# Patient Record
Sex: Female | Born: 1955 | Race: Black or African American | Hispanic: No | Marital: Married | Smoking: Never smoker
Health system: Southern US, Community
[De-identification: ages and names within clinical notes are randomized; demographics above are authoritative.]

## PROBLEM LIST (undated history)

## (undated) DIAGNOSIS — I639 Cerebral infarction, unspecified: Secondary | ICD-10-CM

## (undated) DIAGNOSIS — I5042 Chronic combined systolic (congestive) and diastolic (congestive) heart failure: Secondary | ICD-10-CM

## (undated) DIAGNOSIS — I10 Essential (primary) hypertension: Secondary | ICD-10-CM

## (undated) DIAGNOSIS — N184 Chronic kidney disease, stage 4 (severe): Secondary | ICD-10-CM

## (undated) DIAGNOSIS — K219 Gastro-esophageal reflux disease without esophagitis: Secondary | ICD-10-CM

## (undated) DIAGNOSIS — E119 Type 2 diabetes mellitus without complications: Secondary | ICD-10-CM

## (undated) DIAGNOSIS — Z973 Presence of spectacles and contact lenses: Secondary | ICD-10-CM

## (undated) DIAGNOSIS — E785 Hyperlipidemia, unspecified: Secondary | ICD-10-CM

## (undated) DIAGNOSIS — R011 Cardiac murmur, unspecified: Secondary | ICD-10-CM

## (undated) DIAGNOSIS — D649 Anemia, unspecified: Secondary | ICD-10-CM

## (undated) DIAGNOSIS — E113599 Type 2 diabetes mellitus with proliferative diabetic retinopathy without macular edema, unspecified eye: Secondary | ICD-10-CM

## (undated) DIAGNOSIS — H409 Unspecified glaucoma: Secondary | ICD-10-CM

## (undated) DIAGNOSIS — I2510 Atherosclerotic heart disease of native coronary artery without angina pectoris: Secondary | ICD-10-CM

## (undated) DIAGNOSIS — N183 Chronic kidney disease, stage 3 (moderate): Secondary | ICD-10-CM

## (undated) DIAGNOSIS — I509 Heart failure, unspecified: Secondary | ICD-10-CM

## (undated) DIAGNOSIS — H269 Unspecified cataract: Secondary | ICD-10-CM

## (undated) HISTORY — PX: CORONARY ANGIOPLASTY: SHX604

## (undated) HISTORY — DX: Hyperlipidemia, unspecified: E78.5

## (undated) HISTORY — DX: Unspecified glaucoma: H40.9

## (undated) HISTORY — DX: Unspecified cataract: H26.9

## (undated) HISTORY — PX: CORONARY ANGIOPLASTY WITH STENT PLACEMENT: SHX49

## (undated) HISTORY — PX: MULTIPLE TOOTH EXTRACTIONS: SHX2053

## (undated) HISTORY — PX: EYE SURGERY: SHX253

## (undated) HISTORY — DX: Cerebral infarction, unspecified: I63.9

## (undated) HISTORY — PX: COLONOSCOPY W/ BIOPSIES AND POLYPECTOMY: SHX1376

---

## 1978-08-10 HISTORY — PX: TUBAL LIGATION: SHX77

## 1997-08-10 HISTORY — PX: VAGINAL HYSTERECTOMY: SUR661

## 2011-05-15 ENCOUNTER — Other Ambulatory Visit (HOSPITAL_COMMUNITY): Payer: Self-pay | Admitting: Internal Medicine

## 2011-05-15 DIAGNOSIS — Z1231 Encounter for screening mammogram for malignant neoplasm of breast: Secondary | ICD-10-CM

## 2011-05-25 ENCOUNTER — Ambulatory Visit (HOSPITAL_COMMUNITY)
Admission: RE | Admit: 2011-05-25 | Discharge: 2011-05-25 | Disposition: A | Discharge status: Home / Self Care | Payer: Self-pay | Source: Ambulatory Visit | Attending: Internal Medicine | Admitting: Internal Medicine

## 2011-05-25 DIAGNOSIS — Z1231 Encounter for screening mammogram for malignant neoplasm of breast: Secondary | ICD-10-CM

## 2011-07-03 ENCOUNTER — Other Ambulatory Visit: Payer: Self-pay | Admitting: Internal Medicine

## 2011-07-03 DIAGNOSIS — R928 Other abnormal and inconclusive findings on diagnostic imaging of breast: Secondary | ICD-10-CM

## 2011-07-07 ENCOUNTER — Other Ambulatory Visit: Payer: Self-pay | Admitting: Internal Medicine

## 2011-07-07 DIAGNOSIS — R928 Other abnormal and inconclusive findings on diagnostic imaging of breast: Secondary | ICD-10-CM

## 2011-07-13 ENCOUNTER — Ambulatory Visit
Admission: RE | Admit: 2011-07-13 | Discharge: 2011-07-13 | Disposition: A | Discharge status: Home / Self Care | Payer: No Typology Code available for payment source | Source: Ambulatory Visit | Attending: Internal Medicine | Admitting: Internal Medicine

## 2011-07-13 DIAGNOSIS — R928 Other abnormal and inconclusive findings on diagnostic imaging of breast: Secondary | ICD-10-CM

## 2011-11-01 ENCOUNTER — Emergency Department (HOSPITAL_COMMUNITY)
Admission: EM | Admit: 2011-11-01 | Discharge: 2011-11-01 | Disposition: A | Discharge status: Home / Self Care | Payer: Self-pay | Attending: Emergency Medicine | Admitting: Emergency Medicine

## 2011-11-01 ENCOUNTER — Encounter (HOSPITAL_COMMUNITY): Payer: Self-pay

## 2011-11-01 DIAGNOSIS — R079 Chest pain, unspecified: Secondary | ICD-10-CM | POA: Insufficient documentation

## 2011-11-01 DIAGNOSIS — L02219 Cutaneous abscess of trunk, unspecified: Secondary | ICD-10-CM | POA: Insufficient documentation

## 2011-11-01 DIAGNOSIS — I10 Essential (primary) hypertension: Secondary | ICD-10-CM | POA: Insufficient documentation

## 2011-11-01 DIAGNOSIS — E119 Type 2 diabetes mellitus without complications: Secondary | ICD-10-CM | POA: Insufficient documentation

## 2011-11-01 DIAGNOSIS — L0291 Cutaneous abscess, unspecified: Secondary | ICD-10-CM

## 2011-11-01 DIAGNOSIS — L03319 Cellulitis of trunk, unspecified: Secondary | ICD-10-CM | POA: Insufficient documentation

## 2011-11-01 HISTORY — DX: Essential (primary) hypertension: I10

## 2011-11-01 MED ORDER — SULFAMETHOXAZOLE-TRIMETHOPRIM 800-160 MG PO TABS: 1.0000 | ORAL_TABLET | Freq: Two times a day (BID) | ORAL | Status: AC

## 2011-11-01 MED ORDER — LIDOCAINE-EPINEPHRINE (PF) 1 %-1:200000 IJ SOLN
INTRAMUSCULAR | Status: AC
  Administered 2011-11-01: 18:00:00
  Filled 2011-11-01: qty 10

## 2011-11-01 MED ORDER — OXYCODONE-ACETAMINOPHEN 5-325 MG PO TABS: 2.0000 | ORAL_TABLET | ORAL | Status: AC | PRN

## 2011-11-01 NOTE — Discharge Instructions (Signed)
Remove the packing in 2 days--return here for any problems  Abscess An abscess (boil or furuncle) is an infected area that contains a collection of pus.  SYMPTOMS Signs and symptoms of an abscess include pain, tenderness, redness, or hardness. You may feel a moveable soft area under your skin. An abscess can occur anywhere in the body.  TREATMENT  A surgical cut (incision) may be made over your abscess to drain the pus. Gauze may be packed into the space or a drain may be looped through the abscess cavity (pocket). This provides a drain that will allow the cavity to heal from the inside outwards. The abscess may be painful for a few days, but should feel much better if it was drained.  Your abscess, if seen early, may not have localized and may not have been drained. If not, another appointment may be required if it does not get better on its own or with medications. HOME CARE INSTRUCTIONS   Only take over-the-counter or prescription medicines for pain, discomfort, or fever as directed by your caregiver.   Take your antibiotics as directed if they were prescribed. Finish them even if you start to feel better.   Keep the skin and clothes clean around your abscess.   If the abscess was drained, you will need to use gauze dressing to collect any draining pus. Dressings will typically need to be changed 3 or more times a day.   The infection may spread by skin contact with others. Avoid skin contact as much as possible.   Practice good hygiene. This includes regular hand washing, cover any draining skin lesions, and do not share personal care items.   If you participate in sports, do not share athletic equipment, towels, whirlpools, or personal care items. Shower after every practice or tournament.   If a draining area cannot be adequately covered:   Do not participate in sports.   Children should not participate in day care until the wound has healed or drainage stops.   If your caregiver  has given you a follow-up appointment, it is very important to keep that appointment. Not keeping the appointment could result in a much worse infection, chronic or permanent injury, pain, and disability. If there is any problem keeping the appointment, you must call back to this facility for assistance.  SEEK MEDICAL CARE IF:   You develop increased pain, swelling, redness, drainage, or bleeding in the wound site.   You develop signs of generalized infection including muscle aches, chills, fever, or a general ill feeling.   You have an oral temperature above 102 F (38.9 C).  MAKE SURE YOU:   Understand these instructions.   Will watch your condition.   Will get help right away if you are not doing well or get worse.  Document Released: 05/06/2005 Document Revised: 07/16/2011 Document Reviewed: 02/28/2008 [facility] Patient Information 2012 [place].

## 2011-11-01 NOTE — ED Notes (Signed)
Patient reports abcess w/o drainage on chest at sternum x 4 days. Reports hx of same

## 2011-11-01 NOTE — ED Provider Notes (Signed)
History     CSN: [id number]  Arrival date & time 11/01/11  1537   First MD Initiated Contact with Patient 11/01/11 1642      Chief Complaint  Patient presents with  . Wound Infection    (Consider location/radiation/quality/duration/timing/severity/associated sxs/prior treatment) The history is provided by the patient.   Patient here with subxphoid wound abscess without drainage x4 days. No fever. No treatment done prior to arrival. Pain described as sharp and worse with movement. History of same in past.  Past Medical History  Diagnosis Date  . Diabetes mellitus   . Hypertension     History reviewed. No pertinent past surgical history.  No family history on file.  History  Substance Use Topics  . Smoking status: Not on file  . Smokeless tobacco: Not on file  . Alcohol Use:     OB History    Grav Para Term Preterm Abortions TAB SAB Ect Mult Living                  Review of Systems  All other systems reviewed and are negative.    Allergies  Review of patient's allergies indicates not on file.  Home Medications  No current outpatient prescriptions on file.  BP 193/86  Pulse 89  Temp(Src) 98.2 F (36.8 C) (Oral)  Resp 18  SpO2 99%  Physical Exam  Nursing note and vitals reviewed. Constitutional: She is oriented to person, place, and time. She appears well-developed and well-nourished.  Non-toxic appearance. No distress.  HENT:  Head: Normocephalic and atraumatic.  Eyes: Conjunctivae, EOM and lids are normal. Pupils are equal, round, and reactive to light.  Neck: Normal range of motion. Neck supple. No tracheal deviation present. No mass present.  Cardiovascular: Normal rate, regular rhythm and normal heart sounds.  Exam reveals no gallop.   No murmur heard. Pulmonary/Chest: Effort normal and breath sounds normal. No stridor. No respiratory distress. She has no decreased breath sounds. She has no wheezes. She has no rhonchi. She has no rales.       6cm  circular abscess to subxphoid with erythema and soft center without drainage with some surrounding erythema  Abdominal: Soft. Normal appearance and bowel sounds are normal. She exhibits no distension. There is no tenderness. There is no rebound and no CVA tenderness.  Musculoskeletal: Normal range of motion. She exhibits no edema and no tenderness.  Neurological: She is alert and oriented to person, place, and time. She has normal strength. No cranial nerve deficit or sensory deficit. GCS eye subscore is 4. GCS verbal subscore is 5. GCS motor subscore is 6.  Skin: Skin is warm and dry. No abrasion and no rash noted.  Psychiatric: She has a normal mood and affect. Her speech is normal and behavior is normal.    ED Course  INCISION AND DRAINAGE Date/Time: 11/01/2011 5:18 PM Performed by: Leota Jacobsen Authorized by: Lacretia Leigh T Consent: Verbal consent obtained. Risks and benefits: risks, benefits and alternatives were discussed Consent given by: patient Patient identity confirmed: verbally with patient Time out: Immediately prior to procedure a "time out" was called to verify the correct patient, procedure, equipment, support staff and site/side marked as required. Type: abscess Body area: trunk Location details: chest Anesthesia: local infiltration Local anesthetic: lidocaine 2% with epinephrine Anesthetic total: 10 ml Patient sedated: no Scalpel size: 10 Incision type: single straight Complexity: complex Drainage: purulent Drainage amount: copious Wound treatment: wound left open Packing material: 1/4 in iodoform gauze Patient tolerance:  Patient tolerated the procedure well with no immediate complications.   (including critical care time)  Labs Reviewed - No data to display No results found.   No diagnosis found.    MDM  Pt to have tetanus status updated and will be given rx for pain meds, abx, and f/u instructions        Leota Jacobsen, MD 11/01/11  1720

## 2012-01-26 ENCOUNTER — Other Ambulatory Visit: Payer: Self-pay | Admitting: Internal Medicine

## 2012-01-26 DIAGNOSIS — N6009 Solitary cyst of unspecified breast: Secondary | ICD-10-CM

## 2012-02-02 ENCOUNTER — Other Ambulatory Visit: Payer: Self-pay

## 2012-02-16 ENCOUNTER — Ambulatory Visit
Admission: RE | Admit: 2012-02-16 | Discharge: 2012-02-16 | Disposition: A | Discharge status: Home / Self Care | Payer: Self-pay | Source: Ambulatory Visit | Attending: Internal Medicine | Admitting: Internal Medicine

## 2012-02-16 DIAGNOSIS — N6009 Solitary cyst of unspecified breast: Secondary | ICD-10-CM

## 2013-02-03 ENCOUNTER — Encounter (HOSPITAL_COMMUNITY): Payer: Self-pay | Admitting: *Deleted

## 2013-02-03 ENCOUNTER — Emergency Department (HOSPITAL_COMMUNITY)
Admission: EM | Admit: 2013-02-03 | Discharge: 2013-02-03 | Disposition: A | Discharge status: Home / Self Care | Payer: BC Managed Care – PPO | Source: Home / Self Care | Attending: Family Medicine | Admitting: Family Medicine

## 2013-02-03 ENCOUNTER — Emergency Department (INDEPENDENT_AMBULATORY_CARE_PROVIDER_SITE_OTHER): Payer: BC Managed Care – PPO

## 2013-02-03 DIAGNOSIS — M545 Low back pain, unspecified: Secondary | ICD-10-CM

## 2013-02-03 DIAGNOSIS — IMO0001 Reserved for inherently not codable concepts without codable children: Secondary | ICD-10-CM

## 2013-02-03 DIAGNOSIS — R0602 Shortness of breath: Secondary | ICD-10-CM

## 2013-02-03 DIAGNOSIS — IMO0002 Reserved for concepts with insufficient information to code with codable children: Secondary | ICD-10-CM

## 2013-02-03 DIAGNOSIS — E1165 Type 2 diabetes mellitus with hyperglycemia: Secondary | ICD-10-CM

## 2013-02-03 DIAGNOSIS — N390 Urinary tract infection, site not specified: Secondary | ICD-10-CM

## 2013-02-03 LAB — POCT I-STAT, CHEM 8
BUN: 10 mg/dL (ref 6–23)
Calcium, Ion: 1.19 mmol/L (ref 1.12–1.23)
HCT: 42 % (ref 36.0–46.0)
Sodium: 139 mEq/L (ref 135–145)
TCO2: 28 mmol/L (ref 0–100)

## 2013-02-03 LAB — POCT URINALYSIS DIP (DEVICE)
Bilirubin Urine: NEGATIVE
Nitrite: NEGATIVE
Specific Gravity, Urine: 1.015 (ref 1.005–1.030)
Urobilinogen, UA: 0.2 mg/dL (ref 0.0–1.0)
pH: 6 (ref 5.0–8.0)

## 2013-02-03 MED ORDER — LISINOPRIL-HYDROCHLOROTHIAZIDE 20-12.5 MG PO TABS: 2.0000 | ORAL_TABLET | Freq: Every day | ORAL | Status: DC

## 2013-02-03 MED ORDER — METFORMIN HCL 1000 MG PO TABS: 500.0000 mg | ORAL_TABLET | Freq: Two times a day (BID) | ORAL | Status: DC

## 2013-02-03 MED ORDER — TRAMADOL HCL 50 MG PO TABS: 50.0000 mg | ORAL_TABLET | Freq: Four times a day (QID) | ORAL | Status: DC | PRN

## 2013-02-03 MED ORDER — SULFAMETHOXAZOLE-TRIMETHOPRIM 800-160 MG PO TABS: 1.0000 | ORAL_TABLET | Freq: Two times a day (BID) | ORAL | Status: DC

## 2013-02-03 MED ORDER — METFORMIN HCL 1000 MG PO TABS: 1000.0000 mg | ORAL_TABLET | Freq: Two times a day (BID) | ORAL | Status: DC

## 2013-02-03 NOTE — Discharge Instructions (Signed)
[place]: [place]  [phone]  [place] [facility]: [place]   [phone]  [facility]: [address] [place]  [phone]  [place] [facility] : [address] [place] [phone]  [facility]: [address] [place] [phone]  [facility]: [place] [phone]  Dr. Blanchie Serve [place]  [phone]  Dr. Benito Mccreedy, [facility]. [place], [place] [address]  [phone]    Go to www.goodrx.com to look up your medications. This will give you a list of where you can find your prescriptions at the most affordable prices.   If you have no primary doctor, here are some resources that may be helpful:  [place] Providers: - [facility]- [address]  [phone];   - [place], [place] [phone]  - [place], [address] [phone] - [place]  [phone]  - Lucianne Lei- [address], [address] [phone]  Only accepts [place] patients       after they have her name applied to their card  -Dr. Benito Mccreedy, [facility]. [place]    [place], [place] [address]  [phone]  Self Pay (no insurance) in [place]: - Sickle Cell Patients: Dr Kevan Ny, [facility] [place]  - Physician Referral Service- [facility]- [address], [address], [place], [phone];  Monday to Friday, 9 a.m. - 7 p.m.; Saturday 9 a.m. to 1 p.m.  [facility]- [address] [place], [place]      [facility]- [address] [phone]  -[facility], [place]; [phone]; or [address], [place]; [phone].   [employer] [facility] [place], [address] [place] [address] [place]; [id number]; Monday to Wednesday, 8:30 a.m. - 5 p.m.; Thursday, 8:30 a.m. - 8 p.m.  [facility], [place], [place]; [id number]; Monday to Friday, 8 a.m. - 4:30 p.m.   [facility], [place] [address], [place], [place]; first and third Saturday of the month, 9:30 a.m. - 12:30 p.m.  [facility], [address], [place], [place]; second Saturday of the month, 9 a.m. -noon.  [place] for children. For information, call [phone]; [phone]; or [phone].  Other agencies that provide inexpensive medical care:     [name]  [facility]  [phone]    [facility]  [phone] [place]    [facility]  [phone]    [facility]  [phone], [phone]; or [phone].  Chronic Pain Problems Contact [facility]  [phone] Patients need to be referred by their primary care doctor.  [facility] [place]  [place] [address] [place]   [phone] (After Hours)  General Information: Finding a doctor when you do not have health insurance can be tricky. Although you are not limited by an insurance plan, you are of course limited by her finances  and how much but he can pay out of pocket.  What are your options if you don't have health insurance?   1) Find a Tax adviser and Pay Out of [place] Although you won't have to find out who is covered by your insurance plan, it is a good idea to ask around and get recommendations. You will then need to call the office and see if the doctor you have chosen will accept you as a new patient and what types of options they offer for patients who are self-pay. Some doctors offer discounts or will set up payment plans for their patients who do not have insurance, but you will need to ask so you aren't surprised when you get to your appointment.  2) Contact Your Local Health Department Not all health departments have doctors that can see patients for sick visits, but many do, so it is worth a call to see if yours does. If you don't know where your local health department is, you can check in your phone book. The CDC also has a tool to help you locate your state's health department, and many state websites also have listings of all of their local health departments.  3) Find a [facility] If your illness is not likely to be very severe or complicated, you may want to try a walk in clinic. These are popping up all over the country in pharmacies, drugstores, and shopping centers. They're usually staffed by nurse practitioners or physician assistants that have been trained to treat common illnesses and complaints. They're usually fairly quick and inexpensive. However, if you have serious medical issues or chronic medical problems, these are probably not your best option

## 2013-02-03 NOTE — ED Provider Notes (Signed)
History    CSN: [id number] Arrival date & time 02/03/13  1021  First MD Initiated Contact with Patient 02/03/13 1050     Chief Complaint  Patient presents with  . 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 His low back pain has been going on for a couple of months and seems to be getting a bit worse. The pain is confined to the lower back and does not radiate down the legs. She does endorse a bit of intermittent feet swelling after a long day of work. She also admits to some burning with urination in the past 2 weeks. The shortness of breath as described more as heavy breathing and is worse with laying down. She says it is worse when she drinks some water before she goes to bed. She denies ever waking up in the middle of the night short of breath. Denies any history of heart failure. None of her problems have changed or gotten any worse in the last couple of weeks, she states she has just gotten tired of the pain in her shoulders and back  Patient is a 57 y.o. female presenting with back pain.  Back Pain Associated symptoms: dysuria and numbness (has some intermittent numbness in her toes after prolonged standing)   Associated symptoms: no abdominal pain, no chest pain, no fever and no weakness    Past Medical History  Diagnosis Date  . Diabetes mellitus   . Hypertension    History reviewed. No pertinent past surgical history. History reviewed. No pertinent family history. History  Substance Use Topics  . Smoking status: Never Smoker   . Smokeless tobacco: Not on file  . Alcohol Use: No   OB History   Grav Para Term Preterm Abortions TAB SAB Ect Mult  Living                 Review of Systems  Constitutional: Negative for fever and chills.  Eyes: Negative for visual disturbance.  Respiratory: Positive for shortness of breath. Negative for cough, choking and chest tightness.   Cardiovascular: Positive for leg swelling. Negative for chest pain and palpitations.       History of heart murmur  Gastrointestinal: Negative for nausea, vomiting, abdominal pain, diarrhea and blood in stool.  Endocrine: Positive for polydipsia and polyuria.  Genitourinary: Positive for dysuria. Negative for urgency and frequency.  Musculoskeletal: Positive for back pain. Negative for myalgias and arthralgias.  Skin: Negative for rash.  Neurological: Positive for numbness (has some intermittent numbness in her toes after prolonged standing). Negative for dizziness, weakness and light-headedness.    Allergies  Review of patient's allergies indicates no known allergies.  Home Medications   Current Outpatient Rx  Name  Route  Sig  Dispense  Refill  . OVER THE COUNTER MEDICATION   Apply externally   Apply 1 application topically at bedtime. Apply to boil nightly at bedtime         . sulfamethoxazole-trimethoprim (SEPTRA DS) 800-160 MG per tablet   Oral   Take 1 tablet by mouth every 12 (twelve) hours.   14 tablet   0   . traMADol (ULTRAM) 50 MG tablet   Oral   Take 1 tablet (50 mg total) by mouth  every 6 (six) hours as needed for pain.   30 tablet   0    BP 202/81  Pulse 78  Temp(Src) 98.6 F (37 C) (Oral)  Resp 14  SpO2 99% Physical Exam  Nursing note and vitals reviewed. Constitutional: She is oriented to person, place, and time. Vital signs are normal. She appears well-developed and well-nourished. No distress.  HENT:  Head: Atraumatic.  Eyes: EOM are normal. Pupils are equal, round, and reactive to light.  Cardiovascular: Normal rate and regular rhythm.  Exam reveals no gallop and no friction rub.   Murmur heard.  Decrescendo systolic  murmur is present with a grade of 1/6  Grade 1/6 early decrescendo murmur loudest in the left upper sternal border second intercostal status, nonradiating  Pulmonary/Chest: Effort normal and breath sounds normal. No respiratory distress. She has no wheezes. She has no rales.  Abdominal: Soft. There is no tenderness.  Musculoskeletal: Normal range of motion. She exhibits no edema.       Lumbar back: She exhibits normal range of motion, no tenderness, no bony tenderness, no deformity and no spasm.  Neurological: She is alert and oriented to person, place, and time. She has normal strength.  Skin: Skin is warm and dry. She is not diaphoretic.  Psychiatric: She has a normal mood and affect. Her behavior is normal. Judgment normal.    ED Course  Procedures (including critical care time) Labs Reviewed  POCT URINALYSIS DIP (DEVICE) - Abnormal; Notable for the following:    Glucose, UA 500 (*)    Hgb urine dipstick TRACE (*)    Leukocytes, UA TRACE (*)    All other components within normal limits  POCT I-STAT, CHEM 8 - Abnormal; Notable for the following:    Glucose, Bld 378 (*)    All other components within normal limits  URINE CULTURE   Dg Chest 2 View  02/03/2013   *RADIOLOGY REPORT*  Clinical Data: Back pain.  CHEST - 2 VIEW  Comparison: None.  Findings: The heart size and pulmonary vascularity are normal and the lungs are clear.  No acute osseous abnormality.  IMPRESSION: Normal exam.   Original Report Authenticated By: Lorriane Shire, M.D.   1. Lumbago   2. UTI (lower urinary tract infection)   3. Diabetes mellitus type II, uncontrolled     MDM  The back and shoulder pain seems to be primarily musculoskeletal, although she does have some symptoms of a mild lower urinary tract infection so this will be treated with Bactrim. She'll followup with her primary care physician control of her diabetes and hypertension. She took her antihypertensive medication only when she was on the way here  today which may explain the high blood pressure reading, with she will take this up with primary care physician              . sulfamethoxazole-trimethoprim (SEPTRA DS) 800-160 MG per tablet    Sig: Take 1 tablet by mouth every 12 (twelve) hours.    Dispense:  14 tablet    Refill:  0  . traMADol (ULTRAM) 50 MG tablet    Sig: Take 1 tablet (50 mg total) by mouth every 6 (six) hours as needed for pain.    Dispense:  30 tablet    Refill:  0     Liam Graham, PA-C 02/03/13 1158

## 2013-02-03 NOTE — ED Notes (Signed)
Pt  Reports    Low  Back  Pain       For  sev  Weeks      denys  Any  Injury however  Reports    Some  Frequency  Of  Urine  As  Well          Pt is  Diabetic  Who  Also  Has  History  Of  Hypertension as  Well    She  Reports  She  Took  Her  meds  today

## 2013-02-04 LAB — URINE CULTURE

## 2013-02-04 NOTE — ED Provider Notes (Signed)
Medical screening examination/treatment/procedure(s) were performed by non-physician practitioner and as supervising physician I was immediately available for consultation/collaboration.   [facility]; MD  Randa Spike, MD 02/04/13 [phone]

## 2013-04-12 ENCOUNTER — Emergency Department (HOSPITAL_COMMUNITY)
Admission: EM | Admit: 2013-04-12 | Discharge: 2013-04-12 | Disposition: A | Discharge status: Home / Self Care | Payer: BC Managed Care – PPO | Attending: Emergency Medicine | Admitting: Emergency Medicine

## 2013-04-12 ENCOUNTER — Encounter (HOSPITAL_COMMUNITY): Payer: Self-pay | Admitting: Emergency Medicine

## 2013-04-12 DIAGNOSIS — Z794 Long term (current) use of insulin: Secondary | ICD-10-CM | POA: Insufficient documentation

## 2013-04-12 DIAGNOSIS — I10 Essential (primary) hypertension: Secondary | ICD-10-CM | POA: Insufficient documentation

## 2013-04-12 DIAGNOSIS — E119 Type 2 diabetes mellitus without complications: Secondary | ICD-10-CM | POA: Insufficient documentation

## 2013-04-12 DIAGNOSIS — R000 Tachycardia, unspecified: Secondary | ICD-10-CM | POA: Insufficient documentation

## 2013-04-12 DIAGNOSIS — R5381 Other malaise: Secondary | ICD-10-CM | POA: Insufficient documentation

## 2013-04-12 DIAGNOSIS — R52 Pain, unspecified: Secondary | ICD-10-CM | POA: Insufficient documentation

## 2013-04-12 DIAGNOSIS — R739 Hyperglycemia, unspecified: Secondary | ICD-10-CM

## 2013-04-12 DIAGNOSIS — Z79899 Other long term (current) drug therapy: Secondary | ICD-10-CM | POA: Insufficient documentation

## 2013-04-12 DIAGNOSIS — R112 Nausea with vomiting, unspecified: Secondary | ICD-10-CM | POA: Insufficient documentation

## 2013-04-12 DIAGNOSIS — R630 Anorexia: Secondary | ICD-10-CM | POA: Insufficient documentation

## 2013-04-12 LAB — GLUCOSE, CAPILLARY
Glucose-Capillary: 319 mg/dL — ABNORMAL HIGH (ref 70–99)
Glucose-Capillary: 236 mg/dL — ABNORMAL HIGH (ref 70–99)

## 2013-04-12 LAB — CBC
MCV: 73.5 fL — ABNORMAL LOW (ref 78.0–100.0)
Platelets: 383 10*3/uL (ref 150–400)
RBC: 5.36 MIL/uL — ABNORMAL HIGH (ref 3.87–5.11)
RDW: 12.9 % (ref 11.5–15.5)
WBC: 8.1 10*3/uL (ref 4.0–10.5)

## 2013-04-12 LAB — COMPREHENSIVE METABOLIC PANEL
ALT: 9 U/L (ref 0–35)
AST: 11 U/L (ref 0–37)
Albumin: 3.7 g/dL (ref 3.5–5.2)
Alkaline Phosphatase: 121 U/L — ABNORMAL HIGH (ref 39–117)
CO2: 30 mEq/L (ref 19–32)
Chloride: 93 mEq/L — ABNORMAL LOW (ref 96–112)
GFR calc non Af Amer: 66 mL/min — ABNORMAL LOW (ref >90)
Potassium: 3.8 mEq/L (ref 3.5–5.1)
Sodium: 134 mEq/L — ABNORMAL LOW (ref 135–145)
Total Bilirubin: 0.3 mg/dL (ref 0.3–1.2)

## 2013-04-12 LAB — URINALYSIS, ROUTINE W REFLEX MICROSCOPIC
Glucose, UA: >1000 mg/dL — AB
Hgb urine dipstick: NEGATIVE
Ketones, ur: NEGATIVE mg/dL
Protein, ur: 30 mg/dL — AB
Urobilinogen, UA: 1 mg/dL (ref 0.0–1.0)

## 2013-04-12 LAB — URINE MICROSCOPIC-ADD ON

## 2013-04-12 MED ORDER — SODIUM CHLORIDE 0.9 % IV BOLUS (SEPSIS)
1000.0000 mL | Freq: Once | INTRAVENOUS | Status: AC
  Administered 2013-04-12: 1000 mL via INTRAVENOUS

## 2013-04-12 MED ORDER — ONDANSETRON HCL 4 MG/2ML IJ SOLN
4.0000 mg | Freq: Once | INTRAMUSCULAR | Status: AC
  Administered 2013-04-12: 4 mg via INTRAVENOUS
  Filled 2013-04-12: qty 2

## 2013-04-12 MED ORDER — ONDANSETRON HCL 4 MG PO TABS: 4.0000 mg | ORAL_TABLET | Freq: Four times a day (QID) | ORAL | Status: DC

## 2013-04-12 NOTE — Progress Notes (Signed)
Patient confirms her pcp is Dr. Lucianne Lei.

## 2013-04-12 NOTE — ED Notes (Signed)
Pt reports having generalized body aches, weakness, and nausea that started a few days ago, which worsened today. Pt reports not having an appetite and is concerned her medications may be causing her symptoms. Pt is A/O x4 and in NAD.

## 2013-04-12 NOTE — ED Notes (Signed)
Pt is awake and alert, pleasant and cooperative. Discharge vitals 143/89 HR 88 RR 16 and unlabored. Pt has advised to follow up with PCP. Will continue to monitor for safety. Patient escorted to lobby without incident. T.Bobby Rumpf RN

## 2013-04-12 NOTE — ED Provider Notes (Signed)
CSN: [id number]     Arrival date & time 04/12/13  1747 History   First MD Initiated Contact with Patient 04/12/13 1900     Chief Complaint  Patient presents with  . Nausea  . Weakness   (Consider location/radiation/quality/duration/timing/severity/associated sxs/prior Treatment) HPI Comments: 57 year old female with a past medical history of diabetes and hypertension presents to the emergency department complaining of generalized body aches, weakness and nausea beginning today while at work. Patient states she woke up this morning, took her blood sugar which was 125, went to work, had some fruit for breakfast, and shortly after began feeling nauseated, ate a few crackers, vomited once and continued to be nauseous. Has not vomited since, however has not tried to eat anything else. She has not had a chance to check her blood sugar since this morning. States 2 weeks ago she was put on a new medication Victoza for her diabetes, and since then she has had a decreased appetite with some weakness. Denies abdominal pain, fever, chills, chest pain, shortness of breath.  Patient is a 57 y.o. female presenting with weakness. The history is provided by the patient.  Weakness Associated symptoms include nausea, vomiting and weakness. Pertinent negatives include no abdominal pain, chills, fever or headaches.    Past Medical History  Diagnosis Date  . Diabetes mellitus   . Hypertension    History reviewed. No pertinent past surgical history. No family history on file. History  Substance Use Topics  . Smoking status: Never Smoker   . Smokeless tobacco: Never Used  . Alcohol Use: No   OB History   Grav Para Term Preterm Abortions TAB SAB Ect Mult Living                 Review of Systems  Constitutional: Positive for appetite change. Negative for fever and chills.  Gastrointestinal: Positive for nausea and vomiting. Negative for abdominal pain.  Neurological: Positive for weakness. Negative for  dizziness, light-headedness and headaches.  Psychiatric/Behavioral: Negative for confusion.  All other systems reviewed and are negative.    Allergies  Review of patient's allergies indicates no known allergies.  Home Medications   Current Outpatient Rx  Name  Route  Sig  Dispense  Refill  . acetaminophen (TYLENOL) 500 MG tablet   Oral   Take 500 mg by mouth every 6 (six) hours as needed for pain.         Marland Kitchen BIOTIN PO   Oral   Take 1 tablet by mouth daily.         . insulin NPH-regular (NOVOLIN 70/30) (70-30) 100 UNIT/ML injection   Subcutaneous   Inject 25 Units into the skin daily with breakfast. 25 units in the morning and 20 units in the evening         . Liraglutide (VICTOZA) 18 MG/3ML SOPN   Subcutaneous   Inject 1.8 mg into the skin daily.         Marland Kitchen lisinopril-hydrochlorothiazide (PRINZIDE,ZESTORETIC) 20-12.5 MG per tablet   Oral   Take 1 tablet by mouth daily.         . metFORMIN (GLUCOPHAGE) 1000 MG tablet   Oral   Take 1,000 mg by mouth 2 (two) times daily with a meal.         . traMADol (ULTRAM) 50 MG tablet   Oral   Take 50 mg by mouth every 6 (six) hours as needed for pain.          BP 157/96  Pulse  103  Temp(Src) 99.7 F (37.6 C) (Oral)  Resp 20  SpO2 98% Physical Exam  Nursing note and vitals reviewed. Constitutional: She is oriented to person, place, and time. She appears well-developed and well-nourished. No distress.  HENT:  Head: Normocephalic and atraumatic.  Mouth/Throat: Oropharynx is clear and moist.  Eyes: Conjunctivae and EOM are normal. Pupils are equal, round, and reactive to light. No scleral icterus.  Neck: Normal range of motion. Neck supple.  Cardiovascular: Regular rhythm, normal heart sounds and intact distal pulses.  Tachycardia present.   Mild tachycardia  Pulmonary/Chest: Effort normal and breath sounds normal.  Abdominal: Soft. Normal appearance and bowel sounds are normal. She exhibits no distension and no  mass. There is generalized tenderness (mild). There is no rigidity, no rebound, no guarding and negative Murphy's sign.  Musculoskeletal: Normal range of motion. She exhibits no edema.  Neurological: She is alert and oriented to person, place, and time.  Skin: Skin is warm and dry. She is not diaphoretic.  Psychiatric: She has a normal mood and affect. Her behavior is normal.    ED Course  Procedures (including critical care time) Labs Review Labs Reviewed  CBC - Abnormal; Notable for the following:    RBC 5.36 (*)    MCV 73.5 (*)    MCH 24.8 (*)    All other components within normal limits  COMPREHENSIVE METABOLIC PANEL - Abnormal; Notable for the following:    Sodium 134 (*)    Chloride 93 (*)    Glucose, Bld 299 (*)    Alkaline Phosphatase 121 (*)    GFR calc non Af Amer 66 (*)    GFR calc Af Amer 77 (*)    All other components within normal limits  URINALYSIS, ROUTINE W REFLEX MICROSCOPIC - Abnormal; Notable for the following:    APPearance CLOUDY (*)    Glucose, UA >1000 (*)    Bilirubin Urine MODERATE (*)    Protein, ur 30 (*)    Leukocytes, UA SMALL (*)    All other components within normal limits  GLUCOSE, CAPILLARY - Abnormal; Notable for the following:    Glucose-Capillary 319 (*)    All other components within normal limits  URINE MICROSCOPIC-ADD ON - Abnormal; Notable for the following:    Squamous Epithelial / LPF FEW (*)    Bacteria, UA MANY (*)    Casts HYALINE CASTS (*)    All other components within normal limits  GLUCOSE, CAPILLARY - Abnormal; Notable for the following:    Glucose-Capillary 236 (*)    All other components within normal limits  URINE CULTURE   Imaging Review No results found.  MDM   1. Hyperglycemia   2. Nausea and vomiting      Patient with nausea and an episode of vomiting today while at work. She is well appearing and in no apparent distress. Mild tachycardia at 103. Slight fever of 99.7. Abdomen is mildly tender throughout,  no peritoneal signs. Has had a decreased appetite over the past 2 weeks since beginning Victoza and beginning to feel weak. Will give IV fluids and Zofran. Labs pending- CBC, CMP and UA. CBG 319.  Illene Labrador, PA-C 04/12/13 1943  9:34 PM Patient reports nausea has subsided. Glucose on CMP 299. Alk phos 121. Still feeling weak, has only received 1/4 of IVF at this point. Will continue fluids, recheck cbg and re-assess. She is currently drinking water, tolerating well. Awaiting UA.  11:19 PM Patient reports she is feeling much  better. Abdomen soft and non-tender. She is tolerating PO. CBG decreased to 236. She is stable for discharge. UA nitrite negative, culture pending. She will f/u with her PCP to discuss symptoms with victoza, also discussed finding of elevated alk phos. Return precautions discussed. Patient states understanding of treatment care plan and is agreeable.   Illene Labrador, PA-C 04/12/13 2322

## 2013-04-14 LAB — URINE CULTURE

## 2013-04-14 NOTE — ED Provider Notes (Signed)
Medical screening examination/treatment/procedure(s) were performed by non-physician practitioner and as supervising physician I was immediately available for consultation/collaboration.  Leota Jacobsen, MD 04/14/13 [phone]

## 2013-04-19 ENCOUNTER — Encounter: Payer: Self-pay | Admitting: Nurse Practitioner

## 2013-07-17 ENCOUNTER — Other Ambulatory Visit (HOSPITAL_COMMUNITY): Payer: Self-pay | Admitting: Family Medicine

## 2013-07-17 DIAGNOSIS — Z1231 Encounter for screening mammogram for malignant neoplasm of breast: Secondary | ICD-10-CM

## 2013-08-01 ENCOUNTER — Ambulatory Visit (HOSPITAL_COMMUNITY)
Admission: RE | Admit: 2013-08-01 | Discharge: 2013-08-01 | Disposition: A | Discharge status: Home / Self Care | Payer: BC Managed Care – PPO | Source: Ambulatory Visit | Attending: Family Medicine | Admitting: Family Medicine

## 2013-08-01 DIAGNOSIS — Z1231 Encounter for screening mammogram for malignant neoplasm of breast: Secondary | ICD-10-CM | POA: Insufficient documentation

## 2013-10-25 ENCOUNTER — Ambulatory Visit
Admission: RE | Admit: 2013-10-25 | Discharge: 2013-10-25 | Disposition: A | Discharge status: Home / Self Care | Payer: No Typology Code available for payment source | Source: Ambulatory Visit | Attending: Family Medicine | Admitting: Family Medicine

## 2013-10-25 ENCOUNTER — Other Ambulatory Visit: Payer: Self-pay | Admitting: Family Medicine

## 2013-10-25 DIAGNOSIS — R52 Pain, unspecified: Secondary | ICD-10-CM

## 2013-12-11 ENCOUNTER — Other Ambulatory Visit: Payer: Self-pay | Admitting: Orthopedic Surgery

## 2013-12-11 ENCOUNTER — Encounter (INDEPENDENT_AMBULATORY_CARE_PROVIDER_SITE_OTHER): Payer: Self-pay

## 2013-12-11 ENCOUNTER — Ambulatory Visit
Admission: RE | Admit: 2013-12-11 | Discharge: 2013-12-11 | Disposition: A | Discharge status: Home / Self Care | Payer: No Typology Code available for payment source | Source: Ambulatory Visit | Attending: Orthopedic Surgery | Admitting: Orthopedic Surgery

## 2013-12-11 DIAGNOSIS — M5430 Sciatica, unspecified side: Secondary | ICD-10-CM

## 2014-09-10 DIAGNOSIS — I639 Cerebral infarction, unspecified: Secondary | ICD-10-CM

## 2014-09-10 HISTORY — DX: Cerebral infarction, unspecified: I63.9

## 2014-09-30 ENCOUNTER — Emergency Department (HOSPITAL_COMMUNITY): Payer: 59

## 2014-09-30 ENCOUNTER — Encounter (HOSPITAL_COMMUNITY): Payer: Self-pay | Admitting: *Deleted

## 2014-09-30 ENCOUNTER — Inpatient Hospital Stay (HOSPITAL_COMMUNITY)
Admission: EM | Admit: 2014-09-30 | Discharge: 2014-10-02 | DRG: 065 | Disposition: A | Discharge status: Home / Self Care | Payer: 59 | Attending: Internal Medicine | Admitting: Internal Medicine

## 2014-09-30 DIAGNOSIS — G8194 Hemiplegia, unspecified affecting left nondominant side: Secondary | ICD-10-CM | POA: Diagnosis present

## 2014-09-30 DIAGNOSIS — E785 Hyperlipidemia, unspecified: Secondary | ICD-10-CM | POA: Diagnosis present

## 2014-09-30 DIAGNOSIS — E1165 Type 2 diabetes mellitus with hyperglycemia: Secondary | ICD-10-CM

## 2014-09-30 DIAGNOSIS — E669 Obesity, unspecified: Secondary | ICD-10-CM | POA: Diagnosis present

## 2014-09-30 DIAGNOSIS — I10 Essential (primary) hypertension: Secondary | ICD-10-CM | POA: Diagnosis present

## 2014-09-30 DIAGNOSIS — I639 Cerebral infarction, unspecified: Secondary | ICD-10-CM | POA: Diagnosis present

## 2014-09-30 DIAGNOSIS — Z79899 Other long term (current) drug therapy: Secondary | ICD-10-CM | POA: Diagnosis not present

## 2014-09-30 DIAGNOSIS — Z794 Long term (current) use of insulin: Secondary | ICD-10-CM | POA: Diagnosis not present

## 2014-09-30 DIAGNOSIS — Z833 Family history of diabetes mellitus: Secondary | ICD-10-CM

## 2014-09-30 DIAGNOSIS — IMO0002 Reserved for concepts with insufficient information to code with codable children: Secondary | ICD-10-CM | POA: Diagnosis present

## 2014-09-30 DIAGNOSIS — I161 Hypertensive emergency: Secondary | ICD-10-CM | POA: Insufficient documentation

## 2014-09-30 DIAGNOSIS — Z8673 Personal history of transient ischemic attack (TIA), and cerebral infarction without residual deficits: Secondary | ICD-10-CM | POA: Diagnosis present

## 2014-09-30 DIAGNOSIS — Z6829 Body mass index (BMI) 29.0-29.9, adult: Secondary | ICD-10-CM

## 2014-09-30 DIAGNOSIS — I6529 Occlusion and stenosis of unspecified carotid artery: Secondary | ICD-10-CM | POA: Diagnosis present

## 2014-09-30 DIAGNOSIS — R200 Anesthesia of skin: Secondary | ICD-10-CM

## 2014-09-30 LAB — BASIC METABOLIC PANEL
ANION GAP: 7 (ref 5–15)
BUN: 17 mg/dL (ref 6–23)
CALCIUM: 9.5 mg/dL (ref 8.4–10.5)
CO2: 31 mmol/L (ref 19–32)
CREATININE: 0.53 mg/dL (ref 0.50–1.10)
Chloride: 100 mmol/L (ref 96–112)
GFR calc Af Amer: >90 mL/min (ref >90)
GFR calc non Af Amer: >90 mL/min (ref >90)
GLUCOSE: 239 mg/dL — AB (ref 70–99)
POTASSIUM: 3.7 mmol/L (ref 3.5–5.1)
Sodium: 138 mmol/L (ref 135–145)

## 2014-09-30 LAB — RAPID URINE DRUG SCREEN, HOSP PERFORMED
AMPHETAMINES: NOT DETECTED
BENZODIAZEPINES: NOT DETECTED
Barbiturates: NOT DETECTED
Cocaine: NOT DETECTED
OPIATES: NOT DETECTED
TETRAHYDROCANNABINOL: NOT DETECTED

## 2014-09-30 LAB — CBC
HEMATOCRIT: 37.5 % (ref 36.0–46.0)
HEMOGLOBIN: 12 g/dL (ref 12.0–15.0)
MCH: 24.9 pg — ABNORMAL LOW (ref 26.0–34.0)
MCHC: 32 g/dL (ref 30.0–36.0)
MCV: 78 fL (ref 78.0–100.0)
Platelets: 373 10*3/uL (ref 150–400)
RBC: 4.81 MIL/uL (ref 3.87–5.11)
RDW: 13.4 % (ref 11.5–15.5)
WBC: 5.8 10*3/uL (ref 4.0–10.5)

## 2014-09-30 LAB — I-STAT TROPONIN, ED: TROPONIN I, POC: 0 ng/mL (ref 0.00–0.08)

## 2014-09-30 LAB — MRSA PCR SCREENING: MRSA by PCR: NEGATIVE

## 2014-09-30 LAB — GLUCOSE, CAPILLARY: Glucose-Capillary: 172 mg/dL — ABNORMAL HIGH (ref 70–99)

## 2014-09-30 LAB — CBG MONITORING, ED: GLUCOSE-CAPILLARY: 370 mg/dL — AB (ref 70–99)

## 2014-09-30 MED ORDER — STROKE: EARLY STAGES OF RECOVERY BOOK
Freq: Once | Status: AC
  Administered 2014-10-01: 10:00:00
  Filled 2014-09-30: qty 1

## 2014-09-30 MED ORDER — ACETAMINOPHEN 650 MG RE SUPP: 650.0000 mg | RECTAL | Status: DC | PRN

## 2014-09-30 MED ORDER — ASPIRIN 81 MG PO CHEW
324.0000 mg | CHEWABLE_TABLET | Freq: Once | ORAL | Status: AC
  Administered 2014-09-30: 324 mg via ORAL
  Filled 2014-09-30: qty 4

## 2014-09-30 MED ORDER — NICARDIPINE HCL IN NACL 20-0.86 MG/200ML-% IV SOLN: 3.0000 mg/h | INTRAVENOUS | Status: DC

## 2014-09-30 MED ORDER — INSULIN DETEMIR 100 UNIT/ML FLEXPEN: 40.0000 [IU] | PEN_INJECTOR | Freq: Every day | SUBCUTANEOUS | Status: DC

## 2014-09-30 MED ORDER — SENNOSIDES-DOCUSATE SODIUM 8.6-50 MG PO TABS
1.0000 | ORAL_TABLET | Freq: Every evening | ORAL | Status: DC | PRN
  Filled 2014-09-30: qty 1

## 2014-09-30 MED ORDER — IRBESARTAN 150 MG PO TABS
150.0000 mg | ORAL_TABLET | Freq: Every day | ORAL | Status: DC
  Administered 2014-09-30 – 2014-10-01 (×2): 150 mg via ORAL
  Filled 2014-09-30 (×3): qty 1

## 2014-09-30 MED ORDER — INSULIN DETEMIR 100 UNIT/ML ~~LOC~~ SOLN
40.0000 [IU] | Freq: Every day | SUBCUTANEOUS | Status: DC
  Administered 2014-09-30: 40 [IU] via SUBCUTANEOUS
  Filled 2014-09-30 (×2): qty 0.4

## 2014-09-30 MED ORDER — ACETAMINOPHEN 325 MG PO TABS: 650.0000 mg | ORAL_TABLET | ORAL | Status: DC | PRN

## 2014-09-30 MED ORDER — ENOXAPARIN SODIUM 40 MG/0.4ML ~~LOC~~ SOLN
40.0000 mg | SUBCUTANEOUS | Status: DC
  Administered 2014-09-30 – 2014-10-01 (×2): 40 mg via SUBCUTANEOUS
  Filled 2014-09-30 (×2): qty 0.4

## 2014-09-30 MED ORDER — INSULIN ASPART 100 UNIT/ML ~~LOC~~ SOLN
10.0000 [IU] | Freq: Every evening | SUBCUTANEOUS | Status: DC
  Administered 2014-09-30: 10 [IU] via SUBCUTANEOUS

## 2014-09-30 MED ORDER — INSULIN DETEMIR 100 UNIT/ML FLEXPEN
40.0000 [IU] | PEN_INJECTOR | Freq: Every evening | SUBCUTANEOUS | Status: DC | PRN
Start: 2014-09-30 — End: 2014-09-30

## 2014-09-30 MED ORDER — ASPIRIN 300 MG RE SUPP: 300.0000 mg | Freq: Every day | RECTAL | Status: DC

## 2014-09-30 MED ORDER — LABETALOL HCL 5 MG/ML IV SOLN: 10.0000 mg | INTRAVENOUS | Status: DC | PRN

## 2014-09-30 MED ORDER — SODIUM CHLORIDE 0.9 % IV SOLN
INTRAVENOUS | Status: DC
  Administered 2014-09-30: 21:00:00 via INTRAVENOUS

## 2014-09-30 MED ORDER — INSULIN ASPART 100 UNIT/ML ~~LOC~~ SOLN
0.0000 [IU] | Freq: Three times a day (TID) | SUBCUTANEOUS | Status: DC
  Administered 2014-10-01: 3 [IU] via SUBCUTANEOUS

## 2014-09-30 MED ORDER — INSULIN DETEMIR 100 UNIT/ML FLEXPEN: 40.0000 [IU] | PEN_INJECTOR | SUBCUTANEOUS | Status: DC

## 2014-09-30 MED ORDER — ASPIRIN 325 MG PO TABS
325.0000 mg | ORAL_TABLET | Freq: Every day | ORAL | Status: DC
  Administered 2014-10-01 – 2014-10-02 (×2): 325 mg via ORAL
  Filled 2014-09-30 (×2): qty 1

## 2014-09-30 MED ORDER — HYDRALAZINE HCL 20 MG/ML IJ SOLN: 10.0000 mg | Freq: Four times a day (QID) | INTRAMUSCULAR | Status: DC | PRN

## 2014-09-30 MED ORDER — AMLODIPINE BESYLATE 5 MG PO TABS
5.0000 mg | ORAL_TABLET | Freq: Two times a day (BID) | ORAL | Status: DC
  Administered 2014-09-30 – 2014-10-01 (×2): 5 mg via ORAL
  Filled 2014-09-30 (×2): qty 1

## 2014-09-30 MED ORDER — HYDROCHLOROTHIAZIDE 25 MG PO TABS
25.0000 mg | ORAL_TABLET | Freq: Every day | ORAL | Status: DC
  Administered 2014-09-30 – 2014-10-02 (×3): 25 mg via ORAL
  Filled 2014-09-30 (×3): qty 1

## 2014-09-30 MED ORDER — VALSARTAN-HYDROCHLOROTHIAZIDE 160-25 MG PO TABS: 1.0000 | ORAL_TABLET | Freq: Every day | ORAL | Status: DC

## 2014-09-30 MED ORDER — NICARDIPINE HCL IN NACL 20-0.86 MG/200ML-% IV SOLN
5.0000 mg/h | Freq: Once | INTRAVENOUS | Status: AC
  Administered 2014-09-30: 5 mg/h via INTRAVENOUS
  Filled 2014-09-30: qty 200

## 2014-09-30 MED ORDER — INSULIN ASPART 100 UNIT/ML ~~LOC~~ SOLN
0.0000 [IU] | Freq: Every day | SUBCUTANEOUS | Status: DC
  Administered 2014-09-30: 5 [IU] via SUBCUTANEOUS
  Administered 2014-10-01: 3 [IU] via SUBCUTANEOUS

## 2014-09-30 NOTE — Consult Note (Signed)
Neurology Consultation Reason for Consult: Stroke Referring Physician: Ephriam Jenkins  CC: Stroke  History is obtained from: Patient  HPI: Shamiqua Vanderaa is a 59 y.o. female with a history of diabetes and hypertension who presents with left-sided weakness and numbness that was noticed on awakening in the wee hours of the morning. She noticed her left side was numb and was not acting right but went to work anyway. She noticed that she was dropping things at work and therefore presented to the emergency room where a CT shows a likely basal ganglia infarct. She has also been hypertensive and was started on a Cardene drip, though her blood pressure has been doing better.   LKW: 2/20 prior to bed tpa given?: no, outside of window    ROS: A 14 point ROS was performed and is negative except as noted in the HPI.   Past Medical History  Diagnosis Date  . Diabetes mellitus   . Hypertension     Family History: Diabetes  Social History: Tob: Denies  Exam: Current vital signs: BP 162/71 mmHg  Pulse 72  Temp(Src) 98 F (36.7 C) (Oral)  Resp 23  SpO2 97% Vital signs in last 24 hours: Temp:  [98 F (36.7 C)] 98 F (36.7 C) (02/21 1605) Pulse Rate:  [72-91] 72 (02/21 2100) Resp:  [12-23] 23 (02/21 2100) BP: (155-226)/(63-106) 162/71 mmHg (02/21 2100) SpO2:  [97 %-100 %] 97 % (02/21 2100)   Physical Exam  Constitutional: Appears well-developed and well-nourished.  Psych: Affect appropriate to situation Eyes: No scleral injection HENT: No OP obstrucion Head: Normocephalic.  Cardiovascular: Normal rate and regular rhythm.  Respiratory: Effort normal and breath sounds normal to anterior ascultation GI: Soft.  No distension. There is no tenderness.  Skin: WDI  Neuro: Mental Status: Patient is awake, alert, oriented to person, place, month, year, and situation. Patient is able to give a clear and coherent history. No signs of aphasia or neglect Cranial Nerves: II: Visual Fields  are full. Pupils are equal, round, and reactive to light.  III,IV, VI: EOMI without ptosis or diploplia.  V: Facial sensation is symmetric to temperature VII: Facial movement is symmetric.  VIII: hearing is intact to voice X: Uvula elevates symmetrically XI: Shoulder shrug is symmetric. XII: tongue is midline without atrophy or fasciculations.  Motor: Tone is normal. Bulk is normal. She has mild 4+/5 strength of the left arm and leg. Sensory: Sensation is decreased on left face arm and leg Deep Tendon Reflexes: 2+ and symmetric in the biceps and patellae.  Plantars: Toes are downgoing bilaterally.  Cerebellar: FNF and HKS are intact on the right, she does have some mild dysmetria of the left arm out of proportion to weakness         I have reviewed labs in epic and the results pertinent to this consultation are: Elevated glucose  I have reviewed the images obtained: CT head-likely right basal ganglia infarct  Impression: 59 year old female with likely right basal ganglion infarct secondary to small vessel risk factors including hypertension and diabetes. She was not taking any antiplatelet prior to admission.  Recommendations: 1. HgbA1c, fasting lipid panel 2. MRI, MRA  of the brain without contrast 3. Frequent neuro checks 4. Echocardiogram 5. Carotid dopplers 6. Prophylactic therapy-Antiplatelet med: Aspirin - dose 325mg  PO or 300mg  PR 7. Risk factor modification 8. Telemetry monitoring 9. PT consult, OT consult, Speech consult 10. Permissive hypertension to 220/120, labetalol PRN blood pressures higher than this.  Roland Rack, MD [facility] [phone]  If 7pm- 7am, please page neurology on call as listed in [place].

## 2014-09-30 NOTE — ED Notes (Signed)
Woke at 2 am with left sided facial, arm and leg numbness. Weakness in L leg. Reports throughout the day speech was 'off,' my mind would go blank. Speaking clearly on arrival. AAO x 4.

## 2014-09-30 NOTE — H&P (Addendum)
Patient's PCP: Elyn Peers, MD  Chief Complaint: Left sided numbness  History of Present Illness: Amber Young is a 59 y.o. African-American female with history of hypertension and diabetes, who presents with the above complaints.  Patient reports that her symptoms started yesterday about 8-9 p.m. after she will come from a nap at 7 p.m. after she came home from work.  She felt a little bit dizzy and lightheaded at that time.  She went back to bed and woke up somewhere between 1-3 a.m. with left-sided numbness.  She felt numb in her face, arm, and left leg when she attempted to go to the bathroom.  She felt like her arm was doing "funny things," as she noted that her arm was wiggling without her control.  When she walked, she could not place her left leg properly due to numbness.  She went back to sleep and woke up at 730 a.m. with still persistent left-sided numbness.  She went to work, where she noted she was dropping that she was dropping objects which were normal left hand multiple times.  As a result she presented to the emergency department for further evaluation.  She had a head CT which showed possible acute or subacute lacunar type infarct in the right basal ganglia area.  She was also found to have a blood pressure of 226/106 and was started on a low dose nicardipine drip. Dr. Mingo Amber, ED physician, who talked to Dr. Nicole Kindred, neurology, who recommended patient be transferred to [facility] from [facility] for further care and management.  Patient denies any recent fevers, chills, nausea, vomiting, chest pain, shortness of breath, abdominal pain, diarrhea, headaches or vision changes.  Review of Systems: All systems reviewed with the patient and positive as per history of present illness, otherwise all other systems are negative.  Past Medical History  Diagnosis Date  . Diabetes mellitus   . Hypertension    History reviewed. No pertinent past surgical history. Family History   Problem Relation Age of Onset  . Diabetes Mother    History   Social History  . Marital Status: Legally Separated    Spouse Name: N/A  . Number of Children: N/A  . Years of Education: N/A   Occupational History  . Not on file.   Social History Main Topics  . Smoking status: Never Smoker   . Smokeless tobacco: Never Used  . Alcohol Use: No  . Drug Use: Not on file  . Sexual Activity: Not on file   Other Topics Concern  . Not on file   Social History Narrative   Allergies: Review of patient's allergies indicates no known allergies.  Home Meds: Prior to Admission medications   Medication Sig Start Date End Date Taking? Authorizing Provider  amLODipine (NORVASC) 5 MG tablet Take 5 mg by mouth 2 (two) times daily.   Yes Historical Provider, MD  BIOTIN PO Take 1 tablet by mouth daily.   Yes Historical Provider, MD  insulin aspart (NOVOLOG) 100 UNIT/ML injection Inject 10 Units into the skin every evening.   Yes Historical Provider, MD  Insulin Detemir (LEVEMIR) 100 UNIT/ML Pen Inject 40 Units into the skin every 3 (three) days.    Yes Historical Provider, MD  metFORMIN (GLUCOPHAGE) 1000 MG tablet Take 1,000 mg by mouth 2 (two) times daily with a meal.   Yes Historical Provider, MD  valsartan-hydrochlorothiazide (DIOVAN-HCT) 160-25 MG per tablet Take 1 tablet by mouth daily.   Yes Historical Provider, MD  ondansetron (  ZOFRAN) 4 MG tablet Take 1 tablet (4 mg total) by mouth every 6 (six) hours. Patient not taking: Reported on 09/30/2014 04/12/13   Carman Ching, PA-C    Physical Exam: Blood pressure 155/70, pulse 86, temperature 98 F (36.7 C), temperature source Oral, resp. rate 16, SpO2 98 %. General: Awake, Oriented x3, No acute distress. HEENT: EOMI, Moist mucous membranes Neck: Supple CV: S1 and S2 Lungs: Clear to ascultation bilaterally Abdomen: Soft, Nontender, Nondistended, +bowel sounds. Ext: Good pulses. Trace edema. No clubbing or cyanosis noted. Neuro: Cranial  Nerves II-XII grossly intact, except numbness on left face. Has 5/5 motor strength in upper and lower extremities, except numbness on left side of the body including arm and leg.  No pronator drift noted.  Lab results:  Recent Labs  09/30/14 1613  NA 138  K 3.7  CL 100  CO2 31  GLUCOSE 239*  BUN 17  CREATININE 0.53  CALCIUM 9.5   No results for input(s): AST, ALT, ALKPHOS, BILITOT, PROT, ALBUMIN in the last 72 hours. No results for input(s): LIPASE, AMYLASE in the last 72 hours.  Recent Labs  09/30/14 1613  WBC 5.8  HGB 12.0  HCT 37.5  MCV 78.0  PLT 373   No results for input(s): CKTOTAL, CKMB, CKMBINDEX, TROPONINI in the last 72 hours. Invalid input(s): POCBNP No results for input(s): DDIMER in the last 72 hours. No results for input(s): HGBA1C in the last 72 hours. No results for input(s): CHOL, HDL, LDLCALC, TRIG, CHOLHDL, LDLDIRECT in the last 72 hours. No results for input(s): TSH, T4TOTAL, T3FREE, THYROIDAB in the last 72 hours.  Invalid input(s): FREET3 No results for input(s): VITAMINB12, FOLATE, FERRITIN, TIBC, IRON, RETICCTPCT in the last 72 hours. Imaging results:  Dg Chest 2 View  09/30/2014   CLINICAL DATA:  Diffuse of body numbness today.  EXAM: CHEST  2 VIEW  COMPARISON:  02/03/2013.  FINDINGS: Normal sized heart. Minimal linear scarring at both lateral costophrenic angles. Otherwise, clear lungs. Minimal bilateral shoulder degenerative changes and mild thoracic spine degenerative changes.  IMPRESSION: No acute abnormality.   Electronically Signed   By: Claudie Revering M.D.   On: 09/30/2014 16:30   Ct Head Wo Contrast  09/30/2014   CLINICAL DATA:  Woke up yesterday with left arm and leg weakness and left-sided facial numbness  EXAM: CT HEAD WITHOUT CONTRAST  TECHNIQUE: Contiguous axial images were obtained from the base of the skull through the vertex without intravenous contrast.  COMPARISON:  None.  FINDINGS: The ventricles are normal in size and configuration.  No extra-axial fluid collections are identified. The gray-white differentiation is normal. No CT findings for hemispheric infarction an or intracranial hemorrhage. There is a small focus of low-attenuation in the right basal ganglia region which could be an acute or subacute lacunar type infarct. No hemorrhage. No mass lesions. The brainstem and cerebellum are grossly normal.  The bony structures are intact. The paranasal sinuses and mastoid air cells are clear. The globes are intact.  IMPRESSION: Possible acute or subacute lacunar type infarct in the right basal ganglia area. MRI with diffusion-weighted imaging may be helpful for further evaluation.  No findings for hemispheric infarction or intracranial hemorrhage.   Electronically Signed   By: Marijo Sanes M.D.   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  Patient received aspirin in the emergency department, patient does not take  aspirin at home.  Continue aspirin.  Will get an MRI of the brain, carotid Dopplers, and 2-D echocardiogram as part of stroke workup.  Neurology will see the patient once she arrives at [facility].  Continue neuro checks.  Request PT, OT, and speech therapy evaluation.  Check hemoglobin A1c and lipid panel in the morning to risk stratify the patient.  Gently hydrate the patient on IV fluids to help with brain perfusion.  Allow for permissive hypertension with a systolic blood pressure of 160-180.  Uncontrolled hypertension Patient on nicardipine drip, admit to neuro ICU, discussed with Dr. Tamala Julian, critical care, ok for hospitalist to continue to manage patient's care while in neuro ICU.  PRN hydralazine for SBP greater than 165. Continue home antihypertensive medications.  Allow permissive hypertension for the next 24 hours, then patient will meet aggressive blood pressure control.  Uncontrolled diabetes Continue  home insulin regimen, except metformin has been held.  Sliding scale insulin.  Diabetic diet once patient passes swallow evaluation.  Prophylaxis Lovenox.  CODE STATUS Full code.  Disposition Admit the patient to stepdown as inpatient.  Time spent on admission, talking to the patient, and coordinating care was: 50 mins.  Shali Vesey A, MD 09/30/2014, 5:50 PM

## 2014-09-30 NOTE — ED Notes (Signed)
MD Reddy at bedside.

## 2014-09-30 NOTE — ED Notes (Signed)
Report called to [facility] by Kathlee Nations, RN carelink called

## 2014-09-30 NOTE — ED Notes (Signed)
Patient transported to CT 

## 2014-09-30 NOTE — ED Provider Notes (Signed)
CSN: [id number]     Arrival date & time 09/30/14  1554 History   First MD Initiated Contact with Patient 09/30/14 1557     Chief Complaint  Patient presents with  . Numbness     (Consider location/radiation/quality/duration/timing/severity/associated sxs/prior Treatment) Patient is a 59 y.o. female presenting with neurologic complaint. The history is provided by the patient.  Neurologic Problem This is a new problem. The current episode started 12 to 24 hours ago. The problem occurs constantly. The problem has not changed since onset.Pertinent negatives include no chest pain, no abdominal pain and no shortness of breath. Nothing aggravates the symptoms. Nothing relieves the symptoms. She has tried nothing for the symptoms.    Past Medical History  Diagnosis Date  . Diabetes mellitus   . Hypertension    History reviewed. No pertinent past surgical history. History reviewed. No pertinent family history. History  Substance Use Topics  . Smoking status: Never Smoker   . Smokeless tobacco: Never Used  . Alcohol Use: No   OB History    No data available     Review of Systems  Constitutional: Negative for fever and chills.  Eyes: Negative for visual disturbance.  Respiratory: Negative for cough and shortness of breath.   Cardiovascular: Negative for chest pain and leg swelling.  Gastrointestinal: Negative for vomiting and abdominal pain.  All other systems reviewed and are negative.     Allergies  Review of patient's allergies indicates no known allergies.  Home Medications   Prior to Admission medications   Medication Sig Start Date End Date Taking? Authorizing Provider  acetaminophen (TYLENOL) 500 MG tablet Take 500 mg by mouth every 6 (six) hours as needed for pain.    Historical Provider, MD  BIOTIN PO Take 1 tablet by mouth daily.    Historical Provider, MD  insulin NPH-regular (NOVOLIN 70/30) (70-30) 100 UNIT/ML injection Inject 25 Units into the skin daily with  breakfast. 25 units in the morning and 20 units in the evening    Historical Provider, MD  Liraglutide (VICTOZA) 18 MG/3ML SOPN Inject 1.8 mg into the skin daily.    Historical Provider, MD  lisinopril-hydrochlorothiazide (PRINZIDE,ZESTORETIC) 20-12.5 MG per tablet Take 1 tablet by mouth daily.    Historical Provider, MD  metFORMIN (GLUCOPHAGE) 1000 MG tablet Take 1,000 mg by mouth 2 (two) times daily with a meal.    Historical Provider, MD  ondansetron (ZOFRAN) 4 MG tablet Take 1 tablet (4 mg total) by mouth every 6 (six) hours. 04/12/13   Robyn M Hess, PA-C  traMADol (ULTRAM) 50 MG tablet Take 50 mg by mouth every 6 (six) hours as needed for pain.    Historical Provider, MD   BP 226/106 mmHg  Pulse 90  Temp(Src) 98 F (36.7 C) (Oral)  Resp 15  SpO2 98% Physical Exam  Constitutional: She is oriented to person, place, and time. She appears well-developed and well-nourished. No distress.  HENT:  Head: Normocephalic and atraumatic.  Mouth/Throat: Oropharynx is clear and moist.  Eyes: EOM are normal. Pupils are equal, round, and reactive to light.  Neck: Normal range of motion. Neck supple.  Cardiovascular: Normal rate and regular rhythm.  Exam reveals no friction rub.   No murmur heard. Pulmonary/Chest: Effort normal and breath sounds normal. No respiratory distress. She has no wheezes. She has no rales.  Abdominal: Soft. She exhibits no distension. There is no tenderness. There is no rebound.  Musculoskeletal: Normal range of motion. She exhibits no edema.  Neurological: She  is alert and oriented to person, place, and time. A cranial nerve deficit (L facial numbness) and sensory deficit (Altered light touch on left arm and leg) is present. She exhibits normal muscle tone. Coordination normal. GCS eye subscore is 4. GCS verbal subscore is 5. GCS motor subscore is 6.  Skin: Skin is warm. No rash noted. She is not diaphoretic.  Nursing note and vitals reviewed.   ED Course  Procedures  (including critical care time) Labs Review Labs Reviewed  CBC  BASIC METABOLIC PANEL  URINE RAPID DRUG SCREEN (HOSP PERFORMED)  Randolm Idol, ED    Imaging Review Dg Chest 2 View  09/30/2014   CLINICAL DATA:  Diffuse of body numbness today.  EXAM: CHEST  2 VIEW  COMPARISON:  02/03/2013.  FINDINGS: Normal sized heart. Minimal linear scarring at both lateral costophrenic angles. Otherwise, clear lungs. Minimal bilateral shoulder degenerative changes and mild thoracic spine degenerative changes.  IMPRESSION: No acute abnormality.   Electronically Signed   By: Claudie Revering M.D.   On: 09/30/2014 16:30   Ct Head Wo Contrast  09/30/2014   CLINICAL DATA:  Woke up yesterday with left arm and leg weakness and left-sided facial numbness  EXAM: CT HEAD WITHOUT CONTRAST  TECHNIQUE: Contiguous axial images were obtained from the base of the skull through the vertex without intravenous contrast.  COMPARISON:  None.  FINDINGS: The ventricles are normal in size and configuration. No extra-axial fluid collections are identified. The gray-white differentiation is normal. No CT findings for hemispheric infarction an or intracranial hemorrhage. There is a small focus of low-attenuation in the right basal ganglia region which could be an acute or subacute lacunar type infarct. No hemorrhage. No mass lesions. The brainstem and cerebellum are grossly normal.  The bony structures are intact. The paranasal sinuses and mastoid air cells are clear. The globes are intact.  IMPRESSION: Possible acute or subacute lacunar type infarct in the right basal ganglia area. MRI with diffusion-weighted imaging may be helpful for further evaluation.  No findings for hemispheric infarction or intracranial hemorrhage.   Electronically Signed   By: Marijo Sanes M.D.   On: 09/30/2014 16:37     EKG Interpretation None     CRITICAL CARE Performed by: Osvaldo Shipper   Total critical care time: 30 minutes  Critical care time  was exclusive of separately billable procedures and treating other patients.  Critical care was necessary to treat or prevent imminent or life-threatening deterioration.  Critical care was time spent personally by me on the following activities: development of treatment plan with patient and/or surrogate as well as nursing, discussions with consultants, evaluation of patient's response to treatment, examination of patient, obtaining history from patient or surrogate, ordering and performing treatments and interventions, ordering and review of laboratory studies, ordering and review of radiographic studies, pulse oximetry and re-evaluation of patient's condition.  MDM   Final diagnoses:  Left sided numbness  Stroke  Hypertensive emergency    89F here with left sided numbness. Began last night around 1 am. Constant since it began, no alleviating/exacerbating factors. No hx of stroke. Hx of DM and HTN. Afebrile, markedly hypertensive here. BP 226/106. Will start nicardipene, CT Head.  On exam, does have altered light touch on her L side. Concern for stroke. Head CT shows acute to subacute right basal ganglia stroke. He is improving with the [facility] pain. Admitted, transferred to [facility]. I spoke with neurology who will see patient.    Evelina Bucy, MD 09/30/14  2304 

## 2014-09-30 NOTE — ED Notes (Signed)
CBG 370 prior to leaving for [facility] Dr. Mingo Amber (EDP) made aware--No new orders Per EDP, patient to be transferred to [facility] as planned [facility] present in ED and made aware Sonia Side with [facility] made aware that Dr. Mingo Amber was informed of CBG, no new orders given and that patient is to be transferred to [facility]

## 2014-10-01 ENCOUNTER — Inpatient Hospital Stay (HOSPITAL_COMMUNITY): Payer: 59

## 2014-10-01 DIAGNOSIS — I6789 Other cerebrovascular disease: Secondary | ICD-10-CM

## 2014-10-01 DIAGNOSIS — I10 Essential (primary) hypertension: Secondary | ICD-10-CM

## 2014-10-01 DIAGNOSIS — I161 Hypertensive emergency: Secondary | ICD-10-CM | POA: Insufficient documentation

## 2014-10-01 LAB — GLUCOSE, CAPILLARY
GLUCOSE-CAPILLARY: 107 mg/dL — AB (ref 70–99)
Glucose-Capillary: 75 mg/dL (ref 70–99)
Glucose-Capillary: 208 mg/dL — ABNORMAL HIGH (ref 70–99)
Glucose-Capillary: 283 mg/dL — ABNORMAL HIGH (ref 70–99)

## 2014-10-01 MED ORDER — AMLODIPINE BESYLATE 5 MG PO TABS
5.0000 mg | ORAL_TABLET | Freq: Every day | ORAL | Status: DC
  Filled 2014-10-01: qty 1

## 2014-10-01 MED ORDER — SODIUM CHLORIDE 0.9 % IV SOLN
INTRAVENOUS | Status: DC
  Administered 2014-10-01 – 2014-10-02 (×2): via INTRAVENOUS

## 2014-10-01 MED ORDER — INSULIN DETEMIR 100 UNIT/ML ~~LOC~~ SOLN
25.0000 [IU] | Freq: Every day | SUBCUTANEOUS | Status: DC
  Administered 2014-10-01: 25 [IU] via SUBCUTANEOUS
  Filled 2014-10-01 (×2): qty 0.25

## 2014-10-01 NOTE — Progress Notes (Signed)
  Echocardiogram 2D Echocardiogram has been performed.  Amber Young 10/01/2014, 4:40 PM

## 2014-10-01 NOTE — Progress Notes (Signed)
[facility] PROGRESS NOTE  Amber Young [id number] DOB: 1956-01-24 DOA: 09/30/2014 PCP: Elyn Peers, MD  Assessment/Plan: Acute lacunar infarct in the right basal ganglia/left sided numbness Patient received aspirin in the emergency department, patient does not take aspirin at home. Continue aspirin. carotid Dopplers: no significant stenosis.  and 2-D echocardiogram pending.  Neurology following.  Request PT, OT, and speech therapy evaluation. hemoglobin A1c and lipid panel pending.  Allow for permissive hypertension with a systolic blood pressure of 160-180.  Uncontrolled hypertension Off nicardipine drip, Continue home antihypertensive medications.  Allow permissive hypertension for the next 24 hours.   Uncontrolled diabetes Change levemir to 25 units. SSI.  Need continue education.   Code Status: full code.  Family Communication: care discussed with daughter  Disposition Plan: awaiting stroke work up a,d PT evaluation.    Consultants:  neurology  Procedures:  ECHO; pending.   Doppler; Preliminary report: 1-39% ICA stenosis. Vertebral artery flow is antegrade.   Antibiotics:  none  HPI/Subjective: Feeling better, back to baseline   Objective: Filed Vitals:   10/01/14 1431  BP: 128/54  Pulse: 78  Temp: 98.4 F (36.9 C)  Resp: 20    Intake/Output Summary (Last 24 hours) at 10/01/14 1444 Last data filed at 10/01/14 0000  Gross per 24 hour  Intake 400.42 ml  Output      0 ml  Net 400.42 ml   Filed Weights   09/30/14 2045  Weight: 84.1 kg (185 lb 6.5 oz)    Exam:   General: Alert in no distress.   Cardiovascular: S 1, S 2 RRR  Respiratory: CTA  Abdomen: BS present, soft,nt  Musculoskeletal: no edema.   Data Reviewed: Basic Metabolic Panel:  Recent Labs Lab 09/30/14 1613  NA 138  K 3.7  CL 100  CO2 31  GLUCOSE 239*  BUN 17  CREATININE 0.53  CALCIUM 9.5   Liver Function Tests: No results for input(s): AST,  ALT, ALKPHOS, BILITOT, PROT, ALBUMIN in the last 168 hours. No results for input(s): LIPASE, AMYLASE in the last 168 hours. No results for input(s): AMMONIA in the last 168 hours. CBC:  Recent Labs Lab 09/30/14 1613  WBC 5.8  HGB 12.0  HCT 37.5  MCV 78.0  PLT 373   Cardiac Enzymes: No results for input(s): CKTOTAL, CKMB, CKMBINDEX, TROPONINI in the last 168 hours. BNP (last 3 results) No results for input(s): BNP in the last 8760 hours.  ProBNP (last 3 results) No results for input(s): PROBNP in the last 8760 hours.  CBG:  Recent Labs Lab 09/30/14 1944 09/30/14 2313 10/01/14 0650 10/01/14 1134  GLUCAP 370* 172* 75 107*    Recent Results (from the past 240 hour(s))  MRSA PCR Screening     Status: None   Collection Time: 09/30/14  8:59 PM  Result Value Ref Range Status   MRSA by PCR NEGATIVE NEGATIVE Final    Comment:        The GeneXpert MRSA Assay (FDA approved for NASAL specimens only), is one component of a comprehensive MRSA colonization surveillance program. It is not intended to diagnose MRSA infection nor to guide or monitor treatment for MRSA infections.      Studies: Dg Chest 2 View  09/30/2014   CLINICAL DATA:  Diffuse of body numbness today.  EXAM: CHEST  2 VIEW  COMPARISON:  02/03/2013.  FINDINGS: Normal sized heart. Minimal linear scarring at both lateral costophrenic angles. Otherwise, clear lungs. Minimal bilateral shoulder degenerative changes and mild thoracic spine degenerative  changes.  IMPRESSION: No acute abnormality.   Electronically Signed   By: Claudie Revering M.D.   On: 09/30/2014 16:30   Ct Head Wo Contrast  09/30/2014   CLINICAL DATA:  Woke up yesterday with left arm and leg weakness and left-sided facial numbness  EXAM: CT HEAD WITHOUT CONTRAST  TECHNIQUE: Contiguous axial images were obtained from the base of the skull through the vertex without intravenous contrast.  COMPARISON:  None.  FINDINGS: The ventricles are normal in size and  configuration. No extra-axial fluid collections are identified. The gray-white differentiation is normal. No CT findings for hemispheric infarction an or intracranial hemorrhage. There is a small focus of low-attenuation in the right basal ganglia region which could be an acute or subacute lacunar type infarct. No hemorrhage. No mass lesions. The brainstem and cerebellum are grossly normal.  The bony structures are intact. The paranasal sinuses and mastoid air cells are clear. The globes are intact.  IMPRESSION: Possible acute or subacute lacunar type infarct in the right basal ganglia area. MRI with diffusion-weighted imaging may be helpful for further evaluation.  No findings for hemispheric infarction or intracranial hemorrhage.   Electronically Signed   By: Marijo Sanes M.D.   On: 09/30/2014 16:37   Mr Brain Wo Contrast  10/01/2014   EXAM: MRI HEAD WITHOUT CONTRAST  MRA HEAD WITHOUT CONTRAST  TECHNIQUE: Multiplanar, multiecho pulse sequences of the brain and surrounding structures were obtained without intravenous contrast. Angiographic images of the head were obtained using MRA technique without contrast.  COMPARISON:  Prior CT from 09/30/2013  FINDINGS: MRI HEAD FINDINGS  Cerebral volume within normal limits for patient age. Mild scattered and confluent T2/FLAIR hyperintensity within the periventricular and deep white matter both cerebral hemispheres noted, most compatible with mild chronic small vessel ischemic disease. No remote infarct identified.  There is an 11 mm focus of restricted diffusion involving the lateral right thalamus, compatible with an acute ischemic infarcts. No associated hemorrhage or significant mass effect. No other intracranial hemorrhage.  No mass lesion or midline shift. No hydrocephalus. No extra-axial fluid collection.  Craniocervical junction within normal limits. Pituitary gland is normal.  No acute abnormality seen about the orbits.  Paranasal sinuses are clear.  No mastoid  effusion.  Bone marrow signal intensity is normal. Scalp soft tissues within normal limits.  MRA HEAD FINDINGS  ANTERIOR CIRCULATION:  Visualized distal cervical segments of the internal carotid arteries are widely patent with antegrade flow. The petrous, cavernous, and supra clinoid segments widely patent. Left A1 segment is dominant and widely patent. There is focal mild to moderate stenosis at the origin of the right A1 segment from the right ICA terminus. A2 segments well opacified bilaterally.  M1 segments widely patent without proximal branch occlusion or significant stenosis. MCA bifurcations normal. Mild distal branch ear irregularity present within the MCA branches bilaterally.  POSTERIOR CIRCULATION  Vertebral arteries are codominant and widely patent to the vertebrobasilar junction. Cysts posterior inferior cerebellar arteries are patent bilaterally. The basilar artery widely patent. Right anterior inferior cerebellar artery is dominant. Superior cerebellar arteries patent. P1 segments widely patent. There is a short-segment moderate to severe stenosis within the proximal left P2 segment. Additional short-segment moderate to severe stenosis present slightly more distally within the right P2 segment. Distal branch irregularity present within the posterior cerebral arteries bilaterally.  No aneurysm or vascular malformation.  IMPRESSION: MRI HEAD IMPRESSION:  1. 11 mm acute ischemic infarct involving the lateral right thalamus without associated hemorrhage or mass  effect. 2. Mild chronic small vessel ischemic disease involving the supratentorial white matter.  MRA HEAD IMPRESSION:  1. No proximal branch occlusion identified within the intracranial circulation. 2. Short-segment moderate to severe stenoses involving the bilateral P2 segments as above. 3. No other hemodynamically significant stenosis within the intracranial circulation. 4. Distal branch atherosclerotic irregularity within the MCA and PCA  branches bilaterally.   Electronically Signed   By: Jeannine Boga M.D.   On: 10/01/2014 03:31   Mr Jodene Nam Head/brain Wo Cm  10/01/2014   EXAM: MRI HEAD WITHOUT CONTRAST  MRA HEAD WITHOUT CONTRAST  TECHNIQUE: Multiplanar, multiecho pulse sequences of the brain and surrounding structures were obtained without intravenous contrast. Angiographic images of the head were obtained using MRA technique without contrast.  COMPARISON:  Prior CT from 09/30/2013  FINDINGS: MRI HEAD FINDINGS  Cerebral volume within normal limits for patient age. Mild scattered and confluent T2/FLAIR hyperintensity within the periventricular and deep white matter both cerebral hemispheres noted, most compatible with mild chronic small vessel ischemic disease. No remote infarct identified.  There is an 11 mm focus of restricted diffusion involving the lateral right thalamus, compatible with an acute ischemic infarcts. No associated hemorrhage or significant mass effect. No other intracranial hemorrhage.  No mass lesion or midline shift. No hydrocephalus. No extra-axial fluid collection.  Craniocervical junction within normal limits. Pituitary gland is normal.  No acute abnormality seen about the orbits.  Paranasal sinuses are clear.  No mastoid effusion.  Bone marrow signal intensity is normal. Scalp soft tissues within normal limits.  MRA HEAD FINDINGS  ANTERIOR CIRCULATION:  Visualized distal cervical segments of the internal carotid arteries are widely patent with antegrade flow. The petrous, cavernous, and supra clinoid segments widely patent. Left A1 segment is dominant and widely patent. There is focal mild to moderate stenosis at the origin of the right A1 segment from the right ICA terminus. A2 segments well opacified bilaterally.  M1 segments widely patent without proximal branch occlusion or significant stenosis. MCA bifurcations normal. Mild distal branch ear irregularity present within the MCA branches bilaterally.  POSTERIOR  CIRCULATION  Vertebral arteries are codominant and widely patent to the vertebrobasilar junction. Cysts posterior inferior cerebellar arteries are patent bilaterally. The basilar artery widely patent. Right anterior inferior cerebellar artery is dominant. Superior cerebellar arteries patent. P1 segments widely patent. There is a short-segment moderate to severe stenosis within the proximal left P2 segment. Additional short-segment moderate to severe stenosis present slightly more distally within the right P2 segment. Distal branch irregularity present within the posterior cerebral arteries bilaterally.  No aneurysm or vascular malformation.  IMPRESSION: MRI HEAD IMPRESSION:  1. 11 mm acute ischemic infarct involving the lateral right thalamus without associated hemorrhage or mass effect. 2. Mild chronic small vessel ischemic disease involving the supratentorial white matter.  MRA HEAD IMPRESSION:  1. No proximal branch occlusion identified within the intracranial circulation. 2. Short-segment moderate to severe stenoses involving the bilateral P2 segments as above. 3. No other hemodynamically significant stenosis within the intracranial circulation. 4. Distal branch atherosclerotic irregularity within the MCA and PCA branches bilaterally.   Electronically Signed   By: Jeannine Boga M.D.   On: 10/01/2014 03:31    Scheduled Meds: . amLODipine  5 mg Oral BID  . aspirin  300 mg Rectal Daily   Or  . aspirin  325 mg Oral Daily  . enoxaparin (LOVENOX) injection  40 mg Subcutaneous Q24H  . irbesartan  150 mg Oral Daily   And  .  hydrochlorothiazide  25 mg Oral Daily  . insulin aspart  0-5 Units Subcutaneous QHS  . insulin aspart  0-9 Units Subcutaneous TID WC  . insulin detemir  25 Units Subcutaneous QHS   Continuous Infusions: . sodium chloride 75 mL/hr at 10/01/14 0119    Principal Problem:   Acute CVA (cerebrovascular accident) Active Problems:   Stroke   Uncontrolled hypertension    Uncontrolled diabetes mellitus   CVA (cerebral infarction)    Time spent: 35 minutes.     Niel Hummer A  [facility] Pager [phone]. If 7PM-7AM, please contact night-coverage at [email or web address], password [facility] 10/01/2014, 2:44 PM  LOS: 1 day

## 2014-10-01 NOTE — Progress Notes (Signed)
STROKE TEAM PROGRESS NOTE   HISTORY Amber Young is a 59 y.o. female with a history of diabetes and hypertension who presents with left-sided weakness and numbness that was noticed on awakening in the wee hours of the morning. She noticed her left side was numb and was not acting right but went to work anyway. She noticed that she was dropping things at work and therefore presented to the emergency room where a CT shows a likely basal ganglia infarct. She has also been hypertensive and was started on a Cardene drip, though her blood pressure has been doing better.   LKW: 2/20 prior to bed tpa given?: no, outside of window  She was admitted to the neuro 4 N for further evaluation and treatment.   SUBJECTIVE (INTERVAL HISTORY) Her family is at the bedside.  Overall she feels her condition slightly improved.  OBJECTIVE Temp:  [98 F (36.7 C)-98.4 F (36.9 C)] 98.1 F (36.7 C) (02/22 0800) Pulse Rate:  [72-91] 82 (02/22 0800) Cardiac Rhythm:  [-] Normal sinus rhythm (02/22 0600) Resp:  [12-27] 18 (02/22 0800) BP: (145-226)/(59-106) 165/76 mmHg (02/22 0800) SpO2:  [97 %-100 %] 99 % (02/22 0800) Weight:  [84.1 kg (185 lb 6.5 oz)] 84.1 kg (185 lb 6.5 oz) (02/21 2045)   Recent Labs Lab 09/30/14 1944 09/30/14 2313 10/01/14 0650 10/01/14 1134  GLUCAP 370* 172* 75 107*    Recent Labs Lab 09/30/14 1613  NA 138  K 3.7  CL 100  CO2 31  GLUCOSE 239*  BUN 17  CREATININE 0.53  CALCIUM 9.5   No results for input(s): AST, ALT, ALKPHOS, BILITOT, PROT, ALBUMIN in the last 168 hours.  Recent Labs Lab 09/30/14 1613  WBC 5.8  HGB 12.0  HCT 37.5  MCV 78.0  PLT 373   No results for input(s): CKTOTAL, CKMB, CKMBINDEX, TROPONINI in the last 168 hours. No results for input(s): LABPROT, INR in the last 72 hours. No results for input(s): COLORURINE, LABSPEC, [place], GLUCOSEU, HGBUR, BILIRUBINUR, KETONESUR, PROTEINUR, UROBILINOGEN, NITRITE, LEUKOCYTESUR in the last 72  hours.  Invalid input(s): APPERANCEUR  No results found for: CHOL, TRIG, HDL, CHOLHDL, VLDL, LDLCALC No results found for: HGBA1C    Component Value Date/Time   LABOPIA NONE DETECTED 09/30/2014 1742   COCAINSCRNUR NONE DETECTED 09/30/2014 1742   LABBENZ NONE DETECTED 09/30/2014 1742   AMPHETMU NONE DETECTED 09/30/2014 1742   THCU NONE DETECTED 09/30/2014 1742   LABBARB NONE DETECTED 09/30/2014 1742    No results for input(s): ETH in the last 168 hours.  Dg Chest 2 View  09/30/2014   CLINICAL DATA:  Diffuse of body numbness today.  EXAM: CHEST  2 VIEW  COMPARISON:  02/03/2013.  FINDINGS: Normal sized heart. Minimal linear scarring at both lateral costophrenic angles. Otherwise, clear lungs. Minimal bilateral shoulder degenerative changes and mild thoracic spine degenerative changes.  IMPRESSION: No acute abnormality.   Electronically Signed   By: Claudie Revering M.D.   On: 09/30/2014 16:30   Ct Head Wo Contrast  09/30/2014   CLINICAL DATA:  Woke up yesterday with left arm and leg weakness and left-sided facial numbness  EXAM: CT HEAD WITHOUT CONTRAST  TECHNIQUE: Contiguous axial images were obtained from the base of the skull through the vertex without intravenous contrast.  COMPARISON:  None.  FINDINGS: The ventricles are normal in size and configuration. No extra-axial fluid collections are identified. The gray-white differentiation is normal. No CT findings for hemispheric infarction an or intracranial hemorrhage. There is a small  focus of low-attenuation in the right basal ganglia region which could be an acute or subacute lacunar type infarct. No hemorrhage. No mass lesions. The brainstem and cerebellum are grossly normal.  The bony structures are intact. The paranasal sinuses and mastoid air cells are clear. The globes are intact.  IMPRESSION: Possible acute or subacute lacunar type infarct in the right basal ganglia area. MRI with diffusion-weighted imaging may be helpful for further  evaluation.  No findings for hemispheric infarction or intracranial hemorrhage.   Electronically Signed   By: Marijo Sanes M.D.   On: 09/30/2014 16:37   Mr Brain Wo Contrast  10/01/2014   EXAM: MRI HEAD WITHOUT CONTRAST  MRA HEAD WITHOUT CONTRAST  TECHNIQUE: Multiplanar, multiecho pulse sequences of the brain and surrounding structures were obtained without intravenous contrast. Angiographic images of the head were obtained using MRA technique without contrast.  COMPARISON:  Prior CT from 09/30/2013  FINDINGS: MRI HEAD FINDINGS  Cerebral volume within normal limits for patient age. Mild scattered and confluent T2/FLAIR hyperintensity within the periventricular and deep white matter both cerebral hemispheres noted, most compatible with mild chronic small vessel ischemic disease. No remote infarct identified.  There is an 11 mm focus of restricted diffusion involving the lateral right thalamus, compatible with an acute ischemic infarcts. No associated hemorrhage or significant mass effect. No other intracranial hemorrhage.  No mass lesion or midline shift. No hydrocephalus. No extra-axial fluid collection.  Craniocervical junction within normal limits. Pituitary gland is normal.  No acute abnormality seen about the orbits.  Paranasal sinuses are clear.  No mastoid effusion.  Bone marrow signal intensity is normal. Scalp soft tissues within normal limits.  MRA HEAD FINDINGS  ANTERIOR CIRCULATION:  Visualized distal cervical segments of the internal carotid arteries are widely patent with antegrade flow. The petrous, cavernous, and supra clinoid segments widely patent. Left A1 segment is dominant and widely patent. There is focal mild to moderate stenosis at the origin of the right A1 segment from the right ICA terminus. A2 segments well opacified bilaterally.  M1 segments widely patent without proximal branch occlusion or significant stenosis. MCA bifurcations normal. Mild distal branch ear irregularity present  within the MCA branches bilaterally.  POSTERIOR CIRCULATION  Vertebral arteries are codominant and widely patent to the vertebrobasilar junction. Cysts posterior inferior cerebellar arteries are patent bilaterally. The basilar artery widely patent. Right anterior inferior cerebellar artery is dominant. Superior cerebellar arteries patent. P1 segments widely patent. There is a short-segment moderate to severe stenosis within the proximal left P2 segment. Additional short-segment moderate to severe stenosis present slightly more distally within the right P2 segment. Distal branch irregularity present within the posterior cerebral arteries bilaterally.  No aneurysm or vascular malformation.  IMPRESSION: MRI HEAD IMPRESSION:  1. 11 mm acute ischemic infarct involving the lateral right thalamus without associated hemorrhage or mass effect. 2. Mild chronic small vessel ischemic disease involving the supratentorial white matter.  MRA HEAD IMPRESSION:  1. No proximal branch occlusion identified within the intracranial circulation. 2. Short-segment moderate to severe stenoses involving the bilateral P2 segments as above. 3. No other hemodynamically significant stenosis within the intracranial circulation. 4. Distal branch atherosclerotic irregularity within the MCA and PCA branches bilaterally.   Electronically Signed   By: Jeannine Boga M.D.   On: 10/01/2014 03:31   Mr Jodene Nam Head/brain Wo Cm  10/01/2014   EXAM: MRI HEAD WITHOUT CONTRAST  MRA HEAD WITHOUT CONTRAST  TECHNIQUE: Multiplanar, multiecho pulse sequences of the brain and surrounding structures  were obtained without intravenous contrast. Angiographic images of the head were obtained using MRA technique without contrast.  COMPARISON:  Prior CT from 09/30/2013  FINDINGS: MRI HEAD FINDINGS  Cerebral volume within normal limits for patient age. Mild scattered and confluent T2/FLAIR hyperintensity within the periventricular and deep white matter both cerebral  hemispheres noted, most compatible with mild chronic small vessel ischemic disease. No remote infarct identified.  There is an 11 mm focus of restricted diffusion involving the lateral right thalamus, compatible with an acute ischemic infarcts. No associated hemorrhage or significant mass effect. No other intracranial hemorrhage.  No mass lesion or midline shift. No hydrocephalus. No extra-axial fluid collection.  Craniocervical junction within normal limits. Pituitary gland is normal.  No acute abnormality seen about the orbits.  Paranasal sinuses are clear.  No mastoid effusion.  Bone marrow signal intensity is normal. Scalp soft tissues within normal limits.  MRA HEAD FINDINGS  ANTERIOR CIRCULATION:  Visualized distal cervical segments of the internal carotid arteries are widely patent with antegrade flow. The petrous, cavernous, and supra clinoid segments widely patent. Left A1 segment is dominant and widely patent. There is focal mild to moderate stenosis at the origin of the right A1 segment from the right ICA terminus. A2 segments well opacified bilaterally.  M1 segments widely patent without proximal branch occlusion or significant stenosis. MCA bifurcations normal. Mild distal branch ear irregularity present within the MCA branches bilaterally.  POSTERIOR CIRCULATION  Vertebral arteries are codominant and widely patent to the vertebrobasilar junction. Cysts posterior inferior cerebellar arteries are patent bilaterally. The basilar artery widely patent. Right anterior inferior cerebellar artery is dominant. Superior cerebellar arteries patent. P1 segments widely patent. There is a short-segment moderate to severe stenosis within the proximal left P2 segment. Additional short-segment moderate to severe stenosis present slightly more distally within the right P2 segment. Distal branch irregularity present within the posterior cerebral arteries bilaterally.  No aneurysm or vascular malformation.  IMPRESSION:  MRI HEAD IMPRESSION:  1. 11 mm acute ischemic infarct involving the lateral right thalamus without associated hemorrhage or mass effect. 2. Mild chronic small vessel ischemic disease involving the supratentorial white matter.  MRA HEAD IMPRESSION:  1. No proximal branch occlusion identified within the intracranial circulation. 2. Short-segment moderate to severe stenoses involving the bilateral P2 segments as above. 3. No other hemodynamically significant stenosis within the intracranial circulation. 4. Distal branch atherosclerotic irregularity within the MCA and PCA branches bilaterally.   Electronically Signed   By: Jeannine Boga M.D.   On: 10/01/2014 03:31   PHYSICAL EXAM Constitutional: Appears well-developed and well-nourished.  Psych: Affect appropriate to situation Eyes: No scleral injection HENT: No OP obstrucion Head: Normocephalic.  Cardiovascular: Normal rate and regular rhythm.  Respiratory: Effort normal and breath sounds normal to anterior ascultation GI: Soft. No distension. There is no tenderness.  Skin: WDI  Neuro: Mental Status: Patient is awake, alert, oriented to person, place, month, year, and situation. Patient is able to give a clear and coherent history. No signs of aphasia or neglect Cranial Nerves: II: Visual Fields are full. Pupils are equal, round, and reactive to light. III,IV, VI: EOMI without ptosis or diploplia.  V: Facial sensation is symmetric to temperature VII: Facial movement is symmetric.  VIII: hearing is intact to voice X: Uvula elevates symmetrically XI: Shoulder shrug is symmetric. XII: tongue is midline without atrophy or fasciculations.  Motor: Tone is normal. Bulk is normal. She has mild 4+/5 strength of the left arm and leg.diminished fine  finger movements on left. Orbits right over left upper extremity Sensory: Sensation intact b/l  Deep Tendon Reflexes: 2+ and symmetric in the biceps and patellae.  Plantars: Toes are  downgoing bilaterally.  Cerebellar: FNF and HKS are intact on the right, she does have some mild dysmetria of the left arm out of proportion to weakness  ASSESSMENT/PLAN Ms. Tresea Zasada is a 59 y.o. female with history of diabetes and hypertension  presenting with left-sided weakness and numbness . She did not receive IV t-PA due to outside of window.   Stroke 11 mm acute ischemic infarct involving the lateral right thalamus secondary to small vessel disease   Resultant mild left hemiparesis.  MRI  11 mm acute ischemic infarct involving the lateral right thalamus without associated hemorrhage or mass effect. 2. Mild chronic small vessel ischemic disease involving the supratentorial white matter.  MRA  No proximal branch occlusion identified within the intracranial circulation. 2. Short-segment moderate to severe stenoses involving the bilateral P2 segments as above. 3. No other hemodynamically significant stenosis within the intracranial circulation. 4. Distal branch atherosclerotic irregularity within the MCA and PCA branches bilaterally.   Carotid Doppler  1-39% ICA stenosis. Vertebral artery flow is antegrade.   2D Echo  pending  LDL pending  HgbA1c pending  Lovenox for VTE prophylaxis  Diet Carb Modified thin liquids  no antithrombotic prior to admission, now on aspirin 325 mg orally every day   Patient counseled to be compliant with her antithrombotic medications  Ongoing aggressive stroke risk factor management  Therapy recommendations:  pending  Disposition: pending  Hypertension  Home meds:   Diovan/HCT/Norvasc  Unstable  Patient counseled to be compliant with her blood pressure medications  Hyperlipidemia  Home meds:  Nil of note  LDL Pending, goal < 70  Diabetes  HgbA1c Pending, goal < 7.0  Other Stroke Risk Factors  Obesity, Body mass index is 29.03 kg/(m^2).   Hospital day # 1 F/u with Dr. Leonie Man in stroke clinic in 2 months No other  recommendations. IF the above test are negative she can be d/c home.  Patients chart and images have been reviewed. Patient seen and discussed with Dr. Kyung Bacca, PA-C Department of Neurology.  I have personally examined this patient, reviewed notes, independently viewed imaging studies, participated in medical decision making and plan of care. I have made any additions or clarifications directly to the above note. Agree with note above. Patient has a small subcortical infarct but remains at risk for recurrent strokes and TIAs and needs ongoing stroke evaluation and aggressive risk factor modification.  Antony Contras, MD Medical Director [facility] Pager: [phone] 10/01/2014 1:57 PM    To contact Stroke Continuity provider, please refer to [email or web address] After hours, contact General Neurology

## 2014-10-01 NOTE — Progress Notes (Signed)
VASCULAR LAB PRELIMINARY  PRELIMINARY  PRELIMINARY  PRELIMINARY  Carotid Dopplers completed.    Preliminary report:  1-39% ICA stenosis.  Vertebral artery flow is antegrade.   Kaiven Vester, RVT 10/01/2014, 12:02 PM

## 2014-10-01 NOTE — Evaluation (Signed)
Physical Therapy Evaluation/Discharge Patient Details Name: Amber Young MRN: [id number] DOB: [birth year] Today's Date: 10/01/2014   History of Present Illness  59 y.o. female admitted to [facility] on 09/30/14 for left sided numbness and weakness.  Pt's CT revealed, "acute or subacute lacunar type infarct in the right basal ganglia area".  MRI revealed, "acute ischemic infarct involving the lateral right thalamus without associated hemorrhage or mass effect."  Further stroke workup pending.  Pt with significant PMHx of HTN and DM.    Clinical Impression  Pt is independent with all mobility, has 5/5 strength, normal sensation.  She does have a slightly abnormal gait pattern, but nothing that causes her to lose her balance when walking.  She has no current or f/u PT needs at this time.  PT to sign off.     Follow Up Recommendations No PT follow up    Equipment Recommendations  None recommended by PT    Recommendations for Other Services   NA    Precautions / Restrictions Precautions Precautions: None      Mobility  Bed Mobility Overal bed mobility: Independent                Transfers Overall transfer level: Independent                  Ambulation/Gait Ambulation/Gait assistance: Modified independent (Device/Increase time) Ambulation Distance (Feet): 210 Feet Assistive device: None     Gait velocity interpretation: Below normal speed for age/gender General Gait Details: Pt with slightly abnormal gait pattern, slower than normal, but steady.  No LOB.    Stairs Stairs: Yes Stairs assistance: Independent Stair Management: No rails;Forwards;Alternating pattern Number of Stairs: 5 General stair comments: No signs of buckling, imbalance or instability ascending or descending the stairs.       Modified Rankin (Stroke Patients Only) Modified Rankin (Stroke Patients Only) Pre-Morbid Rankin Score: No symptoms Modified Rankin: Slight disability     Balance  Overall balance assessment: Independent                               Standardized Balance Assessment Standardized Balance Assessment : Dynamic Gait Index   Dynamic Gait Index Level Surface: Normal Change in Gait Speed: Normal Gait with Horizontal Head Turns: Normal Gait with Vertical Head Turns: Normal Gait and Pivot Turn: Normal Step Over Obstacle: Normal Step Around Obstacles: Normal Steps: Normal Total Score: 24       Pertinent Vitals/Pain Pain Assessment: No/denies pain    Home Living Family/patient expects to be discharged to:: Private residence Living Arrangements: Other relatives (sister) Available Help at Discharge: Family;Available PRN/intermittently (sister works) Type of Home: House Home Access: Stairs to enter Entrance Stairs-Rails: None Technical brewer of Steps: 2 Home Layout: One level Home Equipment: None Additional Comments: Wears glasses at all times to help her see distances    Prior Function Level of Independence: Independent         Comments: pt works full time     Journalist, newspaper   Dominant Hand: Right    Extremity/Trunk Assessment   Upper Extremity Assessment: Defer to OT evaluation           Lower Extremity Assessment: Overall WFL for tasks assessed (strength, coordination, and sensation WNL per seated assessm)      Cervical / Trunk Assessment: Normal  Communication   Communication: No difficulties  Cognition Arousal/Alertness: Awake/alert Behavior During Therapy: WFL for tasks assessed/performed Overall Cognitive Status:  Within Functional Limits for tasks assessed (pt self reflects her processing is slow, but accurate)                      General Comments General comments (skin integrity, edema, etc.): Reviewed stroke education with pt re: signs and symptoms of a stroke and why it is important to come to the hospital without delay.           Assessment/Plan    PT Assessment Patent does  not need any further PT services  PT Diagnosis Hemiplegia non-dominant side;Generalized weakness         PT Goals (Current goals can be found in the Care Plan section) Acute Rehab PT Goals Patient Stated Goal: to go home, return to normal PT Goal Formulation: All assessment and education complete, DC therapy     End of Session   Activity Tolerance: Patient tolerated treatment well Patient left: in bed;with call bell/phone within reach;with family/visitor present           Time: [id number] PT Time Calculation (min) (ACUTE ONLY): 16 min   Charges:   PT Evaluation $Initial PT Evaluation Tier I: 1 Procedure          Joyous Gleghorn B. Shatora Weatherbee, PT, DPT [phone]   10/01/2014, 3:04 PM

## 2014-10-01 NOTE — Progress Notes (Signed)
Comments:  Discussed pt w/ neuro-hospitalists Dr Leonel Ramsay. He asked that Cardene qtt be d/c'd as pt's last several SBP's have all been < 200 and the last last several DBP's have been < 100. Would recommend more permissive HTN in setting of acute stroke and would recommend use of PRN IV Labetalol for SBP > 200 and or DBP > 120. Spoke with RN who relates that a first attempt to wean Cardene in ED resulted in SBP > 200. She agreed to attempt to titrate qtt to off as BP tolerates within new parameters. If able to titrate Cardene qtt to off, will transfer pt to telemetry floor per Dr Cecil Cobbs recommendation (preferably 4N). Pt remains stable at this time. Will continue to monitor closely in neuro-ICU until Cardene qtt has been discontinued.   Jeryl Columbia, NP-C [facility] Pager [phone]

## 2014-10-01 NOTE — Progress Notes (Signed)
Inpatient Diabetes Program Recommendations  AACE/ADA: New Consensus Statement on Inpatient Glycemic Control (2013)  Target Ranges:  Prepandial:   less than 140 mg/dL      Peak postprandial:   less than 180 mg/dL (1-2 hours)      Critically ill patients:  140 - 180 mg/dL   Results for Amber Young, Amber Young (MRN [id number]) as of 10/01/2014 13:06  Ref. Range 09/30/2014 19:44 09/30/2014 23:13 10/01/2014 06:50 10/01/2014 11:34  Glucose-Capillary Latest Range: 70-99 mg/dL 370 (H) 172 (H) 75 107 (H)    Diabetes history: DM2 Outpatient Diabetes medications: Metformin 1000 mg BID, Levemir 40 units once every three days, Novolog 10 units QAM with breakfast Current orders for Inpatient glycemic control: Levemir 40 units QHS, Novolog 0-9 units TID with meals, Novolog 0-5 units HS, Novolog 10 units QPM with supper  Inpatient Diabetes Program Recommendations Insulin - Basal: Please consider decreasing Lantus to 33 units QHS (based on 84 kg x 0.4 units). HgbA1C: A1C has been ordered.  Insulin-Meal Coverage: May want to consider ordering Novolog 4 units TID with meals for meal coverage instead of Novolog 10 units QPM with supper if post prandial glucose is consistently elevated.  Note: Spoke with patient about diabetes and home regimen for diabetes control. Patient reports that she is followed by her PCP (Dr. Criss Rosales) for diabetes management and currently she takes Metformin 1000 mg BID, Levemir 40 units in the morning every 3 days, Novolog 10 units with breakfast as an outpatient for diabetes control.  According to the patient she was just started on Levemir and Novolog about 2 weeks ago. Patient confirms that she only takes the Levemir once every 3 days and that she is taking the Novolog 10 units with breakfast instead of with supper. Unclear why she was instructed to take Levemir once every 3 days instead of daily. Inquired about blood glucose as an outpatient. Patient states that she checks her glucose once a day in  the morning and it is usually in the 200's mg/dl range. Inquired about any hypoglycemia and patient states that her blood sugar has never been low that she knows of. Discussed hypoglycemic and potential signs and symptoms along with treatment. Patient reports that she got "real sweaty last night after getting the Levemir, but I thought I was just having a hot flash". Since patient did not report symptoms or call nursing staff, unsure if sweating was directly related to blood glucose.  Explained that any time she had symptoms of hypoglycemia she needed to check her glucose and treat it if it was less than 70 mg/dl. Inquired about knowledge about A1C and patient reports that she knows what an A1C is and reports that her last A1C was between 14-16% (paient is not sure of exact number).  Discussed how A1C related to actual glucose values and reviewed goal A1C and glucose goals. Discussed basic pathophysiology of DM Type 2, basic home care, importance of checking CBGs and maintaining good CBG control to prevent long-term and short-term complications. Discussed impact of nutrition, exercise, stress, sickness, and medications on diabetes control.  Discussed carbohydrates, carbohydrate goals per day and meal, along with portion sizes. Patient states that she has had diet education in the past and she knows what she needs to be eating. Stressed basic pathophysiology of how elevated blood glucose damages inner lining of blood vessels and lead to increased risk of complications with emphasis on risk of stroke.  Asked that patient check her glucose 3-4 times per day and to keep  a log of glucose values she should take with her to her follow up visits with Dr. Criss Rosales. Patient verbalized understanding of information discussed and she states that she has no further questions at this time related to diabetes.   Thanks, Barnie Alderman, RN, MSN, CCRN Diabetes Coordinator Inpatient Diabetes Program [phone] (Team  Pager) [phone] ([facility] office) [phone] [facility] office)

## 2014-10-01 NOTE — Progress Notes (Signed)
CARE MANAGEMENT NOTE 10/01/2014  Patient:  Amber Young,Amber Young   Account Number:  [id number]  Date Initiated:  10/01/2014  Documentation initiated by:  Olga Coaster  Subjective/Objective Assessment:   ADMITTED WITH STROKE     Action/Plan:   CM FOLLOWING FOR DCP   Anticipated DC Date:  10/05/2014   Anticipated DC Plan:  AWAITING FOR PT/OT EVALS FOR DISCHARGE PLANNING       Choice offered to / List presented to:            Status of service:  In process, will continue to follow Medicare Important Message given?   (If response is "NO", the following Medicare IM given date fields will be blank)  Per UR Regulation:  Reviewed for med. necessity/level of care/duration of stay  If discussed at [place] of Stay Meetings, dates discussed:    Comments:  2/22/2016Mindi Slicker RN,BSN,MHA [phone]

## 2014-10-01 NOTE — Evaluation (Signed)
Speech Language Pathology Evaluation Patient Details Name: Young Young MRN: [id number] DOB: June 26, 1956 Today's Date: 10/01/2014 Time: 1210-1223 SLP Time Calculation (min) (ACUTE ONLY): 13 min  Problem List:  Patient Active Problem List   Diagnosis Date Noted  . Stroke 09/30/2014  . Acute CVA (cerebrovascular accident) 09/30/2014  . Uncontrolled hypertension 09/30/2014  . Uncontrolled diabetes mellitus 09/30/2014  . CVA (cerebral infarction) 09/30/2014   Past Medical History:  Past Medical History  Diagnosis Date  . Diabetes mellitus   . Hypertension    Past Surgical History: History reviewed. No pertinent past surgical history. HPI:  Pt is a 59 y/o African-American female with history of hypertension and diabetes, who presents with left sided facial, arm and leg numbness; weakness in left leg. Reported her speech was "off" and her mind would go blank. Patient reports that her symptoms started on 09/29/14 about 8-9 p.m. after she woke up from a nap at 7 p.m. after she came home from work. She felt a little bit dizzy and lightheaded at that time. She went back to bed and woke up somewhere between 1-3 a.m. with left-sided numbness. She felt numb in her face, arm, and left leg when she attempted to go to the bathroom. She felt like her arm was doing "funny things," as she noted that her arm was wiggling without her control. When she walked, she could not place her left leg properly due to numbness. She went back to sleep and woke up at 7:30 a.m. with still persistent left-sided numbness. She went to work, where she noted that she was dropping objects As a result she presented to the emergency department for further evaluation. She had a head CT which showed possible acute or subacute lacunar type infarct in the right basal ganglia area. She was also found to have a blood pressure of 226/106 and was started on a low dose nicardipine drip. Dr. Mingo Young, ED physician, who talked to Dr.  Nicole Young, neurology, who recommended patient be transferred to [facility] from [facility] for further care and management. Patient denies any recent fevers, chills, nausea, vomiting, chest pain, shortness of breath, abdominal pain, diarrhea, headaches or vision changes. MRI revealed No proximal branch occlusion identified within the intracranial circulation. Short-segment moderate to severe stenoses involving the bilateral P2 segments as above. No other hemodynamically significant stenosis within the intracranial circulation. Distal branch atherosclerotic irregularity within the MCA and PCA branches bilaterally.   Assessment / Plan / Recommendation Clinical Impression  Goal of session was to assess pt's speech, language and cognition skills. Pt demonstrates adequate vocal quality, articulation and volume. Pt demonstrates adequate cognition, expressive and receptive language skills. Speech will sign off.     SLP Assessment  Patient does not need any further Speech Lanaguage Pathology Services    Follow Up Recommendations  None    Frequency and Duration        Pertinent Vitals/Pain     SLP Goals     SLP Evaluation Prior Functioning  Cognitive/Linguistic Baseline: Within functional limits  Lives With: Family Vocation: Full time employment   Cognition  Overall Cognitive Status: Within Functional Limits for tasks assessed    Comprehension  Auditory Comprehension Overall Auditory Comprehension: Appears within functional limits for tasks assessed Visual Recognition/Discrimination Discrimination: Within Function Limits Reading Comprehension Reading Status: Within funtional limits    Expression Expression Primary Mode of Expression: Verbal Verbal Expression Overall Verbal Expression: Appears within functional limits for tasks assessed Initiation: No impairment Automatic Speech: Counting Repetition: No  impairment Naming: No impairment Pragmatics: No impairment Written  Expression Dominant Hand: Right Written Expression: Within Functional Limits   Oral / Motor Oral Motor/Sensory Function Overall Oral Motor/Sensory Function: Appears within functional limits for tasks assessed Labial ROM: Within Functional Limits Lingual ROM: Within Functional Limits Facial ROM: Within Functional Limits Motor Speech Overall Motor Speech: Appears within functional limits for tasks assessed   GO     Young Young 10/01/2014, 12:35 PM

## 2014-10-01 NOTE — Progress Notes (Signed)
Pt transferred  From 3M05  On a wheelchair, denies any pain at this time,pt settled in room, call light at bedside, will however continue to monitor. [name], [name]

## 2014-10-02 LAB — CBC
HEMATOCRIT: 35.2 % — AB (ref 36.0–46.0)
Hemoglobin: 11.3 g/dL — ABNORMAL LOW (ref 12.0–15.0)
MCH: 24.4 pg — ABNORMAL LOW (ref 26.0–34.0)
MCHC: 32.1 g/dL (ref 30.0–36.0)
MCV: 76 fL — ABNORMAL LOW (ref 78.0–100.0)
Platelets: 313 10*3/uL (ref 150–400)
RBC: 4.63 MIL/uL (ref 3.87–5.11)
RDW: 13.3 % (ref 11.5–15.5)
WBC: 5 10*3/uL (ref 4.0–10.5)

## 2014-10-02 LAB — BASIC METABOLIC PANEL
Anion gap: 4 — ABNORMAL LOW (ref 5–15)
BUN: 16 mg/dL (ref 6–23)
CHLORIDE: 106 mmol/L (ref 96–112)
CO2: 32 mmol/L (ref 19–32)
Calcium: 9 mg/dL (ref 8.4–10.5)
Creatinine, Ser: 0.54 mg/dL (ref 0.50–1.10)
GFR calc non Af Amer: >90 mL/min (ref >90)
GLUCOSE: 67 mg/dL — AB (ref 70–99)
Potassium: 3.7 mmol/L (ref 3.5–5.1)
Sodium: 142 mmol/L (ref 135–145)

## 2014-10-02 LAB — LIPID PANEL
Cholesterol: 243 mg/dL — ABNORMAL HIGH (ref 0–200)
HDL: 101 mg/dL (ref >39)
LDL Cholesterol: 131 mg/dL — ABNORMAL HIGH (ref 0–99)
Total CHOL/HDL Ratio: 2.4 RATIO
Triglycerides: 53 mg/dL (ref <150)
VLDL: 11 mg/dL (ref 0–40)

## 2014-10-02 LAB — GLUCOSE, CAPILLARY
GLUCOSE-CAPILLARY: 109 mg/dL — AB (ref 70–99)
Glucose-Capillary: 59 mg/dL — ABNORMAL LOW (ref 70–99)
Glucose-Capillary: 158 mg/dL — ABNORMAL HIGH (ref 70–99)

## 2014-10-02 MED ORDER — ASPIRIN EC 81 MG PO TBEC: 81.0000 mg | DELAYED_RELEASE_TABLET | Freq: Every day | ORAL | Status: DC

## 2014-10-02 MED ORDER — INSULIN DETEMIR 100 UNIT/ML ~~LOC~~ SOLN
18.0000 [IU] | Freq: Every day | SUBCUTANEOUS | Status: DC
  Filled 2014-10-02: qty 0.18

## 2014-10-02 MED ORDER — INSULIN DETEMIR 100 UNIT/ML FLEXPEN: 18.0000 [IU] | PEN_INJECTOR | Freq: Every day | SUBCUTANEOUS | Status: DC

## 2014-10-02 MED ORDER — ATORVASTATIN CALCIUM 10 MG PO TABS: 20.0000 mg | ORAL_TABLET | Freq: Every day | ORAL | Status: DC

## 2014-10-02 MED ORDER — ATORVASTATIN CALCIUM 20 MG PO TABS: 20.0000 mg | ORAL_TABLET | Freq: Every day | ORAL | Status: DC

## 2014-10-02 NOTE — Progress Notes (Addendum)
Patient given DC instructions, handouts and prescriptions. All questions answered. Patient verbalizes understanding and compliance. Patient escorted in [facility] to lobby by volunteer. Family to transport home in private vehicle.

## 2014-10-02 NOTE — Progress Notes (Signed)
STROKE TEAM PROGRESS NOTE   HISTORY Amber Young is a 59 y.o. female with a history of diabetes and hypertension who presents with left-sided weakness and numbness that was noticed on awakening in the wee hours of the morning. She noticed her left side was numb and was not acting right but went to work anyway. She noticed that she was dropping things at work and therefore presented to the emergency room where a CT shows a likely basal ganglia infarct. She has also been hypertensive and was started on a Cardene drip, though her blood pressure has been doing better.   LKW: 2/20 prior to bed tpa given?: no, outside of window  She was admitted to the neuro 4 N for further evaluation and treatment.  No issues overnight as per nursing staff.  SUBJECTIVE (INTERVAL HISTORY) Her family is at the bedside.  Overall she feels her condition slightly improved.  OBJECTIVE Temp:  [97.4 F (36.3 C)-98.7 F (37.1 C)] 97.4 F (36.3 C) (02/23 0643) Pulse Rate:  [74-92] 79 (02/23 0643) Cardiac Rhythm:  [-] Normal sinus rhythm (02/22 2000) Resp:  [20] 20 (02/23 0643) BP: (128-154)/(53-88) 137/53 mmHg (02/23 0643) SpO2:  [100 %] 100 % (02/23 0643)   Recent Labs Lab 10/01/14 1134 10/01/14 1636 10/01/14 2116 10/02/14 0645 10/02/14 0717  GLUCAP 107* 208* 283* 59* 109*    Recent Labs Lab 09/30/14 1613 10/02/14 0444  NA 138 142  K 3.7 3.7  CL 100 106  CO2 31 32  GLUCOSE 239* 67*  BUN 17 16  CREATININE 0.53 0.54  CALCIUM 9.5 9.0   No results for input(s): AST, ALT, ALKPHOS, BILITOT, PROT, ALBUMIN in the last 168 hours.  Recent Labs Lab 09/30/14 1613 10/02/14 0444  WBC 5.8 5.0  HGB 12.0 11.3*  HCT 37.5 35.2*  MCV 78.0 76.0*  PLT 373 313   No results for input(s): CKTOTAL, CKMB, CKMBINDEX, TROPONINI in the last 168 hours. No results for input(s): LABPROT, INR in the last 72 hours. No results for input(s): COLORURINE, LABSPEC, [place], GLUCOSEU, HGBUR, BILIRUBINUR, KETONESUR,  PROTEINUR, UROBILINOGEN, NITRITE, LEUKOCYTESUR in the last 72 hours.  Invalid input(s): APPERANCEUR     Component Value Date/Time   CHOL 243* 10/02/2014 0444   TRIG 53 10/02/2014 0444   HDL 101 10/02/2014 0444   CHOLHDL 2.4 10/02/2014 0444   VLDL 11 10/02/2014 0444   LDLCALC 131* 10/02/2014 0444   No results found for: HGBA1C    Component Value Date/Time   LABOPIA NONE DETECTED 09/30/2014 1742   COCAINSCRNUR NONE DETECTED 09/30/2014 1742   LABBENZ NONE DETECTED 09/30/2014 1742   AMPHETMU NONE DETECTED 09/30/2014 1742   THCU NONE DETECTED 09/30/2014 1742   LABBARB NONE DETECTED 09/30/2014 1742    No results for input(s): ETH in the last 168 hours.  Dg Chest 2 View  09/30/2014   CLINICAL DATA:  Diffuse of body numbness today.  EXAM: CHEST  2 VIEW  COMPARISON:  02/03/2013.  FINDINGS: Normal sized heart. Minimal linear scarring at both lateral costophrenic angles. Otherwise, clear lungs. Minimal bilateral shoulder degenerative changes and mild thoracic spine degenerative changes.  IMPRESSION: No acute abnormality.   Electronically Signed   By: Claudie Revering M.D.   On: 09/30/2014 16:30   Ct Head Wo Contrast  09/30/2014   CLINICAL DATA:  Woke up yesterday with left arm and leg weakness and left-sided facial numbness  EXAM: CT HEAD WITHOUT CONTRAST  TECHNIQUE: Contiguous axial images were obtained from the base of the skull through  the vertex without intravenous contrast.  COMPARISON:  None.  FINDINGS: The ventricles are normal in size and configuration. No extra-axial fluid collections are identified. The gray-white differentiation is normal. No CT findings for hemispheric infarction an or intracranial hemorrhage. There is a small focus of low-attenuation in the right basal ganglia region which could be an acute or subacute lacunar type infarct. No hemorrhage. No mass lesions. The brainstem and cerebellum are grossly normal.  The bony structures are intact. The paranasal sinuses and mastoid air  cells are clear. The globes are intact.  IMPRESSION: Possible acute or subacute lacunar type infarct in the right basal ganglia area. MRI with diffusion-weighted imaging may be helpful for further evaluation.  No findings for hemispheric infarction or intracranial hemorrhage.   Electronically Signed   By: Marijo Sanes M.D.   On: 09/30/2014 16:37   Mr Brain Wo Contrast  10/01/2014   EXAM: MRI HEAD WITHOUT CONTRAST  MRA HEAD WITHOUT CONTRAST  TECHNIQUE: Multiplanar, multiecho pulse sequences of the brain and surrounding structures were obtained without intravenous contrast. Angiographic images of the head were obtained using MRA technique without contrast.  COMPARISON:  Prior CT from 09/30/2013  FINDINGS: MRI HEAD FINDINGS  Cerebral volume within normal limits for patient age. Mild scattered and confluent T2/FLAIR hyperintensity within the periventricular and deep white matter both cerebral hemispheres noted, most compatible with mild chronic small vessel ischemic disease. No remote infarct identified.  There is an 11 mm focus of restricted diffusion involving the lateral right thalamus, compatible with an acute ischemic infarcts. No associated hemorrhage or significant mass effect. No other intracranial hemorrhage.  No mass lesion or midline shift. No hydrocephalus. No extra-axial fluid collection.  Craniocervical junction within normal limits. Pituitary gland is normal.  No acute abnormality seen about the orbits.  Paranasal sinuses are clear.  No mastoid effusion.  Bone marrow signal intensity is normal. Scalp soft tissues within normal limits.  MRA HEAD FINDINGS  ANTERIOR CIRCULATION:  Visualized distal cervical segments of the internal carotid arteries are widely patent with antegrade flow. The petrous, cavernous, and supra clinoid segments widely patent. Left A1 segment is dominant and widely patent. There is focal mild to moderate stenosis at the origin of the right A1 segment from the right ICA terminus.  A2 segments well opacified bilaterally.  M1 segments widely patent without proximal branch occlusion or significant stenosis. MCA bifurcations normal. Mild distal branch ear irregularity present within the MCA branches bilaterally.  POSTERIOR CIRCULATION  Vertebral arteries are codominant and widely patent to the vertebrobasilar junction. Cysts posterior inferior cerebellar arteries are patent bilaterally. The basilar artery widely patent. Right anterior inferior cerebellar artery is dominant. Superior cerebellar arteries patent. P1 segments widely patent. There is a short-segment moderate to severe stenosis within the proximal left P2 segment. Additional short-segment moderate to severe stenosis present slightly more distally within the right P2 segment. Distal branch irregularity present within the posterior cerebral arteries bilaterally.  No aneurysm or vascular malformation.  IMPRESSION: MRI HEAD IMPRESSION:  1. 11 mm acute ischemic infarct involving the lateral right thalamus without associated hemorrhage or mass effect. 2. Mild chronic small vessel ischemic disease involving the supratentorial white matter.  MRA HEAD IMPRESSION:  1. No proximal branch occlusion identified within the intracranial circulation. 2. Short-segment moderate to severe stenoses involving the bilateral P2 segments as above. 3. No other hemodynamically significant stenosis within the intracranial circulation. 4. Distal branch atherosclerotic irregularity within the MCA and PCA branches bilaterally.   Electronically Signed  By: Jeannine Boga M.D.   On: 10/01/2014 03:31   Mr Jodene Nam Head/brain Wo Cm  10/01/2014   EXAM: MRI HEAD WITHOUT CONTRAST  MRA HEAD WITHOUT CONTRAST  TECHNIQUE: Multiplanar, multiecho pulse sequences of the brain and surrounding structures were obtained without intravenous contrast. Angiographic images of the head were obtained using MRA technique without contrast.  COMPARISON:  Prior CT from 09/30/2013   FINDINGS: MRI HEAD FINDINGS  Cerebral volume within normal limits for patient age. Mild scattered and confluent T2/FLAIR hyperintensity within the periventricular and deep white matter both cerebral hemispheres noted, most compatible with mild chronic small vessel ischemic disease. No remote infarct identified.  There is an 11 mm focus of restricted diffusion involving the lateral right thalamus, compatible with an acute ischemic infarcts. No associated hemorrhage or significant mass effect. No other intracranial hemorrhage.  No mass lesion or midline shift. No hydrocephalus. No extra-axial fluid collection.  Craniocervical junction within normal limits. Pituitary gland is normal.  No acute abnormality seen about the orbits.  Paranasal sinuses are clear.  No mastoid effusion.  Bone marrow signal intensity is normal. Scalp soft tissues within normal limits.  MRA HEAD FINDINGS  ANTERIOR CIRCULATION:  Visualized distal cervical segments of the internal carotid arteries are widely patent with antegrade flow. The petrous, cavernous, and supra clinoid segments widely patent. Left A1 segment is dominant and widely patent. There is focal mild to moderate stenosis at the origin of the right A1 segment from the right ICA terminus. A2 segments well opacified bilaterally.  M1 segments widely patent without proximal branch occlusion or significant stenosis. MCA bifurcations normal. Mild distal branch ear irregularity present within the MCA branches bilaterally.  POSTERIOR CIRCULATION  Vertebral arteries are codominant and widely patent to the vertebrobasilar junction. Cysts posterior inferior cerebellar arteries are patent bilaterally. The basilar artery widely patent. Right anterior inferior cerebellar artery is dominant. Superior cerebellar arteries patent. P1 segments widely patent. There is a short-segment moderate to severe stenosis within the proximal left P2 segment. Additional short-segment moderate to severe stenosis  present slightly more distally within the right P2 segment. Distal branch irregularity present within the posterior cerebral arteries bilaterally.  No aneurysm or vascular malformation.  IMPRESSION: MRI HEAD IMPRESSION:  1. 11 mm acute ischemic infarct involving the lateral right thalamus without associated hemorrhage or mass effect. 2. Mild chronic small vessel ischemic disease involving the supratentorial white matter.  MRA HEAD IMPRESSION:  1. No proximal branch occlusion identified within the intracranial circulation. 2. Short-segment moderate to severe stenoses involving the bilateral P2 segments as above. 3. No other hemodynamically significant stenosis within the intracranial circulation. 4. Distal branch atherosclerotic irregularity within the MCA and PCA branches bilaterally.   Electronically Signed   By: Jeannine Boga M.D.   On: 10/01/2014 03:31   PHYSICAL EXAM Constitutional: Appears well-developed and well-nourished.  Psych: Affect appropriate to situation Eyes: No scleral injection HENT: No OP obstrucion Head: Normocephalic.  Cardiovascular: Normal rate and regular rhythm.  Respiratory: Effort normal and breath sounds normal to anterior ascultation GI: Soft. No distension. There is no tenderness.  Skin: WDI  Neuro: Mental Status: Patient is awake, alert, oriented to person, place, month, year, and situation. Patient is able to give a clear and coherent history. No signs of aphasia or neglect Cranial Nerves: II: Visual Fields are full. Pupils are equal, round, and reactive to light. III,IV, VI: EOMI without ptosis or diploplia.  V: Facial sensation is symmetric to temperature VII: Facial movement is symmetric.  VIII: hearing is intact to voice X: Uvula elevates symmetrically XI: Shoulder shrug is symmetric. XII: tongue is midline without atrophy or fasciculations.  Motor: Tone is normal. Bulk is normal. She has mild 4+/5 strength of the left arm and  leg.diminished fine finger movements on left. Orbits right over left upper extremity Sensory: Sensation intact b/l  Deep Tendon Reflexes: 2+ and symmetric in the biceps and patellae.  Plantars: Toes are downgoing bilaterally.  Cerebellar: FNF and HKS are intact on the right, she does have some mild dysmetria of the left arm out of proportion to weakness  ASSESSMENT/PLAN Ms. Jeyli Poppa is a 59 y.o. female with history of diabetes and hypertension  presenting with left-sided weakness and numbness . She did not receive IV t-PA due to outside of window.   Stroke 11 mm acute ischemic infarct involving the lateral right thalamus secondary to small vessel disease   Resultant mild left hemiparesis.  MRI  11 mm acute ischemic infarct involving the lateral right thalamus without associated hemorrhage or mass effect. 2. Mild chronic small vessel ischemic disease involving the supratentorial white matter.  MRA  No proximal branch occlusion identified within the intracranial circulation. 2. Short-segment moderate to severe stenoses involving the bilateral P2 segments as above. 3. No other hemodynamically significant stenosis within the intracranial circulation. 4. Distal branch atherosclerotic irregularity within the MCA and PCA branches bilaterally.   Carotid Doppler  1-39% ICA stenosis. Vertebral artery flow is antegrade.   2D Echo  EF 55% No clot, no shunt, mild Dilated LA  LDL 131  HgbA1c pending  Lovenox for VTE prophylaxis  Diet Carb Modified thin liquids  no antithrombotic prior to admission, now on aspirin 325 mg orally every day   Patient counseled to be compliant with her antithrombotic medications  Ongoing aggressive stroke risk factor management  Therapy recommendations: No therapy required  Disposition: home  Hypertension  Home meds:   Diovan/HCT/Norvasc  Unstable  Patient counseled to be compliant with her blood pressure medications  Hyperlipidemia  Home  meds:  Nil of note  LDL 131, goal < 70  Start Lipitor 20 mg daily. Continue as outpatient.  Diabetes  HgbA1c Pending, goal < 7.0  Other Stroke Risk Factors  Obesity, Body mass index is 29.03 kg/(m^2).   Hospital day # 2 F/u with Dr. Leonie Man in stroke clinic in 2 months No other recommendations. We will sign off.  Patients chart and images have been reviewed. Patient seen and discussed with Dr. Kyung Bacca, PA-C Department of Neurology. [name] 10/02/2014 10:29 AM    To contact Stroke Continuity provider, please refer to [email or web address] After hours, contact General Neurology

## 2014-10-02 NOTE — Progress Notes (Addendum)
Inpatient Diabetes Program Recommendations  AACE/ADA: New Consensus Statement on Inpatient Glycemic Control (2013)  Target Ranges:  Prepandial:   less than 140 mg/dL      Peak postprandial:   less than 180 mg/dL (1-2 hours)      Critically ill patients:  140 - 180 mg/dL  Results for Amber Young, Amber Young (MRN [id number]) as of 10/02/2014 07:10  Ref. Range 10/02/2014 04:44  Glucose Latest Range: 70-99 mg/dL 67 (L)   Results for Amber Young, Amber Young (MRN [id number]) as of 10/02/2014 07:10  Ref. Range 10/01/2014 06:50 10/01/2014 11:34 10/01/2014 16:36 10/01/2014 21:16  Glucose-Capillary Latest Range: 70-99 mg/dL 75 107 (H) 208 (H) 283 (H)   Diabetes history: DM2 Outpatient Diabetes medications: Metformin 1000 mg BID, Levemir 40 units once every three days, Novolog 10 units QAM with breakfast Current orders for Inpatient glycemic control: Levemir 25 units QHS, Novolog 0-9 units TID with meals, Novolog 0-5 units HS  Inpatient Diabetes Program Recommendations Insulin - Basal: Patient received Levemir 25 units last night at bedtime. Fasting lab glucose 67 mg/dl this morning. Please consider decreasing Levemir to 20 units QHS. Insulin - Meal Coverage: Please consider ordering Novolog 4 units TID with meals for meal coverage (in addition to Novolog correction scale).  Thanks, Barnie Alderman, RN, MSN, CCRN, CDE Diabetes Coordinator Inpatient Diabetes Program [phone] (Team Pager) [phone] (AP office) [phone] [facility] office)

## 2014-10-02 NOTE — Progress Notes (Signed)
Hypoglycemic Event  CBG: 59  Treatment: 15 GM carbohydrate snack  Symptoms: None  Follow-up CBG: Time:0717 CBG Result:109  Possible Reasons for Event:   Comments/MD notified:yes    Chosen Garron L  Remember to initiate Hypoglycemia Order Set & complete

## 2014-10-02 NOTE — Evaluation (Signed)
Occupational Therapy Evaluation Patient Details Name: Amber Young MRN: [id number] DOB: November 12, 1955 Today's Date: 10/02/2014    History of Present Illness 59 y.o. female admitted to [facility] on 09/30/14 for left sided numbness and weakness.  Pt's CT revealed, "acute or subacute lacunar type infarct in the right basal ganglia area".  MRI revealed, "acute ischemic infarct involving the lateral right thalamus without associated hemorrhage or mass effect."  Further stroke workup pending.  Pt with significant PMHx of HTN and DM.     Clinical Impression   Pt presents at baseline with ADLs. Reviewed stroke education and provided fine motor exercise handout. No further acute OT needs at this time.    Follow Up Recommendations  No OT follow up    Equipment Recommendations  None recommended by OT    Recommendations for Other Services       Precautions / Restrictions Precautions Precautions: None Restrictions Weight Bearing Restrictions: No      Mobility Bed Mobility Overal bed mobility: Independent                Transfers Overall transfer level: Independent Equipment used: None                  Balance Overall balance assessment: Independent                                          ADL Overall ADL's : At baseline                                       General ADL Comments: Pt completed oral care standing at sink independently. In-room ambulation to retrieve glasses. Provided fine motor exercise handout. Pt works as a Theme park manager.     Vision Vision Assessment?: No apparent visual deficits   Perception     Praxis      Pertinent Vitals/Pain       Hand Dominance Right   Extremity/Trunk Assessment Upper Extremity Assessment Upper Extremity Assessment: Overall WFL for tasks assessed   Lower Extremity Assessment Lower Extremity Assessment: Defer to PT evaluation   Cervical / Trunk Assessment Cervical / Trunk  Assessment: Normal   Communication Communication Communication: No difficulties   Cognition Arousal/Alertness: Awake/alert Behavior During Therapy: WFL for tasks assessed/performed Overall Cognitive Status: Within Functional Limits for tasks assessed                     General Comments       Exercises Exercises:  (provided fine motor exercise handout)     Shoulder Instructions      Home Living Family/patient expects to be discharged to:: Private residence Living Arrangements: Other relatives Available Help at Discharge: Family;Available PRN/intermittently Type of Home: House Home Access: Stairs to enter [employer] of Steps: 2 Entrance Stairs-Rails: None Home Layout: One level     Bathroom Shower/Tub: Tub/shower unit         Home Equipment: None   Additional Comments: glasses for distance vision  Lives With: Family    Prior Functioning/Environment Level of Independence: Independent        Comments: works as a Theme park manager    OT Diagnosis:     OT Problem List:     OT Treatment/Interventions:      OT Goals(Current goals can be found in the  care plan section) Acute Rehab OT Goals Patient Stated Goal: not stated  OT Frequency:     Barriers to D/C:            Co-evaluation              End of Session    Activity Tolerance: Patient tolerated treatment well Patient left: in bed;with call bell/phone within reach;with family/visitor present   Time: [id number] OT Time Calculation (min): 14 min Charges:  OT General Charges $OT Visit: 1 Procedure OT Evaluation $Initial OT Evaluation Tier I: 1 Procedure G-Codes:    Hortencia Pilar 09-Oct-2014, 9:19 AM

## 2014-10-02 NOTE — Progress Notes (Signed)
Patient on IV NS@100  and MD note 2/22 stated MD wanting permissive hypertension. MD informed that BP meds held per note. Meds not DCd, nor ordered to give.

## 2014-10-03 LAB — HEMOGLOBIN A1C
Hgb A1c MFr Bld: 13.9 % — ABNORMAL HIGH (ref 4.8–5.6)
MEAN PLASMA GLUCOSE: 352 mg/dL

## 2014-10-03 NOTE — Discharge Summary (Signed)
Physician Discharge Summary  Drusilla Baumeister [id number] DOB: 07/31/1956 DOA: 09/30/2014  PCP: Elyn Peers, MD  Admit date: 09/30/2014 Discharge date: 10/03/2014  Time spent: 35 minutes  Recommendations for Outpatient Follow-up:  1. HbA1c result was pending prior to patient discharge. Please inform results to patient.  2. Patient insulin regimen was adjusted. Her levemir was change to daily.  3. Needs continue risk factors stratifications.  4. Make sure patient is taking statins. Need repeat Lipid panel.  Discharge Diagnoses:    Acute CVA (cerebrovascular accident)   Stroke   Uncontrolled hypertension   Uncontrolled diabetes mellitus   CVA (cerebral infarction)   Hypertensive emergency   Discharge Condition: Stable.   Diet recommendation: Carb modified.   Filed Weights   09/30/14 2045  Weight: 84.1 kg (185 lb 6.5 oz)    History of present illness:    Hospital Course:  Acute lacunar infarct in the right basal ganglia/left sided numbness Patient received aspirin in the emergency department, patient does not take aspirin at home.  Continue aspirin. carotid Dopplers: no significant stenosis. and 2-D echocardiogram pending.  Neurology following.  Request PT, OT, and speech therapy evaluation. hemoglobin A1c pending.  LDL at 130, discharge on statins.  Allow for permissive hypertension with a systolic blood pressure of 160-180. Resume BP medications at discharge.   Uncontrolled hypertension Off nicardipine drip, Continue home antihypertensive medications.   Uncontrolled diabetes Change levemir to 18 units, to avoid hypoglycemia. SSI.  Need continue education.  Change levemir from Q 3 days to daily. I have discontinue novolog. Continue to check CBG, if continue to be uncontrolled, consider novolog for meals coverage.   Procedures:  ECHO; no cardiac source of embolism.    Doppler; Preliminary report: 1-39% ICA stenosis. Vertebral artery flow is  antegrade.  Consultations:  Neurology  Discharge Exam: Filed Vitals:   10/02/14 1341  BP: 170/72  Pulse: 78  Temp: 99 F (37.2 C)  Resp: 18    General: No distress.  Cardiovascular: S 1, S 2 RRR Respiratory: CTA  Discharge Instructions   Discharge Instructions    Diet - low sodium heart healthy    Complete by:  As directed      Increase activity slowly    Complete by:  As directed           Discharge Medication List as of 10/02/2014  1:44 PM    START taking these medications   Details  aspirin EC 81 MG tablet Take 1 tablet (81 mg total) by mouth daily., Starting 10/02/2014, Until Discontinued, Print    atorvastatin (LIPITOR) 20 MG tablet Take 1 tablet (20 mg total) by mouth daily at 6 PM., Starting 10/02/2014, Until Discontinued, Print      CONTINUE these medications which have CHANGED   Details  Insulin Detemir (LEVEMIR) 100 UNIT/ML Pen Inject 18 Units into the skin daily at 10 pm., Starting 10/02/2014, Until Discontinued, Print      CONTINUE these medications which have NOT CHANGED   Details  amLODipine (NORVASC) 5 MG tablet Take 5 mg by mouth 2 (two) times daily., Until Discontinued, Historical Med    BIOTIN PO Take 1 tablet by mouth daily., Until Discontinued, Historical Med    metFORMIN (GLUCOPHAGE) 1000 MG tablet Take 1,000 mg by mouth 2 (two) times daily with a meal., Until Discontinued, Historical Med    valsartan-hydrochlorothiazide (DIOVAN-HCT) 160-25 MG per tablet Take 1 tablet by mouth daily., Until Discontinued, Historical Med    ondansetron (ZOFRAN) 4 MG tablet Take 1  tablet (4 mg total) by mouth every 6 (six) hours., Starting 04/12/2013, Until Discontinued, Print      STOP taking these medications     insulin aspart (NOVOLOG) 100 UNIT/ML injection        No Known Allergies Follow-up Information    Follow up with SETHI,PRAMOD, MD In 2 months.   Specialties:  Neurology, Radiology   Why:  If symptoms worsen   Contact information:   [address] [place] [address] [phone]       Follow up with SETHI,PRAMOD, MD In 2 months.   Specialties:  Neurology, Radiology   Why:  If symptoms worsen   Contact information:   [place] [address] [phone]       Follow up with Elyn Peers, MD In 1 week.   Specialty:  Family Medicine   Contact information:   [address] [place] [address] [phone]        The results of significant diagnostics from this hospitalization (including imaging, microbiology, ancillary and laboratory) are listed below for reference.    Significant Diagnostic Studies: Dg Chest 2 View  09/30/2014   CLINICAL DATA:  Diffuse of body numbness today.  EXAM: CHEST  2 VIEW  COMPARISON:  02/03/2013.  FINDINGS: Normal sized heart. Minimal linear scarring at both lateral costophrenic angles. Otherwise, clear lungs. Minimal bilateral shoulder degenerative changes and mild thoracic spine degenerative changes.  IMPRESSION: No acute abnormality.   Electronically Signed   By: Claudie Revering M.D.   On: 09/30/2014 16:30   Ct Head Wo Contrast  09/30/2014   CLINICAL DATA:  Woke up yesterday with left arm and leg weakness and left-sided facial numbness  EXAM: CT HEAD WITHOUT CONTRAST  TECHNIQUE: Contiguous axial images were obtained from the base of the skull through the vertex without intravenous contrast.  COMPARISON:  None.  FINDINGS: The ventricles are normal in size and configuration. No extra-axial fluid collections are identified. The gray-white differentiation is normal. No CT findings for hemispheric infarction an or intracranial hemorrhage. There is a small focus of low-attenuation in the right basal ganglia region which could be an acute or subacute lacunar type infarct. No hemorrhage. No mass lesions. The brainstem and cerebellum are grossly normal.  The bony structures are intact. The paranasal sinuses and mastoid air cells are clear. The globes are intact.   IMPRESSION: Possible acute or subacute lacunar type infarct in the right basal ganglia area. MRI with diffusion-weighted imaging may be helpful for further evaluation.  No findings for hemispheric infarction or intracranial hemorrhage.   Electronically Signed   By: Marijo Sanes M.D.   On: 09/30/2014 16:37   Mr Brain Wo Contrast  10/01/2014   EXAM: MRI HEAD WITHOUT CONTRAST  MRA HEAD WITHOUT CONTRAST  TECHNIQUE: Multiplanar, multiecho pulse sequences of the brain and surrounding structures were obtained without intravenous contrast. Angiographic images of the head were obtained using MRA technique without contrast.  COMPARISON:  Prior CT from 09/30/2013  FINDINGS: MRI HEAD FINDINGS  Cerebral volume within normal limits for patient age. Mild scattered and confluent T2/FLAIR hyperintensity within the periventricular and deep white matter both cerebral hemispheres noted, most compatible with mild chronic small vessel ischemic disease. No remote infarct identified.  There is an 11 mm focus of restricted diffusion involving the lateral right thalamus, compatible with an acute ischemic infarcts. No associated hemorrhage or significant mass effect. No other intracranial hemorrhage.  No mass lesion or midline  shift. No hydrocephalus. No extra-axial fluid collection.  Craniocervical junction within normal limits. Pituitary gland is normal.  No acute abnormality seen about the orbits.  Paranasal sinuses are clear.  No mastoid effusion.  Bone marrow signal intensity is normal. Scalp soft tissues within normal limits.  MRA HEAD FINDINGS  ANTERIOR CIRCULATION:  Visualized distal cervical segments of the internal carotid arteries are widely patent with antegrade flow. The petrous, cavernous, and supra clinoid segments widely patent. Left A1 segment is dominant and widely patent. There is focal mild to moderate stenosis at the origin of the right A1 segment from the right ICA terminus. A2 segments well opacified bilaterally.   M1 segments widely patent without proximal branch occlusion or significant stenosis. MCA bifurcations normal. Mild distal branch ear irregularity present within the MCA branches bilaterally.  POSTERIOR CIRCULATION  Vertebral arteries are codominant and widely patent to the vertebrobasilar junction. Cysts posterior inferior cerebellar arteries are patent bilaterally. The basilar artery widely patent. Right anterior inferior cerebellar artery is dominant. Superior cerebellar arteries patent. P1 segments widely patent. There is a short-segment moderate to severe stenosis within the proximal left P2 segment. Additional short-segment moderate to severe stenosis present slightly more distally within the right P2 segment. Distal branch irregularity present within the posterior cerebral arteries bilaterally.  No aneurysm or vascular malformation.  IMPRESSION: MRI HEAD IMPRESSION:  1. 11 mm acute ischemic infarct involving the lateral right thalamus without associated hemorrhage or mass effect. 2. Mild chronic small vessel ischemic disease involving the supratentorial white matter.  MRA HEAD IMPRESSION:  1. No proximal branch occlusion identified within the intracranial circulation. 2. Short-segment moderate to severe stenoses involving the bilateral P2 segments as above. 3. No other hemodynamically significant stenosis within the intracranial circulation. 4. Distal branch atherosclerotic irregularity within the MCA and PCA branches bilaterally.   Electronically Signed   By: Jeannine Boga M.D.   On: 10/01/2014 03:31   Mr Jodene Nam Head/brain Wo Cm  10/01/2014   EXAM: MRI HEAD WITHOUT CONTRAST  MRA HEAD WITHOUT CONTRAST  TECHNIQUE: Multiplanar, multiecho pulse sequences of the brain and surrounding structures were obtained without intravenous contrast. Angiographic images of the head were obtained using MRA technique without contrast.  COMPARISON:  Prior CT from 09/30/2013  FINDINGS: MRI HEAD FINDINGS  Cerebral volume  within normal limits for patient age. Mild scattered and confluent T2/FLAIR hyperintensity within the periventricular and deep white matter both cerebral hemispheres noted, most compatible with mild chronic small vessel ischemic disease. No remote infarct identified.  There is an 11 mm focus of restricted diffusion involving the lateral right thalamus, compatible with an acute ischemic infarcts. No associated hemorrhage or significant mass effect. No other intracranial hemorrhage.  No mass lesion or midline shift. No hydrocephalus. No extra-axial fluid collection.  Craniocervical junction within normal limits. Pituitary gland is normal.  No acute abnormality seen about the orbits.  Paranasal sinuses are clear.  No mastoid effusion.  Bone marrow signal intensity is normal. Scalp soft tissues within normal limits.  MRA HEAD FINDINGS  ANTERIOR CIRCULATION:  Visualized distal cervical segments of the internal carotid arteries are widely patent with antegrade flow. The petrous, cavernous, and supra clinoid segments widely patent. Left A1 segment is dominant and widely patent. There is focal mild to moderate stenosis at the origin of the right A1 segment from the right ICA terminus. A2 segments well opacified bilaterally.  M1 segments widely patent without proximal branch occlusion or significant stenosis. MCA bifurcations normal. Mild distal branch ear irregularity  present within the MCA branches bilaterally.  POSTERIOR CIRCULATION  Vertebral arteries are codominant and widely patent to the vertebrobasilar junction. Cysts posterior inferior cerebellar arteries are patent bilaterally. The basilar artery widely patent. Right anterior inferior cerebellar artery is dominant. Superior cerebellar arteries patent. P1 segments widely patent. There is a short-segment moderate to severe stenosis within the proximal left P2 segment. Additional short-segment moderate to severe stenosis present slightly more distally within the right  P2 segment. Distal branch irregularity present within the posterior cerebral arteries bilaterally.  No aneurysm or vascular malformation.  IMPRESSION: MRI HEAD IMPRESSION:  1. 11 mm acute ischemic infarct involving the lateral right thalamus without associated hemorrhage or mass effect. 2. Mild chronic small vessel ischemic disease involving the supratentorial white matter.  MRA HEAD IMPRESSION:  1. No proximal branch occlusion identified within the intracranial circulation. 2. Short-segment moderate to severe stenoses involving the bilateral P2 segments as above. 3. No other hemodynamically significant stenosis within the intracranial circulation. 4. Distal branch atherosclerotic irregularity within the MCA and PCA branches bilaterally.   Electronically Signed   By: Jeannine Boga M.D.   On: 10/01/2014 03:31    Microbiology: Recent Results (from the past 240 hour(s))  MRSA PCR Screening     Status: None   Collection Time: 09/30/14  8:59 PM  Result Value Ref Range Status   MRSA by PCR NEGATIVE NEGATIVE Final    Comment:        The GeneXpert MRSA Assay (FDA approved for NASAL specimens only), is one component of a comprehensive MRSA colonization surveillance program. It is not intended to diagnose MRSA infection nor to guide or monitor treatment for MRSA infections.      Labs: Basic Metabolic Panel:  Recent Labs Lab 09/30/14 1613 10/02/14 0444  NA 138 142  K 3.7 3.7  CL 100 106  CO2 31 32  GLUCOSE 239* 67*  BUN 17 16  CREATININE 0.53 0.54  CALCIUM 9.5 9.0   Liver Function Tests: No results for input(s): AST, ALT, ALKPHOS, BILITOT, PROT, ALBUMIN in the last 168 hours. No results for input(s): LIPASE, AMYLASE in the last 168 hours. No results for input(s): AMMONIA in the last 168 hours. CBC:  Recent Labs Lab 09/30/14 1613 10/02/14 0444  WBC 5.8 5.0  HGB 12.0 11.3*  HCT 37.5 35.2*  MCV 78.0 76.0*  PLT 373 313   Cardiac Enzymes: No results for input(s):  CKTOTAL, CKMB, CKMBINDEX, TROPONINI in the last 168 hours. BNP: BNP (last 3 results) No results for input(s): BNP in the last 8760 hours.  ProBNP (last 3 results) No results for input(s): PROBNP in the last 8760 hours.  CBG:  Recent Labs Lab 10/01/14 1636 10/01/14 2116 10/02/14 0645 10/02/14 0717 10/02/14 1149  GLUCAP 208* 283* 59* 109* 158*       Signed:  Mitch Arquette A  [facility] 10/03/2014, 8:34 AM

## 2014-12-06 ENCOUNTER — Ambulatory Visit (INDEPENDENT_AMBULATORY_CARE_PROVIDER_SITE_OTHER): Payer: 59 | Admitting: Neurology

## 2014-12-06 ENCOUNTER — Encounter: Payer: Self-pay | Admitting: Neurology

## 2014-12-06 VITALS — BP 142/80 | HR 87 | Ht 67.5 in | Wt 198.0 lb

## 2014-12-06 DIAGNOSIS — I639 Cerebral infarction, unspecified: Secondary | ICD-10-CM | POA: Diagnosis not present

## 2014-12-06 DIAGNOSIS — I6381 Other cerebral infarction due to occlusion or stenosis of small artery: Secondary | ICD-10-CM

## 2014-12-06 NOTE — Patient Instructions (Addendum)
I had a long d/w patient about her recent stroke, risk for recurrent stroke/TIAs, personally independently reviewed imaging studies and stroke evaluation results and answered questions.Continue aspirin 81 mg orally every day  for secondary stroke prevention and maintain strict control of hypertension with blood pressure goal below 130/90, diabetes with hemoglobin A1c goal below 6.5% and lipids with LDL cholesterol goal below 100 mg/dL. I also advised the patient to eat a healthy diet with plenty of whole grains, cereals, fruits and vegetables, exercise regularly and maintain ideal body weight Followup in the future with Ward Givens, NP in 6 months or call earlier if necessary Stroke Prevention Some medical conditions and behaviors are associated with an increased chance of having a stroke. You may prevent a stroke by making healthy choices and managing medical conditions. HOW CAN I REDUCE MY RISK OF HAVING A STROKE?   Stay physically active. Get at least 30 minutes of activity on most or all days.  Do not smoke. It may also be helpful to avoid exposure to secondhand smoke.  Limit alcohol use. Moderate alcohol use is considered to be:  No more than 2 drinks per day for men.  No more than 1 drink per day for nonpregnant women.  Eat healthy foods. This involves:  Eating 5 or more servings of fruits and vegetables a day.  Making dietary changes that address high blood pressure (hypertension), high cholesterol, diabetes, or obesity.  Manage your cholesterol levels.  Making food choices that are high in fiber and low in saturated fat, trans fat, and cholesterol may control cholesterol levels.  Take any prescribed medicines to control cholesterol as directed by your health care provider.  Manage your diabetes.  Controlling your carbohydrate and sugar intake is recommended to manage diabetes.  Take any prescribed medicines to control diabetes as directed by your health care  provider.  Control your hypertension.  Making food choices that are low in salt (sodium), saturated fat, trans fat, and cholesterol is recommended to manage hypertension.  Take any prescribed medicines to control hypertension as directed by your health care provider.  Maintain a healthy weight.  Reducing calorie intake and making food choices that are low in sodium, saturated fat, trans fat, and cholesterol are recommended to manage weight.  Stop drug abuse.  Avoid taking birth control pills.  Talk to your health care provider about the risks of taking birth control pills if you are over 47 years old, smoke, get migraines, or have ever had a blood clot.  Get evaluated for sleep disorders (sleep apnea).  Talk to your health care provider about getting a sleep evaluation if you snore a lot or have excessive sleepiness.  Take medicines only as directed by your health care provider.  For some people, aspirin or blood thinners (anticoagulants) are helpful in reducing the risk of forming abnormal blood clots that can lead to stroke. If you have the irregular heart rhythm of atrial fibrillation, you should be on a blood thinner unless there is a good reason you cannot take them.  Understand all your medicine instructions.  Make sure that other conditions (such as anemia or atherosclerosis) are addressed. SEEK IMMEDIATE MEDICAL CARE IF:   You have sudden weakness or numbness of the face, arm, or leg, especially on one side of the body.  Your face or eyelid droops to one side.  You have sudden confusion.  You have trouble speaking (aphasia) or understanding.  You have sudden trouble seeing in one or both eyes.  You have sudden trouble walking.  You have dizziness.  You have a loss of balance or coordination.  You have a sudden, severe headache with no known cause.  You have new chest pain or an irregular heartbeat. Any of these symptoms may represent a serious problem that is  an emergency. Do not wait to see if the symptoms will go away. Get medical help at once. Call your local emergency services (911 in [place] Do not drive yourself to the hospital. Document Released: 09/03/2004 Document Revised: 12/11/2013 Document Reviewed: 01/27/2013 [facility] Patient Information 2015 [place], [place]. This information is not intended to replace advice given to you by your health care provider. Make sure you discuss any questions you have with your health care provider.

## 2014-12-06 NOTE — Progress Notes (Signed)
[facility] [address] [place]. [place] [address] [phone]       OFFICE FOLLOW-UP NOTE  [name] Date of Birth:  24-Oct-1955 Medical Record Number:  [id number]   HPI: 62 year African-American lady seen today for the first office follow-up visit for hospital admission for stroke in February 2016. She presented on 09/30/14 with sudden onset of left-sided weakness and numbness upon arising from sleep. She started dropping objects from her left hand at work and had trouble walking. She was seen in the emergency room where blood pressure was found to be elevated and she was started on Cardene drip for blood pressure control. CT scan of the head suggested no acute abnormality but MRI scan showed an acute right thalamic infarct. There are mild changes of chronic small vessel disease. MRA of the brain showed no large vessel stenosis carotid ultrasound showed no significant extracranial stenosis. Transthoracic echo showed normal ejection fraction. Hemoglobin A1c was significantly elevated at 13.9%. LDL cholesterol was elevated at 131 mg percent. She was started on aspirin 81 mg daily for stroke prevention as well as Lipitor 20 mg but she was not able to tolerated and she has been switched to Crestor. She was advised aggressive diabetes and blood pressure control. She states she's done well since discharge and left-sided weakness has improved nearly completely. She still has some tingling on her fingertips and as well as around her mouth. 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 She states her blood pressure is improved since discharge. She is not been regular checking a fasting sugars. She has a visit with her primary physician to have repeat lipid profile checked after changing from Lipitor to Crestor  which he seems to be tolerating better  ROS:   14 system review of systems is positive for : fatigue,  appetite change, runny nose, cough, choking, constipation, daytime sleepiness, aching muscles, dizziness, numbness and all other systems negative PMH:  Past Medical History  Diagnosis Date  . Diabetes mellitus   . Hypertension   . Stroke 09/2014    numbness left upper lip, finger tips on left hand    Social History:  History   Social History  . Marital Status: Legally Separated    Spouse Name: N/A  . Number of Children: N/A  . Years of Education: 16   Occupational History  . hairdresser    Social History Main Topics  . Smoking status: Never Smoker   . Smokeless tobacco: Never Used  . Alcohol Use: No  . Drug Use: No  . Sexual Activity: Not on file   Other Topics Concern  . Not on file   Social History Narrative   Married, 2 children, hairdresser   Caffeine use- occasional tea or coffee   Right handed       Medications:   Current Outpatient Prescriptions on File Prior to Visit  Medication Sig Dispense Refill  . amLODipine (NORVASC) 5 MG tablet Take 5 mg by mouth 2 (two) times daily.    Marland Kitchen aspirin EC 81 MG tablet Take 1 tablet (81 mg total) by mouth daily. 30 tablet 0  . BIOTIN PO Take 1 tablet by mouth daily.    . Insulin Detemir (LEVEMIR) 100 UNIT/ML Pen Inject 18 Units into the skin daily at 10 pm. 15 mL 11  . metFORMIN (GLUCOPHAGE) 1000 MG tablet Take 1,000 mg by mouth 2 (two) times daily with  a meal.     No current facility-administered medications on file prior to visit.    Allergies:  No Known Allergies  Physical Exam General: well developed, well nourished middle aged African-American lady, seated, in no evident distress Head: head normocephalic and atraumatic.  Neck: supple with no carotid or supraclavicular bruits Cardiovascular: regular rate and rhythm, no murmurs Musculoskeletal: no deformity Skin:  no rash/petichiae Vascular:  Normal pulses all extremities Filed Vitals:   12/06/14 0915  BP: 142/80  Pulse: 87   Neurologic Exam Mental Status:  Awake and fully alert. Oriented to place and time. Recent and remote memory intact. Attention span, concentration and fund of knowledge appropriate. Mood and affect appropriate.  Cranial Nerves: Fundoscopic exam reveals sharp disc margins. Pupils equal, briskly reactive to light. Extraocular movements full without nystagmus. Visual fields full to confrontation. Hearing intact. Facial sensation intact. Face, tongue, palate moves normally and symmetrically.  Motor: Normal bulk and tone. Normal strength in all tested extremity muscles. Diminished fine finger movements on the left. Orbits right over left upper extremity. Sensory.: intact to touch ,pinprick .position and vibratory sensation.  Coordination: Rapid alternating movements normal in all extremities. Finger-to-nose and heel-to-shin performed accurately bilaterally. Gait and Station: Arises from chair without difficulty. Stance is normal. Gait demonstrates normal stride length and balance . Able to heel, toe and tandem walk without difficulty.  Reflexes: 1+ and symmetric. Toes downgoing.   NIHSS  0 Modified Rankin  1  ASSESSMENT: 71 year lady with a right thalamic infarct in February 2016 secondary to small vessel disease with vascular risk factors of diabetes, hypertension, hyperlipidemia.    PLAN:  I had a long d/w patient about her recent stroke, risk for recurrent stroke/TIAs, personally independently reviewed imaging studies and stroke evaluation results and answered questions.Continue aspirin 81 mg orally every day  for secondary stroke prevention and maintain strict control of hypertension with blood pressure goal below 130/90, diabetes with hemoglobin A1c goal below 6.5% and lipids with LDL cholesterol goal below 100 mg/dL. I also advised the patient to eat a healthy diet with plenty of whole grains, cereals, fruits and vegetables, exercise regularly and maintain ideal body weight Followup in the future with Ward Givens, NP in 6  months or call earlier if necessary  Antony Contras, MD   Note: This document was prepared with digital dictation and possible smart phrase technology. Any transcriptional errors that result from this process are unintentional

## 2015-01-24 DIAGNOSIS — Z0289 Encounter for other administrative examinations: Secondary | ICD-10-CM

## 2015-04-25 ENCOUNTER — Encounter: Payer: Self-pay | Admitting: Endocrinology

## 2015-04-25 ENCOUNTER — Ambulatory Visit (INDEPENDENT_AMBULATORY_CARE_PROVIDER_SITE_OTHER): Payer: 59 | Admitting: Endocrinology

## 2015-04-25 VITALS — BP 169/92 | HR 76 | Temp 98.0°F | Ht 67.5 in | Wt 198.0 lb

## 2015-04-25 DIAGNOSIS — E1165 Type 2 diabetes mellitus with hyperglycemia: Secondary | ICD-10-CM | POA: Diagnosis not present

## 2015-04-25 DIAGNOSIS — IMO0002 Reserved for concepts with insufficient information to code with codable children: Secondary | ICD-10-CM

## 2015-04-25 MED ORDER — INSULIN DETEMIR 100 UNIT/ML FLEXPEN: PEN_INJECTOR | SUBCUTANEOUS | Status: DC

## 2015-04-25 NOTE — Patient Instructions (Addendum)
good diet and exercise significantly improve the control of your diabetes.  please let me know if you wish to be referred to a dietician.  high blood sugar is very risky to your health.  you should see an eye doctor and dentist every year.  It is very important to get all recommended vaccinations.  controlling your blood pressure and cholesterol drastically reduces the damage diabetes does to your body.  Those who smoke should quit.  please discuss these with your doctor.  check your blood sugar twice a day.  vary the time of day when you check, between before the 3 meals, and at bedtime.  also check if you have symptoms of your blood sugar being too high or too low.  please keep a record of the readings and bring it to your next appointment here.  You can write it on any piece of paper.  please call us sooner if your blood sugar goes below 70, or if you have a lot of readings over 200.  At our office, we are fortunate to have two specialists who are happy to help you:   Leonia Reader, RN, CDE, is a diabetes educator and pump trainer.  She is here on Monday mornings, and all day Tuesday and Wednesday.  She is can help you with low blood sugar avoidance and treatment, injecting insulin, sick day management, and others.   Antonieta Iba, RD is our dietician.  She is here all day Thursday and Friday.  She can advise you about a healthy diet.  She can also help you about a variety of special diabetes situations, such as shift work, Actor, gluten-free, diet for kidney patients, traveling with diabetes, and help for those who need to gain weight.   For now, please increase the levemir to 30 units in the morning and 20 units in the evening. Please call us next week, to tell us how the blood sugar is doing. Please come back for a follow-up appointment in 1 month.

## 2015-04-25 NOTE — Progress Notes (Signed)
Subjective:    Patient ID: Amber Young, female    DOB: 01/22/1956, 59 y.o.   MRN: [id number]  HPI pt states DM was dx'ed in 2001; she has mild if any neuropathy of the lower extremities, but she has associated CVA; he has been on insulin since 2007; pt says her diet and exercise are not good; she has never had GDM, pancreatitis, severe hypoglycemia or DKA.  She says cbg's are approx 300.  It is in general higher as the day goes on.  She says she never misses the insulin.  She is hesitant to increase insulin because of hypoglycemia in the hospital earlier this year.  Past Medical History  Diagnosis Date  . Diabetes mellitus   . Hypertension   . Stroke 09/2014    numbness left upper lip, finger tips on left hand    Past Surgical History  Procedure Laterality Date  . Tubal ligation    . Partial hysterectomy  1999    fibroids    Social History   Social History  . Marital Status: Legally Separated    Spouse Name: N/A  . Number of Children: N/A  . Years of Education: 16   Occupational History  . hairdresser    Social History Main Topics  . Smoking status: Never Smoker   . Smokeless tobacco: Never Used  . Alcohol Use: No  . Drug Use: No  . Sexual Activity: Not on file   Other Topics Concern  . Not on file   Social History Narrative   Married, 2 children, hairdresser   Caffeine use- occasional tea or coffee   Right handed       Current Outpatient Prescriptions on File Prior to Visit  Medication Sig Dispense Refill  . amLODipine (NORVASC) 5 MG tablet Take 5 mg by mouth 2 (two) times daily.    Marland Kitchen aspirin EC 81 MG tablet Take 1 tablet (81 mg total) by mouth daily. 30 tablet 0  . BIOTIN PO Take 1 tablet by mouth daily.    . metFORMIN (GLUCOPHAGE) 1000 MG tablet Take 1,000 mg by mouth 2 (two) times daily with a meal.    . rosuvastatin (CRESTOR) 10 MG tablet Take 10 mg by mouth daily.     No current facility-administered medications on file prior to visit.    No  Known Allergies  Family History  Problem Relation Age of Onset  . Diabetes Mother   . Hypertension Sister   . Hypertension Brother     BP 169/92 mmHg  Pulse 76  Temp(Src) 98 F (36.7 C) (Oral)  Ht 5' 7.5" (1.715 m)  Wt 198 lb (89.812 kg)  BMI 30.54 kg/m2  SpO2 96%  Review of Systems denies blurry vision, headache, chest pain, sob, n/v, muscle cramps, excessive diaphoresis, memory loss, cold intolerance, rhinorrhea, and easy bruising.  she has weight gain and urinary frequency.    Objective:   Physical Exam VS: see vs page GEN: no distress HEAD: head: no deformity eyes: no periorbital swelling, no proptosis external nose and ears are normal mouth: no lesion seen NECK: supple, thyroid is not enlarged.  CHEST WALL: no deformity LUNGS:  Clear to auscultation.  CV: reg rate and rhythm, no murmur ABD: abdomen is soft, nontender.  no hepatosplenomegaly.  not distended.  no hernia MUSCULOSKELETAL: muscle bulk and strength are grossly normal.  no obvious joint swelling.  gait is normal and steady EXTEMITIES: no deformity.  no ulcer on the feet.  feet are of normal  color and temp.  no edema PULSES: dorsalis pedis intact bilat.  no carotid bruit NEURO:  cn 2-12 grossly intact.   readily moves all 4's.  sensation is intact to touch on the feet SKIN:  Normal texture and temperature.  No rash or suspicious lesion is visible.   NODES:  None palpable at the neck PSYCH: alert, well-oriented.  Does not appear anxious nor depressed.  outside test results are reviewed: A1c=12.6%  I have reviewed outside records, and summarized: Pt was noted to have poorly-controlled DM, and ref here.  i personally reviewed electrocardiogram tracing (04/12/13): Indication: DM Impression: normal    Assessment & Plan:  DM: severe exacerbation: we'll have to increase insulin slowly, due to her experience with hypoglycemia.   Patient is advised the following: Patient Instructions  good diet and exercise  significantly improve the control of your diabetes.  please let me know if you wish to be referred to a dietician.  high blood sugar is very risky to your health.  you should see an eye doctor and dentist every year.  It is very important to get all recommended vaccinations.  controlling your blood pressure and cholesterol drastically reduces the damage diabetes does to your body.  Those who smoke should quit.  please discuss these with your doctor.  check your blood sugar twice a day.  vary the time of day when you check, between before the 3 meals, and at bedtime.  also check if you have symptoms of your blood sugar being too high or too low.  please keep a record of the readings and bring it to your next appointment here.  You can write it on any piece of paper.  please call us sooner if your blood sugar goes below 70, or if you have a lot of readings over 200.  At our office, we are fortunate to have two specialists who are happy to help you:   Leonia Reader, RN, CDE, is a diabetes educator and pump trainer.  She is here on Monday mornings, and all day Tuesday and Wednesday.  She is can help you with low blood sugar avoidance and treatment, injecting insulin, sick day management, and others.   Antonieta Iba, RD is our dietician.  She is here all day Thursday and Friday.  She can advise you about a healthy diet.  She can also help you about a variety of special diabetes situations, such as shift work, Actor, gluten-free, diet for kidney patients, traveling with diabetes, and help for those who need to gain weight.   For now, please increase the levemir to 30 units in the morning and 20 units in the evening. Please call us next week, to tell us how the blood sugar is doing. Please come back for a follow-up appointment in 1 month.

## 2015-05-21 ENCOUNTER — Ambulatory Visit: Payer: No Typology Code available for payment source | Admitting: Endocrinology

## 2015-05-23 ENCOUNTER — Encounter: Payer: Self-pay | Admitting: Endocrinology

## 2015-05-23 ENCOUNTER — Encounter: Payer: Self-pay | Admitting: Dietician

## 2015-05-23 ENCOUNTER — Ambulatory Visit (INDEPENDENT_AMBULATORY_CARE_PROVIDER_SITE_OTHER): Payer: No Typology Code available for payment source | Admitting: Endocrinology

## 2015-05-23 ENCOUNTER — Encounter: Payer: 59 | Attending: Endocrinology | Admitting: Dietician

## 2015-05-23 VITALS — BP 140/86 | HR 79 | Temp 98.2°F | Ht 66.5 in | Wt 203.0 lb

## 2015-05-23 VITALS — Ht 66.5 in | Wt 203.0 lb

## 2015-05-23 DIAGNOSIS — Z713 Dietary counseling and surveillance: Secondary | ICD-10-CM | POA: Diagnosis not present

## 2015-05-23 DIAGNOSIS — E1165 Type 2 diabetes mellitus with hyperglycemia: Secondary | ICD-10-CM

## 2015-05-23 DIAGNOSIS — Z794 Long term (current) use of insulin: Secondary | ICD-10-CM

## 2015-05-23 DIAGNOSIS — IMO0002 Reserved for concepts with insufficient information to code with codable children: Secondary | ICD-10-CM

## 2015-05-23 DIAGNOSIS — E1151 Type 2 diabetes mellitus with diabetic peripheral angiopathy without gangrene: Secondary | ICD-10-CM | POA: Diagnosis not present

## 2015-05-23 LAB — POCT GLYCOSYLATED HEMOGLOBIN (HGB A1C): HEMOGLOBIN A1C: 13.9

## 2015-05-23 NOTE — Patient Instructions (Addendum)
check your blood sugar twice a day.  vary the time of day when you check, between before the 3 meals, and at bedtime.  also check if you have symptoms of your blood sugar being too high or too low.  please keep a record of the readings and bring it to your next appointment here.  You can write it on any piece of paper.  please call us sooner if your blood sugar goes below 70, or if you have a lot of readings over 200.   A different type of diabetes blood test is requested for you today.  We'll let you know about the results.   Please come back for a follow-up appointment in 1 month.

## 2015-05-23 NOTE — Progress Notes (Signed)
Diabetes Self-Management Education  Visit Type: First/Initial  Appt. Start Time: 0815 Appt. End Time: 0945  05/23/2015  [name], identified by name and date of birth, is a 59 y.o. female with a diagnosis of Diabetes: Type 2.   Patient is here alone.  She has had Type 2 diabetes for the past 15 years.  She does not remember having any past education.  She lives with her sister.  Sister has had bariatric surgery.  They support each other with diet.  Patient does most of the shopping and cooking.  She works as a Theme park manager and does not take the time to eat a meal or take her second insulin dose at times.  She requests assistance with meal planning.  She reports not liking a lot of meat and preferring vegetables.  She reports heavy fruit intake in the summer.  Decreased bread intake recently.  Still uses beverages with sugar.    Weight hx:  Lost about 50 lbs from 2012-1013 down to 175 lbs when she moved here at that time from [place].  Less stress at that time.  She has been gradually regaining this weight.  Hx also includes HTN and hyperlipidemia.  "mini stroke this last winter".  Cholesterol 243, Triglycerides 101, HDL 53, LDL 131.  She reports her A1C was >13 today.  ASSESSMENT  Height 5' 6.5" (1.689 m), weight 203 lb (92.08 kg). Body mass index is 32.28 kg/(m^2).      Diabetes Self-Management Education - 05/23/15 0824    Visit Information   Visit Type First/Initial   Initial Visit   Diabetes Type Type 2   Are you currently following a meal plan? No   Are you taking your medications as prescribed? No  hard when I work late   Date Diagnosed 2001   Health Coping   How would you rate your overall health? Fair   Psychosocial Assessment   Patient Belief/Attitude about Diabetes Defeat/Burnout  "just can't seem to get my blood sugar under control"   Self-care barriers Other (comment)  work schedule   Self-management support Doctor's office;Friends;Family   Other persons  present Patient   Patient Concerns Nutrition/Meal planning   Preferred Learning Style No preference indicated   Learning Readiness Ready   How often do you need to have someone help you when you read instructions, pamphlets, or other written materials from your doctor or pharmacy? 1 - Never   What is the last grade level you completed in school? some college   Complications   Last HgB A1C per patient/outside source 13.9 %  10/02/14   How often do you check your blood sugar? 1-2 times/day   Fasting Blood glucose range (mg/dL) 70-129   Number of hypoglycemic episodes per month 1   Can you tell when your blood sugar is low? No   Number of hyperglycemic episodes per week 4   Have you had a dilated eye exam in the past 12 months? Yes   Have you had a dental exam in the past 12 months? Yes   Are you checking your feet? Yes   How many days per week are you checking your feet? 3   Dietary Intake   Breakfast cheerios (plain or sweet) cereal and milk OR leftovers OR instant oatmeal OR smoothie (kale, carrots, coconut water, ice, blueberries or canteloupe or apple)  7:30-8   Snack (morning) boiled egg OR leftovers  9:30-10   Lunch soup and peanut butter crackers OR potato wedges OR sandwhich  OR NABS when working  [employer] (afternoon) Potato wedges or fruit or none   Dinner chicken, salad, macaroni and cheese OR fish, potato salad or mac and cheese or peas and corn OR chicken, mac & cheese, greens, deviled eggs OR peas and corn, pintoes   Snack (evening) fruit   Beverage(s) regular gingerale, water, occasional juice, sweet tea (decreased to 1 per week), coconut water   Exercise   Exercise Type ADL's   Patient Education   Previous Diabetes Education Yes (please comment)  a while ago   Disease state  Definition of diabetes, type 1 and 2, and the diagnosis of diabetes   Nutrition management  Role of diet in the treatment of diabetes and the relationship between the three main macronutrients  and blood glucose level;Food label reading, portion sizes and measuring food.;Meal options for control of blood glucose level and chronic complications.   Physical activity and exercise  Role of exercise on diabetes management, blood pressure control and cardiac health.   Monitoring Identified appropriate SMBG and/or A1C goals.;Daily foot exams;Yearly dilated eye exam   Acute complications Taught treatment of hypoglycemia - the 15 rule.   Chronic complications Relationship between chronic complications and blood glucose control;Assessed and discussed foot care and prevention of foot problems   Psychosocial adjustment Worked with patient to identify barriers to care and solutions;Helped patient identify a support system for diabetes management;Role of stress on diabetes   Personal strategies to promote health Lifestyle issues that need to be addressed for better diabetes care   Individualized Goals (developed by patient)   Nutrition Follow meal plan discussed;General guidelines for healthy choices and portions discussed   Physical Activity Exercise 3-5 times per week;30 minutes per day   Monitoring  test my blood glucose as discussed   Reducing Risk examine blood glucose patterns;treat hypoglycemia with 15 grams of carbs if blood glucose less than 70mg /dL;increase portions of nuts and seeds   Outcomes   Expected Outcomes Demonstrated interest in learning. Expect positive outcomes   Future DMSE 4-6 wks   Program Status Completed      Individualized Plan for Diabetes Self-Management Training:   Learning Objective:  Patient will have a greater understanding of diabetes self-management. Patient education plan is to attend individual and/or group sessions per assessed needs and concerns.   Plan:  Rethink what you drink.  Avoid drinking drinks that have carbohydrates. Consider increasing your activity level by walking.  Start with 15 minutes and increase slowly to 30 minutes most days of the  week. Aim for 3 Carb Choices per meal (45 grams) +/- 1 either way  Aim for 0-1 Carbs per snack if hungry  Include protein in moderation with your meals and snacks Consider reading food labels for Total Carbohydrate and Fat Grams of foods Consider checking BG at alternate times per day as directed by MD  Consider taking medication as directed by MD Consider reading, Marga Melnick Reversing diabetes. Consider checking out a diabetic/vegetarian cookbook from the [employer] to give you more meal ideas.  Expected Outcomes:  Demonstrated interest in learning. Expect positive outcomes  Education material provided: Living Well with Diabetes, Food label handouts, A1C conversion sheet, Meal plan card, My Plate, Snack sheet and Support group flyer, weight management resources, vegetarian protein choices, breakfast idease  If problems or questions, patient to contact team via:  Phone and Email  Future DSME appointment: prn

## 2015-05-23 NOTE — Patient Instructions (Signed)
Rethink what you drink.  Avoid drinking drinks that have carbohydrates. Consider increasing your activity level by walking.  Start with 15 minutes and increase slowly to 30 minutes most days of the week. Aim for 3 Carb Choices per meal (45 grams) +/- 1 either way  Aim for 0-1 Carbs per snack if hungry  Include protein in moderation with your meals and snacks Consider reading food labels for Total Carbohydrate and Fat Grams of foods Consider checking BG at alternate times per day as directed by MD  Consider taking medication as directed by MD Consider reading, Amber Young Reversing diabetes. Consider checking out a diabetic/vegetarian cookbook from the [place] [employer] to give you more meal ideas.

## 2015-05-23 NOTE — Progress Notes (Signed)
Subjective:    Patient ID: Amber Young, female    DOB: Sep 10, 1955, 59 y.o.   MRN: [id number]  HPI Pt returns for f/u of diabetes mellitus: DM type: Insulin-requiring type 2 Dx'ed: [id number] Complications: CVA Therapy: insulin since 2007 GDM: never DKA: never Severe hypoglycemia: never Pancreatitis: never Other: she takes qd insulin, due to her hesitation to take insulin Interval history: no cbg record, but states cbg's vary from 60-135.  It is in general higher as the day goes on. Past Medical History  Diagnosis Date  . Diabetes mellitus   . Hypertension   . Stroke ([place]) 09/2014    numbness left upper lip, finger tips on left hand  . Hyperlipidemia     Past Surgical History  Procedure Laterality Date  . Tubal ligation    . Partial hysterectomy  1999    fibroids    Social History   Social History  . Marital Status: Legally Separated    Spouse Name: N/A  . Number of Children: N/A  . Years of Education: 16   Occupational History  . hairdresser    Social History Main Topics  . Smoking status: Never Smoker   . Smokeless tobacco: Never Used  . Alcohol Use: No  . Drug Use: No  . Sexual Activity: Not on file   Other Topics Concern  . Not on file   Social History Narrative   Married, 2 children, hairdresser   Caffeine use- occasional tea or coffee   Right handed       Current Outpatient Prescriptions on File Prior to Visit  Medication Sig Dispense Refill  . amLODipine (NORVASC) 5 MG tablet Take 5 mg by mouth 2 (two) times daily.    Marland Kitchen aspirin EC 81 MG tablet Take 1 tablet (81 mg total) by mouth daily. 30 tablet 0  . ergocalciferol (VITAMIN D2) 50000 UNITS capsule Take 50,000 Units by mouth.    . Insulin Detemir (LEVEMIR FLEXTOUCH) 100 UNIT/ML Pen 30 units in the morning and 20 units in the evening, and pen needles 2/day 30 mL 11  . metFORMIN (GLUCOPHAGE) 1000 MG tablet Take 1,000 mg by mouth 2 (two) times daily with a meal.    . rosuvastatin (CRESTOR) 10 MG  tablet Take 10 mg by mouth daily.    . valsartan-hydrochlorothiazide (DIOVAN-HCT) 160-25 MG per tablet Take 1 tablet by mouth.    Marland Kitchen aspirin EC 81 MG tablet Take 81 mg by mouth.    Marland Kitchen BIOTIN PO Take 1 tablet by mouth daily.     No current facility-administered medications on file prior to visit.    No Known Allergies  Family History  Problem Relation Age of Onset  . Diabetes Mother   . Hypertension Sister   . Hypertension Brother     BP 140/86 mmHg  Pulse 79  Temp(Src) 98.2 F (36.8 C) (Oral)  Ht 5' 6.5" (1.689 m)  Wt 203 lb (92.08 kg)  BMI 32.28 kg/m2  SpO2 99%   Review of Systems Denies LOC    Objective:   Physical Exam VITAL SIGNS:  See vs page GENERAL: no distress SKIN:  Insulin injection sites at the anterior abdomen are normal.   Lab Results  Component Value Date   HGBA1C 13.9 05/23/2015      Assessment & Plan:  DM: a1c is much higher than would be suggested by reported cbg's.  We'll check fructosamine.  Patient is advised the following: Patient Instructions  check your blood sugar twice a day.  vary the time of day when you check, between before the 3 meals, and at bedtime.  also check if you have symptoms of your blood sugar being too high or too low.  please keep a record of the readings and bring it to your next appointment here.  You can write it on any piece of paper.  please call us sooner if your blood sugar goes below 70, or if you have a lot of readings over 200.   A different type of diabetes blood test is requested for you today.  We'll let you know about the results.   Please come back for a follow-up appointment in 1 month.

## 2015-05-27 LAB — FRUCTOSAMINE: Fructosamine: 475 umol/L — ABNORMAL HIGH (ref 190–270)

## 2015-06-04 ENCOUNTER — Ambulatory Visit: Payer: 59 | Admitting: Adult Health

## 2015-07-08 ENCOUNTER — Telehealth: Payer: Self-pay | Admitting: *Deleted

## 2015-07-08 ENCOUNTER — Ambulatory Visit: Payer: 59 | Admitting: Adult Health
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
## 2015-07-30 ENCOUNTER — Ambulatory Visit: Payer: No Typology Code available for payment source | Admitting: Endocrinology

## 2015-11-14 ENCOUNTER — Emergency Department (HOSPITAL_COMMUNITY): Payer: Self-pay

## 2015-11-14 ENCOUNTER — Inpatient Hospital Stay (HOSPITAL_COMMUNITY)
Admission: EM | Admit: 2015-11-14 | Discharge: 2015-11-17 | DRG: 291 | Disposition: A | Discharge status: Home / Self Care | Payer: Self-pay | Attending: Internal Medicine | Admitting: Internal Medicine

## 2015-11-14 ENCOUNTER — Encounter (HOSPITAL_COMMUNITY): Payer: Self-pay | Admitting: Emergency Medicine

## 2015-11-14 DIAGNOSIS — E876 Hypokalemia: Secondary | ICD-10-CM | POA: Diagnosis present

## 2015-11-14 DIAGNOSIS — J9601 Acute respiratory failure with hypoxia: Secondary | ICD-10-CM | POA: Diagnosis present

## 2015-11-14 DIAGNOSIS — I248 Other forms of acute ischemic heart disease: Secondary | ICD-10-CM | POA: Diagnosis present

## 2015-11-14 DIAGNOSIS — I110 Hypertensive heart disease with heart failure: Principal | ICD-10-CM | POA: Diagnosis present

## 2015-11-14 DIAGNOSIS — E1151 Type 2 diabetes mellitus with diabetic peripheral angiopathy without gangrene: Secondary | ICD-10-CM

## 2015-11-14 DIAGNOSIS — Z794 Long term (current) use of insulin: Secondary | ICD-10-CM

## 2015-11-14 DIAGNOSIS — IMO0002 Reserved for concepts with insufficient information to code with codable children: Secondary | ICD-10-CM | POA: Diagnosis present

## 2015-11-14 DIAGNOSIS — E1165 Type 2 diabetes mellitus with hyperglycemia: Secondary | ICD-10-CM | POA: Diagnosis present

## 2015-11-14 DIAGNOSIS — R000 Tachycardia, unspecified: Secondary | ICD-10-CM

## 2015-11-14 DIAGNOSIS — Z833 Family history of diabetes mellitus: Secondary | ICD-10-CM

## 2015-11-14 DIAGNOSIS — I5043 Acute on chronic combined systolic (congestive) and diastolic (congestive) heart failure: Secondary | ICD-10-CM | POA: Insufficient documentation

## 2015-11-14 DIAGNOSIS — Z9114 Patient's other noncompliance with medication regimen: Secondary | ICD-10-CM

## 2015-11-14 DIAGNOSIS — I5041 Acute combined systolic (congestive) and diastolic (congestive) heart failure: Secondary | ICD-10-CM | POA: Diagnosis present

## 2015-11-14 DIAGNOSIS — E785 Hyperlipidemia, unspecified: Secondary | ICD-10-CM | POA: Diagnosis present

## 2015-11-14 DIAGNOSIS — R739 Hyperglycemia, unspecified: Secondary | ICD-10-CM

## 2015-11-14 DIAGNOSIS — Z8249 Family history of ischemic heart disease and other diseases of the circulatory system: Secondary | ICD-10-CM

## 2015-11-14 DIAGNOSIS — R0602 Shortness of breath: Secondary | ICD-10-CM

## 2015-11-14 DIAGNOSIS — Z7982 Long term (current) use of aspirin: Secondary | ICD-10-CM

## 2015-11-14 DIAGNOSIS — Z90710 Acquired absence of both cervix and uterus: Secondary | ICD-10-CM

## 2015-11-14 DIAGNOSIS — Z8673 Personal history of transient ischemic attack (TIA), and cerebral infarction without residual deficits: Secondary | ICD-10-CM

## 2015-11-14 DIAGNOSIS — I509 Heart failure, unspecified: Secondary | ICD-10-CM

## 2015-11-14 DIAGNOSIS — I10 Essential (primary) hypertension: Secondary | ICD-10-CM | POA: Diagnosis present

## 2015-11-14 LAB — CBC
HEMATOCRIT: 35 % — AB (ref 36.0–46.0)
HEMATOCRIT: 34.8 % — AB (ref 36.0–46.0)
HEMOGLOBIN: 11.5 g/dL — AB (ref 12.0–15.0)
HEMOGLOBIN: 11.5 g/dL — AB (ref 12.0–15.0)
MCH: 24.7 pg — AB (ref 26.0–34.0)
MCH: 24.7 pg — AB (ref 26.0–34.0)
MCHC: 32.9 g/dL (ref 30.0–36.0)
MCHC: 33 g/dL (ref 30.0–36.0)
MCV: 75.1 fL — AB (ref 78.0–100.0)
MCV: 74.8 fL — AB (ref 78.0–100.0)
Platelets: 386 10*3/uL (ref 150–400)
Platelets: 371 10*3/uL (ref 150–400)
RBC: 4.66 MIL/uL (ref 3.87–5.11)
RBC: 4.65 MIL/uL (ref 3.87–5.11)
RDW: 13.7 % (ref 11.5–15.5)
RDW: 13.7 % (ref 11.5–15.5)
WBC: 8.1 10*3/uL (ref 4.0–10.5)
WBC: 9.1 10*3/uL (ref 4.0–10.5)

## 2015-11-14 LAB — GLUCOSE, CAPILLARY
GLUCOSE-CAPILLARY: 348 mg/dL — AB (ref 65–99)
Glucose-Capillary: 82 mg/dL (ref 65–99)

## 2015-11-14 LAB — BASIC METABOLIC PANEL
ANION GAP: 10 (ref 5–15)
BUN: 15 mg/dL (ref 6–20)
CALCIUM: 8.3 mg/dL — AB (ref 8.9–10.3)
CHLORIDE: 105 mmol/L (ref 101–111)
CO2: 25 mmol/L (ref 22–32)
Creatinine, Ser: 0.66 mg/dL (ref 0.44–1.00)
GFR calc Af Amer: >60 mL/min (ref >60)
GFR calc non Af Amer: >60 mL/min (ref >60)
GLUCOSE: 337 mg/dL — AB (ref 65–99)
Potassium: 3.3 mmol/L — ABNORMAL LOW (ref 3.5–5.1)
Sodium: 140 mmol/L (ref 135–145)

## 2015-11-14 LAB — TROPONIN I
TROPONIN I: 0.05 ng/mL — AB (ref <0.031)
Troponin I: 0.09 ng/mL — ABNORMAL HIGH (ref <0.031)
Troponin I: 0.11 ng/mL — ABNORMAL HIGH (ref <0.031)

## 2015-11-14 LAB — TSH: TSH: 1.046 u[IU]/mL (ref 0.350–4.500)

## 2015-11-14 LAB — CREATININE, SERUM
Creatinine, Ser: 0.8 mg/dL (ref 0.44–1.00)
GFR calc Af Amer: >60 mL/min (ref >60)
GFR calc non Af Amer: >60 mL/min (ref >60)

## 2015-11-14 LAB — BRAIN NATRIURETIC PEPTIDE: B Natriuretic Peptide: 217 pg/mL — ABNORMAL HIGH (ref 0.0–100.0)

## 2015-11-14 LAB — D-DIMER, QUANTITATIVE (NOT AT ARMC): D DIMER QUANT: 1.22 ug{FEU}/mL — AB (ref 0.00–0.50)

## 2015-11-14 MED ORDER — SODIUM CHLORIDE 0.9 % IV SOLN: INTRAVENOUS | Status: DC

## 2015-11-14 MED ORDER — FUROSEMIDE 10 MG/ML IJ SOLN
40.0000 mg | Freq: Once | INTRAMUSCULAR | Status: AC
  Administered 2015-11-14: 40 mg via INTRAVENOUS
  Filled 2015-11-14: qty 4

## 2015-11-14 MED ORDER — SODIUM CHLORIDE 0.9% FLUSH
3.0000 mL | Freq: Two times a day (BID) | INTRAVENOUS | Status: DC
  Administered 2015-11-15 – 2015-11-17 (×5): 3 mL via INTRAVENOUS

## 2015-11-14 MED ORDER — INSULIN ASPART PROT & ASPART (70-30 MIX) 100 UNIT/ML ~~LOC~~ SUSP
SUBCUTANEOUS | Status: AC
  Filled 2015-11-14: qty 10

## 2015-11-14 MED ORDER — POTASSIUM CHLORIDE CRYS ER 20 MEQ PO TBCR
40.0000 meq | EXTENDED_RELEASE_TABLET | Freq: Once | ORAL | Status: AC
  Administered 2015-11-14: 40 meq via ORAL
  Filled 2015-11-14: qty 2

## 2015-11-14 MED ORDER — FUROSEMIDE 10 MG/ML IJ SOLN
40.0000 mg | Freq: Two times a day (BID) | INTRAMUSCULAR | Status: DC
  Administered 2015-11-14 – 2015-11-17 (×6): 40 mg via INTRAVENOUS
  Filled 2015-11-14 (×7): qty 4

## 2015-11-14 MED ORDER — INSULIN ASPART 100 UNIT/ML ~~LOC~~ SOLN: 0.0000 [IU] | Freq: Every day | SUBCUTANEOUS | Status: DC

## 2015-11-14 MED ORDER — INSULIN ASPART 100 UNIT/ML ~~LOC~~ SOLN
0.0000 [IU] | Freq: Three times a day (TID) | SUBCUTANEOUS | Status: DC
  Administered 2015-11-15: 3 [IU] via SUBCUTANEOUS
  Administered 2015-11-16: 5 [IU] via SUBCUTANEOUS
  Administered 2015-11-16: 8 [IU] via SUBCUTANEOUS
  Administered 2015-11-16 – 2015-11-17 (×2): 3 [IU] via SUBCUTANEOUS

## 2015-11-14 MED ORDER — SODIUM CHLORIDE 0.9% FLUSH: 3.0000 mL | INTRAVENOUS | Status: DC | PRN

## 2015-11-14 MED ORDER — ROSUVASTATIN CALCIUM 10 MG PO TABS
10.0000 mg | ORAL_TABLET | Freq: Every day | ORAL | Status: DC
  Administered 2015-11-14 – 2015-11-17 (×4): 10 mg via ORAL
  Filled 2015-11-14 (×4): qty 1

## 2015-11-14 MED ORDER — SODIUM CHLORIDE 0.9 % IV SOLN: 250.0000 mL | INTRAVENOUS | Status: DC | PRN

## 2015-11-14 MED ORDER — ONDANSETRON HCL 4 MG/2ML IJ SOLN
4.0000 mg | Freq: Four times a day (QID) | INTRAMUSCULAR | Status: DC | PRN
  Administered 2015-11-14: 4 mg via INTRAVENOUS
  Filled 2015-11-14: qty 2

## 2015-11-14 MED ORDER — NITROGLYCERIN 2 % TD OINT
1.0000 [in_us] | TOPICAL_OINTMENT | Freq: Once | TRANSDERMAL | Status: AC
Start: 2015-11-14 — End: 2015-11-14
  Administered 2015-11-14: 1 [in_us] via TOPICAL
  Filled 2015-11-14: qty 1

## 2015-11-14 MED ORDER — ALBUTEROL SULFATE (2.5 MG/3ML) 0.083% IN NEBU
2.5000 mg | INHALATION_SOLUTION | Freq: Once | RESPIRATORY_TRACT | Status: AC
  Administered 2015-11-14: 2.5 mg via RESPIRATORY_TRACT
  Filled 2015-11-14: qty 3

## 2015-11-14 MED ORDER — INSULIN ASPART PROT & ASPART (70-30 MIX) 100 UNIT/ML ~~LOC~~ SUSP
25.0000 [IU] | Freq: Two times a day (BID) | SUBCUTANEOUS | Status: DC
  Administered 2015-11-14: 25 [IU] via SUBCUTANEOUS
  Filled 2015-11-14: qty 10

## 2015-11-14 MED ORDER — ACETAMINOPHEN 325 MG PO TABS: 650.0000 mg | ORAL_TABLET | ORAL | Status: DC | PRN

## 2015-11-14 MED ORDER — INSULIN ASPART 100 UNIT/ML ~~LOC~~ SOLN
11.0000 [IU] | Freq: Once | SUBCUTANEOUS | Status: AC
  Administered 2015-11-14: 11 [IU] via SUBCUTANEOUS

## 2015-11-14 MED ORDER — ASPIRIN EC 81 MG PO TBEC
81.0000 mg | DELAYED_RELEASE_TABLET | Freq: Every day | ORAL | Status: DC
  Administered 2015-11-14 – 2015-11-17 (×4): 81 mg via ORAL
  Filled 2015-11-14 (×4): qty 1

## 2015-11-14 MED ORDER — LISINOPRIL 10 MG PO TABS
10.0000 mg | ORAL_TABLET | Freq: Every day | ORAL | Status: DC
  Administered 2015-11-14 – 2015-11-15 (×2): 10 mg via ORAL
  Filled 2015-11-14 (×2): qty 1

## 2015-11-14 MED ORDER — ENOXAPARIN SODIUM 40 MG/0.4ML ~~LOC~~ SOLN
40.0000 mg | SUBCUTANEOUS | Status: DC
  Administered 2015-11-14 – 2015-11-16 (×3): 40 mg via SUBCUTANEOUS
  Filled 2015-11-14 (×3): qty 0.4

## 2015-11-14 MED ORDER — METFORMIN HCL 500 MG PO TABS
1000.0000 mg | ORAL_TABLET | Freq: Two times a day (BID) | ORAL | Status: DC
  Administered 2015-11-14 – 2015-11-15 (×2): 1000 mg via ORAL
  Filled 2015-11-14 (×2): qty 2

## 2015-11-14 MED ORDER — IPRATROPIUM-ALBUTEROL 0.5-2.5 (3) MG/3ML IN SOLN
3.0000 mL | Freq: Once | RESPIRATORY_TRACT | Status: AC
  Administered 2015-11-14: 3 mL via RESPIRATORY_TRACT
  Filled 2015-11-14: qty 3

## 2015-11-14 MED ORDER — CARVEDILOL 3.125 MG PO TABS
6.2500 mg | ORAL_TABLET | Freq: Two times a day (BID) | ORAL | Status: DC
  Administered 2015-11-14 – 2015-11-17 (×6): 6.25 mg via ORAL
  Filled 2015-11-14 (×6): qty 2

## 2015-11-14 MED ORDER — HYDRALAZINE HCL 20 MG/ML IJ SOLN
10.0000 mg | INTRAMUSCULAR | Status: DC | PRN
  Administered 2015-11-14 (×2): 10 mg via INTRAVENOUS
  Filled 2015-11-14 (×2): qty 1

## 2015-11-14 NOTE — Progress Notes (Signed)
**Note De-Identified Amber Young Obfuscation** EKG results placed in patient chart

## 2015-11-14 NOTE — Progress Notes (Signed)
2244 Received call from central telemetry that patient is having random bursts of SVT with pulse as high as 174. Current BP-154/80, P-97. Patient asymptomatic and denies any chest pain or pressure. Mid-level paged.

## 2015-11-14 NOTE — ED Notes (Signed)
Patient ambulatory to restroom  ?

## 2015-11-14 NOTE — ED Provider Notes (Signed)
CSN: [id number]     Arrival date & time 11/14/15  [id number] History   First MD Initiated Contact with Patient 11/14/15 [phone]     Chief Complaint  Patient presents with  . Shortness of Breath     HPI Pt was seen at 0715.  Per pt, c/o gradual onset and worsening of persistent SOB for the past several months, worse over the past several days. Has been associated with cough and pedal edema. Pt states she has had SOB on exertion for the past several months (ie: needs to rest when walking up steps). Pt states she "hasn't taken her meds" and "hasn't seen a doctor" in at least the past 4 months, due to lack of insurance. Denies fevers, no CP/palpitations, no abd pain, no back pain, no N/V/D.    Past Medical History  Diagnosis Date  . Diabetes mellitus   . Hypertension   . Stroke ([place]) 09/2014    numbness left upper lip, finger tips on left hand  . Hyperlipidemia    Past Surgical History  Procedure Laterality Date  . Tubal ligation    . Partial hysterectomy  1999    fibroids   Family History  Problem Relation Age of Onset  . Diabetes Mother   . Hypertension Sister   . Hypertension Brother    Social History  Substance Use Topics  . Smoking status: Never Smoker   . Smokeless tobacco: Never Used  . Alcohol Use: No    Review of Systems ROS: Statement: All systems negative except as marked or noted in the HPI; Constitutional: Negative for fever and chills. ; ; Eyes: Negative for eye pain, redness and discharge. ; ; ENMT: Negative for ear pain, hoarseness, nasal congestion, sinus pressure and sore throat. ; ; Cardiovascular: Negative for chest pain, palpitations, diaphoresis. +dyspnea and peripheral edema. ; ; Respiratory: Negative for cough, wheezing and stridor. ; ; Gastrointestinal: Negative for nausea, vomiting, diarrhea, abdominal pain, blood in stool, hematemesis, jaundice and rectal bleeding. . ; ; Genitourinary: Negative for dysuria, flank pain and hematuria. ; ; Musculoskeletal: Negative  for back pain and neck pain. Negative for swelling and trauma.; ; Skin: Negative for pruritus, rash, abrasions, blisters, bruising and skin lesion.; ; Neuro: Negative for headache, lightheadedness and neck stiffness. Negative for weakness, altered level of consciousness , altered mental status, extremity weakness, paresthesias, involuntary movement, seizure and syncope.      Allergies  Review of patient's allergies indicates no known allergies.  Home Medications   Prior to Admission medications   Medication Sig Start Date End Date Taking? Authorizing Provider  amLODipine (NORVASC) 5 MG tablet Take 5 mg by mouth 2 (two) times daily.    Historical Provider, MD  aspirin EC 81 MG tablet Take 1 tablet (81 mg total) by mouth daily. 10/02/14   Belkys A Regalado, MD  aspirin EC 81 MG tablet Take 81 mg by mouth. 04/04/15   Historical Provider, MD  BIOTIN PO Take 1 tablet by mouth daily.    Historical Provider, MD  Insulin Detemir (LEVEMIR FLEXTOUCH) 100 UNIT/ML Pen 30 units in the morning and 20 units in the evening, and pen needles 2/day 04/25/15   Renato Shin, MD  metFORMIN (GLUCOPHAGE) 1000 MG tablet Take 1,000 mg by mouth 2 (two) times daily with a meal.    Historical Provider, MD  rosuvastatin (CRESTOR) 10 MG tablet Take 10 mg by mouth daily.    Historical Provider, MD  valsartan-hydrochlorothiazide (DIOVAN-HCT) 160-25 MG per tablet Take 1 tablet by  mouth. 04/04/15   Historical Provider, MD   BP 210/108 mmHg  Pulse 112  Temp(Src) 98.4 F (36.9 C) (Oral)  Resp 20  Ht 5\' 6"  (1.676 m)  Wt 203 lb (92.08 kg)  BMI 32.78 kg/m2  SpO2 92% Physical Exam  0720: Physical examination:  Nursing notes reviewed; Vital signs and O2 SAT reviewed;  Constitutional: Well developed, Well nourished, Well hydrated, In no acute distress; Head:  Normocephalic, atraumatic; Eyes: EOMI, PERRL, No scleral icterus; ENMT: Mouth and pharynx normal, Mucous membranes moist; Neck: Supple, Full range of motion, No  lymphadenopathy; Cardiovascular: Tachycardic rate and rhythm, No gallop; Respiratory: Breath sounds coarse & equal bilaterally, scattered wheezes. No audible wheezing. Speaking full sentences with ease, Normal respiratory effort/excursion; Chest: Nontender, Movement normal; Abdomen: Soft, Nontender, Nondistended, Normal bowel sounds; Genitourinary: No CVA tenderness; Extremities: Pulses normal, No tenderness, +2 pedal edema bilat. No calf edema or asymmetry.; Neuro: AA&Ox3, Major CN grossly intact.  Speech clear. No gross focal motor or sensory deficits in extremities.; Skin: Color normal, Warm, Dry.    ED Course  Procedures (including critical care time) Labs Review  Imaging Review  I have personally reviewed and evaluated these images and lab results as part of my medical decision-making.   EKG Interpretation   Date/Time:  Thursday November 14 2015 07:10:02 EDT Ventricular Rate:  116 PR Interval:  149 QRS Duration: 90 QT Interval:  355 QTC Calculation: 493 R Axis:   77 Text Interpretation:  Sinus tachycardia Nonspecific repol abnormality,  diffuse leads Baseline wander When compared with ECG of 04/12/2013 Diffuse  Nonspecific ST and T wave abnormality is now Present Confirmed by [facility]   MD, Nunzio Cory [phone]) on 11/14/2015 7:38:36 AM      MDM  MDM Reviewed: previous chart, nursing note and vitals Reviewed previous: labs and ECG Interpretation: labs, ECG and x-ray     Results for orders placed or performed during the hospital encounter of [id number]  Basic metabolic panel  Result Value Ref Range   Sodium 140 135 - 145 mmol/L   Potassium 3.3 (L) 3.5 - 5.1 mmol/L   Chloride 105 101 - 111 mmol/L   CO2 25 22 - 32 mmol/L   Glucose, Bld 337 (H) 65 - 99 mg/dL   BUN 15 6 - 20 mg/dL   Creatinine, Ser 0.66 0.44 - 1.00 mg/dL   Calcium 8.3 (L) 8.9 - 10.3 mg/dL   GFR calc non Af Amer >60 >60 mL/min   GFR calc Af Amer >60 >60 mL/min   Anion gap 10 5 - 15  CBC  Result Value Ref Range    WBC 8.1 4.0 - 10.5 K/uL   RBC 4.66 3.87 - 5.11 MIL/uL   Hemoglobin 11.5 (L) 12.0 - 15.0 g/dL   HCT 35.0 (L) 36.0 - 46.0 %   MCV 75.1 (L) 78.0 - 100.0 fL   MCH 24.7 (L) 26.0 - 34.0 pg   MCHC 32.9 30.0 - 36.0 g/dL   RDW 13.7 11.5 - 15.5 %   Platelets 386 150 - 400 K/uL  Troponin I  Result Value Ref Range   Troponin I 0.05 (H) <0.031 ng/mL  Brain natriuretic peptide  Result Value Ref Range   B Natriuretic Peptide 217.0 (H) 0.0 - 100.0 pg/mL   Dg Chest 2 View 11/14/2015  CLINICAL DATA:  Ankle edema and persistent shortness of breath EXAM: CHEST  2 VIEW COMPARISON:  09/30/2014 FINDINGS: Interstitial opacity with Kerley lines. Trace pleural effusions and fissural thickening. Normal heart size and mediastinal  contours. No asymmetric consolidation. Pattern is consistent with CHF and agrees with the history of lower extremity swelling. There is no reported history of infectious symptoms. IMPRESSION: CHF pattern. Electronically Signed   By: Monte Fantasia M.D.   On: 11/14/2015 07:54    0910:  Potassium repleted PO. Pt given IV lasix, topical ntg, short neb with improvement in dyspnea. Sats have improved from 86% R/A to 96% on O2 3L N/C. HR and BP also improving.  Pt sitting upright, talking in full sentences easily with family at bedside, NAD. Dx and testing d/w pt and family.  Questions answered.  Verb understanding, agreeable to admit.  T/C to [facility] Dr. Roderic Palau, case discussed, including:  HPI, pertinent PM/SHx, VS/PE, dx testing, ED course and treatment:  Agreeable to admit, requests to write temporary orders, obtain inpt tele bed to team APAdmits.   Francine Graven, DO 11/18/15 [phone]

## 2015-11-14 NOTE — ED Notes (Signed)
Patient on 3 liters [place], holding 89-90 %. RT called for treatment.

## 2015-11-14 NOTE — ED Notes (Signed)
Per Dr. Thurnell Garbe - patient was given water

## 2015-11-14 NOTE — ED Notes (Signed)
Placed pt on 2L nasal cannula. Sats remained at 86%. Increased to 3L.

## 2015-11-14 NOTE — H&P (Signed)
[facility] History and Physical  Amber Young [id number] DOB: 1956-05-05 DOA: 11/14/2015  Referring physician: Francine Graven, DO  PCP: WHITE, Delmar Landau, NP   Chief Complaint: Shortness of Breath   HPI: Amber Young is a 60 y.o. female with past medical history of CVA, HTN, DM presented with complaints of gradually worsening shortness of breath with associated cough and pedal edema. Patient reports that she has had shortness of breath with exertion for quite some time now, does not quantify how long. She reports being off all her medications for several months now. Dyspnea on exertion has been present for quite some time, but she noticed a significant worsening over the past 2 days. She's had associated cough, denies fever, no chest pain. She may have had some wheezing. She is also noted worsening edema in her legs over the past week. She was evaluated the emergency room and felt to have acute CHF. Blood pressure was uncontrolled 219/112. She'll be admitted for further treatments   Review of Systems:  Pertinent positives as per HPI, otherwise negative  Past Medical History  Diagnosis Date  . Diabetes mellitus   . Hypertension   . Stroke ([place]) 09/2014    numbness left upper lip, finger tips on left hand  . Hyperlipidemia    Past Surgical History  Procedure Laterality Date  . Tubal ligation    . Partial hysterectomy  1999    fibroids   Social History:  reports that she has never smoked. She has never used smokeless tobacco. She reports that she does not drink alcohol or use illicit drugs.  No Known Allergies  Family History  Problem Relation Age of Onset  . Diabetes Mother   . Hypertension Sister   . Hypertension Brother     Prior to Admission medications   Medication Sig Start Date End Date Taking? Authorizing Provider  amLODipine (NORVASC) 5 MG tablet Take 5 mg by mouth 2 (two) times daily.   Yes Historical Provider, MD  aspirin EC 81 MG tablet Take 1  tablet (81 mg total) by mouth daily. 10/02/14  Yes Belkys A Regalado, MD  Insulin Detemir (LEVEMIR FLEXTOUCH) 100 UNIT/ML Pen 30 units in the morning and 20 units in the evening, and pen needles 2/day 04/25/15  Yes Renato Shin, MD  metFORMIN (GLUCOPHAGE) 1000 MG tablet Take 1,000 mg by mouth 2 (two) times daily with a meal.   Yes Historical Provider, MD  rosuvastatin (CRESTOR) 10 MG tablet Take 10 mg by mouth daily.   Yes Historical Provider, MD   Physical Exam: Filed Vitals:   11/14/15 0830 11/14/15 0930 11/14/15 0954 11/14/15 1500  BP: 218/108 219/112 211/91 178/92  Pulse: 104 105 100   Temp:   98 F (36.7 C) 98.6 F (37 C)  TempSrc:   Oral Oral  Resp: 13 15    Height:   5\' 6"  (1.676 m)   Weight:   91.315 kg (201 lb 5 oz)   SpO2: 91% 94% 93% 94%    Wt Readings from Last 3 Encounters:  11/14/15 91.315 kg (201 lb 5 oz)  05/23/15 92.08 kg (203 lb)  05/23/15 92.08 kg (203 lb)    General:  Appears calm and comfortable Eyes: PERRL, normal lids, irises & conjunctiva ENT: grossly normal hearing, lips & tongue Neck: no LAD, masses or thyromegaly Cardiovascular:sinus tachycardia, no m/r/g. 1+LE edema. Telemetry: SR, no arrhythmias  Respiratory: Crackles at bases. Normal respiratory effort. Abdomen: soft, ntnd Skin: no rash or induration seen on limited exam  Musculoskeletal: grossly normal tone BUE/BLE Psychiatric: grossly normal mood and affect, speech fluent and appropriate Neurologic: grossly non-focal.          Labs on Admission:  Basic Metabolic Panel:  Recent Labs Lab 11/14/15 0750  NA 140  K 3.3*  CL 105  CO2 25  GLUCOSE 337*  BUN 15  CREATININE 0.66  CALCIUM 8.3*   CBC:  Recent Labs Lab 11/14/15 0750  WBC 8.1  HGB 11.5*  HCT 35.0*  MCV 75.1*  PLT 386   Cardiac Enzymes:  Recent Labs Lab 11/14/15 0750  TROPONINI 0.05*    BNP (last 3 results)  Recent Labs  11/14/15 0750  BNP 217.0*     Radiological Exams on Admission: Dg Chest 2  View  11/14/2015  CLINICAL DATA:  Ankle edema and persistent shortness of breath EXAM: CHEST  2 VIEW COMPARISON:  09/30/2014 FINDINGS: Interstitial opacity with Kerley lines. Trace pleural effusions and fissural thickening. Normal heart size and mediastinal contours. No asymmetric consolidation. Pattern is consistent with CHF and agrees with the history of lower extremity swelling. There is no reported history of infectious symptoms. IMPRESSION: CHF pattern. Electronically Signed   By: Monte Fantasia M.D.   On: 11/14/2015 07:54    EKG: Independently reviewed. Sinus tachycardia  Assessment/Plan Active Problems:   Uncontrolled hypertension   Uncontrolled diabetes mellitus (HCC)   Acute CHF (congestive heart failure) (HCC)   Hypokalemia   Acute respiratory failure with hypoxia (HCC)   1. Acute CHF. Suspect this is diastolic. Will check echocardiogram to evaluate LV function. Start the patient on beta blockers, ACE inhibitor, continue aspirin. She'll be started on intravenous Lasix and will monitor intake and output.   2. Acute respiratory failure with hypoxia, secondary to CHF. Patient noted to have oxygen saturations at 86% on room air on arrival. She is feeling better since oxygen has been applied. Continue to wean off oxygen as tolerated. Since patient is tachycardic, will check d-dimer.  3. Hypokalemia, will replace.  4. Uncontrolled diabetes. Patient reports that she has not been taking Levemir. Will check A1c. Since she does not have insurance, Levemir/Lantus may be cost prohibitive. We'll start the patient on NovoLog 70/30. 5. Uncontrolled hypertension. Hold Norvasc at this time. Start lisinopril and Coreg. Use hydralazine when necessary. 6. Elevated troponin. Suspect this is demand ischemia in the setting of severe hypertension and CHF. She's not having any chest pain and does not have any acute EKG changes. We'll continue to cycle troponins.   Code Status: Full  DVT Prophylaxis:  lovenox Family Communication:  Discussed with patient and family at the bedside Disposition Plan:  Discharge home once improved  Time spent:  87minutes  Kathie Dike, MD  [facility] Pager [phone]

## 2015-11-14 NOTE — ED Notes (Signed)
Pt reports persistent SOB especially with exertion. Pt also reports bilateral ankle edema. Denies hx of CHF or COPD. Pt's sats 86% on RA. Denies CP.

## 2015-11-14 NOTE — Progress Notes (Signed)
Inpatient Diabetes Program Recommendations  AACE/ADA: New Consensus Statement on Inpatient Glycemic Control (2015)  Target Ranges:  Prepandial:   less than 140 mg/dL      Peak postprandial:   less than 180 mg/dL (1-2 hours)      Critically ill patients:  140 - 180 mg/dL  11/14/15 Glucose 337.  Noted pt has hx of DM.  Spoke with nurse.  RN to obtain orders from MD. Lucious Groves RD, CDE. M.Ed. Pager [phone] Inpatient Diabetes Coordinator

## 2015-11-15 ENCOUNTER — Inpatient Hospital Stay (HOSPITAL_COMMUNITY): Payer: Self-pay

## 2015-11-15 DIAGNOSIS — I5041 Acute combined systolic (congestive) and diastolic (congestive) heart failure: Secondary | ICD-10-CM

## 2015-11-15 DIAGNOSIS — I509 Heart failure, unspecified: Secondary | ICD-10-CM

## 2015-11-15 LAB — HEMOGLOBIN A1C
Hgb A1c MFr Bld: 13.7 % — ABNORMAL HIGH (ref 4.8–5.6)
MEAN PLASMA GLUCOSE: 346 mg/dL

## 2015-11-15 LAB — BASIC METABOLIC PANEL
Anion gap: 9 (ref 5–15)
BUN: 17 mg/dL (ref 6–20)
CALCIUM: 8.2 mg/dL — AB (ref 8.9–10.3)
CO2: 29 mmol/L (ref 22–32)
CREATININE: 0.88 mg/dL (ref 0.44–1.00)
Chloride: 105 mmol/L (ref 101–111)
Glucose, Bld: 83 mg/dL (ref 65–99)
Potassium: 3.3 mmol/L — ABNORMAL LOW (ref 3.5–5.1)
SODIUM: 143 mmol/L (ref 135–145)

## 2015-11-15 LAB — GLUCOSE, CAPILLARY
GLUCOSE-CAPILLARY: 110 mg/dL — AB (ref 65–99)
GLUCOSE-CAPILLARY: 183 mg/dL — AB (ref 65–99)
GLUCOSE-CAPILLARY: 178 mg/dL — AB (ref 65–99)
Glucose-Capillary: 152 mg/dL — ABNORMAL HIGH (ref 65–99)
Glucose-Capillary: 72 mg/dL (ref 65–99)

## 2015-11-15 LAB — ECHOCARDIOGRAM COMPLETE
HEIGHTINCHES: 66 in
WEIGHTICAEL: 3121.6 [oz_av]

## 2015-11-15 LAB — TROPONIN I: TROPONIN I: 0.07 ng/mL — AB (ref <0.031)

## 2015-11-15 MED ORDER — POTASSIUM CHLORIDE CRYS ER 20 MEQ PO TBCR
40.0000 meq | EXTENDED_RELEASE_TABLET | Freq: Once | ORAL | Status: AC
  Administered 2015-11-15: 40 meq via ORAL
  Filled 2015-11-15: qty 2

## 2015-11-15 MED ORDER — INSULIN ASPART PROT & ASPART (70-30 MIX) 100 UNIT/ML ~~LOC~~ SUSP
15.0000 [IU] | Freq: Two times a day (BID) | SUBCUTANEOUS | Status: DC
  Filled 2015-11-15: qty 10

## 2015-11-15 MED ORDER — INSULIN ASPART PROT & ASPART (70-30 MIX) 100 UNIT/ML ~~LOC~~ SUSP
10.0000 [IU] | Freq: Two times a day (BID) | SUBCUTANEOUS | Status: DC
  Administered 2015-11-16 – 2015-11-17 (×3): 10 [IU] via SUBCUTANEOUS
  Filled 2015-11-15: qty 10

## 2015-11-15 MED ORDER — METFORMIN HCL 500 MG PO TABS: 1000.0000 mg | ORAL_TABLET | Freq: Two times a day (BID) | ORAL | Status: DC

## 2015-11-15 MED ORDER — INSULIN ASPART PROT & ASPART (70-30 MIX) 100 UNIT/ML ~~LOC~~ SUSP
15.0000 [IU] | Freq: Two times a day (BID) | SUBCUTANEOUS | Status: DC
  Administered 2015-11-15: 15 [IU] via SUBCUTANEOUS
  Filled 2015-11-15: qty 10

## 2015-11-15 MED ORDER — LISINOPRIL 10 MG PO TABS
20.0000 mg | ORAL_TABLET | Freq: Every day | ORAL | Status: DC
  Administered 2015-11-16 – 2015-11-17 (×2): 20 mg via ORAL
  Filled 2015-11-15 (×2): qty 2

## 2015-11-15 MED ORDER — IOHEXOL 350 MG/ML SOLN
100.0000 mL | Freq: Once | INTRAVENOUS | Status: AC | PRN
  Administered 2015-11-15: 150 mL via INTRAVENOUS

## 2015-11-15 NOTE — Progress Notes (Signed)
Dose metformin given this morning after CT scan with contrast. Dr. Roderic Palau notified. Will monitor.

## 2015-11-15 NOTE — Progress Notes (Signed)
Inpatient Diabetes Program Recommendations  AACE/ADA: New Consensus Statement on Inpatient Glycemic Control (2015)  Target Ranges:  Prepandial:   less than 140 mg/dL      Peak postprandial:   less than 180 mg/dL (1-2 hours)      Critically ill patients:  140 - 180 mg/dL   Review of Glycemic Control Results for CHAR, MERTZ (MRN [id number]) as of 11/15/2015 11:23  Ref. Range 11/14/2015 18:03 11/14/2015 21:34 11/15/2015 07:58 11/15/2015 10:15  Glucose-Capillary Latest Ref Range: 65-99 mg/dL 348 (H) 82 110 (H) 152 (H)   Diabetes history: DM Type 2  Outpatient Diabetes medications: Has not been taking meds @ home Current orders for Inpatient glycemic control: Started on 70/30 25 units bid insulin + Metformin to be held x 48 post contrast.   Inpatient Diabetes Program Recommendations:  Spoke with nurse regarding Metformin. Metformin was given and nurse to inform MD and discuss lowering dose of 70/30 insulin to 15 units bid instead of 25.  Thank you, Nani Gasser. Valeda Corzine, RN, MSN, CDE Inpatient Glycemic Control Team Team Pager [phone] (8am-5pm) 11/15/2015 11:31 AM

## 2015-11-15 NOTE — Progress Notes (Signed)
[facility] PROGRESS NOTE  Amber Young [id number] DOB: [birth year] DOA: 11/14/2015 PCP: WHITE, MARSHA L, NP  Assessment/Plan: 1. Acute combined systolic and diastolic CHF.  EF of 45% with grade 2 diastolic dysfunction. Also has global hypokinesis. Continue the patient on beta blockers, ACE inhibitor, continue aspirin. Continue on intravenous Lasix and monitor intake and output. Still needs more diuresis. Continue follow-up with cardiology for further workup of cardiomyopathy. 2. Acute respiratory failure with hypoxia, secondary to CHF. Patient noted to have oxygen saturations at 86% on room air on arrival. She is feeling better since oxygen has been applied, with sats remaining in high 90's. Continue to wean off oxygen as tolerated. D-dimer is elevated at 1.2, but CT angiogram negative for PE.  3. Hypokalemia, replaced.  4. Uncontrolled diabetes. Patient reports that she has not been taking Levemir. A1c 13.7. Glucose 83 today. Since she does not have insurance, Levemir/Lantus will be cost prohibitive. Continue on NovoLog 70/30. 5. Uncontrolled hypertension. Hold Norvasc at this time. Continue lisinopril and Coreg. Use hydralazine when necessary. 6. Elevated troponin. Suspect this is demand ischemia in the setting of severe hypertension and CHF. She's not having any chest pain and does not have any acute EKG changes. Troponin 0.07 today. Can likely pursue outpatient workup with cardiology..  Code Status: full DVT prophylaxis: lovenox Family Communication: discussed with patient Disposition Plan: discharge home once improved   Consultants:  none  Procedures: Echo :- Mild LVH with LVEF approximately 45%, diffuse hypokinesis. Grade  2 diastolic dysfunction with increased LV filling pressure. Mild  left atrial enlargement. Mild mitral regurgitation. Trivial  tricuspid regurgitation.  Antibiotics:  none  HPI/Subjective: Shortness of breath is improving. Patient is able  to ambulate greater distances without becoming short of breath. No chest pain.  Objective: Filed Vitals:   11/14/15 2240 11/15/15 0601  BP: 154/80 135/70  Pulse: 97 90  Temp:  98.3 F (36.8 C)  Resp:  20    Intake/Output Summary (Last 24 hours) at 11/15/15 0653 Last data filed at 11/15/15 0610  Gross per 24 hour  Intake    480 ml  Output    300 ml  Net    180 ml   Filed Weights   11/14/15 0701 11/14/15 0954 11/15/15 0601  Weight: 92.08 kg (203 lb) 91.315 kg (201 lb 5 oz) 88.497 kg (195 lb 1.6 oz)    Exam:  General: NAD, looks comfortable Cardiovascular: RRR, S1, S2  Respiratory: clear bilaterally, No wheezing, rales or rhonchi Abdomen: soft, non tender, no distention , bowel sounds normal Musculoskeletal: 1+ edema b/l  Data Reviewed: Basic Metabolic Panel:  Recent Labs Lab 11/14/15 0750 11/14/15 1738 11/15/15 0533  NA 140  --  143  K 3.3*  --  3.3*  CL 105  --  105  CO2 25  --  29  GLUCOSE 337*  --  83  BUN 15  --  17  CREATININE 0.66 0.80 0.88  CALCIUM 8.3*  --  8.2*   Liver Function Tests: No results for input(s): AST, ALT, ALKPHOS, BILITOT, PROT, ALBUMIN in the last 168 hours. No results for input(s): LIPASE, AMYLASE in the last 168 hours. No results for input(s): AMMONIA in the last 168 hours. CBC:  Recent Labs Lab 11/14/15 0750 11/14/15 1738  WBC 8.1 9.1  HGB 11.5* 11.5*  HCT 35.0* 34.8*  MCV 75.1* 74.8*  PLT 386 371   Cardiac Enzymes:  Recent Labs Lab 11/14/15 0750 11/14/15 1738 11/14/15 2309 11/15/15 0533  TROPONINI 0.05*  0.09* 0.11* 0.07*   BNP (last 3 results)  Recent Labs  11/14/15 0750  BNP 217.0*    ProBNP (last 3 results) No results for input(s): PROBNP in the last 8760 hours.  CBG:  Recent Labs Lab 11/14/15 1803 11/14/15 2134  GLUCAP 348* 82    No results found for this or any previous visit (from the past 240 hour(s)).   Studies: Dg Chest 2 View  11/14/2015  CLINICAL DATA:  Ankle edema and persistent  shortness of breath EXAM: CHEST  2 VIEW COMPARISON:  09/30/2014 FINDINGS: Interstitial opacity with Kerley lines. Trace pleural effusions and fissural thickening. Normal heart size and mediastinal contours. No asymmetric consolidation. Pattern is consistent with CHF and agrees with the history of lower extremity swelling. There is no reported history of infectious symptoms. IMPRESSION: CHF pattern. Electronically Signed   By: Monte Fantasia M.D.   On: 11/14/2015 07:54    Scheduled Meds: . aspirin EC  81 mg Oral Daily  . carvedilol  6.25 mg Oral BID WC  . enoxaparin (LOVENOX) injection  40 mg Subcutaneous Q24H  . furosemide  40 mg Intravenous BID  . insulin aspart  0-15 Units Subcutaneous TID WC  . insulin aspart  0-5 Units Subcutaneous QHS  . insulin aspart protamine- aspart  25 Units Subcutaneous BID WC  . lisinopril  10 mg Oral Daily  . metFORMIN  1,000 mg Oral BID WC  . rosuvastatin  10 mg Oral Daily  . sodium chloride flush  3 mL Intravenous Q12H   Continuous Infusions:   Active Problems:   Uncontrolled hypertension   Uncontrolled diabetes mellitus (HCC)   Acute CHF (congestive heart failure) (HCC)   Hypokalemia   Acute respiratory failure with hypoxia ([place])    Time spent: 25 minutes    Kathie Dike, MD. [facility] Pager [phone]. If 7PM-7AM, please contact night-coverage at [email or web address], password [facility] 11/15/2015, 6:53 AM  LOS: 1 day

## 2015-11-15 NOTE — Care Management (Signed)
Spoke with patient who is self payer. Will need assistance with medications at discharge. Match voucher given to patient. Patient will be able to purchase Reli on 70/30 insulin at [facility] fro $25 and also Reli on meter and strips. Patient expressed understanding. Stated can afford $3 medication co pays. List of area providers given to patient who take unisured.

## 2015-11-15 NOTE — Progress Notes (Signed)
0031 Troponin 0.11, trending up. Mid-level notified.

## 2015-11-16 LAB — BASIC METABOLIC PANEL
Anion gap: 8 (ref 5–15)
BUN: 18 mg/dL (ref 6–20)
CHLORIDE: 102 mmol/L (ref 101–111)
CO2: 29 mmol/L (ref 22–32)
CREATININE: 0.87 mg/dL (ref 0.44–1.00)
Calcium: 7.9 mg/dL — ABNORMAL LOW (ref 8.9–10.3)
Glucose, Bld: 173 mg/dL — ABNORMAL HIGH (ref 65–99)
Potassium: 3.5 mmol/L (ref 3.5–5.1)
Sodium: 139 mmol/L (ref 135–145)

## 2015-11-16 LAB — GLUCOSE, CAPILLARY
GLUCOSE-CAPILLARY: 151 mg/dL — AB (ref 65–99)
GLUCOSE-CAPILLARY: 219 mg/dL — AB (ref 65–99)
Glucose-Capillary: 273 mg/dL — ABNORMAL HIGH (ref 65–99)
Glucose-Capillary: 150 mg/dL — ABNORMAL HIGH (ref 65–99)

## 2015-11-16 MED ORDER — ALUM & MAG HYDROXIDE-SIMETH 200-200-20 MG/5ML PO SUSP
30.0000 mL | ORAL | Status: DC | PRN
  Administered 2015-11-16: 30 mL via ORAL
  Filled 2015-11-16: qty 30

## 2015-11-16 MED ORDER — POTASSIUM CHLORIDE CRYS ER 20 MEQ PO TBCR
40.0000 meq | EXTENDED_RELEASE_TABLET | Freq: Once | ORAL | Status: AC
  Administered 2015-11-16: 40 meq via ORAL
  Filled 2015-11-16: qty 2

## 2015-11-16 NOTE — Progress Notes (Signed)
[facility] PROGRESS NOTE  Amber Young [id number] DOB: November 14, 1955 DOA: 11/14/2015 PCP: WHITE, MARSHA L, NP  Assessment/Plan: 1. Acute combined systolic and diastolic CHF.  EF of 45% with grade 2 diastolic dysfunction. Also has global hypokinesis. Continue the patient on beta blockers, ACE inhibitor, continue aspirin. Continue on intravenous Lasix and monitor intake and output. Still needs more diuresis. Continue follow-up with cardiology for further workup of cardiomyopathy. 2. Acute respiratory failure with hypoxia, secondary to CHF. Patient noted to have oxygen saturations at 86% on room air on arrival. She is feeling better since oxygen has been applied, with sats remaining in high 90's. Continue to wean off oxygen as tolerated. D-dimer is elevated at 1.2, but CT angiogram negative for PE.  3. Hypokalemia, replaced.  4. Uncontrolled diabetes. Patient reports that she has not been taking Levemir. A1c 13.7. . Since she does not have insurance, Levemir/Lantus will be cost prohibitive. Continue on NovoLog 70/30. 5. Uncontrolled hypertension. Hold Norvasc at this time. Continue lisinopril and Coreg. Use hydralazine when necessary. Blood pressure appears to be better controlled today. 6. Elevated troponin. Suspect this is demand ischemia in the setting of severe hypertension and CHF. She's not having any chest pain and does not have any acute EKG changes. Last troponin 0.07. Can likely pursue outpatient workup with cardiology.  Code Status: full DVT prophylaxis: lovenox Family Communication: discussed with patient  Disposition Plan: discharge home once improved   Consultants:  none  Procedures: Echo :- Mild LVH with LVEF approximately 45%, diffuse hypokinesis. Grade  2 diastolic dysfunction with increased LV filling pressure. Mild  left atrial enlargement. Mild mitral regurgitation. Trivial  tricuspid regurgitation.  Antibiotics:  none  HPI/Subjective: Feeling  better. Shortness of breath improving. Able to ambulate more without dyspnea on exertion  Objective: Filed Vitals:   11/15/15 2112 11/16/15 0639  BP: 156/78 141/66  Pulse: 81 76  Temp: 98.5 F (36.9 C) 98.4 F (36.9 C)  Resp: 20 20    Intake/Output Summary (Last 24 hours) at 11/16/15 0722 Last data filed at 11/16/15 0640  Gross per 24 hour  Intake    720 ml  Output   2400 ml  Net  -1680 ml   Filed Weights   11/14/15 0954 11/15/15 0601 11/16/15 0639  Weight: 91.315 kg (201 lb 5 oz) 88.497 kg (195 lb 1.6 oz) 87.89 kg (193 lb 12.2 oz)    Exam:  General: NAD, looks comfortable Cardiovascular: RRR, S1, S2  Respiratory: clear bilaterally, No wheezing, rales or rhonchi Abdomen: soft, non tender, no distention , bowel sounds normal Musculoskeletal: trace to 1+edema b/l   Data Reviewed: Basic Metabolic Panel:  Recent Labs Lab 11/14/15 0750 11/14/15 1738 11/15/15 0533  NA 140  --  143  K 3.3*  --  3.3*  CL 105  --  105  CO2 25  --  29  GLUCOSE 337*  --  83  BUN 15  --  17  CREATININE 0.66 0.80 0.88  CALCIUM 8.3*  --  8.2*   Liver Function Tests: No results for input(s): AST, ALT, ALKPHOS, BILITOT, PROT, ALBUMIN in the last 168 hours. No results for input(s): LIPASE, AMYLASE in the last 168 hours. No results for input(s): AMMONIA in the last 168 hours. CBC:  Recent Labs Lab 11/14/15 0750 11/14/15 1738  WBC 8.1 9.1  HGB 11.5* 11.5*  HCT 35.0* 34.8*  MCV 75.1* 74.8*  PLT 386 371   Cardiac Enzymes:  Recent Labs Lab 11/14/15 0750 11/14/15 1738 11/14/15 2309  11/15/15 0533  TROPONINI 0.05* 0.09* 0.11* 0.07*   BNP (last 3 results)  Recent Labs  11/14/15 0750  BNP 217.0*    ProBNP (last 3 results) No results for input(s): PROBNP in the last 8760 hours.  CBG:  Recent Labs Lab 11/15/15 0758 11/15/15 1015 11/15/15 1134 11/15/15 1635 11/15/15 2133  GLUCAP 110* 152* 183* 72 178*    No results found for this or any previous visit (from the  past 240 hour(s)).   Studies: Dg Chest 2 View  11/14/2015  CLINICAL DATA:  Ankle edema and persistent shortness of breath EXAM: CHEST  2 VIEW COMPARISON:  09/30/2014 FINDINGS: Interstitial opacity with Kerley lines. Trace pleural effusions and fissural thickening. Normal heart size and mediastinal contours. No asymmetric consolidation. Pattern is consistent with CHF and agrees with the history of lower extremity swelling. There is no reported history of infectious symptoms. IMPRESSION: CHF pattern. Electronically Signed   By: Monte Fantasia M.D.   On: 11/14/2015 07:54   Ct Angio Chest Pe W/cm &/or Wo Cm  11/15/2015  CLINICAL DATA:  60 year old female with tachycardia and shortness of breath. Lower extremity edema. Initial encounter. EXAM: CT ANGIOGRAPHY CHEST WITH CONTRAST TECHNIQUE: Multidetector CT imaging of the chest was performed using the standard protocol during bolus administration of intravenous contrast. Multiplanar CT image reconstructions and MIPs were obtained to evaluate the vascular anatomy. CONTRAST:  170mL OMNIPAQUE IOHEXOL 350 MG/ML SOLN COMPARISON:  Chest radiographs 11/14/2015 and earlier. FINDINGS: Good contrast bolus timing in the pulmonary arterial tree. No focal filling defect identified in the pulmonary arterial tree to suggest the presence of acute pulmonary embolism. Moderate bilateral layering pleural effusions. Compressive atelectasis and additional dependent bilateral pulmonary ground-glass opacity. Major airways are patent. More confluent bilateral lower lobe opacity, peribronchial. Borderline air bronchograms in the left lower lobe. Cardiomegaly. No pericardial effusion. Minimal calcified aortic atherosclerosis. There is evidence of calcified coronary artery atherosclerosis on series 5, image 42. Mild, reactive appearing mediastinal nodes. No axillary lymphadenopathy. Negative thoracic inlet aside from thyromegaly with 10 mm left thyroid lobe nodule which does not meet consensus  criteria for ultrasound follow-up. Large simple fluid density probable sebaceous cyst along the inferior right chest wall (series 5, image 64). Smaller left lower chest wall lesion on image 60. Negative visualized liver, spleen, adrenal glands, renal upper poles, and bowel in the upper abdomen. No acute osseous abnormality identified. Review of the MIP images confirms the above findings. IMPRESSION: 1.  No evidence of acute pulmonary embolus. 2. Constellation of lung findings most compatible with pulmonary edema and moderate bilateral layering pleural effusions. Associated confluent lower lobe opacity favored to be atelectasis. 3. No pericardial effusion. Calcified coronary artery and to a lesser extent aortic atherosclerosis. Electronically Signed   By: Genevie Ann M.D.   On: 11/15/2015 09:40    Scheduled Meds: . aspirin EC  81 mg Oral Daily  . carvedilol  6.25 mg Oral BID WC  . enoxaparin (LOVENOX) injection  40 mg Subcutaneous Q24H  . furosemide  40 mg Intravenous BID  . insulin aspart  0-15 Units Subcutaneous TID WC  . insulin aspart protamine- aspart  10 Units Subcutaneous BID WC  . lisinopril  20 mg Oral Daily  . [START ON 11/17/2015] metFORMIN  1,000 mg Oral BID WC  . rosuvastatin  10 mg Oral Daily  . sodium chloride flush  3 mL Intravenous Q12H   Continuous Infusions:   Active Problems:   Uncontrolled hypertension   Uncontrolled diabetes mellitus ([place])  Acute combined systolic and diastolic CHF, NYHA class 1 (HCC)   Hypokalemia   Acute respiratory failure with hypoxia ([place])    Time spent: 25 minutes    Kathie Dike, MD. [facility] Pager [phone]. If 7PM-7AM, please contact night-coverage at [email or web address], password [facility] 11/16/2015, 7:22 AM  LOS: 2 days

## 2015-11-17 LAB — GLUCOSE, CAPILLARY
GLUCOSE-CAPILLARY: 184 mg/dL — AB (ref 65–99)
GLUCOSE-CAPILLARY: 177 mg/dL — AB (ref 65–99)

## 2015-11-17 LAB — BASIC METABOLIC PANEL
ANION GAP: 7 (ref 5–15)
BUN: 19 mg/dL (ref 6–20)
CALCIUM: 8.2 mg/dL — AB (ref 8.9–10.3)
CHLORIDE: 103 mmol/L (ref 101–111)
CO2: 29 mmol/L (ref 22–32)
Creatinine, Ser: 0.72 mg/dL (ref 0.44–1.00)
GFR calc Af Amer: >60 mL/min (ref >60)
GLUCOSE: 192 mg/dL — AB (ref 65–99)
POTASSIUM: 3.7 mmol/L (ref 3.5–5.1)
SODIUM: 139 mmol/L (ref 135–145)

## 2015-11-17 MED ORDER — LISINOPRIL 20 MG PO TABS: 20.0000 mg | ORAL_TABLET | Freq: Every day | ORAL | Status: DC

## 2015-11-17 MED ORDER — BLOOD GLUCOSE TEST VI STRP: ORAL_STRIP | Status: DC

## 2015-11-17 MED ORDER — FUROSEMIDE 40 MG PO TABS: 40.0000 mg | ORAL_TABLET | Freq: Every day | ORAL | Status: DC

## 2015-11-17 MED ORDER — CARVEDILOL 6.25 MG PO TABS: 6.2500 mg | ORAL_TABLET | Freq: Two times a day (BID) | ORAL | Status: DC

## 2015-11-17 MED ORDER — INSULIN ISOPHANE & REGULAR (HUMAN 70-30)100 UNIT/ML KWIKPEN: 13.0000 [IU] | PEN_INJECTOR | Freq: Two times a day (BID) | SUBCUTANEOUS | Status: DC

## 2015-11-17 MED ORDER — INSULIN PEN NEEDLE 29G X 10MM MISC: Status: DC

## 2015-11-17 NOTE — Discharge Summary (Signed)
Physician Discharge Summary  Amber Young [id number] DOB: 03-12-1956 DOA: 11/14/2015  PCP: Jettie Booze, NP  Admit date: 11/14/2015 Discharge date: 11/17/2015  Time spent: 35 minutes  Recommendations for Outpatient Follow-up:  1. Follow up with PCP in 1-2 weeks. Repeat BMET in 1 week.  2. Follow up with cardiology in 1-2 weeks.    Discharge Diagnoses:  Active Problems:   Uncontrolled hypertension   Uncontrolled diabetes mellitus (HCC)   Acute combined systolic and diastolic CHF, NYHA class 1 (HCC)   Hypokalemia   Acute respiratory failure with hypoxia [facility])   Discharge Condition: Improved  Diet recommendation: Heart healthy  Filed Weights   11/15/15 0601 11/16/15 0639 11/17/15 0700  Weight: 88.497 kg (195 lb 1.6 oz) 87.89 kg (193 lb 12.2 oz) 89.223 kg (196 lb 11.2 oz)    History of present illness:  60 y.o. female with past medical history of CVA, HTN, and DM presented with complaints of gradually worsening shortness of breath with associated cough and pedal edema. Patient reported that she has had shortness of breath with exertion for quite some time now however it worsened in the last 2 days. She reported being off all her medications for several months now. While in the ED, she was felt to have acute CHF. Blood pressure was uncontrolled 219/112. She was admitted for further treatment.  Hospital Course:  Patient presented with acute combined systolic and diastolic CHF. ECHO was performed and revealed an EF of 45% with grade 2 diastolic dysfunction as well as global hypokinesis. She was started on beta blockers, ACE inhibitor, aspirin, and IV Lasix with significant improvement. She is now ambulatory without shortness of breath or pedal edema and feels as though she is approaching baseline. She will need to follow-up with cardiology for further workup of cardiomyopathy.  1. Acute respiratory failure with hypoxia, secondary to CHF. Patient noted to have oxygen saturations at  86% on room air on arrival. She was placed on supplemental O2 with improvement. O2 has been removed and she remains stable on RA. D-dimer is elevated at 1.2, but CT angiogram negative for PE.  2. Hypokalemia, replaced.  3. Uncontrolled diabetes. Patient reports that she has not been taking Levemir.  Her A1c 13.7. Since she does not have insurance, Levemir/Lantus will be cost prohibitive. She was started on NovoLog 70/30 with improvement in her blood sugars. She will be continued on this as an outpatient and has been educated on following her sugars closely.  4. Uncontrolled hypertension. Patient was started on lisinopril, Coreg and PRN hydralazine with improved control of her blood pressures. She will be discharged on these medications but was advised that she will require close follow up with her PCP for further management.  5. Elevated troponin. Suspect this is demand ischemia in the setting of severe hypertension and CHF. She did not have any chest pain or acute EKG changes. Last troponin 0.07. Can pursue outpatient workup with cardiology.  Procedures: Echo :- Mild LVH with LVEF approximately 45%, diffuse hypokinesis. Grade  2 diastolic dysfunction with increased LV filling pressure. Mild  left atrial enlargement. Mild mitral regurgitation. Trivial  tricuspid regurgitation.  Consultations:  none  Discharge Exam: Filed Vitals:   11/16/15 2114 11/17/15 0700  BP: 161/80 158/92  Pulse: 85 80  Temp: 99.6 F (37.6 C) 99.5 F (37.5 C)  Resp: 20 20     General: NAD, looks comfortable  Cardiovascular: RRR, S1, S2   Respiratory: clear bilaterally, No wheezing, rales or rhonchi  Abdomen:  soft, non tender, no distention , bowel sounds normal  Musculoskeletal: No edema b/l  Discharge Instructions   Discharge Instructions    Diet - low sodium heart healthy    Complete by:  As directed      Increase activity slowly    Complete by:  As directed           Current Discharge  Medication List    START taking these medications   Details  carvedilol (COREG) 6.25 MG tablet Take 1 tablet (6.25 mg total) by mouth 2 (two) times daily with a meal. Qty: 60 tablet, Refills: 1    furosemide (LASIX) 40 MG tablet Take 1 tablet (40 mg total) by mouth daily. Qty: 30 tablet, Refills: 1    Glucose Blood (BLOOD GLUCOSE TEST STRIPS) STRP Use as directed Qty: 100 each, Refills: 0    Insulin Isophane & Regular Human (HUMULIN 70/30 PEN) (70-30) 100 UNIT/ML PEN Inject 13 Units into the skin 2 (two) times daily. Qty: 15 mL, Refills: 11    Insulin Pen Needle 29G X 10MM MISC Use as directed Qty: 100 each, Refills: 0    lisinopril (PRINIVIL,ZESTRIL) 20 MG tablet Take 1 tablet (20 mg total) by mouth daily. Qty: 30 tablet, Refills: 0      CONTINUE these medications which have NOT CHANGED   Details  aspirin EC 81 MG tablet Take 1 tablet (81 mg total) by mouth daily. Qty: 30 tablet, Refills: 0    metFORMIN (GLUCOPHAGE) 1000 MG tablet Take 1,000 mg by mouth 2 (two) times daily with a meal.    rosuvastatin (CRESTOR) 10 MG tablet Take 10 mg by mouth daily.      STOP taking these medications     amLODipine (NORVASC) 5 MG tablet      Insulin Detemir (LEVEMIR FLEXTOUCH) 100 UNIT/ML Pen        No Known Allergies    The results of significant diagnostics from this hospitalization (including imaging, microbiology, ancillary and laboratory) are listed below for reference.    Significant Diagnostic Studies: Dg Chest 2 View  11/14/2015  CLINICAL DATA:  Ankle edema and persistent shortness of breath EXAM: CHEST  2 VIEW COMPARISON:  09/30/2014 FINDINGS: Interstitial opacity with Kerley lines. Trace pleural effusions and fissural thickening. Normal heart size and mediastinal contours. No asymmetric consolidation. Pattern is consistent with CHF and agrees with the history of lower extremity swelling. There is no reported history of infectious symptoms. IMPRESSION: CHF pattern.  Electronically Signed   By: Monte Fantasia M.D.   On: 11/14/2015 07:54   Ct Angio Chest Pe W/cm &/or Wo Cm  11/15/2015  CLINICAL DATA:  60 year old female with tachycardia and shortness of breath. Lower extremity edema. Initial encounter. EXAM: CT ANGIOGRAPHY CHEST WITH CONTRAST TECHNIQUE: Multidetector CT imaging of the chest was performed using the standard protocol during bolus administration of intravenous contrast. Multiplanar CT image reconstructions and MIPs were obtained to evaluate the vascular anatomy. CONTRAST:  185mL OMNIPAQUE IOHEXOL 350 MG/ML SOLN COMPARISON:  Chest radiographs 11/14/2015 and earlier. FINDINGS: Good contrast bolus timing in the pulmonary arterial tree. No focal filling defect identified in the pulmonary arterial tree to suggest the presence of acute pulmonary embolism. Moderate bilateral layering pleural effusions. Compressive atelectasis and additional dependent bilateral pulmonary ground-glass opacity. Major airways are patent. More confluent bilateral lower lobe opacity, peribronchial. Borderline air bronchograms in the left lower lobe. Cardiomegaly. No pericardial effusion. Minimal calcified aortic atherosclerosis. There is evidence of calcified coronary artery atherosclerosis on series 5,  image 42. Mild, reactive appearing mediastinal nodes. No axillary lymphadenopathy. Negative thoracic inlet aside from thyromegaly with 10 mm left thyroid lobe nodule which does not meet consensus criteria for ultrasound follow-up. Large simple fluid density probable sebaceous cyst along the inferior right chest wall (series 5, image 64). Smaller left lower chest wall lesion on image 60. Negative visualized liver, spleen, adrenal glands, renal upper poles, and bowel in the upper abdomen. No acute osseous abnormality identified. Review of the MIP images confirms the above findings. IMPRESSION: 1.  No evidence of acute pulmonary embolus. 2. Constellation of lung findings most compatible with  pulmonary edema and moderate bilateral layering pleural effusions. Associated confluent lower lobe opacity favored to be atelectasis. 3. No pericardial effusion. Calcified coronary artery and to a lesser extent aortic atherosclerosis. Electronically Signed   By: Genevie Ann M.D.   On: 11/15/2015 09:40     Labs: Basic Metabolic Panel:  Recent Labs Lab 11/14/15 0750 11/14/15 1738 11/15/15 0533 11/16/15 0703 11/17/15 0613  NA 140  --  143 139 139  K 3.3*  --  3.3* 3.5 3.7  CL 105  --  105 102 103  CO2 25  --  29 29 29   GLUCOSE 337*  --  83 173* 192*  BUN 15  --  17 18 19   CREATININE 0.66 0.80 0.88 0.87 0.72  CALCIUM 8.3*  --  8.2* 7.9* 8.2*   CBC:  Recent Labs Lab 11/14/15 0750 11/14/15 1738  WBC 8.1 9.1  HGB 11.5* 11.5*  HCT 35.0* 34.8*  MCV 75.1* 74.8*  PLT 386 371   Cardiac Enzymes:  Recent Labs Lab 11/14/15 0750 11/14/15 1738 11/14/15 2309 11/15/15 0533  TROPONINI 0.05* 0.09* 0.11* 0.07*   BNP: BNP (last 3 results)  Recent Labs  11/14/15 0750  BNP 217.0*   CBG:  Recent Labs Lab 11/16/15 1214 11/16/15 1658 11/16/15 2022 11/17/15 0803 11/17/15 1126  GLUCAP 273* 219* 150* 184* 177*     Signed: Jehanzeb Memon. MD [facility] 11/17/2015, 11:48 AM    By signing my name below, I, Rosalie Doctor, attest that this documentation has been prepared under the direction and in the presence of [facility]. MD Electronically Signed: Rosalie Doctor, Scribe. 11/17/2015  I, Dr. Kathie Dike, personally performed the services described in this documentaiton. All medical record entries made by the scribe were at my direction and in my presence. I have reviewed the chart and agree that the record reflects my personal performance and is accurate and complete  Kathie Dike, MD, 11/17/2015 11:48 AM

## 2015-11-17 NOTE — Progress Notes (Signed)
Patient states understanding of discharge instructions, prescriptions given 

## 2015-12-05 ENCOUNTER — Emergency Department (HOSPITAL_COMMUNITY)
Admission: EM | Admit: 2015-12-05 | Discharge: 2015-12-05 | Disposition: A | Discharge status: Home / Self Care | Payer: Self-pay | Attending: Emergency Medicine | Admitting: Emergency Medicine

## 2015-12-05 ENCOUNTER — Emergency Department (HOSPITAL_COMMUNITY): Payer: Self-pay

## 2015-12-05 ENCOUNTER — Encounter (HOSPITAL_COMMUNITY): Payer: Self-pay | Admitting: Emergency Medicine

## 2015-12-05 DIAGNOSIS — Z794 Long term (current) use of insulin: Secondary | ICD-10-CM | POA: Insufficient documentation

## 2015-12-05 DIAGNOSIS — R05 Cough: Secondary | ICD-10-CM | POA: Insufficient documentation

## 2015-12-05 DIAGNOSIS — T464X5A Adverse effect of angiotensin-converting-enzyme inhibitors, initial encounter: Secondary | ICD-10-CM

## 2015-12-05 DIAGNOSIS — Z79899 Other long term (current) drug therapy: Secondary | ICD-10-CM | POA: Insufficient documentation

## 2015-12-05 DIAGNOSIS — I10 Essential (primary) hypertension: Secondary | ICD-10-CM | POA: Insufficient documentation

## 2015-12-05 DIAGNOSIS — E876 Hypokalemia: Secondary | ICD-10-CM | POA: Insufficient documentation

## 2015-12-05 DIAGNOSIS — E785 Hyperlipidemia, unspecified: Secondary | ICD-10-CM | POA: Insufficient documentation

## 2015-12-05 DIAGNOSIS — R058 Other specified cough: Secondary | ICD-10-CM

## 2015-12-05 DIAGNOSIS — Z7984 Long term (current) use of oral hypoglycemic drugs: Secondary | ICD-10-CM | POA: Insufficient documentation

## 2015-12-05 DIAGNOSIS — E119 Type 2 diabetes mellitus without complications: Secondary | ICD-10-CM | POA: Insufficient documentation

## 2015-12-05 LAB — URINALYSIS, ROUTINE W REFLEX MICROSCOPIC
Bilirubin Urine: NEGATIVE
Glucose, UA: 100 mg/dL — AB
Ketones, ur: NEGATIVE mg/dL
NITRITE: NEGATIVE
PROTEIN: 100 mg/dL — AB
Specific Gravity, Urine: 1.03 (ref 1.005–1.030)
pH: 5.5 (ref 5.0–8.0)

## 2015-12-05 LAB — URINE MICROSCOPIC-ADD ON

## 2015-12-05 LAB — BASIC METABOLIC PANEL
Anion gap: 10 (ref 5–15)
BUN: 17 mg/dL (ref 6–20)
CHLORIDE: 101 mmol/L (ref 101–111)
CO2: 29 mmol/L (ref 22–32)
CREATININE: 1.04 mg/dL — AB (ref 0.44–1.00)
Calcium: 8.8 mg/dL — ABNORMAL LOW (ref 8.9–10.3)
GFR, EST NON AFRICAN AMERICAN: 58 mL/min — AB (ref >60)
Glucose, Bld: 170 mg/dL — ABNORMAL HIGH (ref 65–99)
Potassium: 3.1 mmol/L — ABNORMAL LOW (ref 3.5–5.1)
SODIUM: 140 mmol/L (ref 135–145)

## 2015-12-05 LAB — CBC
HCT: 32.1 % — ABNORMAL LOW (ref 36.0–46.0)
Hemoglobin: 10.7 g/dL — ABNORMAL LOW (ref 12.0–15.0)
MCH: 24.6 pg — ABNORMAL LOW (ref 26.0–34.0)
MCHC: 33.3 g/dL (ref 30.0–36.0)
MCV: 73.8 fL — AB (ref 78.0–100.0)
PLATELETS: 345 10*3/uL (ref 150–400)
RBC: 4.35 MIL/uL (ref 3.87–5.11)
RDW: 13.3 % (ref 11.5–15.5)
WBC: 8.1 10*3/uL (ref 4.0–10.5)

## 2015-12-05 LAB — CBG MONITORING, ED: GLUCOSE-CAPILLARY: 147 mg/dL — AB (ref 65–99)

## 2015-12-05 MED ORDER — CARVEDILOL 12.5 MG PO TABS
25.0000 mg | ORAL_TABLET | Freq: Two times a day (BID) | ORAL | Status: DC
  Administered 2015-12-05: 25 mg via ORAL
  Filled 2015-12-05: qty 2

## 2015-12-05 MED ORDER — BENZONATATE 100 MG PO CAPS: 100.0000 mg | ORAL_CAPSULE | Freq: Three times a day (TID) | ORAL | Status: DC | PRN

## 2015-12-05 MED ORDER — VALSARTAN 160 MG PO TABS: 160.0000 mg | ORAL_TABLET | Freq: Every day | ORAL | Status: DC

## 2015-12-05 MED ORDER — POTASSIUM CHLORIDE ER 10 MEQ PO TBCR: 10.0000 meq | EXTENDED_RELEASE_TABLET | Freq: Two times a day (BID) | ORAL | Status: DC

## 2015-12-05 NOTE — ED Provider Notes (Signed)
CSN: [id number]     Arrival date & time 12/05/15  1745 History   First MD Initiated Contact with Patient 12/05/15 1806     Chief Complaint  Patient presents with  . Dizziness      HPI  Patient presents for evaluation of office, and an episode of dizziness and almost passing out.  Patient recently admitted with new onset congestive heart failure secondary to hypertensive urgency.  Occasions were adjusted.  She's been doing well at home.  Perhaps slightly increased swelling in her legs today versus yesterday.  She was at work today.  She talked with a coworker began laughing hard.  She states she was ultimately laughing" and that over that she started feeling lightheaded and like things were fuzzy or dizzy".  States she thought she was going to pass out.  This resolved within about 2-3 minutes she feels well now.  States she's had a significant cough over the last 6 or 7 days.  Of note she was placed on lisinopril at discharge proximal to 14 days ago.  Past Medical History  Diagnosis Date  . Diabetes mellitus   . Hypertension   . Hyperlipidemia   . Stroke ([place]) 09/2014    numbness left upper lip, finger tips on left hand   Past Surgical History  Procedure Laterality Date  . Tubal ligation    . Partial hysterectomy  1999    fibroids   Family History  Problem Relation Age of Onset  . Diabetes Mother   . Hypertension Sister   . Hypertension Brother    Social History  Substance Use Topics  . Smoking status: Never Smoker   . Smokeless tobacco: Never Used  . Alcohol Use: No   OB History    No data available     Review of Systems  Constitutional: Negative for fever, chills, diaphoresis, appetite change and fatigue.  HENT: Negative for mouth sores, sore throat and trouble swallowing.   Eyes: Negative for visual disturbance.  Respiratory: Positive for cough and shortness of breath. Negative for chest tightness and wheezing.   Cardiovascular: Negative for chest pain.   Gastrointestinal: Negative for nausea, vomiting, abdominal pain, diarrhea and abdominal distention.  Endocrine: Negative for polydipsia, polyphagia and polyuria.  Genitourinary: Negative for dysuria, frequency and hematuria.  Musculoskeletal: Negative for gait problem.  Skin: Negative for color change, pallor and rash.  Neurological: Positive for dizziness. Negative for syncope, light-headedness and headaches.  Hematological: Does not bruise/bleed easily.  Psychiatric/Behavioral: Negative for behavioral problems and confusion.      Allergies  Review of patient's allergies indicates no known allergies.  Home Medications   Prior to Admission medications   Medication Sig Start Date End Date Taking? Authorizing Provider  aspirin EC 81 MG tablet Take 1 tablet (81 mg total) by mouth daily. 10/02/14   Belkys A Regalado, MD  benzonatate (TESSALON PERLES) 100 MG capsule Take 1 capsule (100 mg total) by mouth 3 (three) times daily as needed for cough. 12/05/15   Tanna Furry, MD  carvedilol (COREG) 6.25 MG tablet Take 1 tablet (6.25 mg total) by mouth 2 (two) times daily with a meal. 11/17/15   Kathie Dike, MD  furosemide (LASIX) 40 MG tablet Take 1 tablet (40 mg total) by mouth daily. 11/17/15   Kathie Dike, MD  Glucose Blood (BLOOD GLUCOSE TEST STRIPS) STRP Use as directed 11/17/15   Kathie Dike, MD  Insulin Isophane & Regular Human (HUMULIN 70/30 PEN) (70-30) 100 UNIT/ML PEN Inject 13 Units into  the skin 2 (two) times daily. 11/17/15   Kathie Dike, MD  Insulin Pen Needle 29G X 10MM MISC Use as directed 11/17/15   Kathie Dike, MD  lisinopril (PRINIVIL,ZESTRIL) 20 MG tablet Take 1 tablet (20 mg total) by mouth daily. 11/17/15   Kathie Dike, MD  metFORMIN (GLUCOPHAGE) 1000 MG tablet Take 1,000 mg by mouth 2 (two) times daily with a meal.    Historical Provider, MD  potassium chloride (K-DUR) 10 MEQ tablet Take 1 tablet (10 mEq total) by mouth 2 (two) times daily. 12/05/15   Tanna Furry, MD   rosuvastatin (CRESTOR) 10 MG tablet Take 10 mg by mouth daily.    Historical Provider, MD  valsartan (DIOVAN) 160 MG tablet Take 1 tablet (160 mg total) by mouth daily. 12/05/15   Tanna Furry, MD   BP 199/87 mmHg  Pulse 81  Temp(Src) 98.1 F (36.7 C) (Oral)  Resp 20  Ht 5\' 7"  (1.702 m)  Wt 199 lb (90.266 kg)  BMI 31.16 kg/m2  SpO2 100% Physical Exam  Constitutional: She is oriented to person, place, and time. She appears well-developed and well-nourished. No distress.  HENT:  Head: Normocephalic.  Eyes: Conjunctivae are normal. Pupils are equal, round, and reactive to light. No scleral icterus.  Neck: Normal range of motion. Neck supple. No thyromegaly present.  Cardiovascular: Normal rate and regular rhythm.  Exam reveals no gallop and no friction rub.   No murmur heard. Pulmonary/Chest: Effort normal and breath sounds normal. No respiratory distress. She has no wheezes. She has no rales.  Clear lungs.  Normal sinus rhythm.  No S3 or 4 gallop.  No JVD.  Trace symmetric bilateral turbinate edema.  Abdominal: Soft. Bowel sounds are normal. She exhibits no distension. There is no tenderness. There is no rebound.  Musculoskeletal: Normal range of motion.  Neurological: She is alert and oriented to person, place, and time.  Skin: Skin is warm and dry. No rash noted.  Psychiatric: She has a normal mood and affect. Her behavior is normal.    ED Course  Procedures (including critical care time) Labs Review Labs Reviewed  BASIC METABOLIC PANEL - Abnormal; Notable for the following:    Potassium 3.1 (*)    Glucose, Bld 170 (*)    Creatinine, Ser 1.04 (*)    Calcium 8.8 (*)    GFR calc non Af Amer 58 (*)    All other components within normal limits  CBC - Abnormal; Notable for the following:    Hemoglobin 10.7 (*)    HCT 32.1 (*)    MCV 73.8 (*)    MCH 24.6 (*)    All other components within normal limits  URINALYSIS, ROUTINE W REFLEX MICROSCOPIC (NOT AT [facility]) - Abnormal; Notable  for the following:    Glucose, UA 100 (*)    Hgb urine dipstick MODERATE (*)    Protein, ur 100 (*)    Leukocytes, UA TRACE (*)    All other components within normal limits  URINE MICROSCOPIC-ADD ON - Abnormal; Notable for the following:    Squamous Epithelial / LPF 0-5 (*)    Bacteria, UA MANY (*)    Casts HYALINE CASTS (*)    All other components within normal limits  CBG MONITORING, ED - Abnormal; Notable for the following:    Glucose-Capillary 147 (*)    All other components within normal limits    Imaging Review Dg Chest 2 View  12/05/2015  CLINICAL DATA:  Pt c/o dry cough and  SOB x 1 wk. Cough worsens at night. Hx diabetes, HTN, stroke, nonsmoker EXAM: CHEST  2 VIEW COMPARISON:  Chest CT, 11/15/2015.  Chest radiograph, 11/14/2015. FINDINGS: Cardiac silhouette borderline enlarged. No mediastinal or hilar masses or evidence of adenopathy. There is mild interstitial thickening which has significantly improved the prior chest radiographs. No significant residual pleural effusion. No evidence of pneumonia. No pneumothorax. Bony thorax is intact. IMPRESSION: Significantly improved congestive heart failure since prior chest radiographs. No evidence of pneumonia. Electronically Signed   By: Lajean Manes M.D.   On: 12/05/2015 19:10   I have personally reviewed and evaluated these images and lab results as part of my medical decision-making.   EKG Interpretation   Date/Time:  Thursday December 05 2015 18:04:26 EDT Ventricular Rate:  87 PR Interval:  157 QRS Duration: 102 QT Interval:  408 QTC Calculation: 491 R Axis:   72 Text Interpretation:  Sinus rhythm Probable left atrial enlargement  Abnormal T, lateral leads--Noted on EKG 10/2012 J point elevation, anterior  leads, noted since 10/2012 Confirmed by Jeneen Rinks  MD, [place] ([id number]) on  12/05/2015 6:21:51 PM      MDM   Final diagnoses:  Essential hypertension  ACE-inhibitor cough  Hypokalemia    I think this is likely episode of  "last syncope", or lightheadedness secondary to her Valsalva from her last.  Cough is very likely an Ace cough.  Will change to valsartan, stop lisinopril.  She looks much improved.  Trace edema.  Mild hypokalemia.  Let potassium, continue Lasix.  Stop lisinopril in favor valsartan.  Primary care follow-up.  Tessalon for cough as needed.    Tanna Furry, MD 12/05/15 1945

## 2015-12-05 NOTE — ED Notes (Signed)
Pt states she was laughing hard while at work and became dizzy and shaky, lasting for a minute or so and has since resolved.  Pt states she feels better.

## 2015-12-05 NOTE — Discharge Instructions (Signed)
Stop lisinopril, it may be causing your cough. Start new prescriptions valsartan, potassium, and Tessalon. Continue Lasix at current dose. You may double your Lasix dose on days you gain 2 or more pounds, or notice increased swelling in your legs.   Cough, Adult A cough helps to clear your throat and lungs. A cough may last only 2-3 weeks (acute), or it may last longer than 8 weeks (chronic). Many different things can cause a cough. A cough may be a sign of an illness or another medical condition. HOME CARE  Pay attention to any changes in your cough.  Take medicines only as told by your doctor.  If you were prescribed an antibiotic medicine, take it as told by your doctor. Do not stop taking it even if you start to feel better.  Talk with your doctor before you try using a cough medicine.  Drink enough fluid to keep your pee (urine) clear or pale yellow.  If the air is dry, use a cold steam vaporizer or humidifier in your home.  Stay away from things that make you cough at work or at home.  If your cough is worse at night, try using extra pillows to raise your head up higher while you sleep.  Do not smoke, and try not to be around smoke. If you need help quitting, ask your doctor.  Do not have caffeine.  Do not drink alcohol.  Rest as needed. GET HELP IF:  You have new problems (symptoms).  You cough up yellow fluid (pus).  Your cough does not get better after 2-3 weeks, or your cough gets worse.  Medicine does not help your cough and you are not sleeping well.  You have pain that gets worse or pain that is not helped with medicine.  You have a fever.  You are losing weight and you do not know why.  You have night sweats. GET HELP RIGHT AWAY IF:  You cough up blood.  You have trouble breathing.  Your heartbeat is very fast.   This information is not intended to replace advice given to you by your health care provider. Make sure you discuss any questions you  have with your health care provider.   Document Released: 04/09/2011 Document Revised: 04/17/2015 Document Reviewed: 10/03/2014 Elsevier Interactive Patient Education 2016 [employer].  Hypertension Hypertension is another name for high blood pressure. High blood pressure forces your heart to work harder to pump blood. A blood pressure reading has two numbers, which includes a higher number over a lower number (example: 110/72). HOME CARE   Have your blood pressure rechecked by your doctor.  Only take medicine as told by your doctor. Follow the directions carefully. The medicine does not work as well if you skip doses. Skipping doses also puts you at risk for problems.  Do not smoke.  Monitor your blood pressure at home as told by your doctor. GET HELP IF:  You think you are having a reaction to the medicine you are taking.  You have repeat headaches or feel dizzy.  You have puffiness (swelling) in your ankles.  You have trouble with your vision. GET HELP RIGHT AWAY IF:   You get a very bad headache and are confused.  You feel weak, numb, or faint.  You get chest or belly (abdominal) pain.  You throw up (vomit).  You cannot breathe very well. MAKE SURE YOU:   Understand these instructions.  Will watch your condition.  Will get help right away  if you are not doing well or get worse.   This information is not intended to replace advice given to you by your health care provider. Make sure you discuss any questions you have with your health care provider.   Document Released: 01/13/2008 Document Revised: 08/01/2013 Document Reviewed: 05/19/2013 Elsevier Interactive Patient Education 2016 [employer].  Hypokalemia Hypokalemia means that the amount of potassium in the blood is lower than normal.Potassium is a chemical, called an electrolyte, that helps regulate the amount of fluid in the body. It also stimulates muscle contraction and helps nerves function  properly.Most of the body's potassium is inside of cells, and only a very small amount is in the blood. Because the amount in the blood is so small, minor changes can be life-threatening. CAUSES  Antibiotics.  Diarrhea or vomiting.  Using laxatives too much, which can cause diarrhea.  Chronic kidney disease.  Water pills (diuretics).  Eating disorders (bulimia).  Low magnesium level.  Sweating a lot. SIGNS AND SYMPTOMS  Weakness.  Constipation.  Fatigue.  Muscle cramps.  Mental confusion.  Skipped heartbeats or irregular heartbeat (palpitations).  Tingling or numbness. DIAGNOSIS  Your health care provider can diagnose hypokalemia with blood tests. In addition to checking your potassium level, your health care provider may also check other lab tests. TREATMENT Hypokalemia can be treated with potassium supplements taken by mouth or adjustments in your current medicines. If your potassium level is very low, you may need to get potassium through a vein (IV) and be monitored in the hospital. A diet high in potassium is also helpful. Foods high in potassium are:  Nuts, such as peanuts and pistachios.  Seeds, such as sunflower seeds and pumpkin seeds.  Peas, lentils, and lima beans.  Whole grain and bran cereals and breads.  Fresh fruit and vegetables, such as apricots, avocado, bananas, cantaloupe, kiwi, oranges, tomatoes, asparagus, and potatoes.  Orange and tomato juices.  Red meats.  Fruit yogurt. HOME CARE INSTRUCTIONS  Take all medicines as prescribed by your health care provider.  Maintain a healthy diet by including nutritious food, such as fruits, vegetables, nuts, whole grains, and lean meats.  If you are taking a laxative, be sure to follow the directions on the label. SEEK MEDICAL CARE IF:  Your weakness gets worse.  You feel your heart pounding or racing.  You are vomiting or having diarrhea.  You are diabetic and having trouble keeping your  blood glucose in the normal range. SEEK IMMEDIATE MEDICAL CARE IF:  You have chest pain, shortness of breath, or dizziness.  You are vomiting or having diarrhea for more than 2 days.  You faint. MAKE SURE YOU:   Understand these instructions.  Will watch your condition.  Will get help right away if you are not doing well or get worse.   This information is not intended to replace advice given to you by your health care provider. Make sure you discuss any questions you have with your health care provider.   Document Released: 07/27/2005 Document Revised: 08/17/2014 Document Reviewed: 01/27/2013 Elsevier Interactive Patient Education [employer].

## 2015-12-05 NOTE — ED Notes (Signed)
Dr Jeneen Rinks aware of most recent BP reading.

## 2015-12-05 NOTE — ED Notes (Signed)
Denies dizziness at present

## 2015-12-05 NOTE — ED Notes (Signed)
MD at bedside. 

## 2015-12-05 NOTE — ED Notes (Signed)
Phlebotomy at bedside.

## 2015-12-11 ENCOUNTER — Ambulatory Visit (INDEPENDENT_AMBULATORY_CARE_PROVIDER_SITE_OTHER): Payer: Self-pay | Admitting: Cardiovascular Disease

## 2015-12-11 ENCOUNTER — Encounter: Payer: Self-pay | Admitting: Cardiovascular Disease

## 2015-12-11 VITALS — BP 152/86 | HR 94 | Ht 67.0 in | Wt 195.0 lb

## 2015-12-11 DIAGNOSIS — I248 Other forms of acute ischemic heart disease: Secondary | ICD-10-CM

## 2015-12-11 DIAGNOSIS — Z9289 Personal history of other medical treatment: Secondary | ICD-10-CM

## 2015-12-11 DIAGNOSIS — I10 Essential (primary) hypertension: Secondary | ICD-10-CM

## 2015-12-11 DIAGNOSIS — R0989 Other specified symptoms and signs involving the circulatory and respiratory systems: Secondary | ICD-10-CM

## 2015-12-11 DIAGNOSIS — I2489 Other forms of acute ischemic heart disease: Secondary | ICD-10-CM

## 2015-12-11 DIAGNOSIS — I429 Cardiomyopathy, unspecified: Secondary | ICD-10-CM

## 2015-12-11 DIAGNOSIS — Z87898 Personal history of other specified conditions: Secondary | ICD-10-CM

## 2015-12-11 DIAGNOSIS — I5042 Chronic combined systolic (congestive) and diastolic (congestive) heart failure: Secondary | ICD-10-CM

## 2015-12-11 MED ORDER — CARVEDILOL 12.5 MG PO TABS: 12.5000 mg | ORAL_TABLET | Freq: Two times a day (BID) | ORAL | Status: DC

## 2015-12-11 NOTE — Patient Instructions (Signed)
Your physician recommends that you schedule a follow-up appointment in: 1 month   INCREASE Coreg to 12.5 mg twice a day   Your physician has requested that you have a carotid duplex. This test is an ultrasound of the carotid arteries in your neck. It looks at blood flow through these arteries that supply the brain with blood. Allow one hour for this exam. There are no restrictions or special instructions.   Your physician has requested that you have en exercise stress myoview. For further information please visit [email or web address]. Please follow instruction sheet, as given.    Thank you for choosing [facility] !

## 2015-12-11 NOTE — Progress Notes (Signed)
Patient ID: Leza Ashdown, female   DOB: Oct 01, 1955, 60 y.o.   MRN: [id number]       CARDIOLOGY CONSULT NOTE  Patient ID: Chayne Zaniewski MRN: [id number] DOB/AGE: Mar 15, 1956 60 y.o.  Admit date: (Not on file) Primary Physician: No PCP Per Patient Referring Physician: Memon MD  Reason for Consultation: CHF, HTN  HPI: The patient is a 60 year old woman was hospitalized in April for uncontrolled hypertension and acute on chronic combined systolic and diastolic heart failure. She also has a history of a CVA, hypertension, and uncontrolled diabetes. Echocardiogram demonstrated EF 45% with grade 2 diastolic dysfunction and global hypokinesis. She had elevated troponins suggestive of demand ischemia in the setting of severe hypertension and congestive heart failure.  She was evaluated for dizziness in the ED on 4/27 and blood pressure was 199/87. Lisinopril was switched to valsartan.  ECG 12/05/15 which I personally interpreted demonstrated sinus rhythm with nonspecific ST segment and T-wave abnormalities.  She currently denies chest pain and said she has never experienced chest pain or tightness. She lives on the second floor and says she feels tired by the time she climbs stairs. She said she has felt more fatigued over the past month but energy levels have improved since being hospitalized. She is occasionally short of breath but denies orthopnea and paroxysmal nocturnal dyspnea since being hospitalized. She went to the health department and they did not provide her with Diovan.  Soc: Works at [employer] in [place].   No Known Allergies  Current Outpatient Prescriptions  Medication Sig Dispense Refill  . aspirin EC 81 MG tablet Take 1 tablet (81 mg total) by mouth daily. 30 tablet 0  . benzonatate (TESSALON PERLES) 100 MG capsule Take 1 capsule (100 mg total) by mouth 3 (three) times daily as needed for cough. 20 capsule 0  . carvedilol (COREG) 6.25 MG tablet Take 1 tablet (6.25 mg  total) by mouth 2 (two) times daily with a meal. 60 tablet 1  . furosemide (LASIX) 40 MG tablet Take 1 tablet (40 mg total) by mouth daily. 30 tablet 1  . Glucose Blood (BLOOD GLUCOSE TEST STRIPS) STRP Use as directed 100 each 0  . Insulin Pen Needle 29G X 10MM MISC Use as directed 100 each 0  . potassium chloride (K-DUR) 10 MEQ tablet Take 1 tablet (10 mEq total) by mouth 2 (two) times daily. 30 tablet 0  . sitaGLIPtin-metformin (JANUMET) 50-1000 MG tablet Take 2 tablets by mouth daily.     No current facility-administered medications for this visit.    Past Medical History  Diagnosis Date  . Diabetes mellitus   . Hypertension   . Hyperlipidemia   . Stroke ([place]) 09/2014    numbness left upper lip, finger tips on left hand    Past Surgical History  Procedure Laterality Date  . Tubal ligation    . Partial hysterectomy  1999    fibroids    Social History   Social History  . Marital Status: Single    Spouse Name: N/A  . Number of Children: N/A  . Years of Education: 16   Occupational History  . hairdresser    Social History Main Topics  . Smoking status: Never Smoker   . Smokeless tobacco: Never Used  . Alcohol Use: No  . Drug Use: No  . Sexual Activity: Not Currently   Other Topics Concern  . Not on file   Social History Narrative   Married, 2 children, hairdresser   Caffeine use- occasional  tea or coffee   Right handed        No family history of premature CAD in 1st degree relatives.  Prior to Admission medications   Medication Sig Start Date End Date Taking? Authorizing Provider  aspirin EC 81 MG tablet Take 1 tablet (81 mg total) by mouth daily. 10/02/14   Belkys A Regalado, MD  benzonatate (TESSALON PERLES) 100 MG capsule Take 1 capsule (100 mg total) by mouth 3 (three) times daily as needed for cough. 12/05/15   Tanna Furry, MD  carvedilol (COREG) 6.25 MG tablet Take 1 tablet (6.25 mg total) by mouth 2 (two) times daily with a meal. 11/17/15   Kathie Dike, MD  furosemide (LASIX) 40 MG tablet Take 1 tablet (40 mg total) by mouth daily. 11/17/15   Kathie Dike, MD  Glucose Blood (BLOOD GLUCOSE TEST STRIPS) STRP Use as directed 11/17/15   Kathie Dike, MD  Insulin Isophane & Regular Human (HUMULIN 70/30 PEN) (70-30) 100 UNIT/ML PEN Inject 13 Units into the skin 2 (two) times daily. 11/17/15   Kathie Dike, MD  Insulin Pen Needle 29G X 10MM MISC Use as directed 11/17/15   Kathie Dike, MD  lisinopril (PRINIVIL,ZESTRIL) 20 MG tablet Take 1 tablet (20 mg total) by mouth daily. 11/17/15   Kathie Dike, MD  metFORMIN (GLUCOPHAGE) 1000 MG tablet Take 1,000 mg by mouth 2 (two) times daily with a meal.    Historical Provider, MD  potassium chloride (K-DUR) 10 MEQ tablet Take 1 tablet (10 mEq total) by mouth 2 (two) times daily. 12/05/15   Tanna Furry, MD  rosuvastatin (CRESTOR) 10 MG tablet Take 10 mg by mouth daily.    Historical Provider, MD  valsartan (DIOVAN) 160 MG tablet Take 1 tablet (160 mg total) by mouth daily. 12/05/15   Tanna Furry, MD     Review of systems complete and found to be negative unless listed above in HPI     Physical exam Blood pressure 152/86, pulse 94, height 5\' 7"  (1.702 m), weight 195 lb (88.451 kg), SpO2 96 %. General: NAD Neck: No JVD, no thyromegaly or thyroid nodule.  Lungs: Clear to auscultation bilaterally with normal respiratory effort. CV: Nondisplaced PMI. Regular rate and rhythm, normal S1/S2, no S3/S4, no murmur.  Trace pretibial edema b/l.  Left carotid bruit.   Abdomen: Soft, nontender, no distention.  Skin: Intact without lesions or rashes.  Neurologic: Alert and oriented x 3.  Psych: Normal affect. Extremities: No clubbing or cyanosis.  HEENT: Normal.   ECG: Most recent ECG reviewed.  Labs:   Lab Results  Component Value Date   WBC 8.1 12/05/2015   HGB 10.7* 12/05/2015   HCT 32.1* 12/05/2015   MCV 73.8* 12/05/2015   PLT 345 12/05/2015     Recent Labs Lab 12/05/15 1811  NA 140  K 3.1*    CL 101  CO2 29  BUN 17  CREATININE 1.04*  CALCIUM 8.8*  GLUCOSE 170*   Lab Results  Component Value Date   TROPONINI 0.07* 11/15/2015    Lab Results  Component Value Date   CHOL 243* 10/02/2014   Lab Results  Component Value Date   HDL 101 10/02/2014   Lab Results  Component Value Date   LDLCALC 131* 10/02/2014   Lab Results  Component Value Date   TRIG 53 10/02/2014   Lab Results  Component Value Date   CHOLHDL 2.4 10/02/2014   No results found for: LDLDIRECT       Studies: No  results found.  ASSESSMENT AND PLAN:  1. 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 I will aim to control BP.  I will proceed with a nuclear myocardial perfusion imaging study (exercise Cardiolite) to evaluate for ischemic heart disease.  2. Malignant HTN: Elevated. Will increase Coreg to 12.5 mg BID. Health dept did not provide valsartan.  3. Demand ischemia: I will proceed with a nuclear myocardial perfusion imaging study to evaluate for ischemic heart disease.  4. Left carotid bruit: Will obtain Dopplers.  Dispo: fu 1 month.   Signed: Kate Sable, M.D., F.A.C.C.  12/11/2015, 2:40 PM

## 2015-12-16 ENCOUNTER — Encounter (HOSPITAL_COMMUNITY)
Admission: RE | Admit: 2015-12-16 | Discharge: 2015-12-16 | Disposition: A | Discharge status: Home / Self Care | Payer: Self-pay | Source: Ambulatory Visit | Attending: Cardiovascular Disease | Admitting: Cardiovascular Disease

## 2015-12-16 ENCOUNTER — Encounter (HOSPITAL_COMMUNITY): Payer: Self-pay

## 2015-12-16 ENCOUNTER — Inpatient Hospital Stay (HOSPITAL_COMMUNITY): Admission: RE | Admit: 2015-12-16 | Payer: Self-pay | Source: Ambulatory Visit

## 2015-12-16 ENCOUNTER — Ambulatory Visit (HOSPITAL_COMMUNITY)
Admission: RE | Admit: 2015-12-16 | Discharge: 2015-12-16 | Disposition: A | Discharge status: Home / Self Care | Payer: Self-pay | Source: Ambulatory Visit | Attending: Cardiovascular Disease | Admitting: Cardiovascular Disease

## 2015-12-16 DIAGNOSIS — I429 Cardiomyopathy, unspecified: Secondary | ICD-10-CM | POA: Insufficient documentation

## 2015-12-16 DIAGNOSIS — R0989 Other specified symptoms and signs involving the circulatory and respiratory systems: Secondary | ICD-10-CM | POA: Insufficient documentation

## 2015-12-16 DIAGNOSIS — R079 Chest pain, unspecified: Secondary | ICD-10-CM | POA: Insufficient documentation

## 2015-12-16 DIAGNOSIS — I6522 Occlusion and stenosis of left carotid artery: Secondary | ICD-10-CM | POA: Insufficient documentation

## 2015-12-16 DIAGNOSIS — I248 Other forms of acute ischemic heart disease: Secondary | ICD-10-CM | POA: Insufficient documentation

## 2015-12-16 LAB — NM MYOCAR MULTI W/SPECT W/WALL MOTION / EF
CHL CUP MPHR: 161 {beats}/min
CHL CUP NUCLEAR SDS: 5
CHL CUP NUCLEAR SRS: 4
CHL CUP NUCLEAR SSS: 9
CSEPEW: 4.6 METS
CSEPHR: 89 %
Exercise duration (min): 3 min
Exercise duration (sec): 2 s
LHR: 1.07
LV sys vol: 61 mL
LVDIAVOL: 115 mL (ref 46–106)
Peak HR: 144 {beats}/min
RPE: 13
Rest HR: 78 {beats}/min
TID: 1.07

## 2015-12-16 MED ORDER — REGADENOSON 0.4 MG/5ML IV SOLN
INTRAVENOUS | Status: AC
  Filled 2015-12-16: qty 5

## 2015-12-16 MED ORDER — TECHNETIUM TC 99M SESTAMIBI GENERIC - CARDIOLITE
10.0000 | Freq: Once | INTRAVENOUS | Status: AC | PRN
  Administered 2015-12-16: 10 via INTRAVENOUS

## 2015-12-16 MED ORDER — SODIUM CHLORIDE 0.9% FLUSH
INTRAVENOUS | Status: AC
  Administered 2015-12-16: 10 mL via INTRAVENOUS
  Filled 2015-12-16: qty 10

## 2015-12-16 MED ORDER — TECHNETIUM TC 99M SESTAMIBI - CARDIOLITE
30.0000 | Freq: Once | INTRAVENOUS | Status: AC | PRN
  Administered 2015-12-16: 11:00:00 30 via INTRAVENOUS

## 2015-12-17 ENCOUNTER — Telehealth: Payer: Self-pay

## 2015-12-17 NOTE — Telephone Encounter (Signed)
lmtcb 5/9.

## 2015-12-17 NOTE — Telephone Encounter (Signed)
-----   Message from Herminio Commons, MD sent at 12/16/2015  2:01 PM EDT ----- Moderate blockage on left. Repeat in one year.

## 2016-01-16 ENCOUNTER — Ambulatory Visit: Payer: Self-pay | Admitting: Cardiovascular Disease

## 2016-02-14 ENCOUNTER — Ambulatory Visit (INDEPENDENT_AMBULATORY_CARE_PROVIDER_SITE_OTHER): Payer: Self-pay | Admitting: Cardiovascular Disease

## 2016-02-14 ENCOUNTER — Encounter: Payer: Self-pay | Admitting: Cardiovascular Disease

## 2016-02-14 ENCOUNTER — Telehealth: Payer: Self-pay | Admitting: Cardiovascular Disease

## 2016-02-14 VITALS — BP 158/90 | HR 97 | Ht 65.0 in | Wt 198.0 lb

## 2016-02-14 DIAGNOSIS — I248 Other forms of acute ischemic heart disease: Secondary | ICD-10-CM

## 2016-02-14 DIAGNOSIS — I5043 Acute on chronic combined systolic (congestive) and diastolic (congestive) heart failure: Secondary | ICD-10-CM

## 2016-02-14 DIAGNOSIS — R600 Localized edema: Secondary | ICD-10-CM

## 2016-02-14 DIAGNOSIS — I10 Essential (primary) hypertension: Secondary | ICD-10-CM

## 2016-02-14 DIAGNOSIS — I739 Peripheral vascular disease, unspecified: Secondary | ICD-10-CM

## 2016-02-14 DIAGNOSIS — I429 Cardiomyopathy, unspecified: Secondary | ICD-10-CM

## 2016-02-14 DIAGNOSIS — I2489 Other forms of acute ischemic heart disease: Secondary | ICD-10-CM

## 2016-02-14 DIAGNOSIS — I779 Disorder of arteries and arterioles, unspecified: Secondary | ICD-10-CM

## 2016-02-14 MED ORDER — POTASSIUM CHLORIDE CRYS ER 20 MEQ PO TBCR: 20.0000 meq | EXTENDED_RELEASE_TABLET | Freq: Two times a day (BID) | ORAL | Status: DC

## 2016-02-14 MED ORDER — VALSARTAN 80 MG PO TABS: 80.0000 mg | ORAL_TABLET | Freq: Every day | ORAL | Status: DC

## 2016-02-14 MED ORDER — LOSARTAN POTASSIUM 25 MG PO TABS: 25.0000 mg | ORAL_TABLET | Freq: Every day | ORAL | Status: DC

## 2016-02-14 MED ORDER — FUROSEMIDE 40 MG PO TABS: 40.0000 mg | ORAL_TABLET | Freq: Two times a day (BID) | ORAL | Status: DC

## 2016-02-14 NOTE — Telephone Encounter (Signed)
Yes, we did start Valsartan- that is too expensive for the pt. I will call in losartan 25 mg to her pharmacy

## 2016-02-14 NOTE — Patient Instructions (Signed)
Medication Instructions:  START VALSARTAN 80 MG DAILY  INCREASE LASIX TO 40 MG TWO TIMES DAILY INCREASE POTASSIUM TO 20 MEQ TWO TIMES DAILY   Labwork: NONE  Testing/Procedures: NONE  Follow-Up: Your physician recommends that you schedule a follow-up appointment in: 2 MONTHS    Any Other Special Instructions Will Be Listed Below (If Applicable).     If you need a refill on your cardiac medications before your next appointment, please call your pharmacy.

## 2016-02-14 NOTE — Progress Notes (Signed)
Patient ID: Roneisha Urbain, female   DOB: 1956-01-24, 60 y.o.   MRN: [id number]      SUBJECTIVE: The patient returns for follow-up after undergoing cardiovascular testing performed for the evaluation of CHF and carotid bruit. Nuclear stress testing showed no perfusion defects consistent with prior infarction or ischemia. There was a hypertensive response to exercise. Carotid Dopplers showed moderate left-sided plaque disease with no hemodynamically significant stenosis.  She continues to have leg swelling. She remains short of breath and has awoken at night with shortness of breath. She denies chest pain.  Review of Systems: As per "subjective", otherwise negative.  Allergies  Allergen Reactions  . Ace Inhibitors Cough    Current Outpatient Prescriptions  Medication Sig Dispense Refill  . aspirin EC 81 MG tablet Take 1 tablet (81 mg total) by mouth daily. 30 tablet 0  . carvedilol (COREG) 12.5 MG tablet Take 12.5 mg by mouth 2 (two) times daily with a meal.    . furosemide (LASIX) 40 MG tablet Take 1 tablet (40 mg total) by mouth daily. 30 tablet 1  . Glucose Blood (BLOOD GLUCOSE TEST STRIPS) STRP Use as directed 100 each 0  . Insulin Detemir (LEVEMIR [place]) Inject 30 Units into the skin daily.    . Insulin Pen Needle 29G X 10MM MISC Use as directed 100 each 0  . potassium chloride (K-DUR) 10 MEQ tablet Take 1 tablet (10 mEq total) by mouth 2 (two) times daily. 30 tablet 0  . sitaGLIPtin-metformin (JANUMET) 50-1000 MG tablet Take 2 tablets by mouth daily.     No current facility-administered medications for this visit.    Past Medical History  Diagnosis Date  . Diabetes mellitus   . Hypertension   . Hyperlipidemia   . Stroke ([place]) 09/2014    numbness left upper lip, finger tips on left hand    Past Surgical History  Procedure Laterality Date  . Tubal ligation    . Partial hysterectomy  1999    fibroids    Social History   Social History  . Marital Status: Single   Spouse Name: N/A  . Number of Children: N/A  . Years of Education: 16   Occupational History  . hairdresser    Social History Main Topics  . Smoking status: Never Smoker   . Smokeless tobacco: Never Used  . Alcohol Use: No  . Drug Use: No  . Sexual Activity: Not Currently   Other Topics Concern  . Not on file   Social History Narrative   Married, 2 children, hairdresser   Caffeine use- occasional tea or coffee   Right handed        Filed Vitals:   02/14/16 0821  BP: 158/90  Pulse: 97  Height: 5\' 5"  (1.651 m)  Weight: 198 lb (89.812 kg)  SpO2: 94%    PHYSICAL EXAM General: NAD Neck: No JVD, no thyromegaly or thyroid nodule.  Lungs: Clear to auscultation bilaterally with normal respiratory effort. CV: Nondisplaced PMI. Regular rate and rhythm, normal S1/S2, no S3/S4, no murmur. 1+ pitting pretibial edema b/l. Left carotid bruit.  Abdomen: Soft, nontender, no distention.  Skin: Intact without lesions or rashes.  Neurologic: Alert and oriented x 3.  Psych: Normal affect. Extremities: No clubbing or cyanosis.  HEENT: Normal.     ECG: Most recent ECG reviewed.      ASSESSMENT AND PLAN: 1. 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 I will aim to control BP by adding valsartan 80 mg daily. Nuclear stress testing showed no perfusion defects consistent with prior infarction or ischemia. There was a hypertensive response to exercise.  2. Malignant HTN: Elevated. I will aim to control BP by adding valsartan 80 mg daily.  3. Demand ischemia: Nuclear stress testing showed no perfusion defects consistent with prior infarction or ischemia. There was a hypertensive response to exercise.  4. Left carotid bruit/disease: Carotid Dopplers showed moderate left-sided plaque disease with no hemodynamically significant stenosis.  Dispo: fu 2  months.   Kate Sable, M.D., F.A.C.C.

## 2016-02-14 NOTE — Telephone Encounter (Signed)
Pt wanted to see if there is any other medication that she could try because the losartan is going to be $300 and there is no way she is able to afford that. Please advise.

## 2016-02-14 NOTE — Telephone Encounter (Signed)
Patient would like to speak with nurse regarding RX from [facility] this morningl. / tg

## 2016-02-14 NOTE — Telephone Encounter (Signed)
I started valsartan. If this is what is too expensive, can try losartan 25 mg daily.

## 2016-03-09 ENCOUNTER — Emergency Department (HOSPITAL_COMMUNITY)
Admission: EM | Admit: 2016-03-09 | Discharge: 2016-03-09 | Disposition: A | Discharge status: Home / Self Care | Payer: Self-pay | Attending: Emergency Medicine | Admitting: Emergency Medicine

## 2016-03-09 ENCOUNTER — Encounter (HOSPITAL_COMMUNITY): Payer: Self-pay | Admitting: Emergency Medicine

## 2016-03-09 ENCOUNTER — Other Ambulatory Visit: Payer: Self-pay

## 2016-03-09 ENCOUNTER — Emergency Department (HOSPITAL_COMMUNITY): Payer: Self-pay

## 2016-03-09 DIAGNOSIS — I5041 Acute combined systolic (congestive) and diastolic (congestive) heart failure: Secondary | ICD-10-CM | POA: Insufficient documentation

## 2016-03-09 DIAGNOSIS — Z794 Long term (current) use of insulin: Secondary | ICD-10-CM | POA: Insufficient documentation

## 2016-03-09 DIAGNOSIS — I10 Essential (primary) hypertension: Secondary | ICD-10-CM

## 2016-03-09 DIAGNOSIS — E119 Type 2 diabetes mellitus without complications: Secondary | ICD-10-CM | POA: Insufficient documentation

## 2016-03-09 DIAGNOSIS — I509 Heart failure, unspecified: Secondary | ICD-10-CM

## 2016-03-09 DIAGNOSIS — Z79899 Other long term (current) drug therapy: Secondary | ICD-10-CM | POA: Insufficient documentation

## 2016-03-09 DIAGNOSIS — Z7982 Long term (current) use of aspirin: Secondary | ICD-10-CM | POA: Insufficient documentation

## 2016-03-09 DIAGNOSIS — I110 Hypertensive heart disease with heart failure: Secondary | ICD-10-CM | POA: Insufficient documentation

## 2016-03-09 LAB — CBC
HCT: 31.8 % — ABNORMAL LOW (ref 36.0–46.0)
HEMOGLOBIN: 10.3 g/dL — AB (ref 12.0–15.0)
MCH: 23.9 pg — ABNORMAL LOW (ref 26.0–34.0)
MCHC: 32.4 g/dL (ref 30.0–36.0)
MCV: 73.8 fL — ABNORMAL LOW (ref 78.0–100.0)
Platelets: 343 10*3/uL (ref 150–400)
RBC: 4.31 MIL/uL (ref 3.87–5.11)
RDW: 15.4 % (ref 11.5–15.5)
WBC: 5 10*3/uL (ref 4.0–10.5)

## 2016-03-09 LAB — BASIC METABOLIC PANEL
ANION GAP: 4 — AB (ref 5–15)
BUN: 16 mg/dL (ref 6–20)
CALCIUM: 8.3 mg/dL — AB (ref 8.9–10.3)
CO2: 26 mmol/L (ref 22–32)
Chloride: 104 mmol/L (ref 101–111)
Creatinine, Ser: 1.01 mg/dL — ABNORMAL HIGH (ref 0.44–1.00)
GFR calc Af Amer: >60 mL/min (ref >60)
GFR, EST NON AFRICAN AMERICAN: 60 mL/min — AB (ref >60)
GLUCOSE: 407 mg/dL — AB (ref 65–99)
Potassium: 3.8 mmol/L (ref 3.5–5.1)
SODIUM: 134 mmol/L — AB (ref 135–145)

## 2016-03-09 LAB — TROPONIN I: Troponin I: 0.03 ng/mL (ref <0.03)

## 2016-03-09 LAB — BRAIN NATRIURETIC PEPTIDE: B Natriuretic Peptide: 871 pg/mL — ABNORMAL HIGH (ref 0.0–100.0)

## 2016-03-09 LAB — CBG MONITORING, ED: GLUCOSE-CAPILLARY: 286 mg/dL — AB (ref 65–99)

## 2016-03-09 MED ORDER — INSULIN ASPART 100 UNIT/ML ~~LOC~~ SOLN
10.0000 [IU] | Freq: Once | SUBCUTANEOUS | Status: AC
  Administered 2016-03-09: 10 [IU] via SUBCUTANEOUS
  Filled 2016-03-09: qty 1

## 2016-03-09 MED ORDER — HYDRALAZINE HCL 20 MG/ML IJ SOLN
10.0000 mg | INTRAMUSCULAR | Status: AC
  Administered 2016-03-09: 10 mg via INTRAVENOUS
  Filled 2016-03-09: qty 1

## 2016-03-09 MED ORDER — FUROSEMIDE 10 MG/ML IJ SOLN
40.0000 mg | INTRAMUSCULAR | Status: AC
  Administered 2016-03-09: 40 mg via INTRAVENOUS
  Filled 2016-03-09: qty 4

## 2016-03-09 NOTE — Discharge Instructions (Signed)
Please start to take your Lasix 3 times daily for the next 7 days - then go back to twice daily You MUST return to the ER if you have worsening shortness of breath or swelling You MUST see your doctor in the next 3 days for recheck  Please obtain all of your results from medical records or have your doctors office obtain the results - share them with your doctor - you should be seen at your doctors office in the next 2 days. Call today to arrange your follow up. Take the medications as prescribed. Please review all of the medicines and only take them if you do not have an allergy to them. Please be aware that if you are taking birth control pills, taking other prescriptions, ESPECIALLY ANTIBIOTICS may make the birth control ineffective - if this is the case, either do not engage in sexual activity or use alternative methods of birth control such as condoms until you have finished the medicine and your family doctor says it is OK to restart them. If you are on a blood thinner such as COUMADIN, be aware that any other medicine that you take may cause the coumadin to either work too much, or not enough - you should have your coumadin level rechecked in next 7 days if this is the case.  ?  It is also a possibility that you have an allergic reaction to any of the medicines that you have been prescribed - Everybody reacts differently to medications and while MOST people have no trouble with most medicines, you may have a reaction such as nausea, vomiting, rash, swelling, shortness of breath. If this is the case, please stop taking the medicine immediately and contact your physician.  ?  You should return to the ER if you develop severe or worsening symptoms.

## 2016-03-09 NOTE — ED Triage Notes (Signed)
PT c/o left chest pain that started today with some SOB at times x2 weeks. PT also states recent increased in stress level and anxiety. PT also c/o swelling to bilateral legs and feet worsening x2 weeks.

## 2016-03-09 NOTE — ED Notes (Signed)
CRITICAL VALUE ALERT  Critical value received:  Troponin 0.03  Date of notification:  03/09/16  Time of notification:  [id number]  Critical value read back:Yes.    Nurse who received alert:  Charmayne Sheer, RN  MD notified (1st page):  Sabra Heck

## 2016-03-09 NOTE — ED Provider Notes (Signed)
[facility] DEPT Provider Note   CSN: [id number] Arrival date & time: 03/09/16  1631  First Provider Contact:  First MD Initiated Contact with Patient 03/09/16 1724        History   Chief Complaint Chief Complaint  Patient presents with  . Chest Pain    HPI Amber Young is a 60 y.o. female.   The patient is a 60 year old female who has a history of congestive heart failure, she also has hypertension and diabetes, was admitted to the hospital in May during which time she was having chest pain shortness of breath and in part of her workup was noted to have new onset congestive heart failure with an ejection fraction between 45 and 55% and a negative stress test. She was placed on diuretic therapy and required increasing control over her blood pressure which was very high at that time.   The patient currently takes Lasix 40 mg twice a day, Coreg, losartan, insulin and Janumet. She states that despite taking this medication she continues to have high blood pressure and continues to have swelling in her legs which seems to get worse throughout the day. She also has dyspnea on exertion, shortness of breath when she lays down. 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      Past Medical History:  Diagnosis Date  . Diabetes mellitus   . Hyperlipidemia   . Hypertension   . Stroke ([place]) 09/2014   numbness left upper lip, finger tips on left hand    Patient Active Problem List   Diagnosis Date Noted  . Acute combined systolic and diastolic CHF, NYHA class 1 ([place]) 11/14/2015  . Hypokalemia 11/14/2015  . Acute respiratory failure with hypoxia ([place]) 11/14/2015  . Hypertensive emergency   . Stroke ([place]) 09/30/2014  . Acute CVA (cerebrovascular accident) ([place]) 09/30/2014  . Uncontrolled hypertension 09/30/2014  .  Uncontrolled diabetes mellitus ([place]) 09/30/2014  . CVA (cerebral infarction) 09/30/2014    Past Surgical History:  Procedure Laterality Date  . PARTIAL HYSTERECTOMY  1999   fibroids  . TUBAL LIGATION      OB History    Gravida Para Term Preterm AB Living   2         2   SAB TAB Ectopic Multiple Live Births                   Home Medications    Prior to Admission medications   Medication Sig Start Date End Date Taking? Authorizing Provider  aspirin EC 81 MG tablet Take 1 tablet (81 mg total) by mouth daily. 10/02/14  Yes Belkys A Regalado, MD  carvedilol (COREG) 12.5 MG tablet Take 12.5 mg by mouth 2 (two) times daily with a meal.   Yes Historical Provider, MD  furosemide (LASIX) 40 MG tablet Take 1 tablet (40 mg total) by mouth 2 (two) times daily. 02/14/16  Yes Herminio Commons, MD  insulin detemir (LEVEMIR) 100 UNIT/ML injection Inject 30 Units into the skin daily.   Yes Historical Provider, MD  losartan (COZAAR) 25 MG tablet Take 1 tablet (25 mg total) by mouth daily. 02/14/16  Yes Herminio Commons, MD  potassium chloride [place] (K-DUR,KLOR-CON) 20 MEQ tablet Take 1 tablet (20 mEq total) by mouth 2 (two) times daily. 02/14/16  Yes Jamesetta So A  Bronson Ing, MD  sitaGLIPtin-metformin (JANUMET) 50-1000 MG tablet Take 1 tablet by mouth 2 (two) times daily with a meal.    Yes Historical Provider, MD  Glucose Blood (BLOOD GLUCOSE TEST STRIPS) STRP Use as directed 11/17/15   Kathie Dike, MD  Insulin Pen Needle 29G X 10MM MISC Use as directed 11/17/15   Kathie Dike, MD    Family History Family History  Problem Relation Age of Onset  . Diabetes Mother   . Hypertension Sister   . Hypertension Brother     Social History Social History  Substance Use Topics  . Smoking status: Never Smoker  . Smokeless tobacco: Never Used  . Alcohol use No     Allergies   Ace inhibitors   Review of Systems Review of Systems  All other systems reviewed and are negative.    Physical  Exam Updated Vital Signs BP 188/89   Pulse (!) 18   Temp 98.3 F (36.8 C) (Oral)   Resp 12   Ht 5\' 6"  (1.676 m)   Wt 200 lb (90.7 kg)   SpO2 92%   BMI 32.28 kg/m   Physical Exam  Constitutional: She appears well-developed and well-nourished. No distress.  HENT:  Head: Normocephalic and atraumatic.  Mouth/Throat: Oropharynx is clear and moist. No oropharyngeal exudate.  Eyes: Conjunctivae and EOM are normal. Pupils are equal, round, and reactive to light. Right eye exhibits no discharge. Left eye exhibits no discharge. No scleral icterus.  Neck: Normal range of motion. Neck supple. No JVD present. No thyromegaly present.  Cardiovascular: Normal rate, regular rhythm, normal heart sounds and intact distal pulses.  Exam reveals no gallop and no friction rub.   No murmur heard.  No systolic murmur is present   No diastolic murmur is present  Pulses:      Radial pulses are 2+ on the right side, and 2+ on the left side.  JVD present  Pulmonary/Chest: Effort normal and breath sounds normal. No respiratory distress. She has no wheezes. She has no rhonchi. She has no rales.  Abdominal: Soft. Bowel sounds are normal. She exhibits no distension and no mass. There is no tenderness.  Musculoskeletal: Normal range of motion. She exhibits edema ( bilateral LE edema, symmetrical). She exhibits no tenderness.  Lymphadenopathy:    She has no cervical adenopathy.  Neurological: She is alert. Coordination normal.  Skin: Skin is warm and dry. No rash noted. No erythema.  Psychiatric: She has a normal mood and affect. Her behavior is normal.  Nursing note and vitals reviewed.    ED Treatments / Results  Labs (all labs ordered are listed, but only abnormal results are displayed) Labs Reviewed  BASIC METABOLIC PANEL - Abnormal; Notable for the following:       Result Value   Sodium 134 (*)    Glucose, Bld 407 (*)    Creatinine, Ser 1.01 (*)    Calcium 8.3 (*)    GFR calc non Af Amer 60 (*)     Anion gap 4 (*)    All other components within normal limits  CBC - Abnormal; Notable for the following:    Hemoglobin 10.3 (*)    HCT 31.8 (*)    MCV 73.8 (*)    MCH 23.9 (*)    All other components within normal limits  TROPONIN I - Abnormal; Notable for the following:    Troponin I 0.03 (*)    All other components within normal limits  BRAIN NATRIURETIC PEPTIDE - Abnormal;  Notable for the following:    B Natriuretic Peptide 871.0 (*)    All other components within normal limits  CBG MONITORING, ED - Abnormal; Notable for the following:    Glucose-Capillary 286 (*)    All other components within normal limits    EKG  EKG Interpretation  Date/Time:  Monday March 09 2016 16:33:29 EDT Ventricular Rate:  98 PR Interval:  158 QRS Duration: 84 QT Interval:  384 QTC Calculation: 490 R Axis:   5 Text Interpretation:  Normal sinus rhythm Abnormal QRS-T angle, consider primary T wave abnormality Prolonged QT Abnormal ECG since last tracing no significant change Confirmed by Sabra Heck  MD, Kayleb Warshaw ([id number]) on 03/09/2016 5:33:02 PM       Radiology Dg Chest 2 View  Result Date: 03/09/2016 CLINICAL DATA:  Shortness of breath. EXAM: CHEST  2 VIEW COMPARISON:  December 05, 2015 FINDINGS: Cardiomegaly. No pulmonary nodules or masses. Minimal atelectasis in the bases and small effusions. No overt edema. No other acute abnormalities. IMPRESSION: Small effusions and bibasilar atelectasis. Cardiomegaly. No overt edema. Electronically Signed   By: Dorise Bullion III M.D   On: 03/09/2016 17:27    Procedures Procedures (including critical care time)  Medications Ordered in ED Medications  furosemide (LASIX) injection 40 mg (40 mg Intravenous Given 03/09/16 1827)  hydrALAZINE (APRESOLINE) injection 10 mg (10 mg Intravenous Given 03/09/16 1827)  insulin aspart (novoLOG) injection 10 Units (10 Units Subcutaneous Given 03/09/16 1855)     Initial Impression / Assessment and Plan / ED Course  I have  reviewed the triage vital signs and the nursing notes.  Pertinent labs & imaging results that were available during my care of the patient were reviewed by me and considered in my medical decision making (see chart for details).  Clinical Course    The pt does not appear ill, she is comfortable appearing, and speaks in full sentences.  She does have JVD and edema - she states the edema is chronic - check labs, give lasix and BP control.  BP improved, urinated X 3 - feeling better - labs reassuring - trop chronically leaky positive, BNP elevated expectedly - pt will increase lasix X 3 days and see her cardiologist this week.  Stable for d/c.  Pt in agreement  Final Clinical Impressions(s) / ED Diagnoses   Final diagnoses:  Congestive heart failure, unspecified congestive heart failure chronicity, unspecified congestive heart failure type [facility])  Essential hypertension    New Prescriptions New Prescriptions   No medications on file     Noemi Chapel, MD 03/09/16 2043

## 2016-03-09 NOTE — ED Notes (Signed)
Pt denies pain but states she feels anxious.

## 2016-03-09 NOTE — ED Notes (Signed)
Pt urinated x 1 since lasix given. Nuns cap in toilet and advised pt to let us know each time she urinates so we can measure. Pt agreed. nad

## 2016-04-26 ENCOUNTER — Inpatient Hospital Stay (HOSPITAL_COMMUNITY)
Admission: EM | Admit: 2016-04-26 | Discharge: 2016-04-30 | DRG: 292 | Disposition: A | Discharge status: Home / Self Care | Payer: Self-pay | Attending: Family Medicine | Admitting: Family Medicine

## 2016-04-26 ENCOUNTER — Encounter (HOSPITAL_COMMUNITY): Payer: Self-pay | Admitting: Emergency Medicine

## 2016-04-26 ENCOUNTER — Emergency Department (HOSPITAL_COMMUNITY): Payer: Self-pay

## 2016-04-26 DIAGNOSIS — K5900 Constipation, unspecified: Secondary | ICD-10-CM | POA: Diagnosis present

## 2016-04-26 DIAGNOSIS — E1151 Type 2 diabetes mellitus with diabetic peripheral angiopathy without gangrene: Secondary | ICD-10-CM

## 2016-04-26 DIAGNOSIS — I110 Hypertensive heart disease with heart failure: Principal | ICD-10-CM | POA: Diagnosis present

## 2016-04-26 DIAGNOSIS — Z833 Family history of diabetes mellitus: Secondary | ICD-10-CM

## 2016-04-26 DIAGNOSIS — IMO0002 Reserved for concepts with insufficient information to code with codable children: Secondary | ICD-10-CM | POA: Diagnosis present

## 2016-04-26 DIAGNOSIS — I5043 Acute on chronic combined systolic (congestive) and diastolic (congestive) heart failure: Secondary | ICD-10-CM | POA: Diagnosis present

## 2016-04-26 DIAGNOSIS — E876 Hypokalemia: Secondary | ICD-10-CM | POA: Diagnosis present

## 2016-04-26 DIAGNOSIS — K219 Gastro-esophageal reflux disease without esophagitis: Secondary | ICD-10-CM | POA: Diagnosis present

## 2016-04-26 DIAGNOSIS — Z7982 Long term (current) use of aspirin: Secondary | ICD-10-CM

## 2016-04-26 DIAGNOSIS — E1165 Type 2 diabetes mellitus with hyperglycemia: Secondary | ICD-10-CM | POA: Diagnosis present

## 2016-04-26 DIAGNOSIS — I161 Hypertensive emergency: Secondary | ICD-10-CM | POA: Diagnosis present

## 2016-04-26 DIAGNOSIS — Z8249 Family history of ischemic heart disease and other diseases of the circulatory system: Secondary | ICD-10-CM

## 2016-04-26 DIAGNOSIS — D649 Anemia, unspecified: Secondary | ICD-10-CM | POA: Diagnosis present

## 2016-04-26 DIAGNOSIS — D509 Iron deficiency anemia, unspecified: Secondary | ICD-10-CM

## 2016-04-26 DIAGNOSIS — I509 Heart failure, unspecified: Secondary | ICD-10-CM

## 2016-04-26 DIAGNOSIS — Z794 Long term (current) use of insulin: Secondary | ICD-10-CM

## 2016-04-26 DIAGNOSIS — Z8673 Personal history of transient ischemic attack (TIA), and cerebral infarction without residual deficits: Secondary | ICD-10-CM

## 2016-04-26 HISTORY — DX: Gastro-esophageal reflux disease without esophagitis: K21.9

## 2016-04-26 HISTORY — DX: Heart failure, unspecified: I50.9

## 2016-04-26 HISTORY — DX: Anemia, unspecified: D64.9

## 2016-04-26 LAB — CBC WITH DIFFERENTIAL/PLATELET
Basophils Absolute: 0 10*3/uL (ref 0.0–0.1)
Basophils Relative: 0 %
EOS ABS: 0.1 10*3/uL (ref 0.0–0.7)
EOS PCT: 1 %
HEMATOCRIT: 31.8 % — AB (ref 36.0–46.0)
HEMOGLOBIN: 10.4 g/dL — AB (ref 12.0–15.0)
LYMPHS ABS: 1.9 10*3/uL (ref 0.7–4.0)
LYMPHS PCT: 36 %
MCH: 23.6 pg — AB (ref 26.0–34.0)
MCHC: 32.7 g/dL (ref 30.0–36.0)
MCV: 72.3 fL — AB (ref 78.0–100.0)
MONOS PCT: 11 %
Monocytes Absolute: 0.6 10*3/uL (ref 0.1–1.0)
Neutro Abs: 2.7 10*3/uL (ref 1.7–7.7)
Neutrophils Relative %: 52 %
PLATELETS: 314 10*3/uL (ref 150–400)
RBC: 4.4 MIL/uL (ref 3.87–5.11)
RDW: 15.8 % — ABNORMAL HIGH (ref 11.5–15.5)
WBC: 5.3 10*3/uL (ref 4.0–10.5)

## 2016-04-26 LAB — TROPONIN I: Troponin I: <0.03 ng/mL (ref <0.03)

## 2016-04-26 LAB — CBG MONITORING, ED
GLUCOSE-CAPILLARY: 380 mg/dL — AB (ref 65–99)
Glucose-Capillary: 367 mg/dL — ABNORMAL HIGH (ref 65–99)
Glucose-Capillary: 365 mg/dL — ABNORMAL HIGH (ref 65–99)

## 2016-04-26 LAB — BASIC METABOLIC PANEL
ANION GAP: 8 (ref 5–15)
BUN: 16 mg/dL (ref 6–20)
CALCIUM: 9 mg/dL (ref 8.9–10.3)
CO2: 29 mmol/L (ref 22–32)
CREATININE: 0.8 mg/dL (ref 0.44–1.00)
Chloride: 103 mmol/L (ref 101–111)
GFR calc Af Amer: >60 mL/min (ref >60)
GLUCOSE: 194 mg/dL — AB (ref 65–99)
Potassium: 3 mmol/L — ABNORMAL LOW (ref 3.5–5.1)
Sodium: 140 mmol/L (ref 135–145)

## 2016-04-26 LAB — BRAIN NATRIURETIC PEPTIDE: B Natriuretic Peptide: 666 pg/mL — ABNORMAL HIGH (ref 0.0–100.0)

## 2016-04-26 MED ORDER — ENOXAPARIN SODIUM 40 MG/0.4ML ~~LOC~~ SOLN
40.0000 mg | SUBCUTANEOUS | Status: DC
  Administered 2016-04-26 – 2016-04-29 (×4): 40 mg via SUBCUTANEOUS
  Filled 2016-04-26 (×4): qty 0.4

## 2016-04-26 MED ORDER — ACETAMINOPHEN 325 MG PO TABS: 650.0000 mg | ORAL_TABLET | Freq: Four times a day (QID) | ORAL | Status: DC | PRN

## 2016-04-26 MED ORDER — HYDRALAZINE HCL 20 MG/ML IJ SOLN
10.0000 mg | Freq: Four times a day (QID) | INTRAMUSCULAR | Status: DC | PRN
  Administered 2016-04-26: 10 mg via INTRAVENOUS
  Filled 2016-04-26: qty 1

## 2016-04-26 MED ORDER — CARVEDILOL 3.125 MG PO TABS
6.2500 mg | ORAL_TABLET | Freq: Two times a day (BID) | ORAL | Status: DC
  Administered 2016-04-27 – 2016-04-28 (×3): 6.25 mg via ORAL
  Filled 2016-04-26 (×3): qty 2

## 2016-04-26 MED ORDER — INSULIN DETEMIR 100 UNIT/ML ~~LOC~~ SOLN
30.0000 [IU] | Freq: Every day | SUBCUTANEOUS | Status: DC
  Administered 2016-04-26: 30 [IU] via SUBCUTANEOUS
  Filled 2016-04-26 (×3): qty 0.3

## 2016-04-26 MED ORDER — FUROSEMIDE 10 MG/ML IJ SOLN
40.0000 mg | Freq: Two times a day (BID) | INTRAMUSCULAR | Status: DC
  Administered 2016-04-26 – 2016-04-28 (×4): 40 mg via INTRAVENOUS
  Filled 2016-04-26 (×4): qty 4

## 2016-04-26 MED ORDER — INSULIN ASPART 100 UNIT/ML ~~LOC~~ SOLN
4.0000 [IU] | Freq: Once | SUBCUTANEOUS | Status: AC
  Administered 2016-04-26: 4 [IU] via SUBCUTANEOUS
  Filled 2016-04-26: qty 1

## 2016-04-26 MED ORDER — POTASSIUM CHLORIDE CRYS ER 20 MEQ PO TBCR
20.0000 meq | EXTENDED_RELEASE_TABLET | Freq: Two times a day (BID) | ORAL | Status: DC
  Administered 2016-04-26 – 2016-04-27 (×2): 20 meq via ORAL
  Filled 2016-04-26 (×2): qty 1

## 2016-04-26 MED ORDER — NITROGLYCERIN 2 % TD OINT
1.0000 [in_us] | TOPICAL_OINTMENT | Freq: Three times a day (TID) | TRANSDERMAL | Status: DC
  Administered 2016-04-26 – 2016-04-27 (×2): 1 [in_us] via TOPICAL
  Filled 2016-04-26 (×2): qty 1

## 2016-04-26 MED ORDER — SODIUM CHLORIDE 0.9 % IV SOLN: 250.0000 mL | INTRAVENOUS | Status: DC | PRN

## 2016-04-26 MED ORDER — INSULIN DETEMIR 100 UNIT/ML ~~LOC~~ SOLN
SUBCUTANEOUS | Status: AC
  Filled 2016-04-26: qty 1

## 2016-04-26 MED ORDER — LOSARTAN POTASSIUM 50 MG PO TABS
25.0000 mg | ORAL_TABLET | Freq: Every day | ORAL | Status: DC
  Administered 2016-04-27: 25 mg via ORAL
  Filled 2016-04-26: qty 1

## 2016-04-26 MED ORDER — SODIUM CHLORIDE 0.9% FLUSH
3.0000 mL | Freq: Two times a day (BID) | INTRAVENOUS | Status: DC
  Administered 2016-04-26 – 2016-04-30 (×7): 3 mL via INTRAVENOUS

## 2016-04-26 MED ORDER — FUROSEMIDE 10 MG/ML IJ SOLN
40.0000 mg | Freq: Once | INTRAMUSCULAR | Status: AC
  Administered 2016-04-26: 40 mg via INTRAVENOUS
  Filled 2016-04-26: qty 4

## 2016-04-26 MED ORDER — ATORVASTATIN CALCIUM 40 MG PO TABS
40.0000 mg | ORAL_TABLET | Freq: Every day | ORAL | Status: DC
  Administered 2016-04-27 – 2016-04-30 (×4): 40 mg via ORAL
  Filled 2016-04-26 (×4): qty 1

## 2016-04-26 MED ORDER — SODIUM CHLORIDE 0.9% FLUSH: 3.0000 mL | INTRAVENOUS | Status: DC | PRN

## 2016-04-26 MED ORDER — INSULIN ASPART 100 UNIT/ML ~~LOC~~ SOLN
0.0000 [IU] | Freq: Every day | SUBCUTANEOUS | Status: DC
  Administered 2016-04-26: 5 [IU] via SUBCUTANEOUS
  Administered 2016-04-27: 4 [IU] via SUBCUTANEOUS
  Administered 2016-04-28 – 2016-04-29 (×2): 3 [IU] via SUBCUTANEOUS
  Filled 2016-04-26: qty 1

## 2016-04-26 MED ORDER — SITAGLIPTIN PHOS-METFORMIN HCL 50-1000 MG PO TABS: 1.0000 | ORAL_TABLET | Freq: Two times a day (BID) | ORAL | Status: DC

## 2016-04-26 MED ORDER — POTASSIUM CHLORIDE CRYS ER 20 MEQ PO TBCR
40.0000 meq | EXTENDED_RELEASE_TABLET | Freq: Once | ORAL | Status: AC
  Administered 2016-04-26: 40 meq via ORAL
  Filled 2016-04-26: qty 2

## 2016-04-26 MED ORDER — ACETAMINOPHEN 650 MG RE SUPP: 650.0000 mg | Freq: Four times a day (QID) | RECTAL | Status: DC | PRN

## 2016-04-26 MED ORDER — INSULIN ASPART 100 UNIT/ML ~~LOC~~ SOLN
0.0000 [IU] | Freq: Three times a day (TID) | SUBCUTANEOUS | Status: DC
Start: 2016-04-27 — End: 2016-04-30
  Administered 2016-04-27 – 2016-04-28 (×4): 3 [IU] via SUBCUTANEOUS
  Administered 2016-04-28: 5 [IU] via SUBCUTANEOUS
  Administered 2016-04-29: 1 [IU] via SUBCUTANEOUS
  Administered 2016-04-29 (×2): 3 [IU] via SUBCUTANEOUS
  Administered 2016-04-30: 2 [IU] via SUBCUTANEOUS
  Administered 2016-04-30: 9 [IU] via SUBCUTANEOUS

## 2016-04-26 NOTE — ED Provider Notes (Signed)
[facility] DEPT Provider Note   CSN: [id number] Arrival date & time: 04/26/16  1114     History   Chief Complaint Chief Complaint  Patient presents with  . Shortness of Breath    HPI Amber Young is a 60 y.o. female.  HPI  Pt was seen at 1305. Per pt, c/o gradual onset and worsening of persistent SOB for the past 2 days. Has been associated with increasing pedal edema. States she has hx of CHF, and has been taking her lasix as prescribed. Denies CP/palpitations, no cough, no abd pain, no N/V/D, no back pain.   Past Medical History:  Diagnosis Date  . CHF (congestive heart failure) ([place])   . Diabetes mellitus   . Hyperlipidemia   . Hypertension   . Stroke ([place]) 09/2014   numbness left upper lip, finger tips on left hand    Patient Active Problem List   Diagnosis Date Noted  . Acute combined systolic and diastolic CHF, NYHA class 1 ([place]) 11/14/2015  . Hypokalemia 11/14/2015  . Acute respiratory failure with hypoxia ([place]) 11/14/2015  . Hypertensive emergency   . Stroke ([place]) 09/30/2014  . Acute CVA (cerebrovascular accident) ([place]) 09/30/2014  . Uncontrolled hypertension 09/30/2014  . Uncontrolled diabetes mellitus ([place]) 09/30/2014  . CVA (cerebral infarction) 09/30/2014    Past Surgical History:  Procedure Laterality Date  . PARTIAL HYSTERECTOMY  1999   fibroids  . TUBAL LIGATION      OB History    Gravida Para Term Preterm AB Living   2         2   SAB TAB Ectopic Multiple Live Births                   Home Medications    Prior to Admission medications   Medication Sig Start Date End Date Taking? Authorizing Provider  aspirin EC 81 MG tablet Take 1 tablet (81 mg total) by mouth daily. 10/02/14  Yes Belkys A Regalado, MD  atorvastatin (LIPITOR) 40 MG tablet Take 40 mg by mouth daily.   Yes Historical Provider, MD  carvedilol (COREG) 6.25 MG tablet Take 6.25 mg by mouth 2 (two) times daily with a meal.   Yes Historical Provider, MD  furosemide  (LASIX) 40 MG tablet Take 1 tablet (40 mg total) by mouth 2 (two) times daily. 02/14/16  Yes Herminio Commons, MD  insulin detemir (LEVEMIR) 100 UNIT/ML injection Inject 30 Units into the skin daily.   Yes Historical Provider, MD  sitaGLIPtin-metformin (JANUMET) 50-1000 MG tablet Take 1 tablet by mouth 2 (two) times daily with a meal.    Yes Historical Provider, MD  Glucose Blood (BLOOD GLUCOSE TEST STRIPS) STRP Use as directed 11/17/15   Kathie Dike, MD  Insulin Pen Needle 29G X 10MM MISC Use as directed 11/17/15   Kathie Dike, MD  losartan (COZAAR) 25 MG tablet Take 1 tablet (25 mg total) by mouth daily. 02/14/16   Herminio Commons, MD  potassium chloride SA (K-DUR,KLOR-CON) 20 MEQ tablet Take 1 tablet (20 mEq total) by mouth 2 (two) times daily. Patient not taking: Reported on 04/26/2016 02/14/16   Herminio Commons, MD    Family History Family History  Problem Relation Age of Onset  . Diabetes Mother   . Hypertension Sister   . Hypertension Brother     Social History Social History  Substance Use Topics  . Smoking status: Never Smoker  . Smokeless tobacco: Never Used  . Alcohol use No  Allergies   Ace inhibitors   Review of Systems Review of Systems ROS: Statement: All systems negative except as marked or noted in the HPI; Constitutional: Negative for fever and chills. ; ; Eyes: Negative for eye pain, redness and discharge. ; ; ENMT: Negative for ear pain, hoarseness, nasal congestion, sinus pressure and sore throat. ; ; Cardiovascular: Negative for chest pain, palpitations, diaphoresis. +dyspnea and peripheral edema. ; ; Respiratory: Negative for cough, wheezing and stridor. ; ; Gastrointestinal: Negative for nausea, vomiting, diarrhea, abdominal pain, blood in stool, hematemesis, jaundice and rectal bleeding. . ; ; Genitourinary: Negative for dysuria, flank pain and hematuria. ; ; Musculoskeletal: Negative for back pain and neck pain. Negative for swelling and trauma.; ;  Skin: Negative for pruritus, rash, abrasions, blisters, bruising and skin lesion.; ; Neuro: Negative for headache, lightheadedness and neck stiffness. Negative for weakness, altered level of consciousness, altered mental status, extremity weakness, paresthesias, involuntary movement, seizure and syncope.       Physical Exam Updated Vital Signs BP (!) 199/114 (BP Location: Right Arm)   Pulse 92   Temp 98.4 F (36.9 C) (Oral)   Resp 18   Ht 5' 6.5" (1.689 m)   Wt 187 lb (84.8 kg)   SpO2 99%   BMI 29.73 kg/m   Physical Exam 1310: Physical examination:  Nursing notes reviewed; Vital signs and O2 SAT reviewed;  Constitutional: Well developed, Well nourished, Well hydrated, In no acute distress; Head:  Normocephalic, atraumatic; Eyes: EOMI, PERRL, No scleral icterus; ENMT: Mouth and pharynx normal, Mucous membranes moist; Neck: Supple, Full range of motion, No lymphadenopathy; Cardiovascular: Regular rate and rhythm, No gallop; Respiratory: Breath sounds coarse & equal bilaterally, No wheezes.  Speaking full sentences with ease, Normal respiratory effort/excursion; Chest: Nontender, Movement normal; Abdomen: Soft, Nontender, Nondistended, Normal bowel sounds; Genitourinary: No CVA tenderness; Extremities: Pulses normal, No tenderness, +2 pedal edema bilat, no calf asymmetry.; Neuro: AA&Ox3, Major CN grossly intact.  Speech clear. No gross focal motor or sensory deficits in extremities.; Skin: Color normal, Warm, Dry.   ED Treatments / Results  Labs (all labs ordered are listed, but only abnormal results are displayed)   EKG  EKG Interpretation  Date/Time:  Sunday April 26 2016 13:12:15 EDT Ventricular Rate:  88 PR Interval:    QRS Duration: 96 QT Interval:  415 QTC Calculation: 503 R Axis:   47 Text Interpretation:  Sinus rhythm Probable left atrial enlargement Nonspecific T abnormalities, lateral leads Borderline prolonged QT interval Baseline wander When compared with ECG of  03/09/2016 No significant change was found Confirmed by [facility]  MD, Nunzio Cory [phone]) on 04/26/2016 2:09:41 PM       Radiology   Procedures Procedures (including critical care time)  Medications Ordered in ED Medications  potassium chloride SA (K-DUR,KLOR-CON) CR tablet 40 mEq (not administered)     Initial Impression / Assessment and Plan / ED Course  I have reviewed the triage vital signs and the nursing notes.  Pertinent labs & imaging results that were available during my care of the patient were reviewed by me and considered in my medical decision making (see chart for details).  MDM Reviewed: previous chart, nursing note and vitals Reviewed previous: labs and ECG Interpretation: labs, ECG and x-ray   Results for orders placed or performed during the hospital encounter of 47/82/95  Basic metabolic panel  Result Value Ref Range   Sodium 140 135 - 145 mmol/L   Potassium 3.0 (L) 3.5 - 5.1 mmol/L   Chloride  103 101 - 111 mmol/L   CO2 29 22 - 32 mmol/L   Glucose, Bld 194 (H) 65 - 99 mg/dL   BUN 16 6 - 20 mg/dL   Creatinine, Ser 0.80 0.44 - 1.00 mg/dL   Calcium 9.0 8.9 - 10.3 mg/dL   GFR calc non Af Amer >60 >60 mL/min   GFR calc Af Amer >60 >60 mL/min   Anion gap 8 5 - 15  Troponin I  Result Value Ref Range   Troponin I <0.03 <0.03 ng/mL  Brain natriuretic peptide  Result Value Ref Range   B Natriuretic Peptide 666.0 (H) 0.0 - 100.0 pg/mL  CBC with Differential  Result Value Ref Range   WBC 5.3 4.0 - 10.5 K/uL   RBC 4.40 3.87 - 5.11 MIL/uL   Hemoglobin 10.4 (L) 12.0 - 15.0 g/dL   HCT 31.8 (L) 36.0 - 46.0 %   MCV 72.3 (L) 78.0 - 100.0 fL   MCH 23.6 (L) 26.0 - 34.0 pg   MCHC 32.7 30.0 - 36.0 g/dL   RDW 15.8 (H) 11.5 - 15.5 %   Platelets 314 150 - 400 K/uL   Neutrophils Relative % 52 %   Neutro Abs 2.7 1.7 - 7.7 K/uL   Lymphocytes Relative 36 %   Lymphs Abs 1.9 0.7 - 4.0 K/uL   Monocytes Relative 11 %   Monocytes Absolute 0.6 0.1 - 1.0 K/uL   Eosinophils  Relative 1 %   Eosinophils Absolute 0.1 0.0 - 0.7 K/uL   Basophils Relative 0 %   Basophils Absolute 0.0 0.0 - 0.1 K/uL   Dg Chest 2 View Result Date: 04/26/2016 CLINICAL DATA:  Shortness of breath and asleep for couple days, swelling in legs and feet, diabetes mellitus, hypertension, hyperlipidemia, prior stroke EXAM: CHEST  2 VIEW COMPARISON:  03/09/2016 FINDINGS: Enlargement of cardiac silhouette with slight pulmonary vascular congestion. Mediastinal contours normal. Peribronchial thickening with slightly increased perihilar markings which may represent minimal chronic failure. Mild RIGHT basilar atelectasis. No gross pleural effusion or segmental consolidation. No pneumothorax. Bones unremarkable. IMPRESSION: Enlargement of cardiac silhouette with pulmonary vascular congestion and question mild pulmonary edema. Minimal RIGHT basilar atelectasis. Electronically Signed   By: Lavonia Dana M.D.   On: 04/26/2016 11:54    1515:  BNP elevated with pulmonary edema on CXR; will dose IV lasix. Potassium repleted PO. T/C to [facility] Dr. Maudie Mercury, case discussed, including:  HPI, pertinent PM/SHx, VS/PE, dx testing, ED course and treatment:  Agreeable to admit, requests to write temporary orders, obtain tele bed to team APAdmits.   Final Clinical Impressions(s) / ED Diagnoses   Final diagnoses:  None    New Prescriptions New Prescriptions   No medications on file     Francine Graven, DO 04/29/16 1650

## 2016-04-26 NOTE — ED Triage Notes (Signed)
Sob times 2 days.  Denies any pain.

## 2016-04-26 NOTE — ED Notes (Addendum)
Patient went to restroom and returned to bed. Patient's heart rate became tachy in the 130's and 140's that was sustained for 15 minutes. Informed Dr. Maudie Mercury. Dr. Maudie Mercury states to monitor patient. Patient's heart rate dropped to 110 to 118.

## 2016-04-26 NOTE — ED Notes (Signed)
Patient CBG 367 after 5 Units of Novolog. Dr. Maudie Mercury informed and is ok with results.  Verbal from Dr. Maudie Mercury for 4 Units of Novolog.

## 2016-04-26 NOTE — H&P (Addendum)
[facility] H&P   Patient Demographics:    Amber Young, is a 60 y.o. female  MRN: [id number]   DOB - 28-Jul-1956  Admit Date - 04/26/2016  Outpatient Primary MD for the patient is Pcp Not In System Health Dept  Referring MD/NP/PA: Margette Fast  Outpatient Specialists: [facility]   Patient coming from:  home  Chief Complaint  Patient presents with  . Shortness of Breath      HPI:    Amber Young  is a 60 y.o. female, w hypertension, hyperlipidemia, Dm2, CHF (EF 45%), apparently presents with not feeling well for the past couple of days.  Pt c/o dyspnea, + edema.  denies fever, chills, cp, palp, orthopnea, pnd, weight gain.   Pt notes has not taken her bp medication this am.   In ED, found to be hypertensive,  CXR showed vascular congestion and her bnp was elevated at 666.  Trop negative.   EKG => nsr at 88, nl axis, nl int, no st- t changes.   Pt was also noted to be hypokalemic with K=3.0. Pt given lasix and potassium in ED.   Pt will be admitted for CHF secondary to hypertensive emergency as well as hypokalemia.     Review of systems:    In addition to the HPI above, No Fever-chills, No Headache, No changes with Vision or hearing, No problems swallowing food or Liquids, No Chest pain, Cough  No Abdominal pain, No Nausea or Vommitting, Bowel movements are regular, No Blood in stool or Urine, No dysuria, No new skin rashes or bruises, No new joints pains-aches,  No new weakness, tingling, numbness in any extremity, No recent weight gain or loss, No polyuria, polydypsia or polyphagia, No significant Mental Stressors.  A full 10 point Review of Systems was done, except as stated above, all other Review of Systems were negative.   With Past History of the following :    Past Medical History:  Diagnosis Date  . Anemia   . CHF (congestive heart failure) ([place])    . Diabetes mellitus   . GERD (gastroesophageal reflux disease)   . Hyperlipidemia   . Hypertension   . Stroke ([place]) 09/2014   numbness left upper lip, finger tips on left hand      Past Surgical History:  Procedure Laterality Date  . PARTIAL HYSTERECTOMY  1999   fibroids  . TUBAL LIGATION        Social History:     Social History  Substance Use Topics  . Smoking status: Never Smoker  . Smokeless tobacco: Never Used  . Alcohol use No     Lives -  At home  Mobility -   Ambulates by self   Family History :     Family History  Problem Relation Age of Onset  . Diabetes Mother   . Hypertension Mother   . COPD Mother   .  Heart failure Mother   . Hypertension Sister   . Hypertension Brother       Home Medications:   Prior to Admission medications   Medication Sig Start Date End Date Taking? Authorizing Provider  aspirin EC 81 MG tablet Take 1 tablet (81 mg total) by mouth daily. 10/02/14  Yes Belkys A Regalado, MD  atorvastatin (LIPITOR) 40 MG tablet Take 40 mg by mouth daily.   Yes Historical Provider, MD  carvedilol (COREG) 6.25 MG tablet Take 6.25 mg by mouth 2 (two) times daily with a meal.   Yes Historical Provider, MD  furosemide (LASIX) 40 MG tablet Take 1 tablet (40 mg total) by mouth 2 (two) times daily. 02/14/16  Yes Herminio Commons, MD  insulin detemir (LEVEMIR) 100 UNIT/ML injection Inject 30 Units into the skin daily.   Yes Historical Provider, MD  sitaGLIPtin-metformin (JANUMET) 50-1000 MG tablet Take 1 tablet by mouth 2 (two) times daily with a meal.    Yes Historical Provider, MD  Glucose Blood (BLOOD GLUCOSE TEST STRIPS) STRP Use as directed 11/17/15   Kathie Dike, MD  Insulin Pen Needle 29G X 10MM MISC Use as directed 11/17/15   Kathie Dike, MD  losartan (COZAAR) 25 MG tablet Take 1 tablet (25 mg total) by mouth daily. 02/14/16   Herminio Commons, MD  potassium chloride SA (K-DUR,KLOR-CON) 20 MEQ tablet Take 1 tablet (20 mEq total) by mouth 2  (two) times daily. Patient not taking: Reported on 04/26/2016 02/14/16   Herminio Commons, MD     Allergies:     Allergies  Allergen Reactions  . Ace Inhibitors Cough     Physical Exam:   Vitals  Blood pressure (!) 199/114, pulse 92, temperature 98.4 F (36.9 C), temperature source Oral, resp. rate 18, height 5' 6.5" (1.689 m), weight 84.8 kg (187 lb), SpO2 99 %.   1. General  lying in bed in NAD,    2. Normal affect and insight, Not Suicidal or Homicidal, Awake Alert, Oriented X 3.  3. No F.N deficits, ALL C.Nerves Intact, Strength 5/5 all 4 extremities, Sensation intact all 4 extremities, Plantars down going.  4. Ears and Eyes appear Normal, Conjunctivae clear, PERRLA. Moist Oral Mucosa.  5. Supple Neck, slight JVD, No cervical lymphadenopathy appriciated, No Carotid Bruits.  6. Symmetrical Chest wall movement, Good air movement bilaterally, slight crackles bilateral base, no wheeze  7. RRR, No Gallops, Rubs or Murmurs, No Parasternal Heave.  8. Positive Bowel Sounds, Abdomen Soft, No tenderness, No organomegaly appriciated,No rebound -guarding or rigidity.  9.  No Cyanosis, Normal Skin Turgor, No Skin Rash or Bruise.  10. Good muscle tone,  joints appear normal , no effusions, Normal ROM.  11. No Palpable Lymph Nodes in Neck or Axillae     Data Review:    CBC  Recent Labs Lab 04/26/16 1322  WBC 5.3  HGB 10.4*  HCT 31.8*  PLT 314  MCV 72.3*  MCH 23.6*  MCHC 32.7  RDW 15.8*  LYMPHSABS 1.9  MONOABS 0.6  EOSABS 0.1  BASOSABS 0.0   ------------------------------------------------------------------------------------------------------------------  Chemistries   Recent Labs Lab 04/26/16 1322  NA 140  K 3.0*  CL 103  CO2 29  GLUCOSE 194*  BUN 16  CREATININE 0.80  CALCIUM 9.0   ------------------------------------------------------------------------------------------------------------------ estimated creatinine clearance is 82.9 mL/min (by C-G  formula based on SCr of 0.8 mg/dL). ------------------------------------------------------------------------------------------------------------------ No results for input(s): TSH, T4TOTAL, T3FREE, THYROIDAB in the last 72 hours.  Invalid input(s): FREET3  Coagulation profile No results for input(s): INR, PROTIME in the last 168 hours. ------------------------------------------------------------------------------------------------------------------- No results for input(s): DDIMER in the last 72 hours. -------------------------------------------------------------------------------------------------------------------  Cardiac Enzymes  Recent Labs Lab 04/26/16 1322  TROPONINI <0.03   ------------------------------------------------------------------------------------------------------------------    Component Value Date/Time   BNP 666.0 (H) 04/26/2016 1322     ---------------------------------------------------------------------------------------------------------------  Urinalysis    Component Value Date/Time   COLORURINE YELLOW 12/05/2015 [place] 12/05/2015 1755   LABSPEC 1.030 12/05/2015 1755   PHURINE 5.5 12/05/2015 1755   GLUCOSEU 100 (A) 12/05/2015 1755   HGBUR MODERATE (A) 12/05/2015 [place] 12/05/2015 [place] 12/05/2015 1755   PROTEINUR 100 (A) 12/05/2015 1755   UROBILINOGEN 1.0 04/12/2013 2208   NITRITE NEGATIVE 12/05/2015 1755   LEUKOCYTESUR TRACE (A) 12/05/2015 1755    ----------------------------------------------------------------------------------------------------------------   Imaging Results:    Dg Chest 2 View  Result Date: 04/26/2016 CLINICAL DATA:  Shortness of breath and asleep for couple days, swelling in legs and feet, diabetes mellitus, hypertension, hyperlipidemia, prior stroke EXAM: CHEST  2 VIEW COMPARISON:  03/09/2016 FINDINGS: Enlargement of cardiac silhouette with slight pulmonary  vascular congestion. Mediastinal contours normal. Peribronchial thickening with slightly increased perihilar markings which may represent minimal chronic failure. Mild RIGHT basilar atelectasis. No gross pleural effusion or segmental consolidation. No pneumothorax. Bones unremarkable. IMPRESSION: Enlargement of cardiac silhouette with pulmonary vascular congestion and question mild pulmonary edema. Minimal RIGHT basilar atelectasis. Electronically Signed   By: Lavonia Dana M.D.   On: 04/26/2016 11:54     Assessment & Plan:    Principal Problem:   Acute combined systolic and diastolic CHF, NYHA class 1 (HCC) Active Problems:   Uncontrolled diabetes mellitus ([place])   Hypertensive emergency   Hypokalemia   Anemia    1.  CHF secondary to hypertension uncontrolled Tele Trop I q6h x3,  Check cardiac echo.  NTP 1 inch topically q8h Lasix 40mg  iv bid Potassium 20 meq po qday  Hypertensive urgency Restart her home bp medications Hydralazine 10mg  iv q6h prn sbp >160  Anemia Check cbc in am,  Consider further w/up  Hypokalemia Replete Check cmp in am  Dm2 fsbs ac and qhs, iss    DVT Prophylaxis Lovenox - SCDs  AM Labs Ordered, also please review Full Orders  Family Communication: Admission, patients condition and plan of care including tests being ordered have been discussed with the patient who indicate understanding and agree with the plan and Code Status.  Code Status FULL CODE  Likely DC to  home  Condition GUARDED    Consults called:   Admission status: nipatient   Time spent in minutes : 45 minutes   Jani Gravel M.D on 04/26/2016 at 3:31 PM  Between 7am to 7pm - Pager - [phone]. After 7pm go to [email or web address] - password [facility]  [facility] - Office  [phone]

## 2016-04-27 ENCOUNTER — Inpatient Hospital Stay (HOSPITAL_COMMUNITY): Payer: Self-pay

## 2016-04-27 ENCOUNTER — Ambulatory Visit: Payer: Self-pay | Admitting: Cardiovascular Disease

## 2016-04-27 DIAGNOSIS — I509 Heart failure, unspecified: Secondary | ICD-10-CM

## 2016-04-27 LAB — COMPREHENSIVE METABOLIC PANEL
ALBUMIN: 2.8 g/dL — AB (ref 3.5–5.0)
ALK PHOS: 155 U/L — AB (ref 38–126)
ALT: 63 U/L — ABNORMAL HIGH (ref 14–54)
ANION GAP: 8 (ref 5–15)
AST: 40 U/L (ref 15–41)
BUN: 17 mg/dL (ref 6–20)
CALCIUM: 8.4 mg/dL — AB (ref 8.9–10.3)
CHLORIDE: 105 mmol/L (ref 101–111)
CO2: 29 mmol/L (ref 22–32)
Creatinine, Ser: 0.79 mg/dL (ref 0.44–1.00)
GFR calc non Af Amer: >60 mL/min (ref >60)
GLUCOSE: 97 mg/dL (ref 65–99)
POTASSIUM: 2.9 mmol/L — AB (ref 3.5–5.1)
SODIUM: 142 mmol/L (ref 135–145)
Total Bilirubin: 0.6 mg/dL (ref 0.3–1.2)
Total Protein: 6 g/dL — ABNORMAL LOW (ref 6.5–8.1)

## 2016-04-27 LAB — ECHOCARDIOGRAM COMPLETE
Height: 66.5 in
Weight: 3051.17 oz

## 2016-04-27 LAB — TSH: TSH: 1.011 u[IU]/mL (ref 0.350–4.500)

## 2016-04-27 LAB — CBC
HEMATOCRIT: 32 % — AB (ref 36.0–46.0)
HEMOGLOBIN: 10.4 g/dL — AB (ref 12.0–15.0)
MCH: 23.5 pg — AB (ref 26.0–34.0)
MCHC: 32.5 g/dL (ref 30.0–36.0)
MCV: 72.2 fL — AB (ref 78.0–100.0)
Platelets: 325 10*3/uL (ref 150–400)
RBC: 4.43 MIL/uL (ref 3.87–5.11)
RDW: 15.9 % — ABNORMAL HIGH (ref 11.5–15.5)
WBC: 5.9 10*3/uL (ref 4.0–10.5)

## 2016-04-27 LAB — BASIC METABOLIC PANEL
ANION GAP: 8 (ref 5–15)
BUN: 16 mg/dL (ref 6–20)
CALCIUM: 8.4 mg/dL — AB (ref 8.9–10.3)
CO2: 29 mmol/L (ref 22–32)
Chloride: 98 mmol/L — ABNORMAL LOW (ref 101–111)
Creatinine, Ser: 0.89 mg/dL (ref 0.44–1.00)
Glucose, Bld: 217 mg/dL — ABNORMAL HIGH (ref 65–99)
Potassium: 3.6 mmol/L (ref 3.5–5.1)
SODIUM: 135 mmol/L (ref 135–145)

## 2016-04-27 LAB — TROPONIN I
TROPONIN I: 0.03 ng/mL — AB (ref <0.03)
Troponin I: 0.03 ng/mL (ref <0.03)
Troponin I: 0.04 ng/mL (ref <0.03)

## 2016-04-27 LAB — MRSA PCR SCREENING: MRSA BY PCR: NEGATIVE

## 2016-04-27 LAB — GLUCOSE, CAPILLARY
GLUCOSE-CAPILLARY: 59 mg/dL — AB (ref 65–99)
GLUCOSE-CAPILLARY: 193 mg/dL — AB (ref 65–99)
Glucose-Capillary: 225 mg/dL — ABNORMAL HIGH (ref 65–99)
Glucose-Capillary: 318 mg/dL — ABNORMAL HIGH (ref 65–99)

## 2016-04-27 MED ORDER — ASPIRIN EC 81 MG PO TBEC
81.0000 mg | DELAYED_RELEASE_TABLET | Freq: Every day | ORAL | Status: DC
  Administered 2016-04-27 – 2016-04-30 (×4): 81 mg via ORAL
  Filled 2016-04-27 (×4): qty 1

## 2016-04-27 MED ORDER — LOSARTAN POTASSIUM 50 MG PO TABS
25.0000 mg | ORAL_TABLET | Freq: Once | ORAL | Status: AC
  Administered 2016-04-27: 25 mg via ORAL
  Filled 2016-04-27: qty 1

## 2016-04-27 MED ORDER — INSULIN DETEMIR 100 UNIT/ML ~~LOC~~ SOLN
8.0000 [IU] | Freq: Every day | SUBCUTANEOUS | Status: DC
  Administered 2016-04-27 – 2016-04-29 (×3): 8 [IU] via SUBCUTANEOUS
  Filled 2016-04-27 (×5): qty 0.08

## 2016-04-27 MED ORDER — LOSARTAN POTASSIUM 50 MG PO TABS
50.0000 mg | ORAL_TABLET | Freq: Every day | ORAL | Status: DC
  Administered 2016-04-28 – 2016-04-30 (×3): 50 mg via ORAL
  Filled 2016-04-27 (×3): qty 1

## 2016-04-27 MED ORDER — POTASSIUM CHLORIDE CRYS ER 20 MEQ PO TBCR
40.0000 meq | EXTENDED_RELEASE_TABLET | ORAL | Status: AC
  Administered 2016-04-27 (×2): 40 meq via ORAL
  Filled 2016-04-27 (×2): qty 2

## 2016-04-27 NOTE — Progress Notes (Signed)
Transferred to 339 in stable condition via w/c, reported to M. Luan Pulling, RN.

## 2016-04-27 NOTE — Progress Notes (Signed)
[facility] Progress Note  Patient: Amber Young GHW:[id number]   PCP: Pcp Not In System DOB: [birth year]   DOA: 04/26/2016   DOS: 04/27/2016   Date of Service: the patient was seen and examined on 04/27/2016  Brief hospital course: Pt. with PMH of Chronic combined CHF, uncontrolled diabetes mellitus, etc. hypertension, chronic anemia; admitted on 04/26/2016, with complaint of shortness of breath, was found to have acute on chronic combined CHF. Currently further plan is continue IV diuresis.  Assessment and Plan: 1. Acute combined systolic and diastolic CHF, NYHA class 1 (HCC) Continue IV Lasix. Increase losartan to 50 mg. Continue Lipitor, continue Coreg, continue aspirin. Continue when necessary hydralazine. Transfer the patient to telemetry.  2. Hypokalemia. Continue potassium supplementation. Recheck potassium at 8 PM. Replace as needed.  recheck in the morning.  3. Uncontrolled diabetes mellitus.  A1c pending Hypoglycemia. Patient is on Levemir 30 units daily as well as Janumet at home. Currently blood sugars are actually running a lower side. We will decrease the Levemir to 8 units daily. And continue sliding scale insulin.  Pain management: When necessary Tylenol Activity: no indication for physical therapy Bowel regimen: last BM PTA Diet: Carb modified diet DVT Prophylaxis: subcutaneous Heparin  Advance goals of care discussion: Full code  Family Communication: no family was present at bedside, at the time of interview.  Disposition:  Discharge to home. Expected discharge date: 04/28/2016, improvement in volume status  Consultants: None Procedures: Echocardiogram pending  Antibiotics: Anti-infectives    None        Subjective: Patient is feeling better. Had orthopnea as well as PND at home and currently lying down disc without any respiratory distress. Nutrition: Tolerating oral diet  Objective: Physical Exam: Vitals:   04/27/16 0400 04/27/16  0500 04/27/16 0807 04/27/16 1155  BP:      Pulse:      Resp:      Temp: 97 F (36.1 C)  97 F (36.1 C) 97.9 F (36.6 C)  TempSrc: Oral  Axillary Oral  SpO2: 100%     Weight:  86.5 kg (190 lb 11.2 oz)    Height:        Intake/Output Summary (Last 24 hours) at 04/27/16 1545 Last data filed at 04/27/16 1500  Gross per 24 hour  Intake              823 ml  Output             3950 ml  Net            -3127 ml   Filed Weights   04/26/16 1128 04/26/16 2223 04/27/16 0500  Weight: 84.8 kg (187 lb) 87.7 kg (193 lb 5.5 oz) 86.5 kg (190 lb 11.2 oz)    General: Alert, Awake and Oriented to Time, Place and Person. Appear in mild distress, affect appropriate Eyes: PERRL, Conjunctiva normal ENT: Oral Mucosa clear moist. Neck: difficult to assess JVD, no Abnormal Mass Or lumps Cardiovascular: S1 and S2 Present, no Murmur, Respiratory: Bilateral Air entry equal and Decreased, no use of accessory muscle, Clear to Auscultation, no Crackles, no wheezes Abdomen: Bowel Sound present, Soft and no tenderness Skin: no redness, no Rash, no induration Extremities: bilateral Pedal edema, no calf tenderness Neurologic: Grossly no focal neuro deficit. Bilaterally Equal motor strength  Data Reviewed: CBC:  Recent Labs Lab 04/26/16 1322 04/27/16 0428  WBC 5.3 5.9  NEUTROABS 2.7  --   HGB 10.4* 10.4*  HCT 31.8* 32.0*  MCV 72.3* 72.2*  PLT 314 197   Basic Metabolic Panel:  Recent Labs Lab 04/26/16 1322 04/27/16 0428  NA 140 142  K 3.0* 2.9*  CL 103 105  CO2 29 29  GLUCOSE 194* 97  BUN 16 17  CREATININE 0.80 0.79  CALCIUM 9.0 8.4*    Liver Function Tests:  Recent Labs Lab 04/27/16 0428  AST 40  ALT 63*  ALKPHOS 155*  BILITOT 0.6  PROT 6.0*  ALBUMIN 2.8*   No results for input(s): LIPASE, AMYLASE in the last 168 hours. No results for input(s): AMMONIA in the last 168 hours. Coagulation Profile: No results for input(s): INR, PROTIME in the last 168 hours. Cardiac  Enzymes:  Recent Labs Lab 04/26/16 1322 04/26/16 2341 04/27/16 0428 04/27/16 1020  TROPONINI <0.03 0.03* 0.04* 0.03*   BNP (last 3 results) No results for input(s): PROBNP in the last 8760 hours.  CBG:  Recent Labs Lab 04/26/16 2116 04/26/16 2202 04/27/16 0746 04/27/16 0844 04/27/16 1137  GLUCAP 367* 365* 59* 193* 225*    Studies: No results found.   Scheduled Meds: . aspirin EC  81 mg Oral Daily  . atorvastatin  40 mg Oral Daily  . carvedilol  6.25 mg Oral BID WC  . enoxaparin (LOVENOX) injection  40 mg Subcutaneous Q24H  . furosemide  40 mg Intravenous Q12H  . insulin aspart  0-5 Units Subcutaneous QHS  . insulin aspart  0-9 Units Subcutaneous TID WC  . insulin detemir  8 Units Subcutaneous Daily  . [START ON 04/28/2016] losartan  50 mg Oral Daily  . potassium chloride  40 mEq Oral Q2H  . sodium chloride flush  3 mL Intravenous Q12H   Continuous Infusions:  PRN Meds: sodium chloride, acetaminophen **OR** acetaminophen, hydrALAZINE, sodium chloride flush  Time spent: 30 minutes  Author: Berle Mull, MD [facility] Pager: [phone] 04/27/2016 3:45 PM  If 7PM-7AM, please contact night-coverage at [email or web address], password [facility]

## 2016-04-27 NOTE — Progress Notes (Signed)
Trop of 0.04 called by lab. Reported to MD via text page

## 2016-04-27 NOTE — Care Management Note (Signed)
Case Management Note  Patient Details  Name: Amber Young MRN: [id number] Date of Birth: [birth year]  Subjective/Objective:                  Pt is from home, she is ind with ADL's. She has no insurance and will be referred to [facility]. She has cardiologist and goes to the [facility] for PCP care. She is able to afford her medications. She plans to return home with self care at DC.   Action/Plan: No CM needs anticipated.   Expected Discharge Date:    04/27/2016              Expected Discharge Plan:  Home/Self Care  In-House Referral:  NA  Discharge planning Services  CM Consult  Post Acute Care Choice:  NA Choice offered to:  NA  DME Arranged:    DME Agency:     HH Arranged:    HH Agency:     Status of Service:  Completed, signed off  If discussed at [employer] of Stay Meetings, dates discussed:    Additional Comments:  Sherald Barge, RN 04/27/2016, 2:50 PM

## 2016-04-27 NOTE — Progress Notes (Signed)
*  PRELIMINARY RESULTS* Echocardiogram 2D Echocardiogram has been performed.  Amber Young 04/27/2016, 1:05 PM

## 2016-04-28 LAB — BASIC METABOLIC PANEL
ANION GAP: 7 (ref 5–15)
BUN: 15 mg/dL (ref 6–20)
CHLORIDE: 102 mmol/L (ref 101–111)
CO2: 30 mmol/L (ref 22–32)
Calcium: 8.4 mg/dL — ABNORMAL LOW (ref 8.9–10.3)
Creatinine, Ser: 0.88 mg/dL (ref 0.44–1.00)
GFR calc non Af Amer: >60 mL/min (ref >60)
Glucose, Bld: 179 mg/dL — ABNORMAL HIGH (ref 65–99)
POTASSIUM: 4 mmol/L (ref 3.5–5.1)
SODIUM: 139 mmol/L (ref 135–145)

## 2016-04-28 LAB — CBC
HCT: 33.9 % — ABNORMAL LOW (ref 36.0–46.0)
HEMOGLOBIN: 10.8 g/dL — AB (ref 12.0–15.0)
MCH: 23.4 pg — AB (ref 26.0–34.0)
MCHC: 31.9 g/dL (ref 30.0–36.0)
MCV: 73.4 fL — AB (ref 78.0–100.0)
Platelets: 334 10*3/uL (ref 150–400)
RBC: 4.62 MIL/uL (ref 3.87–5.11)
RDW: 16.5 % — AB (ref 11.5–15.5)
WBC: 6.6 10*3/uL (ref 4.0–10.5)

## 2016-04-28 LAB — MAGNESIUM: Magnesium: 1.5 mg/dL — ABNORMAL LOW (ref 1.7–2.4)

## 2016-04-28 LAB — GLUCOSE, CAPILLARY
GLUCOSE-CAPILLARY: 228 mg/dL — AB (ref 65–99)
GLUCOSE-CAPILLARY: 56 mg/dL — AB (ref 65–99)
GLUCOSE-CAPILLARY: 285 mg/dL — AB (ref 65–99)
GLUCOSE-CAPILLARY: 218 mg/dL — AB (ref 65–99)
Glucose-Capillary: 60 mg/dL — ABNORMAL LOW (ref 65–99)
Glucose-Capillary: 227 mg/dL — ABNORMAL HIGH (ref 65–99)
Glucose-Capillary: 296 mg/dL — ABNORMAL HIGH (ref 65–99)

## 2016-04-28 LAB — HEMOGLOBIN A1C
Hgb A1c MFr Bld: 11.7 % — ABNORMAL HIGH (ref 4.8–5.6)
Mean Plasma Glucose: 289 mg/dL

## 2016-04-28 MED ORDER — SENNOSIDES-DOCUSATE SODIUM 8.6-50 MG PO TABS: 1.0000 | ORAL_TABLET | Freq: Every evening | ORAL | Status: DC | PRN

## 2016-04-28 MED ORDER — CARVEDILOL 12.5 MG PO TABS: 12.5000 mg | ORAL_TABLET | Freq: Two times a day (BID) | ORAL | Status: DC

## 2016-04-28 MED ORDER — CARVEDILOL 12.5 MG PO TABS
12.5000 mg | ORAL_TABLET | Freq: Two times a day (BID) | ORAL | Status: DC
Start: 2016-04-28 — End: 2016-04-30
  Administered 2016-04-28 – 2016-04-30 (×5): 12.5 mg via ORAL
  Filled 2016-04-28 (×5): qty 1

## 2016-04-28 MED ORDER — POLYETHYLENE GLYCOL 3350 17 G PO PACK
17.0000 g | PACK | Freq: Every day | ORAL | Status: DC
  Administered 2016-04-28 – 2016-04-30 (×3): 17 g via ORAL
  Filled 2016-04-28 (×3): qty 1

## 2016-04-28 MED ORDER — MAGNESIUM SULFATE 2 GM/50ML IV SOLN
2.0000 g | Freq: Once | INTRAVENOUS | Status: AC
  Administered 2016-04-28: 2 g via INTRAVENOUS
  Filled 2016-04-28: qty 50

## 2016-04-28 MED ORDER — FUROSEMIDE 10 MG/ML IJ SOLN
20.0000 mg | Freq: Once | INTRAMUSCULAR | Status: AC
  Administered 2016-04-28: 20 mg via INTRAVENOUS
  Filled 2016-04-28: qty 2

## 2016-04-28 MED ORDER — FUROSEMIDE 10 MG/ML IJ SOLN
60.0000 mg | Freq: Two times a day (BID) | INTRAMUSCULAR | Status: AC
  Administered 2016-04-28 – 2016-04-29 (×3): 60 mg via INTRAVENOUS
  Filled 2016-04-28 (×4): qty 6

## 2016-04-28 NOTE — Progress Notes (Signed)
Inpatient Diabetes Program Recommendations  AACE/ADA: New Consensus Statement on Inpatient Glycemic Control (2015)  Target Ranges:  Prepandial:   less than 140 mg/dL      Peak postprandial:   less than 180 mg/dL (1-2 hours)      Critically ill patients:  140 - 180 mg/dL   Results for ZYKERRIA, TANTON (MRN [id number]) as of 04/28/2016 15:02  Ref. Range 04/27/2016 07:46 04/27/2016 08:44 04/27/2016 11:37 04/27/2016 17:22 04/27/2016 22:03 04/28/2016 05:39 04/28/2016 05:55 04/28/2016 06:58  Glucose-Capillary Latest Ref Range: 65 - 99 mg/dL 59 (L) 193 (H) 225 (H)  Novolog 3 units 228 (H)  Novolog 3 units 318 (H)  Novolog 4 units  Lantus 8 units @ 23:55 56 (L) 60 (L) 227 (H)  Novolog 3 units   Review of Glycemic Control  Current orders for Inpatient glycemic control: Levemir 8 units daily, Novolog 0-9 units TID with meals, Novolog 0-5 units QHS  Inpatient Diabetes Program Recommendations: Insulin - Basal: Note Lantus was decreased from 30 units to 8 units daily at bedtime on 04/27/16. Insulin - Meal Coverage: Post prandial glucose is consistently elevated. Please consider ordering Novolog 4 units TID with meals for meal coverage.  Thanks, Barnie Alderman, RN, MSN, CDE Diabetes Coordinator Inpatient Diabetes Program [phone] (Team Pager from [place] to [place]) [phone] ([facility] office) [phone] [facility] office) [phone] [facility] office)

## 2016-04-28 NOTE — Progress Notes (Signed)
[facility] Progress Note  Patient: Amber Young GBT:[id number]   PCP: Pcp Not In System DOB: [birth year]   DOA: 04/26/2016   DOS: 04/28/2016   Date of Service: the patient was seen and examined on 04/28/2016  Brief hospital course: Pt. with PMH of Chronic combined CHF, uncontrolled diabetes mellitus, etc. hypertension, chronic anemia; admitted on 04/26/2016, with complaint of shortness of breath, was found to have acute on chronic combined CHF. Currently further plan is continue IV diuresis.  Assessment and Plan: 1. Acute combined systolic and diastolic CHF, NYHA class 1 (HCC) Continue IV Lasix.Increase to 60 mg twice a day Increase losartan to 50 mg. Continue Lipitor, continue Coreg, continue aspirin. Increase Coreg to 12.5 mg twice a day. Continue when necessary hydralazine. Continue to monitor patient to telemetry.  2. Hypokalemia. Continue potassium supplementation. Replace as needed.  recheck in the morning.  3. Uncontrolled diabetes mellitus.  A1c pending Hypoglycemia. Patient is on Levemir 30 units daily as well as Janumet at home. Currently blood sugars are actually running a lower side. We will decrease the Levemir to 8 units daily. And continue sliding scale insulin.  4. Abnormal echocardiogram. Patient's ejection fraction is relatively stable but wall motion abnormality shows focal hypokinesis. Discuss with cardiology patient had a recent negative stress test. Patient will follow up with cardiology as an outpatient.  Pain management: When necessary Tylenol Activity: no indication for physical therapy Bowel regimen: last BM 04/25/2016, BOWEL REGIMEN ORDERED. Diet: Carb modified diet DVT Prophylaxis: subcutaneous Heparin  Advance goals of care discussion: Full code  Family Communication: no family was present at bedside, at the time of interview.  Disposition:  Discharge to home. Expected discharge date: 04/29/2016, improvement in volume  status  Consultants: None Procedures: Echocardiogram 4045% EF, focal hypokinesis anterolateral anterior area.  Antibiotics: Anti-infectives    None      Subjective: Feeling better but complains about constipation. Continues to have leg swelling.  Objective: Physical Exam: Vitals:   04/27/16 1155 04/27/16 2200 04/28/16 0541 04/28/16 1302  BP:  (!) 159/90 (!) 161/80 (!) 170/91  Pulse:  73 70 81  Resp:  18 18 18   Temp: 97.9 F (36.6 C) 98 F (36.7 C) 97.6 F (36.4 C) 97.8 F (36.6 C)  TempSrc: Oral Oral Oral Oral  SpO2:  99% 97% 95%  Weight:   86 kg (189 lb 8 oz)   Height:        Intake/Output Summary (Last 24 hours) at 04/28/16 1725 Last data filed at 04/28/16 1200  Gross per 24 hour  Intake              770 ml  Output                0 ml  Net              770 ml   Filed Weights   04/26/16 2223 04/27/16 0500 04/28/16 0541  Weight: 87.7 kg (193 lb 5.5 oz) 86.5 kg (190 lb 11.2 oz) 86 kg (189 lb 8 oz)    General: Alert, Awake and Oriented to Time, Place and Person. Appear in mild distress, affect appropriate Eyes: PERRL, Conjunctiva normal ENT: Oral Mucosa clear moist. Neck: difficult to assess JVD, no Abnormal Mass Or lumps Cardiovascular: S1 and S2 Present, no Murmur, Respiratory: Bilateral Air entry equal and Decreased, no use of accessory muscle, Clear to Auscultation, no Crackles, no wheezes Abdomen: Bowel Sound present, Soft and no tenderness Skin: no redness, no Rash, no induration  Extremities: bilateral Pedal edema, no calf tenderness Neurologic: Grossly no focal neuro deficit. Bilaterally Equal motor strength  Data Reviewed: CBC:  Recent Labs Lab 04/26/16 1322 04/27/16 0428 04/28/16 0640  WBC 5.3 5.9 6.6  NEUTROABS 2.7  --   --   HGB 10.4* 10.4* 10.8*  HCT 31.8* 32.0* 33.9*  MCV 72.3* 72.2* 73.4*  PLT 314 325 585   Basic Metabolic Panel:  Recent Labs Lab 04/26/16 1322 04/27/16 0428 04/27/16 1935 04/28/16 0640  NA 140 142 135 139  K 3.0*  2.9* 3.6 4.0  CL 103 105 98* 102  CO2 29 29 29 30   GLUCOSE 194* 97 217* 179*  BUN 16 17 16 15   CREATININE 0.80 0.79 0.89 0.88  CALCIUM 9.0 8.4* 8.4* 8.4*  MG  --   --   --  1.5*    Liver Function Tests:  Recent Labs Lab 04/27/16 0428  AST 40  ALT 63*  ALKPHOS 155*  BILITOT 0.6  PROT 6.0*  ALBUMIN 2.8*   No results for input(s): LIPASE, AMYLASE in the last 168 hours. No results for input(s): AMMONIA in the last 168 hours. Coagulation Profile: No results for input(s): INR, PROTIME in the last 168 hours. Cardiac Enzymes:  Recent Labs Lab 04/26/16 1322 04/26/16 2341 04/27/16 0428 04/27/16 1020  TROPONINI <0.03 0.03* 0.04* 0.03*   BNP (last 3 results) No results for input(s): PROBNP in the last 8760 hours.  CBG:  Recent Labs Lab 04/28/16 0539 04/28/16 0555 04/28/16 0658 04/28/16 1135 04/28/16 1609  GLUCAP 56* 60* 227* 285* 218*    Studies: No results found.   Scheduled Meds: . aspirin EC  81 mg Oral Daily  . atorvastatin  40 mg Oral Daily  . [START ON 04/29/2016] carvedilol  12.5 mg Oral BID WC  . enoxaparin (LOVENOX) injection  40 mg Subcutaneous Q24H  . furosemide  60 mg Intravenous Q12H  . insulin aspart  0-5 Units Subcutaneous QHS  . insulin aspart  0-9 Units Subcutaneous TID WC  . insulin detemir  8 Units Subcutaneous Daily  . losartan  50 mg Oral Daily  . polyethylene glycol  17 g Oral Daily  . sodium chloride flush  3 mL Intravenous Q12H   Continuous Infusions:  PRN Meds: sodium chloride, acetaminophen **OR** acetaminophen, hydrALAZINE, senna-docusate, sodium chloride flush  Time spent: 30 minutes  Author: Berle Mull, MD [facility] Pager: [phone] 04/28/2016 5:25 PM  If 7PM-7AM, please contact night-coverage at [email or web address], password [facility]

## 2016-04-28 NOTE — Progress Notes (Signed)
Patient discussed with Dr Posey Pronto, echo with evidence of new wall motion abnormalities/hypokinesis compared to echo 11/2015. I have compared both studies side by side and the changes due appear new. LVEF remains mildly decreased around 40%. Patient with nuclear stress test 12/2015 without perfusion defects. From notes she has had recurrent issues with CHF, HTN, and admissions with elevated troponins most consistent with demand ischemia in the setting of severe HTN and CHF. Given her mild systolic dysfunction, new WMAs, normal nuke, and recurrent presentations could consider possible cath for her for definitive evaluation for possible ICM. I will defer to her primary cardiologist Dr Bronson Ing, and make him aware of findings. No indication for inpatient ischemic testing at this time, management of decompensated CHF per medicine team.    Carlyle Dolly MD

## 2016-04-29 DIAGNOSIS — I5041 Acute combined systolic (congestive) and diastolic (congestive) heart failure: Secondary | ICD-10-CM

## 2016-04-29 DIAGNOSIS — D649 Anemia, unspecified: Secondary | ICD-10-CM

## 2016-04-29 DIAGNOSIS — E876 Hypokalemia: Secondary | ICD-10-CM

## 2016-04-29 DIAGNOSIS — E1165 Type 2 diabetes mellitus with hyperglycemia: Secondary | ICD-10-CM

## 2016-04-29 DIAGNOSIS — Z794 Long term (current) use of insulin: Secondary | ICD-10-CM

## 2016-04-29 DIAGNOSIS — E1151 Type 2 diabetes mellitus with diabetic peripheral angiopathy without gangrene: Secondary | ICD-10-CM

## 2016-04-29 LAB — BASIC METABOLIC PANEL
ANION GAP: 6 (ref 5–15)
BUN: 16 mg/dL (ref 6–20)
CALCIUM: 8.6 mg/dL — AB (ref 8.9–10.3)
CO2: 31 mmol/L (ref 22–32)
CREATININE: 0.8 mg/dL (ref 0.44–1.00)
Chloride: 102 mmol/L (ref 101–111)
GFR calc Af Amer: >60 mL/min (ref >60)
GLUCOSE: 151 mg/dL — AB (ref 65–99)
Potassium: 3.5 mmol/L (ref 3.5–5.1)
Sodium: 139 mmol/L (ref 135–145)

## 2016-04-29 LAB — GLUCOSE, CAPILLARY
Glucose-Capillary: 137 mg/dL — ABNORMAL HIGH (ref 65–99)
Glucose-Capillary: 216 mg/dL — ABNORMAL HIGH (ref 65–99)
Glucose-Capillary: 234 mg/dL — ABNORMAL HIGH (ref 65–99)
Glucose-Capillary: 277 mg/dL — ABNORMAL HIGH (ref 65–99)

## 2016-04-29 LAB — MAGNESIUM: Magnesium: 1.9 mg/dL (ref 1.7–2.4)

## 2016-04-29 NOTE — Progress Notes (Signed)
Inpatient Diabetes Program Recommendations  AACE/ADA: New Consensus Statement on Inpatient Glycemic Control (2015)  Target Ranges:  Prepandial:   less than 140 mg/dL      Peak postprandial:   less than 180 mg/dL (1-2 hours)      Critically ill patients:  140 - 180 mg/dL   Results for FERRIN, LIEBIG (MRN [id number]) as of 04/29/2016 14:32  Ref. Range 04/28/2016 05:39 04/28/2016 05:55 04/28/2016 06:58 04/28/2016 11:35 04/28/2016 16:09 04/28/2016 20:03  Glucose-Capillary Latest Ref Range: 65 - 99 mg/dL 56 (L) 60 (L) 227 (H) 285 (H) 218 (H) 296 (H)   Results for RANEISHA, BRESS (MRN [id number]) as of 04/29/2016 14:32  Ref. Range 04/29/2016 07:46 04/29/2016 11:36  Glucose-Capillary Latest Ref Range: 65 - 99 mg/dL 137 (H) 216 (H)     Outpatient Diabetes meds: Levemir 30 units daily            Janumet 50/1000 mg bid  Current Orders: Levemir 8 units daily     Novolog Sensitive Correction Scale/ SSI (0-9 units) TID AC + HS    -Note Lantus was decreased from 30 units to 8 units daily at bedtime on 04/27/16 after patient experienced Hypoglycemia on AM of 09/18 and 09/19.  -Fasting glucose much improved today: CBG 137 mg/dl.  -Patient still having issues with elevated postprandial glucose levels.  Eating 100% of meals.  -Spoke with patient about her elevated A1c of 11.7%.  Explained what an A1c is and what it measures.  Patient told me she knows her A1c needs to be closer to 7% and is willing to work at it to get it down further.  Takes her medications regularly at home.  Sometimes forgets to take her Janumet in the morning but will come home at lunch and take it then if she forgets to take it before she leaves for work.  Checks CBGs 1-2 times daily and has CBG meter and supplies at home.  Gets most medications and her healthcare through the [facility].  -Discussed with pt that based on her CBG trends and the amount of insulin she has been getting here in the hospital, perhaps she needs  more insulin at home (intensification of therapy).  Patient told me she used to take insulin twice a day at home when she lived in [place] but could not recall the exact name of the medication.  Told me she would be willing to take more insulin at home if needed (more frequent injections) but that she would rather just take insulin if she has to take more frequent insulin injections throughout the day (would be open to take 3-4 shots per day but does not want to take injections and oral medications for cost and ease).     MD- Please consider starting Novolog Meal Coverage:  Novolog 4 units tid with meals (hold if pt eats <50% of meal)  Do you think patient needs intensification of her DM medications at home?  Patient stated she would be willing to take insulin more frequently at home but does not want to take insulin and oral medications for ease and for cost considerations.       --Will follow patient during hospitalization--  Wyn Quaker RN, MSN, CDE Diabetes Coordinator Inpatient Glycemic Control Team Team Pager: [phone] (8a-5p)

## 2016-04-29 NOTE — Progress Notes (Signed)
PROGRESS NOTE    Amber Young  OYD:[id number] DOB: 1956-04-23 DOA: 04/26/2016 PCP: Pcp Not In System   Brief Narrative:  Pt. with PMH of Chronic combined CHF, uncontrolled diabetes mellitus, etc. hypertension, chronic anemia; admitted on 04/26/2016, with complaint of shortness of breath, was found to have acute on chronic combined CHF. Currently further plan is continue IV diuresis.   Assessment & Plan:   Principal Problem:   Acute combined systolic and diastolic CHF, NYHA class 1 (HCC) Active Problems:   Uncontrolled diabetes mellitus ([place])   Hypertensive emergency   Hypokalemia   Anemia   CHF, acute on chronic (HCC)   CHF (congestive heart failure) (HCC)   Acute combined systolic and diastolic CHF, NYHA class 1 (HCC) Lasix 60mg  IV BID today Will redraw BMP in am Will transition to PO Lasix in am Increase losartan to 50 mg Continue Lipitor, continue Coreg, continue aspirin Increase Coreg to 12.5 mg twice a day Continue when necessary hydralazine. Continue to monitor patient to telemetry  Hypokalemia. Continue potassium supplementation. Replace as needed.  recheck in the morning.  Uncontrolled diabetes mellitus.   A1c pending Hypoglycemia. Patient is on Levemir 30 units daily as well as Janumet at home (neither continued) Levemir to 8 units daily And continue sliding scale insulin Continue CBG monitoring  Abnormal echocardiogram. Patient's ejection fraction is relatively stable but wall motion abnormality shows focal hypokinesis. Discuss with cardiology patient had a recent negative stress test. Patient will follow up with cardiology as an outpatient.   DVT prophylaxis: subcutaneous Heparin Code Status: Full Code Family Communication: No family bedside  Disposition Plan: home in 24 hours pending diuresis   Consultants:   Curbside consult of Cardiology  Procedures:   none  Antimicrobials:   none    Subjective: Patient voices some  improvement in her shortness of breath today.  Has not tried to lay down flat yet to sleep (something patient reports she has not done for quite some time).  Reports that her leg swelling has improved and is close to her baseline.  Lasix was increased to 60mg  IV BID yesterday.    Objective: Vitals:   04/28/16 2232 04/29/16 0500 04/29/16 1023 04/29/16 1500  BP: (!) 163/67 (!) 161/80 (!) 147/66 (!) 159/83  Pulse: 76 73 61 68  Resp:  18 18 16   Temp: 98.5 F (36.9 C) 97.9 F (36.6 C)  98.1 F (36.7 C)  TempSrc: Oral Oral  Oral  SpO2: 100% 99% 99% 98%  Weight:  85.4 kg (188 lb 4.8 oz)    Height:        Intake/Output Summary (Last 24 hours) at 04/29/16 1609 Last data filed at 04/29/16 1515  Gross per 24 hour  Intake              483 ml  Output             2750 ml  Net            -2267 ml   Filed Weights   04/27/16 0500 04/28/16 0541 04/29/16 0500  Weight: 86.5 kg (190 lb 11.2 oz) 86 kg (189 lb 8 oz) 85.4 kg (188 lb 4.8 oz)    Examination:  General exam: Appears calm and comfortable  Respiratory system: Clear to auscultation. Respiratory effort normal. Cardiovascular system: S1 & S2 heard, RRR. No JVD, murmurs, rubs, gallops or clicks. Minimal Pedal edema. Gastrointestinal system: Abdomen is nondistended, soft and nontender. No organomegaly or masses felt. Normal bowel sounds heard. Central nervous  system: Alert and oriented. No focal neurological deficits. Extremities: Symmetric 5 x 5 power. Skin: No rashes, lesions or ulcers Psychiatry: Judgement and insight appear normal. Mood & affect appropriate.     Data Reviewed: I have personally reviewed following labs and imaging studies  CBC:  Recent Labs Lab 04/26/16 1322 04/27/16 0428 04/28/16 0640  WBC 5.3 5.9 6.6  NEUTROABS 2.7  --   --   HGB 10.4* 10.4* 10.8*  HCT 31.8* 32.0* 33.9*  MCV 72.3* 72.2* 73.4*  PLT 314 325 242   Basic Metabolic Panel:  Recent Labs Lab 04/26/16 1322 04/27/16 0428 04/27/16 1935  04/28/16 0640 04/29/16 0647  NA 140 142 135 139 139  K 3.0* 2.9* 3.6 4.0 3.5  CL 103 105 98* 102 102  CO2 29 29 29 30 31   GLUCOSE 194* 97 217* 179* 151*  BUN 16 17 16 15 16   CREATININE 0.80 0.79 0.89 0.88 0.80  CALCIUM 9.0 8.4* 8.4* 8.4* 8.6*  MG  --   --   --  1.5* 1.9   GFR: Estimated Creatinine Clearance: 83.2 mL/min (by C-G formula based on SCr of 0.8 mg/dL). Liver Function Tests:  Recent Labs Lab 04/27/16 0428  AST 40  ALT 63*  ALKPHOS 155*  BILITOT 0.6  PROT 6.0*  ALBUMIN 2.8*   No results for input(s): LIPASE, AMYLASE in the last 168 hours. No results for input(s): AMMONIA in the last 168 hours. Coagulation Profile: No results for input(s): INR, PROTIME in the last 168 hours. Cardiac Enzymes:  Recent Labs Lab 04/26/16 1322 04/26/16 2341 04/27/16 0428 04/27/16 1020  TROPONINI <0.03 0.03* 0.04* 0.03*   BNP (last 3 results) No results for input(s): PROBNP in the last 8760 hours. HbA1C:  Recent Labs  04/26/16 2341  HGBA1C 11.7*   CBG:  Recent Labs Lab 04/28/16 1135 04/28/16 1609 04/28/16 2003 04/29/16 0746 04/29/16 1136  GLUCAP 285* 218* 296* 137* 216*   Lipid Profile: No results for input(s): CHOL, HDL, LDLCALC, TRIG, CHOLHDL, LDLDIRECT in the last 72 hours. Thyroid Function Tests:  Recent Labs  04/27/16 0428  TSH 1.011   Anemia Panel: No results for input(s): VITAMINB12, FOLATE, FERRITIN, TIBC, IRON, RETICCTPCT in the last 72 hours. Sepsis Labs: No results for input(s): PROCALCITON, LATICACIDVEN in the last 168 hours.  Recent Results (from the past 240 hour(s))  MRSA PCR Screening     Status: None   Collection Time: 04/26/16 10:30 PM  Result Value Ref Range Status   MRSA by PCR NEGATIVE NEGATIVE Final    Comment:        The GeneXpert MRSA Assay (FDA approved for NASAL specimens only), is one component of a comprehensive MRSA colonization surveillance program. It is not intended to diagnose MRSA infection nor to guide  or monitor treatment for MRSA infections.          Radiology Studies: No results found.      Scheduled Meds: . aspirin EC  81 mg Oral Daily  . atorvastatin  40 mg Oral Daily  . carvedilol  12.5 mg Oral BID WC  . enoxaparin (LOVENOX) injection  40 mg Subcutaneous Q24H  . furosemide  60 mg Intravenous Q12H  . insulin aspart  0-5 Units Subcutaneous QHS  . insulin aspart  0-9 Units Subcutaneous TID WC  . insulin detemir  8 Units Subcutaneous Daily  . losartan  50 mg Oral Daily  . polyethylene glycol  17 g Oral Daily  . sodium chloride flush  3 mL Intravenous Q12H  Continuous Infusions:    LOS: 3 days    Time spent: 35 minutes    Newman Pies, MD [facility] Pager [phone]  If 7PM-7AM, please contact night-coverage [email or web address] Password [email or web address] 04/29/2016, 4:09 PM

## 2016-04-30 LAB — BASIC METABOLIC PANEL
ANION GAP: 6 (ref 5–15)
BUN: 18 mg/dL (ref 6–20)
CALCIUM: 8.8 mg/dL — AB (ref 8.9–10.3)
CO2: 33 mmol/L — ABNORMAL HIGH (ref 22–32)
Chloride: 100 mmol/L — ABNORMAL LOW (ref 101–111)
Creatinine, Ser: 0.85 mg/dL (ref 0.44–1.00)
GLUCOSE: 209 mg/dL — AB (ref 65–99)
POTASSIUM: 3.7 mmol/L (ref 3.5–5.1)
SODIUM: 139 mmol/L (ref 135–145)

## 2016-04-30 LAB — GLUCOSE, CAPILLARY
GLUCOSE-CAPILLARY: 191 mg/dL — AB (ref 65–99)
Glucose-Capillary: 363 mg/dL — ABNORMAL HIGH (ref 65–99)

## 2016-04-30 MED ORDER — INSULIN DETEMIR 100 UNIT/ML ~~LOC~~ SOLN: 9.0000 [IU] | Freq: Every day | SUBCUTANEOUS | 11 refills | Status: DC

## 2016-04-30 MED ORDER — LOSARTAN POTASSIUM 50 MG PO TABS: 50.0000 mg | ORAL_TABLET | Freq: Every day | ORAL | 0 refills | Status: DC

## 2016-04-30 MED ORDER — FUROSEMIDE 40 MG PO TABS
40.0000 mg | ORAL_TABLET | Freq: Two times a day (BID) | ORAL | Status: DC
  Administered 2016-04-30 (×2): 40 mg via ORAL
  Filled 2016-04-30 (×2): qty 1

## 2016-04-30 NOTE — Discharge Summary (Signed)
Physician Discharge Summary  Amber Young LDJ:[id number] DOB: Feb 01, 1956 DOA: 04/26/2016  PCP: Pcp Not In System  Admit date: 04/26/2016 Discharge date: 04/30/2016  Admitted From: Home Disposition:  Home  Recommendations for Outpatient Follow-up:  1. Establish care with PCP in 1-2 weeks 2. Please obtain BMP in one week 3. Weigh yourself daily 4. Follow up with cardiologist at previously scheduled appointment next week 5. Call SW/ CM tomorrow am to ask about disability   Home Health:No Equipment/Devices: None  Discharge Condition:Stable and improved  CODE STATUS:Full code Diet recommendation: Heart Healthy carb Modified diet   Brief/Interim Summary: Pt. with PMH of Chronic combined CHF, uncontrolled diabetes mellitus, etc. hypertension, chronic anemia; admitted on 04/26/2016, with complaint of shortness of breath, was found to have acute on chronic combined CHF. Currently further plan is continue IV diuresis.  Discharge Diagnoses:  Principal Problem:   Acute combined systolic and diastolic CHF, NYHA class 1 (HCC) Active Problems:   Uncontrolled diabetes mellitus (HCC)   Hypertensive emergency   Hypokalemia   Anemia   CHF, acute on chronic (HCC)   CHF (congestive heart failure) [facility])    Discharge Instructions  Discharge Instructions    Call MD for:  difficulty breathing, headache or visual disturbances    Complete by:  As directed    Call MD for:  persistant dizziness or light-headedness    Complete by:  As directed    Diet Carb Modified    Complete by:  As directed    Increase activity slowly    Complete by:  As directed        Medication List    TAKE these medications   aspirin EC 81 MG tablet Take 1 tablet (81 mg total) by mouth daily.   atorvastatin 40 MG tablet Commonly known as:  LIPITOR Take 40 mg by mouth daily.   BLOOD GLUCOSE TEST STRIPS Strp Use as directed   carvedilol 6.25 MG tablet Commonly known as:  COREG Take 6.25 mg by mouth 2 (two)  times daily with a meal.   furosemide 40 MG tablet Commonly known as:  LASIX Take 1 tablet (40 mg total) by mouth 2 (two) times daily.   insulin detemir 100 UNIT/ML injection Commonly known as:  LEVEMIR Inject 0.09 mLs (9 Units total) into the skin daily. What changed:  how much to take   Insulin Pen Needle 29G X 10MM Misc Use as directed   losartan 50 MG tablet Commonly known as:  COZAAR Take 1 tablet (50 mg total) by mouth daily. Start taking on:  05/01/2016 What changed:  medication strength  how much to take   potassium chloride SA 20 MEQ tablet Commonly known as:  K-DUR,KLOR-CON Take 1 tablet (20 mEq total) by mouth 2 (two) times daily.   sitaGLIPtin-metformin 50-1000 MG tablet Commonly known as:  JANUMET Take 1 tablet by mouth 2 (two) times daily with a meal.      Follow-up Information    [employer] [facility] Follow up in 1 week(s).   Contact information: [address] [place] [address] [phone]          Allergies  Allergen Reactions  . Ace Inhibitors Cough    Consultations:  None   Procedures/Studies: Dg Chest 2 View  Result Date: 04/26/2016 CLINICAL DATA:  Shortness of breath and asleep for couple days, swelling in legs and feet, diabetes mellitus, hypertension, hyperlipidemia, prior stroke EXAM: CHEST  2 VIEW COMPARISON:  03/09/2016 FINDINGS: Enlargement of cardiac silhouette with slight  pulmonary vascular congestion. Mediastinal contours normal. Peribronchial thickening with slightly increased perihilar markings which may represent minimal chronic failure. Mild RIGHT basilar atelectasis. No gross pleural effusion or segmental consolidation. No pneumothorax. Bones unremarkable. IMPRESSION: Enlargement of cardiac silhouette with pulmonary vascular congestion and question mild pulmonary edema. Minimal RIGHT basilar atelectasis. Electronically Signed   By: Lavonia Dana M.D.   On: 04/26/2016 11:54       Subjective: Patient is sitting in her  room and voices that she feels slightly more sluggish this morning than yesterday.  She does voice her legs are less swollen than yesterday and that this is the best they have looked.  She was able to lay flat to sleep at night.  She denies any increase work of breathing or chest pain. No abdominal pain, nausea or diarrhea.  Able to ambulate without problem.  Had questions about applying for disability; voices this would help her as she does not have insurance currently.  Discharge Exam: Vitals:   04/30/16 0908 04/30/16 1500  BP: 132/60 (!) 150/79  Pulse: 64 70  Resp:  18  Temp:  98.2 F (36.8 C)   Vitals:   04/30/16 0554 04/30/16 0800 04/30/16 0908 04/30/16 1500  BP: 138/77 (!) 184/96 132/60 (!) 150/79  Pulse: 73 77 64 70  Resp: 18   18  Temp: 98.2 F (36.8 C)   98.2 F (36.8 C)  TempSrc: Oral   Oral  SpO2: 100%   100%  Weight: 84.3 kg (185 lb 12.8 oz)     Height:        General: Pt is alert, awake, not in acute distress Cardiovascular: RRR, S1/S2 +, no rubs, no gallops Respiratory: CTA bilaterally, no wheezing, no rhonchi Abdominal: Soft, NT, ND, bowel sounds + Extremities: no edema, no cyanosis    The results of significant diagnostics from this hospitalization (including imaging, microbiology, ancillary and laboratory) are listed below for reference.     Microbiology: Recent Results (from the past 240 hour(s))  MRSA PCR Screening     Status: None   Collection Time: 04/26/16 10:30 PM  Result Value Ref Range Status   MRSA by PCR NEGATIVE NEGATIVE Final    Comment:        The GeneXpert MRSA Assay (FDA approved for NASAL specimens only), is one component of a comprehensive MRSA colonization surveillance program. It is not intended to diagnose MRSA infection nor to guide or monitor treatment for MRSA infections.      Labs: BNP (last 3 results)  Recent Labs  11/14/15 0750 03/09/16 1731 04/26/16 1322  BNP 217.0* 871.0* 017.5*   Basic Metabolic  Panel:  Recent Labs Lab 04/27/16 0428 04/27/16 1935 04/28/16 0640 04/29/16 0647 04/30/16 0559  NA 142 135 139 139 139  K 2.9* 3.6 4.0 3.5 3.7  CL 105 98* 102 102 100*  CO2 29 29 30 31  33*  GLUCOSE 97 217* 179* 151* 209*  BUN 17 16 15 16 18   CREATININE 0.79 0.89 0.88 0.80 0.85  CALCIUM 8.4* 8.4* 8.4* 8.6* 8.8*  MG  --   --  1.5* 1.9  --    Liver Function Tests:  Recent Labs Lab 04/27/16 0428  AST 40  ALT 63*  ALKPHOS 155*  BILITOT 0.6  PROT 6.0*  ALBUMIN 2.8*   No results for input(s): LIPASE, AMYLASE in the last 168 hours. No results for input(s): AMMONIA in the last 168 hours. CBC:  Recent Labs Lab 04/26/16 1322 04/27/16 0428 04/28/16 0640  WBC  5.3 5.9 6.6  NEUTROABS 2.7  --   --   HGB 10.4* 10.4* 10.8*  HCT 31.8* 32.0* 33.9*  MCV 72.3* 72.2* 73.4*  PLT 314 325 334   Cardiac Enzymes:  Recent Labs Lab 04/26/16 1322 04/26/16 2341 04/27/16 0428 04/27/16 1020  TROPONINI <0.03 0.03* 0.04* 0.03*   BNP: Invalid input(s): POCBNP CBG:  Recent Labs Lab 04/29/16 1136 04/29/16 1636 04/29/16 2115 04/30/16 0754 04/30/16 1113  GLUCAP 216* 234* 277* 191* 363*   D-Dimer No results for input(s): DDIMER in the last 72 hours. Hgb A1c No results for input(s): HGBA1C in the last 72 hours. Lipid Profile No results for input(s): CHOL, HDL, LDLCALC, TRIG, CHOLHDL, LDLDIRECT in the last 72 hours. Thyroid function studies No results for input(s): TSH, T4TOTAL, T3FREE, THYROIDAB in the last 72 hours.  Invalid input(s): FREET3 Anemia work up No results for input(s): VITAMINB12, FOLATE, FERRITIN, TIBC, IRON, RETICCTPCT in the last 72 hours. Urinalysis    Component Value Date/Time   COLORURINE YELLOW 12/05/2015 [place] 12/05/2015 1755   LABSPEC 1.030 12/05/2015 1755   PHURINE 5.5 12/05/2015 1755   GLUCOSEU 100 (A) 12/05/2015 1755   HGBUR MODERATE (A) 12/05/2015 1755   BILIRUBINUR NEGATIVE 12/05/2015 1755   KETONESUR NEGATIVE 12/05/2015  1755   PROTEINUR 100 (A) 12/05/2015 1755   UROBILINOGEN 1.0 04/12/2013 2208   NITRITE NEGATIVE 12/05/2015 1755   LEUKOCYTESUR TRACE (A) 12/05/2015 1755   Sepsis Labs Invalid input(s): PROCALCITONIN,  WBC,  LACTICIDVEN Microbiology Recent Results (from the past 240 hour(s))  MRSA PCR Screening     Status: None   Collection Time: 04/26/16 10:30 PM  Result Value Ref Range Status   MRSA by PCR NEGATIVE NEGATIVE Final    Comment:        The GeneXpert MRSA Assay (FDA approved for NASAL specimens only), is one component of a comprehensive MRSA colonization surveillance program. It is not intended to diagnose MRSA infection nor to guide or monitor treatment for MRSA infections.      Time coordinating discharge: Over 30 minutes  SIGNED:   Newman Pies, MD  [facility] 04/30/2016, 4:03 PM Pager [phone] If 7PM-7AM, please contact night-coverage [email or web address] Password [email or web address]

## 2016-04-30 NOTE — Progress Notes (Signed)
Inpatient Diabetes Program Recommendations  AACE/ADA: New Consensus Statement on Inpatient Glycemic Control (2015)  Target Ranges:  Prepandial:   less than 140 mg/dL      Peak postprandial:   less than 180 mg/dL (1-2 hours)      Critically ill patients:  140 - 180 mg/dL   Results for Amber Young, Amber Young (MRN [id number]) as of 04/30/2016 09:03  Ref. Range 04/29/2016 07:46 04/29/2016 11:36 04/29/2016 16:36 04/29/2016 21:15 04/30/2016 07:54  Glucose-Capillary Latest Ref Range: 65 - 99 mg/dL 137 (H) 216 (H) 234 (H) 277 (H) 191 (H)   Review of Glycemic Control  Current orders for Inpatient glycemic control: Levemir 8 units daily, Novolog 0-9 units TID with meals, Novolog 0-5 units QHS  Inpatient Diabetes Program Recommendations: Insulin - Meal Coverage: Post prandial glucose is consistently elevated. Please consider ordering Novolog 5 units TID with meals for meal coverage.  Thanks, Barnie Alderman, RN, MSN, CDE Diabetes Coordinator Inpatient Diabetes Program [phone] (Team Pager from [place] to [place]) [phone] (AP office) [phone] [facility] office) [phone] [facility] office)

## 2016-04-30 NOTE — Progress Notes (Signed)
Pt's IV catheter removed and intact. Pt's IV catheter clean dry and intact. Discharge instructions including medications and follow up appointments were reviewed and discussed with patient. All questions were answered and no further questions at this time. Pt verbalized understanding of discharge instructions.  Pt in stable condition and in no acute distress at time of discharge. Pt escorted by nurse tech.

## 2016-05-08 ENCOUNTER — Ambulatory Visit (INDEPENDENT_AMBULATORY_CARE_PROVIDER_SITE_OTHER): Payer: Self-pay | Admitting: Cardiovascular Disease

## 2016-05-08 ENCOUNTER — Encounter: Payer: Self-pay | Admitting: Cardiovascular Disease

## 2016-05-08 ENCOUNTER — Other Ambulatory Visit: Payer: Self-pay | Admitting: Cardiovascular Disease

## 2016-05-08 VITALS — BP 158/84 | HR 92 | Ht 66.0 in | Wt 188.0 lb

## 2016-05-08 DIAGNOSIS — I10 Essential (primary) hypertension: Secondary | ICD-10-CM

## 2016-05-08 DIAGNOSIS — I429 Cardiomyopathy, unspecified: Secondary | ICD-10-CM

## 2016-05-08 DIAGNOSIS — Z87898 Personal history of other specified conditions: Secondary | ICD-10-CM

## 2016-05-08 DIAGNOSIS — R600 Localized edema: Secondary | ICD-10-CM

## 2016-05-08 DIAGNOSIS — I739 Peripheral vascular disease, unspecified: Secondary | ICD-10-CM

## 2016-05-08 DIAGNOSIS — Z9289 Personal history of other medical treatment: Secondary | ICD-10-CM

## 2016-05-08 DIAGNOSIS — I779 Disorder of arteries and arterioles, unspecified: Secondary | ICD-10-CM

## 2016-05-08 DIAGNOSIS — I248 Other forms of acute ischemic heart disease: Secondary | ICD-10-CM

## 2016-05-08 DIAGNOSIS — I2489 Other forms of acute ischemic heart disease: Secondary | ICD-10-CM

## 2016-05-08 DIAGNOSIS — I5042 Chronic combined systolic (congestive) and diastolic (congestive) heart failure: Secondary | ICD-10-CM

## 2016-05-08 NOTE — Progress Notes (Signed)
SUBJECTIVE: Patient returns for post hospital physician follow-up for congestive heart failure. Most recent echocardiogram earlier this month showed new wall motion abnormalities with hypokinesis compared to the echo dated 11/2015. LVEF remains mildly decreased around 40%. Nuclear stress test May 2017 was without perfusion defects.  She was markedly hypertensive while hospitalized.  Discharge wt 185 lbs.  Denies chest pain. Has NYHA class II symptoms. Leg swelling has improved.  BP remains elevated.  Review of Systems: As per "subjective", otherwise negative.  Allergies  Allergen Reactions  . Ace Inhibitors Cough    Current Outpatient Prescriptions  Medication Sig Dispense Refill  . aspirin EC 81 MG tablet Take 1 tablet (81 mg total) by mouth daily. 30 tablet 0  . atorvastatin (LIPITOR) 40 MG tablet Take 40 mg by mouth daily.    . carvedilol (COREG) 6.25 MG tablet Take 6.25 mg by mouth 2 (two) times daily with a meal.    . furosemide (LASIX) 40 MG tablet Take 1 tablet (40 mg total) by mouth 2 (two) times daily. 60 tablet 3  . Glucose Blood (BLOOD GLUCOSE TEST STRIPS) STRP Use as directed 100 each 0  . insulin detemir (LEVEMIR) 100 UNIT/ML injection Inject 0.09 mLs (9 Units total) into the skin daily. 10 mL 11  . Insulin Pen Needle 29G X 10MM MISC Use as directed 100 each 0  . losartan (COZAAR) 50 MG tablet Take 1 tablet (50 mg total) by mouth daily. 30 tablet 0  . potassium chloride SA (K-DUR,KLOR-CON) 20 MEQ tablet Take 1 tablet (20 mEq total) by mouth 2 (two) times daily. 90 tablet 3  . sitaGLIPtin-metformin (JANUMET) 50-1000 MG tablet Take 1 tablet by mouth 2 (two) times daily with a meal.      No current facility-administered medications for this visit.     Past Medical History:  Diagnosis Date  . Anemia   . CHF (congestive heart failure) ([place])   . Diabetes mellitus   . GERD (gastroesophageal reflux disease)   . Hyperlipidemia   . Hypertension   . Stroke ([place])  09/2014   numbness left upper lip, finger tips on left hand    Past Surgical History:  Procedure Laterality Date  . PARTIAL HYSTERECTOMY  1999   fibroids  . TUBAL LIGATION      Social History   Social History  . Marital status: Married    Spouse name: N/A  . Number of children: N/A  . Years of education: 75   Occupational History  . hairdresser    Social History Main Topics  . Smoking status: Never Smoker  . Smokeless tobacco: Never Used  . Alcohol use No  . Drug use: No  . Sexual activity: Not Currently   Other Topics Concern  . Not on file   Social History Narrative   Married, 2 children, hairdresser   Caffeine use- occasional tea or coffee   Right handed        Vitals:   05/08/16 1618  BP: (!) 158/84  Pulse: 92  SpO2: 97%  Weight: 188 lb (85.3 kg)  Height: 5\' 6"  (1.676 m)    PHYSICAL EXAM General: NAD Neck: No JVD, no thyromegaly or thyroid nodule.  Lungs: Clear to auscultation bilaterally with normal respiratory effort. CV: Nondisplaced PMI. Regular rate and rhythm, normal S1/S2, no S3/S4, no murmur. Trace pitting pretibial edema b/l. Left carotid bruit.  Abdomen: Soft, nontender, no distention.  Skin: Intact without lesions or rashes.  Neurologic: Alert and oriented  x 3.  Psych: Normal affect. Extremities: No clubbing or cyanosis.  HEENT: Normal.     ECG: Most recent ECG reviewed.      ASSESSMENT AND PLAN: 1. Cardiomyopathy/chronic combined systolic and diastolic heart failure with bilateral leg edema: BP elevated. Will increase Coreg to 12.5 mg BID. Nuclear stress testing showed no perfusion defects consistent with prior infarction or ischemia. There was a hypertensive response to exercise. However, given new wall motion abnormalities, I will proceed with coronary angiography to evaluate for an ischemic etiology. On losartan 50 mg and Lasix 40 mg BID.  2. Malignant HTN: Elevated. Will increase Coreg to 12.5 mg BID.  3.  Demand ischemia: Nuclear stress testing showed no perfusion defects consistent with prior infarction or ischemia. There was a hypertensive response to exercise. However, given new wall motion abnormalities, I will proceed with coronary angiography to evaluate for an ischemic etiology. Continue ASA 81 mg.  4. Left carotid bruit/disease: Carotid Dopplers showed moderate left-sided plaque disease with no hemodynamically significant stenosis.  Dispo: fu after cath.  Kate Sable, M.D., F.A.C.C.

## 2016-05-08 NOTE — Addendum Note (Signed)
Addended by: Barbarann Ehlers A on: 05/08/2016 04:54 PM   Modules accepted: Orders

## 2016-05-08 NOTE — Patient Instructions (Signed)
Your physician has requested that you have a cardiac catheterization. Cardiac catheterization is used to diagnose and/or treat various heart conditions. Doctors may recommend this procedure for a number of different reasons. The most common reason is to evaluate chest pain. Chest pain can be a symptom of coronary artery disease (CAD), and cardiac catheterization can show whether plaque is narrowing or blocking your heart's arteries. This procedure is also used to evaluate the valves, as well as measure the blood flow and oxygen levels in different parts of your heart. For further information please visit [email or web address]. Please follow instruction sheet, as given.  INCREASE Coreg to 12.5 mg twice a day     Thank you for choosing [facility] !

## 2016-05-12 LAB — CBC WITH DIFFERENTIAL/PLATELET
BASOS ABS: 0 {cells}/uL (ref 0–200)
Basophils Relative: 0 %
EOS ABS: 51 {cells}/uL (ref 15–500)
EOS PCT: 1 %
HCT: 33.3 % — ABNORMAL LOW (ref 35.0–45.0)
Hemoglobin: 10.7 g/dL — ABNORMAL LOW (ref 11.7–15.5)
LYMPHS PCT: 46 %
Lymphs Abs: 2346 cells/uL (ref 850–3900)
MCH: 22.8 pg — AB (ref 27.0–33.0)
MCHC: 32.1 g/dL (ref 32.0–36.0)
MCV: 70.9 fL — AB (ref 80.0–100.0)
MONOS PCT: 11 %
MPV: 9 fL (ref 7.5–12.5)
Monocytes Absolute: 561 cells/uL (ref 200–950)
NEUTROS ABS: 2142 {cells}/uL (ref 1500–7800)
Neutrophils Relative %: 42 %
PLATELETS: 267 10*3/uL (ref 140–400)
RBC: 4.7 MIL/uL (ref 3.80–5.10)
RDW: 15.8 % — AB (ref 11.0–15.0)
WBC: 5.1 10*3/uL (ref 3.8–10.8)

## 2016-05-12 LAB — PROTIME-INR
INR: 1.1
PROTHROMBIN TIME: 11.2 s (ref 9.0–11.5)

## 2016-05-13 LAB — BASIC METABOLIC PANEL
BUN: 14 mg/dL (ref 7–25)
CALCIUM: 8.8 mg/dL (ref 8.6–10.4)
CO2: 30 mmol/L (ref 20–31)
CREATININE: 0.84 mg/dL (ref 0.50–0.99)
Chloride: 104 mmol/L (ref 98–110)
GLUCOSE: 121 mg/dL — AB (ref 65–99)
Potassium: 3.4 mmol/L — ABNORMAL LOW (ref 3.5–5.3)
Sodium: 141 mmol/L (ref 135–146)

## 2016-05-14 ENCOUNTER — Telehealth: Payer: Self-pay

## 2016-05-14 DIAGNOSIS — E876 Hypokalemia: Secondary | ICD-10-CM

## 2016-05-14 MED ORDER — POTASSIUM CHLORIDE CRYS ER 20 MEQ PO TBCR: 40.0000 meq | EXTENDED_RELEASE_TABLET | Freq: Two times a day (BID) | ORAL | 3 refills | Status: DC

## 2016-05-14 NOTE — Telephone Encounter (Signed)
Pt notified of K+ increase,will let Sonja Gunn at [facility] e[facility] know,will repeat BMET in 1 week at [facility] lab

## 2016-05-14 NOTE — Telephone Encounter (Signed)
-----   Message from Merlene Laughter, LPN sent at 46/03/320 10:10 AM EDT -----   ----- Message ----- From: Herminio Commons, MD Sent: 05/13/2016   9:57 AM To: Merlene Laughter, LPN  Increase KCl to 40 meq BID and repeat BMET in one week.

## 2016-05-18 ENCOUNTER — Encounter (HOSPITAL_COMMUNITY)
Admission: RE | Disposition: A | Discharge status: Home / Self Care | Payer: Self-pay | Source: Ambulatory Visit | Attending: Interventional Cardiology

## 2016-05-18 ENCOUNTER — Encounter (HOSPITAL_COMMUNITY): Payer: Self-pay | Admitting: Interventional Cardiology

## 2016-05-18 ENCOUNTER — Ambulatory Visit (HOSPITAL_COMMUNITY)
Admission: RE | Admit: 2016-05-18 | Discharge: 2016-05-20 | Disposition: A | Discharge status: Home / Self Care | Payer: Self-pay | Source: Ambulatory Visit | Attending: Interventional Cardiology | Admitting: Interventional Cardiology

## 2016-05-18 DIAGNOSIS — K219 Gastro-esophageal reflux disease without esophagitis: Secondary | ICD-10-CM | POA: Insufficient documentation

## 2016-05-18 DIAGNOSIS — I429 Cardiomyopathy, unspecified: Secondary | ICD-10-CM

## 2016-05-18 DIAGNOSIS — E785 Hyperlipidemia, unspecified: Secondary | ICD-10-CM | POA: Insufficient documentation

## 2016-05-18 DIAGNOSIS — I2510 Atherosclerotic heart disease of native coronary artery without angina pectoris: Secondary | ICD-10-CM

## 2016-05-18 DIAGNOSIS — Z8673 Personal history of transient ischemic attack (TIA), and cerebral infarction without residual deficits: Secondary | ICD-10-CM | POA: Insufficient documentation

## 2016-05-18 DIAGNOSIS — IMO0002 Reserved for concepts with insufficient information to code with codable children: Secondary | ICD-10-CM | POA: Diagnosis present

## 2016-05-18 DIAGNOSIS — I471 Supraventricular tachycardia: Secondary | ICD-10-CM | POA: Insufficient documentation

## 2016-05-18 DIAGNOSIS — I4891 Unspecified atrial fibrillation: Secondary | ICD-10-CM

## 2016-05-18 DIAGNOSIS — I110 Hypertensive heart disease with heart failure: Secondary | ICD-10-CM | POA: Insufficient documentation

## 2016-05-18 DIAGNOSIS — D649 Anemia, unspecified: Secondary | ICD-10-CM | POA: Insufficient documentation

## 2016-05-18 DIAGNOSIS — E1165 Type 2 diabetes mellitus with hyperglycemia: Secondary | ICD-10-CM | POA: Insufficient documentation

## 2016-05-18 DIAGNOSIS — I480 Paroxysmal atrial fibrillation: Secondary | ICD-10-CM | POA: Clinically undetermined

## 2016-05-18 DIAGNOSIS — I5043 Acute on chronic combined systolic (congestive) and diastolic (congestive) heart failure: Secondary | ICD-10-CM | POA: Insufficient documentation

## 2016-05-18 DIAGNOSIS — I255 Ischemic cardiomyopathy: Secondary | ICD-10-CM | POA: Diagnosis present

## 2016-05-18 DIAGNOSIS — M7989 Other specified soft tissue disorders: Secondary | ICD-10-CM | POA: Insufficient documentation

## 2016-05-18 DIAGNOSIS — I10 Essential (primary) hypertension: Secondary | ICD-10-CM | POA: Diagnosis present

## 2016-05-18 DIAGNOSIS — I639 Cerebral infarction, unspecified: Secondary | ICD-10-CM | POA: Insufficient documentation

## 2016-05-18 DIAGNOSIS — Z955 Presence of coronary angioplasty implant and graft: Secondary | ICD-10-CM

## 2016-05-18 DIAGNOSIS — Z794 Long term (current) use of insulin: Secondary | ICD-10-CM | POA: Insufficient documentation

## 2016-05-18 DIAGNOSIS — I97790 Other intraoperative cardiac functional disturbances during cardiac surgery: Secondary | ICD-10-CM | POA: Insufficient documentation

## 2016-05-18 DIAGNOSIS — Y840 Cardiac catheterization as the cause of abnormal reaction of the patient, or of later complication, without mention of misadventure at the time of the procedure: Secondary | ICD-10-CM | POA: Insufficient documentation

## 2016-05-18 DIAGNOSIS — Z9861 Coronary angioplasty status: Secondary | ICD-10-CM

## 2016-05-18 DIAGNOSIS — Z7982 Long term (current) use of aspirin: Secondary | ICD-10-CM | POA: Insufficient documentation

## 2016-05-18 HISTORY — DX: Cardiac murmur, unspecified: R01.1

## 2016-05-18 HISTORY — DX: Type 2 diabetes mellitus without complications: E11.9

## 2016-05-18 HISTORY — PX: CARDIAC CATHETERIZATION: SHX172

## 2016-05-18 HISTORY — DX: Atherosclerotic heart disease of native coronary artery without angina pectoris: I25.10

## 2016-05-18 LAB — BASIC METABOLIC PANEL
ANION GAP: 7 (ref 5–15)
BUN: 12 mg/dL (ref 6–20)
CALCIUM: 9 mg/dL (ref 8.9–10.3)
CO2: 27 mmol/L (ref 22–32)
CREATININE: 0.75 mg/dL (ref 0.44–1.00)
Chloride: 107 mmol/L (ref 101–111)
GFR calc non Af Amer: >60 mL/min (ref >60)
Glucose, Bld: 177 mg/dL — ABNORMAL HIGH (ref 65–99)
Potassium: 3.4 mmol/L — ABNORMAL LOW (ref 3.5–5.1)
SODIUM: 141 mmol/L (ref 135–145)

## 2016-05-18 LAB — GLUCOSE, CAPILLARY
GLUCOSE-CAPILLARY: 198 mg/dL — AB (ref 65–99)
GLUCOSE-CAPILLARY: 204 mg/dL — AB (ref 65–99)
Glucose-Capillary: 169 mg/dL — ABNORMAL HIGH (ref 65–99)
Glucose-Capillary: 221 mg/dL — ABNORMAL HIGH (ref 65–99)

## 2016-05-18 LAB — POCT ACTIVATED CLOTTING TIME
ACTIVATED CLOTTING TIME: 224 s
Activated Clotting Time: 274 seconds

## 2016-05-18 SURGERY — LEFT HEART CATH AND CORONARY ANGIOGRAPHY
Anesthesia: LOCAL

## 2016-05-18 MED ORDER — FENTANYL CITRATE (PF) 100 MCG/2ML IJ SOLN
INTRAMUSCULAR | Status: AC
  Filled 2016-05-18: qty 2

## 2016-05-18 MED ORDER — VERAPAMIL HCL 2.5 MG/ML IV SOLN
INTRAVENOUS | Status: AC
  Filled 2016-05-18: qty 2

## 2016-05-18 MED ORDER — CLOPIDOGREL BISULFATE 300 MG PO TABS
ORAL_TABLET | ORAL | Status: AC
  Filled 2016-05-18: qty 2

## 2016-05-18 MED ORDER — ATORVASTATIN CALCIUM 40 MG PO TABS
40.0000 mg | ORAL_TABLET | Freq: Every day | ORAL | Status: DC
  Administered 2016-05-18 – 2016-05-19 (×2): 40 mg via ORAL
  Filled 2016-05-18 (×2): qty 1

## 2016-05-18 MED ORDER — SODIUM CHLORIDE 0.9 % IV SOLN: 250.0000 mL | INTRAVENOUS | Status: DC | PRN

## 2016-05-18 MED ORDER — IOPAMIDOL (ISOVUE-370) INJECTION 76%
INTRAVENOUS | Status: AC
  Filled 2016-05-18: qty 50

## 2016-05-18 MED ORDER — SODIUM CHLORIDE 0.9 % WEIGHT BASED INFUSION
1.0000 mL/kg/h | INTRAVENOUS | Status: AC
  Administered 2016-05-18: 1 mL/kg/h via INTRAVENOUS

## 2016-05-18 MED ORDER — TIROFIBAN (AGGRASTAT) BOLUS VIA INFUSION
INTRAVENOUS | Status: DC | PRN
  Administered 2016-05-18: 2132.5 ug via INTRAVENOUS

## 2016-05-18 MED ORDER — FUROSEMIDE 40 MG PO TABS
40.0000 mg | ORAL_TABLET | Freq: Two times a day (BID) | ORAL | Status: DC
  Administered 2016-05-18 – 2016-05-20 (×4): 40 mg via ORAL
  Filled 2016-05-18 (×4): qty 1

## 2016-05-18 MED ORDER — ENSURE ENLIVE PO LIQD
237.0000 mL | Freq: Two times a day (BID) | ORAL | Status: DC
  Administered 2016-05-18: 16:00:00 237 mL via ORAL
  Filled 2016-05-18 (×7): qty 237

## 2016-05-18 MED ORDER — CARVEDILOL 3.125 MG PO TABS
6.2500 mg | ORAL_TABLET | Freq: Two times a day (BID) | ORAL | Status: DC
  Administered 2016-05-18 – 2016-05-19 (×2): 6.25 mg via ORAL
  Filled 2016-05-18 (×2): qty 2

## 2016-05-18 MED ORDER — IOPAMIDOL (ISOVUE-370) INJECTION 76%
INTRAVENOUS | Status: AC
  Filled 2016-05-18: qty 100

## 2016-05-18 MED ORDER — ASPIRIN EC 81 MG PO TBEC
81.0000 mg | DELAYED_RELEASE_TABLET | Freq: Every day | ORAL | Status: DC
  Administered 2016-05-19 – 2016-05-20 (×2): 81 mg via ORAL
  Filled 2016-05-18 (×2): qty 1

## 2016-05-18 MED ORDER — ONDANSETRON HCL 4 MG/2ML IJ SOLN: 4.0000 mg | Freq: Four times a day (QID) | INTRAMUSCULAR | Status: DC | PRN

## 2016-05-18 MED ORDER — HEPARIN (PORCINE) IN NACL 2-0.9 UNIT/ML-% IJ SOLN
INTRAMUSCULAR | Status: AC
  Filled 2016-05-18: qty 1500

## 2016-05-18 MED ORDER — SODIUM CHLORIDE 0.9% FLUSH: 3.0000 mL | INTRAVENOUS | Status: DC | PRN

## 2016-05-18 MED ORDER — ASPIRIN 81 MG PO CHEW
CHEWABLE_TABLET | ORAL | Status: AC
  Administered 2016-05-18: 81 mg via ORAL
  Filled 2016-05-18: qty 1

## 2016-05-18 MED ORDER — ASPIRIN 81 MG PO CHEW: 81.0000 mg | CHEWABLE_TABLET | Freq: Every day | ORAL | Status: DC

## 2016-05-18 MED ORDER — FENTANYL CITRATE (PF) 100 MCG/2ML IJ SOLN
INTRAMUSCULAR | Status: DC | PRN
  Administered 2016-05-18: 25 ug via INTRAVENOUS
  Administered 2016-05-18: 50 ug via INTRAVENOUS
  Administered 2016-05-18: 25 ug via INTRAVENOUS

## 2016-05-18 MED ORDER — TIROFIBAN HCL IN NACL 5-0.9 MG/100ML-% IV SOLN
0.1500 ug/kg/min | INTRAVENOUS | Status: AC
  Filled 2016-05-18: qty 100

## 2016-05-18 MED ORDER — VERAPAMIL HCL 2.5 MG/ML IV SOLN
INTRAVENOUS | Status: DC | PRN
  Administered 2016-05-18: 10 mL via INTRA_ARTERIAL

## 2016-05-18 MED ORDER — SODIUM CHLORIDE 0.9 % WEIGHT BASED INFUSION
3.0000 mL/kg/h | INTRAVENOUS | Status: DC
  Administered 2016-05-18: 3 mL/kg/h via INTRAVENOUS

## 2016-05-18 MED ORDER — SODIUM CHLORIDE 0.9% FLUSH: 3.0000 mL | Freq: Two times a day (BID) | INTRAVENOUS | Status: DC

## 2016-05-18 MED ORDER — SODIUM CHLORIDE 0.9% FLUSH
3.0000 mL | Freq: Two times a day (BID) | INTRAVENOUS | Status: DC
  Administered 2016-05-18 – 2016-05-19 (×4): 3 mL via INTRAVENOUS

## 2016-05-18 MED ORDER — MIDAZOLAM HCL 2 MG/2ML IJ SOLN
INTRAMUSCULAR | Status: DC | PRN
  Administered 2016-05-18: 1 mg via INTRAVENOUS
  Administered 2016-05-18: 2 mg via INTRAVENOUS
  Administered 2016-05-18: 1 mg via INTRAVENOUS

## 2016-05-18 MED ORDER — CLOPIDOGREL BISULFATE 75 MG PO TABS
75.0000 mg | ORAL_TABLET | Freq: Every day | ORAL | Status: DC
  Administered 2016-05-19: 08:00:00 75 mg via ORAL
  Filled 2016-05-18: qty 1

## 2016-05-18 MED ORDER — ACETAMINOPHEN 325 MG PO TABS
650.0000 mg | ORAL_TABLET | ORAL | Status: DC | PRN
  Administered 2016-05-18: 650 mg via ORAL
  Filled 2016-05-18: qty 2

## 2016-05-18 MED ORDER — TIROFIBAN HCL IN NACL 5-0.9 MG/100ML-% IV SOLN
INTRAVENOUS | Status: AC
  Filled 2016-05-18: qty 100

## 2016-05-18 MED ORDER — CLOPIDOGREL BISULFATE 300 MG PO TABS
ORAL_TABLET | ORAL | Status: DC | PRN
  Administered 2016-05-18: 600 mg via ORAL

## 2016-05-18 MED ORDER — LIDOCAINE HCL (PF) 1 % IJ SOLN
INTRAMUSCULAR | Status: DC | PRN
  Administered 2016-05-18: 2 mL

## 2016-05-18 MED ORDER — POTASSIUM CHLORIDE CRYS ER 20 MEQ PO TBCR
EXTENDED_RELEASE_TABLET | ORAL | Status: AC
  Filled 2016-05-18: qty 2

## 2016-05-18 MED ORDER — METOPROLOL TARTRATE 5 MG/5ML IV SOLN
2.5000 mg | Freq: Once | INTRAVENOUS | Status: AC
  Administered 2016-05-18: 20:00:00 2.5 mg via INTRAVENOUS
  Filled 2016-05-18: qty 5

## 2016-05-18 MED ORDER — NITROGLYCERIN 1 MG/10 ML FOR IR/CATH LAB
INTRA_ARTERIAL | Status: DC | PRN
  Administered 2016-05-18: 400 ug via INTRA_ARTERIAL
  Administered 2016-05-18: 200 ug via INTRACORONARY

## 2016-05-18 MED ORDER — NITROGLYCERIN 0.4 MG SL SUBL
SUBLINGUAL_TABLET | SUBLINGUAL | Status: AC
  Administered 2016-05-18: 0.4 mg
  Filled 2016-05-18: qty 1

## 2016-05-18 MED ORDER — HEPARIN (PORCINE) IN NACL 2-0.9 UNIT/ML-% IJ SOLN
INTRAMUSCULAR | Status: DC | PRN
  Administered 2016-05-18: 1500 mL

## 2016-05-18 MED ORDER — POTASSIUM CHLORIDE CRYS ER 20 MEQ PO TBCR
40.0000 meq | EXTENDED_RELEASE_TABLET | Freq: Once | ORAL | Status: AC
  Administered 2016-05-18: 40 meq via ORAL
  Filled 2016-05-18: qty 2

## 2016-05-18 MED ORDER — HEPARIN SODIUM (PORCINE) 1000 UNIT/ML IJ SOLN
INTRAMUSCULAR | Status: DC | PRN
  Administered 2016-05-18: 4000 [IU] via INTRAVENOUS
  Administered 2016-05-18: 2000 [IU] via INTRAVENOUS
  Administered 2016-05-18: 4500 [IU] via INTRAVENOUS

## 2016-05-18 MED ORDER — SODIUM CHLORIDE 0.9 % WEIGHT BASED INFUSION: 1.0000 mL/kg/h | INTRAVENOUS | Status: DC

## 2016-05-18 MED ORDER — INSULIN ASPART 100 UNIT/ML ~~LOC~~ SOLN
0.0000 [IU] | Freq: Three times a day (TID) | SUBCUTANEOUS | Status: DC
  Administered 2016-05-18: 18:00:00 5 [IU] via SUBCUTANEOUS
  Administered 2016-05-19 (×2): 2 [IU] via SUBCUTANEOUS
  Administered 2016-05-20: 13:00:00 3 [IU] via SUBCUTANEOUS

## 2016-05-18 MED ORDER — NITROGLYCERIN 1 MG/10 ML FOR IR/CATH LAB
INTRA_ARTERIAL | Status: AC
  Filled 2016-05-18: qty 10

## 2016-05-18 MED ORDER — LOSARTAN POTASSIUM 50 MG PO TABS
50.0000 mg | ORAL_TABLET | Freq: Every day | ORAL | Status: DC
  Administered 2016-05-18 – 2016-05-19 (×2): 50 mg via ORAL
  Filled 2016-05-18 (×2): qty 1

## 2016-05-18 MED ORDER — HYDRALAZINE HCL 20 MG/ML IJ SOLN
INTRAMUSCULAR | Status: AC
  Filled 2016-05-18: qty 1

## 2016-05-18 MED ORDER — MIDAZOLAM HCL 2 MG/2ML IJ SOLN
INTRAMUSCULAR | Status: AC
  Filled 2016-05-18: qty 2

## 2016-05-18 MED ORDER — TIROFIBAN HCL IV 12.5 MG/250 ML
INTRAVENOUS | Status: DC | PRN
  Administered 2016-05-18: .15 ug/kg/min via INTRAVENOUS

## 2016-05-18 MED ORDER — HEPARIN SODIUM (PORCINE) 1000 UNIT/ML IJ SOLN
INTRAMUSCULAR | Status: AC
  Filled 2016-05-18: qty 1

## 2016-05-18 MED ORDER — HYDRALAZINE HCL 20 MG/ML IJ SOLN
INTRAMUSCULAR | Status: DC | PRN
  Administered 2016-05-18 (×2): 10 mg via INTRAVENOUS

## 2016-05-18 MED ORDER — ASPIRIN 81 MG PO CHEW
81.0000 mg | CHEWABLE_TABLET | ORAL | Status: AC
  Administered 2016-05-18: 81 mg via ORAL

## 2016-05-18 MED ORDER — LIDOCAINE HCL (PF) 1 % IJ SOLN
INTRAMUSCULAR | Status: AC
  Filled 2016-05-18: qty 30

## 2016-05-18 MED ORDER — INSULIN DETEMIR 100 UNIT/ML ~~LOC~~ SOLN
9.0000 [IU] | Freq: Every day | SUBCUTANEOUS | Status: DC
  Administered 2016-05-18 – 2016-05-19 (×2): 9 [IU] via SUBCUTANEOUS
  Filled 2016-05-18 (×4): qty 0.09

## 2016-05-18 SURGICAL SUPPLY — 18 items
BALLN EMERGE MR 2.5X15 (BALLOONS) ×2
BALLN ~~LOC~~ TREK RX 3.25X12 (BALLOONS) ×2
BALLOON EMERGE MR 2.5X15 (BALLOONS) ×1 IMPLANT
BALLOON ~~LOC~~ TREK RX 3.25X12 (BALLOONS) ×1 IMPLANT
CATH 5FR JL3.5 JR4 ANG PIG MP (CATHETERS) ×2 IMPLANT
CATH LAUNCHER 6FR 3DRIGHT (CATHETERS) ×1 IMPLANT
CATHETER LAUNCHER 6FR 3DRIGHT (CATHETERS) ×2
DEVICE RAD COMP TR BAND LRG (VASCULAR PRODUCTS) ×2 IMPLANT
GLIDESHEATH SLEND SS 6F .021 (SHEATH) ×2 IMPLANT
KIT ENCORE 26 ADVANTAGE (KITS) ×2 IMPLANT
KIT HEART LEFT (KITS) ×2 IMPLANT
PACK CARDIAC CATHETERIZATION (CUSTOM PROCEDURE TRAY) ×2 IMPLANT
STENT PROMUS PREM MR 3.0X24 (Permanent Stent) ×2 IMPLANT
TRANSDUCER W/STOPCOCK (MISCELLANEOUS) ×2 IMPLANT
TUBING CIL FLEX 10 FLL-RA (TUBING) ×2 IMPLANT
VALVE GUARDIAN II ~~LOC~~ HEMO (MISCELLANEOUS) ×2 IMPLANT
WIRE SAFE-T 1.5MM-J .035X260CM (WIRE) ×2 IMPLANT
WIRE SAMURAI STR TIP 190CM (WIRE) ×2 IMPLANT

## 2016-05-18 NOTE — Progress Notes (Signed)
TR BAND REMOVAL  LOCATION:  right radial  DEFLATED PER PROTOCOL:  Yes.    TIME BAND OFF / DRESSING APPLIED:   1615   SITE UPON ARRIVAL:   Level 2  SITE AFTER BAND REMOVAL:  Level 1  CIRCULATION SENSATION AND MOVEMENT:  Within Normal Limits  Yes.    COMMENTS:  Hematoma upon arrival to unit.  Manual pressure applied for 10 minutes; coban applied until sheath removed.  Site stable.

## 2016-05-18 NOTE — Care Management Note (Addendum)
Case Management Note  Patient Details  Name: Courtlyn Aki MRN: [id number] Date of Birth: 1956/05/27  Subjective/Objective:  S/p stent intervention will be on plavix,  [facility] listed as  PCP. NCM called [facility] Dept and spoke with the pharmacist and they state the patient comes there to get medications for lasix, levimere, lipitor and janumet xr and she only pays $12.00 .  NCM asked if they had plavix, the pharmacist stated they do not carry plavix any longer patient would have to pay for that her self, but they do carry brilinta and she could get that for free with them.  NCM spoke with patient to see  If she could afford the plavix, she stated no she could not afford to pay $50.00.  NCM will put info in sticky note for MD and inform the RN. Patient has a follow up apt at the [facility] on 10/19 at 3 pm. This information about the new medication patient is on will need to be in the dc summary so that the MD at the Health Dept can follow up on it.     Action/Plan:   Expected Discharge Date:                  Expected Discharge Plan:  Home/Self Care  In-House Referral:     Discharge planning Services  CM Consult, Medication Assistance  Post Acute Care Choice:    Choice offered to:     DME Arranged:    DME Agency:     HH Arranged:    HH Agency:     Status of Service:  In process, will continue to follow  If discussed at Long Length of Stay Meetings, dates discussed:    Additional Comments:  [name], RN 05/18/2016, 2:46 PM

## 2016-05-18 NOTE — H&P (View-Only) (Signed)
SUBJECTIVE: Patient returns for post hospital physician follow-up for congestive heart failure. Most recent echocardiogram earlier this month showed new wall motion abnormalities with hypokinesis compared to the echo dated 11/2015. LVEF remains mildly decreased around 40%. Nuclear stress test May 2017 was without perfusion defects.  She was markedly hypertensive while hospitalized.  Discharge wt 185 lbs.  Denies chest pain. Has NYHA class II symptoms. Leg swelling has improved.  BP remains elevated.  Review of Systems: As per "subjective", otherwise negative.  Allergies  Allergen Reactions  . Ace Inhibitors Cough    Current Outpatient Prescriptions  Medication Sig Dispense Refill  . aspirin EC 81 MG tablet Take 1 tablet (81 mg total) by mouth daily. 30 tablet 0  . atorvastatin (LIPITOR) 40 MG tablet Take 40 mg by mouth daily.    . carvedilol (COREG) 6.25 MG tablet Take 6.25 mg by mouth 2 (two) times daily with a meal.    . furosemide (LASIX) 40 MG tablet Take 1 tablet (40 mg total) by mouth 2 (two) times daily. 60 tablet 3  . Glucose Blood (BLOOD GLUCOSE TEST STRIPS) STRP Use as directed 100 each 0  . insulin detemir (LEVEMIR) 100 UNIT/ML injection Inject 0.09 mLs (9 Units total) into the skin daily. 10 mL 11  . Insulin Pen Needle 29G X 10MM MISC Use as directed 100 each 0  . losartan (COZAAR) 50 MG tablet Take 1 tablet (50 mg total) by mouth daily. 30 tablet 0  . potassium chloride SA (K-DUR,KLOR-CON) 20 MEQ tablet Take 1 tablet (20 mEq total) by mouth 2 (two) times daily. 90 tablet 3  . sitaGLIPtin-metformin (JANUMET) 50-1000 MG tablet Take 1 tablet by mouth 2 (two) times daily with a meal.      No current facility-administered medications for this visit.     Past Medical History:  Diagnosis Date  . Anemia   . CHF (congestive heart failure) ([place])   . Diabetes mellitus   . GERD (gastroesophageal reflux disease)   . Hyperlipidemia   . Hypertension   . Stroke ([place])  09/2014   numbness left upper lip, finger tips on left hand    Past Surgical History:  Procedure Laterality Date  . PARTIAL HYSTERECTOMY  1999   fibroids  . TUBAL LIGATION      Social History   Social History  . Marital status: Married    Spouse name: N/A  . Number of children: N/A  . Years of education: 24   Occupational History  . hairdresser    Social History Main Topics  . Smoking status: Never Smoker  . Smokeless tobacco: Never Used  . Alcohol use No  . Drug use: No  . Sexual activity: Not Currently   Other Topics Concern  . Not on file   Social History Narrative   Married, 2 children, hairdresser   Caffeine use- occasional tea or coffee   Right handed        Vitals:   05/08/16 1618  BP: (!) 158/84  Pulse: 92  SpO2: 97%  Weight: 188 lb (85.3 kg)  Height: 5\' 6"  (1.676 m)    PHYSICAL EXAM General: NAD Neck: No JVD, no thyromegaly or thyroid nodule.  Lungs: Clear to auscultation bilaterally with normal respiratory effort. CV: Nondisplaced PMI. Regular rate and rhythm, normal S1/S2, no S3/S4, no murmur. Trace pitting pretibial edema b/l. Left carotid bruit.  Abdomen: Soft, nontender, no distention.  Skin: Intact without lesions or rashes.  Neurologic: Alert and oriented  x 3.  Psych: Normal affect. Extremities: No clubbing or cyanosis.  HEENT: Normal.     ECG: Most recent ECG reviewed.      ASSESSMENT AND PLAN: 1. Cardiomyopathy/chronic combined systolic and diastolic heart failure with bilateral leg edema: BP elevated. Will increase Coreg to 12.5 mg BID. Nuclear stress testing showed no perfusion defects consistent with prior infarction or ischemia. There was a hypertensive response to exercise. However, given new wall motion abnormalities, I will proceed with coronary angiography to evaluate for an ischemic etiology. On losartan 50 mg and Lasix 40 mg BID.  2. Malignant HTN: Elevated. Will increase Coreg to 12.5 mg BID.  3.  Demand ischemia: Nuclear stress testing showed no perfusion defects consistent with prior infarction or ischemia. There was a hypertensive response to exercise. However, given new wall motion abnormalities, I will proceed with coronary angiography to evaluate for an ischemic etiology. Continue ASA 81 mg.  4. Left carotid bruit/disease: Carotid Dopplers showed moderate left-sided plaque disease with no hemodynamically significant stenosis.  Dispo: fu after cath.  Kate Sable, M.D., F.A.C.C.

## 2016-05-18 NOTE — Interval H&P Note (Signed)
Cath Lab Visit (complete for each Cath Lab visit)  Clinical Evaluation Leading to the Procedure:   ACS: No.  Non-ACS:    Anginal Classification: CCS II  Anti-ischemic medical therapy: Minimal Therapy (1 class of medications)  Non-Invasive Test Results: Intermediate-risk stress test findings: cardiac mortality 1-3%/year  Prior CABG: No previous CABG      History and Physical Interval Note:  05/18/2016 9:45 AM  Amber Young  has presented today for surgery, with the diagnosis of cardiomyopathy, chf  The various methods of treatment have been discussed with the patient and family. After consideration of risks, benefits and other options for treatment, the patient has consented to  Procedure(s): Left Heart Cath and Coronary Angiography (N/A) as a surgical intervention .  The patient's history has been reviewed, patient examined, no change in status, stable for surgery.  I have reviewed the patient's chart and labs.  Questions were answered to the patient's satisfaction.     Larae Grooms

## 2016-05-19 DIAGNOSIS — I255 Ischemic cardiomyopathy: Secondary | ICD-10-CM

## 2016-05-19 DIAGNOSIS — I480 Paroxysmal atrial fibrillation: Secondary | ICD-10-CM | POA: Clinically undetermined

## 2016-05-19 DIAGNOSIS — I2510 Atherosclerotic heart disease of native coronary artery without angina pectoris: Secondary | ICD-10-CM

## 2016-05-19 DIAGNOSIS — I10 Essential (primary) hypertension: Secondary | ICD-10-CM

## 2016-05-19 DIAGNOSIS — I5041 Acute combined systolic (congestive) and diastolic (congestive) heart failure: Secondary | ICD-10-CM

## 2016-05-19 DIAGNOSIS — I4891 Unspecified atrial fibrillation: Secondary | ICD-10-CM

## 2016-05-19 DIAGNOSIS — Z9861 Coronary angioplasty status: Secondary | ICD-10-CM

## 2016-05-19 LAB — GLUCOSE, CAPILLARY
Glucose-Capillary: 146 mg/dL — ABNORMAL HIGH (ref 65–99)
Glucose-Capillary: 123 mg/dL — ABNORMAL HIGH (ref 65–99)
Glucose-Capillary: 192 mg/dL — ABNORMAL HIGH (ref 65–99)
Glucose-Capillary: 172 mg/dL — ABNORMAL HIGH (ref 65–99)

## 2016-05-19 LAB — BASIC METABOLIC PANEL
Anion gap: 10 (ref 5–15)
BUN: 12 mg/dL (ref 6–20)
CALCIUM: 8.8 mg/dL — AB (ref 8.9–10.3)
CO2: 24 mmol/L (ref 22–32)
CREATININE: 0.77 mg/dL (ref 0.44–1.00)
Chloride: 106 mmol/L (ref 101–111)
GFR calc Af Amer: >60 mL/min (ref >60)
GLUCOSE: 167 mg/dL — AB (ref 65–99)
Potassium: 3.3 mmol/L — ABNORMAL LOW (ref 3.5–5.1)
SODIUM: 140 mmol/L (ref 135–145)

## 2016-05-19 LAB — CBC
HEMATOCRIT: 32.1 % — AB (ref 36.0–46.0)
Hemoglobin: 10 g/dL — ABNORMAL LOW (ref 12.0–15.0)
MCH: 23 pg — ABNORMAL LOW (ref 26.0–34.0)
MCHC: 31.2 g/dL (ref 30.0–36.0)
MCV: 73.8 fL — ABNORMAL LOW (ref 78.0–100.0)
PLATELETS: 250 10*3/uL (ref 150–400)
RBC: 4.35 MIL/uL (ref 3.87–5.11)
RDW: 16 % — AB (ref 11.5–15.5)
WBC: 5.9 10*3/uL (ref 4.0–10.5)

## 2016-05-19 MED ORDER — TICAGRELOR 90 MG PO TABS: 90.0000 mg | ORAL_TABLET | Freq: Two times a day (BID) | ORAL | Status: DC

## 2016-05-19 MED ORDER — HYDRALAZINE HCL 20 MG/ML IJ SOLN
10.0000 mg | INTRAMUSCULAR | Status: DC | PRN
  Administered 2016-05-19 – 2016-05-20 (×3): 10 mg via INTRAVENOUS
  Filled 2016-05-19 (×4): qty 1

## 2016-05-19 MED ORDER — WARFARIN VIDEO
Freq: Once | Status: AC
  Administered 2016-05-19: 13:00:00

## 2016-05-19 MED ORDER — TICAGRELOR 90 MG PO TABS
180.0000 mg | ORAL_TABLET | Freq: Once | ORAL | Status: AC
  Administered 2016-05-19: 13:00:00 180 mg via ORAL
  Filled 2016-05-19: qty 2

## 2016-05-19 MED ORDER — TICAGRELOR 90 MG PO TABS: 180.0000 mg | ORAL_TABLET | Freq: Once | ORAL | Status: DC

## 2016-05-19 MED ORDER — CARVEDILOL 12.5 MG PO TABS
12.5000 mg | ORAL_TABLET | Freq: Two times a day (BID) | ORAL | Status: DC
  Administered 2016-05-19 – 2016-05-20 (×2): 12.5 mg via ORAL
  Filled 2016-05-19 (×2): qty 1

## 2016-05-19 MED ORDER — WARFARIN - PHARMACIST DOSING INPATIENT
Freq: Every day | Status: DC
  Administered 2016-05-19: 18:00:00

## 2016-05-19 MED ORDER — LOSARTAN POTASSIUM 50 MG PO TABS
100.0000 mg | ORAL_TABLET | Freq: Every day | ORAL | Status: DC
  Administered 2016-05-20: 100 mg via ORAL
  Filled 2016-05-19: qty 2

## 2016-05-19 MED ORDER — METOPROLOL TARTRATE 5 MG/5ML IV SOLN
2.5000 mg | Freq: Three times a day (TID) | INTRAVENOUS | Status: DC | PRN
  Administered 2016-05-19: 2.5 mg via INTRAVENOUS
  Filled 2016-05-19: qty 5

## 2016-05-19 MED ORDER — LIVING BETTER WITH HEART FAILURE BOOK
Freq: Once | Status: AC
  Administered 2016-05-19: 12:00:00

## 2016-05-19 MED ORDER — WARFARIN SODIUM 7.5 MG PO TABS
7.5000 mg | ORAL_TABLET | Freq: Once | ORAL | Status: AC
  Administered 2016-05-19: 18:00:00 7.5 mg via ORAL
  Filled 2016-05-19: qty 1

## 2016-05-19 MED ORDER — LOSARTAN POTASSIUM 50 MG PO TABS: 50.0000 mg | ORAL_TABLET | Freq: Once | ORAL | Status: DC

## 2016-05-19 MED ORDER — CARVEDILOL 3.125 MG PO TABS
6.2500 mg | ORAL_TABLET | Freq: Once | ORAL | Status: AC
  Administered 2016-05-19: 6.25 mg via ORAL
  Filled 2016-05-19: qty 2

## 2016-05-19 MED ORDER — OFF THE BEAT BOOK
Freq: Once | Status: AC
  Administered 2016-05-19: 12:00:00
  Filled 2016-05-19: qty 1

## 2016-05-19 MED ORDER — LOSARTAN POTASSIUM 50 MG PO TABS
50.0000 mg | ORAL_TABLET | Freq: Once | ORAL | Status: AC
  Administered 2016-05-19: 16:00:00 50 mg via ORAL
  Filled 2016-05-19: qty 1

## 2016-05-19 MED ORDER — TICAGRELOR 90 MG PO TABS
90.0000 mg | ORAL_TABLET | Freq: Two times a day (BID) | ORAL | Status: DC
Start: 2016-05-20 — End: 2016-05-20
  Administered 2016-05-20 (×2): 90 mg via ORAL
  Filled 2016-05-19 (×2): qty 1

## 2016-05-19 MED ORDER — ANGIOPLASTY BOOK
Freq: Once | Status: DC
  Filled 2016-05-19: qty 1

## 2016-05-19 MED ORDER — COUMADIN BOOK
Freq: Once | Status: AC
  Filled 2016-05-19: qty 1

## 2016-05-19 MED ORDER — LOSARTAN POTASSIUM 50 MG PO TABS: 100.0000 mg | ORAL_TABLET | Freq: Every day | ORAL | Status: DC

## 2016-05-19 MED ORDER — COUMADIN BOOK
Freq: Once | Status: AC
  Administered 2016-05-19: 12:00:00
  Filled 2016-05-19: qty 1

## 2016-05-19 NOTE — Progress Notes (Signed)
Inpatient Diabetes Program Recommendations  AACE/ADA: New Consensus Statement on Inpatient Glycemic Control (2015)  Target Ranges:  Prepandial:   less than 140 mg/dL      Peak postprandial:   less than 180 mg/dL (1-2 hours)      Critically ill patients:  140 - 180 mg/dL   Results for Amber Young, Amber Young (MRN [id number]) as of 05/19/2016 11:23  Ref. Range 05/18/2016 07:20 05/18/2016 12:01 05/18/2016 17:15 05/18/2016 22:06 05/19/2016 07:06  Glucose-Capillary Latest Ref Range: 65 - 99 mg/dL 198 (H) 169 (H) 204 (H) 221 (H) 146 (H)   Review of Glycemic Control  Diabetes history: DM2 Outpatient Diabetes medications: Levemir 9 units daily, Janumet 50-1000 mg BID Current orders for Inpatient glycemic control: Levemir 9 units daily at 6pm, Novolog 0-15 units TID with meals  Inpatient Diabetes Program Recommendations: Correction (SSI): Please consider ordering Novolog bedtime correction scale (0-5 units QHS). Insulin - Meal Coverage: Please consider ordering Novolog 4 units TID with meals for meal coverage if patient eats at least 50% of meal (in addition to Novolog correction).  Thanks, Barnie Alderman, RN, MSN, CDE Diabetes Coordinator Inpatient Diabetes Program [phone] (Team Pager from [place] to [place]) [phone] ([place] office) [phone] [facility] office) [phone] [facility] office)

## 2016-05-19 NOTE — Progress Notes (Signed)
ANTICOAGULATION CONSULT NOTE - Initial Consult  Pharmacy Consult for Coumadin Indication: atrial fibrillation  Allergies  Allergen Reactions  . Ace Inhibitors Cough    Patient Measurements: Height: 5\' 6"  (167.6 cm) Weight: 189 lb 9.5 oz (86 kg) IBW/kg (Calculated) : 59.3  Vital Signs: Temp: 97.7 F (36.5 C) (10/10 1111) Temp Source: Oral (10/10 1111) BP: 205/90 (10/10 1222) Pulse Rate: 72 (10/10 1222)  Labs:  Recent Labs  05/18/16 0720 05/19/16 0545  HGB  --  10.0*  HCT  --  32.1*  PLT  --  250  CREATININE 0.75 0.77    Estimated Creatinine Clearance: 82.6 mL/min (by C-G formula based on SCr of 0.77 mg/dL).   Medical History: Past Medical History:  Diagnosis Date  . Anemia   . CHF (congestive heart failure) ([place])   . Coronary artery disease   . GERD (gastroesophageal reflux disease)   . Heart murmur   . Hyperlipidemia   . Hypertension   . Stroke ([place]) 09/2014   numbness left upper lip, finger tips on left hand; "resolved" (05/18/2016)  . Type II diabetes mellitus (HCC)     Medications:  Scheduled:  . angioplasty book   Does not apply Once  . aspirin EC  81 mg Oral Daily  . atorvastatin  40 mg Oral q1800  . carvedilol  12.5 mg Oral BID WC  . feeding supplement (ENSURE ENLIVE)  237 mL Oral BID BM  . furosemide  40 mg Oral BID  . insulin aspart  0-15 Units Subcutaneous TID WC  . insulin detemir  9 Units Subcutaneous q1800  . losartan  50 mg Oral Daily  . sodium chloride flush  3 mL Intravenous Q12H  . [START ON 05/20/2016] ticagrelor  90 mg Oral BID    Assessment: 60 yo F presented 10/9 for cardiac cath with resultant DES placement.  Pt noted to have episodes of Afib and to start on Coumadin.  Coumadin points = 4.   PMH: ICM, CAD s/p PCI, new afib, CVA, HTN, DM  Goal of Therapy:  INR 2-3 Monitor platelets by anticoagulation protocol: Yes   Plan:  Coumadin 7.5mg  PO x 1 today. Recommend discharge with 5mg  daily and next INR check Friday 10/13 or  Monday 10/16. Coumadin education materials ordered.  [employer], Pharm.D., BCPS Clinical Pharmacist Pager [phone] 05/19/2016 1:10 PM

## 2016-05-19 NOTE — Progress Notes (Signed)
Pharmacist at bedside at this time educating patient on coumadin at this time. Patient states she can feel her heart fluttering at times. Harlan Stains NP notified and stated to "Give patient an hour and recheck BP, if SBP greater than 130, give additional dose of Losartan ordered and if HR is greater than 140, give Lopressor." At this time patient does not feel any fluttering or palpitation. Will continue to monitor. Patient is lying in bed. No S/S of distress noted or complaints voiced at this time.

## 2016-05-19 NOTE — Care Management Note (Signed)
Case Management Note  Patient Details  Name: Amber Young MRN: [id number] Date of Birth: 02/06/1956  Subjective/Objective:    S/p stent intervention, patient will now be on brilinta, NCM gave her the 30 day savings card, she will go to [facility] outpatient pharmacy to pick up 30 day free.  RN will fax paper script to pharmacy before patient dc.  Patient will be following up at the [facility], she has apt on 10/19 at 3 pm NCM informed her that it is important for her to make this apt.                    Action/Plan:   Expected Discharge Date:                  Expected Discharge Plan:  Home/Self Care  In-House Referral:     Discharge planning Services  CM Consult, Medication Assistance  Post Acute Care Choice:    Choice offered to:     DME Arranged:    DME Agency:     HH Arranged:    HH Agency:     Status of Service:  Completed, signed off  If discussed at [employer] of Stay Meetings, dates discussed:    Additional Comments:  [name], RN 05/19/2016, 2:01 PM

## 2016-05-19 NOTE — Progress Notes (Signed)
At approximately 1330, patient began to have Afib as high as 140's, EKG obtained and noted to be Afib with RVR at 104, patient is asymptomatic and was ambulating in room. Mancel Bale NP notified and stated that patient may have to stay another night. No further orders given. Patient's HR does go between Afib and SR in the 70's-80's and patient remains asymptomatic. Patient is looking at the Afib video at this time. No S/S of distress noted or complaints voiced at this time. Call bell is in reach.

## 2016-05-19 NOTE — Discharge Instructions (Addendum)
Information on my medicine - Coumadin   (Warfarin)  This medication education was reviewed with me or my healthcare representative as part of my discharge preparation.  The pharmacist that spoke with me during my hospital stay was:  Horton Chin, PharmD, BCPS  Why was Coumadin prescribed for you? Coumadin was prescribed for you because you have a blood clot or a medical condition that can cause an increased risk of forming blood clots. Blood clots can cause serious health problems by blocking the flow of blood to the heart, lung, or brain. Coumadin can prevent harmful blood clots from forming. As a reminder your indication for Coumadin is:   Stroke Prevention Because Of Atrial Fibrillation  What test will check on my response to Coumadin? While on Coumadin (warfarin) you will need to have an INR test regularly to ensure that your dose is keeping you in the desired range. The INR (international normalized ratio) number is calculated from the result of the laboratory test called prothrombin time (PT).  If an INR APPOINTMENT HAS NOT ALREADY BEEN MADE FOR YOU please schedule an appointment to have this lab work done by your health care provider within 7 days. Your INR goal is usually a number between:  2 to 3 or your provider may give you a more narrow range like 2-2.5.  Ask your health care provider during an office visit what your goal INR is.  What  do you need to  know  About  COUMADIN? Take Coumadin (warfarin) exactly as prescribed by your healthcare provider about the same time each day.  DO NOT stop taking without talking to the doctor who prescribed the medication.  Stopping without other blood clot prevention medication to take the place of Coumadin may increase your risk of developing a new clot or stroke.  Get refills before you run out.  What do you do if you miss a dose? If you miss a dose, take it as soon as you remember on the same day then continue your regularly scheduled regimen  the next day.  Do not take two doses of Coumadin at the same time.  Important Safety Information A possible side effect of Coumadin (Warfarin) is an increased risk of bleeding. You should call your healthcare provider right away if you experience any of the following: ? Bleeding from an injury or your nose that does not stop. ? Unusual colored urine (red or dark brown) or unusual colored stools (red or black). ? Unusual bruising for unknown reasons. ? A serious fall or if you hit your head (even if there is no bleeding).  Some foods or medicines interact with Coumadin (warfarin) and might alter your response to warfarin. To help avoid this: ? Eat a balanced diet, maintaining a consistent amount of Vitamin K. ? Notify your provider about major diet changes you plan to make. ? Avoid alcohol or limit your intake to 1 drink for women and 2 drinks for men per day. (1 drink is 5 oz. wine, 12 oz. beer, or 1.5 oz. liquor.)  Make sure that ANY health care provider who prescribes medication for you knows that you are taking Coumadin (warfarin).  Also make sure the healthcare provider who is monitoring your Coumadin knows when you have started a new medication including herbals and non-prescription products.  Coumadin (Warfarin)  Major Drug Interactions  Increased Warfarin Effect Decreased Warfarin Effect  Alcohol (large quantities) Antibiotics (esp. Septra/Bactrim, Flagyl, Cipro) Amiodarone (Cordarone) Aspirin (ASA) Cimetidine (Tagamet) Megestrol (Megace) NSAIDs (ibuprofen,  naproxen, etc.) °Piroxicam (Feldene) °Propafenone (Rythmol SR) °Propranolol (Inderal) °Isoniazid (INH) °Posaconazole (Noxafil) Barbiturates (Phenobarbital) °Carbamazepine (Tegretol) °Chlordiazepoxide (Librium) °Cholestyramine (Questran) °Griseofulvin °Oral Contraceptives °Rifampin °Sucralfate (Carafate) °Vitamin K  ° °Coumadin® (Warfarin) Major Herbal Interactions  °Increased Warfarin Effect Decreased Warfarin Effect   °Garlic °Ginseng °Ginkgo biloba Coenzyme Q10 °Green tea °St. John’s wort   ° °Coumadin® (Warfarin) FOOD Interactions  °Eat a consistent number of servings per week of foods HIGH in Vitamin K °(1 serving = ½ cup)  °Collards (cooked, or boiled & drained) °Kale (cooked, or boiled & drained) °Mustard greens (cooked, or boiled & drained) °Parsley *serving size only = ¼ cup °Spinach (cooked, or boiled & drained) °Swiss chard (cooked, or boiled & drained) °Turnip greens (cooked, or boiled & drained)  °Eat a consistent number of servings per week of foods MEDIUM-HIGH in Vitamin K °(1 serving = 1 cup)  °Asparagus (cooked, or boiled & drained) °Broccoli (cooked, boiled & drained, or raw & chopped) °Brussel sprouts (cooked, or boiled & drained) *serving size only = ½ cup °Lettuce, raw (green leaf, endive, romaine) °Spinach, raw °Turnip greens, raw & chopped  ° °These websites have more information on Coumadin (warfarin):  www.coumadin.com; °www.ahrq.gov/consumer/coumadin.htm; ° ° ° °

## 2016-05-19 NOTE — Progress Notes (Addendum)
Patient Name: Amber Young Date of Encounter: 05/19/2016  Primary Cardiologist: Alain Marion Problem List     Principal Problem:   Cardiomyopathy, ischemic Active Problems:   Acute combined systolic and diastolic CHF, NYHA class 1 (HCC)   CAD S/P PCI mLAD DES (Promus 3.0 x 24 -- 3.25 mm)   New onset atrial fibrillation (HCC)   Acute CVA (cerebrovascular accident) ([place])   Uncontrolled hypertension   Uncontrolled diabetes mellitus ([place])    Subjective   Feels great this morning. Inflated without any difficulty. No headache blurred vision or dyspnea. She does not sleep very well however earlier on this morning noted her heart racing "all over the place " -- she is said that these episodes have occurred intermittently off and on in the past few months.  -- Noted on telemetry to have a roughly 30 minute run of atrial fibrillation with frequent episodes of PAT.  Inpatient Medications    . angioplasty book   Does not apply Once  . aspirin EC  81 mg Oral Daily  . atorvastatin  40 mg Oral q1800  . carvedilol  6.25 mg Oral BID WC  . clopidogrel  75 mg Oral Q breakfast  . feeding supplement (ENSURE ENLIVE)  237 mL Oral BID BM  . furosemide  40 mg Oral BID  . insulin aspart  0-15 Units Subcutaneous TID WC  . insulin detemir  9 Units Subcutaneous q1800  . losartan  50 mg Oral Daily  . sodium chloride flush  3 mL Intravenous Q12H    Vital Signs    Vitals:   05/18/16 1830 05/18/16 1900 05/18/16 1917 05/19/16 0705  BP: (!) 191/80 (!) 155/61 (!) 155/61 (!) 191/90  Pulse: 75 73 79 (!) 110  Resp: (!) 26 16 18  (!) 24  Temp:   98.1 F (36.7 C) 98.5 F (36.9 C)  TempSrc:   Oral Oral  SpO2: 100% 100% 100% 100%  Weight:    86 kg (189 lb 9.5 oz)  Height:        Intake/Output Summary (Last 24 hours) at 05/19/16 0833 Last data filed at 05/19/16 0756  Gross per 24 hour  Intake              823 ml  Output             1200 ml  Net             -377 ml   Filed  Weights   05/18/16 0716 05/19/16 0705  Weight: 85.3 kg (188 lb) 86 kg (189 lb 9.5 oz)    Physical Exam   GEN: Well nourished, well developed, in no acute distress.  HEENT: Grossly normal.  Neck: Supple, no JVD, carotid bruits, or masses. Cardiac: RRR, no murmurs, rubs, or gallops. No clubbing, cyanosis, edema.  Radials/DP/PT 2+ and equal bilaterally.  Respiratory:  Respirations regular and unlabored, clear to auscultation bilaterally. GI: Soft, nontender, nondistended, BS + x 4. MS: no deformity or atrophy. Right radial cath site intact. Neuro:  Strength and sensation are intact. Psych: AAOx3.  Normal affect.  Labs    CBC  Recent Labs  05/19/16 0545  WBC 5.9  HGB 10.0*  HCT 32.1*  MCV 73.8*  PLT 867   Basic Metabolic Panel  Recent Labs  05/18/16 0720 05/19/16 0545  NA 141 140  K 3.4* 3.3*  CL 107 106  CO2 27 24  GLUCOSE 177* 167*  BUN 12 12  CREATININE 0.75 0.77  CALCIUM 9.0 8.8*   Liver Function Tests No results for input(s): AST, ALT, ALKPHOS, BILITOT, PROT, ALBUMIN in the last 72 hours. No results for input(s): LIPASE, AMYLASE in the last 72 hours. Cardiac Enzymes No results for input(s): CKTOTAL, CKMB, CKMBINDEX, TROPONINI in the last 72 hours. BNP Invalid input(s): POCBNP D-Dimer No results for input(s): DDIMER in the last 72 hours. Hemoglobin A1C No results for input(s): HGBA1C in the last 72 hours. Fasting Lipid Panel No results for input(s): CHOL, HDL, LDLCALC, TRIG, CHOLHDL, LDLDIRECT in the last 72 hours. Thyroid Function Tests No results for input(s): TSH, T4TOTAL, T3FREE, THYROIDAB in the last 72 hours.  Invalid input(s): FREET3  Telemetry    Sinus rhythm with short runs of PAT but also noted 20-30 minute run followed by a roughly 15-20 minute run of atrial fibrillation with RVR.  ECG    This rhythm with multiple PACs and a PAT pattern, rate 97 BPM. Anterior infarct, age undetermined.  Cardiology     CATH 10/9:   Mid LAD-2  lesion, 80 %stenosed. A STENT PROMUS PREM MR 3.0X24 drug eluting stent was successfully placed, and overlaps previously placed stent, postdilated to 3.25 mm.  Post intervention, there is a 0% residual stenosis.  Prox RCA lesion, 50 %stenosed. Some catheter dampening noted at times but this may have been related to catheter induced spasm based on the angiogram.  Ost RCA lesion, 50 %stenosed.  Ost 2nd Diag to 2nd Diag lesion, 80 %stenosed.  Mid LAD-1 lesion, 25 %stenosed.  1st Diag lesion, 25 %stenosed.  There is mild left ventricular systolic dysfunction.The left ventricular ejection fraction is 40-45% by visual estimate.  LV end diastolic pressure is moderately elevated.   Dual antiplatelet therapy for 12 months.  Continue aggressive secondary prevention.  During the procedure, the patient had paroxysmal SVT episodes. Will watch overnight to monitor her on telemetry.  Patient Profile     60 year old woman with history of cardiac myopathy and combined systolic and diastolic heart failure with no prior diagnosis of coronary disease who was referred for coronary angiography and found to have an LAD lesion now treated with DES stent.  Assessment & Plan       Cardiomyopathy, ischemic combined systolic and diastolic CHF, NYHA class 1 ([place])- By cath Yesterday. EDP was 24  On carvedilol 6.25 mg twice a day and losartan 50 mg daily.  Currently on Lasix 40 mg twice a day -      CAD S/P PCI mLAD DES (Promus 3.0 x 24 -- 3.25 mm) - LAD PCI. On aspirin plus Plavix; on statin, beta blocker and ARB. -> Will convert from Plavix to Brilinta based on financial concerns. [place] will be able to help with Brilinta, but not Plavix.  Will give loading dose of Brilinta was afternoon and stop Plavix.    New onset atrial fibrillation (HCC) -- Not previously diagnosed  This patients CHA2DS2-VASc Score and unadjusted Ischemic Stroke Rate (% per year) is equal to 11.2 % stroke rate/year from a  score of 7 Above score calculated as 1 point each if present [CHF, HTN, DM, Vascular=MI/PAD/Aortic Plaque, Age if 50-74, or Female]; Above score calculated as 2 points each if present [Age > 75, or Stroke/TIA/TE]  As she is now on Brilinta, will start warfarin without bridging.  Stop aspirin after one month  Increased beta blocker dose for better rate control     Acute CVA (cerebrovascular accident) ([place])    Uncontrolled hypertension - on carvedilol and lisinopril. Blood  pressure currently very highed to reassess after medications given today. -- We will increase dose of carvedilol for better rate control and blood pressure control.    Uncontrolled diabetes mellitus ([place]) - currently on insulin.   Clinically, she appears to be stable and ready for discharge. I'm a little bit concerned with her poorly controlled hypertension this morning. I also spent about 20 minutes with her talking about new diagnosis of atrial fibrillation and treatments going forward.  I would like for her to have warfarin counseling from pharmacist. She will need to have close follow-up of INRs along with blood pressure in the [place] clinic.  Provider blood pressure is more stable this afternoon and she has talked with pharmacist to start warfarin prior to discharge, I think she should be okay for discharge.  Total time with patient was 40 minutes. Greater than 50% of which was in direct counseling.    Signed, Glenetta Hew, M.D., M.S. Interventional Cardiologist   Pager # [phone] Phone # [phone] [address] [place], [place] [address]  05/19/2016, 8:33 AM    ADDENDUM: BP STILL POORLY CONTROLLED & HAVING RUNS OF AFIB RVR STILL  WILL KEEP OVERNIGHT & TITRATE BB & ARB DOSE UP; PRN IV LOPRESSOR.   WOULD CHANGE LOAD DOSE OF BRILINTA TO TOMORROW AM.   Glenetta Hew, MD

## 2016-05-19 NOTE — Progress Notes (Signed)
Patient did get some shortness of breath with the Brilinta, gave some coke and patient states she felt better, no more shortness of breath and no palpitation voiced. No S/S of distress noted or complaints voiced at this time. Call bell is in reach and bed is in lowest position.

## 2016-05-19 NOTE — Progress Notes (Signed)
CARDIAC REHAB PHASE I   PRE:  Rate/Rhythm: 89 SR  BP:  Supine:   Sitting: 186/114, 190/100 dinamapp    170/100 manual  Standing:    SaO2:   MODE:  Ambulation: 800 ft   POST:  Rate/Rhythm: 95 SR  BP:  Supine:   Sitting: 170/90 manual  Standing:    SaO2:  1015-1138 Pt walked 800 ft with steady gait. No CP. Tolerated well. BP elevated but better if taken manually. Discussed the importance of brilinta with stent, NTG use, risk factors, ex ed, and watching sodium, carbs and eating more heart healthy. Pt may be started on Coumadin and would then have to watch Vitamin K in foods. Encouraged her to attend CRP 2 in [place] so that she can receive more dietary help. She will have a lot to manage with watching all of these. Gave her CHf booklet and reviewed the zones and encouraged 2 L FR , 2000 mg sodium restriction and daily weights. Pt given heart healthy, diabetic and low sodium diets. Will refer to [place] CRP 2. Asked RN to order Off the Beat booklet for pt.   Graylon Good, RN BSN  05/19/2016 11:31 AM

## 2016-05-20 DIAGNOSIS — Z955 Presence of coronary angioplasty implant and graft: Secondary | ICD-10-CM

## 2016-05-20 LAB — GLUCOSE, CAPILLARY
GLUCOSE-CAPILLARY: 110 mg/dL — AB (ref 65–99)
GLUCOSE-CAPILLARY: 197 mg/dL — AB (ref 65–99)

## 2016-05-20 LAB — PROTIME-INR
INR: 1.17
PROTHROMBIN TIME: 15 s (ref 11.4–15.2)

## 2016-05-20 MED ORDER — CARVEDILOL 12.5 MG PO TABS: 12.5000 mg | ORAL_TABLET | Freq: Two times a day (BID) | ORAL | 12 refills | Status: DC

## 2016-05-20 MED ORDER — TICAGRELOR 90 MG PO TABS: 90.0000 mg | ORAL_TABLET | Freq: Two times a day (BID) | ORAL | 12 refills | Status: DC

## 2016-05-20 MED ORDER — WARFARIN SODIUM 7.5 MG PO TABS: 7.5000 mg | ORAL_TABLET | Freq: Once | ORAL | Status: DC

## 2016-05-20 MED ORDER — LOSARTAN POTASSIUM 100 MG PO TABS: 100.0000 mg | ORAL_TABLET | Freq: Every day | ORAL | 12 refills | Status: DC

## 2016-05-20 MED ORDER — WARFARIN SODIUM 5 MG PO TABS: 5.0000 mg | ORAL_TABLET | Freq: Every day | ORAL | 3 refills | Status: DC

## 2016-05-20 MED FILL — BRILINTA 90 MG TABLET: 90 | 30 days supply | Qty: 60 | Fill #0

## 2016-05-20 NOTE — Discharge Summary (Signed)
Discharge Summary    Patient ID: Amber Young,  MRN: [id number], DOB/AGE: May 04, 1956 60 y.o.  Admit date: 05/18/2016 Discharge date: 05/20/2016  Primary Care Provider: [place] [facility] Primary Cardiologist: Dr. Bronson Ing  Discharge Diagnoses    Principal Problem:   Cardiomyopathy, ischemic Active Problems:   Acute CVA (cerebrovascular accident) [facility])   Uncontrolled hypertension   Uncontrolled diabetes mellitus ([place])   Acute combined systolic and diastolic CHF, NYHA class 1 (HCC)   CAD S/P PCI mLAD DES (Promus 3.0 x 24 -- 3.25 mm)   New onset atrial fibrillation (HCC)   Allergies Allergies  Allergen Reactions  . Ace Inhibitors Cough    Diagnostic Studies/Procedures   Coronary Stent Intervention  Left Heart Cath and Coronary Angiography 05/18/16    Prox RCA lesion, 50 %stenosed. Some catheter dampening noted at times but this may have been related to catheter induced spasm based on the angiogram.  Ost RCA lesion, 50 %stenosed.  Ost 2nd Diag to 2nd Diag lesion, 80 %stenosed.  Mid LAD-1 lesion, 25 %stenosed.  1st Diag lesion, 25 %stenosed.  Mid LAD-2 lesion, 80 %stenosed. A STENT PROMUS PREM MR 3.0X24 drug eluting stent was successfully placed, and overlaps previously placed stent, postdilated to 3.25 mm.  Post intervention, there is a 0% residual stenosis.  The left ventricular ejection fraction is 40-45% by visual estimate.  There is mild left ventricular systolic dysfunction.  LV end diastolic pressure is moderately elevated.   Dual antiplatelet therapy for 12 months.  Continue aggressive secondary prevention.  During the procedure, the patient had paroxysmal SVT episodes. Will watch overnight to monitor her on telemetry   _____________   History of Present Illness   Amber Young is a 60 year old female with a past medical history of HTN, HLD, CVA,DM, chronic systolic CHF (EF 91%). She was seen by Dr. Bronson Ing in May of this year after  being hospitalized with uncontrolled hypertension. She noted that she had SOB with walking up stairs and was feeling more fatigued than usual. She was referred for exercise Myoview.   Nuclear stress test showed no perfusion defects. She was again hospitalized in September with HTN and CHF exacerbation. Echo during that admission showed new wall motion abnormalities with hypokinesis of the mid anterior, basal-mid anteroseptal and mid anterolateral myocardium. When she saw Dr. Bronson Ing on 05/08/16 it was felt that she would benefit from heart catheterization to evaluate for ischemia.   Hospital Course     Ms. [name] had heart cath on 05/18/16. Report above. She had a mid LAD lesion that was 80% stenosed that was successfully treated with a DES. She had some episodes of SVT during the procedure so she was admitted overnight for observation. Her lVEDP was 26 at the time of cath.   She felt some palpitations and was noted to have a 30 minute run of atrial fibrillation with frequent bursts of PAT.   Given her new onset atrial fibrillation and her high CHA2DS2-VASc score of 7, she was started on warfarin. She will be on triple therapy with ASA+ Brilinta + Warfarin for one month, then will remain on Brilinta + Warfarin only. She was switched to Brilinta from Plavix as she was able to get the Brilinta from the health department and not Plavix.   Her Coreg was increased to 12.5mg  BID for better rate control. We will continue her ARB for her ischemic cardiomyopathy and hypertensive heart disease.   She was counseled by pharmacy about warfarin use.  She was feeling well the morning of discharge, she denied chest pain and palpitations. I spent about 15 minutes with her discussing her medications and answering her questions. She verbalized understanding and was feeling very positive.   She was seen today by Dr. Ellyn Hack and deemed suitable for discharge. She will be seen in Dr. Court Joy office next week and  will need to have her INR checked on Friday.  _____________  Discharge Vitals Blood pressure (!) 186/84, pulse 81, temperature 98.1 F (36.7 C), temperature source Oral, resp. rate (!) 27, height 5\' 6"  (1.676 m), weight 191 lb 12.8 oz (87 kg), SpO2 98 %.  Filed Weights   05/18/16 0716 05/19/16 0705 05/20/16 0625  Weight: 188 lb (85.3 kg) 189 lb 9.5 oz (86 kg) 191 lb 12.8 oz (87 kg)   Physical Exam   GEN: Well nourished, well developed, in no acute distress.  HEENT: Grossly normal.  Neck: Supple, no JVD, carotid bruits, or masses. Cardiac: RRR, no murmurs, rubs, or gallops. No clubbing, cyanosis, edema.  Radials/DP/PT 2+ and equal bilaterally.  Respiratory:  Respirations regular and unlabored, clear to auscultation bilaterally. GI: Soft, nontender, nondistended, BS + x 4. MS: no deformity or atrophy. Right radial cath site intact. Neuro:  Strength and sensation are intact. Psych: AAOx3.  Normal affect.   Labs & Radiologic Studies     CBC  Recent Labs  05/19/16 0545  WBC 5.9  HGB 10.0*  HCT 32.1*  MCV 73.8*  PLT 643   Basic Metabolic Panel  Recent Labs  05/18/16 0720 05/19/16 0545  NA 141 140  K 3.4* 3.3*  CL 107 106  CO2 27 24  GLUCOSE 177* 167*  BUN 12 12  CREATININE 0.75 0.77  CALCIUM 9.0 8.8*    Dg Chest 2 View  Result Date: 04/26/2016 CLINICAL DATA:  Shortness of breath and asleep for couple days, swelling in legs and feet, diabetes mellitus, hypertension, hyperlipidemia, prior stroke EXAM: CHEST  2 VIEW COMPARISON:  03/09/2016 FINDINGS: Enlargement of cardiac silhouette with slight pulmonary vascular congestion. Mediastinal contours normal. Peribronchial thickening with slightly increased perihilar markings which may represent minimal chronic failure. Mild RIGHT basilar atelectasis. No gross pleural effusion or segmental consolidation. No pneumothorax. Bones unremarkable. IMPRESSION: Enlargement of cardiac silhouette with pulmonary vascular congestion and  question mild pulmonary edema. Minimal RIGHT basilar atelectasis. Electronically Signed   By: Lavonia Dana M.D.   On: 04/26/2016 11:54    Disposition   Pt is being discharged home today in good condition.  Follow-up Plans & Appointments    Follow-up Information    Jory Sims, NP Follow up on 05/29/2016.   Specialties:  Nurse Practitioner, Radiology, Cardiology Why:  at 2:30pm for hospital follow up with Dr. Court Joy NP.  Contact information: [place] [address] [phone]        [facility] Follow up on 05/25/2016.   Specialty:  Cardiology Why:  at 10:00 am to have INR (Coumadin level) checked.  Contact information: [place] [phone]         Discharge Instructions    Amb Referral to Cardiac Rehabilitation    Complete by:  As directed    Diagnosis:  Coronary Stents      Discharge Medications   Current Discharge Medication List    START taking these medications   Details  ticagrelor (BRILINTA) 90 MG TABS tablet Take 1 tablet (90 mg total) by mouth 2 (two) times daily. Qty:  60 tablet, Refills: 12    warfarin (COUMADIN) 5 MG tablet Take 1 tablet (5 mg total) by mouth daily at 6 PM. Qty: 30 tablet, Refills: 3      CONTINUE these medications which have CHANGED   Details  carvedilol (COREG) 12.5 MG tablet Take 1 tablet (12.5 mg total) by mouth 2 (two) times daily with a meal. Qty: 60 tablet, Refills: 12    losartan (COZAAR) 100 MG tablet Take 1 tablet (100 mg total) by mouth daily. Qty: 30 tablet, Refills: 12      CONTINUE these medications which have NOT CHANGED   Details  aspirin EC 81 MG tablet Take 1 tablet (81 mg total) by mouth daily. Qty: 30 tablet, Refills: 0    atorvastatin (LIPITOR) 40 MG tablet Take 40 mg by mouth daily.    furosemide (LASIX) 40 MG tablet Take 1 tablet (40 mg total) by mouth 2 (two) times daily. Qty: 60 tablet, Refills: 3    insulin detemir  (LEVEMIR) 100 UNIT/ML injection Inject 0.09 mLs (9 Units total) into the skin daily. Qty: 10 mL, Refills: 11    potassium chloride SA (K-DUR,KLOR-CON) 20 MEQ tablet Take 2 tablets (40 mEq total) by mouth 2 (two) times daily. Qty: 180 tablet, Refills: 3    sitaGLIPtin-metformin (JANUMET) 50-1000 MG tablet Take 1 tablet by mouth 2 (two) times daily with a meal.     Glucose Blood (BLOOD GLUCOSE TEST STRIPS) STRP Use as directed Qty: 100 each, Refills: 0    Insulin Pen Needle 29G X 10MM MISC Use as directed Qty: 100 each, Refills: 0         Aspirin prescribed at discharge?  Yes High Intensity Statin Prescribed? (Lipitor 40-80mg  or Crestor 20-40mg ): Yes Beta Blocker Prescribed? Yes For EF 40% or less, Was ACEI/ARB Prescribed? Yes ADP Receptor Inhibitor Prescribed? (i.e. Plavix etc.-Includes Medically Managed Patients): Yes For EF <40%, Aldosterone Inhibitor Prescribed? Yes Was EF assessed during THIS hospitalization? No: EF was assessed last month Was Cardiac Rehab II ordered? (Included Medically managed Patients): Yes   Outstanding Labs/Studies   INR check on Mon. 10/16   Duration of Discharge Encounter   Greater than 30 minutes including physician time.  Signed, Arbutus Leas NP 05/20/2016, 8:22 AM

## 2016-05-20 NOTE — Progress Notes (Signed)
[place] for Coumadin Indication: atrial fibrillation  Allergies  Allergen Reactions  . Ace Inhibitors Cough    Patient Measurements: Height: 5\' 6"  (167.6 cm) Weight: 191 lb 12.8 oz (87 kg) IBW/kg (Calculated) : 59.3  Vital Signs: Temp: 98.1 F (36.7 C) (10/11 0843) Temp Source: Oral (10/11 0843) BP: 182/89 (10/11 1315) Pulse Rate: 119 (10/11 0900)  Labs:  Recent Labs  05/18/16 0720 05/19/16 0545 05/20/16 0222  HGB  --  10.0*  --   HCT  --  32.1*  --   PLT  --  250  --   LABPROT  --   --  15.0  INR  --   --  1.17  CREATININE 0.75 0.77  --     Estimated Creatinine Clearance: 83.1 mL/min (by C-G formula based on SCr of 0.77 mg/dL).   Medical History: Past Medical History:  Diagnosis Date  . Anemia   . CHF (congestive heart failure) ([place])   . Coronary artery disease   . GERD (gastroesophageal reflux disease)   . Heart murmur   . Hyperlipidemia   . Hypertension   . Stroke ([place]) 09/2014   numbness left upper lip, finger tips on left hand; "resolved" (05/18/2016)  . Type II diabetes mellitus (HCC)     Medications:  Scheduled:  . angioplasty book   Does not apply Once  . aspirin EC  81 mg Oral Daily  . atorvastatin  40 mg Oral q1800  . carvedilol  12.5 mg Oral BID WC  . feeding supplement (ENSURE ENLIVE)  237 mL Oral BID BM  . furosemide  40 mg Oral BID  . insulin aspart  0-15 Units Subcutaneous TID WC  . insulin detemir  9 Units Subcutaneous q1800  . losartan  100 mg Oral Daily  . sodium chloride flush  3 mL Intravenous Q12H  . ticagrelor  90 mg Oral BID  . warfarin  7.5 mg Oral ONCE-1800  . Warfarin - Pharmacist Dosing Inpatient   Does not apply q1800    Assessment: 60 yo F presented 10/9 for cardiac cath with resultant DES placement.  Pt noted to have episodes of Afib and to start on Coumadin. INR 1.1.  PMH: ICM, CAD s/p PCI, new afib, CVA, HTN, DM  Goal of Therapy:  INR 2-3 Monitor platelets by  anticoagulation protocol: Yes   Plan:  Coumadin 7.5 mg po x1 Daily INR Would discharge on warfarin 5 mg po daily and check INR on Friday  Harvel Quale 05/20/2016 3:39 PM

## 2016-05-20 NOTE — Progress Notes (Signed)
CARDIAC REHAB PHASE I   PRE:  Rate/Rhythm: 77 SR  BP:  Sitting: 145/61        SaO2: 99 RA  MODE:  Ambulation: 800 ft   POST:  Rate/Rhythm: 140 a fib, 77 SR c/ PACs after 5 minutes rest  BP:  Sitting: 168/97 automatic, 138/68 manual         SaO2: 98 RA  Pt ambulated 800 ft on RA, independent, steady gait, tolerated well with no complaints. Pt did convert to a fib 140s during walk, denies any associated symptoms, returned to NSR with PACs upon return to room, after 5 minutes of rest. Reviewed yesterday's education, pt able to perform teach back. Pt states she has no questions at this time. Pt to bed per pt request after walk, call bell within reach.  [place], RN, BSN 05/20/2016 9:12 AM

## 2016-05-21 ENCOUNTER — Telehealth: Payer: Self-pay | Admitting: *Deleted

## 2016-05-21 NOTE — Telephone Encounter (Signed)
Patient contacted regarding discharge from [facility] on 05/20/16.  Patient understands to follow up with provider Jory Sims, NP. on 05/29/16 at 2:30 at [facility]. Patient understands discharge instructions? Yes Patient understands medications and regiment? Yes Patient understands to bring all medications to this visit? Yes

## 2016-05-25 ENCOUNTER — Encounter: Payer: Self-pay | Admitting: *Deleted

## 2016-05-29 ENCOUNTER — Ambulatory Visit (INDEPENDENT_AMBULATORY_CARE_PROVIDER_SITE_OTHER): Payer: Self-pay | Admitting: Adult Health

## 2016-05-29 ENCOUNTER — Ambulatory Visit (INDEPENDENT_AMBULATORY_CARE_PROVIDER_SITE_OTHER): Payer: Self-pay | Admitting: Cardiovascular Disease

## 2016-05-29 ENCOUNTER — Encounter: Payer: Self-pay | Admitting: Adult Health

## 2016-05-29 VITALS — BP 136/80 | HR 78 | Ht 66.5 in | Wt 186.0 lb

## 2016-05-29 DIAGNOSIS — I214 Non-ST elevation (NSTEMI) myocardial infarction: Secondary | ICD-10-CM

## 2016-05-29 DIAGNOSIS — IMO0001 Reserved for inherently not codable concepts without codable children: Secondary | ICD-10-CM

## 2016-05-29 DIAGNOSIS — I4891 Unspecified atrial fibrillation: Secondary | ICD-10-CM

## 2016-05-29 DIAGNOSIS — I639 Cerebral infarction, unspecified: Secondary | ICD-10-CM

## 2016-05-29 DIAGNOSIS — I420 Dilated cardiomyopathy: Secondary | ICD-10-CM

## 2016-05-29 DIAGNOSIS — Z5181 Encounter for therapeutic drug level monitoring: Secondary | ICD-10-CM

## 2016-05-29 DIAGNOSIS — I2510 Atherosclerotic heart disease of native coronary artery without angina pectoris: Secondary | ICD-10-CM

## 2016-05-29 DIAGNOSIS — Z79899 Other long term (current) drug therapy: Secondary | ICD-10-CM

## 2016-05-29 DIAGNOSIS — E119 Type 2 diabetes mellitus without complications: Secondary | ICD-10-CM

## 2016-05-29 DIAGNOSIS — Z794 Long term (current) use of insulin: Secondary | ICD-10-CM

## 2016-05-29 LAB — POCT INR: INR: 4.1

## 2016-05-29 NOTE — Progress Notes (Signed)
Name: Amber Young    DOB: December 02, 1955  Age: 60 y.o.  MR#: [id number]       PCP:  [place] [facility]      Insurance: Payor: / No coverage found.  CC:   No chief complaint on file.   VS Vitals:   05/29/16 1438  Pulse: 78  SpO2: 98%  Weight: 186 lb (84.4 kg)  Height: 5' 6.5" (1.689 m)    Weights Current Weight  05/29/16 186 lb (84.4 kg)  05/20/16 191 lb 12.8 oz (87 kg)  05/08/16 188 lb (85.3 kg)    Blood Pressure  BP Readings from Last 3 Encounters:  05/20/16 (!) 170/77  05/08/16 (!) 158/84  04/30/16 (!) 150/79     Admit date:  (Not on file) Last encounter with RMR:  Visit date not found   Allergy Ace inhibitors  Current Outpatient Prescriptions  Medication Sig Dispense Refill  . aspirin EC 81 MG tablet Take 1 tablet (81 mg total) by mouth daily. 30 tablet 0  . atorvastatin (LIPITOR) 40 MG tablet Take 40 mg by mouth daily.    . carvedilol (COREG) 12.5 MG tablet Take 1 tablet (12.5 mg total) by mouth 2 (two) times daily with a meal. 60 tablet 12  . furosemide (LASIX) 40 MG tablet Take 1 tablet (40 mg total) by mouth 2 (two) times daily. 60 tablet 3  . Glucose Blood (BLOOD GLUCOSE TEST STRIPS) STRP Use as directed 100 each 0  . insulin detemir (LEVEMIR) 100 UNIT/ML injection Inject 0.09 mLs (9 Units total) into the skin daily. 10 mL 11  . Insulin Pen Needle 29G X 10MM MISC Use as directed 100 each 0  . losartan (COZAAR) 100 MG tablet Take 1 tablet (100 mg total) by mouth daily. 30 tablet 12  . potassium chloride SA (K-DUR,KLOR-CON) 20 MEQ tablet Take 2 tablets (40 mEq total) by mouth 2 (two) times daily. 180 tablet 3  . sitaGLIPtin-metformin (JANUMET) 50-1000 MG tablet Take 1 tablet by mouth 2 (two) times daily with a meal.     . ticagrelor (BRILINTA) 90 MG TABS tablet Take 1 tablet (90 mg total) by mouth 2 (two) times daily. 60 tablet 12  . warfarin (COUMADIN) 5 MG tablet Take 1 tablet (5 mg total) by mouth daily at 6 PM. 30 tablet 3   No current  facility-administered medications for this visit.     Discontinued Meds:   There are no discontinued medications.  Patient Active Problem List   Diagnosis Date Noted  . CAD S/P PCI mLAD DES (Promus 3.0 x 24 -- 3.25 mm) 05/19/2016  . New onset atrial fibrillation ([place]) 05/19/2016  . Cardiomyopathy, ischemic     Class: Diagnosis of  . Anemia 04/26/2016  . CHF, acute on chronic ([place]) 04/26/2016  . CHF (congestive heart failure) ([place]) 04/26/2016  . Acute combined systolic and diastolic CHF, NYHA class 1 ([place]) 11/14/2015  . Hypokalemia 11/14/2015  . Acute respiratory failure with hypoxia ([place]) 11/14/2015  . Hypertensive emergency   . Stroke ([place]) 09/30/2014  . Acute CVA (cerebrovascular accident) ([place]) 09/30/2014  . Uncontrolled hypertension 09/30/2014  . Uncontrolled diabetes mellitus ([place]) 09/30/2014  . CVA (cerebral infarction) 09/30/2014    LABS    Component Value Date/Time   NA 140 05/19/2016 0545   NA 141 05/18/2016 0720   NA 141 05/12/2016 0839   K 3.3 (L) 05/19/2016 0545   K 3.4 (L) 05/18/2016 0720   K 3.4 (L) 05/12/2016 0839   CL 106  05/19/2016 0545   CL 107 05/18/2016 0720   CL 104 05/12/2016 0839   CO2 24 05/19/2016 0545   CO2 27 05/18/2016 0720   CO2 30 05/12/2016 0839   GLUCOSE 167 (H) 05/19/2016 0545   GLUCOSE 177 (H) 05/18/2016 0720   GLUCOSE 121 (H) 05/12/2016 0839   BUN 12 05/19/2016 0545   BUN 12 05/18/2016 0720   BUN 14 05/12/2016 0839   CREATININE 0.77 05/19/2016 0545   CREATININE 0.75 05/18/2016 0720   CREATININE 0.84 05/12/2016 0839   CREATININE 0.85 04/30/2016 0559   CALCIUM 8.8 (L) 05/19/2016 0545   CALCIUM 9.0 05/18/2016 0720   CALCIUM 8.8 05/12/2016 0839   GFRNONAA >60 05/19/2016 0545   GFRNONAA >60 05/18/2016 0720   GFRNONAA >60 04/30/2016 0559   GFRAA >60 05/19/2016 0545   GFRAA >60 05/18/2016 0720   GFRAA >60 04/30/2016 0559   CMP     Component Value Date/Time   NA 140 05/19/2016 0545   K 3.3 (L) 05/19/2016 0545   CL 106  05/19/2016 0545   CO2 24 05/19/2016 0545   GLUCOSE 167 (H) 05/19/2016 0545   BUN 12 05/19/2016 0545   CREATININE 0.77 05/19/2016 0545   CREATININE 0.84 05/12/2016 0839   CALCIUM 8.8 (L) 05/19/2016 0545   PROT 6.0 (L) 04/27/2016 0428   ALBUMIN 2.8 (L) 04/27/2016 0428   AST 40 04/27/2016 0428   ALT 63 (H) 04/27/2016 0428   ALKPHOS 155 (H) 04/27/2016 0428   BILITOT 0.6 04/27/2016 0428   GFRNONAA >60 05/19/2016 0545   GFRAA >60 05/19/2016 0545       Component Value Date/Time   WBC 5.9 05/19/2016 0545   WBC 5.1 05/12/2016 0839   WBC 6.6 04/28/2016 0640   HGB 10.0 (L) 05/19/2016 0545   HGB 10.7 (L) 05/12/2016 0839   HGB 10.8 (L) 04/28/2016 0640   HCT 32.1 (L) 05/19/2016 0545   HCT 33.3 (L) 05/12/2016 0839   HCT 33.9 (L) 04/28/2016 0640   MCV 73.8 (L) 05/19/2016 0545   MCV 70.9 (L) 05/12/2016 0839   MCV 73.4 (L) 04/28/2016 0640    Lipid Panel     Component Value Date/Time   CHOL 243 (H) 10/02/2014 0444   TRIG 53 10/02/2014 0444   HDL 101 10/02/2014 0444   CHOLHDL 2.4 10/02/2014 0444   VLDL 11 10/02/2014 0444   LDLCALC 131 (H) 10/02/2014 0444    ABG    Component Value Date/Time   TCO2 28 02/03/2013 1124     Lab Results  Component Value Date   TSH 1.011 04/27/2016   BNP (last 3 results)  Recent Labs  11/14/15 0750 03/09/16 1731 04/26/16 1322  BNP 217.0* 871.0* 666.0*    ProBNP (last 3 results) No results for input(s): PROBNP in the last 8760 hours.  Cardiac Panel (last 3 results) No results for input(s): CKTOTAL, CKMB, TROPONINI, RELINDX in the last 72 hours.  Iron/TIBC/Ferritin/ %Sat No results found for: IRON, TIBC, FERRITIN, IRONPCTSAT   EKG Orders placed or performed during the hospital encounter of 05/18/16  . EKG 12-Lead immediately post procedure  . EKG 12-Lead  . EKG 12-Lead  . EKG 12-Lead  . EKG 12-Lead immediately post procedure  . EKG 12-Lead  . EKG 12-Lead  . EKG 12-Lead  . EKG 12-Lead  . EKG 12-Lead  . EKG 12-Lead  . EKG 12-Lead      Prior Assessment and Plan Problem List as of 05/29/2016 Reviewed: 05/19/2016 12:46 AM by Glenetta Hew, MD  Cardiovascular and Mediastinum   Acute CVA (cerebrovascular accident) ([place])   Uncontrolled hypertension   Acute combined systolic and diastolic CHF, NYHA class 1 (HCC)   Cardiomyopathy, ischemic   CAD S/P PCI mLAD DES (Promus 3.0 x 24 -- 3.25 mm)   Stroke [facility])   Hypertensive emergency   CHF, acute on chronic (HCC)   CHF (congestive heart failure) ([place])   New onset atrial fibrillation (HCC)     Respiratory   Acute respiratory failure with hypoxia (HCC)     Endocrine   Uncontrolled diabetes mellitus (HCC)     Nervous and Auditory   CVA (cerebral infarction)     Other   Hypokalemia   Anemia       Imaging: No results found.

## 2016-05-29 NOTE — Patient Instructions (Signed)
Your physician recommends that you schedule a follow-up appointment in: 6 Weeks with Dr. Bronson Ing   Your physician recommends that you continue on your current medications as directed. Please refer to the Current Medication list given to you today.  Your physician recommends that you schedule a follow-up appointment With Lattie Haw in the [place] physician recommends that you return for lab work in: Today   You have been referred to [place]   If you need a refill on your cardiac medications before your next appointment, please call your pharmacy.  Thank you for choosing [facility]!

## 2016-05-29 NOTE — Patient Instructions (Signed)

## 2016-05-29 NOTE — Progress Notes (Signed)
Cardiology Office Note   Date:  05/29/2016   ID:  Amber Young, DOB 1955-12-18, MRN [id number]  PCP:  [facility]  Cardiologist: Woodroe Chen, NP   Chief Complaint  Patient presents with  . Coronary Artery Disease  . Hypertension  . Atrial Fibrillation      History of Present Illness: Amber Young is a 60 y.o. female who presents for posthospitalization follow-up after being admitted with recurrent chest pain, with a history of ischemic cardiomyopathy, acute CVA, uncontrolled hypertension, acute combined systolic and diastolic heart failure and new onset atrial fibrillation.  The patient presented to the hospital after being hospitalized with uncontrolled hypertension and shortness of breath. The patient had a cardiac catheterization at the request of Dr. Dr. Bronson Ing for ongoing symptoms.  Coronary Stent Intervention  Left Heart Cath and Coronary Angiography 05/18/16    Prox RCA lesion, 50 %stenosed. Some catheter dampening noted at times but this may have been related to catheter induced spasm based on the angiogram.  Ost RCA lesion, 50 %stenosed.  Ost 2nd Diag to 2nd Diag lesion, 80 %stenosed.  Mid LAD-1 lesion, 25 %stenosed.  1st Diag lesion, 25 %stenosed.  Mid LAD-2 lesion, 80 %stenosed. A STENT PROMUS PREM MR 3.0X24 drug eluting stent was successfully placed, and overlaps previously placed stent, postdilated to 3.25 mm.  Post intervention, there is a 0% residual stenosis.  The left ventricular ejection fraction is 40-45% by visual estimate.  There is mild left ventricular systolic dysfunction.  LV end diastolic pressure is moderately elevated.  Dual antiplatelet therapy for 12 months. Continue aggressive secondary prevention  Also during hospitalization the patient had new onset atrial fibrillation with a CHADS VASC Score of 7. She was started on anticoagulation therapy with warfarin, and would be on triple therapy with  aspirin and Brilinta and warfarin for one month and then remain on Brilinta and warfarin only. Her carvedilol was increased to 12.5 mg twice a day for better rate control. She is here for ongoing close follow-up  She continues to have significant fatigue. When she climbs the stairs to her apartment she has to stop and rest. She denies any recurrent chest pain, she denies any lower extremity edema. She has not yet been referred to cardiac rehabilitation. She also talks about having to adjust her insulin as outpatient as it is not well-controlled. She is seen by [facility].  Past Medical History:  Diagnosis Date  . Anemia   . CHF (congestive heart failure) ([place])   . Coronary artery disease   . GERD (gastroesophageal reflux disease)   . Heart murmur   . Hyperlipidemia   . Hypertension   . Stroke ([place]) 09/2014   numbness left upper lip, finger tips on left hand; "resolved" (05/18/2016)  . Type II diabetes mellitus ([place])     Past Surgical History:  Procedure Laterality Date  . CARDIAC CATHETERIZATION N/A 05/18/2016   Procedure: Left Heart Cath and Coronary Angiography;  Surgeon: Jettie Booze, MD;  Location: [facility];  Service: Cardiovascular;  Laterality: N/A;  . CARDIAC CATHETERIZATION N/A 05/18/2016   Procedure: Coronary Stent Intervention;  Surgeon: Jettie Booze, MD;  Location: [facility];  Service: Cardiovascular;  Laterality: N/A;  . CORONARY ANGIOPLASTY    . TUBAL LIGATION  1980  . VAGINAL HYSTERECTOMY  1999   "partial; fibroids"     Current Outpatient Prescriptions  Medication Sig Dispense Refill  . aspirin EC 81 MG tablet  Take 1 tablet (81 mg total) by mouth daily. 30 tablet 0  . atorvastatin (LIPITOR) 40 MG tablet Take 40 mg by mouth daily.    . carvedilol (COREG) 12.5 MG tablet Take 1 tablet (12.5 mg total) by mouth 2 (two) times daily with a meal. 60 tablet 12  . furosemide (LASIX) 40 MG tablet Take 1 tablet (40 mg total)  by mouth 2 (two) times daily. 60 tablet 3  . Glucose Blood (BLOOD GLUCOSE TEST STRIPS) STRP Use as directed 100 each 0  . insulin detemir (LEVEMIR) 100 UNIT/ML injection Inject 0.09 mLs (9 Units total) into the skin daily. 10 mL 11  . Insulin Pen Needle 29G X 10MM MISC Use as directed 100 each 0  . losartan (COZAAR) 100 MG tablet Take 1 tablet (100 mg total) by mouth daily. 30 tablet 12  . potassium chloride SA (K-DUR,KLOR-CON) 20 MEQ tablet Take 2 tablets (40 mEq total) by mouth 2 (two) times daily. 180 tablet 3  . sitaGLIPtin-metformin (JANUMET) 50-1000 MG tablet Take 1 tablet by mouth 2 (two) times daily with a meal.     . ticagrelor (BRILINTA) 90 MG TABS tablet Take 1 tablet (90 mg total) by mouth 2 (two) times daily. 60 tablet 12  . warfarin (COUMADIN) 5 MG tablet Take 1 tablet (5 mg total) by mouth daily at 6 PM. 30 tablet 3   No current facility-administered medications for this visit.     Allergies:   Ace inhibitors    Social History:  The patient  reports that she has never smoked. She has never used smokeless tobacco. She reports that she does not drink alcohol or use drugs.   Family History:  The patient's family history includes COPD in her mother; Diabetes in her mother; Heart failure in her mother; Hypertension in her brother, mother, and sister.    ROS: All other systems are reviewed and negative. Unless otherwise mentioned in H&P    PHYSICAL EXAM: VS:  BP 136/80   Pulse 78   Ht 5' 6.5" (1.689 m)   Wt 186 lb (84.4 kg)   SpO2 98%   BMI 29.57 kg/m  , BMI Body mass index is 29.57 kg/m. GEN: Well nourished, well developed, in no acute distress  HEENT: normal  Neck: no JVD, carotid bruits, or masses Cardiac: IRRR; no murmurs, rubs, or gallops,no edema  Respiratory:  Clear to auscultation bilaterally, normal work of breathing GI: soft, nontender, nondistended, + BS MS: no deformity or atrophy  Skin: warm and dry, no rash Neuro:  Strength and sensation are  intact Psych: euthymic mood, full affect  Recent Labs: 04/26/2016: B Natriuretic Peptide 666.0 04/27/2016: ALT 63; TSH 1.011 04/29/2016: Magnesium 1.9 05/19/2016: BUN 12; Creatinine, Ser 0.77; Hemoglobin 10.0; Platelets 250; Potassium 3.3; Sodium 140    Lipid Panel    Component Value Date/Time   CHOL 243 (H) 10/02/2014 0444   TRIG 53 10/02/2014 0444   HDL 101 10/02/2014 0444   CHOLHDL 2.4 10/02/2014 0444   VLDL 11 10/02/2014 0444   LDLCALC 131 (H) 10/02/2014 0444      Wt Readings from Last 3 Encounters:  05/29/16 186 lb (84.4 kg)  05/20/16 191 lb 12.8 oz (87 kg)  05/08/16 188 lb (85.3 kg)      Other studies Reviewed: Additional studies/ records that were reviewed today include:  Echocardiogram 04/27/2016 Left ventricle: The cavity size was normal. Wall thickness was   increased increased in a pattern of mild to moderate LVH.  Systolic function was mildly to moderately reduced. The estimated   ejection fraction was in the range of 40% to 45%. Doppler   parameters are consistent with restrictive physiology, indicative   of decreased left ventricular diastolic compliance and/or   increased left atrial pressure. - Regional wall motion abnormality: Hypokinesis of the mid   anterior, basal-mid anteroseptal, and mid anterolateral   myocardium. - Aortic valve: Mildly calcified annulus. Trileaflet; mildly   thickened leaflets. Valve area (VTI): 2.72 cm^2. Valve area   (Vmax): 2.64 cm^2. Valve area (Vmean): 2.23 cm^2. - Mitral valve: Mildly calcified annulus. Mildly thickened leaflets   . There was mild regurgitation. - Left atrium: The atrium was moderately dilated. - Right ventricle: Systolic function was mildly reduced. - Right atrium: The atrium was mildly dilated. - Atrial septum: No defect or patent foramen ovale was identified. - Tricuspid valve: There was moderate regurgitation. - Pulmonary arteries: Systolic pressure was mildly increased. PA   peak pressure: 36 mm Hg  (S).   ASSESSMENT AND PLAN:  1.  Coronary artery disease: Status post cardiac catheterization 05/18/2016 multivessel coronary artery disease with mid LAD to lesion of 80%, status post drug-eluting stent. She remains on dual antiplatelet therapy with Brilinta and aspirin. Aspirin is to be stopped on 06/20/2016. The patient has decreased LV systolic function with an EF of 40-45% by visual estimate. She will remain on carvedilol 12.5 mg twice a day and statin therapy.  She will be referred to cardiac rehabilitation. 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  2. Atrial fibrillation: Diagnosed during recent hospitalization. Heart rate is currently well controlled on beta blocker. She continues on Coumadin therapy. INR was checked here in the office today and was 4.1. Adjustments in her medications will be provided by the Coumadin clinic. She will need to be established in our Coumadin clinic here is [place]. Appointment will be made.CHADS VASC Score of 7. CBC will be ordered to evaluate for anemia.  3. Chronic combined diastolic and systolic CHF. This was confirmed by echocardiogram revealing LVEF of 17-71% with diastolic dysfunction and a restrictive pattern. She remains on losartan 100 mg daily along with Lasix 40 mg twice a day. Follow-up BMET will be ordered to evaluate current kidney function.  4. Hypertension: Blood pressure is moderately controlled for current systolic function. We'll continue to monitor this. Echocardiogram should be repeated in 3 months.  5. Insulin-dependent diabetes: She states that she is having more trouble TP her blood sugar under control. I will check hemoglobin A1c. We'll not treat diabetes and will defer to health department for ongoing management.     Current medicines are reviewed at length with the patient today.    Labs/ tests ordered today include:   Orders Placed This Encounter  Procedures  . HgB A1c  . Basic Metabolic Panel (BMET)   . CBC with Differential  . AMB referral to cardiac rehabilitation     Disposition:   FU with One month to 6 weeks Signed, Jory Sims, NP  05/29/2016 4:22 PM    [facility] [address] [place], [place] [address] Phone: [phone]; Fax: [phone]

## 2016-06-03 ENCOUNTER — Ambulatory Visit (INDEPENDENT_AMBULATORY_CARE_PROVIDER_SITE_OTHER): Payer: Self-pay | Admitting: *Deleted

## 2016-06-03 DIAGNOSIS — Z5181 Encounter for therapeutic drug level monitoring: Secondary | ICD-10-CM

## 2016-06-03 DIAGNOSIS — I4891 Unspecified atrial fibrillation: Secondary | ICD-10-CM

## 2016-06-03 DIAGNOSIS — I639 Cerebral infarction, unspecified: Secondary | ICD-10-CM

## 2016-06-03 LAB — POCT INR: INR: 3.7

## 2016-06-15 ENCOUNTER — Ambulatory Visit (INDEPENDENT_AMBULATORY_CARE_PROVIDER_SITE_OTHER): Payer: Self-pay | Admitting: *Deleted

## 2016-06-15 DIAGNOSIS — I4891 Unspecified atrial fibrillation: Secondary | ICD-10-CM

## 2016-06-15 DIAGNOSIS — Z5181 Encounter for therapeutic drug level monitoring: Secondary | ICD-10-CM

## 2016-06-15 DIAGNOSIS — I639 Cerebral infarction, unspecified: Secondary | ICD-10-CM

## 2016-06-15 LAB — POCT INR: INR: 2.4

## 2016-06-29 ENCOUNTER — Ambulatory Visit (INDEPENDENT_AMBULATORY_CARE_PROVIDER_SITE_OTHER): Payer: Self-pay | Admitting: *Deleted

## 2016-06-29 DIAGNOSIS — I639 Cerebral infarction, unspecified: Secondary | ICD-10-CM

## 2016-06-29 DIAGNOSIS — Z5181 Encounter for therapeutic drug level monitoring: Secondary | ICD-10-CM

## 2016-06-29 DIAGNOSIS — I4891 Unspecified atrial fibrillation: Secondary | ICD-10-CM

## 2016-06-29 LAB — POCT INR: INR: 1.2

## 2016-07-06 ENCOUNTER — Ambulatory Visit (INDEPENDENT_AMBULATORY_CARE_PROVIDER_SITE_OTHER): Payer: Self-pay | Admitting: *Deleted

## 2016-07-06 DIAGNOSIS — I4891 Unspecified atrial fibrillation: Secondary | ICD-10-CM

## 2016-07-06 DIAGNOSIS — I639 Cerebral infarction, unspecified: Secondary | ICD-10-CM

## 2016-07-06 DIAGNOSIS — Z5181 Encounter for therapeutic drug level monitoring: Secondary | ICD-10-CM

## 2016-07-06 LAB — POCT INR: INR: 1.9

## 2016-07-13 ENCOUNTER — Ambulatory Visit (INDEPENDENT_AMBULATORY_CARE_PROVIDER_SITE_OTHER): Payer: Self-pay | Admitting: *Deleted

## 2016-07-13 DIAGNOSIS — I639 Cerebral infarction, unspecified: Secondary | ICD-10-CM

## 2016-07-13 DIAGNOSIS — I4891 Unspecified atrial fibrillation: Secondary | ICD-10-CM

## 2016-07-13 DIAGNOSIS — Z5181 Encounter for therapeutic drug level monitoring: Secondary | ICD-10-CM

## 2016-07-13 LAB — POCT INR: INR: 2

## 2016-07-20 ENCOUNTER — Ambulatory Visit: Payer: Self-pay | Admitting: Cardiovascular Disease

## 2016-07-27 ENCOUNTER — Ambulatory Visit (INDEPENDENT_AMBULATORY_CARE_PROVIDER_SITE_OTHER): Payer: Self-pay | Admitting: *Deleted

## 2016-07-27 DIAGNOSIS — Z5181 Encounter for therapeutic drug level monitoring: Secondary | ICD-10-CM

## 2016-07-27 DIAGNOSIS — I4891 Unspecified atrial fibrillation: Secondary | ICD-10-CM

## 2016-07-27 DIAGNOSIS — I639 Cerebral infarction, unspecified: Secondary | ICD-10-CM

## 2016-07-27 LAB — POCT INR: INR: 3

## 2016-08-12 ENCOUNTER — Ambulatory Visit (INDEPENDENT_AMBULATORY_CARE_PROVIDER_SITE_OTHER): Payer: Self-pay | Admitting: *Deleted

## 2016-08-12 ENCOUNTER — Telehealth: Payer: Self-pay | Admitting: *Deleted

## 2016-08-12 DIAGNOSIS — Z5181 Encounter for therapeutic drug level monitoring: Secondary | ICD-10-CM

## 2016-08-12 DIAGNOSIS — I4891 Unspecified atrial fibrillation: Secondary | ICD-10-CM

## 2016-08-12 DIAGNOSIS — I639 Cerebral infarction, unspecified: Secondary | ICD-10-CM

## 2016-08-12 LAB — POCT INR: INR: 1.6

## 2016-08-12 NOTE — Telephone Encounter (Signed)
Spoke with [employer] who states patient has been approved for Brilinta from 07/28/16 until 07/28/17. [employer] states that the meds have not been shipped and that they do not know when they will be shipped.

## 2016-08-27 ENCOUNTER — Ambulatory Visit: Payer: Self-pay | Admitting: Cardiovascular Disease

## 2016-09-08 ENCOUNTER — Ambulatory Visit: Payer: Self-pay | Admitting: Cardiovascular Disease

## 2016-09-08 ENCOUNTER — Encounter: Payer: Self-pay | Admitting: Cardiovascular Disease

## 2016-09-08 ENCOUNTER — Ambulatory Visit (INDEPENDENT_AMBULATORY_CARE_PROVIDER_SITE_OTHER): Payer: Self-pay | Admitting: Cardiovascular Disease

## 2016-09-08 VITALS — BP 180/94 | HR 101 | Ht 67.0 in | Wt 177.0 lb

## 2016-09-08 DIAGNOSIS — Z79899 Other long term (current) drug therapy: Secondary | ICD-10-CM

## 2016-09-08 DIAGNOSIS — E876 Hypokalemia: Secondary | ICD-10-CM

## 2016-09-08 DIAGNOSIS — I25118 Atherosclerotic heart disease of native coronary artery with other forms of angina pectoris: Secondary | ICD-10-CM

## 2016-09-08 DIAGNOSIS — Z5181 Encounter for therapeutic drug level monitoring: Secondary | ICD-10-CM

## 2016-09-08 DIAGNOSIS — R002 Palpitations: Secondary | ICD-10-CM

## 2016-09-08 DIAGNOSIS — R0609 Other forms of dyspnea: Secondary | ICD-10-CM

## 2016-09-08 DIAGNOSIS — I5042 Chronic combined systolic (congestive) and diastolic (congestive) heart failure: Secondary | ICD-10-CM

## 2016-09-08 DIAGNOSIS — I471 Supraventricular tachycardia: Secondary | ICD-10-CM

## 2016-09-08 DIAGNOSIS — I10 Essential (primary) hypertension: Secondary | ICD-10-CM

## 2016-09-08 DIAGNOSIS — I4891 Unspecified atrial fibrillation: Secondary | ICD-10-CM

## 2016-09-08 DIAGNOSIS — I429 Cardiomyopathy, unspecified: Secondary | ICD-10-CM

## 2016-09-08 MED ORDER — CARVEDILOL 25 MG PO TABS: 25.0000 mg | ORAL_TABLET | Freq: Two times a day (BID) | ORAL | 3 refills | Status: DC

## 2016-09-08 NOTE — Progress Notes (Signed)
SUBJECTIVE: The patient presents for follow up of CAD, chronic systolic heart failure, hypertension, and atrial fibrillation.   Cath results (05/18/16):  Prox RCA lesion, 50 %stenosed. Some catheter dampening noted at times but this may have been related to catheter induced spasm based on the angiogram.  Ost RCA lesion, 50 %stenosed.  Ost 2nd Diag to 2nd Diag lesion, 80 %stenosed.  Mid LAD-1 lesion, 25 %stenosed.  1st Diag lesion, 25 %stenosed.  Mid LAD-2 lesion, 80 %stenosed. A STENT PROMUS PREM MR 3.0X24 drug eluting stent was successfully placed, and overlaps previously placed stent, postdilated to 3.25 mm.  Post intervention, there is a 0% residual stenosis.  The left ventricular ejection fraction is 40-45% by visual estimate.  There is mild left ventricular systolic dysfunction.  LV end diastolic pressure is moderately elevated.   Dual antiplatelet therapy for 12 months.  Continue aggressive secondary prevention.  During the procedure, the patient had paroxysmal SVT episodes.  She complains today of palpitations when she hears "loud talking or arguing, even on TV". Also complains of exertional dyspnea, says "I feel like I've been running all day".  Denies chest pain and leg swelling.    Review of Systems: As per "subjective", otherwise negative.  Allergies  Allergen Reactions  . Ace Inhibitors Cough    Current Outpatient Prescriptions  Medication Sig Dispense Refill  . atorvastatin (LIPITOR) 40 MG tablet Take 40 mg by mouth daily.    . carvedilol (COREG) 12.5 MG tablet Take 1 tablet (12.5 mg total) by mouth 2 (two) times daily with a meal. 60 tablet 12  . furosemide (LASIX) 40 MG tablet Take 1 tablet (40 mg total) by mouth 2 (two) times daily. 60 tablet 3  . Glucose Blood (BLOOD GLUCOSE TEST STRIPS) STRP Use as directed 100 each 0  . insulin detemir (LEVEMIR) 100 UNIT/ML injection Inject 0.09 mLs (9 Units total) into the skin daily. 10 mL 11  . Insulin Pen  Needle 29G X 10MM MISC Use as directed 100 each 0  . losartan (COZAAR) 100 MG tablet Take 1 tablet (100 mg total) by mouth daily. 30 tablet 12  . potassium chloride SA (K-DUR,KLOR-CON) 20 MEQ tablet Take 2 tablets (40 mEq total) by mouth 2 (two) times daily. 180 tablet 3  . sitaGLIPtin-metformin (JANUMET) 50-1000 MG tablet Take 1 tablet by mouth 2 (two) times daily with a meal.     . ticagrelor (BRILINTA) 90 MG TABS tablet Take 1 tablet (90 mg total) by mouth 2 (two) times daily. 60 tablet 12  . warfarin (COUMADIN) 5 MG tablet Take 1 tablet (5 mg total) by mouth daily at 6 PM. 30 tablet 3   No current facility-administered medications for this visit.     Past Medical History:  Diagnosis Date  . Anemia   . CHF (congestive heart failure) ([place])   . Coronary artery disease   . GERD (gastroesophageal reflux disease)   . Heart murmur   . Hyperlipidemia   . Hypertension   . Stroke ([place]) 09/2014   numbness left upper lip, finger tips on left hand; "resolved" (05/18/2016)  . Type II diabetes mellitus ([place])     Past Surgical History:  Procedure Laterality Date  . CARDIAC CATHETERIZATION N/A 05/18/2016   Procedure: Left Heart Cath and Coronary Angiography;  Surgeon: Jettie Booze, MD;  Location: [facility];  Service: Cardiovascular;  Laterality: N/A;  . CARDIAC CATHETERIZATION N/A 05/18/2016   Procedure: Coronary Stent Intervention;  Surgeon: Conception Oms  Hassell Done, MD;  Location: [facility];  Service: Cardiovascular;  Laterality: N/A;  . CORONARY ANGIOPLASTY    . TUBAL LIGATION  1980  . VAGINAL HYSTERECTOMY  1999   "partial; fibroids"    Social History   Social History  . Marital status: Married    Spouse name: N/A  . Number of children: N/A  . Years of education: 35   Occupational History  . hairdresser    Social History Main Topics  . Smoking status: Never Smoker  . Smokeless tobacco: Never Used  . Alcohol use No  . Drug use: No  . Sexual activity: Yes    Other Topics Concern  . Not on file   Social History Narrative   Married, 2 children, hairdresser   Caffeine use- occasional tea or coffee   Right handed        Vitals:   09/08/16 1526  BP: (!) 180/94  Pulse: (!) 101  SpO2: 97%  Weight: 177 lb (80.3 kg)  Height: 5\' 7"  (1.702 m)    PHYSICAL EXAM General: NAD HEENT: Normal. Neck: No JVD, no thyromegaly. Lungs: Clear to auscultation bilaterally with normal respiratory effort. CV: Nondisplaced PMI.  Rate at upper normal limits, regular rhythm, normal S1/S2, no S3/S4, no murmur. No pretibial or periankle edema.  No carotid bruit.   Abdomen: Soft, nontender, no distention.  Neurologic: Alert and oriented.  Psych: Normal affect. Skin: Normal. Musculoskeletal: No gross deformities.    ECG: Most recent ECG reviewed.      ASSESSMENT AND PLAN:  1. Cardiomyopathy/chronic combined systolic and diastolic heart failure with bilateral leg edema: Euvolemic. BP elevated. Will increase Coreg to 25 mg BID. On losartan 100 mg and Lasix 40 mg BID.  2. Malignant HTN with exertional dyspnea: Elevated. Will increase Coreg to 25 mg BID.  3. CAD: Continue Brilinta 90 mg bid, increase Coreg to 25 mg bid, and continue Lipitor 40 mg.  4. Left carotid bruit/disease: Carotid Dopplers showed moderate left-sided plaque disease with no hemodynamically significant stenosis.  5. A fib/PAT: Has palpitations, in a regular rhythm. Will increase Coreg to 25 mg bid. Will check BMET (K 3.3 on 05/19/16) and Hgb (10 on 05/19/16) and INR (1.6 on 08/12/16). Continue warfarin.  Dispo: fu 2 months.   Kate Sable, M.D., F.A.C.C.

## 2016-09-08 NOTE — Patient Instructions (Signed)
Medication Instructions:  Increase coreg to 25 mg - two times daily   Labwork: Your physician recommends that you return for lab work in: asap bmet cbc   Testing/Procedures: none  Follow-Up: Your physician recommends that you schedule a follow-up appointment in: 2 months    Any Other Special Instructions Will Be Listed Below (If Applicable).     If you need a refill on your cardiac medications before your next appointment, please call your pharmacy.

## 2016-09-14 ENCOUNTER — Ambulatory Visit (INDEPENDENT_AMBULATORY_CARE_PROVIDER_SITE_OTHER): Payer: Self-pay | Admitting: *Deleted

## 2016-09-14 DIAGNOSIS — I4891 Unspecified atrial fibrillation: Secondary | ICD-10-CM

## 2016-09-14 DIAGNOSIS — Z5181 Encounter for therapeutic drug level monitoring: Secondary | ICD-10-CM

## 2016-09-14 DIAGNOSIS — I639 Cerebral infarction, unspecified: Secondary | ICD-10-CM

## 2016-09-14 LAB — POCT INR: INR: 1.7

## 2016-10-12 ENCOUNTER — Ambulatory Visit (INDEPENDENT_AMBULATORY_CARE_PROVIDER_SITE_OTHER): Payer: Self-pay | Admitting: *Deleted

## 2016-10-12 DIAGNOSIS — Z5181 Encounter for therapeutic drug level monitoring: Secondary | ICD-10-CM

## 2016-10-12 DIAGNOSIS — I4891 Unspecified atrial fibrillation: Secondary | ICD-10-CM

## 2016-10-12 DIAGNOSIS — I639 Cerebral infarction, unspecified: Secondary | ICD-10-CM

## 2016-10-12 LAB — POCT INR: INR: 2.3

## 2016-11-06 ENCOUNTER — Ambulatory Visit: Payer: Self-pay | Admitting: Cardiovascular Disease

## 2016-12-02 ENCOUNTER — Emergency Department (HOSPITAL_COMMUNITY): Payer: Self-pay

## 2016-12-02 ENCOUNTER — Other Ambulatory Visit: Payer: Self-pay

## 2016-12-02 ENCOUNTER — Encounter (HOSPITAL_COMMUNITY): Payer: Self-pay

## 2016-12-02 ENCOUNTER — Emergency Department (HOSPITAL_COMMUNITY)
Admission: EM | Admit: 2016-12-02 | Discharge: 2016-12-02 | Disposition: A | Discharge status: Home / Self Care | Payer: Self-pay | Attending: Emergency Medicine | Admitting: Emergency Medicine

## 2016-12-02 DIAGNOSIS — I110 Hypertensive heart disease with heart failure: Secondary | ICD-10-CM | POA: Insufficient documentation

## 2016-12-02 DIAGNOSIS — Z7901 Long term (current) use of anticoagulants: Secondary | ICD-10-CM | POA: Insufficient documentation

## 2016-12-02 DIAGNOSIS — Z8673 Personal history of transient ischemic attack (TIA), and cerebral infarction without residual deficits: Secondary | ICD-10-CM | POA: Insufficient documentation

## 2016-12-02 DIAGNOSIS — R079 Chest pain, unspecified: Secondary | ICD-10-CM | POA: Insufficient documentation

## 2016-12-02 DIAGNOSIS — Z7982 Long term (current) use of aspirin: Secondary | ICD-10-CM | POA: Insufficient documentation

## 2016-12-02 DIAGNOSIS — R002 Palpitations: Secondary | ICD-10-CM

## 2016-12-02 DIAGNOSIS — I509 Heart failure, unspecified: Secondary | ICD-10-CM | POA: Insufficient documentation

## 2016-12-02 LAB — CBC
HCT: 36.3 % (ref 36.0–46.0)
Hemoglobin: 11.9 g/dL — ABNORMAL LOW (ref 12.0–15.0)
MCH: 24.1 pg — ABNORMAL LOW (ref 26.0–34.0)
MCHC: 32.8 g/dL (ref 30.0–36.0)
MCV: 73.6 fL — ABNORMAL LOW (ref 78.0–100.0)
Platelets: 246 10*3/uL (ref 150–400)
RBC: 4.93 MIL/uL (ref 3.87–5.11)
RDW: 15.3 % (ref 11.5–15.5)
WBC: 5.5 10*3/uL (ref 4.0–10.5)

## 2016-12-02 LAB — BRAIN NATRIURETIC PEPTIDE: B NATRIURETIC PEPTIDE 5: 828.7 pg/mL — AB (ref 0.0–100.0)

## 2016-12-02 LAB — I-STAT TROPONIN, ED
TROPONIN I, POC: 0.04 ng/mL (ref 0.00–0.08)
Troponin i, poc: 0.05 ng/mL (ref 0.00–0.08)

## 2016-12-02 LAB — BASIC METABOLIC PANEL
ANION GAP: 11 (ref 5–15)
BUN: 9 mg/dL (ref 6–20)
CALCIUM: 8.9 mg/dL (ref 8.9–10.3)
CO2: 23 mmol/L (ref 22–32)
Chloride: 101 mmol/L (ref 101–111)
Creatinine, Ser: 0.91 mg/dL (ref 0.44–1.00)
Glucose, Bld: 404 mg/dL — ABNORMAL HIGH (ref 65–99)
Potassium: 3.2 mmol/L — ABNORMAL LOW (ref 3.5–5.1)
SODIUM: 135 mmol/L (ref 135–145)

## 2016-12-02 LAB — PROTIME-INR
INR: 1.07
Prothrombin Time: 13.9 seconds (ref 11.4–15.2)

## 2016-12-02 LAB — APTT: aPTT: 27 seconds (ref 24–36)

## 2016-12-02 MED ORDER — FUROSEMIDE 20 MG PO TABS
40.0000 mg | ORAL_TABLET | Freq: Once | ORAL | Status: AC
  Administered 2016-12-02: 40 mg via ORAL
  Filled 2016-12-02: qty 2

## 2016-12-02 MED ORDER — CARVEDILOL 12.5 MG PO TABS
25.0000 mg | ORAL_TABLET | Freq: Once | ORAL | Status: AC
  Administered 2016-12-02: 25 mg via ORAL
  Filled 2016-12-02: qty 2

## 2016-12-02 MED ORDER — TICAGRELOR 90 MG PO TABS
90.0000 mg | ORAL_TABLET | Freq: Once | ORAL | Status: AC
  Administered 2016-12-02: 90 mg via ORAL
  Filled 2016-12-02: qty 1

## 2016-12-02 NOTE — ED Notes (Signed)
Pt ambulated without difficulty and denied any SOB, difficulty breathing, weakness. O2 sat maintained between 98%-100%

## 2016-12-02 NOTE — ED Provider Notes (Signed)
[facility] DEPT Provider Note   CSN: [id number] Arrival date & time: 12/02/16  1816     History   Chief Complaint Chief Complaint  Patient presents with  . Shortness of Breath  . Chest Pain    HPI Amber Young is a 61 y.o. female.  Patient with history of CHF, CAD (stent placed in 08/2016, per patient), DM, HTN, atrial fibrillation on Coumadin presents with symptoms of chest pain and SOB for 2-3 days. He reports "heart fluttering" since stent placement and was told that temporary palpitations would be normal after her catheterization. She states her symptoms have persisted and became worse in the last 2-3 days associated with new pain and SOB. She reports the palpitations have been worse with lying down and she has been sleeping sitting up with legs elevated. Despite the elevation, she reports chronic LE edema bilaterally for 2 months as well. No cough or fever. No nausea or vomiting.    The history is provided by the patient. No language interpreter was used.    Past Medical History:  Diagnosis Date  . Anemia   . CHF (congestive heart failure) ([place])   . Coronary artery disease   . GERD (gastroesophageal reflux disease)   . Heart murmur   . Hyperlipidemia   . Hypertension   . Stroke ([place]) 09/2014   numbness left upper lip, finger tips on left hand; "resolved" (05/18/2016)  . Type II diabetes mellitus [facility])     Patient Active Problem List   Diagnosis Date Noted  . Encounter for therapeutic drug monitoring 05/29/2016  . CAD S/P PCI mLAD DES (Promus 3.0 x 24 -- 3.25 mm) 05/19/2016  . New onset atrial fibrillation ([place]) 05/19/2016  . Cardiomyopathy, ischemic     Class: Diagnosis of  . Anemia 04/26/2016  . CHF, acute on chronic ([place]) 04/26/2016  . CHF (congestive heart failure) ([place]) 04/26/2016  . Acute combined systolic and diastolic CHF, NYHA class 1 ([place]) 11/14/2015  . Hypokalemia 11/14/2015  . Acute respiratory failure with hypoxia ([place]) 11/14/2015  .  Hypertensive emergency   . Stroke ([place]) 09/30/2014  . Acute CVA (cerebrovascular accident) ([place]) 09/30/2014  . Uncontrolled hypertension 09/30/2014  . Uncontrolled diabetes mellitus ([place]) 09/30/2014  . CVA (cerebral infarction) 09/30/2014    Past Surgical History:  Procedure Laterality Date  . CARDIAC CATHETERIZATION N/A 05/18/2016   Procedure: Left Heart Cath and Coronary Angiography;  Surgeon: Jettie Booze, MD;  Location: [facility] LAB;  Service: Cardiovascular;  Laterality: N/A;  . CARDIAC CATHETERIZATION N/A 05/18/2016   Procedure: Coronary Stent Intervention;  Surgeon: Jettie Booze, MD;  Location: [facility] CV LAB;  Service: Cardiovascular;  Laterality: N/A;  . CORONARY ANGIOPLASTY    . TUBAL LIGATION  1980  . VAGINAL HYSTERECTOMY  1999   "partial; fibroids"    OB History    Gravida Para Term Preterm AB Living   2         2   SAB TAB Ectopic Multiple Live Births                   Home Medications    Prior to Admission medications   Medication Sig Start Date End Date Taking? Authorizing Provider  atorvastatin (LIPITOR) 40 MG tablet Take 40 mg by mouth daily.    Historical Provider, MD  carvedilol (COREG) 25 MG tablet Take 1 tablet (25 mg total) by mouth 2 (two) times daily. 09/08/16 12/07/16  Herminio Commons, MD  furosemide (LASIX) 40  MG tablet Take 1 tablet (40 mg total) by mouth 2 (two) times daily. 02/14/16   Herminio Commons, MD  Glucose Blood (BLOOD GLUCOSE TEST STRIPS) STRP Use as directed 11/17/15   Kathie Dike, MD  insulin detemir (LEVEMIR) 100 UNIT/ML injection Inject 0.09 mLs (9 Units total) into the skin daily. 04/30/16   Eber Jones, MD  Insulin Pen Needle 29G X 10MM MISC Use as directed 11/17/15   Kathie Dike, MD  losartan (COZAAR) 100 MG tablet Take 1 tablet (100 mg total) by mouth daily. 05/20/16   Cheryln Manly, NP  potassium chloride SA (K-DUR,KLOR-CON) 20 MEQ tablet Take 2 tablets (40 mEq total) by mouth 2 (two) times  daily. 05/14/16   Herminio Commons, MD  sitaGLIPtin-metformin (JANUMET) 50-1000 MG tablet Take 1 tablet by mouth 2 (two) times daily with a meal.     Historical Provider, MD  ticagrelor (BRILINTA) 90 MG TABS tablet Take 1 tablet (90 mg total) by mouth 2 (two) times daily. 05/20/16   Cheryln Manly, NP  warfarin (COUMADIN) 5 MG tablet Take 1 tablet (5 mg total) by mouth daily at 6 PM. 05/20/16   Cheryln Manly, NP    Family History Family History  Problem Relation Age of Onset  . Diabetes Mother   . Hypertension Mother   . COPD Mother   . Heart failure Mother   . Hypertension Sister   . Hypertension Brother     Social History Social History  Substance Use Topics  . Smoking status: Never Smoker  . Smokeless tobacco: Never Used  . Alcohol use No     Allergies   Ace inhibitors   Review of Systems Review of Systems  Constitutional: Negative for chills and fever.  HENT: Negative.   Respiratory: Positive for shortness of breath. Negative for cough.   Cardiovascular: Positive for chest pain, palpitations and leg swelling.  Gastrointestinal: Negative.  Negative for abdominal pain and nausea.  Musculoskeletal: Negative.   Neurological: Negative.  Negative for weakness.     Physical Exam Updated Vital Signs BP (!) 215/119 (BP Location: Right Arm)   Pulse (!) 108   Temp 97.7 F (36.5 C)   Resp 18   Ht 5\' 6"  (1.676 m)   Wt 74.8 kg   SpO2 96%   BMI 26.63 kg/m   Physical Exam  Constitutional: She is oriented to person, place, and time. She appears well-developed and well-nourished. No distress.  HENT:  Head: Normocephalic.  Eyes: Conjunctivae are normal.  Neck: Normal range of motion. Neck supple.  Cardiovascular: Normal rate and regular rhythm.   No murmur heard. Pulmonary/Chest: Effort normal and breath sounds normal. She has no wheezes. She has no rales.  Abdominal: Soft. Bowel sounds are normal. There is no tenderness. There is no rebound and no guarding.    Musculoskeletal: Normal range of motion. She exhibits edema.  2+ pitting edema bilateral LE's.  Neurological: She is alert and oriented to person, place, and time.  Skin: Skin is warm and dry. No rash noted. She is not diaphoretic.  Psychiatric: She has a normal mood and affect.     ED Treatments / Results  Labs (all labs ordered are listed, but only abnormal results are displayed) Labs Reviewed  BASIC METABOLIC PANEL - Abnormal; Notable for the following:       Result Value   Potassium 3.2 (*)    Glucose, Bld 404 (*)    All other components within normal limits  CBC -  Abnormal; Notable for the following:    Hemoglobin 11.9 (*)    MCV 73.6 (*)    MCH 24.1 (*)    All other components within normal limits  BRAIN NATRIURETIC PEPTIDE - Abnormal; Notable for the following:    B Natriuretic Peptide 828.7 (*)    All other components within normal limits  PROTIME-INR  APTT  I-STAT TROPOININ, ED    EKG  EKG Interpretation None       Radiology Dg Chest 2 View  Result Date: 12/02/2016 CLINICAL DATA:  Chest pain and shortness of Breath EXAM: CHEST  2 VIEW COMPARISON:  04/26/2016 FINDINGS: Cardiac shadow is mildly prominent but stable. Previously seen vascular congestion has resolved in the interval. No focal infiltrate or sizable effusion is noted. Very minimal basilar atelectasis is seen. No bony abnormality is noted. IMPRESSION: Minimal basilar atelectasis. Significant improvement in the degree of congestive failure when compared with the prior study. Electronically Signed   By: Inez Catalina M.D.   On: 12/02/2016 19:03    Procedures Procedures (including critical care time)  Medications Ordered in ED Medications - No data to display   Initial Impression / Assessment and Plan / ED Course  I have reviewed the triage vital signs and the nursing notes.  Pertinent labs & imaging results that were available during my care of the patient were reviewed by me and considered in my  medical decision making (see chart for details).     Patient presents with ongoing palpitations, now with 2-3 days of chest pain and SOB. She is found to be significantly hypertensive with blood pressure 215/119. She reports compliance with her medications.   Patient provided pm dosing of all medications. Blood pressure downward trending. Encounter reviewed with Dr. Lita Mains who sees and evaluates the patient. Labs and EKG reviewed. She has been persistently pain free during ED evaluation.  Pain is not felt to be related to cardiac ischemia. She can be discharged home and is encouraged to follow up with her doctor for recheck in the next 1-2 days.  Final Clinical Impressions(s) / ED Diagnoses   Final diagnoses:  None   1. Chest pain 2. Palpitations  New Prescriptions New Prescriptions   No medications on file     Charlann Lange, Hershal Coria 12/02/16 [place], MD 12/07/16 [phone]

## 2016-12-02 NOTE — ED Triage Notes (Signed)
Pt reports hx of afib and today she felt her heart racing and developed sob and chest pain. Pt states the pain has resolved some at this tim  But is still 5/10. PT reports hx of chf with increased swelling of bilateral extremities and dyspnea with exertion. She states at night she can sleep because being flat makes it difficult for her to breath

## 2016-12-02 NOTE — ED Notes (Signed)
ED Provider at bedside. 

## 2016-12-20 ENCOUNTER — Encounter (HOSPITAL_COMMUNITY): Payer: Self-pay | Admitting: Emergency Medicine

## 2016-12-20 ENCOUNTER — Inpatient Hospital Stay (HOSPITAL_COMMUNITY)
Admission: EM | Admit: 2016-12-20 | Discharge: 2016-12-25 | DRG: 600 | Disposition: A | Discharge status: Home / Self Care | Payer: Self-pay | Attending: Family Medicine | Admitting: Family Medicine

## 2016-12-20 ENCOUNTER — Emergency Department (HOSPITAL_COMMUNITY): Payer: Self-pay

## 2016-12-20 DIAGNOSIS — N611 Abscess of the breast and nipple: Principal | ICD-10-CM | POA: Diagnosis present

## 2016-12-20 DIAGNOSIS — R739 Hyperglycemia, unspecified: Secondary | ICD-10-CM

## 2016-12-20 DIAGNOSIS — S42202A Unspecified fracture of upper end of left humerus, initial encounter for closed fracture: Secondary | ICD-10-CM

## 2016-12-20 DIAGNOSIS — I4891 Unspecified atrial fibrillation: Secondary | ICD-10-CM | POA: Diagnosis present

## 2016-12-20 DIAGNOSIS — N179 Acute kidney failure, unspecified: Secondary | ICD-10-CM | POA: Diagnosis present

## 2016-12-20 DIAGNOSIS — I160 Hypertensive urgency: Secondary | ICD-10-CM

## 2016-12-20 DIAGNOSIS — K219 Gastro-esophageal reflux disease without esophagitis: Secondary | ICD-10-CM | POA: Diagnosis present

## 2016-12-20 DIAGNOSIS — E119 Type 2 diabetes mellitus without complications: Secondary | ICD-10-CM | POA: Diagnosis present

## 2016-12-20 DIAGNOSIS — L723 Sebaceous cyst: Secondary | ICD-10-CM | POA: Diagnosis present

## 2016-12-20 DIAGNOSIS — I5042 Chronic combined systolic (congestive) and diastolic (congestive) heart failure: Secondary | ICD-10-CM | POA: Diagnosis present

## 2016-12-20 DIAGNOSIS — E876 Hypokalemia: Secondary | ICD-10-CM | POA: Diagnosis present

## 2016-12-20 DIAGNOSIS — L0390 Cellulitis, unspecified: Secondary | ICD-10-CM | POA: Diagnosis present

## 2016-12-20 DIAGNOSIS — S42301A Unspecified fracture of shaft of humerus, right arm, initial encounter for closed fracture: Secondary | ICD-10-CM | POA: Diagnosis present

## 2016-12-20 DIAGNOSIS — L02213 Cutaneous abscess of chest wall: Secondary | ICD-10-CM

## 2016-12-20 DIAGNOSIS — Z7901 Long term (current) use of anticoagulants: Secondary | ICD-10-CM

## 2016-12-20 DIAGNOSIS — I2510 Atherosclerotic heart disease of native coronary artery without angina pectoris: Secondary | ICD-10-CM | POA: Diagnosis present

## 2016-12-20 DIAGNOSIS — D649 Anemia, unspecified: Secondary | ICD-10-CM | POA: Diagnosis present

## 2016-12-20 DIAGNOSIS — R791 Abnormal coagulation profile: Secondary | ICD-10-CM | POA: Diagnosis not present

## 2016-12-20 DIAGNOSIS — I110 Hypertensive heart disease with heart failure: Secondary | ICD-10-CM | POA: Diagnosis present

## 2016-12-20 DIAGNOSIS — W1830XA Fall on same level, unspecified, initial encounter: Secondary | ICD-10-CM | POA: Diagnosis present

## 2016-12-20 DIAGNOSIS — Z9114 Patient's other noncompliance with medication regimen: Secondary | ICD-10-CM

## 2016-12-20 DIAGNOSIS — Z794 Long term (current) use of insulin: Secondary | ICD-10-CM

## 2016-12-20 DIAGNOSIS — Z9119 Patient's noncompliance with other medical treatment and regimen: Secondary | ICD-10-CM

## 2016-12-20 DIAGNOSIS — Z79899 Other long term (current) drug therapy: Secondary | ICD-10-CM

## 2016-12-20 LAB — COMPREHENSIVE METABOLIC PANEL
ALBUMIN: 2.5 g/dL — AB (ref 3.5–5.0)
ALK PHOS: 127 U/L — AB (ref 38–126)
ALT: 15 U/L (ref 14–54)
ANION GAP: 12 (ref 5–15)
AST: 20 U/L (ref 15–41)
BUN: 11 mg/dL (ref 6–20)
CO2: 26 mmol/L (ref 22–32)
Calcium: 8.2 mg/dL — ABNORMAL LOW (ref 8.9–10.3)
Chloride: 98 mmol/L — ABNORMAL LOW (ref 101–111)
Creatinine, Ser: 0.94 mg/dL (ref 0.44–1.00)
GFR calc Af Amer: >60 mL/min (ref >60)
GFR calc non Af Amer: >60 mL/min (ref >60)
Glucose, Bld: 455 mg/dL — ABNORMAL HIGH (ref 65–99)
POTASSIUM: 2.6 mmol/L — AB (ref 3.5–5.1)
SODIUM: 136 mmol/L (ref 135–145)
Total Bilirubin: 0.9 mg/dL (ref 0.3–1.2)
Total Protein: 6.4 g/dL — ABNORMAL LOW (ref 6.5–8.1)

## 2016-12-20 LAB — CBC WITH DIFFERENTIAL/PLATELET
BASOS ABS: 0 10*3/uL (ref 0.0–0.1)
BASOS PCT: 0 %
EOS ABS: 0 10*3/uL (ref 0.0–0.7)
Eosinophils Relative: 0 %
HCT: 33.9 % — ABNORMAL LOW (ref 36.0–46.0)
HEMOGLOBIN: 11.4 g/dL — AB (ref 12.0–15.0)
Lymphocytes Relative: 12 %
Lymphs Abs: 1 10*3/uL (ref 0.7–4.0)
MCH: 24.7 pg — ABNORMAL LOW (ref 26.0–34.0)
MCHC: 33.6 g/dL (ref 30.0–36.0)
MCV: 73.5 fL — ABNORMAL LOW (ref 78.0–100.0)
MONOS PCT: 8 %
Monocytes Absolute: 0.6 10*3/uL (ref 0.1–1.0)
NEUTROS PCT: 80 %
Neutro Abs: 6.7 10*3/uL (ref 1.7–7.7)
Platelets: 300 10*3/uL (ref 150–400)
RBC: 4.61 MIL/uL (ref 3.87–5.11)
RDW: 15.5 % (ref 11.5–15.5)
WBC: 8.4 10*3/uL (ref 4.0–10.5)

## 2016-12-20 LAB — I-STAT CHEM 8, ED
BUN: 10 mg/dL (ref 6–20)
Calcium, Ion: 1.11 mmol/L — ABNORMAL LOW (ref 1.15–1.40)
Chloride: 95 mmol/L — ABNORMAL LOW (ref 101–111)
Creatinine, Ser: 0.8 mg/dL (ref 0.44–1.00)
Glucose, Bld: 436 mg/dL — ABNORMAL HIGH (ref 65–99)
HEMATOCRIT: 33 % — AB (ref 36.0–46.0)
HEMOGLOBIN: 11.2 g/dL — AB (ref 12.0–15.0)
Potassium: 2.8 mmol/L — ABNORMAL LOW (ref 3.5–5.1)
SODIUM: 136 mmol/L (ref 135–145)
TCO2: 26 mmol/L (ref 0–100)

## 2016-12-20 LAB — GLUCOSE, CAPILLARY: Glucose-Capillary: 353 mg/dL — ABNORMAL HIGH (ref 65–99)

## 2016-12-20 LAB — I-STAT TROPONIN, ED
TROPONIN I, POC: 0.03 ng/mL (ref 0.00–0.08)
Troponin i, poc: 0.06 ng/mL (ref 0.00–0.08)

## 2016-12-20 LAB — I-STAT CG4 LACTIC ACID, ED: LACTIC ACID, VENOUS: 1.42 mmol/L (ref 0.5–1.9)

## 2016-12-20 LAB — PROTIME-INR
INR: 1.06
PROTHROMBIN TIME: 13.8 s (ref 11.4–15.2)

## 2016-12-20 LAB — CBG MONITORING, ED: Glucose-Capillary: 408 mg/dL — ABNORMAL HIGH (ref 65–99)

## 2016-12-20 LAB — PHOSPHORUS: Phosphorus: 3.7 mg/dL (ref 2.5–4.6)

## 2016-12-20 LAB — MAGNESIUM: Magnesium: 1.4 mg/dL — ABNORMAL LOW (ref 1.7–2.4)

## 2016-12-20 MED ORDER — ORAL CARE MOUTH RINSE: 15.0000 mL | Freq: Two times a day (BID) | OROMUCOSAL | Status: DC

## 2016-12-20 MED ORDER — INSULIN ASPART 100 UNIT/ML ~~LOC~~ SOLN
0.0000 [IU] | Freq: Three times a day (TID) | SUBCUTANEOUS | Status: DC
  Administered 2016-12-21 (×2): 7 [IU] via SUBCUTANEOUS
  Administered 2016-12-21: 3 [IU] via SUBCUTANEOUS
  Administered 2016-12-22: 2 [IU] via SUBCUTANEOUS
  Administered 2016-12-22: 3 [IU] via SUBCUTANEOUS
  Administered 2016-12-22: 5 [IU] via SUBCUTANEOUS
  Administered 2016-12-23: 10 [IU] via SUBCUTANEOUS
  Administered 2016-12-23: 7 [IU] via SUBCUTANEOUS
  Administered 2016-12-23: 2 [IU] via SUBCUTANEOUS
  Administered 2016-12-24: 5 [IU] via SUBCUTANEOUS
  Administered 2016-12-24: 9 [IU] via SUBCUTANEOUS
  Administered 2016-12-24: 3 [IU] via SUBCUTANEOUS
  Administered 2016-12-25: 2 [IU] via SUBCUTANEOUS

## 2016-12-20 MED ORDER — MAGNESIUM SULFATE 2 GM/50ML IV SOLN
2.0000 g | Freq: Once | INTRAVENOUS | Status: AC
  Administered 2016-12-20: 2 g via INTRAVENOUS
  Filled 2016-12-20: qty 50

## 2016-12-20 MED ORDER — ORAL CARE MOUTH RINSE
15.0000 mL | Freq: Two times a day (BID) | OROMUCOSAL | Status: DC
  Administered 2016-12-21 – 2016-12-24 (×5): 15 mL via OROMUCOSAL

## 2016-12-20 MED ORDER — HYDROMORPHONE HCL 1 MG/ML IJ SOLN
1.0000 mg | Freq: Once | INTRAMUSCULAR | Status: AC
  Administered 2016-12-20: 1 mg via INTRAVENOUS
  Filled 2016-12-20: qty 1

## 2016-12-20 MED ORDER — TICAGRELOR 90 MG PO TABS
90.0000 mg | ORAL_TABLET | Freq: Two times a day (BID) | ORAL | Status: DC
  Administered 2016-12-21 – 2016-12-25 (×9): 90 mg via ORAL
  Filled 2016-12-20 (×10): qty 1

## 2016-12-20 MED ORDER — INSULIN DETEMIR 100 UNIT/ML ~~LOC~~ SOLN
9.0000 [IU] | Freq: Every day | SUBCUTANEOUS | Status: DC
  Administered 2016-12-20 – 2016-12-22 (×3): 9 [IU] via SUBCUTANEOUS
  Filled 2016-12-20 (×4): qty 0.09

## 2016-12-20 MED ORDER — CLINDAMYCIN PHOSPHATE 600 MG/50ML IV SOLN
600.0000 mg | Freq: Once | INTRAVENOUS | Status: AC
  Administered 2016-12-20: 600 mg via INTRAVENOUS
  Filled 2016-12-20: qty 50

## 2016-12-20 MED ORDER — FUROSEMIDE 40 MG PO TABS
40.0000 mg | ORAL_TABLET | Freq: Two times a day (BID) | ORAL | Status: DC
  Administered 2016-12-20 – 2016-12-21 (×2): 40 mg via ORAL
  Filled 2016-12-20 (×2): qty 1

## 2016-12-20 MED ORDER — LIDOCAINE HCL 2 % IJ SOLN
10.0000 mL | Freq: Once | INTRAMUSCULAR | Status: AC
  Administered 2016-12-20: 200 mg via INTRADERMAL
  Filled 2016-12-20: qty 20

## 2016-12-20 MED ORDER — LOSARTAN POTASSIUM 50 MG PO TABS
100.0000 mg | ORAL_TABLET | Freq: Once | ORAL | Status: AC
  Administered 2016-12-20: 100 mg via ORAL
  Filled 2016-12-20: qty 2

## 2016-12-20 MED ORDER — CLINDAMYCIN PHOSPHATE 600 MG/50ML IV SOLN
600.0000 mg | Freq: Four times a day (QID) | INTRAVENOUS | Status: DC
  Administered 2016-12-20 – 2016-12-21 (×3): 600 mg via INTRAVENOUS
  Filled 2016-12-20 (×4): qty 50

## 2016-12-20 MED ORDER — WARFARIN - PHARMACIST DOSING INPATIENT: Freq: Every day | Status: DC

## 2016-12-20 MED ORDER — POTASSIUM CHLORIDE 10 MEQ/100ML IV SOLN
10.0000 meq | INTRAVENOUS | Status: AC
  Administered 2016-12-20 (×2): 10 meq via INTRAVENOUS
  Filled 2016-12-20 (×2): qty 100

## 2016-12-20 MED ORDER — ONDANSETRON HCL 4 MG/2ML IJ SOLN
4.0000 mg | Freq: Once | INTRAMUSCULAR | Status: AC
  Administered 2016-12-20: 4 mg via INTRAVENOUS
  Filled 2016-12-20: qty 2

## 2016-12-20 MED ORDER — CARVEDILOL 25 MG PO TABS
25.0000 mg | ORAL_TABLET | Freq: Two times a day (BID) | ORAL | Status: DC
  Administered 2016-12-20 – 2016-12-25 (×10): 25 mg via ORAL
  Filled 2016-12-20 (×10): qty 1

## 2016-12-20 MED ORDER — WARFARIN SODIUM 5 MG PO TABS
7.5000 mg | ORAL_TABLET | Freq: Once | ORAL | Status: AC
  Administered 2016-12-20: 7.5 mg via ORAL
  Filled 2016-12-20: qty 1

## 2016-12-20 MED ORDER — LOSARTAN POTASSIUM 50 MG PO TABS
100.0000 mg | ORAL_TABLET | Freq: Every day | ORAL | Status: DC
  Administered 2016-12-20 – 2016-12-23 (×4): 100 mg via ORAL
  Filled 2016-12-20 (×4): qty 2

## 2016-12-20 MED ORDER — IOPAMIDOL (ISOVUE-300) INJECTION 61%
INTRAVENOUS | Status: AC
  Administered 2016-12-20: 75 mL via INTRAVENOUS
  Filled 2016-12-20: qty 100

## 2016-12-20 MED ORDER — INSULIN ASPART 100 UNIT/ML ~~LOC~~ SOLN
10.0000 [IU] | Freq: Once | SUBCUTANEOUS | Status: AC
  Administered 2016-12-20: 10 [IU] via SUBCUTANEOUS
  Filled 2016-12-20: qty 1

## 2016-12-20 MED ORDER — WARFARIN SODIUM 5 MG PO TABS: 5.0000 mg | ORAL_TABLET | Freq: Every day | ORAL | Status: DC

## 2016-12-20 MED ORDER — ONDANSETRON HCL 4 MG/2ML IJ SOLN: 4.0000 mg | Freq: Four times a day (QID) | INTRAMUSCULAR | Status: DC | PRN

## 2016-12-20 MED ORDER — ONDANSETRON HCL 4 MG PO TABS: 4.0000 mg | ORAL_TABLET | Freq: Four times a day (QID) | ORAL | Status: DC | PRN

## 2016-12-20 MED ORDER — HYDRALAZINE HCL 20 MG/ML IJ SOLN
5.0000 mg | INTRAMUSCULAR | Status: DC | PRN
  Administered 2016-12-22: 5 mg via INTRAVENOUS
  Filled 2016-12-20: qty 1

## 2016-12-20 MED ORDER — CARVEDILOL 25 MG PO TABS
25.0000 mg | ORAL_TABLET | Freq: Once | ORAL | Status: AC
  Administered 2016-12-20: 25 mg via ORAL
  Filled 2016-12-20: qty 1

## 2016-12-20 MED ORDER — HYDROCODONE-ACETAMINOPHEN 5-325 MG PO TABS
1.0000 | ORAL_TABLET | ORAL | Status: DC | PRN
  Administered 2016-12-20: 2 via ORAL
  Administered 2016-12-21: 1 via ORAL
  Administered 2016-12-21: 2 via ORAL
  Filled 2016-12-20: qty 2
  Filled 2016-12-20: qty 1
  Filled 2016-12-20: qty 2
  Filled 2016-12-20: qty 1
  Filled 2016-12-20: qty 2

## 2016-12-20 MED ORDER — HYDRALAZINE HCL 20 MG/ML IJ SOLN
10.0000 mg | Freq: Once | INTRAMUSCULAR | Status: AC
  Administered 2016-12-20: 10 mg via INTRAVENOUS
  Filled 2016-12-20: qty 1

## 2016-12-20 MED ORDER — POTASSIUM CHLORIDE CRYS ER 20 MEQ PO TBCR
40.0000 meq | EXTENDED_RELEASE_TABLET | Freq: Once | ORAL | Status: AC
  Administered 2016-12-20: 40 meq via ORAL
  Filled 2016-12-20: qty 2

## 2016-12-20 MED ORDER — SODIUM CHLORIDE 0.9 % IV BOLUS (SEPSIS)
1000.0000 mL | Freq: Once | INTRAVENOUS | Status: AC
  Administered 2016-12-20: 1000 mL via INTRAVENOUS

## 2016-12-20 NOTE — H&P (Addendum)
History and Physical    Amber Young KCL:[id number] DOB: 07-23-1956 DOA: 12/20/2016  Referring MD/NP/PA: Dr. Darl Householder  PCP: [facility] [place]   Patient coming from: home   Chief Complaint: fall and left arm pain   HPI: Amber Young is a 61 y.o. female with known HTN, GERD, CAD, DM, a-fin on coumadin, presented to [facility] ED after an episode of fall. Pt reports she was at the church earlier today and her knees suddenly gave out and she fell backwards and fell on her left arm. Pt reports she felt sudden onset of sharp pain in the left arm, pain was 10/10 in severity and worse with even minimal movement, was given one time dose of fent 200 mcg and reports it helped with pain. Per EMS report, they have noted deformity in her left arm. Pt reports no chest pain at this time, no shortness of breath, no fevers, chills, no abd concerns, no urinary concerns. Pt reports she has not been compliant with her doctors appointment and has not been taking her medications consistently. She has noted small breast masses under both breasts and was worried about cancer.   ED Course: Pt clinically stable, VS notable for initial BP 204/112, once pain medication given repeat BP was 118/76. Blood work notable for K 2.8, CBG in 400's. Imaging studies notable for ? Left breast cyst. [place] with bilateral breast abscess, Dr. Darl Householder performed I&D of the right breast abscess, started Clindamycin. Wound cultures sent for analysis. [facility] asked to admit for further evaluation. Ortho team consulted as well.   Review of Systems:  Constitutional: Negative for appetite change and fatigue.  HENT: Negative for ear pain, nosebleeds, congestion, facial swelling, rhinorrhea, neck pain Eyes: Negative for pain, discharge, redness, itching and visual disturbance.  Respiratory: Negative for wheezing and stridor.   Cardiovascular: Negative for palpitations and leg swelling.  Gastrointestinal: Negative for abdominal distention.   Genitourinary: Negative for dysuria, urgency, frequency, hematuria, flank pain, decreased urine volume Musculoskeletal: Negative for back pain Neurological: Negative for dizziness, tremors, seizures, syncope, facial asymmetry Hematological: Negative for adenopathy. Does not bruise/bleed easily.  Psychiatric/Behavioral: Negative for hallucinations, behavioral problems, confusion, dysphoric mood, decreased concentration and agitation.   Past Medical History:  Diagnosis Date  . Anemia   . CHF (congestive heart failure) ([place])   . Coronary artery disease   . GERD (gastroesophageal reflux disease)   . Heart murmur   . Hyperlipidemia   . Hypertension   . Stroke ([place]) 09/2014   numbness left upper lip, finger tips on left hand; "resolved" (05/18/2016)  . Type II diabetes mellitus ([place])     Past Surgical History:  Procedure Laterality Date  . CARDIAC CATHETERIZATION N/A 05/18/2016   Procedure: Left Heart Cath and Coronary Angiography;  Surgeon: Jettie Booze, MD;  Location: [facility];  Service: Cardiovascular;  Laterality: N/A;  . CARDIAC CATHETERIZATION N/A 05/18/2016   Procedure: Coronary Stent Intervention;  Surgeon: Jettie Booze, MD;  Location: [facility];  Service: Cardiovascular;  Laterality: N/A;  . CORONARY ANGIOPLASTY    . TUBAL LIGATION  1980  . VAGINAL HYSTERECTOMY  1999   "partial; fibroids"   Social Hx:  reports that she has never smoked. She has never used smokeless tobacco. She reports that she does not drink alcohol or use drugs.  Allergies  Allergen Reactions  . Ace Inhibitors Cough    Family History  Problem Relation Age of Onset  . Diabetes Mother   . Hypertension  Mother   . COPD Mother   . Heart failure Mother   . Hypertension Sister   . Hypertension Brother     Prior to Admission medications   Medication Sig Start Date End Date Taking? Authorizing Provider  furosemide (LASIX) 40 MG tablet Take 1 tablet (40 mg total) by mouth 2  (two) times daily. 02/14/16  Yes Herminio Commons, MD  insulin detemir (LEVEMIR) 100 UNIT/ML injection Inject 0.09 mLs (9 Units total) into the skin daily. 04/30/16  Yes Eber Jones, MD  losartan (COZAAR) 100 MG tablet Take 1 tablet (100 mg total) by mouth daily. 05/20/16  Yes Reino Bellis B, NP  warfarin (COUMADIN) 5 MG tablet Take 1 tablet (5 mg total) by mouth daily at 6 PM. 05/20/16  Yes Cheryln Manly, NP  carvedilol (COREG) 25 MG tablet Take 1 tablet (25 mg total) by mouth 2 (two) times daily. Patient not taking: Reported on 12/20/2016 09/08/16 12/07/16  Herminio Commons, MD  Glucose Blood (BLOOD GLUCOSE TEST STRIPS) STRP Use as directed 11/17/15   Kathie Dike, MD  Insulin Pen Needle 29G X 10MM MISC Use as directed 11/17/15   Kathie Dike, MD  potassium chloride SA (K-DUR,KLOR-CON) 20 MEQ tablet Take 2 tablets (40 mEq total) by mouth 2 (two) times daily. Patient not taking: Reported on 12/20/2016 05/14/16   Herminio Commons, MD  ticagrelor (BRILINTA) 90 MG TABS tablet Take 1 tablet (90 mg total) by mouth 2 (two) times daily. 05/20/16   Cheryln Manly, NP    Physical Exam: Vitals:   12/20/16 1334 12/20/16 1400 12/20/16 1412 12/20/16 1500  BP: (!) 192/105 (!) 199/126 (!) 199/126 (!) 187/103  Pulse: 95 95 94 92  Resp: 18 18  19   Temp:      TempSrc:      SpO2: 98% 99%  100%  Weight:      Height:        Constitutional: NAD, calm, comfortable Vitals:   12/20/16 1334 12/20/16 1400 12/20/16 1412 12/20/16 1500  BP: (!) 192/105 (!) 199/126 (!) 199/126 (!) 187/103  Pulse: 95 95 94 92  Resp: 18 18  19   Temp:      TempSrc:      SpO2: 98% 99%  100%  Weight:      Height:       Eyes: PERRL, lids and conjunctivae normal ENMT: Mucous membranes are moist. Posterior pharynx clear of any exudate or lesions.Normal dentition.  Neck: normal, supple, no masses, no thyromegaly Respiratory: Normal respiratory effort. No accessory muscle use.  Cardiovascular:  irregular rate, no rubs / gallops.  2+ pedal pulses. No carotid bruits.  Abdomen: no tenderness, no masses palpated. No hepatosplenomegaly. Bowel sounds positive.  Musculoskeletal: no clubbing / cyanosis. Left arm in sling, very TTP Skin: two areas under the breast, TTP with erythema, covered with gauze  Neurologic: CN 2-12 grossly intact. Sensation intact, DTR normal. Strength 5/5 in all 4.  Psychiatric: Normal judgment and insight. Alert and oriented x 3. Normal mood.   Labs on Admission: I have personally reviewed following labs and imaging studies  CBC:  Recent Labs Lab 12/20/16 1320 12/20/16 1605  WBC 8.4  --   NEUTROABS 6.7  --   HGB 11.4* 11.2*  HCT 33.9* 33.0*  MCV 73.5*  --   PLT 300  --    Basic Metabolic Panel:  Recent Labs Lab 12/20/16 1320 12/20/16 1605  NA 136 136  K 2.6* 2.8*  CL 98* 95*  CO2 26  --   GLUCOSE 455* 436*  BUN 11 10  CREATININE 0.94 0.80  CALCIUM 8.2*  --    Liver Function Tests:  Recent Labs Lab 12/20/16 1320  AST 20  ALT 15  ALKPHOS 127*  BILITOT 0.9  PROT 6.4*  ALBUMIN 2.5*   Coagulation Profile:  Recent Labs Lab 12/20/16 1332  INR 1.06   CBG:  Recent Labs Lab 12/20/16 1554  GLUCAP 408*   Urine analysis:    Component Value Date/Time   COLORURINE YELLOW 12/05/2015 [place] 12/05/2015 1755   LABSPEC 1.030 12/05/2015 1755   PHURINE 5.5 12/05/2015 1755   GLUCOSEU 100 (A) 12/05/2015 1755   HGBUR MODERATE (A) 12/05/2015 [place] 12/05/2015 1755   KETONESUR NEGATIVE 12/05/2015 1755   PROTEINUR 100 (A) 12/05/2015 1755   UROBILINOGEN 1.0 04/12/2013 2208   NITRITE NEGATIVE 12/05/2015 1755   LEUKOCYTESUR TRACE (A) 12/05/2015 1755   Radiological Exams on Admission: Ct Head Wo Contrast  Result Date: 12/20/2016 CLINICAL DATA:  Pain after fall. EXAM: CT HEAD WITHOUT CONTRAST CT CERVICAL SPINE WITHOUT CONTRAST TECHNIQUE: Multidetector CT imaging of the head and cervical spine was  performed following the standard protocol without intravenous contrast. Multiplanar CT image reconstructions of the cervical spine were also generated. COMPARISON:  September 30, 2014 FINDINGS: CT HEAD FINDINGS Brain: No subdural, epidural, or subarachnoid hemorrhages identified. Ventricles and sulci are prominent but stable. Focal encephalomalacia in the posterior left parietal lobe on series 2, image 23 is unchanged and chronic. Mild white matter changes. No acute cortical ischemia or infarct identified. No mass, mass effect, or midline shift. Vascular: Calcified atherosclerosis is seen in the intracranial carotid arteries. Skull: Normal. Negative for fracture or focal lesion. Sinuses/Orbits: No acute finding. Other: A focus of air in the right anterior soft tissues on series 2, image 317 could be posttraumatic or air within a vessel. No significant soft tissue swelling is seen in the extracranial soft tissues. The orbits are intact. CT CERVICAL SPINE FINDINGS Alignment: Normal. Skull base and vertebrae: No acute fracture. No primary bone lesion or focal pathologic process. Soft tissues and spinal canal: No prevertebral fluid or swelling. No visible canal hematoma. Disc levels:  No significant degenerative changes. Upper chest: Negative. Other: No other abnormalities are identified. IMPRESSION: 1. No acute intracranial abnormality. 2. No fracture or traumatic malalignment in the cervical spine. Electronically Signed   By: Dorise Bullion III M.D   On: 12/20/2016 16:10   Ct Chest W Contrast  Result Date: 12/20/2016 CLINICAL DATA:  Fall, chest bruising/swelling, left humerus fracture EXAM: CT CHEST WITH CONTRAST TECHNIQUE: Multidetector CT imaging of the chest was performed during intravenous contrast administration. CONTRAST:  75 mL Isovue 300 IV COMPARISON:  Chest radiographs dated 12/02/2016. Left humerus radiographs dated 12/20/2016. CT chest dated 11/15/2015. FINDINGS: Cardiovascular: Mild cardiomegaly.  No  pericardial effusion. Coronary atherosclerosis of the LAD. No evidence of thoracic aortic aneurysm. Very mild atherosclerotic calcifications. Mediastinum/Nodes: No suspicious mediastinal lymphadenopathy. Fluid along the mid/ distal esophagus. Visualized left thyroid is notable for a 10 mm nodule. Lungs/Pleura: Evaluation lung parenchyma is mildly constrained by respiratory motion. Mild atelectasis in the bilateral lower lobes. No suspicious pulmonary nodules. No focal consolidation. Small left pleural effusion. No pneumothorax. Upper Abdomen: Visualized upper abdomen is unremarkable. Musculoskeletal: Subcutaneous cystic lesion along the right anterior chest wall measuring 3.1 x 4.3 cm (series 2/image 116), grossly unchanged from 2017, likely reflecting a benign sebaceous cyst. Additional subcutaneous cystic lesion  along the left anterior chest wall measuring 2.8 x 5.8 cm (series 2/image 110) and is notable for a mildly thickened rim and at least one septation (series 2/ image 111). This previously measured 1.6 cm in 2017, when the appearance was compatible with a sebaceous cyst. Given the clinical history, superimposed hematoma/seroma is possible. Mild degenerative changes of the thoracic spine. No evidence of sternal fracture. Comminuted left proximal humeral fracture (series 2/ image 33), incompletely visualized. IMPRESSION: Comminuted left proximal humeral fracture, incompletely visualized. 5.8 cm subcutaneous cystic lesion along the left anterior chest wall, possibly reflecting hematoma/ seroma superimposed upon a pre-existing sebaceous cyst. Given interval growth, attention on follow-up is suggested. Small left pleural effusion. Electronically Signed   By: Julian Hy M.D.   On: 12/20/2016 16:10   Ct Cervical Spine Wo Contrast  Result Date: 12/20/2016 CLINICAL DATA:  Pain after fall. EXAM: CT HEAD WITHOUT CONTRAST CT CERVICAL SPINE WITHOUT CONTRAST TECHNIQUE: Multidetector CT imaging of the head and  cervical spine was performed following the standard protocol without intravenous contrast. Multiplanar CT image reconstructions of the cervical spine were also generated. COMPARISON:  September 30, 2014 FINDINGS: CT HEAD FINDINGS Brain: No subdural, epidural, or subarachnoid hemorrhages identified. Ventricles and sulci are prominent but stable. Focal encephalomalacia in the posterior left parietal lobe on series 2, image 23 is unchanged and chronic. Mild white matter changes. No acute cortical ischemia or infarct identified. No mass, mass effect, or midline shift. Vascular: Calcified atherosclerosis is seen in the intracranial carotid arteries. Skull: Normal. Negative for fracture or focal lesion. Sinuses/Orbits: No acute finding. Other: A focus of air in the right anterior soft tissues on series 2, image 317 could be posttraumatic or air within a vessel. No significant soft tissue swelling is seen in the extracranial soft tissues. The orbits are intact. CT CERVICAL SPINE FINDINGS Alignment: Normal. Skull base and vertebrae: No acute fracture. No primary bone lesion or focal pathologic process. Soft tissues and spinal canal: No prevertebral fluid or swelling. No visible canal hematoma. Disc levels:  No significant degenerative changes. Upper chest: Negative. Other: No other abnormalities are identified. IMPRESSION: 1. No acute intracranial abnormality. 2. No fracture or traumatic malalignment in the cervical spine. Electronically Signed   By: Dorise Bullion III M.D   On: 12/20/2016 16:10   Dg Humerus Left  Result Date: 12/20/2016 CLINICAL DATA:  Pain after trauma. EXAM: LEFT HUMERUS - 2+ VIEW COMPARISON:  None. FINDINGS: There is an angulated comminuted fracture of the proximal humerus without obvious humeral head dislocation. If there is concern for humeral head dislocation, recommend dedicated images of the shoulder. IMPRESSION: Comminuted angulated fracture of the proximal humeral diaphysis/neck.  Electronically Signed   By: Dorise Bullion III M.D   On: 12/20/2016 13:08    EKG: pending   Assessment/Plan Active Problems: Proxima humeral diaphysis/neck fracture - per ortho, recommend trial of non operative management of her left proximal humerus fracture in a sling - should be non weight bearing to the left arm - allow analgesia as needed - OT eval when able to participate   Breast abscesses with cellulitis - drained in ED by Dr. Darl Householder, still TTP - fluid send for culture  - blood cultures also obtained - no WBC obtained on admission - pt wants to hold off on further IV sticks, will ask for blood work in AM  - pt started on Clindamycin   Hypokalemia - supplemented in ED, check Mg level - BMP in AM  Chronic combined CHF -  last EF in 04/2016 with EF 96-04% and diastolic CHF  HTN, accelerated - better now that pain is better controlled - resume home medical regimen with lasix, losartan, coreg  A-fib, rate controlled - CHADS 2 score 3 - continue coumadin per pharmacy, continue home medical regimen with Brilinta - INR subtherapeutic and likely in the setting of non compliance - discussed importance of medical compliance   DM type II - placed on home medical regimen, Levemir - also added SSI  DVT prophylaxis: Coumadin per pharmacy Code Status: Full  Family Communication: Pt and husband updated at bedside Disposition Plan: will likely home  Consults called: Ortho  Admission status: Inpatient   Faye Ramsay MD [facility] Pager [phone]  If 7PM-7AM, please contact night-coverage [email or web address] Password [facility]  12/20/2016, 4:43 PM

## 2016-12-20 NOTE — ED Provider Notes (Signed)
[facility] DEPT Provider Note   CSN: [id number] Arrival date & time: 12/20/16  1219     History   Chief Complaint Chief Complaint  Patient presents with  . Arm Injury    HPI Laurabelle Gorczyca is a 61 y.o. female hx of GERD, CAD, DM, HTN, here with Fall. Patient was at church and states that her knees gave out and she fell backwards onto her head and left arm. She was noted by EMS to have an obvious L upper arm deformity and given 200 mg fentanyl. She was not sure if she hit her head or not and she is on Coumadin. Patient also noticed that her blood pressure has been uncontrolled and she was seen in the ED about 2 weeks ago for chest pain her blood pressure was elevated at that time. Patient states that she has not been really followed up with her doctor has not been taking her medicines as prescribed. She also has history of diabetes but he has not been taking her Levemir or metformin as prescribed. The the last week or so she noticed some swelling below her breast and was concerned that she may have a breast mass. Denies any fevers or chills. She has been having poor appetite and has been losing some weight.     The history is provided by the patient.    Past Medical History:  Diagnosis Date  . Anemia   . CHF (congestive heart failure) ([place])   . Coronary artery disease   . GERD (gastroesophageal reflux disease)   . Heart murmur   . Hyperlipidemia   . Hypertension   . Stroke ([place]) 09/2014   numbness left upper lip, finger tips on left hand; "resolved" (05/18/2016)  . Type II diabetes mellitus [facility])     Patient Active Problem List   Diagnosis Date Noted  . Encounter for therapeutic drug monitoring 05/29/2016  . CAD S/P PCI mLAD DES (Promus 3.0 x 24 -- 3.25 mm) 05/19/2016  . New onset atrial fibrillation ([place]) 05/19/2016  . Cardiomyopathy, ischemic     Class: Diagnosis of  . Anemia 04/26/2016  . CHF, acute on chronic ([place]) 04/26/2016  . CHF (congestive heart failure) ([place])  04/26/2016  . Acute combined systolic and diastolic CHF, NYHA class 1 ([place]) 11/14/2015  . Hypokalemia 11/14/2015  . Acute respiratory failure with hypoxia ([place]) 11/14/2015  . Hypertensive emergency   . Stroke ([place]) 09/30/2014  . Acute CVA (cerebrovascular accident) ([place]) 09/30/2014  . Uncontrolled hypertension 09/30/2014  . Uncontrolled diabetes mellitus ([place]) 09/30/2014  . CVA (cerebral infarction) 09/30/2014    Past Surgical History:  Procedure Laterality Date  . CARDIAC CATHETERIZATION N/A 05/18/2016   Procedure: Left Heart Cath and Coronary Angiography;  Surgeon: Jettie Booze, MD;  Location: [facility] LAB;  Service: Cardiovascular;  Laterality: N/A;  . CARDIAC CATHETERIZATION N/A 05/18/2016   Procedure: Coronary Stent Intervention;  Surgeon: Jettie Booze, MD;  Location: [facility] LAB;  Service: Cardiovascular;  Laterality: N/A;  . CORONARY ANGIOPLASTY    . TUBAL LIGATION  1980  . VAGINAL HYSTERECTOMY  1999   "partial; fibroids"    OB History    Gravida Para Term Preterm AB Living   2         2   SAB TAB Ectopic Multiple Live Births                   Home Medications    Prior to Admission medications   Medication  Sig Start Date End Date Taking? Authorizing Provider  furosemide (LASIX) 40 MG tablet Take 1 tablet (40 mg total) by mouth 2 (two) times daily. 02/14/16  Yes Herminio Commons, MD  insulin detemir (LEVEMIR) 100 UNIT/ML injection Inject 0.09 mLs (9 Units total) into the skin daily. 04/30/16  Yes Eber Jones, MD  losartan (COZAAR) 100 MG tablet Take 1 tablet (100 mg total) by mouth daily. 05/20/16  Yes Reino Bellis B, NP  warfarin (COUMADIN) 5 MG tablet Take 1 tablet (5 mg total) by mouth daily at 6 PM. 05/20/16  Yes Cheryln Manly, NP  carvedilol (COREG) 25 MG tablet Take 1 tablet (25 mg total) by mouth 2 (two) times daily. Patient not taking: Reported on 12/20/2016 09/08/16 12/07/16  Herminio Commons, MD  Glucose Blood  (BLOOD GLUCOSE TEST STRIPS) STRP Use as directed 11/17/15   Kathie Dike, MD  Insulin Pen Needle 29G X 10MM MISC Use as directed 11/17/15   Kathie Dike, MD  potassium chloride SA (K-DUR,KLOR-CON) 20 MEQ tablet Take 2 tablets (40 mEq total) by mouth 2 (two) times daily. Patient not taking: Reported on 12/20/2016 05/14/16   Herminio Commons, MD  ticagrelor (BRILINTA) 90 MG TABS tablet Take 1 tablet (90 mg total) by mouth 2 (two) times daily. 05/20/16   Cheryln Manly, NP    Family History Family History  Problem Relation Age of Onset  . Diabetes Mother   . Hypertension Mother   . COPD Mother   . Heart failure Mother   . Hypertension Sister   . Hypertension Brother     Social History Social History  Substance Use Topics  . Smoking status: Never Smoker  . Smokeless tobacco: Never Used  . Alcohol use No     Allergies   Ace inhibitors   Review of Systems Review of Systems  Musculoskeletal:       L upper arm pain   All other systems reviewed and are negative.    Physical Exam Updated Vital Signs BP (!) 187/103   Pulse 92   Temp 98.6 F (37 C) (Oral)   Resp 19   Ht 5\' 6"  (1.676 m)   Wt 165 lb (74.8 kg)   SpO2 100%   BMI 26.63 kg/m   Physical Exam  Constitutional:  Chronically ill, dehydrated   HENT:  Head: Normocephalic.  No obvious   Eyes: Pupils are equal, round, and reactive to light.  Neck: Normal range of motion. Neck supple.  Cardiovascular: Normal rate, regular rhythm and normal heart sounds.   Pulmonary/Chest: Effort normal and breath sounds normal. No respiratory distress. She has no wheezes.  Bilateral cellulitis lower part of anterior ribs below the breast. No nipple discharge. No breast swelling.   Abdominal: Soft. Bowel sounds are normal. She exhibits no distension. There is no tenderness.  Musculoskeletal: Normal range of motion.  L proximal humerus tenderness with obvious deformity. No elbow or forearm tenderness   Neurological: She is  alert.  Skin: Skin is warm.  Psychiatric: She has a normal mood and affect.  Nursing note and vitals reviewed.    ED Treatments / Results  Labs (all labs ordered are listed, but only abnormal results are displayed) Labs Reviewed  CBC WITH DIFFERENTIAL/PLATELET - Abnormal; Notable for the following:       Result Value   Hemoglobin 11.4 (*)    HCT 33.9 (*)    MCV 73.5 (*)    MCH 24.7 (*)    All other  components within normal limits  COMPREHENSIVE METABOLIC PANEL - Abnormal; Notable for the following:    Potassium 2.6 (*)    Chloride 98 (*)    Glucose, Bld 455 (*)    Calcium 8.2 (*)    Total Protein 6.4 (*)    Albumin 2.5 (*)    Alkaline Phosphatase 127 (*)    All other components within normal limits  CBG MONITORING, ED - Abnormal; Notable for the following:    Glucose-Capillary 408 (*)    All other components within normal limits  I-STAT CHEM 8, ED - Abnormal; Notable for the following:    Potassium 2.8 (*)    Chloride 95 (*)    Glucose, Bld 436 (*)    Calcium, Ion 1.11 (*)    Hemoglobin 11.2 (*)    HCT 33.0 (*)    All other components within normal limits  CULTURE, BLOOD (ROUTINE X 2)  CULTURE, BLOOD (ROUTINE X 2)  AEROBIC CULTURE (SUPERFICIAL SPECIMEN)  PROTIME-INR  I-STAT TROPOININ, ED  I-STAT TROPOININ, ED  I-STAT CG4 LACTIC ACID, ED    EKG  EKG Interpretation  Date/Time:  Sunday Dec 20 2016 13:51:11 EDT Ventricular Rate:  96 PR Interval:    QRS Duration: 101 QT Interval:  358 QTC Calculation: 453 R Axis:   72 Text Interpretation:  Sinus rhythm Left atrial enlargement Anteroseptal infarct, old Nonspecific repol abnormality, lateral leads Baseline wander in lead(s) II III aVF V2 V3 V5 V6 No significant change since last tracing Confirmed by Floyce Bujak  MD, Kymia Simi ([id number]) on 12/20/2016 2:26:30 PM       Radiology Ct Head Wo Contrast  Result Date: 12/20/2016 CLINICAL DATA:  Pain after fall. EXAM: CT HEAD WITHOUT CONTRAST CT CERVICAL SPINE WITHOUT CONTRAST  TECHNIQUE: Multidetector CT imaging of the head and cervical spine was performed following the standard protocol without intravenous contrast. Multiplanar CT image reconstructions of the cervical spine were also generated. COMPARISON:  September 30, 2014 FINDINGS: CT HEAD FINDINGS Brain: No subdural, epidural, or subarachnoid hemorrhages identified. Ventricles and sulci are prominent but stable. Focal encephalomalacia in the posterior left parietal lobe on series 2, image 23 is unchanged and chronic. Mild white matter changes. No acute cortical ischemia or infarct identified. No mass, mass effect, or midline shift. Vascular: Calcified atherosclerosis is seen in the intracranial carotid arteries. Skull: Normal. Negative for fracture or focal lesion. Sinuses/Orbits: No acute finding. Other: A focus of air in the right anterior soft tissues on series 2, image 317 could be posttraumatic or air within a vessel. No significant soft tissue swelling is seen in the extracranial soft tissues. The orbits are intact. CT CERVICAL SPINE FINDINGS Alignment: Normal. Skull base and vertebrae: No acute fracture. No primary bone lesion or focal pathologic process. Soft tissues and spinal canal: No prevertebral fluid or swelling. No visible canal hematoma. Disc levels:  No significant degenerative changes. Upper chest: Negative. Other: No other abnormalities are identified. IMPRESSION: 1. No acute intracranial abnormality. 2. No fracture or traumatic malalignment in the cervical spine. Electronically Signed   By: Dorise Bullion III M.D   On: 12/20/2016 16:10   Ct Chest W Contrast  Result Date: 12/20/2016 CLINICAL DATA:  Fall, chest bruising/swelling, left humerus fracture EXAM: CT CHEST WITH CONTRAST TECHNIQUE: Multidetector CT imaging of the chest was performed during intravenous contrast administration. CONTRAST:  75 mL Isovue 300 IV COMPARISON:  Chest radiographs dated 12/02/2016. Left humerus radiographs dated 12/20/2016. CT  chest dated 11/15/2015. FINDINGS: Cardiovascular: Mild cardiomegaly.  No pericardial  effusion. Coronary atherosclerosis of the LAD. No evidence of thoracic aortic aneurysm. Very mild atherosclerotic calcifications. Mediastinum/Nodes: No suspicious mediastinal lymphadenopathy. Fluid along the mid/ distal esophagus. Visualized left thyroid is notable for a 10 mm nodule. Lungs/Pleura: Evaluation lung parenchyma is mildly constrained by respiratory motion. Mild atelectasis in the bilateral lower lobes. No suspicious pulmonary nodules. No focal consolidation. Small left pleural effusion. No pneumothorax. Upper Abdomen: Visualized upper abdomen is unremarkable. Musculoskeletal: Subcutaneous cystic lesion along the right anterior chest wall measuring 3.1 x 4.3 cm (series 2/image 116), grossly unchanged from 2017, likely reflecting a benign sebaceous cyst. Additional subcutaneous cystic lesion along the left anterior chest wall measuring 2.8 x 5.8 cm (series 2/image 110) and is notable for a mildly thickened rim and at least one septation (series 2/ image 111). This previously measured 1.6 cm in 2017, when the appearance was compatible with a sebaceous cyst. Given the clinical history, superimposed hematoma/seroma is possible. Mild degenerative changes of the thoracic spine. No evidence of sternal fracture. Comminuted left proximal humeral fracture (series 2/ image 33), incompletely visualized. IMPRESSION: Comminuted left proximal humeral fracture, incompletely visualized. 5.8 cm subcutaneous cystic lesion along the left anterior chest wall, possibly reflecting hematoma/ seroma superimposed upon a pre-existing sebaceous cyst. Given interval growth, attention on follow-up is suggested. Small left pleural effusion. Electronically Signed   By: Julian Hy M.D.   On: 12/20/2016 16:10   Ct Cervical Spine Wo Contrast  Result Date: 12/20/2016 CLINICAL DATA:  Pain after fall. EXAM: CT HEAD WITHOUT CONTRAST CT CERVICAL  SPINE WITHOUT CONTRAST TECHNIQUE: Multidetector CT imaging of the head and cervical spine was performed following the standard protocol without intravenous contrast. Multiplanar CT image reconstructions of the cervical spine were also generated. COMPARISON:  September 30, 2014 FINDINGS: CT HEAD FINDINGS Brain: No subdural, epidural, or subarachnoid hemorrhages identified. Ventricles and sulci are prominent but stable. Focal encephalomalacia in the posterior left parietal lobe on series 2, image 23 is unchanged and chronic. Mild white matter changes. No acute cortical ischemia or infarct identified. No mass, mass effect, or midline shift. Vascular: Calcified atherosclerosis is seen in the intracranial carotid arteries. Skull: Normal. Negative for fracture or focal lesion. Sinuses/Orbits: No acute finding. Other: A focus of air in the right anterior soft tissues on series 2, image 317 could be posttraumatic or air within a vessel. No significant soft tissue swelling is seen in the extracranial soft tissues. The orbits are intact. CT CERVICAL SPINE FINDINGS Alignment: Normal. Skull base and vertebrae: No acute fracture. No primary bone lesion or focal pathologic process. Soft tissues and spinal canal: No prevertebral fluid or swelling. No visible canal hematoma. Disc levels:  No significant degenerative changes. Upper chest: Negative. Other: No other abnormalities are identified. IMPRESSION: 1. No acute intracranial abnormality. 2. No fracture or traumatic malalignment in the cervical spine. Electronically Signed   By: Dorise Bullion III M.D   On: 12/20/2016 16:10   Dg Humerus Left  Result Date: 12/20/2016 CLINICAL DATA:  Pain after trauma. EXAM: LEFT HUMERUS - 2+ VIEW COMPARISON:  None. FINDINGS: There is an angulated comminuted fracture of the proximal humerus without obvious humeral head dislocation. If there is concern for humeral head dislocation, recommend dedicated images of the shoulder. IMPRESSION:  Comminuted angulated fracture of the proximal humeral diaphysis/neck. Electronically Signed   By: Dorise Bullion III M.D   On: 12/20/2016 13:08    Procedures Procedures (including critical care time)  INCISION AND DRAINAGE Performed by: Wandra Arthurs Consent: Verbal consent  obtained. Risks and benefits: risks, benefits and alternatives were discussed Type: abscess  Body area: R lower chest  Anesthesia: local infiltration  Incision was made with a scalpel.  Local anesthetic: lidocaine 2% no epinephrine  Anesthetic total: 5 ml  Complexity: complex Blunt dissection to break up loculations  Drainage: purulent, consistent with infected sebaceous cyst  Drainage amount: copious   Packing material: none   Patient tolerance: Patient tolerated the procedure well with no immediate complications.  INCISION AND DRAINAGE Performed by: Wandra Arthurs Consent: Verbal consent obtained. Risks and benefits: risks, benefits and alternatives were discussed Type: abscess  Body area: L lower chest  Anesthesia: local infiltration  Incision was made with a scalpel.  Local anesthetic: lidocaine 2 % no epinephrine  Anesthetic total: 5 ml  Complexity: complex Blunt dissection to break up loculations  Drainage: purulent  Drainage amount: copious  Packing material: 1none   Patient tolerance: Patient tolerated the procedure well with no immediate complications.       Medications Ordered in ED Medications  potassium chloride 10 mEq in 100 mL IVPB (10 mEq Intravenous New Bag/Given 12/20/16 1422)  hydrALAZINE (APRESOLINE) injection 10 mg (not administered)  lidocaine (XYLOCAINE) 2 % (with pres) injection 200 mg (not administered)  clindamycin (CLEOCIN) IVPB 600 mg (not administered)  HYDROmorphone (DILAUDID) injection 1 mg (1 mg Intravenous Given 12/20/16 1326)  losartan (COZAAR) tablet 100 mg (100 mg Oral Given 12/20/16 1412)  carvedilol (COREG) tablet 25 mg (25 mg Oral Given 12/20/16  1412)  iopamidol (ISOVUE-300) 61 % injection (75 mLs Intravenous Contrast Given 12/20/16 1433)  potassium chloride SA (K-DUR,KLOR-CON) CR tablet 40 mEq (40 mEq Oral Given 12/20/16 1416)  sodium chloride 0.9 % bolus 1,000 mL (1,000 mLs Intravenous New Bag/Given 12/20/16 1457)  insulin aspart (novoLOG) injection 10 Units (10 Units Subcutaneous Given 12/20/16 1456)  ondansetron (ZOFRAN) injection 4 mg (4 mg Intravenous Given 12/20/16 1456)  HYDROmorphone (DILAUDID) injection 1 mg (1 mg Intravenous Given 12/20/16 1504)     Initial Impression / Assessment and Plan / ED Course  I have reviewed the triage vital signs and the nursing notes.  Pertinent labs & imaging results that were available during my care of the patient were reviewed by me and considered in my medical decision making (see chart for details).     Maryana Pittmon is a 61 y.o. female hx of uncompliance, here with fall. Mechanical fall, has head injury and L proximal arm injury. Hypertensive in the ED, glucose 450. Concerned for head bleed vs uncontrolled diabetes. Will get labs, CT head/neck, xrays. Also has chest swelling so will get CT chest to r/o abscess vs mass.   4:58 PM CT showed possible cyst. Bedside US showed bilateral abscess. I and d performed and R lower chest appears to be infected cyst and L lower chest appears to be abscess. Wound culture sent. Still hypertensive despite BP meds. Given 1 L NS bolus and 10 U insulin and glucose still 400. K 2.6, supplemented. Has L proximal humerus fracture. Called Dr. Stann Mainland, who will follow, recommend shoulder immobilizer. Will admit for abscess, uncontrolled hypertension, hyperglycemia.       Final Clinical Impressions(s) / ED Diagnoses   Final diagnoses:  None    New Prescriptions New Prescriptions   No medications on file     Drenda Freeze, MD 12/20/16 1700

## 2016-12-20 NOTE — ED Notes (Signed)
Patient transported to CT 

## 2016-12-20 NOTE — Progress Notes (Signed)
Admit date: 12/20/2016                    Expected: 5/17 Discharge Date:                                                                                                                                                                    PMH: a.fib , HTN, GERD, CAD, DM  Chief Complaint: Hyperglycemia [R73.9] Hypertensive urgency [I16.0] Chest wall abscess [L02.213] Closed fracture of proximal end of left humerus, unspecified fracture morphology, initial encounter [S42.202A]  Abnormal Labs/ VS:  Pertinent Test Results:  Diet:  Diet regular Room service appropriate? Yes; Fluid consistency: Thin ______________________________________________________________________  PT Recommendations/Equipment:   ,    Disposition From: Home with husband   Disposition To:   If Home with [facility], is it ordered?  Is equipment needed & ordered?  Is O2 needed & ordered?  Transportation at time of discharge: ______________________________________________________________________  PLAN OF CARE: IV antibiotics and lasix , shoulder immobilizer  Barriers to Discharge: ______________________________________________________________________  PRN's:  Nursing Comments:

## 2016-12-20 NOTE — Progress Notes (Signed)
Called from lab, Mg level low, supplement via IV route, check Mg in AM.   Faye Ramsay, MD  [facility] Pager [phone]  If 7PM-7AM, please contact night-coverage [email or web address] Password [email or web address]

## 2016-12-20 NOTE — Progress Notes (Signed)
Patient arrived in stable condition. VS stable A&O x 4, lethargic but easily arousable. 1+ edema BLE. Oriented patient and husband to room.

## 2016-12-20 NOTE — Progress Notes (Signed)
I have reviewed the xrays and CT scan for this patient.  Will recommend trial of non operative management of her left proximal humerus fracture in a sling.  She should be non weight bearing to the left arm.  Full consult note to follow.  [place]

## 2016-12-20 NOTE — Progress Notes (Signed)
ANTICOAGULATION CONSULT NOTE - Initial Consult  Pharmacy Consult for Warfarin Indication: AFib  Allergies  Allergen Reactions  . Ace Inhibitors Cough    Patient Measurements: Height: 5\' 6"  (167.6 cm) Weight: 165 lb (74.8 kg) IBW/kg (Calculated) : 59.3  Vital Signs: Temp: 98.6 F (37 C) (05/13 1227) Temp Source: Oral (05/13 1227) BP: 118/76 (05/13 1753) Pulse Rate: 61 (05/13 1753)  Labs:  Recent Labs  12/20/16 1320 12/20/16 1332 12/20/16 1605  HGB 11.4*  --  11.2*  HCT 33.9*  --  33.0*  PLT 300  --   --   LABPROT  --  13.8  --   INR  --  1.06  --   CREATININE 0.94  --  0.80    Estimated Creatinine Clearance: 77.3 mL/min (by C-G formula based on SCr of 0.8 mg/dL).   Medical History: Past Medical History:  Diagnosis Date  . Anemia   . CHF (congestive heart failure) ([place])   . Coronary artery disease   . GERD (gastroesophageal reflux disease)   . Heart murmur   . Hyperlipidemia   . Hypertension   . Stroke ([place]) 09/2014   numbness left upper lip, finger tips on left hand; "resolved" (05/18/2016)  . Type II diabetes mellitus ([facility])     Assessment:  1yr female with PMH significant for HTN, DM, CHF, AFib and CAD.  Presents to ED s/p fall.  On warfarin PTA.    Pt sustained left humerus fracture; no surgical intervention at this time planned.  Patient noted to be noncompliant with medications.   PTA patient on warfarin 5mg  daily with LD unknown but reports sometime in the last 7 days.  INR upon admission = 1.06  Pharmacy consulted to continue dosing Warfarin  Goal of Therapy:  INR 2-3   Plan:   Warfarin 7.5mg  po x 1 dose tonight (1.5x home dose per protocol due to subtherapeutic INR)  Check daily INR  Lulie Hurd, Toribio Harbour, PharmD 12/20/2016,6:30 PM

## 2016-12-20 NOTE — ED Triage Notes (Signed)
Per [facility] pt has mechanical fall, knees gave out of her. C/o left arm pain. 200 mcg fent given en route. No LOC but patient unsure if hit head.

## 2016-12-20 NOTE — ED Notes (Signed)
Bed: WA10 Expected date: 12/20/16 Expected time: 12:16 PM Means of arrival:  Comments: Fall Left upper arm pain

## 2016-12-21 ENCOUNTER — Encounter (HOSPITAL_COMMUNITY): Payer: Self-pay

## 2016-12-21 DIAGNOSIS — I160 Hypertensive urgency: Secondary | ICD-10-CM

## 2016-12-21 LAB — CBC
HEMATOCRIT: 30.9 % — AB (ref 36.0–46.0)
HEMOGLOBIN: 10.2 g/dL — AB (ref 12.0–15.0)
MCH: 24.3 pg — AB (ref 26.0–34.0)
MCHC: 33 g/dL (ref 30.0–36.0)
MCV: 73.7 fL — AB (ref 78.0–100.0)
PLATELETS: 275 10*3/uL (ref 150–400)
RBC: 4.19 MIL/uL (ref 3.87–5.11)
RDW: 15.7 % — ABNORMAL HIGH (ref 11.5–15.5)
WBC: 8.1 10*3/uL (ref 4.0–10.5)

## 2016-12-21 LAB — GLUCOSE, CAPILLARY
GLUCOSE-CAPILLARY: 309 mg/dL — AB (ref 65–99)
GLUCOSE-CAPILLARY: 213 mg/dL — AB (ref 65–99)
GLUCOSE-CAPILLARY: 163 mg/dL — AB (ref 65–99)
Glucose-Capillary: 334 mg/dL — ABNORMAL HIGH (ref 65–99)

## 2016-12-21 LAB — BASIC METABOLIC PANEL
ANION GAP: 11 (ref 5–15)
BUN: 16 mg/dL (ref 6–20)
CHLORIDE: 95 mmol/L — AB (ref 101–111)
CO2: 26 mmol/L (ref 22–32)
Calcium: 8.3 mg/dL — ABNORMAL LOW (ref 8.9–10.3)
Creatinine, Ser: 1.51 mg/dL — ABNORMAL HIGH (ref 0.44–1.00)
GFR calc Af Amer: 42 mL/min — ABNORMAL LOW (ref >60)
GFR, EST NON AFRICAN AMERICAN: 36 mL/min — AB (ref >60)
GLUCOSE: 363 mg/dL — AB (ref 65–99)
POTASSIUM: 3.8 mmol/L (ref 3.5–5.1)
Sodium: 132 mmol/L — ABNORMAL LOW (ref 135–145)

## 2016-12-21 LAB — PROTIME-INR
INR: 1.12
PROTHROMBIN TIME: 14.4 s (ref 11.4–15.2)

## 2016-12-21 LAB — HIV ANTIBODY (ROUTINE TESTING W REFLEX): HIV Screen 4th Generation wRfx: NONREACTIVE

## 2016-12-21 LAB — MAGNESIUM: Magnesium: 2.1 mg/dL (ref 1.7–2.4)

## 2016-12-21 NOTE — Evaluation (Signed)
Occupational Therapy Evaluation Patient Details Name: Amber Young MRN: [id number] DOB: [birth year] Today's Date: 12/21/2016    History of Present Illness Amber Young is a 61 y.o. female with known HTN, GERD, CAD, DM, a-fin on coumadin, presented to [facility] ED after an episode of fall backwards and fell on her left arm. Proximal humeral diaphysis/neck fracture.   Clinical Impression   Pt admitted with fall with a humeral fracture. Pt currently with functional limitations due to the deficits listed below (see OT Problem List).  Pt will benefit from skilled OT to increase their safety and independence with ADL and functional mobility for ADL to facilitate discharge to venue listed below.      Follow Up Recommendations  SNF    Equipment Recommendations  None recommended by OT    Recommendations for Other Services       Precautions / Restrictions Precautions Precautions: Fall Required Braces or Orthoses: Sling Restrictions Weight Bearing Restrictions: Yes LUE Weight Bearing: Non weight bearing      Mobility Bed Mobility Overal bed mobility: Needs Assistance Bed Mobility: Supine to Sit     Supine to sit: Max assist Sit to supine: Max assist   General bed mobility comments: much cueing and stimulation  to arouse enough to  sitiat the bed edge.   Transfers                 General transfer comment: did not perform    Balance Overall balance assessment: History of Falls;Needs assistance Sitting-balance support: Feet supported;Single extremity supported Sitting balance-Leahy Scale: Poor Sitting balance - Comments: due to falling asleep                                   ADL either performed or assessed with clinical judgement   ADL Overall ADL's : Needs assistance/impaired Eating/Feeding: Moderate assistance;Bed level   Grooming: Moderate assistance;Bed level           Upper Body Dressing : Total assistance;Cueing for safety;Standing                      General ADL Comments: pt currently max- total A with ADL activity due to pain and lethargy     Vision Patient Visual Report: No change from baseline              Pertinent Vitals/Pain Pain Assessment: Faces Faces Pain Scale: Hurts whole lot Pain Location: L shoulder Pain Descriptors / Indicators: Sore;Grimacing Pain Intervention(s): Monitored during session;Repositioned;Limited activity within patient's tolerance;Patient requesting pain meds-RN notified        Extremity/Trunk Assessment Upper Extremity Assessment Upper Extremity Assessment: LUE deficits/detail LUE Deficits / Details: Pts sling behind her back. OT repositioned and placed LUE in sling correctly. Pt grimaced with movement of arm. Pt did attempt to A OT and verbalized understanding of sling placement   Lower Extremity Assessment Lower Extremity Assessment: Generalized weakness   Cervical / Trunk Assessment Cervical / Trunk Assessment: Normal   Communication Communication Communication: No difficulties   Cognition Arousal/Alertness: Lethargic Behavior During Therapy: WFL for tasks assessed/performed Overall Cognitive Status: No family/caregiver present to determine baseline cognitive functioning                                 General Comments: patient does not stay aroused to participate in answers. per RN , had 2 pain  pills              Home Living Family/patient expects to be discharged to:: Private residence Living Arrangements: Spouse/significant other Available Help at Discharge: Family Type of Home: [place]: One level     Bathroom Shower/Tub: Tub/shower unit         Home Equipment: None   Additional Comments: patient unable to provide information , lethargic      Prior Functioning/Environment                   OT Problem List: Decreased strength;Decreased activity tolerance;Decreased knowledge of use of DME or  AE;Impaired UE functional use;Impaired balance (sitting and/or standing)      OT Treatment/Interventions: Self-care/ADL training;DME and/or AE instruction;Patient/family education    OT Goals(Current goals can be found in the care plan section) Acute Rehab OT Goals Patient Stated Goal: get home with husband OT Goal Formulation: With patient Time For Goal Achievement: 01/04/17 Potential to Achieve Goals: Good  OT Frequency: Min 2X/week   Barriers to D/C:            Co-evaluation              AM-PAC PT "6 Clicks" Daily Activity     Outcome Measure Help from another person eating meals?: A Lot Help from another person taking care of personal grooming?: A Lot Help from another person toileting, which includes using toliet, bedpan, or urinal?: Total Help from another person bathing (including washing, rinsing, drying)?: Total Help from another person to put on and taking off regular upper body clothing?: Total Help from another person to put on and taking off regular lower body clothing?: Total 6 Click Score: 8   End of Session Nurse Communication: Mobility status  Activity Tolerance: Patient limited by fatigue Patient left: in bed;with call bell/phone within reach;with bed alarm set  OT Visit Diagnosis: Muscle weakness (generalized) (M62.81);History of falling (Z91.81)                Time: 1425-1440 OT Time Calculation (min): 15 min Charges:  OT General Charges $OT Visit: 1 Procedure OT Evaluation $OT Eval Moderate Complexity: 1 Procedure G-Codes:     Kari Baars, [place]  Payton Mccallum D 12/21/2016, 3:49 PM

## 2016-12-21 NOTE — Consult Note (Signed)
ORTHOPAEDIC CONSULTATION  REQUESTING PHYSICIAN: Arrien, Jimmy Picket  PCP:  [facility]  Chief Complaint: left shoulder pain  HPI: Amber Young is a 61 y.o. female who complains of left shoulder pain following a fall at church yesterday.  She has a somewhat complicated medical hx including PMHx sign for GERD, DM, CAD, HTN and cardiomyopathy.  She is on coumadin.  She is RHD and retired.  She denies numbness or tingling at this time and was independent with her ADLs prior to this fall yesterday.  She denies any associated symptoms in the right arm.  No numbness or tingling.  She did also have a breast abscess I and D'd in the ED.    Past Medical History:  Diagnosis Date  . Anemia   . CHF (congestive heart failure) ([place])   . Coronary artery disease   . GERD (gastroesophageal reflux disease)   . Heart murmur   . Hyperlipidemia   . Hypertension   . Stroke ([place]) 09/2014   numbness left upper lip, finger tips on left hand; "resolved" (05/18/2016)  . Type II diabetes mellitus ([place])    Past Surgical History:  Procedure Laterality Date  . CARDIAC CATHETERIZATION N/A 05/18/2016   Procedure: Left Heart Cath and Coronary Angiography;  Surgeon: Jettie Booze, MD;  Location: [facility];  Service: Cardiovascular;  Laterality: N/A;  . CARDIAC CATHETERIZATION N/A 05/18/2016   Procedure: Coronary Stent Intervention;  Surgeon: Jettie Booze, MD;  Location: [facility];  Service: Cardiovascular;  Laterality: N/A;  . CORONARY ANGIOPLASTY    . TUBAL LIGATION  1980  . VAGINAL HYSTERECTOMY  1999   "partial; fibroids"   Social History   Social History  . Marital status: Married    Spouse name: N/A  . Number of children: N/A  . Years of education: 50   Occupational History  . hairdresser    Social History Main Topics  . Smoking status: Never Smoker  . Smokeless tobacco: Never Used  . Alcohol use No  . Drug use: No  . Sexual  activity: Yes   Other Topics Concern  . None   Social History Narrative   Married, 2 children, hairdresser   Caffeine use- occasional tea or coffee   Right handed      Family History  Problem Relation Age of Onset  . Diabetes Mother   . Hypertension Mother   . COPD Mother   . Heart failure Mother   . Hypertension Sister   . Hypertension Brother    Allergies  Allergen Reactions  . Ace Inhibitors Cough   Prior to Admission medications   Medication Sig Start Date End Date Taking? Authorizing Provider  furosemide (LASIX) 40 MG tablet Take 1 tablet (40 mg total) by mouth 2 (two) times daily. 02/14/16  Yes Herminio Commons, MD  insulin detemir (LEVEMIR) 100 UNIT/ML injection Inject 0.09 mLs (9 Units total) into the skin daily. 04/30/16  Yes Eber Jones, MD  losartan (COZAAR) 100 MG tablet Take 1 tablet (100 mg total) by mouth daily. 05/20/16  Yes Reino Bellis B, NP  warfarin (COUMADIN) 5 MG tablet Take 1 tablet (5 mg total) by mouth daily at 6 PM. 05/20/16  Yes Cheryln Manly, NP  carvedilol (COREG) 25 MG tablet Take 1 tablet (25 mg total) by mouth 2 (two) times daily. Patient not taking: Reported on 12/20/2016 09/08/16 12/07/16  Herminio Commons, MD  Glucose Blood (BLOOD GLUCOSE TEST STRIPS) STRP Use  as directed 11/17/15   Kathie Dike, MD  Insulin Pen Needle 29G X 10MM MISC Use as directed 11/17/15   Kathie Dike, MD  potassium chloride SA (K-DUR,KLOR-CON) 20 MEQ tablet Take 2 tablets (40 mEq total) by mouth 2 (two) times daily. Patient not taking: Reported on 12/20/2016 05/14/16   Herminio Commons, MD  ticagrelor (BRILINTA) 90 MG TABS tablet Take 1 tablet (90 mg total) by mouth 2 (two) times daily. 05/20/16   Cheryln Manly, NP   Ct Head Wo Contrast  Result Date: 12/20/2016 CLINICAL DATA:  Pain after fall. EXAM: CT HEAD WITHOUT CONTRAST CT CERVICAL SPINE WITHOUT CONTRAST TECHNIQUE: Multidetector CT imaging of the head and cervical spine was performed  following the standard protocol without intravenous contrast. Multiplanar CT image reconstructions of the cervical spine were also generated. COMPARISON:  September 30, 2014 FINDINGS: CT HEAD FINDINGS Brain: No subdural, epidural, or subarachnoid hemorrhages identified. Ventricles and sulci are prominent but stable. Focal encephalomalacia in the posterior left parietal lobe on series 2, image 23 is unchanged and chronic. Mild white matter changes. No acute cortical ischemia or infarct identified. No mass, mass effect, or midline shift. Vascular: Calcified atherosclerosis is seen in the intracranial carotid arteries. Skull: Normal. Negative for fracture or focal lesion. Sinuses/Orbits: No acute finding. Other: A focus of air in the right anterior soft tissues on series 2, image 317 could be posttraumatic or air within a vessel. No significant soft tissue swelling is seen in the extracranial soft tissues. The orbits are intact. CT CERVICAL SPINE FINDINGS Alignment: Normal. Skull base and vertebrae: No acute fracture. No primary bone lesion or focal pathologic process. Soft tissues and spinal canal: No prevertebral fluid or swelling. No visible canal hematoma. Disc levels:  No significant degenerative changes. Upper chest: Negative. Other: No other abnormalities are identified. IMPRESSION: 1. No acute intracranial abnormality. 2. No fracture or traumatic malalignment in the cervical spine. Electronically Signed   By: Dorise Bullion III M.D   On: 12/20/2016 16:10   Ct Chest W Contrast  Result Date: 12/20/2016 CLINICAL DATA:  Fall, chest bruising/swelling, left humerus fracture EXAM: CT CHEST WITH CONTRAST TECHNIQUE: Multidetector CT imaging of the chest was performed during intravenous contrast administration. CONTRAST:  75 mL Isovue 300 IV COMPARISON:  Chest radiographs dated 12/02/2016. Left humerus radiographs dated 12/20/2016. CT chest dated 11/15/2015. FINDINGS: Cardiovascular: Mild cardiomegaly.  No  pericardial effusion. Coronary atherosclerosis of the LAD. No evidence of thoracic aortic aneurysm. Very mild atherosclerotic calcifications. Mediastinum/Nodes: No suspicious mediastinal lymphadenopathy. Fluid along the mid/ distal esophagus. Visualized left thyroid is notable for a 10 mm nodule. Lungs/Pleura: Evaluation lung parenchyma is mildly constrained by respiratory motion. Mild atelectasis in the bilateral lower lobes. No suspicious pulmonary nodules. No focal consolidation. Small left pleural effusion. No pneumothorax. Upper Abdomen: Visualized upper abdomen is unremarkable. Musculoskeletal: Subcutaneous cystic lesion along the right anterior chest wall measuring 3.1 x 4.3 cm (series 2/image 116), grossly unchanged from 2017, likely reflecting a benign sebaceous cyst. Additional subcutaneous cystic lesion along the left anterior chest wall measuring 2.8 x 5.8 cm (series 2/image 110) and is notable for a mildly thickened rim and at least one septation (series 2/ image 111). This previously measured 1.6 cm in 2017, when the appearance was compatible with a sebaceous cyst. Given the clinical history, superimposed hematoma/seroma is possible. Mild degenerative changes of the thoracic spine. No evidence of sternal fracture. Comminuted left proximal humeral fracture (series 2/ image 33), incompletely visualized. IMPRESSION: Comminuted left proximal humeral  fracture, incompletely visualized. 5.8 cm subcutaneous cystic lesion along the left anterior chest wall, possibly reflecting hematoma/ seroma superimposed upon a pre-existing sebaceous cyst. Given interval growth, attention on follow-up is suggested. Small left pleural effusion. Electronically Signed   By: Julian Hy M.D.   On: 12/20/2016 16:10   Ct Cervical Spine Wo Contrast  Result Date: 12/20/2016 CLINICAL DATA:  Pain after fall. EXAM: CT HEAD WITHOUT CONTRAST CT CERVICAL SPINE WITHOUT CONTRAST TECHNIQUE: Multidetector CT imaging of the head and  cervical spine was performed following the standard protocol without intravenous contrast. Multiplanar CT image reconstructions of the cervical spine were also generated. COMPARISON:  September 30, 2014 FINDINGS: CT HEAD FINDINGS Brain: No subdural, epidural, or subarachnoid hemorrhages identified. Ventricles and sulci are prominent but stable. Focal encephalomalacia in the posterior left parietal lobe on series 2, image 23 is unchanged and chronic. Mild white matter changes. No acute cortical ischemia or infarct identified. No mass, mass effect, or midline shift. Vascular: Calcified atherosclerosis is seen in the intracranial carotid arteries. Skull: Normal. Negative for fracture or focal lesion. Sinuses/Orbits: No acute finding. Other: A focus of air in the right anterior soft tissues on series 2, image 317 could be posttraumatic or air within a vessel. No significant soft tissue swelling is seen in the extracranial soft tissues. The orbits are intact. CT CERVICAL SPINE FINDINGS Alignment: Normal. Skull base and vertebrae: No acute fracture. No primary bone lesion or focal pathologic process. Soft tissues and spinal canal: No prevertebral fluid or swelling. No visible canal hematoma. Disc levels:  No significant degenerative changes. Upper chest: Negative. Other: No other abnormalities are identified. IMPRESSION: 1. No acute intracranial abnormality. 2. No fracture or traumatic malalignment in the cervical spine. Electronically Signed   By: Dorise Bullion III M.D   On: 12/20/2016 16:10   Dg Humerus Left  Result Date: 12/20/2016 CLINICAL DATA:  Pain after trauma. EXAM: LEFT HUMERUS - 2+ VIEW COMPARISON:  None. FINDINGS: There is an angulated comminuted fracture of the proximal humerus without obvious humeral head dislocation. If there is concern for humeral head dislocation, recommend dedicated images of the shoulder. IMPRESSION: Comminuted angulated fracture of the proximal humeral diaphysis/neck.  Electronically Signed   By: Dorise Bullion III M.D   On: 12/20/2016 13:08    Positive ROS: All other systems have been reviewed and were otherwise negative with the exception of those mentioned in the HPI and as above.  Physical Exam: General: somnolent, but arrousable, no acute distress Cardiovascular: No pedal edema Respiratory: No cyanosis, no use of accessory musculature GI: No organomegaly, abdomen is soft and non-tender Skin: No lesions in the area of chief complaint Neurologic: Sensation intact distally Psychiatric: Patient is competent for consent with normal mood and affect Lymphatic: No cervical lymphadenopathy  MUSCULOSKELETAL:  LUE- sling in place, TTP along proximal humerus, no obvious deformity, with normal skin contours noted.  Endorses SILT ax/MC/LABC/med/rad/uln, motor intact MC/ax/pin/ain/med/uln/rad.  2+ rad pulse  Assessment: Left proximal humerus fracture with minimal angulation  Plan: -NWB to LUE -sling for comfort -ok to remove sling for full AROM and PROM at elbow and hand/wrist -will plan for non operative management with follow up xrays perfomed in 2 weeks -she can follow up with me in the office for this -will sign off, call with questions.    Nicholes Stairs, MD Cell [phone]    12/21/2016 6:36 PM

## 2016-12-21 NOTE — Evaluation (Signed)
Physical Therapy Evaluation Patient Details Name: Amber Young MRN: [id number] DOB: [birth year] Today's Date: 12/21/2016   History of Present Illness  Amber Young is a 61 y.o. female with known HTN, GERD, CAD, DM, a-fib , presented to [facility] ED 5/13 after an episode of fall backwards and fell on her left arm. Proximal humeral diaphysis/neck fracture. Treat with sling.  Clinical Impression  The patient is too lethargic to participate beyond sitting at the bed edge. The PT had to physically hold her upright. No family present for information. Pt admitted with above diagnosis. Pt currently with functional limitations due to the deficits listed below (see PT Problem List). * Pt will benefit from skilled PT to increase their independence and safety with mobility to allow discharge to the venue listed below.       Follow Up Recommendations SNF;Home health PT (depends on progress)    Equipment Recommendations   (tba)    Recommendations for Other Services       Precautions / Restrictions Precautions Precautions: Fall Required Braces or Orthoses: Sling Restrictions Weight Bearing Restrictions: Yes LUE Weight Bearing: Non weight bearing      Mobility  Bed Mobility Overal bed mobility: Needs Assistance Bed Mobility: Supine to Sit;Sit to Supine     Supine to sit: Mod assist Sit to supine: Mod assist   General bed mobility comments: much cueing and stimulation  to arouse enough to  sitiat the bed edge.   Transfers                 General transfer comment: patient unable to attempt due to lethargy  Ambulation/Gait                Stairs            Wheelchair Mobility    Modified Rankin (Stroke Patients Only)       Balance Overall balance assessment: History of Falls;Needs assistance Sitting-balance support: Feet supported;Single extremity supported Sitting balance-Leahy Scale: Poor Sitting balance - Comments: due to falling asleep                                     Pertinent Vitals/Pain Pain Assessment: Faces Faces Pain Scale: Hurts even more Pain Location: indicates left arm Pain Descriptors / Indicators: Moaning    Home Living Family/patient expects to be discharged to:: Private residence Living Arrangements: Spouse/significant other Available Help at Discharge: Family Type of Home: House           Additional Comments: patient unable to provide information , lethargic    Prior Function                 Hand Dominance        Extremity/Trunk Assessment   Upper Extremity Assessment Upper Extremity Assessment: Defer to OT evaluation    Lower Extremity Assessment Lower Extremity Assessment: Generalized weakness    Cervical / Trunk Assessment Cervical / Trunk Assessment: Normal  Communication      Cognition Arousal/Alertness: Suspect due to medications;Lethargic                                     General Comments: patient does not stay aroused to participate in answers. per RN , had 2 pain pills      General Comments      Exercises  Assessment/Plan    PT Assessment Patient needs continued PT services  PT Problem List Decreased strength;Decreased activity tolerance;Decreased balance;Decreased mobility;Decreased knowledge of precautions;Decreased safety awareness;Decreased knowledge of use of DME;Decreased cognition       PT Treatment Interventions DME instruction;Functional mobility training;Therapeutic activities;Patient/family education;Balance training;Cognitive remediation    PT Goals (Current goals can be found in the Care Plan section)  Acute Rehab PT Goals PT Goal Formulation: Patient unable to participate in goal setting Time For Goal Achievement: 01/04/17 Potential to Achieve Goals: Fair    Frequency Min 3X/week   Barriers to discharge        Co-evaluation               AM-PAC PT "6 Clicks" Daily Activity  Outcome Measure Difficulty  turning over in bed (including adjusting bedclothes, sheets and blankets)?: A Lot Difficulty moving from lying on back to sitting on the side of the bed? : A Lot Difficulty sitting down on and standing up from a chair with arms (e.g., wheelchair, bedside commode, etc,.)?: A Lot Help needed moving to and from a bed to chair (including a wheelchair)?: A Lot Help needed walking in hospital room?: Total Help needed climbing 3-5 steps with a railing? : Total 6 Click Score: 10    End of Session   Activity Tolerance: Patient limited by lethargy Patient left: in bed;with call bell/phone within reach;with bed alarm set Nurse Communication: Mobility status PT Visit Diagnosis: Difficulty in walking, not elsewhere classified (R26.2)    Time: 2409-7353 PT Time Calculation (min) (ACUTE ONLY): 15 min   Charges:   PT Evaluation $PT Eval Low Complexity: 1 Procedure     PT G CodesTresa Endo PT [place] 12/21/2016, 1:44 PM

## 2016-12-21 NOTE — Progress Notes (Signed)
PROGRESS NOTE    Amber Young  GEZ:[id number] DOB: June 18, 1956 DOA: 12/20/2016 PCP: [facility]    Brief Narrative:  61 yo female presented with the chief complain of fall and left arm pain. Patient known to have HTN, T2DM and atrial fibrillation on anticoagulation. Sustained a mechanical fall while walking, no loss of consciousness. Post trauma, severe left arm pain. On the initial physical examination, blood pressure was 204/112, heart rate 95 with respiratory rate of 18 and oxygen saturation of 98%. Mucous membranes were moist, lungs clear to auscultation, heart with S1-S2 irregular rhythm Soft abdomen with no lower extremity edema. Positive erythematous rash under the breast. Arm films with proximal humeral fracture. Patient admitted with right humeral fracture complicated by breast cellulitis/ abscess.    Assessment & Plan:   Active Problems:   Breast abscess   1. Proximal humeral fracture. Will continue pain control and immobilization, not candidate for surgical intervention per orthopedic recommendations.   2. Breast abscess with cellulitis. No erythema or local tenderness, no leukocytosis or fevers, CT chest describes a cystic lesion 2,8 x 5.8 cm. No signs of local infection and no clinical symptoms of systemic infection, will hold on antibiotic therapy.   3. Hypokalemia. Serum K at 3,8, will follow renal panel in am. Hold furosemide.   4. HTN. Will continue blood pressure control with coreg, losartan.  5. Atrial fibrillation. Will continue rate control coreg, will continue warfarin daily.   6. T2DM. Will continue iss for glucose cover and monitoring, capillary glucose [phone]. Basal insulin with 9 units of levimir.   7. Heart failure. Echocardiogram from 04/2016 with EF 45%, patient euvolemic will hold on furosemide, blood pressure control, no signs of decompensation.     8. AKI. Serum cr up to 1,51 from 0,80, will hold on diuresis and nephrotoxic  medications, follow on renal panel in am.     DVT prophylaxis: warfarin  Code Status: Full  Family Communication:  Disposition Plan: Home    Consultants:   Orthopedics   Procedures:   Breast abscess I&D (ED)   Antimicrobials:  Clindamycin.     Subjective: Left arm pain stable, worse with movement, no radiation, improved with immobilization. Left breast with abscess that has been not completely drain, tender to palpation.   Objective: Vitals:   12/20/16 1833 12/20/16 2240 12/21/16 0624 12/21/16 0712  BP: 118/60 (!) 174/97 127/69   Pulse: 61 80 (!) 59   Resp: 16 18 18    Temp: 98.6 F (37 C) 98.1 F (36.7 C) 97.9 F (36.6 C)   TempSrc: Oral Oral Oral   SpO2: 99% 100% 99%   Weight:    80.8 kg (178 lb 2.1 oz)  Height:        Intake/Output Summary (Last 24 hours) at 12/21/16 0836 Last data filed at 12/20/16 2300  Gross per 24 hour  Intake             1695 ml  Output              250 ml  Net             1445 ml   Filed Weights   12/20/16 1229 12/21/16 0712  Weight: 74.8 kg (165 lb) 80.8 kg (178 lb 2.1 oz)    Examination:  General exam: not in pain or dyspnea E ENT: no pallor or icterus, oral mucosa moist. Respiratory system: Clear to auscultation. Respiratory effort normal. No wheezing, rales or rhonchi.  Cardiovascular system:  S1 & S2 heard, RRR. No JVD, murmurs, rubs, gallops or clicks. No pedal edema. Gastrointestinal system: Abdomen is nondistended, soft and nontender. No organomegaly or masses felt. Normal bowel sounds heard. Central nervous system: Alert and oriented. No focal neurological deficits. Extremities: Symmetric 5 x 5 power. Skin: nodular, indurated mass at the parasternal line, about 4 to 5 cm in diameter, round in shape. Tender to palpation, no drainage.     Data Reviewed: I have personally reviewed following labs and imaging studies  CBC:  Recent Labs Lab 12/20/16 1320 12/20/16 1605 12/21/16 0557  WBC 8.4  --  8.1  NEUTROABS  6.7  --   --   HGB 11.4* 11.2* 10.2*  HCT 33.9* 33.0* 30.9*  MCV 73.5*  --  73.7*  PLT 300  --  284   Basic Metabolic Panel:  Recent Labs Lab 12/20/16 1320 12/20/16 1605 12/20/16 1856 12/21/16 0557  NA 136 136  --  132*  K 2.6* 2.8*  --  3.8  CL 98* 95*  --  95*  CO2 26  --   --  26  GLUCOSE 455* 436*  --  363*  BUN 11 10  --  16  CREATININE 0.94 0.80  --  1.51*  CALCIUM 8.2*  --   --  8.3*  MG  --   --  1.4* 2.1  PHOS  --   --  3.7  --    GFR: Estimated Creatinine Clearance: 42.5 mL/min (A) (by C-G formula based on SCr of 1.51 mg/dL (H)). Liver Function Tests:  Recent Labs Lab 12/20/16 1320  AST 20  ALT 15  ALKPHOS 127*  BILITOT 0.9  PROT 6.4*  ALBUMIN 2.5*   No results for input(s): LIPASE, AMYLASE in the last 168 hours. No results for input(s): AMMONIA in the last 168 hours. Coagulation Profile:  Recent Labs Lab 12/20/16 1332 12/21/16 0557  INR 1.06 1.12   Cardiac Enzymes: No results for input(s): CKTOTAL, CKMB, CKMBINDEX, TROPONINI in the last 168 hours. BNP (last 3 results) No results for input(s): PROBNP in the last 8760 hours. HbA1C: No results for input(s): HGBA1C in the last 72 hours. CBG:  Recent Labs Lab 12/20/16 1554 12/20/16 2243 12/21/16 0812  GLUCAP 408* 353* 334*   Lipid Profile: No results for input(s): CHOL, HDL, LDLCALC, TRIG, CHOLHDL, LDLDIRECT in the last 72 hours. Thyroid Function Tests: No results for input(s): TSH, T4TOTAL, FREET4, T3FREE, THYROIDAB in the last 72 hours. Anemia Panel: No results for input(s): VITAMINB12, FOLATE, FERRITIN, TIBC, IRON, RETICCTPCT in the last 72 hours. Sepsis Labs:  Recent Labs Lab 12/20/16 1648  LATICACIDVEN 1.42    Recent Results (from the past 240 hour(s))  Wound or Superficial Culture     Status: None (Preliminary result)   Collection Time: 12/20/16  4:36 PM  Result Value Ref Range Status   Specimen Description ABSCESS  Final   Special Requests NONE  Final   Gram Stain    Final    MODERATE WBC PRESENT, PREDOMINANTLY PMN ABUNDANT GRAM POSITIVE COCCI IN PAIRS IN CHAINS IN CLUSTERS GRAM NEGATIVE RODS Performed at [facility], [place] [address], [place], [place] [address]    Culture PENDING  Incomplete   Report Status PENDING  Incomplete         Radiology Studies: Ct Head Wo Contrast  Result Date: 12/20/2016 CLINICAL DATA:  Pain after fall. EXAM: CT HEAD WITHOUT CONTRAST CT CERVICAL SPINE WITHOUT CONTRAST TECHNIQUE: Multidetector CT imaging of the head and  cervical spine was performed following the standard protocol without intravenous contrast. Multiplanar CT image reconstructions of the cervical spine were also generated. COMPARISON:  September 30, 2014 FINDINGS: CT HEAD FINDINGS Brain: No subdural, epidural, or subarachnoid hemorrhages identified. Ventricles and sulci are prominent but stable. Focal encephalomalacia in the posterior left parietal lobe on series 2, image 23 is unchanged and chronic. Mild white matter changes. No acute cortical ischemia or infarct identified. No mass, mass effect, or midline shift. Vascular: Calcified atherosclerosis is seen in the intracranial carotid arteries. Skull: Normal. Negative for fracture or focal lesion. Sinuses/Orbits: No acute finding. Other: A focus of air in the right anterior soft tissues on series 2, image 317 could be posttraumatic or air within a vessel. No significant soft tissue swelling is seen in the extracranial soft tissues. The orbits are intact. CT CERVICAL SPINE FINDINGS Alignment: Normal. Skull base and vertebrae: No acute fracture. No primary bone lesion or focal pathologic process. Soft tissues and spinal canal: No prevertebral fluid or swelling. No visible canal hematoma. Disc levels:  No significant degenerative changes. Upper chest: Negative. Other: No other abnormalities are identified. IMPRESSION: 1. No acute intracranial abnormality. 2. No fracture or traumatic malalignment in the cervical spine.  Electronically Signed   By: Dorise Bullion III M.D   On: 12/20/2016 16:10   Ct Chest W Contrast  Result Date: 12/20/2016 CLINICAL DATA:  Fall, chest bruising/swelling, left humerus fracture EXAM: CT CHEST WITH CONTRAST TECHNIQUE: Multidetector CT imaging of the chest was performed during intravenous contrast administration. CONTRAST:  75 mL Isovue 300 IV COMPARISON:  Chest radiographs dated 12/02/2016. Left humerus radiographs dated 12/20/2016. CT chest dated 11/15/2015. FINDINGS: Cardiovascular: Mild cardiomegaly.  No pericardial effusion. Coronary atherosclerosis of the LAD. No evidence of thoracic aortic aneurysm. Very mild atherosclerotic calcifications. Mediastinum/Nodes: No suspicious mediastinal lymphadenopathy. Fluid along the mid/ distal esophagus. Visualized left thyroid is notable for a 10 mm nodule. Lungs/Pleura: Evaluation lung parenchyma is mildly constrained by respiratory motion. Mild atelectasis in the bilateral lower lobes. No suspicious pulmonary nodules. No focal consolidation. Small left pleural effusion. No pneumothorax. Upper Abdomen: Visualized upper abdomen is unremarkable. Musculoskeletal: Subcutaneous cystic lesion along the right anterior chest wall measuring 3.1 x 4.3 cm (series 2/image 116), grossly unchanged from 2017, likely reflecting a benign sebaceous cyst. Additional subcutaneous cystic lesion along the left anterior chest wall measuring 2.8 x 5.8 cm (series 2/image 110) and is notable for a mildly thickened rim and at least one septation (series 2/ image 111). This previously measured 1.6 cm in 2017, when the appearance was compatible with a sebaceous cyst. Given the clinical history, superimposed hematoma/seroma is possible. Mild degenerative changes of the thoracic spine. No evidence of sternal fracture. Comminuted left proximal humeral fracture (series 2/ image 33), incompletely visualized. IMPRESSION: Comminuted left proximal humeral fracture, incompletely visualized.  5.8 cm subcutaneous cystic lesion along the left anterior chest wall, possibly reflecting hematoma/ seroma superimposed upon a pre-existing sebaceous cyst. Given interval growth, attention on follow-up is suggested. Small left pleural effusion. Electronically Signed   By: Julian Hy M.D.   On: 12/20/2016 16:10   Ct Cervical Spine Wo Contrast  Result Date: 12/20/2016 CLINICAL DATA:  Pain after fall. EXAM: CT HEAD WITHOUT CONTRAST CT CERVICAL SPINE WITHOUT CONTRAST TECHNIQUE: Multidetector CT imaging of the head and cervical spine was performed following the standard protocol without intravenous contrast. Multiplanar CT image reconstructions of the cervical spine were also generated. COMPARISON:  September 30, 2014 FINDINGS: CT HEAD FINDINGS Brain: No  subdural, epidural, or subarachnoid hemorrhages identified. Ventricles and sulci are prominent but stable. Focal encephalomalacia in the posterior left parietal lobe on series 2, image 23 is unchanged and chronic. Mild white matter changes. No acute cortical ischemia or infarct identified. No mass, mass effect, or midline shift. Vascular: Calcified atherosclerosis is seen in the intracranial carotid arteries. Skull: Normal. Negative for fracture or focal lesion. Sinuses/Orbits: No acute finding. Other: A focus of air in the right anterior soft tissues on series 2, image 317 could be posttraumatic or air within a vessel. No significant soft tissue swelling is seen in the extracranial soft tissues. The orbits are intact. CT CERVICAL SPINE FINDINGS Alignment: Normal. Skull base and vertebrae: No acute fracture. No primary bone lesion or focal pathologic process. Soft tissues and spinal canal: No prevertebral fluid or swelling. No visible canal hematoma. Disc levels:  No significant degenerative changes. Upper chest: Negative. Other: No other abnormalities are identified. IMPRESSION: 1. No acute intracranial abnormality. 2. No fracture or traumatic malalignment in  the cervical spine. Electronically Signed   By: Dorise Bullion III M.D   On: 12/20/2016 16:10   Dg Humerus Left  Result Date: 12/20/2016 CLINICAL DATA:  Pain after trauma. EXAM: LEFT HUMERUS - 2+ VIEW COMPARISON:  None. FINDINGS: There is an angulated comminuted fracture of the proximal humerus without obvious humeral head dislocation. If there is concern for humeral head dislocation, recommend dedicated images of the shoulder. IMPRESSION: Comminuted angulated fracture of the proximal humeral diaphysis/neck. Electronically Signed   By: Dorise Bullion III M.D   On: 12/20/2016 13:08        Scheduled Meds: . carvedilol  25 mg Oral BID WC  . furosemide  40 mg Oral BID  . insulin aspart  0-9 Units Subcutaneous TID WC  . insulin detemir  9 Units Subcutaneous QHS  . losartan  100 mg Oral Daily  . mouth rinse  15 mL Mouth Rinse BID  . ticagrelor  90 mg Oral BID  . Warfarin - Pharmacist Dosing Inpatient   Does not apply q1800   Continuous Infusions: . clindamycin (CLEOCIN) IV Stopped (12/21/16 0530)     LOS: 1 day       Teresia Myint Gerome Apley, MD [facility] Pager [phone]  If 7PM-7AM, please contact night-coverage [email or web address] Password [facility] 12/21/2016, 8:36 AM

## 2016-12-22 LAB — CBC WITH DIFFERENTIAL/PLATELET
Basophils Absolute: 0 10*3/uL (ref 0.0–0.1)
Basophils Relative: 0 %
EOS PCT: 0 %
Eosinophils Absolute: 0 10*3/uL (ref 0.0–0.7)
HEMATOCRIT: 35.8 % — AB (ref 36.0–46.0)
HEMOGLOBIN: 11.9 g/dL — AB (ref 12.0–15.0)
LYMPHS ABS: 1.5 10*3/uL (ref 0.7–4.0)
LYMPHS PCT: 14 %
MCH: 24 pg — AB (ref 26.0–34.0)
MCHC: 33.2 g/dL (ref 30.0–36.0)
MCV: 72.3 fL — AB (ref 78.0–100.0)
Monocytes Absolute: 1 10*3/uL (ref 0.1–1.0)
Monocytes Relative: 9 %
NEUTROS ABS: 8.3 10*3/uL — AB (ref 1.7–7.7)
Neutrophils Relative %: 77 %
PLATELETS: 309 10*3/uL (ref 150–400)
RBC: 4.95 MIL/uL (ref 3.87–5.11)
RDW: 15.7 % — ABNORMAL HIGH (ref 11.5–15.5)
WBC: 10.7 10*3/uL — AB (ref 4.0–10.5)

## 2016-12-22 LAB — HEMOGLOBIN A1C
Hgb A1c MFr Bld: 15.4 % — ABNORMAL HIGH (ref 4.8–5.6)
Mean Plasma Glucose: 395 mg/dL

## 2016-12-22 LAB — PROTIME-INR
INR: 1.21
Prothrombin Time: 15.4 seconds — ABNORMAL HIGH (ref 11.4–15.2)

## 2016-12-22 LAB — BASIC METABOLIC PANEL
ANION GAP: 15 (ref 5–15)
BUN: 22 mg/dL — AB (ref 6–20)
CHLORIDE: 96 mmol/L — AB (ref 101–111)
CO2: 23 mmol/L (ref 22–32)
Calcium: 8.8 mg/dL — ABNORMAL LOW (ref 8.9–10.3)
Creatinine, Ser: 2.26 mg/dL — ABNORMAL HIGH (ref 0.44–1.00)
GFR calc Af Amer: 26 mL/min — ABNORMAL LOW (ref >60)
GFR calc non Af Amer: 22 mL/min — ABNORMAL LOW (ref >60)
GLUCOSE: 164 mg/dL — AB (ref 65–99)
POTASSIUM: 3.2 mmol/L — AB (ref 3.5–5.1)
Sodium: 134 mmol/L — ABNORMAL LOW (ref 135–145)

## 2016-12-22 LAB — AEROBIC CULTURE W GRAM STAIN (SUPERFICIAL SPECIMEN): Culture: NORMAL

## 2016-12-22 LAB — GLUCOSE, CAPILLARY
Glucose-Capillary: 159 mg/dL — ABNORMAL HIGH (ref 65–99)
Glucose-Capillary: 157 mg/dL — ABNORMAL HIGH (ref 65–99)
Glucose-Capillary: 246 mg/dL — ABNORMAL HIGH (ref 65–99)
Glucose-Capillary: 241 mg/dL — ABNORMAL HIGH (ref 65–99)
Glucose-Capillary: 205 mg/dL — ABNORMAL HIGH (ref 65–99)

## 2016-12-22 LAB — AEROBIC CULTURE  (SUPERFICIAL SPECIMEN)

## 2016-12-22 MED ORDER — LIDOCAINE-EPINEPHRINE 1 %-1:100000 IJ SOLN
20.0000 mL | Freq: Once | INTRAMUSCULAR | Status: DC
  Filled 2016-12-22: qty 20

## 2016-12-22 MED ORDER — ENSURE ENLIVE PO LIQD
237.0000 mL | Freq: Two times a day (BID) | ORAL | Status: DC
  Administered 2016-12-22 – 2016-12-24 (×5): 237 mL via ORAL

## 2016-12-22 MED ORDER — WARFARIN SODIUM 5 MG PO TABS
7.5000 mg | ORAL_TABLET | Freq: Once | ORAL | Status: AC
  Administered 2016-12-22: 7.5 mg via ORAL
  Filled 2016-12-22: qty 1

## 2016-12-22 MED ORDER — TRAMADOL HCL 50 MG PO TABS
50.0000 mg | ORAL_TABLET | Freq: Four times a day (QID) | ORAL | Status: DC | PRN
  Administered 2016-12-24: 50 mg via ORAL
  Filled 2016-12-22: qty 1

## 2016-12-22 MED ORDER — SODIUM CHLORIDE 0.9 % IV SOLN
INTRAVENOUS | Status: DC
  Administered 2016-12-22 – 2016-12-23 (×4): via INTRAVENOUS

## 2016-12-22 MED ORDER — ACETAMINOPHEN 500 MG PO TABS
500.0000 mg | ORAL_TABLET | Freq: Four times a day (QID) | ORAL | Status: DC
  Administered 2016-12-22 – 2016-12-25 (×12): 500 mg via ORAL
  Filled 2016-12-22 (×13): qty 1

## 2016-12-22 MED ORDER — ADULT MULTIVITAMIN W/MINERALS CH
1.0000 | ORAL_TABLET | Freq: Every day | ORAL | Status: DC
  Administered 2016-12-22 – 2016-12-25 (×4): 1 via ORAL
  Filled 2016-12-22 (×4): qty 1

## 2016-12-22 MED ORDER — WARFARIN - PHARMACIST DOSING INPATIENT: Freq: Every day | Status: DC

## 2016-12-22 NOTE — Progress Notes (Signed)
Assist PA with exam of chest abcess, areas are tender and small amount purelent drainage noted.

## 2016-12-22 NOTE — Consult Note (Signed)
Reason for Consult: Breast  abscess Referring Physician: Dr. Lurline Del CC:  Arm pain  Amber Young is an 61 y.o. female.  HPI: Patient fell at church, she states that her knee gave out on her and she fell backwards onto her head and left arm. EMS was called and she had an obvious left upper arm deformity. She was given 200 mg of fentanyl and transferred to the hospital. There was also noted her blood pressure was poorly controlled and she had not been taking her medicines as prescribed. In addition to the fall and fractured left arm she reported some swelling below her breast and was concerned that she might have a breast mass. He reports a poor appetite and having lost some weight. Workup in the ED shows she is afebrile vital signs are stable. Labs showed hypokalemia with a glucose of 455 and alkaline phosphatase of 127. Albumin and protein are also low. Troponins are negative 2, lactate 1.42. WBC 8.4, hemoglobin 11.4 hematocrit 33.9. HIV is nonreactive. Plain films of the arm shows an angulated commuted fracture of the proximal humerus without evidence of dislocation there is a question of concern for humeral head dislocation. She was diagnosed with a comminuted angulated fracture of the proximal left humerus. CT scan of the chest shows a comminuted left proximal humeral fracture, 5.8 cm subcutaneous cystic lesion along the left anterior chest also probably reflecting hematoma seroma imposed upon pre-existing sebaceous cyst. She underwent incision and drainage with a scaphel in the ED by Dr.Yao.   Specimen Description ABSCESS   Special Requests NONE   Gram Stain MODERATE WBC PRESENT, PREDOMINANTLY PMN  ABUNDANT GRAM POSITIVE COCCI IN PAIRS IN CHAINS IN CLUSTERS  GRAM NEGATIVE RODS      We are asked to see.  Past Medical History:  Diagnosis Date  . Anemia   . CHF (congestive heart failure) ([place])   . Coronary artery disease   . GERD (gastroesophageal reflux disease)   . Heart murmur   .  Hyperlipidemia   . Hypertension   . Stroke ([place]) 09/2014   numbness left upper lip, finger tips on left hand; "resolved" (05/18/2016)  . Type II diabetes mellitus ([place])     Past Surgical History:  Procedure Laterality Date  . CARDIAC CATHETERIZATION N/A 05/18/2016   Procedure: Left Heart Cath and Coronary Angiography;  Surgeon: Jettie Booze, MD;  Location: [facility];  Service: Cardiovascular;  Laterality: N/A;  . CARDIAC CATHETERIZATION N/A 05/18/2016   Procedure: Coronary Stent Intervention;  Surgeon: Jettie Booze, MD;  Location: [facility];  Service: Cardiovascular;  Laterality: N/A;  . CORONARY ANGIOPLASTY    . TUBAL LIGATION  1980  . VAGINAL HYSTERECTOMY  1999   "partial; fibroids"    Family History  Problem Relation Age of Onset  . Diabetes Mother   . Hypertension Mother   . COPD Mother   . Heart failure Mother   . Hypertension Sister   . Hypertension Brother     Social History:  reports that she has never smoked. She has never used smokeless tobacco. She reports that she does not drink alcohol or use drugs.  Allergies:  Allergies  Allergen Reactions  . Ace Inhibitors Cough    Medications:  Prior to Admission:  Prescriptions Prior to Admission  Medication Sig Dispense Refill Last Dose  . furosemide (LASIX) 40 MG tablet Take 1 tablet (40 mg total) by mouth 2 (two) times daily. 60 tablet 3 Past Month at Unknown  time  . insulin detemir (LEVEMIR) 100 UNIT/ML injection Inject 0.09 mLs (9 Units total) into the skin daily. 10 mL 11 Past Week at Unknown time  . losartan (COZAAR) 100 MG tablet Take 1 tablet (100 mg total) by mouth daily. 30 tablet 12 Past Week at Unknown time  . warfarin (COUMADIN) 5 MG tablet Take 1 tablet (5 mg total) by mouth daily at 6 PM. 30 tablet 3 Past Week at Unknown time  . carvedilol (COREG) 25 MG tablet Take 1 tablet (25 mg total) by mouth 2 (two) times daily. (Patient not taking: Reported on 12/20/2016) 180 tablet 3 Not  Taking at Unknown time  . Glucose Blood (BLOOD GLUCOSE TEST STRIPS) STRP Use as directed 100 each 0 Taking  . Insulin Pen Needle 29G X 10MM MISC Use as directed 100 each 0 Taking  . potassium chloride SA (K-DUR,KLOR-CON) 20 MEQ tablet Take 2 tablets (40 mEq total) by mouth 2 (two) times daily. (Patient not taking: Reported on 12/20/2016) 180 tablet 3 Not Taking at Unknown time  . ticagrelor (BRILINTA) 90 MG TABS tablet Take 1 tablet (90 mg total) by mouth 2 (two) times daily. 60 tablet 12 Taking    Results for orders placed or performed during the hospital encounter of 12/20/16 (from the past 48 hour(s))  CBC with Differential/Platelet     Status: Abnormal   Collection Time: 12/20/16  1:20 PM  Result Value Ref Range   WBC 8.4 4.0 - 10.5 K/uL   RBC 4.61 3.87 - 5.11 MIL/uL   Hemoglobin 11.4 (L) 12.0 - 15.0 g/dL   HCT 33.9 (L) 36.0 - 46.0 %   MCV 73.5 (L) 78.0 - 100.0 fL   MCH 24.7 (L) 26.0 - 34.0 pg   MCHC 33.6 30.0 - 36.0 g/dL   RDW 15.5 11.5 - 15.5 %   Platelets 300 150 - 400 K/uL   Neutrophils Relative % 80 %   Neutro Abs 6.7 1.7 - 7.7 K/uL   Lymphocytes Relative 12 %   Lymphs Abs 1.0 0.7 - 4.0 K/uL   Monocytes Relative 8 %   Monocytes Absolute 0.6 0.1 - 1.0 K/uL   Eosinophils Relative 0 %   Eosinophils Absolute 0.0 0.0 - 0.7 K/uL   Basophils Relative 0 %   Basophils Absolute 0.0 0.0 - 0.1 K/uL  Comprehensive metabolic panel     Status: Abnormal   Collection Time: 12/20/16  1:20 PM  Result Value Ref Range   Sodium 136 135 - 145 mmol/L   Potassium 2.6 (LL) 3.5 - 5.1 mmol/L    Comment: CRITICAL RESULT CALLED TO, READ BACK BY AND VERIFIED WITH: PATTY DOWD,RN [id number] @ 1407 BY J SCOTTON    Chloride 98 (L) 101 - 111 mmol/L   CO2 26 22 - 32 mmol/L   Glucose, Bld 455 (H) 65 - 99 mg/dL   BUN 11 6 - 20 mg/dL   Creatinine, Ser 0.94 0.44 - 1.00 mg/dL   Calcium 8.2 (L) 8.9 - 10.3 mg/dL   Total Protein 6.4 (L) 6.5 - 8.1 g/dL   Albumin 2.5 (L) 3.5 - 5.0 g/dL   AST 20 15 - 41 U/L   ALT  15 14 - 54 U/L   Alkaline Phosphatase 127 (H) 38 - 126 U/L   Total Bilirubin 0.9 0.3 - 1.2 mg/dL   GFR calc non Af Amer >60 >60 mL/min   GFR calc Af Amer >60 >60 mL/min    Comment: (NOTE) The eGFR has been calculated using the  CKD EPI equation. This calculation has not been validated in all clinical situations. eGFR's persistently <60 mL/min signify possible Chronic Kidney Disease.    Anion gap 12 5 - 15  Protime-INR     Status: None   Collection Time: 12/20/16  1:32 PM  Result Value Ref Range   Prothrombin Time 13.8 11.4 - 15.2 seconds   INR 1.06   I-stat troponin, ED     Status: None   Collection Time: 12/20/16  1:33 PM  Result Value Ref Range   Troponin i, poc 0.06 0.00 - 0.08 ng/mL   Comment 3            Comment: Due to the release kinetics of cTnI, a negative result within the first hours of the onset of symptoms does not rule out myocardial infarction with certainty. If myocardial infarction is still suspected, repeat the test at appropriate intervals.   CBG monitoring, ED     Status: Abnormal   Collection Time: 12/20/16  3:54 PM  Result Value Ref Range   Glucose-Capillary 408 (H) 65 - 99 mg/dL  I-stat troponin, ED     Status: None   Collection Time: 12/20/16  4:04 PM  Result Value Ref Range   Troponin i, poc 0.03 0.00 - 0.08 ng/mL   Comment 3            Comment: Due to the release kinetics of cTnI, a negative result within the first hours of the onset of symptoms does not rule out myocardial infarction with certainty. If myocardial infarction is still suspected, repeat the test at appropriate intervals.   I-stat chem 8, ed     Status: Abnormal   Collection Time: 12/20/16  4:05 PM  Result Value Ref Range   Sodium 136 135 - 145 mmol/L   Potassium 2.8 (L) 3.5 - 5.1 mmol/L   Chloride 95 (L) 101 - 111 mmol/L   BUN 10 6 - 20 mg/dL   Creatinine, Ser 0.80 0.44 - 1.00 mg/dL   Glucose, Bld 436 (H) 65 - 99 mg/dL   Calcium, Ion 1.11 (L) 1.15 - 1.40 mmol/L   TCO2 26 0  - 100 mmol/L   Hemoglobin 11.2 (L) 12.0 - 15.0 g/dL   HCT 33.0 (L) 36.0 - 46.0 %  Wound or Superficial Culture     Status: None (Preliminary result)   Collection Time: 12/20/16  4:36 PM  Result Value Ref Range   Specimen Description ABSCESS    Special Requests NONE    Gram Stain      MODERATE WBC PRESENT, PREDOMINANTLY PMN ABUNDANT GRAM POSITIVE COCCI IN PAIRS IN CHAINS IN CLUSTERS GRAM NEGATIVE RODS    Culture      TOO YOUNG TO READ Performed at [facility], [address], [place], [place] [address]    Report Status PENDING   Blood culture (routine x 2)     Status: None (Preliminary result)   Collection Time: 12/20/16  4:43 PM  Result Value Ref Range   Specimen Description BLOOD RIGHT ANTECUBITAL    Special Requests      BOTTLES DRAWN AEROBIC AND ANAEROBIC Blood Culture adequate volume   Culture      NO GROWTH 2 DAYS Performed at [facility], [place] [address] [place], [place] [address]    Report Status PENDING   I-Stat CG4 Lactic Acid, ED     Status: None   Collection Time: 12/20/16  4:48 PM  Result Value Ref Range  Lactic Acid, Venous 1.42 0.5 - 1.9 mmol/L  Blood culture (routine x 2)     Status: None (Preliminary result)   Collection Time: 12/20/16  4:58 PM  Result Value Ref Range   Specimen Description BLOOD RIGHT HAND    Special Requests      BOTTLES DRAWN AEROBIC AND ANAEROBIC Blood Culture adequate volume   Culture      NO GROWTH 2 DAYS Performed at [facility], [place] [address], [place], [place] [address]    Report Status PENDING   HIV antibody (Routine Testing)     Status: None   Collection Time: 12/20/16  6:56 PM  Result Value Ref Range   HIV Screen 4th Generation wRfx Non Reactive Non Reactive    Comment: (NOTE) Performed At: [facility] [place], [place] [id number] Lindon Romp MD EX:[id number]   Magnesium     Status: Abnormal   Collection Time: 12/20/16  6:56 PM  Result Value Ref Range   Magnesium 1.4  (L) 1.7 - 2.4 mg/dL  Phosphorus     Status: None   Collection Time: 12/20/16  6:56 PM  Result Value Ref Range   Phosphorus 3.7 2.5 - 4.6 mg/dL  Glucose, capillary     Status: Abnormal   Collection Time: 12/20/16 10:43 PM  Result Value Ref Range   Glucose-Capillary 353 (H) 65 - 99 mg/dL  Protime-INR     Status: None   Collection Time: 12/21/16  5:57 AM  Result Value Ref Range   Prothrombin Time 14.4 11.4 - 15.2 seconds   INR 1.12   CBC     Status: Abnormal   Collection Time: 12/21/16  5:57 AM  Result Value Ref Range   WBC 8.1 4.0 - 10.5 K/uL   RBC 4.19 3.87 - 5.11 MIL/uL   Hemoglobin 10.2 (L) 12.0 - 15.0 g/dL   HCT 30.9 (L) 36.0 - 46.0 %   MCV 73.7 (L) 78.0 - 100.0 fL   MCH 24.3 (L) 26.0 - 34.0 pg   MCHC 33.0 30.0 - 36.0 g/dL   RDW 15.7 (H) 11.5 - 15.5 %   Platelets 275 150 - 400 K/uL  Basic metabolic panel     Status: Abnormal   Collection Time: 12/21/16  5:57 AM  Result Value Ref Range   Sodium 132 (L) 135 - 145 mmol/L   Potassium 3.8 3.5 - 5.1 mmol/L    Comment: DELTA CHECK NOTED REPEATED TO VERIFY NO VISIBLE HEMOLYSIS    Chloride 95 (L) 101 - 111 mmol/L   CO2 26 22 - 32 mmol/L   Glucose, Bld 363 (H) 65 - 99 mg/dL   BUN 16 6 - 20 mg/dL   Creatinine, Ser 1.51 (H) 0.44 - 1.00 mg/dL   Calcium 8.3 (L) 8.9 - 10.3 mg/dL   GFR calc non Af Amer 36 (L) >60 mL/min   GFR calc Af Amer 42 (L) >60 mL/min    Comment: (NOTE) The eGFR has been calculated using the CKD EPI equation. This calculation has not been validated in all clinical situations. eGFR's persistently <60 mL/min signify possible Chronic Kidney Disease.    Anion gap 11 5 - 15  Magnesium     Status: None   Collection Time: 12/21/16  5:57 AM  Result Value Ref Range   Magnesium 2.1 1.7 - 2.4 mg/dL  Glucose, capillary     Status: Abnormal   Collection Time: 12/21/16  8:12 AM  Result Value Ref Range   Glucose-Capillary 334 (  H) 65 - 99 mg/dL  Glucose, capillary     Status: Abnormal   Collection Time: 12/21/16  12:06 PM  Result Value Ref Range   Glucose-Capillary 309 (H) 65 - 99 mg/dL  Glucose, capillary     Status: Abnormal   Collection Time: 12/21/16  4:20 PM  Result Value Ref Range   Glucose-Capillary 213 (H) 65 - 99 mg/dL  Glucose, capillary     Status: Abnormal   Collection Time: 12/21/16  8:54 PM  Result Value Ref Range   Glucose-Capillary 163 (H) 65 - 99 mg/dL   Comment 1 Notify RN   Glucose, capillary     Status: Abnormal   Collection Time: 12/22/16  4:48 AM  Result Value Ref Range   Glucose-Capillary 159 (H) 65 - 99 mg/dL  Protime-INR     Status: Abnormal   Collection Time: 12/22/16  5:27 AM  Result Value Ref Range   Prothrombin Time 15.4 (H) 11.4 - 15.2 seconds   INR 1.21   CBC with Differential/Platelet     Status: Abnormal   Collection Time: 12/22/16  5:27 AM  Result Value Ref Range   WBC 10.7 (H) 4.0 - 10.5 K/uL   RBC 4.95 3.87 - 5.11 MIL/uL   Hemoglobin 11.9 (L) 12.0 - 15.0 g/dL   HCT 35.8 (L) 36.0 - 46.0 %   MCV 72.3 (L) 78.0 - 100.0 fL   MCH 24.0 (L) 26.0 - 34.0 pg   MCHC 33.2 30.0 - 36.0 g/dL   RDW 15.7 (H) 11.5 - 15.5 %   Platelets 309 150 - 400 K/uL   Neutrophils Relative % 77 %   Neutro Abs 8.3 (H) 1.7 - 7.7 K/uL   Lymphocytes Relative 14 %   Lymphs Abs 1.5 0.7 - 4.0 K/uL   Monocytes Relative 9 %   Monocytes Absolute 1.0 0.1 - 1.0 K/uL   Eosinophils Relative 0 %   Eosinophils Absolute 0.0 0.0 - 0.7 K/uL   Basophils Relative 0 %   Basophils Absolute 0.0 0.0 - 0.1 K/uL  Basic metabolic panel     Status: Abnormal   Collection Time: 12/22/16  5:27 AM  Result Value Ref Range   Sodium 134 (L) 135 - 145 mmol/L   Potassium 3.2 (L) 3.5 - 5.1 mmol/L   Chloride 96 (L) 101 - 111 mmol/L   CO2 23 22 - 32 mmol/L   Glucose, Bld 164 (H) 65 - 99 mg/dL   BUN 22 (H) 6 - 20 mg/dL   Creatinine, Ser 2.26 (H) 0.44 - 1.00 mg/dL   Calcium 8.8 (L) 8.9 - 10.3 mg/dL   GFR calc non Af Amer 22 (L) >60 mL/min   GFR calc Af Amer 26 (L) >60 mL/min    Comment: (NOTE) The eGFR has  been calculated using the CKD EPI equation. This calculation has not been validated in all clinical situations. eGFR's persistently <60 mL/min signify possible Chronic Kidney Disease.    Anion gap 15 5 - 15  Glucose, capillary     Status: Abnormal   Collection Time: 12/22/16  7:58 AM  Result Value Ref Range   Glucose-Capillary 157 (H) 65 - 99 mg/dL  Glucose, capillary     Status: Abnormal   Collection Time: 12/22/16 11:48 AM  Result Value Ref Range   Glucose-Capillary 246 (H) 65 - 99 mg/dL   Comment 1 Notify RN     Ct Head Wo Contrast  Result Date: 12/20/2016 CLINICAL DATA:  Pain after fall. EXAM: CT HEAD WITHOUT  CONTRAST CT CERVICAL SPINE WITHOUT CONTRAST TECHNIQUE: Multidetector CT imaging of the head and cervical spine was performed following the standard protocol without intravenous contrast. Multiplanar CT image reconstructions of the cervical spine were also generated. COMPARISON:  September 30, 2014 FINDINGS: CT HEAD FINDINGS Brain: No subdural, epidural, or subarachnoid hemorrhages identified. Ventricles and sulci are prominent but stable. Focal encephalomalacia in the posterior left parietal lobe on series 2, image 23 is unchanged and chronic. Mild white matter changes. No acute cortical ischemia or infarct identified. No mass, mass effect, or midline shift. Vascular: Calcified atherosclerosis is seen in the intracranial carotid arteries. Skull: Normal. Negative for fracture or focal lesion. Sinuses/Orbits: No acute finding. Other: A focus of air in the right anterior soft tissues on series 2, image 317 could be posttraumatic or air within a vessel. No significant soft tissue swelling is seen in the extracranial soft tissues. The orbits are intact. CT CERVICAL SPINE FINDINGS Alignment: Normal. Skull base and vertebrae: No acute fracture. No primary bone lesion or focal pathologic process. Soft tissues and spinal canal: No prevertebral fluid or swelling. No visible canal hematoma. Disc  levels:  No significant degenerative changes. Upper chest: Negative. Other: No other abnormalities are identified. IMPRESSION: 1. No acute intracranial abnormality. 2. No fracture or traumatic malalignment in the cervical spine. Electronically Signed   By: Dorise Bullion III M.D   On: 12/20/2016 16:10   Ct Chest W Contrast  Result Date: 12/20/2016 CLINICAL DATA:  Fall, chest bruising/swelling, left humerus fracture EXAM: CT CHEST WITH CONTRAST TECHNIQUE: Multidetector CT imaging of the chest was performed during intravenous contrast administration. CONTRAST:  75 mL Isovue 300 IV COMPARISON:  Chest radiographs dated 12/02/2016. Left humerus radiographs dated 12/20/2016. CT chest dated 11/15/2015. FINDINGS: Cardiovascular: Mild cardiomegaly.  No pericardial effusion. Coronary atherosclerosis of the LAD. No evidence of thoracic aortic aneurysm. Very mild atherosclerotic calcifications. Mediastinum/Nodes: No suspicious mediastinal lymphadenopathy. Fluid along the mid/ distal esophagus. Visualized left thyroid is notable for a 10 mm nodule. Lungs/Pleura: Evaluation lung parenchyma is mildly constrained by respiratory motion. Mild atelectasis in the bilateral lower lobes. No suspicious pulmonary nodules. No focal consolidation. Small left pleural effusion. No pneumothorax. Upper Abdomen: Visualized upper abdomen is unremarkable. Musculoskeletal: Subcutaneous cystic lesion along the right anterior chest wall measuring 3.1 x 4.3 cm (series 2/image 116), grossly unchanged from 2017, likely reflecting a benign sebaceous cyst. Additional subcutaneous cystic lesion along the left anterior chest wall measuring 2.8 x 5.8 cm (series 2/image 110) and is notable for a mildly thickened rim and at least one septation (series 2/ image 111). This previously measured 1.6 cm in 2017, when the appearance was compatible with a sebaceous cyst. Given the clinical history, superimposed hematoma/seroma is possible. Mild degenerative  changes of the thoracic spine. No evidence of sternal fracture. Comminuted left proximal humeral fracture (series 2/ image 33), incompletely visualized. IMPRESSION: Comminuted left proximal humeral fracture, incompletely visualized. 5.8 cm subcutaneous cystic lesion along the left anterior chest wall, possibly reflecting hematoma/ seroma superimposed upon a pre-existing sebaceous cyst. Given interval growth, attention on follow-up is suggested. Small left pleural effusion. Electronically Signed   By: Julian Hy M.D.   On: 12/20/2016 16:10   Ct Cervical Spine Wo Contrast  Result Date: 12/20/2016 CLINICAL DATA:  Pain after fall. EXAM: CT HEAD WITHOUT CONTRAST CT CERVICAL SPINE WITHOUT CONTRAST TECHNIQUE: Multidetector CT imaging of the head and cervical spine was performed following the standard protocol without intravenous contrast. Multiplanar CT image reconstructions of the cervical spine  were also generated. COMPARISON:  September 30, 2014 FINDINGS: CT HEAD FINDINGS Brain: No subdural, epidural, or subarachnoid hemorrhages identified. Ventricles and sulci are prominent but stable. Focal encephalomalacia in the posterior left parietal lobe on series 2, image 23 is unchanged and chronic. Mild white matter changes. No acute cortical ischemia or infarct identified. No mass, mass effect, or midline shift. Vascular: Calcified atherosclerosis is seen in the intracranial carotid arteries. Skull: Normal. Negative for fracture or focal lesion. Sinuses/Orbits: No acute finding. Other: A focus of air in the right anterior soft tissues on series 2, image 317 could be posttraumatic or air within a vessel. No significant soft tissue swelling is seen in the extracranial soft tissues. The orbits are intact. CT CERVICAL SPINE FINDINGS Alignment: Normal. Skull base and vertebrae: No acute fracture. No primary bone lesion or focal pathologic process. Soft tissues and spinal canal: No prevertebral fluid or swelling. No  visible canal hematoma. Disc levels:  No significant degenerative changes. Upper chest: Negative. Other: No other abnormalities are identified. IMPRESSION: 1. No acute intracranial abnormality. 2. No fracture or traumatic malalignment in the cervical spine. Electronically Signed   By: Dorise Bullion III M.D   On: 12/20/2016 16:10    Review of Systems  Constitutional: Negative.   HENT: Negative.   Eyes: Negative.   Skin: Negative.        These 2 cyst have been seen in the past and told they were not significant.    All other systems reviewed and are negative.  Blood pressure (!) 163/80, pulse 72, temperature 97.7 F (36.5 C), temperature source Oral, resp. rate 16, height _0  (1.676 m), weight 80.7 kg (177 lb 14.4 oz), SpO2 96 %. Physical Exam  Constitutional: She is oriented to person, place, and time. She appears well-developed. No distress.  Thin rather lethargic woman who is just finished PT.  Appears tired and worn out.  HENT:  Head: Normocephalic and atraumatic.  Mouth/Throat: No oropharyngeal exudate.  Eyes: Right eye exhibits no discharge. Left eye exhibits no discharge. No scleral icterus.  Pupils are equal  Neck: Normal range of motion. Neck supple. No JVD present. No tracheal deviation present. No thyromegaly present.  Cardiovascular: Normal rate, regular rhythm, normal heart sounds and intact distal pulses.   No murmur heard. Respiratory: Effort normal and breath sounds normal. No respiratory distress. She has no wheezes. She has no rales. She exhibits no tenderness.  Both of these are on the chest wall, not the breast  GI: Soft. Bowel sounds are normal. She exhibits no distension and no mass. There is no tenderness. There is no rebound and no guarding.    Musculoskeletal: She exhibits no tenderness. Edema: +2 both legs.  Lymphadenopathy:    She has no cervical adenopathy.  Neurological: She is alert and oriented to person, place, and time. No cranial nerve deficit.    Skin: Skin is warm and dry. No rash noted. She is not diaphoretic. No erythema. No pallor.  Psychiatric: She has a normal mood and affect. Her behavior is normal. Judgment and thought content normal.      Assessment/Plan: Right and left sebaceous cyst S/p partial I&D left chest wall sebaceous cyst - improving cellulitis Fall with comminuted angulated left humeral fracture Hypertension Acute kidney injury Atrial fibrillation - on Coumadin Type ll diabetes - on insulin Non-compliance - pt cannot afford her medicines Hx of CHF/EF 45%  Plan:  I&D of site on the left.  I will then wick both sites with  iodoform, and allow it to improve.  She will need eventual outpatient  excision of both sites when she is stable.    Alexea Blase 12/22/2016, 1:02 PM

## 2016-12-22 NOTE — Progress Notes (Signed)
PROGRESS NOTE    Amber Young  BDZ:[id number] DOB: [birth year] DOA: 12/20/2016 PCP: [facility] [place]    Brief Narrative:  61 yo female presented with the chief complain of fall and left arm pain. Patient known to have HTN, T2DM and atrial fibrillation on anticoagulation. Sustained a mechanical fall while walking, no loss of consciousness. Post trauma, severe left arm pain. On the initial physical examination, blood pressure was 204/112, heart rate 95 with respiratory rate of 18 and oxygen saturation of 98%. Mucous membranes were moist, lungs clear to auscultation, heart with S1-S2 irregular rhythm Soft abdomen with no lower extremity edema. Positive erythematous rash under the breast. Arm films with proximal humeral fracture. Patient admitted with right humeral fracture complicated by breast cellulitis/ abscess.    Assessment & Plan:   Active Problems:   Breast abscess    1. Proximal humeral fracture. Continue immobilization, not candidate for surgical intervention per orthopedic recommendations. Positive oversedation due to narcotics, will change pain regimen to tramadol and scheduled acetaminophen. Follow on physical therapy recommendations for discharge planing.   2. Breast abscess with cellulitis. No signs of local infection no erythema or local tenderness, no leukocytosis or fevers, CT chest describes a cystic lesion 2,8 x 5.8 cm. Antibiotics have been held, will follow on surgical recommendations.   3. Hypokalemia. Serum K at 3,2, will be cautious with supplemental kcl due to worsening gfr.    4. HTN.  Blood pressure control with coreg, losartan. Systolic blood pressure 341 to 170.   5. Atrial fibrillation. Continue rate control coreg, anticoagulation with warfarin daily. INR 1,2  6. T2DM. On insulin sliding scale for glucose cover and monitoring, capillary glucose 159, 157, 246. Continue basal insulin with 9 units of levimir, with good toleration.   7.  Heart failure. Echocardiogram from 04/2016 with EF 45%, patient euvolemic holding furosemide, due to worsening renal function. Continue blood pressure control, with losartan and coreg.    8. AKI. Continue worsening renal function, with a serum cr at  2,26 from admission 0.80, will continue gently hydration with saline at 75 ml per hour, suspected pre-renal due to poor oral intake. Follow on renal panel in am, avoid hypotension or nephrotoxic medications.    DVT prophylaxis: warfarin  Code Status: Full  Family Communication:  Disposition Plan: Home    Consultants:   Orthopedics   Procedures:   Breast abscess I&D (ED)   Antimicrobials:    Subjective: Patient with persistent pain on the left upper extremity, worse with movement, only mild improvement with analgesics.  Noted to be sedated after narcotics. No nausea or vomiting, no dyspnea or chest pain. Poor appetite.   Objective: Vitals:   12/21/16 2044 12/22/16 0426 12/22/16 0433 12/22/16 0540  BP: (!) 113/58 (!) 174/105 (!) 175/90 (!) 163/80  Pulse: (!) 56 72  72  Resp: 16 16    Temp:  97.7 F (36.5 C)    TempSrc:  Oral    SpO2: 100% 96%    Weight:  80.7 kg (177 lb 14.4 oz)    Height:        Intake/Output Summary (Last 24 hours) at 12/22/16 0903 Last data filed at 12/22/16 0600  Gross per 24 hour  Intake              300 ml  Output              350 ml  Net              -  50 ml   Filed Weights   12/20/16 1229 12/21/16 0712 12/22/16 0426  Weight: 74.8 kg (165 lb) 80.8 kg (178 lb 2.1 oz) 80.7 kg (177 lb 14.4 oz)    Examination:  General exam: deconditioned E ENT. Mild pallor, no icterus, oral mucosa dry.  Respiratory system: No wheezing, rales or rhonchi. Respiratory effort normal. Cardiovascular system: S1 & S2 heard, RRR. No JVD, murmurs, rubs, gallops or clicks. No pedal edema. Gastrointestinal system: Abdomen is nondistended, soft and nontender. No organomegaly or masses felt. Normal bowel sounds  heard. Central nervous system: Alert and oriented. No focal neurological deficits. Extremities: Symmetric 5 x 5 power. Left arm on sling.  Skin: indurated mass on the left sternal border, no erythema.     Data Reviewed: I have personally reviewed following labs and imaging studies  CBC:  Recent Labs Lab 12/20/16 1320 12/20/16 1605 12/21/16 0557 12/22/16 0527  WBC 8.4  --  8.1 10.7*  NEUTROABS 6.7  --   --  8.3*  HGB 11.4* 11.2* 10.2* 11.9*  HCT 33.9* 33.0* 30.9* 35.8*  MCV 73.5*  --  73.7* 72.3*  PLT 300  --  275 102   Basic Metabolic Panel:  Recent Labs Lab 12/20/16 1320 12/20/16 1605 12/20/16 1856 12/21/16 0557 12/22/16 0527  NA 136 136  --  132* 134*  K 2.6* 2.8*  --  3.8 3.2*  CL 98* 95*  --  95* 96*  CO2 26  --   --  26 23  GLUCOSE 455* 436*  --  363* 164*  BUN 11 10  --  16 22*  CREATININE 0.94 0.80  --  1.51* 2.26*  CALCIUM 8.2*  --   --  8.3* 8.8*  MG  --   --  1.4* 2.1  --   PHOS  --   --  3.7  --   --    GFR: Estimated Creatinine Clearance: 28.4 mL/min (A) (by C-G formula based on SCr of 2.26 mg/dL (H)). Liver Function Tests:  Recent Labs Lab 12/20/16 1320  AST 20  ALT 15  ALKPHOS 127*  BILITOT 0.9  PROT 6.4*  ALBUMIN 2.5*   No results for input(s): LIPASE, AMYLASE in the last 168 hours. No results for input(s): AMMONIA in the last 168 hours. Coagulation Profile:  Recent Labs Lab 12/20/16 1332 12/21/16 0557 12/22/16 0527  INR 1.06 1.12 1.21   Cardiac Enzymes: No results for input(s): CKTOTAL, CKMB, CKMBINDEX, TROPONINI in the last 168 hours. BNP (last 3 results) No results for input(s): PROBNP in the last 8760 hours. HbA1C: No results for input(s): HGBA1C in the last 72 hours. CBG:  Recent Labs Lab 12/21/16 1206 12/21/16 1620 12/21/16 2054 12/22/16 0448 12/22/16 0758  GLUCAP 309* 213* 163* 159* 157*   Lipid Profile: No results for input(s): CHOL, HDL, LDLCALC, TRIG, CHOLHDL, LDLDIRECT in the last 72 hours. Thyroid  Function Tests: No results for input(s): TSH, T4TOTAL, FREET4, T3FREE, THYROIDAB in the last 72 hours. Anemia Panel: No results for input(s): VITAMINB12, FOLATE, FERRITIN, TIBC, IRON, RETICCTPCT in the last 72 hours. Sepsis Labs:  Recent Labs Lab 12/20/16 1648  LATICACIDVEN 1.42    Recent Results (from the past 240 hour(s))  Wound or Superficial Culture     Status: None (Preliminary result)   Collection Time: 12/20/16  4:36 PM  Result Value Ref Range Status   Specimen Description ABSCESS  Final   Special Requests NONE  Final   Gram Stain   Final  MODERATE WBC PRESENT, PREDOMINANTLY PMN ABUNDANT GRAM POSITIVE COCCI IN PAIRS IN CHAINS IN CLUSTERS GRAM NEGATIVE RODS    Culture   Final    TOO YOUNG TO READ Performed at [facility], [place] [address], [place], [place] [address]    Report Status PENDING  Incomplete  Blood culture (routine x 2)     Status: None (Preliminary result)   Collection Time: 12/20/16  4:43 PM  Result Value Ref Range Status   Specimen Description BLOOD RIGHT ANTECUBITAL  Final   Special Requests   Final    BOTTLES DRAWN AEROBIC AND ANAEROBIC Blood Culture adequate volume   Culture   Final    NO GROWTH < 24 HOURS Performed at [facility], [place] [address] [place], [place] [address]    Report Status PENDING  Incomplete  Blood culture (routine x 2)     Status: None (Preliminary result)   Collection Time: 12/20/16  4:58 PM  Result Value Ref Range Status   Specimen Description BLOOD RIGHT HAND  Final   Special Requests   Final    BOTTLES DRAWN AEROBIC AND ANAEROBIC Blood Culture adequate volume   Culture   Final    NO GROWTH < 24 HOURS Performed at [facility], [place] [address], [place], [place] [address]    Report Status PENDING  Incomplete         Radiology Studies: Ct Head Wo Contrast  Result Date: 12/20/2016 CLINICAL DATA:  Pain after fall. EXAM: CT HEAD WITHOUT CONTRAST CT CERVICAL SPINE WITHOUT CONTRAST TECHNIQUE:  Multidetector CT imaging of the head and cervical spine was performed following the standard protocol without intravenous contrast. Multiplanar CT image reconstructions of the cervical spine were also generated. COMPARISON:  September 30, 2014 FINDINGS: CT HEAD FINDINGS Brain: No subdural, epidural, or subarachnoid hemorrhages identified. Ventricles and sulci are prominent but stable. Focal encephalomalacia in the posterior left parietal lobe on series 2, image 23 is unchanged and chronic. Mild white matter changes. No acute cortical ischemia or infarct identified. No mass, mass effect, or midline shift. Vascular: Calcified atherosclerosis is seen in the intracranial carotid arteries. Skull: Normal. Negative for fracture or focal lesion. Sinuses/Orbits: No acute finding. Other: A focus of air in the right anterior soft tissues on series 2, image 317 could be posttraumatic or air within a vessel. No significant soft tissue swelling is seen in the extracranial soft tissues. The orbits are intact. CT CERVICAL SPINE FINDINGS Alignment: Normal. Skull base and vertebrae: No acute fracture. No primary bone lesion or focal pathologic process. Soft tissues and spinal canal: No prevertebral fluid or swelling. No visible canal hematoma. Disc levels:  No significant degenerative changes. Upper chest: Negative. Other: No other abnormalities are identified. IMPRESSION: 1. No acute intracranial abnormality. 2. No fracture or traumatic malalignment in the cervical spine. Electronically Signed   By: Dorise Bullion III M.D   On: 12/20/2016 16:10   Ct Chest W Contrast  Result Date: 12/20/2016 CLINICAL DATA:  Fall, chest bruising/swelling, left humerus fracture EXAM: CT CHEST WITH CONTRAST TECHNIQUE: Multidetector CT imaging of the chest was performed during intravenous contrast administration. CONTRAST:  75 mL Isovue 300 IV COMPARISON:  Chest radiographs dated 12/02/2016. Left humerus radiographs dated 12/20/2016. CT chest dated  11/15/2015. FINDINGS: Cardiovascular: Mild cardiomegaly.  No pericardial effusion. Coronary atherosclerosis of the LAD. No evidence of thoracic aortic aneurysm. Very mild atherosclerotic calcifications. Mediastinum/Nodes: No suspicious mediastinal lymphadenopathy. Fluid along the mid/ distal esophagus. Visualized left thyroid  is notable for a 10 mm nodule. Lungs/Pleura: Evaluation lung parenchyma is mildly constrained by respiratory motion. Mild atelectasis in the bilateral lower lobes. No suspicious pulmonary nodules. No focal consolidation. Small left pleural effusion. No pneumothorax. Upper Abdomen: Visualized upper abdomen is unremarkable. Musculoskeletal: Subcutaneous cystic lesion along the right anterior chest wall measuring 3.1 x 4.3 cm (series 2/image 116), grossly unchanged from 2017, likely reflecting a benign sebaceous cyst. Additional subcutaneous cystic lesion along the left anterior chest wall measuring 2.8 x 5.8 cm (series 2/image 110) and is notable for a mildly thickened rim and at least one septation (series 2/ image 111). This previously measured 1.6 cm in 2017, when the appearance was compatible with a sebaceous cyst. Given the clinical history, superimposed hematoma/seroma is possible. Mild degenerative changes of the thoracic spine. No evidence of sternal fracture. Comminuted left proximal humeral fracture (series 2/ image 33), incompletely visualized. IMPRESSION: Comminuted left proximal humeral fracture, incompletely visualized. 5.8 cm subcutaneous cystic lesion along the left anterior chest wall, possibly reflecting hematoma/ seroma superimposed upon a pre-existing sebaceous cyst. Given interval growth, attention on follow-up is suggested. Small left pleural effusion. Electronically Signed   By: Julian Hy M.D.   On: 12/20/2016 16:10   Ct Cervical Spine Wo Contrast  Result Date: 12/20/2016 CLINICAL DATA:  Pain after fall. EXAM: CT HEAD WITHOUT CONTRAST CT CERVICAL SPINE WITHOUT  CONTRAST TECHNIQUE: Multidetector CT imaging of the head and cervical spine was performed following the standard protocol without intravenous contrast. Multiplanar CT image reconstructions of the cervical spine were also generated. COMPARISON:  September 30, 2014 FINDINGS: CT HEAD FINDINGS Brain: No subdural, epidural, or subarachnoid hemorrhages identified. Ventricles and sulci are prominent but stable. Focal encephalomalacia in the posterior left parietal lobe on series 2, image 23 is unchanged and chronic. Mild white matter changes. No acute cortical ischemia or infarct identified. No mass, mass effect, or midline shift. Vascular: Calcified atherosclerosis is seen in the intracranial carotid arteries. Skull: Normal. Negative for fracture or focal lesion. Sinuses/Orbits: No acute finding. Other: A focus of air in the right anterior soft tissues on series 2, image 317 could be posttraumatic or air within a vessel. No significant soft tissue swelling is seen in the extracranial soft tissues. The orbits are intact. CT CERVICAL SPINE FINDINGS Alignment: Normal. Skull base and vertebrae: No acute fracture. No primary bone lesion or focal pathologic process. Soft tissues and spinal canal: No prevertebral fluid or swelling. No visible canal hematoma. Disc levels:  No significant degenerative changes. Upper chest: Negative. Other: No other abnormalities are identified. IMPRESSION: 1. No acute intracranial abnormality. 2. No fracture or traumatic malalignment in the cervical spine. Electronically Signed   By: Dorise Bullion III M.D   On: 12/20/2016 16:10   Dg Humerus Left  Result Date: 12/20/2016 CLINICAL DATA:  Pain after trauma. EXAM: LEFT HUMERUS - 2+ VIEW COMPARISON:  None. FINDINGS: There is an angulated comminuted fracture of the proximal humerus without obvious humeral head dislocation. If there is concern for humeral head dislocation, recommend dedicated images of the shoulder. IMPRESSION: Comminuted angulated  fracture of the proximal humeral diaphysis/neck. Electronically Signed   By: Dorise Bullion III M.D   On: 12/20/2016 13:08        Scheduled Meds: . carvedilol  25 mg Oral BID WC  . feeding supplement (ENSURE ENLIVE)  237 mL Oral BID BM  . insulin aspart  0-9 Units Subcutaneous TID WC  . insulin detemir  9 Units Subcutaneous QHS  . losartan  100 mg Oral Daily  . mouth rinse  15 mL Mouth Rinse BID  . ticagrelor  90 mg Oral BID   Continuous Infusions:   LOS: 2 days        Mauricio Gerome Apley, MD [facility] Pager [phone]  If 7PM-7AM, please contact night-coverage [email or web address] Password [email or web address] 12/22/2016, 9:03 AM

## 2016-12-22 NOTE — Progress Notes (Signed)
Initial Nutrition Assessment  DOCUMENTATION CODES:   Not applicable  INTERVENTION:   Magic cup TID with meals, each supplement provides 290 kcal and 9 grams of protein  Ensure Enlive po BID, each supplement provides 350 kcal and 20 grams of protein  MVI  NUTRITION DIAGNOSIS:   Inadequate oral intake related to acute illness, poor appetite as evidenced by meal completion < 25%.  GOAL:   Patient will meet greater than or equal to 90% of their needs  MONITOR:   PO intake, Supplement acceptance, Labs, Weight trends, Skin  REASON FOR ASSESSMENT:   Malnutrition Screening Tool    ASSESSMENT:   61 y.o. female with known HTN, GERD, CAD, DM, a-fib , presented to [facility] ED 5/13 after an episode of fall backwards and fell on her left arm. Proximal humeral diaphysis/neck fracture. Treat with sling.   Met with pt in room today. Pt reports poor appetite and oral intake for one week. Pt currently eating 25% meals and drinking Ensure. Per chart, pt has lost 14lbs(7%) in 7 months; this is not significant. Plan for now is non operative management of fracture per MD note. RD discussed the importance of adequate protein intake for fracture healing. Pt with low potassium monitor and supplement as needed per MD discretion.   Medications reviewed and include: insulin  Labs reviewed: Na 134(L), K 3.2(L), CL 96(L), BUN 22(H), creat 2.26(H), Ca 8.8(L), P 3.7 wnl-5/13, Mg 2.1 wnl- 5/14 Wbc- 10.7(H) cbgs- 455, 426, 363, 164 x 48hrs AIC 11.7(H)  Nutrition-Focused physical exam completed. Findings are no fat depletion, no muscle depletion, and no edema.   Diet Order:  Diet Carb Modified Fluid consistency: Thin; Room service appropriate? Yes  Skin:  Wound (see comment) (incision closed)  Last BM:  5/11  Height:   Ht Readings from Last 1 Encounters:  12/20/16 '5\' 6"'  (1.676 m)    Weight:   Wt Readings from Last 1 Encounters:  12/22/16 177 lb 14.4 oz (80.7 kg)    Ideal Body Weight:  59  kg  BMI:  Body mass index is 28.71 kg/m.  Estimated Nutritional Needs:   Kcal:  1600-1900kcal/day   Protein:  80-96g/day  Fluid:  >1.6L/day   EDUCATION NEEDS:   No education needs identified at this time  Koleen Distance MS, RD, LDN Pager #- [phone]

## 2016-12-22 NOTE — Progress Notes (Signed)
Occupational Therapy Treatment Patient Details Name: Amber Young MRN: [id number] DOB: [birth year] Today's Date: 12/22/2016    History of present illness Amber Young is a 61 y.o. female with known HTN, GERD, CAD, DM, a-fin on coumadin, presented to [facility] ED after an episode of fall backwards and fell on her left arm. Proximal humeral diaphysis/neck fracture.   OT comments  OT session focused on L shoulder positioning, elbow/wrist and finger ROM and sling management  Pt stated she felt bones were moving during elbow flexion   Follow Up Recommendations  SNF    Equipment Recommendations  None recommended by OT    Recommendations for Other Services      Precautions / Restrictions Precautions Precautions: Shoulder Shoulder Interventions: Shoulder sling/immobilizer Precaution Comments: NWB to LUE, sling for comfort, ROM OK elbow wrist and hand per MD       Mobility Bed Mobility              NT    Transfers      NT                Balance                                           ADL either performed or assessed with clinical judgement   ADL Overall ADL's : Needs assistance/impaired                                       General ADL Comments: pt more awake this Young.  OT educated pt and daughter on shoulder precautions as well as positioning and elbow, wrist and hand ROM     Vision Patient Visual Report: No change from baseline     Perception     Praxis      Cognition Arousal/Alertness: Awake/alert Behavior During Therapy: WFL for tasks assessed/performed Overall Cognitive Status: Within Functional Limits for tasks assessed                                          Exercises     Shoulder Instructions Shoulder Instructions Donning/doffing sling/immobilizer: Maximal assistance Correct positioning of sling/immobilizer: Maximal assistance ROM for elbow, wrist and digits of operated UE: Maximal  assistance Positioning of UE while sleeping: Maximal assistance     General Comments      Pertinent Vitals/ Pain       Pain Score: 5  Pain Location: L shoulder Pain Descriptors / Indicators: Sore;Grimacing Pain Intervention(s): Monitored during session;Repositioned;Relaxation;Limited activity within patient's tolerance     Prior Functioning/Environment              Frequency  Min 2X/week        Progress Toward Goals  OT Goals(current goals can now be found in the care plan section)  Progress towards OT goals: Progressing toward goals     Plan      Co-evaluation                 AM-PAC PT "6 Clicks" Daily Activity     Outcome Measure   Help from another person eating meals?: A Lot Help from another person taking care of personal grooming?: A Lot Help from another person  toileting, which includes using toliet, bedpan, or urinal?: Total Help from another person bathing (including washing, rinsing, drying)?: Total Help from another person to put on and taking off regular upper body clothing?: Total Help from another person to put on and taking off regular lower body clothing?: Total 6 Click Score: 8    End of Session    OT Visit Diagnosis: Muscle weakness (generalized) (M62.81);History of falling (Z91.81)   Activity Tolerance Patient tolerated treatment well   Patient Left in bed;with call bell/phone within reach;with bed alarm set   Nurse Communication Mobility status        Time: 0923-3007 OT Time Calculation (min): 19 min  Charges: OT General Charges $OT Visit: 1 Procedure OT Treatments $Therapeutic Activity: 8-22 mins  [place], [place]   Betsy Pries 12/22/2016, 12:43 PM

## 2016-12-22 NOTE — Clinical Social Work Note (Signed)
LCSW met with patient, her husband and daughter at bedside.  Patient and family do not want SNF and will be taking patient home.  Possible discharge tomorrow.  Dede Query, LCSW [place] Worker  cell #:

## 2016-12-22 NOTE — Progress Notes (Signed)
Physical Therapy Treatment Patient Details Name: Amber Young MRN: [id number] DOB: 11-01-1955 Today's Date: 12/22/2016    History of Present Illness Amber Young is a 61 y.o. female with known HTN, GERD, CAD, DM, a-fin on coumadin, presented to [facility] ED after an episode of fall backwards and fell on her left arm. Proximal humeral diaphysis/neck fracture.    PT Comments    Pt in bed resting HR 58 sleepy/groggy required increased time to arouse.  Spouse Milbert Coulter also present.  Assisted transferring pt to EOB + 2 assist to increase stimulation/alertness.  Pt sat EOB Indep 4 min before attempting sit to stand + 2 assist. Assisted to bathroom to void.  Alertness increased.  Assisted with hygiene as pt was unable.  Amb a great distance in hallway + 2 hand held assist (R UE hand held assist/A around waist on L side).  Unsteady drunken gait.  Leon assisted by following with recliner.  Tolerated 2 walks total 225 feet.  Returned to room and assisted back to bed and positioned to comfort.  Follow Up Recommendations  SNF;Home health PT  pending progress   Equipment Recommendations       Recommendations for Other Services       Precautions / Restrictions Precautions Precautions: Shoulder Shoulder Interventions: Shoulder sling/immobilizer Precaution Comments: NWB to LUE, sling for comfort, ROM OK elbow wrist and hand per MD Required Braces or Orthoses: Sling Restrictions Weight Bearing Restrictions: Yes LUE Weight Bearing: Non weight bearing    Mobility  Bed Mobility Overal bed mobility: Needs Assistance Bed Mobility: Supine to Sit     Supine to sit: +2 for physical assistance Sit to supine: +2 for safety/equipment   General bed mobility comments: + 2 assist for safety and level of groggy.  Increased alertness with activity.  Able to sit Indep EOB x 4 min with no c/o  Transfers   Equipment used: None;2 person hand held assist             General transfer comment: + 2 assist  for safety with 50% VC's on turn completion and R hand placement to increase self assist with rise and decend   Also assist to high rise toilet.    Ambulation/Gait Ambulation/Gait assistance: +2 physical assistance;Mod assist Ambulation Distance (Feet): 225 Feet (125 feet, 100 feet one sitting rest break) Assistive device: None Gait Pattern/deviations: Step-to pattern;Step-through pattern;Drifts right/left Gait velocity: decreased   General Gait Details: sleepy/groggy gait required + 2 hand held assist on R UE and around waist on L (L UE sling and NWB).  Tolerated a good distance.  Unsteady gait.  Spouse assisted by following with recliner.     Stairs            Wheelchair Mobility    Modified Rankin (Stroke Patients Only)       Balance                                            Cognition Arousal/Alertness:  (50/50 sleepy/groggy/awake enough to participate) Behavior During Therapy: WFL for tasks assessed/performed Overall Cognitive Status: Within Functional Limits for tasks assessed                                 General Comments: required increased time and VC's to arouse enough to participate  Exercises Donning/doffing sling/immobilizer: Maximal assistance Correct positioning of sling/immobilizer: Maximal assistance ROM for elbow, wrist and digits of operated UE: Maximal assistance Positioning of UE while sleeping: Maximal assistance    General Comments        Pertinent Vitals/Pain Pain Assessment: Faces Pain Score: 5  Faces Pain Scale: Hurts little more Pain Location: L distal Humerus Pain Descriptors / Indicators: Sore;Grimacing Pain Intervention(s): Monitored during session    Home Living                      Prior Function            PT Goals (current goals can now be found in the care plan section) Progress towards PT goals: Progressing toward goals    Frequency    Min 3X/week      PT Plan  Current plan remains appropriate    Co-evaluation              AM-PAC PT "6 Clicks" Daily Activity  Outcome Measure                   End of Session Equipment Utilized During Treatment: Gait belt Activity Tolerance: Other (comment) (sleepy/groggy) Patient left: in bed;with call bell/phone within reach;with family/visitor present;with nursing/sitter in room Nurse Communication: Mobility status PT Visit Diagnosis: Difficulty in walking, not elsewhere classified (R26.2)     Time: 1655-3748 PT Time Calculation (min) (ACUTE ONLY): 25 min  Charges:  $Gait Training: 8-22 mins $Therapeutic Activity: 8-22 mins                    G Codes:       Rica Koyanagi  PTA [facility]  Acute  Rehab Pager      [phone]

## 2016-12-22 NOTE — Care Management Note (Signed)
Case Management Note  Patient Details  Name: Katiya Fike MRN: [id number] Date of Birth: February 15, 1956  Subjective/Objective: Pt admitted with Breast abscess and Proximal humeral fracture.                    Action/Plan: Appointment at [place] on 5/22 at 10 AM   Expected Discharge Date:                  Expected Discharge Plan:  [place]  In-House Referral:  Clinical Social Work  Discharge planning Services  CM Consult, Follow-up appt scheduled  Post Acute Care Choice:    Choice offered to:  Spouse, Patient  DME Arranged:    DME Agency:     HH Arranged:    [place] Agency:  [place]  Status of Service:  Completed, signed off  If discussed at [place] of Stay Meetings, dates discussed:    Additional CommentsPurcell Mouton, RN 12/22/2016, 3:12 PM

## 2016-12-22 NOTE — Progress Notes (Signed)
ANTICOAGULATION CONSULT NOTE - Follow Up Consult  Pharmacy Consult for Warfarin Indication: atrial fibrillation  Allergies  Allergen Reactions  . Ace Inhibitors Cough    Patient Measurements: Height: 5\' 6"  (167.6 cm) Weight: 177 lb 14.4 oz (80.7 kg) IBW/kg (Calculated) : 59.3  Vital Signs: Temp: 97.7 F (36.5 C) (05/15 0426) Temp Source: Oral (05/15 0426) BP: 163/80 (05/15 0540) Pulse Rate: 72 (05/15 0540)  Labs:  Recent Labs  12/20/16 1320 12/20/16 1332 12/20/16 1605 12/21/16 0557 12/22/16 0527  HGB 11.4*  --  11.2* 10.2* 11.9*  HCT 33.9*  --  33.0* 30.9* 35.8*  PLT 300  --   --  275 309  LABPROT  --  13.8  --  14.4 15.4*  INR  --  1.06  --  1.12 1.21  CREATININE 0.94  --  0.80 1.51* 2.26*    Estimated Creatinine Clearance: 28.4 mL/min (A) (by C-G formula based on SCr of 2.26 mg/dL (H)).   Medications:  Scheduled:  . acetaminophen  500 mg Oral Q6H  . carvedilol  25 mg Oral BID WC  . feeding supplement (ENSURE ENLIVE)  237 mL Oral BID BM  . insulin aspart  0-9 Units Subcutaneous TID WC  . insulin detemir  9 Units Subcutaneous QHS  . lidocaine-EPINEPHrine  20 mL Infiltration Once  . losartan  100 mg Oral Daily  . mouth rinse  15 mL Mouth Rinse BID  . multivitamin with minerals  1 tablet Oral Daily  . ticagrelor  90 mg Oral BID   Infusions:  . sodium chloride 75 mL/hr at 12/22/16 1228    Assessment: 57 yoF admitted on 5/13 s/p fall with humerus fracture (no surgical intervention planned), breast abscess with cellulitis (s/p I&D in ED) and PMH significant for CHF, HTN, DM, and Afib on chronic warfarin anticoagulation.  CT head was without acute intracranial abnormality.  Pharmacy was initially consulted to continue warfarin dosing inpatient, the consult was d/c on 5/14 (no doses given 5/14) and resumed on 5/15.  Home warfarin dose:  5mg  daily with LD unknown but reports sometime in the last 7 days Patient noted to be noncompliant with  medications. Admission INR 1.06  Today, 12/22/2016: INR 1.21 CBC:  Hgb 11.9 is low/stable, Plt WNL SCr increased (0.8 > 2.26) No major drug-drug interactions noted Diet: carb modified   Goal of Therapy:  INR 2-3 Monitor platelets by anticoagulation protocol: Yes   Plan:   Warfarin 7.5 mg PO x 1 at 1800  Daily PT/INR.  Monitor for signs and symptoms of bleeding.  Gretta Arab PharmD, BCPS Pager [phone] 12/22/2016 3:22 PM

## 2016-12-23 ENCOUNTER — Inpatient Hospital Stay (HOSPITAL_COMMUNITY): Payer: Self-pay

## 2016-12-23 DIAGNOSIS — Z794 Long term (current) use of insulin: Secondary | ICD-10-CM

## 2016-12-23 DIAGNOSIS — E1121 Type 2 diabetes mellitus with diabetic nephropathy: Secondary | ICD-10-CM

## 2016-12-23 DIAGNOSIS — S42202A Unspecified fracture of upper end of left humerus, initial encounter for closed fracture: Secondary | ICD-10-CM

## 2016-12-23 DIAGNOSIS — N611 Abscess of the breast and nipple: Principal | ICD-10-CM

## 2016-12-23 DIAGNOSIS — N179 Acute kidney failure, unspecified: Secondary | ICD-10-CM

## 2016-12-23 LAB — BASIC METABOLIC PANEL
ANION GAP: 14 (ref 5–15)
BUN: 23 mg/dL — ABNORMAL HIGH (ref 6–20)
CALCIUM: 8.1 mg/dL — AB (ref 8.9–10.3)
CO2: 21 mmol/L — AB (ref 22–32)
CREATININE: 2.21 mg/dL — AB (ref 0.44–1.00)
Chloride: 99 mmol/L — ABNORMAL LOW (ref 101–111)
GFR calc non Af Amer: 23 mL/min — ABNORMAL LOW (ref >60)
GFR, EST AFRICAN AMERICAN: 27 mL/min — AB (ref >60)
GLUCOSE: 227 mg/dL — AB (ref 65–99)
Potassium: 3.1 mmol/L — ABNORMAL LOW (ref 3.5–5.1)
Sodium: 134 mmol/L — ABNORMAL LOW (ref 135–145)

## 2016-12-23 LAB — GLUCOSE, CAPILLARY
Glucose-Capillary: 191 mg/dL — ABNORMAL HIGH (ref 65–99)
Glucose-Capillary: 334 mg/dL — ABNORMAL HIGH (ref 65–99)
Glucose-Capillary: 433 mg/dL — ABNORMAL HIGH (ref 65–99)
Glucose-Capillary: 528 mg/dL (ref 65–99)
Glucose-Capillary: 416 mg/dL — ABNORMAL HIGH (ref 65–99)

## 2016-12-23 LAB — PROTIME-INR
INR: 1.47
PROTHROMBIN TIME: 18 s — AB (ref 11.4–15.2)

## 2016-12-23 LAB — SODIUM, URINE, RANDOM: Sodium, Ur: 19 mmol/L

## 2016-12-23 LAB — CREATININE, URINE, RANDOM: Creatinine, Urine: 134.46 mg/dL

## 2016-12-23 MED ORDER — INSULIN ASPART 100 UNIT/ML ~~LOC~~ SOLN
14.0000 [IU] | Freq: Once | SUBCUTANEOUS | Status: AC
  Administered 2016-12-23: 14 [IU] via SUBCUTANEOUS

## 2016-12-23 MED ORDER — POTASSIUM CHLORIDE 10 MEQ/100ML IV SOLN
10.0000 meq | INTRAVENOUS | Status: AC
  Administered 2016-12-23 (×3): 10 meq via INTRAVENOUS
  Filled 2016-12-23 (×4): qty 100

## 2016-12-23 MED ORDER — INSULIN DETEMIR 100 UNIT/ML ~~LOC~~ SOLN
8.0000 [IU] | Freq: Two times a day (BID) | SUBCUTANEOUS | Status: DC
  Administered 2016-12-23: 8 [IU] via SUBCUTANEOUS
  Filled 2016-12-23: qty 0.08

## 2016-12-23 MED ORDER — INSULIN DETEMIR 100 UNIT/ML ~~LOC~~ SOLN
5.0000 [IU] | Freq: Once | SUBCUTANEOUS | Status: AC
  Administered 2016-12-23: 5 [IU] via SUBCUTANEOUS
  Filled 2016-12-23: qty 0.05

## 2016-12-23 MED ORDER — INSULIN DETEMIR 100 UNIT/ML ~~LOC~~ SOLN
10.0000 [IU] | Freq: Two times a day (BID) | SUBCUTANEOUS | Status: DC
  Administered 2016-12-24: 10 [IU] via SUBCUTANEOUS
  Filled 2016-12-23 (×2): qty 0.1

## 2016-12-23 MED ORDER — WARFARIN SODIUM 5 MG PO TABS
7.5000 mg | ORAL_TABLET | Freq: Once | ORAL | Status: AC
  Administered 2016-12-23: 7.5 mg via ORAL
  Filled 2016-12-23: qty 1

## 2016-12-23 NOTE — Progress Notes (Signed)
PROGRESS NOTE [facility]   Amber Young   QQP:[id number] DOB: 24-Jul-1956  DOA: 12/20/2016 PCP: [facility] [place]   Brief Narrative:  61 year old female with history of hypertension and type 2 diabetes mellitus and A. fib on anticoagulation presented to the emergency department after sustaining a mechanical fall and left arm pain. Patient was found to have a right humeral fracture and was admitted for pain management and also found to have breast cellulitis/abscess. Orthopedic team was consulted and recommended conservative treatment. Surgical team was consulted for cellulitis/abscess and recommended I&D of the lesions.  Subjective: Patient seen and examined doing much better, no pain while resting mild pain with range of motion. Patient afebrile has no other complaints.  Assessment & Plan: Proximal humeral fracture Continue immobilization, candidate for surgical intervention per orthopedics recommendation. Patient was oversedated with narcotics, pain regimen was changed to tramadol and scheduled Tylenol PT recommending sniff versus home health  Breast abscess with cellulitis  CT chest distress cystic lesion, antibiotics will, surgical recommending I&D and cyst excision as an outpatient.  Hypokalemia Serum K 3.1 Replete Check in am  Hypertension -blood pressure well controlled Continue current regimen  A. Fib - rate control on anticoagulation with Coumadin Monitor INR  Continue Tele  Diabetes mellitus type 2 - not well-controlled A1c 15.4 on 12/20/2016 Having poorly controlled glucose will interfere with wound healing. Patient advised to have better control of glucose. Increase insulin Levemir to 8 units twice a day Continue sliding scale Monitor SSI  AKI - suspect prerenal due to poor oral intake We'll get urine lytes Continue IV fluids - careful with hydration as patient have history of heart failure We'll get renal ultrasound Hold  losartan Monitor renal panel in a.m.   DVT prophylaxis: Warfarin Code Status: Full Family Communication: None at bedside Disposition Plan: SNF versus home health PT in the next 24/48 hrs  Consultants:   Gen. surgery  Procedures:   None  Antimicrobials: Antibiotics Given (last 72 hours)    Date/Time Action Medication Dose Rate   12/20/16 2300 New Bag/Given   clindamycin (CLEOCIN) IVPB 600 mg 600 mg 100 mL/hr   12/21/16 0500 New Bag/Given   clindamycin (CLEOCIN) IVPB 600 mg 600 mg 100 mL/hr   12/21/16 1004 New Bag/Given   clindamycin (CLEOCIN) IVPB 600 mg 600 mg 100 mL/hr       Objective: Vitals:   12/22/16 0540 12/22/16 1531 12/22/16 2046 12/23/16 0417  BP: (!) 163/80 131/68 (!) 145/64 (!) 147/60  Pulse: 72 61 60 69  Resp:  20 18 18   Temp:  98.4 F (36.9 C) 98.4 F (36.9 C) 99.3 F (37.4 C)  TempSrc:  Oral Oral Oral  SpO2:  99% 100% 100%  Weight:    80.2 kg (176 lb 12.9 oz)  Height:        Intake/Output Summary (Last 24 hours) at 12/23/16 1118 Last data filed at 12/23/16 0600  Gross per 24 hour  Intake             1585 ml  Output              825 ml  Net              760 ml   Filed Weights   12/21/16 0712 12/22/16 0426 12/23/16 0417  Weight: 80.8 kg (178 lb 2.1 oz) 80.7 kg (177 lb 14.4 oz) 80.2 kg (176 lb 12.9 oz)    Examination:  General exam: Appears calm and comfortable  HEENT: AC/AT,  PERRLA, OP moist and clear Respiratory system: Clear to auscultation. No wheezes,crackle or rhonchi Cardiovascular system: S1 & S2 heard, RRR. No JVD, murmurs, rubs or gallops Gastrointestinal system: Abdomen is nondistended, soft and nontender. No organomegaly or masses felt. Normal bowel sounds heard. Central nervous system: Alert and oriented. No focal neurological deficits. Extremities: Left arm on sling, good range of movement of fingers Skin:    Psychiatry: Judgement and insight appear normal. Mood & affect appropriate.    Data Reviewed: I have personally  reviewed following labs and imaging studies  CBC:  Recent Labs Lab 12/20/16 1320 12/20/16 1605 12/21/16 0557 12/22/16 0527  WBC 8.4  --  8.1 10.7*  NEUTROABS 6.7  --   --  8.3*  HGB 11.4* 11.2* 10.2* 11.9*  HCT 33.9* 33.0* 30.9* 35.8*  MCV 73.5*  --  73.7* 72.3*  PLT 300  --  275 562   Basic Metabolic Panel:  Recent Labs Lab 12/20/16 1320 12/20/16 1605 12/20/16 1856 12/21/16 0557 12/22/16 0527 12/23/16 0527  NA 136 136  --  132* 134* 134*  K 2.6* 2.8*  --  3.8 3.2* 3.1*  CL 98* 95*  --  95* 96* 99*  CO2 26  --   --  26 23 21*  GLUCOSE 455* 436*  --  363* 164* 227*  BUN 11 10  --  16 22* 23*  CREATININE 0.94 0.80  --  1.51* 2.26* 2.21*  CALCIUM 8.2*  --   --  8.3* 8.8* 8.1*  MG  --   --  1.4* 2.1  --   --   PHOS  --   --  3.7  --   --   --    GFR: Estimated Creatinine Clearance: 28.9 mL/min (A) (by C-G formula based on SCr of 2.21 mg/dL (H)). Liver Function Tests:  Recent Labs Lab 12/20/16 1320  AST 20  ALT 15  ALKPHOS 127*  BILITOT 0.9  PROT 6.4*  ALBUMIN 2.5*   No results for input(s): LIPASE, AMYLASE in the last 168 hours. No results for input(s): AMMONIA in the last 168 hours. Coagulation Profile:  Recent Labs Lab 12/20/16 1332 12/21/16 0557 12/22/16 0527 12/23/16 0527  INR 1.06 1.12 1.21 1.47   Cardiac Enzymes: No results for input(s): CKTOTAL, CKMB, CKMBINDEX, TROPONINI in the last 168 hours. BNP (last 3 results) No results for input(s): PROBNP in the last 8760 hours. HbA1C:  Recent Labs  12/20/16 1856  HGBA1C 15.4*   CBG:  Recent Labs Lab 12/22/16 0758 12/22/16 1148 12/22/16 1638 12/22/16 2125 12/23/16 0756  GLUCAP 157* 246* 241* 205* 191*   Lipid Profile: No results for input(s): CHOL, HDL, LDLCALC, TRIG, CHOLHDL, LDLDIRECT in the last 72 hours. Thyroid Function Tests: No results for input(s): TSH, T4TOTAL, FREET4, T3FREE, THYROIDAB in the last 72 hours. Anemia Panel: No results for input(s): VITAMINB12, FOLATE,  FERRITIN, TIBC, IRON, RETICCTPCT in the last 72 hours. Sepsis Labs:  Recent Labs Lab 12/20/16 1648  LATICACIDVEN 1.42    Recent Results (from the past 240 hour(s))  Wound or Superficial Culture     Status: None   Collection Time: 12/20/16  4:36 PM  Result Value Ref Range Status   Specimen Description ABSCESS  Final   Special Requests NONE  Final   Gram Stain   Final    MODERATE WBC PRESENT, PREDOMINANTLY PMN ABUNDANT GRAM POSITIVE COCCI IN PAIRS IN CHAINS IN CLUSTERS GRAM NEGATIVE RODS    Culture   Final    NORMAL  SKIN FLORA Performed at [facility], [place] [address], [place], [place] [address]    Report Status 12/22/2016 FINAL  Final  Blood culture (routine x 2)     Status: None (Preliminary result)   Collection Time: 12/20/16  4:43 PM  Result Value Ref Range Status   Specimen Description BLOOD RIGHT ANTECUBITAL  Final   Special Requests   Final    BOTTLES DRAWN AEROBIC AND ANAEROBIC Blood Culture adequate volume   Culture   Final    NO GROWTH 2 DAYS Performed at [facility], [place] [address], [place], [place] [address]    Report Status PENDING  Incomplete  Blood culture (routine x 2)     Status: None (Preliminary result)   Collection Time: 12/20/16  4:58 PM  Result Value Ref Range Status   Specimen Description BLOOD RIGHT HAND  Final   Special Requests   Final    BOTTLES DRAWN AEROBIC AND ANAEROBIC Blood Culture adequate volume   Culture   Final    NO GROWTH 2 DAYS Performed at [facility], [place] [address], [place], [place] [address]    Report Status PENDING  Incomplete         Radiology Studies: No results found.    Scheduled Meds: . acetaminophen  500 mg Oral Q6H  . carvedilol  25 mg Oral BID WC  . feeding supplement (ENSURE ENLIVE)  237 mL Oral BID BM  . insulin aspart  0-9 Units Subcutaneous TID WC  . insulin detemir  9 Units Subcutaneous QHS  . lidocaine-EPINEPHrine  20 mL Infiltration Once  . losartan  100 mg Oral Daily  .  mouth rinse  15 mL Mouth Rinse BID  . multivitamin with minerals  1 tablet Oral Daily  . ticagrelor  90 mg Oral BID  . warfarin  7.5 mg Oral Once  . Warfarin - Pharmacist Dosing Inpatient   Does not apply q1800   Continuous Infusions: . sodium chloride 75 mL/hr at 12/23/16 0039  . potassium chloride       LOS: 3 days    Chipper Oman, MD Pager: Text Page via [email or web address]  [phone]  If 7PM-7AM, please contact night-coverage [email or web address] Password [email or web address] 12/23/2016, 11:18 AM

## 2016-12-23 NOTE — Progress Notes (Signed)
ANTICOAGULATION CONSULT NOTE - Follow Up Consult  Pharmacy Consult for Warfarin Indication: atrial fibrillation  Allergies  Allergen Reactions  . Ace Inhibitors Cough    Patient Measurements: Height: 5\' 6"  (167.6 cm) Weight: 176 lb 12.9 oz (80.2 kg) IBW/kg (Calculated) : 59.3  Vital Signs: Temp: 99.3 F (37.4 C) (05/16 0417) Temp Source: Oral (05/16 0417) BP: 147/60 (05/16 0417) Pulse Rate: 69 (05/16 0417)  Labs:  Recent Labs  12/20/16 1320  12/20/16 1605 12/21/16 0557 12/22/16 0527 12/23/16 0527  HGB 11.4*  --  11.2* 10.2* 11.9*  --   HCT 33.9*  --  33.0* 30.9* 35.8*  --   PLT 300  --   --  275 309  --   LABPROT  --   < >  --  14.4 15.4* 18.0*  INR  --   < >  --  1.12 1.21 1.47  CREATININE 0.94  --  0.80 1.51* 2.26* 2.21*  < > = values in this interval not displayed.  Estimated Creatinine Clearance: 28.9 mL/min (A) (by C-G formula based on SCr of 2.21 mg/dL (H)).   Medications:  Scheduled:  . acetaminophen  500 mg Oral Q6H  . carvedilol  25 mg Oral BID WC  . feeding supplement (ENSURE ENLIVE)  237 mL Oral BID BM  . insulin aspart  0-9 Units Subcutaneous TID WC  . insulin detemir  9 Units Subcutaneous QHS  . lidocaine-EPINEPHrine  20 mL Infiltration Once  . losartan  100 mg Oral Daily  . mouth rinse  15 mL Mouth Rinse BID  . multivitamin with minerals  1 tablet Oral Daily  . ticagrelor  90 mg Oral BID  . warfarin  7.5 mg Oral Once  . Warfarin - Pharmacist Dosing Inpatient   Does not apply q1800   Infusions:  . sodium chloride 75 mL/hr at 12/23/16 0039  . potassium chloride      Assessment: 1 yoF admitted on 5/13 s/p fall with humerus fracture (no surgical intervention planned), breast abscess with cellulitis (s/p I&D in ED) and PMH significant for CHF, HTN, DM, and Afib on chronic warfarin anticoagulation.  CT head was without acute intracranial abnormality.  Pharmacy was initially consulted to continue warfarin dosing inpatient, the consult was d/c on  5/14 (no doses given 5/14) and resumed on 5/15.  Home warfarin dose:  5mg  daily with LD unknown but reports sometime in the last 7 days Patient noted to be noncompliant with medications. Admission INR 1.06  Today, 12/23/2016: INR 1.47, remains subtherapeutic, but trending upward CBC (5/15):  Hgb 11.9 is low/stable, Plt WNL SCr elevated, but slightly improved  No major drug-drug interactions noted Diet: carb modified   Goal of Therapy:  INR 2-3 Monitor platelets by anticoagulation protocol: Yes   Plan:   Repeat Warfarin 7.5 mg PO x 1 today, give dose early at 1200  Daily PT/INR.  Monitor for signs and symptoms of bleeding.   Lindell Spar, PharmD, BCPS Pager: [phone] 12/23/2016 8:11 AM

## 2016-12-23 NOTE — Progress Notes (Signed)
Occupational Therapy Treatment Patient Details Name: Amber Young MRN: [id number] DOB: 11/26/1955 Today's Date: 12/23/2016    History of present illness Amber Young is a 61 y.o. female with known HTN, GERD, CAD, DM, a-fin on coumadin, presented to [facility] ED after an episode of fall backwards and fell on her left arm. Proximal humeral diaphysis/neck fracture.   OT comments  Pt much improved this day. Pt states she is going to a rehab but chart states she is going home  Follow Up Recommendations  SNF;Home health OT;Supervision/Assistance - 24 hour    Equipment Recommendations  3 in 1 bedside commode    Recommendations for Other Services      Precautions / Restrictions Precautions Precautions: Shoulder Shoulder Interventions: Shoulder sling/immobilizer Precaution Comments: NWB to LUE, sling for comfort, ROM OK elbow wrist and hand per MD Restrictions Weight Bearing Restrictions: Yes LUE Weight Bearing: Non weight bearing       Mobility Bed Mobility Overal bed mobility: Needs Assistance Bed Mobility: Supine to Sit     Supine to sit: Mod assist        Transfers Overall transfer level: Needs assistance Equipment used: 1 person hand held assist Transfers: Sit to/from [employer] Transfers Sit to Stand: Mod assist Stand pivot transfers: Mod assist            Balance Overall balance assessment: History of Falls;Needs assistance Sitting-balance support: Feet supported;Single extremity supported Sitting balance-Leahy Scale: Fair Sitting balance - Comments: due to falling asleep                                   ADL either performed or assessed with clinical judgement   ADL Overall ADL's : Needs assistance/impaired Eating/Feeding: Minimal assistance Eating/Feeding Details (indicate cue type and reason): using R hand only Grooming: Minimal assistance;Sitting Grooming Details (indicate cue type and reason): using R hand only                 Toilet Transfer: BSC;Stand-pivot;Moderate assistance;Cueing for sequencing;Cueing for safety   Toileting- Clothing Manipulation and Hygiene: Maximal assistance;Cueing for safety;Sit to/from stand;Cueing for sequencing         General ADL Comments: pt much more awake this day! Pt agreed to OOB and elbow, wrist and hand exercise.                       Exercises  Pt sitting EOB and performed AROM wrist and hand. Pt perform elbow flexion with AAROM . 10 reps of each. Increased pain with elbow flexion   Shoulder Instructions Shoulder Instructions Donning/doffing sling/immobilizer: Moderate assistance Correct positioning of sling/immobilizer: Moderate assistance ROM for elbow, wrist and digits of operated UE: Moderate assistance Positioning of UE while sleeping: Moderate assistance     General Comments              Frequency  Min 2X/week        Progress Toward Goals  OT Goals(current goals can now be found in the care plan section)  Progress towards OT goals: Progressing toward goals     Plan Discharge plan needs to be updated    Co-evaluation                 AM-PAC PT "6 Clicks" Daily Activity     Outcome Measure   Help from another person eating meals?: A Lot Help from another person taking care of personal grooming?: A Lot  Help from another person toileting, which includes using toliet, bedpan, or urinal?: Total Help from another person bathing (including washing, rinsing, drying)?: Total Help from another person to put on and taking off regular upper body clothing?: Total Help from another person to put on and taking off regular lower body clothing?: Total 6 Click Score: 8    End of Session    OT Visit Diagnosis: Muscle weakness (generalized) (M62.81);History of falling (Z91.81)   Activity Tolerance Patient tolerated treatment well   Patient Left in bed;with call bell/phone within reach;with bed alarm set   Nurse Communication  Mobility status        Time: 7494-4967 OT Time Calculation (min): 29 min  Charges: OT General Charges $OT Visit: 1 Procedure OT Treatments $Self Care/Home Management : 8-22 mins $Therapeutic Exercise: 8-22 mins  [place], [place]   Payton Mccallum D 12/23/2016, 11:39 AM

## 2016-12-23 NOTE — Progress Notes (Signed)
PT Cancellation Note  Patient Details Name: Rhondalyn Clingan MRN: [id number] DOB: 11-13-1955   Cancelled Treatment:     NT in room about to perform EKG as RN reports pt had a "pause".  Pt sitting up in recliner.  Will attempt to see tomorrow.    Rica Koyanagi  PTA [facility]  Acute  Rehab Pager      [phone]

## 2016-12-24 ENCOUNTER — Encounter (HOSPITAL_COMMUNITY): Payer: Self-pay

## 2016-12-24 DIAGNOSIS — E876 Hypokalemia: Secondary | ICD-10-CM

## 2016-12-24 DIAGNOSIS — R739 Hyperglycemia, unspecified: Secondary | ICD-10-CM

## 2016-12-24 LAB — BASIC METABOLIC PANEL
Anion gap: 8 (ref 5–15)
BUN: 25 mg/dL — AB (ref 6–20)
CHLORIDE: 101 mmol/L (ref 101–111)
CO2: 24 mmol/L (ref 22–32)
Calcium: 7.8 mg/dL — ABNORMAL LOW (ref 8.9–10.3)
Creatinine, Ser: 1.78 mg/dL — ABNORMAL HIGH (ref 0.44–1.00)
GFR calc Af Amer: 35 mL/min — ABNORMAL LOW (ref >60)
GFR, EST NON AFRICAN AMERICAN: 30 mL/min — AB (ref >60)
Glucose, Bld: 284 mg/dL — ABNORMAL HIGH (ref 65–99)
Potassium: 2.9 mmol/L — ABNORMAL LOW (ref 3.5–5.1)
SODIUM: 133 mmol/L — AB (ref 135–145)

## 2016-12-24 LAB — GLUCOSE, CAPILLARY
GLUCOSE-CAPILLARY: 289 mg/dL — AB (ref 65–99)
GLUCOSE-CAPILLARY: 406 mg/dL — AB (ref 65–99)
GLUCOSE-CAPILLARY: 535 mg/dL — AB (ref 65–99)
Glucose-Capillary: 316 mg/dL — ABNORMAL HIGH (ref 65–99)
Glucose-Capillary: 250 mg/dL — ABNORMAL HIGH (ref 65–99)
Glucose-Capillary: 376 mg/dL — ABNORMAL HIGH (ref 65–99)
Glucose-Capillary: 265 mg/dL — ABNORMAL HIGH (ref 65–99)

## 2016-12-24 LAB — MAGNESIUM: Magnesium: 1.7 mg/dL (ref 1.7–2.4)

## 2016-12-24 LAB — PROTIME-INR
INR: 2.42
PROTHROMBIN TIME: 26.7 s — AB (ref 11.4–15.2)

## 2016-12-24 MED ORDER — POTASSIUM CHLORIDE 10 MEQ/100ML IV SOLN
10.0000 meq | INTRAVENOUS | Status: AC
  Administered 2016-12-24 (×4): 10 meq via INTRAVENOUS
  Filled 2016-12-24 (×4): qty 100

## 2016-12-24 MED ORDER — POTASSIUM CHLORIDE CRYS ER 20 MEQ PO TBCR
40.0000 meq | EXTENDED_RELEASE_TABLET | Freq: Every day | ORAL | Status: DC
  Administered 2016-12-24: 40 meq via ORAL
  Filled 2016-12-24: qty 2

## 2016-12-24 MED ORDER — INSULIN DETEMIR 100 UNIT/ML ~~LOC~~ SOLN
13.0000 [IU] | Freq: Two times a day (BID) | SUBCUTANEOUS | Status: DC
  Administered 2016-12-24 – 2016-12-25 (×2): 13 [IU] via SUBCUTANEOUS
  Filled 2016-12-24 (×3): qty 0.13

## 2016-12-24 MED ORDER — MAGNESIUM SULFATE IN D5W 1-5 GM/100ML-% IV SOLN
1.0000 g | Freq: Once | INTRAVENOUS | Status: AC
  Administered 2016-12-24: 1 g via INTRAVENOUS
  Filled 2016-12-24: qty 100

## 2016-12-24 NOTE — Progress Notes (Signed)
CSW consulted for potential SNF placement.  Met with pt and family at bedside. Pt is uninsured and reports private pay not an option. Wish to pursue medicaid application but none in progress (CSW spoke with financial counseling). Pt lives at home with husband and plans to return. States that she has family/friends who can assist with her care as well.  Denies further needs, CSW signing off.   Sharren Bridge, MSW, LCSW Clinical Social Work 12/24/2016 [phone]

## 2016-12-24 NOTE — Progress Notes (Signed)
Physical Therapy Treatment Patient Details Name: Amber Young MRN: [id number] DOB: 11/26/1955 Today's Date: 12/24/2016    History of Present Illness Amber Young is a 61 y.o. female with known HTN, GERD, CAD, DM, a-fin on coumadin, presented to [facility] ED after an episode of fall backwards and fell on her left arm. Proximal humeral diaphysis/neck fracture.    PT Comments    Assisted OOB to amb to bathroom then amb a greater distance in hallway.  Amb HHA + 1 pt demonstarted a good alternating gait and functional speed.  Amb with a small base quad cane (trial) howover proved to be more of a hindrance than helpful.  Demonstrated decreased gait speed and uncoordinated.  Pt better off amb in her home without any AD.    Follow Up Recommendations  SNF;Home health PT (pt plans to return home with family support)     Equipment Recommendations  None recommended by PT    Recommendations for Other Services       Precautions / Restrictions Precautions Precautions: Shoulder Shoulder Interventions: Shoulder sling/immobilizer Precaution Comments: NWB to LUE, sling for comfort, ROM OK elbow wrist and hand per MD Required Braces or Orthoses: Sling Restrictions Weight Bearing Restrictions: Yes LUE Weight Bearing: Non weight bearing    Mobility  Bed Mobility Overal bed mobility: Needs Assistance Bed Mobility: Sit to Supine;Supine to Sit     Supine to sit: Mod assist Sit to supine: Mod assist   General bed mobility comments: increased ability to self perform with HOB elevated and use of rail   Transfers Overall transfer level: Needs assistance Equipment used: 1 person hand held assist Transfers: Sit to/from [facility] Sit to Stand: Min assist Stand pivot transfers: Min assist       General transfer comment: increased time and increased ability to self perfrom.  Mild instability with use of only one arm  Ambulation/Gait Ambulation/Gait assistance: Min guard;Min  assist Ambulation Distance (Feet): 500 Feet Assistive device: None;Quad cane Gait Pattern/deviations: Step-to pattern;Step-through pattern Gait velocity: decreased   General Gait Details: increased alertness and tolerated an increased distance.  Amb HHA + 1 with mild gait instbility, increased base of support and lateral LOB x 1 with turns.  Amb with small base quad cane (trial) noted decreased gait speed, increased broken gait  flow  which posed more as a barrier than helpful.  Do not rec any AD at this time.  Pt is able to amb within the home (tight spaces/furniture) more safely than trying to use a cane.     Stairs            Wheelchair Mobility    Modified Rankin (Stroke Patients Only)       Balance Overall balance assessment: History of Falls;Needs assistance Sitting-balance support: Feet supported;Single extremity supported Sitting balance-Leahy Scale: Fair Sitting balance - Comments: due to falling asleep                                    Cognition Arousal/Alertness: Awake/alert Behavior During Therapy: WFL for tasks assessed/performed Overall Cognitive Status: Within Functional Limits for tasks assessed                                 General Comments: required increased time and VC's to arouse enough to participate      Exercises Method for sponge bathing under  operated UE: Moderate assistance Donning/doffing sling/immobilizer: Moderate assistance (caregiver educated) Correct positioning of sling/immobilizer: Caregiver independent with task;Moderate assistance ROM for elbow, wrist and digits of operated UE: Minimal assistance (caregiver needs further practice) Sling wearing schedule (on at all times/off for ADL's): Independent Positioning of UE while sleeping: Minimal assistance;Caregiver independent with task    General Comments        Pertinent Vitals/Pain Pain Assessment: Faces Pain Score: 8  Faces Pain Scale: Hurts a  little bit Pain Location: L distal Humerus Pain Descriptors / Indicators: Sore;Grimacing Pain Intervention(s): Monitored during session;Limited activity within patient's tolerance;Repositioned;Patient requesting pain meds-RN notified;RN gave pain meds during session    Home Living                      Prior Function            PT Goals (current goals can now be found in the care plan section) Progress towards PT goals: Progressing toward goals    Frequency    Min 3X/week      PT Plan Current plan remains appropriate    Co-evaluation              AM-PAC PT "6 Clicks" Daily Activity  Outcome Measure  Difficulty turning over in bed (including adjusting bedclothes, sheets and blankets)?: A Lot Difficulty moving from lying on back to sitting on the side of the bed? : A Lot Difficulty sitting down on and standing up from a chair with arms (e.g., wheelchair, bedside commode, etc,.)?: A Lot Help needed moving to and from a bed to chair (including a wheelchair)?: A Lot Help needed walking in hospital room?: Total Help needed climbing 3-5 steps with a railing? : Total 6 Click Score: 10    End of Session Equipment Utilized During Treatment: Gait belt Activity Tolerance: Patient tolerated treatment well Patient left: in bed;with bed alarm set;with call bell/phone within reach Nurse Communication:  (pt voided 100cc) PT Visit Diagnosis: Difficulty in walking, not elsewhere classified (R26.2)     Time: 5956-3875 PT Time Calculation (min) (ACUTE ONLY): 28 min  Charges:  $Gait Training: 8-22 mins $Therapeutic Activity: 8-22 mins                    G Codes:       Rica Koyanagi  PTA [facility]  Acute  Rehab Pager      [phone]

## 2016-12-24 NOTE — Progress Notes (Addendum)
ANTICOAGULATION CONSULT NOTE - Follow Up Consult  Pharmacy Consult for Warfarin Indication: atrial fibrillation  Allergies  Allergen Reactions  . Ace Inhibitors Cough    Patient Measurements: Height: 5\' 6"  (167.6 cm) Weight: 182 lb 5.1 oz (82.7 kg) IBW/kg (Calculated) : 59.3  Vital Signs: Temp: 97.6 F (36.4 C) (05/Amber 0545) Temp Source: Oral (05/Amber 0545) BP: 144/74 (05/Amber 0545) Pulse Rate: 66 (05/Amber 0545)  Labs:  Recent Labs  12/22/16 0527 12/23/16 0527 05/Amber/18 0512  HGB 11.9*  --   --   HCT 35.8*  --   --   PLT 309  --   --   LABPROT 15.4* 18.0* 26.7*  INR 1.21 1.47 2.42  CREATININE 2.26* 2.21* 1.78*    Estimated Creatinine Clearance: 36.5 mL/min (A) (by C-G formula based on SCr of 1.78 mg/dL (H)).   Medications:  Scheduled:  . acetaminophen  500 mg Oral Q6H  . carvedilol  25 mg Oral BID WC  . feeding supplement (ENSURE ENLIVE)  237 mL Oral BID BM  . insulin aspart  0-9 Units Subcutaneous TID WC  . insulin detemir  10 Units Subcutaneous BID  . lidocaine-EPINEPHrine  20 mL Infiltration Once  . mouth rinse  15 mL Mouth Rinse BID  . multivitamin with minerals  1 tablet Oral Daily  . potassium chloride  40 mEq Oral QHS  . ticagrelor  90 mg Oral BID  . Warfarin - Pharmacist Dosing Inpatient   Does not apply q1800   Infusions:  . magnesium sulfate 1 - 4 g bolus IVPB    . potassium chloride 10 mEq (05/Amber/18 1146)    Assessment: Amber Young admitted on 5/13 s/p fall with humerus fracture (no surgical intervention planned), breast abscess with cellulitis (s/p I&D in ED) and PMH significant for CHF, HTN, DM, and Afib on chronic warfarin anticoagulation.  CT head was without acute intracranial abnormality.  Pharmacy was initially consulted to continue warfarin dosing inpatient, the consult was d/c on 5/14 (no doses given 5/14) and resumed on 5/15.  Home warfarin dose:  5mg  daily except 7.5 mg on MWF Patient noted to be noncompliant with medications. Admission INR  1.06  Today, 5/Amber/2018: - INR is therapeutic but increased sharply from 1.47 to 2.42 today (after two doses of 7.5mg ) - CBC (5/15):  Hgb 11.9 is low/stable, Plt WNL - no bleeding documented - No major drug-drug interactions noted - Diet: carb modified   Goal of Therapy:  INR 2-3 Monitor platelets by anticoagulation protocol: Yes   Plan:  - with sharp rise in INR, will hold warfarin dose today - Daily PT/INR - Monitor for signs and symptoms of bleeding.   Dia Sitter, PharmD, BCPS 5/Amber/2018 12:12 PM

## 2016-12-24 NOTE — Progress Notes (Signed)
Occupational Therapy Treatment Patient Details Name: Amber Young MRN: [id number] DOB: March 17, 1956 Today's Date: 12/24/2016    History of present illness Amber Young is a 61 y.o. female with known HTN, GERD, CAD, DM, a-fin on coumadin, presented to [facility] ED after an episode of fall backwards and fell on her left arm. Proximal humeral diaphysis/neck fracture.   OT comments  Pt not able to tolerate elbow ROM due to pain this session. RN aware  Follow Up Recommendations  SNF;Supervision/Assistance - 24 hour - dghter states pt will need SNF   Equipment Recommendations  3 in 1 bedside commode    Recommendations for Other Services      Precautions / Restrictions Precautions Precautions: Shoulder Shoulder Interventions: Shoulder sling/immobilizer Precaution Comments: NWB to LUE, sling for comfort, ROM OK elbow wrist and hand per MD Restrictions Weight Bearing Restrictions: Yes LUE Weight Bearing: Non weight bearing       Mobility Bed Mobility   Bed Mobility: Sit to Supine       Sit to supine: Mod assist      Transfers Overall transfer level: Needs assistance Equipment used: 1 person hand held assist Transfers: Sit to/from [facility] Sit to Stand: Min assist Stand pivot transfers: Min assist            Balance Overall balance assessment: History of Falls;Needs assistance Sitting-balance support: Feet supported;Single extremity supported Sitting balance-Leahy Scale: Fair Sitting balance - Comments: due to falling asleep                                   ADL either performed or assessed with clinical judgement   ADL Overall ADL's : Needs assistance/impaired                         Toilet Transfer: Minimal assistance;BSC;Cueing for sequencing;Cueing for safety;Stand-pivot   Toileting- Clothing Manipulation and Hygiene: Moderate assistance;Sit to/from stand;Cueing for sequencing;Cueing for safety         General  ADL Comments: daughter present.  OT educated pt and daugher on donning and doffing sling as well as a shirt.  Pts daughter does not feel pts husband will be able to care for pt.  She needs SNF.  Family cant provide 24/7 A               Cognition Arousal/Alertness: Awake/alert Behavior During Therapy: WFL for tasks assessed/performed Overall Cognitive Status: Within Functional Limits for tasks assessed                                          Exercises     Shoulder Instructions Shoulder Instructions Method for sponge bathing under operated UE: Moderate assistance Donning/doffing sling/immobilizer: Moderate assistance (caregiver educated) Correct positioning of sling/immobilizer: Caregiver independent with task;Moderate assistance ROM for elbow, wrist and digits of operated UE: Minimal assistance (caregiver needs further practice) Sling wearing schedule (on at all times/off for ADL's): Independent Positioning of UE while sleeping: Minimal assistance;Caregiver independent with task     General Comments      Pertinent Vitals/ Pain       Pain Score: 8  Pain Location: L distal Humerus Pain Descriptors / Indicators: Sore;Grimacing Pain Intervention(s): Monitored during session;Limited activity within patient's tolerance;Repositioned;Patient requesting pain meds-RN notified;RN gave pain meds during session  Prior Functioning/Environment              Frequency  Min 2X/week        Progress Toward Goals  OT Goals(current goals can now be found in the care plan section)  Progress towards OT goals: Progressing toward goals     Plan Discharge plan needs to be updated       AM-PAC PT "6 Clicks" Daily Activity     Outcome Measure   Help from another person eating meals?: A Little Help from another person taking care of personal grooming?: A Little Help from another person toileting, which includes using toliet, bedpan, or urinal?: A Lot Help from  another person bathing (including washing, rinsing, drying)?: A Lot Help from another person to put on and taking off regular upper body clothing?: A Lot Help from another person to put on and taking off regular lower body clothing?: A Lot 6 Click Score: 14    End of Session    OT Visit Diagnosis: Muscle weakness (generalized) (M62.81);History of falling (Z91.81)   Activity Tolerance Patient limited by pain   Patient Left in bed;with call bell/phone within reach;with family/visitor present   Nurse Communication Mobility status        Time: 4196-2229 OT Time Calculation (min): 24 min  Charges: OT Treatments $Self Care/Home Management : 23-37 mins  [place], [place]   Payton Mccallum D 12/24/2016, 12:08 PM

## 2016-12-24 NOTE — Progress Notes (Signed)
Pt's CBG: 528. Pt asymptomatic, alert and oriented, VSS. On call NP made aware. New orders placed for one time dose of novolog 14 units, and one time dose of levemir 5 units. Follow up CBG's: 416 > 316. Will continue to monitor .

## 2016-12-24 NOTE — Progress Notes (Signed)
Inpatient Diabetes Program Recommendations  AACE/ADA: New Consensus Statement on Inpatient Glycemic Control (2015)  Target Ranges:  Prepandial:   less than 140 mg/dL      Peak postprandial:   less than 180 mg/dL (1-2 hours)      Critically ill patients:  140 - 180 mg/dL   Lab Results  Component Value Date   GLUCAP 289 (H) 12/24/2016   HGBA1C 15.4 (H) 12/20/2016   Late Entry from 12/23/2016 at 1600.  Review of Glycemic Control  Diabetes history: DM2 Outpatient Diabetes medications: Levemir 9 units QD Current orders for Inpatient glycemic control: Levemir 10 units bid, Novolog 0-9 units tidwc  HgbA1C of 15.4% indicates poor glycemic control PTA.  Spoke with pt on 5/16 pm and pt states she doesn't check blood sugars and only takes a small portion of her Levemir. Does not have insurance and can't afford her meds. Will need affordable insulin at discharge. Pt seemed to have a depressed affect. States she hasn't been taking care of herself and says "I know I need to."  Inpatient Diabetes Program Recommendations:    Novolin 70/30 insulin 18 units bid. Will need Novolin R prescription for s/s. Need to f/u with PCP at [facility] Dept.  Continue to follow while inpatient.  Thank you. Lorenda Peck, RD, LDN, CDE Inpatient Diabetes Coordinator [phone]

## 2016-12-24 NOTE — Progress Notes (Signed)
PROGRESS NOTE [facility]   Zoye Chandra   OVZ:[id number] DOB: [birth year]  DOA: 12/20/2016 PCP: [facility]   Brief Narrative:  61 year old female with history of hypertension and type 2 diabetes mellitus and A. fib on anticoagulation presented to the emergency department after sustaining a mechanical fall and left arm pain. Patient was found to have a right humeral fracture and was admitted for pain management and also found to have breast cellulitis/abscess. Orthopedic team was consulted and recommended conservative treatment. Surgical team was consulted for cellulitis/abscess and recommended I&D of the lesions.  Subjective: Patient seen and examined, doing well. Have no pain, awaiting for I&D. No acute events overnight. Denies chest pain, SOB, and palpations.   Assessment & Plan: Proximal humeral fracture Continue immobilization, candidate for surgical intervention per orthopedics recommendation. Patient was oversedated with narcotics, pain regimen was changed to tramadol and scheduled Tylenol PT recommending SNF but patient does not have insurance and unable to afford it. Will be d/c home with [facility] PT   Breast abscess with cellulitis  CT chest distress cystic lesion, abx were d/ced Supposed to have I&D but surgical team unable to performed, case discussed and this does not need to be done during hospital stay, can be schedule as outpatient.   Hypokalemia  Unclear etiology continue to trend down  Replete with IV and oral form  Will schedule Kdur  Check in am  Hypertension -blood pressure well controlled Continue current regimen  A. Fib - rate control on anticoagulation with Coumadin Monitor INR  Continue Tele  Diabetes mellitus type 2 - not well-controlled, CBG'c continues to be elevated  A1c 15.4 on 12/20/2016 Having poorly controlled glucose will interfere with wound healing. Increase insulin Levemir to 13 units twice a day Continue sliding  scale Monitor SSI  AKI - suspect prerenal due to poor oral intake FeNa+ pre-renal  Cr continues to improve  Renal US normal  Holding losartan Check Cr in AM if continues to improve will d/c in AM  Encourage oral hydration   DVT prophylaxis: Warfarin Code Status: Full Family Communication: None at bedside Disposition Plan: If stable will d/c with home health PT in AM   Consultants:   Gen. surgery  Procedures:   None  Antimicrobials: Antibiotics Given (last 72 hours)    None      Objective: Vitals:   12/23/16 2013 12/23/16 2154 12/24/16 0545 12/24/16 1440  BP: (!) 144/63 (!) 155/69 (!) 144/74 128/82  Pulse: 74 64 66 66  Resp: 18 18 18 18   Temp: 98.2 F (36.8 C) 98.3 F (36.8 C) 97.6 F (36.4 C) 97.6 F (36.4 C)  TempSrc: Oral Oral Oral Oral  SpO2: 96% 98% 97% 100%  Weight:   82.7 kg (182 lb 5.1 oz)   Height:        Intake/Output Summary (Last 24 hours) at 12/24/16 1645 Last data filed at 12/24/16 1510  Gross per 24 hour  Intake             2140 ml  Output             1000 ml  Net             1140 ml   Filed Weights   12/22/16 0426 12/23/16 0417 12/24/16 0545  Weight: 80.7 kg (177 lb 14.4 oz) 80.2 kg (176 lb 12.9 oz) 82.7 kg (182 lb 5.1 oz)    Examination:  General exam: NAD Respiratory system: CTA b/l  Cardiovascular system: S1S2 no  murmurs  Gastrointestinal system: Abd soft NTND  Central nervous system: AAOx3  Extremities: Left arm on sling, good range of movement of fingers Skin:     Data Reviewed: I have personally reviewed following labs and imaging studies  CBC:  Recent Labs Lab 12/20/16 1320 12/20/16 1605 12/21/16 0557 12/22/16 0527  WBC 8.4  --  8.1 10.7*  NEUTROABS 6.7  --   --  8.3*  HGB 11.4* 11.2* 10.2* 11.9*  HCT 33.9* 33.0* 30.9* 35.8*  MCV 73.5*  --  73.7* 72.3*  PLT 300  --  275 921   Basic Metabolic Panel:  Recent Labs Lab 12/20/16 1320 12/20/16 1605 12/20/16 1856 12/21/16 0557 12/22/16 0527 12/23/16 0527  12/24/16 0512 12/24/16 0515  NA 136 136  --  132* 134* 134* 133*  --   K 2.6* 2.8*  --  3.8 3.2* 3.1* 2.9*  --   CL 98* 95*  --  95* 96* 99* 101  --   CO2 26  --   --  26 23 21* 24  --   GLUCOSE 455* 436*  --  363* 164* 227* 284*  --   BUN 11 10  --  16 22* 23* 25*  --   CREATININE 0.94 0.80  --  1.51* 2.26* 2.21* 1.78*  --   CALCIUM 8.2*  --   --  8.3* 8.8* 8.1* 7.8*  --   MG  --   --  1.4* 2.1  --   --   --  1.7  PHOS  --   --  3.7  --   --   --   --   --    GFR: Estimated Creatinine Clearance: 36.5 mL/min (A) (by C-G formula based on SCr of 1.78 mg/dL (H)). Liver Function Tests:  Recent Labs Lab 12/20/16 1320  AST 20  ALT 15  ALKPHOS 127*  BILITOT 0.9  PROT 6.4*  ALBUMIN 2.5*   No results for input(s): LIPASE, AMYLASE in the last 168 hours. No results for input(s): AMMONIA in the last 168 hours. Coagulation Profile:  Recent Labs Lab 12/20/16 1332 12/21/16 0557 12/22/16 0527 12/23/16 0527 12/24/16 0512  INR 1.06 1.12 1.21 1.47 2.42   Cardiac Enzymes: No results for input(s): CKTOTAL, CKMB, CKMBINDEX, TROPONINI in the last 168 hours. BNP (last 3 results) No results for input(s): PROBNP in the last 8760 hours. HbA1C: No results for input(s): HGBA1C in the last 72 hours. CBG:  Recent Labs Lab 12/23/16 2316 12/24/16 0005 12/24/16 0214 12/24/16 0738 12/24/16 1125  GLUCAP 416* 406* 316* 250* 289*   Lipid Profile: No results for input(s): CHOL, HDL, LDLCALC, TRIG, CHOLHDL, LDLDIRECT in the last 72 hours. Thyroid Function Tests: No results for input(s): TSH, T4TOTAL, FREET4, T3FREE, THYROIDAB in the last 72 hours. Anemia Panel: No results for input(s): VITAMINB12, FOLATE, FERRITIN, TIBC, IRON, RETICCTPCT in the last 72 hours. Sepsis Labs:  Recent Labs Lab 12/20/16 1648  LATICACIDVEN 1.42    Recent Results (from the past 240 hour(s))  Wound or Superficial Culture     Status: None   Collection Time: 12/20/16  4:36 PM  Result Value Ref Range Status    Specimen Description ABSCESS  Final   Special Requests NONE  Final   Gram Stain   Final    MODERATE WBC PRESENT, PREDOMINANTLY PMN ABUNDANT GRAM POSITIVE COCCI IN PAIRS IN CHAINS IN CLUSTERS GRAM NEGATIVE RODS    Culture   Final    NORMAL SKIN FLORA Performed at [facility]  [facility], [place] [address] [place], [place] [address]    Report Status 12/22/2016 FINAL  Final  Blood culture (routine x 2)     Status: None (Preliminary result)   Collection Time: 12/20/16  4:43 PM  Result Value Ref Range Status   Specimen Description BLOOD RIGHT ANTECUBITAL  Final   Special Requests   Final    BOTTLES DRAWN AEROBIC AND ANAEROBIC Blood Culture adequate volume   Culture   Final    NO GROWTH 4 DAYS Performed at [facility], [place] [address] [place], [place] [address]    Report Status PENDING  Incomplete  Blood culture (routine x 2)     Status: None (Preliminary result)   Collection Time: 12/20/16  4:58 PM  Result Value Ref Range Status   Specimen Description BLOOD RIGHT HAND  Final   Special Requests   Final    BOTTLES DRAWN AEROBIC AND ANAEROBIC Blood Culture adequate volume   Culture   Final    NO GROWTH 4 DAYS Performed at [facility], [place] [address], [place], [place] [address]    Report Status PENDING  Incomplete         Radiology Studies: US Renal  Result Date: 12/23/2016 CLINICAL DATA:  Acute kidney injury, history of diabetes and hypertension EXAM: RENAL / URINARY TRACT ULTRASOUND COMPLETE COMPARISON:  None. FINDINGS: Right Kidney: Length: 11.8 cm. Echogenicity within normal limits. No mass or hydronephrosis visualized. Left Kidney: Length: 12.2 cm. Echogenicity within normal limits. No mass or hydronephrosis visualized. Bladder: Appears normal for degree of bladder distention. IMPRESSION: Normal renal ultrasound Electronically Signed   By: Donavan Foil M.D.   On: 12/23/2016 20:25    Scheduled Meds: . acetaminophen  500 mg Oral Q6H  . carvedilol  25 mg Oral BID WC    . feeding supplement (ENSURE ENLIVE)  237 mL Oral BID BM  . insulin aspart  0-9 Units Subcutaneous TID WC  . insulin detemir  10 Units Subcutaneous BID  . lidocaine-EPINEPHrine  20 mL Infiltration Once  . mouth rinse  15 mL Mouth Rinse BID  . multivitamin with minerals  1 tablet Oral Daily  . potassium chloride  40 mEq Oral QHS  . ticagrelor  90 mg Oral BID  . Warfarin - Pharmacist Dosing Inpatient   Does not apply q1800   Continuous Infusions:    LOS: 4 days    Chipper Oman, MD Pager: Text Page via [email or web address]  [phone]  If 7PM-7AM, please contact night-coverage [email or web address] Password [facility] 12/24/2016, 4:45 PM

## 2016-12-25 DIAGNOSIS — L720 Epidermal cyst: Secondary | ICD-10-CM

## 2016-12-25 DIAGNOSIS — E118 Type 2 diabetes mellitus with unspecified complications: Secondary | ICD-10-CM

## 2016-12-25 LAB — BASIC METABOLIC PANEL
Anion gap: 9 (ref 5–15)
BUN: 21 mg/dL — AB (ref 6–20)
CHLORIDE: 102 mmol/L (ref 101–111)
CO2: 25 mmol/L (ref 22–32)
Calcium: 8.3 mg/dL — ABNORMAL LOW (ref 8.9–10.3)
Creatinine, Ser: 1.3 mg/dL — ABNORMAL HIGH (ref 0.44–1.00)
GFR calc Af Amer: 51 mL/min — ABNORMAL LOW (ref >60)
GFR calc non Af Amer: 44 mL/min — ABNORMAL LOW (ref >60)
Glucose, Bld: 128 mg/dL — ABNORMAL HIGH (ref 65–99)
POTASSIUM: 3.8 mmol/L (ref 3.5–5.1)
SODIUM: 136 mmol/L (ref 135–145)

## 2016-12-25 LAB — MAGNESIUM: Magnesium: 1.9 mg/dL (ref 1.7–2.4)

## 2016-12-25 LAB — CULTURE, BLOOD (ROUTINE X 2)
CULTURE: NO GROWTH
Culture: NO GROWTH
SPECIAL REQUESTS: ADEQUATE
SPECIAL REQUESTS: ADEQUATE

## 2016-12-25 LAB — GLUCOSE, CAPILLARY
GLUCOSE-CAPILLARY: 123 mg/dL — AB (ref 65–99)
GLUCOSE-CAPILLARY: 184 mg/dL — AB (ref 65–99)

## 2016-12-25 LAB — PROTIME-INR
INR: 1.67
PROTHROMBIN TIME: 19.9 s — AB (ref 11.4–15.2)

## 2016-12-25 LAB — PHOSPHORUS: Phosphorus: 2.9 mg/dL (ref 2.5–4.6)

## 2016-12-25 MED ORDER — TRAMADOL HCL 50 MG PO TABS: 50.0000 mg | ORAL_TABLET | Freq: Four times a day (QID) | ORAL | 0 refills | Status: DC | PRN

## 2016-12-25 MED ORDER — CARVEDILOL 25 MG PO TABS: 25.0000 mg | ORAL_TABLET | Freq: Two times a day (BID) | ORAL | 0 refills | Status: DC

## 2016-12-25 MED ORDER — POTASSIUM CHLORIDE CRYS ER 20 MEQ PO TBCR: 40.0000 meq | EXTENDED_RELEASE_TABLET | Freq: Two times a day (BID) | ORAL | 0 refills | Status: DC

## 2016-12-25 MED ORDER — INSULIN DETEMIR 100 UNIT/ML ~~LOC~~ SOLN: 13.0000 [IU] | Freq: Two times a day (BID) | SUBCUTANEOUS | 11 refills | Status: DC

## 2016-12-25 MED ORDER — WARFARIN SODIUM 5 MG PO TABS: 7.5000 mg | ORAL_TABLET | Freq: Once | ORAL | Status: DC

## 2016-12-25 NOTE — Progress Notes (Signed)
ANTICOAGULATION CONSULT NOTE - Follow Up Consult  Pharmacy Consult for Warfarin Indication: atrial fibrillation  Allergies  Allergen Reactions  . Ace Inhibitors Cough    Patient Measurements: Height: 5\' 6"  (167.6 cm) Weight: 180 lb 8.9 oz (81.9 kg) IBW/kg (Calculated) : 59.3  Vital Signs: Temp: 98 F (36.7 C) (05/18 0444) Temp Source: Oral (05/18 0444) BP: 146/85 (05/18 0444) Pulse Rate: 65 (05/18 0444)  Labs:  Recent Labs  12/23/16 0527 12/24/16 0512 12/25/16 0825  LABPROT 18.0* 26.7* 19.9*  INR 1.47 2.42 1.67  CREATININE 2.21* 1.78* 1.30*    Estimated Creatinine Clearance: 49.6 mL/min (A) (by C-G formula based on SCr of 1.3 mg/dL (H)).   Medications:  Scheduled:  . acetaminophen  500 mg Oral Q6H  . carvedilol  25 mg Oral BID WC  . insulin aspart  0-9 Units Subcutaneous TID WC  . insulin detemir  13 Units Subcutaneous BID  . lidocaine-EPINEPHrine  20 mL Infiltration Once  . mouth rinse  15 mL Mouth Rinse BID  . multivitamin with minerals  1 tablet Oral Daily  . potassium chloride  40 mEq Oral QHS  . ticagrelor  90 mg Oral BID  . Warfarin - Pharmacist Dosing Inpatient   Does not apply q1800   Infusions:    Assessment: 34 yoF admitted on 5/13 s/p fall with humerus fracture (no surgical intervention planned), breast abscess with cellulitis (s/p I&D in ED) and PMH significant for CHF, HTN, DM, and Afib on chronic warfarin anticoagulation.  CT head was without acute intracranial abnormality.  Pharmacy was initially consulted to continue warfarin dosing inpatient, the consult was d/c on 5/14 (no doses given 5/14) and resumed on 5/15.  Home warfarin dose:  5mg  daily except 7.5 mg on MWF Patient noted to be noncompliant with medications. Admission INR 1.06  Today, 12/25/2016: - INR down to 1.67 after dose held yesterday (d/t sharp rise in INR over night) - CBC (5/15):  Hgb 11.9 is low/stable, Plt WNL - no bleeding documented - No major drug-drug interactions  noted - Diet: carb modified   Goal of Therapy:  INR 2-3 Monitor platelets by anticoagulation protocol: Yes   Plan:  - warfarin 7.5 mg PO x1 today.  If to d/c home today, recom to resume prior to admission regimen - Daily PT/INR - Monitor for signs and symptoms of bleeding.   Dia Sitter, PharmD, BCPS 12/25/2016 11:40 AM

## 2016-12-25 NOTE — Progress Notes (Signed)
Physical Therapy Treatment Patient Details Name: Amber Young MRN: [id number] DOB: Aug 25, 1955 Today's Date: 12/25/2016    History of Present Illness Amber Young is a 61 y.o. female with known HTN, GERD, CAD, DM, a-fin on coumadin, presented to [facility] ED after an episode of fall backwards and fell on her left arm. Proximal humeral diaphysis/neck fracture.    PT Comments    Pt bright and cheerful excited to go home today.  Assisted with amb a great distance in hallway and practiced going up/down 4 steps using one rail.  Pt asking about arm exercises and how to get dressed.  Pt would bebefit from an OT session prior to D/C.    Follow Up Recommendations  Home health PT (pt plans to D/C to home with spouse and saughter help some but both work)     Optician, dispensing  Other (comment)    Recommendations for Other Services       Precautions / Restrictions Precautions Precautions: Shoulder Shoulder Interventions: Shoulder sling/immobilizer Precaution Comments: NWB to LUE, sling for comfort, ROM OK elbow wrist and hand per MD Required Braces or Orthoses: Sling Restrictions Weight Bearing Restrictions: Yes LUE Weight Bearing: Non weight bearing    Mobility  Bed Mobility               General bed mobility comments: Pt OOB in recliner   Transfers Overall transfer level: Needs assistance Equipment used: 1 person hand held assist Transfers: Sit to/from [facility] Sit to Stand: Supervision;Min guard Stand pivot transfers: Supervision;Min guard       General transfer comment: increased safety and ability to self rise using only R UE (L UE sling).  Performed well.   Ambulation/Gait Ambulation/Gait assistance: Supervision;Min guard Ambulation Distance (Feet): 500 Feet Assistive device: None Gait Pattern/deviations: Step-through pattern;Wide base of support Gait velocity: WFL   General Gait Details: good alternating gait with slightly increased  BOS/stance no need for any AD.     Stairs Stairs: Yes   Stair Management: One rail Right;Step to pattern;Forwards Number of Stairs: 4 General stair comments: 25% VC's on safety and proper tech    tolerated well using one r rail and step to pattern.  decending stairs was more difficult.    Wheelchair Mobility    Modified Rankin (Stroke Patients Only)       Balance                                            Cognition Arousal/Alertness: Awake/alert Behavior During Therapy: WFL for tasks assessed/performed Overall Cognitive Status: Within Functional Limits for tasks assessed                                 General Comments: wide awake and excited to go home today      Exercises      General Comments        Pertinent Vitals/Pain Pain Assessment: Faces Faces Pain Scale: Hurts little more Pain Location: L distal Humerus Pain Descriptors / Indicators: Grimacing Pain Intervention(s): Monitored during session    Home Living                      Prior Function            PT Goals (current goals can now be found in  the care plan section) Progress towards PT goals: Progressing toward goals    Frequency    Min 3X/week      PT Plan Current plan remains appropriate    Co-evaluation              AM-PAC PT "6 Clicks" Daily Activity  Outcome Measure  Difficulty turning over in bed (including adjusting bedclothes, sheets and blankets)?: A Little Difficulty moving from lying on back to sitting on the side of the bed? : A Little Difficulty sitting down on and standing up from a chair with arms (e.g., wheelchair, bedside commode, etc,.)?: A Little Help needed moving to and from a bed to chair (including a wheelchair)?: A Little Help needed walking in hospital room?: A Little Help needed climbing 3-5 steps with a railing? : A Little 6 Click Score: 18    End of Session Equipment Utilized During Treatment: Gait  belt Activity Tolerance: Patient tolerated treatment well Patient left: in chair;with call bell/phone within reach Nurse Communication:  (pt did well and practiced stairs) PT Visit Diagnosis: Difficulty in walking, not elsewhere classified (R26.2)     Time: 9629-5284 PT Time Calculation (min) (ACUTE ONLY): 15 min  Charges:  $Gait Training: 8-22 mins                    G Codes:       Amber Young  PTA [facility]  Acute  Rehab Pager      [phone]

## 2016-12-25 NOTE — Discharge Summary (Signed)
Physician Discharge Summary  Amber Young  ZWC:[id number]  DOB: 1956/06/20  DOA: 12/20/2016 PCP: Sandria Manly [place] date: 2/35/3614 Discharge date: 12/25/2016  Admitted From: Home  Disposition:  Home   Recommendations for Outpatient Follow-up:  1. Follow up with PCP in 1-2 weeks 2. Please obtain BMP/CBC in one week 3. Follow up with Gen Surgery Dr. Tera Helper in 1 week  4. Follow up with Dr. Stann Mainland in 2 weeks to repeat x-ray   Home Health: RN  Equipment/Devices: None    Discharge Condition: Stable  CODE STATUS: FULL  Diet recommendation: Heart Healthy / Carb Modified   Brief/Interim Summary: 61 year old female with history of hypertension and type 2 diabetes mellitus and A. fib on anticoagulation presented to the emergency department after sustaining a mechanical fall and left arm pain. Patient was found to have a right humeral fracture and was admitted for pain management and also found to have breast cellulitis/abscess. Orthopedic team was consulted and recommended conservative treatment. Surgical team was consulted for cellulitis/abscess and recommended I&D of the lesions although subsequently it was found that the lesions where a epidermal/sebaceous cyst and they recommended follow up as outpatient for possible excision. Patient was treated with IVF for AKI which has been improving. Patient continues to be clinically stable, no elevated WBC, no fever, or signs of inflammatory response, she will be discharge home to follow up with general surgery and orthopedic team.   Subjective: Patient seen and examined, have no complaints this morning. No acute events overnight, afebrile, and tolerating diet well.   Discharge Diagnoses/Hospital Course:  Proximal humeral fracture Continue immobilization, not candidate for surgical intervention per orthopedics recommendation. Patient was oversedated with narcotics, pain regimen was changed to tramadol and scheduled Tylenol -  Pain well controlled  PT recommending SNF but patient does not have insurance and unable to afford it. D/c home with [facility] PT   Epidermal inclusion cyst/sebaceous cyst with cellulitis  CT chest shows cystic lesion, abx were d/ced No fever, normal wbc and no signs of inflammatory response no abx needed at this time  Case discussed with surgical team PA Creig Hines - recommending to follow up with Dr Tera Helper for surgical excision next week - surgical team arranging appointment  Clean sites with soap and water. Keep dry and clean till you see him in the office Apply heat pads 2-3 times daily   Hypokalemia - resolved  Repleted with IV and oral form  Will continue K dur 20 mEq daily  Follow up with PCP   Hypertension -blood pressure well controlled No changes in home medication was made  Continue current regimen   A. Fib - rate control on anticoagulation with Coumadin INR therapeutic  Continue current regimen   Diabetes mellitus type 2 - CBG better with increased insulin  A1c 15.4 on 12/20/2016 Having poorly controlled glucose will interfere with wound healing. Continue insulin Levemir to 13 units twice a day Monitor CBG's at home goal <140 fasting   AKI - suspect prerenal due to poor oral intake FeNa+ pre-renal  Cr continues to improve upon discharge 1.30 Renal US normal  Will continue to hold losartan and lasix until seen by PCP  Encourage oral hydration   All other chronic medical condition were stable during the hospitalization.  Patient was seen by physical therapy, recommending home health PT  On the day of the discharge the patient's vitals were stable, and no other acute medical condition were reported by patient. Patient was felt safe to be  discharge to home given family support.   Discharge Instructions  You were cared for by a hospitalist during your hospital stay. If you have any questions about your discharge medications or the care you received while you were in the  hospital after you are discharged, you can call the unit and asked to speak with the hospitalist on call if the hospitalist that took care of you is not available. Once you are discharged, your primary care physician will handle any further medical issues. Please note that NO REFILLS for any discharge medications will be authorized once you are discharged, as it is imperative that you return to your primary care physician (or establish a relationship with a primary care physician if you do not have one) for your aftercare needs so that they can reassess your need for medications and monitor your lab values.  Discharge Instructions    (HEART FAILURE PATIENTS) Call MD:  Anytime you have any of the following symptoms: 1) 3 pound weight gain in 24 hours or 5 pounds in 1 week 2) shortness of breath, with or without a dry hacking cough 3) swelling in the hands, feet or stomach 4) if you have to sleep on extra pillows at night in order to breathe.    Complete by:  As directed    Call MD for:  difficulty breathing, headache or visual disturbances    Complete by:  As directed    Call MD for:  extreme fatigue    Complete by:  As directed    Call MD for:  hives    Complete by:  As directed    Call MD for:  persistant dizziness or light-headedness    Complete by:  As directed    Call MD for:  persistant nausea and vomiting    Complete by:  As directed    Call MD for:  redness, tenderness, or signs of infection (pain, swelling, redness, odor or green/yellow discharge around incision site)    Complete by:  As directed    Call MD for:  severe uncontrolled pain    Complete by:  As directed    Call MD for:  temperature >100.4    Complete by:  As directed    Diet - low sodium heart healthy    Complete by:  As directed    Increase activity slowly    Complete by:  As directed      Allergies as of 12/25/2016      Reactions   Ace Inhibitors Cough      Medication List    STOP taking these medications     furosemide 40 MG tablet Commonly known as:  LASIX   losartan 100 MG tablet Commonly known as:  COZAAR     TAKE these medications   BLOOD GLUCOSE TEST STRIPS Strp Use as directed   carvedilol 25 MG tablet Commonly known as:  COREG Take 1 tablet (25 mg total) by mouth 2 (two) times daily.   insulin detemir 100 UNIT/ML injection Commonly known as:  LEVEMIR Inject 0.13 mLs (13 Units total) into the skin 2 (two) times daily. What changed:  how much to take  when to take this   Insulin Pen Needle 29G X 10MM Misc Use as directed   potassium chloride SA 20 MEQ tablet Commonly known as:  K-DUR,KLOR-CON Take 2 tablets (40 mEq total) by mouth 2 (two) times daily.   ticagrelor 90 MG Tabs tablet Commonly known as:  BRILINTA Take 1 tablet (90  mg total) by mouth 2 (two) times daily.   traMADol 50 MG tablet Commonly known as:  ULTRAM Take 1 tablet (50 mg total) by mouth every 6 (six) hours as needed for severe pain.   warfarin 5 MG tablet Commonly known as:  COUMADIN Take 1 tablet (5 mg total) by mouth daily at 6 PM. What changed:  how much to take  additional instructions      Follow-up Information    [facility] Follow up on 12/29/2016.   Specialty:  Internal Medicine Why:  Appointment at 10 AM. Please keep this appointment or call to cancel. Contact information: [place]       Armandina Gemma, MD Follow up.   Specialty:  General Surgery Why:  Call for an appointment and he will take care of these in the office. Clean sites with soap and water.  Keep dry and clean till you see him in the office. Contact information: [address] [place] [address] [phone]        Nicholes Stairs, MD. Schedule an appointment as soon as possible for a visit in 2 week(s).   Specialty:  Orthopedic Surgery Why:  Follow up Humeral fracture - need to repeat xray  Contact  information: [address] [place] [address] [phone]          Allergies  Allergen Reactions  . Ace Inhibitors Cough    Consultations:  General Surgery   Orthopedic Surgery    Procedures/Studies: Dg Chest 2 View  Result Date: 12/02/2016 CLINICAL DATA:  Chest pain and shortness of Breath EXAM: CHEST  2 VIEW COMPARISON:  04/26/2016 FINDINGS: Cardiac shadow is mildly prominent but stable. Previously seen vascular congestion has resolved in the interval. No focal infiltrate or sizable effusion is noted. Very minimal basilar atelectasis is seen. No bony abnormality is noted. IMPRESSION: Minimal basilar atelectasis. Significant improvement in the degree of congestive failure when compared with the prior study. Electronically Signed   By: Inez Catalina M.D.   On: 12/02/2016 19:03   Ct Head Wo Contrast  Result Date: 12/20/2016 CLINICAL DATA:  Pain after fall. EXAM: CT HEAD WITHOUT CONTRAST CT CERVICAL SPINE WITHOUT CONTRAST TECHNIQUE: Multidetector CT imaging of the head and cervical spine was performed following the standard protocol without intravenous contrast. Multiplanar CT image reconstructions of the cervical spine were also generated. COMPARISON:  September 30, 2014 FINDINGS: CT HEAD FINDINGS Brain: No subdural, epidural, or subarachnoid hemorrhages identified. Ventricles and sulci are prominent but stable. Focal encephalomalacia in the posterior left parietal lobe on series 2, image 23 is unchanged and chronic. Mild white matter changes. No acute cortical ischemia or infarct identified. No mass, mass effect, or midline shift. Vascular: Calcified atherosclerosis is seen in the intracranial carotid arteries. Skull: Normal. Negative for fracture or focal lesion. Sinuses/Orbits: No acute finding. Other: A focus of air in the right anterior soft tissues on series 2, image 317 could be posttraumatic or air within a vessel. No significant soft tissue swelling is seen in the  extracranial soft tissues. The orbits are intact. CT CERVICAL SPINE FINDINGS Alignment: Normal. Skull base and vertebrae: No acute fracture. No primary bone lesion or focal pathologic process. Soft tissues and spinal canal: No prevertebral fluid or swelling. No visible canal hematoma. Disc levels:  No significant degenerative changes. Upper chest: Negative. Other: No other abnormalities are identified. IMPRESSION: 1. No acute intracranial abnormality. 2. No  fracture or traumatic malalignment in the cervical spine. Electronically Signed   By: Dorise Bullion III M.D   On: 12/20/2016 16:10   Ct Chest W Contrast  Result Date: 12/20/2016 CLINICAL DATA:  Fall, chest bruising/swelling, left humerus fracture EXAM: CT CHEST WITH CONTRAST TECHNIQUE: Multidetector CT imaging of the chest was performed during intravenous contrast administration. CONTRAST:  75 mL Isovue 300 IV COMPARISON:  Chest radiographs dated 12/02/2016. Left humerus radiographs dated 12/20/2016. CT chest dated 11/15/2015. FINDINGS: Cardiovascular: Mild cardiomegaly.  No pericardial effusion. Coronary atherosclerosis of the LAD. No evidence of thoracic aortic aneurysm. Very mild atherosclerotic calcifications. Mediastinum/Nodes: No suspicious mediastinal lymphadenopathy. Fluid along the mid/ distal esophagus. Visualized left thyroid is notable for a 10 mm nodule. Lungs/Pleura: Evaluation lung parenchyma is mildly constrained by respiratory motion. Mild atelectasis in the bilateral lower lobes. No suspicious pulmonary nodules. No focal consolidation. Small left pleural effusion. No pneumothorax. Upper Abdomen: Visualized upper abdomen is unremarkable. Musculoskeletal: Subcutaneous cystic lesion along the right anterior chest wall measuring 3.1 x 4.3 cm (series 2/image 116), grossly unchanged from 2017, likely reflecting a benign sebaceous cyst. Additional subcutaneous cystic lesion along the left anterior chest wall measuring 2.8 x 5.8 cm (series 2/image  110) and is notable for a mildly thickened rim and at least one septation (series 2/ image 111). This previously measured 1.6 cm in 2017, when the appearance was compatible with a sebaceous cyst. Given the clinical history, superimposed hematoma/seroma is possible. Mild degenerative changes of the thoracic spine. No evidence of sternal fracture. Comminuted left proximal humeral fracture (series 2/ image 33), incompletely visualized. IMPRESSION: Comminuted left proximal humeral fracture, incompletely visualized. 5.8 cm subcutaneous cystic lesion along the left anterior chest wall, possibly reflecting hematoma/ seroma superimposed upon a pre-existing sebaceous cyst. Given interval growth, attention on follow-up is suggested. Small left pleural effusion. Electronically Signed   By: Julian Hy M.D.   On: 12/20/2016 16:10   Ct Cervical Spine Wo Contrast  Result Date: 12/20/2016 CLINICAL DATA:  Pain after fall. EXAM: CT HEAD WITHOUT CONTRAST CT CERVICAL SPINE WITHOUT CONTRAST TECHNIQUE: Multidetector CT imaging of the head and cervical spine was performed following the standard protocol without intravenous contrast. Multiplanar CT image reconstructions of the cervical spine were also generated. COMPARISON:  September 30, 2014 FINDINGS: CT HEAD FINDINGS Brain: No subdural, epidural, or subarachnoid hemorrhages identified. Ventricles and sulci are prominent but stable. Focal encephalomalacia in the posterior left parietal lobe on series 2, image 23 is unchanged and chronic. Mild white matter changes. No acute cortical ischemia or infarct identified. No mass, mass effect, or midline shift. Vascular: Calcified atherosclerosis is seen in the intracranial carotid arteries. Skull: Normal. Negative for fracture or focal lesion. Sinuses/Orbits: No acute finding. Other: A focus of air in the right anterior soft tissues on series 2, image 317 could be posttraumatic or air within a vessel. No significant soft tissue  swelling is seen in the extracranial soft tissues. The orbits are intact. CT CERVICAL SPINE FINDINGS Alignment: Normal. Skull base and vertebrae: No acute fracture. No primary bone lesion or focal pathologic process. Soft tissues and spinal canal: No prevertebral fluid or swelling. No visible canal hematoma. Disc levels:  No significant degenerative changes. Upper chest: Negative. Other: No other abnormalities are identified. IMPRESSION: 1. No acute intracranial abnormality. 2. No fracture or traumatic malalignment in the cervical spine. Electronically Signed   By: Dorise Bullion III M.D   On: 12/20/2016 16:10   US Renal  Result Date: 12/23/2016 CLINICAL DATA:  Acute kidney injury, history of diabetes and hypertension EXAM: RENAL / URINARY TRACT ULTRASOUND COMPLETE COMPARISON:  None. FINDINGS: Right Kidney: Length: 11.8 cm. Echogenicity within normal limits. No mass or hydronephrosis visualized. Left Kidney: Length: 12.2 cm. Echogenicity within normal limits. No mass or hydronephrosis visualized. Bladder: Appears normal for degree of bladder distention. IMPRESSION: Normal renal ultrasound Electronically Signed   By: Donavan Foil M.D.   On: 12/23/2016 20:25   Dg Humerus Left  Result Date: 12/20/2016 CLINICAL DATA:  Pain after trauma. EXAM: LEFT HUMERUS - 2+ VIEW COMPARISON:  None. FINDINGS: There is an angulated comminuted fracture of the proximal humerus without obvious humeral head dislocation. If there is concern for humeral head dislocation, recommend dedicated images of the shoulder. IMPRESSION: Comminuted angulated fracture of the proximal humeral diaphysis/neck. Electronically Signed   By: Dorise Bullion III M.D   On: 12/20/2016 13:08    Discharge Exam: Vitals:   12/24/16 2017 12/25/16 0444  BP: 122/78 (!) 146/85  Pulse: 66 65  Resp: 19 20  Temp: 97.6 F (36.4 C) 98 F (36.7 C)   Vitals:   12/24/16 1440 12/24/16 2017 12/25/16 0444 12/25/16 0445  BP: 128/82 122/78 (!) 146/85   Pulse:  66 66 65   Resp: 18 19 20    Temp: 97.6 F (36.4 C) 97.6 F (36.4 C) 98 F (36.7 C)   TempSrc: Oral Oral Oral   SpO2: 100% 100% 100%   Weight:    81.9 kg (180 lb 8.9 oz)  Height:        General: Pt is alert, awake, not in acute distress Cardiovascular: RRR, S1/S2 +, no rubs, no gallops Respiratory: CTA bilaterally, no wheezing, no rhonchi Abdominal: Soft, NT, ND, bowel sounds + Extremities: no edema, no cyanosis, L arm on sling, good ROM      The results of significant diagnostics from this hospitalization (including imaging, microbiology, ancillary and laboratory) are listed below for reference.     Microbiology: Recent Results (from the past 240 hour(s))  Wound or Superficial Culture     Status: None   Collection Time: 12/20/16  4:36 PM  Result Value Ref Range Status   Specimen Description ABSCESS  Final   Special Requests NONE  Final   Gram Stain   Final    MODERATE WBC PRESENT, PREDOMINANTLY PMN ABUNDANT GRAM POSITIVE COCCI IN PAIRS IN CHAINS IN CLUSTERS GRAM NEGATIVE RODS    Culture   Final    NORMAL SKIN FLORA Performed at [facility], [place] [address] [place], [place] [address]    Report Status 12/22/2016 FINAL  Final  Blood culture (routine x 2)     Status: None (Preliminary result)   Collection Time: 12/20/16  4:43 PM  Result Value Ref Range Status   Specimen Description BLOOD RIGHT ANTECUBITAL  Final   Special Requests   Final    BOTTLES DRAWN AEROBIC AND ANAEROBIC Blood Culture adequate volume   Culture   Final    NO GROWTH 4 DAYS Performed at [facility], [place] [address] [place], [place] [address]    Report Status PENDING  Incomplete  Blood culture (routine x 2)     Status: None (Preliminary result)   Collection Time: 12/20/16  4:58 PM  Result Value Ref Range Status   Specimen Description BLOOD RIGHT HAND  Final   Special Requests   Final    BOTTLES DRAWN AEROBIC AND ANAEROBIC Blood Culture adequate volume   Culture   Final  NO  GROWTH 4 DAYS Performed at [facility], [place] [address], [place], [place] [address]    Report Status PENDING  Incomplete     Labs: BNP (last 3 results)  Recent Labs  03/09/16 1731 04/26/16 1322 12/02/16 1839  BNP 871.0* 666.0* 387.5*   Basic Metabolic Panel:  Recent Labs Lab 12/20/16 1856 12/21/16 0557 12/22/16 0527 12/23/16 0527 12/24/16 0512 12/24/16 0515 12/25/16 0825  NA  --  132* 134* 134* 133*  --  136  K  --  3.8 3.2* 3.1* 2.9*  --  3.8  CL  --  95* 96* 99* 101  --  102  CO2  --  26 23 21* 24  --  25  GLUCOSE  --  363* 164* 227* 284*  --  128*  BUN  --  16 22* 23* 25*  --  21*  CREATININE  --  1.51* 2.26* 2.21* 1.78*  --  1.30*  CALCIUM  --  8.3* 8.8* 8.1* 7.8*  --  8.3*  MG 1.4* 2.1  --   --   --  1.7 1.9  PHOS 3.7  --   --   --   --   --  2.9   Liver Function Tests:  Recent Labs Lab 12/20/16 1320  AST 20  ALT 15  ALKPHOS 127*  BILITOT 0.9  PROT 6.4*  ALBUMIN 2.5*   CBC:  Recent Labs Lab 12/20/16 1320 12/20/16 1605 12/21/16 0557 12/22/16 0527  WBC 8.4  --  8.1 10.7*  NEUTROABS 6.7  --   --  8.3*  HGB 11.4* 11.2* 10.2* 11.9*  HCT 33.9* 33.0* 30.9* 35.8*  MCV 73.5*  --  73.7* 72.3*  PLT 300  --  275 309    CBG:  Recent Labs Lab 12/24/16 0738 12/24/16 1125 12/24/16 1700 12/24/16 2109 12/25/16 0839  GLUCAP 250* 289* 376* 265* 123*    Urinalysis    Component Value Date/Time   COLORURINE YELLOW 12/05/2015 1755   APPEARANCEUR CLEAR 12/05/2015 1755   LABSPEC 1.030 12/05/2015 1755   PHURINE 5.5 12/05/2015 1755   GLUCOSEU 100 (A) 12/05/2015 1755   HGBUR MODERATE (A) 12/05/2015 1755   BILIRUBINUR NEGATIVE 12/05/2015 1755   KETONESUR NEGATIVE 12/05/2015 1755   PROTEINUR 100 (A) 12/05/2015 1755   UROBILINOGEN 1.0 04/12/2013 2208   NITRITE NEGATIVE 12/05/2015 1755   LEUKOCYTESUR TRACE (A) 12/05/2015 1755   Sepsis Labs Invalid input(s): PROCALCITONIN,  WBC,  LACTICIDVEN Microbiology Recent Results (from the past 240  hour(s))  Wound or Superficial Culture     Status: None   Collection Time: 12/20/16  4:36 PM  Result Value Ref Range Status   Specimen Description ABSCESS  Final   Special Requests NONE  Final   Gram Stain   Final    MODERATE WBC PRESENT, PREDOMINANTLY PMN ABUNDANT GRAM POSITIVE COCCI IN PAIRS IN CHAINS IN CLUSTERS GRAM NEGATIVE RODS    Culture   Final    NORMAL SKIN FLORA Performed at [facility], [address], [place], [place] [address]    Report Status 12/22/2016 FINAL  Final  Blood culture (routine x 2)     Status: None (Preliminary result)   Collection Time: 12/20/16  4:43 PM  Result Value Ref Range Status   Specimen Description BLOOD RIGHT ANTECUBITAL  Final   Special Requests   Final    BOTTLES DRAWN AEROBIC AND ANAEROBIC Blood Culture adequate volume   Culture   Final    NO  GROWTH 4 DAYS Performed at [facility], [place] [address] [place], [place] [address]    Report Status PENDING  Incomplete  Blood culture (routine x 2)     Status: None (Preliminary result)   Collection Time: 12/20/16  4:58 PM  Result Value Ref Range Status   Specimen Description BLOOD RIGHT HAND  Final   Special Requests   Final    BOTTLES DRAWN AEROBIC AND ANAEROBIC Blood Culture adequate volume   Culture   Final    NO GROWTH 4 DAYS Performed at [facility], [place] [address] [place], [place] [address]    Report Status PENDING  Incomplete     Time coordinating discharge:  35 minutes  SIGNED:  Chipper Oman, MD  [facility] 12/25/2016, 11:56 AM  Pager please text page via  [email or web address] Password [email or web address]

## 2016-12-25 NOTE — Progress Notes (Signed)
Occupational Therapy Treatment Patient Details Name: Amber Young MRN: [id number] DOB: [birth year] Today's Date: 12/25/2016    History of present illness Amber Young is a 61 y.o. female with known HTN, GERD, CAD, DM, a-fin on coumadin, presented to [facility] ED after an episode of fall backwards and fell on her left arm. Proximal humeral diaphysis/neck fracture.   OT comments  Pt progressing towards goals. Session included review of LUE HEP elbow, wrist, and hand, completed UB/LB bathing and UB/LB dressing. Pt requires MinGuard assist for functional mobility and MaxA for UB ADLs secondary to adhering to shoulder precautions. Education provided throughout session on proper technique and compensatory strategies for completing UB ADLs while adhering to shoulder precautions with handout provided. Questions answered throughout. Pt reports feeling confident instructing family to assist with ADLs, sling management, and HEP PRN after discharge home (no family present during session). Pt will continue to benefit from OT services in order to maximize safety and independence with completing ADLs and functional mobility. Will continue to follow.    Follow Up Recommendations  SNF;Supervision/Assistance - 24 hour    Equipment Recommendations  3 in 1 bedside commode          Precautions / Restrictions Precautions Precautions: Shoulder Shoulder Interventions: Shoulder sling/immobilizer Precaution Comments: NWB to LUE, sling for comfort, ROM OK elbow wrist and hand per MD Required Braces or Orthoses: Sling Restrictions Weight Bearing Restrictions: Yes LUE Weight Bearing: Non weight bearing       Mobility Bed Mobility               General bed mobility comments: Pt OOB in recliner   Transfers Overall transfer level: Needs assistance Equipment used: None Transfers: Sit to/from Stand Sit to Stand: Min guard Stand pivot transfers: Supervision;Min guard       General transfer comment:  increased safety and ability to self rise using only R UE (L UE sling). Performed well. Pt demonstrates stand to sit transfer using RUE to self assist and control descent     Balance Overall balance assessment: History of Falls;Needs assistance Sitting-balance support: Feet supported;Single extremity supported Sitting balance-Leahy Scale: Good                                     ADL either performed or assessed with clinical judgement   ADL Overall ADL's : Needs assistance/impaired         Upper Body Bathing: Minimal assistance;Sitting;Cueing for safety Upper Body Bathing Details (indicate cue type and reason): for washing RUE, assist and verbal cues for technique to wash LUE Lower Body Bathing: Minimal assistance;Sit to/from stand   Upper Body Dressing : Moderate assistance;Sitting Upper Body Dressing Details (indicate cue type and reason): doffing gown, donning button up dress; assist for LUE  Lower Body Dressing: Maximal assistance;Sit to/from stand Lower Body Dressing Details (indicate cue type and reason): donning underwear            Tub/Shower Transfer Details (indicate cue type and reason): educated on sponge bathing/washing up while seated in order to increase safety during task completion    General ADL Comments: session included completing UB/LB bathing, UB/LB dressing, doffing and donning sling, LUE HEP; educated on compensatory techniques for completing ADL tasks while adhering to shoulder precautions; educated on positioning of shoulder for sleeping; handout provided  Cognition Arousal/Alertness: Awake/alert Behavior During Therapy: WFL for tasks assessed/performed Overall Cognitive Status: Within Functional Limits for tasks assessed                                 General Comments: wide awake and excited to go home today        Exercises Shoulder Exercises Elbow Flexion: Left;5  reps;Standing;AROM;Self ROM (Pt self assisting; slow, cautious movements with increased confidence during rep completion) Elbow Extension: AROM;Self ROM;Left;5 reps;Standing (Pt self assisting; slow, cautious movements with increased confidence during rep completion) Wrist Flexion: AROM;Left;5 reps;Seated Wrist Extension: AROM;Left;5 reps;Seated Digit Composite Flexion: AROM;Left;Seated;5 reps Composite Extension: AROM;5 reps;Left;Seated Neck Flexion: AROM;Seated Neck Extension: AROM;Seated Neck Lateral Flexion - Right: AROM;Seated Neck Lateral Flexion - Left: AROM;Seated   Shoulder Instructions Shoulder Instructions Donning/doffing shirt without moving shoulder: Maximal assistance Method for sponge bathing under operated UE: Moderate assistance Donning/doffing sling/immobilizer: Moderate assistance Correct positioning of sling/immobilizer: Moderate assistance;Patient able to independently direct caregiver ROM for elbow, wrist and digits of operated UE: Minimal assistance Sling wearing schedule (on at all times/off for ADL's): Modified independent Proper positioning of operated UE when showering: Supervision/safety Positioning of UE while sleeping: Minimal assistance     General Comments      Pertinent Vitals/ Pain       Pain Assessment: Faces Faces Pain Scale: Hurts little more Pain Location: L Humerus Pain Descriptors / Indicators: Grimacing Pain Intervention(s): Monitored during session;Limited activity within patient's tolerance                                            Prior Functioning/Environment              Frequency  Min 2X/week        Progress Toward Goals  OT Goals(current goals can now be found in the care plan section)  Progress towards OT goals: Progressing toward goals  Acute Rehab OT Goals Patient Stated Goal: get home with husband OT Goal Formulation: With patient Time For Goal Achievement: 01/04/17 Potential to Achieve  Goals: Good  Plan Discharge plan remains appropriate                     AM-PAC PT "6 Clicks" Daily Activity     Outcome Measure   Help from another person eating meals?: A Little Help from another person taking care of personal grooming?: A Little Help from another person toileting, which includes using toliet, bedpan, or urinal?: A Lot Help from another person bathing (including washing, rinsing, drying)?: A Lot Help from another person to put on and taking off regular upper body clothing?: A Lot Help from another person to put on and taking off regular lower body clothing?: A Lot 6 Click Score: 14    End of Session Equipment Utilized During Treatment: Other (comment) (L shoulder sling )  OT Visit Diagnosis: Muscle weakness (generalized) (M62.81);History of falling (Z91.81)   Activity Tolerance Patient tolerated treatment well   Patient Left in chair;with call bell/phone within reach   Nurse Communication Mobility status        Time: 1550-1640 OT Time Calculation (min): 50 min  Charges: OT General Charges $OT Visit: 1 Procedure OT Treatments $Self Care/Home Management : 23-37 mins $Therapeutic Exercise: 8-22 mins  Lou Cal, [place] Pager [phone] 12/25/2016  Raymondo Band 12/25/2016, 3:09 PM

## 2016-12-27 ENCOUNTER — Emergency Department (HOSPITAL_COMMUNITY)
Admission: EM | Admit: 2016-12-27 | Discharge: 2016-12-28 | Disposition: A | Discharge status: Home / Self Care | Payer: Self-pay | Attending: Emergency Medicine | Admitting: Emergency Medicine

## 2016-12-27 ENCOUNTER — Encounter (HOSPITAL_COMMUNITY): Payer: Self-pay

## 2016-12-27 ENCOUNTER — Emergency Department (HOSPITAL_COMMUNITY): Payer: Self-pay

## 2016-12-27 DIAGNOSIS — E119 Type 2 diabetes mellitus without complications: Secondary | ICD-10-CM | POA: Insufficient documentation

## 2016-12-27 DIAGNOSIS — Z8673 Personal history of transient ischemic attack (TIA), and cerebral infarction without residual deficits: Secondary | ICD-10-CM | POA: Insufficient documentation

## 2016-12-27 DIAGNOSIS — R0602 Shortness of breath: Secondary | ICD-10-CM | POA: Insufficient documentation

## 2016-12-27 DIAGNOSIS — R600 Localized edema: Secondary | ICD-10-CM | POA: Insufficient documentation

## 2016-12-27 DIAGNOSIS — I5041 Acute combined systolic (congestive) and diastolic (congestive) heart failure: Secondary | ICD-10-CM | POA: Insufficient documentation

## 2016-12-27 DIAGNOSIS — I110 Hypertensive heart disease with heart failure: Secondary | ICD-10-CM | POA: Insufficient documentation

## 2016-12-27 DIAGNOSIS — Z79899 Other long term (current) drug therapy: Secondary | ICD-10-CM | POA: Insufficient documentation

## 2016-12-27 DIAGNOSIS — I2510 Atherosclerotic heart disease of native coronary artery without angina pectoris: Secondary | ICD-10-CM | POA: Insufficient documentation

## 2016-12-27 DIAGNOSIS — Z794 Long term (current) use of insulin: Secondary | ICD-10-CM | POA: Insufficient documentation

## 2016-12-27 DIAGNOSIS — Z7901 Long term (current) use of anticoagulants: Secondary | ICD-10-CM | POA: Insufficient documentation

## 2016-12-27 DIAGNOSIS — Z955 Presence of coronary angioplasty implant and graft: Secondary | ICD-10-CM | POA: Insufficient documentation

## 2016-12-27 LAB — CBC
HCT: 32.8 % — ABNORMAL LOW (ref 36.0–46.0)
HEMOGLOBIN: 10.6 g/dL — AB (ref 12.0–15.0)
MCH: 24.2 pg — ABNORMAL LOW (ref 26.0–34.0)
MCHC: 32.3 g/dL (ref 30.0–36.0)
MCV: 74.9 fL — ABNORMAL LOW (ref 78.0–100.0)
Platelets: 404 10*3/uL — ABNORMAL HIGH (ref 150–400)
RBC: 4.38 MIL/uL (ref 3.87–5.11)
RDW: 16.3 % — ABNORMAL HIGH (ref 11.5–15.5)
WBC: 6 10*3/uL (ref 4.0–10.5)

## 2016-12-27 LAB — BASIC METABOLIC PANEL
ANION GAP: 8 (ref 5–15)
BUN: 21 mg/dL — ABNORMAL HIGH (ref 6–20)
CALCIUM: 8.4 mg/dL — AB (ref 8.9–10.3)
CO2: 23 mmol/L (ref 22–32)
Chloride: 102 mmol/L (ref 101–111)
Creatinine, Ser: 1.21 mg/dL — ABNORMAL HIGH (ref 0.44–1.00)
GFR calc Af Amer: 55 mL/min — ABNORMAL LOW (ref >60)
GFR, EST NON AFRICAN AMERICAN: 48 mL/min — AB (ref >60)
GLUCOSE: 210 mg/dL — AB (ref 65–99)
Potassium: 4.9 mmol/L (ref 3.5–5.1)
SODIUM: 133 mmol/L — AB (ref 135–145)

## 2016-12-27 LAB — I-STAT TROPONIN, ED: TROPONIN I, POC: 0.03 ng/mL (ref 0.00–0.08)

## 2016-12-27 LAB — BRAIN NATRIURETIC PEPTIDE: B NATRIURETIC PEPTIDE 5: 982.9 pg/mL — AB (ref 0.0–100.0)

## 2016-12-27 LAB — PROTIME-INR
INR: 1.21
Prothrombin Time: 15.4 seconds — ABNORMAL HIGH (ref 11.4–15.2)

## 2016-12-27 MED ORDER — FUROSEMIDE 10 MG/ML IJ SOLN
40.0000 mg | Freq: Once | INTRAMUSCULAR | Status: AC
  Administered 2016-12-27: 40 mg via INTRAVENOUS
  Filled 2016-12-27: qty 4

## 2016-12-27 NOTE — Discharge Instructions (Signed)
You can take a dose of lasix tomorrow.

## 2016-12-27 NOTE — ED Provider Notes (Signed)
[facility] DEPT Provider Note   CSN: [id number] Arrival date & time: 12/27/16  2036     History   Chief Complaint Chief Complaint  Patient presents with  . Leg Swelling    HPI Amber Young is a 61 y.o. female.  The history is provided by the patient. No language interpreter was used.   Amber Young is a 61 y.o. female who presents to the Emergency Department complaining of leg swelling, sob.  She reports progressive bilateral lower extremity edema with difficulty with ambulation over the last several days. She also has chronic shortness of breath and this is slightly worsened from her baseline. She was recently admitted to the hospital for broken arm. She also abscess on her chest I and D'd one week ago and has planned outpatient follow up for marsupialization. She has intermittent chest pain with chronic cough. No fevers, vomiting, diarrhea, abdominal pain.  Past Medical History:  Diagnosis Date  . Anemia   . CHF (congestive heart failure) ([place])   . Coronary artery disease   . GERD (gastroesophageal reflux disease)   . Heart murmur   . Hyperlipidemia   . Hypertension   . Stroke ([place]) 09/2014   numbness left upper lip, finger tips on left hand; "resolved" (05/18/2016)  . Type II diabetes mellitus [facility])     Patient Active Problem List   Diagnosis Date Noted  . Breast abscess 12/20/2016  . Encounter for therapeutic drug monitoring 05/29/2016  . CAD S/P PCI mLAD DES (Promus 3.0 x 24 -- 3.25 mm) 05/19/2016  . New onset atrial fibrillation ([place]) 05/19/2016  . Cardiomyopathy, ischemic     Class: Diagnosis of  . Anemia 04/26/2016  . CHF, acute on chronic ([place]) 04/26/2016  . CHF (congestive heart failure) ([place]) 04/26/2016  . Acute combined systolic and diastolic CHF, NYHA class 1 ([place]) 11/14/2015  . Hypokalemia 11/14/2015  . Acute respiratory failure with hypoxia ([place]) 11/14/2015  . Hypertensive emergency   . Stroke ([place]) 09/30/2014  . Acute CVA (cerebrovascular  accident) ([place]) 09/30/2014  . Uncontrolled hypertension 09/30/2014  . Uncontrolled diabetes mellitus ([place]) 09/30/2014  . CVA (cerebral infarction) 09/30/2014    Past Surgical History:  Procedure Laterality Date  . CARDIAC CATHETERIZATION N/A 05/18/2016   Procedure: Left Heart Cath and Coronary Angiography;  Surgeon: Jettie Booze, MD;  Location: [facility] CV LAB;  Service: Cardiovascular;  Laterality: N/A;  . CARDIAC CATHETERIZATION N/A 05/18/2016   Procedure: Coronary Stent Intervention;  Surgeon: Jettie Booze, MD;  Location: [place] CV LAB;  Service: Cardiovascular;  Laterality: N/A;  . CORONARY ANGIOPLASTY    . TUBAL LIGATION  1980  . VAGINAL HYSTERECTOMY  1999   "partial; fibroids"    OB History    Gravida Para Term Preterm AB Living   2         2   SAB TAB Ectopic Multiple Live Births                   Home Medications    Prior to Admission medications   Medication Sig Start Date End Date Taking? Authorizing Provider  carvedilol (COREG) 25 MG tablet Take 1 tablet (25 mg total) by mouth 2 (two) times daily. 12/25/16  Yes Patrecia Pour, Christean Grief, MD  Glucose Blood (BLOOD GLUCOSE TEST STRIPS) STRP Use as directed 11/17/15  Yes Memon, Jolaine Artist, MD  insulin detemir (LEVEMIR) 100 UNIT/ML injection Inject 0.13 mLs (13 Units total) into the skin 2 (two) times daily. 12/25/16  Yes Quincy Simmonds  Ocie Bob, MD  Insulin Pen Needle 29G X 10MM MISC Use as directed 11/17/15  Yes Kathie Dike, MD  potassium chloride SA (K-DUR,KLOR-CON) 20 MEQ tablet Take 2 tablets (40 mEq total) by mouth 2 (two) times daily. 12/25/16  Yes Patrecia Pour, Christean Grief, MD  ticagrelor (BRILINTA) 90 MG TABS tablet Take 1 tablet (90 mg total) by mouth 2 (two) times daily. 05/20/16  Yes Cheryln Manly, NP  traMADol (ULTRAM) 50 MG tablet Take 1 tablet (50 mg total) by mouth every 6 (six) hours as needed for severe pain. 12/25/16  Yes Patrecia Pour, Christean Grief, MD  warfarin (COUMADIN) 5 MG tablet Take 1 tablet (5 mg  total) by mouth daily at 6 PM. Patient taking differently: Take 5-7.5 mg by mouth daily at 6 PM. Take 5 mg daily except 7.5 mg on Monday, Wednesday and Friday 05/20/16   Cheryln Manly, NP    Family History Family History  Problem Relation Age of Onset  . Diabetes Mother   . Hypertension Mother   . COPD Mother   . Heart failure Mother   . Hypertension Sister   . Hypertension Brother     Social History Social History  Substance Use Topics  . Smoking status: Never Smoker  . Smokeless tobacco: Never Used  . Alcohol use No     Allergies   Ace inhibitors   Review of Systems Review of Systems  All other systems reviewed and are negative.    Physical Exam Updated Vital Signs BP (!) 153/86   Pulse 66   Temp 97.9 F (36.6 C) (Oral)   Resp 19   Wt 179 lb (81.2 kg)   SpO2 99%   BMI 28.89 kg/m   Physical Exam  Constitutional: She is oriented to person, place, and time. She appears well-developed and well-nourished.  HENT:  Head: Normocephalic and atraumatic.  Cardiovascular: Normal rate and regular rhythm.   No murmur heard. Pulmonary/Chest: Effort normal and breath sounds normal. No respiratory distress.  Xiphoid process with overlying 3 cm nodular mass with central drainage. There is no surrounding erythema or edema  Abdominal: Soft. There is no tenderness. There is no rebound and no guarding.  Musculoskeletal:  2-3+ pitting edema to bilateral lower extremities. Left upper extremity is in a sling.  Neurological: She is alert and oriented to person, place, and time.  Skin: Skin is warm and dry.  Psychiatric: She has a normal mood and affect. Her behavior is normal.  Nursing note and vitals reviewed.    ED Treatments / Results  Labs (all labs ordered are listed, but only abnormal results are displayed) Labs Reviewed  BASIC METABOLIC PANEL - Abnormal; Notable for the following:       Result Value   Sodium 133 (*)    Glucose, Bld 210 (*)    BUN 21 (*)     Creatinine, Ser 1.21 (*)    Calcium 8.4 (*)    GFR calc non Af Amer 48 (*)    GFR calc Af Amer 55 (*)    All other components within normal limits  CBC - Abnormal; Notable for the following:    Hemoglobin 10.6 (*)    HCT 32.8 (*)    MCV 74.9 (*)    MCH 24.2 (*)    RDW 16.3 (*)    Platelets 404 (*)    All other components within normal limits  PROTIME-INR - Abnormal; Notable for the following:    Prothrombin Time 15.4 (*)  All other components within normal limits  BRAIN NATRIURETIC PEPTIDE - Abnormal; Notable for the following:    B Natriuretic Peptide 982.9 (*)    All other components within normal limits  I-STAT TROPOININ, ED    EKG  EKG Interpretation  Date/Time:  Sunday Dec 27 2016 21:09:21 EDT Ventricular Rate:  74 PR Interval:    QRS Duration: 96 QT Interval:  394 QTC Calculation: 438 R Axis:   51 Text Interpretation:  Sinus rhythm Probable left atrial enlargement Low voltage, precordial leads Nonspecific T abnrm, anterolateral leads baseline wander V3, V4 Confirmed by Hazle Coca [phone]) on 12/27/2016 10:27:54 PM       Radiology Dg Chest 2 View  Result Date: 12/27/2016 CLINICAL DATA:  Bilateral leg swelling for 1 week, chest pain. History of atrial fibrillation and hypertension. EXAM: CHEST  2 VIEW COMPARISON:  Chest radiograph December 03, 2011 FINDINGS: Stable cardiomegaly, mild calcific atherosclerosis of the aortic arch. Mediastinal silhouette is nonsuspicious. No pleural effusion. Bibasilar strandy densities. No pneumothorax. Re- demonstration of acute displaced LEFT proximal humerus fracture better characterized on radiograph Dec 20, 2016. IMPRESSION: Stable cardiomegaly, bibasilar atelectasis. Electronically Signed   By: Elon Alas M.D.   On: 12/27/2016 21:37    Procedures Procedures (including critical care time)  Medications Ordered in ED Medications  furosemide (LASIX) injection 40 mg (40 mg Intravenous Given 12/27/16 2340)     Initial Impression  / Assessment and Plan / ED Course  I have reviewed the triage vital signs and the nursing notes.  Pertinent labs & imaging results that were available during my care of the patient were reviewed by me and considered in my medical decision making (see chart for details).     Patient here for progressive leg edema and chronic shortness of breath. She was recently in the hospital and had her Lasix discontinued due to kidney injury. Her renal function has improved compared to priors with mild hyponatremia. BNP is slightly compared to priors but she has no evidence of volume overload on physical exam or chest x-ray. She has no hypoxia in the emergency department. We'll provide one-time dose of Lasix in the department and a repeat dose at home tomorrow. Counseled patient on close cardiology and PCP follow-up. Current clinical picture is not consistent with severe CHF exacerbation, ACS, PE.  Final Clinical Impressions(s) / ED Diagnoses   Final diagnoses:  Leg edema    New Prescriptions New Prescriptions   No medications on file     Quintella Reichert, MD 12/28/16 0010

## 2016-12-27 NOTE — ED Notes (Signed)
BLOOD DRAW DELAYED, EDP AT BEDSIDE.

## 2016-12-27 NOTE — ED Triage Notes (Signed)
States legs swelling for about a week and pain all over body states she has a hx of CHF and unable to lie down due to increasing shortness of breath alert and oriented x 3 able to speak in full sentences no respiratory or acute distress noted.

## 2016-12-28 NOTE — ED Notes (Signed)
Pt has a mid sternal abscess.  Copious amount of brown malodorous drainage noted.  Area cultured and cleansed. Dressing changed.

## 2016-12-29 ENCOUNTER — Ambulatory Visit: Payer: Self-pay | Admitting: Family Medicine

## 2017-01-05 ENCOUNTER — Ambulatory Visit (INDEPENDENT_AMBULATORY_CARE_PROVIDER_SITE_OTHER): Payer: Self-pay | Admitting: Family Medicine

## 2017-01-05 ENCOUNTER — Telehealth: Payer: Self-pay | Admitting: Family Medicine

## 2017-01-05 ENCOUNTER — Encounter: Payer: Self-pay | Admitting: Family Medicine

## 2017-01-05 VITALS — BP 144/72 | HR 66 | Temp 97.9°F | Resp 16 | Ht 66.0 in | Wt 190.0 lb

## 2017-01-05 DIAGNOSIS — S42302D Unspecified fracture of shaft of humerus, left arm, subsequent encounter for fracture with routine healing: Secondary | ICD-10-CM

## 2017-01-05 DIAGNOSIS — I639 Cerebral infarction, unspecified: Secondary | ICD-10-CM

## 2017-01-05 DIAGNOSIS — I5040 Unspecified combined systolic (congestive) and diastolic (congestive) heart failure: Secondary | ICD-10-CM

## 2017-01-05 DIAGNOSIS — E878 Other disorders of electrolyte and fluid balance, not elsewhere classified: Secondary | ICD-10-CM

## 2017-01-05 DIAGNOSIS — IMO0002 Reserved for concepts with insufficient information to code with codable children: Secondary | ICD-10-CM

## 2017-01-05 DIAGNOSIS — Z5181 Encounter for therapeutic drug level monitoring: Secondary | ICD-10-CM

## 2017-01-05 DIAGNOSIS — N289 Disorder of kidney and ureter, unspecified: Secondary | ICD-10-CM

## 2017-01-05 DIAGNOSIS — I4891 Unspecified atrial fibrillation: Secondary | ICD-10-CM

## 2017-01-05 DIAGNOSIS — Z9861 Coronary angioplasty status: Secondary | ICD-10-CM

## 2017-01-05 DIAGNOSIS — Z7901 Long term (current) use of anticoagulants: Secondary | ICD-10-CM

## 2017-01-05 DIAGNOSIS — E1165 Type 2 diabetes mellitus with hyperglycemia: Secondary | ICD-10-CM

## 2017-01-05 DIAGNOSIS — Z23 Encounter for immunization: Secondary | ICD-10-CM

## 2017-01-05 DIAGNOSIS — I10 Essential (primary) hypertension: Secondary | ICD-10-CM

## 2017-01-05 DIAGNOSIS — E1151 Type 2 diabetes mellitus with diabetic peripheral angiopathy without gangrene: Secondary | ICD-10-CM

## 2017-01-05 DIAGNOSIS — I2510 Atherosclerotic heart disease of native coronary artery without angina pectoris: Secondary | ICD-10-CM

## 2017-01-05 DIAGNOSIS — N611 Abscess of the breast and nipple: Secondary | ICD-10-CM

## 2017-01-05 DIAGNOSIS — R600 Localized edema: Secondary | ICD-10-CM

## 2017-01-05 DIAGNOSIS — Z794 Long term (current) use of insulin: Secondary | ICD-10-CM

## 2017-01-05 LAB — CBC WITH DIFFERENTIAL/PLATELET
BASOS ABS: 53 {cells}/uL (ref 0–200)
Basophils Relative: 1 %
EOS PCT: 1 %
Eosinophils Absolute: 53 cells/uL (ref 15–500)
HCT: 31.2 % — ABNORMAL LOW (ref 35.0–45.0)
HEMOGLOBIN: 9.8 g/dL — AB (ref 11.7–15.5)
LYMPHS ABS: 1325 {cells}/uL (ref 850–3900)
Lymphocytes Relative: 25 %
MCH: 24.5 pg — AB (ref 27.0–33.0)
MCHC: 31.4 g/dL — ABNORMAL LOW (ref 32.0–36.0)
MCV: 78 fL — ABNORMAL LOW (ref 80.0–100.0)
MONOS PCT: 10 %
MPV: 8.6 fL (ref 7.5–12.5)
Monocytes Absolute: 530 cells/uL (ref 200–950)
NEUTROS ABS: 3339 {cells}/uL (ref 1500–7800)
Neutrophils Relative %: 63 %
PLATELETS: 323 10*3/uL (ref 140–400)
RBC: 4 MIL/uL (ref 3.80–5.10)
RDW: 17.2 % — ABNORMAL HIGH (ref 11.0–15.0)
WBC: 5.3 10*3/uL (ref 3.8–10.8)

## 2017-01-05 LAB — GLUCOSE, CAPILLARY: Glucose-Capillary: 247 mg/dL — ABNORMAL HIGH (ref 65–99)

## 2017-01-05 LAB — COMPLETE METABOLIC PANEL WITH GFR
ALBUMIN: 2.7 g/dL — AB (ref 3.6–5.1)
ALK PHOS: 226 U/L — AB (ref 33–130)
ALT: 26 U/L (ref 6–29)
AST: 25 U/L (ref 10–35)
BILIRUBIN TOTAL: 0.5 mg/dL (ref 0.2–1.2)
BUN: 15 mg/dL (ref 7–25)
CO2: 19 mmol/L — AB (ref 20–31)
Calcium: 8.3 mg/dL — ABNORMAL LOW (ref 8.6–10.4)
Chloride: 107 mmol/L (ref 98–110)
Creat: 0.94 mg/dL (ref 0.50–0.99)
GFR, EST AFRICAN AMERICAN: 76 mL/min (ref >=60)
GFR, EST NON AFRICAN AMERICAN: 66 mL/min (ref >=60)
GLUCOSE: 232 mg/dL — AB (ref 65–99)
POTASSIUM: 4.8 mmol/L (ref 3.5–5.3)
SODIUM: 137 mmol/L (ref 135–146)
Total Protein: 6.1 g/dL (ref 6.1–8.1)

## 2017-01-05 LAB — LIPID PANEL
CHOL/HDL RATIO: 2.4 ratio (ref <5.0)
Cholesterol: 190 mg/dL (ref <200)
HDL: 80 mg/dL (ref >50)
LDL CALC: 95 mg/dL (ref <100)
Triglycerides: 74 mg/dL (ref <150)
VLDL: 15 mg/dL (ref <30)

## 2017-01-05 MED ORDER — INSULIN GLARGINE 100 UNIT/ML ~~LOC~~ SOLN: 20.0000 [IU] | Freq: Every day | SUBCUTANEOUS | 11 refills | Status: DC

## 2017-01-05 MED ORDER — ATORVASTATIN CALCIUM 40 MG PO TABS: 40.0000 mg | ORAL_TABLET | Freq: Every day | ORAL | 1 refills | Status: DC

## 2017-01-05 MED ORDER — FUROSEMIDE 80 MG PO TABS: 80.0000 mg | ORAL_TABLET | Freq: Every day | ORAL | 0 refills | Status: DC

## 2017-01-05 MED ORDER — INSULIN LISPRO 100 UNIT/ML ~~LOC~~ SOLN: 5.0000 [IU] | Freq: Three times a day (TID) | SUBCUTANEOUS | 11 refills | Status: DC

## 2017-01-05 MED ORDER — "INSULIN SYRINGE 29G X 1/2"" 0.5 ML MISC": 5 refills | Status: DC

## 2017-01-05 MED ORDER — WARFARIN SODIUM 5 MG PO TABS: 5.0000 mg | ORAL_TABLET | Freq: Every day | ORAL | 3 refills | Status: DC

## 2017-01-05 MED ORDER — POTASSIUM CHLORIDE CRYS ER 20 MEQ PO TBCR: 40.0000 meq | EXTENDED_RELEASE_TABLET | Freq: Two times a day (BID) | ORAL | 0 refills | Status: DC

## 2017-01-05 MED ORDER — INSULIN GLARGINE 100 UNIT/ML ~~LOC~~ SOLN: 30.0000 [IU] | Freq: Every day | SUBCUTANEOUS | 11 refills | Status: DC

## 2017-01-05 MED ORDER — CLONIDINE HCL 0.1 MG PO TABS: 0.1000 mg | ORAL_TABLET | Freq: Two times a day (BID) | ORAL | 3 refills | Status: DC

## 2017-01-05 MED ORDER — CEPHALEXIN 750 MG PO CAPS: 750.0000 mg | ORAL_CAPSULE | Freq: Three times a day (TID) | ORAL | 0 refills | Status: AC

## 2017-01-05 MED FILL — TRUEPLUS SYR 0.5ML 30GX5/16: 30G X 5/16" | 30 days supply | Qty: 100 | Fill #0

## 2017-01-05 MED FILL — ?CEPHALEXIN 250 MG CAPSULE: 250 | 14 days supply | Qty: 126 | Fill #0

## 2017-01-05 MED FILL — ?ATORVASTATIN 40MG TABLET: 40 | 30 days supply | Qty: 30 | Fill #0

## 2017-01-05 MED FILL — cloNIDine HCL 0.1 MG TABS: 0.1 | 30 days supply | Qty: 60 | Fill #0

## 2017-01-05 MED FILL — POTASSIUM CL ER 20 MEQ TAB: 20 | 30 days supply | Qty: 120 | Fill #0

## 2017-01-05 MED FILL — ?FUROSEMIDE 80MG TABLET: 80 | 10 days supply | Qty: 10 | Fill #0

## 2017-01-05 MED FILL — !LANTUS SOLOSTAR 100UNITS/M: 100 | 30 days supply | Qty: 6 | Fill #0

## 2017-01-05 MED FILL — !HUMALOG 100 UNITS/ML VIAL: 100 | 28 days supply | Qty: 10 | Fill #0

## 2017-01-05 MED FILL — WARFARIN SODIUM 5 MG TABLET: 5 | 30 days supply | Qty: 30 | Fill #0

## 2017-01-05 NOTE — Patient Instructions (Addendum)
Call Dr.  Corene Cornea Patrick's office at [phone] to schedule a repeat x-ray for your left arm fracture. This appointment has not been scheduled.  Your referrals are pending for Cardiology and [facility]  Drink at least 6 glasses of plain water while taking furosemide to prevent dehydration of kidneys.  -You should weigh your self daily and if you suffer greater than 2.5 lb weight gain overnight or greater than 5 lbs in one week, notify me here at the office and or go the Emergency Room.  Hypertension: Continue Coreg and Losartan as prescribed. I am adding Clonidine 0.1 mg twice daily for blood pressure control. Medication rapidly reduces blood pressure, change positions slowly. -Return to office on 6/4/018 for blood pressure check.  Fluid Retention in Lower Legs I am starting you back on a high dose of Lasix 80 mg once daily for 10 day. This will cause you to urinate excessively. Drink up to 6 glasses of water to hydrate kidneys. Take Potassium-KDur- 1 tablet 20 MEQ  twice daily. On day 11, resume daily Lasix 40 mg and take Potassium-KDur- 20 MEQ, 1 tablet once daily.   Diabetes: Anticoagulation for Atrial Fibrillation , Cardiovascular Stents, and Stroke prevention   -Resume Coumadin and Continue Brilinta as prescribed. -Return to office on 6/4/018 for repeat PT/INR  Abscess Take Keflex 750 mg TID, until your follow-up with Dr. Armandina Gemma.  Blood Glucose Monitoring, Adult Monitoring your blood sugar (glucose) helps you manage your diabetes. It also helps you and your health care provider determine how well your diabetes management plan is working. Blood glucose monitoring involves checking your blood glucose as often as directed, and keeping a record (log) of your results over time. Why should I monitor my blood glucose? Checking your blood glucose regularly can:  Help you understand how food, exercise, illnesses, and medicines affect your blood glucose.  Let you know what your  blood glucose is at any time. You can quickly tell if you are having low blood glucose (hypoglycemia) or high blood glucose (hyperglycemia).  Help you and your health care provider adjust your medicines as needed. When should I check my blood glucose? Follow instructions from your health care provider about how often to check your blood glucose. This may depend on:  The type of diabetes you have.  How well-controlled your diabetes is.  Medicines you are taking. If you have type 1 diabetes:   Check your blood glucose at least 2 times a day.  Also check your blood glucose:  Before every insulin injection.  Before and after exercise.  Between meals.  2 hours after a meal.  Occasionally between 2:00 a.m. and 3:00 a.m., as directed.  Before potentially dangerous tasks, like driving or using heavy machinery.  At bedtime.  You may need to check your blood glucose more often, up to 6-10 times a day:  If you use an insulin pump.  If you need multiple daily injections (MDI).  If your diabetes is not well-controlled.  If you are ill.  If you have a history of severe hypoglycemia.  If you have a history of not knowing when your blood glucose is getting low (hypoglycemia unawareness). If you have type 2 diabetes:   If you take insulin or other diabetes medicines, check your blood glucose at least 2 times a day.  If you are on intensive insulin therapy, check your blood glucose at least 4 times a day. Occasionally, you may also need to check between 2:00 a.m. and 3:00  a.m., as directed.  Also check your blood glucose:  Before and after exercise.  Before potentially dangerous tasks, like driving or using heavy machinery.  You may need to check your blood glucose more often if:  Your medicine is being adjusted.  Your diabetes is not well-controlled.  You are ill. What is a blood glucose log?  A blood glucose log is a record of your blood glucose readings. It helps you  and your health care provider:  Look for patterns in your blood glucose over time.  Adjust your diabetes management plan as needed.  Every time you check your blood glucose, write down your result and notes about things that may be affecting your blood glucose, such as your diet and exercise for the day.  Most glucose meters store a record of glucose readings in the meter. Some meters allow you to download your records to a computer. How do I check my blood glucose? Follow these steps to get accurate readings of your blood glucose: Supplies needed    Blood glucose meter.  Test strips for your meter. Each meter has its own strips. You must use the strips that come with your meter.  A needle to prick your finger (lancet). Do not use lancets more than once.  A device that holds the lancet (lancing device).  A journal or log book to write down your results. Procedure   Wash your hands with soap and water.  Prick the side of your finger (not the tip) with the lancet. Use a different finger each time.  Gently rub the finger until a small drop of blood appears.  Follow instructions that come with your meter for inserting the test strip, applying blood to the strip, and using your blood glucose meter.  Write down your result and any notes. Alternative testing sites   Some meters allow you to use areas of your body other than your finger (alternative sites) to test your blood.  If you think you may have hypoglycemia, or if you have hypoglycemia unawareness, do not use alternative sites. Use your finger instead.  Alternative sites may not be as accurate as the fingers, because blood flow is slower in these areas. This means that the result you get may be delayed, and it may be different from the result that you would get from your finger.  The most common alternative sites are:  Forearm.  Thigh.  Palm of the hand. Additional tips   Always keep your supplies with you.  If  you have questions or need help, all blood glucose meters have a 24-hour "hotline" number that you can call. You may also contact your health care provider.  After you use a few boxes of test strips, adjust (calibrate) your blood glucose meter by following instructions that came with your meter. This information is not intended to replace advice given to you by your health care provider. Make sure you discuss any questions you have with your health care provider. Document Released: 07/30/2003 Document Revised: 02/14/2016 Document Reviewed: 01/06/2016 Elsevier Interactive Patient Education  2017 [employer].    Heart Failure Heart failure means your heart has trouble pumping blood. This makes it hard for your body to work well. Heart failure is usually a long-term (chronic) condition. You must take good care of yourself and follow your doctor's treatment plan. Follow these instructions at home:  Take your heart medicine as told by your doctor.  Do not stop taking medicine unless your doctor tells you  to.  Do not skip any dose of medicine.  Refill your medicines before they run out.  Take other medicines only as told by your doctor or pharmacist.  Stay active if told by your doctor. The elderly and people with severe heart failure should talk with a doctor about physical activity.  Eat heart-healthy foods. Choose foods that are without trans fat and are low in saturated fat, cholesterol, and salt (sodium). This includes fresh or frozen fruits and vegetables, fish, lean meats, fat-free or low-fat dairy foods, whole grains, and high-fiber foods. Lentils and dried peas and beans (legumes) are also good choices.  Limit salt if told by your doctor.  Cook in a healthy way. Roast, grill, broil, bake, poach, steam, or stir-fry foods.  Limit fluids as told by your doctor.  Weigh yourself every morning. Do this after you pee (urinate) and before you eat breakfast. Write down your weight to  give to your doctor.  Take your blood pressure and write it down if your doctor tells you to.  Ask your doctor how to check your pulse. Check your pulse as told.  Lose weight if told by your doctor.  Stop smoking or chewing tobacco. Do not use gum or patches that help you quit without your doctor's approval.  Schedule and go to doctor visits as told.  Nonpregnant women should have no more than 1 drink a day. Men should have no more than 2 drinks a day. Talk to your doctor about drinking alcohol.  Stop illegal drug use.  Stay current with shots (immunizations).  Manage your health conditions as told by your doctor.  Learn to manage your stress.  Rest when you are tired.  If it is really hot outside:  Avoid intense activities.  Use air conditioning or fans, or get in a cooler place.  Avoid caffeine and alcohol.  Wear loose-fitting, lightweight, and light-colored clothing.  If it is really cold outside:  Avoid intense activities.  Layer your clothing.  Wear mittens or gloves, a hat, and a scarf when going outside.  Avoid alcohol.  Learn about heart failure and get support as needed.  Get help to maintain or improve your quality of life and your ability to care for yourself as needed. Contact a doctor if:  You gain weight quickly.  You are more short of breath than usual.  You cannot do your normal activities.  You tire easily.  You cough more than normal, especially with activity.  You have any or more puffiness (swelling) in areas such as your hands, feet, ankles, or belly (abdomen).  You cannot sleep because it is hard to breathe.  You feel like your heart is beating fast (palpitations).  You get dizzy or light-headed when you stand up. Get help right away if:  You have trouble breathing.  There is a change in mental status, such as becoming less alert or not being able to focus.  You have chest pain or discomfort.  You faint. This information  is not intended to replace advice given to you by your health care provider. Make sure you discuss any questions you have with your health care provider. Document Released: 05/05/2008 Document Revised: 01/02/2016 Document Reviewed: 09/12/2012 Elsevier Interactive Patient Education  2017 [employer].

## 2017-01-05 NOTE — Telephone Encounter (Signed)
Please call [facility] to have patient scheduled for emergent cardiology visit due to symptomatic CHF and Atrial Fibrillation, Dual Anticoagulation therapy, cardiac stent placement, history of CVA. Last cardiology visit 05/2016. Recently moved to [place] from [place]. Formerly under the care of Dr. Bronson Ing, Lenise Herald, MD

## 2017-01-05 NOTE — Progress Notes (Signed)
Patient ID: Amber Young, female    DOB: 1955-11-23, 61 y.o.   MRN: [id number]  PCP: Scot Jun, FNP  Chief Complaint  Patient presents with  . Establish Care  . Hospitalization Follow-up    Subjective:  HPI Amber Young is a 61 y.o. female presents to establish care and hospital follow-up.  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  Home Caretakers is Spouse and Daughter   General History /Hospital Follow-up  Patient is uninsured and had previously receive primary health care at [facility]. She no longer lives in [place] and now resides in [place], [place]. She has been unemployed since December 2017 due to worsening health condition and mobility. She has been followed by Dr. Kate Sable, M.D., for cardiology service with her last visit occurring on 09/08/2016. Cardiac medications consist of Losartan , Carvedilol , Furosemide, Brilinta, and Warfarin. She is on dual therapy for aggressive anticoagulation r/t high CHA2DS2-VASc score of 7. She had attended the  anticoagulation clinic adherently up until 4/252018 ED visit and subsequent hospitalization 12/20/16, and ED visit 12/27/16  1. 12/02/2016, presented to [facility] for Chest pain and sensation of "heart fluttering" with associated shortness of breath. She has significant lower extremity edema. She was found to be hypokalemic and significantly hypertensive. She was covered with home medications and discharged.  2. 12/20/2016, Admitted for a closed Fracture Left Proximal humeral fracture, deemed not to be a candidate surgical intervention. She was advised to immobilize left arm with sling. Take tylenol for pain. She was to schedule a follow-up x-ray with Dr. Stann Mainland  in orthopedic surgery for 2 weeks after discharge for repeat imaging. Pt advised that she was instructed to stop her coumadin and has taken in 3 weeks. Per discharge instruction, Losartan and Lasix were to be heal due to AKI. She has continues Losartan and discontinued Lasix and Coumadin. Patient was evaluated for a sebaceous cyst with cellulitis. She has a follow-up scheduled with Dr. Tera Helper, general surgery to excise abscess. This visit is scheduled 01/15/2017. Recommendations were for SNF, due to uninsured, opted for home with home health.  3. 12/27/16 presented to [facility] ED with bilateral leg pain related to tightness from swelling.  symptomatic for heart failure exacerbation. She was diuresed with IV furosemide and discharge with instructions to resume Lasix the following day. She confirms that she has not taken any home PO lasix since 12/20/2016 admission.  Uncontrolled Hypertension  Reports an intolerance to ACE Inhibitors in the past causing dizziness and confusion. No home monitoring of blood pressure. Reports being aware that her blood pressure jumps into the 200's. Prior Doppler studies noted  significant LCA stenosis (12/16/2015 study).  She has not had any recent follow-up with neurology since 2016 CVA or had any recent doppler studies.   Uncontrolled Diabetes  A1c 15.4 as of 12/20/2016. She has been managed on Levemir with varying dosages by prior primary providers. Checks blood sugar at home occasionally. Reports good readings 135-155 fasting. Readings at there worst exceed  200 and exceed 400. Denies any associated visual disturbances, urinary frequency, pr neuropathy.   Social History   Social History  . Marital status: Married    Spouse name: N/A  . Number of children: N/A  . Years of education: 52   Occupational History  . hairdresser  Social History Main Topics  . Smoking status: Never Smoker  . Smokeless tobacco: Never Used  . Alcohol use No  . Drug use: No    . Sexual activity: Yes   Other Topics Concern  . Not on file   Social History Narrative   Married, 2 children, hairdresser   Caffeine use- occasional tea or coffee   Right handed       Family History  Problem Relation Age of Onset  . Diabetes Mother   . Hypertension Mother   . COPD Mother   . Heart failure Mother   . Hypertension Sister   . Hypertension Brother    Review of Systems See HPI Patient Active Problem List   Diagnosis Date Noted  . Breast abscess 12/20/2016  . Encounter for therapeutic drug monitoring 05/29/2016  . CAD S/P PCI mLAD DES (Promus 3.0 x 24 -- 3.25 mm) 05/19/2016  . New onset atrial fibrillation ([place]) 05/19/2016  . Cardiomyopathy, ischemic     Class: Diagnosis of  . Anemia 04/26/2016  . CHF, acute on chronic ([place]) 04/26/2016  . CHF (congestive heart failure) ([place]) 04/26/2016  . Acute combined systolic and diastolic CHF, NYHA class 1 ([place]) 11/14/2015  . Hypokalemia 11/14/2015  . Acute respiratory failure with hypoxia ([place]) 11/14/2015  . Hypertensive emergency   . Stroke ([place]) 09/30/2014  . Acute CVA (cerebrovascular accident) ([place]) 09/30/2014  . Uncontrolled hypertension 09/30/2014  . Uncontrolled diabetes mellitus ([place]) 09/30/2014  . CVA (cerebral infarction) 09/30/2014    Allergies  Allergen Reactions  . Ace Inhibitors Cough    Prior to Admission medications   Medication Sig Start Date End Date Taking? Authorizing Provider  amoxicillin-clavulanate (AUGMENTIN) 875-125 MG tablet Take 1 tablet by mouth 2 (two) times daily.   Yes [provider]  atorvastatin (LIPITOR) 40 MG tablet Take 1 tablet (40 mg total) by mouth daily at 6 PM. 01/05/17  Yes Scot Jun, FNP  carvedilol (COREG) 25 MG tablet Take 1 tablet (25 mg total) by mouth 2 (two) times daily. 12/25/16  Yes Patrecia Pour, Christean Grief, MD  Glucose Blood (BLOOD GLUCOSE TEST STRIPS) STRP Use as directed 11/17/15  Yes Kathie Dike, MD  Insulin Pen Needle 29G X 10MM MISC Use as  directed 11/17/15  Yes Kathie Dike, MD  losartan (COZAAR) 50 MG tablet Take 50 mg by mouth 2 (two) times daily.   Yes [provider]  potassium chloride SA (K-DUR,KLOR-CON) 20 MEQ tablet Take 2 tablets (40 mEq total) by mouth 2 (two) times daily. 01/05/17  Yes Scot Jun, FNP  ticagrelor (BRILINTA) 90 MG TABS tablet Take 1 tablet (90 mg total) by mouth 2 (two) times daily. 05/20/16  Yes Cheryln Manly, NP  traMADol (ULTRAM) 50 MG tablet Take 1 tablet (50 mg total) by mouth every 6 (six) hours as needed for severe pain. 12/25/16  Yes Patrecia Pour, Christean Grief, MD  cephALEXin (KEFLEX) 750 MG capsule Take 1 capsule (750 mg total) by mouth 3 (three) times daily. 01/05/17 01/19/17  Scot Jun, FNP  cloNIDine (CATAPRES) 0.1 MG tablet Take 1 tablet (0.1 mg total) by mouth 2 (two) times daily. 01/05/17   Scot Jun, FNP  furosemide (LASIX) 80 MG tablet Take 1 tablet (80 mg total) by mouth daily. 01/05/17 01/15/17  Scot Jun, FNP  insulin glargine (LANTUS) 100 UNIT/ML injection Inject 0.3 mLs (30 Units total) into the skin at bedtime. 01/05/17   Scot Jun, FNP  insulin lispro (HUMALOG) 100 UNIT/ML injection  Inject 0.05 mLs (5 Units total) into the skin 3 (three) times daily with meals. Administer for any blood sugar greater than 125 with meals 01/05/17   Scot Jun, FNP  INSULIN SYRINGE .5CC/29G 29G X 1/2" 0.5 ML MISC Use to administer insulin three times daily 01/05/17   Scot Jun, FNP  warfarin (COUMADIN) 5 MG tablet Take 1 tablet (5 mg total) by mouth daily at 6 PM. 01/05/17   Scot Jun, FNP    Past Medical, Surgical Family and Social History reviewed and updated.    Objective:   Today's Vitals   01/05/17 1013 01/05/17 1028  BP: (!) 160/70 (!) 144/72  Pulse: 66   Resp: 16   Temp: 97.9 F (36.6 C)   TempSrc: Oral   SpO2: 96%   Weight: 190 lb (86.2 kg)   Height: 5\' 6"  (1.676 m)     Wt Readings from Last 3 Encounters:  01/05/17  190 lb (86.2 kg)  12/27/16 179 lb (81.2 kg)  12/25/16 180 lb 8.9 oz (81.9 kg)    Physical Exam  Constitutional: She is oriented to person, place, and time. She appears well-developed and well-nourished.  HENT:  Head: Normocephalic and atraumatic.  Eyes: Conjunctivae are normal. Pupils are equal, round, and reactive to light.  Neck: Normal range of motion. Neck supple. Thyromegaly present.  Cardiovascular: Normal rate, regular rhythm, normal heart sounds and intact distal pulses.   Pulmonary/Chest: Effort normal. She has decreased breath sounds in the right upper field, the right middle field, the right lower field, the left upper field, the left middle field and the left lower field. She has no wheezes. She has no rhonchi. She has no rales. Left breast exhibits mass. Left breast exhibits no tenderness.    Musculoskeletal: She exhibits edema.  Pitting +2-3 lower bilateral extremities   Neurological: She is alert and oriented to person, place, and time. She displays no tremor. No cranial nerve deficit. She exhibits abnormal muscle tone. Coordination and gait abnormal. GCS eye subscore is 4. GCS verbal subscore is 5. GCS motor subscore is 6.  antalgic gait. Weakness of left likely secondary to recent fall and fracture left arm  Psychiatric: She has a normal mood and affect. Thought content normal. She is slowed.  Slowed speech likely residual from CVA      Assessment & Plan:  1. Uncontrolled hypertension - COMPLETE METABOLIC PANEL WITH GFR - Thyroid Panel With TSH -Adding Clonidine 0.1 twice daily -Continue Losartan and Coreg   2. Cerebrovascular accident (CVA), unspecified mechanism ([place]) - Lipid panel -Ambulatory referral to Neurology  3. New onset atrial fibrillation ([place]) - Protime-INR  4. Combined systolic and diastolic congestive heart failure, unspecified HF chronicity (HCC) - Brain natriuretic peptide - Ambulatory referral to Cardiology   5. Uncontrolled type 2 diabetes  mellitus with diabetic peripheral angiopathy without gangrene, with long-term current use of insulin (HCC) - POCT glucose (manual entry) Changing medications as follows: Start Lantus 30 units at bedtime at 10:00 pm Start Regular insulin 5 units 3 times daily with Meals only if blood sugar is greater than 125 Discontinue Levemir  6. Electrolyte imbalance - COMPLETE METABOLIC PANEL WITH GFR  7. Closed fracture of left upper extremity with routine healing, subsequent encounter - Lipid panel - VITAMIN D 25 Hydroxy (Vit-D Deficiency, Fractures)  8. Anticoagulated on Coumadin - CBC with Differential - Protime-INR  9. Edema, lower extremity - Lasix 80 mg once daily for 10 day, with [place] with each  dose  -Day 11, resume 40 mg Lasix daily   10. CAD S/P PCI mLAD DES (Promus 3.0 x 24 -- 3.25 mm) -Continue Brilinta, Coreg, and Losartan  -Continue Losartan and Coreg   11. Abnormal renal function -CMP w/GFR pending   12. Breast abscess -Keep follow-up with Dr. Tera Helper. -Continue to change dressing daily and wash wound with warm soap and water. -Suspicious for reoccurring infection, starting on Kelfex 750 mg TID x 14 days   RTC: 1 month Diabetes and Hypertension. Patient emergently referred to Cardiology and has an appointment scheduled for  [facility]06/17 with [facility].  A total of 60 minutes spent, greater than 50 % of this time was spent reviewing prior medical history, reviewing medications and indications of treatment, prior labs and diagnostic tests, discussing current plan of treatment, health promotion, and goals of treatment.  Carroll Sage. [name], MSN, FNP-C [facility]  [name] [place], [place] [address] [phone]

## 2017-01-05 NOTE — Telephone Encounter (Signed)
Patient is schedule for tomorrow at 3:30 with [facility] the PA.

## 2017-01-06 ENCOUNTER — Encounter: Payer: Self-pay | Admitting: Cardiology

## 2017-01-06 ENCOUNTER — Ambulatory Visit (INDEPENDENT_AMBULATORY_CARE_PROVIDER_SITE_OTHER): Payer: Self-pay | Admitting: Cardiology

## 2017-01-06 DIAGNOSIS — Z8673 Personal history of transient ischemic attack (TIA), and cerebral infarction without residual deficits: Secondary | ICD-10-CM

## 2017-01-06 DIAGNOSIS — I2510 Atherosclerotic heart disease of native coronary artery without angina pectoris: Secondary | ICD-10-CM

## 2017-01-06 DIAGNOSIS — Z9861 Coronary angioplasty status: Secondary | ICD-10-CM

## 2017-01-06 DIAGNOSIS — Z9114 Patient's other noncompliance with medication regimen: Secondary | ICD-10-CM | POA: Insufficient documentation

## 2017-01-06 DIAGNOSIS — Z7901 Long term (current) use of anticoagulants: Secondary | ICD-10-CM

## 2017-01-06 DIAGNOSIS — Z91148 Patient's other noncompliance with medication regimen for other reason: Secondary | ICD-10-CM

## 2017-01-06 DIAGNOSIS — I5043 Acute on chronic combined systolic (congestive) and diastolic (congestive) heart failure: Secondary | ICD-10-CM

## 2017-01-06 DIAGNOSIS — I480 Paroxysmal atrial fibrillation: Secondary | ICD-10-CM

## 2017-01-06 DIAGNOSIS — I255 Ischemic cardiomyopathy: Secondary | ICD-10-CM

## 2017-01-06 LAB — BRAIN NATRIURETIC PEPTIDE: Brain Natriuretic Peptide: 1475.1 pg/mL — ABNORMAL HIGH (ref <100)

## 2017-01-06 LAB — THYROID PANEL WITH TSH
Free Thyroxine Index: 3.6 (ref 1.4–3.8)
T3 UPTAKE: 35 % (ref 22–35)
T4 TOTAL: 10.3 ug/dL (ref 4.5–12.0)
TSH: 1.34 mIU/L

## 2017-01-06 LAB — PROTIME-INR
INR: 1.1
PROTHROMBIN TIME: 11.3 s (ref 9.0–11.5)

## 2017-01-06 LAB — VITAMIN D 25 HYDROXY (VIT D DEFICIENCY, FRACTURES): VIT D 25 HYDROXY: 10 ng/mL — AB (ref 30–100)

## 2017-01-06 MED ORDER — FUROSEMIDE 40 MG PO TABS: 40.0000 mg | ORAL_TABLET | Freq: Every day | ORAL | 3 refills | Status: DC

## 2017-01-06 MED FILL — ?FUROSEMIDE 40 MG TABLET: 40 | 30 days supply | Qty: 30 | Fill #0

## 2017-01-06 NOTE — Patient Instructions (Addendum)
Your physician recommends that you schedule a follow-up appointment in:  Next week with Gerrianne Scale PA-C   Continue taking Lasix 80 mg daily for the next 3 days, on Saturday, June 2 , decrease dose to 40 mg daily     Thank you for choosing [facility] !

## 2017-01-06 NOTE — Assessment & Plan Note (Signed)
History of PAF on Coumadin

## 2017-01-06 NOTE — Assessment & Plan Note (Signed)
LAD DES Oct 2017

## 2017-01-06 NOTE — Assessment & Plan Note (Signed)
CHADs VASc= 7

## 2017-01-06 NOTE — Assessment & Plan Note (Signed)
Pt is volume overloaded today on exam with 2+ LE edema, orthopnea, and BNP of 1475

## 2017-01-06 NOTE — Progress Notes (Signed)
01/06/2017 Amber Young   [birth year]  [id number]  Primary Physician Amber Jun, FNP Primary Cardiologist: Dr Amber Young  HPI:  61 y/o female with a history of uncontrolled HTN, past stroke, PAF, and CAD-s/p LAD DES Oct 2017, seen in the office today for follow up. After her PCI she was seen 05/29/16 and Jan 30th in the office. She was then admitted 12/20/16 after a fall at home and subsequent Lt arm fracture. She found to have acute renal injury with SCR of 2.2 and hypotensive. Several of her medications were held and the pt tells me she frankly could not afford her medications secondary to cost. She recently saw Amber Barrows NP [facility]. Ms Amber Young resumed the pt's medication including Coumadin and Lasix. The pt has some orthopnea and LE edema. She admits she eats high sodium foods and has been drinking lots of water.    Current Outpatient Prescriptions  Medication Sig Dispense Refill  . amoxicillin-clavulanate (AUGMENTIN) 875-125 MG tablet Take 1 tablet by mouth 2 (two) times daily.    Marland Kitchen atorvastatin (LIPITOR) 40 MG tablet Take 1 tablet (40 mg total) by mouth daily at 6 PM. 90 tablet 1  . carvedilol (COREG) 25 MG tablet Take 1 tablet (25 mg total) by mouth 2 (two) times daily. 60 tablet 0  . cephALEXin (KEFLEX) 750 MG capsule Take 1 capsule (750 mg total) by mouth 3 (three) times daily. 42 capsule 0  . cloNIDine (CATAPRES) 0.1 MG tablet Take 1 tablet (0.1 mg total) by mouth 2 (two) times daily. 90 tablet 3  . Glucose Blood (BLOOD GLUCOSE TEST STRIPS) STRP Use as directed 100 each 0  . insulin glargine (LANTUS) 100 UNIT/ML injection Inject 0.3 mLs (30 Units total) into the skin at bedtime. 10 mL 11  . insulin lispro (HUMALOG) 100 UNIT/ML injection Inject 0.05 mLs (5 Units total) into the skin 3 (three) times daily with meals. Administer for any blood sugar greater than 125 with meals 10 mL 11  . Insulin Pen Needle 29G X 10MM MISC Use as directed 100 each 0  . INSULIN SYRINGE  .5CC/29G 29G X 1/2" 0.5 ML MISC Use to administer insulin three times daily 200 each 5  . losartan (COZAAR) 50 MG tablet Take 50 mg by mouth 2 (two) times daily.    . potassium chloride SA (K-DUR,KLOR-CON) 20 MEQ tablet Take 2 tablets (40 mEq total) by mouth 2 (two) times daily. 120 tablet 0  . ticagrelor (BRILINTA) 90 MG TABS tablet Take 1 tablet (90 mg total) by mouth 2 (two) times daily. 60 tablet 12  . traMADol (ULTRAM) 50 MG tablet Take 1 tablet (50 mg total) by mouth every 6 (six) hours as needed for severe pain. 30 tablet 0  . warfarin (COUMADIN) 5 MG tablet Take 1 tablet (5 mg total) by mouth daily at 6 PM. 30 tablet 3  . furosemide (LASIX) 40 MG tablet Take 1 tablet (40 mg total) by mouth daily. 90 tablet 3   No current facility-administered medications for this visit.     Allergies  Allergen Reactions  . Ace Inhibitors Cough    Past Medical History:  Diagnosis Date  . Anemia   . CHF (congestive heart failure) ([place])   . Coronary artery disease   . GERD (gastroesophageal reflux disease)   . Heart murmur   . Hyperlipidemia   . Hypertension   . Stroke ([place]) 09/2014   numbness left upper lip, finger tips on left hand; "resolved" (05/18/2016)  .  Type II diabetes mellitus ([place])     Social History   Social History  . Marital status: Married    Spouse name: N/A  . Number of children: N/A  . Years of education: 61   Occupational History  . hairdresser    Social History Main Topics  . Smoking status: Never Smoker  . Smokeless tobacco: Never Used  . Alcohol use No  . Drug use: No  . Sexual activity: Yes   Other Topics Concern  . Not on file   Social History Narrative   Married, 2 children, hairdresser   Caffeine use- occasional tea or coffee   Right handed        Family History  Problem Relation Age of Onset  . Diabetes Mother   . Hypertension Mother   . COPD Mother   . Heart failure Mother   . Hypertension Sister   . Hypertension Brother      Review  of Systems: General: negative for chills, fever, night sweats or weight changes.  Cardiovascular: negative for chest pain, dyspnea on exertion, edema, orthopnea, palpitations, paroxysmal nocturnal dyspnea or shortness of breath Dermatological: negative for rash Respiratory: negative for cough or wheezing Urologic: negative for hematuria Abdominal: negative for nausea, vomiting, diarrhea, bright red blood per rectum, melena, or hematemesis Neurologic: negative for visual changes, syncope, or dizziness All other systems reviewed and are otherwise negative except as noted above.    Blood pressure (!) 148/80, pulse 68, height 5' 6.5" (1.689 m), weight 187 lb (84.8 kg), SpO2 99 %.  General appearance: alert, cooperative and no distress Neck: no carotid bruit and no JVD Lungs: clear to auscultation bilaterally Heart: regular rate and rhythm Extremities: 2+ pitting edema LE Skin: Skin color, texture, turgor normal. 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 I suggested she take Lasix 80 mg daily x 3 days then cut this back to Lasix 40 mg daily. She should come back next week for a B/P check and labs- BNP, BMP, CBC. If possible would get her off Clonidine once her B/P stabilizes.   We discussed the  importance of a low salt diet and I suggested she only drink water when she is thirsty.   Amber Ransom PA-C 01/06/2017 3:52 PM

## 2017-01-06 NOTE — Assessment & Plan Note (Signed)
EF 40-45% Sept 2017

## 2017-01-06 NOTE — Assessment & Plan Note (Signed)
Pt is unemployed, no insurance, could not afford medications

## 2017-01-06 NOTE — Assessment & Plan Note (Signed)
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

## 2017-01-11 ENCOUNTER — Other Ambulatory Visit (INDEPENDENT_AMBULATORY_CARE_PROVIDER_SITE_OTHER): Payer: Self-pay

## 2017-01-11 ENCOUNTER — Other Ambulatory Visit: Payer: Self-pay | Admitting: Family Medicine

## 2017-01-11 DIAGNOSIS — Z7901 Long term (current) use of anticoagulants: Secondary | ICD-10-CM

## 2017-01-11 DIAGNOSIS — E8770 Fluid overload, unspecified: Secondary | ICD-10-CM

## 2017-01-11 NOTE — Progress Notes (Signed)
Lab only visit for BNP and PT/INR, blood pressure check.

## 2017-01-12 ENCOUNTER — Encounter: Payer: Self-pay | Admitting: Neurology

## 2017-01-12 LAB — BRAIN NATRIURETIC PEPTIDE: Brain Natriuretic Peptide: 1040.9 pg/mL — ABNORMAL HIGH (ref <100)

## 2017-01-12 LAB — PROTIME-INR
INR: 1.1
Prothrombin Time: 11.9 s — ABNORMAL HIGH (ref 9.0–11.5)

## 2017-01-13 ENCOUNTER — Ambulatory Visit (INDEPENDENT_AMBULATORY_CARE_PROVIDER_SITE_OTHER): Payer: Self-pay | Admitting: Physician Assistant

## 2017-01-13 ENCOUNTER — Other Ambulatory Visit: Payer: Self-pay | Admitting: Family Medicine

## 2017-01-13 ENCOUNTER — Encounter: Payer: Self-pay | Admitting: Physician Assistant

## 2017-01-13 VITALS — BP 170/80 | HR 60 | Ht 66.5 in | Wt 171.0 lb

## 2017-01-13 DIAGNOSIS — I2510 Atherosclerotic heart disease of native coronary artery without angina pectoris: Secondary | ICD-10-CM

## 2017-01-13 DIAGNOSIS — N289 Disorder of kidney and ureter, unspecified: Secondary | ICD-10-CM

## 2017-01-13 DIAGNOSIS — Z9861 Coronary angioplasty status: Secondary | ICD-10-CM

## 2017-01-13 DIAGNOSIS — I480 Paroxysmal atrial fibrillation: Secondary | ICD-10-CM

## 2017-01-13 DIAGNOSIS — I255 Ischemic cardiomyopathy: Secondary | ICD-10-CM

## 2017-01-13 DIAGNOSIS — I5042 Chronic combined systolic (congestive) and diastolic (congestive) heart failure: Secondary | ICD-10-CM

## 2017-01-13 NOTE — Patient Instructions (Signed)
Your physician recommends that you schedule a follow-up appointment in: 4-6 Weeks with Dr. Bronson Ing   Your physician recommends that you continue on your current medications as directed. Please refer to the Current Medication list given to you today.  If you need a refill on your cardiac medications before your next appointment, please call your pharmacy.  Thank you for choosing [facility]!

## 2017-01-13 NOTE — Progress Notes (Signed)
Cardiology Office Note    Date:  01/13/2017   ID:  Amber Young, DOB 03-06-1956, MRN [id number]  PCP:  Scot Jun, FNP  Cardiologist: Dr. Bronson Ing  Chief Complaint  Patient presents with  . Follow-up    History of Present Illness:  Amber Young is a 61 y.o. female with a history of uncontrolled HTN, past stroke, PAF, and CAD-s/p LAD DES Oct 2017.Patient had a fall in May with left arm fracture and acute renal insufficiency with creatinine of 2.2 and hypotension. Several of her medications were held. She saw Amber Young, [place] 01/06/17 in follow-up and says she can't afford her medications. She did get back on her Coumadin and Lasix. She was eating a high sodium diet and drinking a lot of water. BNP was 1475 creatinine 0.94 She had 2+ edema and was given Lasix 80 mg daily for 3 days and then 40 mg daily. Instructed on low sodium diet.  Patient comes in today for follow-up. Overall she's feeling well and says her swelling is down if she keeps her legs up. She [facility] the [facility] 2 days ago and says her blood pressure was low but her BNP was still over 1000. Her blood pressure is elevated today but she ate a corn dog on her way here. She is trying to eat a low-sodium diet but says she is used to being able to eat whatever she wants and often forgets. She claims to be taking all her medications.   Past Medical History:  Diagnosis Date  . Anemia   . CHF (congestive heart failure) ([place])   . Coronary artery disease   . GERD (gastroesophageal reflux disease)   . Heart murmur   . Hyperlipidemia   . Hypertension   . Stroke ([place]) 09/2014   numbness left upper lip, finger tips on left hand; "resolved" (05/18/2016)  . Type II diabetes mellitus ([place])     Past Surgical History:  Procedure Laterality Date  . CARDIAC CATHETERIZATION N/A 05/18/2016   Procedure: Left Heart Cath and Coronary Angiography;  Surgeon: Jettie Booze, MD;  Location: [facility];   Service: Cardiovascular;  Laterality: N/A;  . CARDIAC CATHETERIZATION N/A 05/18/2016   Procedure: Coronary Stent Intervention;  Surgeon: Jettie Booze, MD;  Location: [facility];  Service: Cardiovascular;  Laterality: N/A;  . CORONARY ANGIOPLASTY    . TUBAL LIGATION  1980  . VAGINAL HYSTERECTOMY  1999   "partial; fibroids"    Current Medications: Outpatient Medications Prior to Visit  Medication Sig Dispense Refill  . amoxicillin-clavulanate (AUGMENTIN) 875-125 MG tablet Take 1 tablet by mouth 2 (two) times daily.    Marland Kitchen atorvastatin (LIPITOR) 40 MG tablet Take 1 tablet (40 mg total) by mouth daily at 6 PM. 90 tablet 1  . carvedilol (COREG) 25 MG tablet Take 1 tablet (25 mg total) by mouth 2 (two) times daily. 60 tablet 0  . cephALEXin (KEFLEX) 750 MG capsule Take 1 capsule (750 mg total) by mouth 3 (three) times daily. 42 capsule 0  . cloNIDine (CATAPRES) 0.1 MG tablet Take 1 tablet (0.1 mg total) by mouth 2 (two) times daily. 90 tablet 3  . furosemide (LASIX) 40 MG tablet Take 1 tablet (40 mg total) by mouth daily. 90 tablet 3  . Glucose Blood (BLOOD GLUCOSE TEST STRIPS) STRP Use as directed 100 each 0  . insulin glargine (LANTUS) 100 UNIT/ML injection Inject 0.3 mLs (30 Units total) into the skin at bedtime. 10 mL  11  . insulin lispro (HUMALOG) 100 UNIT/ML injection Inject 0.05 mLs (5 Units total) into the skin 3 (three) times daily with meals. Administer for any blood sugar greater than 125 with meals 10 mL 11  . Insulin Pen Needle 29G X 10MM MISC Use as directed 100 each 0  . INSULIN SYRINGE .5CC/29G 29G X 1/2" 0.5 ML MISC Use to administer insulin three times daily 200 each 5  . losartan (COZAAR) 50 MG tablet Take 50 mg by mouth 2 (two) times daily.    . potassium chloride SA (K-DUR,KLOR-CON) 20 MEQ tablet Take 2 tablets (40 mEq total) by mouth 2 (two) times daily. 120 tablet 0  . ticagrelor (BRILINTA) 90 MG TABS tablet Take 1 tablet (90 mg total) by mouth 2 (two) times  daily. 60 tablet 12  . traMADol (ULTRAM) 50 MG tablet Take 1 tablet (50 mg total) by mouth every 6 (six) hours as needed for severe pain. 30 tablet 0  . warfarin (COUMADIN) 5 MG tablet Take 1 tablet (5 mg total) by mouth daily at 6 PM. 30 tablet 3   No facility-administered medications prior to visit.      Allergies:   Ace inhibitors   Social History   Social History  . Marital status: Married    Spouse name: N/A  . Number of children: N/A  . Years of education: 36   Occupational History  . hairdresser    Social History Main Topics  . Smoking status: Never Smoker  . Smokeless tobacco: Never Used  . Alcohol use No  . Drug use: No  . Sexual activity: Yes   Other Topics Concern  . None   Social History Narrative   Married, 2 children, hairdresser   Caffeine use- occasional tea or coffee   Right handed        Family History:  The patient's family history includes COPD in her mother; Diabetes in her mother; Heart failure in her mother; Hypertension in her brother, mother, and sister.   ROS:   Please see the history of present illness.    Review of Systems  Constitution: Negative.  HENT: Negative.   Eyes: Negative.   Cardiovascular: Positive for leg swelling.  Respiratory: Negative.   Hematologic/Lymphatic: Negative.   Musculoskeletal: Positive for joint pain.       Broken left arm  Gastrointestinal: Negative.   Genitourinary: Negative.   Neurological: Negative.    All other systems reviewed and are negative.   PHYSICAL EXAM:   VS:  BP (!) 170/80   Pulse 60   Ht 5' 6.5" (1.689 m)   Wt 171 lb (77.6 kg)   SpO2 99%   BMI 27.19 kg/m   Physical Exam  GEN: Well nourished, well developed, in no acute distress  Neck: no JVD, carotid bruits, or masses Cardiac:RRR; no murmurs, rubs, or gallops  Respiratory:  clear to auscultation bilaterally, normal work of breathing GI: soft, nontender, nondistended, + BS Ext: +1-2 edema bilaterally up to her knees without  cyanosis, clubbing, Good distal pulses bilaterally Neuro:  Alert and Oriented x 3 Psych: euthymic mood, full affect  Wt Readings from Last 3 Encounters:  01/13/17 171 lb (77.6 kg)  01/06/17 187 lb (84.8 kg)  01/05/17 190 lb (86.2 kg)      Studies/Labs Reviewed:   EKG:  EKG is not ordered today.   Recent Labs: 12/25/2016: Magnesium 1.9 01/05/2017: ALT 26; BUN 15; Creat 0.94; Hemoglobin 9.8; Platelets 323; Potassium 4.8; Sodium 137; TSH 1.34 01/11/2017:  Brain Natriuretic Peptide 1,040.9   Lipid Panel    Component Value Date/Time   CHOL 190 01/05/2017 1146   TRIG 74 01/05/2017 1146   HDL 80 01/05/2017 1146   CHOLHDL 2.4 01/05/2017 1146   VLDL 15 01/05/2017 1146   LDLCALC 95 01/05/2017 1146    Additional studies/ records that were reviewed today include:  Cath results (05/18/16):  Prox RCA lesion, 50 %stenosed. Some catheter dampening noted at times but this may have been related to catheter induced spasm based on the angiogram.  Ost RCA lesion, 50 %stenosed.  Ost 2nd Diag to 2nd Diag lesion, 80 %stenosed.  Mid LAD-1 lesion, 25 %stenosed.  1st Diag lesion, 25 %stenosed.  Mid LAD-2 lesion, 80 %stenosed. A STENT PROMUS PREM MR 3.0X24 drug eluting stent was successfully placed, and overlaps previously placed stent, postdilated to 3.25 mm.  Post intervention, there is a 0% residual stenosis.  The left ventricular ejection fraction is 40-45% by visual estimate.  There is mild left ventricular systolic dysfunction.  LV end diastolic pressure is moderately elevated.   Dual antiplatelet therapy for 12 months.  Continue aggressive secondary prevention.  During the procedure, the patient had paroxysmal SVT episodes.   2-D echo 04/27/16 Study Conclusions   - Left ventricle: The cavity size was normal. Wall thickness was   increased increased in a pattern of mild to moderate LVH.   Systolic function was mildly to moderately reduced. The estimated   ejection fraction was in the  range of 40% to 45%. Doppler   parameters are consistent with restrictive physiology, indicative   of decreased left ventricular diastolic compliance and/or   increased left atrial pressure. - Regional wall motion abnormality: Hypokinesis of the mid   anterior, basal-mid anteroseptal, and mid anterolateral   myocardium. - Aortic valve: Mildly calcified annulus. Trileaflet; mildly   thickened leaflets. Valve area (VTI): 2.72 cm^2. Valve area   (Vmax): 2.64 cm^2. Valve area (Vmean): 2.23 cm^2. - Mitral valve: Mildly calcified annulus. Mildly thickened leaflets   . There was mild regurgitation. - Left atrium: The atrium was moderately dilated. - Right ventricle: Systolic function was mildly reduced. - Right atrium: The atrium was mildly dilated. - Atrial septum: No defect or patent foramen ovale was identified. - Tricuspid valve: There was moderate regurgitation. - Pulmonary arteries: Systolic pressure was mildly increased. PA   peak pressure: 36 mm Hg (S). - Technically adequate study.      ASSESSMENT:    1. Chronic combined systolic and diastolic CHF (congestive heart failure) ([place])   2. CAD S/P PCI mLAD DES (Promus 3.0 x 24 -- 3.25 mm)   3. Cardiomyopathy, ischemic   4. PAF (paroxysmal atrial fibrillation) (HCC)      PLAN:  In order of problems listed above:  Chronic combined systolic and diastolic CHF EF 25-63% on most recent echo. Patient has lost 16 pounds since seen last week. Her BNP was still elevated 2 days ago and she still has some edema.. She is breathing easier. I'm reluctant to increase her Lasix again with the weight loss. Continue current dose Lasix and reiterate 2 g sodium diet which is a major problem for her. Follow-up with Dr. Bronson Ing in 4-6 wks.  CAD status post PCI/DES to the LAD 05/2016 recommend dual antiplatelet therapy for 12 months. Continue Brilinta and Coumadin  Ischemic cardiomyopathy LVEF 40-45%  PAF CHADSVASC=7 rate controlled with Coreg 25  mg twice a day and on Coumadin    Medication Adjustments/Labs and Tests Ordered: Current  medicines are reviewed at length with the patient today.  Concerns regarding medicines are outlined above.  Medication changes, Labs and Tests ordered today are listed in the Patient Instructions below. Patient Instructions  Your physician recommends that you schedule a follow-up appointment in: 4-6 Weeks with Dr. Bronson Ing   Your physician recommends that you continue on your current medications as directed. Please refer to the Current Medication list given to you today.  If you need a refill on your cardiac medications before your next appointment, please call your pharmacy.  Thank you for choosing [facility]!       Signed, Ermalinda Barrios, PA-C  01/13/2017 2:26 PM    [facility] [address] [place], [place], [place]  [address] Phone: [phone]; Fax: [phone]

## 2017-01-13 NOTE — Progress Notes (Signed)
Referral to nephrology worsening renal function

## 2017-01-14 ENCOUNTER — Other Ambulatory Visit: Payer: Self-pay | Admitting: Family Medicine

## 2017-01-14 NOTE — Progress Notes (Signed)
Patient notified

## 2017-01-14 NOTE — Progress Notes (Signed)
Patient schedule at [facility] on 6/12 at 1145am.

## 2017-01-14 NOTE — Progress Notes (Unsigned)
Do not process referral to Nephrology as it is not indicated. Renal function as since normalized.  Patient is on a chronic lifetime Coumadin and would like to establish at the Coumadin Clinic at [facility] on [facility]. Previously followed at a [facility] in [place]. Last PT/INR 1.1. I am managing patient here in office until we can get her linked with a Coumadin Clinic.  Amber Young. [name], MSN, FNP-C [facility]  [name] [place], [place] [address] [phone]

## 2017-01-18 ENCOUNTER — Encounter: Payer: Self-pay | Admitting: Family Medicine

## 2017-01-18 ENCOUNTER — Ambulatory Visit (INDEPENDENT_AMBULATORY_CARE_PROVIDER_SITE_OTHER): Payer: Self-pay | Admitting: Family Medicine

## 2017-01-18 VITALS — BP 180/78 | HR 84 | Temp 98.6°F | Resp 14 | Ht 66.5 in | Wt 167.0 lb

## 2017-01-18 DIAGNOSIS — R319 Hematuria, unspecified: Secondary | ICD-10-CM

## 2017-01-18 DIAGNOSIS — I2510 Atherosclerotic heart disease of native coronary artery without angina pectoris: Secondary | ICD-10-CM

## 2017-01-18 DIAGNOSIS — I10 Essential (primary) hypertension: Secondary | ICD-10-CM

## 2017-01-18 DIAGNOSIS — R197 Diarrhea, unspecified: Secondary | ICD-10-CM

## 2017-01-18 DIAGNOSIS — Z9861 Coronary angioplasty status: Secondary | ICD-10-CM

## 2017-01-18 DIAGNOSIS — I255 Ischemic cardiomyopathy: Secondary | ICD-10-CM

## 2017-01-18 LAB — POCT URINALYSIS DIP (DEVICE)
BILIRUBIN URINE: NEGATIVE
Glucose, UA: 100 mg/dL — AB
Ketones, ur: NEGATIVE mg/dL
NITRITE: NEGATIVE
Protein, ur: >=300 mg/dL — AB
Specific Gravity, Urine: 1.025 (ref 1.005–1.030)
Urobilinogen, UA: 0.2 mg/dL (ref 0.0–1.0)
pH: 7 (ref 5.0–8.0)

## 2017-01-18 LAB — CBC WITH DIFFERENTIAL/PLATELET
BASOS PCT: 0 %
Basophils Absolute: 0 cells/uL (ref 0–200)
EOS ABS: 212 {cells}/uL (ref 15–500)
Eosinophils Relative: 4 %
HEMATOCRIT: 29.3 % — AB (ref 35.0–45.0)
Hemoglobin: 9.3 g/dL — ABNORMAL LOW (ref 11.7–15.5)
Lymphocytes Relative: 30 %
Lymphs Abs: 1590 cells/uL (ref 850–3900)
MCH: 24.5 pg — ABNORMAL LOW (ref 27.0–33.0)
MCHC: 31.7 g/dL — ABNORMAL LOW (ref 32.0–36.0)
MCV: 77.1 fL — AB (ref 80.0–100.0)
MONO ABS: 371 {cells}/uL (ref 200–950)
MPV: 8.8 fL (ref 7.5–12.5)
Monocytes Relative: 7 %
NEUTROS ABS: 3127 {cells}/uL (ref 1500–7800)
Neutrophils Relative %: 59 %
Platelets: 287 10*3/uL (ref 140–400)
RBC: 3.8 MIL/uL (ref 3.80–5.10)
RDW: 16.7 % — ABNORMAL HIGH (ref 11.0–15.0)
WBC: 5.3 10*3/uL (ref 3.8–10.8)

## 2017-01-18 LAB — COMPLETE METABOLIC PANEL WITH GFR
ALT: 17 U/L (ref 6–29)
AST: 21 U/L (ref 10–35)
Albumin: 3 g/dL — ABNORMAL LOW (ref 3.6–5.1)
Alkaline Phosphatase: 246 U/L — ABNORMAL HIGH (ref 33–130)
BUN: 17 mg/dL (ref 7–25)
CHLORIDE: 105 mmol/L (ref 98–110)
CO2: 22 mmol/L (ref 20–31)
CREATININE: 1.04 mg/dL — AB (ref 0.50–0.99)
Calcium: 8.7 mg/dL (ref 8.6–10.4)
GFR, EST AFRICAN AMERICAN: 67 mL/min (ref >=60)
GFR, EST NON AFRICAN AMERICAN: 59 mL/min — AB (ref >=60)
Glucose, Bld: 108 mg/dL — ABNORMAL HIGH (ref 65–99)
Potassium: 4 mmol/L (ref 3.5–5.3)
SODIUM: 137 mmol/L (ref 135–146)
Total Bilirubin: 0.4 mg/dL (ref 0.2–1.2)
Total Protein: 6.7 g/dL (ref 6.1–8.1)

## 2017-01-18 MED ORDER — LOSARTAN POTASSIUM 50 MG PO TABS: 50.0000 mg | ORAL_TABLET | Freq: Every day | ORAL | 3 refills | Status: DC

## 2017-01-18 MED ORDER — DIPHENOXYLATE-ATROPINE 2.5-0.025 MG PO TABS: 1.0000 | ORAL_TABLET | Freq: Three times a day (TID) | ORAL | 0 refills | Status: AC | PRN

## 2017-01-18 MED ORDER — RANITIDINE HCL 150 MG PO TABS: 150.0000 mg | ORAL_TABLET | Freq: Two times a day (BID) | ORAL | 0 refills | Status: DC

## 2017-01-18 MED ORDER — CLONIDINE HCL 0.1 MG PO TABS
0.1000 mg | ORAL_TABLET | Freq: Once | ORAL | Status: AC
  Administered 2017-01-18: 0.1 mg via ORAL

## 2017-01-18 NOTE — Progress Notes (Signed)
Patient ID: Amber Young, female    DOB: 1956-08-02, 61 y.o.   MRN: [id number]  PCP: Scot Jun, FNP  Chief Complaint  Patient presents with  . Follow-up    blood pressure,diabetes    Subjective:  HPI Amber Young is a 61 y.o. female presents for evaluation of hypertension and   Hypertension Amber Young reports no home monitoring of blood pressure. Reports adherence to blood pressure medications, although she has . Reports efforts to adhere to low sodium diet. She is a nonsmoker. Denies any episodes of dizziness, headaches, shortness of breath, or chest pain. Amber Young's blood pressure has been poorly controlled for an extended period of time. On 01/05/2017, Clonidine 0.1 mg BID was added to her medication, to improve readings. She reports today taking all of her medication, but reports she hadn't taken the clonidine as of yet. She returned to the office on  01/11/2017 and her blood pressure was stable in the 140's /80's although the reading was not documented from the nurse visit. During a recent cardiology appointment she verbalized that she could not afford medications, however she has been linked with the [facility] which will ensure that she obtains all prescribed chronic cardiac medications. She verbalizes to me that she is taking her medications as prescribed. Swelling in her legs have significantly improved since last visit.  Left Arm Fracture  Amber Young reports contacting orthopedic surgery to schedule a follow-up appointment regarding her left arm fracture which occurred 12/20/2016. However, she was unable to schedule a follow-up as she is uninsured and does not have required down payment for her initial follow-up visit. She reports consistently wear her arm in the sling. She denies any pain or diminished sensation in fingers. She still remains unclear as to her insurance status as far as pending approval for medicaid.  Diabetes  Amber Young doesn't monitor glucose at home. However, she has monitored glucose more closely over the last several week as her insulin dosage was changed during her 01/05/17 visit. She is currently administering Lantus at bedtime. Reports most recent readings have been in the low to mid 100s since initiating Lantus. She reports one reading however dropped into the low 50s however she did not eat a bedtime snack due to multiple diarrheal episodes. She reports attending a picnic recently and eating some potato salad that had been sitting out and has had 2 days of diarrhea. She did ingest food today and was able to tolerate it without nausea or vomiting however she has still had a couple episodes of diarrhea this morning. She feels her blood sugars have improved substantially since initiating the new regimen but she was placed on during her last office visit on 01/05/17.  Social History   Social History  . Marital status: Married    Spouse name: N/A  . Number of children: N/A  . Years of education: 55   Occupational History  . hairdresser    Social History Main Topics  . Smoking status: Never Smoker  . Smokeless tobacco: Never Used  . Alcohol use No  . Drug use: No  . Sexual activity: Yes   Other Topics Concern  . Not on file   Social History Narrative   Married, 2 children, hairdresser   Caffeine use- occasional tea or coffee   Right handed       Family History  Problem Relation Age of Onset  . Diabetes Mother   . Hypertension Mother   . COPD Mother   .  Heart failure Mother   . Hypertension Sister   . Hypertension Brother    Review of Systems See HPI Patient Active Problem List   Diagnosis Date Noted  . Chronic anticoagulation 01/06/2017  . Non compliance w medication regimen 01/06/2017  . Breast abscess 12/20/2016  . Encounter for therapeutic drug monitoring 05/29/2016  . CAD S/P PCI mLAD DES (Promus 3.0 x 24 -- 3.25 mm) 05/19/2016  . PAF (paroxysmal atrial fibrillation)  ([place]) 05/19/2016  . Cardiomyopathy, ischemic     Class: Diagnosis of  . Anemia 04/26/2016  . Acute on chronic combined systolic and diastolic CHF (congestive heart failure) ([place]) 11/14/2015  . Hypokalemia 11/14/2015  . Hypertensive emergency   . Uncontrolled hypertension 09/30/2014  . Uncontrolled diabetes mellitus ([place]) 09/30/2014  . History of stroke 09/30/2014    Allergies  Allergen Reactions  . Ace Inhibitors Cough    Prior to Admission medications   Medication Sig Start Date End Date Taking? Authorizing Provider  amoxicillin-clavulanate (AUGMENTIN) 875-125 MG tablet Take 1 tablet by mouth 2 (two) times daily.   Yes [provider]  atorvastatin (LIPITOR) 40 MG tablet Take 1 tablet (40 mg total) by mouth daily at 6 PM. 01/05/17  Yes Scot Jun, FNP  carvedilol (COREG) 25 MG tablet Take 1 tablet (25 mg total) by mouth 2 (two) times daily. 12/25/16  Yes Patrecia Pour, Christean Grief, MD  cephALEXin (KEFLEX) 750 MG capsule Take 1 capsule (750 mg total) by mouth 3 (three) times daily. 01/05/17 01/19/17 Yes Scot Jun, FNP  cloNIDine (CATAPRES) 0.1 MG tablet Take 1 tablet (0.1 mg total) by mouth 2 (two) times daily. 01/05/17  Yes Scot Jun, FNP  furosemide (LASIX) 40 MG tablet Take 1 tablet (40 mg total) by mouth daily. 01/06/17 04/06/17 Yes Kilroy, Luke K, PA-C  Glucose Blood (BLOOD GLUCOSE TEST STRIPS) STRP Use as directed 11/17/15  Yes Memon, Jolaine Artist, MD  insulin glargine (LANTUS) 100 UNIT/ML injection Inject 0.3 mLs (30 Units total) into the skin at bedtime. 01/05/17  Yes Scot Jun, FNP  insulin lispro (HUMALOG) 100 UNIT/ML injection Inject 0.05 mLs (5 Units total) into the skin 3 (three) times daily with meals. Administer for any blood sugar greater than 125 with meals 01/05/17  Yes Scot Jun, FNP  Insulin Pen Needle 29G X 10MM MISC Use as directed 11/17/15  Yes Kathie Dike, MD  INSULIN SYRINGE .5CC/29G 29G X 1/2" 0.5 ML MISC Use to administer  insulin three times daily 01/05/17  Yes Scot Jun, FNP  losartan (COZAAR) 50 MG tablet Take 50 mg by mouth 2 (two) times daily.   Yes [provider]  potassium chloride SA (K-DUR,KLOR-CON) 20 MEQ tablet Take 2 tablets (40 mEq total) by mouth 2 (two) times daily. 01/05/17  Yes Scot Jun, FNP  ticagrelor (BRILINTA) 90 MG TABS tablet Take 1 tablet (90 mg total) by mouth 2 (two) times daily. 05/20/16  Yes Cheryln Manly, NP  traMADol (ULTRAM) 50 MG tablet Take 1 tablet (50 mg total) by mouth every 6 (six) hours as needed for severe pain. 12/25/16  Yes Patrecia Pour, Christean Grief, MD  warfarin (COUMADIN) 5 MG tablet Take 1 tablet (5 mg total) by mouth daily at 6 PM. 01/05/17  Yes Scot Jun, FNP    Past Medical, Surgical Family and Social History reviewed and updated.    Objective:   Today's Vitals   01/18/17 0917  Weight: 167 lb (75.8 kg)  Height: 5' 6.5" (1.689 m)  Wt Readings from Last 3 Encounters:  01/18/17 167 lb (75.8 kg)  01/13/17 171 lb (77.6 kg)  01/06/17 187 lb (84.8 kg)    Physical Exam         Assessment & Plan:  1. Essential hypertension - Brain natriuretic peptide - cloNIDine (CATAPRES) tablet 0.1 mg; Take 1 tablet (0.1 mg total) by mouth once. - COMPLETE METABOLIC PANEL WITH GFR - Microalbumin, urine  2. Cardiomyopathy, ischemic -Stable continue follow-up with cardiology. -Repeat BNP. Edema seems to be well-controlled and lower extremities. -Continue Lasix as prescribed.  3. CAD S/P PCI mLAD DES (Promus 3.0 x 24 -- 3.25 mm) -She is on long-term anticoagulant therapy with Brilinta and Coumadin. -CHA2DS2-VASc score of 7 -Last PT/INR was 1.1, non-therapeutic. She is currently taken 7.5 mg of Coumadin at present and has a scheduled appointment with the Coumadin clinic on tomorrow. We'll defer any further Coumadin management to the Coumadin clinic.   4. Diarrhea, unspecified type - CBC with Differential -Take Lomotil as  prescribed.  5. Hematuria, unspecified type -Persistent hematuria on UA. Will consider referral to urology however at present patient cannot afford the cost of the specialty visit will consider once she has confirmed her eligibility for Medicaid.   RTC: 6 weeks  , recheck urine rine again for hematuria, BP, and DM.    Carroll Sage. [name], MSN, FNP-C [facility]  [name], [place] [address] [phone]

## 2017-01-18 NOTE — Patient Instructions (Addendum)
To obtain your Medicaid status, please contact [facility] at [phone].  Please contact me once you've been notified about your medicaid status in order to refer you to orthopedic surgery for evaluation of left arm fracture.   Continue to monitor your blood sugar. No changes to insulin. Eat a snack at bedtime with your nighttime insulin.  Continue to monitor your blood pressure. Goal less than 150/90    Hypertension Hypertension is another name for high blood pressure. High blood pressure forces your heart to work harder to pump blood. This can cause problems over time. There are two numbers in a blood pressure reading. There is a top number (systolic) over a bottom number (diastolic). It is best to have a blood pressure below 120/80. Healthy choices can help lower your blood pressure. You may need medicine to help lower your blood pressure if:  Your blood pressure cannot be lowered with healthy choices.  Your blood pressure is higher than 130/80.  Follow these instructions at home: Eating and drinking  If directed, follow the DASH eating plan. This diet includes: ? Filling half of your plate at each meal with fruits and vegetables. ? Filling one quarter of your plate at each meal with whole grains. Whole grains include whole wheat pasta, brown rice, and whole grain bread. ? Eating or drinking low-fat dairy products, such as skim milk or low-fat yogurt. ? Filling one quarter of your plate at each meal with low-fat (lean) proteins. Low-fat proteins include fish, skinless chicken, eggs, beans, and tofu. ? Avoiding fatty meat, cured and processed meat, or chicken with skin. ? Avoiding premade or processed food.  Eat less than 1,500 mg of salt (sodium) a day.  Limit alcohol use to no more than 1 drink a day for nonpregnant women and 2 drinks a day for men. One drink equals 12 oz of beer, 5 oz of wine, or 1 oz of hard liquor. Lifestyle  Work with your doctor to  stay at a healthy weight or to lose weight. Ask your doctor what the best weight is for you.  Get at least 30 minutes of exercise that causes your heart to beat faster (aerobic exercise) most days of the week. This may include walking, swimming, or biking.  Get at least 30 minutes of exercise that strengthens your muscles (resistance exercise) at least 3 days a week. This may include lifting weights or pilates.  Do not use any products that contain nicotine or tobacco. This includes cigarettes and e-cigarettes. If you need help quitting, ask your doctor.  Check your blood pressure at home as told by your doctor.  Keep all follow-up visits as told by your doctor. This is important. Medicines  Take over-the-counter and prescription medicines only as told by your doctor. Follow directions carefully.  Do not skip doses of blood pressure medicine. The medicine does not work as well if you skip doses. Skipping doses also puts you at risk for problems.  Ask your doctor about side effects or reactions to medicines that you should watch for. Contact a doctor if:  You think you are having a reaction to the medicine you are taking.  You have headaches that keep coming back (recurring).  You feel dizzy.  You have swelling in your ankles.  You have trouble with your vision. Get help right away if:  You get a very bad headache.  You start to feel confused.  You feel weak or numb.  You feel faint.  You  get very bad pain in your: ? Chest. ? Belly (abdomen).  You throw up (vomit) more than once.  You have trouble breathing. Summary  Hypertension is another name for high blood pressure.  Making healthy choices can help lower blood pressure. If your blood pressure cannot be controlled with healthy choices, you may need to take medicine. This information is not intended to replace advice given to you by your health care provider. Make sure you discuss any questions you have with your  health care provider. Document Released: 01/13/2008 Document Revised: 06/24/2016 Document Reviewed: 06/24/2016 Elsevier Interactive Patient Education  [employer].

## 2017-01-19 LAB — BRAIN NATRIURETIC PEPTIDE: BRAIN NATRIURETIC PEPTIDE: 349.8 pg/mL — AB (ref <100)

## 2017-01-19 LAB — MICROALBUMIN, URINE: Microalb, Ur: 77.2 mg/dL

## 2017-01-21 ENCOUNTER — Ambulatory Visit: Payer: Self-pay | Attending: Family Medicine

## 2017-01-22 MED ORDER — POTASSIUM CHLORIDE CRYS ER 20 MEQ PO TBCR: 40.0000 meq | EXTENDED_RELEASE_TABLET | Freq: Every day | ORAL | 0 refills | Status: DC

## 2017-01-22 MED ORDER — FERROUS SULFATE 325 (65 FE) MG PO TABS: 325.0000 mg | ORAL_TABLET | Freq: Every day | ORAL | 3 refills | Status: DC

## 2017-01-22 NOTE — Addendum Note (Signed)
Addended by: Scot Jun on: 01/22/2017 06:41 AM   Modules accepted: Orders

## 2017-01-27 ENCOUNTER — Telehealth: Payer: Self-pay

## 2017-01-28 ENCOUNTER — Ambulatory Visit (INDEPENDENT_AMBULATORY_CARE_PROVIDER_SITE_OTHER): Payer: Self-pay | Admitting: Pharmacist

## 2017-01-28 DIAGNOSIS — I4891 Unspecified atrial fibrillation: Secondary | ICD-10-CM

## 2017-01-28 DIAGNOSIS — Z5181 Encounter for therapeutic drug level monitoring: Secondary | ICD-10-CM

## 2017-01-28 DIAGNOSIS — I639 Cerebral infarction, unspecified: Secondary | ICD-10-CM

## 2017-01-28 DIAGNOSIS — I480 Paroxysmal atrial fibrillation: Secondary | ICD-10-CM

## 2017-01-28 LAB — POCT INR: INR: 2

## 2017-01-29 ENCOUNTER — Ambulatory Visit (HOSPITAL_COMMUNITY): Payer: Self-pay

## 2017-02-02 ENCOUNTER — Ambulatory Visit: Payer: Self-pay | Admitting: Surgery

## 2017-02-05 ENCOUNTER — Ambulatory Visit: Payer: Self-pay | Admitting: Family Medicine

## 2017-02-08 ENCOUNTER — Encounter (HOSPITAL_COMMUNITY): Payer: Self-pay

## 2017-02-08 ENCOUNTER — Ambulatory Visit (INDEPENDENT_AMBULATORY_CARE_PROVIDER_SITE_OTHER): Payer: Self-pay | Admitting: Family Medicine

## 2017-02-08 ENCOUNTER — Inpatient Hospital Stay (HOSPITAL_COMMUNITY)
Admission: EM | Admit: 2017-02-08 | Discharge: 2017-02-11 | DRG: 292 | Disposition: A | Discharge status: Home Health | Payer: Self-pay | Attending: Internal Medicine | Admitting: Internal Medicine

## 2017-02-08 ENCOUNTER — Emergency Department (HOSPITAL_COMMUNITY): Payer: Self-pay

## 2017-02-08 ENCOUNTER — Encounter: Payer: Self-pay | Admitting: Family Medicine

## 2017-02-08 VITALS — BP 180/106 | HR 97 | Temp 98.0°F | Resp 14 | Ht 66.5 in | Wt 161.0 lb

## 2017-02-08 DIAGNOSIS — Z7901 Long term (current) use of anticoagulants: Secondary | ICD-10-CM

## 2017-02-08 DIAGNOSIS — D509 Iron deficiency anemia, unspecified: Secondary | ICD-10-CM | POA: Diagnosis present

## 2017-02-08 DIAGNOSIS — Z825 Family history of asthma and other chronic lower respiratory diseases: Secondary | ICD-10-CM

## 2017-02-08 DIAGNOSIS — E1151 Type 2 diabetes mellitus with diabetic peripheral angiopathy without gangrene: Secondary | ICD-10-CM

## 2017-02-08 DIAGNOSIS — I5042 Chronic combined systolic (congestive) and diastolic (congestive) heart failure: Secondary | ICD-10-CM

## 2017-02-08 DIAGNOSIS — I10 Essential (primary) hypertension: Secondary | ICD-10-CM | POA: Diagnosis present

## 2017-02-08 DIAGNOSIS — Z9114 Patient's other noncompliance with medication regimen: Secondary | ICD-10-CM

## 2017-02-08 DIAGNOSIS — Z79899 Other long term (current) drug therapy: Secondary | ICD-10-CM

## 2017-02-08 DIAGNOSIS — I2510 Atherosclerotic heart disease of native coronary artery without angina pectoris: Secondary | ICD-10-CM | POA: Diagnosis present

## 2017-02-08 DIAGNOSIS — Z955 Presence of coronary angioplasty implant and graft: Secondary | ICD-10-CM

## 2017-02-08 DIAGNOSIS — I248 Other forms of acute ischemic heart disease: Secondary | ICD-10-CM | POA: Diagnosis present

## 2017-02-08 DIAGNOSIS — R739 Hyperglycemia, unspecified: Secondary | ICD-10-CM

## 2017-02-08 DIAGNOSIS — Z833 Family history of diabetes mellitus: Secondary | ICD-10-CM

## 2017-02-08 DIAGNOSIS — I509 Heart failure, unspecified: Secondary | ICD-10-CM

## 2017-02-08 DIAGNOSIS — Z794 Long term (current) use of insulin: Secondary | ICD-10-CM

## 2017-02-08 DIAGNOSIS — Z9111 Patient's noncompliance with dietary regimen: Secondary | ICD-10-CM

## 2017-02-08 DIAGNOSIS — Z8673 Personal history of transient ischemic attack (TIA), and cerebral infarction without residual deficits: Secondary | ICD-10-CM

## 2017-02-08 DIAGNOSIS — I255 Ischemic cardiomyopathy: Secondary | ICD-10-CM | POA: Diagnosis present

## 2017-02-08 DIAGNOSIS — I160 Hypertensive urgency: Secondary | ICD-10-CM

## 2017-02-08 DIAGNOSIS — Z8249 Family history of ischemic heart disease and other diseases of the circulatory system: Secondary | ICD-10-CM

## 2017-02-08 DIAGNOSIS — I480 Paroxysmal atrial fibrillation: Secondary | ICD-10-CM | POA: Diagnosis present

## 2017-02-08 DIAGNOSIS — I110 Hypertensive heart disease with heart failure: Principal | ICD-10-CM | POA: Diagnosis present

## 2017-02-08 DIAGNOSIS — I5043 Acute on chronic combined systolic (congestive) and diastolic (congestive) heart failure: Secondary | ICD-10-CM | POA: Diagnosis present

## 2017-02-08 DIAGNOSIS — E1165 Type 2 diabetes mellitus with hyperglycemia: Secondary | ICD-10-CM | POA: Diagnosis present

## 2017-02-08 DIAGNOSIS — Z7902 Long term (current) use of antithrombotics/antiplatelets: Secondary | ICD-10-CM

## 2017-02-08 DIAGNOSIS — Z9861 Coronary angioplasty status: Secondary | ICD-10-CM

## 2017-02-08 DIAGNOSIS — L729 Follicular cyst of the skin and subcutaneous tissue, unspecified: Secondary | ICD-10-CM | POA: Diagnosis present

## 2017-02-08 DIAGNOSIS — IMO0002 Reserved for concepts with insufficient information to code with codable children: Secondary | ICD-10-CM | POA: Diagnosis present

## 2017-02-08 DIAGNOSIS — G4700 Insomnia, unspecified: Secondary | ICD-10-CM | POA: Diagnosis present

## 2017-02-08 DIAGNOSIS — K219 Gastro-esophageal reflux disease without esophagitis: Secondary | ICD-10-CM | POA: Diagnosis present

## 2017-02-08 DIAGNOSIS — E785 Hyperlipidemia, unspecified: Secondary | ICD-10-CM | POA: Diagnosis present

## 2017-02-08 LAB — CBC WITH DIFFERENTIAL/PLATELET
BASOS PCT: 0 %
Basophils Absolute: 0 cells/uL (ref 0–200)
EOS PCT: 0 %
Eosinophils Absolute: 0 cells/uL — ABNORMAL LOW (ref 15–500)
HCT: 31.1 % — ABNORMAL LOW (ref 35.0–45.0)
HEMOGLOBIN: 9.8 g/dL — AB (ref 11.7–15.5)
LYMPHS ABS: 1656 {cells}/uL (ref 850–3900)
Lymphocytes Relative: 24 %
MCH: 24.3 pg — ABNORMAL LOW (ref 27.0–33.0)
MCHC: 31.5 g/dL — ABNORMAL LOW (ref 32.0–36.0)
MCV: 77 fL — ABNORMAL LOW (ref 80.0–100.0)
MPV: 9.1 fL (ref 7.5–12.5)
Monocytes Absolute: 552 cells/uL (ref 200–950)
Monocytes Relative: 8 %
NEUTROS ABS: 4692 {cells}/uL (ref 1500–7800)
Neutrophils Relative %: 68 %
Platelets: 280 10*3/uL (ref 140–400)
RBC: 4.04 MIL/uL (ref 3.80–5.10)
RDW: 16.7 % — ABNORMAL HIGH (ref 11.0–15.0)
WBC: 6.9 10*3/uL (ref 3.8–10.8)

## 2017-02-08 LAB — URINALYSIS, ROUTINE W REFLEX MICROSCOPIC
BILIRUBIN URINE: NEGATIVE
KETONES UR: NEGATIVE mg/dL
Leukocytes, UA: NEGATIVE
NITRITE: NEGATIVE
PH: 6 (ref 5.0–8.0)
Protein, ur: >300 mg/dL — AB
Specific Gravity, Urine: 1.02 (ref 1.005–1.030)

## 2017-02-08 LAB — BASIC METABOLIC PANEL
Anion gap: 10 (ref 5–15)
BUN: 20 mg/dL (ref 6–20)
CO2: 23 mmol/L (ref 22–32)
CREATININE: 1.1 mg/dL — AB (ref 0.44–1.00)
Calcium: 8.8 mg/dL — ABNORMAL LOW (ref 8.9–10.3)
Chloride: 101 mmol/L (ref 101–111)
GFR calc Af Amer: >60 mL/min (ref >60)
GFR, EST NON AFRICAN AMERICAN: 53 mL/min — AB (ref >60)
GLUCOSE: 382 mg/dL — AB (ref 65–99)
POTASSIUM: 4.1 mmol/L (ref 3.5–5.1)
Sodium: 134 mmol/L — ABNORMAL LOW (ref 135–145)

## 2017-02-08 LAB — COMPLETE METABOLIC PANEL WITH GFR
ALBUMIN: 3.2 g/dL — AB (ref 3.6–5.1)
ALK PHOS: 296 U/L — AB (ref 33–130)
ALT: 41 U/L — ABNORMAL HIGH (ref 6–29)
AST: 39 U/L — AB (ref 10–35)
BUN: 20 mg/dL (ref 7–25)
CALCIUM: 8.5 mg/dL — AB (ref 8.6–10.4)
CO2: 21 mmol/L (ref 20–31)
Chloride: 100 mmol/L (ref 98–110)
Creat: 1 mg/dL — ABNORMAL HIGH (ref 0.50–0.99)
GFR, EST NON AFRICAN AMERICAN: 61 mL/min (ref >=60)
GFR, Est African American: 71 mL/min (ref >=60)
Glucose, Bld: 401 mg/dL — ABNORMAL HIGH (ref 65–99)
POTASSIUM: 3.8 mmol/L (ref 3.5–5.3)
Sodium: 133 mmol/L — ABNORMAL LOW (ref 135–146)
Total Bilirubin: 0.9 mg/dL (ref 0.2–1.2)
Total Protein: 7 g/dL (ref 6.1–8.1)

## 2017-02-08 LAB — CBC
HEMATOCRIT: 30.7 % — AB (ref 36.0–46.0)
Hemoglobin: 10.1 g/dL — ABNORMAL LOW (ref 12.0–15.0)
MCH: 24.8 pg — ABNORMAL LOW (ref 26.0–34.0)
MCHC: 32.9 g/dL (ref 30.0–36.0)
MCV: 75.4 fL — AB (ref 78.0–100.0)
Platelets: 257 10*3/uL (ref 150–400)
RBC: 4.07 MIL/uL (ref 3.87–5.11)
RDW: 16.3 % — AB (ref 11.5–15.5)
WBC: 6.7 10*3/uL (ref 4.0–10.5)

## 2017-02-08 LAB — PROTIME-INR
INR: 3.22
PROTHROMBIN TIME: 33.7 s — AB (ref 11.4–15.2)

## 2017-02-08 LAB — CBG MONITORING, ED
GLUCOSE-CAPILLARY: 109 mg/dL — AB (ref 65–99)
Glucose-Capillary: 347 mg/dL — ABNORMAL HIGH (ref 65–99)
Glucose-Capillary: 37 mg/dL — CL (ref 65–99)
Glucose-Capillary: 141 mg/dL — ABNORMAL HIGH (ref 65–99)

## 2017-02-08 LAB — URINALYSIS, MICROSCOPIC (REFLEX)

## 2017-02-08 LAB — TROPONIN I
TROPONIN I: 0.04 ng/mL — AB (ref <0.03)
Troponin I: 0.03 ng/mL (ref <0.03)

## 2017-02-08 LAB — BRAIN NATRIURETIC PEPTIDE
B Natriuretic Peptide: 3708.9 pg/mL — ABNORMAL HIGH (ref 0.0–100.0)
BRAIN NATRIURETIC PEPTIDE: 3261 pg/mL — AB (ref <100)

## 2017-02-08 LAB — GLUCOSE, CAPILLARY
GLUCOSE-CAPILLARY: 427 mg/dL — AB (ref 65–99)
GLUCOSE-CAPILLARY: 112 mg/dL — AB (ref 65–99)

## 2017-02-08 MED ORDER — CLONIDINE HCL 0.1 MG PO TABS
0.1000 mg | ORAL_TABLET | Freq: Two times a day (BID) | ORAL | Status: DC
  Administered 2017-02-08 – 2017-02-11 (×6): 0.1 mg via ORAL
  Filled 2017-02-08 (×6): qty 1

## 2017-02-08 MED ORDER — INSULIN ASPART 100 UNIT/ML ~~LOC~~ SOLN
0.0000 [IU] | Freq: Three times a day (TID) | SUBCUTANEOUS | Status: DC
  Administered 2017-02-09 (×2): 7 [IU] via SUBCUTANEOUS
  Administered 2017-02-10 (×2): 4 [IU] via SUBCUTANEOUS

## 2017-02-08 MED ORDER — SODIUM CHLORIDE 0.9% FLUSH: 3.0000 mL | INTRAVENOUS | Status: DC | PRN

## 2017-02-08 MED ORDER — DEXTROSE 50 % IV SOLN
INTRAVENOUS | Status: AC
  Filled 2017-02-08: qty 50

## 2017-02-08 MED ORDER — ATORVASTATIN CALCIUM 40 MG PO TABS
40.0000 mg | ORAL_TABLET | Freq: Every day | ORAL | Status: DC
  Administered 2017-02-08 – 2017-02-10 (×3): 40 mg via ORAL
  Filled 2017-02-08 (×3): qty 1

## 2017-02-08 MED ORDER — POTASSIUM CHLORIDE CRYS ER 20 MEQ PO TBCR
40.0000 meq | EXTENDED_RELEASE_TABLET | Freq: Every day | ORAL | Status: DC
  Administered 2017-02-09 – 2017-02-11 (×3): 40 meq via ORAL
  Filled 2017-02-08 (×3): qty 2

## 2017-02-08 MED ORDER — DEXTROSE 50 % IV SOLN
INTRAVENOUS | Status: AC
  Administered 2017-02-08: 50 mL
  Filled 2017-02-08: qty 50

## 2017-02-08 MED ORDER — INSULIN GLARGINE 100 UNIT/ML ~~LOC~~ SOLN
30.0000 [IU] | Freq: Every day | SUBCUTANEOUS | Status: DC
  Administered 2017-02-09 – 2017-02-10 (×2): 30 [IU] via SUBCUTANEOUS
  Filled 2017-02-08 (×3): qty 0.3

## 2017-02-08 MED ORDER — WARFARIN SODIUM 7.5 MG PO TABS: 7.5000 mg | ORAL_TABLET | Freq: Every day | ORAL | Status: DC

## 2017-02-08 MED ORDER — LOSARTAN POTASSIUM 50 MG PO TABS
50.0000 mg | ORAL_TABLET | Freq: Every day | ORAL | Status: DC
  Administered 2017-02-08: 50 mg via ORAL
  Filled 2017-02-08: qty 1

## 2017-02-08 MED ORDER — FUROSEMIDE 10 MG/ML IJ SOLN
60.0000 mg | Freq: Two times a day (BID) | INTRAMUSCULAR | Status: DC
  Administered 2017-02-08 – 2017-02-09 (×2): 60 mg via INTRAVENOUS
  Filled 2017-02-08 (×4): qty 6

## 2017-02-08 MED ORDER — INSULIN ASPART 100 UNIT/ML ~~LOC~~ SOLN
8.0000 [IU] | Freq: Once | SUBCUTANEOUS | Status: AC
  Administered 2017-02-08: 8 [IU] via SUBCUTANEOUS
  Filled 2017-02-08: qty 1

## 2017-02-08 MED ORDER — ACETAMINOPHEN 325 MG PO TABS: 650.0000 mg | ORAL_TABLET | ORAL | Status: DC | PRN

## 2017-02-08 MED ORDER — ONDANSETRON HCL 4 MG/2ML IJ SOLN: 4.0000 mg | Freq: Four times a day (QID) | INTRAMUSCULAR | Status: DC | PRN

## 2017-02-08 MED ORDER — CARVEDILOL 25 MG PO TABS
25.0000 mg | ORAL_TABLET | Freq: Two times a day (BID) | ORAL | Status: DC
Start: 2017-02-08 — End: 2017-02-11
  Administered 2017-02-08 – 2017-02-11 (×6): 25 mg via ORAL
  Filled 2017-02-08 (×6): qty 1

## 2017-02-08 MED ORDER — SODIUM CHLORIDE 0.9% FLUSH
3.0000 mL | Freq: Two times a day (BID) | INTRAVENOUS | Status: DC
  Administered 2017-02-08 – 2017-02-11 (×6): 3 mL via INTRAVENOUS

## 2017-02-08 MED ORDER — FAMOTIDINE 20 MG PO TABS
20.0000 mg | ORAL_TABLET | Freq: Every day | ORAL | Status: DC
  Administered 2017-02-09 – 2017-02-11 (×3): 20 mg via ORAL
  Filled 2017-02-08 (×3): qty 1

## 2017-02-08 MED ORDER — FUROSEMIDE 10 MG/ML IJ SOLN
60.0000 mg | Freq: Once | INTRAMUSCULAR | Status: AC
  Administered 2017-02-08: 60 mg via INTRAVENOUS
  Filled 2017-02-08: qty 6

## 2017-02-08 MED ORDER — FERROUS SULFATE 325 (65 FE) MG PO TABS
325.0000 mg | ORAL_TABLET | Freq: Every day | ORAL | Status: DC
  Administered 2017-02-09 – 2017-02-11 (×3): 325 mg via ORAL
  Filled 2017-02-08 (×3): qty 1

## 2017-02-08 MED ORDER — SODIUM CHLORIDE 0.9 % IV SOLN: 250.0000 mL | INTRAVENOUS | Status: DC | PRN

## 2017-02-08 MED ORDER — BLOOD GLUCOSE TEST VI STRP: ORAL_STRIP | 0 refills | Status: DC

## 2017-02-08 MED ORDER — TICAGRELOR 90 MG PO TABS
90.0000 mg | ORAL_TABLET | Freq: Two times a day (BID) | ORAL | Status: DC
  Administered 2017-02-08 – 2017-02-11 (×6): 90 mg via ORAL
  Filled 2017-02-08 (×6): qty 1

## 2017-02-08 MED ORDER — INSULIN ASPART 100 UNIT/ML ~~LOC~~ SOLN
0.0000 [IU] | Freq: Every day | SUBCUTANEOUS | Status: DC
  Administered 2017-02-09: 2 [IU] via SUBCUTANEOUS

## 2017-02-08 MED ORDER — WARFARIN - PHARMACIST DOSING INPATIENT
Freq: Every day | Status: DC
  Administered 2017-02-09 – 2017-02-10 (×2): 1

## 2017-02-08 MED ORDER — INJECTION DEVICE FOR INSULIN DEVI
Freq: Once | Status: AC
  Administered 2017-02-08: 11:00:00

## 2017-02-08 NOTE — Telephone Encounter (Signed)
I called over to [name] ED and spoke with [place] regarding patient and why we sent her over. EKG was faxed over as well.

## 2017-02-08 NOTE — ED Provider Notes (Signed)
[facility] DEPT Provider Note   CSN: [id number] Arrival date & time: 02/08/17  1113     History   Chief Complaint Chief Complaint  Patient presents with  . Weakness  . Hyperglycemia    HPI Amber Young is a 61 y.o. female.  HPI Patient presents to the emergency room for evaluation of weakness and shortness of breath. Patient has history of congestive heart failure, diabetes and stroke amongst other medical conditions. Patient went to her primary care doctor today because she's had worsening fatigue, dyspnea and insomnia over the last couple weeks. Patient states when she is lying flat she is getting short of breath and she cannot sleep. She's got chronic leg swelling but has not noticed any significant changes with that. She denies any trouble with any fevers or cough. She has some intermittent heaviness in her chest but no chest pain or discomfort currently. She is also not short of breath sitting up here in the emergency room. Patient states she has been taking her medications although she had not been checking her blood sugars recently. She denies any focal numbness or weakness.  Her doctor sent her to the ED for further evaluation. Past Medical History:  Diagnosis Date  . Anemia   . CHF (congestive heart failure) ([place])   . Coronary artery disease   . GERD (gastroesophageal reflux disease)   . Heart murmur   . Hyperlipidemia   . Hypertension   . Stroke ([place]) 09/2014   numbness left upper lip, finger tips on left hand; "resolved" (05/18/2016)  . Type II diabetes mellitus [facility])     Patient Active Problem List   Diagnosis Date Noted  . Chronic anticoagulation 01/06/2017  . Non compliance w medication regimen 01/06/2017  . Breast abscess 12/20/2016  . Encounter for therapeutic drug monitoring 05/29/2016  . CAD S/P PCI mLAD DES (Promus 3.0 x 24 -- 3.25 mm) 05/19/2016  . PAF (paroxysmal atrial fibrillation) ([place]) 05/19/2016  . Cardiomyopathy, ischemic     Class:  Diagnosis of  . Anemia 04/26/2016  . Acute on chronic combined systolic and diastolic CHF (congestive heart failure) ([place]) 11/14/2015  . Hypokalemia 11/14/2015  . Hypertensive emergency   . Uncontrolled hypertension 09/30/2014  . Uncontrolled diabetes mellitus ([place]) 09/30/2014  . History of stroke 09/30/2014    Past Surgical History:  Procedure Laterality Date  . CARDIAC CATHETERIZATION N/A 05/18/2016   Procedure: Left Heart Cath and Coronary Angiography;  Surgeon: Jettie Booze, MD;  Location: [facility] CV LAB;  Service: Cardiovascular;  Laterality: N/A;  . CARDIAC CATHETERIZATION N/A 05/18/2016   Procedure: Coronary Stent Intervention;  Surgeon: Jettie Booze, MD;  Location: [facility] CV LAB;  Service: Cardiovascular;  Laterality: N/A;  . CORONARY ANGIOPLASTY    . TUBAL LIGATION  1980  . VAGINAL HYSTERECTOMY  1999   "partial; fibroids"    OB History    Gravida Para Term Preterm AB Living   2         2   SAB TAB Ectopic Multiple Live Births                   Home Medications    Prior to Admission medications   Medication Sig Start Date End Date Taking? Authorizing Provider  acetaminophen (TYLENOL) 325 MG tablet Take 650 mg by mouth every 6 (six) hours as needed for mild pain.   Yes [provider]  atorvastatin (LIPITOR) 40 MG tablet Take 1 tablet (40 mg total) by mouth daily  at 6 PM. 01/05/17  Yes Scot Jun, FNP  carvedilol (COREG) 25 MG tablet Take 1 tablet (25 mg total) by mouth 2 (two) times daily. 12/25/16  Yes Patrecia Pour, Christean Grief, MD  cloNIDine (CATAPRES) 0.1 MG tablet Take 1 tablet (0.1 mg total) by mouth 2 (two) times daily. 01/05/17  Yes Scot Jun, FNP  furosemide (LASIX) 40 MG tablet Take 1 tablet (40 mg total) by mouth daily. 01/06/17 04/06/17 Yes Kilroy, Luke K, PA-C  Glucose Blood (BLOOD GLUCOSE TEST STRIPS) STRP Use as directed 02/08/17  Yes Scot Jun, FNP  insulin glargine (LANTUS) 100 UNIT/ML injection Inject 0.3 mLs  (30 Units total) into the skin at bedtime. 01/05/17  Yes Scot Jun, FNP  insulin lispro (HUMALOG) 100 UNIT/ML injection Inject 0.05 mLs (5 Units total) into the skin 3 (three) times daily with meals. Administer for any blood sugar greater than 125 with meals Patient taking differently: Inject 5 Units into the skin 2 (two) times daily. Administer for any blood sugar greater than 125 with meals 01/05/17  Yes Scot Jun, FNP  Insulin Pen Needle 29G X 10MM MISC Use as directed 11/17/15  Yes Kathie Dike, MD  INSULIN SYRINGE .5CC/29G 29G X 1/2" 0.5 ML MISC Use to administer insulin three times daily 01/05/17  Yes Scot Jun, FNP  losartan (COZAAR) 50 MG tablet Take 1 tablet (50 mg total) by mouth daily. 01/18/17  Yes Scot Jun, FNP  potassium chloride SA (K-DUR,KLOR-CON) 20 MEQ tablet Take 2 tablets (40 mEq total) by mouth daily. Patient taking differently: Take 20 mEq by mouth daily.  01/22/17  Yes Scot Jun, FNP  ranitidine (ZANTAC) 150 MG tablet Take 1 tablet (150 mg total) by mouth 2 (two) times daily. 01/18/17  Yes Scot Jun, FNP  ticagrelor (BRILINTA) 90 MG TABS tablet Take 1 tablet (90 mg total) by mouth 2 (two) times daily. 05/20/16  Yes Reino Bellis B, NP  warfarin (COUMADIN) 5 MG tablet Take 1 tablet (5 mg total) by mouth daily at 6 PM. Patient taking differently: Take 7.5 mg by mouth daily at 6 PM.  01/05/17  Yes Scot Jun, FNP  ferrous sulfate (FERROUSUL) 325 (65 FE) MG tablet Take 1 tablet (325 mg total) by mouth daily with breakfast. 01/22/17   Scot Jun, FNP  traMADol (ULTRAM) 50 MG tablet Take 1 tablet (50 mg total) by mouth every 6 (six) hours as needed for severe pain. Patient not taking: Reported on 02/08/2017 12/25/16   Doreatha Lew, MD    Family History Family History  Problem Relation Age of Onset  . Diabetes Mother   . Hypertension Mother   . COPD Mother   . Heart failure Mother   . Hypertension Sister     . Hypertension Brother     Social History Social History  Substance Use Topics  . Smoking status: Never Smoker  . Smokeless tobacco: Never Used  . Alcohol use No     Allergies   Ace inhibitors   Review of Systems Review of Systems  All other systems reviewed and are negative.    Physical Exam Updated Vital Signs BP (!) 182/78 (BP Location: Right Arm)   Pulse (!) 59   Temp 97.9 F (36.6 C) (Oral)   Resp 20   SpO2 100%   Physical Exam  Constitutional: No distress.  HENT:  Head: Normocephalic and atraumatic.  Right Ear: External ear normal.  Left Ear: External ear normal.  Eyes: Conjunctivae are normal. Right eye exhibits no discharge. Left eye exhibits no discharge. No scleral icterus.  Neck: Neck supple. No tracheal deviation present.  Cardiovascular: Normal rate, regular rhythm and intact distal pulses.   Pulmonary/Chest: Effort normal and breath sounds normal. No stridor. No respiratory distress. She has no wheezes. She has no rales.  Abdominal: Soft. Bowel sounds are normal. She exhibits no distension. There is no tenderness. There is no rebound and no guarding.  Musculoskeletal: She exhibits edema ( pitting edema bilateral lower extremities ). She exhibits no tenderness.  Neurological: She is alert. She has normal strength. No cranial nerve deficit (no facial droop, extraocular movements intact, no slurred speech) or sensory deficit. She exhibits normal muscle tone. She displays no seizure activity. Coordination normal.  Skin: Skin is warm and dry. No rash noted. She is not diaphoretic.  Psychiatric: She has a normal mood and affect.  Nursing note and vitals reviewed.    ED Treatments / Results  Labs (all labs ordered are listed, but only abnormal results are displayed) Labs Reviewed  BASIC METABOLIC PANEL - Abnormal; Notable for the following:       Result Value   Sodium 134 (*)    Glucose, Bld 382 (*)    Creatinine, Ser 1.10 (*)    Calcium 8.8 (*)     GFR calc non Af Amer 53 (*)    All other components within normal limits  CBC - Abnormal; Notable for the following:    Hemoglobin 10.1 (*)    HCT 30.7 (*)    MCV 75.4 (*)    MCH 24.8 (*)    RDW 16.3 (*)    All other components within normal limits  URINALYSIS, ROUTINE W REFLEX MICROSCOPIC - Abnormal; Notable for the following:    APPearance HAZY (*)    Glucose, UA >=500 (*)    Hgb urine dipstick LARGE (*)    Protein, ur >300 (*)    All other components within normal limits  BRAIN NATRIURETIC PEPTIDE - Abnormal; Notable for the following:    B Natriuretic Peptide 3,708.9 (*)    All other components within normal limits  TROPONIN I - Abnormal; Notable for the following:    Troponin I 0.03 (*)    All other components within normal limits  URINALYSIS, MICROSCOPIC (REFLEX) - Abnormal; Notable for the following:    Bacteria, UA FEW (*)    Squamous Epithelial / LPF 6-30 (*)    All other components within normal limits  CBG MONITORING, ED - Abnormal; Notable for the following:    Glucose-Capillary 347 (*)    All other components within normal limits    EKG  EKG Interpretation  Date/Time:  Monday February 08 2017 11:16:33 EDT Ventricular Rate:  75 PR Interval:  160 QRS Duration: 80 QT Interval:  432 QTC Calculation: 482 R Axis:   72 Text Interpretation:  Normal sinus rhythm Possible Left atrial enlargement Nonspecific ST and T wave abnormality Prolonged QT Abnormal ECG No significant change since last tracing Confirmed by Dorie Rank [phone]) on 02/08/2017 12:31:55 PM       Radiology Dg Chest 2 View  Result Date: 02/08/2017 CLINICAL DATA:  Shortness of Breath EXAM: CHEST  2 VIEW COMPARISON:  Dec 27, 2016 FINDINGS: There is no edema or consolidation. Heart is mildly enlarged with slight pulmonary venous hypertension. No adenopathy. No bone lesions. IMPRESSION: Evidence a degree of pulmonary vascular congestion without edema or consolidation. No evident adenopathy. Electronically Signed  By: Lowella Grip III M.D.   On: 02/08/2017 13:13    Procedures Procedures (including critical care time)  Medications Ordered in ED Medications  furosemide (LASIX) injection 60 mg (not administered)  insulin aspart (novoLOG) injection 8 Units (8 Units Subcutaneous Given 02/08/17 1252)     Initial Impression / Assessment and Plan / ED Course  I have reviewed the triage vital signs and the nursing notes.  Pertinent labs & imaging results that were available during my care of the patient were reviewed by me and considered in my medical decision making (see chart for details).   patient presents to the emergency room for issues with orthopnea and fatigue. Geryl Councilman has a history of congestive heart failure and diabetes. Blood sugars have been elevated in the 340s but no signs of DKA.  The patient also is hypertensive and has elevated BNP.  Chest x-ray does show vascular congestion but no definite pulmonary edema.  I suspect the patient's symptoms are related to a CHF exacerbation. Considering her hyperglycemia and complex medical conditions I will consult the medical service for admission, diuresis and further treatment.   Final Clinical Impressions(s) / ED Diagnoses   Final diagnoses:  Acute on chronic congestive heart failure, unspecified heart failure type ([place])  Hyperglycemia      Dorie Rank, MD 02/08/17 1513

## 2017-02-08 NOTE — ED Notes (Signed)
Pt transported to xray 

## 2017-02-08 NOTE — Progress Notes (Signed)
ANTICOAGULATION CONSULT NOTE - Initial Consult  Pharmacy Consult:  Coumadin Indication: atrial fibrillation  Allergies  Allergen Reactions  . Ace Inhibitors Cough    Patient Measurements: Height = 66.5 inches Weight = 73 kg  Vital Signs: Temp: 97.9 F (36.6 C) (07/02 1121) Temp Source: Oral (07/02 1121) BP: 171/96 (07/02 1830) Pulse Rate: 62 (07/02 1830)  Labs:  Recent Labs  02/08/17 1018 02/08/17 1131 02/08/17 1843  HGB 9.8* 10.1*  --   HCT 31.1* 30.7*  --   PLT 280 257  --   LABPROT  --   --  33.7*  INR  --   --  3.22  CREATININE 1.00* 1.10*  --   TROPONINI  --  0.03*  --     Estimated Creatinine Clearance: 56.2 mL/min (A) (by C-G formula based on SCr of 1.1 mg/dL (H)).   Medical History: Past Medical History:  Diagnosis Date  . Anemia   . CHF (congestive heart failure) ([place])   . Coronary artery disease   . GERD (gastroesophageal reflux disease)   . Heart murmur   . Hyperlipidemia   . Hypertension   . Stroke ([place]) 09/2014   numbness left upper lip, finger tips on left hand; "resolved" (05/18/2016)  . Type II diabetes mellitus ([place])       Assessment: 63 YOF admitted from PCP office for worsening dyspnea, fatigue and insomnia for 2 weeks.  Pharmacy consulted to manage Coumadin from PTA for history of AFib.  INR supra-therapeutic on admit at 3.22.  AST/ALT also mildly elevated.  No bleeding reported.  Home Coumadin dose: 7.5mg  PO daily   Goal of Therapy:  INR 2-3    Plan:  - Hold Coumadin today - Daily PT / INR   Elier Zellars D. Mina Marble, PharmD, [place] Pager:  [phone] 02/08/2017, 7:20 PM

## 2017-02-08 NOTE — Progress Notes (Signed)
Patient ID: Amber Young, female    DOB: August 15, 1955, 61 y.o.   MRN: [id number]  PCP: Scot Jun, FNP  Chief Complaint  Patient presents with  . Fatigue    2 WEEKS  . LOSS OF APPETITE  . Insomnia    Subjective:  HPI Amber Young is a 61 y.o. female presents for evaluation worsening fatigue, loss of appetite, dyspnea, and insomnia x 2 weeks. Medical problems include: Uncontrolled type 2 diabetes, CHF-EF 40-45%  , hx CVA, chronic lower extremity edema. Amber Young  reports that over the last 2 weeks she has experienced worsening insomnia related to worsening shortness of breath at nighttime. Denies any associated cough or sputum production. Diminished appetite and unintentional weight loss over the last several weeks. She is full after only small amount of food intake. Denies chest pain although reports that she feels a "heaviness sensation of chest".  Reports adherence to medication therapy. She has not recently checked her blood sugar as she reports that she is out of test strips. Reports associated dizziness, especially with standing, denies associated visual disturbances.   Social History   Social History  . Marital status: Married    Spouse name: N/A  . Number of children: N/A  . Years of education: 45   Occupational History  . hairdresser    Social History Main Topics  . Smoking status: Never Smoker  . Smokeless tobacco: Never Used  . Alcohol use No  . Drug use: No  . Sexual activity: Yes   Other Topics Concern  . Not on file   Social History Narrative   Married, 2 children, hairdresser   Caffeine use- occasional tea or coffee   Right handed       Family History  Problem Relation Age of Onset  . Diabetes Mother   . Hypertension Mother   . COPD Mother   . Heart failure Mother   . Hypertension Sister   . Hypertension Brother    Review of Systems See hpi Patient Active Problem List   Diagnosis Date Noted  . Chronic anticoagulation 01/06/2017   . Non compliance w medication regimen 01/06/2017  . Breast abscess 12/20/2016  . Encounter for therapeutic drug monitoring 05/29/2016  . CAD S/P PCI mLAD DES (Promus 3.0 x 24 -- 3.25 mm) 05/19/2016  . PAF (paroxysmal atrial fibrillation) ([place]) 05/19/2016  . Cardiomyopathy, ischemic     Class: Diagnosis of  . Anemia 04/26/2016  . Acute on chronic combined systolic and diastolic CHF (congestive heart failure) ([place]) 11/14/2015  . Hypokalemia 11/14/2015  . Hypertensive emergency   . Uncontrolled hypertension 09/30/2014  . Uncontrolled diabetes mellitus ([place]) 09/30/2014  . History of stroke 09/30/2014    Allergies  Allergen Reactions  . Ace Inhibitors Cough    Prior to Admission medications   Medication Sig Start Date End Date Taking? Authorizing Provider  atorvastatin (LIPITOR) 40 MG tablet Take 1 tablet (40 mg total) by mouth daily at 6 PM. 01/05/17  Yes Scot Jun, FNP  carvedilol (COREG) 25 MG tablet Take 1 tablet (25 mg total) by mouth 2 (two) times daily. 12/25/16  Yes Patrecia Pour, Christean Grief, MD  cloNIDine (CATAPRES) 0.1 MG tablet Take 1 tablet (0.1 mg total) by mouth 2 (two) times daily. 01/05/17  Yes Scot Jun, FNP  ferrous sulfate (FERROUSUL) 325 (65 FE) MG tablet Take 1 tablet (325 mg total) by mouth daily with breakfast. 01/22/17  Yes Scot Jun, FNP  furosemide (LASIX) 40 MG tablet Take 1  tablet (40 mg total) by mouth daily. 01/06/17 04/06/17 Yes Kilroy, Luke K, PA-C  Glucose Blood (BLOOD GLUCOSE TEST STRIPS) STRP Use as directed 11/17/15  Yes Memon, Jolaine Artist, MD  insulin glargine (LANTUS) 100 UNIT/ML injection Inject 0.3 mLs (30 Units total) into the skin at bedtime. 01/05/17  Yes Scot Jun, FNP  insulin lispro (HUMALOG) 100 UNIT/ML injection Inject 0.05 mLs (5 Units total) into the skin 3 (three) times daily with meals. Administer for any blood sugar greater than 125 with meals 01/05/17  Yes Scot Jun, FNP  Insulin Pen Needle 29G X 10MM MISC Use  as directed 11/17/15  Yes Kathie Dike, MD  INSULIN SYRINGE .5CC/29G 29G X 1/2" 0.5 ML MISC Use to administer insulin three times daily 01/05/17  Yes Scot Jun, FNP  losartan (COZAAR) 50 MG tablet Take 1 tablet (50 mg total) by mouth daily. 01/18/17  Yes Scot Jun, FNP  potassium chloride SA (K-DUR,KLOR-CON) 20 MEQ tablet Take 2 tablets (40 mEq total) by mouth daily. 01/22/17  Yes Scot Jun, FNP  ranitidine (ZANTAC) 150 MG tablet Take 1 tablet (150 mg total) by mouth 2 (two) times daily. 01/18/17  Yes Scot Jun, FNP  ticagrelor (BRILINTA) 90 MG TABS tablet Take 1 tablet (90 mg total) by mouth 2 (two) times daily. 05/20/16  Yes Reino Bellis B, NP  warfarin (COUMADIN) 5 MG tablet Take 1 tablet (5 mg total) by mouth daily at 6 PM. 01/05/17  Yes Scot Jun, FNP  losartan (COZAAR) 50 MG tablet Take 50 mg by mouth 2 (two) times daily.    [provider]  traMADol (ULTRAM) 50 MG tablet Take 1 tablet (50 mg total) by mouth every 6 (six) hours as needed for severe pain. Patient not taking: Reported on 02/08/2017 12/25/16   Doreatha Lew, MD    Past Medical, Surgical Family and Social History reviewed and updated.    Objective:   Today's Vitals   02/08/17 0914 02/08/17 0928  BP: (!) 200/120 (!) 190/110  Pulse: 97   Resp: 14   Temp: 98 F (36.7 C)   TempSrc: Oral   SpO2: 99%   Weight: 161 lb (73 kg)   Height: 5' 6.5" (1.689 m)     Wt Readings from Last 3 Encounters:  02/08/17 161 lb (73 kg)  01/18/17 167 lb (75.8 kg)  01/13/17 171 lb (77.6 kg)    Physical Exam  Constitutional: She is oriented to person, place, and time. She has a sickly appearance.  HENT:  Head: Normocephalic and atraumatic.  Eyes: Conjunctivae and EOM are normal. Pupils are equal, round, and reactive to light.  Neck: Normal range of motion. No tracheal deviation present. No thyromegaly present.  Cardiovascular: Regular rhythm, normal heart sounds and intact  distal pulses.   Pulses:      Carotid pulses are on the left side with bruit. EKG: NSR, left atrial enlargement, septal infarct V2: slightly elevated ST segment  Pulmonary/Chest: Effort normal. She has decreased breath sounds in the right upper field, the right middle field, the right lower field, the left upper field, the left middle field and the left lower field.  Musculoskeletal: She exhibits edema.  Lymphadenopathy:    She has no cervical adenopathy.  Neurological: She is alert and oriented to person, place, and time.  Slowed gait. Balance intact.  Skin: Skin is warm and dry.  Psychiatric: She has a normal mood and affect. Her behavior is normal. Judgment and thought content  normal.    Assessment & Plan:  1. Chronic combined systolic and diastolic heart failure (HCC) - COMPLETE METABOLIC PANEL WITH GFR - Brain natriuretic peptide - CBC with Differential - EKG 12-Lead-abnormal   2. Hyperglycemia-BG 427 - injection device for insulin; 10 units Humalog administered in office subcutaneously   3. Hypertension Urgency ,  -Clonidine 0.2 mg once time dose in office.  Patient has been deferred to [facility] emergency department for further workup and evaluation. Concern for worsening heart failure as she reports dyspnea and her legs are positive for pitting edema. Patient reports consistently taking medications as prescribed however blood pressure remains unstable. Report was called to the charge nurse  Mali RN at [facility].  RTC: 2 weeks chronic disease management.  Amber Sage. [name], MSN, FNP-C [facility]  [name] [place], [place] [address] [phone]

## 2017-02-08 NOTE — ED Notes (Signed)
Pt more drowsy than normal. CBG checked and read 37, hospitalist paged and EDP aware and verbally ordered D50, D50 given.

## 2017-02-08 NOTE — H&P (Signed)
History and Physical:    Amber Young   WLN:[id number] DOB: [birth year] DOA: 02/08/2017  Referring MD/provider: Dorie Rank, MD PCP: Scot Jun, FNP   Patient coming from: Home  Chief Complaint: Weakness, fatigue, palpitations, PND  History of Present Illness:   Amber Young is an 61 y.o. female with a PMH of ischemic cardiomyopathy/CHF, EF 40-45 percent on Echo done 04/2016, CAD status post stent to the mid LAD 05/2016 with dual antiplatelets recommended for 1 year, diabetes, hypertension, and PAF who presented to the ED for evaluation of a two-week history of weakness which she describes as feeling "washed out ", tachycardia and PND at night, hyperglycemia associated with polydipsia and polyuria, as well as lower extremity swelling/edema. The patient states that she has been compliant with taking 40 mg of Lasix daily. She [facility] her PCP at the health department today, and was instructed to come to the ED for further evaluation. The patient denies any dietary indiscretions or excessive salt intake.  ED Course:  Chest x-ray showed central vascular congestion and labs were notable for significant hyperglycemia. The patient was given 60 mg of IV Lasix and 8 units of insulin and referred for admission.  ROS:   Review of Systems  Constitutional: Positive for malaise/fatigue and weight loss. Negative for chills and fever.  HENT: Negative.   Eyes: Negative.   Respiratory: Positive for shortness of breath. Negative for cough.   Cardiovascular: Positive for palpitations, orthopnea, leg swelling and PND. Negative for chest pain.  Gastrointestinal: Positive for nausea and vomiting. Negative for blood in stool and melena.  Genitourinary: Negative.   Musculoskeletal: Negative.   Skin: Negative.   Neurological: Positive for dizziness and weakness.  Endo/Heme/Allergies: Positive for polydipsia.  Psychiatric/Behavioral: Negative.     Past Medical History:   Past Medical History:    Diagnosis Date  . Anemia   . CHF (congestive heart failure) ([place])   . Coronary artery disease   . GERD (gastroesophageal reflux disease)   . Heart murmur   . Hyperlipidemia   . Hypertension   . Stroke ([place]) 09/2014   numbness left upper lip, finger tips on left hand; "resolved" (05/18/2016)  . Type II diabetes mellitus ([place])     Past Surgical History:   Past Surgical History:  Procedure Laterality Date  . CARDIAC CATHETERIZATION N/A 05/18/2016   Procedure: Left Heart Cath and Coronary Angiography;  Surgeon: Jettie Booze, MD;  Location: [place] [facility];  Service: Cardiovascular;  Laterality: N/A;  . CARDIAC CATHETERIZATION N/A 05/18/2016   Procedure: Coronary Stent Intervention;  Surgeon: Jettie Booze, MD;  Location: [place] [facility];  Service: Cardiovascular;  Laterality: N/A;  . CORONARY ANGIOPLASTY    . TUBAL LIGATION  1980  . VAGINAL HYSTERECTOMY  1999   "partial; fibroids"    Social History:   Social History   Social History  . Marital status: Married    Spouse name: N/A  . Number of children: N/A  . Years of education: 28   Occupational History  . hairdresser    Social History Main Topics  . Smoking status: Never Smoker  . Smokeless tobacco: Never Used  . Alcohol use No  . Drug use: No  . Sexual activity: Yes   Other Topics Concern  . Not on file   Social History Narrative   Married, 2 children, hairdresser   Caffeine use- occasional tea or coffee   Right handed       Allergies  Ace inhibitors  Family history:   Family History  Problem Relation Age of Onset  . Diabetes Mother   . Hypertension Mother   . COPD Mother   . Heart failure Mother   . Hypertension Sister   . Hypertension Brother     Current Medications:   Prior to Admission medications   Medication Sig Start Date End Date Taking? Authorizing Provider  acetaminophen (TYLENOL) 325 MG tablet Take 650 mg by mouth every 6 (six) hours as needed for mild pain.   Yes  [provider]  atorvastatin (LIPITOR) 40 MG tablet Take 1 tablet (40 mg total) by mouth daily at 6 PM. 01/05/17  Yes Scot Jun, FNP  carvedilol (COREG) 25 MG tablet Take 1 tablet (25 mg total) by mouth 2 (two) times daily. 12/25/16  Yes Patrecia Pour, Christean Grief, MD  cloNIDine (CATAPRES) 0.1 MG tablet Take 1 tablet (0.1 mg total) by mouth 2 (two) times daily. 01/05/17  Yes Scot Jun, FNP  furosemide (LASIX) 40 MG tablet Take 1 tablet (40 mg total) by mouth daily. 01/06/17 04/06/17 Yes Kilroy, Luke K, PA-C  Glucose Blood (BLOOD GLUCOSE TEST STRIPS) STRP Use as directed 02/08/17  Yes Scot Jun, FNP  insulin glargine (LANTUS) 100 UNIT/ML injection Inject 0.3 mLs (30 Units total) into the skin at bedtime. Patient taking differently: Inject 20 Units into the skin at bedtime.  01/05/17  Yes Scot Jun, FNP  insulin lispro (HUMALOG) 100 UNIT/ML injection Inject 0.05 mLs (5 Units total) into the skin 3 (three) times daily with meals. Administer for any blood sugar greater than 125 with meals Patient taking differently: Inject 5 Units into the skin 2 (two) times daily. Administer for any blood sugar greater than 125 with meals 01/05/17  Yes Scot Jun, FNP  Insulin Pen Needle 29G X 10MM MISC Use as directed 11/17/15  Yes Kathie Dike, MD  INSULIN SYRINGE .5CC/29G 29G X 1/2" 0.5 ML MISC Use to administer insulin three times daily 01/05/17  Yes Scot Jun, FNP  losartan (COZAAR) 50 MG tablet Take 1 tablet (50 mg total) by mouth daily. 01/18/17  Yes Scot Jun, FNP  potassium chloride SA (K-DUR,KLOR-CON) 20 MEQ tablet Take 2 tablets (40 mEq total) by mouth daily. Patient taking differently: Take 20 mEq by mouth daily.  01/22/17  Yes Scot Jun, FNP  ranitidine (ZANTAC) 150 MG tablet Take 1 tablet (150 mg total) by mouth 2 (two) times daily. 01/18/17  Yes Scot Jun, FNP  ticagrelor (BRILINTA) 90 MG TABS tablet Take 1 tablet (90 mg total) by  mouth 2 (two) times daily. 05/20/16  Yes Reino Bellis B, NP  warfarin (COUMADIN) 5 MG tablet Take 1 tablet (5 mg total) by mouth daily at 6 PM. Patient taking differently: Take 7.5 mg by mouth daily at 6 PM.  01/05/17  Yes Scot Jun, FNP  ferrous sulfate (FERROUSUL) 325 (65 FE) MG tablet Take 1 tablet (325 mg total) by mouth daily with breakfast. 01/22/17   Scot Jun, FNP  traMADol (ULTRAM) 50 MG tablet Take 1 tablet (50 mg total) by mouth every 6 (six) hours as needed for severe pain. Patient not taking: Reported on 02/08/2017 12/25/16   Doreatha Lew, MD    Physical Exam:   Vitals:   02/08/17 1345 02/08/17 1430 02/08/17 1500 02/08/17 1600  BP: (!) 185/95 (!) 182/78 (!) 178/93 (!) 167/94  Pulse: 66 (!) 59 64 74  Resp: (!) 26 20 (!)  30 (!) 29  Temp:      TempSrc:      SpO2: 100% 100%  98%     Physical Exam: Blood pressure (!) 167/94, pulse 74, temperature 97.9 F (36.6 C), temperature source Oral, resp. rate (!) 29, SpO2 98 %. Gen: No acute distress. Head: Normocephalic, atraumatic. Eyes: Pupils equal, round and reactive to light. Extraocular movements intact.  Sclerae nonicteric. No lid lag. Mouth: Oropharynx reveals Moist mucous membranes. Dentition is fair. Neck: Supple, no thyromegaly, no lymphadenopathy, no jugular venous distention. Chest: Lungs are clear to auscultation with good air movement. No rales, rhonchi or wheezes.  CV: Heart sounds are regular with an S1, S2. 3/6 murmur, no rubs, clicks, or gallops.  Abdomen: Soft, nontender, nondistended with normal active bowel sounds. No hepatosplenomegaly or palpable masses. Extremities: Extremities are without clubbing, or cyanosis. 2+ pitting edema. Pedal pulses 2+.  Skin: Warm and dry. No rashes, lesions or wounds. Neuro: Alert and oriented times 3; grossly nonfocal.  Psych: Insight is good and judgment is appropriate. Mood and affect depressed/flat.   Data Review:    Labs: Reviewed and are  significant for mild hyponatremia (134), significant hyperglycemia (382), creatinine 1.10 and calcium 8.8. Mild transaminase/alkaline phosphatase elevation, albumin 3.2, normal WBC, hemoglobin 10.1, troponin 0.03.   Radiographic Studies: Pulmonary vascular congestion but no frank edema or consolidation.    EKG: Independently reviewed. Normal sinus rhythm with nonspecific ST-T wave abnormalities and a QTC of 482. No significant changes from prior.   Assessment/Plan:   Principal Problem:   Acute on chronic combined systolic and diastolic CHF (congestive heart failure) (HCC)/Ischemic cardiomyopathy We'll place on Lasix, 60 mg IV every 12 hours and monitor I/O and daily weights. Had a 2-D echo last year, so would not repeat unless she has a significant troponin bump. Cycle cardiac markers. Has a known history of medical nonadherence, so suspect that the patient is not taking her medications exactly as prescribed.  Active Problems:   Uncontrolled hypertension Continue Coreg, Cozaar, Lasix, clonidine.    Uncontrolled diabetes mellitus ([place]) Hemoglobin A1c was 15.4 on 12/20/16. We'll place on 30 units of Lantus daily and insulin resistant SSI.    History of stroke Continue risk factor modification.    CAD S/P PCI mLAD DES (Promus 3.0 x 24 -- 3.25 mm) Denies chest pain. Cycle cardiac markers. Continue Brilinta, beta blocker, Coumadin.    PAF (paroxysmal atrial fibrillation) ([place]) Continue Coumadin per pharmacy.    Microcytic anemia Continue iron supplementation.    GERD Continue Pepcid.    Other information:   DVT prophylaxis: Coumadin ordered. Code Status: Full code. Family Communication: No family at the bedside.  Disposition Plan: Home. Consults called: None. Admission status: Observation.  The medical decision making on this patient was of high complexity and the patient is at high risk for clinical deterioration, therefore this is a level 3  visit.   Wesam Gearhart [facility] Pager [phone] Cell: [phone]   If 7PM-7AM, please contact night-coverage [email or web address] Password [facility] 02/08/2017, 6:14 PM

## 2017-02-08 NOTE — ED Notes (Signed)
Wheeled Pt to RR and back to room.

## 2017-02-08 NOTE — Patient Instructions (Signed)
I am deferring you to [facility] Emergency Department for further work-up and evaluation of your symptoms. Your EKG was abnormal. Combined with your symptoms of nausea, shortness of breath, and worsening fatigue, a higher level of care is warranted.    Heart Failure Heart failure means your heart has trouble pumping blood. This makes it hard for your body to work well. Heart failure is usually a long-term (chronic) condition. You must take good care of yourself and follow your doctor's treatment plan. Follow these instructions at home:  Take your heart medicine as told by your doctor. ? Do not stop taking medicine unless your doctor tells you to. ? Do not skip any dose of medicine. ? Refill your medicines before they run out. ? Take other medicines only as told by your doctor or pharmacist.  Stay active if told by your doctor. The elderly and people with severe heart failure should talk with a doctor about physical activity.  Eat heart-healthy foods. Choose foods that are without trans fat and are low in saturated fat, cholesterol, and salt (sodium). This includes fresh or frozen fruits and vegetables, fish, lean meats, fat-free or low-fat dairy foods, whole grains, and high-fiber foods. Lentils and dried peas and beans (legumes) are also good choices.  Limit salt if told by your doctor.  Cook in a healthy way. Roast, grill, broil, bake, poach, steam, or stir-fry foods.  Limit fluids as told by your doctor.  Weigh yourself every morning. Do this after you pee (urinate) and before you eat breakfast. Write down your weight to give to your doctor.  Take your blood pressure and write it down if your doctor tells you to.  Ask your doctor how to check your pulse. Check your pulse as told.  Lose weight if told by your doctor.  Stop smoking or chewing tobacco. Do not use gum or patches that help you quit without your doctor's approval.  Schedule and go to doctor visits as  told.  Nonpregnant women should have no more than 1 drink a day. Men should have no more than 2 drinks a day. Talk to your doctor about drinking alcohol.  Stop illegal drug use.  Stay current with shots (immunizations).  Manage your health conditions as told by your doctor.  Learn to manage your stress.  Rest when you are tired.  If it is really hot outside: ? Avoid intense activities. ? Use air conditioning or fans, or get in a cooler place. ? Avoid caffeine and alcohol. ? Wear loose-fitting, lightweight, and light-colored clothing.  If it is really cold outside: ? Avoid intense activities. ? Layer your clothing. ? Wear mittens or gloves, a hat, and a scarf when going outside. ? Avoid alcohol.  Learn about heart failure and get support as needed.  Get help to maintain or improve your quality of life and your ability to care for yourself as needed. Contact a doctor if:  You gain weight quickly.  You are more short of breath than usual.  You cannot do your normal activities.  You tire easily.  You cough more than normal, especially with activity.  You have any or more puffiness (swelling) in areas such as your hands, feet, ankles, or belly (abdomen).  You cannot sleep because it is hard to breathe.  You feel like your heart is beating fast (palpitations).  You get dizzy or light-headed when you stand up. Get help right away if:  You have trouble breathing.  There is a change  in mental status, such as becoming less alert or not being able to focus.  You have chest pain or discomfort.  You faint. This information is not intended to replace advice given to you by your health care provider. Make sure you discuss any questions you have with your health care provider. Document Released: 05/05/2008 Document Revised: 01/02/2016 Document Reviewed: 09/12/2012 Elsevier Interactive Patient Education  2017 [employer].

## 2017-02-08 NOTE — ED Triage Notes (Signed)
Pt arrives POV from pcp. PT was sent over for evaluation of HTN, weakness, hyperglycemia, and Intermittent SOB. Pt is very lethargic in triage and nods off during sentences. Pt reports she hasn't been sleeping. Denies chest pain

## 2017-02-09 DIAGNOSIS — E0865 Diabetes mellitus due to underlying condition with hyperglycemia: Secondary | ICD-10-CM

## 2017-02-09 DIAGNOSIS — E876 Hypokalemia: Secondary | ICD-10-CM

## 2017-02-09 LAB — GLUCOSE, CAPILLARY
GLUCOSE-CAPILLARY: 120 mg/dL — AB (ref 65–99)
GLUCOSE-CAPILLARY: 275 mg/dL — AB (ref 65–99)
GLUCOSE-CAPILLARY: 224 mg/dL — AB (ref 65–99)
Glucose-Capillary: 203 mg/dL — ABNORMAL HIGH (ref 65–99)
Glucose-Capillary: 232 mg/dL — ABNORMAL HIGH (ref 65–99)

## 2017-02-09 LAB — BASIC METABOLIC PANEL
Anion gap: 11 (ref 5–15)
BUN: 18 mg/dL (ref 6–20)
CHLORIDE: 102 mmol/L (ref 101–111)
CO2: 26 mmol/L (ref 22–32)
CREATININE: 1.14 mg/dL — AB (ref 0.44–1.00)
Calcium: 8.5 mg/dL — ABNORMAL LOW (ref 8.9–10.3)
GFR, EST AFRICAN AMERICAN: 59 mL/min — AB (ref >60)
GFR, EST NON AFRICAN AMERICAN: 51 mL/min — AB (ref >60)
Glucose, Bld: 127 mg/dL — ABNORMAL HIGH (ref 65–99)
POTASSIUM: 3 mmol/L — AB (ref 3.5–5.1)
SODIUM: 139 mmol/L (ref 135–145)

## 2017-02-09 LAB — PROTIME-INR
INR: 2.63
PROTHROMBIN TIME: 28.6 s — AB (ref 11.4–15.2)

## 2017-02-09 LAB — TROPONIN I
Troponin I: 0.03 ng/mL (ref <0.03)
Troponin I: 0.04 ng/mL (ref <0.03)

## 2017-02-09 MED ORDER — ENSURE ENLIVE PO LIQD: 237.0000 mL | Freq: Two times a day (BID) | ORAL | Status: DC

## 2017-02-09 MED ORDER — NYSTATIN 100000 UNIT/GM EX POWD
Freq: Two times a day (BID) | CUTANEOUS | Status: DC
  Administered 2017-02-09 – 2017-02-10 (×4): via TOPICAL
  Administered 2017-02-11: 1 via TOPICAL
  Filled 2017-02-09: qty 15

## 2017-02-09 MED ORDER — GLUCERNA SHAKE PO LIQD
237.0000 mL | Freq: Three times a day (TID) | ORAL | Status: DC
  Administered 2017-02-09: 237 mL via ORAL

## 2017-02-09 MED ORDER — ENSURE ENLIVE PO LIQD
237.0000 mL | Freq: Two times a day (BID) | ORAL | Status: DC
  Administered 2017-02-09: 237 mL via ORAL

## 2017-02-09 MED ORDER — FUROSEMIDE 10 MG/ML IJ SOLN
60.0000 mg | Freq: Two times a day (BID) | INTRAMUSCULAR | Status: DC
  Administered 2017-02-09: 60 mg via INTRAVENOUS
  Filled 2017-02-09 (×2): qty 6

## 2017-02-09 MED ORDER — WARFARIN SODIUM 5 MG PO TABS
5.0000 mg | ORAL_TABLET | Freq: Once | ORAL | Status: AC
  Administered 2017-02-09: 5 mg via ORAL
  Filled 2017-02-09: qty 1

## 2017-02-09 MED ORDER — BOOST PO LIQD
237.0000 mL | Freq: Two times a day (BID) | ORAL | Status: DC
  Filled 2017-02-09 (×3): qty 237

## 2017-02-09 MED ORDER — GLUCERNA SHAKE PO LIQD: 237.0000 mL | Freq: Two times a day (BID) | ORAL | Status: DC

## 2017-02-09 MED ORDER — HYDRALAZINE HCL 20 MG/ML IJ SOLN: 10.0000 mg | Freq: Four times a day (QID) | INTRAMUSCULAR | Status: DC | PRN

## 2017-02-09 MED ORDER — LOSARTAN POTASSIUM 50 MG PO TABS
100.0000 mg | ORAL_TABLET | Freq: Every day | ORAL | Status: DC
  Administered 2017-02-09 – 2017-02-11 (×3): 100 mg via ORAL
  Filled 2017-02-09 (×3): qty 2

## 2017-02-09 MED ORDER — POTASSIUM CHLORIDE CRYS ER 20 MEQ PO TBCR
40.0000 meq | EXTENDED_RELEASE_TABLET | Freq: Once | ORAL | Status: AC
  Administered 2017-02-09: 40 meq via ORAL
  Filled 2017-02-09: qty 2

## 2017-02-09 NOTE — Progress Notes (Signed)
ANTICOAGULATION CONSULT NOTE  Pharmacy Consult:  Coumadin Indication: atrial fibrillation  Allergies  Allergen Reactions  . Ace Inhibitors Cough    Patient Measurements: Height = 66.5 inches Weight = 73 kg  Vital Signs: Temp: 98 F (36.7 C) (07/03 0520) Temp Source: Oral (07/03 0520) BP: 177/67 (07/03 0520) Pulse Rate: 59 (07/03 0520)  Labs:  Recent Labs  02/08/17 1018  02/08/17 1131 02/08/17 1843 02/09/17 0054 02/09/17 0713  HGB 9.8*  --  10.1*  --   --   --   HCT 31.1*  --  30.7*  --   --   --   PLT 280  --  257  --   --   --   LABPROT  --   --   --  33.7*  --  28.6*  INR  --   --   --  3.22  --  2.63  CREATININE 1.00*  --  1.10*  --   --  1.14*  TROPONINI  --   < > 0.03* 0.04* 0.03* 0.04*  < > = values in this interval not displayed.  Estimated Creatinine Clearance: 49.1 mL/min (A) (by C-G formula based on SCr of 1.14 mg/dL (H)).   Medical History: Past Medical History:  Diagnosis Date  . Anemia   . CHF (congestive heart failure) ([place])   . Coronary artery disease   . GERD (gastroesophageal reflux disease)   . Heart murmur   . Hyperlipidemia   . Hypertension   . Stroke ([place]) 09/2014   numbness left upper lip, finger tips on left hand; "resolved" (05/18/2016)  . Type II diabetes mellitus ([place])     Assessment: 76 YOF admitted from PCP office for worsening dyspnea, fatigue and insomnia for 2 weeks; on warfarin for afib.  INR therapeutic after holding dose yesterday. CBC stable AST/ALT mildly elevated; however, no repeat labs.  No bleeding reported. Give lower dose tonight 2/2 precipitous fall.   PTA Coumadin dose: 7.5mg  PO daily  Goal of Therapy:  INR 2-3   Plan:  - Give warfarin 5mg  x1 today - Daily PT / INR  Dierdre Harness, BS, PharmD Clinical Pharmacy Resident [phone] (Pager) 02/09/2017 8:43 AM

## 2017-02-09 NOTE — Progress Notes (Signed)
PROGRESS NOTE                                                                                                                                                                                                             Patient Demographics:    Amber Young, is a 61 y.o. female, DOB - 1955/11/15, EZM:[id number]  Admit date - 02/08/2017   Admitting Physician Venetia Maxon Rama, MD  Outpatient Primary MD for the patient is Scot Jun, FNP  LOS - 0  Outpatient Specialists:None  Chief Complaint  Patient presents with  . Weakness  . Hyperglycemia       Brief Narrative   61 year old female with ischemic cardiomyopathy with EF of 40-45% (per echo in 04/2016), CAD status post stent to the mid LAD on 05/2016 on DOAC for 1 year, uncontrolled hypertension, paroxysmal A. fib, diabetes mellitus presented to the ED with 2 week history of weakness, dyspnea on exertion and PND. Also reported hyperglycemia with polydipsia and polyuria along with increasing lower extremity swelling. Patient reported being compliant with her daily Lasix dose. She [facility] and was told to come to the ED for further evaluation.  In the ED she was found to have significant hyperglycemia and chest x-ray showing vascular congestion. Patient given 60 mg IV Lasix and 8 units of insulin and admitted to telemetry for acute on chronic systolic CHF.  Subjective:   Feels her breathing to be better since admission. Denies chest pain.   Assessment  & Plan :    Principal Problem:   Acute on chronic combined systolic and diastolic CHF (congestive heart failure) ([place]) Suspect in the setting of dietary and medication no definite evidence. On IV Lasix 40 mg every 12 hours. Monitor strict I/O and daily weight. Has mild troponin elevation which is possibly demand ischemia with acute CHF. Replenish low potassium.  Active Problems:  Uncontrolled hypertension Again suspect dietary nonadherence. Resumed her Coreg and clonidine. Increase losartan dose. Continue Lasix. I will add when necessary hydralazine. Ounce alone dietary adherence and medication compliance.     Uncontrolled diabetes mellitus with hyperglycemia (HCC) A1c of 15.4 last A1c of 15.4 (from 12/20/2016)  Placed on Lantus 30 units at bedtime and resistant sliding scale coverage. Had low CBG last evening but is currently stable. Diabetes coordinator to follow.  History of stroke/paroxysmal A. fib Continue Coumadin. Dosing per pharmacy.  Last LDL of 95.      CAD S/P PCI mLAD DES (Promus 3.0 x 24 -- 3.25 mm) Continue Brilinta and BB.  Small ruptured cyst in right anterior chest Has some yeast he ordered. Nontender. Will order nystatin powder.   Code Status : Full code  Family Communication  : None at bedside  Disposition Plan  : Home once improved, possibly in the next 40-72 hours  Barriers For Discharge : Active symptoms  Consults  : None  Procedures  : None  DVT Prophylaxis  :  Coumadin  Lab Results  Component Value Date   PLT 257 02/08/2017    Antibiotics  :   Anti-infectives    None        Objective:   Vitals:   02/08/17 2234 02/09/17 0120 02/09/17 0520 02/09/17 0900  BP: (!) 187/100 (!) 159/60 (!) 177/67 (!) 181/69  Pulse: 60 (!) 52 (!) 59 63  Resp:  18 20 20   Temp:  98.6 F (37 C) 98 F (36.7 C) 97.4 F (36.3 C)  TempSrc:   Oral Oral  SpO2:  96% 100% 100%  Weight:   69.2 kg (152 lb 8 oz)   Height:   5\' 6"  (1.676 m)     Wt Readings from Last 3 Encounters:  02/09/17 69.2 kg (152 lb 8 oz)  02/08/17 73 kg (161 lb)  01/18/17 75.8 kg (167 lb)     Intake/Output Summary (Last 24 hours) at 02/09/17 1131 Last data filed at 02/09/17 0942  Gross per 24 hour  Intake              720 ml  Output             2515 ml  Net            -1795 ml     Physical Exam  Gen: Middle aged female not in distress HEENT: no pallor,  moist mucosa, supple neck, no JVD Chest: Fine bibasilar crackles, no rhonchi or wheeze, small ruptured cyst underneath right breast,non tender CVS: N S1&S2, no murmurs, rubs or gallop GI: soft, NT, ND,  Musculoskeletal: warm,1+ pitting edema bilaterally     Data Review:    CBC  Recent Labs Lab 02/08/17 1018 02/08/17 1131  WBC 6.9 6.7  HGB 9.8* 10.1*  HCT 31.1* 30.7*  PLT 280 257  MCV 77.0* 75.4*  MCH 24.3* 24.8*  MCHC 31.5* 32.9  RDW 16.7* 16.3*  LYMPHSABS 1,656  --   MONOABS 552  --   EOSABS 0*  --   BASOSABS 0  --     Chemistries   Recent Labs Lab 02/08/17 1018 02/08/17 1131 02/09/17 0713  NA 133* 134* 139  K 3.8 4.1 3.0*  CL 100 101 102  CO2 21 23 26   GLUCOSE 401* 382* 127*  BUN 20 20 18   CREATININE 1.00* 1.10* 1.14*  CALCIUM 8.5* 8.8* 8.5*  AST 39*  --   --   ALT 41*  --   --   ALKPHOS 296*  --   --   BILITOT 0.9  --   --    ------------------------------------------------------------------------------------------------------------------ No results for input(s): CHOL, HDL, LDLCALC, TRIG, CHOLHDL, LDLDIRECT in the last 72 hours.  Lab Results  Component Value Date   HGBA1C 15.4 (H) 12/20/2016   ------------------------------------------------------------------------------------------------------------------ No results for input(s): TSH, T4TOTAL, T3FREE, THYROIDAB in the last 72 hours.  Invalid input(s): FREET3 ------------------------------------------------------------------------------------------------------------------ No results  for input(s): VITAMINB12, FOLATE, FERRITIN, TIBC, IRON, RETICCTPCT in the last 72 hours.  Coagulation profile  Recent Labs Lab 02/08/17 1843 02/09/17 0713  INR 3.22 2.63    No results for input(s): DDIMER in the last 72 hours.  Cardiac Enzymes  Recent Labs Lab 02/08/17 1843 02/09/17 0054 02/09/17 0713  TROPONINI 0.04* 0.03* 0.04*    ------------------------------------------------------------------------------------------------------------------    Component Value Date/Time   BNP 3,708.9 (H) 02/08/2017 1131   BNP 3,261 (H) 02/08/2017 1018    Inpatient Medications  Scheduled Meds: . atorvastatin  40 mg Oral q1800  . carvedilol  25 mg Oral BID  . cloNIDine  0.1 mg Oral BID  . famotidine  20 mg Oral Daily  . ferrous sulfate  325 mg Oral Q breakfast  . furosemide  60 mg Intravenous Q12H  . insulin aspart  0-20 Units Subcutaneous TID WC  . insulin aspart  0-5 Units Subcutaneous QHS  . insulin glargine  30 Units Subcutaneous QHS  . losartan  100 mg Oral Daily  . nystatin   Topical BID  . potassium chloride SA  40 mEq Oral Daily  . sodium chloride flush  3 mL Intravenous Q12H  . ticagrelor  90 mg Oral BID  . warfarin  5 mg Oral ONCE-1800  . Warfarin - Pharmacist Dosing Inpatient   Does not apply q1800   Continuous Infusions: . sodium chloride     PRN Meds:.sodium chloride, acetaminophen, ondansetron (ZOFRAN) IV, sodium chloride flush  Micro Results No results found for this or any previous visit (from the past 240 hour(s)).  Radiology Reports Dg Chest 2 View  Result Date: 02/08/2017 CLINICAL DATA:  Shortness of Breath EXAM: CHEST  2 VIEW COMPARISON:  Dec 27, 2016 FINDINGS: There is no edema or consolidation. Heart is mildly enlarged with slight pulmonary venous hypertension. No adenopathy. No bone lesions. IMPRESSION: Evidence a degree of pulmonary vascular congestion without edema or consolidation. No evident adenopathy. Electronically Signed   By: Lowella Grip III M.D.   On: 02/08/2017 13:13    Time Spent in minutes  35   Louellen Molder M.D on 02/09/2017 at 11:31 AM  Between 7am to 7pm - Pager - [phone]  After 7pm go to [email or web address] - password [facility]  [facility] -  Office  [phone]

## 2017-02-09 NOTE — Progress Notes (Signed)
[facility]  Patient Status: Active (receiving services up to time of hospitalization)  [facility] is providing the following services: RN  If patient discharges after hours, please call [phone].   Amber Young 02/09/2017, 11:28 AM

## 2017-02-09 NOTE — Progress Notes (Signed)
Initial Nutrition Assessment  DOCUMENTATION CODES:   Non-severe (moderate) malnutrition in context of acute illness/injury  INTERVENTION:  Glucerna Shake po BID, each supplement provides 220 kcal and 10 grams of protein  Provided carbohydrate counting education. See in note below.   NUTRITION DIAGNOSIS:   Malnutrition (Moderate) related to acute illness (uncontrolled diabetes) as evidenced by energy intake < 75% for > 7 days, mild depletion of muscle mass, moderate to severe fluid accumulation.   GOAL:   Patient will meet greater than or equal to 90% of their needs   MONITOR:   PO intake, Supplement acceptance, Weight trends  REASON FOR ASSESSMENT:   Malnutrition Screening Tool    ASSESSMENT:   Pt with PMH of CHF/cardiomyopathy, CAD, uncontrolled DM, HLD, and HTN. Admitted for acute on chronic CHF, tachycardia, and hyperglycemia.    Pt currently on heart healthy carb modified diet, consuming 50% of her last meal. Pt reports decreased appetite x2 weeks, consuming a couple of bites per meal TID (<75%). States last month she weighed 172 lbs compared to to her recent weight of 152 lb (12 % in 1 month), however has suffered from bilateral lower extremity edema and fluctuating weights. Weight records indicate in June 2018 she weighed 171 lbs. Suspect pt meets malnutrition guidelines for weight loss, but fluid accumulation may be masking loses. Uncontrolled DM contributing to weight loss. Per physical assessment, pt showed to have mild depletion of muscle mass in her temple and clavicle regions. Showed to have severe edema in bilateral lower extremities up to her knee cap, though probable cause is likely due to CHF symptoms.   When asked about most recent A1c, pt reports not taking her medications regularly due to money hardships, but can afford them now. Pt unable to recall previous diet educations. Unaware of  carbohydrates and the effect on blood sugar. Unsure of how to read a food  label and the importance of consistent carbohydrates in the diet.   Lab Results  Component Value Date   HGBA1C 15.4 (H) 12/20/2016   RD provided "Carbohydrate Counting for People with Diabetes" handout from the Academy of Nutrition and Dietetics. Discussed different food groups and their effects on blood sugar, emphasizing carbohydrate-containing foods. Provided list of carbohydrates and recommended serving sizes of common foods.  Discussed importance of controlled and consistent carbohydrate intake throughout the day. Provided examples of ways to balance meals/snacks and encouraged intake of high-fiber, whole grain complex carbohydrates. Teach back method used.  Expect minimal compliance.  Medications reviewed and include: ferrous sulfate, lasix, SSI, Lantus Labs reviewed: K 3.0 CBG 127   Diet Order:  Diet heart healthy/carb modified Room service appropriate? Yes; Fluid consistency: Thin  Skin:  Wound (see comment) (Cysts x2 mid chest and under R breast)  Last BM:  02/07/2017  Height:   Ht Readings from Last 1 Encounters:  02/09/17 5\' 6"  (1.676 m)    Weight:   Wt Readings from Last 1 Encounters:  02/09/17 152 lb 8 oz (69.2 kg)    Ideal Body Weight:  59.1 kg  BMI:  Body mass index is 24.61 kg/m.  Estimated Nutritional Needs:   Kcal:  1700-2000  Protein:  75-85  Fluid:  > 1.7 L/day   EDUCATION NEEDS:   Education needs addressed  Mariana Single RD, LDN 02/09/2017 3:46 PM

## 2017-02-09 NOTE — Evaluation (Signed)
Physical Therapy Evaluation Patient Details Name: Amber Young MRN: [id number] DOB: 07/24/1956 Today's Date: 02/09/2017   History of Present Illness  Pt is a 61 yo female admitted with c/o extreme fatigue and tachycardia, visit dx acute on chronic systolic CHF. PMH significant for ischemic cardiomyopathy/CHF, EF 40-45 percent on Echo done 04/2016, CAD status post stent to the mid LAD 05/2016 with dual antiplatelets recommended for 1 year, diabetes, hypertension, and PAF  Clinical Impression  Patient evaluated by Physical Therapy with no further acute PT needs identified. All education has been completed and the patient has no further questions. Pt is supervision for bed mobility, transfers, ambulation of 250 feet and ascent/descent of 8 steps. PT recommends referral to mobility team for duration of admittance. See below for any follow-up Physical Therapy or equipment needs. PT is signing off. Thank you for this referral.     Follow Up Recommendations No PT follow up    Equipment Recommendations  None recommended by PT    Recommendations for Other Services       Precautions / Restrictions Precautions Precautions: None Restrictions Weight Bearing Restrictions: No      Mobility  Bed Mobility Overal bed mobility: Modified Independent             General bed mobility comments: used bedrails to assist in pulling EoB  Transfers Overall transfer level: Needs assistance   Transfers: Sit to/from Stand Sit to Stand: Supervision         General transfer comment: supervision for safety, slowed but steady ascent   Ambulation/Gait Ambulation/Gait assistance: Supervision Ambulation Distance (Feet): 250 Feet Assistive device: None Gait Pattern/deviations: WFL(Within Functional Limits) Gait velocity: slowed Gait velocity interpretation: Below normal speed for age/gender General Gait Details: supervision for safety, slow, steady cadence, used handrails in hall occasionally in  last 20 feet of ambulation   Stairs Stairs: Yes Stairs assistance: Supervision Stair Management: One rail Left;Step to pattern;Alternating pattern Number of Stairs: 8 General stair comments: pt uses alternating pattern to climb stairs and step to pattern to descend stairs, both slow and steady, supervision for safety      Balance Overall balance assessment: No apparent balance deficits (not formally assessed)                                           Pertinent Vitals/Pain Pain Assessment: No/denies pain  VSS    Home Living Family/patient expects to be discharged to:: Private residence Living Arrangements: Spouse/significant other Available Help at Discharge: Family;Available 24 hours/day Type of Home: House Home Access: Stairs to enter Entrance Stairs-Rails: Can reach both Entrance Stairs-Number of Steps: 4 Home Layout: One level Home Equipment: Tub bench;Grab bars - tub/shower      Prior Function Level of Independence: Independent         Comments: community ambulator and driver        Extremity/Trunk Assessment   Upper Extremity Assessment Upper Extremity Assessment: Overall WFL for tasks assessed;Generalized weakness    Lower Extremity Assessment Lower Extremity Assessment: Overall WFL for tasks assessed;Generalized weakness    Cervical / Trunk Assessment Cervical / Trunk Assessment: Normal  Communication   Communication: No difficulties  Cognition Arousal/Alertness: Awake/alert Behavior During Therapy: WFL for tasks assessed/performed Overall Cognitive Status: Within Functional Limits for tasks assessed  General Comments General comments (skin integrity, edema, etc.): at rest SaO2 on RA 95%O2, HR 93 bpm, after activity SaO2 on RA 95% O2, HR 96 bpm        Assessment/Plan    PT Assessment Patent does not need any further PT services         PT Goals (Current goals can be  found in the Care Plan section)  Acute Rehab PT Goals Patient Stated Goal: go home before the weekend PT Goal Formulation: With patient     AM-PAC PT "6 Clicks" Daily Activity  Outcome Measure Difficulty turning over in bed (including adjusting bedclothes, sheets and blankets)?: None Difficulty moving from lying on back to sitting on the side of the bed? : None Difficulty sitting down on and standing up from a chair with arms (e.g., wheelchair, bedside commode, etc,.)?: None Help needed moving to and from a bed to chair (including a wheelchair)?: None Help needed walking in hospital room?: None Help needed climbing 3-5 steps with a railing? : None 6 Click Score: 24    End of Session Equipment Utilized During Treatment: Gait belt Activity Tolerance: Patient tolerated treatment well Patient left: in chair;with call bell/phone within reach Nurse Communication: Mobility status PT Visit Diagnosis: Muscle weakness (generalized) (M62.81)    Time: 4401-0272 PT Time Calculation (min) (ACUTE ONLY): 20 min   Charges:   PT Evaluation $PT Eval Low Complexity: 1 Procedure     PT G Codes:   PT G-Codes **NOT FOR INPATIENT CLASS** Functional Assessment Tool Used: AM-PAC 6 Clicks Basic Mobility Functional Limitation: Mobility: Walking and moving around Mobility: Walking and Moving Around Current Status (Z3664): 0 percent impaired, limited or restricted Mobility: Walking and Moving Around Goal Status (Q0347): 0 percent impaired, limited or restricted Mobility: Walking and Moving Around Discharge Status (Q2595): 0 percent impaired, limited or restricted    Benjamine Mola B. Migdalia Dk PT, DPT Acute Rehabilitation  [phone] Pager [phone]    [place] 02/09/2017, 8:55 AM

## 2017-02-09 NOTE — Care Management Note (Signed)
Case Management Note  Patient Details  Name: Amber Young MRN: [id number] Date of Birth: 12-28-1955  Subjective/Objective:     Admitted with CHF              Action/Plan: Patient lives at home with spouse; PCP: Scot Jun, FNP; she goes to the [facility] for primary care; no medical insurance at this time, Medicaid application pending; active with [facility] for HHRN/ PT; CM will continue to follow for DCP.  Expected Discharge Date:    possibly 02/13/2017              Expected Discharge Plan:  [name]  Discharge planning Services  CM Consult  HH Arranged:  RN, PT [facility] Agency:  [place]  Status of Service:  In process, will continue to follow  Sherrilyn Rist [phone] 02/09/2017, 9:50 AM

## 2017-02-10 DIAGNOSIS — E0800 Diabetes mellitus due to underlying condition with hyperosmolarity without nonketotic hyperglycemic-hyperosmolar coma (NKHHC): Secondary | ICD-10-CM

## 2017-02-10 DIAGNOSIS — I480 Paroxysmal atrial fibrillation: Secondary | ICD-10-CM

## 2017-02-10 DIAGNOSIS — I5043 Acute on chronic combined systolic (congestive) and diastolic (congestive) heart failure: Secondary | ICD-10-CM

## 2017-02-10 DIAGNOSIS — Z794 Long term (current) use of insulin: Secondary | ICD-10-CM

## 2017-02-10 DIAGNOSIS — I2510 Atherosclerotic heart disease of native coronary artery without angina pectoris: Secondary | ICD-10-CM

## 2017-02-10 DIAGNOSIS — I10 Essential (primary) hypertension: Secondary | ICD-10-CM

## 2017-02-10 DIAGNOSIS — Z9861 Coronary angioplasty status: Secondary | ICD-10-CM

## 2017-02-10 DIAGNOSIS — I255 Ischemic cardiomyopathy: Secondary | ICD-10-CM

## 2017-02-10 LAB — GLUCOSE, CAPILLARY
GLUCOSE-CAPILLARY: 164 mg/dL — AB (ref 65–99)
GLUCOSE-CAPILLARY: 120 mg/dL — AB (ref 65–99)
Glucose-Capillary: 53 mg/dL — ABNORMAL LOW (ref 65–99)
Glucose-Capillary: 117 mg/dL — ABNORMAL HIGH (ref 65–99)
Glucose-Capillary: 141 mg/dL — ABNORMAL HIGH (ref 65–99)
Glucose-Capillary: 166 mg/dL — ABNORMAL HIGH (ref 65–99)

## 2017-02-10 LAB — BASIC METABOLIC PANEL
Anion gap: 10 (ref 5–15)
BUN: 25 mg/dL — AB (ref 6–20)
CO2: 25 mmol/L (ref 22–32)
CREATININE: 1.26 mg/dL — AB (ref 0.44–1.00)
Calcium: 8.2 mg/dL — ABNORMAL LOW (ref 8.9–10.3)
Chloride: 104 mmol/L (ref 101–111)
GFR calc Af Amer: 53 mL/min — ABNORMAL LOW (ref >60)
GFR, EST NON AFRICAN AMERICAN: 45 mL/min — AB (ref >60)
GLUCOSE: 73 mg/dL (ref 65–99)
Potassium: 3.5 mmol/L (ref 3.5–5.1)
SODIUM: 139 mmol/L (ref 135–145)

## 2017-02-10 LAB — PROTIME-INR
INR: 2.08
PROTHROMBIN TIME: 23.7 s — AB (ref 11.4–15.2)

## 2017-02-10 MED ORDER — WARFARIN SODIUM 7.5 MG PO TABS
7.5000 mg | ORAL_TABLET | Freq: Once | ORAL | Status: AC
  Administered 2017-02-10: 7.5 mg via ORAL
  Filled 2017-02-10: qty 1

## 2017-02-10 MED ORDER — FUROSEMIDE 40 MG PO TABS
40.0000 mg | ORAL_TABLET | Freq: Every day | ORAL | Status: DC
  Administered 2017-02-10 – 2017-02-11 (×2): 40 mg via ORAL
  Filled 2017-02-10 (×2): qty 1

## 2017-02-10 NOTE — Progress Notes (Signed)
PROGRESS NOTE    Amber Young  MLY:[id number] DOB: July 06, 1956 DOA: 02/08/2017 PCP: Scot Jun, FNP    Brief Narrative: 61 year old lady with ischemic cardiomyopathy, CAD, s/p PCI to LAD , hypertension. PAF,DM, comes in for DOE. Admitted for acute on chronic systolic heart failure.   Assessment & Plan:   Principal Problem:   Acute on chronic combined systolic and diastolic CHF (congestive heart failure) (HCC) Active Problems:   Uncontrolled hypertension   Uncontrolled diabetes mellitus (HCC)   History of stroke   Cardiomyopathy, ischemic   CAD S/P PCI mLAD DES (Promus 3.0 x 24 -- 3.25 mm)   PAF (paroxysmal atrial fibrillation) (HCC)   Acute on chronic systolic and diastolic heart failure:  Possibly from non compliant to diet and fluid intake.  Started on IV lasix 40 BID, had to be switched to po lasix daily, as her creatinine started worsening.  Daily weights.  Intake and output.  diuresed about 4 lit since admission.  mnitor renal parameters on lasix.  Replete potassium as needed.  Mildly elevated troponins from demand ischemia.     Uncontrolled Hypertension:  Non compliance to meds.  Resume coreg, clonidine, losartan and prn hydralazine.    Diabetes Mellitus:  CBG (last 3)   Recent Labs  02/10/17 0728 02/10/17 1116 02/10/17 1652  GLUCAP 141* 166* 164*    Resume SSI.  Resume Lantus 30 units daily.  A1c is 15.4 last month.    PAF: Resume coumadin as per pharmacy.     CAD: S/p PCI to LAD Resume Brillinta.  Resume coreg, statin and brillinta.    DVT prophylaxis: coumadin.  Code Status: full code Family Communication:none at bedside.  Disposition Plan: home possibly in am.    Consultants:   None.   Procedures:  None.    Antimicrobials: none.   Subjective: Breathing has improved. No chest pain.  No nausea, vomiting. No abd pain.    Objective: Vitals:   02/10/17 0305 02/10/17 0335 02/10/17 0618 02/10/17 1111  BP:  (!)  151/78 (!) 162/79 (!) 159/74  Pulse:  (!) 59 (!) 58 60  Resp:  20    Temp:  98.1 F (36.7 C)  98 F (36.7 C)  TempSrc:  Oral    SpO2:  91% 100% 100%  Weight: 69.2 kg (152 lb 8 oz)     Height:        Intake/Output Summary (Last 24 hours) at 02/10/17 1859 Last data filed at 02/10/17 1700  Gross per 24 hour  Intake              940 ml  Output             2350 ml  Net            -1410 ml   Filed Weights   02/08/17 2053 02/09/17 0520 02/10/17 0305  Weight: 70.9 kg (156 lb 3.2 oz) 69.2 kg (152 lb 8 oz) 69.2 kg (152 lb 8 oz)    Examination:  General exam: Appears calm and comfortable  Respiratory system: Clear to auscultation. Respiratory effort normal. Cardiovascular system: S1 & S2 heard, RRR. No JVD, murmurs, rubs, gallops or clicks. No pedal edema. Gastrointestinal system: Abdomen is nondistended, soft and nontender. No organomegaly or masses felt. Normal bowel sounds heard. Central nervous system: Alert and oriented. No focal neurological deficits. Extremities: Symmetric 5 x 5 power. Skin: No rashes, lesions or ulcers Psychiatry: Judgement and insight appear normal. Mood & affect appropriate.  Data Reviewed: I have personally reviewed following labs and imaging studies  CBC:  Recent Labs Lab 02/08/17 1018 02/08/17 1131  WBC 6.9 6.7  NEUTROABS 4,692  --   HGB 9.8* 10.1*  HCT 31.1* 30.7*  MCV 77.0* 75.4*  PLT 280 397   Basic Metabolic Panel:  Recent Labs Lab 02/08/17 1018 02/08/17 1131 02/09/17 0713 02/10/17 0247  NA 133* 134* 139 139  K 3.8 4.1 3.0* 3.5  CL 100 101 102 104  CO2 21 23 26 25   GLUCOSE 401* 382* 127* 73  BUN 20 20 18  25*  CREATININE 1.00* 1.10* 1.14* 1.26*  CALCIUM 8.5* 8.8* 8.5* 8.2*   GFR: Estimated Creatinine Clearance: 44.4 mL/min (A) (by C-G formula based on SCr of 1.26 mg/dL (H)). Liver Function Tests:  Recent Labs Lab 02/08/17 1018  AST 39*  ALT 41*  ALKPHOS 296*  BILITOT 0.9  PROT 7.0  ALBUMIN 3.2*   No results  for input(s): LIPASE, AMYLASE in the last 168 hours. No results for input(s): AMMONIA in the last 168 hours. Coagulation Profile:  Recent Labs Lab 02/08/17 1843 02/09/17 0713 02/10/17 0247  INR 3.22 2.63 2.08   Cardiac Enzymes:  Recent Labs Lab 02/08/17 1131 02/08/17 1843 02/09/17 0054 02/09/17 0713  TROPONINI 0.03* 0.04* 0.03* 0.04*   BNP (last 3 results) No results for input(s): PROBNP in the last 8760 hours. HbA1C: No results for input(s): HGBA1C in the last 72 hours. CBG:  Recent Labs Lab 02/10/17 0624 02/10/17 0659 02/10/17 0728 02/10/17 1116 02/10/17 1652  GLUCAP 53* 117* 141* 166* 164*   Lipid Profile: No results for input(s): CHOL, HDL, LDLCALC, TRIG, CHOLHDL, LDLDIRECT in the last 72 hours. Thyroid Function Tests: No results for input(s): TSH, T4TOTAL, FREET4, T3FREE, THYROIDAB in the last 72 hours. Anemia Panel: No results for input(s): VITAMINB12, FOLATE, FERRITIN, TIBC, IRON, RETICCTPCT in the last 72 hours. Sepsis Labs: No results for input(s): PROCALCITON, LATICACIDVEN in the last 168 hours.  No results found for this or any previous visit (from the past 240 hour(s)).       Radiology Studies: No results found.      Scheduled Meds: . atorvastatin  40 mg Oral q1800  . carvedilol  25 mg Oral BID  . cloNIDine  0.1 mg Oral BID  . famotidine  20 mg Oral Daily  . feeding supplement (GLUCERNA SHAKE)  237 mL Oral TID BM  . ferrous sulfate  325 mg Oral Q breakfast  . furosemide  40 mg Oral Daily  . insulin aspart  0-20 Units Subcutaneous TID WC  . insulin aspart  0-5 Units Subcutaneous QHS  . insulin glargine  30 Units Subcutaneous QHS  . losartan  100 mg Oral Daily  . nystatin   Topical BID  . potassium chloride SA  40 mEq Oral Daily  . sodium chloride flush  3 mL Intravenous Q12H  . ticagrelor  90 mg Oral BID  . Warfarin - Pharmacist Dosing Inpatient   Does not apply q1800   Continuous Infusions: . sodium chloride       LOS: 1 day      Time spent: 78 min    Amber Lepage, MD [facility] Pager [phone]  If 7PM-7AM, please contact night-coverage [email or web address] Password [email or web address] 02/10/2017, 6:59 PM

## 2017-02-10 NOTE — Progress Notes (Signed)
ANTICOAGULATION CONSULT NOTE  Pharmacy Consult:  Coumadin Indication: atrial fibrillation  Allergies  Allergen Reactions  . Ace Inhibitors Cough    Patient Measurements: Height = 66.5 inches Weight = 73 kg  Vital Signs: Temp: 98.1 F (36.7 C) (07/04 0335) Temp Source: Oral (07/04 0335) BP: 162/79 (07/04 0618) Pulse Rate: 58 (07/04 0618)  Labs:  Recent Labs  02/08/17 1018  02/08/17 1131 02/08/17 1843 02/09/17 0054 02/09/17 0713 02/10/17 0247  HGB 9.8*  --  10.1*  --   --   --   --   HCT 31.1*  --  30.7*  --   --   --   --   PLT 280  --  257  --   --   --   --   LABPROT  --   --   --  33.7*  --  28.6* 23.7*  INR  --   --   --  3.22  --  2.63 2.08  CREATININE 1.00*  --  1.10*  --   --  1.14* 1.26*  TROPONINI  --   < > 0.03* 0.04* 0.03* 0.04*  --   < > = values in this interval not displayed.  Estimated Creatinine Clearance: 44.4 mL/min (A) (by C-G formula based on SCr of 1.26 mg/dL (H)).   Medical History: Past Medical History:  Diagnosis Date  . Anemia   . CHF (congestive heart failure) ([place])   . Coronary artery disease   . GERD (gastroesophageal reflux disease)   . Heart murmur   . Hyperlipidemia   . Hypertension   . Stroke ([place]) 09/2014   numbness left upper lip, finger tips on left hand; "resolved" (05/18/2016)  . Type II diabetes mellitus (HCC)     Assessment: 61 yoF on Coumadin at home for afib presents with worsening SOB. INR was  supratherapeutic on admit at 3.22 and dose held 7/2, now INR therapeutic at 2.08 but trending down from 2.63 yesterday s/p reduced dose.  PTA Coumadin dose: 7.5mg  PO daily  Goal of Therapy:  INR 2-3   Plan:  -Warfarin 7.5mg  PO x1 -Daily INR -Monitor S/Sx bleeding  Arrie Senate, PharmD PGY-2 Cardiology Pharmacy Resident Pager: [phone] 02/10/2017

## 2017-02-10 NOTE — Discharge Instructions (Signed)

## 2017-02-11 LAB — GLUCOSE, CAPILLARY
Glucose-Capillary: 74 mg/dL (ref 65–99)
Glucose-Capillary: 102 mg/dL — ABNORMAL HIGH (ref 65–99)

## 2017-02-11 LAB — BASIC METABOLIC PANEL
Anion gap: 6 (ref 5–15)
BUN: 23 mg/dL — AB (ref 6–20)
CALCIUM: 8.1 mg/dL — AB (ref 8.9–10.3)
CO2: 28 mmol/L (ref 22–32)
CREATININE: 1.29 mg/dL — AB (ref 0.44–1.00)
Chloride: 105 mmol/L (ref 101–111)
GFR calc non Af Amer: 44 mL/min — ABNORMAL LOW (ref >60)
GFR, EST AFRICAN AMERICAN: 51 mL/min — AB (ref >60)
Glucose, Bld: 62 mg/dL — ABNORMAL LOW (ref 65–99)
Potassium: 3.4 mmol/L — ABNORMAL LOW (ref 3.5–5.1)
SODIUM: 139 mmol/L (ref 135–145)

## 2017-02-11 LAB — PROTIME-INR
INR: 2.35
PROTHROMBIN TIME: 26.1 s — AB (ref 11.4–15.2)

## 2017-02-11 MED ORDER — INSULIN LISPRO 100 UNIT/ML ~~LOC~~ SOLN: SUBCUTANEOUS | 11 refills | Status: DC

## 2017-02-11 MED ORDER — WARFARIN SODIUM 7.5 MG PO TABS: 7.5000 mg | ORAL_TABLET | Freq: Once | ORAL | Status: DC

## 2017-02-11 MED ORDER — INSULIN GLARGINE 100 UNIT/ML ~~LOC~~ SOLN
20.0000 [IU] | Freq: Every day | SUBCUTANEOUS | Status: DC
  Filled 2017-02-11: qty 0.2

## 2017-02-11 MED ORDER — BLOOD GLUCOSE TEST VI STRP: ORAL_STRIP | 0 refills | Status: DC

## 2017-02-11 MED ORDER — GLUCERNA SHAKE PO LIQD: 237.0000 mL | Freq: Three times a day (TID) | ORAL | 0 refills | Status: DC

## 2017-02-11 MED FILL — TRUE METRIX TEST STRIP: 30 days supply | Qty: 100 | Fill #0

## 2017-02-11 NOTE — Progress Notes (Signed)
Discharge to home.D/c instructions anf follow up appointments discussed with patient, verbalized understanding. PIV removed no s/s of swelling noted.

## 2017-02-11 NOTE — Progress Notes (Signed)
ANTICOAGULATION CONSULT NOTE  Pharmacy Consult:  Coumadin Indication: atrial fibrillation  Allergies  Allergen Reactions  . Ace Inhibitors Cough    Patient Measurements: Height = 66.5 inches Weight = 73 kg  Vital Signs: Temp: 97.7 F (36.5 C) (07/05 0532) Temp Source: Oral (07/05 0532) BP: 154/72 (07/05 0938) Pulse Rate: 63 (07/05 0532)  Labs:  Recent Labs  02/08/17 1131  02/08/17 1843 02/09/17 0054 02/09/17 0713 02/10/17 0247 02/11/17 0256  HGB 10.1*  --   --   --   --   --   --   HCT 30.7*  --   --   --   --   --   --   PLT 257  --   --   --   --   --   --   LABPROT  --   < > 33.7*  --  28.6* 23.7* 26.1*  INR  --   < > 3.22  --  2.63 2.08 2.35  CREATININE 1.10*  --   --   --  1.14* 1.26* 1.29*  TROPONINI 0.03*  --  0.04* 0.03* 0.04*  --   --   < > = values in this interval not displayed.  Estimated Creatinine Clearance: 43.4 mL/min (A) (by C-G formula based on SCr of 1.29 mg/dL (H)).   Medical History: Past Medical History:  Diagnosis Date  . Anemia   . CHF (congestive heart failure) ([place])   . Coronary artery disease   . GERD (gastroesophageal reflux disease)   . Heart murmur   . Hyperlipidemia   . Hypertension   . Stroke ([place]) 09/2014   numbness left upper lip, finger tips on left hand; "resolved" (05/18/2016)  . Type II diabetes mellitus (HCC)     Assessment: 3 yoF on Coumadin at home for afib presents with worsening SOB. INR was supratherapeutic on admit at 3.22 and dose held 7/2, now INR therapeutic and up slightly from yesterday. Most likely haven't realized full effects of yesterdays dose. Last CBC okay; no bleeding noted.   PTA Coumadin dose: 7.5mg  PO daily  Goal of Therapy:  INR 2-3   Plan:  -Warfarin 7.5mg  PO x1 -AM CBC -Daily INR -Monitor S/Sx bleeding  Dierdre Harness, [place], PharmD Clinical Pharmacy Resident [phone] (Pager) 02/11/2017 10:37 AM

## 2017-02-11 NOTE — Progress Notes (Signed)
Inpatient Diabetes Program Recommendations  AACE/ADA: New Consensus Statement on Inpatient Glycemic Control (2015)  Target Ranges:  Prepandial:   less than 140 mg/dL      Peak postprandial:   less than 180 mg/dL (1-2 hours)      Critically ill patients:  140 - 180 mg/dL   Lab Results  Component Value Date   GLUCAP 74 02/11/2017   HGBA1C 15.4 (H) 12/20/2016    Review of Glycemic Control:  Results for ADASIA, HOAR (MRN [id number]) as of 02/11/2017 10:02  Ref. Range 02/09/2017 07:40 02/09/2017 11:59 02/09/2017 14:46 02/09/2017 16:55 02/09/2017 20:42 02/10/2017 06:24 02/10/2017 06:59 02/10/2017 07:28 02/10/2017 11:16 02/10/2017 16:52 02/10/2017 21:23 02/11/2017 07:29  Glucose-Capillary Latest Ref Range: 65 - 99 mg/dL 120 (H) 203 (H) 275 (H) 224 (H) 232 (H) 53 (L) 117 (H) 141 (H) 166 (H) 164 (H) 120 (H) 74   Diabetes history: Type 2 diabetes Outpatient Diabetes medications: Lantus 20 units daily q HS, Humalog 5 units bid Current orders for Inpatient glycemic control:  Novolog resistant tid with meals and HS, Lantus 30 units q HS  Inpatient Diabetes Program Recommendations:   May consider reducing Lantus to 20 units.  Also consider reducing Novolog correction to sensitive tid with meals and add Novolog meal coverage 3 units tid with meals-hold if patient eats less than 50%.  Thanks, Adah Perl, RN, BC-ADM Inpatient Diabetes Coordinator Pager [phone] (8a-5p)

## 2017-02-12 MED FILL — !HUMALOG 100 UNITS/ML VIAL: 100 | 28 days supply | Qty: 10 | Fill #0

## 2017-02-12 NOTE — Discharge Summary (Signed)
Physician Discharge Summary  Amber Young CBJ:[id number] DOB: January 21, 1956 DOA: 02/08/2017  PCP: Scot Jun, FNP  Admit date: 02/08/2017 Discharge date: 02/11/2017  Admitted From:Home.  Disposition:  Home.   Recommendations for Outpatient Follow-up:  1. Follow up with PCP in 1-2 weeks 2. Please obtain BMP/CBC in one week 3. Please follow up with cardiology in one week.     Discharge Condition:stable.  CODE STATUS: full code.  Diet recommendation: Heart Healthy    Brief/Interim Summary: 61 year old lady with ischemic cardiomyopathy, CAD, s/p PCI to LAD , hypertension. PAF,DM, comes in for DOE. Admitted for acute on chronic systolic heart failure.   Discharge Diagnoses:  Principal Problem:   Acute on chronic combined systolic and diastolic CHF (congestive heart failure) (HCC) Active Problems:   Uncontrolled hypertension   Uncontrolled diabetes mellitus (HCC)   History of stroke   Cardiomyopathy, ischemic   CAD S/P PCI mLAD DES (Promus 3.0 x 24 -- 3.25 mm)   PAF (paroxysmal atrial fibrillation) (HCC)  Acute on chronic systolic and diastolic heart failure:  Possibly from non compliant to diet and fluid intake.  Started on IV lasix 40 BID, had to be switched to po lasix daily, as her creatinine started worsening.  Daily weights.  Intake and output.  diuresed about 4 lit since admission. Recommend checking renal parameters in 1 week.  Recommend follow up with cardiology in 1 to 2 weeks.  Replete potassium as needed.  Mildly elevated troponins from demand ischemia.     Uncontrolled Hypertension:  Non compliance to meds.  Resume coreg, clonidine, losartan   Diabetes Mellitus:  CBG (last 3)   Recent Labs  02/10/17 2123 02/11/17 0729 02/11/17 1104  GLUCAP 120* 74 102*     Resume SSI.  Resume Lantus 20  units daily.  A1c is 15.4 last month.    PAF: Resume coumadin as per pharmacy.     CAD: S/p PCI to LAD Resume Brillinta.  Resume coreg,  statin and brillinta.     Discharge Instructions  Discharge Instructions    (HEART FAILURE PATIENTS) Call MD:  Anytime you have any of the following symptoms: 1) 3 pound weight gain in 24 hours or 5 pounds in 1 week 2) shortness of breath, with or without a dry hacking cough 3) swelling in the hands, feet or stomach 4) if you have to sleep on extra pillows at night in order to breathe.    Complete by:  As directed    Diet - low sodium heart healthy    Complete by:  As directed    Discharge instructions    Complete by:  As directed    Follow up with PCP in 1 week. Please check BMP on Friday or Monday.  Please follow up with cardiology in 1 to 2 weeks.     Allergies as of 02/11/2017      Reactions   Ace Inhibitors Cough      Medication List    STOP taking these medications   traMADol 50 MG tablet Commonly known as:  ULTRAM     TAKE these medications   acetaminophen 325 MG tablet Commonly known as:  TYLENOL Take 650 mg by mouth every 6 (six) hours as needed for mild pain.   atorvastatin 40 MG tablet Commonly known as:  LIPITOR Take 1 tablet (40 mg total) by mouth daily at 6 PM.   BLOOD GLUCOSE TEST STRIPS Strp Use as directed   carvedilol 25 MG tablet Commonly known as:  COREG Take 1 tablet (25 mg total) by mouth 2 (two) times daily.   cloNIDine 0.1 MG tablet Commonly known as:  CATAPRES Take 1 tablet (0.1 mg total) by mouth 2 (two) times daily.   feeding supplement (GLUCERNA SHAKE) Liqd Take 237 mLs by mouth 3 (three) times daily between meals.   ferrous sulfate 325 (65 FE) MG tablet Commonly known as:  FERROUSUL Take 1 tablet (325 mg total) by mouth daily with breakfast.   furosemide 40 MG tablet Commonly known as:  LASIX Take 1 tablet (40 mg total) by mouth daily.   insulin glargine 100 UNIT/ML injection Commonly known as:  LANTUS Inject 0.3 mLs (30 Units total) into the skin at bedtime. What changed:  how much to take   insulin lispro 100 UNIT/ML  injection Commonly known as:  HUMALOG CBG 70 - 120: 0 units CBG 121 - 150: 3 units CBG 151 - 200: 4 units CBG 201 - 250: 7 units CBG 251 - 300: 11 units CBG 301 - 350: 15 units CBG 351 - 400: 20 units What changed:  how much to take  how to take this  when to take this  additional instructions   Insulin Pen Needle 29G X 10MM Misc Use as directed   INSULIN SYRINGE .5CC/29G 29G X 1/2" 0.5 ML Misc Use to administer insulin three times daily   losartan 50 MG tablet Commonly known as:  COZAAR Take 1 tablet (50 mg total) by mouth daily.   potassium chloride SA 20 MEQ tablet Commonly known as:  K-DUR,KLOR-CON Take 2 tablets (40 mEq total) by mouth daily. What changed:  how much to take   ranitidine 150 MG tablet Commonly known as:  ZANTAC Take 1 tablet (150 mg total) by mouth 2 (two) times daily.   ticagrelor 90 MG Tabs tablet Commonly known as:  BRILINTA Take 1 tablet (90 mg total) by mouth 2 (two) times daily.   warfarin 5 MG tablet Commonly known as:  COUMADIN Take 1 tablet (5 mg total) by mouth daily at 6 PM. What changed:  how much to take      Follow-up Information    [facility] Follow up.   Why:  They will do your home health care at your home, please check BMP and fax the results to PCP on Friday 02/11/2017.  Contact information: [place] [address] [phone]        Scot Jun, FNP Follow up on 02/15/2017.   Specialty:  Family Medicine Why:  check BMP at your PCP office.   Contact information: [place] [address] [phone]        [facility] [employer]. Schedule an appointment as soon as possible for a visit in 1 week(s).   Specialty:  Cardiology Contact information: [address], [place] [address] [phone]         Allergies  Allergen Reactions  . Ace Inhibitors Cough    Consultations: None.   Procedures/Studies: Dg  Chest 2 View  Result Date: 02/08/2017 CLINICAL DATA:  Shortness of Breath EXAM: CHEST  2 VIEW COMPARISON:  Dec 27, 2016 FINDINGS: There is no edema or consolidation. Heart is mildly enlarged with slight pulmonary venous hypertension. No adenopathy. No bone lesions. IMPRESSION: Evidence a degree of pulmonary vascular congestion without edema or consolidation. No evident adenopathy. Electronically Signed   By: Lowella Grip III M.D.   On: 02/08/2017 13:13  Subjective: No chest pain. No sob, no nausea, vomiting  no abdominal pain.   Discharge Exam: Vitals:   02/11/17 0532 02/11/17 0938  BP: 130/67 (!) 154/72  Pulse: 63   Resp: 17   Temp: 97.7 F (36.5 C)    Vitals:   02/10/17 1111 02/10/17 1944 02/11/17 0532 02/11/17 0938  BP: (!) 159/74 (!) 147/62 130/67 (!) 154/72  Pulse: 60 64 63   Resp:  18 17   Temp: 98 F (36.7 C) 97.9 F (36.6 C) 97.7 F (36.5 C)   TempSrc:  Oral Oral   SpO2: 100% 100% 98%   Weight:   69.4 kg (152 lb 14.4 oz)   Height:        General: Pt is alert, awake, not in acute distress Cardiovascular: RRR, S1/S2 +, no rubs, no gallops Respiratory: CTA bilaterally, no wheezing, no rhonchi Abdominal: Soft, NT, ND, bowel sounds + Extremities: no edema, no cyanosis    The results of significant diagnostics from this hospitalization (including imaging, microbiology, ancillary and laboratory) are listed below for reference.     Microbiology: No results found for this or any previous visit (from the past 240 hour(s)).   Labs: BNP (last 3 results)  Recent Labs  01/18/17 1003 02/08/17 1018 02/08/17 1131  BNP 349.8* 3,261* 2,355.7*   Basic Metabolic Panel:  Recent Labs Lab 02/08/17 1018 02/08/17 1131 02/09/17 0713 02/10/17 0247 02/11/17 0256  NA 133* 134* 139 139 139  K 3.8 4.1 3.0* 3.5 3.4*  CL 100 101 102 104 105  CO2 21 23 26 25 28   GLUCOSE 401* 382* 127* 73 62*  BUN 20 20 18  25* 23*  CREATININE 1.00* 1.10* 1.14* 1.26* 1.29*   CALCIUM 8.5* 8.8* 8.5* 8.2* 8.1*   Liver Function Tests:  Recent Labs Lab 02/08/17 1018  AST 39*  ALT 41*  ALKPHOS 296*  BILITOT 0.9  PROT 7.0  ALBUMIN 3.2*   No results for input(s): LIPASE, AMYLASE in the last 168 hours. No results for input(s): AMMONIA in the last 168 hours. CBC:  Recent Labs Lab 02/08/17 1018 02/08/17 1131  WBC 6.9 6.7  NEUTROABS 4,692  --   HGB 9.8* 10.1*  HCT 31.1* 30.7*  MCV 77.0* 75.4*  PLT 280 257   Cardiac Enzymes:  Recent Labs Lab 02/08/17 1131 02/08/17 1843 02/09/17 0054 02/09/17 0713  TROPONINI 0.03* 0.04* 0.03* 0.04*   BNP: Invalid input(s): POCBNP CBG:  Recent Labs Lab 02/10/17 1116 02/10/17 1652 02/10/17 2123 02/11/17 0729 02/11/17 1104  GLUCAP 166* 164* 120* 74 102*   D-Dimer No results for input(s): DDIMER in the last 72 hours. Hgb A1c No results for input(s): HGBA1C in the last 72 hours. Lipid Profile No results for input(s): CHOL, HDL, LDLCALC, TRIG, CHOLHDL, LDLDIRECT in the last 72 hours. Thyroid function studies No results for input(s): TSH, T4TOTAL, T3FREE, THYROIDAB in the last 72 hours.  Invalid input(s): FREET3 Anemia work up No results for input(s): VITAMINB12, FOLATE, FERRITIN, TIBC, IRON, RETICCTPCT in the last 72 hours. Urinalysis    Component Value Date/Time   COLORURINE YELLOW 02/08/2017 1233   APPEARANCEUR HAZY (A) 02/08/2017 1233   LABSPEC 1.020 02/08/2017 1233   PHURINE 6.0 02/08/2017 1233   GLUCOSEU >=500 (A) 02/08/2017 1233   HGBUR LARGE (A) 02/08/2017 1233   BILIRUBINUR NEGATIVE 02/08/2017 1233   KETONESUR NEGATIVE 02/08/2017 1233   PROTEINUR >300 (A) 02/08/2017 1233   UROBILINOGEN 0.2 01/18/2017 0929   NITRITE NEGATIVE 02/08/2017 1233   LEUKOCYTESUR NEGATIVE 02/08/2017 1233  Sepsis Labs Invalid input(s): PROCALCITONIN,  WBC,  LACTICIDVEN Microbiology No results found for this or any previous visit (from the past 240 hour(s)).   Time coordinating discharge: Over 30  minutes  SIGNED:   Hosie Poisson, MD  [facility] 02/12/2017, 8:25 AM Pager   If 7PM-7AM, please contact night-coverage [email or web address] Password [email or web address]

## 2017-02-16 ENCOUNTER — Other Ambulatory Visit: Payer: Self-pay | Admitting: *Deleted

## 2017-02-16 MED ORDER — INSULIN LISPRO 100 UNIT/ML ~~LOC~~ SOLN: 5.0000 [IU] | Freq: Three times a day (TID) | SUBCUTANEOUS | 3 refills | Status: DC

## 2017-02-16 MED ORDER — TICAGRELOR 90 MG PO TABS: 90.0000 mg | ORAL_TABLET | Freq: Two times a day (BID) | ORAL | 3 refills | Status: DC

## 2017-02-16 MED ORDER — INSULIN GLARGINE 100 UNIT/ML ~~LOC~~ SOLN: 30.0000 [IU] | Freq: Every day | SUBCUTANEOUS | 3 refills | Status: DC

## 2017-02-16 NOTE — Telephone Encounter (Signed)
PRINTED FOR PASS PROGRAM 

## 2017-02-19 MED FILL — ?WARFARIN SODIUM 5 MG TABLE: 5 | 30 days supply | Qty: 30 | Fill #1

## 2017-02-19 MED FILL — ?FUROSEMIDE 40 MG TABLET: 40 | 30 days supply | Qty: 30 | Fill #1

## 2017-02-23 ENCOUNTER — Ambulatory Visit (INDEPENDENT_AMBULATORY_CARE_PROVIDER_SITE_OTHER): Payer: Self-pay | Admitting: Family Medicine

## 2017-02-23 ENCOUNTER — Telehealth: Payer: Self-pay | Admitting: Family Medicine

## 2017-02-23 ENCOUNTER — Encounter: Payer: Self-pay | Admitting: Family Medicine

## 2017-02-23 VITALS — BP 146/60 | HR 57 | Temp 97.6°F | Resp 14 | Ht 66.0 in | Wt 159.4 lb

## 2017-02-23 DIAGNOSIS — Z794 Long term (current) use of insulin: Secondary | ICD-10-CM

## 2017-02-23 DIAGNOSIS — E0800 Diabetes mellitus due to underlying condition with hyperosmolarity without nonketotic hyperglycemic-hyperosmolar coma (NKHHC): Secondary | ICD-10-CM

## 2017-02-23 DIAGNOSIS — I5042 Chronic combined systolic (congestive) and diastolic (congestive) heart failure: Secondary | ICD-10-CM

## 2017-02-23 DIAGNOSIS — I10 Essential (primary) hypertension: Secondary | ICD-10-CM

## 2017-02-23 LAB — COMPLETE METABOLIC PANEL WITH GFR
ALBUMIN: 3.2 g/dL — AB (ref 3.6–5.1)
ALK PHOS: 182 U/L — AB (ref 33–130)
ALT: 35 U/L — AB (ref 6–29)
AST: 47 U/L — AB (ref 10–35)
BILIRUBIN TOTAL: 0.4 mg/dL (ref 0.2–1.2)
BUN: 31 mg/dL — AB (ref 7–25)
CALCIUM: 8.5 mg/dL — AB (ref 8.6–10.4)
CO2: 24 mmol/L (ref 20–31)
CREATININE: 1.13 mg/dL — AB (ref 0.50–0.99)
Chloride: 104 mmol/L (ref 98–110)
GFR, Est African American: 61 mL/min (ref >=60)
GFR, Est Non African American: 53 mL/min — ABNORMAL LOW (ref >=60)
Glucose, Bld: 233 mg/dL — ABNORMAL HIGH (ref 65–99)
POTASSIUM: 4.8 mmol/L (ref 3.5–5.3)
Sodium: 139 mmol/L (ref 135–146)
TOTAL PROTEIN: 6.7 g/dL (ref 6.1–8.1)

## 2017-02-23 LAB — BASIC METABOLIC PANEL
BUN: 31 mg/dL — ABNORMAL HIGH (ref 7–25)
CALCIUM: 8.5 mg/dL — AB (ref 8.6–10.4)
CO2: 24 mmol/L (ref 20–31)
Chloride: 104 mmol/L (ref 98–110)
Creat: 1.13 mg/dL — ABNORMAL HIGH (ref 0.50–0.99)
Glucose, Bld: 233 mg/dL — ABNORMAL HIGH (ref 65–99)
Potassium: 4.8 mmol/L (ref 3.5–5.3)
SODIUM: 139 mmol/L (ref 135–146)

## 2017-02-23 LAB — CBC WITH DIFFERENTIAL/PLATELET
Basophils Absolute: 0 cells/uL (ref 0–200)
Basophils Relative: 0 %
EOS PCT: 1 %
Eosinophils Absolute: 51 cells/uL (ref 15–500)
HCT: 30.6 % — ABNORMAL LOW (ref 35.0–45.0)
HEMOGLOBIN: 9.5 g/dL — AB (ref 11.7–15.5)
LYMPHS ABS: 1428 {cells}/uL (ref 850–3900)
Lymphocytes Relative: 28 %
MCH: 24.3 pg — ABNORMAL LOW (ref 27.0–33.0)
MCHC: 31 g/dL — ABNORMAL LOW (ref 32.0–36.0)
MCV: 78.3 fL — ABNORMAL LOW (ref 80.0–100.0)
MONOS PCT: 9 %
MPV: 8.8 fL (ref 7.5–12.5)
Monocytes Absolute: 459 cells/uL (ref 200–950)
NEUTROS ABS: 3162 {cells}/uL (ref 1500–7800)
Neutrophils Relative %: 62 %
PLATELETS: 324 10*3/uL (ref 140–400)
RBC: 3.91 MIL/uL (ref 3.80–5.10)
RDW: 16.5 % — ABNORMAL HIGH (ref 11.0–15.0)
WBC: 5.1 10*3/uL (ref 3.8–10.8)

## 2017-02-23 LAB — POCT URINALYSIS DIP (DEVICE)
Bilirubin Urine: NEGATIVE
Glucose, UA: 100 mg/dL — AB
KETONES UR: NEGATIVE mg/dL
Nitrite: NEGATIVE
Protein, ur: 100 mg/dL — AB
SPECIFIC GRAVITY, URINE: 1.015 (ref 1.005–1.030)
UROBILINOGEN UA: 0.2 mg/dL (ref 0.0–1.0)
pH: 6.5 (ref 5.0–8.0)

## 2017-02-23 LAB — GLUCOSE, CAPILLARY: Glucose-Capillary: 242 mg/dL — ABNORMAL HIGH (ref 65–99)

## 2017-02-23 MED ORDER — INSULIN GLARGINE 100 UNIT/ML ~~LOC~~ SOLN: 20.0000 [IU] | Freq: Every day | SUBCUTANEOUS | 3 refills | Status: DC

## 2017-02-23 MED ORDER — AMLODIPINE BESYLATE 5 MG PO TABS: 5.0000 mg | ORAL_TABLET | Freq: Every day | ORAL | 3 refills | Status: DC

## 2017-02-23 MED ORDER — LOSARTAN POTASSIUM 50 MG PO TABS: 50.0000 mg | ORAL_TABLET | Freq: Two times a day (BID) | ORAL | 3 refills | Status: DC

## 2017-02-23 NOTE — Patient Instructions (Addendum)
Sliding Scale for Humalog:  CBG 70 - 120: 0 units CBG 121 - 150: 3 units CBG 151 - 200: 4 units CBG 201 - 250: 7 units CBG 251 - 300: 11 units  Continue to check blood sugar and notify me here at the office if readings are persistently greater than 200.  I have added Amlodipine 5 mg daily at bedtime for blood pressure management.  I have discontinued clonidine for blood pressure.   Heart Failure Heart failure means your heart has trouble pumping blood. This makes it hard for your body to work well. Heart failure is usually a long-term (chronic) condition. You must take good care of yourself and follow your doctor's treatment plan. Follow these instructions at home:  Take your heart medicine as told by your doctor. ? Do not stop taking medicine unless your doctor tells you to. ? Do not skip any dose of medicine. ? Refill your medicines before they run out. ? Take other medicines only as told by your doctor or pharmacist.  Stay active if told by your doctor. The elderly and people with severe heart failure should talk with a doctor about physical activity.  Eat heart-healthy foods. Choose foods that are without trans fat and are low in saturated fat, cholesterol, and salt (sodium). This includes fresh or frozen fruits and vegetables, fish, lean meats, fat-free or low-fat dairy foods, whole grains, and high-fiber foods. Lentils and dried peas and beans (legumes) are also good choices.  Limit salt if told by your doctor.  Cook in a healthy way. Roast, grill, broil, bake, poach, steam, or stir-fry foods.  Limit fluids as told by your doctor.  Weigh yourself every morning. Do this after you pee (urinate) and before you eat breakfast. Write down your weight to give to your doctor.  Take your blood pressure and write it down if your doctor tells you to.  Ask your doctor how to check your pulse. Check your pulse as told.  Lose weight if told by your doctor.  Stop smoking or chewing  tobacco. Do not use gum or patches that help you quit without your doctor's approval.  Schedule and go to doctor visits as told.  Nonpregnant women should have no more than 1 drink a day. Men should have no more than 2 drinks a day. Talk to your doctor about drinking alcohol.  Stop illegal drug use.  Stay current with shots (immunizations).  Manage your health conditions as told by your doctor.  Learn to manage your stress.  Rest when you are tired.  If it is really hot outside: ? Avoid intense activities. ? Use air conditioning or fans, or get in a cooler place. ? Avoid caffeine and alcohol. ? Wear loose-fitting, lightweight, and light-colored clothing.  If it is really cold outside: ? Avoid intense activities. ? Layer your clothing. ? Wear mittens or gloves, a hat, and a scarf when going outside. ? Avoid alcohol.  Learn about heart failure and get support as needed.  Get help to maintain or improve your quality of life and your ability to care for yourself as needed. Contact a doctor if:  You gain weight quickly.  You are more short of breath than usual.  You cannot do your normal activities.  You tire easily.  You cough more than normal, especially with activity.  You have any or more puffiness (swelling) in areas such as your hands, feet, ankles, or belly (abdomen).  You cannot sleep because it is hard to  breathe.  You feel like your heart is beating fast (palpitations).  You get dizzy or light-headed when you stand up. Get help right away if:  You have trouble breathing.  There is a change in mental status, such as becoming less alert or not being able to focus.  You have chest pain or discomfort.  You faint. This information is not intended to replace advice given to you by your health care provider. Make sure you discuss any questions you have with your health care provider. Document Released: 05/05/2008 Document Revised: 01/02/2016 Document  Reviewed: 09/12/2012 Elsevier Interactive Patient Education  2017 [employer].

## 2017-02-23 NOTE — Telephone Encounter (Signed)
Erroneous

## 2017-02-23 NOTE — Progress Notes (Signed)
Patient ID: Amber Young, female    DOB: 1955-08-17, 61 y.o.   MRN: [id number]  PCP: Scot Jun, FNP  Chief Complaint  Patient presents with  . Follow-up    1 MONTH  . Medication Problem    CLONIDINE MAKES HER DIZZY    Subjective:  HPI Amber Young Young a 61 y.o. female presents for hospital follow-up and medication concern. [name] presented to the clinic on 02/08/2017 with significant lower extremity swelling, severe hypertension, dyspnea, and an abnormal EKG. She was though to be experiencing an exacerbation of CHF and was referred to [name] for further evaluation of symptoms. Her BNP 3708.9 and glucose 382, she was admitted for acute on chronic heart failure, received  diuresis with IV furosemide with approximately 4 liters of fluid removed. She reports while in the hospital her blood sugars were erratic and she had an episode of hypoglycemia during her impatient stay. Since being home she reports readings during the day 100-120 fasting in morning. She had one reading in 300's Wednesday and associates this reading with eating something high in carbs. Her blood pressure Young monitored twice weekly by a nurse that comes to her home and she reports that blood pressure readings have been stable since discharge. Amber Young requesting today to discontinue the clonidine as it makes her significantly dizzy. She was recently placed on this medication for uncontrolled blood pressure readings.   Social History   Social History  . Marital status: Married    Spouse name: N/A  . Number of children: N/A  . Years of education: 65   Occupational History  . hairdresser    Social History Main Topics  . Smoking status: Never Smoker  . Smokeless tobacco: Never Used  . Alcohol use No  . Drug use: No  . Sexual activity: Yes   Other Topics Concern  . Not on file   Social History Narrative   Married, 2 children, hairdresser   Caffeine use- occasional tea or coffee   Right  handed       Family History  Problem Relation Age of Onset  . Diabetes Mother   . Hypertension Mother   . COPD Mother   . Heart failure Mother   . Hypertension Sister   . Hypertension Brother    Review of Systems See history of present illness Patient Active Problem List   Diagnosis Date Noted  . Chronic anticoagulation 01/06/2017  . Non compliance w medication regimen 01/06/2017  . Breast abscess 12/20/2016  . Encounter for therapeutic drug monitoring 05/29/2016  . CAD S/P PCI mLAD DES (Promus 3.0 x 24 -- 3.25 mm) 05/19/2016  . PAF (paroxysmal atrial fibrillation) ([place]) 05/19/2016  . Cardiomyopathy, ischemic     Class: Diagnosis of  . Microcytic anemia 04/26/2016  . Acute on chronic combined systolic and diastolic CHF (congestive heart failure) ([place]) 11/14/2015  . Hypokalemia 11/14/2015  . Hypertensive emergency   . Uncontrolled hypertension 09/30/2014  . Uncontrolled diabetes mellitus ([place]) 09/30/2014  . History of stroke 09/30/2014    Allergies  Allergen Reactions  . Ace Inhibitors Cough    Prior to Admission medications   Medication Sig Start Date End Date Taking? Authorizing Provider  acetaminophen (TYLENOL) 325 MG tablet Take 650 mg by mouth every 6 (six) hours as needed for mild pain.   Yes [provider]  atorvastatin (LIPITOR) 40 MG tablet Take 1 tablet (40 mg total) by mouth daily at 6 PM. 01/05/17  Yes Scot Jun,  FNP  carvedilol (COREG) 25 MG tablet Take 1 tablet (25 mg total) by mouth 2 (two) times daily. 12/25/16  Yes Patrecia Pour, Christean Grief, MD  cloNIDine (CATAPRES) 0.1 MG tablet Take 1 tablet (0.1 mg total) by mouth 2 (two) times daily. 01/05/17  Yes Scot Jun, FNP  feeding supplement, GLUCERNA SHAKE, (GLUCERNA SHAKE) LIQD Take 237 mLs by mouth 3 (three) times daily between meals. 02/11/17  Yes Hosie Poisson, MD  ferrous sulfate (FERROUSUL) 325 (65 FE) MG tablet Take 1 tablet (325 mg total) by mouth daily with breakfast. 01/22/17  Yes  Scot Jun, FNP  furosemide (LASIX) 40 MG tablet Take 1 tablet (40 mg total) by mouth daily. 01/06/17 04/06/17 Yes Kilroy, Luke K, PA-C  Glucose Blood (BLOOD GLUCOSE TEST STRIPS) STRP Use as directed 02/11/17  Yes Hosie Poisson, MD  insulin glargine (LANTUS) 100 UNIT/ML injection Inject 0.3 mLs (30 Units total) into the skin at bedtime. 02/16/17  Yes Jegede, Olugbemiga E, MD  insulin lispro (HUMALOG) 100 UNIT/ML injection Inject 0.05 mLs (5 Units total) into the skin 3 (three) times daily with meals. CBG 70 - 120: 0 units CBG 121 - 150: 3 units CBG 151 - 200: 4 units CBG 201 - 250: 7 units CBG 251 - 300: 11 units CBG 301 - 350: 15 units CBG 351 - 400: 20 units 02/16/17  Yes Jegede, Olugbemiga E, MD  Insulin Pen Needle 29G X 10MM MISC Use as directed 11/17/15  Yes Kathie Dike, MD  INSULIN SYRINGE .5CC/29G 29G X 1/2" 0.5 ML MISC Use to administer insulin three times daily 01/05/17  Yes Scot Jun, FNP  losartan (COZAAR) 50 MG tablet Take 1 tablet (50 mg total) by mouth daily. 01/18/17  Yes Scot Jun, FNP  potassium chloride SA (K-DUR,KLOR-CON) 20 MEQ tablet Take 2 tablets (40 mEq total) by mouth daily. Patient taking differently: Take 20 mEq by mouth daily.  01/22/17  Yes Scot Jun, FNP  ranitidine (ZANTAC) 150 MG tablet Take 1 tablet (150 mg total) by mouth 2 (two) times daily. 01/18/17  Yes Scot Jun, FNP  ticagrelor (BRILINTA) 90 MG TABS tablet Take 1 tablet (90 mg total) by mouth 2 (two) times daily. 02/16/17  Yes Tresa Garter, MD  warfarin (COUMADIN) 5 MG tablet Take 1 tablet (5 mg total) by mouth daily at 6 PM. Patient taking differently: Take 7.5 mg by mouth daily at 6 PM.  01/05/17  Yes Scot Jun, FNP    Past Medical, Surgical Family and Social History reviewed and updated.    Objective:   Today's Vitals   02/23/17 1501  BP: (!) 146/60  Pulse: (!) 57  Resp: 14  Temp: 97.6 F (36.4 C)  TempSrc: Oral  SpO2: 100%  Weight: 159 lb  6.4 oz (72.3 kg)  Height: 5\' 6"  (1.676 m)    Wt Readings from Last 3 Encounters:  02/23/17 159 lb 6.4 oz (72.3 kg)  02/11/17 152 lb 14.4 oz (69.4 kg)  02/08/17 161 lb (73 kg)    Physical Exam  Constitutional: She Young oriented to person, place, and time. She appears well-nourished.  HENT:  Head: Normocephalic and atraumatic.  Eyes: Pupils are equal, round, and reactive to light. Conjunctivae are normal.  Neck: Normal range of motion. Neck supple.  Cardiovascular: Normal rate, regular rhythm, normal heart sounds and intact distal pulses.   Pulmonary/Chest: Effort normal and breath sounds normal. She has no wheezes. She has no rales. She exhibits no tenderness.  Musculoskeletal: Normal range of motion.  Trace edema in lower extremities present.  Neurological: She Young alert and oriented to person, place, and time.  Skin: Skin Young warm and dry.  Psychiatric: She has a normal mood and affect. Her behavior Young normal. Judgment and thought content normal.   Assessment & Plan:  1. Chronic combined systolic and diastolic heart failure (HCC) -Continue medications as prescribed. -Keep follow-up appointments with cardiology and anticoagulant clinic  2. Essential hypertension -Continue all other medication as prescribed. -Discontinue clonidine -Start amlodipine 5 mg daily at bedtime  3. Diabetes mellitus due to underlying condition, uncontrolled, with hyperosmolarity without coma, with long-term current use of insulin (HCC) -Continue Lantus 20 units daily at bedtime -Continue Humalog and sliding scale insulin which will require 3 blood sugar checks daily.  Orders placed this encounter - COMPLETE METABOLIC PANEL WITH GFR - CBC with Differential  Return for follow-up in 6 weeks, for evaluation of blood sugar and hypertension.  Carroll Sage. [name], MSN, FNP-C [facility]  [name] [place], [place] [address] [phone]

## 2017-02-25 ENCOUNTER — Ambulatory Visit (INDEPENDENT_AMBULATORY_CARE_PROVIDER_SITE_OTHER): Payer: Self-pay | Admitting: Pharmacist Clinician (PhC)/ Clinical Pharmacy Specialist

## 2017-02-25 DIAGNOSIS — I639 Cerebral infarction, unspecified: Secondary | ICD-10-CM

## 2017-02-25 DIAGNOSIS — I4891 Unspecified atrial fibrillation: Secondary | ICD-10-CM

## 2017-02-25 DIAGNOSIS — Z5181 Encounter for therapeutic drug level monitoring: Secondary | ICD-10-CM

## 2017-02-25 DIAGNOSIS — I480 Paroxysmal atrial fibrillation: Secondary | ICD-10-CM

## 2017-02-25 LAB — POCT INR: INR: 3.9

## 2017-02-26 MED FILL — LOSARTAN POTASSIUM 50 MG TA: 50 | 30 days supply | Qty: 60 | Fill #0

## 2017-02-26 MED FILL — AMLODIPINE BESYLATE 5 MG TA: 5 | 30 days supply | Qty: 30 | Fill #0

## 2017-03-01 ENCOUNTER — Ambulatory Visit: Payer: Self-pay | Admitting: Family Medicine

## 2017-03-10 MED FILL — ?ATORVASTATIN 40MG TABLET: 40 | 30 days supply | Qty: 30 | Fill #1

## 2017-03-10 MED FILL — LANTUS SOLOSTAR 100 UNITS/M: 100 | 30 days supply | Qty: 6 | Fill #1

## 2017-03-23 ENCOUNTER — Telehealth: Payer: Self-pay | Admitting: Cardiovascular Disease

## 2017-03-23 MED FILL — ?WARFARIN SODIUM 5 MG TABLE: 5 | 30 days supply | Qty: 30 | Fill #2

## 2017-03-23 MED FILL — AMLODIPINE BESYLATE 5 MG TA: 5 | 30 days supply | Qty: 30 | Fill #1

## 2017-03-23 NOTE — Telephone Encounter (Signed)
Pt is needing to have surgery on 03/30/17 --she's needing to come off her blood thinners per [facility] Triage nurse @ Dr. Gala Lewandowsky [facility] office --[phone]

## 2017-03-23 NOTE — Telephone Encounter (Signed)
Will forward to Dr. Bronson Ing and Edrick Oh, RN for review.

## 2017-03-23 NOTE — Telephone Encounter (Signed)
Pt is a [facility] coumadin patient.  Forwarded to [employer].

## 2017-03-23 NOTE — Telephone Encounter (Signed)
Patient with diagnosis of AF on warfarin for anticoagulation.    Procedure: unknown "surgery" Date of procedure: 03/30/17  CHADS2 score of 5 (CHF, HTN, DM2, stroke/tia x 2);  CHADS2-VASc score of  7 (CHF, HTN, DM2, stroke/tia x 2, CAD, female)  CrCl 60.4 Platelet count 324  Per office protocol, patient can hold warfarin for 5 days prior to procedure.   Patient will need bridging with Lovenox (enoxaparin) around procedure.

## 2017-03-24 ENCOUNTER — Ambulatory Visit (INDEPENDENT_AMBULATORY_CARE_PROVIDER_SITE_OTHER): Payer: Self-pay | Admitting: Cardiovascular Disease

## 2017-03-24 ENCOUNTER — Encounter: Payer: Self-pay | Admitting: Cardiovascular Disease

## 2017-03-24 VITALS — BP 130/58 | HR 55 | Wt 171.0 lb

## 2017-03-24 DIAGNOSIS — I5042 Chronic combined systolic (congestive) and diastolic (congestive) heart failure: Secondary | ICD-10-CM

## 2017-03-24 DIAGNOSIS — I779 Disorder of arteries and arterioles, unspecified: Secondary | ICD-10-CM

## 2017-03-24 DIAGNOSIS — Z5181 Encounter for therapeutic drug level monitoring: Secondary | ICD-10-CM

## 2017-03-24 DIAGNOSIS — Z8673 Personal history of transient ischemic attack (TIA), and cerebral infarction without residual deficits: Secondary | ICD-10-CM

## 2017-03-24 DIAGNOSIS — Z7901 Long term (current) use of anticoagulants: Secondary | ICD-10-CM

## 2017-03-24 DIAGNOSIS — I480 Paroxysmal atrial fibrillation: Secondary | ICD-10-CM

## 2017-03-24 DIAGNOSIS — I25118 Atherosclerotic heart disease of native coronary artery with other forms of angina pectoris: Secondary | ICD-10-CM

## 2017-03-24 DIAGNOSIS — Z955 Presence of coronary angioplasty implant and graft: Secondary | ICD-10-CM

## 2017-03-24 DIAGNOSIS — I739 Peripheral vascular disease, unspecified: Secondary | ICD-10-CM

## 2017-03-24 DIAGNOSIS — I10 Essential (primary) hypertension: Secondary | ICD-10-CM

## 2017-03-24 NOTE — Telephone Encounter (Signed)
Hi Kristin. This is your patient.  Please contact pt with instructions if you haven't already. Thanks, Lattie Haw

## 2017-03-24 NOTE — Telephone Encounter (Signed)
Patient was not cleared for surgery on Aug 21, has not been a year since her stent placement.  She will need to re-schedule surgery until after October 9.   Patient knows to call us once a date has been set.

## 2017-03-24 NOTE — Progress Notes (Signed)
SUBJECTIVE: The patient presents for routine follow-up. She was hospitalized for acute on chronic combined systolic and diastolic heart failure in July 2018. It was thought secondary to noncompliance with diet and fluid intake. She has a history of accelerated hypertension, CVA, paroxysmal atrial fibrillation, and coronary artery disease with drug-eluting stent placement to the LAD in October 2017.  Weight is up 12 pounds from 02/23/2017.  From a cardiac standpoint she is doing well and denies chest pain, leg swelling, orthopnea, and shortness of breath. She said she does okay for the most part in avoiding sodium rich foods.  Her primary problem lately has been related to hypoglycemic episodes causing episodic dizziness.    Review of Systems: As per "subjective", otherwise negative.  Allergies  Allergen Reactions  . Ace Inhibitors Cough    Current Outpatient Prescriptions  Medication Sig Dispense Refill  . amLODipine (NORVASC) 5 MG tablet Take 1 tablet (5 mg total) by mouth daily. 90 tablet 3  . atorvastatin (LIPITOR) 40 MG tablet Take 1 tablet (40 mg total) by mouth daily at 6 PM. 90 tablet 1  . carvedilol (COREG) 25 MG tablet Take 1 tablet (25 mg total) by mouth 2 (two) times daily. 60 tablet 0  . feeding supplement, GLUCERNA SHAKE, (GLUCERNA SHAKE) LIQD Take 237 mLs by mouth 3 (three) times daily between meals.  0  . ferrous sulfate (FERROUSUL) 325 (65 FE) MG tablet Take 1 tablet (325 mg total) by mouth daily with breakfast. 90 tablet 3  . furosemide (LASIX) 40 MG tablet Take 1 tablet (40 mg total) by mouth daily. 90 tablet 3  . Glucose Blood (BLOOD GLUCOSE TEST STRIPS) STRP Use as directed 200 each 0  . insulin glargine (LANTUS) 100 UNIT/ML injection Inject 0.2 mLs (20 Units total) into the skin at bedtime. 30 mL 3  . insulin lispro (HUMALOG) 100 UNIT/ML injection Inject 0.05 mLs (5 Units total) into the skin 3 (three) times daily with meals. CBG 70 - 120: 0 units CBG 121 -  150: 3 units CBG 151 - 200: 4 units CBG 201 - 250: 7 units CBG 251 - 300: 11 units CBG 301 - 350: 15 units CBG 351 - 400: 20 units 30 mL 3  . Insulin Pen Needle 29G X 10MM MISC Use as directed 100 each 0  . INSULIN SYRINGE .5CC/29G 29G X 1/2" 0.5 ML MISC Use to administer insulin three times daily 200 each 5  . losartan (COZAAR) 50 MG tablet Take 1 tablet (50 mg total) by mouth 2 (two) times daily. 90 tablet 3  . ticagrelor (BRILINTA) 90 MG TABS tablet Take 1 tablet (90 mg total) by mouth 2 (two) times daily. 180 tablet 3  . warfarin (COUMADIN) 5 MG tablet Take 1 tablet (5 mg total) by mouth daily at 6 PM. (Patient taking differently: Take 7.5 mg by mouth daily at 6 PM. ) 30 tablet 3   No current facility-administered medications for this visit.     Past Medical History:  Diagnosis Date  . Anemia   . CHF (congestive heart failure) ([place])   . Coronary artery disease   . GERD (gastroesophageal reflux disease)   . Heart murmur   . Hyperlipidemia   . Hypertension   . Stroke ([place]) 09/2014   numbness left upper lip, finger tips on left hand; "resolved" (05/18/2016)  . Type II diabetes mellitus ([place])     Past Surgical History:  Procedure Laterality Date  . CARDIAC CATHETERIZATION N/A 05/18/2016  Procedure: Left Heart Cath and Coronary Angiography;  Surgeon: Jettie Booze, MD;  Location: [facility];  Service: Cardiovascular;  Laterality: N/A;  . CARDIAC CATHETERIZATION N/A 05/18/2016   Procedure: Coronary Stent Intervention;  Surgeon: Jettie Booze, MD;  Location: [facility];  Service: Cardiovascular;  Laterality: N/A;  . CORONARY ANGIOPLASTY    . TUBAL LIGATION  1980  . VAGINAL HYSTERECTOMY  1999   "partial; fibroids"    Social History   Social History  . Marital status: Married    Spouse name: N/A  . Number of children: N/A  . Years of education: 75   Occupational History  . hairdresser    Social History Main Topics  . Smoking status: Never  Smoker  . Smokeless tobacco: Never Used  . Alcohol use No  . Drug use: No  . Sexual activity: Yes   Other Topics Concern  . Not on file   Social History Narrative   Married, 2 children, hairdresser   Caffeine use- occasional tea or coffee   Right handed        Vitals:   03/24/17 1034  BP: (!) 130/58  Pulse: (!) 55  SpO2: 98%  Weight: 171 lb (77.6 kg)    Wt Readings from Last 3 Encounters:  03/24/17 171 lb (77.6 kg)  02/23/17 159 lb 6.4 oz (72.3 kg)  02/11/17 152 lb 14.4 oz (69.4 kg)     PHYSICAL EXAM General: NAD HEENT: Normal. Neck: No JVD, no thyromegaly. Lungs: Clear to auscultation bilaterally with normal respiratory effort. CV: Nondisplaced PMI.  Regular rate and rhythm, normal S1/S2, no S3/S4, no murmur. No pretibial or periankle edema.  Left carotid bruit.   Abdomen: Soft, nontender, no distention.  Neurologic: Alert and oriented.  Psych: Normal affect. Skin: Normal. Musculoskeletal: No gross deformities.    ECG: Most recent ECG reviewed.   Labs: Lab Results  Component Value Date/Time   K 4.8 02/23/2017 04:02 PM   K 4.8 02/23/2017 04:02 PM   BUN 31 (H) 02/23/2017 04:02 PM   BUN 31 (H) 02/23/2017 04:02 PM   CREATININE 1.13 (H) 02/23/2017 04:02 PM   CREATININE 1.13 (H) 02/23/2017 04:02 PM   ALT 35 (H) 02/23/2017 04:02 PM   TSH 1.34 01/05/2017 11:46 AM   HGB 9.5 (L) 02/23/2017 04:02 PM     Lipids: Lab Results  Component Value Date/Time   LDLCALC 95 01/05/2017 11:46 AM   CHOL 190 01/05/2017 11:46 AM   TRIG 74 01/05/2017 11:46 AM   HDL 80 01/05/2017 11:46 AM       ASSESSMENT AND PLAN: 1. Chronic combined systolic and diastolic heart failure: Weight is up 12 pounds from 02/23/2017 but this appears to be due to increased dietary intake rather than fluid retention. She is currently on Lasix 40 mg daily. Continue carvedilol and losartan.  2. Coronary artery disease: Currently on carvedilol, Brilinta, and Lipitor. Will reduce Brilinta to 60 mg  twice daily after October 9. With respect to surgical procedures, I would prefer to have her wait until a year has elapsed since drug-eluting stent placement at which point Brilinta can be temporarily held.  3. Malignant hypertension: Controlled. No changes.  4. Carotid artery disease with LAD stent: Carotid Dopplers showed moderate left-sided plaque disease with no hemodynamically significant stenosis.  5. Paroxysmal atrial fibrillation and atrial tachycardia: Currently on carvedilol. Anticoagulated with warfarin. INR was supratherapeutic at 3.9 on 02/25/17. Monitored in our anticoagulation clinic in [place].  Disposition: Follow up 6 months.   Kate Sable, M.D., F.A.C.C.

## 2017-03-24 NOTE — Patient Instructions (Signed)

## 2017-03-25 ENCOUNTER — Ambulatory Visit (INDEPENDENT_AMBULATORY_CARE_PROVIDER_SITE_OTHER): Payer: Self-pay | Admitting: Pharmacist

## 2017-03-25 ENCOUNTER — Telehealth: Payer: Self-pay | Admitting: Cardiovascular Disease

## 2017-03-25 DIAGNOSIS — I480 Paroxysmal atrial fibrillation: Secondary | ICD-10-CM

## 2017-03-25 DIAGNOSIS — I4891 Unspecified atrial fibrillation: Secondary | ICD-10-CM

## 2017-03-25 DIAGNOSIS — I639 Cerebral infarction, unspecified: Secondary | ICD-10-CM

## 2017-03-25 DIAGNOSIS — Z5181 Encounter for therapeutic drug level monitoring: Secondary | ICD-10-CM

## 2017-03-25 LAB — PROTIME-INR
INR: 6.9 (ref 0.8–1.2)
Prothrombin Time: 73.2 s — ABNORMAL HIGH (ref 9.1–12.0)

## 2017-03-25 LAB — POCT INR: INR: 8

## 2017-03-25 NOTE — Telephone Encounter (Signed)
Spoke with Kennyth Lose w/stat lab for [facility] PT 73.2 INR 6.9  Routed to Raquel

## 2017-03-25 NOTE — Telephone Encounter (Signed)
Thanks for the update. Patient seen in clinic today. See anti-coagulation noted

## 2017-03-25 NOTE — Telephone Encounter (Signed)
New message    Amber Young is calling with stat lab results.

## 2017-03-30 ENCOUNTER — Encounter (HOSPITAL_BASED_OUTPATIENT_CLINIC_OR_DEPARTMENT_OTHER): Admission: RE | Payer: Self-pay | Source: Ambulatory Visit

## 2017-03-30 ENCOUNTER — Ambulatory Visit (INDEPENDENT_AMBULATORY_CARE_PROVIDER_SITE_OTHER): Payer: Self-pay | Admitting: Pharmacist

## 2017-03-30 ENCOUNTER — Ambulatory Visit (HOSPITAL_BASED_OUTPATIENT_CLINIC_OR_DEPARTMENT_OTHER): Admission: RE | Admit: 2017-03-30 | Payer: Self-pay | Source: Ambulatory Visit | Admitting: Surgery

## 2017-03-30 DIAGNOSIS — Z5181 Encounter for therapeutic drug level monitoring: Secondary | ICD-10-CM

## 2017-03-30 DIAGNOSIS — I4891 Unspecified atrial fibrillation: Secondary | ICD-10-CM

## 2017-03-30 DIAGNOSIS — I480 Paroxysmal atrial fibrillation: Secondary | ICD-10-CM

## 2017-03-30 DIAGNOSIS — I639 Cerebral infarction, unspecified: Secondary | ICD-10-CM

## 2017-03-30 LAB — POCT INR: INR: 1.4

## 2017-03-30 SURGERY — EXCISION MASS
Anesthesia: General

## 2017-04-09 MED FILL — LOSARTAN POTASSIUM 50 MG TA: 50 | 30 days supply | Qty: 60 | Fill #1

## 2017-04-13 ENCOUNTER — Ambulatory Visit (INDEPENDENT_AMBULATORY_CARE_PROVIDER_SITE_OTHER): Payer: Self-pay | Admitting: Pharmacist

## 2017-04-13 DIAGNOSIS — I639 Cerebral infarction, unspecified: Secondary | ICD-10-CM

## 2017-04-13 DIAGNOSIS — I4891 Unspecified atrial fibrillation: Secondary | ICD-10-CM

## 2017-04-13 DIAGNOSIS — I480 Paroxysmal atrial fibrillation: Secondary | ICD-10-CM

## 2017-04-13 DIAGNOSIS — Z5181 Encounter for therapeutic drug level monitoring: Secondary | ICD-10-CM

## 2017-04-13 LAB — POCT INR: INR: 3.6

## 2017-04-20 ENCOUNTER — Ambulatory Visit: Payer: Self-pay | Admitting: Neurology

## 2017-04-26 ENCOUNTER — Ambulatory Visit (INDEPENDENT_AMBULATORY_CARE_PROVIDER_SITE_OTHER): Payer: Self-pay | Admitting: Family Medicine

## 2017-04-26 ENCOUNTER — Encounter: Payer: Self-pay | Admitting: Family Medicine

## 2017-04-26 VITALS — BP 160/94 | HR 80 | Temp 98.2°F | Resp 14 | Ht 66.0 in | Wt 165.0 lb

## 2017-04-26 DIAGNOSIS — I509 Heart failure, unspecified: Secondary | ICD-10-CM

## 2017-04-26 DIAGNOSIS — E118 Type 2 diabetes mellitus with unspecified complications: Secondary | ICD-10-CM

## 2017-04-26 DIAGNOSIS — D638 Anemia in other chronic diseases classified elsewhere: Secondary | ICD-10-CM

## 2017-04-26 DIAGNOSIS — N183 Chronic kidney disease, stage 3 (moderate): Secondary | ICD-10-CM

## 2017-04-26 DIAGNOSIS — Z794 Long term (current) use of insulin: Secondary | ICD-10-CM

## 2017-04-26 DIAGNOSIS — I10 Essential (primary) hypertension: Secondary | ICD-10-CM

## 2017-04-26 DIAGNOSIS — E1122 Type 2 diabetes mellitus with diabetic chronic kidney disease: Secondary | ICD-10-CM

## 2017-04-26 LAB — POCT URINALYSIS DIP (DEVICE)
BILIRUBIN URINE: NEGATIVE
Glucose, UA: 100 mg/dL — AB
KETONES UR: NEGATIVE mg/dL
NITRITE: NEGATIVE
Protein, ur: >=300 mg/dL — AB
Specific Gravity, Urine: 1.02 (ref 1.005–1.030)
Urobilinogen, UA: 0.2 mg/dL (ref 0.0–1.0)
pH: 6.5 (ref 5.0–8.0)

## 2017-04-26 LAB — POCT GLYCOSYLATED HEMOGLOBIN (HGB A1C): HEMOGLOBIN A1C: 10.3

## 2017-04-26 MED ORDER — ATORVASTATIN CALCIUM 40 MG PO TABS: 40.0000 mg | ORAL_TABLET | Freq: Every day | ORAL | 1 refills | Status: DC

## 2017-04-26 MED ORDER — AMLODIPINE BESYLATE 10 MG PO TABS: 10.0000 mg | ORAL_TABLET | Freq: Every day | ORAL | 2 refills | Status: DC

## 2017-04-26 MED ORDER — CLONIDINE HCL 0.1 MG PO TABS
0.2000 mg | ORAL_TABLET | Freq: Once | ORAL | Status: AC
  Administered 2017-04-26: 0.2 mg via ORAL

## 2017-04-26 MED FILL — ?ATORVASTATIN 40MG TABLET: 40 | 30 days supply | Qty: 30 | Fill #0

## 2017-04-26 MED FILL — AMLODIPINE BESYLATE 10 MG T: 10 | 30 days supply | Qty: 30 | Fill #0

## 2017-04-26 NOTE — Patient Instructions (Addendum)
Check your blood sugar at night prior administering Lantus and if blood sugar is less than 150, only administer 10 units and eat a snack high in protein. If night- time blood sugar is greater than 150, administer 20 units and eat a bedtime snack. Snack should be high in protein.  Increased Amlodipine 10 mg once daily for blood pressure control. Medication is ready for pick up at pharmacy.    Protein Content in Foods Generally, most healthy people need around 50 grams of protein each day. Depending on your overall health, you may need more or less protein in your diet. Talk to your health care provider or dietitian about how much protein you need. See the following list for the protein content of some common foods. High-protein foods High-protein foods contain 4 grams (4 g) or more of protein per serving. They include:  Beef, ground sirloin (cooked) - 3 oz have 24 g of protein.  Cheese (hard) - 1 oz has 7 g of protein.  Chicken breast, boneless and skinless (cooked) - 3 oz have 13.4 g of protein.  Cottage cheese - 1/2 cup has 13.4 g of protein.  Egg - 1 egg has 6 g of protein.  Fish, filet (cooked) - 1 oz has 6-7 g of protein.  Garbanzo beans (canned or cooked) - 1/2 cup has 6-7 g of protein.  Kidney beans (canned or cooked) - 1/2 cup has 6-7 g of protein.  Lamb (cooked) - 3 oz has 24 g of protein.  Milk - 1 cup (8 oz) has 8 g of protein.  Nuts (peanuts, pistachios, almonds) - 1 oz has 6 g of protein.  Peanut butter - 1 oz has 7-8 g of protein.  Pork tenderloin (cooked) - 3 oz has 18.4 g of protein.  Pumpkin seeds - 1 oz has 8.5 g of protein.  Soybeans (roasted) - 1 oz has 8 g of protein.  Soybeans (cooked) - 1/2 cup has 11 g of protein.  Soy milk - 1 cup (8 oz) has 5-10 g of protein.  Soy or vegetable patty - 1 patty has 11 g of protein.  Sunflower seeds - 1 oz has 5.5 g of protein.  Tofu (firm) - 1/2 cup has 20 g of protein.  Tuna (canned in water) - 3 oz has 20  g of protein.  Yogurt - 6 oz has 8 g of protein.  Low-protein foods Low-protein foods contain 3 grams (3 g) or less of protein per serving. They include:  Beets (raw or cooked) - 1/2 cup has 1.5 g of protein.  Bran cereal - 1/2 cup has 2-3 g of protein.  Bread - 1 slice has 2.5 g of protein.  Broccoli (raw or cooked) - 1/2 cup has 2 g of protein.  Collard greens (raw or cooked) - 1/2 cup has 2 g of protein.  Corn (fresh or cooked) - 1/2 cup has 2 g of protein.  Cream cheese - 1 oz has 2 g of protein.  Creamer (half-and-half) - 1 oz has 1 g of protein.  Flour tortilla - 1 tortilla has 2.5 g of protein  Frozen yogurt - 1/2 cup has 3 g of protein.  Fruit or vegetable juice - 1/2 cup has 1 g of protein.  Green beans (raw or cooked) - 1/2 cup has 1 g of protein.  Green peas (canned) - 1/2 cup has 3.5 g of protein.  Muffins - 1 small muffin (2 oz) has 3 g of protein.  Oatmeal (cooked) - 1/2 cup has 3 g of protein.  Potato (baked with skin) - 1 medium potato has 3 g of protein.  Rice (cooked) - 1/2 cup has 2.5-3.5 g of protein.  Sour cream - 1/2 cup has 2.5 g of protein.  Spinach (cooked) - 1/2 cup has 3 g of protein.  Squash (cooked) - 1/2 cup has 1.5 g of protein.  Actual amounts of protein may be different depending on processing. Talk with your health care provider or dietitian about what foods are recommended for you. This information is not intended to replace advice given to you by your health care provider. Make sure you discuss any questions you have with your health care provider. Document Released: 10/26/2015 Document Revised: 04/06/2016 Document Reviewed: 04/06/2016 Elsevier Interactive Patient Education  [employer].

## 2017-04-26 NOTE — Progress Notes (Signed)
Patient ID: Amber Young, female    DOB: [birth year], 61 y.o.   MRN: [id number]  PCP: Scot Jun, FNP  Chief Complaint  Patient presents with  . Follow-up    6 MONTH  . Back Pain    Subjective:  HPI Amber Young is a 61 y.o. female with uncontrolled diabetes, stage 2 chronic kidney disease, heart failure, uncontrolled hypertension presents chronic disease management follow-up and evaluation back pain. Amber Young reports that overall she felt well since her last office visit. She complains of 3-5 lb weight gain which fluctuates weekly. Reports chronic shortness of breath which is most pronounced at bedtime. Amber Young complains of back pain which is most pronounced at bedtime and is worsened by dyspnea. She denies cough or lower extremity swelling. Last office visit with cardiology 03/24/2017 with Dr. Bronson Ing in which she was noted to have 12 pound weigh gain which was more related to increase dietary intake versus fluid retention. Amber Young also suffers from diabetes and reports occasional episodes of awakening with hypoglycemia with readings in 60's. She reports intake of a bedtime snack and admits to eating snacks like cracker and hot dogs prior to injecting long acting insulin. She is not checking her blood sugar prior to administering her night time long-acting insulin, therefore is not able to provide a baseline of readings which may have contributed to hypoglycemia. Last A1C 10.3. Current Body mass index is 26.63 kg/m. continues to be sedentary mostly and admits to not being completely compliant to a low sodium and fluid restricted diet. Social History   Social History  . Marital status: Married    Spouse name: N/A  . Number of children: N/A  . Years of education: 64   Occupational History  . hairdresser    Social History Main Topics  . Smoking status: Never Smoker  . Smokeless tobacco: Never Used  . Alcohol use No  . Drug use: No  . Sexual activity: Yes   Other  Topics Concern  . Not on file   Social History Narrative   Married, 2 children, hairdresser   Caffeine use- occasional tea or coffee   Right handed       Family History  Problem Relation Age of Onset  . Diabetes Mother   . Hypertension Mother   . COPD Mother   . Heart failure Mother   . Hypertension Sister   . Hypertension Brother    Review of Systems See HPI Patient Active Problem List   Diagnosis Date Noted  . Chronic anticoagulation 01/06/2017  . Non compliance w medication regimen 01/06/2017  . Breast abscess 12/20/2016  . Encounter for therapeutic drug monitoring 05/29/2016  . CAD S/P PCI mLAD DES (Promus 3.0 x 24 -- 3.25 mm) 05/19/2016  . PAF (paroxysmal atrial fibrillation) ([place]) 05/19/2016  . Cardiomyopathy, ischemic     Class: Diagnosis of  . Microcytic anemia 04/26/2016  . Acute on chronic combined systolic and diastolic CHF (congestive heart failure) ([place]) 11/14/2015  . Hypokalemia 11/14/2015  . Hypertensive emergency   . Uncontrolled hypertension 09/30/2014  . Uncontrolled diabetes mellitus ([place]) 09/30/2014  . History of stroke 09/30/2014    Allergies  Allergen Reactions  . Ace Inhibitors Cough    Prior to Admission medications   Medication Sig Start Date End Date Taking? Authorizing Provider  amLODipine (NORVASC) 5 MG tablet Take 1 tablet (5 mg total) by mouth daily. 02/23/17   Scot Jun, FNP  atorvastatin (LIPITOR) 40 MG tablet Take 1 tablet (40 mg total)  by mouth daily at 6 PM. 01/05/17   Scot Jun, FNP  carvedilol (COREG) 25 MG tablet Take 1 tablet (25 mg total) by mouth 2 (two) times daily. 12/25/16   Doreatha Lew, MD  feeding supplement, GLUCERNA SHAKE, (GLUCERNA SHAKE) LIQD Take 237 mLs by mouth 3 (three) times daily between meals. 02/11/17   Hosie Poisson, MD  ferrous sulfate (FERROUSUL) 325 (65 FE) MG tablet Take 1 tablet (325 mg total) by mouth daily with breakfast. 01/22/17   Scot Jun, FNP  furosemide (LASIX) 40  MG tablet Take 1 tablet (40 mg total) by mouth daily. 01/06/17 04/06/17  Erlene Quan, PA-C  Glucose Blood (BLOOD GLUCOSE TEST STRIPS) STRP Use as directed 02/11/17   Hosie Poisson, MD  insulin glargine (LANTUS) 100 UNIT/ML injection Inject 0.2 mLs (20 Units total) into the skin at bedtime. 02/23/17   Scot Jun, FNP  insulin lispro (HUMALOG) 100 UNIT/ML injection Inject 0.05 mLs (5 Units total) into the skin 3 (three) times daily with meals. CBG 70 - 120: 0 units CBG 121 - 150: 3 units CBG 151 - 200: 4 units CBG 201 - 250: 7 units CBG 251 - 300: 11 units CBG 301 - 350: 15 units CBG 351 - 400: 20 units 02/16/17   Tresa Garter, MD  Insulin Pen Needle 29G X 10MM MISC Use as directed 11/17/15   Kathie Dike, MD  INSULIN SYRINGE .5CC/29G 29G X 1/2" 0.5 ML MISC Use to administer insulin three times daily 01/05/17   Scot Jun, FNP  losartan (COZAAR) 50 MG tablet Take 1 tablet (50 mg total) by mouth 2 (two) times daily. 02/23/17   Scot Jun, FNP  ticagrelor (BRILINTA) 90 MG TABS tablet Take 1 tablet (90 mg total) by mouth 2 (two) times daily. 02/16/17   Tresa Garter, MD  warfarin (COUMADIN) 5 MG tablet Take 1 tablet (5 mg total) by mouth daily at 6 PM. Patient taking differently: Take 7.5 mg by mouth daily at 6 PM.  01/05/17   Scot Jun, FNP    Past Medical, Surgical Family and Social History reviewed and updated.    Objective:   Vitals:   04/26/17 1006  BP: (!) 160/94  Pulse: 80  Resp: 14  Temp: 98.2 F (36.8 C)  SpO2: 100%    Wt Readings from Last 3 Encounters:  03/24/17 171 lb (77.6 kg)  02/23/17 159 lb 6.4 oz (72.3 kg)  02/11/17 152 lb 14.4 oz (69.4 kg)    Physical Exam  Constitutional: She is oriented to person, place, and time. She appears well-developed and well-nourished.  HENT:  Head: Normocephalic and atraumatic.  Eyes: Pupils are equal, round, and reactive to light. Conjunctivae and EOM are normal.  Neck: Normal range of  motion. Neck supple.  Cardiovascular: Normal rate, regular rhythm, normal heart sounds and intact distal pulses.   Pulmonary/Chest: Effort normal and breath sounds normal.  Abdominal: Soft. Bowel sounds are normal. She exhibits no distension.  Musculoskeletal: Normal range of motion. She exhibits no edema.  Neurological: She is alert and oriented to person, place, and time.  Psychiatric: She has a normal mood and affect. Her behavior is normal. Judgment and thought content normal.   Assessment & Plan:  1. Chronic heart failure, unspecified heart failure type ([place]), stable, managed by cardiology. Will check BNP as patient is complaining of worsening PND. 2. Type 2 diabetes mellitus with complication, with long-term current use of insulin ([place]), A1C improved today  10.3 3. Essential hypertension, uncontrolled today-reviewed medications that are for blood pressure control, educated about dietary recommendations to aid in improving blood pressure. 4. CKD stage 3 due to type 2 diabetes mellitus ([place]), continue to monitor renal function and promote good glycemic control. 5. Anemia, chronic disease, will continue monitor anemia. Will defer oral replacement of iron presently as patient is currently anticoagulated with coumadin. Repeat TIBC at 4 week follow-up.   Orders Placed This Encounter  Procedures  . COMPLETE METABOLIC PANEL WITH GFR  . CBC with Differential  . Brain natriuretic peptide  . TIQ-NTM  . CBC with Differential  . Iron, TIBC and Ferritin Panel  . Renal Function Panel  . POCT glycosylated hemoglobin (Hb A1C)  . POCT urinalysis dip (device)    RTC: 4 weeks hypertension and chronic conditions  Carroll Sage. [name], MSN, FNP-C [facility]  [name] [place], [place] [address] [phone]

## 2017-04-27 ENCOUNTER — Telehealth: Payer: Self-pay | Admitting: Pharmacist Clinician (PhC)/ Clinical Pharmacy Specialist

## 2017-04-27 LAB — COMPLETE METABOLIC PANEL WITH GFR
AG Ratio: 1.1 (calc) (ref 1.0–2.5)
ALKALINE PHOSPHATASE (APISO): 225 U/L — AB (ref 33–130)
ALT: 19 U/L (ref 6–29)
AST: 17 U/L (ref 10–35)
Albumin: 3.5 g/dL — ABNORMAL LOW (ref 3.6–5.1)
BUN/Creatinine Ratio: 16 (calc) (ref 6–22)
BUN: 25 mg/dL (ref 7–25)
CALCIUM: 8.7 mg/dL (ref 8.6–10.4)
CO2: 27 mmol/L (ref 20–32)
CREATININE: 1.56 mg/dL — AB (ref 0.50–0.99)
Chloride: 103 mmol/L (ref 98–110)
GFR, EST NON AFRICAN AMERICAN: 35 mL/min/{1.73_m2} — AB (ref >=60)
GFR, Est African American: 41 mL/min/{1.73_m2} — ABNORMAL LOW (ref >=60)
GLOBULIN: 3.3 g/dL (ref 1.9–3.7)
Glucose, Bld: 246 mg/dL — ABNORMAL HIGH (ref 65–99)
POTASSIUM: 3.8 mmol/L (ref 3.5–5.3)
SODIUM: 139 mmol/L (ref 135–146)
Total Bilirubin: 0.4 mg/dL (ref 0.2–1.2)
Total Protein: 6.8 g/dL (ref 6.1–8.1)

## 2017-04-27 LAB — CBC WITH DIFFERENTIAL/PLATELET
BASOS PCT: 0.4 %
Basophils Absolute: 21 cells/uL (ref 0–200)
Eosinophils Absolute: 88 cells/uL (ref 15–500)
Eosinophils Relative: 1.7 %
HEMATOCRIT: 28.4 % — AB (ref 35.0–45.0)
Hemoglobin: 8.8 g/dL — ABNORMAL LOW (ref 11.7–15.5)
Lymphs Abs: 1544 cells/uL (ref 850–3900)
MCH: 24.1 pg — ABNORMAL LOW (ref 27.0–33.0)
MCHC: 31 g/dL — ABNORMAL LOW (ref 32.0–36.0)
MCV: 77.8 fL — ABNORMAL LOW (ref 80.0–100.0)
MONOS PCT: 11.7 %
MPV: 9.8 fL (ref 7.5–12.5)
NEUTROS ABS: 2938 {cells}/uL (ref 1500–7800)
Neutrophils Relative %: 56.5 %
PLATELETS: 297 10*3/uL (ref 140–400)
RBC: 3.65 10*6/uL — AB (ref 3.80–5.10)
RDW: 14.4 % (ref 11.0–15.0)
Total Lymphocyte: 29.7 %
WBC: 5.2 10*3/uL (ref 3.8–10.8)
WBCMIX: 608 {cells}/uL (ref 200–950)

## 2017-04-27 LAB — TIQ-NTM

## 2017-04-27 LAB — BRAIN NATRIURETIC PEPTIDE: Brain Natriuretic Peptide: 116 pg/mL — ABNORMAL HIGH (ref <100)

## 2017-04-27 NOTE — Telephone Encounter (Signed)
F/u Message  Pt call requesting to speak with Rn. Pt states she is returning RN call .please call back to discuss

## 2017-04-28 NOTE — Telephone Encounter (Signed)
Patient unable to make appointment on 04/30/2017.   Re-scheduled to 9/20 ay 1:30pm

## 2017-04-29 ENCOUNTER — Ambulatory Visit (INDEPENDENT_AMBULATORY_CARE_PROVIDER_SITE_OTHER): Payer: Self-pay | Admitting: Pharmacist

## 2017-04-29 DIAGNOSIS — Z7901 Long term (current) use of anticoagulants: Secondary | ICD-10-CM

## 2017-04-29 DIAGNOSIS — I480 Paroxysmal atrial fibrillation: Secondary | ICD-10-CM

## 2017-04-29 DIAGNOSIS — I4891 Unspecified atrial fibrillation: Secondary | ICD-10-CM

## 2017-04-29 DIAGNOSIS — Z5181 Encounter for therapeutic drug level monitoring: Secondary | ICD-10-CM

## 2017-04-29 DIAGNOSIS — I639 Cerebral infarction, unspecified: Secondary | ICD-10-CM

## 2017-04-29 LAB — POCT INR: INR: 2

## 2017-05-03 MED FILL — ?WARFARIN SODIUM 5 MG TABLE: 5 | 30 days supply | Qty: 30 | Fill #3

## 2017-05-18 ENCOUNTER — Ambulatory Visit (INDEPENDENT_AMBULATORY_CARE_PROVIDER_SITE_OTHER): Payer: Self-pay | Admitting: Pharmacist Clinician (PhC)/ Clinical Pharmacy Specialist

## 2017-05-18 DIAGNOSIS — I639 Cerebral infarction, unspecified: Secondary | ICD-10-CM

## 2017-05-18 DIAGNOSIS — I4891 Unspecified atrial fibrillation: Secondary | ICD-10-CM

## 2017-05-18 DIAGNOSIS — I480 Paroxysmal atrial fibrillation: Secondary | ICD-10-CM

## 2017-05-18 DIAGNOSIS — Z5181 Encounter for therapeutic drug level monitoring: Secondary | ICD-10-CM

## 2017-05-18 LAB — POCT INR: INR: 6.4

## 2017-05-18 LAB — PROTHROMBIN TIME + INR
INR: 4.8 — AB (ref 0.8–1.2)
Prothrombin Time: 45.4 s — ABNORMAL HIGH (ref 9.1–12.0)

## 2017-05-24 ENCOUNTER — Ambulatory Visit (INDEPENDENT_AMBULATORY_CARE_PROVIDER_SITE_OTHER): Payer: Self-pay | Admitting: Family Medicine

## 2017-05-24 ENCOUNTER — Encounter: Payer: Self-pay | Admitting: Family Medicine

## 2017-05-24 VITALS — BP 142/60 | HR 100 | Temp 98.2°F | Resp 16 | Ht 66.0 in | Wt 172.0 lb

## 2017-05-24 DIAGNOSIS — Z1231 Encounter for screening mammogram for malignant neoplasm of breast: Secondary | ICD-10-CM

## 2017-05-24 DIAGNOSIS — I10 Essential (primary) hypertension: Secondary | ICD-10-CM

## 2017-05-24 DIAGNOSIS — D638 Anemia in other chronic diseases classified elsewhere: Secondary | ICD-10-CM

## 2017-05-24 DIAGNOSIS — M545 Low back pain, unspecified: Secondary | ICD-10-CM

## 2017-05-24 DIAGNOSIS — E1122 Type 2 diabetes mellitus with diabetic chronic kidney disease: Secondary | ICD-10-CM

## 2017-05-24 DIAGNOSIS — N644 Mastodynia: Secondary | ICD-10-CM

## 2017-05-24 DIAGNOSIS — N183 Chronic kidney disease, stage 3 (moderate): Secondary | ICD-10-CM

## 2017-05-24 DIAGNOSIS — Z1239 Encounter for other screening for malignant neoplasm of breast: Secondary | ICD-10-CM

## 2017-05-24 LAB — CBC WITH DIFFERENTIAL/PLATELET
BASOS PCT: 0.5 %
Basophils Absolute: 29 cells/uL (ref 0–200)
EOS ABS: 103 {cells}/uL (ref 15–500)
Eosinophils Relative: 1.8 %
HEMATOCRIT: 27.1 % — AB (ref 35.0–45.0)
Hemoglobin: 8.6 g/dL — ABNORMAL LOW (ref 11.7–15.5)
LYMPHS ABS: 1573 {cells}/uL (ref 850–3900)
MCH: 24.2 pg — AB (ref 27.0–33.0)
MCHC: 31.7 g/dL — ABNORMAL LOW (ref 32.0–36.0)
MCV: 76.3 fL — AB (ref 80.0–100.0)
MPV: 9.6 fL (ref 7.5–12.5)
Monocytes Relative: 12.7 %
NEUTROS PCT: 57.4 %
Neutro Abs: 3272 cells/uL (ref 1500–7800)
Platelets: 328 10*3/uL (ref 140–400)
RBC: 3.55 10*6/uL — ABNORMAL LOW (ref 3.80–5.10)
RDW: 13.9 % (ref 11.0–15.0)
TOTAL LYMPHOCYTE: 27.6 %
WBC: 5.7 10*3/uL (ref 3.8–10.8)
WBCMIX: 724 {cells}/uL (ref 200–950)

## 2017-05-24 LAB — POCT URINALYSIS DIP (DEVICE)
BILIRUBIN URINE: NEGATIVE
Glucose, UA: 250 mg/dL — AB
KETONES UR: NEGATIVE mg/dL
Leukocytes, UA: NEGATIVE
Nitrite: NEGATIVE
PH: 6 (ref 5.0–8.0)
SPECIFIC GRAVITY, URINE: 1.02 (ref 1.005–1.030)
Urobilinogen, UA: 0.2 mg/dL (ref 0.0–1.0)

## 2017-05-24 LAB — IRON,TIBC AND FERRITIN PANEL
%SAT: 13 % (ref 11–50)
FERRITIN: 92 ng/mL (ref 20–288)
Iron: 40 ug/dL — ABNORMAL LOW (ref 45–160)
TIBC: 308 mcg/dL (calc) (ref 250–450)

## 2017-05-24 LAB — RENAL FUNCTION PANEL
ALBUMIN MSPROF: 3.4 g/dL — AB (ref 3.6–5.1)
BUN/Creatinine Ratio: 20 (calc) (ref 6–22)
BUN: 27 mg/dL — AB (ref 7–25)
CALCIUM: 8.8 mg/dL (ref 8.6–10.4)
CO2: 29 mmol/L (ref 20–32)
Chloride: 107 mmol/L (ref 98–110)
Creat: 1.38 mg/dL — ABNORMAL HIGH (ref 0.50–0.99)
Glucose, Bld: 133 mg/dL — ABNORMAL HIGH (ref 65–99)
PHOSPHORUS: 4.5 mg/dL (ref 2.5–4.5)
Potassium: 4.5 mmol/L (ref 3.5–5.3)
Sodium: 142 mmol/L (ref 135–146)

## 2017-05-24 LAB — GLUCOSE, CAPILLARY: GLUCOSE-CAPILLARY: 149 mg/dL — AB (ref 65–99)

## 2017-05-24 MED ORDER — INSULIN GLARGINE 100 UNIT/ML ~~LOC~~ SOLN: 20.0000 [IU] | Freq: Every day | SUBCUTANEOUS | 3 refills | Status: DC

## 2017-05-24 MED ORDER — TICAGRELOR 90 MG PO TABS: 90.0000 mg | ORAL_TABLET | Freq: Two times a day (BID) | ORAL | 3 refills | Status: DC

## 2017-05-24 MED ORDER — INSULIN PEN NEEDLE 29G X 10MM MISC: 0 refills | Status: DC

## 2017-05-24 MED ORDER — LABETALOL HCL 100 MG PO TABS: 100.0000 mg | ORAL_TABLET | Freq: Two times a day (BID) | ORAL | 1 refills | Status: DC

## 2017-05-24 NOTE — Progress Notes (Signed)
Patient ID: Amber Young, female    DOB: 1956-01-16, 61 y.o.   MRN: [id number]  PCP: Scot Jun, FNP  Chief Complaint  Patient presents with  . Follow-up    4 WEEKS  . Breast Pain    LEFT SIDE  . Back Pain    LEFT    Subjective:  HPI Amber Young is a 61 y.o. female presents for evaluation of hypertension, diabetes, breast pain, and back pain. Amber Young complains of left sided breast pain on-going for 2 week.The pain is most pronounced with lying down. She denies noticing any nodules, asymmetry of breast, or changes in breast color. She also complains of low back pain which is worst with sitting and walking. Pain averages 7-8/10. She has attempted some relief with tylenol with mild relief of symptoms. She continues to report inconsistent glucose readings, with some readings greater than 250. Amber Young does not monitor her blood pressure at home, although reports that she consisently takes her medication.  Social History   Social History  . Marital status: Married    Spouse name: N/A  . Number of children: N/A  . Years of education: 6   Occupational History  . hairdresser    Social History Main Topics  . Smoking status: Never Smoker  . Smokeless tobacco: Never Used  . Alcohol use No  . Drug use: No  . Sexual activity: Yes   Other Topics Concern  . Not on file   Social History Narrative   Married, 2 children, hairdresser   Caffeine use- occasional tea or coffee   Right handed       Family History  Problem Relation Age of Onset  . Diabetes Mother   . Hypertension Mother   . COPD Mother   . Heart failure Mother   . Hypertension Sister   . Hypertension Brother    Review of Systems See HPI  Patient Active Problem List   Diagnosis Date Noted  . Chronic anticoagulation 01/06/2017  . Non compliance w medication regimen 01/06/2017  . Breast abscess 12/20/2016  . Encounter for therapeutic drug monitoring 05/29/2016  . CAD S/P PCI mLAD DES (Promus 3.0  x 24 -- 3.25 mm) 05/19/2016  . PAF (paroxysmal atrial fibrillation) ([place]) 05/19/2016  . Cardiomyopathy, ischemic     Class: Diagnosis of  . Microcytic anemia 04/26/2016  . Acute on chronic combined systolic and diastolic CHF (congestive heart failure) ([place]) 11/14/2015  . Hypokalemia 11/14/2015  . Hypertensive emergency   . Uncontrolled hypertension 09/30/2014  . Uncontrolled diabetes mellitus ([place]) 09/30/2014  . History of stroke 09/30/2014    Allergies  Allergen Reactions  . Ace Inhibitors Cough    Prior to Admission medications   Medication Sig Start Date End Date Taking? Authorizing Provider  amLODipine (NORVASC) 10 MG tablet Take 1 tablet (10 mg total) by mouth daily. 04/26/17  Yes Scot Jun, FNP  atorvastatin (LIPITOR) 40 MG tablet Take 1 tablet (40 mg total) by mouth daily at 6 PM. 04/26/17  Yes Scot Jun, FNP  carvedilol (COREG) 25 MG tablet Take 1 tablet (25 mg total) by mouth 2 (two) times daily. 12/25/16  Yes Doreatha Lew, MD  feeding supplement, GLUCERNA SHAKE, (GLUCERNA SHAKE) LIQD Take 237 mLs by mouth 3 (three) times daily between meals. 02/11/17  Yes Hosie Poisson, MD  ferrous sulfate (FERROUSUL) 325 (65 FE) MG tablet Take 1 tablet (325 mg total) by mouth daily with breakfast. 01/22/17  Yes Scot Jun, FNP  Glucose Blood (  BLOOD GLUCOSE TEST STRIPS) STRP Use as directed 02/11/17  Yes Hosie Poisson, MD  insulin glargine (LANTUS) 100 UNIT/ML injection Inject 0.2 mLs (20 Units total) into the skin at bedtime. 02/23/17  Yes Scot Jun, FNP  insulin lispro (HUMALOG) 100 UNIT/ML injection Inject 0.05 mLs (5 Units total) into the skin 3 (three) times daily with meals. CBG 70 - 120: 0 units CBG 121 - 150: 3 units CBG 151 - 200: 4 units CBG 201 - 250: 7 units CBG 251 - 300: 11 units CBG 301 - 350: 15 units CBG 351 - 400: 20 units 02/16/17  Yes Jegede, Olugbemiga E, MD  Insulin Pen Needle 29G X 10MM MISC Use as directed 11/17/15  Yes Kathie Dike,  MD  INSULIN SYRINGE .5CC/29G 29G X 1/2" 0.5 ML MISC Use to administer insulin three times daily 01/05/17  Yes Scot Jun, FNP  losartan (COZAAR) 50 MG tablet Take 1 tablet (50 mg total) by mouth 2 (two) times daily. 02/23/17  Yes Scot Jun, FNP  ticagrelor (BRILINTA) 90 MG TABS tablet Take 1 tablet (90 mg total) by mouth 2 (two) times daily. 02/16/17  Yes Tresa Garter, MD  warfarin (COUMADIN) 5 MG tablet Take 1 tablet (5 mg total) by mouth daily at 6 PM. Patient taking differently: Take 7.5 mg by mouth daily at 6 PM.  01/05/17  Yes Scot Jun, FNP  furosemide (LASIX) 40 MG tablet Take 1 tablet (40 mg total) by mouth daily. 01/06/17 04/06/17  Erlene Quan, PA-C    Past Medical, Surgical Family and Social History reviewed and updated.    Objective:   Today's Vitals   05/24/17 1016 05/24/17 1026  BP: (!) 160/50 (!) 142/60  Pulse: 100   Resp: 16   Temp: 98.2 F (36.8 C)   TempSrc: Oral   SpO2: 100%   Weight: 172 lb (78 kg)   Height: 5\' 6"  (1.676 m)     Wt Readings from Last 3 Encounters:  05/24/17 172 lb (78 kg)  04/26/17 165 lb (74.8 kg)  03/24/17 171 lb (77.6 kg)   Physical Exam  Constitutional: She is oriented to person, place, and time. She appears well-developed and well-nourished.  HENT:  Head: Normocephalic and atraumatic.  Eyes: Pupils are equal, round, and reactive to light. Conjunctivae and EOM are normal.  Neck: Normal range of motion. Neck supple.  Cardiovascular: Normal rate, regular rhythm, normal heart sounds and intact distal pulses.   Pulmonary/Chest: Effort normal and breath sounds normal.  Musculoskeletal: Normal range of motion.  Neurological: She is alert and oriented to person, place, and time.  Skin: Skin is warm and dry.  Psychiatric: She has a normal mood and affect. Her behavior is normal. Judgment and thought content normal.   Assessment & Plan:  1. Essential hypertension, uncontrolled. Resume medication  2. Screening  for breast cancer- MM Digital Screening; Future 3. Breast pain, left- MM Digital Diagnostic Unilat L 4. Acute left-sided low back pain without sciatica- DG Lumbar Spine 2-3 Views 5. Anemia, chronic disease, repeating CBC and TIBC  6. CKD stage 3 due to type 2 diabetes mellitus ([place]), last creatinine 1.56, BUN 25 with/GFR 41, current A1C 10.3, goal <7.00.   RTC:  4 weeks for blood pressure check /3 months for diabetes and hypertension    Carroll Sage. [name], MSN, FNP-C [facility]  [name] [place], [place] [address] [phone]

## 2017-05-24 NOTE — Patient Instructions (Addendum)
Go to coumadin clinic on 10/19 for repeat INR check.  To schedule your breast exam, please call [facility] at   Phone: [phone]  Breast and back pain apply heat and continue Tylenol as needed.  Continue all blood pressure medications. I am adding labetalol 100 mg twice daily to help improve blood pressure. Goal BP is less than 140/90.    No changes to diabetes regimen.

## 2017-05-25 MED FILL — LOSARTAN POTASSIUM 50 MG TA: 50 | 30 days supply | Qty: 60 | Fill #2

## 2017-05-25 MED FILL — ?FUROSEMIDE 40 MG TABLET: 40 | 30 days supply | Qty: 30 | Fill #2

## 2017-05-26 MED ORDER — FERROUS SULFATE 325 (65 FE) MG PO TABS: 325.0000 mg | ORAL_TABLET | Freq: Three times a day (TID) | ORAL | 3 refills | Status: DC

## 2017-05-26 NOTE — Addendum Note (Signed)
Addended by: Scot Jun on: 05/26/2017 10:00 PM   Modules accepted: Orders

## 2017-05-27 ENCOUNTER — Telehealth: Payer: Self-pay

## 2017-05-27 DIAGNOSIS — N644 Mastodynia: Secondary | ICD-10-CM

## 2017-05-27 MED FILL — FERROUS SULFATE 325 MG TAB: 325 (65 FE) | 30 days supply | Qty: 90 | Fill #0

## 2017-05-27 MED FILL — BRILINTA 90 MG TABLET: 90 | 8 days supply | Qty: 16 | Fill #0

## 2017-05-27 NOTE — Telephone Encounter (Signed)
Spoke with the breast center and they state that order needs to be changed to bilateral diagnosis mammogram and a order needs to be put in for a [place] of left breast

## 2017-05-27 NOTE — Telephone Encounter (Signed)
Corrected orders per breast center request.  Carroll Sage. [name], MSN, FNP-C [facility]  [name] [place], [place] [address] [phone]

## 2017-05-28 ENCOUNTER — Ambulatory Visit (INDEPENDENT_AMBULATORY_CARE_PROVIDER_SITE_OTHER): Payer: Self-pay | Admitting: Pharmacist Clinician (PhC)/ Clinical Pharmacy Specialist

## 2017-05-28 DIAGNOSIS — Z7901 Long term (current) use of anticoagulants: Secondary | ICD-10-CM

## 2017-05-28 DIAGNOSIS — I480 Paroxysmal atrial fibrillation: Secondary | ICD-10-CM

## 2017-05-28 DIAGNOSIS — Z5181 Encounter for therapeutic drug level monitoring: Secondary | ICD-10-CM

## 2017-05-28 DIAGNOSIS — I4891 Unspecified atrial fibrillation: Secondary | ICD-10-CM

## 2017-05-28 DIAGNOSIS — I639 Cerebral infarction, unspecified: Secondary | ICD-10-CM

## 2017-05-28 LAB — POCT INR: INR: 8

## 2017-05-31 ENCOUNTER — Ambulatory Visit (INDEPENDENT_AMBULATORY_CARE_PROVIDER_SITE_OTHER): Payer: Self-pay | Admitting: Pharmacist

## 2017-05-31 DIAGNOSIS — I480 Paroxysmal atrial fibrillation: Secondary | ICD-10-CM

## 2017-05-31 DIAGNOSIS — Z5181 Encounter for therapeutic drug level monitoring: Secondary | ICD-10-CM

## 2017-05-31 LAB — POCT INR: INR: 2.3

## 2017-06-02 ENCOUNTER — Ambulatory Visit: Payer: Self-pay | Attending: Family Medicine

## 2017-06-02 ENCOUNTER — Telehealth: Payer: Self-pay

## 2017-06-03 MED ORDER — CARVEDILOL 25 MG PO TABS: 25.0000 mg | ORAL_TABLET | Freq: Two times a day (BID) | ORAL | 0 refills | Status: DC

## 2017-06-03 NOTE — Telephone Encounter (Signed)
Contact patient to inquire as to reason for her call.  Carroll Sage. [name], MSN, FNP-C [facility]  [name] [place],  [address] [phone]

## 2017-06-03 NOTE — Telephone Encounter (Signed)
She just need her medication refilled

## 2017-06-04 ENCOUNTER — Other Ambulatory Visit: Payer: Self-pay

## 2017-06-04 MED ORDER — CARVEDILOL 25 MG PO TABS: 25.0000 mg | ORAL_TABLET | Freq: Two times a day (BID) | ORAL | 0 refills | Status: DC

## 2017-06-08 ENCOUNTER — Ambulatory Visit (INDEPENDENT_AMBULATORY_CARE_PROVIDER_SITE_OTHER): Payer: Self-pay | Admitting: Pharmacist Clinician (PhC)/ Clinical Pharmacy Specialist

## 2017-06-08 DIAGNOSIS — Z7901 Long term (current) use of anticoagulants: Secondary | ICD-10-CM

## 2017-06-08 DIAGNOSIS — I480 Paroxysmal atrial fibrillation: Secondary | ICD-10-CM

## 2017-06-08 DIAGNOSIS — Z5181 Encounter for therapeutic drug level monitoring: Secondary | ICD-10-CM

## 2017-06-08 LAB — POCT INR: INR: 3.6

## 2017-06-16 ENCOUNTER — Telehealth: Payer: Self-pay | Admitting: Cardiovascular Disease

## 2017-06-16 NOTE — Telephone Encounter (Signed)
Routed to [facility]

## 2017-06-16 NOTE — Telephone Encounter (Signed)
° ° °  1. Which medications need to be refilled? (please list name of each medication and dose if known)  warfarin (COUMADIN) 5 MG tablet [[id number]]   2. Which pharmacy/location (including street and city if local pharmacy) is medication to be sent to? [place], [place]  3. Do they need a 30 day or 90 day supply? [place]

## 2017-06-17 MED ORDER — WARFARIN SODIUM 5 MG PO TABS: ORAL_TABLET | ORAL | 1 refills | Status: DC

## 2017-06-17 MED FILL — ?WARFARIN SODIUM 5 MG TABLE: 5 | 30 days supply | Qty: 34 | Fill #0

## 2017-06-22 ENCOUNTER — Ambulatory Visit (INDEPENDENT_AMBULATORY_CARE_PROVIDER_SITE_OTHER): Payer: Self-pay | Admitting: Pharmacist Clinician (PhC)/ Clinical Pharmacy Specialist

## 2017-06-22 DIAGNOSIS — I480 Paroxysmal atrial fibrillation: Secondary | ICD-10-CM

## 2017-06-22 DIAGNOSIS — Z5181 Encounter for therapeutic drug level monitoring: Secondary | ICD-10-CM

## 2017-06-22 DIAGNOSIS — Z7901 Long term (current) use of anticoagulants: Secondary | ICD-10-CM

## 2017-06-22 LAB — POCT INR: INR: 1.6

## 2017-07-05 ENCOUNTER — Telehealth: Payer: Self-pay | Admitting: Family Medicine

## 2017-07-05 MED FILL — !LANTUS SOLOSTAR 100UNITS/M: 100 | 30 days supply | Qty: 6 | Fill #2

## 2017-07-05 MED FILL — ?CARVEDILOL 25 MG TABLET: 25 | 30 days supply | Qty: 60 | Fill #0

## 2017-07-05 MED FILL — BRILINTA 90 MG TABLET: 90 | 8 days supply | Qty: 16 | Fill #1

## 2017-07-05 NOTE — Telephone Encounter (Signed)
Amber Young from Amber Young called to advise patient was requesting to refill labetalol which she hadn't picked up since her office visit 05/24/2017. Amber Young is scheduled for a follow-up here in office on 07/09/2017. Advised to home refilling medication until she returns for follow-up.   Amber Young. Amber Kingfisher, MSN, FNP-C [facility]  [name] [place], [place] [address] [phone]

## 2017-07-09 ENCOUNTER — Ambulatory Visit (INDEPENDENT_AMBULATORY_CARE_PROVIDER_SITE_OTHER): Payer: Self-pay | Admitting: Family Medicine

## 2017-07-09 ENCOUNTER — Ambulatory Visit: Payer: Self-pay | Attending: Internal Medicine

## 2017-07-09 ENCOUNTER — Encounter: Payer: Self-pay | Admitting: Family Medicine

## 2017-07-09 VITALS — BP 142/76 | HR 68 | Temp 98.0°F | Resp 16 | Ht 66.0 in | Wt 170.6 lb

## 2017-07-09 DIAGNOSIS — I509 Heart failure, unspecified: Secondary | ICD-10-CM

## 2017-07-09 DIAGNOSIS — Z794 Long term (current) use of insulin: Secondary | ICD-10-CM

## 2017-07-09 DIAGNOSIS — E119 Type 2 diabetes mellitus without complications: Secondary | ICD-10-CM

## 2017-07-09 DIAGNOSIS — I10 Essential (primary) hypertension: Secondary | ICD-10-CM

## 2017-07-09 LAB — POCT GLYCOSYLATED HEMOGLOBIN (HGB A1C): Hemoglobin A1C: 13

## 2017-07-09 MED ORDER — FUROSEMIDE 40 MG PO TABS: 40.0000 mg | ORAL_TABLET | Freq: Every day | ORAL | 3 refills | Status: DC

## 2017-07-09 MED ORDER — INSULIN GLARGINE 100 UNIT/ML ~~LOC~~ SOLN: 30.0000 [IU] | Freq: Every day | SUBCUTANEOUS | 3 refills | Status: DC

## 2017-07-09 MED ORDER — INSULIN LISPRO 100 UNIT/ML ~~LOC~~ SOLN: 8.0000 [IU] | Freq: Three times a day (TID) | SUBCUTANEOUS | 3 refills | Status: DC

## 2017-07-09 MED ORDER — POTASSIUM CHLORIDE CRYS ER 10 MEQ PO TBCR: EXTENDED_RELEASE_TABLET | ORAL | 2 refills | Status: DC

## 2017-07-09 MED ORDER — CARVEDILOL 25 MG PO TABS: 50.0000 mg | ORAL_TABLET | Freq: Two times a day (BID) | ORAL | 3 refills | Status: DC

## 2017-07-09 MED FILL — ?FUROSEMIDE 40 MG TABLET: 40 | 30 days supply | Qty: 30 | Fill #0

## 2017-07-09 MED FILL — ?HUMALOG 100 UNITS/ML VIAL: 100 | 28 days supply | Qty: 10 | Fill #0

## 2017-07-09 MED FILL — ?POTASSIUM CL ER 10MEQ TAB: 10 | 30 days supply | Qty: 30 | Fill #0

## 2017-07-09 MED FILL — ?CARVEDILOL 25 MG TABLET: 25 | 15 days supply | Qty: 60 | Fill #0

## 2017-07-09 NOTE — Progress Notes (Signed)
Patient ID: Amber Young, female    DOB: [birth year], 61 y.o.   MRN: [id number]  PCP: Scot Jun, FNP  Chief Complaint  Patient presents with  . Follow-up    6 WEEKS    Subjective:  HPI Amber Young is a 61 y.o. female presents for evaluation of diabetes and hypertension. Medical problems include uncontrolled diabetes, stage 2 chronic kidney disease, heart failure, uncontrolled hypertension, and chronic anticoagulation with Coumadin. Diabetes remains very poorly uncontrolled. Last A1C 10.3. She endorses eating and drinking foods that are available regardless of nutritional content. She reports inconsistent administration of insulin and has a history of medication non-compliance. Reports urinary frequency, attributes this to diuretic therapy. Denies polydipsia, polyphagia, and or visual disturbances. Current Body mass index is 27.54 kg/m. Amber Young's blood pressure has been poorly managed for sometime secondary to medication noncompliance and non adherence to low sodium diet. She is followed by cardiology for management of heart failure and coumadin clinic for anticoagulation management. Last INR  1.6, not at goal. She denies associated shortness of breath, PND, cough, headache, weakness, or chest pain. Social History   Socioeconomic History  . Marital status: Married    Spouse name: Not on file  . Number of children: Not on file  . Years of education: 6  . Highest education level: Not on file  Social Needs  . Financial resource strain: Not on file  . Food insecurity - worry: Not on file  . Food insecurity - inability: Not on file  . Transportation needs - medical: Not on file  . Transportation needs - non-medical: Not on file  Occupational History  . Occupation: hairdresser  Tobacco Use  . Smoking status: Never Smoker  . Smokeless tobacco: Never Used  Substance and Sexual Activity  . Alcohol use: No    Alcohol/week: 0.0 oz  . Drug use: No  . Sexual activity: Yes   Other Topics Concern  . Not on file  Social History Narrative   Married, 2 children, hairdresser   Caffeine use- occasional tea or coffee   Right handed    Family History  Problem Relation Age of Onset  . Diabetes Mother   . Hypertension Mother   . COPD Mother   . Heart failure Mother   . Hypertension Sister   . Hypertension Brother    Review of Systems  Constitutional: Positive for fatigue.  HENT: Negative.   Respiratory: Negative.   Cardiovascular: Negative.   Gastrointestinal: Negative.   Genitourinary: Negative.   Neurological: Negative.   Hematological: Negative.   Psychiatric/Behavioral: Negative.     Patient Active Problem List   Diagnosis Date Noted  . Chronic anticoagulation 01/06/2017  . Non compliance w medication regimen 01/06/2017  . Breast abscess 12/20/2016  . Encounter for therapeutic drug monitoring 05/29/2016  . CAD S/P PCI mLAD DES (Promus 3.0 x 24 -- 3.25 mm) 05/19/2016  . PAF (paroxysmal atrial fibrillation) ([facility]) 05/19/2016  . Cardiomyopathy, ischemic     Class: Diagnosis of  . Microcytic anemia 04/26/2016  . Acute on chronic combined systolic and diastolic CHF (congestive heart failure) ([facility]) 11/14/2015  . Hypokalemia 11/14/2015  . Hypertensive emergency   . Uncontrolled hypertension 09/30/2014  . Uncontrolled diabetes mellitus ([facility]) 09/30/2014  . History of stroke 09/30/2014    Allergies  Allergen Reactions  . Ace Inhibitors Cough    Prior to Admission medications   Medication Sig Start Date End Date Taking? Authorizing Provider  amLODipine (NORVASC) 10 MG tablet Take 1 tablet (10  mg total) by mouth daily. 04/26/17  Yes Scot Jun, FNP  atorvastatin (LIPITOR) 40 MG tablet Take 1 tablet (40 mg total) by mouth daily at 6 PM. 04/26/17  Yes Scot Jun, FNP  carvedilol (COREG) 25 MG tablet Take 1 tablet (25 mg total) by mouth 2 (two) times daily. 06/04/17  Yes Scot Jun, FNP  feeding supplement, GLUCERNA SHAKE,  (GLUCERNA SHAKE) LIQD Take 237 mLs by mouth 3 (three) times daily between meals. 02/11/17  Yes Hosie Poisson, MD  ferrous sulfate (FERROUSUL) 325 (65 FE) MG tablet Take 1 tablet (325 mg total) by mouth 3 (three) times daily with meals. 05/26/17  Yes Scot Jun, FNP  Glucose Blood (BLOOD GLUCOSE TEST STRIPS) STRP Use as directed 02/11/17  Yes Hosie Poisson, MD  insulin glargine (LANTUS) 100 UNIT/ML injection Inject 0.2 mLs (20 Units total) into the skin at bedtime. 05/24/17  Yes Scot Jun, FNP  insulin lispro (HUMALOG) 100 UNIT/ML injection Inject 0.05 mLs (5 Units total) into the skin 3 (three) times daily with meals. CBG 70 - 120: 0 units CBG 121 - 150: 3 units CBG 151 - 200: 4 units CBG 201 - 250: 7 units CBG 251 - 300: 11 units CBG 301 - 350: 15 units CBG 351 - 400: 20 units 02/16/17  Yes Jegede, Olugbemiga E, MD  Insulin Pen Needle 29G X 10MM MISC Use as directed 05/24/17  Yes Scot Jun, FNP  INSULIN SYRINGE .5CC/29G 29G X 1/2" 0.5 ML MISC Use to administer insulin three times daily 01/05/17  Yes Scot Jun, FNP  labetalol (NORMODYNE) 100 MG tablet Take 1 tablet (100 mg total) by mouth 2 (two) times daily. 05/24/17  Yes Scot Jun, FNP  losartan (COZAAR) 50 MG tablet Take 1 tablet (50 mg total) by mouth 2 (two) times daily. 02/23/17  Yes Scot Jun, FNP  ticagrelor (BRILINTA) 90 MG TABS tablet Take 1 tablet (90 mg total) by mouth 2 (two) times daily. 05/24/17  Yes Scot Jun, FNP  warfarin (COUMADIN) 5 MG tablet Take 1 to 1.5 tablets by mouth daily as directed by coumadin clinic 06/17/17  Yes Herminio Commons, MD  furosemide (LASIX) 40 MG tablet Take 1 tablet (40 mg total) by mouth daily. 01/06/17 04/06/17  Erlene Quan, PA-C    Past Medical, Surgical Family and Social History reviewed and updated.    Objective:   Today's Vitals   07/09/17 0938 07/09/17 1010  BP: (!) 160/74 (!) 142/76  Pulse: 68   Resp: 16   Temp: 98 F (36.7 C)    TempSrc: Oral   SpO2: 100%   Weight: 170 lb 9.6 oz (77.4 kg)   Height: 5\' 6"  (1.676 m)     Wt Readings from Last 3 Encounters:  07/09/17 170 lb 9.6 oz (77.4 kg)  05/24/17 172 lb (78 kg)  04/26/17 165 lb (74.8 kg)    Physical Exam  Constitutional: She is oriented to person, place, and time. She appears well-developed and well-nourished.  HENT:  Head: Normocephalic and atraumatic.  Eyes: Conjunctivae and EOM are normal. Pupils are equal, round, and reactive to light.  Neck: Normal range of motion. Neck supple.  Cardiovascular: Normal rate, regular rhythm, normal heart sounds and intact distal pulses.  Pulmonary/Chest: Effort normal and breath sounds normal.  Abdominal: Soft. Bowel sounds are normal.  Neurological: She is alert and oriented to person, place, and time.  Skin: Skin is warm and dry.  Psychiatric: She has  a normal mood and affect. Her behavior is normal. Judgment and thought content normal.   Assessment & Plan:  1. Type 2 diabetes mellitus without complication, with long-term current use of insulin (HCC) - POCT glycosylated hemoglobin (Hb A1C)-13.0, uncontrolled. Increase from prior A1C 10.3.  Increase Lantus to 30 units daily at bedtime and  increased Humalog 8 units 3 times daily with meals.  Also referring patient to diabetes nutrition and education.   2. Essential hypertension, uncontrolled. Suspect blood pressure remains uncontrolled secondary to medication non-compliance. Goal readings less than 140/90. . We discussed the importance of compliance with medical therapy and DASH diet recommended, consequences of uncontrolled hypertension discussed.  - continue current BP medications   3. Heart failure, unspecified HF chronicity, unspecified heart failure type ([place]) - COMPLETE METABOLIC PANEL WITH GFR -Continue follow-up at Heart Failure Clinic.  -Discussed the importance of good blood pressure control in order to prevent the recurrence of COPD exacerbation.  Meds  ordered this encounter  Medications  . DISCONTD: insulin glargine (LANTUS) 100 UNIT/ML injection    Sig: Inject 0.3 mLs (30 Units total) into the skin at bedtime.    Dispense:  30 mL    Refill:  3    Order Specific Question:   Supervising Provider    Answer:   Tresa Garter [id number]  . DISCONTD: insulin lispro (HUMALOG) 100 UNIT/ML injection    Sig: Inject 0.08 mLs (8 Units total) into the skin 3 (three) times daily with meals.    Dispense:  30 mL    Refill:  3    Order Specific Question:   Supervising Provider    Answer:   Tresa Garter [id number]  . carvedilol (COREG) 25 MG tablet    Sig: Take 2 tablets (50 mg total) by mouth 2 (two) times daily.    Dispense:  60 tablet    Refill:  3    Order Specific Question:   Supervising Provider    Answer:   Tresa Garter [id number]  . insulin glargine (LANTUS) 100 UNIT/ML injection    Sig: Inject 0.3 mLs (30 Units total) into the skin at bedtime.    Dispense:  30 mL    Refill:  3    Changing dose    Order Specific Question:   Supervising Provider    Answer:   Tresa Garter [id number]  . insulin lispro (HUMALOG) 100 UNIT/ML injection    Sig: Inject 0.08 mLs (8 Units total) into the skin 3 (three) times daily with meals.    Dispense:  30 mL    Refill:  3    Order Specific Question:   Supervising Provider    Answer:   Tresa Garter [id number]  . furosemide (LASIX) 40 MG tablet    Sig: Take 1 tablet (40 mg total) by mouth daily.    Dispense:  90 tablet    Refill:  3    Order Specific Question:   Supervising Provider    Answer:   Tresa Garter [id number]  . potassium chloride SA (K-DUR,KLOR-CON) 10 MEQ tablet    Sig: Take 1 tablet with each dose of furosemide, daily.    Dispense:  30 tablet    Refill:  2    Order Specific Question:   Supervising Provider    Answer:   Tresa Garter [id number]    Orders Placed This Encounter  Procedures  . COMPLETE METABOLIC PANEL WITH GFR  . Ambulatory  referral to diabetic education  .  POCT glycosylated hemoglobin (Hb A1C)    RTC: 1 month chronic condition follow-up   Carroll Sage. [name], MSN, FNP-C [facility]  [name] [place], [place] [address] [phone]

## 2017-07-09 NOTE — Patient Instructions (Addendum)
I have increase your carvedilol 50 mg twice daily to improve blood pressure to goal of <130/90.  Your diabetes remains very uncontrolled. I am increasing your Lantus to 30 units at bedtime and your regular short-acting insulin to 8 units, 3 times daily with meals.  I have provided you with a sample diet that list meals suggestion to aid you in improving your dietary selections.   Call regarding breast exam.    Diabetes Mellitus and Food It is important for you to manage your blood sugar (glucose) level. Your blood glucose level can be greatly affected by what you eat. Eating healthier foods in the appropriate amounts throughout the day at about the same time each day will help you control your blood glucose level. It can also help slow or prevent worsening of your diabetes mellitus. Healthy eating may even help you improve the level of your blood pressure and reach or maintain a healthy weight. General recommendations for healthful eating and cooking habits include:  Eating meals and snacks regularly. Avoid going long periods of time without eating to lose weight.  Eating a diet that consists mainly of plant-based foods, such as fruits, vegetables, nuts, legumes, and whole grains.  Using low-heat cooking methods, such as baking, instead of high-heat cooking methods, such as deep frying.  Work with your dietitian to make sure you understand how to use the Nutrition Facts information on food labels. How can food affect me? Carbohydrates Carbohydrates affect your blood glucose level more than any other type of food. Your dietitian will help you determine how many carbohydrates to eat at each meal and teach you how to count carbohydrates. Counting carbohydrates is important to keep your blood glucose at a healthy level, especially if you are using insulin or taking certain medicines for diabetes mellitus. Alcohol Alcohol can cause sudden decreases in blood glucose (hypoglycemia), especially if  you use insulin or take certain medicines for diabetes mellitus. Hypoglycemia can be a life-threatening condition. Symptoms of hypoglycemia (sleepiness, dizziness, and disorientation) are similar to symptoms of having too much alcohol. If your health care provider has given you approval to drink alcohol, do so in moderation and use the following guidelines:  Women should not have more than one drink per day, and men should not have more than two drinks per day. One drink is equal to: ? 12 oz of beer. ? 5 oz of wine. ? 1 oz of hard liquor.  Do not drink on an empty stomach.  Keep yourself hydrated. Have water, diet soda, or unsweetened iced tea.  Regular soda, juice, and other mixers might contain a lot of carbohydrates and should be counted.  What foods are not recommended? As you make food choices, it is important to remember that all foods are not the same. Some foods have fewer nutrients per serving than other foods, even though they might have the same number of calories or carbohydrates. It is difficult to get your body what it needs when you eat foods with fewer nutrients. Examples of foods that you should avoid that are high in calories and carbohydrates but low in nutrients include:  Trans fats (most processed foods list trans fats on the Nutrition Facts label).  Regular soda.  Juice.  Candy.  Sweets, such as cake, pie, doughnuts, and cookies.  Fried foods.  What foods can I eat? Eat nutrient-rich foods, which will nourish your body and keep you healthy. The food you should eat also will depend on several factors, including:  The calories you need.  The medicines you take.  Your weight.  Your blood glucose level.  Your blood pressure level.  Your cholesterol level.  You should eat a variety of foods, including:  Protein. ? Lean cuts of meat. ? Proteins low in saturated fats, such as fish, egg whites, and beans. Avoid processed meats.  Fruits and  vegetables. ? Fruits and vegetables that may help control blood glucose levels, such as apples, mangoes, and yams.  Dairy products. ? Choose fat-free or low-fat dairy products, such as milk, yogurt, and cheese.  Grains, bread, pasta, and rice. ? Choose whole grain products, such as multigrain bread, whole oats, and brown rice. These foods may help control blood pressure.  Fats. ? Foods containing healthful fats, such as nuts, avocado, olive oil, canola oil, and fish.  Does everyone with diabetes mellitus have the same meal plan? Because every person with diabetes mellitus is different, there is not one meal plan that works for everyone. It is very important that you meet with a dietitian who will help you create a meal plan that is just right for you. This information is not intended to replace advice given to you by your health care provider. Make sure you discuss any questions you have with your health care provider. Document Released: 04/23/2005 Document Revised: 01/02/2016 Document Reviewed: 06/23/2013 Elsevier Interactive Patient Education  2017 [employer].

## 2017-07-10 ENCOUNTER — Telehealth: Payer: Self-pay | Admitting: Family Medicine

## 2017-07-10 LAB — COMPLETE METABOLIC PANEL WITH GFR
AG RATIO: 0.9 (calc) — AB (ref 1.0–2.5)
ALBUMIN MSPROF: 3.2 g/dL — AB (ref 3.6–5.1)
ALT: 13 U/L (ref 6–29)
AST: 15 U/L (ref 10–35)
Alkaline phosphatase (APISO): 129 U/L (ref 33–130)
BILIRUBIN TOTAL: 0.3 mg/dL (ref 0.2–1.2)
BUN / CREAT RATIO: 20 (calc) (ref 6–22)
BUN: 27 mg/dL — ABNORMAL HIGH (ref 7–25)
CHLORIDE: 100 mmol/L (ref 98–110)
CO2: 29 mmol/L (ref 20–32)
Calcium: 9 mg/dL (ref 8.6–10.4)
Creat: 1.33 mg/dL — ABNORMAL HIGH (ref 0.50–0.99)
GFR, EST AFRICAN AMERICAN: 50 mL/min/{1.73_m2} — AB (ref >=60)
GFR, EST NON AFRICAN AMERICAN: 43 mL/min/{1.73_m2} — AB (ref >=60)
GLOBULIN: 3.5 g/dL (ref 1.9–3.7)
Glucose, Bld: 501 mg/dL (ref 65–99)
POTASSIUM: 3.8 mmol/L (ref 3.5–5.3)
Sodium: 136 mmol/L (ref 135–146)
TOTAL PROTEIN: 6.7 g/dL (ref 6.1–8.1)

## 2017-07-10 NOTE — Telephone Encounter (Signed)
Received a critical lab value of a blood glucose of 501.  Patient contacted today left voicemail advising her to monitor her blood sugar over the weekend and administer Lantus and short acting insulin as prescribed, also avoid intake of any sugar sweetened beverages or foods.  Also advised to follow-up at the office on Monday to reevaluate blood glucose and possibly receive IV fluid rehydration.  Carroll Sage. [name], MSN, FNP-C [facility]  [name] [place], [place] [address] [phone]

## 2017-07-10 NOTE — Telephone Encounter (Signed)
Amber Young, Amber my prior note regarding critical lab-and follow-up with patient if she hasn't called or come into the office on Monday morning.    Carroll Sage. [name], MSN, FNP-C [facility]  [name] [place], [place] [address] [phone]

## 2017-07-12 NOTE — Telephone Encounter (Signed)
Left a vm for patient to callback 

## 2017-07-15 ENCOUNTER — Telehealth: Payer: Self-pay | Admitting: Family Medicine

## 2017-07-15 ENCOUNTER — Other Ambulatory Visit: Payer: Self-pay | Admitting: *Deleted

## 2017-07-15 MED ORDER — TICAGRELOR 90 MG PO TABS: 90.0000 mg | ORAL_TABLET | Freq: Two times a day (BID) | ORAL | 3 refills | Status: DC

## 2017-07-15 NOTE — Telephone Encounter (Signed)
Patient will try to come in on 07/16/2017 to get fluids but will callback if she needs to move the date.

## 2017-07-15 NOTE — Telephone Encounter (Signed)
Please attempt to call patient again to request that she return to office for glucose check and possible IV fluids due to hyperglycemia

## 2017-07-15 NOTE — Telephone Encounter (Signed)
PRINTED FOR PASS PROGRAM 

## 2017-07-16 ENCOUNTER — Encounter (HOSPITAL_COMMUNITY): Payer: Self-pay

## 2017-07-19 MED FILL — $BRILINTA 90 MG TABLET: 90 | 30 days supply | Qty: 60 | Fill #2

## 2017-07-19 MED FILL — LOSARTAN POTASSIUM 50 MG TA: 50 | 30 days supply | Qty: 60 | Fill #3

## 2017-07-19 MED FILL — AMLODIPINE BESYLATE 10 MG T: 10 | 30 days supply | Qty: 30 | Fill #1

## 2017-07-21 ENCOUNTER — Encounter (HOSPITAL_COMMUNITY): Payer: Self-pay

## 2017-07-21 ENCOUNTER — Ambulatory Visit (HOSPITAL_COMMUNITY)
Admission: RE | Admit: 2017-07-21 | Discharge: 2017-07-21 | Disposition: A | Discharge status: Home / Self Care | Payer: Self-pay | Source: Ambulatory Visit | Attending: Family Medicine | Admitting: Family Medicine

## 2017-07-21 DIAGNOSIS — E1165 Type 2 diabetes mellitus with hyperglycemia: Secondary | ICD-10-CM | POA: Insufficient documentation

## 2017-07-21 LAB — GLUCOSE, CAPILLARY: GLUCOSE-CAPILLARY: 194 mg/dL — AB (ref 65–99)

## 2017-07-21 NOTE — Progress Notes (Signed)
PAT[facility]ARE [facility] NOTE  Diagnosis: Hyperglycemia    Provider: Lavell Anchors, FNP   Procedure: CBG check   Note: Patient came to day hospital due to increased blood sugar. Patient's blood sugar was checked and IV started.  CBG was 194. Maudie Mercury, [place] notified of CBG and cancelled order for IV fluids. IV discontinued and patient given discharge information. Patient alert, oriented and ambulatory at discharge.

## 2017-08-05 ENCOUNTER — Ambulatory Visit: Payer: Self-pay | Admitting: Neurology

## 2017-08-10 DIAGNOSIS — I2510 Atherosclerotic heart disease of native coronary artery without angina pectoris: Secondary | ICD-10-CM

## 2017-08-10 HISTORY — DX: Atherosclerotic heart disease of native coronary artery without angina pectoris: I25.10

## 2017-08-20 ENCOUNTER — Ambulatory Visit: Payer: Self-pay | Admitting: Family Medicine

## 2017-08-26 MED FILL — $BRILINTA 90 MG TABLET: 90 | 30 days supply | Qty: 60 | Fill #3

## 2017-08-26 MED FILL — AMLODIPINE BESYLATE 10 MG T: 10 | 30 days supply | Qty: 30 | Fill #2

## 2017-08-26 MED FILL — ?WARFARIN SODIUM 5 MG TABLE: 5 | 30 days supply | Qty: 34 | Fill #1

## 2017-08-27 ENCOUNTER — Telehealth: Payer: Self-pay | Admitting: Family Medicine

## 2017-08-27 NOTE — Telephone Encounter (Signed)
Patient stopped by office and requested for an updated status regarding her blue card. Patient requested for you to give her a call at # [phone] at your earliest convince.

## 2017-09-06 ENCOUNTER — Encounter (HOSPITAL_COMMUNITY): Payer: Self-pay | Admitting: Emergency Medicine

## 2017-09-06 ENCOUNTER — Other Ambulatory Visit: Payer: Self-pay

## 2017-09-06 ENCOUNTER — Emergency Department (HOSPITAL_COMMUNITY): Payer: BLUE CROSS/BLUE SHIELD

## 2017-09-06 DIAGNOSIS — Z888 Allergy status to other drugs, medicaments and biological substances status: Secondary | ICD-10-CM

## 2017-09-06 DIAGNOSIS — Z599 Problem related to housing and economic circumstances, unspecified: Secondary | ICD-10-CM

## 2017-09-06 DIAGNOSIS — R739 Hyperglycemia, unspecified: Secondary | ICD-10-CM | POA: Diagnosis present

## 2017-09-06 DIAGNOSIS — R0789 Other chest pain: Secondary | ICD-10-CM | POA: Diagnosis not present

## 2017-09-06 DIAGNOSIS — Z8673 Personal history of transient ischemic attack (TIA), and cerebral infarction without residual deficits: Secondary | ICD-10-CM

## 2017-09-06 DIAGNOSIS — I361 Nonrheumatic tricuspid (valve) insufficiency: Secondary | ICD-10-CM | POA: Diagnosis not present

## 2017-09-06 DIAGNOSIS — E0865 Diabetes mellitus due to underlying condition with hyperglycemia: Secondary | ICD-10-CM | POA: Diagnosis not present

## 2017-09-06 DIAGNOSIS — E1122 Type 2 diabetes mellitus with diabetic chronic kidney disease: Secondary | ICD-10-CM | POA: Diagnosis not present

## 2017-09-06 DIAGNOSIS — E1165 Type 2 diabetes mellitus with hyperglycemia: Secondary | ICD-10-CM | POA: Diagnosis not present

## 2017-09-06 DIAGNOSIS — I130 Hypertensive heart and chronic kidney disease with heart failure and stage 1 through stage 4 chronic kidney disease, or unspecified chronic kidney disease: Principal | ICD-10-CM | POA: Diagnosis present

## 2017-09-06 DIAGNOSIS — I160 Hypertensive urgency: Secondary | ICD-10-CM | POA: Diagnosis not present

## 2017-09-06 DIAGNOSIS — I25110 Atherosclerotic heart disease of native coronary artery with unstable angina pectoris: Secondary | ICD-10-CM | POA: Diagnosis not present

## 2017-09-06 DIAGNOSIS — E876 Hypokalemia: Secondary | ICD-10-CM | POA: Diagnosis not present

## 2017-09-06 DIAGNOSIS — Z7901 Long term (current) use of anticoagulants: Secondary | ICD-10-CM

## 2017-09-06 DIAGNOSIS — Z79899 Other long term (current) drug therapy: Secondary | ICD-10-CM

## 2017-09-06 DIAGNOSIS — R0602 Shortness of breath: Secondary | ICD-10-CM | POA: Diagnosis not present

## 2017-09-06 DIAGNOSIS — I2510 Atherosclerotic heart disease of native coronary artery without angina pectoris: Secondary | ICD-10-CM | POA: Diagnosis not present

## 2017-09-06 DIAGNOSIS — I5043 Acute on chronic combined systolic (congestive) and diastolic (congestive) heart failure: Secondary | ICD-10-CM | POA: Diagnosis not present

## 2017-09-06 DIAGNOSIS — I480 Paroxysmal atrial fibrillation: Secondary | ICD-10-CM | POA: Diagnosis not present

## 2017-09-06 DIAGNOSIS — E11649 Type 2 diabetes mellitus with hypoglycemia without coma: Secondary | ICD-10-CM | POA: Diagnosis present

## 2017-09-06 DIAGNOSIS — Z9861 Coronary angioplasty status: Secondary | ICD-10-CM | POA: Diagnosis not present

## 2017-09-06 DIAGNOSIS — Z794 Long term (current) use of insulin: Secondary | ICD-10-CM | POA: Diagnosis not present

## 2017-09-06 DIAGNOSIS — D509 Iron deficiency anemia, unspecified: Secondary | ICD-10-CM | POA: Diagnosis not present

## 2017-09-06 DIAGNOSIS — Z955 Presence of coronary angioplasty implant and graft: Secondary | ICD-10-CM | POA: Diagnosis not present

## 2017-09-06 DIAGNOSIS — N183 Chronic kidney disease, stage 3 (moderate): Secondary | ICD-10-CM | POA: Diagnosis present

## 2017-09-06 DIAGNOSIS — I4581 Long QT syndrome: Secondary | ICD-10-CM | POA: Diagnosis present

## 2017-09-06 DIAGNOSIS — D631 Anemia in chronic kidney disease: Secondary | ICD-10-CM | POA: Diagnosis present

## 2017-09-06 DIAGNOSIS — E785 Hyperlipidemia, unspecified: Secondary | ICD-10-CM | POA: Diagnosis present

## 2017-09-06 DIAGNOSIS — I10 Essential (primary) hypertension: Secondary | ICD-10-CM | POA: Diagnosis not present

## 2017-09-06 DIAGNOSIS — R079 Chest pain, unspecified: Secondary | ICD-10-CM | POA: Diagnosis not present

## 2017-09-06 DIAGNOSIS — I200 Unstable angina: Secondary | ICD-10-CM | POA: Diagnosis not present

## 2017-09-06 LAB — BASIC METABOLIC PANEL
Anion gap: 9 (ref 5–15)
BUN: 16 mg/dL (ref 6–20)
CHLORIDE: 105 mmol/L (ref 101–111)
CO2: 23 mmol/L (ref 22–32)
CREATININE: 1.39 mg/dL — AB (ref 0.44–1.00)
Calcium: 8.4 mg/dL — ABNORMAL LOW (ref 8.9–10.3)
GFR calc Af Amer: 46 mL/min — ABNORMAL LOW (ref >60)
GFR calc non Af Amer: 40 mL/min — ABNORMAL LOW (ref >60)
Glucose, Bld: 354 mg/dL — ABNORMAL HIGH (ref 65–99)
Potassium: 3.8 mmol/L (ref 3.5–5.1)
SODIUM: 137 mmol/L (ref 135–145)

## 2017-09-06 LAB — I-STAT TROPONIN, ED: Troponin i, poc: 0.02 ng/mL (ref 0.00–0.08)

## 2017-09-06 LAB — CBC
HCT: 28.9 % — ABNORMAL LOW (ref 36.0–46.0)
Hemoglobin: 9.1 g/dL — ABNORMAL LOW (ref 12.0–15.0)
MCH: 24.6 pg — AB (ref 26.0–34.0)
MCHC: 31.5 g/dL (ref 30.0–36.0)
MCV: 78.1 fL (ref 78.0–100.0)
PLATELETS: 334 10*3/uL (ref 150–400)
RBC: 3.7 MIL/uL — ABNORMAL LOW (ref 3.87–5.11)
RDW: 14.9 % (ref 11.5–15.5)
WBC: 5 10*3/uL (ref 4.0–10.5)

## 2017-09-06 NOTE — ED Triage Notes (Signed)
Pt c/o shortness of breath, chest tightness, and dizziness that started 1hr PTA. Also reports abdominal discomfort and nausea last night. Symptoms are intermittent. Denies chest tightness and shortness at this time. Hypertensive in triage, hx of, takes meds BID, has not had night time dose.

## 2017-09-07 ENCOUNTER — Encounter (HOSPITAL_COMMUNITY): Payer: Self-pay | Admitting: Family Medicine

## 2017-09-07 ENCOUNTER — Other Ambulatory Visit (HOSPITAL_COMMUNITY): Payer: BLUE CROSS/BLUE SHIELD

## 2017-09-07 ENCOUNTER — Encounter (HOSPITAL_COMMUNITY)
Admission: EM | Disposition: A | Discharge status: Home / Self Care | Payer: Self-pay | Source: Home / Self Care | Attending: Cardiology

## 2017-09-07 ENCOUNTER — Inpatient Hospital Stay (HOSPITAL_COMMUNITY)
Admission: EM | Admit: 2017-09-07 | Discharge: 2017-09-08 | DRG: 246 | Disposition: A | Discharge status: Home / Self Care | Payer: BLUE CROSS/BLUE SHIELD | Attending: Internal Medicine | Admitting: Internal Medicine

## 2017-09-07 ENCOUNTER — Other Ambulatory Visit: Payer: Self-pay

## 2017-09-07 DIAGNOSIS — R079 Chest pain, unspecified: Secondary | ICD-10-CM | POA: Diagnosis present

## 2017-09-07 DIAGNOSIS — I25110 Atherosclerotic heart disease of native coronary artery with unstable angina pectoris: Secondary | ICD-10-CM

## 2017-09-07 DIAGNOSIS — I200 Unstable angina: Secondary | ICD-10-CM | POA: Diagnosis present

## 2017-09-07 DIAGNOSIS — I5043 Acute on chronic combined systolic (congestive) and diastolic (congestive) heart failure: Secondary | ICD-10-CM

## 2017-09-07 DIAGNOSIS — I480 Paroxysmal atrial fibrillation: Secondary | ICD-10-CM | POA: Diagnosis present

## 2017-09-07 DIAGNOSIS — IMO0002 Reserved for concepts with insufficient information to code with codable children: Secondary | ICD-10-CM | POA: Diagnosis present

## 2017-09-07 DIAGNOSIS — N183 Chronic kidney disease, stage 3 (moderate): Secondary | ICD-10-CM

## 2017-09-07 DIAGNOSIS — Z955 Presence of coronary angioplasty implant and graft: Secondary | ICD-10-CM

## 2017-09-07 DIAGNOSIS — R739 Hyperglycemia, unspecified: Secondary | ICD-10-CM

## 2017-09-07 DIAGNOSIS — N184 Chronic kidney disease, stage 4 (severe): Secondary | ICD-10-CM | POA: Diagnosis present

## 2017-09-07 DIAGNOSIS — E1165 Type 2 diabetes mellitus with hyperglycemia: Secondary | ICD-10-CM | POA: Diagnosis present

## 2017-09-07 DIAGNOSIS — I10 Essential (primary) hypertension: Secondary | ICD-10-CM | POA: Diagnosis present

## 2017-09-07 DIAGNOSIS — I2510 Atherosclerotic heart disease of native coronary artery without angina pectoris: Secondary | ICD-10-CM

## 2017-09-07 DIAGNOSIS — D509 Iron deficiency anemia, unspecified: Secondary | ICD-10-CM | POA: Diagnosis present

## 2017-09-07 DIAGNOSIS — I130 Hypertensive heart and chronic kidney disease with heart failure and stage 1 through stage 4 chronic kidney disease, or unspecified chronic kidney disease: Secondary | ICD-10-CM | POA: Diagnosis not present

## 2017-09-07 DIAGNOSIS — I160 Hypertensive urgency: Secondary | ICD-10-CM | POA: Diagnosis present

## 2017-09-07 DIAGNOSIS — Z9861 Coronary angioplasty status: Secondary | ICD-10-CM

## 2017-09-07 HISTORY — DX: Chronic kidney disease, stage 3 (moderate): N18.3

## 2017-09-07 HISTORY — PX: CORONARY STENT INTERVENTION: CATH118234

## 2017-09-07 HISTORY — PX: LEFT HEART CATH AND CORONARY ANGIOGRAPHY: CATH118249

## 2017-09-07 LAB — BASIC METABOLIC PANEL
ANION GAP: 11 (ref 5–15)
BUN: 16 mg/dL (ref 6–20)
CALCIUM: 8.9 mg/dL (ref 8.9–10.3)
CO2: 23 mmol/L (ref 22–32)
Chloride: 105 mmol/L (ref 101–111)
Creatinine, Ser: 1.27 mg/dL — ABNORMAL HIGH (ref 0.44–1.00)
GFR calc Af Amer: 52 mL/min — ABNORMAL LOW (ref >60)
GFR calc non Af Amer: 45 mL/min — ABNORMAL LOW (ref >60)
Glucose, Bld: 237 mg/dL — ABNORMAL HIGH (ref 65–99)
Potassium: 3.3 mmol/L — ABNORMAL LOW (ref 3.5–5.1)
Sodium: 139 mmol/L (ref 135–145)

## 2017-09-07 LAB — TROPONIN I
Troponin I: 0.03 ng/mL (ref <0.03)
Troponin I: 0.57 ng/mL (ref <0.03)

## 2017-09-07 LAB — POCT ACTIVATED CLOTTING TIME
ACTIVATED CLOTTING TIME: 241 s
ACTIVATED CLOTTING TIME: 285 s

## 2017-09-07 LAB — CBC
HCT: 31.2 % — ABNORMAL LOW (ref 36.0–46.0)
Hemoglobin: 10.1 g/dL — ABNORMAL LOW (ref 12.0–15.0)
MCH: 24.9 pg — AB (ref 26.0–34.0)
MCHC: 32.4 g/dL (ref 30.0–36.0)
MCV: 77 fL — ABNORMAL LOW (ref 78.0–100.0)
Platelets: 359 10*3/uL (ref 150–400)
RBC: 4.05 MIL/uL (ref 3.87–5.11)
RDW: 14.5 % (ref 11.5–15.5)
WBC: 5.5 10*3/uL (ref 4.0–10.5)

## 2017-09-07 LAB — CBG MONITORING, ED
GLUCOSE-CAPILLARY: 221 mg/dL — AB (ref 65–99)
Glucose-Capillary: 235 mg/dL — ABNORMAL HIGH (ref 65–99)

## 2017-09-07 LAB — I-STAT TROPONIN, ED: TROPONIN I, POC: 0.02 ng/mL (ref 0.00–0.08)

## 2017-09-07 LAB — PROTIME-INR
INR: 1.03
PROTHROMBIN TIME: 13.4 s (ref 11.4–15.2)

## 2017-09-07 LAB — GLUCOSE, CAPILLARY
GLUCOSE-CAPILLARY: 256 mg/dL — AB (ref 65–99)
Glucose-Capillary: 75 mg/dL (ref 65–99)
Glucose-Capillary: 223 mg/dL — ABNORMAL HIGH (ref 65–99)

## 2017-09-07 SURGERY — LEFT HEART CATH AND CORONARY ANGIOGRAPHY
Anesthesia: LOCAL

## 2017-09-07 MED ORDER — FUROSEMIDE 40 MG PO TABS
40.0000 mg | ORAL_TABLET | Freq: Every day | ORAL | Status: DC
  Administered 2017-09-08: 40 mg via ORAL
  Filled 2017-09-07: qty 1

## 2017-09-07 MED ORDER — ASPIRIN 81 MG PO CHEW: 81.0000 mg | CHEWABLE_TABLET | ORAL | Status: DC

## 2017-09-07 MED ORDER — HYDROCODONE-ACETAMINOPHEN 5-325 MG PO TABS: 1.0000 | ORAL_TABLET | ORAL | Status: DC | PRN

## 2017-09-07 MED ORDER — SODIUM CHLORIDE 0.9 % IV SOLN: 250.0000 mL | INTRAVENOUS | Status: DC | PRN

## 2017-09-07 MED ORDER — HYDRALAZINE HCL 20 MG/ML IJ SOLN
10.0000 mg | Freq: Four times a day (QID) | INTRAMUSCULAR | Status: DC | PRN
  Administered 2017-09-07: 10 mg via INTRAVENOUS

## 2017-09-07 MED ORDER — SODIUM CHLORIDE 0.9% FLUSH: 3.0000 mL | INTRAVENOUS | Status: DC | PRN

## 2017-09-07 MED ORDER — HEPARIN (PORCINE) IN NACL 2-0.9 UNIT/ML-% IJ SOLN
INTRAMUSCULAR | Status: AC
  Filled 2017-09-07: qty 1000

## 2017-09-07 MED ORDER — INSULIN GLARGINE 100 UNIT/ML ~~LOC~~ SOLN
30.0000 [IU] | Freq: Every day | SUBCUTANEOUS | Status: DC
  Filled 2017-09-07: qty 0.3

## 2017-09-07 MED ORDER — MIDAZOLAM HCL 2 MG/2ML IJ SOLN
INTRAMUSCULAR | Status: DC | PRN
  Administered 2017-09-07: 1 mg via INTRAVENOUS

## 2017-09-07 MED ORDER — HEPARIN (PORCINE) IN NACL 2-0.9 UNIT/ML-% IJ SOLN
INTRAMUSCULAR | Status: AC | PRN
  Administered 2017-09-07: 1000 mL

## 2017-09-07 MED ORDER — LIDOCAINE HCL (PF) 1 % IJ SOLN
INTRAMUSCULAR | Status: AC
  Filled 2017-09-07: qty 30

## 2017-09-07 MED ORDER — IOPAMIDOL (ISOVUE-370) INJECTION 76%
INTRAVENOUS | Status: DC | PRN
  Administered 2017-09-07: 200 mL via INTRA_ARTERIAL

## 2017-09-07 MED ORDER — ONDANSETRON HCL 4 MG/2ML IJ SOLN
4.0000 mg | Freq: Four times a day (QID) | INTRAMUSCULAR | Status: DC | PRN
  Filled 2017-09-07 (×2): qty 2

## 2017-09-07 MED ORDER — INSULIN GLARGINE 100 UNIT/ML ~~LOC~~ SOLN: 15.0000 [IU] | Freq: Every day | SUBCUTANEOUS | Status: DC

## 2017-09-07 MED ORDER — SODIUM CHLORIDE 0.9 % WEIGHT BASED INFUSION: 1.0000 mL/kg/h | INTRAVENOUS | Status: DC

## 2017-09-07 MED ORDER — WARFARIN SODIUM 2.5 MG PO TABS
12.5000 mg | ORAL_TABLET | Freq: Once | ORAL | Status: AC
  Administered 2017-09-07: 12.5 mg via ORAL
  Filled 2017-09-07: qty 1

## 2017-09-07 MED ORDER — VERAPAMIL HCL 2.5 MG/ML IV SOLN
INTRAVENOUS | Status: AC
  Filled 2017-09-07: qty 2

## 2017-09-07 MED ORDER — VERAPAMIL HCL 2.5 MG/ML IV SOLN
INTRAVENOUS | Status: DC | PRN
  Administered 2017-09-07: 10 mL via INTRA_ARTERIAL

## 2017-09-07 MED ORDER — TICAGRELOR 90 MG PO TABS
90.0000 mg | ORAL_TABLET | Freq: Two times a day (BID) | ORAL | Status: DC
  Administered 2017-09-07 – 2017-09-08 (×3): 90 mg via ORAL
  Filled 2017-09-07 (×3): qty 1

## 2017-09-07 MED ORDER — HYDRALAZINE HCL 20 MG/ML IJ SOLN
5.0000 mg | INTRAMUSCULAR | Status: AC | PRN
  Filled 2017-09-07: qty 1

## 2017-09-07 MED ORDER — MIDAZOLAM HCL 2 MG/2ML IJ SOLN
INTRAMUSCULAR | Status: AC
  Filled 2017-09-07: qty 2

## 2017-09-07 MED ORDER — LABETALOL HCL 5 MG/ML IV SOLN
10.0000 mg | INTRAVENOUS | Status: AC | PRN
  Administered 2017-09-07: 18:00:00 10 mg via INTRAVENOUS
  Filled 2017-09-07: qty 4

## 2017-09-07 MED ORDER — INSULIN ASPART 100 UNIT/ML ~~LOC~~ SOLN
0.0000 [IU] | SUBCUTANEOUS | Status: DC
  Administered 2017-09-07: 5 [IU] via SUBCUTANEOUS
  Administered 2017-09-07: 17:00:00 8 [IU] via SUBCUTANEOUS
  Filled 2017-09-07: qty 1

## 2017-09-07 MED ORDER — HEPARIN SODIUM (PORCINE) 1000 UNIT/ML IJ SOLN
INTRAMUSCULAR | Status: DC | PRN
  Administered 2017-09-07: 3000 [IU] via INTRAVENOUS
  Administered 2017-09-07 (×2): 4000 [IU] via INTRAVENOUS

## 2017-09-07 MED ORDER — WARFARIN - PHARMACIST DOSING INPATIENT: Freq: Every day | Status: DC

## 2017-09-07 MED ORDER — ATORVASTATIN CALCIUM 40 MG PO TABS
40.0000 mg | ORAL_TABLET | Freq: Every day | ORAL | Status: DC
  Administered 2017-09-07: 40 mg via ORAL
  Filled 2017-09-07: qty 1

## 2017-09-07 MED ORDER — SODIUM CHLORIDE 0.9 % IV SOLN
INTRAVENOUS | Status: AC | PRN
  Administered 2017-09-07: 50 mL/h via INTRAVENOUS

## 2017-09-07 MED ORDER — INSULIN ASPART 100 UNIT/ML ~~LOC~~ SOLN
0.0000 [IU] | Freq: Every day | SUBCUTANEOUS | Status: DC
  Administered 2017-09-07: 2 [IU] via SUBCUTANEOUS

## 2017-09-07 MED ORDER — HEPARIN SODIUM (PORCINE) 1000 UNIT/ML IJ SOLN
INTRAMUSCULAR | Status: AC
  Filled 2017-09-07: qty 1

## 2017-09-07 MED ORDER — ASPIRIN 81 MG PO CHEW
324.0000 mg | CHEWABLE_TABLET | Freq: Once | ORAL | Status: AC
  Administered 2017-09-07: 324 mg via ORAL
  Filled 2017-09-07: qty 4

## 2017-09-07 MED ORDER — SODIUM CHLORIDE 0.9 % WEIGHT BASED INFUSION: 3.0000 mL/kg/h | INTRAVENOUS | Status: DC

## 2017-09-07 MED ORDER — SODIUM CHLORIDE 0.9% FLUSH
3.0000 mL | Freq: Two times a day (BID) | INTRAVENOUS | Status: DC
  Administered 2017-09-07: 21:00:00 3 mL via INTRAVENOUS

## 2017-09-07 MED ORDER — MORPHINE SULFATE (PF) 4 MG/ML IV SOLN: 2.0000 mg | INTRAVENOUS | Status: DC | PRN

## 2017-09-07 MED ORDER — ALPRAZOLAM 0.25 MG PO TABS: 0.2500 mg | ORAL_TABLET | Freq: Two times a day (BID) | ORAL | Status: DC | PRN

## 2017-09-07 MED ORDER — IOPAMIDOL (ISOVUE-370) INJECTION 76%
INTRAVENOUS | Status: AC
  Filled 2017-09-07: qty 125

## 2017-09-07 MED ORDER — ACETAMINOPHEN 325 MG PO TABS
650.0000 mg | ORAL_TABLET | ORAL | Status: DC | PRN
Start: 2017-09-07 — End: 2017-09-08

## 2017-09-07 MED ORDER — AMLODIPINE BESYLATE 10 MG PO TABS
10.0000 mg | ORAL_TABLET | Freq: Every day | ORAL | Status: DC
Start: 2017-09-07 — End: 2017-09-08
  Administered 2017-09-07 – 2017-09-08 (×2): 10 mg via ORAL
  Filled 2017-09-07: qty 2
  Filled 2017-09-07: qty 1

## 2017-09-07 MED ORDER — LOSARTAN POTASSIUM 50 MG PO TABS
50.0000 mg | ORAL_TABLET | Freq: Two times a day (BID) | ORAL | Status: DC
  Administered 2017-09-07 – 2017-09-08 (×3): 50 mg via ORAL
  Filled 2017-09-07 (×3): qty 1

## 2017-09-07 MED ORDER — SODIUM CHLORIDE 0.9% FLUSH
3.0000 mL | Freq: Two times a day (BID) | INTRAVENOUS | Status: DC
  Administered 2017-09-07 – 2017-09-08 (×2): 3 mL via INTRAVENOUS

## 2017-09-07 MED ORDER — INSULIN ASPART 100 UNIT/ML ~~LOC~~ SOLN
0.0000 [IU] | Freq: Three times a day (TID) | SUBCUTANEOUS | Status: DC
  Administered 2017-09-08: 2 [IU] via SUBCUTANEOUS

## 2017-09-07 MED ORDER — INSULIN GLARGINE 100 UNIT/ML ~~LOC~~ SOLN
20.0000 [IU] | Freq: Every day | SUBCUTANEOUS | Status: DC
  Administered 2017-09-07: 20 [IU] via SUBCUTANEOUS
  Filled 2017-09-07 (×2): qty 0.2

## 2017-09-07 MED ORDER — CARVEDILOL 12.5 MG PO TABS
50.0000 mg | ORAL_TABLET | Freq: Two times a day (BID) | ORAL | Status: DC
  Administered 2017-09-07 – 2017-09-08 (×3): 50 mg via ORAL
  Filled 2017-09-07 (×3): qty 4

## 2017-09-07 MED ORDER — POTASSIUM CHLORIDE CRYS ER 20 MEQ PO TBCR
40.0000 meq | EXTENDED_RELEASE_TABLET | Freq: Once | ORAL | Status: AC
  Administered 2017-09-07: 40 meq via ORAL
  Filled 2017-09-07: qty 2

## 2017-09-07 MED ORDER — FENTANYL CITRATE (PF) 100 MCG/2ML IJ SOLN
INTRAMUSCULAR | Status: DC | PRN
  Administered 2017-09-07: 50 ug via INTRAVENOUS

## 2017-09-07 MED ORDER — ANGIOPLASTY BOOK
Freq: Once | Status: AC
  Administered 2017-09-07: 1
  Filled 2017-09-07: qty 1

## 2017-09-07 MED ORDER — SODIUM CHLORIDE 0.9 % IV SOLN: INTRAVENOUS | Status: AC

## 2017-09-07 MED ORDER — FENTANYL CITRATE (PF) 100 MCG/2ML IJ SOLN
INTRAMUSCULAR | Status: AC
  Filled 2017-09-07: qty 2

## 2017-09-07 MED ORDER — NITROGLYCERIN 0.4 MG SL SUBL: 0.4000 mg | SUBLINGUAL_TABLET | SUBLINGUAL | Status: DC | PRN

## 2017-09-07 MED ORDER — FUROSEMIDE 10 MG/ML IJ SOLN
40.0000 mg | Freq: Once | INTRAMUSCULAR | Status: AC
  Administered 2017-09-07: 40 mg via INTRAVENOUS
  Filled 2017-09-07: qty 4

## 2017-09-07 MED ORDER — METOPROLOL TARTRATE 5 MG/5ML IV SOLN
7.5000 mg | Freq: Once | INTRAVENOUS | Status: AC
  Administered 2017-09-07: 7.5 mg via INTRAVENOUS
  Filled 2017-09-07: qty 10

## 2017-09-07 MED ORDER — LIDOCAINE HCL (PF) 1 % IJ SOLN
INTRAMUSCULAR | Status: DC | PRN
  Administered 2017-09-07: 2 mL

## 2017-09-07 SURGICAL SUPPLY — 23 items
BALLN EMERGE MR 2.5X12 (BALLOONS) ×2
BALLN SAPPHIRE ~~LOC~~ 2.5X10 (BALLOONS) ×2 IMPLANT
BALLN SAPPHIRE ~~LOC~~ 3.0X8 (BALLOONS) ×2 IMPLANT
BALLN SAPPHIRE ~~LOC~~ 3.5X8 (BALLOONS) ×2 IMPLANT
BALLOON EMERGE MR 2.5X12 (BALLOONS) ×1 IMPLANT
CATH LAUNCHER 6FR JR4 (CATHETERS) ×2 IMPLANT
CATH OPTITORQUE TIG 4.0 5F (CATHETERS) ×2 IMPLANT
DEVICE RAD COMP TR BAND LRG (VASCULAR PRODUCTS) ×2 IMPLANT
GLIDESHEATH SLEND A-KIT 6F 22G (SHEATH) ×2 IMPLANT
GUIDELINER 6F (CATHETERS) ×2 IMPLANT
GUIDEWIRE INQWIRE 1.5J.035X260 (WIRE) ×1 IMPLANT
INQWIRE 1.5J .035X260CM (WIRE) ×2
KIT ENCORE 26 ADVANTAGE (KITS) ×2 IMPLANT
KIT HEART LEFT (KITS) ×2 IMPLANT
KIT HEMO VALVE WATCHDOG (MISCELLANEOUS) ×2 IMPLANT
PACK CARDIAC CATHETERIZATION (CUSTOM PROCEDURE TRAY) ×2 IMPLANT
STENT PROMUS PREM MR 2.75X16 (Permanent Stent) ×2 IMPLANT
STENT PROMUS PREM MR 3.0X12 (Permanent Stent) ×2 IMPLANT
TRANSDUCER W/STOPCOCK (MISCELLANEOUS) ×2 IMPLANT
TUBING CIL FLEX 10 FLL-RA (TUBING) ×2 IMPLANT
WIRE COUGAR XT STRL 190CM (WIRE) ×2 IMPLANT
WIRE HI TORQ BMW 190CM (WIRE) ×2 IMPLANT
WIRE MAILMAN 182CM (WIRE) ×2 IMPLANT

## 2017-09-07 NOTE — ED Notes (Signed)
Pt endorses mild nausea requesting to eat- Pt explained diet is NPO and reasoning. Pt agreeable. RN went to get zofran for nausea pt sts does not need presently.

## 2017-09-07 NOTE — Progress Notes (Signed)
TR BAND REMOVAL  LOCATION:    right radial  DEFLATED PER PROTOCOL:    Yes.    TIME BAND OFF / DRESSING APPLIED:    1800   SITE UPON ARRIVAL:    Level 0  SITE AFTER BAND REMOVAL:    Level 0  CIRCULATION SENSATION AND MOVEMENT:    Within Normal Limits   Yes.    COMMENTS:   Tolerated procedure well

## 2017-09-07 NOTE — Progress Notes (Signed)
CRITICAL VALUE ALERT  Critical value received:  Trop.=0.03  Date of notification: 09/07/17  Time of notification:  8902  Critical value read back:Yes.    Nurse who received alert:Melanie RN  MD notified (1st page):  NP Sharolyn Douglas  Time of first page:  44  MD notified (2nd page):  Time of second page:  Responding MD:  NP Sharolyn Douglas  Time MD responded:  [phone]

## 2017-09-07 NOTE — ED Provider Notes (Signed)
TIME SEEN: 4:58 AM  CHIEF COMPLAINT: Chest pain  HPI: Patient is a 62 year old female with history of hypertension, hyperlipidemia, diabetes, previous stroke, atrial fibrillation on Coumadin, CAD status post stent on Brilinta who presents to the emergency department with complaints of 6 months of exertional chest pain and shortness of breath.  States that symptoms have progressively worsened and recently she has had symptoms at rest.  States tonight around 9 PM after eating dinner she had left-sided chest tightness without radiation, shortness of breath, nausea without vomiting, dizziness and generalized weakness.  Symptoms lasted 10-15 minutes and then resolved spontaneously.  States symptoms felt similar to when she had to have her stent placed in 2017.  Her cardiologist is Dr. Bronson Ing.  Asymptomatic currently other than edema in bilateral lower extremities which she states has been present for the past several days.  No fever or cough.  ROS: See HPI Constitutional: no fever  Eyes: no drainage  ENT: no runny nose   Cardiovascular:  chest pain  Resp: SOB  GI: no vomiting GU: no dysuria Integumentary: no rash  Allergy: no hives  Musculoskeletal: no leg swelling  Neurological: no slurred speech ROS otherwise negative  PAST MEDICAL HISTORY/PAST SURGICAL HISTORY:  Past Medical History:  Diagnosis Date  . Anemia   . CHF (congestive heart failure) ([place])   . Coronary artery disease   . GERD (gastroesophageal reflux disease)   . Heart murmur   . Hyperlipidemia   . Hypertension   . Stroke ([place]) 09/2014   numbness left upper lip, finger tips on left hand; "resolved" (05/18/2016)  . Type II diabetes mellitus (H[facility])     MEDICATIONS:  Prior to Admission medications   Medication Sig Start Date End Date Taking? Authorizing Provider  amLODipine (NORVASC) 10 MG tablet Take 1 tablet (10 mg total) by mouth daily. 04/26/17   Scot Jun, FNP  atorvastatin (LIPITOR) 40 MG tablet Take 1  tablet (40 mg total) by mouth daily at 6 PM. 04/26/17   Scot Jun, FNP  carvedilol (COREG) 25 MG tablet Take 2 tablets (50 mg total) by mouth 2 (two) times daily. 07/09/17   Scot Jun, FNP  feeding supplement, GLUCERNA SHAKE, (GLUCERNA SHAKE) LIQD Take 237 mLs by mouth 3 (three) times daily between meals. 02/11/17   Hosie Poisson, MD  ferrous sulfate (FERROUSUL) 325 (65 FE) MG tablet Take 1 tablet (325 mg total) by mouth 3 (three) times daily with meals. 05/26/17   Scot Jun, FNP  furosemide (LASIX) 40 MG tablet Take 1 tablet (40 mg total) by mouth daily. 07/09/17 07/04/18  Scot Jun, FNP  Glucose Blood (BLOOD GLUCOSE TEST STRIPS) STRP Use as directed 02/11/17   Hosie Poisson, MD  insulin glargine (LANTUS) 100 UNIT/ML injection Inject 0.3 mLs (30 Units total) into the skin at bedtime. 07/09/17   Scot Jun, FNP  insulin lispro (HUMALOG) 100 UNIT/ML injection Inject 0.08 mLs (8 Units total) into the skin 3 (three) times daily with meals. 07/09/17   Scot Jun, FNP  Insulin Pen Needle 29G X 10MM MISC Use as directed 05/24/17   Scot Jun, FNP  INSULIN SYRINGE .5CC/29G 29G X 1/2" 0.5 ML MISC Use to administer insulin three times daily 01/05/17   Scot Jun, FNP  losartan (COZAAR) 50 MG tablet Take 1 tablet (50 mg total) by mouth 2 (two) times daily. 02/23/17   Scot Jun, FNP  potassium chloride SA (K-DUR,KLOR-CON) 10 MEQ tablet Take 1 tablet with  each dose of furosemide, daily. 07/09/17   Scot Jun, FNP  ticagrelor (BRILINTA) 90 MG TABS tablet Take 1 tablet (90 mg total) by mouth 2 (two) times daily. 07/15/17   Tresa Garter, MD  warfarin (COUMADIN) 5 MG tablet Take 1 to 1.5 tablets by mouth daily as directed by coumadin clinic 06/17/17   Herminio Commons, MD    ALLERGIES:  Allergies  Allergen Reactions  . Ace Inhibitors Cough    SOCIAL HISTORY:  Social History   Tobacco Use  . Smoking status: Never Smoker   . Smokeless tobacco: Never Used  Substance Use Topics  . Alcohol use: No    Alcohol/week: 0.0 oz    FAMILY HISTORY: Family History  Problem Relation Age of Onset  . Diabetes Mother   . Hypertension Mother   . COPD Mother   . Heart failure Mother   . Hypertension Sister   . Hypertension Brother     EXAM: BP (!) 201/102 (BP Location: Right Arm)   Pulse 87   Temp 98.1 F (36.7 C) (Oral)   Resp 18   Ht 5\' 6"  (1.676 m)   SpO2 97%   BMI 27.54 kg/m  CONSTITUTIONAL: Alert and oriented and responds appropriately to questions. Well-appearing; well-nourished HEAD: Normocephalic EYES: Conjunctivae clear, pupils appear equal, EOMI ENT: normal nose; moist mucous membranes NECK: Supple, no meningismus, no nuchal rigidity, no LAD  CARD: RRR; S1 and S2 appreciated; no murmurs, no clicks, no rubs, no gallops RESP: Normal chest excursion without splinting or tachypnea; breath sounds clear and equal bilaterally; no wheezes, no rhonchi, no rales, no hypoxia or respiratory distress, speaking full sentences ABD/GI: Normal bowel sounds; non-distended; soft, non-tender, no rebound, no guarding, no peritoneal signs, no hepatosplenomegaly BACK:  The back appears normal and is non-tender to palpation, there is no CVA tenderness EXT: Normal ROM in all joints; non-tender to palpation; no edema; normal capillary refill; no cyanosis, no calf tenderness or swelling    SKIN: Normal color for age and race; warm; no rash NEURO: Moves all extremities equally PSYCH: The patient's mood and manner are appropriate. Grooming and personal hygiene are appropriate.  MEDICAL DECISION MAKING: Patient here with concerning story for chest pain.  Has history of CAD with a stent and reports this feels similar to her anginal equivalent.  Currently still asymptomatic at this time.  Was hypertensive in the waiting room.  We will recheck her blood pressure.  Troponin x1 is negative.  Will repeat second troponin.  EKG shows no  new ischemic change.  Chest x-ray shows no infiltrate or edema.  I feel she will need admission.  Will give full dose aspirin.  Patient comfortable with this plan.  ED PROGRESS: 5:10 AM Discussed patient's case with hospitalist, Dr. Myna Hidalgo.  I have recommended admission and patient (and family if present) agree with this plan. Admitting physician will place admission orders.   I reviewed all nursing notes, vitals, pertinent previous records, EKGs, lab and urine results, imaging (as available).        EKG Interpretation  Date/Time:  Monday September 06 2017 22:19:26 EST Ventricular Rate:  91 PR Interval:  178 QRS Duration: 84 QT Interval:  396 QTC Calculation: 487 R Axis:   57 Text Interpretation:  Normal sinus rhythm Possible Left atrial enlargement Septal infarct , age undetermined ST & T wave abnormality, consider lateral ischemia Abnormal ECG No significant change since last tracing Confirmed by Pryor Curia [phone]) on 09/07/2017 4:57:02 AM  Jalene Lacko, Delice Bison, DO 09/07/17 [phone]

## 2017-09-07 NOTE — H&P (Addendum)
History and Physical    Amber Young NOM:[id number] DOB: 01-23-1956 DOA: 09/07/2017  PCP: Scot Jun, FNP   Patient coming from: Home  Chief Complaint: Chest tightness, SOB   HPI: Amber Young is a 62 y.o. female with medical history significant for coronary artery disease with stent, hypertension, insulin-dependent diabetes mellitus, paroxysmal atrial fibrillation on Coumadin, and chronic combined systolic/diastolic CHF, now presenting to the emergency department for evaluation of chest tightness and shortness of breath.  Patient reports that she had been experiencing occasional chest tightness and dyspnea over the past few months, usually with exertion or during times of psychosocial stress.  Over the past couple days, these episodes have been more frequent and more severe.  Overnight, she experienced a episode that began while at rest marked by a "tightness" involving the central chest and associated with mild dyspnea and mild nausea without vomiting.  The symptoms resolve spontaneously prior to arrival in the emergency department.  She reports that it lasted approximately 10-15 minutes and was similar to her experiences that led up to her stent placement in October 2017.  She denies recent fevers or chills.  Denies diaphoresis.  ED Course: Upon arrival to the ED, patient is found to be afebrile, saturating well on room air, hypertensive to 200/100, and with vitals otherwise stable.  EKG features a sinus rhythm with nonspecific ST-T  abnormalities in the lateral leads.  Chest x-ray is notable for cardiomegaly, but no acute cardiopulmonary disease.  Chemistry panel reveals a serum creatinine of 1.39, consistent with her apparent baseline, and a serum glucose of 354.  CBC is notable for a stable chronic microcytic anemia with hemoglobin of 9.1.  Patient was treated with 324 mg of aspirin in the ED.  She remains hemodynamically stable, free of chest pain, and no apparent respiratory  distress, and will be observed on the telemetry unit for ongoing evaluation and management of transient chest tightness in a patient with known CAD.  Review of Systems:  All other systems reviewed and apart from HPI, are negative.  Past Medical History:  Diagnosis Date  . Anemia   . CHF (congestive heart failure) ([place])   . Coronary artery disease   . GERD (gastroesophageal reflux disease)   . Heart murmur   . Hyperlipidemia   . Hypertension   . Stroke ([place]) 09/2014   numbness left upper lip, finger tips on left hand; "resolved" (05/18/2016)  . Type II diabetes mellitus ([place])     Past Surgical History:  Procedure Laterality Date  . CARDIAC CATHETERIZATION N/A 05/18/2016   Procedure: Left Heart Cath and Coronary Angiography;  Surgeon: Jettie Booze, MD;  Location: [facility];  Service: Cardiovascular;  Laterality: N/A;  . CARDIAC CATHETERIZATION N/A 05/18/2016   Procedure: Coronary Stent Intervention;  Surgeon: Jettie Booze, MD;  Location: [facility];  Service: Cardiovascular;  Laterality: N/A;  . CORONARY ANGIOPLASTY    . TUBAL LIGATION  1980  . VAGINAL HYSTERECTOMY  1999   "partial; fibroids"     reports that  has never smoked. she has never used smokeless tobacco. She reports that she does not drink alcohol or use drugs.  Allergies  Allergen Reactions  . Ace Inhibitors Cough    Family History  Problem Relation Age of Onset  . Diabetes Mother   . Hypertension Mother   . COPD Mother   . Heart failure Mother   . Hypertension Sister   . Hypertension Brother  Prior to Admission medications   Medication Sig Start Date End Date Taking? Authorizing Provider  amLODipine (NORVASC) 10 MG tablet Take 1 tablet (10 mg total) by mouth daily. 04/26/17   Scot Jun, FNP  atorvastatin (LIPITOR) 40 MG tablet Take 1 tablet (40 mg total) by mouth daily at 6 PM. 04/26/17   Scot Jun, FNP  carvedilol (COREG) 25 MG tablet Take 2 tablets (50 mg  total) by mouth 2 (two) times daily. 07/09/17   Scot Jun, FNP  feeding supplement, GLUCERNA SHAKE, (GLUCERNA SHAKE) LIQD Take 237 mLs by mouth 3 (three) times daily between meals. 02/11/17   Hosie Poisson, MD  ferrous sulfate (FERROUSUL) 325 (65 FE) MG tablet Take 1 tablet (325 mg total) by mouth 3 (three) times daily with meals. 05/26/17   Scot Jun, FNP  furosemide (LASIX) 40 MG tablet Take 1 tablet (40 mg total) by mouth daily. 07/09/17 07/04/18  Scot Jun, FNP  Glucose Blood (BLOOD GLUCOSE TEST STRIPS) STRP Use as directed 02/11/17   Hosie Poisson, MD  insulin glargine (LANTUS) 100 UNIT/ML injection Inject 0.3 mLs (30 Units total) into the skin at bedtime. 07/09/17   Scot Jun, FNP  insulin lispro (HUMALOG) 100 UNIT/ML injection Inject 0.08 mLs (8 Units total) into the skin 3 (three) times daily with meals. 07/09/17   Scot Jun, FNP  Insulin Pen Needle 29G X 10MM MISC Use as directed 05/24/17   Scot Jun, FNP  INSULIN SYRINGE .5CC/29G 29G X 1/2" 0.5 ML MISC Use to administer insulin three times daily 01/05/17   Scot Jun, FNP  losartan (COZAAR) 50 MG tablet Take 1 tablet (50 mg total) by mouth 2 (two) times daily. 02/23/17   Scot Jun, FNP  potassium chloride SA (K-DUR,KLOR-CON) 10 MEQ tablet Take 1 tablet with each dose of furosemide, daily. 07/09/17   Scot Jun, FNP  ticagrelor (BRILINTA) 90 MG TABS tablet Take 1 tablet (90 mg total) by mouth 2 (two) times daily. 07/15/17   Tresa Garter, MD  warfarin (COUMADIN) 5 MG tablet Take 1 to 1.5 tablets by mouth daily as directed by coumadin clinic 06/17/17   Herminio Commons, MD    Physical Exam: Vitals:   09/06/17 2222 09/06/17 2225  BP:  (!) 201/102  Pulse:  87  Resp:  18  Temp:  98.1 F (36.7 C)  TempSrc:  Oral  SpO2:  97%  Height: 5\' 6"  (1.676 m)       Constitutional: NAD, calm  Eyes: PERTLA, lids and conjunctivae normal ENMT: Mucous membranes are  moist. Posterior pharynx clear of any exudate or lesions.   Neck: normal, supple, no masses, no thyromegaly Respiratory: clear to auscultation bilaterally, no wheezing, no crackles. Normal respiratory effort.  .  Cardiovascular: S1 & S2 heard, regular rate and rhythm. 2+ pretibial edema bilaterally. No significant JVD. Abdomen: No distension, no tenderness, no masses palpated. Bowel sounds normal.  Musculoskeletal: no clubbing / cyanosis. No joint deformity upper and lower extremities. Normal muscle tone.  Skin: no significant rashes, lesions, ulcers. Warm, dry, well-perfused. Neurologic: CN 2-12 grossly intact. Sensation intact. Strength 5/5 in all 4 limbs.  Psychiatric: Alert and oriented x 3. Pleasant and cooperative.     Labs on Admission: I have personally reviewed following labs and imaging studies  CBC: Recent Labs  Lab 09/06/17 2239  WBC 5.0  HGB 9.1*  HCT 28.9*  MCV 78.1  PLT 818   Basic Metabolic Panel: Recent  Labs  Lab 09/06/17 2239  NA 137  K 3.8  CL 105  CO2 23  GLUCOSE 354*  BUN 16  CREATININE 1.39*  CALCIUM 8.4*   GFR: CrCl cannot be calculated (Unknown ideal weight.). Liver Function Tests: No results for input(s): AST, ALT, ALKPHOS, BILITOT, PROT, ALBUMIN in the last 168 hours. No results for input(s): LIPASE, AMYLASE in the last 168 hours. No results for input(s): AMMONIA in the last 168 hours. Coagulation Profile: No results for input(s): INR, PROTIME in the last 168 hours. Cardiac Enzymes: No results for input(s): CKTOTAL, CKMB, CKMBINDEX, TROPONINI in the last 168 hours. BNP (last 3 results) No results for input(s): PROBNP in the last 8760 hours. HbA1C: No results for input(s): HGBA1C in the last 72 hours. CBG: No results for input(s): GLUCAP in the last 168 hours. Lipid Profile: No results for input(s): CHOL, HDL, LDLCALC, TRIG, CHOLHDL, LDLDIRECT in the last 72 hours. Thyroid Function Tests: No results for input(s): TSH, T4TOTAL, FREET4,  T3FREE, THYROIDAB in the last 72 hours. Anemia Panel: No results for input(s): VITAMINB12, FOLATE, FERRITIN, TIBC, IRON, RETICCTPCT in the last 72 hours. Urine analysis:    Component Value Date/Time   COLORURINE YELLOW 02/08/2017 1233   APPEARANCEUR HAZY (A) 02/08/2017 1233   LABSPEC 1.020 05/24/2017 1028   PHURINE 6.0 05/24/2017 1028   GLUCOSEU 250 (A) 05/24/2017 1028   HGBUR MODERATE (A) 05/24/2017 1028   BILIRUBINUR NEGATIVE 05/24/2017 1028   KETONESUR NEGATIVE 05/24/2017 1028   PROTEINUR >=300 (A) 05/24/2017 1028   UROBILINOGEN 0.2 05/24/2017 1028   NITRITE NEGATIVE 05/24/2017 1028   LEUKOCYTESUR NEGATIVE 05/24/2017 1028   Sepsis Labs: @LABRCNTIP (procalcitonin:4,lacticidven:4) )No results found for this or any previous visit (from the past 240 hour(s)).   Radiological Exams on Admission: Dg Chest 2 View  Result Date: 09/06/2017 CLINICAL DATA:  Chest pain, nausea EXAM: CHEST  2 VIEW COMPARISON:  02/08/2017 FINDINGS: Cardiomegaly.  Lungs clear.  No effusions or acute bony abnormality. IMPRESSION: Cardiomegaly.  No active disease. Electronically Signed   By: Rolm Baptise M.D.   On: 09/06/2017 23:21    EKG: Independently reviewed. Sinus rhythm, non-specific ST-T abnormalities in lateral leads.   Assessment/Plan  1. Chest pain; CAD  - Presents with SOB and chest tightness, onset at rest, resolved spontaneously prior to admission  - CXR is unremarkable and initial troponin is negative; EKG features non-specific abnormalities in lateral leads that are similar to prior   - She was treated with ASA 324 mg in ED and is anticoagulated with warfarin  - Continue cardiac monitoring, obtain serial troponin measurements, repeat EKG, continue beta-blocker, ARB, statin, and Brilinta  2. Hypertension with hypertensive urgency  - BP elevated to 200/100 range in ED  - Treat acutely with Lopressor given the chest pain  - Continue Coreg, losartan, and Norvasc  3. Insulin-dependent DM  - A1c  was 13% in November 2018  - Managed at home with Lantus 20 units qHS and Humalog 8 units TID  - Check CBG's, continue Lantus with 15 units qHS and Novolog per sliding-scale   4. CKD stage III  - SCr is 1.39 on admission, consistent with apparent baseline  - Renally-dose medications as needed, avoid nephrotoxins where possible    5. Microcytic anemia  - Hgb is stable on admission at 9.1  - Denies melena or hematochezia  - She will continue iron-supplements   6. Chronic combined CHF  - Bilateral leg edema noted, worse over past few days per patient  -  No dyspnea or rales on exam   - Will treat with a dose of IV Lasix now, then resume her usual oral dose  - Follow I/O's and daily wts, SLIV   - Continue Coreg, losartan, Lasix   7. Paroxysmal atrial fibrillation  - In a sinus rhythm on admission  - CHADS-VASc is 7 (CVA x2, gender, HTN, DM, CAD, CHF)  - Check INR and continue warfarin    DVT prophylaxis: warfarin  Code Status: Full  Family Communication: Family updated at bedside Disposition Plan: Observe on telemetry   Consults called: Cardiology  Admission status: Inpatient    Vianne Bulls, MD [facility] Pager [phone]  If 7PM-7AM, please contact night-coverage [email or web address] Password [email or web address]  09/07/2017, 5:15 AM

## 2017-09-07 NOTE — ED Notes (Signed)
Pt's CBG 235. Values not crossing over into system.

## 2017-09-07 NOTE — Progress Notes (Signed)
ANTICOAGULATION CONSULT NOTE - Initial Consult  Pharmacy Consult for Coumadin Indication: atrial fibrillation  Allergies  Allergen Reactions  . Ace Inhibitors Cough    Vital Signs: Temp: 98.1 F (36.7 C) (01/28 2225) Temp Source: Oral (01/28 2225) BP: 201/102 (01/28 2225) Pulse Rate: 87 (01/28 2225)  Labs: Recent Labs    09/06/17 2239 09/07/17 0458  HGB 9.1*  --   HCT 28.9*  --   PLT 334  --   LABPROT  --  13.4  INR  --  1.03  CREATININE 1.39*  --     Medical History: Past Medical History:  Diagnosis Date  . Anemia   . CHF (congestive heart failure) ([place])   . Coronary artery disease   . GERD (gastroesophageal reflux disease)   . Heart murmur   . Hyperlipidemia   . Hypertension   . Stroke ([place] [facility] [place]) 09/2014   numbness left upper lip, finger tips on left hand; "resolved" (05/18/2016)  . Type II diabetes mellitus ([place])     Assessment: 62yo female c/o CP associated w/ SOB that spontaneously resolved, initial istat troponin negative, admitted for further w/u, to continue Coumadin for Afib; current INR below goal w/ last dose of Coumadin taken 1/27.  Goal of Therapy:  INR 2-3   Plan:  Will give boosted Coumadin dose of 12.5mg  po x1 today and monitor INR for dose adjustments; if rules in for ACS consider heparin.  Wynona Neat, PharmD, BCPS  09/07/2017,6:01 AM

## 2017-09-07 NOTE — ED Notes (Signed)
Checked patient cbg it 65 notified RN of blood sugar patient is resting with family at bedside

## 2017-09-07 NOTE — ED Notes (Signed)
Hospitalist at bedside in hallway at this time. Patient to be placed in C25 after MD consultation.

## 2017-09-07 NOTE — Consult Note (Signed)
Cardiology Consultation:   Patient ID: Amber Young; [id number]; 1956/03/14   Admit date: 09/07/2017 Date of Consult: 09/07/2017  Primary Care Provider: Scot Jun, FNP Primary Cardiologist:Dr. Bronson Ing (Patient lives in [place])   Patient Profile:   Amber Young is a 62 y.o. female with a hx of CAD s/p stenting to LAD 05/2016, PAF on warfarin, CVA, Accelerated HTN,  DM and chronic combined CHF who is being seen today for the evaluation of chest pain at the request of Dr. Wynelle Cleveland.   She had low risk stress test 12/2015.  However echocardiogram 04/2016 showed a few new wall motion abnormality.  Follow-up cardiac catheterization October 2017 showed 80% mid LAD stenosis status post PTCA and DES placement.  Medical therapy for residual mild to moderate disease elsewhere.  Detailed cath as below.  She was hospitalized for acute on chronic combined systolic and diastolic heart failure in July 2018. It was thought secondary to noncompliance with diet and fluid intake.  Last seen by Dr. Bronson Ing August 2018.  She was doing relatively well from cardiac standpoint.  Plan to reduce Brilinta to 60 mg twice a day after October 2018.  However, she is still taking Brilinta 90 mg twice a day.  History of Present Illness:   Amber Young presented for evaluation of worsening chest tightness for a few months.  Patient describes intermittent substernal chest tightness with and without exertion.  Associated with palpitation, stress and anxiety.  Symptoms resolved spontaneously within a few minutes.  However for the past few days, it is getting more frequent and intense.  Last night, patient was sitting and had sudden onset substernal chest tightness "worse episode "associated with nausea leading to further evaluation.  Her episode lasted for approximately 5-10 minutes.  Resolved prior to ER arrival.  Patient states that her symptoms is similar to when she had a stent placement in 2017.  Endorsed  compliant with medication.  Patient having mild orthopnea without PND or syncope.  No melena or blood in his stool or urine.  In ER patient noted hypertensive with blood pressure of 200/100.  Blood sugar running high.  Chest x-ray showed cardiomegaly without acute abnormality.  Point-of-care troponin negative x 2.  Troponin less than 0.03.  INR 1.03.  EKG showed sinus rhythm with TWI in lead I and aVL and non specific ST depression inferiorly.  Similar to prior EKG-personally reviewed.  Patient remained chest pain-free since admit.  He was given IV Lasix 40 mg x 1.  Past Medical History:  Diagnosis Date  . Anemia   . CHF (congestive heart failure) ([place])   . Coronary artery disease   . GERD (gastroesophageal reflux disease)   . Heart murmur   . Hyperlipidemia   . Hypertension   . Stroke ([place]) 09/2014   numbness left upper lip, finger tips on left hand; "resolved" (05/18/2016)  . Type II diabetes mellitus ([place])     Past Surgical History:  Procedure Laterality Date  . CARDIAC CATHETERIZATION N/A 05/18/2016   Procedure: Left Heart Cath and Coronary Angiography;  Surgeon: Jettie Booze, MD;  Location: [facility];  Service: Cardiovascular;  Laterality: N/A;  . CARDIAC CATHETERIZATION N/A 05/18/2016   Procedure: Coronary Stent Intervention;  Surgeon: Jettie Booze, MD;  Location: [facility];  Service: Cardiovascular;  Laterality: N/A;  . CORONARY ANGIOPLASTY    . TUBAL LIGATION  1980  . VAGINAL HYSTERECTOMY  1999   "partial; fibroids"    Inpatient Medications: Scheduled Meds: .  amLODipine  10 mg Oral Daily  . atorvastatin  40 mg Oral q1800  . carvedilol  50 mg Oral BID WC  . [START ON 09/08/2017] furosemide  40 mg Oral Daily  . insulin aspart  0-15 Units Subcutaneous Q4H  . insulin glargine  20 Units Subcutaneous Daily  . losartan  50 mg Oral BID  . potassium chloride  40 mEq Oral Once  . ticagrelor  90 mg Oral BID  . warfarin  12.5 mg Oral ONCE-1800  .  Warfarin - Pharmacist Dosing Inpatient   Does not apply q1800   Continuous Infusions:  PRN Meds: acetaminophen, ALPRAZolam, HYDROcodone-acetaminophen, morphine injection, nitroGLYCERIN, ondansetron (ZOFRAN) IV  Allergies:    Allergies  Allergen Reactions  . Ace Inhibitors Cough    Social History:   Social History   Socioeconomic History  . Marital status: Married    Spouse name: Not on file  . Number of children: Not on file  . Years of education: 37  . Highest education level: Not on file  Social Needs  . Financial resource strain: Not on file  . Food insecurity - worry: Not on file  . Food insecurity - inability: Not on file  . Transportation needs - medical: Not on file  . Transportation needs - non-medical: Not on file  Occupational History  . Occupation: hairdresser  Tobacco Use  . Smoking status: Never Smoker  . Smokeless tobacco: Never Used  Substance and Sexual Activity  . Alcohol use: No    Alcohol/week: 0.0 oz  . Drug use: No  . Sexual activity: Yes  Other Topics Concern  . Not on file  Social History Narrative   Married, 2 children, hairdresser   Caffeine use- occasional tea or coffee   Right handed    Family History:    Family History  Problem Relation Age of Onset  . Diabetes Mother   . Hypertension Mother   . COPD Mother   . Heart failure Mother   . Hypertension Sister   . Hypertension Brother      ROS:  Please see the history of present illness.  All other ROS reviewed and negative.     Physical Exam/Data:   Vitals:   09/06/17 2222 09/06/17 2225 09/07/17 0615 09/07/17 0656  BP:  (!) 201/102 (!) 187/90 (!) 182/83  Pulse:  87 74 65  Resp:  18 18 16   Temp:  98.1 F (36.7 C)    TempSrc:  Oral    SpO2:  97% 100% 100%  Height: 5\' 6"  (1.676 m)      No intake or output data in the 24 hours ending 09/07/17 0858 There were no vitals filed for this visit. Body mass index is 27.54 kg/m.  General:  Well nourished, well developed, in no  acute distress Lymph: no adenopathy Neck: + JVD Endocrine:  No thryomegaly Vascular: No carotid bruits; FA pulses 2+ bilaterally without bruits  Cardiac:  normal S1, S2; RRR; no murmur  Lungs: Faint bibasilar rales Abd: soft, nontender, no hepatomegaly  Ext: Trace ankle edema Musculoskeletal:  No deformities, BUE and BLE strength normal and equal Skin: warm and dry  Neuro:  CNs 2-12 intact, no focal abnormalities noted Psych:  Normal affect   Relevant CV Studies:  Coronary Stent Intervention   05/2016  Left Heart Cath and Coronary Angiography  Conclusion     Prox RCA lesion, 50 %stenosed. Some catheter dampening noted at times but this may have been related to catheter induced spasm based  on the angiogram.  Ost RCA lesion, 50 %stenosed.  Ost 2nd Diag to 2nd Diag lesion, 80 %stenosed.  Mid LAD-1 lesion, 25 %stenosed.  1st Diag lesion, 25 %stenosed.  Mid LAD-2 lesion, 80 %stenosed. A STENT PROMUS PREM MR 3.0X24 drug eluting stent was successfully placed, and overlaps previously placed stent, postdilated to 3.25 mm.  Post intervention, there is a 0% residual stenosis.  The left ventricular ejection fraction is 40-45% by visual estimate.  There is mild left ventricular systolic dysfunction.  LV end diastolic pressure is moderately elevated.   Dual antiplatelet therapy for 12 months.  Continue aggressive secondary prevention.  During the procedure, the patient had paroxysmal SVT episodes. Will watch overnight to monitor her on telemetry.   Echo 04/2016 Study Conclusions  - Left ventricle: The cavity size was normal. Wall thickness was   increased increased in a pattern of mild to moderate LVH.   Systolic function was mildly to moderately reduced. The estimated   ejection fraction was in the range of 40% to 45%. Doppler   parameters are consistent with restrictive physiology, indicative   of decreased left ventricular diastolic compliance and/or   increased left atrial  pressure. - Regional wall motion abnormality: Hypokinesis of the mid   anterior, basal-mid anteroseptal, and mid anterolateral   myocardium. - Aortic valve: Mildly calcified annulus. Trileaflet; mildly   thickened leaflets. Valve area (VTI): 2.72 cm^2. Valve area   (Vmax): 2.64 cm^2. Valve area (Vmean): 2.23 cm^2. - Mitral valve: Mildly calcified annulus. Mildly thickened leaflets   . There was mild regurgitation. - Left atrium: The atrium was moderately dilated. - Right ventricle: Systolic function was mildly reduced. - Right atrium: The atrium was mildly dilated. - Atrial septum: No defect or patent foramen ovale was identified. - Tricuspid valve: There was moderate regurgitation. - Pulmonary arteries: Systolic pressure was mildly increased. PA   peak pressure: 36 mm Hg (S). - Technically adequate study.  Laboratory Data:  Chemistry Recent Labs  Lab 09/06/17 2239 09/07/17 0745  NA 137 139  K 3.8 3.3*  CL 105 105  CO2 23 23  GLUCOSE 354* 237*  BUN 16 16  CREATININE 1.39* 1.27*  CALCIUM 8.4* 8.9  GFRNONAA 40* 45*  GFRAA 46* 52*  ANIONGAP 9 11    No results for input(s): PROT, ALBUMIN, AST, ALT, ALKPHOS, BILITOT in the last 168 hours. Hematology Recent Labs  Lab 09/06/17 2239 09/07/17 0745  WBC 5.0 5.5  RBC 3.70* 4.05  HGB 9.1* 10.1*  HCT 28.9* 31.2*  MCV 78.1 77.0*  MCH 24.6* 24.9*  MCHC 31.5 32.4  RDW 14.9 14.5  PLT 334 359   Cardiac Enzymes Recent Labs  Lab 09/07/17 0542  TROPONINI <0.03    Recent Labs  Lab 09/06/17 2321 09/07/17 0527  TROPIPOC 0.02 0.02   Radiology/Studies:  Dg Chest 2 View  Result Date: 09/06/2017 CLINICAL DATA:  Chest pain, nausea EXAM: CHEST  2 VIEW COMPARISON:  02/08/2017 FINDINGS: Cardiomegaly.  Lungs clear.  No effusions or acute bony abnormality. IMPRESSION: Cardiomegaly.  No active disease. Electronically Signed   By: Rolm Baptise M.D.   On: 09/06/2017 23:21    Assessment and Plan:   1. Chest tightness with symptoms  concerning for unstable angina - Mixed symptoms. Difficult to evaluate etiology. Worsen in past few days with worse episode last night leading to ER presentation. Says similar to prior chest pain when had stent placement. Differential includes arrhthymias, unstable angina, GERD, CHF vs stress/anxiety.  - Troponin has  been negative. EKG without acute changes.   2. CAD s/p DES to mLAD 05/2016 - Plan to reduce Brilintta to 60mg  BID in 05/2017 however never done. Not on ASA due to need to anticoagulation.   3.PAF - Intermittent palpitation during chest tightness. INR of 1.03. CHADSVASC score of 7. Monitor on telemetry.  - warfarin per pharmacy.  4. Hypertensive urgency - Long standing hx of difficult to control high blood pressure. She had normal renal US 12/2016. - Resumed home Norvasc 10mg  Qd, Coreg 50mg  BID and Losartan 50mg  BID. May add hydralazine as needed.  5. Acute on chronic combined CHF - CXR clear. Mild edema. Given IV lasix 40mg  x1. Resume home lasix 40mg  po daily from tomorrow.  - Pending repeat echo this admission.   6. Hypokalemia - Supplement given  7. CKD stage III - Renal function stable  Reviewed with Dr. Burt Knack. Will do cardiac cath today for definite evaluation of coronary anatomy. INR 1.03 this morning. The patient agreeable with plan.    For questions or updates, please contact [place] Please consult [email or web address] for contact info under Cardiology/STEMI.   Jarrett Soho, [place]  09/07/2017 8:58 AM   Patient seen, examined. Available data reviewed. Agree with findings, assessment, and plan as outlined by Robbie Lis, PA-C.   On my exam this morning: Vitals:   09/07/17 1024 09/07/17 1030  BP:  (!) 137/53  Pulse: (!) 55 (!) 54  Resp: 20 14  Temp:    SpO2: 100% 100%   Pt is alert and oriented, pleasant woman in NAD HEENT: normal Neck: JVP - normal, carotids 2+= without bruits Lungs: CTA bilaterally CV: RRR without murmur or gallop Abd:  soft, NT, Positive BS, no hepatomegaly Ext: there is 1+ pretibial edema bilaterally, distal pulses intact and equal Skin: warm/dry no rash  The patient's history is carefully reviewed.  I reviewed previous cardiac testing including her echocardiogram, nuclear stress test, and cardiac catheterization all done in 2017.  The patient now presents with symptoms of progressive chest pain and fatigue.  It is somewhat difficult to sort out typical and atypical features of her presentation from a cardiac perspective.  However, she previously had an evaluation with a nuclear scan that was unremarkable followed by a cardiac catheterization just a few months later demonstrating multivessel coronary artery disease requiring stenting of the LAD.  Her symptom complex is concerning for unstable angina pectoris with increasing chest pain at rest and exertion over recent weeks. Considering her ongoing risk factors, most notably uncontrolled diabetes, I would favor definitive evaluation now greater than 1 year out from her previous PCI.  I think she is at high risk of progressive coronary obstruction as well as in-stent restenosis.  She also has signs of acute on chronic combined systolic and diastolic heart failure.  While her respiratory status looks good and lung fields are clear, she has worsening fatigue and leg edema.  She has already received 1 dose of IV furosemide.  She has had uncontrolled hypertension and will need careful titration of her heart failure regimen.  She previously was noted to have restrictive physiology on her echocardiogram from September 2017.  Will update an echocardiogram during this admission. I have reviewed the risks, indications, and alternatives to cardiac catheterization, possible angioplasty, and stenting with the patient. Risks include but are not limited to bleeding, infection, vascular injury, stroke, myocardial infection, arrhythmia, kidney injury, radiation-related injury in the case of  prolonged fluoroscopy use, emergency cardiac surgery, and death. The  patient understands the risks of serious complication is 1-2 in 4081 with diagnostic cardiac cath and 1-2% or less with angioplasty/stenting.   Sherren Mocha, M.D. 09/07/2017 10:46 AM

## 2017-09-07 NOTE — Progress Notes (Signed)
[facility]  62 y/o female admitted for chest pain.  PMH: CAD s/p DES 10/27, PAF, HTN, DM2, CKD 3 and combined CHF.   I have evaluated her this AM. She is having waxing and waning chest pain. I have asked cardiology for an eval as she has a h/o CAD with stenting and further co-morbidities including HTN and DM2.  Debbe Odea, MD

## 2017-09-07 NOTE — Care Management Note (Signed)
Case Management Note  Patient Details  Name: Deashia Soule MRN: [id number] Date of Birth: [birth year]  Subjective/Objective:    From home, s/p coronary stent intervention, will cont on brilinta, which she was on pta.                  Action/Plan: NCM will follow for dc needs.   Expected Discharge Date:                  Expected Discharge Plan:  Home/Self Care  In-House Referral:     Discharge planning Services     Post Acute Care Choice:    Choice offered to:     DME Arranged:    DME Agency:     HH Arranged:    [place] Agency:     Status of Service:  Completed, signed off  If discussed at [employer] of Stay Meetings, dates discussed:    Additional Comments:  [name], RN 09/07/2017, 2:10 PM

## 2017-09-08 ENCOUNTER — Inpatient Hospital Stay (HOSPITAL_COMMUNITY): Payer: BLUE CROSS/BLUE SHIELD

## 2017-09-08 DIAGNOSIS — I361 Nonrheumatic tricuspid (valve) insufficiency: Secondary | ICD-10-CM

## 2017-09-08 DIAGNOSIS — I5043 Acute on chronic combined systolic (congestive) and diastolic (congestive) heart failure: Secondary | ICD-10-CM | POA: Diagnosis not present

## 2017-09-08 DIAGNOSIS — I25110 Atherosclerotic heart disease of native coronary artery with unstable angina pectoris: Secondary | ICD-10-CM | POA: Diagnosis not present

## 2017-09-08 DIAGNOSIS — E1122 Type 2 diabetes mellitus with diabetic chronic kidney disease: Secondary | ICD-10-CM | POA: Diagnosis not present

## 2017-09-08 DIAGNOSIS — I160 Hypertensive urgency: Secondary | ICD-10-CM

## 2017-09-08 DIAGNOSIS — I2510 Atherosclerotic heart disease of native coronary artery without angina pectoris: Secondary | ICD-10-CM

## 2017-09-08 DIAGNOSIS — N183 Chronic kidney disease, stage 3 (moderate): Secondary | ICD-10-CM

## 2017-09-08 DIAGNOSIS — Z888 Allergy status to other drugs, medicaments and biological substances status: Secondary | ICD-10-CM | POA: Diagnosis not present

## 2017-09-08 DIAGNOSIS — Z79899 Other long term (current) drug therapy: Secondary | ICD-10-CM | POA: Diagnosis not present

## 2017-09-08 DIAGNOSIS — E11649 Type 2 diabetes mellitus with hypoglycemia without coma: Secondary | ICD-10-CM | POA: Diagnosis not present

## 2017-09-08 DIAGNOSIS — E1165 Type 2 diabetes mellitus with hyperglycemia: Secondary | ICD-10-CM | POA: Diagnosis present

## 2017-09-08 DIAGNOSIS — Z7901 Long term (current) use of anticoagulants: Secondary | ICD-10-CM | POA: Diagnosis not present

## 2017-09-08 DIAGNOSIS — Z9861 Coronary angioplasty status: Secondary | ICD-10-CM

## 2017-09-08 DIAGNOSIS — Z955 Presence of coronary angioplasty implant and graft: Secondary | ICD-10-CM | POA: Diagnosis not present

## 2017-09-08 DIAGNOSIS — Z599 Problem related to housing and economic circumstances, unspecified: Secondary | ICD-10-CM | POA: Diagnosis not present

## 2017-09-08 DIAGNOSIS — Z794 Long term (current) use of insulin: Secondary | ICD-10-CM | POA: Diagnosis not present

## 2017-09-08 DIAGNOSIS — I4581 Long QT syndrome: Secondary | ICD-10-CM | POA: Diagnosis present

## 2017-09-08 DIAGNOSIS — D509 Iron deficiency anemia, unspecified: Secondary | ICD-10-CM

## 2017-09-08 DIAGNOSIS — E876 Hypokalemia: Secondary | ICD-10-CM | POA: Diagnosis not present

## 2017-09-08 DIAGNOSIS — R739 Hyperglycemia, unspecified: Secondary | ICD-10-CM | POA: Diagnosis present

## 2017-09-08 DIAGNOSIS — E785 Hyperlipidemia, unspecified: Secondary | ICD-10-CM | POA: Diagnosis present

## 2017-09-08 DIAGNOSIS — Z8673 Personal history of transient ischemic attack (TIA), and cerebral infarction without residual deficits: Secondary | ICD-10-CM | POA: Diagnosis not present

## 2017-09-08 DIAGNOSIS — I480 Paroxysmal atrial fibrillation: Secondary | ICD-10-CM

## 2017-09-08 DIAGNOSIS — R079 Chest pain, unspecified: Secondary | ICD-10-CM | POA: Diagnosis not present

## 2017-09-08 DIAGNOSIS — D631 Anemia in chronic kidney disease: Secondary | ICD-10-CM | POA: Diagnosis present

## 2017-09-08 DIAGNOSIS — E0865 Diabetes mellitus due to underlying condition with hyperglycemia: Secondary | ICD-10-CM

## 2017-09-08 DIAGNOSIS — I10 Essential (primary) hypertension: Secondary | ICD-10-CM | POA: Diagnosis not present

## 2017-09-08 DIAGNOSIS — I130 Hypertensive heart and chronic kidney disease with heart failure and stage 1 through stage 4 chronic kidney disease, or unspecified chronic kidney disease: Secondary | ICD-10-CM | POA: Diagnosis not present

## 2017-09-08 LAB — GLUCOSE, CAPILLARY
Glucose-Capillary: 57 mg/dL — ABNORMAL LOW (ref 65–99)
Glucose-Capillary: 96 mg/dL (ref 65–99)
Glucose-Capillary: 151 mg/dL — ABNORMAL HIGH (ref 65–99)
Glucose-Capillary: 140 mg/dL — ABNORMAL HIGH (ref 65–99)

## 2017-09-08 LAB — PROTIME-INR
INR: 1.05
Prothrombin Time: 13.7 seconds (ref 11.4–15.2)

## 2017-09-08 LAB — BASIC METABOLIC PANEL
ANION GAP: 8 (ref 5–15)
BUN: 20 mg/dL (ref 6–20)
CHLORIDE: 109 mmol/L (ref 101–111)
CO2: 23 mmol/L (ref 22–32)
Calcium: 8.3 mg/dL — ABNORMAL LOW (ref 8.9–10.3)
Creatinine, Ser: 1.5 mg/dL — ABNORMAL HIGH (ref 0.44–1.00)
GFR calc non Af Amer: 36 mL/min — ABNORMAL LOW (ref >60)
GFR, EST AFRICAN AMERICAN: 42 mL/min — AB (ref >60)
Glucose, Bld: 50 mg/dL — ABNORMAL LOW (ref 65–99)
POTASSIUM: 3.5 mmol/L (ref 3.5–5.1)
SODIUM: 140 mmol/L (ref 135–145)

## 2017-09-08 LAB — CBC
HEMATOCRIT: 25.4 % — AB (ref 36.0–46.0)
HEMOGLOBIN: 8.4 g/dL — AB (ref 12.0–15.0)
MCH: 25 pg — ABNORMAL LOW (ref 26.0–34.0)
MCHC: 33.1 g/dL (ref 30.0–36.0)
MCV: 75.6 fL — AB (ref 78.0–100.0)
Platelets: 302 10*3/uL (ref 150–400)
RBC: 3.36 MIL/uL — AB (ref 3.87–5.11)
RDW: 14.9 % (ref 11.5–15.5)
WBC: 5.8 10*3/uL (ref 4.0–10.5)

## 2017-09-08 LAB — ECHOCARDIOGRAM COMPLETE: WEIGHTICAEL: 2910.4 [oz_av]

## 2017-09-08 MED ORDER — NITROGLYCERIN 0.4 MG SL SUBL: 0.4000 mg | SUBLINGUAL_TABLET | SUBLINGUAL | 12 refills | Status: DC | PRN

## 2017-09-08 MED ORDER — INSULIN STARTER KIT- PEN NEEDLES (ENGLISH)
1.0000 | Freq: Once | Status: AC
  Administered 2017-09-08: 1
  Filled 2017-09-08 (×2): qty 1

## 2017-09-08 MED ORDER — LIVING WELL WITH DIABETES BOOK
Freq: Once | Status: AC
  Administered 2017-09-08: 13:00:00
  Filled 2017-09-08: qty 1

## 2017-09-08 MED ORDER — ASPIRIN EC 81 MG PO TBEC: 81.0000 mg | DELAYED_RELEASE_TABLET | Freq: Every day | ORAL | 2 refills | Status: DC

## 2017-09-08 MED ORDER — INSULIN GLARGINE 100 UNIT/ML ~~LOC~~ SOLN
10.0000 [IU] | Freq: Once | SUBCUTANEOUS | Status: AC
  Administered 2017-09-08: 10 [IU] via SUBCUTANEOUS
  Filled 2017-09-08: qty 0.1

## 2017-09-08 NOTE — Discharge Instructions (Addendum)

## 2017-09-08 NOTE — Progress Notes (Signed)
Progress Note  Patient Name: Amber Young Date of Encounter: 09/08/2017  Primary Cardiologist: Dwana Curd   Subjective   Feeling well this morning, walked with cardiac rehab.   Inpatient Medications    Scheduled Meds: . amLODipine  10 mg Oral Daily  . atorvastatin  40 mg Oral q1800  . carvedilol  50 mg Oral BID WC  . furosemide  40 mg Oral Daily  . insulin aspart  0-15 Units Subcutaneous TID WC  . insulin aspart  0-5 Units Subcutaneous QHS  . insulin glargine  20 Units Subcutaneous Daily  . losartan  50 mg Oral BID  . sodium chloride flush  3 mL Intravenous Q12H  . sodium chloride flush  3 mL Intravenous Q12H  . ticagrelor  90 mg Oral BID  . Warfarin - Pharmacist Dosing Inpatient   Does not apply q1800   Continuous Infusions: . sodium chloride    . sodium chloride     PRN Meds: sodium chloride, sodium chloride, acetaminophen, ALPRAZolam, HYDROcodone-acetaminophen, morphine injection, nitroGLYCERIN, ondansetron (ZOFRAN) IV, sodium chloride flush, sodium chloride flush   Vital Signs    Vitals:   09/07/17 2100 09/08/17 0500 09/08/17 0544 09/08/17 0729  BP: (!) 179/85 (!) 179/90 (!) 170/90 (!) 168/56  Pulse: 63 61 69 78  Resp: 13 14 15 16   Temp:   97.8 F (36.6 C) 98.3 F (36.8 C)  TempSrc:   Oral Oral  SpO2: 100% 100% 100% 100%  Weight:   181 lb 14.4 oz (82.5 kg)   Height:        Intake/Output Summary (Last 24 hours) at 09/08/2017 0960 Last data filed at 09/08/2017 4540 Gross per 24 hour  Intake 1960 ml  Output -  Net 1960 ml   Filed Weights   09/08/17 0544  Weight: 181 lb 14.4 oz (82.5 kg)    Telemetry    SR - Personally Reviewed  ECG    SR with inferolateral TWI - Personally Reviewed  Physical Exam   General: Well developed, well nourished, female appearing in no acute distress. Head: Normocephalic, atraumatic.  Neck: Supple without bruits, JVD. Lungs:  Resp regular and unlabored, CTA. Heart: RRR, S1, S2, no S3, S4, or murmur; no  rub. Abdomen: Soft, non-tender, non-distended with normoactive bowel sounds.  Extremities: No clubbing, cyanosis, edema. Distal pedal pulses are 2+ bilaterally. Right radial cath site stable. Neuro: Alert and oriented X 3. Moves all extremities spontaneously. Psych: Normal affect.  Labs    Chemistry Recent Labs  Lab 09/06/17 2239 09/07/17 0745 09/08/17 0424  NA 137 139 140  K 3.8 3.3* 3.5  CL 105 105 109  CO2 23 23 23   GLUCOSE 354* 237* 50*  BUN 16 16 20   CREATININE 1.39* 1.27* 1.50*  CALCIUM 8.4* 8.9 8.3*  GFRNONAA 40* 45* 36*  GFRAA 46* 52* 42*  ANIONGAP 9 11 8      Hematology Recent Labs  Lab 09/06/17 2239 09/07/17 0745 09/08/17 0424  WBC 5.0 5.5 5.8  RBC 3.70* 4.05 3.36*  HGB 9.1* 10.1* 8.4*  HCT 28.9* 31.2* 25.4*  MCV 78.1 77.0* 75.6*  MCH 24.6* 24.9* 25.0*  MCHC 31.5 32.4 33.1  RDW 14.9 14.5 14.9  PLT 334 359 302    Cardiac Enzymes Recent Labs  Lab 09/07/17 0542 09/07/17 1445 09/07/17 2119  TROPONINI <0.03 0.03* 0.57*    Recent Labs  Lab 09/06/17 2321 09/07/17 0527  TROPIPOC 0.02 0.02     BNPNo results for input(s): BNP, PROBNP in the last 168 hours.  DDimer No results for input(s): DDIMER in the last 168 hours.    Radiology    Dg Chest 2 View  Result Date: 09/06/2017 CLINICAL DATA:  Chest pain, nausea EXAM: CHEST  2 VIEW COMPARISON:  02/08/2017 FINDINGS: Cardiomegaly.  Lungs clear.  No effusions or acute bony abnormality. IMPRESSION: Cardiomegaly.  No active disease. Electronically Signed   By: Rolm Baptise M.D.   On: 09/06/2017 23:21    Cardiac Studies   Cath: 09/07/17  Conclusion     Prox RCA lesion is 75% stenosed.  A drug-eluting stent was successfully placed using a STENT PROMUS PREM MR 2.75X16.  Post intervention, there is a 5% residual stenosis.  Ost RCA lesion is 85% stenosed.  A drug-eluting stent was successfully placed using a STENT PROMUS PREM MR 3.0X12. Overlaps Stent 1 proximally.  Post intervention, there  is a 0% residual stenosis.  Mid RCA-1 lesion is 30% stenosed. Mid RCA-2 lesion is 25% stenosed.  Ost 1st Diag lesion is 30% stenosed. Ost 2nd Diag to 2nd Diag lesion is 80% stenosed. Stable.  Prox LAD lesion is 40% stenosed. Previously placed Mid LAD Promus drug eluting stent is widely patent.  There is mild left ventricular systolic dysfunction. The left ventricular ejection fraction is 45-50% by visual estimate.  LV end diastolic pressure is mildly elevated.   Severe Ostial & proximal RCA stenosis (progression of disease from Oct 2017).  Successful PCI using 2 overlapping DES.  Difficult, ostial lesion PCI requiring multiple balloons, buddy wire & extra support wire.  Plan:   Overnight monitoring in the posterior unit (6C)  TR band removal per protocol  Continue DAPT -at least 1 more year\  Continue aggressive risk factor modification.  Expect discharge tomorrow.    Patient Profile     62 y.o. female with PMH of CAD s/p stenting to LAD 05/2016, PAF on warfarin, CVA, Accelerated HTN,  DM and chronic combined CHF who presented with chest pain.   Assessment & Plan    1. Chest pain: Underwent cardiac cath yesterday with successful PCI/DESx2 to pRCA. Plan for triple therapy with ASA, Brilinta and coumadin for one month. Then stop ASA. Has been on Brilinta prior to admission 2/2 financial issues. Worked well with cardiac rehab. Will arrange for outpatient follow up.   2. PAF: SR on telemetry. On [place] with coumadin. Ok to restart today  3. Accelerated HTN: Bp has been elevated at times this admission. Improved this morning. Will continue same regimen.  4. IDDM: Had episode of hypoglycemia this morning. States she takes her insulin different at home than is scheduled here. Asymptomatic.   5. HL: on statin therapy   Signed, Reino Bellis, NP  09/08/2017, 9:27 AM  Pager # [phone]   For questions or updates, please contact [place] Please consult [email or web address] for  contact info under Cardiology/STEMI.  I have personally seen and examined this patient. I agree with the assessment and plan as outlined above.  She is doing well this am. Admitted with unstable angina. Cardiac cath with severe stenosis in the RCA treated with DES x 2. CAD stable otherwise. Will plan to use ASA/Brlinta and coumadin for one month the drop ASA and continue Brilinta/coumadin. OK to d/c home today.  We will have her keep f/u with DR. Koneswaran in 2 weeks.   Lauree Chandler 09/08/2017 10:05 AM

## 2017-09-08 NOTE — Discharge Summary (Addendum)
Physician Discharge Summary  Amber Young TIW:[id number] DOB: 08/02/1956 DOA: 09/07/2017  PCP: Scot Jun, FNP  Admit date: 09/07/2017 Discharge date: 09/08/2017  Admitted From: home Disposition:  home   Recommendations for Outpatient Follow-up:  1. F/u with cardiology  Discharge Condition:  stable   CODE STATUS:  Full code   Consultations:  Cardiology     Discharge Diagnoses:  Principal Problem:   Chest pain at rest Active Problems:   Uncontrolled hypertension   Uncontrolled diabetes mellitus ([place])   Acute on chronic combined systolic (congestive) and diastolic (congestive) heart failure (HCC)   Microcytic anemia   CAD S/P PCI mLAD DES (Promus 3.0 x 24 -- 3.25 mm)   PAF (paroxysmal atrial fibrillation) (HCC)   CKD (chronic kidney disease), stage III (HCC)   Hypertensive urgency   Chest pain   Unstable angina (HCC)   Coronary artery disease involving native coronary artery of native heart with unstable angina pectoris (HCC)    Subjective: No complaints of chest pain, nausea, vomiting. No other complaints.   Brief Summary: Amber Young is a 62 y.o. female with medical history significant for coronary artery disease with stent, hypertension, insulin-dependent diabetes mellitus, paroxysmal atrial fibrillation on Coumadin, and chronic combined systolic/diastolic CHF, now presenting to the emergency department for evaluation of chest tightness and shortness of breath.  Patient reports that she had been experiencing occasional chest tightness and dyspnea over the past few months, usually with exertion or during times of psychosocial stress.  Over the past couple days, these episodes have been more frequent and more severe.  Overnight, she experienced a episode that began while at rest marked by a "tightness" involving the central chest and associated with mild dyspnea and mild nausea without vomiting.  The symptoms resolve spontaneously prior to arrival in the  emergency department.  She reports that it lasted approximately 10-15 minutes and was similar to her experiences that led up to her stent placement in October 2017.  She denies recent fevers or chills.  Denies diaphoresis.    Hospital Course:    Chest pain; CAD - unstable angina - Presents with SOB and chest tightness, onset at rest, resolved spontaneously prior to admission  - CXR is unremarkable and initial troponin is negative; EKG features non-specific abnormalities in lateral leads that are similar to prior   - She was treated with ASA 324 mg in ED and is anticoagulated with warfarin  - taken for cath on 1/29 and received 2 stents to RCA for a 75% and an 85% stenosis - cardiology recommending DAPT for now- per cardiology, she is stable to go home today- outpt f/u with cards- needs aggressive control of co- morbidities to prevent further stenosis   Hypertension with hypertensive urgency  - BP elevated to 200/100 range in ED  - Treat acutely with Lopressor given the chest pain  - resumed home doses of Coreg, losartan, and Norvasc- BP now is stable    Insulin-dependent DM  - A1c was 13% in November 2018  - Managed at home with Lantus 20 units qHS, 10 U in AM and Humalog 8 units TID      CKD stage III  - SCr is 1.39 on admission, consistent with apparent baseline      Microcytic anemia  - Hgb is stable on admission at 9.1  - Denies melena or hematochezia  - She will continue iron-supplements     Chronic combined CHF  - Bilateral leg edema noted, worse over past few days  per patient  - No dyspnea or rales on exam   - Continue Coreg, losartan, Lasix    Paroxysmal atrial fibrillation  - In a sinus rhythm on admission  - CHADS-VASc is 7 (CVA x2, gender, HTN, DM, CAD, CHF)  -  continue warfarin    Discharge Exam: Vitals:   09/08/17 0544 09/08/17 0729  BP: (!) 170/90 (!) 168/56  Pulse: 69 78  Resp: 15 16  Temp: 97.8 F (36.6 C) 98.3 F (36.8 C)  SpO2: 100% 100%    Vitals:   09/07/17 2100 09/08/17 0500 09/08/17 0544 09/08/17 0729  BP: (!) 179/85 (!) 179/90 (!) 170/90 (!) 168/56  Pulse: 63 61 69 78  Resp: 13 14 15 16   Temp:   97.8 F (36.6 C) 98.3 F (36.8 C)  TempSrc:   Oral Oral  SpO2: 100% 100% 100% 100%  Weight:   82.5 kg (181 lb 14.4 oz)   Height:        General: Pt is alert, awake, not in acute distress Cardiovascular: RRR, S1/S2 +, no rubs, no gallops Respiratory: CTA bilaterally, no wheezing, no rhonchi Abdominal: Soft, NT, ND, bowel sounds + Extremities: no edema, no cyanosis   Discharge Instructions  Discharge Instructions    AMB Referral to Cardiac Rehabilitation - Phase II   Complete by:  As directed    Diagnosis:  Coronary Stents   Amb Referral to Cardiac Rehabilitation   Complete by:  As directed    Diagnosis:   Coronary Stents PTCA       Allergies as of 09/08/2017      Reactions   Ace Inhibitors Cough      Medication List    TAKE these medications   amLODipine 10 MG tablet Commonly known as:  NORVASC Take 1 tablet (10 mg total) by mouth daily.   aspirin EC 81 MG tablet Take 1 tablet (81 mg total) by mouth daily.   atorvastatin 40 MG tablet Commonly known as:  LIPITOR Take 1 tablet (40 mg total) by mouth daily at 6 PM.   BLOOD GLUCOSE TEST STRIPS Strp Use as directed   carvedilol 25 MG tablet Commonly known as:  COREG Take 2 tablets (50 mg total) by mouth 2 (two) times daily.   feeding supplement (GLUCERNA SHAKE) Liqd Take 237 mLs by mouth 3 (three) times daily between meals.   ferrous sulfate 325 (65 FE) MG tablet Commonly known as:  FERROUSUL Take 1 tablet (325 mg total) by mouth 3 (three) times daily with meals.   furosemide 40 MG tablet Commonly known as:  LASIX Take 1 tablet (40 mg total) by mouth daily.   insulin glargine 100 UNIT/ML injection Commonly known as:  LANTUS Inject 0.3 mLs (30 Units total) into the skin at bedtime. What changed:  how much to take   insulin lispro 100  UNIT/ML injection Commonly known as:  HUMALOG Inject 0.08 mLs (8 Units total) into the skin 3 (three) times daily with meals.   Insulin Pen Needle 29G X 10MM Misc Use as directed   INSULIN SYRINGE .5CC/29G 29G X 1/2" 0.5 ML Misc Use to administer insulin three times daily   losartan 50 MG tablet Commonly known as:  COZAAR Take 1 tablet (50 mg total) by mouth 2 (two) times daily.   potassium chloride 10 MEQ tablet Commonly known as:  K-DUR,KLOR-CON Take 1 tablet with each dose of furosemide, daily.   ticagrelor 90 MG Tabs tablet Commonly known as:  BRILINTA Take 1 tablet (90 mg  total) by mouth 2 (two) times daily.   warfarin 5 MG tablet Commonly known as:  COUMADIN Take as directed. If you are unsure how to take this medication, talk to your nurse or doctor. Original instructions:  Take 1 to 1.5 tablets by mouth daily as directed by coumadin clinic What changed:    how much to take  how to take this  when to take this  additional instructions       Allergies  Allergen Reactions  . Ace Inhibitors Cough     Procedures/Studies: Cardiac cath  Dg Chest 2 View  Result Date: 09/06/2017 CLINICAL DATA:  Chest pain, nausea EXAM: CHEST  2 VIEW COMPARISON:  02/08/2017 FINDINGS: Cardiomegaly.  Lungs clear.  No effusions or acute bony abnormality. IMPRESSION: Cardiomegaly.  No active disease. Electronically Signed   By: Rolm Baptise M.D.   On: 09/06/2017 23:21     The results of significant diagnostics from this hospitalization (including imaging, microbiology, ancillary and laboratory) are listed below for reference.     Microbiology: No results found for this or any previous visit (from the past 240 hour(s)).   Labs: BNP (last 3 results) Recent Labs    02/08/17 1018 02/08/17 1131 04/26/17 1013  BNP 3,261* 3,708.9* 921*   Basic Metabolic Panel: Recent Labs  Lab 09/06/17 2239 09/07/17 0745 09/08/17 0424  NA 137 139 140  K 3.8 3.3* 3.5  CL 105 105 109  CO2  23 23 23   GLUCOSE 354* 237* 50*  BUN 16 16 20   CREATININE 1.39* 1.27* 1.50*  CALCIUM 8.4* 8.9 8.3*   Liver Function Tests: No results for input(s): AST, ALT, ALKPHOS, BILITOT, PROT, ALBUMIN in the last 168 hours. No results for input(s): LIPASE, AMYLASE in the last 168 hours. No results for input(s): AMMONIA in the last 168 hours. CBC: Recent Labs  Lab 09/06/17 2239 09/07/17 0745 09/08/17 0424  WBC 5.0 5.5 5.8  HGB 9.1* 10.1* 8.4*  HCT 28.9* 31.2* 25.4*  MCV 78.1 77.0* 75.6*  PLT 334 359 302   Cardiac Enzymes: Recent Labs  Lab 09/07/17 0542 09/07/17 1445 09/07/17 2119  TROPONINI <0.03 0.03* 0.57*   BNP: Invalid input(s): POCBNP CBG: Recent Labs  Lab 09/07/17 1400 09/07/17 1615 09/07/17 2059 09/08/17 0620 09/08/17 0638  GLUCAP 75 256* 223* 57* 96   D-Dimer No results for input(s): DDIMER in the last 72 hours. Hgb A1c No results for input(s): HGBA1C in the last 72 hours. Lipid Profile No results for input(s): CHOL, HDL, LDLCALC, TRIG, CHOLHDL, LDLDIRECT in the last 72 hours. Thyroid function studies No results for input(s): TSH, T4TOTAL, T3FREE, THYROIDAB in the last 72 hours.  Invalid input(s): FREET3 Anemia work up No results for input(s): VITAMINB12, FOLATE, FERRITIN, TIBC, IRON, RETICCTPCT in the last 72 hours. Urinalysis    Component Value Date/Time   COLORURINE YELLOW 02/08/2017 1233   APPEARANCEUR HAZY (A) 02/08/2017 1233   LABSPEC 1.020 05/24/2017 1028   PHURINE 6.0 05/24/2017 1028   GLUCOSEU 250 (A) 05/24/2017 1028   HGBUR MODERATE (A) 05/24/2017 1028   BILIRUBINUR NEGATIVE 05/24/2017 1028   KETONESUR NEGATIVE 05/24/2017 1028   PROTEINUR >=300 (A) 05/24/2017 1028   UROBILINOGEN 0.2 05/24/2017 1028   NITRITE NEGATIVE 05/24/2017 1028   LEUKOCYTESUR NEGATIVE 05/24/2017 1028   Sepsis Labs Invalid input(s): PROCALCITONIN,  WBC,  LACTICIDVEN Microbiology No results found for this or any previous visit (from the past 240 hour(s)).   Time  coordinating discharge: Over 30 minutes  SIGNED:   Debbe Odea,  MD  [facility] 09/08/2017, 9:48 AM Pager   If 7PM-7AM, please contact night-coverage [email or web address] Password [email or web address]

## 2017-09-08 NOTE — Progress Notes (Signed)
Echocardiogram 2D Echocardiogram has been performed.  Amber Young 09/08/2017, 12:09 PM

## 2017-09-08 NOTE — Progress Notes (Signed)
CARDIAC REHAB PHASE I   PRE:  Rate/Rhythm: 70 SR  BP:  Supine: 137/49   Sitting:   Standing:    SaO2: 98 RA  MODE:  Ambulation: 1000 ft   POST:  Rate/Rhythm: 96 SR  BP:  Supine:   Sitting: 171/92 recheck after rest 132/52  Standing:    SaO2: 100 RA 0820-0930 Pt tolerated ambulation without c/o of cp. She had some DOE and took two short standing rest stops during walk which resolved it. Pt was able to walk 1000 feet. BP after walk 171/92 rechecked after rest 132/52. Completed discharge education with pt and daughter. We discussed stent, Brilinta, diabetic diet, exercise guidelines, angina, proper use of sl NTG, calling 911 and Outpt. CRP. She voices understanding. Referal to Outpt. CRP in [place].  Rodney Langton RN 09/08/2017 9:30 AM

## 2017-09-08 NOTE — Progress Notes (Signed)
Inpatient Diabetes Program Recommendations  AACE/ADA: New Consensus Statement on Inpatient Glycemic Control (2015)  Target Ranges:  Prepandial:   less than 140 mg/dL      Peak postprandial:   less than 180 mg/dL (1-2 hours)      Critically ill patients:  140 - 180 mg/dL   Lab Results  Component Value Date   GLUCAP 140 (H) 09/08/2017   HGBA1C 13.0 07/09/2017    Review of Glycemic Control  Inpatient Diabetes Program Recommendations:   Spoke with patient and daughter @ bedside @ length regarding insulin administration. Patient has not been taking Lantus in the morning if she takes Humalog and not taking Humalog if she takes Lantus. Reviewed difference in Lantus and Humalog insulin. Patient and daughter verbalized back the difference in insulins, when to check CBGs, and when to take her insulins. Patient plans to attend cardiac rehab classes post discharge and plans to schedule PCP visit. Patient shared she has not been to her PCP due to her car broken down and hoping to get transportation.  Would recommend keeping Lantus once daily on discharge to keep regimen simple. Patient has glucometer and strips @ home.  Spoke with pt about A1C 13 (07/09/17) results with them and explained what an A1C is, basic pathophysiology of DM Type 2, basic home care, basic diabetes diet nutrition principles, importance of checking CBGs and maintaining good CBG control to prevent long-term and short-term complications. Reviewed signs and symptoms of hyperglycemia and hypoglycemia and how to treat hypoglycemia at home. Also reviewed blood sugar goals at home.  RNs to provide ongoing basic DM education at bedside with this patient. Have ordered educational booklet, insulin starter kit, and DM videos.   Thank you, Nani Gasser. Dajia Gunnels, RN, MSN, CDE  Diabetes Coordinator Inpatient Glycemic Control Team Team Pager [phone] (8am-5pm) 09/08/2017 1:45 PM

## 2017-09-08 NOTE — Progress Notes (Signed)
Hypoglycemic Event  CBG: 57 (6:20)  Treatment: 15 GM carbohydrate snack  Symptoms: None  Follow-up CBG: Time:06:38 CBG Result:96  Possible Reasons for Event: Medication Regimen sensitive

## 2017-09-13 ENCOUNTER — Telehealth (HOSPITAL_COMMUNITY): Payer: Self-pay

## 2017-09-13 NOTE — Telephone Encounter (Signed)
Patients insurance is active and benefits verified through [place] - No co-pay, deductible amount of $200.00/$200.00 has been met, out of pocket amount of $600.00/$600.00 has been met, 30% co-insurance, and no pre-authorization is required. Passport/referenc [phone]  Patient will be contacted and scheduled after review by the RN Navigator.

## 2017-09-22 ENCOUNTER — Encounter: Payer: Self-pay | Admitting: Family Medicine

## 2017-09-22 ENCOUNTER — Ambulatory Visit (INDEPENDENT_AMBULATORY_CARE_PROVIDER_SITE_OTHER): Payer: BLUE CROSS/BLUE SHIELD | Admitting: Family Medicine

## 2017-09-22 VITALS — BP 134/80 | HR 78 | Temp 98.7°F | Resp 16 | Ht 66.0 in | Wt 182.6 lb

## 2017-09-22 DIAGNOSIS — D509 Iron deficiency anemia, unspecified: Secondary | ICD-10-CM | POA: Diagnosis not present

## 2017-09-22 DIAGNOSIS — E0865 Diabetes mellitus due to underlying condition with hyperglycemia: Secondary | ICD-10-CM

## 2017-09-22 DIAGNOSIS — I10 Essential (primary) hypertension: Secondary | ICD-10-CM

## 2017-09-22 DIAGNOSIS — R7989 Other specified abnormal findings of blood chemistry: Secondary | ICD-10-CM

## 2017-09-22 LAB — POCT URINALYSIS DIP (DEVICE)
BILIRUBIN URINE: NEGATIVE
Glucose, UA: 500 mg/dL — AB
Ketones, ur: NEGATIVE mg/dL
LEUKOCYTES UA: NEGATIVE
NITRITE: NEGATIVE
Protein, ur: >=300 mg/dL — AB
Specific Gravity, Urine: >=1.03 (ref 1.005–1.030)
Urobilinogen, UA: 0.2 mg/dL (ref 0.0–1.0)
pH: 6 (ref 5.0–8.0)

## 2017-09-22 LAB — POCT GLYCOSYLATED HEMOGLOBIN (HGB A1C): Hemoglobin A1C: 9.4

## 2017-09-22 MED ORDER — INSULIN GLARGINE 100 UNIT/ML ~~LOC~~ SOLN
20.0000 [IU] | Freq: Every day | SUBCUTANEOUS | 1 refills | Status: DC
Start: 2017-09-22 — End: 2017-12-15

## 2017-09-22 MED ORDER — CLONIDINE HCL 0.2 MG PO TABS
0.2000 mg | ORAL_TABLET | Freq: Once | ORAL | Status: AC
  Administered 2017-09-22: 0.2 mg via ORAL

## 2017-09-22 MED ORDER — "INSULIN SYRINGE 29G X 1/2"" 0.5 ML MISC": 5 refills | Status: DC

## 2017-09-22 MED ORDER — INSULIN PEN NEEDLE 29G X 10MM MISC: 3 refills | Status: DC

## 2017-09-22 MED ORDER — HYDRALAZINE HCL 50 MG PO TABS: 50.0000 mg | ORAL_TABLET | Freq: Three times a day (TID) | ORAL | 1 refills | Status: DC

## 2017-09-22 MED ORDER — INSULIN LISPRO 100 UNIT/ML ~~LOC~~ SOLN: 5.0000 [IU] | Freq: Three times a day (TID) | SUBCUTANEOUS | 3 refills | Status: DC

## 2017-09-22 MED FILL — ?HUMALOG 100 UNITS/ML VIAL: 100 | 28 days supply | Qty: 10 | Fill #0

## 2017-09-22 MED FILL — hydrALAZINE HCL 50 MG TABS: 50 | 30 days supply | Qty: 90 | Fill #0

## 2017-09-22 MED FILL — !LANTUS 100 UNITS/ML VIAL: 100 | 28 days supply | Qty: 10 | Fill #0

## 2017-09-22 NOTE — Progress Notes (Signed)
Patient ID: Amber Young, female    DOB: [birth year], 62 y.o.   MRN: [id number]  PCP: Scot Jun, FNP  Chief Complaint  Patient presents with  . Hospitalization Follow-up    Subjective:  HPICarolyn Young is a 62 y.o. female presents for evaluation of chronic conditions management and hospital follow-up. Medical problems:CVA (2016)  Acute Combined Systolic and Diastolic CHF class 1, ischemic Cardiomyopathy requiring stent placement 05/2016, Atrial Fibrillation a/p cardiac stent placement (05/2016), Uncontrolled Diabetes, and Uncontrolled Hypertension. Amber Young was hospitalized 09/07/2017 for exertional chest pain and underwent a left heart cath with drug eluding stents to ostial and proximal RCA. EF 40-45%. Post heart cath, Amber Young has remained negative of chest pain since procedure. Amber Young now has Amber Young which has helped with compliance with medication. Reports more consistent monitoring of blood sugar with morning readings 110-115. Reports one episode of hypoglycemia with a reading of 38. Last A1C 13.0. Continues to eat foods regardless of sodium content. She is not monitoring blood pressure at home and reports not taking BP medications prior to appointment today. Denies BLE edema, shortness of breath, or dizziness. Social History   Socioeconomic History  . Marital status: Married    Spouse name: Not on file  . Number of children: Not on file  . Years of education: 70  . Highest education level: Not on file  Social Needs  . Financial resource strain: Not on file  . Food insecurity - worry: Not on file  . Food insecurity - inability: Not on file  . Transportation needs - medical: Not on file  . Transportation needs - non-medical: Not on file  Occupational History  . Occupation: hairdresser  Tobacco Use  . Smoking status: Never Smoker  . Smokeless tobacco: Never Used  Substance and Sexual Activity  . Alcohol use: No    Alcohol/week: 0.0 oz  . Drug use: No  .  Sexual activity: Yes  Other Topics Concern  . Not on file  Social History Narrative   Married, 2 children, hairdresser   Caffeine use- occasional tea or coffee   Right handed    Family History  Problem Relation Age of Onset  . Diabetes Mother   . Hypertension Mother   . COPD Mother   . Heart failure Mother   . Hypertension Sister   . Hypertension Brother    Review of Systems Constitutional: Positive for fatigue.  HENT: Negative.   Respiratory: Negative.   Cardiovascular: Negative.   Gastrointestinal: Negative.   Genitourinary: Negative.   Neurological: Negative.   Hematological: Negative.   Psychiatric/Behavioral: Negative.   Patient Active Problem List   Diagnosis Date Noted  . CKD (chronic kidney disease), stage III ([place]) 09/07/2017  . Hypertensive urgency 09/07/2017  . Chest pain at rest 09/07/2017  . Chest pain 09/07/2017  . Essential hypertension   . Unstable angina ([place])   . Coronary artery disease involving native coronary artery of native heart with unstable angina pectoris ([place])   . Chronic anticoagulation 01/06/2017  . Non compliance w medication regimen 01/06/2017  . Breast abscess 12/20/2016  . Encounter for therapeutic drug monitoring 05/29/2016  . CAD S/P PCI mLAD DES (Promus 3.0 x 24 -- 3.25 mm) 05/19/2016  . PAF (paroxysmal atrial fibrillation) ([place]) 05/19/2016  . Cardiomyopathy, ischemic     Class: Diagnosis of  . Microcytic anemia 04/26/2016  . Acute on chronic combined systolic (congestive) and diastolic (congestive) heart failure ([place]) 11/14/2015  . Hypokalemia 11/14/2015  . Hypertensive emergency   .  Uncontrolled hypertension 09/30/2014  . Uncontrolled diabetes mellitus ([place]) 09/30/2014  . History of stroke 09/30/2014    Allergies  Allergen Reactions  . Ace Inhibitors Cough    Prior to Admission medications   Medication Sig Start Date End Date Taking? Authorizing Provider  amLODipine (NORVASC) 10 MG tablet Take 1 tablet (10 mg total)  by mouth daily. 04/26/17  Yes Scot Jun, FNP  aspirin EC 81 MG tablet Take 1 tablet (81 mg total) by mouth daily. 09/08/17 09/08/18 Yes Debbe Odea, MD  atorvastatin (LIPITOR) 40 MG tablet Take 1 tablet (40 mg total) by mouth daily at 6 PM. 04/26/17  Yes Scot Jun, FNP  carvedilol (COREG) 25 MG tablet Take 2 tablets (50 mg total) by mouth 2 (two) times daily. 07/09/17  Yes Scot Jun, FNP  feeding supplement, GLUCERNA SHAKE, (GLUCERNA SHAKE) LIQD Take 237 mLs by mouth 3 (three) times daily between meals. 02/11/17  Yes Hosie Poisson, MD  ferrous sulfate (FERROUSUL) 325 (65 FE) MG tablet Take 1 tablet (325 mg total) by mouth 3 (three) times daily with meals. 05/26/17  Yes Scot Jun, FNP  furosemide (LASIX) 40 MG tablet Take 1 tablet (40 mg total) by mouth daily. 07/09/17 07/04/18 Yes Scot Jun, FNP  Glucose Blood (BLOOD GLUCOSE TEST STRIPS) STRP Use as directed 02/11/17  Yes Hosie Poisson, MD  insulin glargine (LANTUS) 100 UNIT/ML injection Inject 0.3 mLs (30 Units total) into the skin at bedtime. Patient taking differently: Inject 20 Units into the skin at bedtime.  07/09/17  Yes Scot Jun, FNP  insulin lispro (HUMALOG) 100 UNIT/ML injection Inject 0.08 mLs (8 Units total) into the skin 3 (three) times daily with meals. 07/09/17  Yes Scot Jun, FNP  Insulin Pen Needle 29G X 10MM MISC Use as directed 05/24/17  Yes Scot Jun, FNP  INSULIN SYRINGE .5CC/29G 29G X 1/2" 0.5 ML MISC Use to administer insulin three times daily 01/05/17  Yes Scot Jun, FNP  losartan (COZAAR) 50 MG tablet Take 1 tablet (50 mg total) by mouth 2 (two) times daily. 02/23/17  Yes Scot Jun, FNP  nitroGLYCERIN (NITROSTAT) 0.4 MG SL tablet Place 1 tablet (0.4 mg total) under the tongue every 5 (five) minutes x 3 doses as needed for chest pain. 09/08/17  Yes Debbe Odea, MD  potassium chloride SA (K-DUR,KLOR-CON) 10 MEQ tablet Take 1 tablet with each dose  of furosemide, daily. 07/09/17  Yes Scot Jun, FNP  ticagrelor (BRILINTA) 90 MG TABS tablet Take 1 tablet (90 mg total) by mouth 2 (two) times daily. 07/15/17  Yes Tresa Garter, MD  warfarin (COUMADIN) 5 MG tablet Take 1 to 1.5 tablets by mouth daily as directed by coumadin clinic Patient taking differently: Take 5-7.5 mg by mouth See admin instructions. Take 1 tablet on Sunday, Monday, Wednesday and Friday then take 1 and 1/2 tablets on Tuesday, Thursday and Saturday 06/17/17  Yes Herminio Commons, MD    Past Medical, Surgical Family and Social History reviewed and updated.    Objective:   Today's Vitals   09/22/17 0849 09/22/17 0929 09/22/17 1003  BP: (!) 180/90 (!) 174/96 134/80  Pulse: 78    Resp: 16    Temp: 98.7 F (37.1 C)    TempSrc: Oral    SpO2: 99%    Weight: 182 lb 9.6 oz (82.8 kg)    Height: 5\' 6"  (1.676 m)      Wt Readings from Last 3 Encounters:  09/22/17 182 lb 9.6 oz (82.8 kg)  09/08/17 181 lb 14.4 oz (82.5 kg)  07/09/17 170 lb 9.6 oz (77.4 kg)    Physical Exam Constitutional: She is oriented to person, place, and time. She appears well-developed and well-nourished.  HENT:  Head: Normocephalic and atraumatic.  Eyes: Conjunctivae and EOM are normal. Pupils are equal, round, and reactive to light.  Neck: Normal range of motion. Neck supple.  Cardiovascular: Normal rate, regular rhythm, normal heart sounds and intact distal pulses.  Pulmonary/Chest: Effort normal and breath sounds normal.  Abdominal: Soft. Bowel sounds are normal.  Neurological: She is alert and oriented to person, place, and time.  Skin: Skin is warm and dry.  Psychiatric: She has a normal mood and affect. Her behavior is normal. Judgment and thought content normal.     Assessment & Plan:  1. Diabetes mellitus due to underlying condition, uncontrolled, with hyperglycemia ([place]) - (Hb A1C)9.4, improved and stable compared to prior A1C 13.0. Increased frequency monitoring of  blood sugar prior to administering short-acting insulin- BS <125, hold dose. Continue Lantus 20 units at bedtime.  Due for diabetic eye exam will place a referral to ophthalmology.  2. Accelerated hypertension, unstable. Asymptomatic. Give one dose cloNIDine (CATAPRES) tablet 0.2 mg, PO. Resume all medications at home.   3. Iron deficiency anemia, unspecified iron deficiency anemia type,  Checking a CBC with Differential to evaluate hemoglobin status. Continue daily iron as prescribed   4. Elevated serum creatinine- Comprehensive metabolic panel  Orders Placed This Encounter  Procedures  . CBC with Differential  . Comprehensive metabolic panel  . Ambulatory referral to Ophthalmology  . POCT glycosylated hemoglobin (Hb A1C)  . POCT urinalysis dip (device)    RTC: 4 weeks for Hypertension follow-up and patient given instruction to take all medications as prescribed prior appointment   Carroll Sage. [name], MSN, FNP-C [facility]  [address] [name] [place], [place] [id number] [phone]

## 2017-09-22 NOTE — Patient Instructions (Addendum)
Humalog 5 units, 3 times daily before meals. Do not administer short-acting  (humalog) if blood sugar is less than 125.  Long-acting (lantus) 20 units at bedtime only.   Goal blood sugar readings less than 180 and greater than 100.  Continue all other medications as prescribed. To improve blood pressure control, adding Hydalazine 50 mg, 3 times daily.  Make certain take medication prior to cardiology visit, to ensure an accurate blood pressure reading.         Hypertension Hypertension is another name for high blood pressure. High blood pressure forces your heart to work harder to pump blood. This can cause problems over time. There are two numbers in a blood pressure reading. There is a top number (systolic) over a bottom number (diastolic). It is best to have a blood pressure below 120/80. Healthy choices can help lower your blood pressure. You may need medicine to help lower your blood pressure if:  Your blood pressure cannot be lowered with healthy choices.  Your blood pressure is higher than 130/80.  Follow these instructions at home: Eating and drinking  If directed, follow the DASH eating plan. This diet includes: ? Filling half of your plate at each meal with fruits and vegetables. ? Filling one quarter of your plate at each meal with whole grains. Whole grains include whole wheat pasta, brown rice, and whole grain bread. ? Eating or drinking low-fat dairy products, such as skim milk or low-fat yogurt. ? Filling one quarter of your plate at each meal with low-fat (lean) proteins. Low-fat proteins include fish, skinless chicken, eggs, beans, and tofu. ? Avoiding fatty meat, cured and processed meat, or chicken with skin. ? Avoiding premade or processed food.  Eat less than 1,500 mg of salt (sodium) a day.  Limit alcohol use to no more than 1 drink a day for nonpregnant women and 2 drinks a day for men. One drink equals 12 oz of beer, 5 oz of wine, or 1 oz of hard  liquor. Lifestyle  Work with your doctor to stay at a healthy weight or to lose weight. Ask your doctor what the best weight is for you.  Get at least 30 minutes of exercise that causes your heart to beat faster (aerobic exercise) most days of the week. This may include walking, swimming, or biking.  Get at least 30 minutes of exercise that strengthens your muscles (resistance exercise) at least 3 days a week. This may include lifting weights or pilates.  Do not use any products that contain nicotine or tobacco. This includes cigarettes and e-cigarettes. If you need help quitting, ask your doctor.  Check your blood pressure at home as told by your doctor.  Keep all follow-up visits as told by your doctor. This is important. Medicines  Take over-the-counter and prescription medicines only as told by your doctor. Follow directions carefully.  Do not skip doses of blood pressure medicine. The medicine does not work as well if you skip doses. Skipping doses also puts you at risk for problems.  Ask your doctor about side effects or reactions to medicines that you should watch for. Contact a doctor if:  You think you are having a reaction to the medicine you are taking.  You have headaches that keep coming back (recurring).  You feel dizzy.  You have swelling in your ankles.  You have trouble with your vision. Get help right away if:  You get a very bad headache.  You start to feel confused.  You feel weak or numb.  You feel faint.  You get very bad pain in your: ? Chest. ? Belly (abdomen).  You throw up (vomit) more than once.  You have trouble breathing. Summary  Hypertension is another name for high blood pressure.  Making healthy choices can help lower blood pressure. If your blood pressure cannot be controlled with healthy choices, you may need to take medicine. This information is not intended to replace advice given to you by your health care provider. Make sure  you discuss any questions you have with your health care provider. Document Released: 01/13/2008 Document Revised: 06/24/2016 Document Reviewed: 06/24/2016 Elsevier Interactive Patient Education  2018 [employer].   Diabetes Mellitus and Nutrition When you have diabetes (diabetes mellitus), it is very important to have healthy eating habits because your blood sugar (glucose) levels are greatly affected by what you eat and drink. Eating healthy foods in the appropriate amounts, at about the same times every day, can help you:  Control your blood glucose.  Lower your risk of heart disease.  Improve your blood pressure.  Reach or maintain a healthy weight.  Every person with diabetes is different, and each person has different needs for a meal plan. Your health care provider may recommend that you work with a diet and nutrition specialist (dietitian) to make a meal plan that is best for you. Your meal plan may vary depending on factors such as:  The calories you need.  The medicines you take.  Your weight.  Your blood glucose, blood pressure, and cholesterol levels.  Your activity level.  Other health conditions you have, such as heart or kidney disease.  How do carbohydrates affect me? Carbohydrates affect your blood glucose level more than any other type of food. Eating carbohydrates naturally increases the amount of glucose in your blood. Carbohydrate counting is a method for keeping track of how many carbohydrates you eat. Counting carbohydrates is important to keep your blood glucose at a healthy level, especially if you use insulin or take certain oral diabetes medicines. It is important to know how many carbohydrates you can safely have in each meal. This is different for every person. Your dietitian can help you calculate how many carbohydrates you should have at each meal and for snack. Foods that contain carbohydrates include:  Bread, cereal, rice, pasta, and  crackers.  Potatoes and corn.  Peas, beans, and lentils.  Milk and yogurt.  Fruit and juice.  Desserts, such as cakes, cookies, ice cream, and candy.  How does alcohol affect me? Alcohol can cause a sudden decrease in blood glucose (hypoglycemia), especially if you use insulin or take certain oral diabetes medicines. Hypoglycemia can be a life-threatening condition. Symptoms of hypoglycemia (sleepiness, dizziness, and confusion) are similar to symptoms of having too much alcohol. If your health care provider says that alcohol is safe for you, follow these guidelines:  Limit alcohol intake to no more than 1 drink per day for nonpregnant women and 2 drinks per day for men. One drink equals 12 oz of beer, 5 oz of wine, or 1 oz of hard liquor.  Do not drink on an empty stomach.  Keep yourself hydrated with water, diet soda, or unsweetened iced tea.  Keep in mind that regular soda, juice, and other mixers may contain a lot of sugar and must be counted as carbohydrates.  What are tips for following this plan? Reading food labels  Start by checking the serving size on the label. The  amount of calories, carbohydrates, fats, and other nutrients listed on the label are based on one serving of the food. Many foods contain more than one serving per package.  Check the total grams (g) of carbohydrates in one serving. You can calculate the number of servings of carbohydrates in one serving by dividing the total carbohydrates by 15. For example, if a food has 30 g of total carbohydrates, it would be equal to 2 servings of carbohydrates.  Check the number of grams (g) of saturated and trans fats in one serving. Choose foods that have low or no amount of these fats.  Check the number of milligrams (mg) of sodium in one serving. Most people should limit total sodium intake to less than 2,300 mg per day.  Always check the nutrition information of foods labeled as "low-fat" or "nonfat". These foods  may be higher in added sugar or refined carbohydrates and should be avoided.  Talk to your dietitian to identify your daily goals for nutrients listed on the label. Shopping  Avoid buying canned, premade, or processed foods. These foods tend to be high in fat, sodium, and added sugar.  Shop around the outside edge of the grocery store. This includes fresh fruits and vegetables, bulk grains, fresh meats, and fresh dairy. Cooking  Use low-heat cooking methods, such as baking, instead of high-heat cooking methods like deep frying.  Cook using healthy oils, such as olive, canola, or sunflower oil.  Avoid cooking with butter, cream, or high-fat meats. Meal planning  Eat meals and snacks regularly, preferably at the same times every day. Avoid going long periods of time without eating.  Eat foods high in fiber, such as fresh fruits, vegetables, beans, and whole grains. Talk to your dietitian about how many servings of carbohydrates you can eat at each meal.  Eat 4-6 ounces of lean protein each day, such as lean meat, chicken, fish, eggs, or tofu. 1 ounce is equal to 1 ounce of meat, chicken, or fish, 1 egg, or 1/4 cup of tofu.  Eat some foods each day that contain healthy fats, such as avocado, nuts, seeds, and fish. Lifestyle   Check your blood glucose regularly.  Exercise at least 30 minutes 5 or more days each week, or as told by your health care provider.  Take medicines as told by your health care provider.  Do not use any products that contain nicotine or tobacco, such as cigarettes and e-cigarettes. If you need help quitting, ask your health care provider.  Work with a Social worker or diabetes educator to identify strategies to manage stress and any emotional and social challenges. What are some questions to ask my health care provider?  Do I need to meet with a diabetes educator?  Do I need to meet with a dietitian?  What number can I call if I have questions?  When are the  best times to check my blood glucose? Where to find more information:  American Diabetes Association: diabetes.org/food-and-fitness/food  Academy of Nutrition and Dietetics: PokerClues.dk  [employer] of Diabetes and Digestive and Kidney Diseases (NIH): ContactWire.be Summary  A healthy meal plan will help you control your blood glucose and maintain a healthy lifestyle.  Working with a diet and nutrition specialist (dietitian) can help you make a meal plan that is best for you.  Keep in mind that carbohydrates and alcohol have immediate effects on your blood glucose levels. It is important to count carbohydrates and to use alcohol carefully. This information is not  intended to replace advice given to you by your health care provider. Make sure you discuss any questions you have with your health care provider. Document Released: 04/23/2005 Document Revised: 08/31/2016 Document Reviewed: 08/31/2016 Elsevier Interactive Patient Education  [employer].

## 2017-09-23 LAB — CBC WITH DIFFERENTIAL/PLATELET
BASOS ABS: 0 10*3/uL (ref 0.0–0.2)
Basos: 0 %
EOS (ABSOLUTE): 0.1 10*3/uL (ref 0.0–0.4)
Eos: 2 %
HEMOGLOBIN: 9.4 g/dL — AB (ref 11.1–15.9)
Hematocrit: 30.1 % — ABNORMAL LOW (ref 34.0–46.6)
Immature Grans (Abs): 0 10*3/uL (ref 0.0–0.1)
Immature Granulocytes: 0 %
LYMPHS ABS: 1.7 10*3/uL (ref 0.7–3.1)
Lymphs: 32 %
MCH: 24.3 pg — ABNORMAL LOW (ref 26.6–33.0)
MCHC: 31.2 g/dL — AB (ref 31.5–35.7)
MCV: 78 fL — ABNORMAL LOW (ref 79–97)
MONOCYTES: 12 %
Monocytes Absolute: 0.6 10*3/uL (ref 0.1–0.9)
Neutrophils Absolute: 3 10*3/uL (ref 1.4–7.0)
Neutrophils: 54 %
PLATELETS: 377 10*3/uL (ref 150–379)
RBC: 3.87 x10E6/uL (ref 3.77–5.28)
RDW: 14.9 % (ref 12.3–15.4)
WBC: 5.4 10*3/uL (ref 3.4–10.8)

## 2017-09-23 LAB — COMPREHENSIVE METABOLIC PANEL
ALBUMIN: 3.3 g/dL — AB (ref 3.6–4.8)
ALK PHOS: 145 IU/L — AB (ref 39–117)
ALT: 12 IU/L (ref 0–32)
AST: 15 IU/L (ref 0–40)
Albumin/Globulin Ratio: 1 — ABNORMAL LOW (ref 1.2–2.2)
BUN/Creatinine Ratio: 15 (ref 12–28)
BUN: 17 mg/dL (ref 8–27)
Bilirubin Total: <0.2 mg/dL (ref 0.0–1.2)
CO2: 22 mmol/L (ref 20–29)
Calcium: 9 mg/dL (ref 8.7–10.3)
Chloride: 105 mmol/L (ref 96–106)
Creatinine, Ser: 1.12 mg/dL — ABNORMAL HIGH (ref 0.57–1.00)
GFR calc Af Amer: 61 mL/min/{1.73_m2} (ref >59)
GFR calc non Af Amer: 53 mL/min/{1.73_m2} — ABNORMAL LOW (ref >59)
GLUCOSE: 210 mg/dL — AB (ref 65–99)
Globulin, Total: 3.2 g/dL (ref 1.5–4.5)
Potassium: 4 mmol/L (ref 3.5–5.2)
Sodium: 138 mmol/L (ref 134–144)
Total Protein: 6.5 g/dL (ref 6.0–8.5)

## 2017-09-24 ENCOUNTER — Encounter: Payer: Self-pay | Admitting: Cardiovascular Disease

## 2017-09-24 ENCOUNTER — Ambulatory Visit (INDEPENDENT_AMBULATORY_CARE_PROVIDER_SITE_OTHER): Payer: BLUE CROSS/BLUE SHIELD | Admitting: Cardiovascular Disease

## 2017-09-24 VITALS — BP 142/72 | HR 68 | Ht 66.0 in | Wt 181.0 lb

## 2017-09-24 DIAGNOSIS — I6522 Occlusion and stenosis of left carotid artery: Secondary | ICD-10-CM | POA: Diagnosis not present

## 2017-09-24 DIAGNOSIS — I5042 Chronic combined systolic (congestive) and diastolic (congestive) heart failure: Secondary | ICD-10-CM

## 2017-09-24 DIAGNOSIS — Z8673 Personal history of transient ischemic attack (TIA), and cerebral infarction without residual deficits: Secondary | ICD-10-CM

## 2017-09-24 DIAGNOSIS — Z955 Presence of coronary angioplasty implant and graft: Secondary | ICD-10-CM | POA: Diagnosis not present

## 2017-09-24 DIAGNOSIS — Z7901 Long term (current) use of anticoagulants: Secondary | ICD-10-CM | POA: Diagnosis not present

## 2017-09-24 DIAGNOSIS — I25118 Atherosclerotic heart disease of native coronary artery with other forms of angina pectoris: Secondary | ICD-10-CM | POA: Diagnosis not present

## 2017-09-24 DIAGNOSIS — I480 Paroxysmal atrial fibrillation: Secondary | ICD-10-CM

## 2017-09-24 MED ORDER — ASPIRIN EC 81 MG PO TBEC: DELAYED_RELEASE_TABLET | ORAL | 2 refills | Status: DC

## 2017-09-24 NOTE — Patient Instructions (Signed)
Your physician recommends that you schedule a follow-up appointment in: 3 months with Dr.Koneswaran   On March 1 st STOP Aspirin    All other medications stay the same      No tests ordered today      Thank you for choosing [facility] !

## 2017-09-24 NOTE — Progress Notes (Signed)
SUBJECTIVE: Patient presents for posthospitalization follow-up.  She recently underwent percutaneous coronary intervention with 2 drug-eluting stents placed to the proximal RCA on 09/07/17.  The plan was for triple therapy with aspirin, Brilinta, and warfarin for 1 month and then stop aspirin. There is a 40% proximal LAD stenosis and a previously placed mid LAD stent that was patent.  LVEDP was mildly elevated.  Echocardiogram 09/08/17 showed mild to moderately function, LVEF 40-45%, severe concentric LVH, multiple wall motion abnormalities, grade 3 diastolic dysfunction with elevated filling pressures, mild mitral regurgitation, pulmonary pressures 43 mmHg.  She is doing well.  She moved from [place] to [place] in early 2018 but prefers to continue following up with me in [place].  She tells me her primary symptoms prior to PCI were orthopnea.  She said the symptoms have essentially resolved.  She denies chest pain, palpitations, and shortness of breath.  She has some mild right ankle swelling.  She plans to participate in cardiac debilitation in [place].  She was instructed to walk inside of her house if the temperatures were too cold outside.  She has begun doing so.   Review of Systems: As per "subjective", otherwise negative.  Allergies  Allergen Reactions  . Ace Inhibitors Cough    Current Outpatient Medications  Medication Sig Dispense Refill  . amLODipine (NORVASC) 10 MG tablet Take 1 tablet (10 mg total) by mouth daily. 90 tablet 2  . aspirin EC 81 MG tablet Take 1 tablet (81 mg total) by mouth daily. 150 tablet 2  . atorvastatin (LIPITOR) 40 MG tablet Take 1 tablet (40 mg total) by mouth daily at 6 PM. 90 tablet 1  . carvedilol (COREG) 25 MG tablet Take 2 tablets (50 mg total) by mouth 2 (two) times daily. 60 tablet 3  . feeding supplement, GLUCERNA SHAKE, (GLUCERNA SHAKE) LIQD Take 237 mLs by mouth 3 (three) times daily between meals.  0  . ferrous sulfate  (FERROUSUL) 325 (65 FE) MG tablet Take 1 tablet (325 mg total) by mouth 3 (three) times daily with meals. 90 tablet 3  . furosemide (LASIX) 40 MG tablet Take 1 tablet (40 mg total) by mouth daily. 90 tablet 3  . Glucose Blood (BLOOD GLUCOSE TEST STRIPS) STRP Use as directed 200 each 0  . hydrALAZINE (APRESOLINE) 50 MG tablet Take 1 tablet (50 mg total) by mouth 3 (three) times daily. 90 tablet 1  . insulin glargine (LANTUS) 100 UNIT/ML injection Inject 0.2 mLs (20 Units total) into the skin at bedtime. 20 mL 1  . insulin lispro (HUMALOG) 100 UNIT/ML injection Inject 0.05 mLs (5 Units total) into the skin 3 (three) times daily with meals. 30 mL 3  . Insulin Pen Needle 29G X 10MM MISC Use as directed 100 each 3  . INSULIN SYRINGE .5CC/29G 29G X 1/2" 0.5 ML MISC Use to administer insulin three times daily 200 each 5  . losartan (COZAAR) 50 MG tablet Take 1 tablet (50 mg total) by mouth 2 (two) times daily. 90 tablet 3  . nitroGLYCERIN (NITROSTAT) 0.4 MG SL tablet Place 1 tablet (0.4 mg total) under the tongue every 5 (five) minutes x 3 doses as needed for chest pain. 30 tablet 12  . potassium chloride SA (K-DUR,KLOR-CON) 10 MEQ tablet Take 1 tablet with each dose of furosemide, daily. 30 tablet 2  . ticagrelor (BRILINTA) 90 MG TABS tablet Take 1 tablet (90 mg total) by mouth 2 (two) times daily. 180 tablet 3  .  warfarin (COUMADIN) 5 MG tablet Take 1 to 1.5 tablets by mouth daily as directed by coumadin clinic (Patient taking differently: Take 5-7.5 mg by mouth See admin instructions. Take 1 tablet on Sunday, Monday, Wednesday and Friday then take 1 and 1/2 tablets on Tuesday, Thursday and Saturday) 120 tablet 1   No current facility-administered medications for this visit.     Past Medical History:  Diagnosis Date  . Anemia   . CHF (congestive heart failure) ([place])   . CKD (chronic kidney disease), stage III ([place])   . Coronary artery disease   . GERD (gastroesophageal reflux disease)   . Heart  murmur   . Hyperlipidemia   . Hypertension   . Stroke ([place]) 09/2014   numbness left upper lip, finger tips on left hand; "resolved" (05/18/2016)  . Type II diabetes mellitus ([place])     Past Surgical History:  Procedure Laterality Date  . CARDIAC CATHETERIZATION N/A 05/18/2016   Procedure: Left Heart Cath and Coronary Angiography;  Surgeon: Jettie Booze, MD;  Location: [facility];  Service: Cardiovascular;  Laterality: N/A;  . CARDIAC CATHETERIZATION N/A 05/18/2016   Procedure: Coronary Stent Intervention;  Surgeon: Jettie Booze, MD;  Location: [facility];  Service: Cardiovascular;  Laterality: N/A;  . CORONARY ANGIOPLASTY    . CORONARY ANGIOPLASTY WITH STENT PLACEMENT    . CORONARY STENT INTERVENTION N/A 09/07/2017   Procedure: CORONARY STENT INTERVENTION;  Surgeon: Leonie Man, MD;  Location: [facility];  Service: Cardiovascular;  Laterality: N/A;  . LEFT HEART CATH AND CORONARY ANGIOGRAPHY N/A 09/07/2017   Procedure: LEFT HEART CATH AND CORONARY ANGIOGRAPHY;  Surgeon: Leonie Man, MD;  Location: [facility];  Service: Cardiovascular;  Laterality: N/A;  . TUBAL LIGATION  1980  . VAGINAL HYSTERECTOMY  1999   "partial; fibroids"    Social History   Socioeconomic History  . Marital status: Married    Spouse name: Not on file  . Number of children: Not on file  . Years of education: 54  . Highest education level: Not on file  Social Needs  . Financial resource strain: Not on file  . Food insecurity - worry: Not on file  . Food insecurity - inability: Not on file  . Transportation needs - medical: Not on file  . Transportation needs - non-medical: Not on file  Occupational History  . Occupation: hairdresser  Tobacco Use  . Smoking status: Never Smoker  . Smokeless tobacco: Never Used  Substance and Sexual Activity  . Alcohol use: No    Alcohol/week: 0.0 oz  . Drug use: No  . Sexual activity: Yes  Other Topics Concern  . Not  on file  Social History Narrative   Married, 2 children, hairdresser   Caffeine use- occasional tea or coffee   Right handed     Vitals:   09/24/17 1024  BP: (!) 142/72  Pulse: 68  SpO2: 98%  Weight: 181 lb (82.1 kg)  Height: 5\' 6"  (1.676 m)    Wt Readings from Last 3 Encounters:  09/24/17 181 lb (82.1 kg)  09/22/17 182 lb 9.6 oz (82.8 kg)  09/08/17 181 lb 14.4 oz (82.5 kg)     PHYSICAL EXAM General: NAD HEENT: Normal. Neck: No JVD, no thyromegaly. Lungs: Clear to auscultation bilaterally with normal respiratory effort. CV: Regular rate and rhythm, normal S1/S2, no S3/S4, no murmur. No pretibial or periankle edema.  No carotid bruit.   Abdomen: Soft, nontender,  no distention.  Neurologic: Alert and oriented.  Psych: Normal affect. Skin: Normal. Musculoskeletal: No gross deformities.    ECG: Most recent ECG reviewed.   Labs: Lab Results  Component Value Date/Time   K 4.0 09/22/2017 09:58 AM   BUN 17 09/22/2017 09:58 AM   CREATININE 1.12 (H) 09/22/2017 09:58 AM   CREATININE 1.33 (H) 07/09/2017 10:47 AM   ALT 12 09/22/2017 09:58 AM   TSH 1.34 01/05/2017 11:46 AM   HGB 9.4 (L) 09/22/2017 09:58 AM     Lipids: Lab Results  Component Value Date/Time   LDLCALC 95 01/05/2017 11:46 AM   CHOL 190 01/05/2017 11:46 AM   TRIG 74 01/05/2017 11:46 AM   HDL 80 01/05/2017 11:46 AM       ASSESSMENT AND PLAN: 1. Chronic combined systolic and diastolic heart failure, LVEF 40-45% with grade 3 diastolic dysfunction: Euvolemic and symptomatically stable. She is currently on Lasix 40 mg daily. Continue carvedilol and losartan.  2. Coronary artery disease with LAD stent and recent drug-eluting stent placement x2 to the ostial and proximal RCA: Currently on carvedilol, aspirin, Brilinta, and Lipitor.  At the beginning of March, I will stop aspirin and she will remain on Brilinta she is also on warfarin.  3. Malignant hypertension: Mildly elevated.  I will monitor to see if  further antihypertensive titration is indicated.  I have encouraged her to continue walking inside her house.  I informed her that with continued exercise, blood pressures may also normalize.  4. Carotid artery disease: Carotid Dopplers in 12/2015 showed moderate left-sided plaque disease with no hemodynamically significant stenosis.  Continue antiplatelet and statin therapy.  I will repeat Dopplers at the time of her next visit.  5. Paroxysmal atrial fibrillation and atrial tachycardia: Currently on carvedilol. Anticoagulated with warfarin.  No changes to therapy.  INR monitoring is performed in our [place] office.    Disposition: Follow up 3 months   Kate Sable, M.D., F.A.C.C.

## 2017-10-03 ENCOUNTER — Telehealth: Payer: Self-pay | Admitting: Family Medicine

## 2017-10-03 NOTE — Telephone Encounter (Signed)
Patient's office note complete. Please process referral to ophthalmology for diabetic eye exam.

## 2017-10-04 NOTE — Telephone Encounter (Signed)
Referral was faxed on 10/04/2017.

## 2017-10-05 ENCOUNTER — Encounter: Payer: Self-pay | Admitting: Neurology

## 2017-10-05 ENCOUNTER — Ambulatory Visit: Payer: BLUE CROSS/BLUE SHIELD | Admitting: Neurology

## 2017-10-05 VITALS — BP 124/68 | HR 70 | Ht 66.5 in | Wt 175.0 lb

## 2017-10-05 DIAGNOSIS — M79602 Pain in left arm: Secondary | ICD-10-CM | POA: Diagnosis not present

## 2017-10-05 DIAGNOSIS — R251 Tremor, unspecified: Secondary | ICD-10-CM | POA: Diagnosis not present

## 2017-10-05 DIAGNOSIS — R200 Anesthesia of skin: Secondary | ICD-10-CM

## 2017-10-05 DIAGNOSIS — R202 Paresthesia of skin: Secondary | ICD-10-CM | POA: Diagnosis not present

## 2017-10-05 DIAGNOSIS — I69354 Hemiplegia and hemiparesis following cerebral infarction affecting left non-dominant side: Secondary | ICD-10-CM

## 2017-10-05 NOTE — Progress Notes (Signed)
NEUROLOGY CONSULTATION NOTE  Amber Young MRN: [id number] DOB: [birth year]  Referring provider: Molli Barrows, FNP Primary care provider: Molli Barrows, FNP  Reason for consult:  Left arm pain/numbness, history of stroke  HISTORY OF PRESENT ILLNESS: Amber Young is a 62 year old right-handed female with hypertension, paroxysmal atrial fibrillation, hyperlipidemia, type 2 diabetes, CAD and history of stroke who presents for left arm pain  History supplemented by hospital notes.  Imaging of left humerus X-ray and CT of cervical spine from 12/20/16 and MRI of brain from 09/30/14 personally reviewed.  She had an acute lacunar infarct of the right basal ganglia in February 2016, presenting with left sided weakness and numbness.  She reports some mild residual left sided weakness of the upper and lower extremities.  In May 2018, she had a mechanical fall on her left side, causing left arm pain.  She was found to have a proximal humeral fracture on left humerus X-ray, requiring conservative management only.  CT of cervical spine was unremarkable.  At the time, she was also found to have sebaceous cyst of the breast with cellulitis.  Since then, she continues to have localized left upper arm pain.  She also has intermittent numbness and tingling of all fingers in her left hand.  It is most noticeable when she had been laying on her left side in bed.  It resolves quickly when she shakes out her hand.  Sometimes her left upper extremity feels weak.  She has some mild left leg weakness since the stroke.  She denies neck pain.  When she is stressed, she notes shaking of the left upper or lower extremity.  She usually needs to hold onto her hand and it resolves.  PAST MEDICAL HISTORY: Past Medical History:  Diagnosis Date  . Anemia   . CHF (congestive heart failure) ([place])   . CKD (chronic kidney disease), stage III ([place])   . Coronary artery disease   . GERD (gastroesophageal reflux disease)     . Heart murmur   . Hyperlipidemia   . Hypertension   . Stroke ([place]) 09/2014   numbness left upper lip, finger tips on left hand; "resolved" (05/18/2016)  . Type II diabetes mellitus ([place])     PAST SURGICAL HISTORY: Past Surgical History:  Procedure Laterality Date  . CARDIAC CATHETERIZATION N/A 05/18/2016   Procedure: Left Heart Cath and Coronary Angiography;  Surgeon: Jettie Booze, MD;  Location: [facility];  Service: Cardiovascular;  Laterality: N/A;  . CARDIAC CATHETERIZATION N/A 05/18/2016   Procedure: Coronary Stent Intervention;  Surgeon: Jettie Booze, MD;  Location: [facility];  Service: Cardiovascular;  Laterality: N/A;  . CORONARY ANGIOPLASTY    . CORONARY ANGIOPLASTY WITH STENT PLACEMENT    . CORONARY STENT INTERVENTION N/A 09/07/2017   Procedure: CORONARY STENT INTERVENTION;  Surgeon: Leonie Man, MD;  Location: [facility];  Service: Cardiovascular;  Laterality: N/A;  . LEFT HEART CATH AND CORONARY ANGIOGRAPHY N/A 09/07/2017   Procedure: LEFT HEART CATH AND CORONARY ANGIOGRAPHY;  Surgeon: Leonie Man, MD;  Location: [facility];  Service: Cardiovascular;  Laterality: N/A;  . TUBAL LIGATION  1980  . VAGINAL HYSTERECTOMY  1999   "partial; fibroids"    MEDICATIONS: Current Outpatient Medications on File Prior to Visit  Medication Sig Dispense Refill  . amLODipine (NORVASC) 10 MG tablet Take 1 tablet (10 mg total) by mouth daily. 90 tablet 2  . aspirin EC 81 MG tablet On  10/08/17  STOP Aspirin 150 tablet 2  . atorvastatin (LIPITOR) 40 MG tablet Take 1 tablet (40 mg total) by mouth daily at 6 PM. 90 tablet 1  . carvedilol (COREG) 25 MG tablet Take 2 tablets (50 mg total) by mouth 2 (two) times daily. 60 tablet 3  . ferrous sulfate (FERROUSUL) 325 (65 FE) MG tablet Take 1 tablet (325 mg total) by mouth 3 (three) times daily with meals. 90 tablet 3  . furosemide (LASIX) 40 MG tablet Take 1 tablet (40 mg total) by mouth daily. 90  tablet 3  . Glucose Blood (BLOOD GLUCOSE TEST STRIPS) STRP Use as directed 200 each 0  . hydrALAZINE (APRESOLINE) 50 MG tablet Take 1 tablet (50 mg total) by mouth 3 (three) times daily. 90 tablet 1  . insulin glargine (LANTUS) 100 UNIT/ML injection Inject 0.2 mLs (20 Units total) into the skin at bedtime. 20 mL 1  . insulin lispro (HUMALOG) 100 UNIT/ML injection Inject 0.05 mLs (5 Units total) into the skin 3 (three) times daily with meals. 30 mL 3  . Insulin Pen Needle 29G X 10MM MISC Use as directed 100 each 3  . INSULIN SYRINGE .5CC/29G 29G X 1/2" 0.5 ML MISC Use to administer insulin three times daily 200 each 5  . losartan (COZAAR) 50 MG tablet Take 1 tablet (50 mg total) by mouth 2 (two) times daily. 90 tablet 3  . nitroGLYCERIN (NITROSTAT) 0.4 MG SL tablet Place 1 tablet (0.4 mg total) under the tongue every 5 (five) minutes x 3 doses as needed for chest pain. 30 tablet 12  . potassium chloride SA (K-DUR,KLOR-CON) 10 MEQ tablet Take 1 tablet with each dose of furosemide, daily. 30 tablet 2  . ticagrelor (BRILINTA) 90 MG TABS tablet Take 1 tablet (90 mg total) by mouth 2 (two) times daily. 180 tablet 3  . warfarin (COUMADIN) 5 MG tablet Take 1 to 1.5 tablets by mouth daily as directed by coumadin clinic (Patient taking differently: Take 5-7.5 mg by mouth See admin instructions. Take 1 tablet on Sunday, Monday, Wednesday and Friday then take 1 and 1/2 tablets on Tuesday, Thursday and Saturday) 120 tablet 1   No current facility-administered medications on file prior to visit.     ALLERGIES: Allergies  Allergen Reactions  . Ace Inhibitors Cough    FAMILY HISTORY: Family History  Problem Relation Age of Onset  . Diabetes Mother   . Hypertension Mother   . COPD Mother   . Heart failure Mother   . Hypertension Sister   . Hypertension Brother     SOCIAL HISTORY: Social History   Socioeconomic History  . Marital status: Married    Spouse name: Not on file  . Number of children:  Not on file  . Years of education: 4  . Highest education level: Not on file  Social Needs  . Financial resource strain: Not on file  . Food insecurity - worry: Not on file  . Food insecurity - inability: Not on file  . Transportation needs - medical: Not on file  . Transportation needs - non-medical: Not on file  Occupational History  . Occupation: hairdresser  Tobacco Use  . Smoking status: Never Smoker  . Smokeless tobacco: Never Used  Substance and Sexual Activity  . Alcohol use: No    Alcohol/week: 0.0 oz  . Drug use: No  . Sexual activity: Yes  Other Topics Concern  . Not on file  Social History Narrative   Married, 2 children,  hairdresser   Caffeine use- occasional tea or coffee   Right handed    REVIEW OF SYSTEMS: Constitutional: No fevers, chills, or sweats, no generalized fatigue, change in appetite Eyes: No visual changes, double vision, eye pain Ear, nose and throat: No hearing loss, ear pain, nasal congestion, sore throat Cardiovascular: No chest pain, palpitations Respiratory:  No shortness of breath at rest or with exertion, wheezes GastrointestinaI: No nausea, vomiting, diarrhea, abdominal pain, fecal incontinence Genitourinary:  No dysuria, urinary retention or frequency Musculoskeletal:  Left upper arm pain Integumentary: No rash, pruritus, skin lesions Neurological: as above Psychiatric: No depression, insomnia, anxiety Endocrine: No palpitations, fatigue, diaphoresis, mood swings, change in appetite, change in weight, increased thirst Hematologic/Lymphatic:  No purpura, petechiae. Allergic/Immunologic: no itchy/runny eyes, nasal congestion, recent allergic reactions, rashes  PHYSICAL EXAM: Vitals:   10/05/17 0904  BP: 124/68  Pulse: 70  SpO2: 98%   General: No acute distress.  Patient appears well-groomed.  Head:  Normocephalic/atraumatic Eyes:  fundi examined but not visualized Neck: supple, no paraspinal tenderness, full range of  motion Back: No paraspinal tenderness Heart: regular rate and rhythm Lungs: Clear to auscultation bilaterally. Vascular: No carotid bruits. Neurological Exam: Mental status: alert and oriented to person, place, and time, recent and remote memory intact, fund of knowledge intact, attention and concentration intact, speech fluent and not dysarthric, language intact. Cranial nerves: CN I: not tested CN II: pupils equal, round and reactive to light, visual fields intact CN III, IV, VI:  full range of motion, no nystagmus, no ptosis CN V: facial sensation intact CN VII: upper and lower face symmetric CN VIII: hearing intact CN IX, X: gag intact, uvula midline CN XI: sternocleidomastoid and trapezius muscles intact CN XII: tongue midline Bulk & Tone: normal, no fasciculations. Motor:  4+/5 left deltoid, 5-/5 left hand grip, 4+/5 left hip flexion.  Otherwise, 5/5. Sensation:  Pinprick and vibration sensation intact. Deep Tendon Reflexes:  2+ throughout, toes downgoing.  Finger to nose testing:  Without dysmetria.  Heel to shin:  Without dysmetria.  Gait:  Normal station and stride.  Able to turn. Romberg negative.  IMPRESSION: 1.  Left upper arm pain, likely residual from fracture.  She does not exhibit any clear radicular pain.  She reports intermittent numbness in the fingers when she lays on her left side, which could be underlying carpal tunnel syndrome or just positional paresthesia.  She exhibits some mild weakness in the left deltoid which may be limited due to pain.  She also has probable residual mild weakness from her stroke. 2.  Intermittent left sided shakingr.  Probably related to stress as it is stress-induced.  Not likely physiologic.  Not likely a symptomatic seizure disorder as a lacunar infarct of the basal ganglia not likely to cause seizure.  PLAN: 1.  We will check NCV-EMG of left upper extremity. 2.  Advised to try wearing wrist splint on left wrist at night. 3.  We  will check EEG 4.  Further recommendations pending results.  Ultimately, recommend referral to a pain specialist if needed.  Thank you for allowing me to take part in the care of this patient.  Metta Clines, DO  CC: Molli Barrows, FNP

## 2017-10-05 NOTE — Patient Instructions (Signed)
1.  We will check a nerve test of the left arm to look for evidence of nerve damage. 2.  In meantime, you can buy a wrist splint at [facility] or [facility] to wear on your left wrist at night time and see if you no longer get numbness in the left hand. 3.  We will also check EEG to evaluate brain waves for any cause of left sided shaking.

## 2017-10-06 ENCOUNTER — Ambulatory Visit (INDEPENDENT_AMBULATORY_CARE_PROVIDER_SITE_OTHER): Payer: BLUE CROSS/BLUE SHIELD | Admitting: Pharmacist

## 2017-10-06 ENCOUNTER — Telehealth: Payer: Self-pay | Admitting: Family Medicine

## 2017-10-06 ENCOUNTER — Telehealth (HOSPITAL_COMMUNITY): Payer: Self-pay

## 2017-10-06 DIAGNOSIS — I480 Paroxysmal atrial fibrillation: Secondary | ICD-10-CM

## 2017-10-06 DIAGNOSIS — Z5181 Encounter for therapeutic drug level monitoring: Secondary | ICD-10-CM | POA: Diagnosis not present

## 2017-10-06 LAB — POCT INR: INR: 1

## 2017-10-06 NOTE — Telephone Encounter (Signed)
Contact patient to advise disability form is completed and she will need to pay the $10.00 fee once she picks up form.

## 2017-10-06 NOTE — Telephone Encounter (Signed)
Attempted to call patient in regards to Cardiac Rehab - lm on vm °

## 2017-10-07 ENCOUNTER — Ambulatory Visit (INDEPENDENT_AMBULATORY_CARE_PROVIDER_SITE_OTHER): Payer: BLUE CROSS/BLUE SHIELD | Admitting: Neurology

## 2017-10-07 ENCOUNTER — Telehealth (HOSPITAL_COMMUNITY): Payer: Self-pay

## 2017-10-07 DIAGNOSIS — R251 Tremor, unspecified: Secondary | ICD-10-CM | POA: Diagnosis not present

## 2017-10-07 NOTE — Telephone Encounter (Signed)
Patient returned phone call in regards to cardiac rehab - patient is interested in the program. Scheduled orientation on 11/16/2017 at 8:30am. Patient will attend the 9:45am exc class. Went over insurance with patient and stated she understands. Mailed packed.

## 2017-10-07 NOTE — Telephone Encounter (Signed)
Patient notified and will pick up form

## 2017-10-08 ENCOUNTER — Telehealth: Payer: Self-pay | Admitting: *Deleted

## 2017-10-08 NOTE — Progress Notes (Signed)
ELECTROENCEPHALOGRAM REPORT  Date of Study: 10/07/2017  Patient's Name: Amber Young MRN: [id number] Date of Birth: June 22, 1956   Clinical History: Pt is a 56 yer old female with CHF, HTN, HLD, type 2 diabetes and history of stroke presents with focal abnormalities due to right upper extremity shaking.  Medications: NORVASC 10 MG tablet  aspirin EC 81 MG tablet  LIPITOR 40 MG tablet COREG 25 MG tablet  FERROUSUL 325 (65 FE) MG tablet  LASIX 40 MG tablet  APRESOLINE 50 MG tablet  LANTUS 100 UNIT/ML injection  HUMALOG 100 UNIT/ML injection  COZAAR 50 MG tablet  NITROSTAT 0.4 MG SL tablet K-DUR,KLOR-CON 10 MEQ tablet  BRILINTA 90 MG TABS tablet COUMADIN 5 MG tablet  Technical Summary: A multichannel digital EEG recording measured by the international 10-20 system with electrodes applied with paste and impedances below 5000 ohms performed in our laboratory with EKG monitoring in an awake and asleep patient.  Hyperventilation was not performed.  Photic stimulation was performed.  The digital EEG was referentially recorded, reformatted, and digitally filtered in a variety of bipolar and referential montages for optimal display.    Description: The patient is awake and asleep during the recording.  During maximal wakefulness, there is a symmetric, medium voltage 9 Hz posterior dominant rhythm that attenuates with eye opening.  The record is symmetric.  During drowsiness and sleep, there is an increase in theta slowing of the background.  Vertex waves and symmetric sleep spindles were seen.  Pphotic stimulation did not elicit any abnormalities.  There were no epileptiform discharges or electrographic seizures seen.    EKG lead was unremarkable.  Impression: This awake and asleep EEG is normal.    Clinical Correlation: A normal EEG does not exclude a clinical diagnosis of epilepsy.  If further clinical questions remain, prolonged EEG may be helpful.  Clinical correlation is  advised.   Metta Clines, DO

## 2017-10-08 NOTE — Telephone Encounter (Signed)
Patient informed EEG is normal.

## 2017-10-08 NOTE — Telephone Encounter (Signed)
-----   Message from Pieter Partridge, DO sent at 10/08/2017  7:33 AM EST ----- EEG is normal

## 2017-10-13 ENCOUNTER — Ambulatory Visit (INDEPENDENT_AMBULATORY_CARE_PROVIDER_SITE_OTHER): Payer: BLUE CROSS/BLUE SHIELD | Admitting: Pharmacist

## 2017-10-13 DIAGNOSIS — I480 Paroxysmal atrial fibrillation: Secondary | ICD-10-CM

## 2017-10-13 DIAGNOSIS — Z5181 Encounter for therapeutic drug level monitoring: Secondary | ICD-10-CM

## 2017-10-13 LAB — POCT INR: INR: 3.5

## 2017-10-14 ENCOUNTER — Ambulatory Visit (INDEPENDENT_AMBULATORY_CARE_PROVIDER_SITE_OTHER): Payer: BLUE CROSS/BLUE SHIELD | Admitting: Neurology

## 2017-10-14 DIAGNOSIS — M79602 Pain in left arm: Secondary | ICD-10-CM

## 2017-10-14 DIAGNOSIS — I69354 Hemiplegia and hemiparesis following cerebral infarction affecting left non-dominant side: Secondary | ICD-10-CM

## 2017-10-14 DIAGNOSIS — G5602 Carpal tunnel syndrome, left upper limb: Secondary | ICD-10-CM

## 2017-10-14 NOTE — Procedures (Signed)
[facility]  [place], [address]  [place], [place] [address] Tel: [phone] Fax:  [phone] Test Date:  10/14/2017  Patient: Amber Young DOB: 1955/10/21 Physician: [name], DO  Sex: Female Height: 5\' 6"  Ref Phys: Metta Clines, D.O.  ID#: [id number] Temp: 36.0C Technician:    Patient Complaints: This is a 62 year-old female with prior history of stroke with residual mild left hemiparesis referred for evaluation of left arm.pain.  NCV & EMG Findings: Extensive electrodiagnostic testing of the left upper extremity shows:  1. Left median, ulnar, and radial sensory responses are within normal limits. Left mixed palmar sensory response shows prolonged latency. 2. Left median response is within normal limits. Left ulnar motor response shows mildly reduced amplitude, and in the setting of normal needle electrode examination of the ulnar innervated muscles, this may reflect atrophy secondary to history of stroke. 3. There is no evidence of active or chronic motor axon loss changes affecting any of the tested muscles. Motor unit configuration and recruitment pattern is within normal limits.  Impression: Left median neuropathy at or distal to the wrist, consistent with clinical diagnosis of carpal tunnel syndrome. Overall, these findings are very mild in degree electrically.  ___________________________ [name], DO    Nerve Conduction Studies Anti Sensory Summary Table   Stim Site NR Peak (ms) Norm Peak (ms) P-T Amp (V) Norm P-T Amp  Left Median Anti Sensory (2nd Digit)  36C  Wrist    3.3 <3.8 13.1 >10  Left Radial Anti Sensory (Base 1st Digit)  36C  Wrist    2.4 <2.8 14.3 >10  Left Ulnar Anti Sensory (5th Digit)  36C  Wrist    3.0 <3.2 13.3 >5   Motor Summary Table   Stim Site NR Onset (ms) Norm Onset (ms) O-P Amp (mV) Norm O-P Amp Site1 Site2 Delta-0 (ms) Dist (cm) Vel (m/s) Norm Vel (m/s)  Left Median Motor (Abd Poll Brev)  36C  Wrist    3.7  <4.0 6.6 >5 Elbow Wrist 5.3 28.0 53 >50  Elbow    9.0  5.8         Left Ulnar Motor (Abd Dig Minimi)  36C  Wrist    2.9 <3.1 6.3 >7 B Elbow Wrist 3.5 22.0 63 >50  B Elbow    6.4  5.4  A Elbow B Elbow 1.9 10.0 53 >50  A Elbow    8.3  5.0          Comparison Summary Table   Stim Site NR Peak (ms) Norm Peak (ms) P-T Amp (V) Site1 Site2 Delta-P (ms) Norm Delta (ms)  Left Median/Ulnar Palm Comparison (Wrist - 8cm)  36C  Median Palm    2.2 <2.2 26.7 Median Palm Ulnar Palm 0.6   Ulnar Palm    1.6 <2.2 15.1       EMG   Side Muscle Ins Act Fibs Psw Fasc Number Recrt Dur Dur. Amp Amp. Poly Poly. Comment  Left 1stDorInt Nml Nml Nml Nml Nml Nml Nml Nml Nml Nml Nml Nml N/A  Left Abd Poll Brev Nml Nml Nml Nml Nml Nml Nml Nml Nml Nml Nml Nml N/A  Left PronatorTeres Nml Nml Nml Nml Nml Nml Nml Nml Nml Nml Nml Nml N/A  Left Biceps Nml Nml Nml Nml Nml Nml Nml Nml Nml Nml Nml Nml N/A  Left Triceps Nml Nml Nml Nml Nml Nml Nml Nml Nml Nml Nml Nml N/A  Left Deltoid Nml Nml Nml Nml Nml  Nml Nml Nml Nml Nml Nml Nml N/A      Waveforms:

## 2017-10-18 ENCOUNTER — Telehealth: Payer: Self-pay | Admitting: *Deleted

## 2017-10-18 NOTE — Telephone Encounter (Signed)
-----   Message from Pieter Partridge, DO sent at 10/15/2017  2:52 PM EST ----- She can try wearing a wrist splint to see if it helps with the numbness and tingling in the hand.  If it helps, then the cause of the tingling would be carpal tunnel.

## 2017-10-18 NOTE — Telephone Encounter (Signed)
Left message giving patient results and instructions.   

## 2017-10-18 NOTE — Telephone Encounter (Signed)
-----   Message from Pieter Partridge, DO sent at 10/15/2017  2:51 PM EST ----- Nerve test shows evidence of mild carpal tunnel syndrome.  This may be a cause of intermittent numbness in the hand but not the pain.  The pain is likely from injury to the humerus bone and not a nerve issue.

## 2017-10-19 MED FILL — $BRILINTA 90 MG TABLET: 90 | 30 days supply | Qty: 60 | Fill #4

## 2017-10-19 MED FILL — LOSARTAN POTASSIUM 50 MG TA: 50 | 30 days supply | Qty: 60 | Fill #4

## 2017-10-19 MED FILL — ?WARFARIN SODIUM 5 MG TABLE: 5 | 30 days supply | Qty: 34 | Fill #2

## 2017-10-19 NOTE — Progress Notes (Signed)
[facility] Note  10/20/2017     CHIEF COMPLAINT Patient presents for Diabetic Eye Exam   HISTORY OF PRESENT ILLNESS: Amber Young is a 62 y.o. female who presents to the clinic today for:   HPI    Diabetic Eye Exam    Vision fluctuates with blood sugars.  Associated Symptoms Floaters.  Negative for Flashes, Pain, Trauma, Fever, Weight Loss, Scalp Tenderness, Redness, Distortion, Photophobia, Jaw Claudication, Fatigue, Shoulder/Hip pain, Glare and Blind Spot.  Diabetes characteristics include Type 2 and on insulin.  This started 12 years ago.  Blood sugar level fluctuates.  Last Blood Glucose 200.  Last A1C 9.  I, the attending physician,  performed the HPI with the patient and updated documentation appropriately.          Comments    Referral of Dr. Claudine Mouton with [facility] for [facility]. Patient states she feels as if she has a "tent covering her left eye", she occasionally has lines in her vision OS, her vision seems to be better at night time. Pt is DM1 , Bs fluctuates, Bs 200's yesterday(10/19/17).A1C 9 (09/2017). Pt she on insulin. Pt reports she broke her arm last year and does not drive any longer. Denies vits/gtt's        Last edited by Bernarda Caffey, MD on 10/20/2017  9:48 AM. (History)    Pt states DM fluctuates; Pt states a few months ago CBG was very low and states now CBG is "running high"; Pt states she uses insulin injections; Pt reports having stroke x 2 years ago; Pt reports daughter lives with her and is her primary caregiver; Pt states OS VA is "dark like a tint on it" x 1 month; Pt states she saw Dr. Douglass Rivers x 2 years ago and states OS was worse than OD VA; Pt reports she began to wear specs in late twenties; Pt denies any childhood ocular issues;   Referring physician: Scot Jun, [place], [place] [address]  HISTORICAL INFORMATION:   Selected notes from the MEDICAL RECORD NUMBER Referred by Molli Barrows, FNP for DM exam;  LEE-  Ocular Hx-  PMH- DM (A1C - 9.4 (02.13.19)), CKD, HTN, CAD, CHF, hx of stroke    CURRENT MEDICATIONS: No current outpatient medications on file. (Ophthalmic Drugs)   No current facility-administered medications for this visit.  (Ophthalmic Drugs)   Current Outpatient Medications (Other)  Medication Sig  . amLODipine (NORVASC) 10 MG tablet Take 1 tablet (10 mg total) by mouth daily.  Marland Kitchen aspirin EC 81 MG tablet On 10/08/17  STOP Aspirin  . atorvastatin (LIPITOR) 40 MG tablet Take 1 tablet (40 mg total) by mouth daily at 6 PM.  . carvedilol (COREG) 25 MG tablet Take 2 tablets (50 mg total) by mouth 2 (two) times daily.  . ferrous sulfate (FERROUSUL) 325 (65 FE) MG tablet Take 1 tablet (325 mg total) by mouth 3 (three) times daily with meals.  . furosemide (LASIX) 40 MG tablet Take 1 tablet (40 mg total) by mouth daily.  . Glucose Blood (BLOOD GLUCOSE TEST STRIPS) STRP Use as directed  . hydrALAZINE (APRESOLINE) 50 MG tablet Take 1 tablet (50 mg total) by mouth 3 (three) times daily.  . insulin glargine (LANTUS) 100 UNIT/ML injection Inject 0.2 mLs (20 Units total) into the skin at bedtime.  . insulin lispro (HUMALOG) 100 UNIT/ML injection Inject 0.05 mLs (5 Units total) into the skin 3 (three) times daily  with meals.  . Insulin Pen Needle 29G X 10MM MISC Use as directed  . INSULIN SYRINGE .5CC/29G 29G X 1/2" 0.5 ML MISC Use to administer insulin three times daily  . losartan (COZAAR) 50 MG tablet Take 1 tablet (50 mg total) by mouth 2 (two) times daily.  . nitroGLYCERIN (NITROSTAT) 0.4 MG SL tablet Place 1 tablet (0.4 mg total) under the tongue every 5 (five) minutes x 3 doses as needed for chest pain.  . potassium chloride SA (K-DUR,KLOR-CON) 10 MEQ tablet Take 1 tablet with each dose of furosemide, daily.  . ticagrelor (BRILINTA) 90 MG TABS tablet Take 1 tablet (90 mg total) by mouth 2 (two) times daily.  Marland Kitchen warfarin (COUMADIN) 5 MG tablet Take 1 to 1.5 tablets  by mouth daily as directed by coumadin clinic (Patient taking differently: Take 5-7.5 mg by mouth See admin instructions. Take 1 tablet on Sunday, Monday, Wednesday and Friday then take 1 and 1/2 tablets on Tuesday, Thursday and Saturday)   Current Facility-Administered Medications (Other)  Medication Route  . Bevacizumab (AVASTIN) SOLN 1.25 mg Intravitreal      REVIEW OF SYSTEMS: ROS    Positive for: Endocrine, Cardiovascular, Eyes   Negative for: Constitutional, Gastrointestinal, Neurological, Skin, Genitourinary, Musculoskeletal, HENT, Respiratory, Psychiatric, Allergic/Imm, Heme/Lymph   Last edited by Zenovia Jordan, LPN on 1/91/4782  9:56 AM. (History)       ALLERGIES Allergies  Allergen Reactions  . Ace Inhibitors Cough    PAST MEDICAL HISTORY Past Medical History:  Diagnosis Date  . Anemia   . CHF (congestive heart failure) ([place])   . CKD (chronic kidney disease), stage III ([place])   . Coronary artery disease   . GERD (gastroesophageal reflux disease)   . Heart murmur   . Hyperlipidemia   . Hypertension   . Stroke ([place]) 09/2014   numbness left upper lip, finger tips on left hand; "resolved" (05/18/2016)  . Type II diabetes mellitus ([place])    Past Surgical History:  Procedure Laterality Date  . CARDIAC CATHETERIZATION N/A 05/18/2016   Procedure: Left Heart Cath and Coronary Angiography;  Surgeon: Jettie Booze, MD;  Location: [facility];  Service: Cardiovascular;  Laterality: N/A;  . CARDIAC CATHETERIZATION N/A 05/18/2016   Procedure: Coronary Stent Intervention;  Surgeon: Jettie Booze, MD;  Location: [facility];  Service: Cardiovascular;  Laterality: N/A;  . CORONARY ANGIOPLASTY    . CORONARY ANGIOPLASTY WITH STENT PLACEMENT    . CORONARY STENT INTERVENTION N/A 09/07/2017   Procedure: CORONARY STENT INTERVENTION;  Surgeon: Leonie Man, MD;  Location: [facility];  Service: Cardiovascular;  Laterality: N/A;  . LEFT HEART CATH  AND CORONARY ANGIOGRAPHY N/A 09/07/2017   Procedure: LEFT HEART CATH AND CORONARY ANGIOGRAPHY;  Surgeon: Leonie Man, MD;  Location: [facility];  Service: Cardiovascular;  Laterality: N/A;  . TUBAL LIGATION  1980  . VAGINAL HYSTERECTOMY  1999   "partial; fibroids"    FAMILY HISTORY Family History  Problem Relation Age of Onset  . Diabetes Mother   . Hypertension Mother   . COPD Mother   . Heart failure Mother   . Hypertension Sister   . Hypertension Brother     SOCIAL HISTORY Social History   Tobacco Use  . Smoking status: Never Smoker  . Smokeless tobacco: Never Used  Substance Use Topics  . Alcohol use: No    Alcohol/week: 0.0 oz  . Drug use: No  OPHTHALMIC EXAM:  Base Eye Exam    Visual Acuity (Snellen - Linear)      Right Left   Dist cc 20/150 -1 20/100 -1   Dist ph cc 20/100 -1 20/80 -2   Correction:  Glasses       Tonometry (Tonopen, 9:31 AM)      Right Left   Pressure 17 14       Pupils      Dark Light Shape React APD   Right 4 2 Round Slow None   Left 4 2 Round Slow None       Visual Fields (Counting fingers)      Left Right    Full Full       Extraocular Movement      Right Left    Full, Ortho Full, Ortho       Neuro/Psych    Oriented x3:  Yes   Mood/Affect:  Normal       Dilation    Both eyes:  1.0% Mydriacyl, 2.5% Phenylephrine @ 9:31 AM  Phen 10% instilled in OU @ 9:50 AM        Slit Lamp and Fundus Exam    Slit Lamp Exam      Right Left   Lids/Lashes Dermatochalasis - upper lid, mild Meibomian gland dysfunction Dermatochalasis - upper lid, mild Meibomian gland dysfunction   Conjunctiva/Sclera White and quiet White and quiet   Cornea Arcus, trace Punctate epithelial erosions, 1+ Guttata Arcus, trace Punctate epithelial erosions, EBMD, trace Guttata   Anterior Chamber Deep and quiet Deep and quiet   Iris Round and dilated, No NVI Round and dilated, No NVI, persistent pupillary membrane from 0703 to 0130    Lens 2+ Nuclear sclerosis, 2+ Cortical cataract, Vacuoles, iridescent refractile opacities 2+ Nuclear sclerosis, 2+ Cortical cataract, Vacuoles, iridescent refractile opacities   Vitreous Vitreous syneresis +RBC       Fundus Exam      Right Left   Disc Normal hazy view, NVD off inferior disc   C/D Ratio 0.5 0.55   Macula Blunted foveal reflex, +edema, +DBH, +superior exudate +NVE   Vessels Tortuous, ? Early fine NVE    Periphery Attached, 360 DBH red VH clumping inferiorly        Refraction    Wearing Rx      Sphere Cylinder Axis   Right -4.00 +1.50 178   Left -4.00 +1.25 009   Age:  2-3 yr   Type:  SVL       Manifest Refraction      Sphere Cylinder Axis Dist VA   Right -6.00 +3.00 178 20/70-2   Left -4.50 +2.25 009 20/70          IMAGING AND PROCEDURES  Imaging and Procedures for 10/20/17  POCT INR     Component   INR   2.0         OCT, Retina - OU - Both Eyes     Right Eye Quality was borderline. Central Foveal Thickness: 543. Progression has no prior data. Findings include abnormal foveal contour, intraretinal fluid, subretinal fluid, vitreomacular adhesion .   Left Eye Quality was borderline. Central Foveal Thickness: 345. Progression has no prior data. Findings include normal foveal contour, intraretinal fluid, subretinal fluid, intraretinal hyper-reflective material.   Notes *Images captured and stored on drive  Diagnosis / Impression:  Central DME OU  Clinical management:  See below  Abbreviations: NFP - Normal foveal profile. CME - cystoid macular edema. PED -  pigment epithelial detachment. IRF - intraretinal fluid. SRF - subretinal fluid. EZ - ellipsoid zone. ERM - epiretinal membrane. ORA - outer retinal atrophy. ORT - outer retinal tubulation. SRHM - subretinal hyper-reflective material         Fluorescein Angiography Optos (Transit OS)     Right Eye Progression has no prior data. Early phase findings include retinal  neovascularization, vascular perfusion defect, leakage, microaneurysm. Mid/Late phase findings include leakage, microaneurysm, retinal neovascularization, vascular perfusion defect.   Left Eye Progression has no prior data. Early phase findings include leakage, blockage, microaneurysm, retinal neovascularization, vascular perfusion defect. Mid/Late phase findings include blockage, microaneurysm, leakage, retinal neovascularization, vascular perfusion defect.   Notes Impression:  PDR OU Microaneurysms 360 and patches of capillary nonperfusion OU       Intravitreal Injection, Pharmacologic Agent - OS - Left Eye     Time Out 10/20/2017. 11:58 AM. Confirmed correct patient, procedure, site, and patient consented.   Anesthesia Topical anesthesia was used. Anesthetic medications included Tetracaine 0.5%, Lidocaine 2%.   Procedure Preparation included 5% betadine to ocular surface. A supplied needle was used.   Injection: 1.25 mg Bevacizumab 1.25mg /0.55ml   NDC: 10272-536-64    Lot: [id number]     Expiration Date: 11/21/2017   Route: Intravitreal   Site: Left Eye   Waste: 0 mg  Post-op Post injection exam found visual acuity of at least counting fingers. The patient tolerated the procedure well. There were no complications. The patient received written and verbal post procedure care education.                 ASSESSMENT/PLAN:    ICD-10-CM   1. Proliferative diabetic retinopathy of both eyes with macular edema associated with type 2 diabetes mellitus (HCC) 12/04/2017 Fluorescein Angiography Optos (Transit OS)    Intravitreal Injection, Pharmacologic Agent - OS - Left Eye    Bevacizumab (AVASTIN) SOLN 1.25 mg  2. Retinal edema H35.81 OCT, Retina - OU - Both Eyes  3. Vitreous hemorrhage of left eye (HCC) H43.12   4. Essential hypertension I10   5. Hypertensive retinopathy of both eyes H35.033 Fluorescein Angiography Optos (Transit OS)  6. Combined forms of age-related  cataract of both eyes H25.813     1,2. Proliferative diabetic retinopathy with diabetic macular edema, OU - The incidence, risk factors for progression, natural history and treatment options for diabetic retinopathy  were discussed with patient.   - The need for close monitoring of blood glucose, blood pressure, and serum lipids, avoiding cigarette or any type of tobacco, and the need for long term follow up was also discussed with patient. - exam shows significant IRH, macular edema, fine NVE OU; preretinal and vitreous heme OS - FA shows NVE OU - OCT confirms diabetic macular edema, OU The natural history, pathology, and characteristics of diabetic macular edema discussed with patient.  A generalized discussion of the major clinical trials concerning treatment of diabetic macular edema (ETDRS, DCT, SCORE, RISE / RIDE, and ongoing DRCR net studies) was completed.  This discussion included mention of the various approaches to treating diabetic macular edema (observation, laser photocoagulation, anti-VEGF injections with lucentis / Avastin / Eylea, steroid injections with Kenalog / Ozurdex, and intraocular surgery with vitrectomy).  The goal hemoglobin A1C of 6-7 was discussed, as well as importance of smoking cessation and hypertension control.  Need for ongoing treatment and monitoring were specifically discussed with reference to chronic nature of diabetic macular edema. - recommend IVA OU for DME, OS today (will help with  VH), then OD in 2 days - IVA #1 OS today, (03.13.19) - pt wishes to proceed - RBA of procedure discussed, questions answered - informed consent obtained and signed - see procedure note - f/u in 2 days for IVA OD - will need PRP OU after injections  3. Vitreous hemorrhage OS - secondary to #1 above - recommend IVA OS as above to treat DME and to help clear VH faster - VH precautions reviewed -- minimize activities, keep head elevated, avoid ASA/NSAIDs/blood thinners as  able - of note pt on warfarin  4,5. Hypertensive retinopathy OU - discussed importance of tight BP control - monitor  6. 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 - discussed diagnosis and progression - not yet visually significant - monitor for now   Ophthalmic Meds Ordered this visit:  Meds ordered this encounter  Medications  . Bevacizumab (AVASTIN) SOLN 1.25 mg       Return in about 2 days (around 10/22/2017) for F/U DME OU -- inj OD only.  There are no Patient Instructions on file for this visit.   Explained the diagnoses, plan, and follow up with the patient and they expressed understanding.  Patient expressed understanding of the importance of proper follow up care.   This document serves as a record of services personally performed by Gardiner Sleeper, MD, PhD. It was created on their behalf by Catha Brow, [place], a certified ophthalmic assistant. The creation of this record is the provider's dictation and/or activities during the visit.  Electronically signed by: Catha Brow, [place]  10/20/17 11:58 AM    Gardiner Sleeper, M.D., Ph.D. Diseases & Surgery of the Retina and Sunset 10/20/17  I have reviewed the above documentation for accuracy and completeness, and I agree with the above. Gardiner Sleeper, M.D., Ph.D. 10/20/17 11:58 AM     Abbreviations: M myopia (nearsighted); A astigmatism; H hyperopia (farsighted); P presbyopia; Mrx spectacle prescription;  CTL contact lenses; OD right eye; OS left eye; OU both eyes  XT exotropia; ET esotropia; PEK punctate epithelial keratitis; PEE punctate epithelial erosions; DES dry eye syndrome; MGD meibomian gland dysfunction; ATs artificial tears; PFAT's preservative free artificial tears; [place] nuclear sclerotic cataract; PSC posterior subcapsular cataract; ERM epi-retinal membrane; PVD  posterior vitreous detachment; RD retinal detachment; DM diabetes mellitus; DR diabetic retinopathy; NPDR non-proliferative diabetic retinopathy; PDR proliferative diabetic retinopathy; CSME clinically significant macular edema; DME diabetic macular edema; dbh dot blot hemorrhages; CWS cotton wool spot; POAG primary open angle glaucoma; C/D cup-to-disc ratio; HVF humphrey visual field; GVF goldmann visual field; OCT optical coherence tomography; IOP intraocular pressure; BRVO Branch retinal vein occlusion; CRVO central retinal vein occlusion; CRAO central retinal artery occlusion; BRAO branch retinal artery occlusion; RT retinal tear; SB scleral buckle; PPV pars plana vitrectomy; VH Vitreous hemorrhage; PRP panretinal laser photocoagulation; IVK intravitreal kenalog; VMT vitreomacular traction; MH Macular hole;  NVD neovascularization of the disc; NVE neovascularization elsewhere; AREDS age related eye disease study; ARMD age related macular degeneration; POAG primary open angle glaucoma; EBMD epithelial/anterior basement membrane dystrophy; ACIOL anterior chamber intraocular lens; IOL intraocular lens; PCIOL posterior chamber intraocular lens; Phaco/IOL phacoemulsification with intraocular lens placement; [place] photorefractive keratectomy; LASIK laser assisted in situ keratomileusis; HTN hypertension; DM diabetes mellitus; COPD chronic obstructive pulmonary disease

## 2017-10-20 ENCOUNTER — Ambulatory Visit (INDEPENDENT_AMBULATORY_CARE_PROVIDER_SITE_OTHER): Payer: BLUE CROSS/BLUE SHIELD | Admitting: Ophthalmology

## 2017-10-20 ENCOUNTER — Encounter (INDEPENDENT_AMBULATORY_CARE_PROVIDER_SITE_OTHER): Payer: Self-pay | Admitting: Ophthalmology

## 2017-10-20 ENCOUNTER — Ambulatory Visit (INDEPENDENT_AMBULATORY_CARE_PROVIDER_SITE_OTHER): Payer: BLUE CROSS/BLUE SHIELD | Admitting: Pharmacist Clinician (PhC)/ Clinical Pharmacy Specialist

## 2017-10-20 DIAGNOSIS — H25813 Combined forms of age-related cataract, bilateral: Secondary | ICD-10-CM

## 2017-10-20 DIAGNOSIS — I480 Paroxysmal atrial fibrillation: Secondary | ICD-10-CM | POA: Diagnosis not present

## 2017-10-20 DIAGNOSIS — H4312 Vitreous hemorrhage, left eye: Secondary | ICD-10-CM

## 2017-10-20 DIAGNOSIS — H35033 Hypertensive retinopathy, bilateral: Secondary | ICD-10-CM

## 2017-10-20 DIAGNOSIS — H3581 Retinal edema: Secondary | ICD-10-CM | POA: Diagnosis not present

## 2017-10-20 DIAGNOSIS — Z5181 Encounter for therapeutic drug level monitoring: Secondary | ICD-10-CM | POA: Diagnosis not present

## 2017-10-20 DIAGNOSIS — E113513 Type 2 diabetes mellitus with proliferative diabetic retinopathy with macular edema, bilateral: Secondary | ICD-10-CM

## 2017-10-20 DIAGNOSIS — I10 Essential (primary) hypertension: Secondary | ICD-10-CM

## 2017-10-20 LAB — POCT INR: INR: 2

## 2017-10-20 MED ORDER — BEVACIZUMAB CHEMO INJECTION 1.25MG/0.05ML SYRINGE FOR KALEIDOSCOPE
1.2500 mg | INTRAVITREAL | Status: DC
  Administered 2017-10-20: 1.25 mg via INTRAVITREAL

## 2017-10-20 NOTE — Patient Instructions (Signed)
Description   Continue taking 1 tablet daily except 1.5 tablets each Tuesday and Thursday. Repeat INR in 2 week

## 2017-10-21 NOTE — Progress Notes (Signed)
[facility] Note  10/22/2017     CHIEF COMPLAINT Patient presents for Retina Follow Up   HISTORY OF PRESENT ILLNESS: Amber Young is a 62 y.o. female who presents to the clinic today for:   HPI    Retina Follow Up    Patient presents with  Diabetic Retinopathy.  In both eyes.  Severity is mild.  Since onset it is stable.  I, the attending physician,  performed the HPI with the patient and updated documentation appropriately.          Comments    F/U DME OU. Patient states she did notice floaters and flasher as of yesterday, but she feels there is film covering her eye. BS 240 (10/21/17) Patient is ready for Avastin OD today.       Last edited by Bernarda Caffey, MD on 10/22/2017  8:16 AM. (History)      Referring physician: Scot Jun, [address] [place], [place] [address]  HISTORICAL INFORMATION:   Selected notes from the MEDICAL RECORD NUMBER Referred by Molli Barrows, FNP for DM exam;  LEE-  Ocular Hx-  PMH- DM (A1C - 9.4 (02.13.19)), CKD, HTN, CAD, CHF, hx of stroke    CURRENT MEDICATIONS: No current outpatient medications on file. (Ophthalmic Drugs)   No current facility-administered medications for this visit.  (Ophthalmic Drugs)   Current Outpatient Medications (Other)  Medication Sig  . amLODipine (NORVASC) 10 MG tablet Take 1 tablet (10 mg total) by mouth daily.  Marland Kitchen aspirin EC 81 MG tablet On 10/08/17  STOP Aspirin  . atorvastatin (LIPITOR) 40 MG tablet Take 1 tablet (40 mg total) by mouth daily at 6 PM.  . carvedilol (COREG) 25 MG tablet Take 2 tablets (50 mg total) by mouth 2 (two) times daily.  . ferrous sulfate (FERROUSUL) 325 (65 FE) MG tablet Take 1 tablet (325 mg total) by mouth 3 (three) times daily with meals.  . furosemide (LASIX) 40 MG tablet Take 1 tablet (40 mg total) by mouth daily.  . Glucose Blood (BLOOD GLUCOSE TEST STRIPS) STRP Use as directed  . hydrALAZINE (APRESOLINE) 50 MG tablet Take 1 tablet  (50 mg total) by mouth 3 (three) times daily.  . insulin glargine (LANTUS) 100 UNIT/ML injection Inject 0.2 mLs (20 Units total) into the skin at bedtime.  . insulin lispro (HUMALOG) 100 UNIT/ML injection Inject 0.05 mLs (5 Units total) into the skin 3 (three) times daily with meals.  . Insulin Pen Needle 29G X 10MM MISC Use as directed  . INSULIN SYRINGE .5CC/29G 29G X 1/2" 0.5 ML MISC Use to administer insulin three times daily  . losartan (COZAAR) 50 MG tablet Take 1 tablet (50 mg total) by mouth 2 (two) times daily.  . nitroGLYCERIN (NITROSTAT) 0.4 MG SL tablet Place 1 tablet (0.4 mg total) under the tongue every 5 (five) minutes x 3 doses as needed for chest pain.  . potassium chloride SA (K-DUR,KLOR-CON) 10 MEQ tablet Take 1 tablet with each dose of furosemide, daily.  . ticagrelor (BRILINTA) 90 MG TABS tablet Take 1 tablet (90 mg total) by mouth 2 (two) times daily.  Marland Kitchen warfarin (COUMADIN) 5 MG tablet Take 1 to 1.5 tablets by mouth daily as directed by coumadin clinic (Patient taking differently: Take 5-7.5 mg by mouth See admin instructions. Take 1 tablet on Sunday, Monday, Wednesday and Friday then take 1 and 1/2 tablets on Tuesday, Thursday and Saturday)   Current Facility-Administered Medications (Other)  Medication Route  . Bevacizumab (AVASTIN) SOLN 1.25 mg Intravitreal  . Bevacizumab (AVASTIN) SOLN 1.25 mg Intravitreal      REVIEW OF SYSTEMS: ROS    Positive for: Endocrine, Eyes   Negative for: Constitutional, Gastrointestinal, Neurological, Skin, Genitourinary, Musculoskeletal, HENT, Cardiovascular, Respiratory, Psychiatric, Allergic/Imm, Heme/Lymph   Last edited by Zenovia Jordan, LPN on 1/76/1607  3:71 AM. (History)       ALLERGIES Allergies  Allergen Reactions  . Ace Inhibitors Cough    PAST MEDICAL HISTORY Past Medical History:  Diagnosis Date  . Anemia   . CHF (congestive heart failure) ([place])   . CKD (chronic kidney disease), stage III ([place])   . Coronary  artery disease   . GERD (gastroesophageal reflux disease)   . Heart murmur   . Hyperlipidemia   . Hypertension   . Stroke ([place]) 09/2014   numbness left upper lip, finger tips on left hand; "resolved" (05/18/2016)  . Type II diabetes mellitus ([place])    Past Surgical History:  Procedure Laterality Date  . CARDIAC CATHETERIZATION N/A 05/18/2016   Procedure: Left Heart Cath and Coronary Angiography;  Surgeon: Jettie Booze, MD;  Location: [facility];  Service: Cardiovascular;  Laterality: N/A;  . CARDIAC CATHETERIZATION N/A 05/18/2016   Procedure: Coronary Stent Intervention;  Surgeon: Jettie Booze, MD;  Location: [place] [facility];  Service: Cardiovascular;  Laterality: N/A;  . CORONARY ANGIOPLASTY    . CORONARY ANGIOPLASTY WITH STENT PLACEMENT    . CORONARY STENT INTERVENTION N/A 09/07/2017   Procedure: CORONARY STENT INTERVENTION;  Surgeon: Leonie Man, MD;  Location: [facility];  Service: Cardiovascular;  Laterality: N/A;  . LEFT HEART CATH AND CORONARY ANGIOGRAPHY N/A 09/07/2017   Procedure: LEFT HEART CATH AND CORONARY ANGIOGRAPHY;  Surgeon: Leonie Man, MD;  Location: [facility];  Service: Cardiovascular;  Laterality: N/A;  . TUBAL LIGATION  1980  . VAGINAL HYSTERECTOMY  1999   "partial; fibroids"    FAMILY HISTORY Family History  Problem Relation Age of Onset  . Diabetes Mother   . Hypertension Mother   . COPD Mother   . Heart failure Mother   . Hypertension Sister   . Hypertension Brother     SOCIAL HISTORY Social History   Tobacco Use  . Smoking status: Never Smoker  . Smokeless tobacco: Never Used  Substance Use Topics  . Alcohol use: No    Alcohol/week: 0.0 oz  . Drug use: No         OPHTHALMIC EXAM:  Base Eye Exam    Visual Acuity (Snellen - Linear)      Right Left   Dist [place] 20/800 20/300   Dist ph [place] 20/150 +2 20/100       Tonometry (Tonopen, 8:18 AM)      Right Left   Pressure 19 13       Pupils       Dark Light Shape React APD   Right 4 2 Round Slow None   Left 4 2 Round Slow None       Visual Fields (Counting fingers)      Left Right    Full Full       Extraocular Movement      Right Left    Full, Ortho Full, Ortho       Neuro/Psych    Oriented x3:  Yes   Mood/Affect:  Normal       Dilation    Right eye:  1.0% Mydriacyl, 2.5% Phenylephrine @ 8:18 AM        Slit Lamp and Fundus Exam    Slit Lamp Exam      Right Left   Lids/Lashes Dermatochalasis - upper lid, mild Meibomian gland dysfunction Dermatochalasis - upper lid, mild Meibomian gland dysfunction   Conjunctiva/Sclera White and quiet White and quiet   Cornea Arcus, trace Punctate epithelial erosions, 1+ Guttata Arcus, trace Punctate epithelial erosions, EBMD, trace Guttata   Anterior Chamber Deep and quiet Deep and quiet   Iris Round and dilated, No NVI Round and dilated, No NVI, persistent pupillary membrane from 0703 to 0130   Lens 2+ Nuclear sclerosis, 2+ Cortical cataract, Vacuoles, iridescent refractile opacities 2+ Nuclear sclerosis, 2+ Cortical cataract, Vacuoles, iridescent refractile opacities   Vitreous Vitreous syneresis +RBC       Fundus Exam      Right Left   Disc Normal hazy view, NVD off inferior disc   C/D Ratio 0.5 0.55   Macula Blunted foveal reflex, +edema, +DBH, +superior exudate +edema   Vessels Tortuous, ? Early fine NVE +NVE   Periphery Attached, 360 DBH red VH clumping inferiorly          IMAGING AND PROCEDURES  Imaging and Procedures for 10/22/17  Intravitreal Injection, Pharmacologic Agent - OD - Right Eye     Time Out 10/22/2017. 8:27 AM. Confirmed correct patient, procedure, site, and patient consented.   Anesthesia Topical anesthesia was used. Anesthetic medications included Lidocaine 2%, Tetracaine 0.5%.   Procedure Preparation included 5% betadine to ocular surface, eyelid speculum. A supplied needle was used.   Injection: 1.25 mg Bevacizumab 1.25mg /0.83ml   NDC:  07371-062-69    Lot: [id number]     Expiration Date: 11/21/2017   Route: Intravitreal   Site: Right Eye   Waste: 0 mg  Post-op Post injection exam found visual acuity of at least counting fingers. The patient tolerated the procedure well. There were no complications. The patient received written and verbal post procedure care education.                 ASSESSMENT/PLAN:    ICD-10-CM   1. Proliferative diabetic retinopathy of both eyes with macular edema associated with type 2 diabetes mellitus (HCC) 12/04/2017 Intravitreal Injection, Pharmacologic Agent - OD - Right Eye    Bevacizumab (AVASTIN) SOLN 1.25 mg  2. Retinal edema H35.81   3. Vitreous hemorrhage of left eye (HCC) H43.12   4. Essential hypertension I10   5. Hypertensive retinopathy of both eyes H35.033   6. Combined forms of age-related cataract of both eyes H25.813     1,2. Proliferative diabetic retinopathy with diabetic macular edema, OU - The incidence, risk factors for progression, natural history and treatment options for diabetic retinopathy  were discussed with patient.   - The need for close monitoring of blood glucose, blood pressure, and serum lipids, avoiding cigarette or any type of tobacco, and the need for long term follow up was also discussed with patient. - exam shows significant IRH, macular edema, fine NVE OU; preretinal and vitreous heme OS - FA shows NVE OU - OCT confirms diabetic macular edema, OU - recommend IVA OU for DME, OS [facility] first on 3.13.19 [facility] - s/p IVA #1 OS 03.13.19 - here today for IVA #1 OD, 03.15.19 - pt wishes to proceed - RBA of procedure discussed, questions answered - informed consent obtained and signed - see procedure note - f/u in 2-3 wks for DFE/OCT and likely PRP OS --  needs PRP OU after injections  3. Vitreous hemorrhage OS - secondary to #1 above - s/p IVA OS on 3.13.19 - pt states floaters are already improving - VH precautions reviewed -- minimize  activities, keep head elevated, avoid ASA/NSAIDs/blood thinners as able - of note pt on warfarin  4,5. Hypertensive retinopathy OU - discussed importance of tight BP control - monitor  6. 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 - discussed diagnosis and progression - not yet visually significant - monitor for now   Ophthalmic Meds Ordered this visit:  Meds ordered this encounter  Medications  . Bevacizumab (AVASTIN) SOLN 1.25 mg       Return for 2-3 wks, Dilated Exam, OCT, Laser.  There are no Patient Instructions on file for this visit.   Explained the diagnoses, plan, and follow up with the patient and they expressed understanding.  Patient expressed understanding of the importance of proper follow up care.   This document serves as a record of services personally performed by Gardiner Sleeper, MD, PhD. It was created on their behalf by Catha Brow, [place], a certified ophthalmic assistant. The creation of this record is the provider's dictation and/or activities during the visit.  Electronically signed by: Catha Brow, [place]  10/22/17 8:35 AM   Gardiner Sleeper, M.D., Ph.D. Diseases & Surgery of the Retina and [place] 10/22/17   I have reviewed the above documentation for accuracy and completeness, and I agree with the above. Gardiner Sleeper, M.D., Ph.D. 10/22/17 8:35 AM     Abbreviations: M myopia (nearsighted); A astigmatism; H hyperopia (farsighted); P presbyopia; Mrx spectacle prescription;  CTL contact lenses; OD right eye; OS left eye; OU both eyes  XT exotropia; ET esotropia; PEK punctate epithelial keratitis; PEE punctate epithelial erosions; DES dry eye syndrome; MGD meibomian gland dysfunction; ATs artificial tears; PFAT's preservative free artificial tears; [place] nuclear sclerotic cataract; PSC posterior subcapsular  cataract; ERM epi-retinal membrane; PVD posterior vitreous detachment; RD retinal detachment; DM diabetes mellitus; DR diabetic retinopathy; NPDR non-proliferative diabetic retinopathy; PDR proliferative diabetic retinopathy; CSME clinically significant macular edema; DME diabetic macular edema; dbh dot blot hemorrhages; CWS cotton wool spot; POAG primary open angle glaucoma; C/D cup-to-disc ratio; HVF humphrey visual field; GVF goldmann visual field; OCT optical coherence tomography; IOP intraocular pressure; BRVO Branch retinal vein occlusion; CRVO central retinal vein occlusion; CRAO central retinal artery occlusion; BRAO branch retinal artery occlusion; RT retinal tear; SB scleral buckle; PPV pars plana vitrectomy; VH Vitreous hemorrhage; PRP panretinal laser photocoagulation; IVK intravitreal kenalog; VMT vitreomacular traction; MH Macular hole;  NVD neovascularization of the disc; NVE neovascularization elsewhere; AREDS age related eye disease study; ARMD age related macular degeneration; POAG primary open angle glaucoma; EBMD epithelial/anterior basement membrane dystrophy; ACIOL anterior chamber intraocular lens; IOL intraocular lens; PCIOL posterior chamber intraocular lens; Phaco/IOL phacoemulsification with intraocular lens placement; [place] photorefractive keratectomy; LASIK laser assisted in situ keratomileusis; HTN hypertension; DM diabetes mellitus; COPD chronic obstructive pulmonary disease

## 2017-10-22 ENCOUNTER — Encounter (INDEPENDENT_AMBULATORY_CARE_PROVIDER_SITE_OTHER): Payer: Self-pay | Admitting: Ophthalmology

## 2017-10-22 ENCOUNTER — Ambulatory Visit (INDEPENDENT_AMBULATORY_CARE_PROVIDER_SITE_OTHER): Payer: BLUE CROSS/BLUE SHIELD | Admitting: Ophthalmology

## 2017-10-22 DIAGNOSIS — E113513 Type 2 diabetes mellitus with proliferative diabetic retinopathy with macular edema, bilateral: Secondary | ICD-10-CM | POA: Diagnosis not present

## 2017-10-22 DIAGNOSIS — H3581 Retinal edema: Secondary | ICD-10-CM

## 2017-10-22 DIAGNOSIS — H35033 Hypertensive retinopathy, bilateral: Secondary | ICD-10-CM

## 2017-10-22 DIAGNOSIS — H25813 Combined forms of age-related cataract, bilateral: Secondary | ICD-10-CM

## 2017-10-22 DIAGNOSIS — H4312 Vitreous hemorrhage, left eye: Secondary | ICD-10-CM

## 2017-10-22 DIAGNOSIS — I10 Essential (primary) hypertension: Secondary | ICD-10-CM

## 2017-10-22 MED ORDER — BEVACIZUMAB CHEMO INJECTION 1.25MG/0.05ML SYRINGE FOR KALEIDOSCOPE
1.2500 mg | INTRAVITREAL | Status: DC
  Administered 2017-10-22: 1.25 mg via INTRAVITREAL

## 2017-10-25 ENCOUNTER — Other Ambulatory Visit: Payer: Self-pay

## 2017-10-25 ENCOUNTER — Encounter: Payer: Self-pay | Admitting: Family Medicine

## 2017-10-25 ENCOUNTER — Ambulatory Visit: Payer: BLUE CROSS/BLUE SHIELD | Admitting: Family Medicine

## 2017-10-25 VITALS — BP 150/66 | HR 60 | Temp 97.9°F | Ht 66.5 in | Wt 176.0 lb

## 2017-10-25 DIAGNOSIS — G5602 Carpal tunnel syndrome, left upper limb: Secondary | ICD-10-CM | POA: Diagnosis not present

## 2017-10-25 DIAGNOSIS — R739 Hyperglycemia, unspecified: Secondary | ICD-10-CM

## 2017-10-25 DIAGNOSIS — E119 Type 2 diabetes mellitus without complications: Secondary | ICD-10-CM | POA: Diagnosis not present

## 2017-10-25 DIAGNOSIS — I10 Essential (primary) hypertension: Secondary | ICD-10-CM | POA: Diagnosis not present

## 2017-10-25 DIAGNOSIS — Z794 Long term (current) use of insulin: Secondary | ICD-10-CM | POA: Diagnosis not present

## 2017-10-25 LAB — POCT URINALYSIS DIP (DEVICE)
Bilirubin Urine: NEGATIVE
GLUCOSE, UA: 500 mg/dL — AB
Ketones, ur: NEGATIVE mg/dL
Leukocytes, UA: NEGATIVE
Nitrite: NEGATIVE
UROBILINOGEN UA: 0.2 mg/dL (ref 0.0–1.0)
pH: 5.5 (ref 5.0–8.0)

## 2017-10-25 LAB — GLUCOSE, POCT (MANUAL RESULT ENTRY): POC Glucose: 334 mg/dl — AB (ref 70–99)

## 2017-10-25 MED ORDER — INJECTION DEVICE FOR INSULIN DEVI
Freq: Once | Status: AC
  Administered 2017-10-25: 10 [IU]

## 2017-10-25 MED ORDER — INJECTION DEVICE FOR INSULIN DEVI: Freq: Once | Status: DC

## 2017-10-25 MED FILL — NITROSTAT 0.4 MG TABLET SL: 0.4 | 25 days supply | Qty: 25 | Fill #0

## 2017-10-25 NOTE — Progress Notes (Signed)
Patient ID: Amber Young, female    DOB: Dec 13, 1955, 62 y.o.   MRN: [id number]  PCP: Scot Jun, FNP  Chief Complaint  Patient presents with  . follow up    vision is still not improved, saw retina MD last week    Subjective:  HPI Amber Young is a 62 y.o. female with heart failure, hypertension, and type 2 diabetes presents for evaluation of hypertension and diabetes follow-up. Amber Young was seen in office 09/22/2017 and was advised to return on today as her blood pressure was severely uncontrolled. She reports today that she has consistently taken blood pressure medication and her daughter is assisting with monitoring of her blood pressure. Of recent Amber Young was seen by a retina eye specialist and diagnosis with proliferative diabetic retinopathy. She has undergone bilateral eye injections recently under the care of Dr. Bernarda Caffey, MD. She did not check blood sugar today. Highest readings since her last visit around 250. She has experienced one episode of hypoglycemia with a blood glucose of 69. Amber Young continues to make poor food choices, although she reports efforts to improve diet.  Reports taking blood pressure medications prior to arrival today. Uncertain of home BP readings. Continues to eat high sodium foods and experience intermittent lower extremity swelling. She denies chest pain, shortness of breath, or new weakness. Social History   Socioeconomic History  . Marital status: Married    Spouse name: Not on file  . Number of children: Not on file  . Years of education: 57  . Highest education level: Not on file  Social Needs  . Financial resource strain: Not on file  . Food insecurity - worry: Not on file  . Food insecurity - inability: Not on file  . Transportation needs - medical: Not on file  . Transportation needs - non-medical: Not on file  Occupational History  . Occupation: hairdresser  Tobacco Use  . Smoking status: Never Smoker  . Smokeless tobacco:  Never Used  Substance and Sexual Activity  . Alcohol use: No    Alcohol/week: 0.0 oz  . Drug use: No  . Sexual activity: Yes  Other Topics Concern  . Not on file  Social History Narrative   Married, 2 children, hairdresser   Caffeine use- occasional tea or coffee   Right handed    Family History  Problem Relation Age of Onset  . Diabetes Mother   . Hypertension Mother   . COPD Mother   . Heart failure Mother   . Hypertension Sister   . Hypertension Brother      Review of Systems  Pertinent negatives listed in HPI  Patient Active Problem List   Diagnosis Date Noted  . CKD (chronic kidney disease), stage III ([place]) 09/07/2017  . Hypertensive urgency 09/07/2017  . Chest pain at rest 09/07/2017  . Chest pain 09/07/2017  . Essential hypertension   . Unstable angina ([place])   . Coronary artery disease involving native coronary artery of native heart with unstable angina pectoris ([place])   . Chronic anticoagulation 01/06/2017  . Non compliance w medication regimen 01/06/2017  . Breast abscess 12/20/2016  . Encounter for therapeutic drug monitoring 05/29/2016  . CAD S/P PCI mLAD DES (Promus 3.0 x 24 -- 3.25 mm) 05/19/2016  . PAF (paroxysmal atrial fibrillation) ([place]) 05/19/2016  . Cardiomyopathy, ischemic     Class: Diagnosis of  . Microcytic anemia 04/26/2016  . Acute on chronic combined systolic (congestive) and diastolic (congestive) heart failure ([place]) 11/14/2015  . Hypokalemia  11/14/2015  . Hypertensive emergency   . Uncontrolled hypertension 09/30/2014  . Uncontrolled diabetes mellitus ([place]) 09/30/2014  . History of stroke 09/30/2014    Allergies  Allergen Reactions  . Ace Inhibitors Cough    Prior to Admission medications   Medication Sig Start Date End Date Taking? Authorizing Provider  amLODipine (NORVASC) 10 MG tablet Take 1 tablet (10 mg total) by mouth daily. 04/26/17  Yes Scot Jun, FNP  atorvastatin (LIPITOR) 40 MG tablet Take 1 tablet (40 mg  total) by mouth daily at 6 PM. 04/26/17  Yes Scot Jun, FNP  carvedilol (COREG) 25 MG tablet Take 2 tablets (50 mg total) by mouth 2 (two) times daily. 07/09/17  Yes Scot Jun, FNP  ferrous sulfate (FERROUSUL) 325 (65 FE) MG tablet Take 1 tablet (325 mg total) by mouth 3 (three) times daily with meals. 05/26/17  Yes Scot Jun, FNP  furosemide (LASIX) 40 MG tablet Take 1 tablet (40 mg total) by mouth daily. 07/09/17 07/04/18 Yes Scot Jun, FNP  Glucose Blood (BLOOD GLUCOSE TEST STRIPS) STRP Use as directed 02/11/17  Yes Hosie Poisson, MD  hydrALAZINE (APRESOLINE) 50 MG tablet Take 1 tablet (50 mg total) by mouth 3 (three) times daily. 09/22/17  Yes Scot Jun, FNP  insulin glargine (LANTUS) 100 UNIT/ML injection Inject 0.2 mLs (20 Units total) into the skin at bedtime. 09/22/17  Yes Scot Jun, FNP  insulin lispro (HUMALOG) 100 UNIT/ML injection Inject 0.05 mLs (5 Units total) into the skin 3 (three) times daily with meals. 09/22/17  Yes Scot Jun, FNP  losartan (COZAAR) 50 MG tablet Take 1 tablet (50 mg total) by mouth 2 (two) times daily. 02/23/17  Yes Scot Jun, FNP  nitroGLYCERIN (NITROSTAT) 0.4 MG SL tablet Place 1 tablet (0.4 mg total) under the tongue every 5 (five) minutes x 3 doses as needed for chest pain. 09/08/17  Yes Debbe Odea, MD  potassium chloride SA (K-DUR,KLOR-CON) 10 MEQ tablet Take 1 tablet with each dose of furosemide, daily. 07/09/17  Yes Scot Jun, FNP  ticagrelor (BRILINTA) 90 MG TABS tablet Take 1 tablet (90 mg total) by mouth 2 (two) times daily. 07/15/17  Yes Tresa Garter, MD  warfarin (COUMADIN) 5 MG tablet Take 1 to 1.5 tablets by mouth daily as directed by coumadin clinic Patient taking differently: Take 5-7.5 mg by mouth See admin instructions. Take 1 tablet on Sunday, Monday, Wednesday and Friday then take 1 and 1/2 tablets on Tuesday, Thursday and Saturday 06/17/17  Yes Herminio Commons,  MD  aspirin EC 81 MG tablet On 10/08/17  STOP Aspirin Patient not taking: Reported on 10/25/2017 09/24/17   Herminio Commons, MD  Insulin Pen Needle 29G X 10MM MISC Use as directed 09/22/17   Scot Jun, FNP  INSULIN SYRINGE .5CC/29G 29G X 1/2" 0.5 ML MISC Use to administer insulin three times daily 09/22/17   Scot Jun, FNP    Past Medical, Surgical Family and Social History reviewed and updated.    Objective:   Today's Vitals   10/25/17 0859 10/25/17 0926  BP: (!) 167/68 (!) 150/66  Pulse: 60   Temp: 97.9 F (36.6 C)   TempSrc: Oral   SpO2: 100%   Weight: 176 lb (79.8 kg)   Height: 5' 6.5" (1.689 m)   PainSc: 0-No pain     Wt Readings from Last 3 Encounters:  10/25/17 176 lb (79.8 kg)  10/05/17 175 lb (79.4 kg)  09/24/17 181 lb (82.1 kg)   Physical Exam Constitutional: She isoriented to person, place, and time. She appearswell-developedand well-nourished.  HENT:  Head:Normocephalicand atraumatic.  Eyes:Conjunctivaeand EOMare normal. Pupils are equal, round, and reactive to light.  Neck:Normal range of motion.Neck supple.  Cardiovascular:Normal rate,regular rhythm,normal heart soundsand intact distal pulses.  Pulmonary/Chest:Effort normaland breath sounds normal.  Abdominal:Soft.Bowel sounds are normal.  Neurological: She isalertand oriented to person, place, and time.  Skin: Skin iswarmand dry.  Psychiatric: She has anormal mood and affect. Herbehavior is normal.Judgmentand thought contentnormal.    Assessment & Plan:  1. Type 2 diabetes mellitus without complication, with long-term current use of insulin ([place]), fasting glucose 334. No medication taken prior to today's office visit. Last A1C 9.4. Reinforced the importance of consistently taking medication as prescribed and correlation to worsening retinopathy with uncontrolled blood sugar.  Administer in office 10 units of subcutaneous short-acting insulin.    2. Accelerated  hypertension, no medication prior to today's office visit. Recheck of BP showed mild improvement. We have discussed target BP range and blood pressure goal. I have advised patient to check BP regularly and to call us back or report to clinic if the numbers are consistently higher than 140/90. We discussed the importance of compliance with medical therapy and DASH diet recommended, consequences of uncontrolled hypertension discussed.  - Continue current BP medications and take consistently.    3. Carpal tunnel syndrome of left wrist, recommend splinting and acetaminophen 500 mg every 6-8 hours as needed for left wrist pain. If no improvement will consider referral to orthopedic surgery.   4. Hyperglycemia, administer 10 units of subcutaneous insulin now.  Recheck blood sugar once you arrive at home.     Orders Placed This Encounter  Procedures  . Wrist splint  . POCT urinalysis dip (device)  . Glucose (CBG)    RTC: 6 weeks for diabetes and hypertension follow-up   Carroll Sage. [name], MSN, FNP-C [facility]  [address] [name], [place] [address] [phone]

## 2017-10-25 NOTE — Patient Instructions (Addendum)
Continue to take blood pressure medications as prescribed and consistently.  Adhere low sodium diet.  Check blood sugar at least once daily, preferable fasting.   Continue Lantus 20 units at bedtime with protein snack-cheese or slice of cold cut meat

## 2017-11-03 ENCOUNTER — Ambulatory Visit (INDEPENDENT_AMBULATORY_CARE_PROVIDER_SITE_OTHER): Payer: BLUE CROSS/BLUE SHIELD | Admitting: Pharmacist Clinician (PhC)/ Clinical Pharmacy Specialist

## 2017-11-03 DIAGNOSIS — Z5181 Encounter for therapeutic drug level monitoring: Secondary | ICD-10-CM

## 2017-11-03 DIAGNOSIS — I480 Paroxysmal atrial fibrillation: Secondary | ICD-10-CM

## 2017-11-03 LAB — POCT INR: INR: 3.9

## 2017-11-03 NOTE — Patient Instructions (Signed)
Don't take any warfarin today 3/27. Then decrease dose to 1 tablet daily except 1.5 tablets each Tuesday. Repeat INR in 2 weeks.

## 2017-11-09 NOTE — Progress Notes (Signed)
[facility] Note  11/12/2017     CHIEF COMPLAINT Patient presents for Retina Follow Up   HISTORY OF PRESENT ILLNESS: Amber Young is a 62 y.o. female who presents to the clinic today for:   HPI    Retina Follow Up    Patient presents with  Diabetic Retinopathy.  In both eyes.  This started 3 weeks ago.  Severity is mild.  Since onset it is stable.  I, the attending physician,  performed the HPI with the patient and updated documentation appropriately.          Comments    F/U PDR w/ DME OU. Patient states she noticed last week her vision OS  was more blurrier than usual, 'I feel as if I got a patch covering my eye", vision in the OD is getting better. Denies flashes floaters and ocular pain. Bs 105 this am . Pt is ready to have Tx OU if needed today.       Last edited by Bernarda Caffey, MD on 11/12/2017  9:26 AM. (History)    Pt states last week sshe felt that OS had a "screen" come over it; Pt states blood sugar has been "running high";   Referring physician: Scot Jun, [address], [place] [address]  HISTORICAL INFORMATION:   Selected notes from the MEDICAL RECORD NUMBER Referred by Molli Barrows, FNP for DM exam;  LEE-  Ocular Hx-  PMH- DM (A1C - 9.4 (02.13.19)), CKD, HTN, CAD, CHF, hx of stroke    CURRENT MEDICATIONS: Current Outpatient Medications (Ophthalmic Drugs)  Medication Sig  . prednisoLONE acetate (PRED FORTE) 1 % ophthalmic suspension Place 1 drop into the left eye 4 (four) times daily for 7 days.   No current facility-administered medications for this visit.  (Ophthalmic Drugs)   Current Outpatient Medications (Other)  Medication Sig  . amLODipine (NORVASC) 10 MG tablet Take 1 tablet (10 mg total) by mouth daily.  Marland Kitchen aspirin EC 81 MG tablet On 10/08/17  STOP Aspirin  . atorvastatin (LIPITOR) 40 MG tablet Take 1 tablet (40 mg total) by mouth daily at 6 PM.  . carvedilol (COREG) 25 MG tablet Take 2 tablets  (50 mg total) by mouth 2 (two) times daily.  . ferrous sulfate (FERROUSUL) 325 (65 FE) MG tablet Take 1 tablet (325 mg total) by mouth 3 (three) times daily with meals.  . furosemide (LASIX) 40 MG tablet Take 1 tablet (40 mg total) by mouth daily.  . Glucose Blood (BLOOD GLUCOSE TEST STRIPS) STRP Use as directed  . hydrALAZINE (APRESOLINE) 50 MG tablet Take 1 tablet (50 mg total) by mouth 3 (three) times daily.  . insulin glargine (LANTUS) 100 UNIT/ML injection Inject 0.2 mLs (20 Units total) into the skin at bedtime.  . insulin lispro (HUMALOG) 100 UNIT/ML injection Inject 0.05 mLs (5 Units total) into the skin 3 (three) times daily with meals.  . Insulin Pen Needle 29G X 10MM MISC Use as directed  . INSULIN SYRINGE .5CC/29G 29G X 1/2" 0.5 ML MISC Use to administer insulin three times daily  . losartan (COZAAR) 50 MG tablet Take 1 tablet (50 mg total) by mouth 2 (two) times daily.  . nitroGLYCERIN (NITROSTAT) 0.4 MG SL tablet Place 1 tablet (0.4 mg total) under the tongue every 5 (five) minutes x 3 doses as needed for chest pain.  . potassium chloride SA (K-DUR,KLOR-CON) 10 MEQ tablet Take 1 tablet with each dose of furosemide, daily.  Marland Kitchen  ticagrelor (BRILINTA) 90 MG TABS tablet Take 1 tablet (90 mg total) by mouth 2 (two) times daily.  Marland Kitchen warfarin (COUMADIN) 5 MG tablet Take 1 to 1.5 tablets by mouth daily as directed by coumadin clinic (Patient taking differently: Take 5-7.5 mg by mouth See admin instructions. Take 1 tablet on Sunday, Monday, Wednesday and Friday then take 1 and 1/2 tablets on Tuesday, Thursday and Saturday)   Current Facility-Administered Medications (Other)  Medication Route  . Bevacizumab (AVASTIN) SOLN 1.25 mg Intravitreal  . Bevacizumab (AVASTIN) SOLN 1.25 mg Intravitreal      REVIEW OF SYSTEMS: ROS    Positive for: Endocrine, Eyes   Negative for: Constitutional, Gastrointestinal, Neurological, Skin, Genitourinary, Musculoskeletal, HENT, Cardiovascular, Respiratory,  Psychiatric, Allergic/Imm, Heme/Lymph   Last edited by Zenovia Jordan, LPN on 08/15/1094  0:45 AM. (History)       ALLERGIES Allergies  Allergen Reactions  . Ace Inhibitors Cough    PAST MEDICAL HISTORY Past Medical History:  Diagnosis Date  . Anemia   . CHF (congestive heart failure) ([place])   . CKD (chronic kidney disease), stage III ([place])   . Coronary artery disease   . GERD (gastroesophageal reflux disease)   . Heart murmur   . Hyperlipidemia   . Hypertension   . Stroke ([place]) 09/2014   numbness left upper lip, finger tips on left hand; "resolved" (05/18/2016)  . Type II diabetes mellitus ([place])    Past Surgical History:  Procedure Laterality Date  . CARDIAC CATHETERIZATION N/A 05/18/2016   Procedure: Left Heart Cath and Coronary Angiography;  Surgeon: Jettie Booze, MD;  Location: [facility];  Service: Cardiovascular;  Laterality: N/A;  . CARDIAC CATHETERIZATION N/A 05/18/2016   Procedure: Coronary Stent Intervention;  Surgeon: Jettie Booze, MD;  Location: [facility];  Service: Cardiovascular;  Laterality: N/A;  . CORONARY ANGIOPLASTY    . CORONARY ANGIOPLASTY WITH STENT PLACEMENT    . CORONARY STENT INTERVENTION N/A 09/07/2017   Procedure: CORONARY STENT INTERVENTION;  Surgeon: Leonie Man, MD;  Location: [facility];  Service: Cardiovascular;  Laterality: N/A;  . LEFT HEART CATH AND CORONARY ANGIOGRAPHY N/A 09/07/2017   Procedure: LEFT HEART CATH AND CORONARY ANGIOGRAPHY;  Surgeon: Leonie Man, MD;  Location: [facility];  Service: Cardiovascular;  Laterality: N/A;  . TUBAL LIGATION  1980  . VAGINAL HYSTERECTOMY  1999   "partial; fibroids"    FAMILY HISTORY Family History  Problem Relation Age of Onset  . Diabetes Mother   . Hypertension Mother   . COPD Mother   . Heart failure Mother   . Hypertension Sister   . Hypertension Brother     SOCIAL HISTORY Social History   Tobacco Use  . Smoking status: Never  Smoker  . Smokeless tobacco: Never Used  Substance Use Topics  . Alcohol use: No    Alcohol/week: 0.0 oz  . Drug use: No         OPHTHALMIC EXAM:  Base Eye Exam    Visual Acuity (Snellen - Linear)      Right Left   Dist [place] 20/400 CF at 2'   Dist ph [place] 20/150 NI       Tonometry (Tonopen, 8:55 AM)      Right Left   Pressure 19 14       Pupils      Dark Light Shape React APD   Right 3 2 Round Slow None   Left 3 2 Round Slow  None       Visual Fields (Counting fingers)      Left Right    Full Full       Extraocular Movement      Right Left    Full, Ortho Full, Ortho       Neuro/Psych    Oriented x3:  Yes   Mood/Affect:  Normal       Dilation    Both eyes:  1.0% Mydriacyl, 2.5% Phenylephrine @ 8:55 AM        Slit Lamp and Fundus Exam    Slit Lamp Exam      Right Left   Lids/Lashes Dermatochalasis - upper lid, mild Meibomian gland dysfunction Dermatochalasis - upper lid, Meibomian gland dysfunction   Conjunctiva/Sclera White and quiet White and quiet   Cornea Arcus, trace Punctate epithelial erosions, 1+ Guttata Arcus, 3+ Punctate epithelial erosions, EBMD, trace Guttata   Anterior Chamber Deep and quiet Deep and quiet   Iris Round and dilated, No NVI Round and dilated, No NVI, persistent pupillary membrane from 0703 to 0130   Lens 2+ Nuclear sclerosis, 2+ Cortical cataract, Vacuoles, iridescent refractile opacities 2+ Nuclear sclerosis, 2+ Cortical cataract, Vacuoles, iridescent refractile opacities   Vitreous Vitreous syneresis +RBC/diffuse VH       Fundus Exam      Right Left   Disc Normal hazy view, NVD off inferior disc, perfused    C/D Ratio 0.5 0.55   Macula Blunted foveal reflex, +edema -- slightly worse, +DBH, +superior exudate hazy view -- central concentration of VH   Vessels Tortuous, ? Early fine NVE +NVE w/ large preretinal heme inf nasal to disc   Periphery Attached, 360 DBH red VH clumping inferiorly, concentration of VH over macula  fibrosis nasal to disc          IMAGING AND PROCEDURES  Imaging and Procedures for 11/12/17  OCT, Retina - OU - Both Eyes       Right Eye Quality was borderline. Central Foveal Thickness: 717. Progression has worsened. Findings include abnormal foveal contour, intraretinal fluid, subretinal fluid, vitreomacular adhesion .   Left Eye Quality was poor (No view).   Notes *Images captured and stored on drive  Diagnosis / Impression:  Central DME OU OD: interval worsening OS: no view  Clinical management:  See below  Abbreviations: NFP - Normal foveal profile. CME - cystoid macular edema. PED - pigment epithelial detachment. IRF - intraretinal fluid. SRF - subretinal fluid. EZ - ellipsoid zone. ERM - epiretinal membrane. ORA - outer retinal atrophy. ORT - outer retinal tubulation. SRHM - subretinal hyper-reflective material         Panretinal Photocoagulation - OS - Left Eye       LASER PROCEDURE NOTE  Diagnosis:   Proliferative Diabetic Retinopathy, LEFT EYE  Procedure:  Pan-retinal photocoagulation using slit lamp laser, LEFT EYE  Anesthesia:  Topical  Surgeon: Bernarda Caffey, MD, PhD   Informed consent obtained, operative eye marked, and time out performed prior to initiation of laser.   Lumenis [id number] Laser Indirect Ophthalmoscope Power: 290 mW Duration: 100 msec   # spots: 682 spots mostly nasal  Complications: None.  Notes: significant vitreous heme obscuring view and preventing laser up take superior, temporal and inferiorly  RTC: 2 wks  Patient tolerated the procedure well and received written and verbal post-procedure care information/education.                 ASSESSMENT/PLAN:    ICD-10-CM   1. Proliferative  diabetic retinopathy of both eyes with macular edema associated with type 2 diabetes mellitus (HCC) W29.5621 Panretinal Photocoagulation - OS - Left Eye    CANCELED: Intravitreal Injection, Pharmacologic Agent - OD - Right Eye     CANCELED: Intravitreal Injection, Pharmacologic Agent - OS - Left Eye  2. Retinal edema H35.81 OCT, Retina - OU - Both Eyes  3. Vitreous hemorrhage of left eye (HCC) H43.12 Panretinal Photocoagulation - OS - Left Eye  4. Essential hypertension I10   5. Hypertensive retinopathy of both eyes H35.033   6. Combined forms of age-related cataract of both eyes H25.813     1,2. Proliferative diabetic retinopathy with diabetic macular edema, OU - The incidence, risk factors for progression, natural history and treatment options for diabetic retinopathy  were discussed with patient.   - The need for close monitoring of blood glucose, blood pressure, and serum lipids, avoiding cigarette or any type of tobacco, and the need for long term follow up was also discussed with patient. - exam shows significant IRH, macular edema, fine NVE OU; preretinal and vitreous heme OS - FA shows NVE OU - OCT confirms diabetic macular edema, OU - s/p IVA #1 OS (03.13.19)  - s/p IVA #1 OD (03.15.19) - today, on exam, VH slightly more diffuse OS - view clear enough for some PRP OS today (04.05.19) - pt wishes to proceed - RBA of procedure discussed, questions answered - informed consent obtained and signed - see procedure note - f/u April 12th for DFE/OCT, likely injection  3. Vitreous hemorrhage OS - secondary to #1 above - s/p IVA OS on 3.13.19 - pt states floaters are already improving - VH precautions reviewed -- minimize activities, keep head elevated, avoid ASA/NSAIDs/blood thinners as able - of note pt on warfarin - recommend PRP OS as above  4,5. Hypertensive retinopathy OU - discussed importance of tight BP control - monitor  6. 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 - discussed diagnosis and progression - not yet visually significant - monitor for now   Ophthalmic Meds Ordered this visit:   Meds ordered this encounter  Medications  . prednisoLONE acetate (PRED FORTE) 1 % ophthalmic suspension    Sig: Place 1 drop into the left eye 4 (four) times daily for 7 days.    Dispense:  10 mL    Refill:  0       Return in about 1 week (around 11/19/2017) for Dilated Exam, OCT, Possible Injxn.  There are no Patient Instructions on file for this visit.   Explained the diagnoses, plan, and follow up with the patient and they expressed understanding.  Patient expressed understanding of the importance of proper follow up care.   This document serves as a record of services personally performed by Gardiner Sleeper, MD, PhD. It was created on their behalf by Catha Brow, [employer], a certified ophthalmic assistant. The creation of this record is the provider's dictation and/or activities during the visit.  Electronically signed by: Catha Brow, [place]  11/12/17 2:29 PM   Gardiner Sleeper, M.D., Ph.D. Diseases & Surgery of the Retina and Vitreous [facility] 11/12/17  I have reviewed the above documentation for accuracy and completeness, and I agree with the above. Gardiner Sleeper, M.D., Ph.D. 11/12/17 2:29 PM    Abbreviations: M myopia (nearsighted); A astigmatism; H hyperopia (farsighted); P presbyopia; Mrx spectacle prescription;  CTL  contact lenses; OD right eye; OS left eye; OU both eyes  XT exotropia; ET esotropia; PEK punctate epithelial keratitis; PEE punctate epithelial erosions; DES dry eye syndrome; MGD meibomian gland dysfunction; ATs artificial tears; PFAT's preservative free artificial tears; [place] nuclear sclerotic cataract; PSC posterior subcapsular cataract; ERM epi-retinal membrane; PVD posterior vitreous detachment; RD retinal detachment; DM diabetes mellitus; DR diabetic retinopathy; NPDR non-proliferative diabetic retinopathy; PDR proliferative diabetic retinopathy; CSME clinically significant macular edema; DME diabetic macular edema; dbh dot blot  hemorrhages; CWS cotton wool spot; POAG primary open angle glaucoma; C/D cup-to-disc ratio; HVF humphrey visual field; GVF goldmann visual field; OCT optical coherence tomography; IOP intraocular pressure; BRVO Branch retinal vein occlusion; CRVO central retinal vein occlusion; CRAO central retinal artery occlusion; BRAO branch retinal artery occlusion; RT retinal tear; SB scleral buckle; PPV pars plana vitrectomy; VH Vitreous hemorrhage; PRP panretinal laser photocoagulation; IVK intravitreal kenalog; VMT vitreomacular traction; MH Macular hole;  NVD neovascularization of the disc; NVE neovascularization elsewhere; AREDS age related eye disease study; ARMD age related macular degeneration; POAG primary open angle glaucoma; EBMD epithelial/anterior basement membrane dystrophy; ACIOL anterior chamber intraocular lens; IOL intraocular lens; PCIOL posterior chamber intraocular lens; Phaco/IOL phacoemulsification with intraocular lens placement; [place] photorefractive keratectomy; LASIK laser assisted in situ keratomileusis; HTN hypertension; DM diabetes mellitus; COPD chronic obstructive pulmonary disease

## 2017-11-12 ENCOUNTER — Encounter (INDEPENDENT_AMBULATORY_CARE_PROVIDER_SITE_OTHER): Payer: Self-pay | Admitting: Ophthalmology

## 2017-11-12 ENCOUNTER — Ambulatory Visit (INDEPENDENT_AMBULATORY_CARE_PROVIDER_SITE_OTHER): Payer: BLUE CROSS/BLUE SHIELD | Admitting: Ophthalmology

## 2017-11-12 DIAGNOSIS — H35033 Hypertensive retinopathy, bilateral: Secondary | ICD-10-CM | POA: Diagnosis not present

## 2017-11-12 DIAGNOSIS — E113513 Type 2 diabetes mellitus with proliferative diabetic retinopathy with macular edema, bilateral: Secondary | ICD-10-CM

## 2017-11-12 DIAGNOSIS — H4312 Vitreous hemorrhage, left eye: Secondary | ICD-10-CM

## 2017-11-12 DIAGNOSIS — H25813 Combined forms of age-related cataract, bilateral: Secondary | ICD-10-CM

## 2017-11-12 DIAGNOSIS — I10 Essential (primary) hypertension: Secondary | ICD-10-CM | POA: Diagnosis not present

## 2017-11-12 DIAGNOSIS — H3581 Retinal edema: Secondary | ICD-10-CM | POA: Diagnosis not present

## 2017-11-12 MED ORDER — PREDNISOLONE ACETATE 1 % OP SUSP: 1.0000 [drp] | Freq: Four times a day (QID) | OPHTHALMIC | 0 refills | Status: AC

## 2017-11-12 MED FILL — PREDNISOLONE AC 1% EYE DROP: 1 | 7 days supply | Qty: 10 | Fill #0

## 2017-11-15 ENCOUNTER — Telehealth (HOSPITAL_COMMUNITY): Payer: Self-pay | Admitting: Pharmacist

## 2017-11-15 NOTE — Telephone Encounter (Signed)
Cardiac Rehab Medication Review by a Pharmacist  Does the patient  feel that his/her medications are working for him/her?  Ok  Has the patient been experiencing any side effects to the medications prescribed?  Yes - dizziness with hydralazine. Low blood sugar with insulin use  Does the patient measure his/her own blood pressure or blood glucose at home?  yes - hasn't used in ~2 weeks. Remembers one was 160/99.  Does the patient have any problems obtaining medications due to transportation or finances?   no  Understanding of regimen: fair Understanding of indications: good Potential of compliance: fair   Pharmacist comments: Amber Young is a pleasant 46 YOF who endorses compliance with her medications with a few exceptions. She usually takes hydralazine only once a day because taking it more often makes her dizzy. She also injects Lantus up to 3 times a day in smaller increments (8-10 units at a time) based on her blood sugar. I counseled her that Lantus is a long acting insulin that should be taken once daily. Her last instruction from her PCP was to take 20 units at bedtime with a snack. She has had issues with hypoglycemia (BG as low as 19 per patient) so I was hesitant to tell her to always take Lantus as prescribed. I encouraged her to follow up with her PCP regarding dizziness with blood pressure medicines and hypoglycemic issues with insulin.    Amber Young, PharmD PGY1 Pharmacy Resident Email: [email or web address] Pager: [phone] 11/15/2017 5:59 PM   Takes hydralazine once daily Takes iron every other day

## 2017-11-16 ENCOUNTER — Encounter (HOSPITAL_COMMUNITY): Payer: Self-pay

## 2017-11-16 ENCOUNTER — Encounter (HOSPITAL_COMMUNITY)
Admission: RE | Admit: 2017-11-16 | Discharge: 2017-11-16 | Disposition: A | Discharge status: Home / Self Care | Payer: BLUE CROSS/BLUE SHIELD | Source: Ambulatory Visit | Attending: Cardiovascular Disease | Admitting: Cardiovascular Disease

## 2017-11-16 VITALS — BP 140/80 | HR 76 | Ht 67.0 in | Wt 179.2 lb

## 2017-11-16 DIAGNOSIS — I130 Hypertensive heart and chronic kidney disease with heart failure and stage 1 through stage 4 chronic kidney disease, or unspecified chronic kidney disease: Secondary | ICD-10-CM | POA: Insufficient documentation

## 2017-11-16 DIAGNOSIS — Z7902 Long term (current) use of antithrombotics/antiplatelets: Secondary | ICD-10-CM | POA: Diagnosis not present

## 2017-11-16 DIAGNOSIS — Z79899 Other long term (current) drug therapy: Secondary | ICD-10-CM | POA: Diagnosis not present

## 2017-11-16 DIAGNOSIS — Z794 Long term (current) use of insulin: Secondary | ICD-10-CM | POA: Diagnosis not present

## 2017-11-16 DIAGNOSIS — E1122 Type 2 diabetes mellitus with diabetic chronic kidney disease: Secondary | ICD-10-CM | POA: Insufficient documentation

## 2017-11-16 DIAGNOSIS — Z955 Presence of coronary angioplasty implant and graft: Secondary | ICD-10-CM | POA: Diagnosis not present

## 2017-11-16 DIAGNOSIS — K219 Gastro-esophageal reflux disease without esophagitis: Secondary | ICD-10-CM | POA: Insufficient documentation

## 2017-11-16 DIAGNOSIS — N183 Chronic kidney disease, stage 3 (moderate): Secondary | ICD-10-CM | POA: Insufficient documentation

## 2017-11-16 DIAGNOSIS — I509 Heart failure, unspecified: Secondary | ICD-10-CM | POA: Diagnosis not present

## 2017-11-16 DIAGNOSIS — E785 Hyperlipidemia, unspecified: Secondary | ICD-10-CM | POA: Insufficient documentation

## 2017-11-16 DIAGNOSIS — I2510 Atherosclerotic heart disease of native coronary artery without angina pectoris: Secondary | ICD-10-CM | POA: Diagnosis not present

## 2017-11-16 DIAGNOSIS — Z8673 Personal history of transient ischemic attack (TIA), and cerebral infarction without residual deficits: Secondary | ICD-10-CM | POA: Insufficient documentation

## 2017-11-16 NOTE — Progress Notes (Signed)
Cardiac Individual Treatment Plan  Patient Details  Name: Amber Young MRN: [id number] Date of Birth: 02/15/1956 Referring Provider:   Flowsheet Row CARDIAC REHAB PHASE II ORIENTATION from 11/16/2017 in [facility]  Referring Provider  Kate Sable, MD.      Initial Encounter Date:  Flowsheet Row CARDIAC REHAB PHASE II ORIENTATION from 11/16/2017 in [facility]  Date  11/16/17  Referring Provider  Kate Sable, MD.      Visit Diagnosis: Stented coronary artery  Patient's Home Medications on Admission:  Current Outpatient Medications:  .  amLODipine (NORVASC) 10 MG tablet, Take 1 tablet (10 mg total) by mouth daily., Disp: 90 tablet, Rfl: 2 .  atorvastatin (LIPITOR) 40 MG tablet, Take 1 tablet (40 mg total) by mouth daily at 6 PM., Disp: 90 tablet, Rfl: 1 .  carvedilol (COREG) 25 MG tablet, Take 2 tablets (50 mg total) by mouth 2 (two) times daily., Disp: 60 tablet, Rfl: 3 .  ferrous sulfate (FERROUSUL) 325 (65 FE) MG tablet, Take 1 tablet (325 mg total) by mouth 3 (three) times daily with meals., Disp: 90 tablet, Rfl: 3 .  furosemide (LASIX) 40 MG tablet, Take 1 tablet (40 mg total) by mouth daily., Disp: 90 tablet, Rfl: 3 .  Glucose Blood (BLOOD GLUCOSE TEST STRIPS) STRP, Use as directed, Disp: 200 each, Rfl: 0 .  hydrALAZINE (APRESOLINE) 50 MG tablet, Take 1 tablet (50 mg total) by mouth 3 (three) times daily., Disp: 90 tablet, Rfl: 1 .  insulin glargine (LANTUS) 100 UNIT/ML injection, Inject 0.2 mLs (20 Units total) into the skin at bedtime., Disp: 20 mL, Rfl: 1 .  insulin lispro (HUMALOG) 100 UNIT/ML injection, Inject 0.05 mLs (5 Units total) into the skin 3 (three) times daily with meals., Disp: 30 mL, Rfl: 3 .  Insulin Pen Needle 29G X 10MM MISC, Use as directed, Disp: 100 each, Rfl: 3 .  INSULIN SYRINGE .5CC/29G 29G X 1/2" 0.5 ML MISC, Use to administer insulin three times daily, Disp: 200 each, Rfl: 5 .   losartan (COZAAR) 50 MG tablet, Take 1 tablet (50 mg total) by mouth 2 (two) times daily., Disp: 90 tablet, Rfl: 3 .  nitroGLYCERIN (NITROSTAT) 0.4 MG SL tablet, Place 1 tablet (0.4 mg total) under the tongue every 5 (five) minutes x 3 doses as needed for chest pain., Disp: 30 tablet, Rfl: 12 .  potassium chloride SA (K-DUR,KLOR-CON) 10 MEQ tablet, Take 1 tablet with each dose of furosemide, daily., Disp: 30 tablet, Rfl: 2 .  prednisoLONE acetate (PRED FORTE) 1 % ophthalmic suspension, Place 1 drop into the left eye 4 (four) times daily for 7 days., Disp: 10 mL, Rfl: 0 .  ticagrelor (BRILINTA) 90 MG TABS tablet, Take 1 tablet (90 mg total) by mouth 2 (two) times daily., Disp: 180 tablet, Rfl: 3 .  warfarin (COUMADIN) 5 MG tablet, Take 1 to 1.5 tablets by mouth daily as directed by coumadin clinic (Patient taking differently: Take 5-7.5 mg by mouth See admin instructions. Take 1 tablet on Sunday, Monday, Wednesday and Friday then take 1 and 1/2 tablets on Tuesday, Thursday and Saturday), Disp: 120 tablet, Rfl: 1  Current Facility-Administered Medications:  .  Bevacizumab (AVASTIN) SOLN 1.25 mg, 1.25 mg, Intravitreal, , Bernarda Caffey, MD, 1.25 mg at 10/20/17 1158 .  Bevacizumab (AVASTIN) SOLN 1.25 mg, 1.25 mg, Intravitreal, , Bernarda Caffey, MD, 1.25 mg at 10/22/17 0827  Past Medical History: Past Medical History:  Diagnosis Date  .  Anemia   . CHF (congestive heart failure) ([place])   . CKD (chronic kidney disease), stage III ([facility])   . Coronary artery disease   . GERD (gastroesophageal reflux disease)   . Heart murmur   . Hyperlipidemia   . Hypertension   . Stroke ([place]) 09/2014   numbness left upper lip, finger tips on left hand; "resolved" (05/18/2016)  . Type II diabetes mellitus ([facility])     Tobacco Use: Social History   Tobacco Use  Smoking Status Never Smoker  Smokeless Tobacco Never Used    Labs: Recent Review Flowsheet Data    Labs for ITP Cardiac and Pulmonary Rehab Latest Ref Rng  & Units 12/20/2016 01/05/2017 04/26/2017 07/09/2017 09/22/2017   Cholestrol <200 mg/dL - 190 - - -   LDLCALC <100 mg/dL - 95 - - -   HDL >50 mg/dL - 80 - - -   Trlycerides <150 mg/dL - 74 - - -   Hemoglobin A1c - 15.4(H) - 10.3 13.0 9.4   TCO2 0 - 100 mmol/L 26 - - - -      Capillary Blood Glucose: Lab Results  Component Value Date   GLUCAP 140 (H) 09/08/2017   GLUCAP 151 (H) 09/08/2017   GLUCAP 96 09/08/2017   GLUCAP 57 (L) 09/08/2017   GLUCAP 223 (H) 09/07/2017     Exercise Target Goals: Date: 11/16/17  Exercise Program Goal: Individual exercise prescription set using results from initial 6 min walk test and THRR while considering  patient's activity barriers and safety.   Exercise Prescription Goal: Initial exercise prescription builds to 30-45 minutes a day of aerobic activity, 2-3 days per week.  Home exercise guidelines will be given to patient during program as part of exercise prescription that the participant will acknowledge.  Activity Barriers & Risk Stratification: Activity Barriers & Cardiac Risk Stratification - 11/16/17 0949    Activity Barriers & Cardiac Risk Stratification          Activity Barriers  Deconditioning;Muscular Weakness;Arthritis;Other (comment)    Comments  broke L arm in 2018    Cardiac Risk Stratification  Moderate           6 Minute Walk: 6 Minute Walk    6 Minute Walk    Row Name 11/16/17 1133   Phase  Initial   Distance  1000 feet   Walk Time  6 minutes   # of Rest Breaks  1 Patient rested at the 5 minute mark until the end of the walk test.   MPH  1.89   METS  3.24   RPE  11   VO2 Peak  11.35   Symptoms  Yes (comment)   Comments  Patient c/o leg fatigue and shortness of breath.   Resting HR  76 bpm   Resting BP  140/80   Resting Oxygen Saturation   98 %   Exercise Oxygen Saturation  during 6 min walk  97 %   Max Ex. HR  116 bpm   Max Ex. BP  160/80   2 Minute Post BP  126/64          Oxygen Initial  Assessment:   Oxygen Re-Evaluation:   Oxygen Discharge (Final Oxygen Re-Evaluation):   Initial Exercise Prescription: Initial Exercise Prescription - 11/16/17 1100    Date of Initial Exercise RX and Referring Provider          Date  11/16/17    Referring Provider  Kate Sable, MD.  Bike          Level  1    Minutes  10    METs  3.33        NuStep          Level  1    SPM  85    Minutes  10    METs  2        Track          Laps  8    Minutes  10    METs  2.39        Prescription Details          Frequency (times per week)  3    Duration  Progress to 30 minutes of continuous aerobic without signs/symptoms of physical distress        Intensity          THRR 40-80% of Max Heartrate  65-130    Ratings of Perceived Exertion  11-13    Perceived Dyspnea  0-4        Progression          Progression  Continue to progress workloads to maintain intensity without signs/symptoms of physical distress.        Resistance Training          Training Prescription  Yes    Weight  1lb    Reps  10-15           Perform Capillary Blood Glucose checks as needed.  Exercise Prescription Changes:   Exercise Comments:   Exercise Goals and Review: Exercise Goals    Exercise Goals    Row Name 11/16/17 0951   Increase Physical Activity  Yes   Intervention  Provide advice, education, support and counseling about physical activity/exercise needs.;Develop an individualized exercise prescription for aerobic and resistive training based on initial evaluation findings, risk stratification, comorbidities and participant's personal goals.   Expected Outcomes  Short Term: Attend rehab on a regular basis to increase amount of physical activity.;Long Term: Add in home exercise to make exercise part of routine and to increase amount of physical activity.;Long Term: Exercising regularly at least 3-5 days a week.   Increase Strength and Stamina  Yes   Intervention   Provide advice, education, support and counseling about physical activity/exercise needs.;Develop an individualized exercise prescription for aerobic and resistive training based on initial evaluation findings, risk stratification, comorbidities and participant's personal goals.   Expected Outcomes  Short Term: Increase workloads from initial exercise prescription for resistance, speed, and METs.;Short Term: Perform resistance training exercises routinely during rehab and add in resistance training at home;Long Term: Improve cardiorespiratory fitness, muscular endurance and strength as measured by increased METs and functional capacity (6MWT)   Able to understand and use rate of perceived exertion (RPE) scale  Yes   Intervention  Provide education and explanation on how to use RPE scale   Expected Outcomes  Short Term: Able to use RPE daily in rehab to express subjective intensity level;Long Term:  Able to use RPE to guide intensity level when exercising independently   Knowledge and understanding of Target Heart Rate Range (THRR)  Yes   Intervention  Provide education and explanation of THRR including how the numbers were predicted and where they are located for reference   Expected Outcomes  Long Term: Able to use THRR to govern intensity when exercising independently;Short Term: Able to state/look up THRR;Short Term: Able to use daily as guideline for intensity in rehab  Able to check pulse independently  Yes   Intervention  Provide education and demonstration on how to check pulse in carotid and radial arteries.;Review the importance of being able to check your own pulse for safety during independent exercise   Expected Outcomes  Short Term: Able to explain why pulse checking is important during independent exercise;Long Term: Able to check pulse independently and accurately   Understanding of Exercise Prescription  Yes   Intervention  Provide education, explanation, and written materials on  patient's individual exercise prescription   Expected Outcomes  Short Term: Able to explain program exercise prescription;Long Term: Able to explain home exercise prescription to exercise independently          Exercise Goals Re-Evaluation :    Discharge Exercise Prescription (Final Exercise Prescription Changes):   Nutrition:  Target Goals: Understanding of nutrition guidelines, daily intake of sodium 1500mg , cholesterol 200mg , calories 30% from fat and 7% or less from saturated fats, daily to have 5 or more servings of fruits and vegetables.  Biometrics: Pre Biometrics - 11/16/17 1145    Pre Biometrics          Height  5\' 7"  (1.702 m)    Weight  179 lb 3.7 oz (81.3 kg)    Waist Circumference  38 inches    Hip Circumference  43 inches    Waist to Hip Ratio  0.88 %    BMI (Calculated)  28.07    Triceps Skinfold  31 mm    % Body Fat  39.8 %    Grip Strength  26 kg    Flexibility  0 in    Single Leg Stand  3.18 seconds            Nutrition Therapy Plan and Nutrition Goals: Nutrition Therapy & Goals - 11/16/17 1149    Nutrition Therapy          Diet  Carb Modified, Heart Healthy        Personal Nutrition Goals          Nutrition Goal  Pt to identify food quantities necessary to achieve weight loss of 6-24 lb (2.7-10.9 kg) at graduation from cardiac rehab. Goal wt of 150-160 lb desired.     Personal Goal #2  Pt to watch out for sweets and added sugar.(e.g. Gingerale/regular sodas, ice cream)    Personal Goal #3  Improved blood glucose control as evidenced by pt's A1c trending from 9.4 toward less than 7.0.        Intervention Plan          Intervention  Prescribe, educate and counsel regarding individualized specific dietary modifications aiming towards targeted core components such as weight, hypertension, lipid management, diabetes, heart failure and other comorbidities.    Expected Outcomes  Short Term Goal: Understand basic principles of dietary content, such  as calories, fat, sodium, cholesterol and nutrients.;Long Term Goal: Adherence to prescribed nutrition plan.           Nutrition Assessments: Nutrition Assessments - 11/16/17 1149    MEDFICTS Scores          Pre Score  46           Nutrition Goals Re-Evaluation:   Nutrition Goals Re-Evaluation:   Nutrition Goals Discharge (Final Nutrition Goals Re-Evaluation):   Psychosocial: Target Goals: Acknowledge presence or absence of significant depression and/or stress, maximize coping skills, provide positive support system. Participant is able to verbalize types and ability to use techniques and skills needed for reducing stress  and depression.  Initial Review & Psychosocial Screening: Initial Psych Review & Screening - 11/16/17 0906    Initial Review          Current issues with  None Identified;Current Stress Concerns    Source of Stress Concerns  Chronic Illness;Unable to participate in former interests or hobbies;Unable to perform yard/household activities    Comments  diabetic retinopathy         Family Dynamics          Good Support System?  Yes husband, children, family        Barriers          Psychosocial barriers to participate in program  There are no identifiable barriers or psychosocial needs.        Screening Interventions          Interventions  Encouraged to exercise           Quality of Life Scores: Quality of Life - 11/16/17 0928    Quality of Life Scores          Health/Function Pre  22.2 %    Socioeconomic Pre  22.69 %    Psych/Spiritual Pre  24 %    Family Pre  21.6 %    GLOBAL Pre  22.59 %          Scores of 19 and below usually indicate a poorer quality of life in these areas.  A difference of  2-3 points is a clinically meaningful difference.  A difference of 2-3 points in the total score of the Quality of Life Index has been associated with significant improvement in overall quality of life, self-image, physical symptoms, and  general health in studies assessing change in quality of life.  PHQ-9: Recent Review Flowsheet Data    Depression screen [facility] 2/9 10/25/2017 09/22/2017 07/09/2017 05/24/2017 04/26/2017   Decreased Interest 0 0 0 0 0   Down, Depressed, Hopeless 0 0 0 0 0   PHQ - 2 Score 0 0 0 0 0     Interpretation of Total Score  Total Score Depression Severity:  1-4 = Minimal depression, 5-9 = Mild depression, 10-14 = Moderate depression, 15-19 = Moderately severe depression, 20-27 = Severe depression   Psychosocial Evaluation and Intervention:   Psychosocial Re-Evaluation:   Psychosocial Discharge (Final Psychosocial Re-Evaluation):   Vocational Rehabilitation: Provide vocational rehab assistance to qualifying candidates.   Vocational Rehab Evaluation & Intervention: Vocational Rehab - 11/16/17 0905    Initial Vocational Rehab Evaluation & Intervention          Assessment shows need for Vocational Rehabilitation  No           Education: Education Goals: Education classes will be provided on a weekly basis, covering required topics. Participant will state understanding/return demonstration of topics presented.  Learning Barriers/Preferences: Learning Barriers/Preferences - 11/16/17 0948    Learning Barriers/Preferences          Learning Barriers  Sight    Learning Preferences  Video;Written Material;Pictoral;Computer/Internet           Education Topics: Count Your Pulse:  -Group instruction provided by verbal instruction, demonstration, patient participation and written materials to support subject.  Instructors address importance of being able to find your pulse and how to count your pulse when at home without a heart monitor.  Patients get hands on experience counting their pulse with staff help and individually.   Heart Attack, Angina, and Risk Factor Modification:  -Group instruction provided by verbal instruction,  video, and written materials to support subject.  Instructors  address signs and symptoms of angina and heart attacks.    Also discuss risk factors for heart disease and how to make changes to improve heart health risk factors.   Functional Fitness:  -Group instruction provided by verbal instruction, demonstration, patient participation, and written materials to support subject.  Instructors address safety measures for doing things around the house.  Discuss how to get up and down off the floor, how to pick things up properly, how to safely get out of a chair without assistance, and balance training.   Meditation and Mindfulness:  -Group instruction provided by verbal instruction, patient participation, and written materials to support subject.  Instructor addresses importance of mindfulness and meditation practice to help reduce stress and improve awareness.  Instructor also leads participants through a meditation exercise.    Stretching for Flexibility and Mobility:  -Group instruction provided by verbal instruction, patient participation, and written materials to support subject.  Instructors lead participants through series of stretches that are designed to increase flexibility thus improving mobility.  These stretches are additional exercise for major muscle groups that are typically performed during regular warm up and cool down.   Hands Only CPR:  -Group verbal, video, and participation provides a basic overview of AHA guidelines for community CPR. Role-play of emergencies allow participants the opportunity to practice calling for help and chest compression technique with discussion of AED use.   Hypertension: -Group verbal and written instruction that provides a basic overview of hypertension including the most recent diagnostic guidelines, risk factor reduction with self-care instructions and medication management.    Nutrition I class: Heart Healthy Eating:  -Group instruction provided by PowerPoint slides, verbal discussion, and written  materials to support subject matter. The instructor gives an explanation and review of the Therapeutic Lifestyle Changes diet recommendations, which includes a discussion on lipid goals, dietary fat, sodium, fiber, plant stanol/sterol esters, sugar, and the components of a well-balanced, healthy diet.   Nutrition II class: Lifestyle Skills:  -Group instruction provided by PowerPoint slides, verbal discussion, and written materials to support subject matter. The instructor gives an explanation and review of label reading, grocery shopping for heart health, heart healthy recipe modifications, and ways to make healthier choices when eating out.   Diabetes Question & Answer:  -Group instruction provided by PowerPoint slides, verbal discussion, and written materials to support subject matter. The instructor gives an explanation and review of diabetes co-morbidities, pre- and post-prandial blood glucose goals, pre-exercise blood glucose goals, signs, symptoms, and treatment of hypoglycemia and hyperglycemia, and foot care basics.   Diabetes Blitz:  -Group instruction provided by PowerPoint slides, verbal discussion, and written materials to support subject matter. The instructor gives an explanation and review of the physiology behind type 1 and type 2 diabetes, diabetes medications and rational behind using different medications, pre- and post-prandial blood glucose recommendations and Hemoglobin A1c goals, diabetes diet, and exercise including blood glucose guidelines for exercising safely.    Portion Distortion:  -Group instruction provided by PowerPoint slides, verbal discussion, written materials, and food models to support subject matter. The instructor gives an explanation of serving size versus portion size, changes in portions sizes over the last 20 years, and what consists of a serving from each food group.   Stress Management:  -Group instruction provided by verbal instruction, video, and  written materials to support subject matter.  Instructors review role of stress in heart disease and how to cope with  stress positively.     Exercising on Your Own:  -Group instruction provided by verbal instruction, power point, and written materials to support subject.  Instructors discuss benefits of exercise, components of exercise, frequency and intensity of exercise, and end points for exercise.  Also discuss use of nitroglycerin and activating EMS.  Review options of places to exercise outside of rehab.  Review guidelines for sex with heart disease.   Cardiac Drugs I:  -Group instruction provided by verbal instruction and written materials to support subject.  Instructor reviews cardiac drug classes: antiplatelets, anticoagulants, beta blockers, and statins.  Instructor discusses reasons, side effects, and lifestyle considerations for each drug class.   Cardiac Drugs II:  -Group instruction provided by verbal instruction and written materials to support subject.  Instructor reviews cardiac drug classes: angiotensin converting enzyme inhibitors (ACE-I), angiotensin II receptor blockers (ARBs), nitrates, and calcium channel blockers.  Instructor discusses reasons, side effects, and lifestyle considerations for each drug class.   Anatomy and Physiology of the Circulatory System:  Group verbal and written instruction and models provide basic cardiac anatomy and physiology, with the coronary electrical and arterial systems. Review of: AMI, Angina, Valve disease, Heart Failure, Peripheral Artery Disease, Cardiac Arrhythmia, Pacemakers, and the ICD.   Other Education:  -Group or individual verbal, written, or video instructions that support the educational goals of the cardiac rehab program.   Holiday Eating Survival Tips:  -Group instruction provided by PowerPoint slides, verbal discussion, and written materials to support subject matter. The instructor gives patients tips, tricks, and  techniques to help them not only survive but enjoy the holidays despite the onslaught of food that accompanies the holidays.   Knowledge Questionnaire Score: Knowledge Questionnaire Score - 11/16/17 0905    Knowledge Questionnaire Score          Pre Score  19/24           Core Components/Risk Factors/Patient Goals at Admission: Personal Goals and Risk Factors at Admission - 11/16/17 1128    Core Components/Risk Factors/Patient Goals on Admission          Diabetes  Yes    Intervention  Provide education about proper nutrition, including hydration, and aerobic/resistive exercise prescription along with prescribed medications to achieve blood glucose in normal ranges: Fasting glucose 65-99 mg/dL;Provide education about signs/symptoms and action to take for hypo/hyperglycemia.    Expected Outcomes  Short Term: Participant verbalizes understanding of the signs/symptoms and immediate care of hyper/hypoglycemia, proper foot care and importance of medication, aerobic/resistive exercise and nutrition plan for blood glucose control.;Long Term: Attainment of HbA1C < 7%.    Hypertension  Yes    Intervention  Provide education on lifestyle modifcations including regular physical activity/exercise, weight management, moderate sodium restriction and increased consumption of fresh fruit, vegetables, and low fat dairy, alcohol moderation, and smoking cessation.;Monitor prescription use compliance.    Expected Outcomes  Short Term: Continued assessment and intervention until BP is < 140/88mm HG in hypertensive participants. < 130/69mm HG in hypertensive participants with diabetes, heart failure or chronic kidney disease.;Long Term: Maintenance of blood pressure at goal levels.    Personal Goal Other  Yes    Personal Goal  Develop and exercise routine. Improve flexibilty and balance.    Intervention  Provide individulaized exercise prescription that patient can do at home including stretching exercises to help  improve flexibility. Patient will attend functional fitness class, which provides balances exercises that patient can do to help improve balance.    Expected Outcomes  Patient will maintain a regular exercise routine at home. Flexibility and balance will improve as measured by sit and reach test and single leg stand.           Core Components/Risk Factors/Patient Goals Review:    Core Components/Risk Factors/Patient Goals at Discharge (Final Review):    ITP Comments: ITP Comments    Row Name 11/16/17 0851   ITP Comments  Dr. Fransico Him, Medical Director       Comments:Patient attended orientation from  0834to 1000am  to review rules and guidelines for program. Completed 6 minute walk test, Intitial ITP, and exercise prescription.  VSS. Telemetry-sinus rhythm, NSSTT change .  Asymptomatic. Andi Hence, RN, BSN Cardiac Pulmonary Rehab 11/16/17 12:33 PM

## 2017-11-16 NOTE — Progress Notes (Signed)
Nabria Nevin 62 y.o. female DOB: January 09, 1956 MRN: [id number]      Nutrition Note  1. Stented coronary artery    Past Medical History:  Diagnosis Date  . Anemia   . CHF (congestive heart failure) ([place])   . CKD (chronic kidney disease), stage III ([place])   . Coronary artery disease   . GERD (gastroesophageal reflux disease)   . Heart murmur   . Hyperlipidemia   . Hypertension   . Stroke ([place]) 09/2014   numbness left upper lip, finger tips on left hand; "resolved" (05/18/2016)  . Type II diabetes mellitus ([place])    Meds reviewed. Lantus, Humalog noted  HT: Ht Readings from Last 1 Encounters:  10/25/17 5' 6.5" (1.689 m)    WT: Wt Readings from Last 3 Encounters:  10/25/17 176 lb (79.8 kg)  10/05/17 175 lb (79.4 kg)  09/24/17 181 lb (82.1 kg)     BMI 27.97   Current tobacco use? No   Labs:  Lipid Panel     Component Value Date/Time   CHOL 190 01/05/2017 1146   TRIG 74 01/05/2017 1146   HDL 80 01/05/2017 1146   CHOLHDL 2.4 01/05/2017 1146   VLDL 15 01/05/2017 1146   LDLCALC 95 01/05/2017 1146    Lab Results  Component Value Date   HGBA1C 9.4 09/22/2017   CBG (last 3)  No results for input(s): GLUCAP in the last 72 hours.  Nutrition Note Spoke with pt. Nutrition plan and goals reviewed with pt. Pt is following Step 1 of the Therapeutic Lifestyle Changes diet. Pt reports a recent h/o wt loss from 200 lb to 160 lb. Pt current wt is up 16 lb from pt's lowest reported wt. Suspect wt gain due in part to insulin use/better CBG control. Pt wants to lose wt. Pt has not been actively trying to lose wt at this time. Pt is diabetic. Last A1c indicates blood glucose poorly controlled. Pt's A1c has improved over the past. Pt checks CBG's 4-5 times a day or more. Pt expressed understanding of the information reviewed. Pt aware of nutrition education classes offered and plans on attending nutrition classes.  Nutrition Diagnosis ? Food-and nutrition-related knowledge deficit related  to lack of exposure to information as related to diagnosis of: ? CVD ? DM  ? Overweight related to excessive energy intake as evidenced by a BMI of 27.97  Nutrition Intervention ? Pt's individual nutrition plan and goals reviewed with pt.  Nutrition Goal(s):  ? Pt to identify food quantities necessary to achieve weight loss of 6-24 lb (2.7-10.9 kg) at graduation from cardiac rehab. Goal wt of 150-160 lb desired.  ? Pt to watch out for sweets and added sugar.(e.g. Gingerale/regular sodas, ice cream) ? Improved blood glucose control as evidenced by pt's A1c trending from 9.4 toward less than 7.0.  Plan:  Pt to attend nutrition classes ? Nutrition I ? Nutrition II ? Portion Distortion ? Diabetes Blitz ? Diabetes Q & A Will provide client-centered nutrition education as part of interdisciplinary care.   Monitor and evaluate progress toward nutrition goal with team.  Derek Mound, M.Ed, RD, LDN, CDE 11/16/2017 11:24 AM

## 2017-11-17 ENCOUNTER — Other Ambulatory Visit: Payer: Self-pay

## 2017-11-17 ENCOUNTER — Emergency Department (HOSPITAL_COMMUNITY): Payer: BLUE CROSS/BLUE SHIELD

## 2017-11-17 ENCOUNTER — Ambulatory Visit: Payer: Self-pay | Admitting: Neurology

## 2017-11-17 ENCOUNTER — Encounter (HOSPITAL_COMMUNITY): Payer: Self-pay | Admitting: Emergency Medicine

## 2017-11-17 ENCOUNTER — Emergency Department (HOSPITAL_COMMUNITY)
Admission: EM | Admit: 2017-11-17 | Discharge: 2017-11-17 | Disposition: A | Discharge status: Home / Self Care | Payer: BLUE CROSS/BLUE SHIELD | Attending: Emergency Medicine | Admitting: Emergency Medicine

## 2017-11-17 DIAGNOSIS — Z7901 Long term (current) use of anticoagulants: Secondary | ICD-10-CM | POA: Diagnosis not present

## 2017-11-17 DIAGNOSIS — I130 Hypertensive heart and chronic kidney disease with heart failure and stage 1 through stage 4 chronic kidney disease, or unspecified chronic kidney disease: Secondary | ICD-10-CM | POA: Diagnosis not present

## 2017-11-17 DIAGNOSIS — Z955 Presence of coronary angioplasty implant and graft: Secondary | ICD-10-CM | POA: Insufficient documentation

## 2017-11-17 DIAGNOSIS — E1122 Type 2 diabetes mellitus with diabetic chronic kidney disease: Secondary | ICD-10-CM | POA: Diagnosis not present

## 2017-11-17 DIAGNOSIS — J069 Acute upper respiratory infection, unspecified: Secondary | ICD-10-CM | POA: Diagnosis not present

## 2017-11-17 DIAGNOSIS — Z794 Long term (current) use of insulin: Secondary | ICD-10-CM | POA: Insufficient documentation

## 2017-11-17 DIAGNOSIS — D649 Anemia, unspecified: Secondary | ICD-10-CM | POA: Insufficient documentation

## 2017-11-17 DIAGNOSIS — I5040 Unspecified combined systolic (congestive) and diastolic (congestive) heart failure: Secondary | ICD-10-CM | POA: Insufficient documentation

## 2017-11-17 DIAGNOSIS — N183 Chronic kidney disease, stage 3 (moderate): Secondary | ICD-10-CM | POA: Insufficient documentation

## 2017-11-17 DIAGNOSIS — I2510 Atherosclerotic heart disease of native coronary artery without angina pectoris: Secondary | ICD-10-CM | POA: Insufficient documentation

## 2017-11-17 DIAGNOSIS — R0602 Shortness of breath: Secondary | ICD-10-CM | POA: Diagnosis not present

## 2017-11-17 DIAGNOSIS — R05 Cough: Secondary | ICD-10-CM | POA: Diagnosis not present

## 2017-11-17 DIAGNOSIS — Z79899 Other long term (current) drug therapy: Secondary | ICD-10-CM | POA: Insufficient documentation

## 2017-11-17 DIAGNOSIS — J988 Other specified respiratory disorders: Secondary | ICD-10-CM | POA: Diagnosis not present

## 2017-11-17 DIAGNOSIS — R067 Sneezing: Secondary | ICD-10-CM | POA: Diagnosis not present

## 2017-11-17 LAB — BASIC METABOLIC PANEL
Anion gap: 11 (ref 5–15)
BUN: 16 mg/dL (ref 6–20)
CHLORIDE: 105 mmol/L (ref 101–111)
CO2: 21 mmol/L — AB (ref 22–32)
Calcium: 8.4 mg/dL — ABNORMAL LOW (ref 8.9–10.3)
Creatinine, Ser: 1.43 mg/dL — ABNORMAL HIGH (ref 0.44–1.00)
GFR calc non Af Amer: 39 mL/min — ABNORMAL LOW (ref >60)
GFR, EST AFRICAN AMERICAN: 45 mL/min — AB (ref >60)
GLUCOSE: 233 mg/dL — AB (ref 65–99)
Potassium: 3.5 mmol/L (ref 3.5–5.1)
Sodium: 137 mmol/L (ref 135–145)

## 2017-11-17 LAB — CBC
HCT: 26.3 % — ABNORMAL LOW (ref 36.0–46.0)
Hemoglobin: 8.3 g/dL — ABNORMAL LOW (ref 12.0–15.0)
MCH: 23.2 pg — AB (ref 26.0–34.0)
MCHC: 31.6 g/dL (ref 30.0–36.0)
MCV: 73.5 fL — AB (ref 78.0–100.0)
PLATELETS: 350 10*3/uL (ref 150–400)
RBC: 3.58 MIL/uL — AB (ref 3.87–5.11)
RDW: 14.3 % (ref 11.5–15.5)
WBC: 4.7 10*3/uL (ref 4.0–10.5)

## 2017-11-17 LAB — I-STAT TROPONIN, ED: Troponin i, poc: 0.03 ng/mL (ref 0.00–0.08)

## 2017-11-17 LAB — BRAIN NATRIURETIC PEPTIDE: B Natriuretic Peptide: 960.6 pg/mL — ABNORMAL HIGH (ref 0.0–100.0)

## 2017-11-17 LAB — POC OCCULT BLOOD, ED: FECAL OCCULT BLD: NEGATIVE

## 2017-11-17 MED ORDER — FUROSEMIDE 10 MG/ML IJ SOLN
40.0000 mg | Freq: Once | INTRAMUSCULAR | Status: AC
  Administered 2017-11-17: 40 mg via INTRAVENOUS
  Filled 2017-11-17: qty 4

## 2017-11-17 NOTE — Discharge Instructions (Addendum)
Your hemoglobin was low, 8.3. There was no blood in your stool. Your heart failure number was slightly elevated today, you were given some Lasix. You may be coming down with some type of mild, viral upper respiratory infection causing fevers chills cough.   Follow-up with your primary care doctor in 24-48 hours to have a recheck of hemoglobin, shortness of breath.  Return for exertional chest pain, shortness of breath,rectal bleeding.

## 2017-11-17 NOTE — ED Triage Notes (Signed)
Pt to ER for evaluation of 2 days shortness of breath with dry cough. Recent MI in January with stent placement, states hx of CHF, compliant with medications. Pt is in NAD. Hypertensive at triage.

## 2017-11-17 NOTE — ED Notes (Signed)
Pt ambulated in hallway around nurse sta. Pts O2 sats noted to be 96-98% throughout. While Ambulating- 96%-98%on RA

## 2017-11-17 NOTE — ED Provider Notes (Signed)
[facility] EMERGENCY DEPARTMENT Provider Note   CSN: [id number] Arrival date & time: 11/17/17  1751     History   Chief Complaint Chief Complaint  Patient presents with  . Shortness of Breath    HPI Amber Young is a 62 y.o. female paroxysmal atrial fibrillation on warfarin, CAD s/p stents, CHF EF 40-45%, HTN, diabetes on insulin here for evaluation of shortness of breath, can't described it other than " I can't breathe good when I lay down". SOB worse with laying flat and at night time when trying to sleep. Usually sleeps with 2-4 pillows but last 2 days has had to sit up to sleep. Associated with runny nose, dry cough, chest congestion, subjective fever, chills. No sick contacts. No vomiting, diarrhea, melena, hematochezia, CP. Has been compliant with lasix 40 mg at home. Weight usually around 178 and no recent increase recently.   No exertional CP or Sob, dizziness, palpitations, new or worsening LE swelling. Otherwise at baseline state of health.   HPI  Past Medical History:  Diagnosis Date  . Anemia   . CHF (congestive heart failure) ([place])   . CKD (chronic kidney disease), stage III ([place])   . Coronary artery disease   . GERD (gastroesophageal reflux disease)   . Heart murmur   . Hyperlipidemia   . Hypertension   . Stroke ([place]) 09/2014   numbness left upper lip, finger tips on left hand; "resolved" (05/18/2016)  . Type II diabetes mellitus [facility])     Patient Active Problem List   Diagnosis Date Noted  . CKD (chronic kidney disease), stage III ([place]) 09/07/2017  . Hypertensive urgency 09/07/2017  . Chest pain at rest 09/07/2017  . Chest pain 09/07/2017  . Essential hypertension   . Unstable angina ([place])   . Coronary artery disease involving native coronary artery of native heart with unstable angina pectoris ([place])   . Chronic anticoagulation 01/06/2017  . Non compliance w medication regimen 01/06/2017  . Breast abscess 12/20/2016  . Encounter  for therapeutic drug monitoring 05/29/2016  . CAD S/P PCI mLAD DES (Promus 3.0 x 24 -- 3.25 mm) 05/19/2016  . PAF (paroxysmal atrial fibrillation) ([place]) 05/19/2016  . Cardiomyopathy, ischemic     Class: Diagnosis of  . Microcytic anemia 04/26/2016  . Acute on chronic combined systolic (congestive) and diastolic (congestive) heart failure ([place]) 11/14/2015  . Hypokalemia 11/14/2015  . Hypertensive emergency   . Uncontrolled hypertension 09/30/2014  . Uncontrolled diabetes mellitus ([place]) 09/30/2014  . History of stroke 09/30/2014    Past Surgical History:  Procedure Laterality Date  . CARDIAC CATHETERIZATION N/A 05/18/2016   Procedure: Left Heart Cath and Coronary Angiography;  Surgeon: Jettie Booze, MD;  Location: [facility];  Service: Cardiovascular;  Laterality: N/A;  . CARDIAC CATHETERIZATION N/A 05/18/2016   Procedure: Coronary Stent Intervention;  Surgeon: Jettie Booze, MD;  Location: [facility];  Service: Cardiovascular;  Laterality: N/A;  . CORONARY ANGIOPLASTY    . CORONARY ANGIOPLASTY WITH STENT PLACEMENT    . CORONARY STENT INTERVENTION N/A 09/07/2017   Procedure: CORONARY STENT INTERVENTION;  Surgeon: Leonie Man, MD;  Location: [facility];  Service: Cardiovascular;  Laterality: N/A;  . LEFT HEART CATH AND CORONARY ANGIOGRAPHY N/A 09/07/2017   Procedure: LEFT HEART CATH AND CORONARY ANGIOGRAPHY;  Surgeon: Leonie Man, MD;  Location: [facility];  Service: Cardiovascular;  Laterality: N/A;  . TUBAL LIGATION  1980  . VAGINAL HYSTERECTOMY  1999   "partial; fibroids"     OB History    Gravida  2   Para      Term      Preterm      AB      Living  2     SAB      TAB      Ectopic      Multiple      Live Births               Home Medications    Prior to Admission medications   Medication Sig Start Date End Date Taking? Authorizing Provider  acetaminophen (TYLENOL) 500 MG tablet Take 500 mg by mouth as  needed for mild pain.   Yes [provider]  amLODipine (NORVASC) 10 MG tablet Take 1 tablet (10 mg total) by mouth daily. 04/26/17  Yes Scot Jun, FNP  atorvastatin (LIPITOR) 40 MG tablet Take 1 tablet (40 mg total) by mouth daily at 6 PM. 04/26/17  Yes Scot Jun, FNP  carvedilol (COREG) 25 MG tablet Take 2 tablets (50 mg total) by mouth 2 (two) times daily. 07/09/17  Yes Scot Jun, FNP  furosemide (LASIX) 40 MG tablet Take 1 tablet (40 mg total) by mouth daily. 07/09/17 07/04/18 Yes Scot Jun, FNP  Glucose Blood (BLOOD GLUCOSE TEST STRIPS) STRP Use as directed 02/11/17  Yes Hosie Poisson, MD  hydrALAZINE (APRESOLINE) 50 MG tablet Take 1 tablet (50 mg total) by mouth 3 (three) times daily. 09/22/17  Yes Scot Jun, FNP  insulin glargine (LANTUS) 100 UNIT/ML injection Inject 0.2 mLs (20 Units total) into the skin at bedtime. 09/22/17  Yes Scot Jun, FNP  insulin lispro (HUMALOG) 100 UNIT/ML injection Inject 0.05 mLs (5 Units total) into the skin 3 (three) times daily with meals. 09/22/17  Yes Scot Jun, FNP  losartan (COZAAR) 50 MG tablet Take 1 tablet (50 mg total) by mouth 2 (two) times daily. 02/23/17  Yes Scot Jun, FNP  potassium chloride SA (K-DUR,KLOR-CON) 10 MEQ tablet Take 1 tablet with each dose of furosemide, daily. 07/09/17  Yes Scot Jun, FNP  prednisoLONE acetate (PRED FORTE) 1 % ophthalmic suspension Place 1 drop into the left eye 4 (four) times daily for 7 days. 11/12/17 11/19/17 Yes Bernarda Caffey, MD  ticagrelor (BRILINTA) 90 MG TABS tablet Take 1 tablet (90 mg total) by mouth 2 (two) times daily. 07/15/17  Yes Tresa Garter, MD  warfarin (COUMADIN) 5 MG tablet Take 1 to 1.5 tablets by mouth daily as directed by coumadin clinic Patient taking differently: Take 5-7.5 mg by mouth See admin instructions. Take 1 tablet (5 mg) on Sunday, Monday, Wednesday, Thursday, Friday, and Saturday then take 1 and 1/2  tablets (7.5mg ) on Tuesday 06/17/17  Yes Herminio Commons, MD  ferrous sulfate (FERROUSUL) 325 (65 FE) MG tablet Take 1 tablet (325 mg total) by mouth 3 (three) times daily with meals. Patient not taking: Reported on 11/17/2017 05/26/17   Scot Jun, FNP  Insulin Pen Needle 29G X 10MM MISC Use as directed 09/22/17   Scot Jun, FNP  INSULIN SYRINGE .5CC/29G 29G X 1/2" 0.5 ML MISC Use to administer insulin three times daily 09/22/17   Scot Jun, FNP  nitroGLYCERIN (NITROSTAT) 0.4 MG SL tablet Place 1 tablet (0.4 mg total) under the tongue every 5 (five) minutes x 3 doses as needed for chest pain. 09/08/17   Debbe Odea, MD  Family History Family History  Problem Relation Age of Onset  . Diabetes Mother   . Hypertension Mother   . COPD Mother   . Heart failure Mother   . Hypertension Sister   . Hypertension Brother     Social History Social History   Tobacco Use  . Smoking status: Never Smoker  . Smokeless tobacco: Never Used  Substance Use Topics  . Alcohol use: No    Alcohol/week: 0.0 oz  . Drug use: No     Allergies   Ace inhibitors   Review of Systems Review of Systems  Constitutional: Positive for chills and fever.  HENT: Positive for congestion, rhinorrhea and sneezing.   Respiratory: Positive for shortness of breath.   All other systems reviewed and are negative.    Physical Exam Updated Vital Signs BP (!) 200/86   Pulse 62   Temp 99.6 F (37.6 C) (Oral)   Resp 19   SpO2 97%   Physical Exam  Constitutional: She is oriented to person, place, and time. She appears well-developed and well-nourished. No distress.  Non toxic  HENT:  Head: Normocephalic and atraumatic.  Nose: Nose normal.  Mouth/Throat: No oropharyngeal exudate.  Moist mucous membranes   Eyes: Pupils are equal, round, and reactive to light. Conjunctivae and EOM are normal.  Neck: Normal range of motion.  Cardiovascular: Normal rate, regular rhythm and intact  distal pulses.  No murmur heard. RRR. No tachycardia. 2+ DP and radial pulses bilaterally. Trace pitting edema symmetric bilaterally below knee no calf tenderness, LE erythema or warmth.  Pulmonary/Chest: Effort normal and breath sounds normal. No respiratory distress. She has no wheezes. She has no rales.  Abdominal: Soft. Bowel sounds are normal. There is no tenderness.  No G/R/R. No suprapubic or CVA tenderness.   Genitourinary:  Genitourinary Comments: Chaperone was present.  Patient with minimal pain in perianal/rectal area with palpation.  There are no external fissures or external hemorrhoids noted.  No induration or swelling of the perianal skin.   Stool color is brown with no gross blood noted.   No signs of perirectal abscess.   DRE reveals good sphincter tone.    Musculoskeletal: Normal range of motion. She exhibits no deformity.  Neurological: She is alert and oriented to person, place, and time.  Skin: Skin is warm and dry. Capillary refill takes less than 2 seconds.  Psychiatric: She has a normal mood and affect. Her behavior is normal. Judgment and thought content normal.  Nursing note and vitals reviewed.    ED Treatments / Results  Labs (all labs ordered are listed, but only abnormal results are displayed) Labs Reviewed  BASIC METABOLIC PANEL - Abnormal; Notable for the following components:      Result Value   CO2 21 (*)    Glucose, Bld 233 (*)    Creatinine, Ser 1.43 (*)    Calcium 8.4 (*)    GFR calc non Af Amer 39 (*)    GFR calc Af Amer 45 (*)    All other components within normal limits  CBC - Abnormal; Notable for the following components:   RBC 3.58 (*)    Hemoglobin 8.3 (*)    HCT 26.3 (*)    MCV 73.5 (*)    MCH 23.2 (*)    All other components within normal limits  BRAIN NATRIURETIC PEPTIDE - Abnormal; Notable for the following components:   B Natriuretic Peptide 960.6 (*)    All other components within normal limits  I-STAT  TROPONIN, ED  POC  OCCULT BLOOD, ED    EKG EKG Interpretation  Date/Time:  Wednesday November 17 2017 08:21:11 EDT Ventricular Rate:  71 PR Interval:  162 QRS Duration: 88 QT Interval:  444 QTC Calculation: 482 R Axis:   87 Text Interpretation:  Normal sinus rhythm Nonspecific ST and T wave abnormality Prolonged QT Abnormal ECG ST/T changes similar to Jan 2019 Confirmed by Sherwood Gambler [phone]) on 11/17/2017 12:29:41 PM   Radiology Dg Chest 2 View  Result Date: 11/17/2017 CLINICAL DATA:  Pt to ER for evaluation of 2 days shortness of breath with dry cough. Recent MI in January with stent placement, states hx of CHF, compliant with medications. Hx of CKD3, DM2, stroke, and CAD. Never smoker. EXAM: CHEST - 2 VIEW COMPARISON:  09/06/2017 FINDINGS: Normal mediastinum and cardiac silhouette. Normal pulmonary vasculature. No evidence of effusion, infiltrate, or pneumothorax. No acute bony abnormality. Coronary artery calcifications. IMPRESSION: No acute cardiopulmonary process. Electronically Signed   By: Suzy Bouchard M.D.   On: 11/17/2017 09:15    Procedures Procedures (including critical care time)  Medications Ordered in ED Medications  furosemide (LASIX) injection 40 mg (40 mg Intravenous Given 11/17/17 1424)     Initial Impression / Assessment and Plan / ED Course  I have reviewed the triage vital signs and the nursing notes.  Pertinent labs & imaging results that were available during my care of the patient were reviewed by me and considered in my medical decision making (see chart for details).  Clinical Course as of Nov 18 1957  Wed Nov 17, 2017  1151 Creatinine(!): 1.43 [CG]  1151 Hemoglobin(!): 8.3 [CG]  1355 Creatinine(!): 1.43 [CG]  1355 B Natriuretic Peptide(!): 960.6 [CG]  1355 Hemoglobin(!): 8.3 [CG]    Clinical Course User Index [CG] Kinnie Feil, PA-C   62 yo with shortness of breath associated with mild infectious symptoms. No fevers. SOB is non exertional. No pleuritic  or exertional CP. Compliant with warfarin, lasix. Exam remarkable for hypertension. Only trace LE edema symmetric bilaterally, no crackles. Normal SpO2 on ambulation w/o symptoms. No recent increase in weight from baseline.   Labs remarkable for hgb 8.3, hemoccult negative. Creatinine 1.43 and BNP 960.6, not too far off previous. CXR w/o infiltrate but mild congestion.   Suspect SOB may be 2/2 mild viral syndrome in setting of chronic vascular congestion from CHF. She was given lasix in ED. She felt better. Low suspicion for ACS or PE given no CP, tachycardia, tachypnea, hypoxia. She is on coumadin. Pt discharged with f/u with PCP in 48 hours for persistent symptoms and re-check of hgb. ED return precautions given. Pt discussed with Dr. Regenia Skeeter.   Final Clinical Impressions(s) / ED Diagnoses   Final diagnoses:  Shortness of breath  Anemia, unspecified type  Viral upper respiratory illness    ED Discharge Orders    None       Arlean Hopping 11/18/17 Joeseph Amor, MD 11/19/17 [phone]

## 2017-11-18 NOTE — Progress Notes (Deleted)
[facility] Note  11/19/2017     CHIEF COMPLAINT Patient presents for No chief complaint on file.   HISTORY OF PRESENT ILLNESS: Amber Young is a 62 y.o. female who presents to the clinic today for:   Pt states last week sshe felt that OS had a "screen" come over it; Pt states blood sugar has been "running high";   Referring physician: Scot Jun, [address] [place], [place] [address]  HISTORICAL INFORMATION:   Selected notes from the MEDICAL RECORD NUMBER Referred by Molli Barrows, FNP for DM exam;  LEE-  Ocular Hx-  PMH- DM (A1C - 9.4 (02.13.19)), CKD, HTN, CAD, CHF, hx of stroke    CURRENT MEDICATIONS: Current Outpatient Medications (Ophthalmic Drugs)  Medication Sig  . prednisoLONE acetate (PRED FORTE) 1 % ophthalmic suspension Place 1 drop into the left eye 4 (four) times daily for 7 days.   No current facility-administered medications for this visit.  (Ophthalmic Drugs)   Current Outpatient Medications (Other)  Medication Sig  . acetaminophen (TYLENOL) 500 MG tablet Take 500 mg by mouth as needed for mild pain.  Marland Kitchen amLODipine (NORVASC) 10 MG tablet Take 1 tablet (10 mg total) by mouth daily.  Marland Kitchen atorvastatin (LIPITOR) 40 MG tablet Take 1 tablet (40 mg total) by mouth daily at 6 PM.  . carvedilol (COREG) 25 MG tablet Take 2 tablets (50 mg total) by mouth 2 (two) times daily.  . ferrous sulfate (FERROUSUL) 325 (65 FE) MG tablet Take 1 tablet (325 mg total) by mouth 3 (three) times daily with meals. (Patient not taking: Reported on 11/17/2017)  . furosemide (LASIX) 40 MG tablet Take 1 tablet (40 mg total) by mouth daily.  . Glucose Blood (BLOOD GLUCOSE TEST STRIPS) STRP Use as directed  . hydrALAZINE (APRESOLINE) 50 MG tablet Take 1 tablet (50 mg total) by mouth 3 (three) times daily.  . insulin glargine (LANTUS) 100 UNIT/ML injection Inject 0.2 mLs (20 Units total) into the skin at bedtime.  . insulin lispro (HUMALOG) 100  UNIT/ML injection Inject 0.05 mLs (5 Units total) into the skin 3 (three) times daily with meals.  . Insulin Pen Needle 29G X 10MM MISC Use as directed  . INSULIN SYRINGE .5CC/29G 29G X 1/2" 0.5 ML MISC Use to administer insulin three times daily  . losartan (COZAAR) 50 MG tablet Take 1 tablet (50 mg total) by mouth 2 (two) times daily.  . nitroGLYCERIN (NITROSTAT) 0.4 MG SL tablet Place 1 tablet (0.4 mg total) under the tongue every 5 (five) minutes x 3 doses as needed for chest pain.  . potassium chloride SA (K-DUR,KLOR-CON) 10 MEQ tablet Take 1 tablet with each dose of furosemide, daily.  . ticagrelor (BRILINTA) 90 MG TABS tablet Take 1 tablet (90 mg total) by mouth 2 (two) times daily.  Marland Kitchen warfarin (COUMADIN) 5 MG tablet Take 1 to 1.5 tablets by mouth daily as directed by coumadin clinic (Patient taking differently: Take 5-7.5 mg by mouth See admin instructions. Take 1 tablet (5 mg) on Sunday, Monday, Wednesday, Thursday, Friday, and Saturday then take 1 and 1/2 tablets (7.5mg ) on Tuesday)   Current Facility-Administered Medications (Other)  Medication Route  . Bevacizumab (AVASTIN) SOLN 1.25 mg Intravitreal  . Bevacizumab (AVASTIN) SOLN 1.25 mg Intravitreal      REVIEW OF SYSTEMS:    ALLERGIES Allergies  Allergen Reactions  . Ace Inhibitors Cough    PAST MEDICAL HISTORY Past Medical History:  Diagnosis Date  .  Anemia   . CHF (congestive heart failure) ([place])   . CKD (chronic kidney disease), stage III ([place])   . Coronary artery disease   . GERD (gastroesophageal reflux disease)   . Heart murmur   . Hyperlipidemia   . Hypertension   . Stroke ([place]) 09/2014   numbness left upper lip, finger tips on left hand; "resolved" (05/18/2016)  . Type II diabetes mellitus ([place])    Past Surgical History:  Procedure Laterality Date  . CARDIAC CATHETERIZATION N/A 05/18/2016   Procedure: Left Heart Cath and Coronary Angiography;  Surgeon: Jettie Booze, MD;  Location: [facility];  Service: Cardiovascular;  Laterality: N/A;  . CARDIAC CATHETERIZATION N/A 05/18/2016   Procedure: Coronary Stent Intervention;  Surgeon: Jettie Booze, MD;  Location: [facility];  Service: Cardiovascular;  Laterality: N/A;  . CORONARY ANGIOPLASTY    . CORONARY ANGIOPLASTY WITH STENT PLACEMENT    . CORONARY STENT INTERVENTION N/A 09/07/2017   Procedure: CORONARY STENT INTERVENTION;  Surgeon: Leonie Man, MD;  Location: [facility];  Service: Cardiovascular;  Laterality: N/A;  . LEFT HEART CATH AND CORONARY ANGIOGRAPHY N/A 09/07/2017   Procedure: LEFT HEART CATH AND CORONARY ANGIOGRAPHY;  Surgeon: Leonie Man, MD;  Location: [facility];  Service: Cardiovascular;  Laterality: N/A;  . TUBAL LIGATION  1980  . VAGINAL HYSTERECTOMY  1999   "partial; fibroids"    FAMILY HISTORY Family History  Problem Relation Age of Onset  . Diabetes Mother   . Hypertension Mother   . COPD Mother   . Heart failure Mother   . Hypertension Sister   . Hypertension Brother     SOCIAL HISTORY Social History   Tobacco Use  . Smoking status: Never Smoker  . Smokeless tobacco: Never Used  Substance Use Topics  . Alcohol use: No    Alcohol/week: 0.0 oz  . Drug use: No         OPHTHALMIC EXAM:   Not recorded      IMAGING AND PROCEDURES  Imaging and Procedures for 11/18/17           ASSESSMENT/PLAN:    ICD-10-CM   1. Proliferative diabetic retinopathy of both eyes with macular edema associated with type 2 diabetes mellitus ([place]) U76.5465   2. Retinal edema H35.81   3. Vitreous hemorrhage of left eye (HCC) H43.12   4. Essential hypertension I10   5. Hypertensive retinopathy of both eyes H35.033   6. Combined forms of age-related cataract of both eyes H25.813     1,2. Proliferative diabetic retinopathy with diabetic macular edema, OU - The incidence, risk factors for progression, natural history and treatment options for diabetic retinopathy   were discussed with patient.   - The need for close monitoring of blood glucose, blood pressure, and serum lipids, avoiding cigarette or any type of tobacco, and the need for long term follow up was also discussed with patient. - exam shows significant IRH, macular edema, fine NVE OU; preretinal and vitreous heme OS - FA shows NVE OU - OCT confirms diabetic macular edema, OU - s/p IVA #1 OS (03.13.19)  - s/p IVA #1 OD (03.15.19)  - s/p PRP OS (04.05.19) - today, on exam, VH slightly more diffuse OS - recommend *** today (***) - pt wishes to proceed - RBA of procedure discussed, questions answered - informed consent obtained and signed - see procedure note - f/u April 12th for DFE/OCT, likely injection  3. Vitreous  hemorrhage OS - secondary to #1 above - s/p IVA OS on 3.13.19 - s/p PRP OS (04.05.19) - pt states floaters are already improving - VH precautions reviewed -- minimize activities, keep head elevated, avoid ASA/NSAIDs/blood thinners as able - of note pt on warfarin  4,5. Hypertensive retinopathy OU - discussed importance of tight BP control - monitor  6. 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 - discussed diagnosis and progression - not yet visually significant - monitor for now   Ophthalmic Meds Ordered this visit:  No orders of the defined types were placed in this encounter.      No follow-ups on file.  There are no Patient Instructions on file for this visit.   Explained the diagnoses, plan, and follow up with the patient and they expressed understanding.  Patient expressed understanding of the importance of proper follow up care.   This document serves as a record of services personally performed by Gardiner Sleeper, MD, PhD. It was created on their behalf by Catha Brow, [place], a certified ophthalmic assistant. The creation of this record is the provider's  dictation and/or activities during the visit.  Electronically signed by: Catha Brow, COA  11/18/17 8:16 AM   Gardiner Sleeper, M.D., Ph.D. Diseases & Surgery of the Retina and [place] 11/18/17    Abbreviations: M myopia (nearsighted); A astigmatism; H hyperopia (farsighted); P presbyopia; Mrx spectacle prescription;  CTL contact lenses; OD right eye; OS left eye; OU both eyes  XT exotropia; ET esotropia; PEK punctate epithelial keratitis; PEE punctate epithelial erosions; DES dry eye syndrome; MGD meibomian gland dysfunction; ATs artificial tears; PFAT's preservative free artificial tears; [place] nuclear sclerotic cataract; PSC posterior subcapsular cataract; ERM epi-retinal membrane; PVD posterior vitreous detachment; RD retinal detachment; DM diabetes mellitus; DR diabetic retinopathy; NPDR non-proliferative diabetic retinopathy; PDR proliferative diabetic retinopathy; CSME clinically significant macular edema; DME diabetic macular edema; dbh dot blot hemorrhages; CWS cotton wool spot; POAG primary open angle glaucoma; C/D cup-to-disc ratio; HVF humphrey visual field; GVF goldmann visual field; OCT optical coherence tomography; IOP intraocular pressure; BRVO Branch retinal vein occlusion; CRVO central retinal vein occlusion; CRAO central retinal artery occlusion; BRAO branch retinal artery occlusion; RT retinal tear; SB scleral buckle; PPV pars plana vitrectomy; VH Vitreous hemorrhage; PRP panretinal laser photocoagulation; IVK intravitreal kenalog; VMT vitreomacular traction; MH Macular hole;  NVD neovascularization of the disc; NVE neovascularization elsewhere; AREDS age related eye disease study; ARMD age related macular degeneration; POAG primary open angle glaucoma; EBMD epithelial/anterior basement membrane dystrophy; ACIOL anterior chamber intraocular lens; IOL intraocular lens; PCIOL posterior chamber intraocular lens; Phaco/IOL phacoemulsification with  intraocular lens placement; [place] photorefractive keratectomy; LASIK laser assisted in situ keratomileusis; HTN hypertension; DM diabetes mellitus; COPD chronic obstructive pulmonary disease

## 2017-11-19 ENCOUNTER — Encounter (INDEPENDENT_AMBULATORY_CARE_PROVIDER_SITE_OTHER): Payer: BLUE CROSS/BLUE SHIELD | Admitting: Ophthalmology

## 2017-11-19 ENCOUNTER — Telehealth (HOSPITAL_COMMUNITY): Payer: Self-pay | Admitting: Emergency Medicine

## 2017-11-19 NOTE — Telephone Encounter (Signed)
Patient is already schedule with the coumadin clinic on 11/23/2017 and she is waiting to here back from cardiology to get her in for appointment

## 2017-11-19 NOTE — Telephone Encounter (Signed)
Rosemarie Ax,  I am actually no longer at [facility]. I will have my former assistant attempt to reach patient to schedule a cardiology and coumadin clinic appointment. I will also attempted to have her scheduled primary care appointment n the office.   Carroll Sage. [name], MSN, FNP-C [facility]  [name] [place], [place] [address] [phone]

## 2017-11-22 ENCOUNTER — Ambulatory Visit (INDEPENDENT_AMBULATORY_CARE_PROVIDER_SITE_OTHER): Payer: BLUE CROSS/BLUE SHIELD | Admitting: Ophthalmology

## 2017-11-22 ENCOUNTER — Ambulatory Visit (HOSPITAL_COMMUNITY): Payer: BLUE CROSS/BLUE SHIELD

## 2017-11-22 ENCOUNTER — Telehealth (HOSPITAL_COMMUNITY): Payer: Self-pay | Admitting: Family Medicine

## 2017-11-22 ENCOUNTER — Telehealth (HOSPITAL_COMMUNITY): Payer: Self-pay | Admitting: *Deleted

## 2017-11-22 ENCOUNTER — Encounter (HOSPITAL_COMMUNITY): Payer: Self-pay

## 2017-11-22 ENCOUNTER — Encounter (INDEPENDENT_AMBULATORY_CARE_PROVIDER_SITE_OTHER): Payer: Self-pay | Admitting: Ophthalmology

## 2017-11-22 ENCOUNTER — Encounter: Payer: Self-pay | Admitting: Cardiovascular Disease

## 2017-11-22 ENCOUNTER — Ambulatory Visit (INDEPENDENT_AMBULATORY_CARE_PROVIDER_SITE_OTHER): Payer: BLUE CROSS/BLUE SHIELD | Admitting: Cardiovascular Disease

## 2017-11-22 VITALS — BP 150/70 | HR 63 | Ht 66.0 in | Wt 174.6 lb

## 2017-11-22 DIAGNOSIS — I25118 Atherosclerotic heart disease of native coronary artery with other forms of angina pectoris: Secondary | ICD-10-CM | POA: Diagnosis not present

## 2017-11-22 DIAGNOSIS — I10 Essential (primary) hypertension: Secondary | ICD-10-CM

## 2017-11-22 DIAGNOSIS — H25813 Combined forms of age-related cataract, bilateral: Secondary | ICD-10-CM

## 2017-11-22 DIAGNOSIS — I6522 Occlusion and stenosis of left carotid artery: Secondary | ICD-10-CM | POA: Diagnosis not present

## 2017-11-22 DIAGNOSIS — Z79899 Other long term (current) drug therapy: Secondary | ICD-10-CM

## 2017-11-22 DIAGNOSIS — I5043 Acute on chronic combined systolic (congestive) and diastolic (congestive) heart failure: Secondary | ICD-10-CM | POA: Diagnosis not present

## 2017-11-22 DIAGNOSIS — I480 Paroxysmal atrial fibrillation: Secondary | ICD-10-CM | POA: Diagnosis not present

## 2017-11-22 DIAGNOSIS — D508 Other iron deficiency anemias: Secondary | ICD-10-CM

## 2017-11-22 DIAGNOSIS — H3581 Retinal edema: Secondary | ICD-10-CM

## 2017-11-22 DIAGNOSIS — H35033 Hypertensive retinopathy, bilateral: Secondary | ICD-10-CM

## 2017-11-22 DIAGNOSIS — Z955 Presence of coronary angioplasty implant and graft: Secondary | ICD-10-CM

## 2017-11-22 DIAGNOSIS — R0609 Other forms of dyspnea: Secondary | ICD-10-CM | POA: Diagnosis not present

## 2017-11-22 DIAGNOSIS — Z7901 Long term (current) use of anticoagulants: Secondary | ICD-10-CM | POA: Diagnosis not present

## 2017-11-22 DIAGNOSIS — E113513 Type 2 diabetes mellitus with proliferative diabetic retinopathy with macular edema, bilateral: Secondary | ICD-10-CM

## 2017-11-22 DIAGNOSIS — H4312 Vitreous hemorrhage, left eye: Secondary | ICD-10-CM

## 2017-11-22 MED ORDER — BEVACIZUMAB CHEMO INJECTION 1.25MG/0.05ML SYRINGE FOR KALEIDOSCOPE
1.2500 mg | INTRAVITREAL | Status: DC
  Administered 2017-11-22: 1.25 mg via INTRAVITREAL

## 2017-11-22 MED ORDER — FERROUS SULFATE 325 (65 FE) MG PO TABS: 325.0000 mg | ORAL_TABLET | ORAL | 3 refills | Status: DC

## 2017-11-22 MED ORDER — HYDRALAZINE HCL 50 MG PO TABS: 75.0000 mg | ORAL_TABLET | Freq: Three times a day (TID) | ORAL | 6 refills | Status: DC

## 2017-11-22 NOTE — Progress Notes (Signed)
SUBJECTIVE: The patient presents for routine follow-up. She underwent percutaneous coronary intervention with 2 drug-eluting stents placed to the proximal RCA on 09/07/17. There is a 40% proximal LAD stenosis and a previously placed mid LAD stent that was patent.  LVEDP was mildly elevated.  Echocardiogram 09/08/17 showed mild to moderately function, LVEF 40-45%, severe concentric LVH, multiple wall motion abnormalities, grade 3 diastolic dysfunction with elevated filling pressures, mild mitral regurgitation, pulmonary pressures 43 mmHg.  She moved from [place] to [place] in early 2018 but prefers to continue following up with me in [place].  She was evaluated in the ED for shortness of breath on 11/17/17. I have personally reviewed all documentation, labs, radiographic and cardiovascular studies, and independently interpreted all ECG's.  Blood pressure was 200/86.  Chest x-ray showed no acute cardiopulmonary process.  BNP was elevated to 960.  CBC showed anemia with hemoglobin 8.3 and MCV of 73.5.  She was given IV Lasix.  Fecal occult blood testing was negative.  She told me she underwent a colonoscopy in 2015 and 2 polyps were removed at that time.  She denies hematochezia and melena.  She has been taking ferrous sulfate one day on and 2 days off.  She denies exertional chest pain.  She also denies leg swelling.    Review of Systems: As per "subjective", otherwise negative.  Allergies  Allergen Reactions  . Ace Inhibitors Cough    Current Outpatient Medications  Medication Sig Dispense Refill  . acetaminophen (TYLENOL) 500 MG tablet Take 500 mg by mouth as needed for mild pain.    Marland Kitchen amLODipine (NORVASC) 10 MG tablet Take 1 tablet (10 mg total) by mouth daily. 90 tablet 2  . atorvastatin (LIPITOR) 40 MG tablet Take 1 tablet (40 mg total) by mouth daily at 6 PM. 90 tablet 1  . carvedilol (COREG) 25 MG tablet Take 2 tablets (50 mg total) by mouth 2 (two) times daily. 60 tablet  3  . ferrous sulfate (FERROUSUL) 325 (65 FE) MG tablet Take 1 tablet (325 mg total) by mouth 3 (three) times daily with meals. 90 tablet 3  . furosemide (LASIX) 40 MG tablet Take 1 tablet (40 mg total) by mouth daily. 90 tablet 3  . Glucose Blood (BLOOD GLUCOSE TEST STRIPS) STRP Use as directed 200 each 0  . hydrALAZINE (APRESOLINE) 50 MG tablet Take 1 tablet (50 mg total) by mouth 3 (three) times daily. 90 tablet 1  . insulin glargine (LANTUS) 100 UNIT/ML injection Inject 0.2 mLs (20 Units total) into the skin at bedtime. 20 mL 1  . insulin lispro (HUMALOG) 100 UNIT/ML injection Inject 0.05 mLs (5 Units total) into the skin 3 (three) times daily with meals. 30 mL 3  . Insulin Pen Needle 29G X 10MM MISC Use as directed 100 each 3  . INSULIN SYRINGE .5CC/29G 29G X 1/2" 0.5 ML MISC Use to administer insulin three times daily 200 each 5  . losartan (COZAAR) 50 MG tablet Take 1 tablet (50 mg total) by mouth 2 (two) times daily. 90 tablet 3  . nitroGLYCERIN (NITROSTAT) 0.4 MG SL tablet Place 1 tablet (0.4 mg total) under the tongue every 5 (five) minutes x 3 doses as needed for chest pain. 30 tablet 12  . potassium chloride SA (K-DUR,KLOR-CON) 10 MEQ tablet Take 1 tablet with each dose of furosemide, daily. 30 tablet 2  . ticagrelor (BRILINTA) 90 MG TABS tablet Take 1 tablet (90 mg total) by mouth 2 (two) times daily.  180 tablet 3  . warfarin (COUMADIN) 5 MG tablet Take 1 to 1.5 tablets by mouth daily as directed by coumadin clinic (Patient taking differently: Take 5-7.5 mg by mouth See admin instructions. Take 1 tablet (5 mg) on Sunday, Monday, Wednesday, Thursday, Friday, and Saturday then take 1 and 1/2 tablets (7.5mg ) on Tuesday) 120 tablet 1   Current Facility-Administered Medications  Medication Dose Route Frequency Provider Last Rate Last Dose  . Bevacizumab (AVASTIN) SOLN 1.25 mg  1.25 mg Intravitreal  Bernarda Caffey, MD   1.25 mg at 10/20/17 1158  . Bevacizumab (AVASTIN) SOLN 1.25 mg  1.25 mg  Intravitreal  Bernarda Caffey, MD   1.25 mg at 10/22/17 0827  . Bevacizumab (AVASTIN) SOLN 1.25 mg  1.25 mg Intravitreal  Bernarda Caffey, MD   1.25 mg at 11/22/17 1425  . Bevacizumab (AVASTIN) SOLN 1.25 mg  1.25 mg Intravitreal  Bernarda Caffey, MD   1.25 mg at 11/22/17 1425    Past Medical History:  Diagnosis Date  . Anemia   . CHF (congestive heart failure) ([place])   . CKD (chronic kidney disease), stage III ([facility])   . Coronary artery disease   . GERD (gastroesophageal reflux disease)   . Heart murmur   . Hyperlipidemia   . Hypertension   . Stroke ([facility]) 09/2014   numbness left upper lip, finger tips on left hand; "resolved" (05/18/2016)  . Type II diabetes mellitus ([facility])     Past Surgical History:  Procedure Laterality Date  . CARDIAC CATHETERIZATION N/A 05/18/2016   Procedure: Left Heart Cath and Coronary Angiography;  Surgeon: Jettie Booze, MD;  Location: [facility];  Service: Cardiovascular;  Laterality: N/A;  . CARDIAC CATHETERIZATION N/A 05/18/2016   Procedure: Coronary Stent Intervention;  Surgeon: Jettie Booze, MD;  Location: [facility];  Service: Cardiovascular;  Laterality: N/A;  . CORONARY ANGIOPLASTY    . CORONARY ANGIOPLASTY WITH STENT PLACEMENT    . CORONARY STENT INTERVENTION N/A 09/07/2017   Procedure: CORONARY STENT INTERVENTION;  Surgeon: Leonie Man, MD;  Location: [facility];  Service: Cardiovascular;  Laterality: N/A;  . LEFT HEART CATH AND CORONARY ANGIOGRAPHY N/A 09/07/2017   Procedure: LEFT HEART CATH AND CORONARY ANGIOGRAPHY;  Surgeon: Leonie Man, MD;  Location: [facility];  Service: Cardiovascular;  Laterality: N/A;  . TUBAL LIGATION  1980  . VAGINAL HYSTERECTOMY  1999   "partial; fibroids"    Social History   Socioeconomic History  . Marital status: Married    Spouse name: Not on file  . Number of children: Not on file  . Years of education: 37  . Highest education level: Not on file  Occupational  History  . Occupation: hairdresser  Social Needs  . Financial resource strain: Not on file  . Food insecurity:    Worry: Not on file    Inability: Not on file  . Transportation needs:    Medical: Not on file    Non-medical: Not on file  Tobacco Use  . Smoking status: Never Smoker  . Smokeless tobacco: Never Used  Substance and Sexual Activity  . Alcohol use: No    Alcohol/week: 0.0 oz  . Drug use: No  . Sexual activity: Yes  Lifestyle  . Physical activity:    Days per week: Not on file    Minutes per session: Not on file  . Stress: Not on file  Relationships  . Social connections:    Talks on phone:  Not on file    Gets together: Not on file    Attends religious service: Not on file    Active member of club or organization: Not on file    Attends meetings of clubs or organizations: Not on file    Relationship status: Not on file  . Intimate partner violence:    Fear of current or ex partner: Not on file    Emotionally abused: Not on file    Physically abused: Not on file    Forced sexual activity: Not on file  Other Topics Concern  . Not on file  Social History Narrative   Married, 2 children, hairdresser   Caffeine use- occasional tea or coffee   Right handed     Vitals:   11/22/17 1600  BP: (!) 150/70  Pulse: 63  SpO2: 98%  Weight: 174 lb 9.6 oz (79.2 kg)  Height: 5\' 6"  (1.676 m)    Wt Readings from Last 3 Encounters:  11/22/17 174 lb 9.6 oz (79.2 kg)  11/16/17 179 lb 3.7 oz (81.3 kg)  10/25/17 176 lb (79.8 kg)     PHYSICAL EXAM General: NAD HEENT: Normal. Neck: No JVD, no thyromegaly. Lungs: Bibasilar crackles, no wheezes. CV: Regular rate and rhythm, normal S1/S2, no S3/S4, no murmur. No pretibial or periankle edema.  Abdomen: Soft, nontender, no distention.  Neurologic: Alert and oriented.  Psych: Normal affect. Skin: Normal. Musculoskeletal: No gross deformities.    ECG: Most recent ECG reviewed.   Labs: Lab Results  Component Value  Date/Time   K 3.5 11/17/2017 08:33 AM   BUN 16 11/17/2017 08:33 AM   BUN 17 09/22/2017 09:58 AM   CREATININE 1.43 (H) 11/17/2017 08:33 AM   CREATININE 1.33 (H) 07/09/2017 10:47 AM   ALT 12 09/22/2017 09:58 AM   TSH 1.34 01/05/2017 11:46 AM   HGB 8.3 (L) 11/17/2017 08:33 AM   HGB 9.4 (L) 09/22/2017 09:58 AM     Lipids: Lab Results  Component Value Date/Time   LDLCALC 95 01/05/2017 11:46 AM   CHOL 190 01/05/2017 11:46 AM   TRIG 74 01/05/2017 11:46 AM   HDL 80 01/05/2017 11:46 AM       ASSESSMENT AND PLAN: 1.  Acute on chronic combined systolic and diastolic heart failure, LVEF 40-45% with grade 3 diastolic dysfunction with exertional dyspnea:  BNP elevated to 960 as noted above.  I will increase Lasix to 40 mg twice daily for 3 days and double up on her potassium during that time.  I will obtain a follow-up basic metabolic panel in 1 week.  Continue carvedilol and losartan.  I will also aim to control blood pressure by increasing hydralazine to 75 mg 3 times daily.  2. Coronary artery disease with LAD stent and recent drug-eluting stent placement x2 to the ostial and proximal RCA: Symptomatically stable.  Currently on carvedilol, Brilinta, and Lipitor.  No ASA as she is also on warfarin.  3. Malignant hypertension:  This remains elevated.  I will increase hydralazine to 75 mg 3 times daily.  4. Carotid artery disease:Carotid Dopplers in 12/2015 showed moderate left-sided plaque disease with no hemodynamically significant stenosis.  Continue antiplatelet and statin therapy.  I will repeat Dopplers at the time of her next visit.  5. Paroxysmal atrial fibrillation and atrial tachycardia: Currently on carvedilol. Anticoagulated with warfarin.  No changes to therapy.  INR monitoring is performed in our [place] office.  6.  Iron deficiency anemia: Hemoglobin 8.3 and MCV 73.5 as detailed above.  She has been taking ferrous sulfate one day on and 2 days off.  I encouraged her to take  it every other day.  She denies hematochezia and melena and fecal occult blood testing was negative.  She will likely require a repeat EGD/colonoscopy to evaluate for occult GI blood loss.  I will also make a hematology referral in the event that she has malabsorption as in that case she would require parenteral therapy.     Disposition: Follow up 6 weeks  Time spent: 40 minutes, of which greater than 50% was spent reviewing symptoms, relevant blood tests and studies, and discussing management plan with the patient.    Kate Sable, M.D., F.A.C.C.

## 2017-11-22 NOTE — Patient Instructions (Addendum)
Your physician wants you to follow-up in: 6 weeks  with Dr.Koneswaran    INCREASE Hydralazine to 75 mg three times a day   INCREASE Iron to every other day   For the next 3 days, INCREASE your Lasix to 40 mg TWICE a day AND INCREASE your Potassium to 10 meq TWICE a day, then go back to usual dose    Get lab work :BMET in 1 week   You have been referred to hematology for your anemia.They will call you for an apt      No lab work or tests ordered today      Thank you for choosing [facility] !

## 2017-11-22 NOTE — Progress Notes (Signed)
[facility] Note  11/22/2017     CHIEF COMPLAINT Patient presents for Retina Follow Up   HISTORY OF PRESENT ILLNESS: Amber Young is a 62 y.o. female who presents to the clinic today for:   HPI    Retina Follow Up    Patient presents with  Diabetic Retinopathy.  In both eyes.  This started 2 months ago.  Severity is mild.  Since onset it is stable.  I, the attending physician,  performed the HPI with the patient and updated documentation appropriately.          Comments    F/U PDR W/DME OU. Patient states her" left eye Va is a little better, right eye VA is about the same". Pt reports her Bs have been fluctuating, this Am BS 357, yesterday (11/21/17) 198.Pt reports" she has noticed more flashing lights OS than before". Pt is ready to have tx today if indicated.       Last edited by Bernarda Caffey, MD on 11/22/2017  1:52 PM. (History)    Pt states she feels OU VA is improving; Pt states she is able to see a little more OS;   Referring physician: Scot Jun, [place], [place] [address]  HISTORICAL INFORMATION:   Selected notes from the MEDICAL RECORD NUMBER Referred by Molli Barrows, FNP for DM exam;  LEE-  Ocular Hx-  PMH- DM (A1C - 9.4 (02.13.19)), CKD, HTN, CAD, CHF, hx of stroke    CURRENT MEDICATIONS: No current outpatient medications on file. (Ophthalmic Drugs)   No current facility-administered medications for this visit.  (Ophthalmic Drugs)   Current Outpatient Medications (Other)  Medication Sig  . acetaminophen (TYLENOL) 500 MG tablet Take 500 mg by mouth as needed for mild pain.  Marland Kitchen amLODipine (NORVASC) 10 MG tablet Take 1 tablet (10 mg total) by mouth daily.  Marland Kitchen atorvastatin (LIPITOR) 40 MG tablet Take 1 tablet (40 mg total) by mouth daily at 6 PM.  . carvedilol (COREG) 25 MG tablet Take 2 tablets (50 mg total) by mouth 2 (two) times daily.  . ferrous sulfate (FERROUSUL) 325 (65 FE) MG tablet Take 1 tablet  (325 mg total) by mouth 3 (three) times daily with meals.  . furosemide (LASIX) 40 MG tablet Take 1 tablet (40 mg total) by mouth daily.  . Glucose Blood (BLOOD GLUCOSE TEST STRIPS) STRP Use as directed  . hydrALAZINE (APRESOLINE) 50 MG tablet Take 1 tablet (50 mg total) by mouth 3 (three) times daily.  . insulin glargine (LANTUS) 100 UNIT/ML injection Inject 0.2 mLs (20 Units total) into the skin at bedtime.  . insulin lispro (HUMALOG) 100 UNIT/ML injection Inject 0.05 mLs (5 Units total) into the skin 3 (three) times daily with meals.  . Insulin Pen Needle 29G X 10MM MISC Use as directed  . INSULIN SYRINGE .5CC/29G 29G X 1/2" 0.5 ML MISC Use to administer insulin three times daily  . losartan (COZAAR) 50 MG tablet Take 1 tablet (50 mg total) by mouth 2 (two) times daily.  . nitroGLYCERIN (NITROSTAT) 0.4 MG SL tablet Place 1 tablet (0.4 mg total) under the tongue every 5 (five) minutes x 3 doses as needed for chest pain.  . potassium chloride SA (K-DUR,KLOR-CON) 10 MEQ tablet Take 1 tablet with each dose of furosemide, daily.  . ticagrelor (BRILINTA) 90 MG TABS tablet Take 1 tablet (90 mg total) by mouth 2 (two) times daily.  Marland Kitchen warfarin (COUMADIN) 5 MG tablet  Take 1 to 1.5 tablets by mouth daily as directed by coumadin clinic (Patient taking differently: Take 5-7.5 mg by mouth See admin instructions. Take 1 tablet (5 mg) on Sunday, Monday, Wednesday, Thursday, Friday, and Saturday then take 1 and 1/2 tablets (7.5mg ) on Tuesday)   Current Facility-Administered Medications (Other)  Medication Route  . Bevacizumab (AVASTIN) SOLN 1.25 mg Intravitreal  . Bevacizumab (AVASTIN) SOLN 1.25 mg Intravitreal  . Bevacizumab (AVASTIN) SOLN 1.25 mg Intravitreal  . Bevacizumab (AVASTIN) SOLN 1.25 mg Intravitreal      REVIEW OF SYSTEMS: ROS    Positive for: Endocrine, Eyes   Negative for: Constitutional, Gastrointestinal, Neurological, Skin, Genitourinary, Musculoskeletal, HENT, Cardiovascular,  Respiratory, Psychiatric, Allergic/Imm, Heme/Lymph   Last edited by Zenovia Jordan, LPN on 2/84/1324  4:01 PM. (History)       ALLERGIES Allergies  Allergen Reactions  . Ace Inhibitors Cough    PAST MEDICAL HISTORY Past Medical History:  Diagnosis Date  . Anemia   . CHF (congestive heart failure) ([place])   . CKD (chronic kidney disease), stage III ([place])   . Coronary artery disease   . GERD (gastroesophageal reflux disease)   . Heart murmur   . Hyperlipidemia   . Hypertension   . Stroke ([place]) 09/2014   numbness left upper lip, finger tips on left hand; "resolved" (05/18/2016)  . Type II diabetes mellitus ([place])    Past Surgical History:  Procedure Laterality Date  . CARDIAC CATHETERIZATION N/A 05/18/2016   Procedure: Left Heart Cath and Coronary Angiography;  Surgeon: Jettie Booze, MD;  Location: [facility];  Service: Cardiovascular;  Laterality: N/A;  . CARDIAC CATHETERIZATION N/A 05/18/2016   Procedure: Coronary Stent Intervention;  Surgeon: Jettie Booze, MD;  Location: [facility];  Service: Cardiovascular;  Laterality: N/A;  . CORONARY ANGIOPLASTY    . CORONARY ANGIOPLASTY WITH STENT PLACEMENT    . CORONARY STENT INTERVENTION N/A 09/07/2017   Procedure: CORONARY STENT INTERVENTION;  Surgeon: Leonie Man, MD;  Location: [facility];  Service: Cardiovascular;  Laterality: N/A;  . LEFT HEART CATH AND CORONARY ANGIOGRAPHY N/A 09/07/2017   Procedure: LEFT HEART CATH AND CORONARY ANGIOGRAPHY;  Surgeon: Leonie Man, MD;  Location: [facility];  Service: Cardiovascular;  Laterality: N/A;  . TUBAL LIGATION  1980  . VAGINAL HYSTERECTOMY  1999   "partial; fibroids"    FAMILY HISTORY Family History  Problem Relation Age of Onset  . Diabetes Mother   . Hypertension Mother   . COPD Mother   . Heart failure Mother   . Hypertension Sister   . Hypertension Brother     SOCIAL HISTORY Social History   Tobacco Use  . Smoking  status: Never Smoker  . Smokeless tobacco: Never Used  Substance Use Topics  . Alcohol use: No    Alcohol/week: 0.0 oz  . Drug use: No         OPHTHALMIC EXAM:  Base Eye Exam    Visual Acuity (Snellen - Linear)      Right Left   Dist cc 20/70 -2 20/150 -2   Dist ph cc 20/70 -2 20/150 -2   Correction:  Glasses       Tonometry (Tonopen, 1:28 PM)      Right Left   Pressure 11 8       Pupils      Dark Light Shape React APD   Right 3 2 Round Slow None   Left 3 2 Round Slow  None       Visual Fields (Counting fingers)      Left Right    Full Full       Extraocular Movement      Right Left    Full, Ortho Full, Ortho       Neuro/Psych    Oriented x3:  Yes   Mood/Affect:  Normal       Dilation    Both eyes:  1.0% Mydriacyl, 2.5% Phenylephrine @ 1:28 PM        Slit Lamp and Fundus Exam    Slit Lamp Exam      Right Left   Lids/Lashes Dermatochalasis - upper lid, mild Meibomian gland dysfunction Dermatochalasis - upper lid, Meibomian gland dysfunction   Conjunctiva/Sclera White and quiet White and quiet   Cornea Arcus, 1+ Punctate epithelial erosions, 1+ Guttata Arcus, 2+ Punctate epithelial erosions, EBMD, trace Guttata   Anterior Chamber Deep and quiet Deep and quiet   Iris Round and dilated, No NVI Round and dilated, No NVI, persistent pupillary membrane from 0703 to 0130   Lens 2+ Nuclear sclerosis, 2+ Cortical cataract, Vacuoles, iridescent refractile opacities 2+ Nuclear sclerosis, 2+ Cortical cataract, Vacuoles, iridescent refractile opacities   Vitreous Vitreous syneresis, mild Asteroid hyalosis superiorly +RBC/diffuse VH - improving, mild Asteroid hyalosis superior vitreous       Fundus Exam      Right Left   Disc compact, Pink and Sharp hazy view, ?NVD at 0800 off inferior disc, Pink and Sharp   C/D Ratio 0.5 0.5   Macula Blunted foveal reflex, +edema improving, +DBH, +superior exudate, Microaneurysms, Intraretinal hemorrhage hazy view -- central  concentration of VH   Vessels Tortuous, ? Early fine NVE +NVE w/ large preretinal heme inf nasal to disc, scattered fibrosis   Periphery Attached, 360 DBH red VH clotting and settling inferiorly, concentration of VH over macula fibrosis nasal to disc, PRP scars nasal to disc          IMAGING AND PROCEDURES  Imaging and Procedures for 11/22/17  OCT, Retina - OU - Both Eyes       Right Eye Quality was borderline. Central Foveal Thickness: 595. Progression has improved. Findings include abnormal foveal contour, intraretinal fluid, subretinal fluid, vitreomacular adhesion .   Left Eye Quality was poor (No view).   Notes *Images captured and stored on drive  Diagnosis / Impression:  Central DME OU OD: mild interval improvement OS: no view  Clinical management:  See below  Abbreviations: NFP - Normal foveal profile. CME - cystoid macular edema. PED - pigment epithelial detachment. IRF - intraretinal fluid. SRF - subretinal fluid. EZ - ellipsoid zone. ERM - epiretinal membrane. ORA - outer retinal atrophy. ORT - outer retinal tubulation. SRHM - subretinal hyper-reflective material         Intravitreal Injection, Pharmacologic Agent - OD - Right Eye       Time Out 11/22/2017. 2:21 PM. Confirmed correct patient, procedure, site, and patient consented.   Anesthesia Topical anesthesia was used. Anesthetic medications included Lidocaine 2%, Tetracaine 0.5%.   Procedure Preparation included 5% betadine to ocular surface. A supplied needle was used.   Injection: 1.25 mg Bevacizumab 1.25mg /0.73ml   NDC: 30865-784-69    Lot: [id number]     Expiration Date: 12/13/2017   Route: Intravitreal   Site: Right Eye   Waste: 0 mg  Post-op Post injection exam found visual acuity of at least counting fingers. The patient tolerated the procedure well. There were no complications. The patient  received written and verbal post procedure care education.        Intravitreal Injection,  Pharmacologic Agent - OS - Left Eye       Time Out 11/22/2017. 2:19 PM. Confirmed correct patient, procedure, site, and patient consented.   Anesthesia Topical anesthesia was used. Anesthetic medications included Tetracaine 0.5%, Lidocaine 2%.   Procedure Preparation included 5% betadine to ocular surface, eyelid speculum. A supplied needle was used.   Injection: 1.25 mg Bevacizumab 1.25mg /0.13ml   NDC: 71062-694-85    Lot: [id number]     Expiration Date: 12/13/2017   Route: Intravitreal   Site: Left Eye   Waste: 0 mg  Post-op Post injection exam found visual acuity of at least counting fingers. The patient tolerated the procedure well. There were no complications. The patient received written and verbal post procedure care education.                 ASSESSMENT/PLAN:    ICD-10-CM   1. Proliferative diabetic retinopathy of both eyes with macular edema associated with type 2 diabetes mellitus (HCC) E11.3513 OCT, Retina - OU - Both Eyes    Intravitreal Injection, Pharmacologic Agent - OD - Right Eye    Intravitreal Injection, Pharmacologic Agent - OS - Left Eye    Bevacizumab (AVASTIN) SOLN 1.25 mg    Bevacizumab (AVASTIN) SOLN 1.25 mg  2. Retinal edema H35.81   3. Vitreous hemorrhage of left eye (HCC) H43.12   4. Essential hypertension I10   5. Hypertensive retinopathy of both eyes H35.033   6. Combined forms of age-related cataract of both eyes H25.813     1,2. Proliferative diabetic retinopathy with diabetic macular edema, OU - The incidence, risk factors for progression, natural history and treatment options for diabetic retinopathy  were discussed with patient.   - The need for close monitoring of blood glucose, blood pressure, and serum lipids, avoiding cigarette or any type of tobacco, and the need for long term follow up was also discussed with patient. - exam shows significant IRH, macular edema, fine NVE OU; preretinal and vitreous heme OS -- slowly improving  OU - FA from prior shows NVE OU - OCT confirms diabetic macular edema, OU -- DME improving OD, poor OCT image OS - s/p IVA #1 OS (03.13.19)  - s/p IVA #1 OD (03.15.19)  - s/p PRP OS (04.05.19) - VA improving OU - today, on exam, VH clearing settling inferiorly OS, but still present - recommend IVA OU today (04.15.19) - pt wishes to proceed - RBA of procedure discussed, questions answered - informed consent obtained and signed - see procedure note - f/u 2 weeks for DFE/OCT -- possible PRP fill in OS  3. Vitreous hemorrhage OS - secondary to #1, above - s/p IVA OS on 3.13.19 - s/p PRP OS (04.05.19) - pt states floaters are already improving - VH precautions reviewed -- minimize activities, keep head elevated, avoid ASA/NSAIDs/blood thinners as able - of note pt on warfarin - continue to monitor closely - will try to fill in with PRP OS as able - discussed possibility of surgery OS if VH does not clear  4,5. Hypertensive retinopathy OU - discussed importance of tight BP control - stable - monitor  6. 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 - discussed diagnosis and progression - not yet visually significant - monitor for now   Ophthalmic Meds Ordered this visit:  Meds ordered this  encounter  Medications  . Bevacizumab (AVASTIN) SOLN 1.25 mg  . Bevacizumab (AVASTIN) SOLN 1.25 mg       Return in about 2 weeks (around 12/06/2017) for F/U PDR w/ DME OU, Dilated exam, OCT.  There are no Patient Instructions on file for this visit.   Explained the diagnoses, plan, and follow up with the patient and they expressed understanding.  Patient expressed understanding of the importance of proper follow up care.   This document serves as a record of services personally performed by Gardiner Sleeper, MD, PhD. It was created on their behalf by Catha Brow, Katherine, a certified  ophthalmic assistant. The creation of this record is the provider's dictation and/or activities during the visit.  Electronically signed by: Catha Brow, COA  11/22/17 3:24 PM   Gardiner Sleeper, M.D., Ph.D. Diseases & Surgery of the Retina and [place] 11/22/17  I have reviewed the above documentation for accuracy and completeness, and I agree with the above. Gardiner Sleeper, M.D., Ph.D. 11/22/17 3:27 PM    Abbreviations: M myopia (nearsighted); A astigmatism; H hyperopia (farsighted); P presbyopia; Mrx spectacle prescription;  CTL contact lenses; OD right eye; OS left eye; OU both eyes  XT exotropia; ET esotropia; PEK punctate epithelial keratitis; PEE punctate epithelial erosions; DES dry eye syndrome; MGD meibomian gland dysfunction; ATs artificial tears; PFAT's preservative free artificial tears; [place] nuclear sclerotic cataract; PSC posterior subcapsular cataract; ERM epi-retinal membrane; PVD posterior vitreous detachment; RD retinal detachment; DM diabetes mellitus; DR diabetic retinopathy; NPDR non-proliferative diabetic retinopathy; PDR proliferative diabetic retinopathy; CSME clinically significant macular edema; DME diabetic macular edema; dbh dot blot hemorrhages; CWS cotton wool spot; POAG primary open angle glaucoma; C/D cup-to-disc ratio; HVF humphrey visual field; GVF goldmann visual field; OCT optical coherence tomography; IOP intraocular pressure; BRVO Branch retinal vein occlusion; CRVO central retinal vein occlusion; CRAO central retinal artery occlusion; BRAO branch retinal artery occlusion; RT retinal tear; SB scleral buckle; PPV pars plana vitrectomy; VH Vitreous hemorrhage; PRP panretinal laser photocoagulation; IVK intravitreal kenalog; VMT vitreomacular traction; MH Macular hole;  NVD neovascularization of the disc; NVE neovascularization elsewhere; AREDS age related eye disease study; ARMD age related macular degeneration; POAG primary  open angle glaucoma; EBMD epithelial/anterior basement membrane dystrophy; ACIOL anterior chamber intraocular lens; IOL intraocular lens; PCIOL posterior chamber intraocular lens; Phaco/IOL phacoemulsification with intraocular lens placement; [place] photorefractive keratectomy; LASIK laser assisted in situ keratomileusis; HTN hypertension; DM diabetes mellitus; COPD chronic obstructive pulmonary disease

## 2017-11-23 ENCOUNTER — Telehealth (HOSPITAL_COMMUNITY): Payer: Self-pay | Admitting: *Deleted

## 2017-11-23 ENCOUNTER — Ambulatory Visit (INDEPENDENT_AMBULATORY_CARE_PROVIDER_SITE_OTHER): Payer: BLUE CROSS/BLUE SHIELD | Admitting: Pharmacist Clinician (PhC)/ Clinical Pharmacy Specialist

## 2017-11-23 DIAGNOSIS — Z5181 Encounter for therapeutic drug level monitoring: Secondary | ICD-10-CM | POA: Diagnosis not present

## 2017-11-23 DIAGNOSIS — I480 Paroxysmal atrial fibrillation: Secondary | ICD-10-CM | POA: Diagnosis not present

## 2017-11-23 DIAGNOSIS — Z8673 Personal history of transient ischemic attack (TIA), and cerebral infarction without residual deficits: Secondary | ICD-10-CM

## 2017-11-23 LAB — POCT INR: INR: 2.1

## 2017-11-23 NOTE — Patient Instructions (Signed)
Description   Continue with 1 tablet daily except 1.5 tablets each Tuesday. Repeat INR in 3 weeks.

## 2017-11-24 ENCOUNTER — Ambulatory Visit (HOSPITAL_COMMUNITY): Payer: BLUE CROSS/BLUE SHIELD

## 2017-11-24 ENCOUNTER — Encounter (HOSPITAL_COMMUNITY): Payer: Self-pay | Admitting: Emergency Medicine

## 2017-11-25 ENCOUNTER — Telehealth: Payer: Self-pay | Admitting: Hematology and Oncology

## 2017-11-25 NOTE — Telephone Encounter (Signed)
Left message for patient regarding appointment Date/Time/location & phone number

## 2017-11-26 ENCOUNTER — Ambulatory Visit (HOSPITAL_COMMUNITY): Payer: BLUE CROSS/BLUE SHIELD

## 2017-11-29 ENCOUNTER — Ambulatory Visit (HOSPITAL_COMMUNITY): Payer: BLUE CROSS/BLUE SHIELD

## 2017-12-01 ENCOUNTER — Ambulatory Visit (HOSPITAL_COMMUNITY): Payer: BLUE CROSS/BLUE SHIELD

## 2017-12-03 ENCOUNTER — Ambulatory Visit (HOSPITAL_COMMUNITY): Payer: BLUE CROSS/BLUE SHIELD

## 2017-12-06 ENCOUNTER — Ambulatory Visit: Payer: BLUE CROSS/BLUE SHIELD | Admitting: Family Medicine

## 2017-12-06 ENCOUNTER — Ambulatory Visit (HOSPITAL_COMMUNITY): Payer: BLUE CROSS/BLUE SHIELD

## 2017-12-06 NOTE — Progress Notes (Signed)
[facility] Note  12/07/2017     CHIEF COMPLAINT Patient presents for Retina Follow Up   HISTORY OF PRESENT ILLNESS: Amber Young is a 62 y.o. female who presents to the clinic today for:   HPI    Retina Follow Up    Patient presents with  Diabetic Retinopathy.  In both eyes.  Severity is moderate.  Duration of 2 weeks.  Since onset it is gradually improving.  I, the attending physician,  performed the HPI with the patient and updated documentation appropriately.          Comments    F/U PDR with DME OU; Pt states she feels OU VA is "better"; Pt states she has floaters and flashes occasionally; Pt states last CBG was 172, pt reports she is still having trouble with keeping CBG regulated; Pt reports she tolerated laser procedure last visit; Pt reports after last visit she went to her cardiologist and states cardiologist told her "my hemoglobin was low", states she has an appointment with "hemoglobin specialist" next week;        Last edited by Bernarda Caffey, MD on 12/07/2017  9:20 AM. (History)    Pt states she feels OU VA is improving; Pt states she is able to see a little more OS; Pt reports CBG has been in the 300-400 level; Pt states CBG has not been stable; Pt reports cardiologist has upped insulin and HTN medications;   Referring physician: Scot Jun, [place], [place] [address]  HISTORICAL INFORMATION:   Selected notes from the MEDICAL RECORD NUMBER Referred by Molli Barrows, FNP for DM exam;  LEE-  Ocular Hx-  PMH- DM (A1C - 9.4 (02.13.19)), CKD, HTN, CAD, CHF, hx of stroke    CURRENT MEDICATIONS: No current outpatient medications on file. (Ophthalmic Drugs)   No current facility-administered medications for this visit.  (Ophthalmic Drugs)   Current Outpatient Medications (Other)  Medication Sig  . acetaminophen (TYLENOL) 500 MG tablet Take 500 mg by mouth as needed for mild pain.  Marland Kitchen amLODipine (NORVASC) 10  MG tablet Take 1 tablet (10 mg total) by mouth daily.  Marland Kitchen atorvastatin (LIPITOR) 40 MG tablet Take 1 tablet (40 mg total) by mouth daily at 6 PM.  . carvedilol (COREG) 25 MG tablet Take 2 tablets (50 mg total) by mouth 2 (two) times daily.  . ferrous sulfate (FERROUSUL) 325 (65 FE) MG tablet Take 1 tablet (325 mg total) by mouth every other day.  . furosemide (LASIX) 40 MG tablet Take 1 tablet (40 mg total) by mouth daily.  . Glucose Blood (BLOOD GLUCOSE TEST STRIPS) STRP Use as directed  . hydrALAZINE (APRESOLINE) 50 MG tablet Take 1.5 tablets (75 mg total) by mouth 3 (three) times daily.  . insulin glargine (LANTUS) 100 UNIT/ML injection Inject 0.2 mLs (20 Units total) into the skin at bedtime.  . insulin lispro (HUMALOG) 100 UNIT/ML injection Inject 0.05 mLs (5 Units total) into the skin 3 (three) times daily with meals.  . Insulin Pen Needle 29G X 10MM MISC Use as directed  . INSULIN SYRINGE .5CC/29G 29G X 1/2" 0.5 ML MISC Use to administer insulin three times daily  . losartan (COZAAR) 50 MG tablet Take 1 tablet (50 mg total) by mouth 2 (two) times daily.  . nitroGLYCERIN (NITROSTAT) 0.4 MG SL tablet Place 1 tablet (0.4 mg total) under the tongue every 5 (five) minutes x 3 doses as needed for chest pain.  Marland Kitchen  potassium chloride SA (K-DUR,KLOR-CON) 10 MEQ tablet Take 1 tablet with each dose of furosemide, daily.  . ticagrelor (BRILINTA) 90 MG TABS tablet Take 1 tablet (90 mg total) by mouth 2 (two) times daily.  Marland Kitchen warfarin (COUMADIN) 5 MG tablet Take 1 to 1.5 tablets by mouth daily as directed by coumadin clinic (Patient taking differently: Take 5-7.5 mg by mouth See admin instructions. Take 1 tablet (5 mg) on Sunday, Monday, Wednesday, Thursday, Friday, and Saturday then take 1 and 1/2 tablets (7.5mg ) on Tuesday)   Current Facility-Administered Medications (Other)  Medication Route  . Bevacizumab (AVASTIN) SOLN 1.25 mg Intravitreal  . Bevacizumab (AVASTIN) SOLN 1.25 mg Intravitreal  .  Bevacizumab (AVASTIN) SOLN 1.25 mg Intravitreal  . Bevacizumab (AVASTIN) SOLN 1.25 mg Intravitreal      REVIEW OF SYSTEMS: ROS    Positive for: Endocrine, Eyes   Negative for: Constitutional, Gastrointestinal, Neurological, Skin, Genitourinary, Musculoskeletal, HENT, Cardiovascular, Respiratory, Psychiatric, Allergic/Imm, Heme/Lymph   Last edited by Cherrie Gauze, COA on 12/07/2017  8:49 AM. (History)       ALLERGIES Allergies  Allergen Reactions  . Ace Inhibitors Cough    PAST MEDICAL HISTORY Past Medical History:  Diagnosis Date  . Anemia   . CHF (congestive heart failure) ([place])   . CKD (chronic kidney disease), stage III ([place])   . Coronary artery disease   . GERD (gastroesophageal reflux disease)   . Heart murmur   . Hyperlipidemia   . Hypertension   . Stroke ([place]) 09/2014   numbness left upper lip, finger tips on left hand; "resolved" (05/18/2016)  . Type II diabetes mellitus ([place])    Past Surgical History:  Procedure Laterality Date  . CARDIAC CATHETERIZATION N/A 05/18/2016   Procedure: Left Heart Cath and Coronary Angiography;  Surgeon: Jettie Booze, MD;  Location: [facility];  Service: Cardiovascular;  Laterality: N/A;  . CARDIAC CATHETERIZATION N/A 05/18/2016   Procedure: Coronary Stent Intervention;  Surgeon: Jettie Booze, MD;  Location: [facility];  Service: Cardiovascular;  Laterality: N/A;  . CORONARY ANGIOPLASTY    . CORONARY ANGIOPLASTY WITH STENT PLACEMENT    . CORONARY STENT INTERVENTION N/A 09/07/2017   Procedure: CORONARY STENT INTERVENTION;  Surgeon: Leonie Man, MD;  Location: [facility];  Service: Cardiovascular;  Laterality: N/A;  . LEFT HEART CATH AND CORONARY ANGIOGRAPHY N/A 09/07/2017   Procedure: LEFT HEART CATH AND CORONARY ANGIOGRAPHY;  Surgeon: Leonie Man, MD;  Location: [facility];  Service: Cardiovascular;  Laterality: N/A;  . TUBAL LIGATION  1980  . VAGINAL HYSTERECTOMY  1999    "partial; fibroids"    FAMILY HISTORY Family History  Problem Relation Age of Onset  . Diabetes Mother   . Hypertension Mother   . COPD Mother   . Heart failure Mother   . Hypertension Sister   . Hypertension Brother     SOCIAL HISTORY Social History   Tobacco Use  . Smoking status: Never Smoker  . Smokeless tobacco: Never Used  Substance Use Topics  . Alcohol use: No    Alcohol/week: 0.0 oz  . Drug use: No         OPHTHALMIC EXAM:  Base Eye Exam    Visual Acuity (Snellen - Linear)      Right Left   Dist cc 20/70 20/150   Dist ph cc 20/70 (NI) 20/150 (NI)   Correction:  Glasses       Tonometry (Tonopen, 8:58 AM)  Right Left   Pressure 14 13       Pupils      Dark Light Shape React APD   Right 4 3 Round Sluggish None   Left 4 3 Round Sluggish None       Visual Fields (Counting fingers)      Left Right    Full Full       Extraocular Movement      Right Left    Full, Ortho Full, Ortho       Neuro/Psych    Oriented x3:  Yes   Mood/Affect:  Normal       Dilation    Both eyes:  1.0% Mydriacyl, 2.5% Phenylephrine @ 8:58 AM        Slit Lamp and Fundus Exam    Slit Lamp Exam      Right Left   Lids/Lashes Dermatochalasis - upper lid, mild Meibomian gland dysfunction Dermatochalasis - upper lid, Meibomian gland dysfunction   Conjunctiva/Sclera White and quiet White and quiet   Cornea Arcus, 1+ Punctate epithelial erosions, 1+ Guttata Arcus, 2+ Punctate epithelial erosions, EBMD, trace Guttata   Anterior Chamber Deep and quiet Deep and quiet   Iris Round and dilated, No NVI Round and dilated, No NVI, persistent pupillary membrane from 0703 to 0130   Lens 2-3+ Nuclear sclerosis, 2+ Cortical cataract, Vacuoles, iridescent refractile opacities 2-3+ Nuclear sclerosis, 2+ Cortical cataract, Vacuoles, iridescent refractile opacities   Vitreous Vitreous syneresis, mild Asteroid hyalosis superiorly +RBC/diffuse VH - settling inferiorly, mild Asteroid  hyalosis superior vitreous       Fundus Exam      Right Left   Disc compact, Pink and Sharp hazy view, perivascular fibrosis at 0800 with regressing NVD, fibrosis overlying disc, Pink and Sharp   C/D Ratio 0.5 0.5   Macula Blunted foveal reflex, +edema, +DBH, +superior exudate, Microaneurysms, Intraretinal hemorrhage Slightly hazy view -- central concentration of VH, Exudates visable superotemporal to fovea   Vessels Tortuous, ? Early fine NVE +NVE w/ large preretinal heme inf nasal to disc, scattered fibrosis   Periphery Attached, 360 DBH red VH clotting and settling inferiorly, concentration of VH over macula fibrosis nasal to disc, PRP scars nasal to disc, view clear enough now for superior and temporal laser          IMAGING AND PROCEDURES  Imaging and Procedures for 12/07/17  OCT, Retina - OU - Both Eyes       Right Eye Quality was borderline. Central Foveal Thickness: 630. Progression has worsened. Findings include abnormal foveal contour, intraretinal fluid, subretinal fluid, vitreomacular adhesion .   Left Eye Quality was borderline ( ). Central Foveal Thickness: 382. Progression has no prior data. Findings include intraretinal fluid, no SRF, vitreous traction (Vitreous traction on wide field OCT).   Notes *Images captured and stored on drive  Diagnosis / Impression:  Central DME OU OD: slight interval worsening OS: severe DME and focal vitreous traction temporal   Clinical management:  See below  Abbreviations: NFP - Normal foveal profile. CME - cystoid macular edema. PED - pigment epithelial detachment. IRF - intraretinal fluid. SRF - subretinal fluid. EZ - ellipsoid zone. ERM - epiretinal membrane. ORA - outer retinal atrophy. ORT - outer retinal tubulation. SRHM - subretinal hyper-reflective material         Panretinal Photocoagulation - OS - Left Eye       LASER PROCEDURE NOTE  Diagnosis:   Proliferative Diabetic Retinopathy, LEFT EYE  Procedure:   Pan-retinal photocoagulation  using slit lamp laser, fill in, LEFT EYE  Anesthesia:  Topical  Surgeon: Bernarda Caffey, MD, PhD  Informed consent obtained, operative eye marked, and time out performed prior to initiation of laser.   L[employer]is [id number] slit lamp laser Pattern: 2x2 square Power: 280 mW Duration: 50 msec  Spot size: 200 microns  # spots: 653 spots fill-in superior, temporal and nasal  Complications: None.  Notes: significant vitreous heme obscuring view and preventing laser up take inferiorly and scattered focal areas  RTC: 2 wks  Patient tolerated the procedure well and received written and verbal post-procedure care information/education.                ASSESSMENT/PLAN:    ICD-10-CM   1. Proliferative diabetic retinopathy of both eyes with macular edema associated with type 2 diabetes mellitus (HCC) E11.3513 OCT, Retina - OU - Both Eyes    Panretinal Photocoagulation - OS - Left Eye  2. Retinal edema H35.81   3. Vitreous hemorrhage of left eye (HCC) H43.12 Panretinal Photocoagulation - OS - Left Eye  4. Essential hypertension I10   5. Hypertensive retinopathy of both eyes H35.033   6. Combined forms of age-related cataract of both eyes H25.813     1,2. Proliferative diabetic retinopathy with diabetic macular edema, OU - exam shows significant IRH, macular edema, fine NVE OU; preretinal and vitreous heme OS -- slowly improving OU - FA from prior shows NVE OU - OCT confirms diabetic macular edema, OU -- DME with minimal improvement - s/p IVA #1 OS (03.13.19), #2 (04.15.19)  - s/p IVA #1 OD (03.15.19), #2 (04.15.19) - s/p PRP OS (04.05.19) - VA stable today OU - today, on exam, VH clearing settling inferiorly OS, but still present - recommend PRP fill-in OS today (04.30.19) as VH and view improved - pt wishes to proceed - RBA of procedure discussed, questions answered - informed consent obtained and signed - see procedure note - f/u 2 weeks for  DFE/OCT  3. Vitreous hemorrhage OS - secondary to #1, above - s/p IVA OS on 3.13.19 - s/p PRP OS (04.05.19) - pt states floaters are improving - VH precautions reviewed -- minimize activities, keep head elevated, avoid ASA/NSAIDs/blood thinners as able - of note pt on warfarin - continue to monitor closely - will continue to fill in with PRP OS as able - discussed possibility of surgery OS if VH does not clear  4,5. Hypertensive retinopathy OU - discussed importance of tight BP control - stable - monitor  6. 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 - discussed diagnosis and progression - not yet visually significant - monitor for now   Ophthalmic Meds Ordered this visit:  No orders of the defined types were placed in this encounter.      Return in about 2 weeks (around 12/21/2017).  There are no Patient Instructions on file for this visit.   Explained the diagnoses, plan, and follow up with the patient and they expressed understanding.  Patient expressed understanding of the importance of proper follow up care.     Gardiner Sleeper, M.D., Ph.D. Diseases & Surgery of the Retina and [place] 12/07/17  I have reviewed the above documentation for accuracy and completeness, and I agree with the above. Gardiner Sleeper, M.D., Ph.D. 12/07/17 3:40 PM     Abbreviations: M myopia (nearsighted); A astigmatism; H hyperopia (farsighted); P  presbyopia; Mrx spectacle prescription;  CTL contact lenses; OD right eye; OS left eye; OU both eyes  XT exotropia; ET esotropia; PEK punctate epithelial keratitis; PEE punctate epithelial erosions; DES dry eye syndrome; MGD meibomian gland dysfunction; ATs artificial tears; PFAT's preservative free artificial tears; [place] nuclear sclerotic cataract; PSC posterior subcapsular cataract; ERM epi-retinal membrane; PVD  posterior vitreous detachment; RD retinal detachment; DM diabetes mellitus; DR diabetic retinopathy; NPDR non-proliferative diabetic retinopathy; PDR proliferative diabetic retinopathy; CSME clinically significant macular edema; DME diabetic macular edema; dbh dot blot hemorrhages; CWS cotton wool spot; POAG primary open angle glaucoma; C/D cup-to-disc ratio; HVF humphrey visual field; GVF goldmann visual field; OCT optical coherence tomography; IOP intraocular pressure; BRVO Branch retinal vein occlusion; CRVO central retinal vein occlusion; CRAO central retinal artery occlusion; BRAO branch retinal artery occlusion; RT retinal tear; SB scleral buckle; PPV pars plana vitrectomy; VH Vitreous hemorrhage; PRP panretinal laser photocoagulation; IVK intravitreal kenalog; VMT vitreomacular traction; MH Macular hole;  NVD neovascularization of the disc; NVE neovascularization elsewhere; AREDS age related eye disease study; ARMD age related macular degeneration; POAG primary open angle glaucoma; EBMD epithelial/anterior basement membrane dystrophy; ACIOL anterior chamber intraocular lens; IOL intraocular lens; PCIOL posterior chamber intraocular lens; Phaco/IOL phacoemulsification with intraocular lens placement; [place] photorefractive keratectomy; LASIK laser assisted in situ keratomileusis; HTN hypertension; DM diabetes mellitus; COPD chronic obstructive pulmonary disease

## 2017-12-07 ENCOUNTER — Ambulatory Visit (INDEPENDENT_AMBULATORY_CARE_PROVIDER_SITE_OTHER): Payer: BLUE CROSS/BLUE SHIELD | Admitting: Ophthalmology

## 2017-12-07 ENCOUNTER — Encounter (INDEPENDENT_AMBULATORY_CARE_PROVIDER_SITE_OTHER): Payer: Self-pay | Admitting: Ophthalmology

## 2017-12-07 DIAGNOSIS — H35033 Hypertensive retinopathy, bilateral: Secondary | ICD-10-CM | POA: Diagnosis not present

## 2017-12-07 DIAGNOSIS — H3581 Retinal edema: Secondary | ICD-10-CM

## 2017-12-07 DIAGNOSIS — H4312 Vitreous hemorrhage, left eye: Secondary | ICD-10-CM

## 2017-12-07 DIAGNOSIS — H25813 Combined forms of age-related cataract, bilateral: Secondary | ICD-10-CM

## 2017-12-07 DIAGNOSIS — E113513 Type 2 diabetes mellitus with proliferative diabetic retinopathy with macular edema, bilateral: Secondary | ICD-10-CM

## 2017-12-07 DIAGNOSIS — I10 Essential (primary) hypertension: Secondary | ICD-10-CM

## 2017-12-07 NOTE — Progress Notes (Signed)
This document serves as a record of services personally performed by Gardiner Sleeper, MD, PhD. It was created on their behalf by Catha Brow, [place], a certified ophthalmic assistant. The creation of this record is the provider's dictation and/or activities during the visit.  Electronically signed by: Catha Brow, [place]  12/07/17 3:40 PM  I have reviewed the above documentation for accuracy and completeness, and I agree with the above. Gardiner Sleeper, M.D., Ph.D. 12/07/17 3:43 PM

## 2017-12-08 ENCOUNTER — Ambulatory Visit (HOSPITAL_COMMUNITY): Payer: BLUE CROSS/BLUE SHIELD

## 2017-12-10 ENCOUNTER — Ambulatory Visit (HOSPITAL_COMMUNITY): Payer: BLUE CROSS/BLUE SHIELD

## 2017-12-13 ENCOUNTER — Inpatient Hospital Stay: Payer: BLUE CROSS/BLUE SHIELD | Attending: Hematology and Oncology | Admitting: Hematology and Oncology

## 2017-12-13 ENCOUNTER — Ambulatory Visit (HOSPITAL_COMMUNITY): Payer: BLUE CROSS/BLUE SHIELD

## 2017-12-13 ENCOUNTER — Telehealth: Payer: Self-pay

## 2017-12-13 ENCOUNTER — Encounter: Payer: Self-pay | Admitting: Hematology and Oncology

## 2017-12-13 ENCOUNTER — Inpatient Hospital Stay: Payer: BLUE CROSS/BLUE SHIELD

## 2017-12-13 VITALS — BP 180/83 | HR 83 | Temp 98.6°F | Resp 17 | Ht 66.0 in | Wt 176.7 lb

## 2017-12-13 DIAGNOSIS — N183 Chronic kidney disease, stage 3 (moderate): Secondary | ICD-10-CM | POA: Diagnosis not present

## 2017-12-13 DIAGNOSIS — E1122 Type 2 diabetes mellitus with diabetic chronic kidney disease: Secondary | ICD-10-CM | POA: Diagnosis not present

## 2017-12-13 DIAGNOSIS — I2510 Atherosclerotic heart disease of native coronary artery without angina pectoris: Secondary | ICD-10-CM | POA: Insufficient documentation

## 2017-12-13 DIAGNOSIS — Z794 Long term (current) use of insulin: Secondary | ICD-10-CM

## 2017-12-13 DIAGNOSIS — Z7901 Long term (current) use of anticoagulants: Secondary | ICD-10-CM | POA: Insufficient documentation

## 2017-12-13 DIAGNOSIS — D509 Iron deficiency anemia, unspecified: Secondary | ICD-10-CM

## 2017-12-13 DIAGNOSIS — I130 Hypertensive heart and chronic kidney disease with heart failure and stage 1 through stage 4 chronic kidney disease, or unspecified chronic kidney disease: Secondary | ICD-10-CM | POA: Diagnosis not present

## 2017-12-13 DIAGNOSIS — I129 Hypertensive chronic kidney disease with stage 1 through stage 4 chronic kidney disease, or unspecified chronic kidney disease: Secondary | ICD-10-CM

## 2017-12-13 DIAGNOSIS — Z129 Encounter for screening for malignant neoplasm, site unspecified: Secondary | ICD-10-CM

## 2017-12-13 DIAGNOSIS — E11319 Type 2 diabetes mellitus with unspecified diabetic retinopathy without macular edema: Secondary | ICD-10-CM | POA: Diagnosis not present

## 2017-12-13 DIAGNOSIS — I480 Paroxysmal atrial fibrillation: Secondary | ICD-10-CM | POA: Diagnosis not present

## 2017-12-13 DIAGNOSIS — Z79899 Other long term (current) drug therapy: Secondary | ICD-10-CM | POA: Diagnosis not present

## 2017-12-13 LAB — CMP (CANCER CENTER ONLY)
ALBUMIN: 2.6 g/dL — AB (ref 3.5–5.0)
ALT: 9 U/L (ref 0–55)
ANION GAP: 6 (ref 3–11)
AST: 15 U/L (ref 5–34)
Alkaline Phosphatase: 113 U/L (ref 40–150)
BUN: 33 mg/dL — AB (ref 7–26)
CO2: 25 mmol/L (ref 22–29)
Calcium: 8.9 mg/dL (ref 8.4–10.4)
Chloride: 108 mmol/L (ref 98–109)
Creatinine: 1.71 mg/dL — ABNORMAL HIGH (ref 0.60–1.10)
GFR, Est AFR Am: 36 mL/min — ABNORMAL LOW (ref >60)
GFR, Estimated: 31 mL/min — ABNORMAL LOW (ref >60)
GLUCOSE: 307 mg/dL — AB (ref 70–140)
POTASSIUM: 4.2 mmol/L (ref 3.5–5.1)
Sodium: 139 mmol/L (ref 136–145)
Total Bilirubin: 0.2 mg/dL (ref 0.2–1.2)
Total Protein: 7.1 g/dL (ref 6.4–8.3)

## 2017-12-13 LAB — CBC WITH DIFFERENTIAL (CANCER CENTER ONLY)
Basophils Absolute: 0 10*3/uL (ref 0.0–0.1)
Basophils Relative: 0 %
EOS PCT: 2 %
Eosinophils Absolute: 0.1 10*3/uL (ref 0.0–0.5)
HEMATOCRIT: 26.6 % — AB (ref 34.8–46.6)
Hemoglobin: 8.4 g/dL — ABNORMAL LOW (ref 11.6–15.9)
LYMPHS PCT: 30 %
Lymphs Abs: 1.5 10*3/uL (ref 0.9–3.3)
MCH: 23.6 pg — AB (ref 25.1–34.0)
MCHC: 31.6 g/dL (ref 31.5–36.0)
MCV: 74.7 fL — AB (ref 79.5–101.0)
MONO ABS: 0.4 10*3/uL (ref 0.1–0.9)
Monocytes Relative: 9 %
NEUTROS ABS: 2.9 10*3/uL (ref 1.5–6.5)
Neutrophils Relative %: 59 %
PLATELETS: 270 10*3/uL (ref 145–400)
RBC: 3.56 MIL/uL — AB (ref 3.70–5.45)
RDW: 16 % — AB (ref 11.2–14.5)
WBC Count: 4.9 10*3/uL (ref 3.9–10.3)

## 2017-12-13 LAB — LACTATE DEHYDROGENASE: LDH: 309 U/L — AB (ref 125–245)

## 2017-12-13 LAB — FERRITIN: FERRITIN: 77 ng/mL (ref 9–269)

## 2017-12-13 LAB — IRON AND TIBC
IRON: 38 ug/dL — AB (ref 41–142)
Saturation Ratios: 15 % — ABNORMAL LOW (ref 21–57)
TIBC: 259 ug/dL (ref 236–444)
UIBC: 220 ug/dL

## 2017-12-13 LAB — TRANSFERRIN: TRANSFERRIN: 203 mg/dL (ref 192–382)

## 2017-12-13 NOTE — Telephone Encounter (Signed)
Per 5/6 no ref for mammagram and LBGI is internal.

## 2017-12-13 NOTE — Assessment & Plan Note (Signed)
62 y.o. female with progressive anemia of at least 3-year duration with chronic microcytosis and hypochromia.  Findings would suggest underlying iron deficiency, but iron panel obtained in October 2018 does not fully support the diagnosis based on ferritin of over 90.  Differential includes anemia of chronic disease, versus presence of underlying hemoglobinopathy such as sickle cell trait or thalassemia.  Plan: -Labs as outlined below. -Patient instructed to increase oral iron intake to 3 times daily as tolerated -Return to our clinic in 1 week to review the laboratory results. -Additionally, patient is out of date for her cancer screening including mammogram and colonoscopy.  Both of those will be ordered

## 2017-12-13 NOTE — Progress Notes (Signed)
[facility] New Visit:  Assessment: Microcytic anemia 62 y.o. female with progressive anemia of at least 3-year duration with chronic microcytosis and hypochromia.  Findings would suggest underlying iron deficiency, but iron panel obtained in October 2018 does not fully support the diagnosis based on ferritin of over 90.  Differential includes anemia of chronic disease, versus presence of underlying hemoglobinopathy such as sickle cell trait or thalassemia.  Plan: -Labs as outlined below. -Patient instructed to increase oral iron intake to 3 times daily as tolerated -Return to our clinic in 1 week to review the laboratory results. -Additionally, patient is out of date for her cancer screening including mammogram and colonoscopy.  Both of those will be ordered   Voice recognition software was used and creation of this note. Despite my best effort at editing the text, some misspelling/errors may have occurred. Orders Placed This Encounter  Procedures  . MM Digital Screening    Standing Status:   Future    Standing Expiration Date:   12/13/2018    Order Specific Question:   Reason for Exam (SYMPTOM  OR DIAGNOSIS REQUIRED)    Answer:   Screening mammogram -- patient has missed the eval last year    Order Specific Question:   Preferred imaging location?    Answer:   [facility]  . CBC with Differential (Cancer Center Only)    Standing Status:   Future    Number of Occurrences:   1    Standing Expiration Date:   12/14/2018  . Soluble transferrin receptor  . Transferrin  . Hemoglobinopathy evaluation  . CMP ([facility] only)    Standing Status:   Future    Number of Occurrences:   1    Standing Expiration Date:   12/14/2018  . Lactate dehydrogenase (LDH)    Standing Status:   Future    Number of Occurrences:   1    Standing Expiration Date:   12/13/2018  . Iron and TIBC    Standing Status:   Future    Number of Occurrences:   1    Standing Expiration Date:    12/14/2018  . Ferritin    Standing Status:   Future    Number of Occurrences:   1    Standing Expiration Date:   12/14/2018  . Copper, serum    Standing Status:   Future    Number of Occurrences:   1    Standing Expiration Date:   12/14/2018  . Ceruloplasmin    Standing Status:   Future    Number of Occurrences:   1    Standing Expiration Date:   12/14/2018  . Ambulatory referral to Gastroenterology    Referral Priority:   Routine    Referral Type:   Consultation    Referral Reason:   Specialty Services Required    Number of Visits Requested:   1    All questions were answered.  . The patient knows to call the clinic with any problems, questions or concerns.  This note was electronically signed.    History of Presenting Illness Amber Young 62 y.o. presenting to the [facility] for evaluation of iron deficiency anemia, referred by Dr. Herminio Commons.  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  Additionally, patient has paroxysmal atrial fibrillation, hypertension, coronary artery disease with history of coronary stenting in January 2018  and 2 additional stents placed in January 2019, history of total abdominal hysterectomy due to bleeding fibroids.  Patient is presently on warfarin, Brilinta, and has been taking ferrous sulfate 325 mg every other day for the past 3 weeks.  Patient denies any family history of sickle cell disease, anemia, or malignancy.  At the present time, patient denies any significant active complaints other than fatigue with energy level at 50% of desired.  Denies fevers, chills, night sweats.  No active shortness of breath, chest pain, or cough.  Patient denies epistaxis, hemoptysis, hematochezia, melena, vaginal bleeding, or hematuria.  Oncological/hematological History: --Labs, 02/03/13: Hgb 14.3 --Labs, 10/02/14: Hgb  11.3, MCV 76.0, MCH 24.4, MCHC 32.1, RDW 13.3; --Labs, 12/05/15: Hgb 10.7, MCV 73.8, MCH 24.6, MCHC 33.3, RDW 13.3; --Labs, 12/02/16: Hgb 11.9, MCV 73.6, MCH 24.0, MCHC 32.8, RDW 15.3; --Labs, 02/23/17: Hgb   9.5, MCV 78.3, MCH 24.3, MCHC 31.0, RDW 16.5;  --Labs, 05/24/17: Hgb   8.6, MCV 76.3, MCH 24.2, MCHC 31.7, RDW 13.9; Fe 40, FeSat 13%, TIBC 308, Ferritin 92 --Labs, 09/06/17: Hgb   9.1, MCV 78.1, MCH 24.6, MCHC 31.5, RDW 14.9; --Labs, 09/22/17: Hgb   9.4, MCV 78.0, MCH 24.3, MCHC 31.2, RDW 14.9; --Labs, 11/17/17: Hgb   8.3, MCV 73.5, MCH 23.2, MCHC 31.6, RDW 14.3;  Medical History: Past Medical History:  Diagnosis Date  . Anemia   . CHF (congestive heart failure) ([place])   . CKD (chronic kidney disease), stage III ([place])   . Coronary artery disease   . GERD (gastroesophageal reflux disease)   . Heart murmur   . Hyperlipidemia   . Hypertension   . Stroke ([place]) 09/2014   numbness left upper lip, finger tips on left hand; "resolved" (05/18/2016)  . Type II diabetes mellitus ([place])     Surgical History: Past Surgical History:  Procedure Laterality Date  . CARDIAC CATHETERIZATION N/A 05/18/2016   Procedure: Left Heart Cath and Coronary Angiography;  Surgeon: Jettie Booze, MD;  Location: [facility];  Service: Cardiovascular;  Laterality: N/A;  . CARDIAC CATHETERIZATION N/A 05/18/2016   Procedure: Coronary Stent Intervention;  Surgeon: Jettie Booze, MD;  Location: [facility];  Service: Cardiovascular;  Laterality: N/A;  . CORONARY ANGIOPLASTY    . CORONARY ANGIOPLASTY WITH STENT PLACEMENT    . CORONARY STENT INTERVENTION N/A 09/07/2017   Procedure: CORONARY STENT INTERVENTION;  Surgeon: Leonie Man, MD;  Location: [facility];  Service: Cardiovascular;  Laterality: N/A;  . LEFT HEART CATH AND CORONARY ANGIOGRAPHY N/A 09/07/2017   Procedure: LEFT HEART CATH AND CORONARY ANGIOGRAPHY;  Surgeon: Leonie Man, MD;  Location: [facility];   Service: Cardiovascular;  Laterality: N/A;  . TUBAL LIGATION  1980  . VAGINAL HYSTERECTOMY  1999   "partial; fibroids"    Family History: Family History  Problem Relation Age of Onset  . Diabetes Mother   . Hypertension Mother   . COPD Mother   . Heart failure Mother   . Hypertension Sister   . Hypertension Brother     Social History: Social History   Socioeconomic History  . Marital status: Married    Spouse name: Not on file  . Number of children: Not on file  . Years of education: 80  . Highest education level: Not on file  Occupational History  . Occupation: hairdresser  Social Needs  . Financial resource strain: Not on file  . Food insecurity:    Worry: Not on  file    Inability: Not on file  . Transportation needs:    Medical: Not on file    Non-medical: Not on file  Tobacco Use  . Smoking status: Never Smoker  . Smokeless tobacco: Never Used  Substance and Sexual Activity  . Alcohol use: No    Alcohol/week: 0.0 oz  . Drug use: No  . Sexual activity: Yes  Lifestyle  . Physical activity:    Days per week: Not on file    Minutes per session: Not on file  . Stress: Not on file  Relationships  . Social connections:    Talks on phone: Not on file    Gets together: Not on file    Attends religious service: Not on file    Active member of club or organization: Not on file    Attends meetings of clubs or organizations: Not on file    Relationship status: Not on file  . Intimate partner violence:    Fear of current or ex partner: Not on file    Emotionally abused: Not on file    Physically abused: Not on file    Forced sexual activity: Not on file  Other Topics Concern  . Not on file  Social History Narrative   Married, 2 children, hairdresser   Caffeine use- occasional tea or coffee   Right handed    Allergies: Allergies  Allergen Reactions  . Ace Inhibitors Cough    Medications:  Current Outpatient Medications  Medication Sig Dispense Refill   . amLODipine (NORVASC) 10 MG tablet Take 1 tablet (10 mg total) by mouth daily. 90 tablet 2  . atorvastatin (LIPITOR) 40 MG tablet Take 1 tablet (40 mg total) by mouth daily at 6 PM. 90 tablet 1  . carvedilol (COREG) 25 MG tablet Take 2 tablets (50 mg total) by mouth 2 (two) times daily. 60 tablet 3  . ferrous sulfate (FERROUSUL) 325 (65 FE) MG tablet Take 1 tablet (325 mg total) by mouth every other day. 90 tablet 3  . furosemide (LASIX) 40 MG tablet Take 1 tablet (40 mg total) by mouth daily. 90 tablet 3  . Glucose Blood (BLOOD GLUCOSE TEST STRIPS) STRP Use as directed 200 each 0  . hydrALAZINE (APRESOLINE) 50 MG tablet Take 1.5 tablets (75 mg total) by mouth 3 (three) times daily. 135 tablet 6  . insulin glargine (LANTUS) 100 UNIT/ML injection Inject 0.2 mLs (20 Units total) into the skin at bedtime. 20 mL 1  . insulin lispro (HUMALOG) 100 UNIT/ML injection Inject 0.05 mLs (5 Units total) into the skin 3 (three) times daily with meals. 30 mL 3  . Insulin Pen Needle 29G X 10MM MISC Use as directed 100 each 3  . INSULIN SYRINGE .5CC/29G 29G X 1/2" 0.5 ML MISC Use to administer insulin three times daily 200 each 5  . losartan (COZAAR) 50 MG tablet Take 1 tablet (50 mg total) by mouth 2 (two) times daily. 90 tablet 3  . nitroGLYCERIN (NITROSTAT) 0.4 MG SL tablet Place 1 tablet (0.4 mg total) under the tongue every 5 (five) minutes x 3 doses as needed for chest pain. 30 tablet 12  . potassium chloride SA (K-DUR,KLOR-CON) 10 MEQ tablet Take 1 tablet with each dose of furosemide, daily. 30 tablet 2  . ticagrelor (BRILINTA) 90 MG TABS tablet Take 1 tablet (90 mg total) by mouth 2 (two) times daily. 180 tablet 3  . warfarin (COUMADIN) 5 MG tablet Take 1 to 1.5 tablets by mouth  daily as directed by coumadin clinic (Patient taking differently: Take 5-7.5 mg by mouth See admin instructions. Take 1 tablet (5 mg) on Sunday, Monday, Wednesday, Thursday, Friday, and Saturday then take 1 and 1/2 tablets (7.64m)  on Tuesday) 120 tablet 1   Current Facility-Administered Medications  Medication Dose Route Frequency Provider Last Rate Last Dose  . Bevacizumab (AVASTIN) SOLN 1.25 mg  1.25 mg Intravitreal  ZBernarda Caffey MD   1.25 mg at 10/20/17 1158  . Bevacizumab (AVASTIN) SOLN 1.25 mg  1.25 mg Intravitreal  ZBernarda Caffey MD   1.25 mg at 10/22/17 0827  . Bevacizumab (AVASTIN) SOLN 1.25 mg  1.25 mg Intravitreal  ZBernarda Caffey MD   1.25 mg at 11/22/17 1425  . Bevacizumab (AVASTIN) SOLN 1.25 mg  1.25 mg Intravitreal  ZBernarda Caffey MD   1.25 mg at 11/22/17 1425    Review of Systems: Review of Systems  Constitutional: Positive for fatigue.  All other systems reviewed and are negative.    PHYSICAL EXAMINATION Blood pressure (!) 180/83, pulse 83, temperature 98.6 F (37 C), temperature source Oral, resp. rate 17, height '5\' 6"'  (1.676 m), weight 176 lb 11.2 oz (80.2 kg), SpO2 100 %.  ECOG PERFORMANCE STATUS: 1 - Symptomatic but completely ambulatory  Physical Exam  Constitutional: She is oriented to person, place, and time. She appears well-developed and well-nourished. No distress.  HENT:  Head: Normocephalic and atraumatic.  Mouth/Throat: Oropharynx is clear and moist. No oropharyngeal exudate.  Eyes: Pupils are equal, round, and reactive to light. Conjunctivae and EOM are normal. No scleral icterus.  Neck: No thyromegaly present.  Cardiovascular: Normal rate, regular rhythm, normal heart sounds and intact distal pulses. Exam reveals no gallop and no friction rub.  No murmur heard. Pulmonary/Chest: Effort normal and breath sounds normal. No stridor. No respiratory distress. She has no wheezes. She has no rales.  Abdominal: Soft. Bowel sounds are normal. She exhibits no distension and no mass. There is no tenderness. There is no guarding.  Musculoskeletal: She exhibits no edema.  Lymphadenopathy:    She has no cervical adenopathy.  Neurological: She is alert and oriented to person, place, and  time. She displays normal reflexes. No cranial nerve deficit or sensory deficit.  Skin: Skin is warm and dry. No rash noted. She is not diaphoretic. No erythema. No pallor.     LABORATORY DATA: I have personally reviewed the data as listed: Appointment on 12/13/2017  Component Date Value Ref Range Status  . Iron 12/13/2017 38* 41 - 142 ug/dL Final  . TIBC 12/13/2017 259  236 - 444 ug/dL Final  . Saturation Ratios 12/13/2017 15* 21 - 57 % Final  . UIBC 12/13/2017 220  ug/dL Final   [facility], [address], [place] [address] . Ferritin 12/13/2017 77  9 - 269 ng/mL Final   [facility]formed at [facility], [address], [place] [address] . LDH 12/13/2017 309* 125 - 245 U/L Final   [facility] at [facility], [address], [place] [address] . Sodium 12/13/2017 139  136 - 145 mmol/L Final  . Potassium 12/13/2017 4.2  3.5 - 5.1 mmol/L Final  . Chloride 12/13/2017 108  98 - 109 mmol/L Final  . CO2 12/13/2017 25  22 - 29 mmol/L Final  . Glucose, Bld 12/13/2017 307* 70 - 140 mg/dL Final  . BUN 12/13/2017 33* 7 - 26 mg/dL Final  .  Creatinine 12/13/2017 1.71* 0.60 - 1.10 mg/dL Final  . Calcium 12/13/2017 8.9  8.4 - 10.4 mg/dL Final  . Total Protein 12/13/2017 7.1  6.4 - 8.3 g/dL Final  . Albumin 12/13/2017 2.6* 3.5 - 5.0 g/dL Final  . AST 12/13/2017 15  5 - 34 U/L Final  . ALT 12/13/2017 9  0 - 55 U/L Final  . Alkaline Phosphatase 12/13/2017 113  40 - 150 U/L Final  . Total Bilirubin 12/13/2017 0.2  0.2 - 1.2 mg/dL Final  . GFR, Est Non Af Am 12/13/2017 31* >60 mL/min Final  . GFR, Est AFR Am 12/13/2017 36* >60 mL/min Final   Comment: (NOTE) The eGFR has been calculated using the CKD EPI equation. This calculation has not been validated in all clinical situations. eGFR's persistently <60 mL/min signify possible Chronic Kidney Disease.   Georgiann Hahn gap 12/13/2017 6  3 - 11  Final   Performed at [facility], [place] [address] [place], [place] [address]  . WBC Count 12/13/2017 4.9  3.9 - 10.3 K/uL Final  . RBC 12/13/2017 3.56* 3.70 - 5.45 MIL/uL Final  . Hemoglobin 12/13/2017 8.4* 11.6 - 15.9 g/dL Final  . HCT 12/13/2017 26.6* 34.8 - 46.6 % Final  . MCV 12/13/2017 74.7* 79.5 - 101.0 fL Final  . MCH 12/13/2017 23.6* 25.1 - 34.0 pg Final  . MCHC 12/13/2017 31.6  31.5 - 36.0 g/dL Final  . RDW 12/13/2017 16.0* 11.2 - 14.5 % Final  . Platelet Count 12/13/2017 270  145 - 400 K/uL Final  . Neutrophils Relative % 12/13/2017 59  % Final  . Neutro Abs 12/13/2017 2.9  1.5 - 6.5 K/uL Final  . Lymphocytes Relative 12/13/2017 30  % Final  . Lymphs Abs 12/13/2017 1.5  0.9 - 3.3 K/uL Final  . Monocytes Relative 12/13/2017 9  % Final  . Monocytes Absolute 12/13/2017 0.4  0.1 - 0.9 K/uL Final  . Eosinophils Relative 12/13/2017 2  % Final  . Eosinophils Absolute 12/13/2017 0.1  0.0 - 0.5 K/uL Final  . Basophils Relative 12/13/2017 0  % Final  . Basophils Absolute 12/13/2017 0.0  0.0 - 0.1 K/uL Final   Performed at [facility], [place] [address], [place], [place] [address]         Ardath Sax, MD

## 2017-12-14 ENCOUNTER — Ambulatory Visit (INDEPENDENT_AMBULATORY_CARE_PROVIDER_SITE_OTHER): Payer: BLUE CROSS/BLUE SHIELD | Admitting: Pharmacist

## 2017-12-14 DIAGNOSIS — I480 Paroxysmal atrial fibrillation: Secondary | ICD-10-CM | POA: Diagnosis not present

## 2017-12-14 DIAGNOSIS — Z5181 Encounter for therapeutic drug level monitoring: Secondary | ICD-10-CM

## 2017-12-14 LAB — POCT INR: INR: 1.7

## 2017-12-14 LAB — CERULOPLASMIN: Ceruloplasmin: 33 mg/dL (ref 19.0–39.0)

## 2017-12-14 LAB — SOLUBLE TRANSFERRIN RECEPTOR: TRANSFERRIN RECEPTOR: 20.7 nmol/L (ref 12.2–27.3)

## 2017-12-15 ENCOUNTER — Ambulatory Visit (INDEPENDENT_AMBULATORY_CARE_PROVIDER_SITE_OTHER): Payer: BLUE CROSS/BLUE SHIELD | Admitting: Family Medicine

## 2017-12-15 ENCOUNTER — Encounter: Payer: Self-pay | Admitting: Family Medicine

## 2017-12-15 ENCOUNTER — Ambulatory Visit (HOSPITAL_COMMUNITY): Payer: BLUE CROSS/BLUE SHIELD

## 2017-12-15 VITALS — BP 132/58 | HR 61 | Temp 98.6°F | Resp 16 | Ht 66.0 in | Wt 176.0 lb

## 2017-12-15 DIAGNOSIS — Z8673 Personal history of transient ischemic attack (TIA), and cerebral infarction without residual deficits: Secondary | ICD-10-CM | POA: Diagnosis not present

## 2017-12-15 DIAGNOSIS — N183 Chronic kidney disease, stage 3 unspecified: Secondary | ICD-10-CM

## 2017-12-15 DIAGNOSIS — Z7901 Long term (current) use of anticoagulants: Secondary | ICD-10-CM

## 2017-12-15 DIAGNOSIS — E1165 Type 2 diabetes mellitus with hyperglycemia: Secondary | ICD-10-CM | POA: Diagnosis not present

## 2017-12-15 DIAGNOSIS — I10 Essential (primary) hypertension: Secondary | ICD-10-CM | POA: Diagnosis not present

## 2017-12-15 LAB — POCT URINALYSIS DIPSTICK
BILIRUBIN UA: NEGATIVE
Glucose, UA: 250
KETONES UA: NEGATIVE
Leukocytes, UA: NEGATIVE
Nitrite, UA: NEGATIVE
PH UA: 5.5 (ref 5.0–8.0)
Protein, UA: >=300
Spec Grav, UA: 1.025 (ref 1.010–1.025)
UROBILINOGEN UA: 0.2 U/dL

## 2017-12-15 LAB — HEMOGLOBINOPATHY EVALUATION
HGB A: 97.9 % (ref 96.4–98.8)
HGB C: 0 %
HGB F QUANT: 0 % (ref 0.0–2.0)
HGB S QUANTITAION: 0 %
HGB VARIANT: 0 %
Hgb A2 Quant: 2.1 % (ref 1.8–3.2)

## 2017-12-15 LAB — COPPER, SERUM: Copper: 144 ug/dL (ref 72–166)

## 2017-12-15 LAB — GLUCOSE, POCT (MANUAL RESULT ENTRY): POC Glucose: 254 mg/dl — AB (ref 70–99)

## 2017-12-15 LAB — POCT GLYCOSYLATED HEMOGLOBIN (HGB A1C): Hemoglobin A1C: 10.6

## 2017-12-15 MED ORDER — FUROSEMIDE 40 MG PO TABS: 40.0000 mg | ORAL_TABLET | Freq: Every day | ORAL | 3 refills | Status: DC

## 2017-12-15 MED ORDER — CARVEDILOL 25 MG PO TABS: 50.0000 mg | ORAL_TABLET | Freq: Two times a day (BID) | ORAL | 3 refills | Status: DC

## 2017-12-15 MED ORDER — INSULIN GLARGINE 100 UNIT/ML ~~LOC~~ SOLN: 10.0000 [IU] | Freq: Two times a day (BID) | SUBCUTANEOUS | 11 refills | Status: DC

## 2017-12-15 NOTE — Patient Instructions (Signed)
Hemoglobin A1c is above goal, is currently 10.6.  Goal is less than 7.  Will change the way that you are currently taking your insulin.  Recommend that you split your Lantus dose.  Inject 10 units of Lantus in a.m. and 10 units at bedtime.  I recommend that you eat 5-6 small meals throughout the day and increase your daily water intake.  He will continue Humalog 5 units with meals.  Only take Humalog for meal coverage.  I will send a referral to diabetes education for further dietary management.  Your blood pressure is at goal on today, which is less than 140/90.  No medication changes warranted on today   Follow-up with hematologist and cardiologist as scheduled.    Please schedule your routine screening mammogram.  Also, a referral has been sent to gastroenterologist you should receive a call from them.

## 2017-12-15 NOTE — Progress Notes (Signed)
Subjective:    Patient ID: Amber Young, female    DOB: Dec 27, 1955, 62 y.o.   MRN: [id number]  HPI Amber Young, a 62 year old female with a history of hypertension, coronary artery disease, uncontrolled diabetes mellitus, stage III chronic kidney disease, and chronic anticoagulation therapy presents for a follow-up of diabetes.  Previous hemoglobin A1c was 15.4.  Patient states that she has been taking both Lantus and Humalog consistently.  Lantus was decreased to 10 units at bedtime due to increased somnolence and hypoglycemia.  Patient states that she has been checking blood sugars 4-5 times per day.  She did not bring glucometer to today's appointment. Patient is under the care of cardiology for coronary artery disease and uncontrolled hypertension.  Most recent ejection fraction is 40-45%.  Patient states that she was undergoing cardiac rehabilitation and was found to have a hemoglobin of 8.4.  She was referred to hematologist by cardiology.  A referral has been sent to gastroenterology for screening colonoscopy and for a routine mammogram. Patient endorses periodic fatigue.  She also endorses constipation due to iron supplements. Patient is also followed frequently by retina specialist. Patient is under the care of retinal specialist.  Patient has a history of diabetic retinopathy bilaterally with macular edema and is followed frequently.  She states that she has been advised to sleep sitting up due to  vitreous hemorrhage. She currently denies headache, fever, chest pains, shortness of breath, heart palpitation, dysuria, hematuria, polyuria, polydipsia, or polyphagia.  Past Medical History:  Diagnosis Date  . Anemia   . CHF (congestive heart failure) ([place])   . CKD (chronic kidney disease), stage III ([place])   . Coronary artery disease   . GERD (gastroesophageal reflux disease)   . Heart murmur   . Hyperlipidemia   . Hypertension   . Stroke ([place]) 09/2014   numbness left upper lip,  finger tips on left hand; "resolved" (05/18/2016)  . Type II diabetes mellitus ([facility])    Allergies  Allergen Reactions  . Ace Inhibitors Cough   Immunization History  Administered Date(s) Administered  . Tdap 01/05/2017   Social History   Socioeconomic History  . Marital status: Married    Spouse name: Not on file  . Number of children: Not on file  . Years of education: 23  . Highest education level: Not on file  Occupational History  . Occupation: hairdresser  Social Needs  . Financial resource strain: Not on file  . Food insecurity:    Worry: Not on file    Inability: Not on file  . Transportation needs:    Medical: Not on file    Non-medical: Not on file  Tobacco Use  . Smoking status: Never Smoker  . Smokeless tobacco: Never Used  Substance and Sexual Activity  . Alcohol use: No    Alcohol/week: 0.0 oz  . Drug use: No  . Sexual activity: Yes  Lifestyle  . Physical activity:    Days per week: Not on file    Minutes per session: Not on file  . Stress: Not on file  Relationships  . Social connections:    Talks on phone: Not on file    Gets together: Not on file    Attends religious service: Not on file    Active member of club or organization: Not on file    Attends meetings of clubs or organizations: Not on file    Relationship status: Not on file  . Intimate partner violence:  Fear of current or ex partner: Not on file    Emotionally abused: Not on file    Physically abused: Not on file    Forced sexual activity: Not on file  Other Topics Concern  . Not on file  Social History Narrative   Married, 2 children, hairdresser   Caffeine use- occasional tea or coffee   Right handed    Review of Systems  Constitutional: Negative.   HENT: Negative.   Eyes: Negative.  Negative for photophobia and redness.  Respiratory: Negative.  Negative for cough and shortness of breath.   Cardiovascular: Negative.   Gastrointestinal: Negative.   Endocrine: Positive  for cold intolerance and heat intolerance. Negative for polydipsia, polyphagia and polyuria.  Musculoskeletal: Negative.   Skin: Negative.  Negative for rash.  Allergic/Immunologic: Negative.  Negative for environmental allergies and food allergies.  Neurological: Negative.   Hematological: Negative.   Psychiatric/Behavioral: Negative.  Negative for behavioral problems and decreased concentration. The patient is not nervous/anxious.        Objective:   Physical Exam  Constitutional: She appears well-developed and well-nourished.  HENT:  Head: Normocephalic.  Right Ear: External ear normal.  Left Ear: External ear normal.  Cerumen impaction to left ear  Eyes: Pupils are equal, round, and reactive to light.  Neck: Normal range of motion.  Cardiovascular: Normal rate, regular rhythm and intact distal pulses.  Murmur heard. Trace edema and hyperpigmentation to bilateral lower extremities  Pulmonary/Chest: Effort normal and breath sounds normal.  Abdominal: Soft. Bowel sounds are normal.  Musculoskeletal: Normal range of motion.  Skin: Skin is warm and dry.  Psychiatric: She has a normal mood and affect. Her behavior is normal. Judgment and thought content normal.      BP (!) 132/58 (BP Location: Left Arm, Patient Position: Sitting, Cuff Size: Normal) Comment: manually  Pulse 61   Temp 98.6 F (37 C) (Oral)   Resp 16   Ht 5\' 6"  (1.676 m)   Wt 176 lb (79.8 kg)   SpO2 100%   BMI 28.41 kg/m  Assessment & Plan:  1. Uncontrolled hypertension Blood pressure is at goal on current medication regimen, no changes warranted on today. Reviewed urinalysis, no proteinuria present. Review renal functioning obtained on 12/13/2017, GFR 33 which is consistent with stage III chronic kidney disease.  We will continue to monitor no further work-up and evaluation warranted at this time. - Urinalysis Dipstick - furosemide (LASIX) 40 MG tablet; Take 1 tablet (40 mg total) by mouth daily.  Dispense: 90  tablet; Refill: 3 - carvedilol (COREG) 25 MG tablet; Take 2 tablets (50 mg total) by mouth 2 (two) times daily.  Dispense: 60 tablet; Refill: 3  2. Uncontrolled type 2 diabetes mellitus with hyperglycemia ([facility]) Hemoglobin A1c is 10.6, which is above goal. Will adjust Lantus to 10 units twice daily.  Recommend a 15 g carbohydrate modify snack at bedtime. Discussed diet at length, patient may benefit from a referral to diabetes education - HgB A1c - Glucose (CBG) - Ambulatory referral to diabetic education - insulin glargine (LANTUS) 100 UNIT/ML injection; Inject 0.1 mLs (10 Units total) into the skin 2 (two) times daily.  Dispense: 20 mL; Refill: 11  3. History of stroke Continue moderate dose statin and antiplatelet therapy  4. Chronic anticoagulation We will continue chronic anticoagulation therapy, patient is followed by Coumadin clinic  5. CKD (chronic kidney disease), stage III ([employer]) Review renal functioning, consistent with baseline, will continue to monitor closely. - Urinalysis Dipstick  RTC: 1 month for uncontrolled diabetes mellitus    Donia Pounds  MSN, FNP-C [facility] [address] [place], [place] [address] [phone]

## 2017-12-17 ENCOUNTER — Ambulatory Visit (HOSPITAL_COMMUNITY): Payer: BLUE CROSS/BLUE SHIELD

## 2017-12-17 NOTE — Telephone Encounter (Signed)
Opened by error.

## 2017-12-17 NOTE — Progress Notes (Addendum)
[facility] Note  12/20/2017     CHIEF COMPLAINT Patient presents for Retina Follow Up   HISTORY OF PRESENT ILLNESS: Amber Young is a 62 y.o. female who presents to the clinic today for:   HPI    Retina Follow Up    Patient presents with  Diabetic Retinopathy.  In both eyes.  Severity is moderate.  Duration of 2 weeks.  Since onset it is stable.  I, the attending physician,  performed the HPI with the patient and updated documentation appropriately.          Comments    Pt presents for PDR with macular edema F/U, pt states VA is stable, but some days seem better than others, pt states she has seen a few flashes and floaters OS, pt denies pain or wavy vision, pt did not check BS this AM, but it was 174 last night, pt states she is okay to receive another injection or laser treatment       Last edited by Cherrie Gauze, COA on 12/20/2017 11:22 AM. (History)    Pt states she feels VA is worse OS; Pt states she has been sleeping on her side;   Referring physician: Scot Jun, [place], [place] [id number]  HISTORICAL INFORMATION:   Selected notes from the MEDICAL RECORD NUMBER Referred by Molli Barrows, FNP for DM exam;  LEE-  Ocular Hx-  PMH- DM (A1C - 9.4 (02.13.19)), CKD, HTN, CAD, CHF, hx of stroke    CURRENT MEDICATIONS: No current outpatient medications on file. (Ophthalmic Drugs)   No current facility-administered medications for this visit.  (Ophthalmic Drugs)   Current Outpatient Medications (Other)  Medication Sig  . amLODipine (NORVASC) 10 MG tablet Take 1 tablet (10 mg total) by mouth daily.  Marland Kitchen atorvastatin (LIPITOR) 40 MG tablet Take 1 tablet (40 mg total) by mouth daily at 6 PM.  . carvedilol (COREG) 25 MG tablet Take 2 tablets (50 mg total) by mouth 2 (two) times daily.  . ferrous sulfate (FERROUSUL) 325 (65 FE) MG tablet Take 1 tablet (325 mg total) by mouth every other day.  . furosemide (LASIX)  40 MG tablet Take 1 tablet (40 mg total) by mouth daily.  . Glucose Blood (BLOOD GLUCOSE TEST STRIPS) STRP Use as directed  . hydrALAZINE (APRESOLINE) 50 MG tablet Take 1.5 tablets (75 mg total) by mouth 3 (three) times daily.  . insulin glargine (LANTUS) 100 UNIT/ML injection Inject 0.1 mLs (10 Units total) into the skin 2 (two) times daily.  . insulin lispro (HUMALOG) 100 UNIT/ML injection Inject 0.05 mLs (5 Units total) into the skin 3 (three) times daily with meals.  . Insulin Pen Needle 29G X 10MM MISC Use as directed  . INSULIN SYRINGE .5CC/29G 29G X 1/2" 0.5 ML MISC Use to administer insulin three times daily  . losartan (COZAAR) 50 MG tablet Take 1 tablet (50 mg total) by mouth 2 (two) times daily.  . nitroGLYCERIN (NITROSTAT) 0.4 MG SL tablet Place 1 tablet (0.4 mg total) under the tongue every 5 (five) minutes x 3 doses as needed for chest pain.  . potassium chloride SA (K-DUR,KLOR-CON) 10 MEQ tablet Take 1 tablet with each dose of furosemide, daily.  . ticagrelor (BRILINTA) 90 MG TABS tablet Take 1 tablet (90 mg total) by mouth 2 (two) times daily.  Marland Kitchen warfarin (COUMADIN) 5 MG tablet Take 1 to 1.5 tablets by mouth daily as directed by coumadin clinic (  Patient taking differently: Take 5-7.5 mg by mouth See admin instructions. Take 1 tablet (5 mg) on Sunday, Monday, Wednesday, Thursday, Friday, and Saturday then take 1 and 1/2 tablets (7.5mg ) on Tuesday)   Current Facility-Administered Medications (Other)  Medication Route  . Bevacizumab (AVASTIN) SOLN 1.25 mg Intravitreal  . Bevacizumab (AVASTIN) SOLN 1.25 mg Intravitreal  . Bevacizumab (AVASTIN) SOLN 1.25 mg Intravitreal  . Bevacizumab (AVASTIN) SOLN 1.25 mg Intravitreal  . Bevacizumab (AVASTIN) SOLN 1.25 mg Intravitreal  . Bevacizumab (AVASTIN) SOLN 1.25 mg Intravitreal      REVIEW OF SYSTEMS: ROS    Positive for: Endocrine, Cardiovascular, Eyes   Negative for: Constitutional, Gastrointestinal, Neurological, Skin,  Genitourinary, Musculoskeletal, HENT, Respiratory, Psychiatric, Allergic/Imm, Heme/Lymph   Last edited by Cherrie Gauze, COA on 12/20/2017 11:22 AM. (History)       ALLERGIES Allergies  Allergen Reactions  . Ace Inhibitors Cough    PAST MEDICAL HISTORY Past Medical History:  Diagnosis Date  . Anemia   . CHF (congestive heart failure) ([place])   . CKD (chronic kidney disease), stage III ([place])   . Coronary artery disease   . GERD (gastroesophageal reflux disease)   . Heart murmur   . Hyperlipidemia   . Hypertension   . Stroke ([place]) 09/2014   numbness left upper lip, finger tips on left hand; "resolved" (05/18/2016)  . Type II diabetes mellitus ([place])    Past Surgical History:  Procedure Laterality Date  . CARDIAC CATHETERIZATION N/A 05/18/2016   Procedure: Left Heart Cath and Coronary Angiography;  Surgeon: Jettie Booze, MD;  Location: [facility];  Service: Cardiovascular;  Laterality: N/A;  . CARDIAC CATHETERIZATION N/A 05/18/2016   Procedure: Coronary Stent Intervention;  Surgeon: Jettie Booze, MD;  Location: [facility];  Service: Cardiovascular;  Laterality: N/A;  . CORONARY ANGIOPLASTY    . CORONARY ANGIOPLASTY WITH STENT PLACEMENT    . CORONARY STENT INTERVENTION N/A 09/07/2017   Procedure: CORONARY STENT INTERVENTION;  Surgeon: Leonie Man, MD;  Location: [facility];  Service: Cardiovascular;  Laterality: N/A;  . EYE SURGERY    . LEFT HEART CATH AND CORONARY ANGIOGRAPHY N/A 09/07/2017   Procedure: LEFT HEART CATH AND CORONARY ANGIOGRAPHY;  Surgeon: Leonie Man, MD;  Location: [facility];  Service: Cardiovascular;  Laterality: N/A;  . TUBAL LIGATION  1980  . VAGINAL HYSTERECTOMY  1999   "partial; fibroids"    FAMILY HISTORY Family History  Problem Relation Age of Onset  . Diabetes Mother   . Hypertension Mother   . COPD Mother   . Heart failure Mother   . Hypertension Sister   . Hypertension Brother      SOCIAL HISTORY Social History   Tobacco Use  . Smoking status: Never Smoker  . Smokeless tobacco: Never Used  Substance Use Topics  . Alcohol use: No    Alcohol/week: 0.0 oz  . Drug use: No         OPHTHALMIC EXAM:  Base Eye Exam    Visual Acuity (Snellen - Linear)      Right Left   Dist cc 20/70 +2 CF at 2'   Dist ph cc 20/50 -2 NI       Tonometry (Tonopen, 8:40 AM)      Right Left   Pressure 15 12       Pupils      Dark Light Shape React APD   Right 4 2 Round Brisk None   Left  4 2 Round Brisk None       Visual Fields (Counting fingers)      Left Right    Full Full       Extraocular Movement      Right Left    Full, Ortho Full, Ortho       Neuro/Psych    Oriented x3:  Yes   Mood/Affect:  Normal       Dilation    Both eyes:  1.0% Mydriacyl, 2.5% Phenylephrine @ 8:40 AM        Slit Lamp and Fundus Exam    Slit Lamp Exam      Right Left   Lids/Lashes Dermatochalasis - upper lid, mild Meibomian gland dysfunction Dermatochalasis - upper lid, Meibomian gland dysfunction   Conjunctiva/Sclera White and quiet White and quiet   Cornea Arcus, 1+ Punctate epithelial erosions, 1+ Guttata Arcus, 2+ Punctate epithelial erosions, EBMD, trace Guttata   Anterior Chamber Deep and quiet Deep and quiet   Iris Round and dilated, No NVI Round and dilated, No NVI, persistent pupillary membrane from 0703 to 0130   Lens 2-3+ Nuclear sclerosis, 2+ Cortical cataract, Vacuoles, iridescent refractile opacities 2-3+ Nuclear sclerosis, 2+ Cortical cataract, Vacuoles, iridescent refractile opacities   Vitreous Vitreous syneresis, mild Asteroid hyalosis superiorly +RBC/diffuse VH - settling inferiorly, mild Asteroid hyalosis superior vitreous       Fundus Exam      Right Left   Disc compact, Pink and Sharp hazy view, perivascular fibrosis at 0800 with regressing NVD, fibrosis overlying disc, Pink and Sharp   C/D Ratio 0.5 0.5   Macula Blunted foveal reflex, +edema, +DBH,  +superior exudate, Microaneurysms, Intraretinal hemorrhage hazy view -- central concentration of VH, Exudates visible superotemporal to fovea   Vessels Tortuous, ? Early fine NVE +NVE w/ large preretinal heme inf nasal to disc, scattered fibrosis, +NVD   Periphery Attached, 360 DBH red VH clotting and settling inferiorly, concentration of VH over macula fibrosis nasal to disc, PRP scars nasal to disc, view clear enough now for superior and temporal laser          IMAGING AND PROCEDURES  Imaging and Procedures for 12/07/17  OCT, Retina - OU - Both Eyes       Right Eye Quality was borderline. Central Foveal Thickness: 627. Progression has been stable. Findings include abnormal foveal contour, intraretinal fluid, subretinal fluid, vitreomacular adhesion .   Left Eye Quality was poor. Central Foveal Thickness: (Unable to obtain). Progression has worsened. Findings include intraretinal fluid, no SRF, vitreous traction (Interval worsening of vitreous opacities, no details visable).   Notes *Images captured and stored on drive  Diagnosis / Impression:  Central DME OU OD: stable from prior OS: severe DME and focal vitreous traction temporal, interval worsening of vitreous opacities  Clinical management:  See below  Abbreviations: NFP - Normal foveal profile. CME - cystoid macular edema. PED - pigment epithelial detachment. IRF - intraretinal fluid. SRF - subretinal fluid. EZ - ellipsoid zone. ERM - epiretinal membrane. ORA - outer retinal atrophy. ORT - outer retinal tubulation. SRHM - subretinal hyper-reflective material         Intravitreal Injection, Pharmacologic Agent - OD - Right Eye       Time Out 12/20/2017. 9:52 AM. Confirmed correct patient, procedure, site, and patient consented.   Anesthesia Topical anesthesia was used. Anesthetic medications included Lidocaine 2%, Tetracaine 0.5%.   Procedure Preparation included 5% betadine to ocular surface. A supplied needle  was used.   Injection:  1.25 mg Bevacizumab 1.25mg /0.92ml   NDC: 20254-270-62    Lot: [id number]    Expiration Date: 02/02/2018   Route: Intravitreal   Site: Right Eye   Waste: 0 mg  Post-op Post injection exam found visual acuity of at least counting fingers. The patient tolerated the procedure well. There were no complications. The patient received written and verbal post procedure care education.        Intravitreal Injection, Pharmacologic Agent - OS - Left Eye       Time Out 12/20/2017. 9:52 AM. Confirmed correct patient, procedure, site, and patient consented.   Anesthesia Topical anesthesia was used. Anesthetic medications included Tetracaine 0.5%, Lidocaine 2%.   Procedure Preparation included 5% betadine to ocular surface, eyelid speculum. A supplied needle was used.   Injection: 1.25 mg Bevacizumab 1.25mg /0.79ml   NDC: 51761-607-37    Lot: [id number]     Expiration Date: 01/30/2018   Route: Intravitreal   Site: Left Eye   Waste: 0 mg  Post-op Post injection exam found visual acuity of at least counting fingers. The patient tolerated the procedure well. There were no complications. The patient received written and verbal post procedure care education.                 ASSESSMENT/PLAN:    ICD-10-CM   1. Proliferative diabetic retinopathy of both eyes with macular edema associated with type 2 diabetes mellitus (HCC) 02/12/2018 Intravitreal Injection, Pharmacologic Agent - OD - Right Eye    Intravitreal Injection, Pharmacologic Agent - OS - Left Eye    Bevacizumab (AVASTIN) SOLN 1.25 mg    Bevacizumab (AVASTIN) SOLN 1.25 mg  2. Retinal edema H35.81 OCT, Retina - OU - Both Eyes  3. Vitreous hemorrhage of left eye (HCC) H43.12   4. Essential hypertension I10   5. Hypertensive retinopathy of both eyes H35.033   6. Combined forms of age-related cataract of both eyes H25.813     1,2. Proliferative diabetic retinopathy with diabetic macular edema, OU - exam shows  significant IRH, macular edema, fine NVE OU; preretinal and vitreous heme OS -- slowly improving OU - FA from prior shows NVE OU - OCT confirms diabetic macular edema, OU -- DME with minimal improvement - s/p IVA #1 OS (03.13.19), #2 (04.15.19) - s/p IVA #1 OD (03.15.19), #2 (04.15.19) - s/p PRP OS (04.05.19), PRP fill-in OS (04.30.19) - VA stable OD, worse OS today - today, on exam, VH more central OS -- has been sleeping on right side, not head elevated - recommend IVA #3 OU today (5.13.19) - pt wishes to proceed - RBA of procedure discussed, questions answered - informed consent obtained and signed - see procedure note - f/u 2 weeks for DFE/OCT  3. Vitreous hemorrhage OS - secondary to #1, above - s/p IVA OS on 3.13.19 - s/p PRP OS (04.05.19) - pt states vision fluctuates, and has not been sleeping with head elevated -- sleeping on right side for last week or so - VH precautions reviewed -- minimize activities, keep head elevated, avoid ASA/NSAIDs/blood thinners as able - of note pt on warfarin - continue to monitor closely - will continue to fill in with PRP OS as able -- no view today - discussed possibility of surgery OS if VH does not clear  4,5. Hypertensive retinopathy OU - discussed importance of tight BP control - stable - monitor  6. 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 -  discussed diagnosis and progression - not yet visually significant - monitor for now   Ophthalmic Meds Ordered this visit:  Meds ordered this encounter  Medications  . Bevacizumab (AVASTIN) SOLN 1.25 mg  . Bevacizumab (AVASTIN) SOLN 1.25 mg       Return in about 2 weeks (around 01/03/2018) for F/U PDR OU, DFE, OCT.  There are no Patient Instructions on file for this visit.   Explained the diagnoses, plan, and follow up with the patient and they expressed understanding.  Patient  expressed understanding of the importance of proper follow up care.   This document serves as a record of services personally performed by Gardiner Sleeper, MD, PhD. It was created on their behalf by Catha Brow, [place], a certified ophthalmic assistant. The creation of this record is the provider's dictation and/or activities during the visit.  Electronically signed by: Catha Brow, [place]  05.10.19 3:49 PM    Gardiner Sleeper, M.D., Ph.D. Diseases & Surgery of the Retina and Vitreous [facility] 12/17/17  I have reviewed the above documentation for accuracy and completeness, and I agree with the above. Gardiner Sleeper, M.D., Ph.D. 12/20/17 3:49 PM    Abbreviations: M myopia (nearsighted); A astigmatism; H hyperopia (farsighted); P presbyopia; Mrx spectacle prescription;  CTL contact lenses; OD right eye; OS left eye; OU both eyes  XT exotropia; ET esotropia; PEK punctate epithelial keratitis; PEE punctate epithelial erosions; DES dry eye syndrome; MGD meibomian gland dysfunction; ATs artificial tears; PFAT's preservative free artificial tears; [place] nuclear sclerotic cataract; PSC posterior subcapsular cataract; ERM epi-retinal membrane; PVD posterior vitreous detachment; RD retinal detachment; DM diabetes mellitus; DR diabetic retinopathy; NPDR non-proliferative diabetic retinopathy; PDR proliferative diabetic retinopathy; CSME clinically significant macular edema; DME diabetic macular edema; dbh dot blot hemorrhages; CWS cotton wool spot; POAG primary open angle glaucoma; C/D cup-to-disc ratio; HVF humphrey visual field; GVF goldmann visual field; OCT optical coherence tomography; IOP intraocular pressure; BRVO Branch retinal vein occlusion; CRVO central retinal vein occlusion; CRAO central retinal artery occlusion; BRAO branch retinal artery occlusion; RT retinal tear; SB scleral buckle; PPV pars plana vitrectomy; VH Vitreous hemorrhage; PRP panretinal laser  photocoagulation; IVK intravitreal kenalog; VMT vitreomacular traction; MH Macular hole;  NVD neovascularization of the disc; NVE neovascularization elsewhere; AREDS age related eye disease study; ARMD age related macular degeneration; POAG primary open angle glaucoma; EBMD epithelial/anterior basement membrane dystrophy; ACIOL anterior chamber intraocular lens; IOL intraocular lens; PCIOL posterior chamber intraocular lens; Phaco/IOL phacoemulsification with intraocular lens placement; Zap photorefractive keratectomy; LASIK laser assisted in situ keratomileusis; HTN hypertension; DM diabetes mellitus; COPD chronic obstructive pulmonary disease

## 2017-12-20 ENCOUNTER — Ambulatory Visit (INDEPENDENT_AMBULATORY_CARE_PROVIDER_SITE_OTHER): Payer: BLUE CROSS/BLUE SHIELD | Admitting: Ophthalmology

## 2017-12-20 ENCOUNTER — Encounter (INDEPENDENT_AMBULATORY_CARE_PROVIDER_SITE_OTHER): Payer: Self-pay | Admitting: Ophthalmology

## 2017-12-20 ENCOUNTER — Ambulatory Visit (HOSPITAL_COMMUNITY): Payer: BLUE CROSS/BLUE SHIELD

## 2017-12-20 DIAGNOSIS — H3581 Retinal edema: Secondary | ICD-10-CM | POA: Diagnosis not present

## 2017-12-20 DIAGNOSIS — I10 Essential (primary) hypertension: Secondary | ICD-10-CM

## 2017-12-20 DIAGNOSIS — H25813 Combined forms of age-related cataract, bilateral: Secondary | ICD-10-CM | POA: Diagnosis not present

## 2017-12-20 DIAGNOSIS — H4312 Vitreous hemorrhage, left eye: Secondary | ICD-10-CM

## 2017-12-20 DIAGNOSIS — E113513 Type 2 diabetes mellitus with proliferative diabetic retinopathy with macular edema, bilateral: Secondary | ICD-10-CM | POA: Diagnosis not present

## 2017-12-20 DIAGNOSIS — H35033 Hypertensive retinopathy, bilateral: Secondary | ICD-10-CM | POA: Diagnosis not present

## 2017-12-20 MED ORDER — BEVACIZUMAB CHEMO INJECTION 1.25MG/0.05ML SYRINGE FOR KALEIDOSCOPE
1.2500 mg | INTRAVITREAL | Status: DC
  Administered 2017-12-20: 1.25 mg via INTRAVITREAL

## 2017-12-21 ENCOUNTER — Telehealth: Payer: Self-pay | Admitting: Hematology and Oncology

## 2017-12-21 ENCOUNTER — Inpatient Hospital Stay: Payer: BLUE CROSS/BLUE SHIELD

## 2017-12-21 ENCOUNTER — Inpatient Hospital Stay (HOSPITAL_BASED_OUTPATIENT_CLINIC_OR_DEPARTMENT_OTHER): Payer: BLUE CROSS/BLUE SHIELD | Admitting: Hematology and Oncology

## 2017-12-21 ENCOUNTER — Encounter: Payer: Self-pay | Admitting: Hematology and Oncology

## 2017-12-21 VITALS — BP 167/67 | HR 61 | Temp 98.3°F | Resp 17 | Ht 66.0 in | Wt 179.9 lb

## 2017-12-21 DIAGNOSIS — I130 Hypertensive heart and chronic kidney disease with heart failure and stage 1 through stage 4 chronic kidney disease, or unspecified chronic kidney disease: Secondary | ICD-10-CM

## 2017-12-21 DIAGNOSIS — E8809 Other disorders of plasma-protein metabolism, not elsewhere classified: Secondary | ICD-10-CM | POA: Diagnosis not present

## 2017-12-21 DIAGNOSIS — I480 Paroxysmal atrial fibrillation: Secondary | ICD-10-CM

## 2017-12-21 DIAGNOSIS — Z7901 Long term (current) use of anticoagulants: Secondary | ICD-10-CM | POA: Diagnosis not present

## 2017-12-21 DIAGNOSIS — D509 Iron deficiency anemia, unspecified: Secondary | ICD-10-CM | POA: Diagnosis not present

## 2017-12-21 DIAGNOSIS — N183 Chronic kidney disease, stage 3 (moderate): Secondary | ICD-10-CM | POA: Diagnosis not present

## 2017-12-21 DIAGNOSIS — E1122 Type 2 diabetes mellitus with diabetic chronic kidney disease: Secondary | ICD-10-CM

## 2017-12-21 DIAGNOSIS — I2510 Atherosclerotic heart disease of native coronary artery without angina pectoris: Secondary | ICD-10-CM | POA: Diagnosis not present

## 2017-12-21 DIAGNOSIS — Z794 Long term (current) use of insulin: Secondary | ICD-10-CM | POA: Diagnosis not present

## 2017-12-21 DIAGNOSIS — R740 Nonspecific elevation of levels of transaminase and lactic acid dehydrogenase [LDH]: Secondary | ICD-10-CM

## 2017-12-21 DIAGNOSIS — E11319 Type 2 diabetes mellitus with unspecified diabetic retinopathy without macular edema: Secondary | ICD-10-CM

## 2017-12-21 DIAGNOSIS — Z79899 Other long term (current) drug therapy: Secondary | ICD-10-CM | POA: Diagnosis not present

## 2017-12-21 NOTE — Telephone Encounter (Signed)
Appointments scheduled AVS/Calendar printed per 5/14 los °

## 2017-12-22 ENCOUNTER — Ambulatory Visit (HOSPITAL_COMMUNITY): Payer: BLUE CROSS/BLUE SHIELD

## 2017-12-22 ENCOUNTER — Telehealth: Payer: Self-pay

## 2017-12-22 ENCOUNTER — Other Ambulatory Visit: Payer: Self-pay | Admitting: Family Medicine

## 2017-12-22 DIAGNOSIS — Z1211 Encounter for screening for malignant neoplasm of colon: Secondary | ICD-10-CM

## 2017-12-22 LAB — KAPPA/LAMBDA LIGHT CHAINS
Kappa free light chain: 66.6 mg/L — ABNORMAL HIGH (ref 3.3–19.4)
Kappa, lambda light chain ratio: 2.2 — ABNORMAL HIGH (ref 0.26–1.65)
Lambda free light chains: 30.3 mg/L — ABNORMAL HIGH (ref 5.7–26.3)

## 2017-12-22 LAB — ERYTHROPOIETIN: Erythropoietin: 8.9 m[IU]/mL (ref 2.6–18.5)

## 2017-12-22 NOTE — Progress Notes (Signed)
Orders Placed This Encounter  Procedures  . Ambulatory referral to Gastroenterology    Referral Priority:   Routine    Referral Type:   Consultation    Referral Reason:   Specialty Services Required    Number of Visits Requested:   [place]  MSN, FNP-C [facility] [place] [address] [place], [place] [address] [phone]

## 2017-12-22 NOTE — Telephone Encounter (Signed)
Can you please put in order for referral for colonoscopy? Please advise. Thanks!

## 2017-12-23 ENCOUNTER — Encounter: Payer: Self-pay | Admitting: Gastroenterology

## 2017-12-23 LAB — VISCOSITY, SERUM: Viscosity, Serum: 1.8 rel.saline (ref 1.6–1.9)

## 2017-12-24 ENCOUNTER — Ambulatory Visit (HOSPITAL_COMMUNITY): Payer: BLUE CROSS/BLUE SHIELD

## 2017-12-24 ENCOUNTER — Encounter: Payer: BLUE CROSS/BLUE SHIELD | Admitting: Hematology and Oncology

## 2017-12-27 ENCOUNTER — Ambulatory Visit (HOSPITAL_COMMUNITY): Payer: BLUE CROSS/BLUE SHIELD

## 2017-12-27 LAB — MULTIPLE MYELOMA PANEL, SERUM
Albumin SerPl Elph-Mcnc: 3 g/dL (ref 2.9–4.4)
Albumin/Glob SerPl: 0.9 (ref 0.7–1.7)
Alpha 1: 0.3 g/dL (ref 0.0–0.4)
Alpha2 Glob SerPl Elph-Mcnc: 0.8 g/dL (ref 0.4–1.0)
B-GLOBULIN SERPL ELPH-MCNC: 1.3 g/dL (ref 0.7–1.3)
GAMMA GLOB SERPL ELPH-MCNC: 1.3 g/dL (ref 0.4–1.8)
GLOBULIN, TOTAL: 3.6 g/dL (ref 2.2–3.9)
IgA: 423 mg/dL — ABNORMAL HIGH (ref 87–352)
IgG (Immunoglobin G), Serum: 1654 mg/dL — ABNORMAL HIGH (ref 700–1600)
IgM (Immunoglobulin M), Srm: 59 mg/dL (ref 26–217)
Total Protein ELP: 6.6 g/dL (ref 6.0–8.5)

## 2017-12-28 ENCOUNTER — Telehealth: Payer: Self-pay

## 2017-12-28 ENCOUNTER — Ambulatory Visit (INDEPENDENT_AMBULATORY_CARE_PROVIDER_SITE_OTHER): Payer: BLUE CROSS/BLUE SHIELD | Admitting: Pharmacist Clinician (PhC)/ Clinical Pharmacy Specialist

## 2017-12-28 ENCOUNTER — Inpatient Hospital Stay: Payer: BLUE CROSS/BLUE SHIELD

## 2017-12-28 ENCOUNTER — Inpatient Hospital Stay (HOSPITAL_BASED_OUTPATIENT_CLINIC_OR_DEPARTMENT_OTHER): Payer: BLUE CROSS/BLUE SHIELD | Admitting: Hematology and Oncology

## 2017-12-28 VITALS — BP 179/77 | HR 86 | Temp 98.6°F | Resp 18 | Ht 66.0 in | Wt 182.2 lb

## 2017-12-28 DIAGNOSIS — Z5181 Encounter for therapeutic drug level monitoring: Secondary | ICD-10-CM

## 2017-12-28 DIAGNOSIS — Z79899 Other long term (current) drug therapy: Secondary | ICD-10-CM | POA: Diagnosis not present

## 2017-12-28 DIAGNOSIS — Z7901 Long term (current) use of anticoagulants: Secondary | ICD-10-CM

## 2017-12-28 DIAGNOSIS — Z794 Long term (current) use of insulin: Secondary | ICD-10-CM | POA: Diagnosis not present

## 2017-12-28 DIAGNOSIS — I130 Hypertensive heart and chronic kidney disease with heart failure and stage 1 through stage 4 chronic kidney disease, or unspecified chronic kidney disease: Secondary | ICD-10-CM

## 2017-12-28 DIAGNOSIS — E11319 Type 2 diabetes mellitus with unspecified diabetic retinopathy without macular edema: Secondary | ICD-10-CM

## 2017-12-28 DIAGNOSIS — I480 Paroxysmal atrial fibrillation: Secondary | ICD-10-CM | POA: Diagnosis not present

## 2017-12-28 DIAGNOSIS — I2510 Atherosclerotic heart disease of native coronary artery without angina pectoris: Secondary | ICD-10-CM

## 2017-12-28 DIAGNOSIS — D509 Iron deficiency anemia, unspecified: Secondary | ICD-10-CM

## 2017-12-28 DIAGNOSIS — E8809 Other disorders of plasma-protein metabolism, not elsewhere classified: Secondary | ICD-10-CM

## 2017-12-28 DIAGNOSIS — N183 Chronic kidney disease, stage 3 (moderate): Secondary | ICD-10-CM

## 2017-12-28 DIAGNOSIS — E1122 Type 2 diabetes mellitus with diabetic chronic kidney disease: Secondary | ICD-10-CM | POA: Diagnosis not present

## 2017-12-28 LAB — SEDIMENTATION RATE: SED RATE: 92 mm/h — AB (ref 0–22)

## 2017-12-28 LAB — POCT INR: INR: 1.1 — AB (ref 2.0–3.0)

## 2017-12-28 LAB — C-REACTIVE PROTEIN: CRP: 1 mg/dL — ABNORMAL HIGH (ref <1.0)

## 2017-12-28 NOTE — Patient Instructions (Signed)
Description   Take 2 tablets Tuesday May 21 and Wednesday May 22, then take 1 tablet daily except 1.5 tablets each Monday and Friday.  Repeat INR in 2 weeks

## 2017-12-28 NOTE — Telephone Encounter (Signed)
Return if symptoms worsen or fail to improve. Per 5/21 los

## 2017-12-29 ENCOUNTER — Ambulatory Visit (HOSPITAL_COMMUNITY): Payer: BLUE CROSS/BLUE SHIELD

## 2017-12-29 ENCOUNTER — Encounter: Payer: BLUE CROSS/BLUE SHIELD | Attending: Family Medicine | Admitting: *Deleted

## 2017-12-29 DIAGNOSIS — Z713 Dietary counseling and surveillance: Secondary | ICD-10-CM | POA: Insufficient documentation

## 2017-12-29 DIAGNOSIS — E1165 Type 2 diabetes mellitus with hyperglycemia: Secondary | ICD-10-CM | POA: Diagnosis not present

## 2017-12-29 DIAGNOSIS — E0865 Diabetes mellitus due to underlying condition with hyperglycemia: Secondary | ICD-10-CM

## 2017-12-29 LAB — RHEUMATOID FACTOR: Rhuematoid fact SerPl-aCnc: 35.1 IU/mL — ABNORMAL HIGH (ref 0.0–13.9)

## 2017-12-29 LAB — ANCA TITERS: C-ANCA: <1:20 {titer}

## 2017-12-29 LAB — C3 AND C4
C3 COMPLEMENT: 153 mg/dL (ref 82–167)
Complement C4, Body Fluid: 37 mg/dL (ref 14–44)

## 2017-12-30 LAB — ANTINUCLEAR ANTIBODIES, IFA: ANTINUCLEAR ANTIBODIES, IFA: NEGATIVE

## 2017-12-30 MED FILL — hydrALAZINE HCL 50 MG TABS: 50 | 30 days supply | Qty: 135 | Fill #0

## 2017-12-30 MED FILL — FUROSEMIDE 40 MG TAB: 40 | 30 days supply | Qty: 30 | Fill #0

## 2017-12-30 MED FILL — AMLODIPINE BESYLATE 10 MG T: 10 | 30 days supply | Qty: 30 | Fill #3

## 2017-12-30 MED FILL — CARVEDILOL 25 MG TABLET: 25 | 30 days supply | Qty: 120 | Fill #0

## 2017-12-30 MED FILL — BRILINTA 90 MG TABLET: 90 | 30 days supply | Qty: 60 | Fill #5

## 2017-12-30 MED FILL — LANTUS 100 UNITS/ML VIAL: 100 | 28 days supply | Qty: 10 | Fill #0

## 2017-12-31 ENCOUNTER — Ambulatory Visit (HOSPITAL_COMMUNITY): Payer: BLUE CROSS/BLUE SHIELD

## 2017-12-31 ENCOUNTER — Ambulatory Visit: Payer: BLUE CROSS/BLUE SHIELD | Admitting: Cardiovascular Disease

## 2018-01-04 ENCOUNTER — Ambulatory Visit
Admission: RE | Admit: 2018-01-04 | Discharge: 2018-01-04 | Disposition: A | Discharge status: Home / Self Care | Payer: BLUE CROSS/BLUE SHIELD | Source: Ambulatory Visit | Attending: Hematology and Oncology | Admitting: Hematology and Oncology

## 2018-01-04 DIAGNOSIS — Z1231 Encounter for screening mammogram for malignant neoplasm of breast: Secondary | ICD-10-CM | POA: Diagnosis not present

## 2018-01-04 DIAGNOSIS — Z129 Encounter for screening for malignant neoplasm, site unspecified: Secondary | ICD-10-CM

## 2018-01-04 NOTE — Patient Instructions (Signed)
Plan:  Aim for 3 Carb Choices per meal (45 grams) +/- 1 either way  Aim for 0-2 Carbs per snack if hungry  Include protein in moderation with your meals and snacks Consider reading food labels for Total Carbohydrate of foods Consider checking BG at alternate times per day as directed by MD  Consider taking medication as directed by MD

## 2018-01-04 NOTE — Progress Notes (Signed)
Diabetes Self-Management Education  Visit Type:    Appt. Start Time: 1530 Appt. End Time: 1700  01/04/2018  [name], identified by name and date of birth, is a 62 y.o. female with a diagnosis of Diabetes:  . She states history of DM for about 20 years now. She wants to learn more about her food options to help control her diabetes now.   ASSESSMENT  Height 5' 6.5" (1.689 m), weight 181 lb 3.2 oz (82.2 kg). Body mass index is 28.81 kg/m.  Diabetes Self-Management Education - 12/29/17 1544      Health Coping   How would you rate your overall health?  Fair      Psychosocial Assessment   Other persons present  Patient    Patient Concerns  Nutrition/Meal planning;Glycemic Control    Special Needs  None    How often do you need to have someone help you when you read instructions, pamphlets, or other written materials from your doctor or pharmacy?  4 - Often    What is the last grade level you completed in school?  some college      Complications   Last HgB A1C per patient/outside source  10.6 %    How often do you check your blood sugar?  3-4 times/day    Number of hypoglycemic episodes per month  0    Have you had a dilated eye exam in the past 12 months?  Yes    Have you had a dental exam in the past 12 months?  No      Dietary Intake   Breakfast  cheese and eggs with 1-2 pieces of toast OR flavored oatmeal x 1 package OR a hot dog occasionally on bun or with her eggs once a week OR cereal    Snack (morning)  fresh fruit usually watermelon OR jello    Lunch  skips often if eats a big breakfast     Dinner  chicken, starch, vegetables OR spaghetti with salad    Snack (evening)  PNB crackers OR jello OR fresh fruit with PNB, grapes OR vegetable salad    Beverage(s)  hot tea with honey, juice, water, regular gingerale, sweet tea occasionally      Exercise   Exercise Type  ADL's limited due to retinopathy      Patient Education   Previous Diabetes Education  Yes  (please comment)    Disease state   Factors that contribute to the development of diabetes    Nutrition management   Role of diet in the treatment of diabetes and the relationship between the three main macronutrients and blood glucose level;Food label reading, portion sizes and measuring food.;Carbohydrate counting    Monitoring  Identified appropriate SMBG and/or A1C goals.      Individualized Goals (developed by patient)   Nutrition  Follow meal plan discussed    Physical Activity  Exercise 3-5 times per week    Medications  take my medication as prescribed    Monitoring   test my blood glucose as discussed      Post-Education Assessment   Patient understands incorporating nutritional management into lifestyle.  Demonstrates understanding / competency    Patient understands using medications safely.  Demonstrates understanding / competency    Patient understands monitoring blood glucose, interpreting and using results  Demonstrates understanding / competency    Patient understands prevention, detection, and treatment of acute complications.  Demonstrates understanding / competency      Outcomes  Expected Outcomes  Demonstrated interest in learning. Expect positive outcomes    Future DMSE  PRN    Program Status  Completed       Individualized Plan for Diabetes Self-Management Training:   Learning Objective:  Patient will have a greater understanding of diabetes self-management. Patient education plan is to attend individual and/or group sessions per assessed needs and concerns.   Plan:   Patient Instructions  Plan:  Aim for 3 Carb Choices per meal (45 grams) +/- 1 either way  Aim for 0-2 Carbs per snack if hungry  Include protein in moderation with your meals and snacks Consider reading food labels for Total Carbohydrate of foods Consider checking BG at alternate times per day as directed by MD  Consider taking medication as directed by MD  Expected Outcomes:  Demonstrated  interest in learning. Expect positive outcomes  Education material provided: A1C conversion sheet, Meal plan card and Carbohydrate counting sheet  If problems or questions, patient to contact team via:  Phone  Future DSME appointment: PRN

## 2018-01-04 NOTE — Progress Notes (Addendum)
[facility] Note  01/05/2018     CHIEF COMPLAINT Patient presents for Retina Follow Up   HISTORY OF PRESENT ILLNESS: Amber Young is a 62 y.o. female who presents to the clinic today for:   HPI    Retina Follow Up    Patient presents with  Diabetic Retinopathy.  In both eyes.  This started 6 months ago.  Severity is mild.  Since onset it is stable.  I, the attending physician,  performed the HPI with the patient and updated documentation appropriately.          Comments    F/U PDR OU. Patient states she woke this am with blurred VA, her bs was 49. After eating breakfast , the blurred vision was better. When I ask patient if she was ready for Avastin today, she indicted Dr. Coralyn Pear was going to talk to her about scheduling  sx .        Last edited by Bernarda Caffey, MD on 01/05/2018  9:44 AM. (History)      Referring physician: Scot Jun, [address] [place], [place] [address]  HISTORICAL INFORMATION:   Selected notes from the MEDICAL RECORD NUMBER Referred by Molli Barrows, FNP for DM exam;  LEE-  Ocular Hx-  PMH- DM (A1C - 9.4 (02.13.19)), CKD, HTN, CAD, CHF, hx of stroke    CURRENT MEDICATIONS: No current outpatient medications on file. (Ophthalmic Drugs)   No current facility-administered medications for this visit.  (Ophthalmic Drugs)   Current Outpatient Medications (Other)  Medication Sig  . amLODipine (NORVASC) 10 MG tablet Take 1 tablet (10 mg total) by mouth daily.  Marland Kitchen atorvastatin (LIPITOR) 40 MG tablet Take 1 tablet (40 mg total) by mouth daily at 6 PM.  . carvedilol (COREG) 25 MG tablet Take 2 tablets (50 mg total) by mouth 2 (two) times daily.  . ferrous sulfate (FERROUSUL) 325 (65 FE) MG tablet Take 1 tablet (325 mg total) by mouth every other day.  . furosemide (LASIX) 40 MG tablet Take 1 tablet (40 mg total) by mouth daily.  . Glucose Blood (BLOOD GLUCOSE TEST STRIPS) STRP Use as directed  . hydrALAZINE  (APRESOLINE) 50 MG tablet Take 1.5 tablets (75 mg total) by mouth 3 (three) times daily.  . insulin glargine (LANTUS) 100 UNIT/ML injection Inject 0.1 mLs (10 Units total) into the skin 2 (two) times daily.  . insulin lispro (HUMALOG) 100 UNIT/ML injection Inject 0.05 mLs (5 Units total) into the skin 3 (three) times daily with meals.  . Insulin Pen Needle 29G X 10MM MISC Use as directed  . INSULIN SYRINGE .5CC/29G 29G X 1/2" 0.5 ML MISC Use to administer insulin three times daily  . losartan (COZAAR) 50 MG tablet Take 1 tablet (50 mg total) by mouth 2 (two) times daily.  . nitroGLYCERIN (NITROSTAT) 0.4 MG SL tablet Place 1 tablet (0.4 mg total) under the tongue every 5 (five) minutes x 3 doses as needed for chest pain.  . potassium chloride SA (K-DUR,KLOR-CON) 10 MEQ tablet Take 1 tablet with each dose of furosemide, daily.  . ticagrelor (BRILINTA) 90 MG TABS tablet Take 1 tablet (90 mg total) by mouth 2 (two) times daily.  Marland Kitchen warfarin (COUMADIN) 5 MG tablet Take 1 to 1.5 tablets by mouth daily as directed by coumadin clinic (Patient taking differently: Take 5-7.5 mg by mouth See admin instructions. Take 1 tablet (5 mg) on Sunday, Monday, Wednesday, Thursday, Friday, and Saturday then take  1 and 1/2 tablets (7.5mg ) on Tuesday)   Current Facility-Administered Medications (Other)  Medication Route  . Bevacizumab (AVASTIN) SOLN 1.25 mg Intravitreal  . Bevacizumab (AVASTIN) SOLN 1.25 mg Intravitreal  . Bevacizumab (AVASTIN) SOLN 1.25 mg Intravitreal  . Bevacizumab (AVASTIN) SOLN 1.25 mg Intravitreal  . Bevacizumab (AVASTIN) SOLN 1.25 mg Intravitreal  . Bevacizumab (AVASTIN) SOLN 1.25 mg Intravitreal      REVIEW OF SYSTEMS: ROS    Positive for: Endocrine, Eyes   Negative for: Constitutional, Gastrointestinal, Neurological, Skin, Genitourinary, Musculoskeletal, HENT, Cardiovascular, Respiratory, Psychiatric, Allergic/Imm, Heme/Lymph   Last edited by Zenovia Jordan, LPN on 2/95/2841  3:24 AM.  (History)       ALLERGIES Allergies  Allergen Reactions  . Ace Inhibitors Cough    PAST MEDICAL HISTORY Past Medical History:  Diagnosis Date  . Anemia   . CHF (congestive heart failure) ([place])   . CKD (chronic kidney disease), stage III ([place])   . Coronary artery disease   . GERD (gastroesophageal reflux disease)   . Heart murmur   . Hyperlipidemia   . Hypertension   . Stroke ([place]) 09/2014   numbness left upper lip, finger tips on left hand; "resolved" (05/18/2016)  . Type II diabetes mellitus ([place])    Past Surgical History:  Procedure Laterality Date  . CARDIAC CATHETERIZATION N/A 05/18/2016   Procedure: Left Heart Cath and Coronary Angiography;  Surgeon: Jettie Booze, MD;  Location: [facility];  Service: Cardiovascular;  Laterality: N/A;  . CARDIAC CATHETERIZATION N/A 05/18/2016   Procedure: Coronary Stent Intervention;  Surgeon: Jettie Booze, MD;  Location: [facility];  Service: Cardiovascular;  Laterality: N/A;  . CORONARY ANGIOPLASTY    . CORONARY ANGIOPLASTY WITH STENT PLACEMENT    . CORONARY STENT INTERVENTION N/A 09/07/2017   Procedure: CORONARY STENT INTERVENTION;  Surgeon: Leonie Man, MD;  Location: [facility];  Service: Cardiovascular;  Laterality: N/A;  . EYE SURGERY    . LEFT HEART CATH AND CORONARY ANGIOGRAPHY N/A 09/07/2017   Procedure: LEFT HEART CATH AND CORONARY ANGIOGRAPHY;  Surgeon: Leonie Man, MD;  Location: [facility];  Service: Cardiovascular;  Laterality: N/A;  . TUBAL LIGATION  1980  . VAGINAL HYSTERECTOMY  1999   "partial; fibroids"    FAMILY HISTORY Family History  Problem Relation Age of Onset  . Diabetes Mother   . Hypertension Mother   . COPD Mother   . Heart failure Mother   . Hypertension Sister   . Hypertension Brother     SOCIAL HISTORY Social History   Tobacco Use  . Smoking status: Never Smoker  . Smokeless tobacco: Never Used  Substance Use Topics  . Alcohol use: No     Alcohol/week: 0.0 oz  . Drug use: No         OPHTHALMIC EXAM:  Base Eye Exam    Visual Acuity (Snellen - Linear)      Right Left   Dist cc 20/70 20/150   Dist ph cc NI NI   Correction:  Glasses       Tonometry (Tonopen, 8:33 AM)      Right Left   Pressure 18 18       Pupils      Dark Light Shape React APD   Right 5 4 Round Slow None   Left 5 4 Round Slow None       Visual Fields (Counting fingers)      Left Right  Full Full       Extraocular Movement      Right Left    Full, Ortho Full, Ortho       Neuro/Psych    Oriented x3:  Yes   Mood/Affect:  Normal       Dilation    Both eyes:  1.0% Mydriacyl, 2.5% Phenylephrine @ 8:33 AM        Slit Lamp and Fundus Exam    Slit Lamp Exam      Right Left   Lids/Lashes Dermatochalasis - upper lid, mild Meibomian gland dysfunction Dermatochalasis - upper lid, Meibomian gland dysfunction   Conjunctiva/Sclera White and quiet White and quiet   Cornea Arcus, 1+ Punctate epithelial erosions, 1+ Guttata Arcus, 2+ Punctate epithelial erosions, EBMD, trace Guttata   Anterior Chamber Deep and quiet Deep and quiet   Iris Round and dilated, No NVI Round and dilated, No NVI, persistent pupillary membrane from 0703 to 0130   Lens 2-3+ Nuclear sclerosis, 2+ Cortical cataract, Vacuoles, iridescent refractile opacities 2-3+ Nuclear sclerosis, 2+ Cortical cataract, Vacuoles, iridescent refractile opacities   Vitreous Vitreous syneresis, mild Asteroid hyalosis superiorly +RBC/diffuse VH - settling inferiorly, mild Asteroid hyalosis superior vitreous       Fundus Exam      Right Left   Disc compact, Pink and Sharp hazy view, perivascular fibrosis at 0800 with regressing NVD, fibrosis overlying disc, Pink and Sharp   C/D Ratio 0.5 0.5   Macula Blunted foveal reflex, +edema, +DBH, +superior exudate, Microaneurysms, Intraretinal hemorrhage hazy view -- central concentration of VH, Exudates visible superotemporal to fovea   Vessels  Tortuous, ? Early fine NVE +NVE w/ large preretinal heme inf nasal to disc, scattered fibrosis, +NVD   Periphery Attached, 360 DBH red VH clotting and settling inferiorly, concentration of VH over macula fibrosis nasal to disc, PRP scars nasal to disc, fibrosis w/ focal TRD superiorly          IMAGING AND PROCEDURES  Imaging and Procedures for 12/07/17  OCT, Retina - OU - Both Eyes       Right Eye Quality was borderline. Central Foveal Thickness: 635. Progression has been stable. Findings include abnormal foveal contour, intraretinal fluid, subretinal fluid, vitreomacular adhesion .   Left Eye Quality was poor. Central Foveal Thickness: 531. Findings include intraretinal fluid, vitreous traction, subretinal fluid (Interval improvement of vitreous opacities; focal TRD at 1200).   Notes *Images captured and stored on drive  Diagnosis / Impression:  Central DME OU OD: stable to slightly worse from prior OS: interval improvement of vitreous opacities; severe DME and focal TRD superiorly  Clinical management:  See below  Abbreviations: NFP - Normal foveal profile. CME - cystoid macular edema. PED - pigment epithelial detachment. IRF - intraretinal fluid. SRF - subretinal fluid. EZ - ellipsoid zone. ERM - epiretinal membrane. ORA - outer retinal atrophy. ORT - outer retinal tubulation. SRHM - subretinal hyper-reflective material                  ASSESSMENT/PLAN:    ICD-10-CM   1. Proliferative diabetic retinopathy of both eyes with macular edema associated with type 2 diabetes mellitus ([place]) P82.4235   2. Retinal edema H35.81 OCT, Retina - OU - Both Eyes  3. Vitreous hemorrhage of left eye (HCC) H43.12   4. 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 Essential hypertension I10   6. Hypertensive retinopathy of both eyes H35.033   7. Combined forms of age-related cataract of both eyes H25.813  1,2. Proliferative diabetic retinopathy with diabetic macular edema,  OU - exam shows significant IRH, macular edema, fine NVE OU; preretinal and vitreous heme OS -- slowly improving OU - FA from prior shows NVE OU - OCT confirms diabetic macular edema, OU -- DME with minimal improvement - s/p IVA #1 OS (03.13.19), #2 (04.15.19), #3 (05.13.19) - s/p IVA #1 OD (03.15.19), #2 (04.15.19), #3 (05.13.19) - s/p PRP OS (04.05.19), PRP fill-in OS (04.30.19); OD will need PRP eventually - VA stable OD, improved OS today - today, on exam, VH persists with minimal improvement and now with TRD superiorly - recommend 25g PPV/MP/EL/Air vs Gas OS under general anesthesia for VH and TRD repair OS - RBA of procedure discussed, questions answered - informed consent obtained and signed - pt will need pre-op clearance from PCP, cardiologist and Anesthesia team - case scheduled for Thursday, 01/13/18 in [facility] - recommend pre-op IVA OS next Tuesday  3. Vitreous hemorrhage OS - secondary to #1, above - s/p IVA OS as above - s/p PRP OS (04.05.19 and 04.30.19) -- incomplete due to [facility] - VH precautions reviewed -- minimize activities, keep head elevated, avoid ASA/NSAIDs/blood thinners as able - of note pt on warfarin - scheduled for PPV as above  4. Tractional retinal detachment, OS secondary to #1-3 - focal TRD superior midzone OS - scheduled for PPV as above  5,6. Hypertensive retinopathy OU - discussed importance of tight BP control - monitor  6. 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 - discussed diagnosis and progression - not yet visually significant - monitor for now   Ophthalmic Meds Ordered this visit:  No orders of the defined types were placed in this encounter.      Return in about 1 week (around 01/12/2018) for pre op IVA OS.  There are no Patient Instructions on file for this visit.   Explained the diagnoses, plan, and follow up with the patient and  they expressed understanding.  Patient expressed understanding of the importance of proper follow up care.   This document serves as a record of services personally performed by Gardiner Sleeper, MD, PhD. It was created on their behalf by Catha Brow, [place], a certified ophthalmic assistant. The creation of this record is the provider's dictation and/or activities during the visit.  Electronically signed by: Catha Brow, COA  05.28.19 1:25 PM   Gardiner Sleeper, M.D., Ph.D. Diseases & Surgery of the Retina and [place] 01/05/18  I have reviewed the above documentation for accuracy and completeness, and I agree with the above. Gardiner Sleeper, M.D., Ph.D. 01/05/18 1:25 PM     Abbreviations: M myopia (nearsighted); A astigmatism; H hyperopia (farsighted); P presbyopia; Mrx spectacle prescription;  CTL contact lenses; OD right eye; OS left eye; OU both eyes  XT exotropia; ET esotropia; PEK punctate epithelial keratitis; PEE punctate epithelial erosions; DES dry eye syndrome; MGD meibomian gland dysfunction; ATs artificial tears; PFAT's preservative free artificial tears; [place] nuclear sclerotic cataract; PSC posterior subcapsular cataract; ERM epi-retinal membrane; PVD posterior vitreous detachment; RD retinal detachment; DM diabetes mellitus; DR diabetic retinopathy; NPDR non-proliferative diabetic retinopathy; PDR proliferative diabetic retinopathy; CSME clinically significant macular edema; DME diabetic macular edema; dbh dot blot hemorrhages; CWS cotton wool spot; POAG primary open angle glaucoma; C/D cup-to-disc ratio; HVF humphrey visual field; GVF goldmann visual field; OCT optical coherence tomography; IOP intraocular pressure; BRVO  Branch retinal vein occlusion; CRVO central retinal vein occlusion; CRAO central retinal artery occlusion; BRAO branch retinal artery occlusion; RT retinal tear; SB scleral buckle; PPV pars plana vitrectomy; VH Vitreous  hemorrhage; PRP panretinal laser photocoagulation; IVK intravitreal kenalog; VMT vitreomacular traction; MH Macular hole;  NVD neovascularization of the disc; NVE neovascularization elsewhere; AREDS age related eye disease study; ARMD age related macular degeneration; POAG primary open angle glaucoma; EBMD epithelial/anterior basement membrane dystrophy; ACIOL anterior chamber intraocular lens; IOL intraocular lens; PCIOL posterior chamber intraocular lens; Phaco/IOL phacoemulsification with intraocular lens placement; [place] photorefractive keratectomy; LASIK laser assisted in situ keratomileusis; HTN hypertension; DM diabetes mellitus; COPD chronic obstructive pulmonary disease

## 2018-01-05 ENCOUNTER — Encounter (INDEPENDENT_AMBULATORY_CARE_PROVIDER_SITE_OTHER): Payer: Self-pay | Admitting: Ophthalmology

## 2018-01-05 ENCOUNTER — Ambulatory Visit (HOSPITAL_COMMUNITY): Payer: BLUE CROSS/BLUE SHIELD

## 2018-01-05 ENCOUNTER — Ambulatory Visit (INDEPENDENT_AMBULATORY_CARE_PROVIDER_SITE_OTHER): Payer: BLUE CROSS/BLUE SHIELD | Admitting: Ophthalmology

## 2018-01-05 DIAGNOSIS — E113513 Type 2 diabetes mellitus with proliferative diabetic retinopathy with macular edema, bilateral: Secondary | ICD-10-CM

## 2018-01-05 DIAGNOSIS — H4312 Vitreous hemorrhage, left eye: Secondary | ICD-10-CM

## 2018-01-05 DIAGNOSIS — H3342 Traction detachment of retina, left eye: Secondary | ICD-10-CM

## 2018-01-05 DIAGNOSIS — H3581 Retinal edema: Secondary | ICD-10-CM | POA: Diagnosis not present

## 2018-01-05 DIAGNOSIS — I10 Essential (primary) hypertension: Secondary | ICD-10-CM | POA: Diagnosis not present

## 2018-01-05 DIAGNOSIS — H35033 Hypertensive retinopathy, bilateral: Secondary | ICD-10-CM | POA: Diagnosis not present

## 2018-01-05 DIAGNOSIS — H25813 Combined forms of age-related cataract, bilateral: Secondary | ICD-10-CM | POA: Diagnosis not present

## 2018-01-06 ENCOUNTER — Telehealth: Payer: Self-pay | Admitting: Cardiovascular Disease

## 2018-01-06 MED FILL — WARFARIN SODIUM 5 MG TABLET: 5 | 30 days supply | Qty: 34 | Fill #3

## 2018-01-06 NOTE — Assessment & Plan Note (Signed)
62 y.o. female with progressive anemia of at least 3-year duration with chronic microcytosis and hypochromia.  Findings would suggest underlying iron deficiency, but iron panel obtained in October 2018 does not fully support the diagnosis based on ferritin of over 90.  Additional testing obtained during our last visit in the clinic shows no evidence of hemoglobinopathy.  Patient demonstrates stable iron levels which do not support diagnosis of iron deficiency at this time.  Differential diagnosis includes anemia of chronic disease versus other types of anemia considering presence of significant protein gap with total protein of 7.9 with albumin of only 2.6.  LDH is also mildly elevated at 309.  Plan: -Additional lab work today as outlined below. -Return to our clinic in 1 week to review the laboratory results. -Additionally, patient is out of date for her cancer screening including mammogram and colonoscopy.  Both of those will be ordered

## 2018-01-06 NOTE — Progress Notes (Signed)
[facility] Follow-[facility] Visit:  Assessment: Microcytic anemia 62 y.o. female with progressive anemia of at least 3-year duration with chronic microcytosis and hypochromia.  Findings would suggest underlying iron deficiency, but iron panel obtained in October 2018 does not fully support the diagnosis based on ferritin of over 90.  Additional testing obtained during our last visit in the clinic shows no evidence of hemoglobinopathy.  Patient demonstrates stable iron levels which do not support diagnosis of iron deficiency at this time.  Differential diagnosis includes anemia of chronic disease versus other types of anemia considering presence of significant protein gap with total protein of 7.9 with albumin of only 2.6.  LDH is also mildly elevated at 309.  Plan: -Additional lab work today as outlined below. -Return to our clinic in 1 week to review the laboratory results. -Additionally, patient is out of date for her cancer screening including mammogram and colonoscopy.  Both of those will be ordered   Voice recognition software was used and creation of this note. Despite my best effort at editing the text, some misspelling/errors may have occurred.  Orders Placed This Encounter  Procedures  . Multiple Myeloma Panel (SPEP&IFE w/QIG)    Standing Status:   Future    Number of Occurrences:   1    Standing Expiration Date:   12/21/2018  . Kappa/lambda light chains    Standing Status:   Future    Number of Occurrences:   1    Standing Expiration Date:   12/21/2018  . Viscosity, serum    Standing Status:   Future    Number of Occurrences:   1    Standing Expiration Date:   12/21/2018  . Erythropoietin    Standing Status:   Future    Number of Occurrences:   1    Standing Expiration Date:   12/21/2018    Cancer Staging No matching staging information was found for the patient.  All questions were answered.  . The patient knows to call the clinic with any problems,  questions or concerns.  This note was electronically signed.    History of Presenting Illness Amber Young is a 62 y.o. female followed in the [place] for evaluation of iron deficiency anemia, referred by Dr. Herminio Commons.  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  Additionally, patient has paroxysmal atrial fibrillation, hypertension, coronary artery disease with history of coronary stenting in January 2018 and 2 additional stents placed in January 2019, history of total abdominal hysterectomy due to bleeding fibroids.  Patient is presently on warfarin, Brilinta, and has been taking ferrous sulfate 325 mg every other day for the past 3 weeks.  Patient denies any family history of sickle cell disease, anemia, or malignancy.  Returns to the clinic to review laboratory findings.  At last visit, her dose of iron has been increased up to 3 times daily.  At the present time, patient denies any significant active complaints other than fatigue with energy level at 50% of desired.  Denies fevers, chills, night sweats.  No active shortness of breath, chest pain, or cough.  Patient denies epistaxis, hemoptysis, hematochezia, melena, vaginal bleeding, or hematuria.  Oncological/hematological History: --Labs, 02/03/13: Hgb 14.3 --Labs, 10/02/14: Hgb 11.3, MCV 76.0, MCH 24.4, MCHC 32.1, RDW 13.3; --Labs, 12/05/15: Hgb 10.7, MCV 73.8, MCH 24.6, MCHC 33.3, RDW 13.3; --Labs, 12/02/16: Hgb 11.9, MCV 73.6,  MCH 24.0, MCHC 32.8, RDW 15.3; --Labs, 02/23/17: Hgb   9.5, MCV 78.3, MCH 24.3, MCHC 31.0, RDW 16.5;  --Labs, 05/24/17: Hgb   8.6, MCV 76.3, MCH 24.2, MCHC 31.7, RDW 13.9; Fe 40, FeSat 13%, TIBC 308, Ferritin 92 --Labs, 09/06/17: Hgb   9.1, MCV 78.1, MCH 24.6, MCHC 31.5, RDW 14.9; --Labs, 09/22/17: Hgb   9.4, MCV 78.0, MCH 24.3, MCHC 31.2, RDW 14.9; --Labs,  11/17/17: Hgb   8.3, MCV 73.5, MCH 23.2, MCHC 31.6, RDW 14.3; --Labs, 12/13/17: Hgb   8.4, MCV 74.7, MCH 23.6, MCHC 31.6, RDW 16.0; Fe 38, FeSat 15%, TIBC 259, Ferritin 77, Transferrin 203, sTFR 20.7, Cu 144, Ceruloplasmin 33; tProt 7.1, Alb 2.6, LDH 309; Hgb A 97.9%, Hgb A2 2.1%, no abnormal hemoglobin forms.   No history exists.    Medical History: Past Medical History:  Diagnosis Date  . Anemia   . CHF (congestive heart failure) ([place])   . CKD (chronic kidney disease), stage III ([place])   . Coronary artery disease   . GERD (gastroesophageal reflux disease)   . Heart murmur   . Hyperlipidemia   . Hypertension   . Stroke ([place]) 09/2014   numbness left upper lip, finger tips on left hand; "resolved" (05/18/2016)  . Type II diabetes mellitus ([place])     Surgical History: Past Surgical History:  Procedure Laterality Date  . CARDIAC CATHETERIZATION N/A 05/18/2016   Procedure: Left Heart Cath and Coronary Angiography;  Surgeon: Jettie Booze, MD;  Location: [place] [facility];  Service: Cardiovascular;  Laterality: N/A;  . CARDIAC CATHETERIZATION N/A 05/18/2016   Procedure: Coronary Stent Intervention;  Surgeon: Jettie Booze, MD;  Location: [employer] [facility];  Service: Cardiovascular;  Laterality: N/A;  . CORONARY ANGIOPLASTY    . CORONARY ANGIOPLASTY WITH STENT PLACEMENT    . CORONARY STENT INTERVENTION N/A 09/07/2017   Procedure: CORONARY STENT INTERVENTION;  Surgeon: Leonie Man, MD;  Location: [facility] [facility];  Service: Cardiovascular;  Laterality: N/A;  . EYE SURGERY    . LEFT HEART CATH AND CORONARY ANGIOGRAPHY N/A 09/07/2017   Procedure: LEFT HEART CATH AND CORONARY ANGIOGRAPHY;  Surgeon: Leonie Man, MD;  Location: [facility];  Service: Cardiovascular;  Laterality: N/A;  . TUBAL LIGATION  1980  . VAGINAL HYSTERECTOMY  1999   "partial; fibroids"    Family History: Family History  Problem Relation Age of Onset  . Diabetes Mother   . Hypertension  Mother   . COPD Mother   . Heart failure Mother   . Hypertension Sister   . Hypertension Brother     Social History: Social History   Socioeconomic History  . Marital status: Married    Spouse name: Not on file  . Number of children: Not on file  . Years of education: 71  . Highest education level: Not on file  Occupational History  . Occupation: hairdresser  Social Needs  . Financial resource strain: Not on file  . Food insecurity:    Worry: Not on file    Inability: Not on file  . Transportation needs:    Medical: Not on file    Non-medical: Not on file  Tobacco Use  . Smoking status: Never Smoker  . Smokeless tobacco: Never Used  Substance and Sexual Activity  . Alcohol use: No    Alcohol/week: 0.0 oz  . Drug use: No  . Sexual activity: Yes  Lifestyle  . Physical activity:    Days per week:  Not on file    Minutes per session: Not on file  . Stress: Not on file  Relationships  . Social connections:    Talks on phone: Not on file    Gets together: Not on file    Attends religious service: Not on file    Active member of club or organization: Not on file    Attends meetings of clubs or organizations: Not on file    Relationship status: Not on file  . Intimate partner violence:    Fear of current or ex partner: Not on file    Emotionally abused: Not on file    Physically abused: Not on file    Forced sexual activity: Not on file  Other Topics Concern  . Not on file  Social History Narrative   Married, 2 children, hairdresser   Caffeine use- occasional tea or coffee   Right handed    Allergies: Allergies  Allergen Reactions  . Ace Inhibitors Cough    Medications:  Current Outpatient Medications  Medication Sig Dispense Refill  . amLODipine (NORVASC) 10 MG tablet Take 1 tablet (10 mg total) by mouth daily. 90 tablet 2  . atorvastatin (LIPITOR) 40 MG tablet Take 1 tablet (40 mg total) by mouth daily at 6 PM. 90 tablet 1  . carvedilol (COREG) 25 MG  tablet Take 2 tablets (50 mg total) by mouth 2 (two) times daily. 60 tablet 3  . ferrous sulfate (FERROUSUL) 325 (65 FE) MG tablet Take 1 tablet (325 mg total) by mouth every other day. 90 tablet 3  . furosemide (LASIX) 40 MG tablet Take 1 tablet (40 mg total) by mouth daily. 90 tablet 3  . Glucose Blood (BLOOD GLUCOSE TEST STRIPS) STRP Use as directed (Patient not taking: Reported on 01/06/2018) 200 each 0  . hydrALAZINE (APRESOLINE) 50 MG tablet Take 1.5 tablets (75 mg total) by mouth 3 (three) times daily. (Patient taking differently: Take 50 mg by mouth 3 (three) times daily. ) 135 tablet 6  . insulin glargine (LANTUS) 100 UNIT/ML injection Inject 0.1 mLs (10 Units total) into the skin 2 (two) times daily. 20 mL 11  . insulin lispro (HUMALOG) 100 UNIT/ML injection Inject 0.05 mLs (5 Units total) into the skin 3 (three) times daily with meals. 30 mL 3  . Insulin Pen Needle 29G X 10MM MISC Use as directed (Patient not taking: Reported on 01/06/2018) 100 each 3  . INSULIN SYRINGE .5CC/29G 29G X 1/2" 0.5 ML MISC Use to administer insulin three times daily (Patient not taking: Reported on 01/06/2018) 200 each 5  . losartan (COZAAR) 50 MG tablet Take 1 tablet (50 mg total) by mouth 2 (two) times daily. 90 tablet 3  . nitroGLYCERIN (NITROSTAT) 0.4 MG SL tablet Place 1 tablet (0.4 mg total) under the tongue every 5 (five) minutes x 3 doses as needed for chest pain. 30 tablet 12  . potassium chloride SA (K-DUR,KLOR-CON) 10 MEQ tablet Take 1 tablet with each dose of furosemide, daily. (Patient taking differently: Take 10 mEq by mouth daily. ) 30 tablet 2  . ticagrelor (BRILINTA) 90 MG TABS tablet Take 1 tablet (90 mg total) by mouth 2 (two) times daily. 180 tablet 3  . warfarin (COUMADIN) 5 MG tablet Take 1 to 1.5 tablets by mouth daily as directed by coumadin clinic (Patient taking differently: Take 5-7.5 mg by mouth See admin instructions. Take 1 tablet (5 mg) on Sunday, Monday, Wednesday, Thursday and  Saturday then take 1 and 1/2 tablets (  7.56m) on Tuesday and Friday) 120 tablet 1   Current Facility-Administered Medications  Medication Dose Route Frequency Provider Last Rate Last Dose  . Bevacizumab (AVASTIN) SOLN 1.25 mg  1.25 mg Intravitreal  ZBernarda Caffey MD   1.25 mg at 10/20/17 1158  . Bevacizumab (AVASTIN) SOLN 1.25 mg  1.25 mg Intravitreal  ZBernarda Caffey MD   1.25 mg at 10/22/17 0827  . Bevacizumab (AVASTIN) SOLN 1.25 mg  1.25 mg Intravitreal  ZBernarda Caffey MD   1.25 mg at 11/22/17 1425  . Bevacizumab (AVASTIN) SOLN 1.25 mg  1.25 mg Intravitreal  ZBernarda Caffey MD   1.25 mg at 11/22/17 1425  . Bevacizumab (AVASTIN) SOLN 1.25 mg  1.25 mg Intravitreal  ZBernarda Caffey MD   1.25 mg at 12/20/17 1115  . Bevacizumab (AVASTIN) SOLN 1.25 mg  1.25 mg Intravitreal  ZBernarda Caffey MD   1.25 mg at 12/20/17 1116    Review of Systems: Review of Systems  Constitutional: Positive for fatigue.  All other systems reviewed and are negative.    PHYSICAL EXAMINATION Blood pressure (!) 167/67, pulse 61, temperature 98.3 F (36.8 C), temperature source Oral, resp. rate 17, height '5\' 6"'  (1.676 m), weight 179 lb 14.4 oz (81.6 kg), SpO2 100 %.  ECOG PERFORMANCE STATUS: 0 - Asymptomatic  Physical Exam  Constitutional: She is oriented to person, place, and time. She appears well-developed and well-nourished. No distress.  HENT:  Head: Normocephalic and atraumatic.  Mouth/Throat: Oropharynx is clear and moist. No oropharyngeal exudate.  Eyes: Pupils are equal, round, and reactive to light. Conjunctivae and EOM are normal. No scleral icterus.  Neck: No thyromegaly present.  Cardiovascular: Normal rate, regular rhythm, normal heart sounds and intact distal pulses. Exam reveals no gallop and no friction rub.  No murmur heard. Pulmonary/Chest: Effort normal and breath sounds normal. No stridor. No respiratory distress. She has no wheezes. She has no rales.  Abdominal: Soft. Bowel sounds are normal. She  exhibits no distension and no mass. There is no tenderness. There is no guarding.  Musculoskeletal: She exhibits no edema.  Lymphadenopathy:    She has no cervical adenopathy.  Neurological: She is alert and oriented to person, place, and time. She displays normal reflexes. No cranial nerve deficit or sensory deficit.  Skin: Skin is warm and dry. No rash noted. She is not diaphoretic. No erythema. No pallor.     LABORATORY DATA: I have personally reviewed the data as listed: Appointment on 12/21/2017  Component Date Value Ref Range Status  . Erythropoietin 12/21/2017 8.9  2.6 - 18.5 mIU/mL Final   Comment: (NOTE) [employer] Coul[employer] UniCel DxI 8[place] Ridgeobtained with different assay methods or kits cannot be used interchangeably. Results cannot be interpreted as absolute evidence of the presence or absence of malignant disease. Performed At: [id number] FarmerMD PIO:[id number] at [facility], [address] [place] [address]  . Viscosity, Serum 12/21/2017 1.8  1.6 - 1.9 rel.saline Final   Comment: (NOTE) Values above 2.7 may indicate paraproteinemia is present. This test was developed and its performance characteristics determined by [facility]. It has not been cleared or approved by the Food and Drug Administration. Performed At: [id number] FarmerMD PGX:[id number] at [facility], [address] [place] [address]  . Kappa free light chain 12/21/2017 66.6* 3.3 - 19.4 mg/L Final  .  Lamda free light chains 12/21/2017 30.3* 5.7 - 26.3 mg/L Final  . Kappa, lamda light chain ratio 12/21/2017 2.20* 0.26 - 1.65 Final   Comment: (NOTE) Performed At: [facility] [place], [place] [id number] Rush Farmer MD HW:[id number] Performed at  [facility], [place] [address], [place], [place] [address]   . IgG (Immunoglobin G), Serum 12/21/2017 1,654* 700 - 1,600 mg/dL Final  . IgA 12/21/2017 423* 87 - 352 mg/dL Final  . IgM (Immunoglobulin M), Srm 12/21/2017 59  26 - 217 mg/dL Final  . Total Protein ELP 12/21/2017 6.6  6.0 - 8.5 g/dL Corrected  . Albumin SerPl Elph-Mcnc 12/21/2017 3.0  2.9 - 4.4 g/dL Corrected  . Alpha 1 12/21/2017 0.3  0.0 - 0.4 g/dL Corrected  . Alpha2 Glob SerPl Elph-Mcnc 12/21/2017 0.8  0.4 - 1.0 g/dL Corrected  . B-Globulin SerPl Elph-Mcnc 12/21/2017 1.3  0.7 - 1.3 g/dL Corrected  . Gamma Glob SerPl Elph-Mcnc 12/21/2017 1.3  0.4 - 1.8 g/dL Corrected  . M Protein SerPl Elph-Mcnc 12/21/2017 Not Observed  Not Observed g/dL Corrected  . Globulin, Total 12/21/2017 3.6  2.2 - 3.9 g/dL Corrected  . Albumin/Glob SerPl 12/21/2017 0.9  0.7 - 1.7 Corrected  . IFE 1 12/21/2017 Comment   Corrected   Comment: (NOTE) An apparent polyclonal gammopathy: IgG and IgA. Kappa and lambda typing appear increased.   . Please Note 12/21/2017 Comment   Corrected   Comment: (NOTE) Protein electrophoresis scan will follow via computer, mail, or courier delivery. Performed At: [facility] [place], [place] [id number] Rush Farmer MD KP:[id number] Performed at [facility], [place] [address], [place], [place] [address]   Office Visit on 12/15/2017  Component Date Value Ref Range Status  . Glucose, UA 12/15/2017 250   Final  . Bilirubin, UA 12/15/2017 neg   Final  . Ketones, UA 12/15/2017 neg   Final  . Spec Grav, UA 12/15/2017 1.025  1.010 - 1.025 Final  . Blood, UA 12/15/2017 trace   Final  . pH, UA 12/15/2017 5.5  5.0 - 8.0 Final  . Protein, UA 12/15/2017 >=300   Final  . Urobilinogen, UA 12/15/2017 0.2  0.2 or 1.0 E.U./dL Final  . Nitrite, UA 12/15/2017 neg   Final  . Leukocytes, UA 12/15/2017 Negative  Negative Final  . Hemoglobin A1C 12/15/2017  10.6   Final  . POC Glucose 12/15/2017 254* 70 - 99 mg/dl Final       Ardath Sax, MD

## 2018-01-06 NOTE — Telephone Encounter (Signed)
Returned call. Shayla from [place] wanted to inform Dr. Parks Ranger they needed to get pt's coumadin from a different manufacturer. I will send to Dr. Bronson Ing as an Juluis Rainier.

## 2018-01-06 NOTE — Telephone Encounter (Signed)
Please give Shayla from [facility] and [place] a call concerning the pt's meds @ [phone]

## 2018-01-07 ENCOUNTER — Ambulatory Visit (HOSPITAL_COMMUNITY): Payer: BLUE CROSS/BLUE SHIELD

## 2018-01-07 NOTE — H&P (Signed)
Amber Young is an 62 y.o. female.    Chief Complaint: decreased vision, persistent vitreous hemorrhage OS  HPI: Pt with history of proliferative diabetic retinopathy with presistent vitreous hemorrhage OS. On exam has developed a tractional retinal detachment OS.  Past Medical History:  Diagnosis Date  . Anemia   . CHF (congestive heart failure) ([place])   . CKD (chronic kidney disease), stage III ([place])   . Coronary artery disease   . GERD (gastroesophageal reflux disease)   . Heart murmur   . Hyperlipidemia   . Hypertension   . Stroke ([place]) 09/2014   numbness left upper lip, finger tips on left hand; "resolved" (05/18/2016)  . Type II diabetes mellitus ([place])     Past Surgical History:  Procedure Laterality Date  . CARDIAC CATHETERIZATION N/A 05/18/2016   Procedure: Left Heart Cath and Coronary Angiography;  Surgeon: Jettie Booze, MD;  Location: [place] [facility];  Service: Cardiovascular;  Laterality: N/A;  . CARDIAC CATHETERIZATION N/A 05/18/2016   Procedure: Coronary Stent Intervention;  Surgeon: Jettie Booze, MD;  Location: [place] [facility];  Service: Cardiovascular;  Laterality: N/A;  . CORONARY ANGIOPLASTY    . CORONARY ANGIOPLASTY WITH STENT PLACEMENT    . CORONARY STENT INTERVENTION N/A 09/07/2017   Procedure: CORONARY STENT INTERVENTION;  Surgeon: Leonie Man, MD;  Location: [place] [facility];  Service: Cardiovascular;  Laterality: N/A;  . EYE SURGERY    . LEFT HEART CATH AND CORONARY ANGIOGRAPHY N/A 09/07/2017   Procedure: LEFT HEART CATH AND CORONARY ANGIOGRAPHY;  Surgeon: Leonie Man, MD;  Location: [facility] [place] [facility];  Service: Cardiovascular;  Laterality: N/A;  . TUBAL LIGATION  1980  . VAGINAL HYSTERECTOMY  1999   "partial; fibroids"    Family History  Problem Relation Age of Onset  . Diabetes Mother   . Hypertension Mother   . COPD Mother   . Heart failure Mother   . Hypertension Sister   . Hypertension Brother    Social History:   reports that she has never smoked. She has never used smokeless tobacco. She reports that she does not drink alcohol or use drugs.  Allergies:  Allergies  Allergen Reactions  . Ace Inhibitors Cough    No medications prior to admission.    Review of systems otherwise negative  There were no vitals taken for this visit.  Physical exam: Mental status: oriented x3. Eyes: See eye exam associated with this date of surgery Ears, Nose, Throat: within normal limits Neck: Within Normal limits General: within normal limits Chest: Within normal limits Breast: deferred Heart: Within normal limits Abdomen: Within normal limits GU: deferred Extremities: within normal limits Skin: within normal limits  Assessment/Plan 1. 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 Vitreoretinal Surgeon [facility]

## 2018-01-09 ENCOUNTER — Encounter: Payer: Self-pay | Admitting: Hematology and Oncology

## 2018-01-09 NOTE — Assessment & Plan Note (Signed)
62 y.o. female with progressive anemia of at least 3-year duration with chronic microcytosis and hypochromia.  Findings would suggest underlying iron deficiency, but iron panel obtained in Oct 2018 does not fully support the diagnosis based on ferritin of over 90.  Additional testing obtained during our last visit in the clinic shows no evidence of hemoglobinopathy.  Patient demonstrates stable iron levels which do not support diagnosis of iron deficiency at this time.  Overall, my conclusion is that patient has anemia of chronic disease with minimal contribution of anemia of iron deficiency at this time.  Plan: -Additional lab work today as outlined below. -Return to our clinic as needed, but we may place referral to rheumatology if any of the markers for rheumatoid arthritis or lupus become positive. -Additionally, patient is out of date for her cancer screening including mammogram and colonoscopy.  Both of those will be ordered

## 2018-01-09 NOTE — Progress Notes (Signed)
[facility] Follow-up Visit:  Assessment: Microcytic anemia 62 y.o. female with progressive anemia of at least 3-year duration with chronic microcytosis and hypochromia.  Findings would suggest underlying iron deficiency, but iron panel obtained in Oct 2018 does not fully support the diagnosis based on ferritin of over 90.  Additional testing obtained during our last visit in the clinic shows no evidence of hemoglobinopathy.  Patient demonstrates stable iron levels which do not support diagnosis of iron deficiency at this time.  Overall, my conclusion is that patient has anemia of chronic disease with minimal contribution of anemia of iron deficiency at this time.  Plan: -Additional lab work today as outlined below. -Return to our clinic as needed, but we may place referral to rheumatology if any of the markers for rheumatoid arthritis or lupus become positive. -Additionally, patient is out of date for her cancer screening including mammogram and colonoscopy.  Both of those will be ordered   Voice recognition software was used and creation of this note. Despite my best effort at editing the text, some misspelling/errors may have occurred.  Orders Placed This Encounter  Procedures  . C3 and C4    Standing Status:   Future    Number of Occurrences:   1    Standing Expiration Date:   12/29/2018  . C-reactive protein  . Sedimentation rate  . ANA, IFA (with reflex)    Standing Status:   Future    Number of Occurrences:   1    Standing Expiration Date:   12/28/2018  . Rheumatoid factor    Standing Status:   Future    Number of Occurrences:   1    Standing Expiration Date:   12/28/2018  . ANCA [place]    Standing Status:   Future    Number of Occurrences:   1    Standing Expiration Date:   12/28/2018    Cancer Staging No matching staging information was found for the patient.  All questions were answered.  . The patient knows to call the clinic with any problems,  questions or concerns.  This note was electronically signed.    History of Presenting Illness Amber Young is a 62 y.o. female followed in the [facility] for evaluation of iron deficiency anemia, referred by Dr. Herminio Commons.  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  Additionally, patient has paroxysmal atrial fibrillation, hypertension, coronary artery disease with history of coronary stenting in January 2018 and 2 additional stents placed in January 2019, history of total abdominal hysterectomy due to bleeding fibroids.  Patient is presently on warfarin, Brilinta, and has been taking ferrous sulfate 325 mg every other day for the past 3 weeks.  Patient denies any family history of sickle cell disease, anemia, or malignancy.  Returns to the clinic to review laboratory findings.  Patient denies any new complaints since last visit to the clinic.  Denies any fevers, chills, night sweats.  No rash, photosensitivity, or oral sores, denies alopecia, dysphasia, odynophagia.  Oncological/hematological History: --Labs, 02/03/13: Hgb 14.3 --Labs, 10/02/14: Hgb 11.3, MCV 76.0, MCH 24.4, MCHC 32.1, RDW 13.3; --Labs, 12/05/15: Hgb 10.7, MCV 73.8, MCH 24.6, MCHC 33.3, RDW 13.3; --Labs, 12/02/16: Hgb 11.9, MCV 73.6, MCH 24.0, MCHC 32.8, RDW 15.3; --Labs, 02/23/17: Hgb   9.5, MCV 78.3, MCH 24.3, MCHC 31.0, RDW 16.5;  --Labs, 05/24/17: Hgb   8.6,  MCV 76.3, MCH 24.2, MCHC 31.7, RDW 13.9; Fe 40, FeSat 13%, TIBC 308, Ferritin 92 --Labs, 09/06/17: Hgb   9.1, MCV 78.1, MCH 24.6, MCHC 31.5, RDW 14.9; --Labs, 09/22/17: Hgb   9.4, MCV 78.0, MCH 24.3, MCHC 31.2, RDW 14.9; --Labs, 11/17/17: Hgb   8.3, MCV 73.5, MCH 23.2, MCHC 31.6, RDW 14.3; --Labs, 12/13/17: Hgb   8.4, MCV 74.7, MCH 23.6, MCHC 31.6, RDW 16.0; Fe 38, FeSat 15%, TIBC 259, Ferritin 77, Transferrin 203,  sTFR 20.7, Cu 144, Ceruloplasmin 33; tProt 7.1, Alb 2.6, LDH 309; Hgb A 97.9%, Hgb A2 2.1%, no abnormal hemoglobin forms. --Labs, 12/21/17: SPEP -- no evidence of monoclonal gammopathy, polyclonal elevation of IgG and IgA noted; IgG 1654, IgA 423, IgM 59; kappa 66.6, lambda 30.3, KLR 2.20   No history exists.    Medical History: Past Medical History:  Diagnosis Date  . Anemia   . CHF (congestive heart failure) ([place])   . CKD (chronic kidney disease), stage III ([place])   . Coronary artery disease   . GERD (gastroesophageal reflux disease)   . Heart murmur   . Hyperlipidemia   . Hypertension   . Stroke ([place]) 09/2014   numbness left upper lip, finger tips on left hand; "resolved" (05/18/2016)  . Type II diabetes mellitus ([place])     Surgical History: Past Surgical History:  Procedure Laterality Date  . CARDIAC CATHETERIZATION N/A 05/18/2016   Procedure: Left Heart Cath and Coronary Angiography;  Surgeon: Jettie Booze, MD;  Location: [facility];  Service: Cardiovascular;  Laterality: N/A;  . CARDIAC CATHETERIZATION N/A 05/18/2016   Procedure: Coronary Stent Intervention;  Surgeon: Jettie Booze, MD;  Location: [facility];  Service: Cardiovascular;  Laterality: N/A;  . CORONARY ANGIOPLASTY    . CORONARY ANGIOPLASTY WITH STENT PLACEMENT    . CORONARY STENT INTERVENTION N/A 09/07/2017   Procedure: CORONARY STENT INTERVENTION;  Surgeon: Leonie Man, MD;  Location: [facility];  Service: Cardiovascular;  Laterality: N/A;  . EYE SURGERY    . LEFT HEART CATH AND CORONARY ANGIOGRAPHY N/A 09/07/2017   Procedure: LEFT HEART CATH AND CORONARY ANGIOGRAPHY;  Surgeon: Leonie Man, MD;  Location: [facility];  Service: Cardiovascular;  Laterality: N/A;  . TUBAL LIGATION  1980  . VAGINAL HYSTERECTOMY  1999   "partial; fibroids"    Family History: Family History  Problem Relation Age of Onset  . Diabetes Mother   . Hypertension Mother   . COPD Mother    . Heart failure Mother   . Hypertension Sister   . Hypertension Brother     Social History: Social History   Socioeconomic History  . Marital status: Married    Spouse name: Not on file  . Number of children: Not on file  . Years of education: 67  . Highest education level: Not on file  Occupational History  . Occupation: hairdresser  Social Needs  . Financial resource strain: Not on file  . Food insecurity:    Worry: Not on file    Inability: Not on file  . Transportation needs:    Medical: Not on file    Non-medical: Not on file  Tobacco Use  . Smoking status: Never Smoker  . Smokeless tobacco: Never Used  Substance and Sexual Activity  . Alcohol use: No    Alcohol/week: 0.0 oz  . Drug use: No  . Sexual activity: Yes  Lifestyle  . Physical activity:    Days per  week: Not on file    Minutes per session: Not on file  . Stress: Not on file  Relationships  . Social connections:    Talks on phone: Not on file    Gets together: Not on file    Attends religious service: Not on file    Active member of club or organization: Not on file    Attends meetings of clubs or organizations: Not on file    Relationship status: Not on file  . Intimate partner violence:    Fear of current or ex partner: Not on file    Emotionally abused: Not on file    Physically abused: Not on file    Forced sexual activity: Not on file  Other Topics Concern  . Not on file  Social History Narrative   Married, 2 children, hairdresser   Caffeine use- occasional tea or coffee   Right handed    Allergies: Allergies  Allergen Reactions  . Ace Inhibitors Cough    Medications:  Current Outpatient Medications  Medication Sig Dispense Refill  . amLODipine (NORVASC) 10 MG tablet Take 1 tablet (10 mg total) by mouth daily. 90 tablet 2  . atorvastatin (LIPITOR) 40 MG tablet Take 1 tablet (40 mg total) by mouth daily at 6 PM. 90 tablet 1  . carvedilol (COREG) 25 MG tablet Take 2 tablets (50  mg total) by mouth 2 (two) times daily. 60 tablet 3  . ferrous sulfate (FERROUSUL) 325 (65 FE) MG tablet Take 1 tablet (325 mg total) by mouth every other day. 90 tablet 3  . furosemide (LASIX) 40 MG tablet Take 1 tablet (40 mg total) by mouth daily. 90 tablet 3  . Glucose Blood (BLOOD GLUCOSE TEST STRIPS) STRP Use as directed (Patient not taking: Reported on 01/06/2018) 200 each 0  . hydrALAZINE (APRESOLINE) 50 MG tablet Take 1.5 tablets (75 mg total) by mouth 3 (three) times daily. (Patient taking differently: Take 50 mg by mouth 3 (three) times daily. ) 135 tablet 6  . insulin glargine (LANTUS) 100 UNIT/ML injection Inject 0.1 mLs (10 Units total) into the skin 2 (two) times daily. 20 mL 11  . insulin lispro (HUMALOG) 100 UNIT/ML injection Inject 0.05 mLs (5 Units total) into the skin 3 (three) times daily with meals. 30 mL 3  . Insulin Pen Needle 29G X 10MM MISC Use as directed (Patient not taking: Reported on 01/06/2018) 100 each 3  . INSULIN SYRINGE .5CC/29G 29G X 1/2" 0.5 ML MISC Use to administer insulin three times daily (Patient not taking: Reported on 01/06/2018) 200 each 5  . losartan (COZAAR) 50 MG tablet Take 1 tablet (50 mg total) by mouth 2 (two) times daily. 90 tablet 3  . nitroGLYCERIN (NITROSTAT) 0.4 MG SL tablet Place 1 tablet (0.4 mg total) under the tongue every 5 (five) minutes x 3 doses as needed for chest pain. 30 tablet 12  . potassium chloride SA (K-DUR,KLOR-CON) 10 MEQ tablet Take 1 tablet with each dose of furosemide, daily. (Patient taking differently: Take 10 mEq by mouth daily. ) 30 tablet 2  . ticagrelor (BRILINTA) 90 MG TABS tablet Take 1 tablet (90 mg total) by mouth 2 (two) times daily. 180 tablet 3  . warfarin (COUMADIN) 5 MG tablet Take 1 to 1.5 tablets by mouth daily as directed by coumadin clinic (Patient taking differently: Take 5-7.5 mg by mouth See admin instructions. Take 1 tablet (5 mg) on Sunday, Monday, Wednesday, Thursday and Saturday then take 1 and 1/2  tablets (7.5mg ) on Tuesday and Friday) 120 tablet 1   Current Facility-Administered Medications  Medication Dose Route Frequency Provider Last Rate Last Dose  . Bevacizumab (AVASTIN) SOLN 1.25 mg  1.25 mg Intravitreal  Bernarda Caffey, MD   1.25 mg at 10/20/17 1158  . Bevacizumab (AVASTIN) SOLN 1.25 mg  1.25 mg Intravitreal  Bernarda Caffey, MD   1.25 mg at 10/22/17 0827  . Bevacizumab (AVASTIN) SOLN 1.25 mg  1.25 mg Intravitreal  Bernarda Caffey, MD   1.25 mg at 11/22/17 1425  . Bevacizumab (AVASTIN) SOLN 1.25 mg  1.25 mg Intravitreal  Bernarda Caffey, MD   1.25 mg at 11/22/17 1425  . Bevacizumab (AVASTIN) SOLN 1.25 mg  1.25 mg Intravitreal  Bernarda Caffey, MD   1.25 mg at 12/20/17 1115  . Bevacizumab (AVASTIN) SOLN 1.25 mg  1.25 mg Intravitreal  Bernarda Caffey, MD   1.25 mg at 12/20/17 1116    Review of Systems: Review of Systems  Constitutional: Positive for fatigue.  All other systems reviewed and are negative.    PHYSICAL EXAMINATION Blood pressure (!) 179/77, pulse 86, temperature 98.6 F (37 C), temperature source Oral, resp. rate 18, height 5\' 6"  (1.676 m), weight 182 lb 3.2 oz (82.6 kg), SpO2 100 %.  ECOG PERFORMANCE STATUS: 0 - Asymptomatic  Physical Exam  Constitutional: She is oriented to person, place, and time. She appears well-developed and well-nourished. No distress.  HENT:  Head: Normocephalic and atraumatic.  Mouth/Throat: Oropharynx is clear and moist. No oropharyngeal exudate.  Eyes: Pupils are equal, round, and reactive to light. Conjunctivae and EOM are normal. No scleral icterus.  Neck: No thyromegaly present.  Cardiovascular: Normal rate, regular rhythm, normal heart sounds and intact distal pulses. Exam reveals no gallop and no friction rub.  No murmur heard. Pulmonary/Chest: Effort normal and breath sounds normal. No stridor. No respiratory distress. She has no wheezes. She has no rales.  Abdominal: Soft. Bowel sounds are normal. She exhibits no distension and no  mass. There is no tenderness. There is no guarding.  Musculoskeletal: She exhibits no edema.  Lymphadenopathy:    She has no cervical adenopathy.  Neurological: She is alert and oriented to person, place, and time. She displays normal reflexes. No cranial nerve deficit or sensory deficit.  Skin: Skin is warm and dry. No rash noted. She is not diaphoretic. No erythema. No pallor.     LABORATORY DATA: I have personally reviewed the data as listed: Appointment on 12/28/2017  Component Date Value Ref Range Status  . C3 Complement 12/28/2017 153  82 - 167 mg/dL Final  . Complement C4, Body Fluid 12/28/2017 37  14 - 44 mg/dL Final   Comment: (NOTE) Performed At: [facility] [place], [place] [id number] Rush Farmer MD [id number] Performed at [facility], [place] [address] [place], [place] [address]   . ANA Ab, IFA 12/28/2017 Negative   Final   Comment: (NOTE)                                     Negative   <1:80                                     Borderline  1:80  Positive   >1:80 Performed At: [facility] [place], [place] [id number] Rush Farmer MD [id number] Performed at [facility], [place] [address] [place], [place] [address]   . Rhuematoid fact SerPl-aCnc 12/28/2017 35.1* 0.0 - 13.9 IU/mL Final   Comment: (NOTE) Performed At: [facility] [place], [place] [id number] Rush Farmer MD [id number] Performed at [facility], [place] [address], [place], [place] [address]   . C-ANCA 12/28/2017 <1:20  Neg:<1:20 titer Final  . P-ANCA 12/28/2017 <1:20  Neg:<1:20 titer Final   Comment: (NOTE) The presence of positive fluorescence exhibiting P-ANCA or C-ANCA patterns alone is not specific for the diagnosis of Wegener's Granulomatosis (WG) or microscopic polyangiitis. Decisions about treatment  should not be based solely on ANCA IFA results.  The [employer] ANCA [employer] Consensus recommends follow up testing of positive sera with both PR-3 and MPO-ANCA enzyme immunoassays. As many as 5% serum samples are positive only by EIA. Ref. AM J Clin Pathol 1999;111:507-513.   Marland Kitchen Atypical P-ANCA titer 12/28/2017 <1:20  Neg:<1:20 titer Final   Comment: (NOTE) The atypical pANCA pattern has been observed in a significant percentage of patients with ulcerative colitis, primary sclerosing cholangitis and autoimmune hepatitis. Performed At: [facility] [place], [place] [id number] Rush Farmer MD [id number] Performed at [facility], [place] [address], [place], [place] [address]   Office Visit on 12/28/2017  Component Date Value Ref Range Status  . CRP 12/28/2017 1.0* <1.0 mg/dL Final   Performed at [facility] [address], [place], [place] [address]  . Sed Rate 12/28/2017 92* 0 - 22 mm/hr Final   Performed at [facility] [facility] [address] [place],  [address]  Anti-coag visit on 12/28/2017  Component Date Value Ref Range Status  . INR 12/28/2017 1.1* 2.0 - 3.0 Final       Ardath Sax, MD

## 2018-01-10 ENCOUNTER — Ambulatory Visit (HOSPITAL_COMMUNITY): Payer: BLUE CROSS/BLUE SHIELD

## 2018-01-10 ENCOUNTER — Telehealth: Payer: Self-pay | Admitting: Hematology and Oncology

## 2018-01-10 NOTE — Telephone Encounter (Signed)
Referral placed in [facility] to [facility] per 6/2 sch message -  Office to contact patient.

## 2018-01-10 NOTE — Progress Notes (Addendum)
[facility] Note  01/11/2018     CHIEF COMPLAINT Patient presents for Retina Follow Up   HISTORY OF PRESENT ILLNESS: Amber Young is a 62 y.o. female who presents to the clinic today for:   HPI    Retina Follow Up    Patient presents with  Other.  In both eyes.  Severity is severe.  Duration of 6 days.  Since onset it is stable.  I, the attending physician,  performed the HPI with the patient and updated documentation appropriately.          Comments    F/U PDR w/ DME OU; Pt states she feels OS VA has "lightened up a little bit"; Pt states OD VA is stable; Pt reports she thinks she needs to get her glasses changed; Pt reports having flashes OS "every once in a while"; Pt denies floaters, denies wavy VA, denies ocular pain;        Last edited by Bernarda Caffey, MD on 01/11/2018  9:08 AM. (History)    here today for pre op IVA OS  Referring physician: Scot Jun, [place], [place] [id number]  HISTORICAL INFORMATION:   Selected notes from the MEDICAL RECORD NUMBER Referred by Molli Barrows, FNP for DM exam;  LEE-  Ocular Hx-  PMH- DM (A1C - 9.4 (02.13.19)), CKD, HTN, CAD, CHF, hx of stroke    CURRENT MEDICATIONS: No current outpatient medications on file. (Ophthalmic Drugs)   No current facility-administered medications for this visit.  (Ophthalmic Drugs)   Current Outpatient Medications (Other)  Medication Sig  . amLODipine (NORVASC) 10 MG tablet Take 1 tablet (10 mg total) by mouth daily.  Marland Kitchen atorvastatin (LIPITOR) 40 MG tablet Take 1 tablet (40 mg total) by mouth daily at 6 PM.  . carvedilol (COREG) 25 MG tablet Take 2 tablets (50 mg total) by mouth 2 (two) times daily.  . ferrous sulfate (FERROUSUL) 325 (65 FE) MG tablet Take 1 tablet (325 mg total) by mouth every other day.  . furosemide (LASIX) 40 MG tablet Take 1 tablet (40 mg total) by mouth daily.  . Glucose Blood (BLOOD GLUCOSE TEST STRIPS) STRP Use as  directed (Patient not taking: Reported on 01/06/2018)  . hydrALAZINE (APRESOLINE) 50 MG tablet Take 1.5 tablets (75 mg total) by mouth 3 (three) times daily. (Patient taking differently: Take 50 mg by mouth 3 (three) times daily. )  . insulin glargine (LANTUS) 100 UNIT/ML injection Inject 0.1 mLs (10 Units total) into the skin 2 (two) times daily.  . insulin lispro (HUMALOG) 100 UNIT/ML injection Inject 0.05 mLs (5 Units total) into the skin 3 (three) times daily with meals.  . Insulin Pen Needle 29G X 10MM MISC Use as directed (Patient not taking: Reported on 01/06/2018)  . INSULIN SYRINGE .5CC/29G 29G X 1/2" 0.5 ML MISC Use to administer insulin three times daily (Patient not taking: Reported on 01/06/2018)  . losartan (COZAAR) 50 MG tablet Take 1 tablet (50 mg total) by mouth 2 (two) times daily.  . nitroGLYCERIN (NITROSTAT) 0.4 MG SL tablet Place 1 tablet (0.4 mg total) under the tongue every 5 (five) minutes x 3 doses as needed for chest pain.  . potassium chloride SA (K-DUR,KLOR-CON) 10 MEQ tablet Take 1 tablet with each dose of furosemide, daily. (Patient taking differently: Take 10 mEq by mouth daily. )  . ticagrelor (BRILINTA) 90 MG TABS tablet Take 1 tablet (90 mg total) by mouth 2 (two) times  daily.  . warfarin (COUMADIN) 5 MG tablet Take 1 to 1.5 tablets by mouth daily as directed by coumadin clinic (Patient taking differently: Take 5-7.5 mg by mouth See admin instructions. Take 1 tablet (5 mg) on Sunday, Monday, Wednesday, Thursday and Saturday then take 1 and 1/2 tablets (7.5mg ) on Tuesday and Friday)   Current Facility-Administered Medications (Other)  Medication Route  . Bevacizumab (AVASTIN) SOLN 1.25 mg Intravitreal  . Bevacizumab (AVASTIN) SOLN 1.25 mg Intravitreal  . Bevacizumab (AVASTIN) SOLN 1.25 mg Intravitreal  . Bevacizumab (AVASTIN) SOLN 1.25 mg Intravitreal  . Bevacizumab (AVASTIN) SOLN 1.25 mg Intravitreal  . Bevacizumab (AVASTIN) SOLN 1.25 mg Intravitreal  . Bevacizumab  (AVASTIN) SOLN 1.25 mg Intravitreal      REVIEW OF SYSTEMS: ROS    Positive for: Neurological, Endocrine, Cardiovascular, Eyes   Negative for: Constitutional, Gastrointestinal, Skin, Genitourinary, Musculoskeletal, HENT, Respiratory, Psychiatric, Allergic/Imm, Heme/Lymph   Last edited by Cherrie Gauze, COA on 01/11/2018  8:35 AM. (History)       ALLERGIES Allergies  Allergen Reactions  . Ace Inhibitors Cough    PAST MEDICAL HISTORY Past Medical History:  Diagnosis Date  . Anemia   . CHF (congestive heart failure) ([place])   . CKD (chronic kidney disease), stage III ([place])   . Coronary artery disease   . GERD (gastroesophageal reflux disease)   . Heart murmur   . Hyperlipidemia   . Hypertension   . Stroke ([place]) 09/2014   numbness left upper lip, finger tips on left hand; "resolved" (05/18/2016)  . Type II diabetes mellitus ([place])    Past Surgical History:  Procedure Laterality Date  . CARDIAC CATHETERIZATION N/A 05/18/2016   Procedure: Left Heart Cath and Coronary Angiography;  Surgeon: Jettie Booze, MD;  Location: [facility];  Service: Cardiovascular;  Laterality: N/A;  . CARDIAC CATHETERIZATION N/A 05/18/2016   Procedure: Coronary Stent Intervention;  Surgeon: Jettie Booze, MD;  Location: [facility];  Service: Cardiovascular;  Laterality: N/A;  . CORONARY ANGIOPLASTY    . CORONARY ANGIOPLASTY WITH STENT PLACEMENT    . CORONARY STENT INTERVENTION N/A 09/07/2017   Procedure: CORONARY STENT INTERVENTION;  Surgeon: Leonie Man, MD;  Location: [facility];  Service: Cardiovascular;  Laterality: N/A;  . EYE SURGERY    . LEFT HEART CATH AND CORONARY ANGIOGRAPHY N/A 09/07/2017   Procedure: LEFT HEART CATH AND CORONARY ANGIOGRAPHY;  Surgeon: Leonie Man, MD;  Location: [facility];  Service: Cardiovascular;  Laterality: N/A;  . TUBAL LIGATION  1980  . VAGINAL HYSTERECTOMY  1999   "partial; fibroids"    FAMILY HISTORY Family  History  Problem Relation Age of Onset  . Diabetes Mother   . Hypertension Mother   . COPD Mother   . Heart failure Mother   . Hypertension Sister   . Hypertension Brother     SOCIAL HISTORY Social History   Tobacco Use  . Smoking status: Never Smoker  . Smokeless tobacco: Never Used  Substance Use Topics  . Alcohol use: No    Alcohol/week: 0.0 oz  . Drug use: No         OPHTHALMIC EXAM:  Base Eye Exam    Visual Acuity (Snellen - Linear)      Right Left   Dist cc 20/60 20/100   Dist ph cc NI NI   Correction:  Glasses       Tonometry (Tonopen, 8:46 AM)      Right Left  Pressure 14 13       Pupils      Dark Light Shape React APD   Right 4 3 Round Slow None   Left 4 3 Round Slow None       Visual Fields (Counting fingers)      Left Right    Full Full       Extraocular Movement      Right Left    Full, Ortho Full, Ortho       Neuro/Psych    Oriented x3:  Yes   Mood/Affect:  Normal       Dilation    Left eye:  1.0% Mydriacyl, 2.5% Phenylephrine @ 8:46 AM        Slit Lamp and Fundus Exam    Slit Lamp Exam      Right Left   Lids/Lashes Dermatochalasis - upper lid, mild Meibomian gland dysfunction Dermatochalasis - upper lid, Meibomian gland dysfunction   Conjunctiva/Sclera White and quiet White and quiet   Cornea Arcus, 1+ Punctate epithelial erosions, 1+ Guttata Arcus, 2+ Punctate epithelial erosions, EBMD, trace Guttata   Anterior Chamber Deep and quiet Deep and quiet   Iris Round and dilated, No NVI Round and dilated, No NVI, persistent pupillary membrane from 0703 to 0130   Lens 2-3+ Nuclear sclerosis, 2+ Cortical cataract, Vacuoles, iridescent refractile opacities 2-3+ Nuclear sclerosis, 2+ Cortical cataract, Vacuoles, iridescent refractile opacities   Vitreous Vitreous syneresis, mild Asteroid hyalosis superiorly +RBC/diffuse VH - settling inferiorly, mild Asteroid hyalosis superior vitreous       Fundus Exam      Right Left   Disc  compact, Pink and Sharp hazy view, perivascular fibrosis at 0800 with regressing NVD, fibrosis overlying disc, Pink and Sharp   C/D Ratio 0.5 0.5   Macula Blunted foveal reflex, +edema, +DBH, +superior exudate, Microaneurysms, Intraretinal hemorrhage hazy view -- central concentration of VH, Exudates visible superotemporal to fovea   Vessels Tortuous, ? Early fine NVE +NVE w/ large preretinal heme inf nasal to disc, scattered fibrosis, +NVD   Periphery Attached, 360 DBH red VH clotting and settling inferiorly, concentration of VH over macula fibrosis nasal to disc, PRP scars nasal to disc, fibrosis w/ focal TRD superiorly          IMAGING AND PROCEDURES  Imaging and Procedures for 12/07/17  OCT, Retina - OU - Both Eyes       Right Eye Quality was borderline. Central Foveal Thickness: 607. Progression has been stable. Findings include abnormal foveal contour, intraretinal fluid, subretinal fluid, vitreomacular adhesion .   Left Eye Quality was poor. Central Foveal Thickness: (Unable to obtain). Findings include intraretinal fluid, vitreous traction, subretinal fluid (Interval improvement of vitreous opacities; focal TRD at 1200).   Notes *Images captured and stored on drive  Diagnosis / Impression:  Central DME OU OD: stable from prior OS: no view today  Clinical management:  See below  Abbreviations: NFP - Normal foveal profile. CME - cystoid macular edema. PED - pigment epithelial detachment. IRF - intraretinal fluid. SRF - subretinal fluid. EZ - ellipsoid zone. ERM - epiretinal membrane. ORA - outer retinal atrophy. ORT - outer retinal tubulation. SRHM - subretinal hyper-reflective material         Intravitreal Injection, Pharmacologic Agent - OS - Left Eye       Time Out 01/11/2018. 9:12 AM. Confirmed correct patient, procedure, site, and patient consented.   Anesthesia Topical anesthesia was used. Anesthetic medications included Lidocaine 2%, Proparacaine 0.5%.  Procedure Preparation included 5% betadine to ocular surface. A supplied needle was used.   Injection: 1.25 mg Bevacizumab 1.25mg /0.68ml   NDC: 42353-614-43    Lot: [id number]    Expiration Date: 03/02/2018   Route: Intravitreal   Site: Left Eye   Waste: 0 mg  Post-op Post injection exam found visual acuity of at least counting fingers. The patient tolerated the procedure well. There were no complications. The patient received written and verbal post procedure care education.                 ASSESSMENT/PLAN:    ICD-10-CM   1. Proliferative diabetic retinopathy of both eyes with macular edema associated with type 2 diabetes mellitus (HCC) P61.9509 Intravitreal Injection, Pharmacologic Agent - OS - Left Eye    Bevacizumab (AVASTIN) SOLN 1.25 mg  2. Retinal edema H35.81 OCT, Retina - OU - Both Eyes  3. Vitreous hemorrhage of left eye (HCC) H43.12   4. 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 Essential hypertension I10   6. Hypertensive retinopathy of both eyes H35.033   7. Combined forms of age-related cataract of both eyes H25.813     1,2. Proliferative diabetic retinopathy with diabetic macular edema, OU - exam shows significant IRH, macular edema, fine NVE OU; preretinal and vitreous heme OS -- slowly improving OU - FA from prior shows NVE OU - OCT confirms diabetic macular edema, OU -- DME with minimal improvement - s/p IVA #1 OS (03.13.19), #2 (04.15.19), #3 (05.13.19) - s/p IVA #1 OD (03.15.19), #2 (04.15.19), #3 (05.13.19) - s/p PRP OS (04.05.19), PRP fill-in OS (04.30.19); OD will need PRP eventually - here for pre-op IVA OS - VH persists with minimal improvement and now with TRD superiorly - scheduled for 25g PPV/MP/EL/Air vs Gas OS under general anesthesia for VH and TRD repair OS - RBA of procedure discussed, questions answered - informed consent obtained and signed - pt has completed pre-op clearance from cardiologist; pending PCP and Anesthesia team -  case scheduled for Thursday, 01/13/18 in [facility] OR 8 - f/u POD1  3. Vitreous hemorrhage OS - secondary to #1, above - s/p IVA OS as above - s/p PRP OS (04.05.19 and 04.30.19) -- incomplete due to [facility] - VH precautions reviewed -- minimize activities, keep head elevated, avoid ASA/NSAIDs/blood thinners as able - of note pt on warfarin - scheduled for PPV as above  4. Tractional retinal detachment, OS secondary to #1-3 - focal TRD superior midzone OS - scheduled for PPV as above  5,6. Hypertensive retinopathy OU - discussed importance of tight BP control - monitor  6. 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 - discussed diagnosis and progression - not yet visually significant - monitor for now   Ophthalmic Meds Ordered this visit:  Meds ordered this encounter  Medications  . Bevacizumab (AVASTIN) SOLN 1.25 mg       Return in about 3 days (around 01/14/2018) for POV.  There are no Patient Instructions on file for this visit.   Explained the diagnoses, plan, and follow up with the patient and they expressed understanding.  Patient expressed understanding of the importance of proper follow up care.   This document serves as a record of services personally performed by Gardiner Sleeper, MD, PhD. It was created on their behalf by Catha Brow, [place], a certified ophthalmic assistant. The creation of this record is the provider's dictation and/or activities  during the visit.  Electronically signed by: Catha Brow, COA  06.03.19 1:03 PM    Gardiner Sleeper, M.D., Ph.D. Diseases & Surgery of the Retina and Vitreous [facility]  I have reviewed the above documentation for accuracy and completeness, and I agree with the above. Gardiner Sleeper, M.D., Ph.D. 01/11/18 1:03 PM    Abbreviations: M myopia (nearsighted); A astigmatism; H hyperopia (farsighted); P  presbyopia; Mrx spectacle prescription;  CTL contact lenses; OD right eye; OS left eye; OU both eyes  XT exotropia; ET esotropia; PEK punctate epithelial keratitis; PEE punctate epithelial erosions; DES dry eye syndrome; MGD meibomian gland dysfunction; ATs artificial tears; PFAT's preservative free artificial tears; [place] nuclear sclerotic cataract; PSC posterior subcapsular cataract; ERM epi-retinal membrane; PVD posterior vitreous detachment; RD retinal detachment; DM diabetes mellitus; DR diabetic retinopathy; NPDR non-proliferative diabetic retinopathy; PDR proliferative diabetic retinopathy; CSME clinically significant macular edema; DME diabetic macular edema; dbh dot blot hemorrhages; CWS cotton wool spot; POAG primary open angle glaucoma; C/D cup-to-disc ratio; HVF humphrey visual field; GVF goldmann visual field; OCT optical coherence tomography; IOP intraocular pressure; BRVO Branch retinal vein occlusion; CRVO central retinal vein occlusion; CRAO central retinal artery occlusion; BRAO branch retinal artery occlusion; RT retinal tear; SB scleral buckle; PPV pars plana vitrectomy; VH Vitreous hemorrhage; PRP panretinal laser photocoagulation; IVK intravitreal kenalog; VMT vitreomacular traction; MH Macular hole;  NVD neovascularization of the disc; NVE neovascularization elsewhere; AREDS age related eye disease study; ARMD age related macular degeneration; POAG primary open angle glaucoma; EBMD epithelial/anterior basement membrane dystrophy; ACIOL anterior chamber intraocular lens; IOL intraocular lens; PCIOL posterior chamber intraocular lens; Phaco/IOL phacoemulsification with intraocular lens placement; [place] photorefractive keratectomy; LASIK laser assisted in situ keratomileusis; HTN hypertension; DM diabetes mellitus; COPD chronic obstructive pulmonary disease

## 2018-01-11 ENCOUNTER — Ambulatory Visit (INDEPENDENT_AMBULATORY_CARE_PROVIDER_SITE_OTHER): Payer: BLUE CROSS/BLUE SHIELD | Admitting: Ophthalmology

## 2018-01-11 ENCOUNTER — Telehealth: Payer: Self-pay

## 2018-01-11 ENCOUNTER — Encounter (INDEPENDENT_AMBULATORY_CARE_PROVIDER_SITE_OTHER): Payer: Self-pay | Admitting: Ophthalmology

## 2018-01-11 ENCOUNTER — Encounter: Payer: Self-pay | Admitting: Family Medicine

## 2018-01-11 DIAGNOSIS — H3581 Retinal edema: Secondary | ICD-10-CM | POA: Diagnosis not present

## 2018-01-11 DIAGNOSIS — H35033 Hypertensive retinopathy, bilateral: Secondary | ICD-10-CM

## 2018-01-11 DIAGNOSIS — H3342 Traction detachment of retina, left eye: Secondary | ICD-10-CM

## 2018-01-11 DIAGNOSIS — H25813 Combined forms of age-related cataract, bilateral: Secondary | ICD-10-CM

## 2018-01-11 DIAGNOSIS — E113513 Type 2 diabetes mellitus with proliferative diabetic retinopathy with macular edema, bilateral: Secondary | ICD-10-CM

## 2018-01-11 DIAGNOSIS — H4312 Vitreous hemorrhage, left eye: Secondary | ICD-10-CM

## 2018-01-11 DIAGNOSIS — I10 Essential (primary) hypertension: Secondary | ICD-10-CM

## 2018-01-11 MED ORDER — BEVACIZUMAB CHEMO INJECTION 1.25MG/0.05ML SYRINGE FOR KALEIDOSCOPE
1.2500 mg | INTRAVITREAL | Status: DC
  Administered 2018-01-11: 1.25 mg via INTRAVITREAL

## 2018-01-11 NOTE — Telephone Encounter (Signed)
Meredith from [facility] state that they have clearance from cardiology but they also need clearance from you as well.

## 2018-01-11 NOTE — Telephone Encounter (Signed)
-----   Message from Dorena Dew, [place] sent at 01/11/2018 11:31 AM EDT ----- Please inform retina and eye center that medical clearance letter has been forwarded to Dr. Bernarda Caffey through epic.  Also, letter is available to review under the letters tab on chart review.   Donia Pounds  MSN, FNP-C [facility] [address] [place], [place] [address] [phone]

## 2018-01-11 NOTE — Telephone Encounter (Signed)
Eye center has been notified

## 2018-01-12 ENCOUNTER — Other Ambulatory Visit: Payer: Self-pay

## 2018-01-12 ENCOUNTER — Encounter (HOSPITAL_COMMUNITY): Payer: Self-pay | Admitting: *Deleted

## 2018-01-12 ENCOUNTER — Ambulatory Visit (HOSPITAL_COMMUNITY): Payer: BLUE CROSS/BLUE SHIELD

## 2018-01-12 ENCOUNTER — Encounter (INDEPENDENT_AMBULATORY_CARE_PROVIDER_SITE_OTHER): Payer: BLUE CROSS/BLUE SHIELD | Admitting: Ophthalmology

## 2018-01-12 ENCOUNTER — Telehealth: Payer: Self-pay

## 2018-01-12 NOTE — Progress Notes (Signed)
Pt denies any acute cardiopulmonary issues. Pt under the care of Dr. Bronson Ing, Cardiology. Pt stated that her last dose of Coumadin was Sunday. Pt stated that she was not instructed to stop taking Brilinta. Pt stated that she was instructed to take only half of her Lantus insulin on DOS. Willeen Cass, NP, stated that pt should be instructed to take required BP medications due to hx of " Malignant Hypertension.."  Pt made aware to take 5 units of Lantus insulin tonight. Pt made aware to take 5 units of Lantus the morning of surgery if BG is > 70. Pt made aware to check BG every 2 hours prior to arrival to hospital on DOS. Pt made aware to treat a BG < 70 with 4 ounces of apple  juice, wait 15 minutes after intervention to recheck BG, if BG remains < 70, call Short Stay unit to speak with a nurse. Pt made aware to stop taking vitamins, fish oil and herbal medications. Pt stated that she does not take NSAIDS or Aspirin. Pt verbalized understanding of all pre-op instructions. Anesthesia asked to review pt history; clearance in " Media."

## 2018-01-12 NOTE — Telephone Encounter (Signed)
Spoke with patient regarding medication.

## 2018-01-12 NOTE — Progress Notes (Signed)
Anesthesia Chart Review:  Pt is a same day work up    Case:  [id number] Date/Time:  01/13/18 1115   Procedures:      PARS PLANA VITRECTOMY WITH 25 GAUGE (Left )     MEMBRANE PEEL (Left )   Anesthesia type:  General   Pre-op diagnosis:  vitreous hemorrhage with tractional retinal detachment, left eye   Location:  [facility] OR ROOM 08 / [facility]   Surgeon:  Bernarda Caffey, MD      DISCUSSION: - Pt is a 62 year old female with hx CAD (DES x2 to RCA  09/07/17; prior stent to LAD), chronic combined systolic/diastolic CHF, PAF, malignant HTN, uncontrolled DM, anemia of chronic disease  - Pt to hold coumadin 5 days before surgery and to continue brilinta perioperatively (due to DES x2 09/07/17)  - ED visit 11/17/17 for SOB; thought to be due to mild viral syndrome in setting of chronic vascular congestion from CHF. She was given lasix in ED. She felt better   PROVIDERS: PCP is Azzie Glatter, FNP. Pt cleared for surgery by Cammie Sickle, NP on 01/11/18 Cardiologist is Kate Sable, MD who cleared pt for surgery (in media tab).  Hematologist is Grace Isaac, MD. Last office visit 01/09/18 for anemia of chronic disease; f/u prn recommended   LABS: Will be obtained day of surgery including PT/INR - HbA1c was 10.6 on 12/15/17   IMAGES:  CXR 11/17/17: No acute cardiopulmonary process.   EKG 11/17/17: NSR. Nonspecific ST and T wave abnormality. Prolonged QT   CV:  Echo 09/08/17:  - Left ventricle: The cavity size was mildly dilated. There was severe concentric hypertrophy. Systolic function was mildly to moderately reduced. The estimated ejection fraction was in the range of 40% to 45%. Mild diffuse hypokinesis with distinct regional wall motion abnormalities. There is akinesis of the midanteroseptal and inferoseptal myocardium. There is akinesis of the apicalinferior myocardium. There was a reduced contribution of atrial contraction to ventricular filling, due to increased ventricular diastolic  pressure or atrial contractile dysfunction. Doppler parameters are consistent with a reversible restrictive pattern, indicative of decreased left ventricular diastolic compliance and/or increased left atrial pressure (grade 3 diastolic dysfunction). Doppler parameters are consistent with high ventricular filling pressure. - Mitral valve: There was mild regurgitation. - Left atrium: The atrium was mildly to moderately dilated. - Right atrium: The atrium was mildly dilated. - Pulmonary arteries: PA peak pressure: 43 mm Hg (S). - Inferior vena cava: The vessel was mildly dilated. - Impressions: The right ventricular systolic pressure was increased consistent with moderate pulmonary hypertension.  Cardiac cath 09/07/17:   Prox RCA lesion is 75% stenosed.  A drug-eluting stent was successfully placed using a STENT PROMUS PREM MR 2.75X16.  Post intervention, there is a 5% residual stenosis.  Ost RCA lesion is 85% stenosed.  A drug-eluting stent was successfully placed using a STENT PROMUS PREM MR 3.0X12. Overlaps Stent 1 proximally.  Post intervention, there is a 0% residual stenosis.  Mid RCA-1 lesion is 30% stenosed. Mid RCA-2 lesion is 25% stenosed.  Ost 1st Diag lesion is 30% stenosed. Ost 2nd Diag to 2nd Diag lesion is 80% stenosed. Stable.  Prox LAD lesion is 40% stenosed. Previously placed Mid LAD Promus drug eluting stent is widely patent.  There is mild left ventricular systolic dysfunction. The left ventricular ejection fraction is 45-50% by visual estimate.  LV end diastolic pressure is mildly elevated. - Severe Ostial & proximal RCA stenosis (progression of disease from Oct  2017).  - Successful PCI using 2 overlapping DES.  Difficult, ostial lesion PCI requiring multiple balloons, buddy wire & extra support wire.   Past Medical History:  Diagnosis Date  . Anemia   . CHF (congestive heart failure) ([place])   . CKD (chronic kidney disease), stage III ([place])   . Coronary artery  disease   . GERD (gastroesophageal reflux disease)   . Heart murmur   . Hyperlipidemia   . Hypertension   . Stroke ([place]) 09/2014   numbness left upper lip, finger tips on left hand; "resolved" (05/18/2016)  . Type II diabetes mellitus ([place])     Past Surgical History:  Procedure Laterality Date  . CARDIAC CATHETERIZATION N/A 05/18/2016   Procedure: Left Heart Cath and Coronary Angiography;  Surgeon: Jettie Booze, MD;  Location: [facility];  Service: Cardiovascular;  Laterality: N/A;  . CARDIAC CATHETERIZATION N/A 05/18/2016   Procedure: Coronary Stent Intervention;  Surgeon: Jettie Booze, MD;  Location: [facility];  Service: Cardiovascular;  Laterality: N/A;  . CORONARY ANGIOPLASTY    . CORONARY ANGIOPLASTY WITH STENT PLACEMENT    . CORONARY STENT INTERVENTION N/A 09/07/2017   Procedure: CORONARY STENT INTERVENTION;  Surgeon: Leonie Man, MD;  Location: [facility];  Service: Cardiovascular;  Laterality: N/A;  . EYE SURGERY    . LEFT HEART CATH AND CORONARY ANGIOGRAPHY N/A 09/07/2017   Procedure: LEFT HEART CATH AND CORONARY ANGIOGRAPHY;  Surgeon: Leonie Man, MD;  Location: [facility];  Service: Cardiovascular;  Laterality: N/A;  . TUBAL LIGATION  1980  . VAGINAL HYSTERECTOMY  1999   "partial; fibroids"    MEDICATIONS: . Bevacizumab (AVASTIN) SOLN 1.25 mg  . Bevacizumab (AVASTIN) SOLN 1.25 mg  . Bevacizumab (AVASTIN) SOLN 1.25 mg  . Bevacizumab (AVASTIN) SOLN 1.25 mg  . Bevacizumab (AVASTIN) SOLN 1.25 mg  . Bevacizumab (AVASTIN) SOLN 1.25 mg  . Bevacizumab (AVASTIN) SOLN 1.25 mg   . amLODipine (NORVASC) 10 MG tablet  . atorvastatin (LIPITOR) 40 MG tablet  . carvedilol (COREG) 25 MG tablet  . ferrous sulfate (FERROUSUL) 325 (65 FE) MG tablet  . furosemide (LASIX) 40 MG tablet  . hydrALAZINE (APRESOLINE) 50 MG tablet  . insulin glargine (LANTUS) 100 UNIT/ML injection  . insulin lispro (HUMALOG) 100 UNIT/ML injection  .  losartan (COZAAR) 50 MG tablet  . nitroGLYCERIN (NITROSTAT) 0.4 MG SL tablet  . potassium chloride SA (K-DUR,KLOR-CON) 10 MEQ tablet  . ticagrelor (BRILINTA) 90 MG TABS tablet  . warfarin (COUMADIN) 5 MG tablet  . Glucose Blood (BLOOD GLUCOSE TEST STRIPS) STRP  . Insulin Pen Needle 29G X 10MM MISC  . INSULIN SYRINGE .5CC/29G 29G X 1/2" 0.5 ML MISC   - Pt to hold coumadin 5 days before surgery and to continue brilinta perioperatively (due to DES x2 09/07/17)   If labs acceptable day of surgery, I anticipate pt can proceed with surgery as scheduled.  Willeen Cass, FNP-BC [facility] Short Stay Surgical Center/Anesthesiology Phone: [phone] 01/12/2018 1:26 PM

## 2018-01-13 ENCOUNTER — Ambulatory Visit (HOSPITAL_COMMUNITY): Payer: BLUE CROSS/BLUE SHIELD

## 2018-01-13 ENCOUNTER — Ambulatory Visit (HOSPITAL_COMMUNITY)
Admission: RE | Admit: 2018-01-13 | Discharge: 2018-01-13 | Disposition: A | Discharge status: Home / Self Care | Payer: BLUE CROSS/BLUE SHIELD | Source: Ambulatory Visit | Attending: Ophthalmology | Admitting: Ophthalmology

## 2018-01-13 ENCOUNTER — Ambulatory Visit (HOSPITAL_COMMUNITY): Payer: BLUE CROSS/BLUE SHIELD | Admitting: Emergency Medicine

## 2018-01-13 ENCOUNTER — Encounter (HOSPITAL_COMMUNITY)
Admission: RE | Disposition: A | Discharge status: Home / Self Care | Payer: Self-pay | Source: Ambulatory Visit | Attending: Ophthalmology

## 2018-01-13 ENCOUNTER — Encounter (HOSPITAL_COMMUNITY): Payer: Self-pay | Admitting: *Deleted

## 2018-01-13 DIAGNOSIS — N183 Chronic kidney disease, stage 3 (moderate): Secondary | ICD-10-CM | POA: Diagnosis not present

## 2018-01-13 DIAGNOSIS — Z419 Encounter for procedure for purposes other than remedying health state, unspecified: Secondary | ICD-10-CM

## 2018-01-13 DIAGNOSIS — I130 Hypertensive heart and chronic kidney disease with heart failure and stage 1 through stage 4 chronic kidney disease, or unspecified chronic kidney disease: Secondary | ICD-10-CM | POA: Insufficient documentation

## 2018-01-13 DIAGNOSIS — E113592 Type 2 diabetes mellitus with proliferative diabetic retinopathy without macular edema, left eye: Secondary | ICD-10-CM | POA: Diagnosis not present

## 2018-01-13 DIAGNOSIS — H4312 Vitreous hemorrhage, left eye: Secondary | ICD-10-CM | POA: Insufficient documentation

## 2018-01-13 DIAGNOSIS — K219 Gastro-esophageal reflux disease without esophagitis: Secondary | ICD-10-CM | POA: Diagnosis not present

## 2018-01-13 DIAGNOSIS — E1122 Type 2 diabetes mellitus with diabetic chronic kidney disease: Secondary | ICD-10-CM | POA: Insufficient documentation

## 2018-01-13 DIAGNOSIS — Z794 Long term (current) use of insulin: Secondary | ICD-10-CM | POA: Insufficient documentation

## 2018-01-13 DIAGNOSIS — I480 Paroxysmal atrial fibrillation: Secondary | ICD-10-CM | POA: Diagnosis not present

## 2018-01-13 DIAGNOSIS — I509 Heart failure, unspecified: Secondary | ICD-10-CM | POA: Insufficient documentation

## 2018-01-13 DIAGNOSIS — E113532 Type 2 diabetes mellitus with proliferative diabetic retinopathy with traction retinal detachment not involving the macula, left eye: Secondary | ICD-10-CM | POA: Insufficient documentation

## 2018-01-13 DIAGNOSIS — E785 Hyperlipidemia, unspecified: Secondary | ICD-10-CM | POA: Insufficient documentation

## 2018-01-13 DIAGNOSIS — Z79899 Other long term (current) drug therapy: Secondary | ICD-10-CM | POA: Diagnosis not present

## 2018-01-13 DIAGNOSIS — H3342 Traction detachment of retina, left eye: Secondary | ICD-10-CM | POA: Diagnosis not present

## 2018-01-13 HISTORY — PX: PARS PLANA VITRECTOMY: SHX2166

## 2018-01-13 HISTORY — PX: GAS INSERTION: SHX5336

## 2018-01-13 HISTORY — DX: Type 2 diabetes mellitus with proliferative diabetic retinopathy without macular edema, unspecified eye: E11.3599

## 2018-01-13 HISTORY — DX: Presence of spectacles and contact lenses: Z97.3

## 2018-01-13 HISTORY — PX: MEMBRANE PEEL: SHX5967

## 2018-01-13 LAB — GLUCOSE, CAPILLARY
GLUCOSE-CAPILLARY: 96 mg/dL (ref 65–99)
GLUCOSE-CAPILLARY: 69 mg/dL (ref 65–99)
GLUCOSE-CAPILLARY: 118 mg/dL — AB (ref 65–99)
GLUCOSE-CAPILLARY: 97 mg/dL (ref 65–99)

## 2018-01-13 LAB — BASIC METABOLIC PANEL
ANION GAP: 7 (ref 5–15)
BUN: 25 mg/dL — ABNORMAL HIGH (ref 6–20)
CALCIUM: 8.4 mg/dL — AB (ref 8.9–10.3)
CO2: 22 mmol/L (ref 22–32)
Chloride: 113 mmol/L — ABNORMAL HIGH (ref 101–111)
Creatinine, Ser: 1.74 mg/dL — ABNORMAL HIGH (ref 0.44–1.00)
GFR, EST AFRICAN AMERICAN: 35 mL/min — AB (ref >60)
GFR, EST NON AFRICAN AMERICAN: 30 mL/min — AB (ref >60)
Glucose, Bld: 83 mg/dL (ref 65–99)
POTASSIUM: 3.9 mmol/L (ref 3.5–5.1)
Sodium: 142 mmol/L (ref 135–145)

## 2018-01-13 LAB — CBC
HEMATOCRIT: 24.9 % — AB (ref 36.0–46.0)
Hemoglobin: 7.7 g/dL — ABNORMAL LOW (ref 12.0–15.0)
MCH: 23.4 pg — ABNORMAL LOW (ref 26.0–34.0)
MCHC: 30.9 g/dL (ref 30.0–36.0)
MCV: 75.7 fL — ABNORMAL LOW (ref 78.0–100.0)
Platelets: 319 10*3/uL (ref 150–400)
RBC: 3.29 MIL/uL — AB (ref 3.87–5.11)
RDW: 16.3 % — AB (ref 11.5–15.5)
WBC: 5 10*3/uL (ref 4.0–10.5)

## 2018-01-13 LAB — PROTIME-INR
INR: 1.13
Prothrombin Time: 14.4 seconds (ref 11.4–15.2)

## 2018-01-13 SURGERY — PARS PLANA VITRECTOMY WITH 25 GAUGE
Anesthesia: General | Site: Eye | Laterality: Left

## 2018-01-13 MED ORDER — ACETAZOLAMIDE SODIUM 500 MG IJ SOLR
INTRAMUSCULAR | Status: AC
  Filled 2018-01-13: qty 500

## 2018-01-13 MED ORDER — 0.9 % SODIUM CHLORIDE (POUR BTL) OPTIME
TOPICAL | Status: DC | PRN
  Administered 2018-01-13: 1000 mL

## 2018-01-13 MED ORDER — DEXAMETHASONE SODIUM PHOSPHATE 10 MG/ML IJ SOLN
INTRAMUSCULAR | Status: AC
  Filled 2018-01-13: qty 1

## 2018-01-13 MED ORDER — LACTATED RINGERS IV SOLN
INTRAVENOUS | Status: DC | PRN
  Administered 2018-01-13: 12:00:00 via INTRAVENOUS

## 2018-01-13 MED ORDER — EPINEPHRINE PF 1 MG/ML IJ SOLN
INTRAMUSCULAR | Status: AC
  Filled 2018-01-13: qty 1

## 2018-01-13 MED ORDER — NA CHONDROIT SULF-NA HYALURON 40-30 MG/ML IO SOLN
INTRAOCULAR | Status: DC | PRN
  Administered 2018-01-13: 0.75 mL via INTRAOCULAR

## 2018-01-13 MED ORDER — ATROPINE SULFATE 1 % OP SOLN
1.0000 [drp] | OPHTHALMIC | Status: AC | PRN
  Administered 2018-01-13 (×3): 1 [drp] via OPHTHALMIC
  Filled 2018-01-13: qty 5

## 2018-01-13 MED ORDER — TOBRAMYCIN-DEXAMETHASONE 0.3-0.1 % OP OINT
TOPICAL_OINTMENT | OPHTHALMIC | Status: AC
  Filled 2018-01-13: qty 3.5

## 2018-01-13 MED ORDER — BSS PLUS IO SOLN
INTRAOCULAR | Status: AC
  Filled 2018-01-13: qty 500

## 2018-01-13 MED ORDER — LIDOCAINE HCL (CARDIAC) PF 100 MG/5ML IV SOSY
PREFILLED_SYRINGE | INTRAVENOUS | Status: DC | PRN
  Administered 2018-01-13: 100 mg via INTRAVENOUS

## 2018-01-13 MED ORDER — ROCURONIUM BROMIDE 100 MG/10ML IV SOLN
INTRAVENOUS | Status: DC | PRN
  Administered 2018-01-13: 40 mg via INTRAVENOUS

## 2018-01-13 MED ORDER — SUGAMMADEX SODIUM 200 MG/2ML IV SOLN
INTRAVENOUS | Status: AC
  Filled 2018-01-13: qty 2

## 2018-01-13 MED ORDER — SUGAMMADEX SODIUM 200 MG/2ML IV SOLN
INTRAVENOUS | Status: DC | PRN
  Administered 2018-01-13: 160 mg via INTRAVENOUS

## 2018-01-13 MED ORDER — PREDNISOLONE ACETATE 1 % OP SUSP
OPHTHALMIC | Status: AC
  Filled 2018-01-13: qty 5

## 2018-01-13 MED ORDER — MEPERIDINE HCL 50 MG/ML IJ SOLN: 6.2500 mg | INTRAMUSCULAR | Status: DC | PRN

## 2018-01-13 MED ORDER — LIDOCAINE HCL (PF) 4 % IJ SOLN
INTRAMUSCULAR | Status: AC
Start: 2018-01-13 — End: ?
  Filled 2018-01-13: qty 5

## 2018-01-13 MED ORDER — LIDOCAINE 2% (20 MG/ML) 5 ML SYRINGE
INTRAMUSCULAR | Status: AC
  Filled 2018-01-13: qty 5

## 2018-01-13 MED ORDER — ONDANSETRON HCL 4 MG/2ML IJ SOLN
INTRAMUSCULAR | Status: AC
  Filled 2018-01-13: qty 2

## 2018-01-13 MED ORDER — OXYCODONE HCL 5 MG PO TABS: 5.0000 mg | ORAL_TABLET | Freq: Once | ORAL | Status: DC | PRN

## 2018-01-13 MED ORDER — EPINEPHRINE PF 1 MG/ML IJ SOLN
INTRAMUSCULAR | Status: DC | PRN
  Administered 2018-01-13: .3 mL

## 2018-01-13 MED ORDER — TROPICAMIDE 1 % OP SOLN
1.0000 [drp] | OPHTHALMIC | Status: AC | PRN
  Administered 2018-01-13 (×3): 1 [drp] via OPHTHALMIC
  Filled 2018-01-13: qty 15

## 2018-01-13 MED ORDER — MIDAZOLAM HCL 2 MG/2ML IJ SOLN
INTRAMUSCULAR | Status: AC
  Filled 2018-01-13: qty 2

## 2018-01-13 MED ORDER — FENTANYL CITRATE (PF) 250 MCG/5ML IJ SOLN
INTRAMUSCULAR | Status: AC
  Filled 2018-01-13: qty 5

## 2018-01-13 MED ORDER — NA CHONDROIT SULF-NA HYALURON 40-30 MG/ML IO SOLN
INTRAOCULAR | Status: AC
  Filled 2018-01-13: qty 1

## 2018-01-13 MED ORDER — INDOCYANINE GREEN 25 MG IV SOLR
INTRAVENOUS | Status: AC
  Filled 2018-01-13: qty 25

## 2018-01-13 MED ORDER — DORZOLAMIDE HCL-TIMOLOL MAL 2-0.5 % OP SOLN
OPHTHALMIC | Status: AC
  Filled 2018-01-13: qty 10

## 2018-01-13 MED ORDER — DORZOLAMIDE HCL-TIMOLOL MAL 2-0.5 % OP SOLN
OPHTHALMIC | Status: DC | PRN
  Administered 2018-01-13: 1 [drp] via OPHTHALMIC

## 2018-01-13 MED ORDER — GATIFLOXACIN 0.5 % OP SOLN OPTIME - NO CHARGE
OPHTHALMIC | Status: DC | PRN
  Administered 2018-01-13: 1 [drp] via OPHTHALMIC

## 2018-01-13 MED ORDER — FENTANYL CITRATE (PF) 100 MCG/2ML IJ SOLN: 25.0000 ug | INTRAMUSCULAR | Status: DC | PRN

## 2018-01-13 MED ORDER — MIDAZOLAM HCL 5 MG/5ML IJ SOLN
INTRAMUSCULAR | Status: DC | PRN
  Administered 2018-01-13 (×2): 1 mg via INTRAVENOUS

## 2018-01-13 MED ORDER — OXYCODONE HCL 5 MG/5ML PO SOLN: 5.0000 mg | Freq: Once | ORAL | Status: DC | PRN

## 2018-01-13 MED ORDER — BRIMONIDINE TARTRATE 0.2 % OP SOLN
OPHTHALMIC | Status: AC
  Filled 2018-01-13: qty 5

## 2018-01-13 MED ORDER — SODIUM CHLORIDE 0.9 % IV SOLN
INTRAVENOUS | Status: DC
  Administered 2018-01-13 (×2): via INTRAVENOUS

## 2018-01-13 MED ORDER — GATIFLOXACIN 0.5 % OP SOLN
OPHTHALMIC | Status: AC
  Filled 2018-01-13: qty 2.5

## 2018-01-13 MED ORDER — EPHEDRINE SULFATE 50 MG/ML IJ SOLN
INTRAMUSCULAR | Status: AC
  Filled 2018-01-13: qty 1

## 2018-01-13 MED ORDER — DEXTROSE 50 % IV SOLN
25.0000 mL | Freq: Once | INTRAVENOUS | Status: AC
  Administered 2018-01-13: 25 mL via INTRAVENOUS

## 2018-01-13 MED ORDER — DEXAMETHASONE SODIUM PHOSPHATE 10 MG/ML IJ SOLN
INTRAMUSCULAR | Status: DC | PRN
  Administered 2018-01-13: 5 mg via INTRAVENOUS

## 2018-01-13 MED ORDER — CARBACHOL 0.01 % IO SOLN
INTRAOCULAR | Status: AC
  Filled 2018-01-13: qty 1.5

## 2018-01-13 MED ORDER — ONDANSETRON HCL 4 MG/2ML IJ SOLN: 4.0000 mg | Freq: Once | INTRAMUSCULAR | Status: DC | PRN

## 2018-01-13 MED ORDER — BRIMONIDINE TARTRATE 0.2 % OP SOLN
OPHTHALMIC | Status: DC | PRN
  Administered 2018-01-13: 1 [drp] via OPHTHALMIC

## 2018-01-13 MED ORDER — CEFTAZIDIME 1 G IJ SOLR
INTRAMUSCULAR | Status: AC
  Filled 2018-01-13: qty 1

## 2018-01-13 MED ORDER — PHENYLEPHRINE 40 MCG/ML (10ML) SYRINGE FOR IV PUSH (FOR BLOOD PRESSURE SUPPORT)
PREFILLED_SYRINGE | INTRAVENOUS | Status: AC
  Filled 2018-01-13: qty 10

## 2018-01-13 MED ORDER — BSS PLUS IO SOLN
INTRAOCULAR | Status: DC | PRN
  Administered 2018-01-13 (×2): 1 via INTRAOCULAR

## 2018-01-13 MED ORDER — ACETAMINOPHEN 160 MG/5ML PO SOLN: 325.0000 mg | ORAL | Status: DC | PRN

## 2018-01-13 MED ORDER — PREDNISOLONE ACETATE 1 % OP SUSP
OPHTHALMIC | Status: DC | PRN
  Administered 2018-01-13: 1 [drp] via OPHTHALMIC

## 2018-01-13 MED ORDER — ATROPINE SULFATE 1 % OP SOLN
OPHTHALMIC | Status: DC | PRN
  Administered 2018-01-13: 1 [drp] via OPHTHALMIC

## 2018-01-13 MED ORDER — TRIAMCINOLONE ACETONIDE 40 MG/ML IJ SUSP
INTRAMUSCULAR | Status: DC | PRN
  Administered 2018-01-13: 40 mg via INTRAMUSCULAR

## 2018-01-13 MED ORDER — TRIAMCINOLONE ACETONIDE 40 MG/ML IJ SUSP
INTRAMUSCULAR | Status: AC
  Filled 2018-01-13: qty 5

## 2018-01-13 MED ORDER — ACETAMINOPHEN 325 MG PO TABS: 325.0000 mg | ORAL_TABLET | ORAL | Status: DC | PRN

## 2018-01-13 MED ORDER — ATROPINE SULFATE 1 % OP SOLN
OPHTHALMIC | Status: AC
  Filled 2018-01-13: qty 5

## 2018-01-13 MED ORDER — LIDOCAINE HCL (PF) 2 % IJ SOLN
INTRAMUSCULAR | Status: AC
  Filled 2018-01-13: qty 10

## 2018-01-13 MED ORDER — DEXTROSE 50 % IV SOLN
INTRAVENOUS | Status: AC
  Filled 2018-01-13: qty 50

## 2018-01-13 MED ORDER — PROPOFOL 10 MG/ML IV BOLUS
INTRAVENOUS | Status: DC | PRN
  Administered 2018-01-13: 120 mg via INTRAVENOUS

## 2018-01-13 MED ORDER — BSS IO SOLN
INTRAOCULAR | Status: AC
  Filled 2018-01-13: qty 15

## 2018-01-13 MED ORDER — STERILE WATER FOR INJECTION IJ SOLN
INTRAMUSCULAR | Status: DC | PRN
  Administered 2018-01-13: 1000 mL

## 2018-01-13 MED ORDER — POLYMYXIN B SULFATE 500000 UNITS IJ SOLR
INTRAMUSCULAR | Status: AC
  Filled 2018-01-13: qty 500000

## 2018-01-13 MED ORDER — PHENYLEPHRINE HCL 10 % OP SOLN
1.0000 [drp] | OPHTHALMIC | Status: AC | PRN
  Administered 2018-01-13 (×3): 1 [drp] via OPHTHALMIC
  Filled 2018-01-13: qty 5

## 2018-01-13 MED ORDER — ROCURONIUM BROMIDE 10 MG/ML (PF) SYRINGE
PREFILLED_SYRINGE | INTRAVENOUS | Status: AC
  Filled 2018-01-13: qty 5

## 2018-01-13 MED ORDER — FENTANYL CITRATE (PF) 100 MCG/2ML IJ SOLN
INTRAMUSCULAR | Status: DC | PRN
  Administered 2018-01-13: 50 ug via INTRAVENOUS

## 2018-01-13 MED ORDER — STERILE WATER FOR INJECTION IJ SOLN
INTRAMUSCULAR | Status: DC | PRN
  Administered 2018-01-13: 20 mL

## 2018-01-13 MED ORDER — SODIUM CHLORIDE 0.9 % IJ SOLN
INTRAMUSCULAR | Status: AC
  Filled 2018-01-13: qty 10

## 2018-01-13 MED ORDER — BACITRACIN-POLYMYXIN B 500-10000 UNIT/GM OP OINT
TOPICAL_OINTMENT | OPHTHALMIC | Status: AC
  Filled 2018-01-13: qty 3.5

## 2018-01-13 MED ORDER — BUPIVACAINE HCL (PF) 0.75 % IJ SOLN
INTRAMUSCULAR | Status: AC
  Filled 2018-01-13: qty 10

## 2018-01-13 MED ORDER — PROPARACAINE HCL 0.5 % OP SOLN
1.0000 [drp] | OPHTHALMIC | Status: AC | PRN
  Administered 2018-01-13 (×3): 1 [drp] via OPHTHALMIC
  Filled 2018-01-13: qty 15

## 2018-01-13 MED ORDER — STERILE WATER FOR INJECTION IJ SOLN
INTRAMUSCULAR | Status: AC
  Filled 2018-01-13: qty 10

## 2018-01-13 MED ORDER — BACITRACIN-POLYMYXIN B 500-10000 UNIT/GM OP OINT
TOPICAL_OINTMENT | OPHTHALMIC | Status: DC | PRN
  Administered 2018-01-13: 1 via OPHTHALMIC

## 2018-01-13 SURGICAL SUPPLY — 64 items
APPLICATOR COTTON TIP 6IN STRL (MISCELLANEOUS) ×12 IMPLANT
APPLICATOR DR MATTHEWS STRL (MISCELLANEOUS) ×16 IMPLANT
BANDAGE EYE OVAL (MISCELLANEOUS) ×4 IMPLANT
BLADE EYE CATARACT 19 1.4 BEAV (BLADE) IMPLANT
CABLE BIPOLOR RESECTION CORD (MISCELLANEOUS) ×2 IMPLANT
CANNULA ANT CHAM MAIN (OPHTHALMIC RELATED) IMPLANT
CANNULA FLEX TIP 25G (CANNULA) ×2 IMPLANT
CANNULA TROCAR 23 GA VLV (OPHTHALMIC) IMPLANT
CANNULA VLV SOFT TIP 25GA (OPHTHALMIC) ×2 IMPLANT
CLSR STERI-STRIP ANTIMIC 1/2X4 (GAUZE/BANDAGES/DRESSINGS) ×2 IMPLANT
COTTONBALL LRG STERILE PKG (GAUZE/BANDAGES/DRESSINGS) ×6 IMPLANT
DRAPE MICROSCOPE LEICA 46X105 (MISCELLANEOUS) ×2 IMPLANT
DRAPE OPHTHALMIC 77X100 STRL (CUSTOM PROCEDURE TRAY) ×2 IMPLANT
ERASER HMR WETFIELD 23G BP (MISCELLANEOUS) IMPLANT
FILTER BLUE MILLIPORE (MISCELLANEOUS) IMPLANT
FILTER STRAW FLUID ASPIR (MISCELLANEOUS) IMPLANT
FORCEPS GRIESHABER ILM 25G A (INSTRUMENTS) IMPLANT
GAS AUTO FILL CONSTEL (OPHTHALMIC) ×2
GAS AUTO FILL CONSTELLATION (OPHTHALMIC) ×1 IMPLANT
GLOVE BIO SURGEON STRL SZ7.5 (GLOVE) ×2 IMPLANT
GLOVE BIOGEL M 7.0 STRL (GLOVE) ×2 IMPLANT
GLOVE SKINSENSE NS SZ7.0 (GLOVE) ×1
GLOVE SKINSENSE STRL SZ7.0 (GLOVE) ×1 IMPLANT
GOWN STRL REUS W/ TWL LRG LVL3 (GOWN DISPOSABLE) ×2 IMPLANT
GOWN STRL REUS W/ TWL XL LVL3 (GOWN DISPOSABLE) ×1 IMPLANT
GOWN STRL REUS W/TWL LRG LVL3 (GOWN DISPOSABLE) ×2
GOWN STRL REUS W/TWL XL LVL3 (GOWN DISPOSABLE) ×1
KIT BASIN OR (CUSTOM PROCEDURE TRAY) ×2 IMPLANT
KIT PERFLUORON PROCEDURE 5ML (MISCELLANEOUS) IMPLANT
LENS BIOM SUPER VIEW SET DISP (OPHTHALMIC RELATED) IMPLANT
LENS MACULAR ASPHERIC CONSTEL (OPHTHALMIC) ×2 IMPLANT
LENS VITRECTOMY FLAT OCLR DISP (MISCELLANEOUS) IMPLANT
MICROPICK 25G (MISCELLANEOUS)
NEEDLE 18GX1X1/2 (RX/OR ONLY) (NEEDLE) ×2 IMPLANT
NEEDLE 25GX 5/8IN NON SAFETY (NEEDLE) ×6 IMPLANT
NEEDLE HYPO 30X.5 LL (NEEDLE) ×4 IMPLANT
NEEDLE PRECISIONGLIDE 27X1.5 (NEEDLE) IMPLANT
NS IRRIG 1000ML POUR BTL (IV SOLUTION) ×2 IMPLANT
PACK VITRECTOMY CUSTOM (CUSTOM PROCEDURE TRAY) ×2 IMPLANT
PAD ARMBOARD 7.5X6 YLW CONV (MISCELLANEOUS) ×4 IMPLANT
PAK PIK VITRECTOMY CVS 25GA (OPHTHALMIC) ×2 IMPLANT
PENCIL BIPOLAR 25GA STR DISP (OPHTHALMIC RELATED) ×2 IMPLANT
PIC ILLUMINATED 25G (OPHTHALMIC) ×2
PICK MICROPICK 25G (MISCELLANEOUS) IMPLANT
PIK ILLUMINATED 25G (OPHTHALMIC) ×1 IMPLANT
PROBE DIATHERMY DSP 27GA (MISCELLANEOUS) IMPLANT
PROBE ENDO DIATHERMY 25G (MISCELLANEOUS) ×2 IMPLANT
PROBE LASER ILLUM FLEX CVD 25G (OPHTHALMIC) ×2 IMPLANT
REPL STRA BRUSH NEEDLE (NEEDLE) IMPLANT
RESERVOIR BACK FLUSH (MISCELLANEOUS) IMPLANT
RETRACTOR IRIS FLEX 25G GRIESH (INSTRUMENTS) IMPLANT
ROLLS DENTAL (MISCELLANEOUS) ×4 IMPLANT
SET INJECTOR OIL FLUID CONSTEL (OPHTHALMIC) IMPLANT
SPONGE SURGIFOAM ABS GEL 12-7 (HEMOSTASIS) IMPLANT
STOPCOCK 4 WAY LG BORE MALE ST (IV SETS) IMPLANT
SUT VICRYL 7 0 TG140 8 (SUTURE) ×2 IMPLANT
SYR 10ML LL (SYRINGE) ×2 IMPLANT
SYR 20CC LL (SYRINGE) ×2 IMPLANT
SYR 5ML LL (SYRINGE) ×2 IMPLANT
SYR BULB 3OZ (MISCELLANEOUS) ×2 IMPLANT
SYR TB 1ML LUER SLIP (SYRINGE) ×4 IMPLANT
TOWEL NATURAL 6PK STERILE (DISPOSABLE) ×2 IMPLANT
TUBING HIGH PRESS EXTEN 6IN (TUBING) ×2 IMPLANT
WATER STERILE IRR 1000ML POUR (IV SOLUTION) ×2 IMPLANT

## 2018-01-13 NOTE — Interval H&P Note (Signed)
History and Physical Interval Note:  01/13/2018 9:26 AM  Amber Young  has presented today for surgery, with the diagnosis of vitreous hemorrhage with tractional retinal detachment, left eye  The various methods of treatment have been discussed with the patient and family. After consideration of risks, benefits and other options for treatment, the patient has consented to  Procedure(s): PARS PLANA VITRECTOMY WITH 25 GAUGE (Left) MEMBRANE PEEL (Left) as a surgical intervention .  The patient's history has been reviewed, patient examined, no change in status, stable for surgery.  I have reviewed the patient's chart and labs.  Questions were answered to the patient's satisfaction.     Bernarda Caffey

## 2018-01-13 NOTE — Anesthesia Procedure Notes (Addendum)
Procedure Name: Intubation Date/Time: 01/13/2018 12:04 PM Performed by: Scheryl Darter, CRNA Pre-anesthesia Checklist: Patient identified, Emergency Drugs available, Suction available and Patient being monitored Patient Re-evaluated:Patient Re-evaluated prior to induction Oxygen Delivery Method: Circle System Utilized Preoxygenation: Pre-oxygenation with 100% oxygen Induction Type: IV induction Ventilation: Mask ventilation without difficulty Laryngoscope Size: Miller and 2 Tube type: Oral Tube size: 7.0 mm Number of attempts: 1 Airway Equipment and Method: Stylet and Oral airway Placement Confirmation: ETT inserted through vocal cords under direct vision,  positive ETCO2 and breath sounds checked- equal and bilateral Tube secured with: Tape Dental Injury: Teeth and Oropharynx as per pre-operative assessment

## 2018-01-13 NOTE — Op Note (Signed)
Date of procedure: 6.6.19  Surgeon: Bernarda Caffey, M.D., Ph.D  Assistant: Alyse Low, COA  Pre-operative Diagnosis: 1. Non-clearing vitreous hemorrhage, left eye 2. Tractional retinal detachment, left eye 3. Proliferative diabetic retinopathy, left eye  Post-operative diagnosis: Same  Anesthesia: General  Procedure: 1)   25 gauge pars plana vitrectomy w/ membrane peel, endolaser, and gas Left Eye CPT 81829  Complications: none Estimated blood loss: minimal Specimens: none  Brief history: The patient has a history of decreased vision in the affected eye, and on examination, was noted to have non-clearing vitreous hemorrhage and focal tractional retinal detachment affecting activities of daily living. The hemorrhage was monitored closely and failed to clear.The risks, benefits, and alternatives were explained to the patient, including pain, bleeding, infection, loss of vision, double vision, droopy eyelids, and need for more surgeries. Informed consent was obtained from the patient and placed in the chart.   Procedure: The patient was brought to the preoperative holding area where the correct eye was confirmed and marked. The patient was then brought to the operating room where general anesthesia was induced by the Anesthesia team. A secondary time-out was performed to identify the correct patient, eye, procedure, and any allergies. The eye was prepped and draped in the usual sterile ophthalmic fashion followed by placement of a lid speculum.  A 25 gauge trocar was placed in the inferotemporal quadrant 4 mm posterior to the limbus in a beveled fashion. A 4 mm infusion cannula was placed through this trocar, and the infusion cannula was confirmed in the vitreous cavity with no incarceration of retina or choroid prior to turning it on. Two additional 25 gauge trocars were placed in the superonasal and superotemporal quadrants in a similar beveled fashion. The light  pipe and cutter were introduced into the eye. Dense vitreous hemorrhage obscured the view of the retina.  At this time, a standard three-port pars plana vitrectomy was performed using the light pipe, the cutter, and the BIOM viewing system. A thorough core and peripheral vitreous dissection was performed. Once the retina was visualized, a focal tractional retinal detachment was noted superior to the superotemporal arcades. The remainder of the retina was attached without any other breaks or defects, but there were focal areas of vitreous traction without detachment nasal to the disc.  Kenalog was used to highlight the vitreous. Care was taken to insure the posterior vitreous was detached. Areas of preretinal fibrosis and vitreous traction were meticulously peeled/relieved using MaxGrip forceps and the vitrectomy probe. Then, peripheral vitrectomy was completed with care as to not disturb the crystalline lens. Next, endolaser was used to administer 360 PRP and reinforce a retinal break superior to ST arcade. There were no other peripheral breaks. Next, a fluid-air exchanged was performed and SRF was removed from the TRD via the superior retinal break. Additional laser was placed under air.   The superonasal trocar was thenremoved and sutured with 7-0 vicryl in an interrupted fashion.A complete air to14%C3F8gas exchange was performed through the infusion cannula and vented through the superotemporal trocar using the extrusion cannula.Thesuperotemporal trocar andinfusion cannula and associated trocar werethen removed and sutured with 7-0 vicryl in an interrupted fashion. The eye's intraocular pressurewas confirmed to be at a physiologic level by digital palpation. Subconjunctival injections of Antibiotic and kenalogwere administered. The lid speculum and drapes were removed. Drops of an antibiotic, antihypertensives, and steroid were given. Copious antibiotic ointment was instilled into the eye.  The eye was patched and shielded. The patient tolerated the procedure well without  any intraoperative or immediate postoperative complications. The patient was taken to the recovery room in good condition. The patient was instructed to maintain a strict face-down position andwill be seen by Dr. Baltazar Najjar in clinic.

## 2018-01-13 NOTE — Anesthesia Preprocedure Evaluation (Addendum)
Anesthesia Evaluation  Patient identified by MRN, date of birth, ID band Patient awake    Reviewed: Allergy & Precautions, H&P , NPO status , Patient's Chart, lab work & pertinent test results, reviewed documented beta blocker date and time   Airway Mallampati: II  TM Distance: >3 FB Neck ROM: full    Dental no notable dental hx.    Pulmonary neg pulmonary ROS,    Pulmonary exam normal breath sounds clear to auscultation       Cardiovascular Exercise Tolerance: Good hypertension, Pt. on medications + angina + CAD, + Cardiac Stents and +CHF   Rhythm:regular Rate:Normal  ECHO 1/19  Study Conclusions  - Left ventricle: The cavity size was mildly dilated. There was   severe concentric hypertrophy. Systolic function was mildly to   moderately reduced. The estimated ejection fraction was in the   range of 40% to 45%. Mild diffuse hypokinesis with distinct   regional wall motion abnormalities. There is akinesis of the   midanteroseptal and inferoseptal myocardium. There is akinesis of   the apicalinferior myocardium. There was a reduced contribution   of atrial contraction to ventricular filling, due to increased   ventricular diastolic pressure or atrial contractile dysfunction.   Doppler parameters are consistent with a reversible restrictive   pattern, indicative of decreased left ventricular diastolic   compliance and/or increased left atrial pressure (grade 3   diastolic dysfunction). Doppler parameters are consistent with   high ventricular filling pressure. - Mitral valve: There was mild regurgitation. - Left atrium: The atrium was mildly to moderately dilated. - Right atrium: The atrium was mildly dilated. - Pulmonary arteries: PA peak pressure: 43 mm Hg (S).  Cath 1/19  Prox RCA lesion is 75% stenosed.  A drug-eluting stent was successfully placed using a STENT PROMUS PREM MR 2.75X16.  Post intervention, there is a  5% residual stenosis.  Ost RCA lesion is 85% stenosed.  A drug-eluting stent was successfully placed using a STENT PROMUS PREM MR 3.0X12. Overlaps Stent 1 proximally.  Post intervention, there is a 0% residual stenosis.  Mid RCA-1 lesion is 30% stenosed. Mid RCA-2 lesion is 25% stenosed.  Ost 1st Diag lesion is 30% stenosed. Ost 2nd Diag to 2nd Diag lesion is 80% stenosed. Stable.  Prox LAD lesion is 40% stenosed. Previously placed Mid LAD Promus drug eluting stent is widely patent.  There is mild left ventricular systolic dysfunction. The left ventricular ejection fraction is 45-50% by visual estimate.  LV end diastolic pressure is mildly elevated.     Neuro/Psych negative neurological ROS  negative psych ROS   GI/Hepatic negative GI ROS, Neg liver ROS,   Endo/Other  negative endocrine ROSdiabetes, Type 2, Insulin Dependent  Renal/GU CRFRenal disease  negative genitourinary   Musculoskeletal   Abdominal   Peds  Hematology negative hematology ROS (+) anemia ,   Anesthesia Other Findings   Reproductive/Obstetrics negative OB ROS                           Anesthesia Physical Anesthesia Plan  ASA: III  Anesthesia Plan: General   Post-op Pain Management:    Induction: Intravenous  PONV Risk Score and Plan: 3 and Ondansetron and Treatment may vary due to age or medical condition  Airway Management Planned: Oral ETT and LMA  Additional Equipment:   Intra-op Plan:   Post-operative Plan: Extubation in OR  Informed Consent: I have reviewed the patients History and Physical, chart, labs  and discussed the procedure including the risks, benefits and alternatives for the proposed anesthesia with the patient or authorized representative who has indicated his/her understanding and acceptance.   Dental Advisory Given  Plan Discussed with: CRNA, Anesthesiologist and Surgeon  Anesthesia Plan Comments: (  )        Anesthesia Quick  Evaluation

## 2018-01-13 NOTE — Transfer of Care (Signed)
Immediate Anesthesia Transfer of Care Note  Patient: Amber Young  Procedure(s) Performed: PARS PLANA VITRECTOMY WITH 25 GAUGE LEFT EYE WITH ENDOLASER (Left Eye) MEMBRANE PEEL LEFT EYE  (Left Eye) INSERTION OF GAS (Left Eye)  Patient Location: PACU  Anesthesia Type:General  Level of Consciousness: drowsy and patient cooperative  Airway & Oxygen Therapy: Patient Spontanous Breathing and Patient connected to nasal cannula oxygen  Post-op Assessment: Report given to RN, Post -op Vital signs reviewed and stable and Patient moving all extremities X 4  Post vital signs: Reviewed and stable  Last Vitals:  Vitals Value Taken Time  BP 143/69 01/13/2018  2:53 PM  Temp    Pulse 51 01/13/2018  2:54 PM  Resp 16 01/13/2018  2:54 PM  SpO2 100 % 01/13/2018  2:54 PM  Vitals shown include unvalidated device data.  Last Pain:  Vitals:   01/13/18 0906  TempSrc:   PainSc: 0-No pain      Patients Stated Pain Goal: 3 (99/83/38 2505)  Complications: No apparent anesthesia complications

## 2018-01-13 NOTE — Anesthesia Postprocedure Evaluation (Signed)
Anesthesia Post Note  Patient: Essie Lagunes  Procedure(s) Performed: PARS PLANA VITRECTOMY WITH 25 GAUGE LEFT EYE WITH ENDOLASER (Left Eye) MEMBRANE PEEL LEFT EYE  (Left Eye) INSERTION OF GAS (Left Eye)     Patient location during evaluation: PACU Anesthesia Type: General Level of consciousness: awake and alert Pain management: pain level controlled Vital Signs Assessment: post-procedure vital signs reviewed and stable Respiratory status: spontaneous breathing, nonlabored ventilation, respiratory function stable and patient connected to nasal cannula oxygen Cardiovascular status: blood pressure returned to baseline and stable Postop Assessment: no apparent nausea or vomiting Anesthetic complications: no    Last Vitals:  Vitals:   01/13/18 1522 01/13/18 1550  BP: (!) 156/83 (!) 157/67  Pulse: (!) 54 (!) 53  Resp: 17   Temp: 36.5 C   SpO2: 100% 100%    Last Pain:  Vitals:   01/13/18 1550  TempSrc:   PainSc: 0-No pain                 Kajuana Shareef

## 2018-01-13 NOTE — Discharge Instructions (Addendum)
POSTOPERATIVE INSTRUCTIONS  Your doctor has performed vitreoretinal surgery on you at [facility]. [place] eye patched and shielded until seen by Dr. Coralyn Pear tomorrow in clinic - Do not use drops until return - [place] - Sleep with belly down or on right side, avoid laying flat on back.    - No strenuous bending, stooping or lifting.  - You may not drive until further notice.  - If your doctor used a gas bubble in your eye during the procedure he will advise you on postoperative positioning. If you have a gas bubble you will be wearing a green bracelet that was applied in the operating room. The green bracelet should stay on as long as the gas bubble is in your eye. While the gas bubble is present you should not fly in an airplane. If you require general anesthesia while the gas bubble is present you must notify your anesthesiologist that an intraocular gas bubble is present so he can take the appropriate precautions.  - Tylenol or any other over-the-counter pain reliever can be used according to your doctor. If more pain medicine is required, your doctor will have a prescription for you.  - You may read, go up and down stairs, and watch television.     Bernarda Caffey, M.D., Ph.D.

## 2018-01-13 NOTE — Brief Op Note (Signed)
01/13/2018  2:38 PM  PATIENT:  Amber Young  62 y.o. female  PRE-OPERATIVE DIAGNOSIS:  vitreous hemorrhage with tractional retinal detachment, left eye  POST-OPERATIVE DIAGNOSIS:  vitreous hemorrhage with tractional retinal detachment, left eye  PROCEDURE:  Procedure(s): PARS PLANA VITRECTOMY WITH 25 GAUGE LEFT EYE WITH ENDOLASER (Left) MEMBRANE PEEL LEFT EYE  (Left) INSERTION OF GAS (Left)  SURGEON:  Surgeon(s) and Role:    Bernarda Caffey, MD - Primary  ASSISTANTS: Catha Brow, COA   ANESTHESIA:   general  EBL:  0 mL   BLOOD ADMINISTERED:none  DRAINS: none   LOCAL MEDICATIONS USED:  NONE  SPECIMEN:  No Specimen  DISPOSITION OF SPECIMEN:  N/A  COUNTS:  YES  TOURNIQUET:  * No tourniquets in log *  DICTATION: .Note written in EPIC  PLAN OF CARE: Discharge to home after PACU  PATIENT DISPOSITION:  PACU - hemodynamically stable.   Delay start of Pharmacological VTE agent (>24hrs) due to surgical blood loss or risk of bleeding: not applicable

## 2018-01-13 NOTE — Progress Notes (Addendum)
[facility] Note  01/14/2018     CHIEF COMPLAINT Patient presents for Post-op Follow-up s/p 25g PPV/MP/EL/Gas OS for TRD and VH, 01/13/18  HISTORY OF PRESENT ILLNESS: Amber Young is a 62 y.o. female who presents to the clinic today for:   HPI    Post-op Follow-up    In left eye.  Discomfort includes itching.  Negative for pain, foreign body sensation, tearing, discharge, floaters and none.  Vision is stable.  I, the attending physician,  performed the HPI with the patient and updated documentation appropriately.          Comments    POV #1 25g [place] Vit. W/membrane peel endolaser and gas OS 01/13/18) Patient states she had a little pain yesterday afternoon, she took Advil,which relieved the pain, her eye itches some,no other symptoms voiced. Pt slept comfortably on her left side @[facility] .        Last edited by Bernarda Caffey, MD on 01/14/2018  9:42 AM. (History)      Referring physician: Scot Jun, [address] [place], [place] [id number]  HISTORICAL INFORMATION:   Selected notes from the MEDICAL RECORD NUMBER Referred by Molli Barrows, FNP for DM exam;  LEE-  Ocular Hx-  PMH- DM (A1C - 9.4 (02.13.19)), CKD, HTN, CAD, CHF, hx of stroke    CURRENT MEDICATIONS: No current outpatient medications on file. (Ophthalmic Drugs)   No current facility-administered medications for this visit.  (Ophthalmic Drugs)   Current Outpatient Medications (Other)  Medication Sig  . amLODipine (NORVASC) 10 MG tablet Take 1 tablet (10 mg total) by mouth daily.  Marland Kitchen atorvastatin (LIPITOR) 40 MG tablet Take 1 tablet (40 mg total) by mouth daily at 6 PM.  . carvedilol (COREG) 25 MG tablet Take 2 tablets (50 mg total) by mouth 2 (two) times daily.  . ferrous sulfate (FERROUSUL) 325 (65 FE) MG tablet Take 1 tablet (325 mg total) by mouth every other day.  . furosemide (LASIX) 40 MG tablet Take 1 tablet (40 mg total) by mouth daily.  . Glucose Blood (BLOOD  GLUCOSE TEST STRIPS) STRP Use as directed  . hydrALAZINE (APRESOLINE) 50 MG tablet Take 1.5 tablets (75 mg total) by mouth 3 (three) times daily. (Patient taking differently: Take 50 mg by mouth 3 (three) times daily. )  . insulin glargine (LANTUS) 100 UNIT/ML injection Inject 0.1 mLs (10 Units total) into the skin 2 (two) times daily.  . insulin lispro (HUMALOG) 100 UNIT/ML injection Inject 0.05 mLs (5 Units total) into the skin 3 (three) times daily with meals.  . Insulin Pen Needle 29G X 10MM MISC Use as directed  . INSULIN SYRINGE .5CC/29G 29G X 1/2" 0.5 ML MISC Use to administer insulin three times daily  . losartan (COZAAR) 50 MG tablet Take 1 tablet (50 mg total) by mouth 2 (two) times daily.  . nitroGLYCERIN (NITROSTAT) 0.4 MG SL tablet Place 1 tablet (0.4 mg total) under the tongue every 5 (five) minutes x 3 doses as needed for chest pain.  . potassium chloride SA (K-DUR,KLOR-CON) 10 MEQ tablet Take 1 tablet with each dose of furosemide, daily. (Patient taking differently: Take 10 mEq by mouth daily. )  . ticagrelor (BRILINTA) 90 MG TABS tablet Take 1 tablet (90 mg total) by mouth 2 (two) times daily.  Marland Kitchen warfarin (COUMADIN) 5 MG tablet Take 1 to 1.5 tablets by mouth daily as directed by coumadin clinic (Patient not taking: Reported on 01/14/2018)  No current facility-administered medications for this visit.  (Other)      REVIEW OF SYSTEMS: ROS    Positive for: Eyes   Negative for: Constitutional, Gastrointestinal, Neurological, Skin, Genitourinary, Musculoskeletal, HENT, Endocrine, Cardiovascular, Respiratory, Psychiatric, Allergic/Imm, Heme/Lymph   Last edited by Zenovia Jordan, LPN on 09/14/537  7:67 AM. (History)       ALLERGIES Allergies  Allergen Reactions  . Ace Inhibitors Cough    PAST MEDICAL HISTORY Past Medical History:  Diagnosis Date  . Anemia   . CHF (congestive heart failure) ([place])   . CKD (chronic kidney disease), stage III ([place])   . Coronary artery  disease   . GERD (gastroesophageal reflux disease)   . Heart murmur   . Hyperlipidemia   . Hypertension   . Proliferative diabetic retinopathy ([place])    left eye with vitreous hemorrhage and tractional retinal detachment  . Stroke ([place]) 09/2014   numbness left upper lip, finger tips on left hand; "resolved" (05/18/2016)  . Type II diabetes mellitus ([place])   . Wears glasses    Past Surgical History:  Procedure Laterality Date  . CARDIAC CATHETERIZATION N/A 05/18/2016   Procedure: Left Heart Cath and Coronary Angiography;  Surgeon: Jettie Booze, MD;  Location: [facility];  Service: Cardiovascular;  Laterality: N/A;  . CARDIAC CATHETERIZATION N/A 05/18/2016   Procedure: Coronary Stent Intervention;  Surgeon: Jettie Booze, MD;  Location: [facility];  Service: Cardiovascular;  Laterality: N/A;  . COLONOSCOPY W/ BIOPSIES AND POLYPECTOMY    . CORONARY ANGIOPLASTY    . CORONARY ANGIOPLASTY WITH STENT PLACEMENT    . CORONARY STENT INTERVENTION N/A 09/07/2017   Procedure: CORONARY STENT INTERVENTION;  Surgeon: Leonie Man, MD;  Location: [facility];  Service: Cardiovascular;  Laterality: N/A;  . EYE SURGERY    . GAS INSERTION Left 01/13/2018   Procedure: INSERTION OF GAS;  Surgeon: Bernarda Caffey, MD;  Location: [facility];  Service: Ophthalmology;  Laterality: Left;  . LEFT HEART CATH AND CORONARY ANGIOGRAPHY N/A 09/07/2017   Procedure: LEFT HEART CATH AND CORONARY ANGIOGRAPHY;  Surgeon: Leonie Man, MD;  Location: [facility];  Service: Cardiovascular;  Laterality: N/A;  . MEMBRANE PEEL Left 01/13/2018   Procedure: MEMBRANE PEEL LEFT EYE ;  Surgeon: Bernarda Caffey, MD;  Location: [facility];  Service: Ophthalmology;  Laterality: Left;  Marland Kitchen MULTIPLE TOOTH EXTRACTIONS    . PARS PLANA VITRECTOMY Left 01/13/2018   Procedure: PARS PLANA VITRECTOMY WITH 25 GAUGE LEFT EYE WITH ENDOLASER;  Surgeon: Bernarda Caffey, MD;  Location: [facility];  Service: Ophthalmology;  Laterality:  Left;  . TUBAL LIGATION  1980  . VAGINAL HYSTERECTOMY  1999   "partial; fibroids"    FAMILY HISTORY Family History  Problem Relation Age of Onset  . Diabetes Mother   . Hypertension Mother   . COPD Mother   . Heart failure Mother   . Hypertension Sister   . Hypertension Brother     SOCIAL HISTORY Social History   Tobacco Use  . Smoking status: Never Smoker  . Smokeless tobacco: Never Used  Substance Use Topics  . Alcohol use: No    Alcohol/week: 0.0 oz  . Drug use: No         OPHTHALMIC EXAM:  Base Eye Exam    Visual Acuity (Snellen - Linear)      Right Left   Dist [place] 20/100 +2 HM   Dist ph [place] NI NI  Tonometry (Tonopen, 8:46 AM)      Right Left   Pressure 24 15       Tonometry #2 (Tonopen, 9:00 AM)      Right Left   Pressure 18        Pupils      Dark Light Shape React APD   Right 3 2 Round Brisk None   Left 5 5 Round NR None       Visual Fields      Left Right     Full   Restrictions Total superior temporal, inferior temporal, superior nasal, inferior nasal deficiencies        Extraocular Movement      Right Left    Full, Ortho Full, Ortho       Neuro/Psych    Oriented x3:  Yes   Mood/Affect:  Normal       Dilation    Left eye:  1.0% Mydriacyl, 2.5% Phenylephrine @ 8:46 AM        Slit Lamp and Fundus Exam    Slit Lamp Exam      Right Left   Lids/Lashes Dermatochalasis - upper lid, mild Meibomian gland dysfunction Dermatochalasis - upper lid, Meibomian gland dysfunction   Conjunctiva/Sclera White and quiet Subconjunctival hemorrhage mostly nasal, sutures intact   Cornea Arcus, 1+ Punctate epithelial erosions, 1+ Guttata Arcus, 2+ Punctate epithelial erosions, EBMD, trace Guttata   Anterior Chamber Deep and quiet Moderate depth   Iris Round and dilated, No NVI Round and dilated, No NVI, persistent pupillary membrane   Lens 2-3+ Nuclear sclerosis, 2+ Cortical cataract, Vacuoles, iridescent refractile opacities 2-3+ Nuclear  sclerosis, 2+ Cortical cataract, Vacuoles, iridescent refractile opacities, 2-3+ PC feathering   Vitreous Vitreous syneresis, mild Asteroid hyalosis superiorly Post vitrectomy, VH cleared, good gas fill,        Fundus Exam      Right Left   Disc  hazy view, Pink and Sharp   C/D Ratio 0.5 0.5   Macula  hazy view -- grossly flat under gas   Vessels  Attenuated; sclerosis temporally   Periphery  Flat under gas, good 360 PRP in place; [facility] superiorly          IMAGING AND PROCEDURES  Imaging and Procedures for 12/07/17           ASSESSMENT/PLAN:    ICD-10-CM   1. Proliferative diabetic retinopathy of both eyes with macular edema associated with type 2 diabetes mellitus ([place]) W10.9323   2. Retinal edema H35.81   3. Vitreous hemorrhage of left eye (HCC) H43.12   4. 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 Essential hypertension I10   6. Hypertensive retinopathy of both eyes H35.033   7. Combined forms of age-related cataract of both eyes H25.813     1,2. Proliferative diabetic retinopathy with diabetic macular edema, OU - exam shows significant IRH, macular edema, fine NVE OU; preretinal and vitreous heme OS -- slowly improving OU - FA from prior shows NVE OU - OCT confirms diabetic macular edema, OU -- DME with minimal improvement - s/p IVA #1 OS (03.13.19), #2 (04.15.19), #3 (05.13.19), #4 (06.03.19) - s/p IVA #1 OD (03.15.19), #2 (04.15.19), #3 (05.13.19) - s/p PRP OS (04.05.19), PRP fill-in OS (04.30.19); OD will need PRP eventually - POD1 25g PPV/MP/EL/Gas OS under general anesthesia for VH and TRD repair OS (06.06.19)             - doing well this morning             -  IOP good             - start   PF 6x/day OS                         zymaxid QID OS                         Atropine BID OS                         Cosopt BID OS                         PSO ung QID OS             - cont face down positioning x3 days; avoid laying flat on back              - eye shield when sleeping             - post op drop and positioning instructions reviewed             - tylenol/ibuprofen for pain  3. Vitreous hemorrhage OS - secondary to #1, above - s/p IVA OS as above - s/p PRP OS (04.05.19 and 04.30.19) -- incomplete due to [facility] - of note pt on warfarin  - s/p PPV as above  4. 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 Hypertensive retinopathy OU - discussed importance of tight BP control - monitor  6. 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 - discussed diagnosis and progression - not yet visually significant - monitor for now   Ophthalmic Meds Ordered this visit:  No orders of the defined types were placed in this encounter.      Return in about 1 week (around 01/21/2018) for POV as scheduled.  There are no Patient Instructions on file for this visit.   Explained the diagnoses, plan, and follow up with the patient and they expressed understanding.  Patient expressed understanding of the importance of proper follow up care.   This document serves as a record of services personally performed by Gardiner Sleeper, MD, PhD. It was created on their behalf by Catha Brow, [place], a certified ophthalmic assistant. The creation of this record is the provider's dictation and/or activities during the visit.  Electronically signed by: Catha Brow, COA  06.06.19 9:42 AM   Gardiner Sleeper, M.D., Ph.D. Diseases & Surgery of the Retina and Vitreous [facility]  I have reviewed the above documentation for accuracy and completeness, and I agree with the above. Gardiner Sleeper, M.D., Ph.D. 01/14/18 9:42 AM    Abbreviations: M myopia (nearsighted); A astigmatism; H hyperopia (farsighted); P presbyopia; Mrx spectacle prescription;  CTL contact lenses; OD  right eye; OS left eye; OU both eyes  XT exotropia; ET esotropia; PEK punctate epithelial keratitis; PEE punctate epithelial erosions; DES dry eye syndrome; MGD meibomian gland dysfunction; ATs artificial tears; PFAT's preservative free artificial tears; [place] nuclear sclerotic cataract; PSC posterior subcapsular cataract; ERM epi-retinal membrane; PVD posterior vitreous detachment; RD retinal detachment; DM diabetes mellitus; DR diabetic retinopathy; NPDR non-proliferative diabetic retinopathy; PDR proliferative diabetic retinopathy; CSME clinically significant macular edema; DME diabetic macular edema; dbh dot blot  hemorrhages; CWS cotton wool spot; POAG primary open angle glaucoma; C/D cup-to-disc ratio; HVF humphrey visual field; GVF goldmann visual field; OCT optical coherence tomography; IOP intraocular pressure; BRVO Branch retinal vein occlusion; CRVO central retinal vein occlusion; CRAO central retinal artery occlusion; BRAO branch retinal artery occlusion; RT retinal tear; SB scleral buckle; PPV pars plana vitrectomy; VH Vitreous hemorrhage; PRP panretinal laser photocoagulation; IVK intravitreal kenalog; VMT vitreomacular traction; MH Macular hole;  NVD neovascularization of the disc; NVE neovascularization elsewhere; AREDS age related eye disease study; ARMD age related macular degeneration; POAG primary open angle glaucoma; EBMD epithelial/anterior basement membrane dystrophy; ACIOL anterior chamber intraocular lens; IOL intraocular lens; PCIOL posterior chamber intraocular lens; Phaco/IOL phacoemulsification with intraocular lens placement; [place] photorefractive keratectomy; LASIK laser assisted in situ keratomileusis; HTN hypertension; DM diabetes mellitus; COPD chronic obstructive pulmonary disease

## 2018-01-14 ENCOUNTER — Ambulatory Visit (HOSPITAL_COMMUNITY): Payer: BLUE CROSS/BLUE SHIELD

## 2018-01-14 ENCOUNTER — Ambulatory Visit (INDEPENDENT_AMBULATORY_CARE_PROVIDER_SITE_OTHER): Payer: BLUE CROSS/BLUE SHIELD | Admitting: Ophthalmology

## 2018-01-14 ENCOUNTER — Encounter (HOSPITAL_COMMUNITY): Payer: Self-pay | Admitting: Ophthalmology

## 2018-01-14 DIAGNOSIS — H35033 Hypertensive retinopathy, bilateral: Secondary | ICD-10-CM

## 2018-01-14 DIAGNOSIS — E113513 Type 2 diabetes mellitus with proliferative diabetic retinopathy with macular edema, bilateral: Secondary | ICD-10-CM

## 2018-01-14 DIAGNOSIS — H4312 Vitreous hemorrhage, left eye: Secondary | ICD-10-CM

## 2018-01-14 DIAGNOSIS — I10 Essential (primary) hypertension: Secondary | ICD-10-CM

## 2018-01-14 DIAGNOSIS — H3342 Traction detachment of retina, left eye: Secondary | ICD-10-CM

## 2018-01-14 DIAGNOSIS — H25813 Combined forms of age-related cataract, bilateral: Secondary | ICD-10-CM

## 2018-01-14 DIAGNOSIS — H3581 Retinal edema: Secondary | ICD-10-CM

## 2018-01-15 ENCOUNTER — Encounter (INDEPENDENT_AMBULATORY_CARE_PROVIDER_SITE_OTHER): Payer: BLUE CROSS/BLUE SHIELD | Admitting: Ophthalmology

## 2018-01-17 ENCOUNTER — Telehealth: Payer: Self-pay | Admitting: *Deleted

## 2018-01-17 ENCOUNTER — Ambulatory Visit (HOSPITAL_COMMUNITY): Payer: BLUE CROSS/BLUE SHIELD

## 2018-01-17 ENCOUNTER — Ambulatory Visit: Payer: BLUE CROSS/BLUE SHIELD | Admitting: Family Medicine

## 2018-01-17 NOTE — Telephone Encounter (Signed)
Faxed ROI to [facility]; release [phone]

## 2018-01-18 ENCOUNTER — Ambulatory Visit (INDEPENDENT_AMBULATORY_CARE_PROVIDER_SITE_OTHER): Payer: BLUE CROSS/BLUE SHIELD | Admitting: Pharmacist Clinician (PhC)/ Clinical Pharmacy Specialist

## 2018-01-18 ENCOUNTER — Ambulatory Visit (INDEPENDENT_AMBULATORY_CARE_PROVIDER_SITE_OTHER): Payer: BLUE CROSS/BLUE SHIELD | Admitting: Cardiovascular Disease

## 2018-01-18 ENCOUNTER — Encounter: Payer: Self-pay | Admitting: Cardiovascular Disease

## 2018-01-18 VITALS — BP 132/58 | HR 60 | Ht 66.0 in | Wt 187.0 lb

## 2018-01-18 DIAGNOSIS — Z7901 Long term (current) use of anticoagulants: Secondary | ICD-10-CM | POA: Diagnosis not present

## 2018-01-18 DIAGNOSIS — I5042 Chronic combined systolic (congestive) and diastolic (congestive) heart failure: Secondary | ICD-10-CM | POA: Diagnosis not present

## 2018-01-18 DIAGNOSIS — I480 Paroxysmal atrial fibrillation: Secondary | ICD-10-CM | POA: Diagnosis not present

## 2018-01-18 DIAGNOSIS — I25118 Atherosclerotic heart disease of native coronary artery with other forms of angina pectoris: Secondary | ICD-10-CM

## 2018-01-18 DIAGNOSIS — Z955 Presence of coronary angioplasty implant and graft: Secondary | ICD-10-CM

## 2018-01-18 DIAGNOSIS — I6523 Occlusion and stenosis of bilateral carotid arteries: Secondary | ICD-10-CM

## 2018-01-18 DIAGNOSIS — D638 Anemia in other chronic diseases classified elsewhere: Secondary | ICD-10-CM

## 2018-01-18 DIAGNOSIS — Z5181 Encounter for therapeutic drug level monitoring: Secondary | ICD-10-CM

## 2018-01-18 LAB — POCT INR: INR: 2 (ref 2.0–3.0)

## 2018-01-18 NOTE — Patient Instructions (Addendum)
Your physician recommends that you schedule a follow-up appointment in:  3 months with [place]      Your physician recommends that you continue on your current medications as directed. Please refer to the Current Medication list given to you today.    If you need a refill on your cardiac medications before your next appointment, please call your pharmacy.   Your physician has requested that you have a carotid duplex. This test is an ultrasound of the carotid arteries in your neck. It looks at blood flow through these arteries that supply the brain with blood. Allow one hour for this exam. There are no restrictions or special instructions.     Thank you for choosing [facility] !

## 2018-01-18 NOTE — Patient Instructions (Signed)
Description   Continue with 1 tablet daily except 1.5 tablets each Monday and Friday.  Repeat INR in 2 weeks

## 2018-01-18 NOTE — Progress Notes (Signed)
SUBJECTIVE: The patient presents for routine follow-up.  She recently underwent left eye surgery for nonclearing vitreous hemorrhage, tractional retinal detachment, and proliferative diabetic retinopathy.  When I last saw her on 11/22/2017, I treated her for decompensated heart failure.  Her weight is up 13 pounds since her last visit with me.  That being said, she is feeling very well and denies chest pain, shortness of breath, orthopnea, paroxysmal nocturnal dyspnea.  She thinks her weight gain is due to increased food consumption over the past several weeks.  She tries to avoid salt.  She does have some occasional palpitations after taking her medications and has some episodic dizziness but otherwise feels well overall.  She was evaluated by hematology and it was felt that she has anemia of chronic disease.  She underwent percutaneous coronary intervention with 2 drug-eluting stents placed to the proximal RCA on 09/07/17. There is a 40% proximal LAD stenosis and a previously placed mid LAD stent that was patent. LVEDP was mildly elevated.  Echocardiogram 09/08/17 showed mild to moderately function, LVEF 40-45%, severe concentric LVH, multiple wall motion abnormalities, grade 3 diastolic dysfunction with elevated filling pressures, mild mitral regurgitation, pulmonary pressures 43 mmHg.  She moved from [place] to [place] in early 2018 but prefers to continue following up with me in [place].    Review of Systems: As per "subjective", otherwise negative.  Allergies  Allergen Reactions  . Ace Inhibitors Cough    Current Outpatient Medications  Medication Sig Dispense Refill  . amLODipine (NORVASC) 10 MG tablet Take 1 tablet (10 mg total) by mouth daily. 90 tablet 2  . atorvastatin (LIPITOR) 40 MG tablet Take 1 tablet (40 mg total) by mouth daily at 6 PM. 90 tablet 1  . carvedilol (COREG) 25 MG tablet Take 2 tablets (50 mg total) by mouth 2 (two) times daily. 60 tablet 3  .  ferrous sulfate (FERROUSUL) 325 (65 FE) MG tablet Take 1 tablet (325 mg total) by mouth every other day. 90 tablet 3  . furosemide (LASIX) 40 MG tablet Take 1 tablet (40 mg total) by mouth daily. 90 tablet 3  . Glucose Blood (BLOOD GLUCOSE TEST STRIPS) STRP Use as directed 200 each 0  . hydrALAZINE (APRESOLINE) 50 MG tablet Take 1.5 tablets (75 mg total) by mouth 3 (three) times daily. (Patient taking differently: Take 50 mg by mouth 3 (three) times daily. ) 135 tablet 6  . insulin glargine (LANTUS) 100 UNIT/ML injection Inject 0.1 mLs (10 Units total) into the skin 2 (two) times daily. 20 mL 11  . insulin lispro (HUMALOG) 100 UNIT/ML injection Inject 0.05 mLs (5 Units total) into the skin 3 (three) times daily with meals. 30 mL 3  . Insulin Pen Needle 29G X 10MM MISC Use as directed 100 each 3  . INSULIN SYRINGE .5CC/29G 29G X 1/2" 0.5 ML MISC Use to administer insulin three times daily 200 each 5  . losartan (COZAAR) 50 MG tablet Take 1 tablet (50 mg total) by mouth 2 (two) times daily. 90 tablet 3  . nitroGLYCERIN (NITROSTAT) 0.4 MG SL tablet Place 1 tablet (0.4 mg total) under the tongue every 5 (five) minutes x 3 doses as needed for chest pain. 30 tablet 12  . potassium chloride SA (K-DUR,KLOR-CON) 10 MEQ tablet Take 1 tablet with each dose of furosemide, daily. (Patient taking differently: Take 10 mEq by mouth daily. ) 30 tablet 2  . ticagrelor (BRILINTA) 90 MG TABS tablet Take 1 tablet (90  mg total) by mouth 2 (two) times daily. 180 tablet 3  . warfarin (COUMADIN) 5 MG tablet Take 1 to 1.5 tablets by mouth daily as directed by coumadin clinic 120 tablet 1   No current facility-administered medications for this visit.     Past Medical History:  Diagnosis Date  . Anemia   . CHF (congestive heart failure) ([place])   . CKD (chronic kidney disease), stage III ([place])   . Coronary artery disease   . GERD (gastroesophageal reflux disease)   . Heart murmur   . Hyperlipidemia   . Hypertension   .  Proliferative diabetic retinopathy ([place])    left eye with vitreous hemorrhage and tractional retinal detachment  . Stroke ([place]) 09/2014   numbness left upper lip, finger tips on left hand; "resolved" (05/18/2016)  . Type II diabetes mellitus ([place])   . Wears glasses     Past Surgical History:  Procedure Laterality Date  . CARDIAC CATHETERIZATION N/A 05/18/2016   Procedure: Left Heart Cath and Coronary Angiography;  Surgeon: Jettie Booze, MD;  Location: [facility];  Service: Cardiovascular;  Laterality: N/A;  . CARDIAC CATHETERIZATION N/A 05/18/2016   Procedure: Coronary Stent Intervention;  Surgeon: Jettie Booze, MD;  Location: [facility];  Service: Cardiovascular;  Laterality: N/A;  . COLONOSCOPY W/ BIOPSIES AND POLYPECTOMY    . CORONARY ANGIOPLASTY    . CORONARY ANGIOPLASTY WITH STENT PLACEMENT    . CORONARY STENT INTERVENTION N/A 09/07/2017   Procedure: CORONARY STENT INTERVENTION;  Surgeon: Leonie Man, MD;  Location: [facility];  Service: Cardiovascular;  Laterality: N/A;  . EYE SURGERY    . GAS INSERTION Left 01/13/2018   Procedure: INSERTION OF GAS;  Surgeon: Bernarda Caffey, MD;  Location: [facility];  Service: Ophthalmology;  Laterality: Left;  . LEFT HEART CATH AND CORONARY ANGIOGRAPHY N/A 09/07/2017   Procedure: LEFT HEART CATH AND CORONARY ANGIOGRAPHY;  Surgeon: Leonie Man, MD;  Location: [facility];  Service: Cardiovascular;  Laterality: N/A;  . MEMBRANE PEEL Left 01/13/2018   Procedure: MEMBRANE PEEL LEFT EYE ;  Surgeon: Bernarda Caffey, MD;  Location: [facility];  Service: Ophthalmology;  Laterality: Left;  Marland Kitchen MULTIPLE TOOTH EXTRACTIONS    . PARS PLANA VITRECTOMY Left 01/13/2018   Procedure: PARS PLANA VITRECTOMY WITH 25 GAUGE LEFT EYE WITH ENDOLASER;  Surgeon: Bernarda Caffey, MD;  Location: [facility];  Service: Ophthalmology;  Laterality: Left;  . TUBAL LIGATION  1980  . VAGINAL HYSTERECTOMY  1999   "partial; fibroids"    Social History    Socioeconomic History  . Marital status: Married    Spouse name: Not on file  . Number of children: Not on file  . Years of education: 22  . Highest education level: Not on file  Occupational History  . Occupation: hairdresser  Social Needs  . Financial resource strain: Not on file  . Food insecurity:    Worry: Not on file    Inability: Not on file  . Transportation needs:    Medical: Not on file    Non-medical: Not on file  Tobacco Use  . Smoking status: Never Smoker  . Smokeless tobacco: Never Used  Substance and Sexual Activity  . Alcohol use: No    Alcohol/week: 0.0 oz  . Drug use: No  . Sexual activity: Yes  Lifestyle  . Physical activity:    Days per week: Not on file    Minutes per session: Not on file  .  Stress: Not on file  Relationships  . Social connections:    Talks on phone: Not on file    Gets together: Not on file    Attends religious service: Not on file    Active member of club or organization: Not on file    Attends meetings of clubs or organizations: Not on file    Relationship status: Not on file  . Intimate partner violence:    Fear of current or ex partner: Not on file    Emotionally abused: Not on file    Physically abused: Not on file    Forced sexual activity: Not on file  Other Topics Concern  . Not on file  Social History Narrative   Married, 2 children, hairdresser   Caffeine use- occasional tea or coffee   Right handed     Vitals:   01/18/18 1349  BP: (!) 132/58  Pulse: 60  SpO2: 96%  Weight: 187 lb (84.8 kg)  Height: 5\' 6"  (1.676 m)    Wt Readings from Last 3 Encounters:  01/18/18 187 lb (84.8 kg)  01/13/18 176 lb (79.8 kg)  12/29/17 181 lb 3.2 oz (82.2 kg)     PHYSICAL EXAM General: NAD HEENT: Normal. Neck: No JVD, no thyromegaly. Lungs: Clear to auscultation bilaterally with normal respiratory effort. CV: Regular rate and rhythm, normal S1/S2, no S3/S4, no murmur. No pretibial or periankle edema.  No carotid  bruit.   Abdomen: Soft, nontender, no distention.  Neurologic: Alert and oriented.  Psych: Normal affect. Skin: Normal. Musculoskeletal: No gross deformities.    ECG: Most recent ECG reviewed.   Labs: Lab Results  Component Value Date/Time   K 3.9 01/13/2018 09:14 AM   BUN 25 (H) 01/13/2018 09:14 AM   BUN 17 09/22/2017 09:58 AM   CREATININE 1.74 (H) 01/13/2018 09:14 AM   CREATININE 1.71 (H) 12/13/2017 10:49 AM   CREATININE 1.33 (H) 07/09/2017 10:47 AM   ALT 9 12/13/2017 10:49 AM   TSH 1.34 01/05/2017 11:46 AM   HGB 7.7 (L) 01/13/2018 09:14 AM   HGB 8.4 (L) 12/13/2017 10:49 AM   HGB 9.4 (L) 09/22/2017 09:58 AM     Lipids: Lab Results  Component Value Date/Time   LDLCALC 95 01/05/2017 11:46 AM   CHOL 190 01/05/2017 11:46 AM   TRIG 74 01/05/2017 11:46 AM   HDL 80 01/05/2017 11:46 AM       ASSESSMENT AND PLAN:  1.  Chronic combined systolic and diastolic heart failure, LVEF 40-45% with grade 3 diastolic dysfunction: In spite of weight gain, she is symptomatically stable.  She also attributes weight gain to increased food consumption. Continue carvedilol and losartan.  Blood pressure is controlled.  She appears to adhere to a low-sodium diet.  I will monitor her closely.  I asked her to inform me if symptoms should change.  2. Coronary artery disease with LAD stentand recent drug-eluting stent placement x2 to the ostial and proximal RCA: Symptomatically stable.  Currently on carvedilol,Brilinta, and Lipitor. No ASA as she is also on warfarin.  3.  Accelerated hypertension:  Blood pressure is normal.  No changes to therapy.  4. Carotidartery disease:Carotid Dopplersin 5/2017showed moderate left-sided plaque disease with no hemodynamically significant stenosis.Continue antiplatelet and statin therapy. I will repeat Dopplers.  5. Paroxysmal atrial fibrillation and atrial tachycardia: Currently on carvedilol.  While she does have palpitations from time to time  overall she is stable.  Anticoagulated with warfarin.No changes to therapy. INR monitoring is performed in our  [place] office.  6.    Anemia of chronic disease: Hemoglobin 7.7 and MCV 75.7 on 01/13/18.  She has been taking ferrous sulfate every other day.  She denies hematochezia and melena and fecal occult blood testing was negative.  She will likely require a repeat EGD/colonoscopy to evaluate for occult GI blood loss.  I previously made a referral to hematology who evaluated her and felt she had anemia of chronic disease.    Disposition: Follow up 3 months   Kate Sable, M.D., F.A.C.C.

## 2018-01-19 ENCOUNTER — Ambulatory Visit (HOSPITAL_COMMUNITY): Payer: BLUE CROSS/BLUE SHIELD

## 2018-01-20 ENCOUNTER — Encounter (INDEPENDENT_AMBULATORY_CARE_PROVIDER_SITE_OTHER): Payer: BLUE CROSS/BLUE SHIELD | Admitting: Ophthalmology

## 2018-01-20 NOTE — Progress Notes (Signed)
[facility] Note  01/21/2018     CHIEF COMPLAINT Patient presents for Post-op Follow-up s/p 25g PPV/MP/EL/Gas OS for TRD and VH, 01/13/18  HISTORY OF PRESENT ILLNESS: Amber Young is a 62 y.o. female who presents to the clinic today for:   HPI    Post-op Follow-up    In left eye.  Discomfort includes itching and none.  Negative for foreign body sensation, pain, discharge, floaters and tearing.  Vision is stable.  I, the attending physician,  performed the HPI with the patient and updated documentation appropriately.          Comments    POV2 S/P 25 g [place] vit. W/memb. [place]el endolaser/gas os (01/13/18) Patient states there has not been any visual changes" everything is about the same". OPt continues to have itching os. Pt is compliant with eye gtt's        Last edited by Cherrie Gauze, COA on 01/21/2018  9:28 AM. (History)      Referring physician: Scot Jun, [address], [place] [address]  HISTORICAL INFORMATION:   Selected notes from the MEDICAL RECORD NUMBER Referred by Molli Barrows, FNP for DM exam;  LEE-  Ocular Hx-  PMH- DM (A1C - 9.4 (02.13.19)), CKD, HTN, CAD, CHF, hx of stroke    CURRENT MEDICATIONS: No current outpatient medications on file. (Ophthalmic Drugs)   No current facility-administered medications for this visit.  (Ophthalmic Drugs)   Current Outpatient Medications (Other)  Medication Sig  . amLODipine (NORVASC) 10 MG tablet Take 1 tablet (10 mg total) by mouth daily.  Marland Kitchen atorvastatin (LIPITOR) 40 MG tablet Take 1 tablet (40 mg total) by mouth daily at 6 PM.  . carvedilol (COREG) 25 MG tablet Take 2 tablets (50 mg total) by mouth 2 (two) times daily.  . ferrous sulfate (FERROUSUL) 325 (65 FE) MG tablet Take 1 tablet (325 mg total) by mouth every other day.  . furosemide (LASIX) 40 MG tablet Take 1 tablet (40 mg total) by mouth daily.  . Glucose Blood (BLOOD GLUCOSE TEST STRIPS) STRP Use as  directed  . hydrALAZINE (APRESOLINE) 50 MG tablet Take 1.5 tablets (75 mg total) by mouth 3 (three) times daily. (Patient taking differently: Take 50 mg by mouth 3 (three) times daily. )  . insulin glargine (LANTUS) 100 UNIT/ML injection Inject 0.1 mLs (10 Units total) into the skin 2 (two) times daily.  . insulin lispro (HUMALOG) 100 UNIT/ML injection Inject 0.05 mLs (5 Units total) into the skin 3 (three) times daily with meals.  . Insulin Pen Needle 29G X 10MM MISC Use as directed  . INSULIN SYRINGE .5CC/29G 29G X 1/2" 0.5 ML MISC Use to administer insulin three times daily  . losartan (COZAAR) 50 MG tablet Take 1 tablet (50 mg total) by mouth 2 (two) times daily.  . nitroGLYCERIN (NITROSTAT) 0.4 MG SL tablet Place 1 tablet (0.4 mg total) under the tongue every 5 (five) minutes x 3 doses as needed for chest pain.  . potassium chloride SA (K-DUR,KLOR-CON) 10 MEQ tablet Take 1 tablet with each dose of furosemide, daily. (Patient taking differently: Take 10 mEq by mouth daily. )  . ticagrelor (BRILINTA) 90 MG TABS tablet Take 1 tablet (90 mg total) by mouth 2 (two) times daily.  Marland Kitchen warfarin (COUMADIN) 5 MG tablet Take 1 to 1.5 tablets by mouth daily as directed by coumadin clinic   No current facility-administered medications for this visit.  (Other)  REVIEW OF SYSTEMS: ROS    Positive for: Eyes   Negative for: Constitutional, Gastrointestinal, Neurological, Skin, Genitourinary, Musculoskeletal, HENT, Endocrine, Cardiovascular, Respiratory, Psychiatric, Allergic/Imm, Heme/Lymph   Last edited by Zenovia Jordan, LPN on 5/46/2703  5:00 AM. (History)       ALLERGIES Allergies  Allergen Reactions  . Ace Inhibitors Cough    PAST MEDICAL HISTORY Past Medical History:  Diagnosis Date  . Anemia   . CHF (congestive heart failure) ([place])   . CKD (chronic kidney disease), stage III ([place])   . Coronary artery disease   . GERD (gastroesophageal reflux disease)   . Heart murmur   .  Hyperlipidemia   . Hypertension   . Proliferative diabetic retinopathy ([place])    left eye with vitreous hemorrhage and tractional retinal detachment  . Stroke ([place]) 09/2014   numbness left upper lip, finger tips on left hand; "resolved" (05/18/2016)  . Type II diabetes mellitus ([place])   . Wears glasses    Past Surgical History:  Procedure Laterality Date  . CARDIAC CATHETERIZATION N/A 05/18/2016   Procedure: Left Heart Cath and Coronary Angiography;  Surgeon: Jettie Booze, MD;  Location: [facility] [facility];  Service: Cardiovascular;  Laterality: N/A;  . CARDIAC CATHETERIZATION N/A 05/18/2016   Procedure: Coronary Stent Intervention;  Surgeon: Jettie Booze, MD;  Location: [facility];  Service: Cardiovascular;  Laterality: N/A;  . COLONOSCOPY W/ BIOPSIES AND POLYPECTOMY    . CORONARY ANGIOPLASTY    . CORONARY ANGIOPLASTY WITH STENT PLACEMENT    . CORONARY STENT INTERVENTION N/A 09/07/2017   Procedure: CORONARY STENT INTERVENTION;  Surgeon: Leonie Man, MD;  Location: [facility];  Service: Cardiovascular;  Laterality: N/A;  . EYE SURGERY    . GAS INSERTION Left 01/13/2018   Procedure: INSERTION OF GAS;  Surgeon: Bernarda Caffey, MD;  Location: [facility];  Service: Ophthalmology;  Laterality: Left;  . LEFT HEART CATH AND CORONARY ANGIOGRAPHY N/A 09/07/2017   Procedure: LEFT HEART CATH AND CORONARY ANGIOGRAPHY;  Surgeon: Leonie Man, MD;  Location: [facility];  Service: Cardiovascular;  Laterality: N/A;  . MEMBRANE PEEL Left 01/13/2018   Procedure: MEMBRANE PEEL LEFT EYE ;  Surgeon: Bernarda Caffey, MD;  Location: [facility];  Service: Ophthalmology;  Laterality: Left;  Marland Kitchen MULTIPLE TOOTH EXTRACTIONS    . PARS PLANA VITRECTOMY Left 01/13/2018   Procedure: PARS PLANA VITRECTOMY WITH 25 GAUGE LEFT EYE WITH ENDOLASER;  Surgeon: Bernarda Caffey, MD;  Location: [facility];  Service: Ophthalmology;  Laterality: Left;  . TUBAL LIGATION  1980  . VAGINAL HYSTERECTOMY  1999   "partial;  fibroids"    FAMILY HISTORY Family History  Problem Relation Age of Onset  . Diabetes Mother   . Hypertension Mother   . COPD Mother   . Heart failure Mother   . Hypertension Sister   . Hypertension Brother     SOCIAL HISTORY Social History   Tobacco Use  . Smoking status: Never Smoker  . Smokeless tobacco: Never Used  Substance Use Topics  . Alcohol use: No    Alcohol/week: 0.0 oz  . Drug use: No         OPHTHALMIC EXAM:  Base Eye Exam    Visual Acuity (Snellen - Linear)      Right Left   Dist cc 20/100 -2 HM   Dist ph cc 20/70 -2 NI   Correction:  Glasses       Tonometry (Tonopen, 8:46 AM)  Right Left   Pressure 18 11       Pupils      Dark Light Shape React APD   Right 4 2 Round Slow None   Left 6 6 Round NR None       Visual Fields (Counting fingers)      Left Right     Full   Restrictions Total superior temporal, inferior temporal, superior nasal, inferior nasal deficiencies        Neuro/Psych    Oriented x3:  Yes   Mood/Affect:  Normal       Dilation    Left eye:  1.0% Mydriacyl, 2.5% Phenylephrine @ 8:46 AM        Slit Lamp and Fundus Exam    Slit Lamp Exam      Right Left   Lids/Lashes Dermatochalasis - upper lid, mild Meibomian gland dysfunction Dermatochalasis - upper lid, Meibomian gland dysfunction   Conjunctiva/Sclera White and quiet Subconjunctival hemorrhage mostly nasal -- clearing, sutures intact   Cornea Arcus, 1+ Punctate epithelial erosions, 1+ Guttata Arcus, 2+ Punctate epithelial erosions, EBMD, trace Guttata   Anterior Chamber Deep and quiet Deep and quiet   Iris Round and dilated, No NVI Round and dilated, No NVI, persistent pupillary membrane   Lens 2-3+ Nuclear sclerosis, 2+ Cortical cataract, Vacuoles, iridescent refractile opacities 2-3+ Nuclear sclerosis, 2+ Cortical cataract, Vacuoles, iridescent refractile opacities, 2-3+ PC feathering, 1+ Posterior subcapsular cataract   Vitreous Vitreous syneresis, mild  Asteroid hyalosis superiorly Post vitrectomy, VH cleared, good gas fill, 80% single gas bubble       Fundus Exam      Right Left   Disc  hazy view, Pink and Sharp   C/D Ratio 0.5 0.5   Macula  hazy view -- flat under gas   Vessels  Attenuated; sclerosis temporally   Periphery  Flat under gas, early 360 PRP in place; IRH superiorly, scattered dot hemorrhages          IMAGING AND PROCEDURES  Imaging and Procedures for 12/07/17           ASSESSMENT/PLAN:    ICD-10-CM   1. Proliferative diabetic retinopathy of both eyes with macular edema associated with type 2 diabetes mellitus ([place]) Z61.0960   2. Retinal edema H35.81 CANCELED: OCT, Retina - OU - Both Eyes  3. Vitreous hemorrhage of left eye (HCC) H43.12   4. 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 Essential hypertension I10   6. Hypertensive retinopathy of both eyes H35.033   7. Combined forms of age-related cataract of both eyes H25.813     1,2. Proliferative diabetic retinopathy with diabetic macular edema, OU - exam shows significant IRH, macular edema, fine NVE OU; preretinal and vitreous heme OS -- slowly improving OU - FA from prior shows NVE OU - OCT confirms diabetic macular edema, OU -- DME with minimal improvement - s/p IVA #1 OS (03.13.19), #2 (04.15.19), #3 (05.13.19), #4 (06.03.19) - s/p IVA #1 OD (03.15.19), #2 (04.15.19), #3 (05.13.19) - s/p PRP OS (04.05.19), PRP fill-in OS (04.30.19); OD will need PRP eventually - POW1 s/p 25g PPV/MP/EL/Gas OS under general anesthesia for VH and TRD repair OS (06.06.19)             - did well this week             - IOP good             - cont   PF 6x/day OS  zymaxid QID OS-- D/C on Saturday (06.15.19)                         Atropine BID OS-- stop                         Cosopt QD OS  -- dec to 1x/day                         PSO ung QID OS             - avoid laying flat on back             - can d/c eye shield when sleeping              - post op drop and positioning instructions reviewed             - tylenol/ibuprofen for pain  - F/U 3 weeks  3. Vitreous hemorrhage OS - secondary to #1, above - s/p IVA OS as above - s/p PRP OS (04.05.19 and 04.30.19) -- incomplete due to [facility] - of note pt on warfarin  - s/p PPV as above  4. 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 Hypertensive retinopathy OU - discussed importance of tight BP control - monitor   6. 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 - discussed diagnosis and progression - not yet visually significant - monitor for now   Ophthalmic Meds Ordered this visit:  No orders of the defined types were placed in this encounter.      Return in about 3 weeks (around 02/11/2018) for POV.  There are no Patient Instructions on file for this visit.   Explained the diagnoses, plan, and follow up with the patient and they expressed understanding.  Patient expressed understanding of the importance of proper follow up care.     Gardiner Sleeper, M.D., Ph.D. Diseases & Surgery of the Retina and Vitreous [facility]   I have reviewed the above documentation for accuracy and completeness, and I agree with the above. Gardiner Sleeper, M.D., Ph.D. 01/21/18 9:30 AM     Abbreviations: M myopia (nearsighted); A astigmatism; H hyperopia (farsighted); P presbyopia; Mrx spectacle prescription;  CTL contact lenses; OD right eye; OS left eye; OU both eyes  XT exotropia; ET esotropia; PEK punctate epithelial keratitis; PEE punctate epithelial erosions; DES dry eye syndrome; MGD meibomian gland dysfunction; ATs artificial tears; PFAT's preservative free artificial tears; [place] nuclear sclerotic cataract; PSC posterior subcapsular cataract; ERM epi-retinal membrane; PVD posterior  vitreous detachment; RD retinal detachment; DM diabetes mellitus; DR diabetic retinopathy; NPDR non-proliferative diabetic retinopathy; PDR proliferative diabetic retinopathy; CSME clinically significant macular edema; DME diabetic macular edema; dbh dot blot hemorrhages; CWS cotton wool spot; POAG primary open angle glaucoma; C/D cup-to-disc ratio; HVF humphrey visual field; GVF goldmann visual field; OCT optical coherence tomography; IOP intraocular pressure; BRVO Branch retinal vein occlusion; CRVO central retinal vein occlusion; CRAO central retinal artery occlusion; BRAO branch retinal artery occlusion; RT retinal tear; SB scleral buckle; PPV pars plana vitrectomy; VH Vitreous hemorrhage; PRP panretinal laser photocoagulation; IVK intravitreal kenalog; VMT vitreomacular traction; MH Macular hole;  NVD neovascularization of the disc; NVE neovascularization elsewhere; AREDS age related eye  disease study; ARMD age related macular degeneration; POAG primary open angle glaucoma; EBMD epithelial/anterior basement membrane dystrophy; ACIOL anterior chamber intraocular lens; IOL intraocular lens; PCIOL posterior chamber intraocular lens; Phaco/IOL phacoemulsification with intraocular lens placement; [place] photorefractive keratectomy; LASIK laser assisted in situ keratomileusis; HTN hypertension; DM diabetes mellitus; COPD chronic obstructive pulmonary disease

## 2018-01-21 ENCOUNTER — Encounter (INDEPENDENT_AMBULATORY_CARE_PROVIDER_SITE_OTHER): Payer: BLUE CROSS/BLUE SHIELD | Admitting: Ophthalmology

## 2018-01-21 ENCOUNTER — Ambulatory Visit (INDEPENDENT_AMBULATORY_CARE_PROVIDER_SITE_OTHER): Payer: BLUE CROSS/BLUE SHIELD | Admitting: Ophthalmology

## 2018-01-21 ENCOUNTER — Ambulatory Visit (HOSPITAL_COMMUNITY): Payer: BLUE CROSS/BLUE SHIELD

## 2018-01-21 ENCOUNTER — Encounter (INDEPENDENT_AMBULATORY_CARE_PROVIDER_SITE_OTHER): Payer: Self-pay | Admitting: Ophthalmology

## 2018-01-21 DIAGNOSIS — H35033 Hypertensive retinopathy, bilateral: Secondary | ICD-10-CM

## 2018-01-21 DIAGNOSIS — H4312 Vitreous hemorrhage, left eye: Secondary | ICD-10-CM

## 2018-01-21 DIAGNOSIS — I10 Essential (primary) hypertension: Secondary | ICD-10-CM

## 2018-01-21 DIAGNOSIS — E113513 Type 2 diabetes mellitus with proliferative diabetic retinopathy with macular edema, bilateral: Secondary | ICD-10-CM

## 2018-01-21 DIAGNOSIS — H3581 Retinal edema: Secondary | ICD-10-CM

## 2018-01-21 DIAGNOSIS — H25813 Combined forms of age-related cataract, bilateral: Secondary | ICD-10-CM

## 2018-01-21 DIAGNOSIS — H3342 Traction detachment of retina, left eye: Secondary | ICD-10-CM

## 2018-01-24 ENCOUNTER — Encounter: Payer: Self-pay | Admitting: Family Medicine

## 2018-01-24 ENCOUNTER — Ambulatory Visit (INDEPENDENT_AMBULATORY_CARE_PROVIDER_SITE_OTHER): Payer: BLUE CROSS/BLUE SHIELD | Admitting: Family Medicine

## 2018-01-24 ENCOUNTER — Ambulatory Visit (HOSPITAL_COMMUNITY): Payer: BLUE CROSS/BLUE SHIELD

## 2018-01-24 VITALS — BP 142/52 | HR 60 | Temp 98.0°F | Ht 66.0 in | Wt 183.0 lb

## 2018-01-24 DIAGNOSIS — R7989 Other specified abnormal findings of blood chemistry: Secondary | ICD-10-CM | POA: Diagnosis not present

## 2018-01-24 DIAGNOSIS — R8290 Unspecified abnormal findings in urine: Secondary | ICD-10-CM | POA: Diagnosis not present

## 2018-01-24 DIAGNOSIS — E1165 Type 2 diabetes mellitus with hyperglycemia: Secondary | ICD-10-CM

## 2018-01-24 DIAGNOSIS — Z09 Encounter for follow-up examination after completed treatment for conditions other than malignant neoplasm: Secondary | ICD-10-CM

## 2018-01-24 DIAGNOSIS — N390 Urinary tract infection, site not specified: Secondary | ICD-10-CM

## 2018-01-24 DIAGNOSIS — I10 Essential (primary) hypertension: Secondary | ICD-10-CM

## 2018-01-24 DIAGNOSIS — Z1329 Encounter for screening for other suspected endocrine disorder: Secondary | ICD-10-CM

## 2018-01-24 DIAGNOSIS — R319 Hematuria, unspecified: Secondary | ICD-10-CM

## 2018-01-24 DIAGNOSIS — R42 Dizziness and giddiness: Secondary | ICD-10-CM | POA: Diagnosis not present

## 2018-01-24 DIAGNOSIS — D649 Anemia, unspecified: Secondary | ICD-10-CM

## 2018-01-24 DIAGNOSIS — H6123 Impacted cerumen, bilateral: Secondary | ICD-10-CM | POA: Diagnosis not present

## 2018-01-24 LAB — POCT URINALYSIS DIP (MANUAL ENTRY)
Bilirubin, UA: NEGATIVE
Glucose, UA: 100 mg/dL — AB
Ketones, POC UA: NEGATIVE mg/dL
Nitrite, UA: NEGATIVE
Protein Ur, POC: >=300 mg/dL — AB
Spec Grav, UA: 1.025 (ref 1.010–1.025)
Urobilinogen, UA: 0.2 E.U./dL
pH, UA: 6 (ref 5.0–8.0)

## 2018-01-24 LAB — POCT GLYCOSYLATED HEMOGLOBIN (HGB A1C): Hemoglobin A1C: 9 % — AB (ref 4.0–5.6)

## 2018-01-24 MED ORDER — LOSARTAN POTASSIUM 50 MG PO TABS: 50.0000 mg | ORAL_TABLET | Freq: Two times a day (BID) | ORAL | 1 refills | Status: DC

## 2018-01-24 NOTE — Progress Notes (Signed)
Subjective:    Patient ID: Amber Young, female    DOB: 03-15-1956, 62 y.o.   MRN: [id number]   PCP: Kathe Becton, NP  HPI Amber Young has a history of Diabetes, Stroke, Hypertension, Hyperlipidemia, Heart Murmur, GERD, CAD, CKD-Stage III, CHF, and Anemia.   Current Status: She is doing well. She has c/o ear wax build up. She has tried peroxide and 'sweet oil' for relief.  She has frequent night flashes. She denies fevers, chills, fatigue, recent infections, and weight loss.  He has had recent eye surgery. She has occasional dizziness. She has not had any headaches, and falls.   She has occasional shortness of breath, which she r/t A-fib. Denies chest pain, heart palpitations, and cough reported.  No reports of GI problems. She has no reports of blood in stools, dysuria and hematuria. She is scheduled for a Colonoscopy in a few months.   No depression or anxiety.   She has no pain today.   Past Medical History:  Diagnosis Date  . Anemia   . CHF (congestive heart failure) ([place])   . CKD (chronic kidney disease), stage III ([place])   . Coronary artery disease   . GERD (gastroesophageal reflux disease)   . Heart murmur   . Hyperlipidemia   . Hypertension   . Proliferative diabetic retinopathy ([place])    left eye with vitreous hemorrhage and tractional retinal detachment  . Stroke ([place]) 09/2014   numbness left upper lip, finger tips on left hand; "resolved" (05/18/2016)  . Type II diabetes mellitus ([place])   . Wears glasses     Family History  Problem Relation Age of Onset  . Diabetes Mother   . Hypertension Mother   . COPD Mother   . Heart failure Mother   . Hypertension Sister   . Hypertension Brother     Social History   Socioeconomic History  . Marital status: Married    Spouse name: Not on file  . Number of children: Not on file  . Years of education: 47  . Highest education level: Not on file  Occupational History  . Occupation: hairdresser  Social Needs  .  Financial resource strain: Not on file  . Food insecurity:    Worry: Not on file    Inability: Not on file  . Transportation needs:    Medical: Not on file    Non-medical: Not on file  Tobacco Use  . Smoking status: Never Smoker  . Smokeless tobacco: Never Used  Substance and Sexual Activity  . Alcohol use: No    Alcohol/week: 0.0 oz  . Drug use: No  . Sexual activity: Yes  Lifestyle  . Physical activity:    Days per week: Not on file    Minutes per session: Not on file  . Stress: Not on file  Relationships  . Social connections:    Talks on phone: Not on file    Gets together: Not on file    Attends religious service: Not on file    Active member of club or organization: Not on file    Attends meetings of clubs or organizations: Not on file    Relationship status: Not on file  . Intimate partner violence:    Fear of current or ex partner: Not on file    Emotionally abused: Not on file    Physically abused: Not on file    Forced sexual activity: Not on file  Other Topics Concern  . Not on file  Social History Narrative   Married, 2 children, hairdresser   Caffeine use- occasional tea or coffee   Right handed    Past Surgical History:  Procedure Laterality Date  . CARDIAC CATHETERIZATION N/A 05/18/2016   Procedure: Left Heart Cath and Coronary Angiography;  Surgeon: Jettie Booze, MD;  Location: [facility];  Service: Cardiovascular;  Laterality: N/A;  . CARDIAC CATHETERIZATION N/A 05/18/2016   Procedure: Coronary Stent Intervention;  Surgeon: Jettie Booze, MD;  Location: [facility];  Service: Cardiovascular;  Laterality: N/A;  . COLONOSCOPY W/ BIOPSIES AND POLYPECTOMY    . CORONARY ANGIOPLASTY    . CORONARY ANGIOPLASTY WITH STENT PLACEMENT    . CORONARY STENT INTERVENTION N/A 09/07/2017   Procedure: CORONARY STENT INTERVENTION;  Surgeon: Leonie Man, MD;  Location: [facility];  Service: Cardiovascular;  Laterality: N/A;  . EYE  SURGERY    . GAS INSERTION Left 01/13/2018   Procedure: INSERTION OF GAS;  Surgeon: Bernarda Caffey, MD;  Location: [facility];  Service: Ophthalmology;  Laterality: Left;  . LEFT HEART CATH AND CORONARY ANGIOGRAPHY N/A 09/07/2017   Procedure: LEFT HEART CATH AND CORONARY ANGIOGRAPHY;  Surgeon: Leonie Man, MD;  Location: [facility];  Service: Cardiovascular;  Laterality: N/A;  . MEMBRANE PEEL Left 01/13/2018   Procedure: MEMBRANE PEEL LEFT EYE ;  Surgeon: Bernarda Caffey, MD;  Location: [facility];  Service: Ophthalmology;  Laterality: Left;  Marland Kitchen MULTIPLE TOOTH EXTRACTIONS    . PARS PLANA VITRECTOMY Left 01/13/2018   Procedure: PARS PLANA VITRECTOMY WITH 25 GAUGE LEFT EYE WITH ENDOLASER;  Surgeon: Bernarda Caffey, MD;  Location: [facility];  Service: Ophthalmology;  Laterality: Left;  . TUBAL LIGATION  1980  . VAGINAL HYSTERECTOMY  1999   "partial; fibroids"    Immunization History  Administered Date(s) Administered  . Tdap 01/05/2017   Current Meds  Medication Sig  . amLODipine (NORVASC) 10 MG tablet Take 1 tablet (10 mg total) by mouth daily.  Marland Kitchen atorvastatin (LIPITOR) 40 MG tablet Take 1 tablet (40 mg total) by mouth daily at 6 PM.  . carvedilol (COREG) 25 MG tablet Take 2 tablets (50 mg total) by mouth 2 (two) times daily.  . ferrous sulfate (FERROUSUL) 325 (65 FE) MG tablet Take 1 tablet (325 mg total) by mouth every other day.  . furosemide (LASIX) 40 MG tablet Take 1 tablet (40 mg total) by mouth daily.  . Glucose Blood (BLOOD GLUCOSE TEST STRIPS) STRP Use as directed  . hydrALAZINE (APRESOLINE) 50 MG tablet Take 1.5 tablets (75 mg total) by mouth 3 (three) times daily. (Patient taking differently: Take 50 mg by mouth 3 (three) times daily. )  . insulin glargine (LANTUS) 100 UNIT/ML injection Inject 0.1 mLs (10 Units total) into the skin 2 (two) times daily.  . insulin lispro (HUMALOG) 100 UNIT/ML injection Inject 0.05 mLs (5 Units total) into the skin 3 (three) times daily with meals.  . Insulin  Pen Needle 29G X 10MM MISC Use as directed  . INSULIN SYRINGE .5CC/29G 29G X 1/2" 0.5 ML MISC Use to administer insulin three times daily  . nitroGLYCERIN (NITROSTAT) 0.4 MG SL tablet Place 1 tablet (0.4 mg total) under the tongue every 5 (five) minutes x 3 doses as needed for chest pain.  . ticagrelor (BRILINTA) 90 MG TABS tablet Take 1 tablet (90 mg total) by mouth 2 (two) times daily.  Marland Kitchen warfarin (COUMADIN) 5 MG tablet Take 1 to 1.5 tablets by mouth  daily as directed by coumadin clinic    Allergies  Allergen Reactions  . Ace Inhibitors Cough   BP (!) 142/52   Pulse 60   Temp 98 F (36.7 C) (Oral)   Ht 5\' 6"  (1.676 m)   Wt 183 lb (83 kg)   SpO2 100%   BMI 29.54 kg/m   Review of Systems  Constitutional: Negative.   HENT: Negative.        Cerumen impaction bilaterally both ears.   Eyes: Negative.   Respiratory: Negative.   Cardiovascular: Negative.   Gastrointestinal: Negative.   Endocrine: Negative.   Genitourinary: Negative.   Musculoskeletal: Negative.   Skin: Negative.   Allergic/Immunologic: Negative.   Neurological: Positive for dizziness.  Hematological: Negative.   Psychiatric/Behavioral: Negative.    Objective:   Physical Exam  Constitutional: She is oriented to person, place, and time. She appears well-developed and well-nourished.  HENT:  Head: Normocephalic and atraumatic.  Nose: Nose normal.  Mouth/Throat: Oropharynx is clear and moist.  Cerumen impaction bilaterally in both ears.   Eyes: Pupils are equal, round, and reactive to light. Conjunctivae and EOM are normal.  Neck: Normal range of motion. Neck supple.  Cardiovascular: Normal rate, regular rhythm, normal heart sounds and intact distal pulses.  Pulmonary/Chest: Effort normal and breath sounds normal.  Abdominal: Soft. Bowel sounds are normal.  Musculoskeletal: Normal range of motion.  Neurological: She is alert and oriented to person, place, and time.  Skin: Skin is warm and dry.   Psychiatric: She has a normal mood and affect. Her behavior is normal. Judgment and thought content normal.  Nursing note and vitals reviewed.  Assessment & Plan:   1. Uncontrolled type 2 diabetes mellitus with hyperglycemia (HCC) Hgb A1c is improving at 9.0 today, from 10.6.  Urinalysis results are abnormal.  - POCT glycosylated hemoglobin (Hb A1C) - POCT urinalysis dipstick  2. Dizziness Stable. Probable from one blood pressure medication. Patient advised to change positions slowly when ambulating.  Monitor.   3. Bilateral impacted cerumen Nurse will perform Ear Lavage today to remove wax build up.  4. Abnormal urinalysis Urinalysis revealed Trace Leukocytes, blood and protein.  - Urine Culture  5. Urinary tract infection  6. Anemia, unspecified type - CBC with Differential  7. Hypocalcemia - Comprehensive metabolic panel  8. Elevated serum creatinine - Comprehensive metabolic panel  9. Screening for hypothyroidism - TSH  10. Essential hypertension Blood pressure is stbl146/62 today. She will continue Amlodipine, Carvedilol, Furosemide, Hydralazine, and Losartan as prescribed.   11. Follow up Follow up in 1 month.   Meds ordered this encounter  Medications  . losartan (COZAAR) 50 MG tablet    Sig: Take 1 tablet (50 mg total) by mouth 2 (two) times daily.    Dispense:  180 tablet    Refill:  [place],  MSN, FNP-BC [facility] [address] [place], [place] [address] [phone]

## 2018-01-25 ENCOUNTER — Ambulatory Visit (HOSPITAL_COMMUNITY)
Admission: RE | Admit: 2018-01-25 | Discharge: 2018-01-25 | Disposition: A | Discharge status: Home / Self Care | Payer: BLUE CROSS/BLUE SHIELD | Source: Ambulatory Visit | Attending: Cardiovascular Disease | Admitting: Cardiovascular Disease

## 2018-01-25 DIAGNOSIS — I6523 Occlusion and stenosis of bilateral carotid arteries: Secondary | ICD-10-CM

## 2018-01-25 LAB — CBC WITH DIFFERENTIAL/PLATELET
Basophils Absolute: 0 10*3/uL (ref 0.0–0.2)
Basos: 0 %
EOS (ABSOLUTE): 0.1 10*3/uL (ref 0.0–0.4)
Eos: 1 %
Hematocrit: 27.2 % — ABNORMAL LOW (ref 34.0–46.6)
Hemoglobin: 8.4 g/dL — ABNORMAL LOW (ref 11.1–15.9)
Immature Grans (Abs): 0 10*3/uL (ref 0.0–0.1)
Immature Granulocytes: 0 %
Lymphocytes Absolute: 1.6 10*3/uL (ref 0.7–3.1)
Lymphs: 23 %
MCH: 24.1 pg — ABNORMAL LOW (ref 26.6–33.0)
MCHC: 30.9 g/dL — ABNORMAL LOW (ref 31.5–35.7)
MCV: 78 fL — ABNORMAL LOW (ref 79–97)
Monocytes Absolute: 0.7 10*3/uL (ref 0.1–0.9)
Monocytes: 10 %
Neutrophils Absolute: 4.5 10*3/uL (ref 1.4–7.0)
Neutrophils: 66 %
Platelets: 356 10*3/uL (ref 150–450)
RBC: 3.49 x10E6/uL — ABNORMAL LOW (ref 3.77–5.28)
RDW: 17.6 % — ABNORMAL HIGH (ref 12.3–15.4)
WBC: 6.9 10*3/uL (ref 3.4–10.8)

## 2018-01-25 LAB — COMPREHENSIVE METABOLIC PANEL
ALT: 30 IU/L (ref 0–32)
AST: 13 IU/L (ref 0–40)
Albumin/Globulin Ratio: 0.9 — ABNORMAL LOW (ref 1.2–2.2)
Albumin: 3.1 g/dL — ABNORMAL LOW (ref 3.6–4.8)
Alkaline Phosphatase: 169 IU/L — ABNORMAL HIGH (ref 39–117)
BUN/Creatinine Ratio: 14 (ref 12–28)
BUN: 21 mg/dL (ref 8–27)
Bilirubin Total: <0.2 mg/dL (ref 0.0–1.2)
CO2: 22 mmol/L (ref 20–29)
Calcium: 8.6 mg/dL — ABNORMAL LOW (ref 8.7–10.3)
Chloride: 108 mmol/L — ABNORMAL HIGH (ref 96–106)
Creatinine, Ser: 1.48 mg/dL — ABNORMAL HIGH (ref 0.57–1.00)
GFR calc Af Amer: 44 mL/min/{1.73_m2} — ABNORMAL LOW (ref >59)
GFR calc non Af Amer: 38 mL/min/{1.73_m2} — ABNORMAL LOW (ref >59)
Globulin, Total: 3.3 g/dL (ref 1.5–4.5)
Glucose: 253 mg/dL — ABNORMAL HIGH (ref 65–99)
Potassium: 4.7 mmol/L (ref 3.5–5.2)
Sodium: 141 mmol/L (ref 134–144)
Total Protein: 6.4 g/dL (ref 6.0–8.5)

## 2018-01-25 LAB — TSH: TSH: 1.73 u[IU]/mL (ref 0.450–4.500)

## 2018-01-25 MED ORDER — AMOXICILLIN-POT CLAVULANATE 875-125 MG PO TABS: 1.0000 | ORAL_TABLET | Freq: Two times a day (BID) | ORAL | 0 refills | Status: DC

## 2018-01-26 ENCOUNTER — Ambulatory Visit (HOSPITAL_COMMUNITY): Payer: BLUE CROSS/BLUE SHIELD

## 2018-01-26 LAB — URINE CULTURE

## 2018-01-26 MED FILL — AMOX-CLAV 875-125 MG TABLET: 875-125 | 10 days supply | Qty: 20 | Fill #0

## 2018-01-26 NOTE — Telephone Encounter (Signed)
-----   Message from Azzie Glatter, [place] sent at 01/25/2018  8:51 PM EDT ----- Regarding: "Lab Results" Please call patient to let her know that:  1) Hgb A1c has decreased to 9.0 from 10.6.  2) Urinalysis revealed UTI; Rx for Augmentin X 10 days to pharmacy. 3) All other labs are stable.

## 2018-01-26 NOTE — Telephone Encounter (Signed)
Left a detailed vm for patient.

## 2018-01-26 NOTE — Progress Notes (Signed)
Office Visit Note  Patient: Amber Young             Date of Birth: Feb 25, 1956           MRN: [id number]             PCP: Azzie Glatter, FNP Referring: Ardath Sax, MD Visit Date: 01/27/2018 Occupation: Retired Theatre manager    Subjective:  Positive rheumatoid factor  History of Present Illness: Amber Young is a 62 y.o. female seen in consultation per request of her hematologist.  According to patient she was hospitalized in May for left humerus fracture.  At the time she had some arrhythmias and she was referred to cardiologist.  The blood work showed anemia and she was referred to hematologist.  Where she had extensive work-up which showed elevated sedimentation rate and positive rheumatoid factor.  For evaluation of elevated sed rate and positive rheumatoid factor she was referred to me.  Patient denies any joint pain or joint swelling.  Activities of Daily Living:  Patient reports morning stiffness for 0 minute.   Patient Denies nocturnal pain.  Difficulty dressing/grooming: Reports Difficulty climbing stairs: Reports Difficulty getting out of chair: Reports Difficulty using hands for taps, buttons, cutlery, and/or writing: Denies   Review of Systems  Constitutional: Negative for fatigue, fever, night sweats, weight gain and weight loss.  HENT: Negative for ear pain, mouth sores, trouble swallowing, trouble swallowing, mouth dryness and nose dryness.   Eyes: Negative for pain, redness, visual disturbance and dryness.  Respiratory: Negative for cough, hemoptysis, shortness of breath and difficulty breathing.   Cardiovascular: Positive for palpitations and hypertension. Negative for chest pain, irregular heartbeat and swelling in legs/feet.  Gastrointestinal: Negative for blood in stool, constipation and diarrhea.  Endocrine: Negative for increased urination.  Genitourinary: Negative for difficulty urinating, painful urination and vaginal dryness.    Musculoskeletal: Negative for arthralgias, joint pain, joint swelling, myalgias, muscle weakness, morning stiffness, muscle tenderness and myalgias.  Skin: Positive for hair loss and sensitivity to sunlight. Negative for color change, pallor, rash, nodules/bumps, skin tightness and ulcers.  Allergic/Immunologic: Negative for susceptible to infections.  Neurological: Negative for dizziness, numbness, headaches, memory loss, night sweats and weakness.  Hematological: Negative for bruising/bleeding tendency and swollen glands.  Psychiatric/Behavioral: Negative for depressed mood and sleep disturbance. The patient is not nervous/anxious.     PMFS History:  Patient Active Problem List   Diagnosis Date Noted  . CKD (chronic kidney disease), stage III ([place]) 09/07/2017  . Hypertensive urgency 09/07/2017  . Chest pain at rest 09/07/2017  . Chest pain 09/07/2017  . Essential hypertension   . Unstable angina ([place])   . Coronary artery disease involving native coronary artery of native heart with unstable angina pectoris ([place])   . Chronic anticoagulation 01/06/2017  . Non compliance w medication regimen 01/06/2017  . Breast abscess 12/20/2016  . Encounter for therapeutic drug monitoring 05/29/2016  . CAD S/P PCI mLAD DES (Promus 3.0 x 24 -- 3.25 mm) 05/19/2016  . PAF (paroxysmal atrial fibrillation) ([place]) 05/19/2016  . Cardiomyopathy, ischemic     Class: Diagnosis of  . Microcytic anemia 04/26/2016  . Acute on chronic combined systolic (congestive) and diastolic (congestive) heart failure ([place]) 11/14/2015  . Hypokalemia 11/14/2015  . Hypertensive emergency   . Uncontrolled hypertension 09/30/2014  . Uncontrolled diabetes mellitus ([place]) 09/30/2014  . History of stroke 09/30/2014    Past Medical History:  Diagnosis Date  . Anemia   . CHF (congestive heart failure) ([place])   .  CKD (chronic kidney disease), stage III ([place])   . Coronary artery disease   . GERD (gastroesophageal reflux disease)    . Heart murmur   . Hyperlipidemia   . Hypertension   . Proliferative diabetic retinopathy ([place])    left eye with vitreous hemorrhage and tractional retinal detachment  . Stroke ([place]) 09/2014   numbness left upper lip, finger tips on left hand; "resolved" (05/18/2016)  . Type II diabetes mellitus ([place])   . Wears glasses     Family History  Problem Relation Age of Onset  . Diabetes Mother   . Hypertension Mother   . COPD Mother   . Heart failure Mother   . Hypertension Sister   . Hypertension Brother    Past Surgical History:  Procedure Laterality Date  . CARDIAC CATHETERIZATION N/A 05/18/2016   Procedure: Left Heart Cath and Coronary Angiography;  Surgeon: Jettie Booze, MD;  Location: [facility];  Service: Cardiovascular;  Laterality: N/A;  . CARDIAC CATHETERIZATION N/A 05/18/2016   Procedure: Coronary Stent Intervention;  Surgeon: Jettie Booze, MD;  Location: [facility];  Service: Cardiovascular;  Laterality: N/A;  . COLONOSCOPY W/ BIOPSIES AND POLYPECTOMY    . CORONARY ANGIOPLASTY    . CORONARY ANGIOPLASTY WITH STENT PLACEMENT    . CORONARY STENT INTERVENTION N/A 09/07/2017   Procedure: CORONARY STENT INTERVENTION;  Surgeon: Leonie Man, MD;  Location: [facility];  Service: Cardiovascular;  Laterality: N/A;  . EYE SURGERY    . GAS INSERTION Left 01/13/2018   Procedure: INSERTION OF GAS;  Surgeon: Bernarda Caffey, MD;  Location: [facility];  Service: Ophthalmology;  Laterality: Left;  . LEFT HEART CATH AND CORONARY ANGIOGRAPHY N/A 09/07/2017   Procedure: LEFT HEART CATH AND CORONARY ANGIOGRAPHY;  Surgeon: Leonie Man, MD;  Location: [facility];  Service: Cardiovascular;  Laterality: N/A;  . MEMBRANE PEEL Left 01/13/2018   Procedure: MEMBRANE PEEL LEFT EYE ;  Surgeon: Bernarda Caffey, MD;  Location: [facility];  Service: Ophthalmology;  Laterality: Left;  Marland Kitchen MULTIPLE TOOTH EXTRACTIONS    . PARS PLANA VITRECTOMY Left 01/13/2018   Procedure: PARS  PLANA VITRECTOMY WITH 25 GAUGE LEFT EYE WITH ENDOLASER;  Surgeon: Bernarda Caffey, MD;  Location: [facility];  Service: Ophthalmology;  Laterality: Left;  . TUBAL LIGATION  1980  . VAGINAL HYSTERECTOMY  1999   "partial; fibroids"   Social History   Social History Narrative   Married, 2 children, hairdresser   Caffeine use- occasional tea or coffee   Right handed     Objective: Vital Signs: BP (!) 194/86 (BP Location: Left Arm, Patient Position: Sitting, Cuff Size: Normal)   Pulse (!) 56   Ht 5\' 6"  (1.676 m)   Wt 184 lb (83.5 kg)   BMI 29.70 kg/m    Physical Exam  Constitutional: She is oriented to person, place, and time. She appears well-developed and well-nourished.  HENT:  Head: Normocephalic and atraumatic.  Eyes: Conjunctivae and EOM are normal.  Neck: Normal range of motion.  Cardiovascular: Normal rate, regular rhythm, normal heart sounds and intact distal pulses.  Pulmonary/Chest: Effort normal and breath sounds normal.  Abdominal: Soft. Bowel sounds are normal.  Lymphadenopathy:    She has no cervical adenopathy.  Neurological: She is alert and oriented to person, place, and time.  Skin: Skin is warm and dry. Capillary refill takes less than 2 seconds.  Psychiatric: She has a normal mood and affect. Her behavior is normal.  Nursing note and vitals reviewed.    Musculoskeletal Exam:   CDAI Exam: No CDAI exam completed.    Investigation: Findings:  12/28/2017: C-ANCA < 1: 20, P ANCA < 1: 20, atypical P-ANCA <1: 20, C3 153, C4 37, CRP 1, sed rate 92, ANA negative, RF 35.1   Component     Latest Ref Rng & Units 12/28/2017  Cytoplasmic (C-ANCA)     Neg:<1:20 titer <1:20  P-ANCA     Neg:<1:20 titer <1:20  Atypical P-ANCA titer     Neg:<1:20 titer <1:20  C3 Complement     82 - 167 mg/dL 153  Complement C4, Body Fluid     14 - 44 mg/dL 37  CRP     <1.0 mg/dL 1.0 (H)  Sed Rate     0 - 22 mm/hr 92 (H)  ANA Ab, IFA      Negative  RA Latex Turbid.     0.0 -  13.9 IU/mL 35.1 (H)   CBC Latest Ref Rng & Units 01/24/2018 01/13/2018 12/13/2017  WBC 3.4 - 10.8 x10E3/uL 6.9 5.0 4.9  Hemoglobin 11.1 - 15.9 g/dL 8.4(L) 7.7(L) 8.4(L)  Hematocrit 34.0 - 46.6 % 27.2(L) 24.9(L) 26.6(L)  Platelets 150 - 450 x10E3/uL 356 319 270   CMP Latest Ref Rng & Units 01/24/2018 01/13/2018 12/13/2017  Glucose 65 - 99 mg/dL 253(H) 83 307(H)  BUN 8 - 27 mg/dL 21 25(H) 33(H)  Creatinine 0.57 - 1.00 mg/dL 1.48(H) 1.74(H) 1.71(H)  Sodium 134 - 144 mmol/L 141 142 139  Potassium 3.5 - 5.2 mmol/L 4.7 3.9 4.2  Chloride 96 - 106 mmol/L 108(H) 113(H) 108  CO2 20 - 29 mmol/L 22 22 25   Calcium 8.7 - 10.3 mg/dL 8.6(L) 8.4(L) 8.9  Total Protein 6.0 - 8.5 g/dL 6.4 - 7.1  Total Bilirubin 0.0 - 1.2 mg/dL <0.2 - 0.2  Alkaline Phos 39 - 117 IU/L 169(H) - 113  AST 0 - 40 IU/L 13 - 15  ALT 0 - 32 IU/L 30 - 9     Imaging: Dg Skull 1-3 Views  Result Date: 01/13/2018 CLINICAL DATA:  Missing instrument. EXAM: SKULL - 1-3 VIEW COMPARISON:  None. FINDINGS: There is no evidence of skull fracture or other focal bone lesions. No definite evidence of radiopaque foreign body is noted. IMPRESSION: No definite evidence of radiopaque foreign body seen. Electronically Signed   By: Marijo Conception, M.D.   On: 01/13/2018 14:49   US Carotid Duplex Bilateral  Result Date: 01/25/2018 CLINICAL DATA:  Carotid atherosclerosis EXAM: BILATERAL CAROTID DUPLEX ULTRASOUND TECHNIQUE: Pearline Cables scale imaging, color Doppler and duplex ultrasound were performed of bilateral carotid and vertebral arteries in the neck. COMPARISON:  12/26/2015 FINDINGS: Criteria: Quantification of carotid stenosis is based on velocity parameters that correlate the residual internal carotid diameter with NASCET-based stenosis levels, using the diameter of the distal internal carotid lumen as the denominator for stenosis measurement. The following velocity measurements were obtained: RIGHT ICA: 101/26 cm/sec CCA: 86/76 cm/sec SYSTOLIC ICA/CCA RATIO:  1.2  ECA:  109 cm/sec LEFT ICA: 89/27 cm/sec CCA: 720/94 cm/sec SYSTOLIC ICA/CCA RATIO:  0.6 ECA:  91 cm/sec RIGHT CAROTID ARTERY: Minor intimal thickening and scattered atherosclerosis. No hemodynamically significant right ICA stenosis, velocity elevation, or turbulent flow. Degree of narrowing less than 50%. RIGHT VERTEBRAL ARTERY:  Antegrade LEFT CAROTID ARTERY: Similar scattered minor intimal thickening and atherosclerosis. No hemodynamically significant left ICA stenosis, velocity elevation, or turbulent flow. LEFT VERTEBRAL ARTERY:  Antegrade IMPRESSION: Minor carotid intimal thickening and  atherosclerosis. No hemodynamically significant ICA stenosis by ultrasound. Degree of narrowing less than 50% bilaterally. Patent antegrade vertebral flow bilaterally. Electronically Signed   By: Jerilynn Mages.  Shick M.D.   On: 01/25/2018 16:56   Mm 3d Screen Breast Bilateral  Result Date: 01/04/2018 CLINICAL DATA:  Screening. EXAM: DIGITAL SCREENING BILATERAL MAMMOGRAM WITH TOMO AND CAD COMPARISON:  Previous exam(s). ACR Breast Density Category b: There are scattered areas of fibroglandular density. FINDINGS: There are no findings suspicious for malignancy. Images were processed with CAD. IMPRESSION: No mammographic evidence of malignancy. A result letter of this screening mammogram will be mailed directly to the patient. RECOMMENDATION: Screening mammogram in one year. (Code:SM-B-01Y) BI-RADS CATEGORY  1: Negative. Electronically Signed   By: Lajean Manes M.D.   On: 01/04/2018 07:59    Speciality Comments: No specialty comments available.    Procedures:  No procedures performed Allergies: Ace inhibitors   Assessment / Plan:     Visit Diagnoses: Rheumatoid factor positive - 12/28/2017: C-ANCA < 1: 20, P ANCA < 1: 20, atypical P-ANCA <1: 20, C3 153, C4 37, CRP 1, sed rate 92, ANA negative, RF 35.1.  Patient has positive rheumatoid factor and elevated sedimentation rate.  Although she does not have any synovitis or joint  pain or joint swelling.  Rheumatoid factor is nonspecific and can be seen in normal population and also with other chronic conditions.  At this point she has no clinical features of rheumatoid arthritis.  We had detailed discussion regarding rheumatoid factor and also advised her to return to [place] in case she develops any joint pain.  Rheumatoid factor can be seen with hepatitis as well.  I offered to obtain lab test for hepatitis but she declined.  Microcytic anemia-she is followed by hematology.  Other medical problems are listed as follows:  Dysproteinemia  PAF (paroxysmal atrial fibrillation) ([place])  Essential hypertension  Coronary artery disease  - s/p stent placement  CKD (chronic kidney disease), stage III (HCC)  History of diabetes mellitus  History of gastroesophageal reflux (GERD)  History of hyperlipidemia    Orders: No orders of the defined types were placed in this encounter.  No orders of the defined types were placed in this encounter.   Face-to-face time spent with patient was 30 minutes. >50% of time was spent in counseling and coordination of care.  Follow-Up Instructions: Return if symptoms worsen or fail to improve, for +RF.   Bo Merino, MD  Note - This record has been created using Editor, commissioning.  Chart creation errors have been sought, but may not always  have been located. Such creation errors do not reflect on  the standard of medical care.

## 2018-01-27 ENCOUNTER — Telehealth: Payer: Self-pay | Admitting: *Deleted

## 2018-01-27 ENCOUNTER — Encounter: Payer: Self-pay | Admitting: Rheumatology

## 2018-01-27 ENCOUNTER — Ambulatory Visit (INDEPENDENT_AMBULATORY_CARE_PROVIDER_SITE_OTHER): Payer: BLUE CROSS/BLUE SHIELD | Admitting: Rheumatology

## 2018-01-27 VITALS — BP 194/86 | HR 56 | Ht 66.0 in | Wt 184.0 lb

## 2018-01-27 DIAGNOSIS — N183 Chronic kidney disease, stage 3 unspecified: Secondary | ICD-10-CM

## 2018-01-27 DIAGNOSIS — Z8639 Personal history of other endocrine, nutritional and metabolic disease: Secondary | ICD-10-CM

## 2018-01-27 DIAGNOSIS — I10 Essential (primary) hypertension: Secondary | ICD-10-CM | POA: Diagnosis not present

## 2018-01-27 DIAGNOSIS — I25110 Atherosclerotic heart disease of native coronary artery with unstable angina pectoris: Secondary | ICD-10-CM | POA: Diagnosis not present

## 2018-01-27 DIAGNOSIS — D509 Iron deficiency anemia, unspecified: Secondary | ICD-10-CM | POA: Diagnosis not present

## 2018-01-27 DIAGNOSIS — I480 Paroxysmal atrial fibrillation: Secondary | ICD-10-CM | POA: Diagnosis not present

## 2018-01-27 DIAGNOSIS — E8809 Other disorders of plasma-protein metabolism, not elsewhere classified: Secondary | ICD-10-CM | POA: Diagnosis not present

## 2018-01-27 DIAGNOSIS — R768 Other specified abnormal immunological findings in serum: Secondary | ICD-10-CM | POA: Diagnosis not present

## 2018-01-27 DIAGNOSIS — Z8719 Personal history of other diseases of the digestive system: Secondary | ICD-10-CM

## 2018-01-27 NOTE — Telephone Encounter (Signed)
-----   Message from Herminio Commons, MD sent at 01/26/2018  1:07 PM EDT ----- No significant blockages.

## 2018-01-27 NOTE — Telephone Encounter (Signed)
Patient informed. 

## 2018-01-28 ENCOUNTER — Ambulatory Visit (HOSPITAL_COMMUNITY): Payer: BLUE CROSS/BLUE SHIELD

## 2018-01-31 ENCOUNTER — Ambulatory Visit (HOSPITAL_COMMUNITY): Payer: BLUE CROSS/BLUE SHIELD

## 2018-02-01 ENCOUNTER — Ambulatory Visit (INDEPENDENT_AMBULATORY_CARE_PROVIDER_SITE_OTHER): Payer: BLUE CROSS/BLUE SHIELD | Admitting: Pharmacist

## 2018-02-01 DIAGNOSIS — I480 Paroxysmal atrial fibrillation: Secondary | ICD-10-CM

## 2018-02-01 DIAGNOSIS — Z5181 Encounter for therapeutic drug level monitoring: Secondary | ICD-10-CM

## 2018-02-01 LAB — POCT INR: INR: 1.4 — AB (ref 2.0–3.0)

## 2018-02-01 NOTE — Patient Instructions (Signed)
Take 2 tablet today 02/01/2018, then continue with 1 tablet daily except 1.5 tablets each Monday and Friday.  Repeat INR in 8 days

## 2018-02-02 ENCOUNTER — Ambulatory Visit (HOSPITAL_COMMUNITY): Payer: BLUE CROSS/BLUE SHIELD

## 2018-02-04 ENCOUNTER — Ambulatory Visit (HOSPITAL_COMMUNITY): Payer: BLUE CROSS/BLUE SHIELD

## 2018-02-07 ENCOUNTER — Ambulatory Visit (HOSPITAL_COMMUNITY): Payer: BLUE CROSS/BLUE SHIELD

## 2018-02-09 ENCOUNTER — Ambulatory Visit (INDEPENDENT_AMBULATORY_CARE_PROVIDER_SITE_OTHER): Payer: BLUE CROSS/BLUE SHIELD | Admitting: Pharmacist Clinician (PhC)/ Clinical Pharmacy Specialist

## 2018-02-09 ENCOUNTER — Ambulatory Visit (HOSPITAL_COMMUNITY): Payer: BLUE CROSS/BLUE SHIELD

## 2018-02-09 DIAGNOSIS — I480 Paroxysmal atrial fibrillation: Secondary | ICD-10-CM

## 2018-02-09 DIAGNOSIS — Z7901 Long term (current) use of anticoagulants: Secondary | ICD-10-CM

## 2018-02-09 DIAGNOSIS — Z5181 Encounter for therapeutic drug level monitoring: Secondary | ICD-10-CM

## 2018-02-09 LAB — POCT INR: INR: 3.5 — AB (ref 2.0–3.0)

## 2018-02-09 NOTE — Patient Instructions (Signed)
Description   Take only 1/2 tablet today Wednesday July 3, then continue with 1 tablet daily except 1.5 tablets each Monday and Friday.  Repeat INR in 2 weeks

## 2018-02-09 NOTE — Progress Notes (Signed)
[facility] Note  02/14/2018     CHIEF COMPLAINT Patient presents for Retina Follow Up s/p 25g PPV/MP/EL/Gas OS for TRD and VH, 01/13/18  HISTORY OF PRESENT ILLNESS: Amber Young is a 62 y.o. female who presents to the clinic today for:   HPI    Retina Follow Up    Patient presents with  Diabetic Retinopathy.  In both eyes.  Severity is moderate.  Duration of 3 weeks.  Since onset it is stable.  I, the attending physician,  performed the HPI with the patient and updated documentation appropriately.          Comments    Pt presents for PDR OU F/U, pt states she can tell that the bubble is melting away, pt denies flashes, floaters, pain or wavy vision, pt states she used all her gtts until they ran out, pt did not check BS this AM       Last edited by Bernarda Caffey, MD on 02/14/2018 10:04 AM. (History)    Pt states she has not been using any gtts; Pt reports she "ran out and did no get a refill";   Referring physician: Azzie Glatter, FNP [place], [place] [address]  HISTORICAL INFORMATION:   Selected notes from the MEDICAL RECORD NUMBER Referred by Molli Barrows, FNP for DM exam;  LEE-  Ocular Hx-  PMH- DM (A1C - 9.4 (02.13.19)), CKD, HTN, CAD, CHF, hx of stroke    CURRENT MEDICATIONS: Current Outpatient Medications (Ophthalmic Drugs)  Medication Sig  . prednisoLONE acetate (PRED FORTE) 1 % ophthalmic suspension Place 1 drop into the left eye 4 (four) times daily.   No current facility-administered medications for this visit.  (Ophthalmic Drugs)   Current Outpatient Medications (Other)  Medication Sig  . amLODipine (NORVASC) 10 MG tablet Take 1 tablet (10 mg total) by mouth daily.  Marland Kitchen amoxicillin-clavulanate (AUGMENTIN) 875-125 MG tablet Take 1 tablet by mouth 2 (two) times daily.  Marland Kitchen atorvastatin (LIPITOR) 40 MG tablet Take 1 tablet (40 mg total) by mouth daily at 6 PM.  . carvedilol (COREG) 25 MG tablet Take 2 tablets (50  mg total) by mouth 2 (two) times daily.  . ferrous sulfate (FERROUSUL) 325 (65 FE) MG tablet Take 1 tablet (325 mg total) by mouth every other day. (Patient not taking: Reported on 01/27/2018)  . furosemide (LASIX) 40 MG tablet Take 1 tablet (40 mg total) by mouth daily.  . Glucose Blood (BLOOD GLUCOSE TEST STRIPS) STRP Use as directed  . hydrALAZINE (APRESOLINE) 50 MG tablet Take 1.5 tablets (75 mg total) by mouth 3 (three) times daily. (Patient taking differently: Take 50 mg by mouth 3 (three) times daily. )  . insulin glargine (LANTUS) 100 UNIT/ML injection Inject 0.1 mLs (10 Units total) into the skin 2 (two) times daily.  . insulin lispro (HUMALOG) 100 UNIT/ML injection Inject 0.05 mLs (5 Units total) into the skin 3 (three) times daily with meals.  . Insulin Pen Needle 29G X 10MM MISC Use as directed  . INSULIN SYRINGE .5CC/29G 29G X 1/2" 0.5 ML MISC Use to administer insulin three times daily  . losartan (COZAAR) 50 MG tablet Take 1 tablet (50 mg total) by mouth 2 (two) times daily.  . nitroGLYCERIN (NITROSTAT) 0.4 MG SL tablet Place 1 tablet (0.4 mg total) under the tongue every 5 (five) minutes x 3 doses as needed for chest pain.  . potassium chloride SA (K-DUR,KLOR-CON) 10 MEQ tablet Take 1  tablet with each dose of furosemide, daily. (Patient not taking: Reported on 01/24/2018)  . ticagrelor (BRILINTA) 90 MG TABS tablet Take 1 tablet (90 mg total) by mouth 2 (two) times daily.  Marland Kitchen warfarin (COUMADIN) 5 MG tablet Take 1 to 1.5 tablets by mouth daily as directed by coumadin clinic   Current Facility-Administered Medications (Other)  Medication Route  . Bevacizumab (AVASTIN) SOLN 1.25 mg Intravitreal      REVIEW OF SYSTEMS: ROS    Positive for: Endocrine, Cardiovascular, Eyes   Negative for: Constitutional, Gastrointestinal, Neurological, Skin, Genitourinary, Musculoskeletal, HENT, Respiratory, Psychiatric, Allergic/Imm, Heme/Lymph   Last edited by Debbrah Alar, COT on 02/14/2018  9:12  AM. (History)       ALLERGIES Allergies  Allergen Reactions  . Ace Inhibitors Cough    PAST MEDICAL HISTORY Past Medical History:  Diagnosis Date  . Anemia   . CHF (congestive heart failure) ([place])   . CKD (chronic kidney disease), stage III ([place])   . Coronary artery disease   . GERD (gastroesophageal reflux disease)   . Heart murmur   . Hyperlipidemia   . Hypertension   . Proliferative diabetic retinopathy ([place])    left eye with vitreous hemorrhage and tractional retinal detachment  . Stroke ([place]) 09/2014   numbness left upper lip, finger tips on left hand; "resolved" (05/18/2016)  . Type II diabetes mellitus ([place])   . Wears glasses    Past Surgical History:  Procedure Laterality Date  . CARDIAC CATHETERIZATION N/A 05/18/2016   Procedure: Left Heart Cath and Coronary Angiography;  Surgeon: Jettie Booze, MD;  Location: [facility];  Service: Cardiovascular;  Laterality: N/A;  . CARDIAC CATHETERIZATION N/A 05/18/2016   Procedure: Coronary Stent Intervention;  Surgeon: Jettie Booze, MD;  Location: [facility];  Service: Cardiovascular;  Laterality: N/A;  . COLONOSCOPY W/ BIOPSIES AND POLYPECTOMY    . CORONARY ANGIOPLASTY    . CORONARY ANGIOPLASTY WITH STENT PLACEMENT    . CORONARY STENT INTERVENTION N/A 09/07/2017   Procedure: CORONARY STENT INTERVENTION;  Surgeon: Leonie Man, MD;  Location: [facility];  Service: Cardiovascular;  Laterality: N/A;  . EYE SURGERY    . GAS INSERTION Left 01/13/2018   Procedure: INSERTION OF GAS;  Surgeon: Bernarda Caffey, MD;  Location: [facility];  Service: Ophthalmology;  Laterality: Left;  . LEFT HEART CATH AND CORONARY ANGIOGRAPHY N/A 09/07/2017   Procedure: LEFT HEART CATH AND CORONARY ANGIOGRAPHY;  Surgeon: Leonie Man, MD;  Location: [facility];  Service: Cardiovascular;  Laterality: N/A;  . MEMBRANE PEEL Left 01/13/2018   Procedure: MEMBRANE PEEL LEFT EYE ;  Surgeon: Bernarda Caffey, MD;  Location:  [facility];  Service: Ophthalmology;  Laterality: Left;  Marland Kitchen MULTIPLE TOOTH EXTRACTIONS    . PARS PLANA VITRECTOMY Left 01/13/2018   Procedure: PARS PLANA VITRECTOMY WITH 25 GAUGE LEFT EYE WITH ENDOLASER;  Surgeon: Bernarda Caffey, MD;  Location: [facility];  Service: Ophthalmology;  Laterality: Left;  . TUBAL LIGATION  1980  . VAGINAL HYSTERECTOMY  1999   "partial; fibroids"    FAMILY HISTORY Family History  Problem Relation Age of Onset  . Diabetes Mother   . Hypertension Mother   . COPD Mother   . Heart failure Mother   . Hypertension Sister   . Hypertension Brother     SOCIAL HISTORY Social History   Tobacco Use  . Smoking status: Never Smoker  . Smokeless tobacco: Never Used  Substance Use Topics  .  Alcohol use: No    Alcohol/week: 0.0 oz  . Drug use: No         OPHTHALMIC EXAM:  Base Eye Exam    Visual Acuity (Snellen - Linear)      Right Left   Dist [place] 20/80 20/60 -1   Dist ph [place] 20/80 +1 20/50 +1   Correction:  Glasses       Tonometry (Tonopen, 9:19 AM)      Right Left   Pressure 13 12       Pupils      Dark Light Shape React APD   Right 3 3 Round Slow None   Left 4 3.5 Round Slow None       Visual Fields (Counting fingers)      Left Right    Full Full       Extraocular Movement      Right Left    Full, Ortho Full, Ortho       Neuro/Psych    Oriented x3:  Yes   Mood/Affect:  Normal       Dilation    Both eyes:  1.0% Mydriacyl, 2.5% Phenylephrine @ 9:20 AM        Slit Lamp and Fundus Exam    Slit Lamp Exam      Right Left   Lids/Lashes Dermatochalasis - upper lid, mild Meibomian gland dysfunction Dermatochalasis - upper lid, Meibomian gland dysfunction   Conjunctiva/Sclera White and quiet White and quiet   Cornea Arcus, 1+ Punctate epithelial erosions, 1+ Guttata Arcus, 2+ Punctate epithelial erosions, EBMD, trace Guttata   Anterior Chamber Deep and quiet 1-2+ Cell/pigment   Iris Round and dilated, No NVI Round and dilated, No NVI, persistent  pupillary membrane   Lens 2-3+ Nuclear sclerosis, 2+ Cortical cataract, Vacuoles, iridescent refractile opacities 3+ Nuclear sclerosis, 2+ Cortical cataract, Vacuoles, iridescent refractile opacities, 1+ Posterior subcapsular cataract   Vitreous Vitreous syneresis, mild Asteroid hyalosis superiorly Post vitrectomy, VH cleared, 30-35% gas bubble       Fundus Exam      Right Left   Disc compact, Pink and Sharp pink and Sharp   C/D Ratio 0.5 0.5   Macula Blunted foveal reflex, +edema, +DBH, +superior exudate, Microaneurysms, Intraretinal hemorrhage  Blunted foveal reflex, Microaneurysms, Exudates   Vessels Tortuous, ? Early fine NVE Attenuated; sclerosis temporally   Periphery Attached, 360 DBH Flat under gas, 360 PRP in place; IRH superiorly, scattered dot hemorrhages          IMAGING AND PROCEDURES  Imaging and Procedures for 12/07/17  OCT, Retina - OU - Both Eyes       Right Eye Quality was borderline. Central Foveal Thickness: 611. Progression has worsened. Findings include abnormal foveal contour, intraretinal fluid, subretinal fluid, vitreomacular adhesion .   Left Eye Quality was poor. Central Foveal Thickness: 341. Progression has improved. Findings include intraretinal fluid, no SRF (Interval improvement of vitreous opacities; focal TRD at 1200, mild improvement in IRF).   Notes *Images captured and stored on drive  Diagnosis / Impression:  Central DME OU OD: stable to slightly worse from prior OS: improved but persistent IRF  Clinical management:  See below  Abbreviations: NFP - Normal foveal profile. CME - cystoid macular edema. PED - pigment epithelial detachment. IRF - intraretinal fluid. SRF - subretinal fluid. EZ - ellipsoid zone. ERM - epiretinal membrane. ORA - outer retinal atrophy. ORT - outer retinal tubulation. SRHM - subretinal hyper-reflective material  Intravitreal Injection, Pharmacologic Agent - OD - Right Eye       Time Out 02/14/2018.  10:26 AM. Confirmed correct patient, procedure, site, and patient consented.   Anesthesia Topical anesthesia was used. Anesthetic medications included Lidocaine 2%, Tetracaine 0.5%.   Procedure Preparation included 5% betadine to ocular surface, eyelid speculum. A supplied needle was used.   Injection: 1.25 mg Bevacizumab 1.25mg /0.62ml   NDC: 70360-001-02    Lot: [id number]     Expiration Date: 03/24/2018   Route: Intravitreal   Site: Right Eye   Waste: 0 mL  Post-op Post injection exam found visual acuity of at least counting fingers. The patient tolerated the procedure well. There were no complications. The patient received written and verbal post procedure care education.                 ASSESSMENT/PLAN:    ICD-10-CM   1. Proliferative diabetic retinopathy of both eyes with macular edema associated with type 2 diabetes mellitus (HCC) E11.3513 OCT, Retina - OU - Both Eyes    Intravitreal Injection, Pharmacologic Agent - OD - Right Eye    Bevacizumab (AVASTIN) SOLN 1.25 mg  2. Retinal edema H35.81 OCT, Retina - OU - Both Eyes  3. Vitreous hemorrhage of left eye (HCC) H43.12   4. 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 Essential hypertension I10   6. Hypertensive retinopathy of both eyes H35.033   7. Combined forms of age-related cataract of both eyes H25.813     1,2. Proliferative diabetic retinopathy with diabetic macular edema, OU - exam shows significant IRH, macular edema, fine NVE OU; preretinal and vitreous heme OS -- slowly improving OU - FA from prior shows NVE OU - OCT confirms diabetic macular edema, OU -- DME with minimal improvement - s/p IVA #1 OS (03.13.19), #2 (04.15.19), #3 (05.13.19), #4 (06.03.19) - s/p IVA #1 OD (03.15.19), #2 (04.15.19), #3 (05.13.19) - s/p PRP OS (04.05.19), PRP fill-in OS (04.30.19); OD will need PRP eventually - POW4 s/p 25g PPV/MP/EL/Gas OS under general anesthesia for VH and TRD repair OS (06.06.19)             -  doing well             - IOP good  - ran out of drops and has stopped everything             - restart   PF QID OS  - can use PSO ung prn OS             - avoid laying flat on back             - post op drop and positioning instructions reviewed             - tylenol/ibuprofen for pain - OCT today shows OD with persistent DME, OS with persistent but improved DME - recommend IVA OD #4 today (07.08.19), will hold IVA OS for now due to gas bubble OS - RBA of procedure discussed, questions answered - informed consent obtained and signed - see procedure note - F/U 1 month -- likely IVA OU  3. Vitreous hemorrhage OS - secondary to #1, above - s/p IVA OS as above - s/p PRP OS (04.05.19 and 04.30.19) -- incomplete due to [facility] - of note pt on warfarin  - s/p PPV as above  4. 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 Hypertensive retinopathy OU -  discussed importance of tight BP control - monitor   6. 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 - discussed diagnosis and progression - not yet visually significant - monitor for now   Ophthalmic Meds Ordered this visit:  Meds ordered this encounter  Medications  . prednisoLONE acetate (PRED FORTE) 1 % ophthalmic suspension    Sig: Place 1 drop into the left eye 4 (four) times daily.    Dispense:  10 mL    Refill:  1  . Bevacizumab (AVASTIN) SOLN 1.25 mg       Return in about 1 month (around 03/17/2018) for F/U PDR OD, POV OS, DFE, OCT.  There are no Patient Instructions on file for this visit.   Explained the diagnoses, plan, and follow up with the patient and they expressed understanding.  Patient expressed understanding of the importance of proper follow up care.   This document serves as a record of services personally performed by Gardiner Sleeper, MD, PhD. It  was created on their behalf by Catha Brow, [place], a certified ophthalmic assistant. The creation of this record is the provider's dictation and/or activities during the visit.  Electronically signed by: Catha Brow, [place]  07.03.19 12:52 PM   Gardiner Sleeper, M.D., Ph.D. Diseases & Surgery of the Retina and Vitreous [facility]  I have reviewed the above documentation for accuracy and completeness, and I agree with the above. Gardiner Sleeper, M.D., Ph.D. 02/14/18 12:52 PM   Abbreviations: M myopia (nearsighted); A astigmatism; H hyperopia (farsighted); P presbyopia; Mrx spectacle prescription;  CTL contact lenses; OD right eye; OS left eye; OU both eyes  XT exotropia; ET esotropia; PEK punctate epithelial keratitis; PEE punctate epithelial erosions; DES dry eye syndrome; MGD meibomian gland dysfunction; ATs artificial tears; PFAT's preservative free artificial tears; [place] nuclear sclerotic cataract; PSC posterior subcapsular cataract; ERM epi-retinal membrane; PVD posterior vitreous detachment; RD retinal detachment; DM diabetes mellitus; DR diabetic retinopathy; NPDR non-proliferative diabetic retinopathy; PDR proliferative diabetic retinopathy; CSME clinically significant macular edema; DME diabetic macular edema; dbh dot blot hemorrhages; CWS cotton wool spot; POAG primary open angle glaucoma; C/D cup-to-disc ratio; HVF humphrey visual field; GVF goldmann visual field; OCT optical coherence tomography; IOP intraocular pressure; BRVO Branch retinal vein occlusion; CRVO central retinal vein occlusion; CRAO central retinal artery occlusion; BRAO branch retinal artery occlusion; RT retinal tear; SB scleral buckle; PPV pars plana vitrectomy; VH Vitreous hemorrhage; PRP panretinal laser photocoagulation; IVK intravitreal kenalog; VMT vitreomacular traction; MH Macular hole;  NVD neovascularization of the disc; NVE neovascularization elsewhere; AREDS age related eye disease study;  ARMD age related macular degeneration; POAG primary open angle glaucoma; EBMD epithelial/anterior basement membrane dystrophy; ACIOL anterior chamber intraocular lens; IOL intraocular lens; PCIOL posterior chamber intraocular lens; Phaco/IOL phacoemulsification with intraocular lens placement; [place] photorefractive keratectomy; LASIK laser assisted in situ keratomileusis; HTN hypertension; DM diabetes mellitus; COPD chronic obstructive pulmonary disease

## 2018-02-11 ENCOUNTER — Ambulatory Visit (HOSPITAL_COMMUNITY): Payer: BLUE CROSS/BLUE SHIELD

## 2018-02-11 MED FILL — WARFARIN SODIUM 5 MG TABLET: 5 | 30 days supply | Qty: 34 | Fill #4

## 2018-02-14 ENCOUNTER — Ambulatory Visit (HOSPITAL_COMMUNITY): Payer: BLUE CROSS/BLUE SHIELD

## 2018-02-14 ENCOUNTER — Encounter (INDEPENDENT_AMBULATORY_CARE_PROVIDER_SITE_OTHER): Payer: Self-pay | Admitting: Ophthalmology

## 2018-02-14 ENCOUNTER — Ambulatory Visit (INDEPENDENT_AMBULATORY_CARE_PROVIDER_SITE_OTHER): Payer: BLUE CROSS/BLUE SHIELD | Admitting: Ophthalmology

## 2018-02-14 DIAGNOSIS — H3342 Traction detachment of retina, left eye: Secondary | ICD-10-CM

## 2018-02-14 DIAGNOSIS — E113513 Type 2 diabetes mellitus with proliferative diabetic retinopathy with macular edema, bilateral: Secondary | ICD-10-CM | POA: Diagnosis not present

## 2018-02-14 DIAGNOSIS — H25813 Combined forms of age-related cataract, bilateral: Secondary | ICD-10-CM

## 2018-02-14 DIAGNOSIS — H3581 Retinal edema: Secondary | ICD-10-CM

## 2018-02-14 DIAGNOSIS — H4312 Vitreous hemorrhage, left eye: Secondary | ICD-10-CM

## 2018-02-14 DIAGNOSIS — I10 Essential (primary) hypertension: Secondary | ICD-10-CM

## 2018-02-14 DIAGNOSIS — H35033 Hypertensive retinopathy, bilateral: Secondary | ICD-10-CM

## 2018-02-14 MED ORDER — PREDNISOLONE ACETATE 1 % OP SUSP: 1.0000 [drp] | Freq: Four times a day (QID) | OPHTHALMIC | 1 refills | Status: DC

## 2018-02-14 MED ORDER — BEVACIZUMAB CHEMO INJECTION 1.25MG/0.05ML SYRINGE FOR KALEIDOSCOPE
1.2500 mg | INTRAVITREAL | Status: DC
  Administered 2018-02-14: 1.25 mg via INTRAVITREAL

## 2018-02-14 MED FILL — PREDNISOLONE AC 1% EYE DROP: 1 | 37 days supply | Qty: 10 | Fill #0

## 2018-02-16 ENCOUNTER — Ambulatory Visit (HOSPITAL_COMMUNITY): Payer: BLUE CROSS/BLUE SHIELD

## 2018-02-18 ENCOUNTER — Ambulatory Visit: Payer: BLUE CROSS/BLUE SHIELD | Admitting: Gastroenterology

## 2018-02-18 ENCOUNTER — Ambulatory Visit (HOSPITAL_COMMUNITY): Payer: BLUE CROSS/BLUE SHIELD

## 2018-02-18 ENCOUNTER — Encounter: Payer: Self-pay | Admitting: Gastroenterology

## 2018-02-18 VITALS — BP 142/78 | HR 84 | Ht 66.0 in | Wt 186.1 lb

## 2018-02-18 DIAGNOSIS — I25110 Atherosclerotic heart disease of native coronary artery with unstable angina pectoris: Secondary | ICD-10-CM | POA: Diagnosis not present

## 2018-02-18 DIAGNOSIS — D509 Iron deficiency anemia, unspecified: Secondary | ICD-10-CM | POA: Diagnosis not present

## 2018-02-18 DIAGNOSIS — I255 Ischemic cardiomyopathy: Secondary | ICD-10-CM

## 2018-02-18 DIAGNOSIS — I480 Paroxysmal atrial fibrillation: Secondary | ICD-10-CM | POA: Diagnosis not present

## 2018-02-18 DIAGNOSIS — Z86010 Personal history of colonic polyps: Secondary | ICD-10-CM | POA: Diagnosis not present

## 2018-02-18 DIAGNOSIS — N183 Chronic kidney disease, stage 3 unspecified: Secondary | ICD-10-CM

## 2018-02-18 DIAGNOSIS — Z7901 Long term (current) use of anticoagulants: Secondary | ICD-10-CM

## 2018-02-18 NOTE — Patient Instructions (Signed)
If you are age 62 or older, your body mass index should be between 23-30. Your Body mass index is 30.04 kg/m. If this is out of the aforementioned range listed, please consider follow up with your Primary Care Provider.  If you are age 60 or younger, your body mass index should be between 19-25. Your Body mass index is 30.04 kg/m. If this is out of the aformentioned range listed, please consider follow up with your Primary Care Provider.   It was a pleasure to meet you today!  Dr. Loletha Carrow

## 2018-02-18 NOTE — Progress Notes (Addendum)
[facility] Consult Note:  History: Amber Young 02/18/2018  Referring physician: Cammie Sickle, NP  Reason for consult/chief complaint: Discuss Colon (She is here to establish care. States that she did have labs and stool studies due to "loosing blood")   Subjective  HPI:  Referred 12/22/17 by Cammie Sickle, NMP for colon cancer screening.  This is a very pleasant 62 year old woman initially referred for colon cancer screening.  She was not really sure why she was here or if she needed any testing.  I did an extensive chart review as noted.  And have discovered that she has chronic iron deficiency anemia for which she was recently evaluated by hematology.  At that visit, they felt the patient might not be up-to-date on colorectal cancer screening, and she was then referred to [place].  Amber Young reports having a colonoscopy with Dr.Jyothi Collene Mares in approximately 2013, and seems to recall that polyps were removed.  She does not recall getting a recall letter or notification from Dr. Lorie Apley office since then.  She denies abdominal pain, rectal bleeding, black tarry stool, nausea vomiting or early satiety.  From her cardiologist Dr. Bronson Ing on 01/18/18: "She underwent percutaneous coronary intervention with 2 drug-eluting stents placed to the proximal RCA on 09/07/17. There is a 40% proximal LAD stenosis and a previously placed mid LAD stent that was patent.  LVEDP was mildly elevated.   Echocardiogram 09/08/17 showed mild to moderately function, LVEF 40-45%, severe concentric LVH, multiple wall motion abnormalities, grade 3 diastolic dysfunction with elevated filling pressures, mild mitral regurgitation, pulmonary pressures 43 mmHg" And "Anemia of chronic disease: Hemoglobin 7.7 and MCV 75.7 on 01/13/18.  She has been taking ferrous sulfate every other day.  She denies hematochezia and melena and fecal occult blood testing was negative.  She will likely require a repeat  EGD/colonoscopy to evaluate for occult GI blood loss.  I previously made a referral to hematology who evaluated her and felt she had anemia of chronic disease. "  From Hematologist Dr. Lebron Conners 12/28/17: "63 y.o. female with progressive anemia of at least 3-year duration with chronic microcytosis and hypochromia.  Findings would suggest underlying iron deficiency, but iron panel obtained in Oct 2018 does not fully support the diagnosis based on ferritin of over 90.  Additional testing obtained during our last visit in the clinic shows no evidence of hemoglobinopathy.  Patient demonstrates stable iron levels which do not support diagnosis of iron deficiency at this time.   Overall, my conclusion is that patient has anemia of chronic disease with minimal contribution of anemia of iron deficiency at this time."   ROS:  Review of Systems  Constitutional: Negative for appetite change and unexpected weight change.  HENT: Negative for mouth sores and voice change.   Eyes: Negative for pain and redness.  Respiratory: Negative for cough and shortness of breath.   Cardiovascular: Negative for chest pain and palpitations.  Genitourinary: Negative for dysuria and hematuria.  Musculoskeletal: Negative for arthralgias and myalgias.  Skin: Negative for pallor and rash.  Neurological: Negative for weakness and headaches.  Hematological: Negative for adenopathy.     Past Medical History: Past Medical History:  Diagnosis Date  . Anemia   . CHF (congestive heart failure) ([place])   . CKD (chronic kidney disease), stage III ([place])   . Coronary artery disease   . GERD (gastroesophageal reflux disease)   . Heart murmur   . Hyperlipidemia   . Hypertension   . 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 Stroke ([place]) 09/2014   numbness left upper lip, finger tips on left hand; "resolved" (05/18/2016)  . Type II diabetes mellitus ([place])   .  Wears glasses      Past Surgical History: Past Surgical History:  Procedure Laterality Date  . CARDIAC CATHETERIZATION N/A 05/18/2016   Procedure: Left Heart Cath and Coronary Angiography;  Surgeon: Jettie Booze, MD;  Location: [facility];  Service: Cardiovascular;  Laterality: N/A;  . CARDIAC CATHETERIZATION N/A 05/18/2016   Procedure: Coronary Stent Intervention;  Surgeon: Jettie Booze, MD;  Location: [facility];  Service: Cardiovascular;  Laterality: N/A;  . COLONOSCOPY W/ BIOPSIES AND POLYPECTOMY    . CORONARY ANGIOPLASTY    . CORONARY ANGIOPLASTY WITH STENT PLACEMENT    . CORONARY STENT INTERVENTION N/A 09/07/2017   Procedure: CORONARY STENT INTERVENTION;  Surgeon: Leonie Man, MD;  Location: [facility];  Service: Cardiovascular;  Laterality: N/A;  . EYE SURGERY    . GAS INSERTION Left 01/13/2018   Procedure: INSERTION OF GAS;  Surgeon: Bernarda Caffey, MD;  Location: [facility];  Service: Ophthalmology;  Laterality: Left;  . LEFT HEART CATH AND CORONARY ANGIOGRAPHY N/A 09/07/2017   Procedure: LEFT HEART CATH AND CORONARY ANGIOGRAPHY;  Surgeon: Leonie Man, MD;  Location: [facility];  Service: Cardiovascular;  Laterality: N/A;  . MEMBRANE PEEL Left 01/13/2018   Procedure: MEMBRANE PEEL LEFT EYE ;  Surgeon: Bernarda Caffey, MD;  Location: [facility];  Service: Ophthalmology;  Laterality: Left;  Marland Kitchen MULTIPLE TOOTH EXTRACTIONS    . PARS PLANA VITRECTOMY Left 01/13/2018   Procedure: PARS PLANA VITRECTOMY WITH 25 GAUGE LEFT EYE WITH ENDOLASER;  Surgeon: Bernarda Caffey, MD;  Location: [facility];  Service: Ophthalmology;  Laterality: Left;  . TUBAL LIGATION  1980  . VAGINAL HYSTERECTOMY  1999   "partial; fibroids"     Family History: Family History  Problem Relation Age of Onset  . Diabetes Mother   . Hypertension Mother   . COPD Mother   . Heart failure Mother   . Hypertension Sister   . Hypertension Brother     Social History: Social History    Socioeconomic History  . Marital status: Married    Spouse name: Not on file  . Number of children: Not on file  . Years of education: 3  . Highest education level: Not on file  Occupational History  . Occupation: hairdresser  Social Needs  . Financial resource strain: Not on file  . Food insecurity:    Worry: Not on file    Inability: Not on file  . Transportation needs:    Medical: Not on file    Non-medical: Not on file  Tobacco Use  . Smoking status: Never Smoker  . Smokeless tobacco: Never Used  Substance and Sexual Activity  . Alcohol use: No    Alcohol/week: 0.0 oz  . Drug use: No  . Sexual activity: Yes  Lifestyle  . Physical activity:    Days per week: Not on file    Minutes per session: Not on file  . Stress: Not on file  Relationships  . Social connections:    Talks on phone: Not on file    Gets together: Not on file    Attends religious service: Not on file    Active member of club or organization: Not on file    Attends meetings of clubs or organizations:  Not on file    Relationship status: Not on file  Other Topics Concern  . Not on file  Social History Narrative   Married, 2 children, hairdresser   Caffeine use- occasional tea or coffee   Right handed    Allergies: Allergies  Allergen Reactions  . Ace Inhibitors Cough    Outpatient Meds: Current Outpatient Medications  Medication Sig Dispense Refill  . amLODipine (NORVASC) 10 MG tablet Take 1 tablet (10 mg total) by mouth daily. 90 tablet 2  . amoxicillin-clavulanate (AUGMENTIN) 875-125 MG tablet Take 1 tablet by mouth 2 (two) times daily. 20 tablet 0  . atorvastatin (LIPITOR) 40 MG tablet Take 1 tablet (40 mg total) by mouth daily at 6 PM. 90 tablet 1  . carvedilol (COREG) 25 MG tablet Take 2 tablets (50 mg total) by mouth 2 (two) times daily. 60 tablet 3  . ferrous sulfate (FERROUSUL) 325 (65 FE) MG tablet Take 1 tablet (325 mg total) by mouth every other day. 90 tablet 3  . furosemide  (LASIX) 40 MG tablet Take 1 tablet (40 mg total) by mouth daily. 90 tablet 3  . Glucose Blood (BLOOD GLUCOSE TEST STRIPS) STRP Use as directed 200 each 0  . hydrALAZINE (APRESOLINE) 50 MG tablet Take 1.5 tablets (75 mg total) by mouth 3 (three) times daily. (Patient taking differently: Take 50 mg by mouth 3 (three) times daily. ) 135 tablet 6  . insulin glargine (LANTUS) 100 UNIT/ML injection Inject 0.1 mLs (10 Units total) into the skin 2 (two) times daily. 20 mL 11  . insulin lispro (HUMALOG) 100 UNIT/ML injection Inject 0.05 mLs (5 Units total) into the skin 3 (three) times daily with meals. 30 mL 3  . Insulin Pen Needle 29G X 10MM MISC Use as directed 100 each 3  . INSULIN SYRINGE .5CC/29G 29G X 1/2" 0.5 ML MISC Use to administer insulin three times daily 200 each 5  . losartan (COZAAR) 50 MG tablet Take 1 tablet (50 mg total) by mouth 2 (two) times daily. 180 tablet 1  . nitroGLYCERIN (NITROSTAT) 0.4 MG SL tablet Place 1 tablet (0.4 mg total) under the tongue every 5 (five) minutes x 3 doses as needed for chest pain. 30 tablet 12  . potassium chloride SA (K-DUR,KLOR-CON) 10 MEQ tablet Take 1 tablet with each dose of furosemide, daily. 30 tablet 2  . prednisoLONE acetate (PRED FORTE) 1 % ophthalmic suspension Place 1 drop into the left eye 4 (four) times daily. 10 mL 1  . ticagrelor (BRILINTA) 90 MG TABS tablet Take 1 tablet (90 mg total) by mouth 2 (two) times daily. 180 tablet 3  . warfarin (COUMADIN) 5 MG tablet Take 1 to 1.5 tablets by mouth daily as directed by coumadin clinic 120 tablet 1   Current Facility-Administered Medications  Medication Dose Route Frequency Provider Last Rate Last Dose  . Bevacizumab (AVASTIN) SOLN 1.25 mg  1.25 mg Intravitreal  Bernarda Caffey, MD   1.25 mg at 02/14/18 1150      ___________________________________________________________________ Objective   Exam:  BP (!) 142/78   Pulse 84   Ht 5\' 6"  (1.676 m)   Wt 186 lb 2 oz (84.4 kg)   BMI 30.04 kg/m      General: this is a(n) well-appearing woman, pleasant and conversational  Eyes: sclera anicteric, no redness  ENT: oral mucosa moist without lesions, no cervical or supraclavicular lymphadenopathy, good dentition  CV: RRR without murmur, S1/S2, no JVD, mild peripheral edema  Resp: clear to  auscultation bilaterally, normal RR and effort noted  GI: soft, no tenderness, with active bowel sounds. No guarding or palpable organomegaly noted.  Skin; warm and dry, no rash or jaundice noted  Neuro: awake, alert and oriented x 3. Normal gross motor function and fluent speech  Labs:  CBC Latest Ref Rng & Units 01/24/2018 01/13/2018 12/13/2017  WBC 3.4 - 10.8 x10E3/uL 6.9 5.0 4.9  Hemoglobin 11.1 - 15.9 g/dL 8.4(L) 7.7(L) 8.4(L)  Hematocrit 34.0 - 46.6 % 27.2(L) 24.9(L) 26.6(L)  Platelets 150 - 450 x10E3/uL 356 319 270   Hgb 9.11 February 2017  CMP Latest Ref Rng & Units 01/24/2018 01/13/2018 12/13/2017  Glucose 65 - 99 mg/dL 253(H) 83 307(H)  BUN 8 - 27 mg/dL 21 25(H) 33(H)  Creatinine 0.57 - 1.00 mg/dL 1.48(H) 1.74(H) 1.71(H)  Sodium 134 - 144 mmol/L 141 142 139  Potassium 3.5 - 5.2 mmol/L 4.7 3.9 4.2  Chloride 96 - 106 mmol/L 108(H) 113(H) 108  CO2 20 - 29 mmol/L 22 22 25   Calcium 8.7 - 10.3 mg/dL 8.6(L) 8.4(L) 8.9  Total Protein 6.0 - 8.5 g/dL 6.4 - 7.1  Total Bilirubin 0.0 - 1.2 mg/dL <0.2 - 0.2  Alkaline Phos 39 - 117 IU/L 169(H) - 113  AST 0 - 40 IU/L 13 - 15  ALT 0 - 32 IU/L 30 - 9   GFR about 40  12/13/17: Iron 38, TIBC 259  Ferritin 77  05/2017:  Iron 40,  TIBC 308  Ferritin 92  FOBT negative 11/17/17   Assessment: Encounter Diagnoses  Name Primary?  . Personal history of colonic polyps Yes  . CKD (chronic kidney disease), stage III ([place])   . Coronary artery disease involving native coronary artery of native heart with unstable angina pectoris ([place])   . Chronic anticoagulation   . PAF (paroxysmal atrial fibrillation) ([place])   . Cardiomyopathy, ischemic   . Microcytic  anemia     After extensive discussion with the patient and chart review, it seems that the question is whether she is up-to-date on colorectal cancer screening.  I do not know the answer to that for certain, since it sounds like she had a routine colonoscopy in 2013, and there may have been polyps.  I will need to obtain records to see if in fact there were adenomatous polyps that would require surveillance colonoscopy at this time.  If so, I suspect it would be better if Brenae waited until she is a year out from her most recent stent placement so she could more safely be off Brilinta 5 days prior to procedure.  She will have long-standing need for Coumadin, and would of course need to be off that several days prior to procedure as well.  However, it is really a question of whether she needs a procedure, and when is the safest time for her to be off antiplatelet therapy for that procedure.  Plan:  We have had her sign a release form so we will hopefully get some records from Dr. Lorie Apley office and I can give her further advice.   Thank you for the courtesy of this consult.  Please call me with any questions or concerns.  Nelida Meuse III  CC: Cammie Sickle, NP   Addendum:  Records from Dr. Juanita Craver ;  Screening colonoscopy 04/2013 with a diminutive hyperplastic polyps, but poor preparation.  I will create a separate phone note to advise a repeat colonoscopy approximately Feb 2020 when patient will have been on DAPT for  a year after DES. - H. Danis 03/01/18

## 2018-02-21 ENCOUNTER — Ambulatory Visit (HOSPITAL_COMMUNITY): Payer: BLUE CROSS/BLUE SHIELD

## 2018-02-23 ENCOUNTER — Ambulatory Visit (INDEPENDENT_AMBULATORY_CARE_PROVIDER_SITE_OTHER): Payer: BLUE CROSS/BLUE SHIELD | Admitting: Pharmacist

## 2018-02-23 ENCOUNTER — Telehealth: Payer: Self-pay

## 2018-02-23 ENCOUNTER — Encounter: Payer: Self-pay | Admitting: Family Medicine

## 2018-02-23 ENCOUNTER — Ambulatory Visit (INDEPENDENT_AMBULATORY_CARE_PROVIDER_SITE_OTHER): Payer: BLUE CROSS/BLUE SHIELD | Admitting: Family Medicine

## 2018-02-23 ENCOUNTER — Ambulatory Visit (HOSPITAL_COMMUNITY): Payer: BLUE CROSS/BLUE SHIELD

## 2018-02-23 VITALS — BP 160/60 | HR 60 | Temp 98.2°F | Ht 66.0 in | Wt 191.0 lb

## 2018-02-23 DIAGNOSIS — R05 Cough: Secondary | ICD-10-CM

## 2018-02-23 DIAGNOSIS — I480 Paroxysmal atrial fibrillation: Secondary | ICD-10-CM

## 2018-02-23 DIAGNOSIS — E1165 Type 2 diabetes mellitus with hyperglycemia: Secondary | ICD-10-CM

## 2018-02-23 DIAGNOSIS — I10 Essential (primary) hypertension: Secondary | ICD-10-CM | POA: Diagnosis not present

## 2018-02-23 DIAGNOSIS — R42 Dizziness and giddiness: Secondary | ICD-10-CM

## 2018-02-23 DIAGNOSIS — D649 Anemia, unspecified: Secondary | ICD-10-CM

## 2018-02-23 DIAGNOSIS — Z09 Encounter for follow-up examination after completed treatment for conditions other than malignant neoplasm: Secondary | ICD-10-CM

## 2018-02-23 DIAGNOSIS — R7989 Other specified abnormal findings of blood chemistry: Secondary | ICD-10-CM

## 2018-02-23 DIAGNOSIS — Z5181 Encounter for therapeutic drug level monitoring: Secondary | ICD-10-CM | POA: Diagnosis not present

## 2018-02-23 DIAGNOSIS — R058 Other specified cough: Secondary | ICD-10-CM

## 2018-02-23 DIAGNOSIS — T464X5A Adverse effect of angiotensin-converting-enzyme inhibitors, initial encounter: Secondary | ICD-10-CM

## 2018-02-23 DIAGNOSIS — R8290 Unspecified abnormal findings in urine: Secondary | ICD-10-CM

## 2018-02-23 LAB — POCT URINALYSIS DIP (MANUAL ENTRY)
Bilirubin, UA: NEGATIVE
Glucose, UA: 250 mg/dL — AB
Ketones, POC UA: NEGATIVE mg/dL
Leukocytes, UA: NEGATIVE
Nitrite, UA: NEGATIVE
Protein Ur, POC: >=300 mg/dL — AB
Spec Grav, UA: 1.025 (ref 1.010–1.025)
Urobilinogen, UA: 0.2 E.U./dL
pH, UA: 5.5 (ref 5.0–8.0)

## 2018-02-23 LAB — POCT INR: INR: 1.9 — AB (ref 2.0–3.0)

## 2018-02-23 LAB — GLUCOSE, POCT (MANUAL RESULT ENTRY): POC Glucose: 243 mg/dl — AB (ref 70–99)

## 2018-02-23 MED ORDER — LOSARTAN POTASSIUM 50 MG PO TABS: 50.0000 mg | ORAL_TABLET | Freq: Two times a day (BID) | ORAL | 2 refills | Status: DC

## 2018-02-23 MED ORDER — LOSARTAN POTASSIUM 100 MG PO TABS: 100.0000 mg | ORAL_TABLET | Freq: Every day | ORAL | 1 refills | Status: DC

## 2018-02-23 MED FILL — LOSARTAN POTASSIUM 100 MG T: 100 | 30 days supply | Qty: 30 | Fill #0

## 2018-02-23 NOTE — Telephone Encounter (Signed)
Pharmacy states that insurance only pays for Losartan 100mg . So I sent a new script to pharmacy.

## 2018-02-23 NOTE — Patient Instructions (Signed)
Description   Take 2 tablets today, then continue with 1 tablet daily except 1.5 tablets each Monday and Friday.  Repeat INR in 2 weeks.

## 2018-02-23 NOTE — Progress Notes (Signed)
Subjective:    Patient ID: Amber Young, female    DOB: Apr 04, 1956, 62 y.o.   MRN: [id number]   PCP: Kathe Becton, NP  Chief Complaint  Patient presents with  . Follow-up    1 month on chronic conditions    HPI  Ms. Amber Young has a past medical history of Diabetes, Stroke, Hypertension, Hyperlipidemia, Heart Murmur, GERD, CAD, Anemia, and CHF. She is here today for follow up.   Current Status: Since her last office visit, she is doing well with no complaints. She has not been taking Losartan, because she did not receive a refill when she picked up her other medications. She continues to take Coumadin 10 mg M & F only, and Coumadin 5 mg all other days.   She has occasional night sweats. She denies fevers, chills, fatigue, recent infections, and weight loss.   She has not had any headaches, visual changes, and falls. Reports occasional dizziness.   Mild cough. No chest pain, but she experiences occasional shortness of breath on exertion, and heart palpitations.   No reports of GI problems such as nausea, vomiting, diarrhea, and constipation. She has no reports of blood in stools, dysuria and hematuria.   No depression or anxiety, and denies suicidal ideations, homicidal ideations, or auditory hallucinations.   She denies pain today.    Past Medical History:  Diagnosis Date  . Anemia   . CHF (congestive heart failure) ([place])   . CKD (chronic kidney disease), stage III ([place])   . Coronary artery disease   . GERD (gastroesophageal reflux disease)   . Heart murmur   . Hyperlipidemia   . Hypertension   . Proliferative diabetic retinopathy ([place])    left eye with vitreous hemorrhage and tractional retinal detachment  . Stroke ([place]) 09/2014   numbness left upper lip, finger tips on left hand; "resolved" (05/18/2016)  . Type II diabetes mellitus ([place])   . Wears glasses     Family History  Problem Relation Age of Onset  . Diabetes Mother   . Hypertension Mother   . COPD  Mother   . Heart failure Mother   . Hypertension Sister   . Hypertension Brother     Social History   Socioeconomic History  . Marital status: Married    Spouse name: Not on file  . Number of children: Not on file  . Years of education: 69  . Highest education level: Not on file  Occupational History  . Occupation: hairdresser  Social Needs  . Financial resource strain: Not on file  . Food insecurity:    Worry: Not on file    Inability: Not on file  . Transportation needs:    Medical: Not on file    Non-medical: Not on file  Tobacco Use  . Smoking status: Never Smoker  . Smokeless tobacco: Never Used  Substance and Sexual Activity  . Alcohol use: No    Alcohol/week: 0.0 oz  . Drug use: No  . Sexual activity: Yes  Lifestyle  . Physical activity:    Days per week: Not on file    Minutes per session: Not on file  . Stress: Not on file  Relationships  . Social connections:    Talks on phone: Not on file    Gets together: Not on file    Attends religious service: Not on file    Active member of club or organization: Not on file    Attends meetings of clubs or organizations: Not on  file    Relationship status: Not on file  . Intimate partner violence:    Fear of current or ex partner: Not on file    Emotionally abused: Not on file    Physically abused: Not on file    Forced sexual activity: Not on file  Other Topics Concern  . Not on file  Social History Narrative   Married, 2 children, hairdresser   Caffeine use- occasional tea or coffee   Right handed    Past Surgical History:  Procedure Laterality Date  . CARDIAC CATHETERIZATION N/A 05/18/2016   Procedure: Left Heart Cath and Coronary Angiography;  Surgeon: Jettie Booze, MD;  Location: [facility];  Service: Cardiovascular;  Laterality: N/A;  . CARDIAC CATHETERIZATION N/A 05/18/2016   Procedure: Coronary Stent Intervention;  Surgeon: Jettie Booze, MD;  Location: [facility];   Service: Cardiovascular;  Laterality: N/A;  . COLONOSCOPY W/ BIOPSIES AND POLYPECTOMY    . CORONARY ANGIOPLASTY    . CORONARY ANGIOPLASTY WITH STENT PLACEMENT    . CORONARY STENT INTERVENTION N/A 09/07/2017   Procedure: CORONARY STENT INTERVENTION;  Surgeon: Leonie Man, MD;  Location: [facility];  Service: Cardiovascular;  Laterality: N/A;  . EYE SURGERY    . GAS INSERTION Left 01/13/2018   Procedure: INSERTION OF GAS;  Surgeon: Bernarda Caffey, MD;  Location: [facility];  Service: Ophthalmology;  Laterality: Left;  . LEFT HEART CATH AND CORONARY ANGIOGRAPHY N/A 09/07/2017   Procedure: LEFT HEART CATH AND CORONARY ANGIOGRAPHY;  Surgeon: Leonie Man, MD;  Location: [facility];  Service: Cardiovascular;  Laterality: N/A;  . MEMBRANE PEEL Left 01/13/2018   Procedure: MEMBRANE PEEL LEFT EYE ;  Surgeon: Bernarda Caffey, MD;  Location: [facility];  Service: Ophthalmology;  Laterality: Left;  Marland Kitchen MULTIPLE TOOTH EXTRACTIONS    . PARS PLANA VITRECTOMY Left 01/13/2018   Procedure: PARS PLANA VITRECTOMY WITH 25 GAUGE LEFT EYE WITH ENDOLASER;  Surgeon: Bernarda Caffey, MD;  Location: [facility];  Service: Ophthalmology;  Laterality: Left;  . TUBAL LIGATION  1980  . VAGINAL HYSTERECTOMY  1999   "partial; fibroids"   Immunization History  Administered Date(s) Administered  . Tdap 01/05/2017    Current Meds  Medication Sig  . amLODipine (NORVASC) 10 MG tablet Take 1 tablet (10 mg total) by mouth daily.  Marland Kitchen atorvastatin (LIPITOR) 40 MG tablet Take 1 tablet (40 mg total) by mouth daily at 6 PM.  . carvedilol (COREG) 25 MG tablet Take 2 tablets (50 mg total) by mouth 2 (two) times daily.  . ferrous sulfate (FERROUSUL) 325 (65 FE) MG tablet Take 1 tablet (325 mg total) by mouth every other day.  . furosemide (LASIX) 40 MG tablet Take 1 tablet (40 mg total) by mouth daily.  . Glucose Blood (BLOOD GLUCOSE TEST STRIPS) STRP Use as directed  . hydrALAZINE (APRESOLINE) 50 MG tablet Take 1.5 tablets (75 mg total)  by mouth 3 (three) times daily. (Patient taking differently: Take 50 mg by mouth 3 (three) times daily. )  . insulin glargine (LANTUS) 100 UNIT/ML injection Inject 0.1 mLs (10 Units total) into the skin 2 (two) times daily.  . insulin lispro (HUMALOG) 100 UNIT/ML injection Inject 0.05 mLs (5 Units total) into the skin 3 (three) times daily with meals.  Marland Kitchen losartan (COZAAR) 50 MG tablet Take 1 tablet (50 mg total) by mouth 2 (two) times daily.  . potassium chloride SA (K-DUR,KLOR-CON) 10 MEQ tablet Take 1 tablet with  each dose of furosemide, daily.  . prednisoLONE acetate (PRED FORTE) 1 % ophthalmic suspension Place 1 drop into the left eye 4 (four) times daily.  . ticagrelor (BRILINTA) 90 MG TABS tablet Take 1 tablet (90 mg total) by mouth 2 (two) times daily.  Marland Kitchen warfarin (COUMADIN) 5 MG tablet Take 1 to 1.5 tablets by mouth daily as directed by coumadin clinic  . [DISCONTINUED] losartan (COZAAR) 50 MG tablet Take 1 tablet (50 mg total) by mouth 2 (two) times daily.   Current Facility-Administered Medications for the 02/23/18 encounter (Office Visit) with Azzie Glatter, FNP  Medication  . Bevacizumab (AVASTIN) SOLN 1.25 mg    BP (!) 160/60 (BP Location: Right Arm, Patient Position: Sitting, Cuff Size: Large)   Pulse 60   Temp 98.2 F (36.8 C) (Oral)   Ht 5\' 6"  (1.676 m)   Wt 191 lb (86.6 kg)   SpO2 99%   BMI 30.83 kg/m   Review of Systems  Constitutional: Negative.   HENT: Negative.   Eyes: Negative.   Respiratory: Negative.   Cardiovascular: Negative.   Gastrointestinal: Negative.   Endocrine: Negative.   Genitourinary: Negative.   Musculoskeletal: Negative.   Skin: Negative.   Allergic/Immunologic: Negative.   Neurological: Negative.   Hematological: Negative.   Psychiatric/Behavioral: Negative.    Objective:   Physical Exam  Constitutional: She is oriented to person, place, and time. She appears well-developed and well-nourished.  HENT:  Head: Normocephalic and  atraumatic.  Right Ear: External ear normal.  Left Ear: External ear normal.  Nose: Nose normal.  Mouth/Throat: Oropharynx is clear and moist.  Eyes: Pupils are equal, round, and reactive to light. Conjunctivae and EOM are normal.  Neck: Normal range of motion. Neck supple.  Cardiovascular: Normal rate, regular rhythm, normal heart sounds and intact distal pulses.  Pulmonary/Chest: Effort normal and breath sounds normal.  Abdominal: Soft. Bowel sounds are normal.  Musculoskeletal: Normal range of motion.  Neurological: She is alert and oriented to person, place, and time.  Skin: Skin is warm and dry. Capillary refill takes less than 2 seconds.  Psychiatric: She has a normal mood and affect. Her behavior is normal. Judgment and thought content normal.  Nursing note and vitals reviewed.  Assessment & Plan:   1. Uncontrolled hypertension Blood pressure is 160/60. She will re-initiate Losartan as prescribed. We will follow up with her in 1 month.  - losartan (COZAAR) 50 MG tablet; Take 1 tablet (50 mg total) by mouth 2 (two) times daily.  Dispense: 60 tablet; Refill: 2  - POCT urinalysis dipstick - losartan (COZAAR) 50 MG tablet; Take 1 tablet (50 mg total) by mouth 2 (two) times daily.  Dispense: 60 tablet; Refill: 2  2. Uncontrolled type 2 diabetes mellitus with hyperglycemia (HCC) Glucose level is elevated today at 243. Continue Lantus and  Humalog Insulin as prescribed. May initiate additional med at next office visit. She will continue lifestyle and diet modifications as instructed. She will decrease high sodium intake, excessive alcohol intake, increase potassium intake, smoking cessation, and increase physical activity to 30 minutes of cardio daily. She will follow DASH diet.  - POCT glucose (manual entry)  3. Abnormal urinalysis - Urine Culture  4. Dizziness Stable. She will continue to ambulate slowly to prevent falls. She will contact office if she requires medication for  dizziness.   5. Anemia, unspecified type Hgb stable at 8.4 today.   6. Hypocalcemia Calcium level improved at 8.6 on 01/24/2018, from 8.4 on 6.01/2018. She  will continue Calcium/Vitamin D supplement daily.   7. Elevated serum creatinine Creatinine at 1.48 on 01/24/2018. Monitor.   8. Cough due to ACE inhibitor Stable. Not worsening. Continue Losartan as prescribed.   9. Follow up She will follow up in 1 month.   Meds ordered this encounter  Medications  . losartan (COZAAR) 50 MG tablet    Sig: Take 1 tablet (50 mg total) by mouth 2 (two) times daily.    Dispense:  60 tablet    Refill:  [place],  MSN, FNP-C [facility] [address] [place], [place] [address] [phone]

## 2018-02-24 ENCOUNTER — Ambulatory Visit: Payer: BLUE CROSS/BLUE SHIELD | Admitting: Rheumatology

## 2018-02-25 LAB — URINE CULTURE

## 2018-03-01 ENCOUNTER — Telehealth: Payer: Self-pay | Admitting: Gastroenterology

## 2018-03-01 NOTE — Telephone Encounter (Signed)
Left a message to return call.  

## 2018-03-01 NOTE — Telephone Encounter (Signed)
I received and reviewed records from Dr. Lorie Apley office.  The patient had a screening colonoscopy Sept 2014 with only a hyperplastic polyp (no adenomas), BUT poor bowel prep.   Since there was a poor prep, she should not wait the usual 10 years for another colonoscopy.  I recommend a colonoscopy early next year.  That will be a year out from her coronary stent and therefore hopefully can be off Brilinta for procedure (see my recent office note for details). Please put Amber Young in for a recall colonoscopy Feb 2020 and let her know we will contact her at that time. She will then be brought in for an APP visit because also on coumadin. (incidentally, will most likely use Moviprep and some miralax the couple days prior as well since last prep poor).  - HD

## 2018-03-02 NOTE — Telephone Encounter (Signed)
Pt has been notified and aware. Recall has been made.

## 2018-03-09 MED FILL — BRILINTA 90 MG TABLET: 90 | 30 days supply | Qty: 60 | Fill #6

## 2018-03-10 ENCOUNTER — Other Ambulatory Visit: Payer: Self-pay | Admitting: Family Medicine

## 2018-03-10 DIAGNOSIS — B961 Klebsiella pneumoniae [K. pneumoniae] as the cause of diseases classified elsewhere: Secondary | ICD-10-CM

## 2018-03-10 DIAGNOSIS — N390 Urinary tract infection, site not specified: Secondary | ICD-10-CM

## 2018-03-10 DIAGNOSIS — R319 Hematuria, unspecified: Secondary | ICD-10-CM

## 2018-03-10 DIAGNOSIS — A498 Other bacterial infections of unspecified site: Secondary | ICD-10-CM

## 2018-03-10 MED ORDER — AMOXICILLIN-POT CLAVULANATE 875-125 MG PO TABS: 1.0000 | ORAL_TABLET | Freq: Two times a day (BID) | ORAL | 0 refills | Status: AC

## 2018-03-10 MED FILL — AMOX-CLAV 875-125 MG TABLET: 875-125 | 10 days supply | Qty: 20 | Fill #0

## 2018-03-10 NOTE — Progress Notes (Signed)
Rx for Augmentin to pharmacy today.

## 2018-03-11 ENCOUNTER — Telehealth: Payer: Self-pay

## 2018-03-11 ENCOUNTER — Ambulatory Visit (INDEPENDENT_AMBULATORY_CARE_PROVIDER_SITE_OTHER): Payer: BLUE CROSS/BLUE SHIELD | Admitting: Pharmacist

## 2018-03-11 DIAGNOSIS — I480 Paroxysmal atrial fibrillation: Secondary | ICD-10-CM

## 2018-03-11 DIAGNOSIS — Z5181 Encounter for therapeutic drug level monitoring: Secondary | ICD-10-CM

## 2018-03-11 LAB — POCT INR: INR: 2.1 (ref 2.0–3.0)

## 2018-03-11 NOTE — Telephone Encounter (Signed)
Patient notified

## 2018-03-11 NOTE — Telephone Encounter (Signed)
-----   Message from Azzie Glatter, [place] sent at 03/10/2018  4:44 PM EDT ----- Regarding: "Urine Culture Results" Morey Hummingbird,   Inform patient that Urine Culture revealed Bacterial Infection.   Rx for Augmentin to pharmacy today.   Patient is to take antibiotic as prescribed and complete all med. Medication may cause mild diarrhea. If diarrhea is > 3 days contact office. May take with or without food. Increase fluids while on medication.   Contact office if any adverse (bad) side effects.   Thanks .

## 2018-03-28 ENCOUNTER — Ambulatory Visit: Payer: BLUE CROSS/BLUE SHIELD | Admitting: Family Medicine

## 2018-03-28 ENCOUNTER — Encounter (INDEPENDENT_AMBULATORY_CARE_PROVIDER_SITE_OTHER): Payer: BLUE CROSS/BLUE SHIELD | Admitting: Ophthalmology

## 2018-03-30 ENCOUNTER — Encounter: Payer: Self-pay | Admitting: Family Medicine

## 2018-03-30 ENCOUNTER — Ambulatory Visit (INDEPENDENT_AMBULATORY_CARE_PROVIDER_SITE_OTHER): Payer: BLUE CROSS/BLUE SHIELD | Admitting: Family Medicine

## 2018-03-30 VITALS — BP 170/90 | HR 84 | Temp 98.8°F | Ht 66.0 in | Wt 184.0 lb

## 2018-03-30 DIAGNOSIS — E119 Type 2 diabetes mellitus without complications: Secondary | ICD-10-CM | POA: Diagnosis not present

## 2018-03-30 DIAGNOSIS — E1165 Type 2 diabetes mellitus with hyperglycemia: Secondary | ICD-10-CM | POA: Diagnosis not present

## 2018-03-30 DIAGNOSIS — R609 Edema, unspecified: Secondary | ICD-10-CM | POA: Diagnosis not present

## 2018-03-30 DIAGNOSIS — Z09 Encounter for follow-up examination after completed treatment for conditions other than malignant neoplasm: Secondary | ICD-10-CM

## 2018-03-30 DIAGNOSIS — I10 Essential (primary) hypertension: Secondary | ICD-10-CM | POA: Diagnosis not present

## 2018-03-30 DIAGNOSIS — R600 Localized edema: Secondary | ICD-10-CM

## 2018-03-30 LAB — POCT URINALYSIS DIP (MANUAL ENTRY)
Bilirubin, UA: NEGATIVE
Glucose, UA: 500 mg/dL — AB
Ketones, POC UA: NEGATIVE mg/dL
Leukocytes, UA: NEGATIVE
Nitrite, UA: NEGATIVE
Protein Ur, POC: >=300 mg/dL — AB
Spec Grav, UA: 1.025 (ref 1.010–1.025)
Urobilinogen, UA: 0.2 E.U./dL
pH, UA: 5.5 (ref 5.0–8.0)

## 2018-03-30 LAB — GLUCOSE, POCT (MANUAL RESULT ENTRY): POC Glucose: 214 mg/dl — AB (ref 70–99)

## 2018-03-30 MED ORDER — CLONIDINE HCL 0.1 MG PO TABS
0.1000 mg | ORAL_TABLET | Freq: Once | ORAL | Status: AC
  Administered 2018-03-30: 0.1 mg via ORAL

## 2018-03-30 MED ORDER — INSULIN GLARGINE 100 UNIT/ML ~~LOC~~ SOLN: 20.0000 [IU] | Freq: Two times a day (BID) | SUBCUTANEOUS | 11 refills | Status: DC

## 2018-03-30 NOTE — Progress Notes (Signed)
Cle Cle

## 2018-03-30 NOTE — Progress Notes (Addendum)
[facility] Note  03/31/2018     CHIEF COMPLAINT Patient presents for Retina Follow Up s/p 25g PPV/MP/EL/Gas OS for TRD and VH, 01/13/18  HISTORY OF PRESENT ILLNESS: Amber Young is a 62 y.o. female who presents to the clinic today for:   HPI    Retina Follow Up    Patient presents with  Diabetic Retinopathy.  In both eyes.  This started 2 months ago.  Severity is mild.  Since onset it is gradually improving.  I, the attending physician,  performed the HPI with the patient and updated documentation appropriately.          Comments    F/U PDR OD. Patient states"I don't feel like I'm in the dark anymore", Bubble is gone, but I have a file cover my Os otherwise vision has improved. BS 220 @ 530A , insulin given,BS 154. Denies visual changes with BS. Pt is using Pf OU QID.       Last edited by Bernarda Caffey, MD on 03/31/2018  9:46 AM. (History)    Pt reports gas bubble has disappeared x 1 week ago;   Referring physician: Azzie Glatter, [place], [place] [address]  HISTORICAL INFORMATION:   Selected notes from the MEDICAL RECORD NUMBER Referred by Molli Barrows, FNP for DM exam;  LEE-  Ocular Hx-  PMH- DM (A1C - 9.4 (02.13.19)), CKD, HTN, CAD, CHF, hx of stroke    CURRENT MEDICATIONS: Current Outpatient Medications (Ophthalmic Drugs)  Medication Sig  . prednisoLONE acetate (PRED FORTE) 1 % ophthalmic suspension Place 1 drop into the left eye 4 (four) times daily.   Current Facility-Administered Medications (Ophthalmic Drugs)  Medication Route  . aflibercept (EYLEA) SOLN 2 mg Intravitreal   Current Outpatient Medications (Other)  Medication Sig  . amLODipine (NORVASC) 10 MG tablet Take 1 tablet (10 mg total) by mouth daily.  Marland Kitchen atorvastatin (LIPITOR) 40 MG tablet Take 1 tablet (40 mg total) by mouth daily at 6 PM.  . carvedilol (COREG) 25 MG tablet Take 2 tablets (50 mg total) by mouth 2 (two) times daily.  . ferrous  sulfate (FERROUSUL) 325 (65 FE) MG tablet Take 1 tablet (325 mg total) by mouth every other day.  . furosemide (LASIX) 40 MG tablet Take 1 tablet (40 mg total) by mouth daily.  . Glucose Blood (BLOOD GLUCOSE TEST STRIPS) STRP Use as directed  . hydrALAZINE (APRESOLINE) 50 MG tablet Take 1.5 tablets (75 mg total) by mouth 3 (three) times daily. (Patient taking differently: Take 50 mg by mouth 3 (three) times daily. )  . insulin glargine (LANTUS) 100 UNIT/ML injection Inject 0.2 mLs (20 Units total) into the skin 2 (two) times daily.  . insulin lispro (HUMALOG) 100 UNIT/ML injection Inject 0.05 mLs (5 Units total) into the skin 3 (three) times daily with meals.  . Insulin Pen Needle 29G X 10MM MISC Use as directed  . INSULIN SYRINGE .5CC/29G 29G X 1/2" 0.5 ML MISC Use to administer insulin three times daily  . losartan (COZAAR) 100 MG tablet Take 1 tablet (100 mg total) by mouth daily.  Marland Kitchen losartan (COZAAR) 50 MG tablet Take 1 tablet (50 mg total) by mouth 2 (two) times daily.  . nitroGLYCERIN (NITROSTAT) 0.4 MG SL tablet Place 1 tablet (0.4 mg total) under the tongue every 5 (five) minutes x 3 doses as needed for chest pain.  . potassium chloride SA (K-DUR,KLOR-CON) 10 MEQ tablet Take 1 tablet with each  dose of furosemide, daily.  . ticagrelor (BRILINTA) 90 MG TABS tablet Take 1 tablet (90 mg total) by mouth 2 (two) times daily.  Marland Kitchen warfarin (COUMADIN) 5 MG tablet Take 1 to 1.5 tablets by mouth daily as directed by coumadin clinic   Current Facility-Administered Medications (Other)  Medication Route  . Bevacizumab (AVASTIN) SOLN 1.25 mg Intravitreal  . Bevacizumab (AVASTIN) SOLN 1.25 mg Intravitreal      REVIEW OF SYSTEMS: ROS    Positive for: Endocrine, Eyes   Negative for: Constitutional, Gastrointestinal, Neurological, Skin, Genitourinary, Musculoskeletal, HENT, Cardiovascular, Respiratory, Psychiatric, Allergic/Imm, Heme/Lymph   Last edited by Zenovia Jordan, LPN on 4/62/7035  0:09 AM.  (History)       ALLERGIES Allergies  Allergen Reactions  . Ace Inhibitors Cough    PAST MEDICAL HISTORY Past Medical History:  Diagnosis Date  . Anemia   . CHF (congestive heart failure) ([place])   . CKD (chronic kidney disease), stage III ([place])   . Coronary artery disease   . GERD (gastroesophageal reflux disease)   . Heart murmur   . Hyperlipidemia   . Hypertension   . Proliferative diabetic retinopathy ([place])    left eye with vitreous hemorrhage and tractional retinal detachment  . Stroke ([place]) 09/2014   numbness left upper lip, finger tips on left hand; "resolved" (05/18/2016)  . Type II diabetes mellitus ([place])   . Wears glasses    Past Surgical History:  Procedure Laterality Date  . CARDIAC CATHETERIZATION N/A 05/18/2016   Procedure: Left Heart Cath and Coronary Angiography;  Surgeon: Jettie Booze, MD;  Location: [facility];  Service: Cardiovascular;  Laterality: N/A;  . CARDIAC CATHETERIZATION N/A 05/18/2016   Procedure: Coronary Stent Intervention;  Surgeon: Jettie Booze, MD;  Location: [facility];  Service: Cardiovascular;  Laterality: N/A;  . COLONOSCOPY W/ BIOPSIES AND POLYPECTOMY    . CORONARY ANGIOPLASTY    . CORONARY ANGIOPLASTY WITH STENT PLACEMENT    . CORONARY STENT INTERVENTION N/A 09/07/2017   Procedure: CORONARY STENT INTERVENTION;  Surgeon: Leonie Man, MD;  Location: [facility];  Service: Cardiovascular;  Laterality: N/A;  . EYE SURGERY    . GAS INSERTION Left 01/13/2018   Procedure: INSERTION OF GAS;  Surgeon: Bernarda Caffey, MD;  Location: [facility];  Service: Ophthalmology;  Laterality: Left;  . LEFT HEART CATH AND CORONARY ANGIOGRAPHY N/A 09/07/2017   Procedure: LEFT HEART CATH AND CORONARY ANGIOGRAPHY;  Surgeon: Leonie Man, MD;  Location: [facility];  Service: Cardiovascular;  Laterality: N/A;  . MEMBRANE PEEL Left 01/13/2018   Procedure: MEMBRANE PEEL LEFT EYE ;  Surgeon: Bernarda Caffey, MD;  Location: [facility];  Service: Ophthalmology;  Laterality: Left;  Marland Kitchen MULTIPLE TOOTH EXTRACTIONS    . PARS PLANA VITRECTOMY Left 01/13/2018   Procedure: PARS PLANA VITRECTOMY WITH 25 GAUGE LEFT EYE WITH ENDOLASER;  Surgeon: Bernarda Caffey, MD;  Location: [facility];  Service: Ophthalmology;  Laterality: Left;  . TUBAL LIGATION  1980  . VAGINAL HYSTERECTOMY  1999   "partial; fibroids"    FAMILY HISTORY Family History  Problem Relation Age of Onset  . Diabetes Mother   . Hypertension Mother   . COPD Mother   . Heart failure Mother   . Hypertension Sister   . Hypertension Brother     SOCIAL HISTORY Social History   Tobacco Use  . Smoking status: Never Smoker  . Smokeless tobacco: Never Used  Substance Use Topics  . Alcohol  use: No    Alcohol/week: 0.0 standard drinks  . Drug use: No         OPHTHALMIC EXAM:  Base Eye Exam    Visual Acuity (Snellen - Linear)      Right Left   Dist cc 20/100 +2 20/70 +2   Dist ph cc NI NI   Correction:  Glasses       Tonometry (Tonopen, 9:08 AM)      Right Left   Pressure 15 23       Tonometry #2      Right Left   Pressure  24       Pupils      Dark Light Shape React APD   Right 4 3 Round Slow None   Left 4 3 Round Slow None       Visual Fields (Counting fingers)      Left Right    Full Full       Extraocular Movement      Right Left    Full, Ortho Full, Ortho       Neuro/Psych    Oriented x3:  Yes   Mood/Affect:  Normal       Dilation    Both eyes:  1.0% Mydriacyl, 2.5% Phenylephrine @ 9:08 AM        Slit Lamp and Fundus Exam    Slit Lamp Exam      Right Left   Lids/Lashes Dermatochalasis - upper lid, mild Meibomian gland dysfunction Dermatochalasis - upper lid, Meibomian gland dysfunction   Conjunctiva/Sclera White and quiet White and quiet   Cornea Arcus, 1+ Punctate epithelial erosions, 1+ pigmented Guttata, irregular tear film Arcus, 1+ Punctate epithelial erosions, EBMD, pigmented Guttata   Anterior Chamber Deep and quiet  Deep, 0.5+ pigment   Iris Round and dilated, No NVI Round and dilated, No NVI, persistent pupillary membrane   Lens 2-3+ Nuclear sclerosis, 2+ Cortical cataract, Vacuoles, iridescent refractile opacities 3+ Nuclear sclerosis, 2+ Cortical cataract, Vacuoles, iridescent refractile opacities, 1+ Posterior subcapsular cataract   Vitreous Vitreous syneresis, mild Asteroid hyalosis superiorly Post vitrectomy, +pigment in anterior vitreous       Fundus Exam      Right Left   Disc compact, Pink and Sharp pink and Sharp   C/D Ratio 0.5 0.5   Macula Blunted foveal reflex, +edema, +DBH, +exudate greatest superiorly, Microaneurysms, Intraretinal hemorrhage  Blunted foveal reflex, Microaneurysms, Exudates   Vessels Tortuous, ? Early fine NVE Attenuated; sclerosis temporally   Periphery Attached, 360 DBH Flat under gas, 360 PRP in place; IRH superiorly, scattered dot hemorrhages          IMAGING AND PROCEDURES  Imaging and Procedures for 12/07/17  OCT, Retina - OU - Both Eyes       Right Eye Quality was good. Central Foveal Thickness: 529. Progression has improved. Findings include abnormal foveal contour, intraretinal fluid, vitreomacular adhesion , no SRF, outer retinal atrophy (Interval improvement in IRF).   Left Eye Quality was good. Central Foveal Thickness: 318. Progression has improved. Findings include intraretinal fluid, no SRF (Interval improvement in IRF).   Notes *Images captured and stored on drive  Diagnosis / Impression:  Central DME OU OD: mild interval improvement in IRF OS: mild interval improvement in IRF  Clinical management:  See below  Abbreviations: NFP - Normal foveal profile. CME - cystoid macular edema. PED - pigment epithelial detachment. IRF - intraretinal fluid. SRF - subretinal fluid. EZ - ellipsoid zone. ERM - epiretinal membrane. ORA -  outer retinal atrophy. ORT - outer retinal tubulation. SRHM - subretinal hyper-reflective material          Intravitreal Injection, Pharmacologic Agent - OD - Right Eye       Time Out 03/31/2018. 10:25 AM. Confirmed correct patient, procedure, site, and patient consented.   Anesthesia Topical anesthesia was used. Anesthetic medications included Lidocaine 2%, Tetracaine 0.5%.   Procedure Preparation included 5% betadine to ocular surface, eyelid speculum. A supplied needle was used.   Injection:  2 mg aflibercept 2 MG/0.05ML   NDC: 61755-005-55, Lot: [id number], Expiration date: 03/09/2019   Route: Intravitreal, Site: Right Eye, Waste: 0.05 mL  Post-op Post injection exam found visual acuity of at least counting fingers. The patient tolerated the procedure well. There were no complications. The patient received written and verbal post procedure care education.   Notes ** SAMPLE MEDICATION ADMINISTERED **       Intravitreal Injection, Pharmacologic Agent - OS - Left Eye       Time Out 03/31/2018. 10:15 AM. Confirmed correct patient, procedure, site, and patient consented.   Anesthesia Topical anesthesia was used. Anesthetic medications included Lidocaine 2%, Tetracaine 0.5%.   Procedure Preparation included eyelid speculum, 5% betadine to ocular surface. A supplied needle was used.   Injection:  1.25 mg Bevacizumab 1.25mg /0.72ml   NDC: 09811-914-78, Lot: [id number], Expiration date: 03/09/2019   Route: Intravitreal, Site: Left Eye, Waste: 0 mL  Post-op Post injection exam found visual acuity of at least counting fingers. The patient tolerated the procedure well. There were no complications. The patient received written and verbal post procedure care education.                 ASSESSMENT/PLAN:    ICD-10-CM   1. Proliferative diabetic retinopathy of both eyes with macular edema associated with type 2 diabetes mellitus (HCC) E11.3513 OCT, Retina - OU - Both Eyes    Intravitreal Injection, Pharmacologic Agent - OD - Right Eye    Intravitreal Injection, Pharmacologic  Agent - OS - Left Eye    aflibercept (EYLEA) SOLN 2 mg    Bevacizumab (AVASTIN) SOLN 1.25 mg  2. Retinal edema H35.81 OCT, Retina - OU - Both Eyes  3. Vitreous hemorrhage of left eye (HCC) H43.12   4. 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 Essential hypertension I10   6. Hypertensive retinopathy of both eyes H35.033   7. Combined forms of age-related cataract of both eyes H25.813     1,2. Proliferative diabetic retinopathy with diabetic macular edema, OU - exam shows significant IRH, macular edema, fine NVE OU; preretinal and vitreous heme OS -- improving OU - FA from prior shows NVE OU - OCT confirms diabetic macular edema, OU -- DME with minimal improvement - s/p IVA #1 OS (03.13.19), #2 (04.15.19), #3 (05.13.19), #4 (06.03.19) - s/p IVA #1 OD (03.15.19), #2 (04.15.19), #3 (05.13.19), #4 (07.08.19) - s/p PRP OS (04.05.19), PRP fill-in OS (04.30.19); OD will need PRP eventually - s/p 25g PPV/MP/EL/Gas OS under general anesthesia for VH and TRD repair OS (06.06.19)             - doing well             - IOP mildly elevated             - restart cosopt BID OS  - cont PF QID OS  - can use PSO ung prn OS             - gas bubble  gone, able to resume laying on back and okay to remove green gas bracelet             - post op drops reviewed - OCT today shows OD with persistent DME--mildly improved from prior, OS with mild persistent DME - recommend IVE OD #1 today (08.22.19) -- sample, IVA OS #5 today (08.22.19) - RBA of procedure discussed, questions answered - informed consent obtained and signed - see procedure note - Eylea paperwork and benefits investigation initiated on 08.22.19 - F/U 4 wks  3. Vitreous hemorrhage OS - secondary to #1, above - s/p IVA OS as above - s/p PRP OS (04.05.19 and 04.30.19) -- incomplete due to [facility] - of note pt on warfarin  - s/p PPV as above  4. 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 Hypertensive retinopathy OU - discussed importance of tight BP control - monitor   6. 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 - discussed diagnosis and progression - approaching visual significance - discussed likelihood of cataract progression OS following PPV - monitor for now   Ophthalmic Meds Ordered this visit:  Meds ordered this encounter  Medications  . aflibercept (EYLEA) SOLN 2 mg  . Bevacizumab (AVASTIN) SOLN 1.25 mg       Return in about 4 weeks (around 04/28/2018) for Dilated Exam, OCT, Possible Injxn, POV.  There are no Patient Instructions on file for this visit.   Explained the diagnoses, plan, and follow up with the patient and they expressed understanding.  Patient expressed understanding of the importance of proper follow up care.   This document serves as a record of services personally performed by Gardiner Sleeper, MD, PhD. It was created on their behalf by Catha Brow, [place], a certified ophthalmic assistant. The creation of this record is the provider's dictation and/or activities during the visit.  Electronically signed by: Catha Brow, COA  08.21.19 11:41 AM   Gardiner Sleeper, M.D., Ph.D. Diseases & Surgery of the Retina and Vitreous [facility]   I have reviewed the above documentation for accuracy and completeness, and I agree with the above. Gardiner Sleeper, M.D., Ph.D. 03/31/18 11:41 AM    Abbreviations: M myopia (nearsighted); A astigmatism; H hyperopia (farsighted); P presbyopia; Mrx spectacle prescription;  CTL contact lenses; OD right eye; OS left eye; OU both eyes  XT exotropia; ET esotropia; PEK punctate epithelial keratitis; PEE punctate epithelial erosions; DES dry eye syndrome; MGD meibomian gland dysfunction; ATs artificial tears; PFAT's preservative free artificial tears; [place] nuclear  sclerotic cataract; PSC posterior subcapsular cataract; ERM epi-retinal membrane; PVD posterior vitreous detachment; RD retinal detachment; DM diabetes mellitus; DR diabetic retinopathy; NPDR non-proliferative diabetic retinopathy; PDR proliferative diabetic retinopathy; CSME clinically significant macular edema; DME diabetic macular edema; dbh dot blot hemorrhages; CWS cotton wool spot; POAG primary open angle glaucoma; C/D cup-to-disc ratio; HVF humphrey visual field; GVF goldmann visual field; OCT optical coherence tomography; IOP intraocular pressure; BRVO Branch retinal vein occlusion; CRVO central retinal vein occlusion; CRAO central retinal artery occlusion; BRAO branch retinal artery occlusion; RT retinal tear; SB scleral buckle; PPV pars plana vitrectomy; VH Vitreous hemorrhage; PRP panretinal laser photocoagulation; IVK intravitreal kenalog; VMT vitreomacular traction; MH Macular hole;  NVD neovascularization of the disc; NVE neovascularization elsewhere; AREDS age related eye disease study; ARMD age related macular  degeneration; POAG primary open angle glaucoma; EBMD epithelial/anterior basement membrane dystrophy; ACIOL anterior chamber intraocular lens; IOL intraocular lens; PCIOL posterior chamber intraocular lens; Phaco/IOL phacoemulsification with intraocular lens placement; [place] photorefractive keratectomy; LASIK laser assisted in situ keratomileusis; HTN hypertension; DM diabetes mellitus; COPD chronic obstructive pulmonary disease

## 2018-03-30 NOTE — Progress Notes (Signed)
Follow Up   Subjective:    Patient ID: Amber Young, female    DOB: Dec 29, 1955, 62 y.o.   MRN: [id number]  Chief Complaint  Patient presents with  . Follow-up    chronic condition    HPI Amber Young has a past medical history of Diabetes, Stroke, Hypertension, Hyperlipidemia, Heart Murmur, GERD, CAD, CKD, CHF, and Anemia. She is here today for follow up.   Current Status: Since her last office visit, she fells well but has reports of a decrease in her appetite and increased fatigue. Her blood pressure is elevated today. She has not taken her antihypertensive medications today. She denies visual changes, chest pain, heart palpitations, and falls. She has occasionally headaches and dizziness with position changes. Denies severe headaches, confusion, seizures, double vision, and blurred vision, nausea and vomiting.  She has checks her blood glucose 2 times a day, before she injects Lantus. She does not use Novolog because she states it decreases her blood glucose too low. She is compliant with all other medications. She denies increased thirst, frequent urination, hunger, fatigue, blurred vision, excessive hunger, excessive thirst, weight gain, weight loss, and poor wound healing.   She denies fevers, chills, recent infections, weight loss, and night sweats.   She has occasional cough and shortness of breath. She has not had any unsteadiness and falls.   No reports of GI problems such as diarrhea, and constipation. She has no reports of blood in stools, dysuria and hematuria.   No depression or anxiety reported.   She denies pain today.   Past Medical History:  Diagnosis Date  . Anemia   . CHF (congestive heart failure) ([place])   . CKD (chronic kidney disease), stage III ([place])   . Coronary artery disease   . GERD (gastroesophageal reflux disease)   . Heart murmur   . Hyperlipidemia   . Hypertension   . 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 Stroke ([place]) 09/2014   numbness left upper lip, finger tips on left hand; "resolved" (05/18/2016)  . Type II diabetes mellitus ([place])   . Wears glasses     Family History  Problem Relation Age of Onset  . Diabetes Mother   . Hypertension Mother   . COPD Mother   . Heart failure Mother   . Hypertension Sister   . Hypertension Brother     Social History   Socioeconomic History  . Marital status: Married    Spouse name: Not on file  . Number of children: Not on file  . Years of education: 73  . Highest education level: Not on file  Occupational History  . Occupation: hairdresser  Social Needs  . Financial resource strain: Not on file  . Food insecurity:    Worry: Not on file    Inability: Not on file  . Transportation needs:    Medical: Not on file    Non-medical: Not on file  Tobacco Use  . Smoking status: Never Smoker  . Smokeless tobacco: Never Used  Substance and Sexual Activity  . Alcohol use: No    Alcohol/week: 0.0 standard drinks  . Drug use: No  . Sexual activity: Yes  Lifestyle  . Physical activity:    Days per week: Not on file    Minutes per session: Not on file  . Stress: Not on file  Relationships  . Social connections:    Talks on phone: Not on file  Gets together: Not on file    Attends religious service: Not on file    Active member of club or organization: Not on file    Attends meetings of clubs or organizations: Not on file    Relationship status: Not on file  . Intimate partner violence:    Fear of current or ex partner: Not on file    Emotionally abused: Not on file    Physically abused: Not on file    Forced sexual activity: Not on file  Other Topics Concern  . Not on file  Social History Narrative   Married, 2 children, hairdresser   Caffeine use- occasional tea or coffee   Right handed    Past Surgical History:  Procedure Laterality Date  . CARDIAC CATHETERIZATION N/A 05/18/2016    Procedure: Left Heart Cath and Coronary Angiography;  Surgeon: Jettie Booze, MD;  Location: [facility];  Service: Cardiovascular;  Laterality: N/A;  . CARDIAC CATHETERIZATION N/A 05/18/2016   Procedure: Coronary Stent Intervention;  Surgeon: Jettie Booze, MD;  Location: [facility];  Service: Cardiovascular;  Laterality: N/A;  . COLONOSCOPY W/ BIOPSIES AND POLYPECTOMY    . CORONARY ANGIOPLASTY    . CORONARY ANGIOPLASTY WITH STENT PLACEMENT    . CORONARY STENT INTERVENTION N/A 09/07/2017   Procedure: CORONARY STENT INTERVENTION;  Surgeon: Leonie Man, MD;  Location: [facility];  Service: Cardiovascular;  Laterality: N/A;  . EYE SURGERY    . GAS INSERTION Left 01/13/2018   Procedure: INSERTION OF GAS;  Surgeon: Bernarda Caffey, MD;  Location: [facility];  Service: Ophthalmology;  Laterality: Left;  . LEFT HEART CATH AND CORONARY ANGIOGRAPHY N/A 09/07/2017   Procedure: LEFT HEART CATH AND CORONARY ANGIOGRAPHY;  Surgeon: Leonie Man, MD;  Location: [facility];  Service: Cardiovascular;  Laterality: N/A;  . MEMBRANE PEEL Left 01/13/2018   Procedure: MEMBRANE PEEL LEFT EYE ;  Surgeon: Bernarda Caffey, MD;  Location: [facility];  Service: Ophthalmology;  Laterality: Left;  Marland Kitchen MULTIPLE TOOTH EXTRACTIONS    . PARS PLANA VITRECTOMY Left 01/13/2018   Procedure: PARS PLANA VITRECTOMY WITH 25 GAUGE LEFT EYE WITH ENDOLASER;  Surgeon: Bernarda Caffey, MD;  Location: [facility];  Service: Ophthalmology;  Laterality: Left;  . TUBAL LIGATION  1980  . VAGINAL HYSTERECTOMY  1999   "partial; fibroids"   Immunization History  Administered Date(s) Administered  . Tdap 01/05/2017    Current Meds  Medication Sig  . amLODipine (NORVASC) 10 MG tablet Take 1 tablet (10 mg total) by mouth daily.  Marland Kitchen atorvastatin (LIPITOR) 40 MG tablet Take 1 tablet (40 mg total) by mouth daily at 6 PM.  . carvedilol (COREG) 25 MG tablet Take 2 tablets (50 mg total) by mouth 2 (two) times daily.  .  ferrous sulfate (FERROUSUL) 325 (65 FE) MG tablet Take 1 tablet (325 mg total) by mouth every other day.  . furosemide (LASIX) 40 MG tablet Take 1 tablet (40 mg total) by mouth daily.  . hydrALAZINE (APRESOLINE) 50 MG tablet Take 1.5 tablets (75 mg total) by mouth 3 (three) times daily. (Patient taking differently: Take 50 mg by mouth 3 (three) times daily. )  . insulin glargine (LANTUS) 100 UNIT/ML injection Inject 0.2 mLs (20 Units total) into the skin 2 (two) times daily.  . insulin lispro (HUMALOG) 100 UNIT/ML injection Inject 0.05 mLs (5 Units total) into the skin 3 (three) times daily with meals.  . Insulin Pen Needle 29G X  10MM MISC Use as directed  . INSULIN SYRINGE .5CC/29G 29G X 1/2" 0.5 ML MISC Use to administer insulin three times daily  . losartan (COZAAR) 50 MG tablet Take 1 tablet (50 mg total) by mouth 2 (two) times daily.  . potassium chloride SA (K-DUR,KLOR-CON) 10 MEQ tablet Take 1 tablet with each dose of furosemide, daily.  . prednisoLONE acetate (PRED FORTE) 1 % ophthalmic suspension Place 1 drop into the left eye 4 (four) times daily.  . ticagrelor (BRILINTA) 90 MG TABS tablet Take 1 tablet (90 mg total) by mouth 2 (two) times daily.  Marland Kitchen warfarin (COUMADIN) 5 MG tablet Take 1 to 1.5 tablets by mouth daily as directed by coumadin clinic  . [DISCONTINUED] insulin glargine (LANTUS) 100 UNIT/ML injection Inject 0.1 mLs (10 Units total) into the skin 2 (two) times daily.   Current Facility-Administered Medications for the 03/30/18 encounter (Office Visit) with Azzie Glatter, FNP  Medication  . aflibercept (EYLEA) SOLN 2 mg  . Bevacizumab (AVASTIN) SOLN 1.25 mg  . Bevacizumab (AVASTIN) SOLN 1.25 mg    Allergies  Allergen Reactions  . Ace Inhibitors Cough    BP (!) 170/90   Pulse 84   Temp 98.8 F (37.1 C) (Oral)   Ht 5\' 6"  (1.676 m)   Wt 184 lb (83.5 kg)   SpO2 98%   BMI 29.70 kg/m     Review of Systems  Constitutional: Positive for appetite change (mildly  decreased; r/t recent chemo treatment) and fatigue (increased).  HENT: Negative.   Eyes: Negative.   Respiratory: Positive for cough and shortness of breath.   Cardiovascular: Positive for leg swelling (Bilateral peripheral edema).  Gastrointestinal: Negative.   Endocrine: Negative.   Genitourinary: Negative.   Musculoskeletal: Negative.   Skin: Negative.   Allergic/Immunologic: Negative.   Neurological: Positive for dizziness.  Hematological: Negative.   Psychiatric/Behavioral: Negative.    Objective:   Physical Exam  Constitutional: She appears well-developed and well-nourished.  HENT:  Head: Normocephalic and atraumatic.  Right Ear: External ear normal.  Left Ear: External ear normal.  Nose: Nose normal.  Mouth/Throat: Oropharynx is clear and moist.  Eyes: Pupils are equal, round, and reactive to light. Conjunctivae and EOM are normal.  Neck: Normal range of motion. Neck supple.  Cardiovascular: Normal rate, regular rhythm, normal heart sounds and intact distal pulses.  Pulmonary/Chest: Effort normal and breath sounds normal.  Abdominal: Soft. Bowel sounds are normal.  Musculoskeletal: Normal range of motion.  Neurological: She is alert.  Skin: Skin is warm and dry. Capillary refill takes less than 2 seconds.  Psychiatric: She has a normal mood and affect. Her behavior is normal. Judgment and thought content normal.  Nursing note and vitals reviewed.  Assessment & Plan:   1. Uncontrolled type 2 diabetes mellitus with hyperglycemia (HCC) Improving. Glucose is increased today at 214 today. We will increase Latus to 20 mg BID today. We will follow up in 2 weeks. She will continue to decrease foods/beverages high in sugars and carbs and follow Heart Healthy or DASH diet. Increase physical activity to at least 30 minutes cardio exercise daily.   - POCT glycosylated hemoglobin (Hb A1C) - POCT urinalysis dipstick - POCT glucose (manual entry) - insulin glargine (LANTUS) 100  UNIT/ML injection; Inject 0.2 mLs (20 Units total) into the skin 2 (two) times daily.  Dispense: 20 mL; Refill: 11  2. Hypertension, unspecified type Blood pressure is elevated at 170/90 today. We will increase Lasix to 80 mg. She will continue  to decrease high sodium intake, excessive alcohol intake, increase potassium intake, smoking cessation, and increase physical activity of at least 30 minutes of cardio activity daily. She will continue to follow Heart Healthy or DASH diet. - cloNIDine (CATAPRES) tablet 0.2 mg  3. Peripheral edema 2+ edema in bilateral lower extremities. We will increase Lasix to 80 mg today, and re-evaluate in 2 weeks.   4. Comprehensive diabetic foot examination, type 2 DM, encounter for [facility]) Foot exam is normal has normal sensitivity. We will re-assess in 1 year.   5. Follow up She will follow up in 2 weeks to assess hypertension.  Meds ordered this encounter  Medications  . cloNIDine (CATAPRES) tablet 0.1 mg  . insulin glargine (LANTUS) 100 UNIT/ML injection    Sig: Inject 0.2 mLs (20 Units total) into the skin 2 (two) times daily.    Dispense:  20 mL    Refill:  11    Changing dose   Kathe Becton,  MSN, FNP-C Patient [facility] [address] [place], [place] [address] [phone]

## 2018-03-30 NOTE — Patient Instructions (Signed)
Insulin Glargine injection   Increase Lantus to 20 units in morning and at night.      What is this medicine? INSULIN GLARGINE (IN su lin GLAR geen) is a human-made form of insulin. This drug lowers the amount of sugar in your blood. It is a long-acting insulin that is usually given once a day. This medicine may be used for other purposes; ask your health care provider or pharmacist if you have questions. COMMON BRAND NAME(S): BASAGLAR, Lantus, Lantus SoloStar, Toujeo SoloStar What should I tell my health care provider before I take this medicine? They need to know if you have any of these conditions: -episodes of low blood sugar -kidney disease -liver disease -an unusual or allergic reaction to insulin, metacresol, other medicines, foods, dyes, or preservatives -pregnant or trying to get pregnant -breast-feeding How should I use this medicine? This medicine is for injection under the skin. Use this medicine at the same time each day. Use exactly as directed. This insulin should never be mixed in the same syringe with other insulins before injection. Do not vigorously shake before use. You will be taught how to use this medicine and how to adjust doses for activities and illness. Do not use more insulin than prescribed. Always check the appearance of your insulin before using it. This medicine should be clear and colorless like water. Do not use it if it is cloudy, thickened, colored, or has solid particles in it. It is important that you put your used needles and syringes in a special sharps container. Do not put them in a trash can. If you do not have a sharps container, call your pharmacist or healthcare provider to get one. Talk to your pediatrician regarding the use of this medicine in children. Special care may be needed. Overdosage: If you think you have taken too much of this medicine contact a poison control center or emergency room at once. NOTE: This medicine is only for you. Do  not share this medicine with others. What if I miss a dose? It is important not to miss a dose. Your health care professional or doctor should discuss a plan for missed doses with you. If you do miss a dose, follow their plan. Do not take double doses. What may interact with this medicine? -other medicines for diabetes Many medications may cause changes in blood sugar, these include: -alcohol containing beverages -antiviral medicines for HIV or AIDS -aspirin and aspirin-like drugs -certain medicines for blood pressure, heart disease, irregular heart beat -chromium -diuretics -female hormones, such as estrogens or progestins, birth control pills -fenofibrate -gemfibrozil -isoniazid -lanreotide -female hormones or anabolic steroids -MAOIs like Carbex, Eldepryl, Marplan, Nardil, and Parnate -medicines for weight loss -medicines for allergies, asthma, cold, or cough -medicines for depression, anxiety, or psychotic disturbances -niacin -nicotine -NSAIDs, medicines for pain and inflammation, like ibuprofen or naproxen -octreotide -pasireotide -pentamidine -phenytoin -probenecid -quinolone antibiotics such as ciprofloxacin, levofloxacin, ofloxacin -some herbal dietary supplements -steroid medicines such as prednisone or cortisone -sulfamethoxazole; trimethoprim -thyroid hormones Some medications can hide the warning symptoms of low blood sugar (hypoglycemia). You may need to monitor your blood sugar more closely if you are taking one of these medications. These include: -beta-blockers, often used for high blood pressure or heart problems (examples include atenolol, metoprolol, propranolol) -clonidine -guanethidine -reserpine This list may not describe all possible interactions. Give your health care provider a list of all the medicines, herbs, non-prescription drugs, or dietary supplements you use. Also tell them if you smoke, drink  alcohol, or use illegal drugs. Some items may  interact with your medicine. What should I watch for while using this medicine? Visit your health care professional or doctor for regular checks on your progress. Do not drive, use machinery, or do anything that needs mental alertness until you know how this medicine affects you. Alcohol may interfere with the effect of this medicine. Avoid alcoholic drinks. A test called the HbA1C (A1C) will be monitored. This is a simple blood test. It measures your blood sugar control over the last 2 to 3 months. You will receive this test every 3 to 6 months. Learn how to check your blood sugar. Learn the symptoms of low and high blood sugar and how to manage them. Always carry a quick-source of sugar with you in case you have symptoms of low blood sugar. Examples include hard sugar candy or glucose tablets. Make sure others know that you can choke if you eat or drink when you develop serious symptoms of low blood sugar, such as seizures or unconsciousness. They must get medical help at once. Tell your doctor or health care professional if you have high blood sugar. You might need to change the dose of your medicine. If you are sick or exercising more than usual, you might need to change the dose of your medicine. Do not skip meals. Ask your doctor or health care professional if you should avoid alcohol. Many nonprescription cough and cold products contain sugar or alcohol. These can affect blood sugar. Make sure that you have the right kind of syringe for the type of insulin you use. Try not to change the brand and type of insulin or syringe unless your health care professional or doctor tells you to. Switching insulin brand or type can cause dangerously high or low blood sugar. Always keep an extra supply of insulin, syringes, and needles on hand. Use a syringe one time only. Throw away syringe and needle in a closed container to prevent accidental needle sticks. Insulin pens and cartridges should never be shared.  Even if the needle is changed, sharing may result in passing of viruses like hepatitis or HIV. Wear a medical ID bracelet or chain, and carry a card that describes your disease and details of your medicine and dosage times. What side effects may I notice from receiving this medicine? Side effects that you should report to your doctor or health care professional as soon as possible: -allergic reactions like skin rash, itching or hives, swelling of the face, lips, or tongue -breathing problems -signs and symptoms of high blood sugar such as dizziness, dry mouth, dry skin, fruity breath, nausea, stomach pain, increased hunger or thirst, increased urination -signs and symptoms of low blood sugar such as feeling anxious, confusion, dizziness, increased hunger, unusually weak or tired, sweating, shakiness, cold, irritable, headache, blurred vision, fast heartbeat, loss of consciousness Side effects that usually do not require medical attention (report to your doctor or health care professional if they continue or are bothersome): -increase or decrease in fatty tissue under the skin due to overuse of a particular injection site -itching, burning, swelling, or rash at site where injected This list may not describe all possible side effects. Call your doctor for medical advice about side effects. You may report side effects to FDA at 1-800-FDA-1088. Where should I keep my medicine? Keep out of the reach of children. Store unopened vials in a refrigerator between 2 and 8 degrees C (36 and 46 degrees F). Do not freeze  or use if the insulin has been frozen. Opened vials (vials currently in use) may be stored in the refrigerator or at room temperature, at approximately 25 degrees C (77 degrees F) or cooler. Keeping your insulin at room temperature decreases the amount of pain during injection. Once opened, your insulin can be used for 28 days. After 28 days, the vial should be thrown away. Store Lantus Water quality scientist in a refrigerator between 2 and 8 degrees C (36 and 46 degrees F) or at room temperature below 30 degrees C (86 degrees F). Do not freeze or use if the insulin has been frozen. Once opened, the pens should be kept at room temperature. Do not store in the refrigerator once opened. Once opened, the insulin can be used for 28 days. After 28 days, the Lantus Solostar Pen or Basaglar KwikPen should be thrown away. Store [employer] in a refrigerator between 2 and 8 degrees C (36 and 46 degrees F). Do not freeze or use if the insulin has been frozen. Once opened, the pens should be kept at room temperature below 30 degrees C (86 degrees F). Do not store in the refrigerator once opened. Once opened, the insulin can be used for 42 days. After 42 days, the [employer] Pen should be thrown away. Protect from light and excessive heat. Throw away any unused medicine after the expiration date or after the specified time for room temperature storage has passed. NOTE: This sheet is a summary. It may not cover all possible information. If you have questions about this medicine, talk to your doctor, pharmacist, or health care provider.  2018 Elsevier/Gold Standard (2016-08-12 10:26:25)

## 2018-03-31 ENCOUNTER — Encounter (INDEPENDENT_AMBULATORY_CARE_PROVIDER_SITE_OTHER): Payer: Self-pay | Admitting: Ophthalmology

## 2018-03-31 ENCOUNTER — Ambulatory Visit (INDEPENDENT_AMBULATORY_CARE_PROVIDER_SITE_OTHER): Payer: BLUE CROSS/BLUE SHIELD | Admitting: Ophthalmology

## 2018-03-31 DIAGNOSIS — H3342 Traction detachment of retina, left eye: Secondary | ICD-10-CM

## 2018-03-31 DIAGNOSIS — H35033 Hypertensive retinopathy, bilateral: Secondary | ICD-10-CM

## 2018-03-31 DIAGNOSIS — H3581 Retinal edema: Secondary | ICD-10-CM | POA: Diagnosis not present

## 2018-03-31 DIAGNOSIS — I10 Essential (primary) hypertension: Secondary | ICD-10-CM

## 2018-03-31 DIAGNOSIS — E113513 Type 2 diabetes mellitus with proliferative diabetic retinopathy with macular edema, bilateral: Secondary | ICD-10-CM | POA: Diagnosis not present

## 2018-03-31 DIAGNOSIS — H4312 Vitreous hemorrhage, left eye: Secondary | ICD-10-CM

## 2018-03-31 DIAGNOSIS — H25813 Combined forms of age-related cataract, bilateral: Secondary | ICD-10-CM

## 2018-03-31 MED ORDER — BEVACIZUMAB CHEMO INJECTION 1.25MG/0.05ML SYRINGE FOR KALEIDOSCOPE
1.2500 mg | INTRAVITREAL | Status: DC
  Administered 2018-03-31: 1.25 mg via INTRAVITREAL

## 2018-03-31 MED ORDER — AFLIBERCEPT 2MG/0.05ML IZ SOLN FOR KALEIDOSCOPE
2.0000 mg | INTRAVITREAL | Status: DC
  Administered 2018-03-31: 2 mg via INTRAVITREAL

## 2018-04-12 ENCOUNTER — Ambulatory Visit: Payer: BLUE CROSS/BLUE SHIELD | Admitting: Pharmacist

## 2018-04-12 DIAGNOSIS — I480 Paroxysmal atrial fibrillation: Secondary | ICD-10-CM

## 2018-04-12 DIAGNOSIS — Z5181 Encounter for therapeutic drug level monitoring: Secondary | ICD-10-CM | POA: Diagnosis not present

## 2018-04-12 LAB — POCT INR: INR: 1.7 — AB (ref 2.0–3.0)

## 2018-04-13 ENCOUNTER — Ambulatory Visit: Payer: BLUE CROSS/BLUE SHIELD | Admitting: Family Medicine

## 2018-04-18 ENCOUNTER — Encounter: Payer: Self-pay | Admitting: Family Medicine

## 2018-04-18 ENCOUNTER — Ambulatory Visit (INDEPENDENT_AMBULATORY_CARE_PROVIDER_SITE_OTHER): Payer: BLUE CROSS/BLUE SHIELD | Admitting: Family Medicine

## 2018-04-18 VITALS — BP 150/66 | HR 60 | Temp 97.5°F | Ht 66.0 in | Wt 190.0 lb

## 2018-04-18 DIAGNOSIS — H547 Unspecified visual loss: Secondary | ICD-10-CM | POA: Diagnosis not present

## 2018-04-18 DIAGNOSIS — R609 Edema, unspecified: Secondary | ICD-10-CM | POA: Diagnosis not present

## 2018-04-18 DIAGNOSIS — I10 Essential (primary) hypertension: Secondary | ICD-10-CM | POA: Diagnosis not present

## 2018-04-18 DIAGNOSIS — R8290 Unspecified abnormal findings in urine: Secondary | ICD-10-CM

## 2018-04-18 DIAGNOSIS — R42 Dizziness and giddiness: Secondary | ICD-10-CM

## 2018-04-18 DIAGNOSIS — R600 Localized edema: Secondary | ICD-10-CM

## 2018-04-18 DIAGNOSIS — Z09 Encounter for follow-up examination after completed treatment for conditions other than malignant neoplasm: Secondary | ICD-10-CM

## 2018-04-18 LAB — POCT URINALYSIS DIP (MANUAL ENTRY)
Bilirubin, UA: NEGATIVE
Glucose, UA: 250 mg/dL — AB
Ketones, POC UA: NEGATIVE mg/dL
Leukocytes, UA: NEGATIVE
Nitrite, UA: NEGATIVE
Protein Ur, POC: >=300 mg/dL — AB
Spec Grav, UA: 1.025 (ref 1.010–1.025)
Urobilinogen, UA: 0.2 E.U./dL
pH, UA: 5.5 (ref 5.0–8.0)

## 2018-04-18 MED ORDER — FUROSEMIDE 40 MG PO TABS: 40.0000 mg | ORAL_TABLET | Freq: Every day | ORAL | 0 refills | Status: DC

## 2018-04-18 MED ORDER — HYDROCHLOROTHIAZIDE 25 MG PO TABS: 25.0000 mg | ORAL_TABLET | Freq: Every day | ORAL | 1 refills | Status: DC

## 2018-04-18 MED FILL — FUROSEMIDE 40 MG TAB: 40 | 90 days supply | Qty: 90 | Fill #0

## 2018-04-18 MED FILL — HYDROCHLOROTHIAZIDE 25 MG T: 25 | 30 days supply | Qty: 30 | Fill #0

## 2018-04-18 NOTE — Progress Notes (Addendum)
Follow Up  Subjective:    Patient ID: Amber Young, female    DOB: 05/15/1956, 62 y.o.   MRN: [id number]   Chief Complaint  Patient presents with  . Follow-up    blood pressure    HPI  Amber Young is a 62 year old female with a past medical history of Diabetes, Stroke, Hypertension, Hyperlipidemia, Heart Murmur, GERD, CAD, CKD, CHF, and Asthma. She is here today for follow up.   Current Status: Since her last office visit, she is doing well. She has an upcoming appointment with Optometry for follow up on Cataracts. Denies severe headaches, confusion, seizures, double vision, and blurred vision, nausea and vomiting. She denies chest pain, heart palpitations, and falls. She has occasionally headaches and dizziness with position changes.   She denies fevers, chills, fatigue, recent infections, weight loss, and night sweats. She has not had any headaches, and falls. No chest pain, heart palpitations, cough and shortness of breath reported. No reports of GI problems such as nausea, vomiting, diarrhea, and constipation. She has no reports of blood in stools, dysuria and hematuria. No depression or anxiety reported. She denies pain today.   Past Medical History:  Diagnosis Date  . Anemia   . CHF (congestive heart failure) ([place])   . CKD (chronic kidney disease), stage III ([place])   . Coronary artery disease   . GERD (gastroesophageal reflux disease)   . Heart murmur   . Hyperlipidemia   . Hypertension   . 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 Stroke ([place]) 09/2014   numbness left upper lip, finger tips on left hand; "resolved" (05/18/2016)  . Type II diabetes mellitus ([place])   . Wears glasses     Family History  Problem Relation Age of Onset  . Diabetes Mother   . Hypertension Mother   . COPD Mother   . Heart failure Mother   . Hypertension Sister   . Hypertension Brother     Social History    Socioeconomic History  . Marital status: Married    Spouse name: Not on file  . Number of children: Not on file  . Years of education: 68  . Highest education level: Not on file  Occupational History  . Occupation: hairdresser  Social Needs  . Financial resource strain: Not on file  . Food insecurity:    Worry: Not on file    Inability: Not on file  . Transportation needs:    Medical: Not on file    Non-medical: Not on file  Tobacco Use  . Smoking status: Never Smoker  . Smokeless tobacco: Never Used  Substance and Sexual Activity  . Alcohol use: No    Alcohol/week: 0.0 standard drinks  . Drug use: No  . Sexual activity: Yes  Lifestyle  . Physical activity:    Days per week: Not on file    Minutes per session: Not on file  . Stress: Not on file  Relationships  . Social connections:    Talks on phone: Not on file    Gets together: Not on file    Attends religious service: Not on file    Active member of club or organization: Not on file    Attends meetings of clubs or organizations: Not on file    Relationship status: Not on file  . Intimate partner violence:    Fear of current or ex partner: Not on file    Emotionally  abused: Not on file    Physically abused: Not on file    Forced sexual activity: Not on file  Other Topics Concern  . Not on file  Social History Narrative   Married, 2 children, hairdresser   Caffeine use- occasional tea or coffee   Right handed    Past Surgical History:  Procedure Laterality Date  . CARDIAC CATHETERIZATION N/A 05/18/2016   Procedure: Left Heart Cath and Coronary Angiography;  Surgeon: Jettie Booze, MD;  Location: [facility];  Service: Cardiovascular;  Laterality: N/A;  . CARDIAC CATHETERIZATION N/A 05/18/2016   Procedure: Coronary Stent Intervention;  Surgeon: Jettie Booze, MD;  Location: [facility];  Service: Cardiovascular;  Laterality: N/A;  . COLONOSCOPY W/ BIOPSIES AND POLYPECTOMY    .  CORONARY ANGIOPLASTY    . CORONARY ANGIOPLASTY WITH STENT PLACEMENT    . CORONARY STENT INTERVENTION N/A 09/07/2017   Procedure: CORONARY STENT INTERVENTION;  Surgeon: Leonie Man, MD;  Location: [facility];  Service: Cardiovascular;  Laterality: N/A;  . EYE SURGERY    . GAS INSERTION Left 01/13/2018   Procedure: INSERTION OF GAS;  Surgeon: Bernarda Caffey, MD;  Location: [facility];  Service: Ophthalmology;  Laterality: Left;  . LEFT HEART CATH AND CORONARY ANGIOGRAPHY N/A 09/07/2017   Procedure: LEFT HEART CATH AND CORONARY ANGIOGRAPHY;  Surgeon: Leonie Man, MD;  Location: [facility];  Service: Cardiovascular;  Laterality: N/A;  . MEMBRANE PEEL Left 01/13/2018   Procedure: MEMBRANE PEEL LEFT EYE ;  Surgeon: Bernarda Caffey, MD;  Location: [facility];  Service: Ophthalmology;  Laterality: Left;  Marland Kitchen MULTIPLE TOOTH EXTRACTIONS    . PARS PLANA VITRECTOMY Left 01/13/2018   Procedure: PARS PLANA VITRECTOMY WITH 25 GAUGE LEFT EYE WITH ENDOLASER;  Surgeon: Bernarda Caffey, MD;  Location: [facility];  Service: Ophthalmology;  Laterality: Left;  . TUBAL LIGATION  1980  . VAGINAL HYSTERECTOMY  1999   "partial; fibroids"    Immunization History  Administered Date(s) Administered  . Tdap 01/05/2017    Current Meds  Medication Sig  . amLODipine (NORVASC) 10 MG tablet Take 1 tablet (10 mg total) by mouth daily.  Marland Kitchen atorvastatin (LIPITOR) 40 MG tablet Take 1 tablet (40 mg total) by mouth daily at 6 PM.  . carvedilol (COREG) 25 MG tablet Take 2 tablets (50 mg total) by mouth 2 (two) times daily.  . ferrous sulfate (FERROUSUL) 325 (65 FE) MG tablet Take 1 tablet (325 mg total) by mouth every other day.  . furosemide (LASIX) 40 MG tablet Take 1 tablet (40 mg total) by mouth daily.  . Glucose Blood (BLOOD GLUCOSE TEST STRIPS) STRP Use as directed  . hydrALAZINE (APRESOLINE) 50 MG tablet Take 1.5 tablets (75 mg total) by mouth 3 (three) times daily. (Patient taking differently: Take 50 mg by mouth 3 (three)  times daily. )  . insulin glargine (LANTUS) 100 UNIT/ML injection Inject 0.2 mLs (20 Units total) into the skin 2 (two) times daily.  . insulin lispro (HUMALOG) 100 UNIT/ML injection Inject 0.05 mLs (5 Units total) into the skin 3 (three) times daily with meals.  . Insulin Pen Needle 29G X 10MM MISC Use as directed  . INSULIN SYRINGE .5CC/29G 29G X 1/2" 0.5 ML MISC Use to administer insulin three times daily  . losartan (COZAAR) 100 MG tablet Take 1 tablet (100 mg total) by mouth daily.  . potassium chloride SA (K-DUR,KLOR-CON) 10 MEQ tablet Take 1 tablet with each dose  of furosemide, daily.  . prednisoLONE acetate (PRED FORTE) 1 % ophthalmic suspension Place 1 drop into the left eye 4 (four) times daily.  . ticagrelor (BRILINTA) 90 MG TABS tablet Take 1 tablet (90 mg total) by mouth 2 (two) times daily.  Marland Kitchen warfarin (COUMADIN) 5 MG tablet Take 1 to 1.5 tablets by mouth daily as directed by coumadin clinic  . [DISCONTINUED] furosemide (LASIX) 40 MG tablet Take 1 tablet (40 mg total) by mouth daily.   Current Facility-Administered Medications for the 04/18/18 encounter (Office Visit) with Azzie Glatter, FNP  Medication  . aflibercept (EYLEA) SOLN 2 mg  . Bevacizumab (AVASTIN) SOLN 1.25 mg  . Bevacizumab (AVASTIN) SOLN 1.25 mg    Allergies  Allergen Reactions  . Ace Inhibitors Cough    BP (!) 150/66   Pulse 60   Temp (!) 97.5 F (36.4 C) (Oral)   Ht 5\' 6"  (1.676 m)   Wt 190 lb (86.2 kg)   SpO2 96%   BMI 30.67 kg/m   Review of Systems  Constitutional: Positive for fatigue (increased).  Eyes: Positive for visual disturbance (cataract, left eye).  Respiratory: Negative.   Cardiovascular: Negative.   Musculoskeletal: Negative.   Skin: Negative.   Neurological: Positive for dizziness (occasional).  Psychiatric/Behavioral: Negative.    Objective:   Physical Exam  Constitutional: She is oriented to person, place, and time. She appears well-developed and well-nourished.   Cardiovascular: Normal rate, regular rhythm, normal heart sounds and intact distal pulses.  Pulmonary/Chest: Effort normal and breath sounds normal.  Abdominal: Soft.  Musculoskeletal: Normal range of motion.  Neurological: She is alert and oriented to person, place, and time.  Skin: Skin is warm. Capillary refill takes less than 2 seconds.  Psychiatric: She has a normal mood and affect. Her behavior is normal. Judgment and thought content normal.  Nursing note and vitals reviewed.  Assessment & Plan:   1. Essential hypertension Blood pressure is elevated today at 150/66. Patient was previously taking Clonidine, but she had increase side effects of dizziness, so it was discontinued. She will continue Amlodipine, Carvedilol, Furosemide, Hydralazine, and Losartan as prescribed. We will initiate a 30 day trial of HCTZ today.  - furosemide (LASIX) 40 MG tablet; Take 1 tablet  (40 mg total) by mouth daily.  Dispense: 90 tablet; Refill: 0  2. Visual problems Keep follow up appointment with Optometry.  - POCT urinalysis dipstick - hydrochlorothiazide (HYDRODIURIL) 25 MG tablet; Take 1 tablet (25 mg total) by mouth daily.  Dispense: 30 tablet; Refill: 1 - furosemide (LASIX) 40 MG tablet; Take 1 tablet (40 mg total) by mouth daily.  Dispense: 90 tablet; Refill: 0  3. Edema, peripheral 1+ edema. She will continue Lasix as prescribe. New Rx for HCTZ will aide in peripheral edema.  - hydrochlorothiazide (HYDRODIURIL) 25 MG tablet; Take 1 tablet (25 mg total) by mouth daily.  Dispense: 30 tablet; Refill: 1 - furosemide (LASIX) 40 MG tablet; Take 1 tablet (40 mg total) by mouth daily.  Dispense: 90 tablet; Refill: 0  4. Abnormal urinalysis Results of Urine Culture is pending.  - Urine Culture  5. Dizziness Minimal. She will change positions cautiously upon ambulating. She will report to office if dizziness worsens.   6. Follow up She will follow up in 1 month to assess effectiveness of HCTZ on  blood pressure.   Meds ordered this encounter  Medications  . hydrochlorothiazide (HYDRODIURIL) 25 MG tablet    Sig: Take 1 tablet (25 mg total) by mouth  daily.    Dispense:  30 tablet    Refill:  1  . furosemide (LASIX) 40 MG tablet    Sig: Take 1 tablet (40 mg total) by mouth daily.    Dispense:  90 tablet    Refill:  0    Kathe Becton,  MSN, FNP-C [facility] [address] [place], [place] [address] [phone]

## 2018-04-18 NOTE — Patient Instructions (Signed)
Hydrochlorothiazide, HCTZ capsules or tablets What is this medicine? HYDROCHLOROTHIAZIDE (hye droe klor oh THYE a zide) is a diuretic. It increases the amount of urine passed, which causes the body to lose salt and water. This medicine is used to treat high blood pressure. It is also reduces the swelling and water retention caused by various medical conditions, such as heart, liver, or kidney disease. This medicine may be used for other purposes; ask your health care provider or pharmacist if you have questions. COMMON BRAND NAME(S): Esidrix, Ezide, HydroDIURIL, Microzide, Oretic, Zide What should I tell my health care provider before I take this medicine? They need to know if you have any of these conditions: -diabetes -gout -immune system problems, like lupus -kidney disease or kidney stones -liver disease -pancreatitis -small amount of urine or difficulty passing urine -an unusual or allergic reaction to hydrochlorothiazide, sulfa drugs, other medicines, foods, dyes, or preservatives -pregnant or trying to get pregnant -breast-feeding How should I use this medicine? Take this medicine by mouth with a glass of water. Follow the directions on the prescription label. Take your medicine at regular intervals. Remember that you will need to pass urine frequently after taking this medicine. Do not take your doses at a time of day that will cause you problems. Do not stop taking your medicine unless your doctor tells you to. Talk to your pediatrician regarding the use of this medicine in children. Special care may be needed. Overdosage: If you think you have taken too much of this medicine contact a poison control center or emergency room at once. NOTE: This medicine is only for you. Do not share this medicine with others. What if I miss a dose? If you miss a dose, take it as soon as you can. If it is almost time for your next dose, take only that dose. Do not take double or extra doses. What may  interact with this medicine? -cholestyramine -colestipol -digoxin -dofetilide -lithium -medicines for blood pressure -medicines for diabetes -medicines that relax muscles for surgery -other diuretics -steroid medicines like prednisone or cortisone This list may not describe all possible interactions. Give your health care provider a list of all the medicines, herbs, non-prescription drugs, or dietary supplements you use. Also tell them if you smoke, drink alcohol, or use illegal drugs. Some items may interact with your medicine. What should I watch for while using this medicine? Visit your doctor or health care professional for regular checks on your progress. Check your blood pressure as directed. Ask your doctor or health care professional what your blood pressure should be and when you should contact him or her. You may need to be on a special diet while taking this medicine. Ask your doctor. Check with your doctor or health care professional if you get an attack of severe diarrhea, nausea and vomiting, or if you sweat a lot. The loss of too much body fluid can make it dangerous for you to take this medicine. You may get drowsy or dizzy. Do not drive, use machinery, or do anything that needs mental alertness until you know how this medicine affects you. Do not stand or sit up quickly, especially if you are an older patient. This reduces the risk of dizzy or fainting spells. Alcohol may interfere with the effect of this medicine. Avoid alcoholic drinks. This medicine may affect your blood sugar level. If you have diabetes, check with your doctor or health care professional before changing the dose of your diabetic medicine. This medicine  can make you more sensitive to the sun. Keep out of the sun. If you cannot avoid being in the sun, wear protective clothing and use sunscreen. Do not use sun lamps or tanning beds/booths. What side effects may I notice from receiving this medicine? Side effects  that you should report to your doctor or health care professional as soon as possible: -allergic reactions such as skin rash or itching, hives, swelling of the lips, mouth, tongue, or throat -changes in vision -chest pain -eye pain -fast or irregular heartbeat -feeling faint or lightheaded, falls -gout attack -muscle pain or cramps -pain or difficulty when passing urine -pain, tingling, numbness in the hands or feet -redness, blistering, peeling or loosening of the skin, including inside the mouth -unusually weak or tired Side effects that usually do not require medical attention (report to your doctor or health care professional if they continue or are bothersome): -change in sex drive or performance -dry mouth -headache -stomach upset This list may not describe all possible side effects. Call your doctor for medical advice about side effects. You may report side effects to FDA at 1-800-FDA-1088. Where should I keep my medicine? Keep out of the reach of children. Store at room temperature between 15 and 30 degrees C (59 and 86 degrees F). Do not freeze. Protect from light and moisture. Keep container closed tightly. Throw away any unused medicine after the expiration date. NOTE: This sheet is a summary. It may not cover all possible information. If you have questions about this medicine, talk to your doctor, pharmacist, or health care provider.  2018 [employer]vier/Gold Standard (2010-03-21 12:57:37)

## 2018-04-20 ENCOUNTER — Ambulatory Visit: Payer: BLUE CROSS/BLUE SHIELD | Admitting: Cardiovascular Disease

## 2018-04-20 LAB — URINE CULTURE

## 2018-04-27 NOTE — Progress Notes (Addendum)
[facility] Note  04/28/2018     CHIEF COMPLAINT Patient presents for Retina Follow Up s/p 25g PPV/MP/EL/Gas OS for TRD and VH, 01/13/18  HISTORY OF PRESENT ILLNESS: Amber Young is a 62 y.o. female who presents to the clinic today for:   HPI    Retina Follow Up    Patient presents with  Diabetic Retinopathy.  In both eyes.  Severity is moderate.  Duration of 4 weeks.  Since onset it is stable.  I, the attending physician,  performed the HPI with the patient and updated documentation appropriately.          Comments    Pt presents for PDR OU f/u, pt states [facility] has been "pretty good" states she still feels like OS has "something on the side" that she thinks has to do with her cataract, pt denies flashes, floaters, pain and wavy vision, pt is using pred forte and another gtts with a blue top, pt has started a new BP pill, but cannot remember the name       Last edited by Bernarda Caffey, MD on 04/28/2018 10:48 AM. (History)    Pt reports gas bubble has disappeared x 1 week ago;   Referring physician: Azzie Glatter, FNP [place], [place] [id number]  HISTORICAL INFORMATION:   Selected notes from the MEDICAL RECORD NUMBER Referred by Molli Barrows, FNP for DM exam;  LEE-  Ocular Hx-  PMH- DM (A1C - 9.4 (02.13.19)), CKD, HTN, CAD, CHF, hx of stroke    CURRENT MEDICATIONS: Current Outpatient Medications (Ophthalmic Drugs)  Medication Sig  . prednisoLONE acetate (PRED FORTE) 1 % ophthalmic suspension Place 1 drop into the left eye 4 (four) times daily.   Current Facility-Administered Medications (Ophthalmic Drugs)  Medication Route  . aflibercept (EYLEA) SOLN 2 mg Intravitreal  . aflibercept (EYLEA) SOLN 2 mg Intravitreal   Current Outpatient Medications (Other)  Medication Sig  . amLODipine (NORVASC) 10 MG tablet Take 1 tablet (10 mg total) by mouth daily.  Marland Kitchen atorvastatin (LIPITOR) 40 MG tablet Take 1 tablet (40 mg total) by  mouth daily at 6 PM.  . carvedilol (COREG) 25 MG tablet Take 2 tablets (50 mg total) by mouth 2 (two) times daily.  . ferrous sulfate (FERROUSUL) 325 (65 FE) MG tablet Take 1 tablet (325 mg total) by mouth every other day.  . furosemide (LASIX) 40 MG tablet Take 1 tablet (40 mg total) by mouth daily.  . Glucose Blood (BLOOD GLUCOSE TEST STRIPS) STRP Use as directed  . hydrALAZINE (APRESOLINE) 50 MG tablet Take 1.5 tablets (75 mg total) by mouth 3 (three) times daily. (Patient taking differently: Take 50 mg by mouth 3 (three) times daily. )  . hydrochlorothiazide (HYDRODIURIL) 25 MG tablet Take 1 tablet (25 mg total) by mouth daily.  . insulin glargine (LANTUS) 100 UNIT/ML injection Inject 0.2 mLs (20 Units total) into the skin 2 (two) times daily.  . insulin lispro (HUMALOG) 100 UNIT/ML injection Inject 0.05 mLs (5 Units total) into the skin 3 (three) times daily with meals.  . Insulin Pen Needle 29G X 10MM MISC Use as directed  . INSULIN SYRINGE .5CC/29G 29G X 1/2" 0.5 ML MISC Use to administer insulin three times daily  . losartan (COZAAR) 100 MG tablet Take 1 tablet (100 mg total) by mouth daily.  . nitroGLYCERIN (NITROSTAT) 0.4 MG SL tablet Place 1 tablet (0.4 mg total) under the tongue every 5 (five) minutes x 3  doses as needed for chest pain. (Patient not taking: Reported on 04/18/2018)  . potassium chloride SA (K-DUR,KLOR-CON) 10 MEQ tablet Take 1 tablet with each dose of furosemide, daily.  . ticagrelor (BRILINTA) 90 MG TABS tablet Take 1 tablet (90 mg total) by mouth 2 (two) times daily.  Marland Kitchen warfarin (COUMADIN) 5 MG tablet Take 1 to 1.5 tablets by mouth daily as directed by coumadin clinic   Current Facility-Administered Medications (Other)  Medication Route  . Bevacizumab (AVASTIN) SOLN 1.25 mg Intravitreal  . Bevacizumab (AVASTIN) SOLN 1.25 mg Intravitreal  . Bevacizumab (AVASTIN) SOLN 1.25 mg Intravitreal      REVIEW OF SYSTEMS: ROS    Positive for: Endocrine, Cardiovascular,  Eyes, Heme/Lymph   Negative for: Constitutional, Gastrointestinal, Neurological, Skin, Genitourinary, Musculoskeletal, HENT, Respiratory, Psychiatric, Allergic/Imm   Last edited by Debbrah Alar, COT on 04/28/2018  9:27 AM. (History)       ALLERGIES Allergies  Allergen Reactions  . Ace Inhibitors Cough    PAST MEDICAL HISTORY Past Medical History:  Diagnosis Date  . Anemia   . CHF (congestive heart failure) ([place])   . CKD (chronic kidney disease), stage III ([place])   . Coronary artery disease   . GERD (gastroesophageal reflux disease)   . Heart murmur   . Hyperlipidemia   . Hypertension   . Proliferative diabetic retinopathy ([place])    left eye with vitreous hemorrhage and tractional retinal detachment  . Stroke ([place]) 09/2014   numbness left upper lip, finger tips on left hand; "resolved" (05/18/2016)  . Type II diabetes mellitus ([place])   . Wears glasses    Past Surgical History:  Procedure Laterality Date  . CARDIAC CATHETERIZATION N/A 05/18/2016   Procedure: Left Heart Cath and Coronary Angiography;  Surgeon: Jettie Booze, MD;  Location: [facility];  Service: Cardiovascular;  Laterality: N/A;  . CARDIAC CATHETERIZATION N/A 05/18/2016   Procedure: Coronary Stent Intervention;  Surgeon: Jettie Booze, MD;  Location: [facility];  Service: Cardiovascular;  Laterality: N/A;  . COLONOSCOPY W/ BIOPSIES AND POLYPECTOMY    . CORONARY ANGIOPLASTY    . CORONARY ANGIOPLASTY WITH STENT PLACEMENT    . CORONARY STENT INTERVENTION N/A 09/07/2017   Procedure: CORONARY STENT INTERVENTION;  Surgeon: Leonie Man, MD;  Location: [facility];  Service: Cardiovascular;  Laterality: N/A;  . EYE SURGERY    . GAS INSERTION Left 01/13/2018   Procedure: INSERTION OF GAS;  Surgeon: Bernarda Caffey, MD;  Location: [facility];  Service: Ophthalmology;  Laterality: Left;  . LEFT HEART CATH AND CORONARY ANGIOGRAPHY N/A 09/07/2017   Procedure: LEFT HEART CATH AND CORONARY  ANGIOGRAPHY;  Surgeon: Leonie Man, MD;  Location: [facility];  Service: Cardiovascular;  Laterality: N/A;  . MEMBRANE PEEL Left 01/13/2018   Procedure: MEMBRANE PEEL LEFT EYE ;  Surgeon: Bernarda Caffey, MD;  Location: [facility];  Service: Ophthalmology;  Laterality: Left;  Marland Kitchen MULTIPLE TOOTH EXTRACTIONS    . PARS PLANA VITRECTOMY Left 01/13/2018   Procedure: PARS PLANA VITRECTOMY WITH 25 GAUGE LEFT EYE WITH ENDOLASER;  Surgeon: Bernarda Caffey, MD;  Location: [facility];  Service: Ophthalmology;  Laterality: Left;  . TUBAL LIGATION  1980  . VAGINAL HYSTERECTOMY  1999   "partial; fibroids"    FAMILY HISTORY Family History  Problem Relation Age of Onset  . Diabetes Mother   . Hypertension Mother   . COPD Mother   . Heart failure Mother   . Hypertension Sister   .  Hypertension Brother     SOCIAL HISTORY Social History   Tobacco Use  . Smoking status: Never Smoker  . Smokeless tobacco: Never Used  Substance Use Topics  . Alcohol use: No    Alcohol/week: 0.0 standard drinks  . Drug use: No         OPHTHALMIC EXAM:  Base Eye Exam    Visual Acuity (Snellen - Linear)      Right Left   Dist cc 20/100 -1 20/100 -1   Dist ph cc 20/70 +2 20/40 -2   Correction:  Glasses       Tonometry (Tonopen, 9:33 AM)      Right Left   Pressure 17 17       Pupils      Dark Light Shape React APD   Right 4 3 Round Slow None   Left 5 4.5 Round Minimal None       Visual Fields (Counting fingers)      Left Right    Full Full       Extraocular Movement      Right Left    Full, Ortho Full, Ortho       Neuro/Psych    Oriented x3:  Yes   Mood/Affect:  Normal       Dilation    Both eyes:  1.0% Mydriacyl, 2.5% Phenylephrine @ 9:33 AM        Slit Lamp and Fundus Exam    Slit Lamp Exam      Right Left   Lids/Lashes Dermatochalasis - upper lid, mild Meibomian gland dysfunction Dermatochalasis - upper lid, Meibomian gland dysfunction   Conjunctiva/Sclera White and quiet White and  quiet   Cornea Arcus, 1+ Punctate epithelial erosions, 1+ pigmented [place]ata, irregular tear film Arcus, 1+ Punctate epithelial erosions, EBMD, pigmented Guttata   Anterior Chamber Deep and quiet Deep, 0.5+ pigment   Iris Round and dilated, No NVI Round and dilated, No NVI, persistent pupillary membrane   Lens 2-3+ Nuclear sclerosis, 2+ Cortical cataract, Vacuoles, iridescent refractile opacities 3+ Nuclear sclerosis, 2+ Cortical cataract, Vacuoles, iridescent refractile opacities, 1+ Posterior subcapsular cataract   Vitreous Vitreous syneresis, mild Asteroid hyalosis superiorly Post vitrectomy, +pigment in anterior vitreous       Fundus Exam      Right Left   Disc compact, Pink and Sharp pink and Sharp   C/D Ratio 0.5 0.5   Macula Blunted foveal reflex, +edema - mild interval improvement, +DBH, +exudate greatest superiorly, Microaneurysms,   Blunted foveal reflex, Microaneurysms, Exudates   Vessels Tortuous; +fine NV regressing Attenuated; sclerosis temporally   Periphery Attached, 360 DBH Flat under gas, 360 PRP in place; IRH superiorly, scattered dot hemorrhages          IMAGING AND PROCEDURES  Imaging and Procedures for 12/07/17  OCT, Retina - OU - Both Eyes       Right Eye Quality was good. Central Foveal Thickness: 498. Progression has improved. Findings include abnormal foveal contour, intraretinal fluid, vitreomacular adhesion , no SRF, outer retinal atrophy (Interval improvement in IRF).   Left Eye Quality was poor. Central Foveal Thickness: 315. Progression has improved. Findings include intraretinal fluid, no SRF (Interval improvement in IRF).   Notes *Images captured and stored on drive  Diagnosis / Impression:  Central DME OU OD: mild interval improvement in IRF OS: mild interval improvement in IRF  Clinical management:  See below  Abbreviations: NFP - Normal foveal profile. CME - cystoid macular edema. PED - pigment epithelial detachment.  IRF - intraretinal  fluid. SRF - subretinal fluid. EZ - ellipsoid zone. ERM - epiretinal membrane. ORA - outer retinal atrophy. ORT - outer retinal tubulation. SRHM - subretinal hyper-reflective material         Intravitreal Injection, Pharmacologic Agent - OD - Right Eye       Time Out 04/28/2018. 10:51 AM. Confirmed correct patient, procedure, site, and patient consented.   Anesthesia Topical anesthesia was used. Anesthetic medications included Lidocaine 2%, Tetracaine 0.5%.   Procedure Preparation included eyelid speculum, 5% betadine to ocular surface. A 30 gauge needle was used.   Injection:  2 mg aflibercept 2 MG/0.05ML   NDC: 61755-005-02, Lot: [id number], Expiration date: 03/09/2019   Route: Intravitreal, Site: Right Eye, Waste: 0.05 mL  Post-op Post injection exam found visual acuity of at least counting fingers. The patient tolerated the procedure well. There were no complications. The patient received written and verbal post procedure care education.        Intravitreal Injection, Pharmacologic Agent - OS - Left Eye       Time Out 04/28/2018. 11:09 AM. Confirmed correct patient, procedure, site, and patient consented.   Anesthesia Topical anesthesia was used. Anesthetic medications included Tetracaine 0.5%, Lidocaine 2%.   Procedure Preparation included 5% betadine to ocular surface, eyelid speculum. A 30 gauge needle was used.   Injection:  1.25 mg Bevacizumab 1.25mg /0.55ml   NDC: 50242-060-01, Lot: [id number] , Expiration date: 06/11/2018   Route: Intravitreal, Site: Left Eye, Waste: 0 mL  Post-op Post injection exam found visual acuity of at least counting fingers. The patient tolerated the procedure well. There were no complications. The patient received written and verbal post procedure care education.                 ASSESSMENT/PLAN:    ICD-10-CM   1. Proliferative diabetic retinopathy of both eyes with macular edema associated with type 2 diabetes mellitus  (HCC) E11.3513 OCT, Retina - OU - Both Eyes    Intravitreal Injection, Pharmacologic Agent - OD - Right Eye    Intravitreal Injection, Pharmacologic Agent - OS - Left Eye    aflibercept (EYLEA) SOLN 2 mg    Bevacizumab (AVASTIN) SOLN 1.25 mg  2. Retinal edema H35.81 OCT, Retina - OU - Both Eyes  3. Vitreous hemorrhage of left eye (HCC) H43.12   4. 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 Essential hypertension I10   6. Hypertensive retinopathy of both eyes H35.033   7. Combined forms of age-related cataract of both eyes H25.813     1,2. Proliferative diabetic retinopathy with diabetic macular edema, OU - exam shows significant IRH, macular edema, fine NVE OU; preretinal and vitreous heme OS -- improving OU - FA from prior shows NVE OU - OCT confirms diabetic macular edema, OU -- mild interval improvement today OU - s/p IVA #1 OS (03.13.19), #2 (04.15.19), #3 (05.13.19), #4 (06.03.19), #5 (08.22.19) - s/p IVA #1 OD (03.15.19), #2 (04.15.19), #3 (05.13.19), #4 (07.08.19)  - S/P IVE #1 OD (08.22.19) - sample  - s/p PRP OS (04.05.19), PRP fill-in OS (04.30.19); OD will need PRP eventually - s/p 25g PPV/MP/EL/Gas OS under general anesthesia for VH and TRD repair OS (06.06.19)             - doing well             - IOP okay today             - cont cosopt BID OS  - cont  PF QID OS  - can use PSO ung prn OS - OCT today shows OD with persistent DME--mildly improved from prior, OS interval improvement in DME - recommend IVE OD #2 today (09.19.19), IVA OS #5 today (09.19.19) - RBA of procedure discussed, questions answered - informed consent obtained and signed - see procedure note - Eylea paperwork and benefits investigation initiated on 08.22.19 -- approved for commercial copay assistance - F/U 4 wks  3. Vitreous hemorrhage OS - secondary to #1, above - s/p IVA OS as above - s/p PRP OS (04.05.19 and 04.30.19) - of note pt on warfarin  - s/p PPV w/ laser as above  4.  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 Hypertensive retinopathy OU - discussed importance of tight BP control - monitor  6. 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 - discussed diagnosis and progression - approaching visual significance - discussed likelihood of cataract progression OS following PPV - monitor for now   Ophthalmic Meds Ordered this visit:  Meds ordered this encounter  Medications  . aflibercept (EYLEA) SOLN 2 mg  . Bevacizumab (AVASTIN) SOLN 1.25 mg       Return in about 4 weeks (around 05/26/2018) for F/U PDR OU, DFE, OCT.  There are no Patient Instructions on file for this visit.   Explained the diagnoses, plan, and follow up with the patient and they expressed understanding.  Patient expressed understanding of the importance of proper follow up care.   This document serves as a record of services personally performed by Gardiner Sleeper, MD, PhD. It was created on their behalf by Ernest Mallick, OA, an ophthalmic assistant. The creation of this record is the provider's dictation and/or activities during the visit.    Electronically signed by: Ernest Mallick, OA  09.18.19 10:36 PM    Gardiner Sleeper, M.D., Ph.D. Diseases & Surgery of the Retina and Vitreous [facility]   I have reviewed the above documentation for accuracy and completeness, and I agree with the above. Gardiner Sleeper, M.D., Ph.D. 04/28/18 10:36 PM     Abbreviations: M myopia (nearsighted); A astigmatism; H hyperopia (farsighted); P presbyopia; Mrx spectacle prescription;  CTL contact lenses; OD right eye; OS left eye; OU both eyes  XT exotropia; ET esotropia; PEK punctate epithelial keratitis; PEE punctate epithelial erosions; DES dry eye syndrome; MGD meibomian gland dysfunction; ATs artificial  tears; PFAT's preservative free artificial tears; [place] nuclear sclerotic cataract; PSC posterior subcapsular cataract; ERM epi-retinal membrane; PVD posterior vitreous detachment; RD retinal detachment; DM diabetes mellitus; DR diabetic retinopathy; NPDR non-proliferative diabetic retinopathy; PDR proliferative diabetic retinopathy; CSME clinically significant macular edema; DME diabetic macular edema; dbh dot blot hemorrhages; CWS cotton wool spot; POAG primary open angle glaucoma; C/D cup-to-disc ratio; HVF humphrey visual field; GVF goldmann visual field; OCT optical coherence tomography; IOP intraocular pressure; BRVO Branch retinal vein occlusion; CRVO central retinal vein occlusion; CRAO central retinal artery occlusion; BRAO branch retinal artery occlusion; RT retinal tear; SB scleral buckle; PPV pars plana vitrectomy; VH Vitreous hemorrhage; PRP panretinal laser photocoagulation; IVK intravitreal kenalog; VMT vitreomacular traction; MH Macular hole;  NVD neovascularization of the disc; NVE neovascularization elsewhere; AREDS age related eye disease study; ARMD age related macular degeneration; POAG primary open angle glaucoma; EBMD epithelial/anterior basement membrane dystrophy; ACIOL anterior chamber intraocular lens; IOL intraocular  lens; PCIOL posterior chamber intraocular lens; Phaco/IOL phacoemulsification with intraocular lens placement; PRK photorefractive keratectomy; LASIK laser assisted in situ keratomileusis; HTN hypertension; DM diabetes mellitus; COPD chronic obstructive pulmonary disease 

## 2018-04-28 ENCOUNTER — Ambulatory Visit (INDEPENDENT_AMBULATORY_CARE_PROVIDER_SITE_OTHER): Payer: BLUE CROSS/BLUE SHIELD | Admitting: Ophthalmology

## 2018-04-28 ENCOUNTER — Encounter (INDEPENDENT_AMBULATORY_CARE_PROVIDER_SITE_OTHER): Payer: Self-pay | Admitting: Ophthalmology

## 2018-04-28 DIAGNOSIS — E113513 Type 2 diabetes mellitus with proliferative diabetic retinopathy with macular edema, bilateral: Secondary | ICD-10-CM

## 2018-04-28 DIAGNOSIS — H3581 Retinal edema: Secondary | ICD-10-CM

## 2018-04-28 DIAGNOSIS — H4312 Vitreous hemorrhage, left eye: Secondary | ICD-10-CM

## 2018-04-28 DIAGNOSIS — H25813 Combined forms of age-related cataract, bilateral: Secondary | ICD-10-CM

## 2018-04-28 DIAGNOSIS — H3342 Traction detachment of retina, left eye: Secondary | ICD-10-CM

## 2018-04-28 DIAGNOSIS — H35033 Hypertensive retinopathy, bilateral: Secondary | ICD-10-CM

## 2018-04-28 DIAGNOSIS — I10 Essential (primary) hypertension: Secondary | ICD-10-CM

## 2018-04-28 MED ORDER — BEVACIZUMAB CHEMO INJECTION 1.25MG/0.05ML SYRINGE FOR KALEIDOSCOPE
1.2500 mg | INTRAVITREAL | Status: DC
  Administered 2018-04-28: 1.25 mg via INTRAVITREAL

## 2018-04-28 MED ORDER — AFLIBERCEPT 2MG/0.05ML IZ SOLN FOR KALEIDOSCOPE
2.0000 mg | INTRAVITREAL | Status: DC
  Administered 2018-04-28: 2 mg via INTRAVITREAL

## 2018-05-03 ENCOUNTER — Ambulatory Visit (INDEPENDENT_AMBULATORY_CARE_PROVIDER_SITE_OTHER): Payer: BLUE CROSS/BLUE SHIELD | Admitting: Pharmacist Clinician (PhC)/ Clinical Pharmacy Specialist

## 2018-05-03 DIAGNOSIS — Z7901 Long term (current) use of anticoagulants: Secondary | ICD-10-CM | POA: Diagnosis not present

## 2018-05-03 DIAGNOSIS — I480 Paroxysmal atrial fibrillation: Secondary | ICD-10-CM

## 2018-05-03 DIAGNOSIS — Z5181 Encounter for therapeutic drug level monitoring: Secondary | ICD-10-CM

## 2018-05-03 LAB — POCT INR: INR: 1.7 — AB (ref 2.0–3.0)

## 2018-05-09 MED FILL — WARFARIN SODIUM 5 MG TABLET: 5 | 30 days supply | Qty: 34 | Fill #5

## 2018-05-18 ENCOUNTER — Encounter: Payer: Self-pay | Admitting: Family Medicine

## 2018-05-18 ENCOUNTER — Ambulatory Visit (INDEPENDENT_AMBULATORY_CARE_PROVIDER_SITE_OTHER): Payer: BLUE CROSS/BLUE SHIELD | Admitting: Family Medicine

## 2018-05-18 VITALS — BP 184/92 | HR 84 | Temp 97.7°F | Ht 66.0 in | Wt 192.0 lb

## 2018-05-18 DIAGNOSIS — E1165 Type 2 diabetes mellitus with hyperglycemia: Secondary | ICD-10-CM | POA: Diagnosis not present

## 2018-05-18 DIAGNOSIS — Z09 Encounter for follow-up examination after completed treatment for conditions other than malignant neoplasm: Secondary | ICD-10-CM

## 2018-05-18 DIAGNOSIS — R809 Proteinuria, unspecified: Secondary | ICD-10-CM

## 2018-05-18 DIAGNOSIS — R42 Dizziness and giddiness: Secondary | ICD-10-CM | POA: Diagnosis not present

## 2018-05-18 DIAGNOSIS — R8290 Unspecified abnormal findings in urine: Secondary | ICD-10-CM

## 2018-05-18 DIAGNOSIS — R030 Elevated blood-pressure reading, without diagnosis of hypertension: Secondary | ICD-10-CM | POA: Diagnosis not present

## 2018-05-18 DIAGNOSIS — I10 Essential (primary) hypertension: Secondary | ICD-10-CM

## 2018-05-18 DIAGNOSIS — E876 Hypokalemia: Secondary | ICD-10-CM

## 2018-05-18 LAB — POCT URINALYSIS DIP (MANUAL ENTRY)
Bilirubin, UA: NEGATIVE
Glucose, UA: 100 mg/dL — AB
Ketones, POC UA: NEGATIVE mg/dL
Leukocytes, UA: NEGATIVE
Nitrite, UA: NEGATIVE
Protein Ur, POC: >=300 mg/dL — AB
Spec Grav, UA: 1.025 (ref 1.010–1.025)
Urobilinogen, UA: 0.2 E.U./dL
pH, UA: 7 (ref 5.0–8.0)

## 2018-05-18 LAB — POCT GLYCOSYLATED HEMOGLOBIN (HGB A1C): Hemoglobin A1C: 9.7 % — AB (ref 4.0–5.6)

## 2018-05-18 MED ORDER — CLONIDINE HCL 0.1 MG PO TABS
0.2000 mg | ORAL_TABLET | Freq: Once | ORAL | Status: AC
  Administered 2018-05-18: 0.2 mg via ORAL

## 2018-05-18 MED ORDER — POTASSIUM CHLORIDE CRYS ER 10 MEQ PO TBCR: EXTENDED_RELEASE_TABLET | ORAL | 3 refills | Status: DC

## 2018-05-18 MED ORDER — CLONIDINE HCL 0.1 MG PO TABS
0.1000 mg | ORAL_TABLET | Freq: Once | ORAL | Status: AC
  Administered 2018-05-18: 0.1 mg via ORAL

## 2018-05-18 MED FILL — POTASSIUM CHLORIDE ER 10 ME: 10 | 30 days supply | Qty: 30 | Fill #0

## 2018-05-18 NOTE — Progress Notes (Signed)
Follow Up  Subjective:    Patient ID: Amber Young, female    DOB: 01/06/1956, 62 y.o.   MRN: [id number]  Chief Complaint  Patient presents with  . Follow-up    chronic condition      HPI  Amber Young is a 62 year old female with a past medical history of Hypertension, Diabetes, Proliferative Diabetic Retinopathy, Hyperlipidemia, Heart Murmur, GERD, CAD, CKD, and CHF. She is here today for follow up.   Patient states that she does not takes Carvedilol, Hydralazine, Losartan in the mornings because these medications cause her to become extremely dizzy. She states that she does not take these pills in the morning when she goes out. She does not take Hydralazine as prescribed, but only takes dosage of 50 mg BID. Patient states that she has to actually 'hold on to the walls' to move around without falling whenever she takes these medications. She states that she missed previously scheduled appointment the end of 03/2018 with Cardiologist, and is currently scheduled to follow up with him 07/2018.   She has occasional shortness of breath. She denies visual changes, chest pain, cough, heart palpitations, and falls. She has occasionally headaches and extreme dizziness with position changes. Denies severe headaches, confusion, seizures, double vision, and blurred vision, nausea and vomiting.  She denies increased thirst, frequent urination, hunger, fatigue, blurred vision, excessive hunger, excessive thirst, weight gain, weight loss, and poor wound healing.   She denies fevers, chills, fatigue, recent infections, weight loss, and night sweats. No reports of GI problems such as diarrhea, and constipation. She has no reports of blood in stools, dysuria and hematuria. No depression or anxiety reported. She denies pain today.   Past Medical History:  Diagnosis Date  . Anemia   . CHF (congestive heart failure) ([place])   . CKD (chronic kidney disease), stage III ([place])   . Coronary artery disease   .  GERD (gastroesophageal reflux disease)   . Heart murmur   . Hyperlipidemia   . Hypertension   . 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 Stroke ([place]) 09/2014   numbness left upper lip, finger tips on left hand; "resolved" (05/18/2016)  . Type II diabetes mellitus ([place])   . Wears glasses     Family History  Problem Relation Age of Onset  . Diabetes Mother   . Hypertension Mother   . COPD Mother   . Heart failure Mother   . Hypertension Sister   . Hypertension Brother     Social History   Socioeconomic History  . Marital status: Married    Spouse name: Not on file  . Number of children: Not on file  . Years of education: 47  . Highest education level: Not on file  Occupational History  . Occupation: hairdresser  Social Needs  . Financial resource strain: Not on file  . Food insecurity:    Worry: Not on file    Inability: Not on file  . Transportation needs:    Medical: Not on file    Non-medical: Not on file  Tobacco Use  . Smoking status: Never Smoker  . Smokeless tobacco: Never Used  Substance and Sexual Activity  . Alcohol use: No    Alcohol/week: 0.0 standard drinks  . Drug use: No  . Sexual activity: Yes  Lifestyle  . Physical activity:    Days per week: Not on file    Minutes per session: Not on  file  . Stress: Not on file  Relationships  . Social connections:    Talks on phone: Not on file    Gets together: Not on file    Attends religious service: Not on file    Active member of club or organization: Not on file    Attends meetings of clubs or organizations: Not on file    Relationship status: Not on file  . Intimate partner violence:    Fear of current or ex partner: Not on file    Emotionally abused: Not on file    Physically abused: Not on file    Forced sexual activity: Not on file  Other Topics Concern  . Not on file  Social History Narrative   Married, 2 children,  hairdresser   Caffeine use- occasional tea or coffee   Right handed    Past Surgical History:  Procedure Laterality Date  . CARDIAC CATHETERIZATION N/A 05/18/2016   Procedure: Left Heart Cath and Coronary Angiography;  Surgeon: Jettie Booze, MD;  Location: [facility];  Service: Cardiovascular;  Laterality: N/A;  . CARDIAC CATHETERIZATION N/A 05/18/2016   Procedure: Coronary Stent Intervention;  Surgeon: Jettie Booze, MD;  Location: [facility];  Service: Cardiovascular;  Laterality: N/A;  . COLONOSCOPY W/ BIOPSIES AND POLYPECTOMY    . CORONARY ANGIOPLASTY    . CORONARY ANGIOPLASTY WITH STENT PLACEMENT    . CORONARY STENT INTERVENTION N/A 09/07/2017   Procedure: CORONARY STENT INTERVENTION;  Surgeon: Leonie Man, MD;  Location: [facility];  Service: Cardiovascular;  Laterality: N/A;  . EYE SURGERY    . GAS INSERTION Left 01/13/2018   Procedure: INSERTION OF GAS;  Surgeon: Bernarda Caffey, MD;  Location: [facility];  Service: Ophthalmology;  Laterality: Left;  . LEFT HEART CATH AND CORONARY ANGIOGRAPHY N/A 09/07/2017   Procedure: LEFT HEART CATH AND CORONARY ANGIOGRAPHY;  Surgeon: Leonie Man, MD;  Location: [facility];  Service: Cardiovascular;  Laterality: N/A;  . MEMBRANE PEEL Left 01/13/2018   Procedure: MEMBRANE PEEL LEFT EYE ;  Surgeon: Bernarda Caffey, MD;  Location: [facility];  Service: Ophthalmology;  Laterality: Left;  Marland Kitchen MULTIPLE TOOTH EXTRACTIONS    . PARS PLANA VITRECTOMY Left 01/13/2018   Procedure: PARS PLANA VITRECTOMY WITH 25 GAUGE LEFT EYE WITH ENDOLASER;  Surgeon: Bernarda Caffey, MD;  Location: [facility];  Service: Ophthalmology;  Laterality: Left;  . TUBAL LIGATION  1980  . VAGINAL HYSTERECTOMY  1999   "partial; fibroids"    Immunization History  Administered Date(s) Administered  . Tdap 01/05/2017    Current Meds  Medication Sig  . amLODipine (NORVASC) 10 MG tablet Take 1 tablet (10 mg total) by mouth daily.  Marland Kitchen atorvastatin (LIPITOR)  40 MG tablet Take 1 tablet (40 mg total) by mouth daily at 6 PM.  . carvedilol (COREG) 25 MG tablet Take 2 tablets (50 mg total) by mouth 2 (two) times daily.  . ferrous sulfate (FERROUSUL) 325 (65 FE) MG tablet Take 1 tablet (325 mg total) by mouth every other day.  . furosemide (LASIX) 40 MG tablet Take 1 tablet (40 mg total) by mouth daily.  . Glucose Blood (BLOOD GLUCOSE TEST STRIPS) STRP Use as directed  . hydrALAZINE (APRESOLINE) 50 MG tablet Take 1.5 tablets (75 mg total) by mouth 3 (three) times daily. (Patient taking differently: Take 50 mg by mouth 3 (three) times daily. )  . hydrochlorothiazide (HYDRODIURIL) 25 MG tablet Take 1 tablet (25 mg total)  by mouth daily.  . insulin glargine (LANTUS) 100 UNIT/ML injection Inject 0.2 mLs (20 Units total) into the skin 2 (two) times daily.  . Insulin Pen Needle 29G X 10MM MISC Use as directed  . INSULIN SYRINGE .5CC/29G 29G X 1/2" 0.5 ML MISC Use to administer insulin three times daily  . losartan (COZAAR) 100 MG tablet Take 1 tablet (100 mg total) by mouth daily.  . nitroGLYCERIN (NITROSTAT) 0.4 MG SL tablet Place 1 tablet (0.4 mg total) under the tongue every 5 (five) minutes x 3 doses as needed for chest pain.  . potassium chloride (K-DUR,KLOR-CON) 10 MEQ tablet Take 1 tablet with each dose of furosemide, daily.  . prednisoLONE acetate (PRED FORTE) 1 % ophthalmic suspension Place 1 drop into the left eye 4 (four) times daily.  . ticagrelor (BRILINTA) 90 MG TABS tablet Take 1 tablet (90 mg total) by mouth 2 (two) times daily.  Marland Kitchen warfarin (COUMADIN) 5 MG tablet Take 1 to 1.5 tablets by mouth daily as directed by coumadin clinic  . [DISCONTINUED] potassium chloride SA (K-DUR,KLOR-CON) 10 MEQ tablet Take 1 tablet with each dose of furosemide, daily.   Current Facility-Administered Medications for the 05/18/18 encounter (Office Visit) with Azzie Glatter, FNP  Medication  . aflibercept (EYLEA) SOLN 2 mg  . aflibercept (EYLEA) SOLN 2 mg  .  Bevacizumab (AVASTIN) SOLN 1.25 mg  . Bevacizumab (AVASTIN) SOLN 1.25 mg  . Bevacizumab (AVASTIN) SOLN 1.25 mg    Allergies  Allergen Reactions  . Ace Inhibitors Cough   BP (!) 180/96   Pulse 82   Temp 97.7 F (36.5 C) (Oral)   Ht 5\' 6"  (1.676 m)   Wt 192 lb (87.1 kg)   SpO2 100%   BMI 30.99 kg/m   Review of Systems  HENT: Negative.   Respiratory: Positive for shortness of breath (Occasionally).   Cardiovascular: Negative.   Gastrointestinal: Negative.   Genitourinary: Negative.   Musculoskeletal: Negative.   Neurological: Positive for dizziness (extreme ) and weakness (Generalized. ).    Objective:   Physical Exam  Constitutional: She is oriented to person, place, and time. She appears well-developed and well-nourished.  Cardiovascular: Normal rate and regular rhythm.  Pulmonary/Chest: Effort normal and breath sounds normal.  Abdominal: Soft. Bowel sounds are normal.  Neurological: She is alert and oriented to person, place, and time.  Skin: Skin is warm and dry.  Psychiatric: She has a normal mood and affect. Her behavior is normal. Judgment and thought content normal.  Nursing note and vitals reviewed.  Assessment & Plan:   1. Uncontrolled type 2 diabetes mellitus with hyperglycemia (HCC) Hgb A1c increased at 9.7 today, from 9.0 on 01/24/2018.  She will continue to decrease foods/beverages high in sugars and carbs and follow Heart Healthy or DASH diet. Increase physical activity to at least 30 minutes cardio exercise daily.  - POCT glycosylated hemoglobin (Hb A1C) - POCT urinalysis dipstick  2. Essential hypertension Blood pressures are elevated today. Clonidine 0.3 mg given in office today. We were able to schedule her for follow up with Cardiologist, on 05/20/2018 at 2:30 pm.  - cloNIDine (CATAPRES) tablet 0.2 mg - cloNIDine (CATAPRES) tablet 0.1 mg  3. Elevated blood pressure reading Referred her back to Cardiologist for further evaluation.   4.  Dizziness Hypertensive medications cause her to have extreme dizziness. Advised her to be move slowly upon ambulation.   5. Hypokalemia Continue Potassium as prescribed.  - potassium chloride (K-DUR,KLOR-CON) 10 MEQ tablet; Take 1 tablet  with each dose of furosemide, daily.  Dispense: 30 tablet; Refill: 3  6. Abnormal urine - Urine Culture  7. Proteinuria, unspecified type Protein is > 300 today.  - Microalbumin/Creatinine Ratio, Urine  8. Follow up She will follow up on 05/20/2018 with Cardiologist. We will follow up with her in 2 weeks.   Meds ordered this encounter  Medications  . cloNIDine (CATAPRES) tablet 0.2 mg  . potassium chloride (K-DUR,KLOR-CON) 10 MEQ tablet    Sig: Take 1 tablet with each dose of furosemide, daily.    Dispense:  30 tablet    Refill:  [place],  MSN, FNP-C [facility] [address] [place], [place] [address] [phone]

## 2018-05-19 LAB — MICROALBUMIN / CREATININE URINE RATIO
Creatinine, Urine: 104.9 mg/dL
Microalb/Creat Ratio: 5790.7 mg/g creat — ABNORMAL HIGH (ref 0.0–30.0)
Microalbumin, Urine: 6074.4 ug/mL

## 2018-05-20 ENCOUNTER — Encounter: Payer: Self-pay | Admitting: Physician Assistant

## 2018-05-20 ENCOUNTER — Ambulatory Visit: Payer: BLUE CROSS/BLUE SHIELD | Admitting: Physician Assistant

## 2018-05-20 VITALS — BP 142/70 | HR 59 | Ht 66.0 in | Wt 191.8 lb

## 2018-05-20 DIAGNOSIS — I5043 Acute on chronic combined systolic (congestive) and diastolic (congestive) heart failure: Secondary | ICD-10-CM | POA: Diagnosis not present

## 2018-05-20 DIAGNOSIS — I2510 Atherosclerotic heart disease of native coronary artery without angina pectoris: Secondary | ICD-10-CM | POA: Diagnosis not present

## 2018-05-20 DIAGNOSIS — I10 Essential (primary) hypertension: Secondary | ICD-10-CM | POA: Diagnosis not present

## 2018-05-20 LAB — URINE CULTURE

## 2018-05-20 NOTE — Patient Instructions (Signed)
Medication Instructions:  Your physician recommends that you continue on your current medications as directed. Please refer to the Current Medication list given to you today.  Increase Lasix and Potassium for 3 Days   Take your Losartan and Amlodipine at night.   If you need a refill on your cardiac medications before your next appointment, please call your pharmacy.   Lab work: Your physician recommends that you return for lab work in 1 week.   If you have labs (blood work) drawn today and your tests are completely normal, you will receive your results only by: Marland Kitchen MyChart Message (if you have MyChart) OR . A paper copy in the mail If you have any lab test that is abnormal or we need to change your treatment, we will call you to review the results.  Testing/Procedures: NONE   Follow-Up: At [facility], you and your health needs are our priority.  As part of our continuing mission to provide you with exceptional heart care, we have created designated Provider Care Teams.  These Care Teams include your primary Cardiologist (physician) and Advanced Practice Providers (APPs -  Physician Assistants and Nurse Practitioners) who all work together to provide you with the care you need, when you need it. You will need a follow up appointment in 6 weeks.  Please call our office 2 months in advance to schedule this appointment.  You may see Kate Sable, MD or one of the following Advanced Practice Providers on your designated Care Team:   Bernerd Pho, PA-C [facility]) . Ermalinda Barrios, PA-C ([facility])  Any Other Special Instructions Will Be Listed Below (If Applicable).  Your physician recommends that you weigh, daily, at the same time every day, and in the same amount of clothing. Please record your daily weights on the handout provided and bring it to your next appointment.    Thank you for choosing [facility]!

## 2018-05-20 NOTE — Progress Notes (Signed)
Cardiology Office Note   Date:  05/20/2018   ID:  Amber Young, DOB 1956-04-22, MRN [id number]  PCP:  Azzie Glatter, FNP Cardiologist:  Kate Sable, MD 01/18/2018 Rosaria Ferries, PA-C   No chief complaint on file.   History of Present Illness: Amber Young is a 62 y.o. female with a history of HTN, DM, HLD, CVA, GERD, anemia, DES x2 RCA 09/07/2017 w/ med rx for 40% LAD, prev LAD stent ok, EF 40-45% by echo 08/2017 w/ grade 3 dd, S-D-CHF  6/11 cardiology visit, weight 197 pounds but volume felt okay, blood pressure was normal, carotid Dopplers repeated, rare palpitations, continue carvedilol and Coumadin 10/9 office visit with PCP, blood pressure 180/96, clonidine 0.3 mg given in the office with improvement, appointment made.  Patient states carvedilol, hydralazine, and losartan make her extremely dizzy, she was taking the hydralazine 50 mg twice daily  Amber Young presents for cardiology follow up.  She takes amlodipine qam, Lipitor in the afternoon.  She takes Coreg 50 mg bid, takes Hydralazine 75 mg tid on some days but takes it bid on days when she has appts, to minimize the dizziness. She uses her pill-pack to know if she has taken the meds, because she is on so much.   She gets dizzy from most medications, including Brilinta.   She also feels the Brilinta makes her heart pound at times. She notices it the most at bedtime.  She does not exercise, was doing cardiac rehab but something made her quit. She does not know what.  She goes out w/ her daughter and walks when she does that. She has some SOB but no CP.  She will occasionally get CP if she lifts something heavy, not sure it is angina.   Sometimes wakes w/ LE edema. Not often. Had some LLE edema a couple of weeks ago. Tries to eat a low Na diet. Does not weigh daily, but can.    Past Medical History:  Diagnosis Date  . Anemia   . CHF (congestive heart failure) ([place])     . CKD (chronic kidney disease), stage III ([place])   . Coronary artery disease   . GERD (gastroesophageal reflux disease)   . Heart murmur   . Hyperlipidemia   . Hypertension   . 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 Stroke ([place]) 09/2014   numbness left upper lip, finger tips on left hand; "resolved" (05/18/2016)  . Type II diabetes mellitus ([place])   . Wears glasses     Past Surgical History:  Procedure Laterality Date  . CARDIAC CATHETERIZATION N/A 05/18/2016   Procedure: Left Heart Cath and Coronary Angiography;  Surgeon: Jettie Booze, MD;  Location: [facility];  Service: Cardiovascular;  Laterality: N/A;  . CARDIAC CATHETERIZATION N/A 05/18/2016   Procedure: Coronary Stent Intervention;  Surgeon: Jettie Booze, MD;  Location: [facility];  Service: Cardiovascular;  Laterality: N/A;  . COLONOSCOPY W/ BIOPSIES AND POLYPECTOMY    . CORONARY ANGIOPLASTY    . CORONARY ANGIOPLASTY WITH STENT PLACEMENT    . CORONARY STENT INTERVENTION N/A 09/07/2017   Procedure: CORONARY STENT INTERVENTION;  Surgeon: Leonie Man, MD;  Location: [facility];  Service: Cardiovascular;  Laterality: N/A;  . EYE SURGERY    . GAS INSERTION Left 01/13/2018   Procedure: INSERTION OF GAS;  Surgeon: Bernarda Caffey, MD;  Location: [place];  Service: Ophthalmology;  Laterality: Left;  . LEFT HEART CATH AND CORONARY ANGIOGRAPHY N/A 09/07/2017   Procedure: LEFT HEART CATH AND CORONARY ANGIOGRAPHY;  Surgeon: Leonie Man, MD;  Location: [facility];  Service: Cardiovascular;  Laterality: N/A;  . MEMBRANE PEEL Left 01/13/2018   Procedure: MEMBRANE PEEL LEFT EYE ;  Surgeon: Bernarda Caffey, MD;  Location: [facility] [place];  Service: Ophthalmology;  Laterality: Left;  Marland Kitchen MULTIPLE TOOTH EXTRACTIONS    . PARS PLANA VITRECTOMY Left 01/13/2018   Procedure: PARS PLANA VITRECTOMY WITH 25 GAUGE LEFT EYE WITH ENDOLASER;  Surgeon:  Bernarda Caffey, MD;  Location: [facility];  Service: Ophthalmology;  Laterality: Left;  . TUBAL LIGATION  1980  . VAGINAL HYSTERECTOMY  1999   "partial; fibroids"    Current Outpatient Medications  Medication Sig Dispense Refill  . amLODipine (NORVASC) 10 MG tablet Take 1 tablet (10 mg total) by mouth daily. 90 tablet 2  . atorvastatin (LIPITOR) 40 MG tablet Take 1 tablet (40 mg total) by mouth daily at 6 PM. 90 tablet 1  . carvedilol (COREG) 25 MG tablet Take 2 tablets (50 mg total) by mouth 2 (two) times daily. 60 tablet 3  . ferrous sulfate (FERROUSUL) 325 (65 FE) MG tablet Take 1 tablet (325 mg total) by mouth every other day. 90 tablet 3  . furosemide (LASIX) 40 MG tablet Take 1 tablet (40 mg total) by mouth daily. 90 tablet 0  . Glucose Blood (BLOOD GLUCOSE TEST STRIPS) STRP Use as directed 200 each 0  . hydrALAZINE (APRESOLINE) 50 MG tablet Take 1.5 tablets (75 mg total) by mouth 3 (three) times daily. (Patient taking differently: Take 50 mg by mouth 3 (three) times daily. ) 135 tablet 6  . hydrochlorothiazide (HYDRODIURIL) 25 MG tablet Take 1 tablet (25 mg total) by mouth daily. 30 tablet 1  . insulin glargine (LANTUS) 100 UNIT/ML injection Inject 0.2 mLs (20 Units total) into the skin 2 (two) times daily. 20 mL 11  . insulin lispro (HUMALOG) 100 UNIT/ML injection Inject 0.05 mLs (5 Units total) into the skin 3 (three) times daily with meals. (Patient not taking: Reported on 05/18/2018) 30 mL 3  . Insulin Pen Needle 29G X 10MM MISC Use as directed 100 each 3  . INSULIN SYRINGE .5CC/29G 29G X 1/2" 0.5 ML MISC Use to administer insulin three times daily 200 each 5  . losartan (COZAAR) 100 MG tablet Take 1 tablet (100 mg total) by mouth daily. 30 tablet 1  . nitroGLYCERIN (NITROSTAT) 0.4 MG SL tablet Place 1 tablet (0.4 mg total) under the tongue every 5 (five) minutes x 3 doses as needed for chest pain. 30 tablet 12  . potassium chloride (K-DUR,KLOR-CON) 10 MEQ tablet Take 1 tablet with each  dose of furosemide, daily. 30 tablet 3  . prednisoLONE acetate (PRED FORTE) 1 % ophthalmic suspension Place 1 drop into the left eye 4 (four) times daily. 10 mL 1  . ticagrelor (BRILINTA) 90 MG TABS tablet Take 1 tablet (90 mg total) by mouth 2 (two) times daily. 180 tablet 3  . warfarin (COUMADIN) 5 MG tablet Take 1 to 1.5 tablets by mouth daily as directed by coumadin clinic 120 tablet 1   Current Facility-Administered Medications  Medication Dose Route Frequency Provider Last Rate Last Dose  . aflibercept (EYLEA) SOLN 2 mg  2 mg Intravitreal  Bernarda Caffey, MD   2 mg at 03/31/18 1056  . aflibercept (EYLEA) SOLN 2 mg  2 mg Intravitreal  Bernarda Caffey, MD  2 mg at 04/28/18 1419  . Bevacizumab (AVASTIN) SOLN 1.25 mg  1.25 mg Intravitreal  Bernarda Caffey, MD   1.25 mg at 02/14/18 1150  . Bevacizumab (AVASTIN) SOLN 1.25 mg  1.25 mg Intravitreal  Bernarda Caffey, MD   1.25 mg at 03/31/18 1057  . Bevacizumab (AVASTIN) SOLN 1.25 mg  1.25 mg Intravitreal  Bernarda Caffey, MD   1.25 mg at 04/28/18 1419    Allergies:   Ace inhibitors    Social History:  The patient  reports that she has never smoked. She has never used smokeless tobacco. She reports that she does not drink alcohol or use drugs.   Family History:  The patient's family history includes COPD in her mother; Diabetes in her mother; Heart failure in her mother; Hypertension in her brother, mother, and sister.  She indicated that her mother is alive. She indicated that her father is deceased. She indicated that both of her sisters are alive. She indicated that her brother is alive.    ROS:  Please see the history of present illness. All other systems are reviewed and negative.    PHYSICAL EXAM: VS:  BP (!) 142/70   Pulse (!) 59   Ht 5\' 6"  (1.676 m)   Wt 191 lb 12.8 oz (87 kg)   SpO2 98%   BMI 30.96 kg/m  , BMI Body mass index is 30.96 kg/m. GEN: Well nourished, well developed, female in no acute distress HEENT: normal for age    Neck: no JVD, no carotid bruit, no masses Cardiac: RRR; soft murmur, no rubs, or gallops Respiratory: Mild rales bases bilaterally, normal work of breathing GI: soft, nontender, nondistended, + BS MS: no deformity or atrophy; trace-1+ lower extremity edema; distal pulses are 2+ in all 4 extremities  Skin: warm and dry, no rash Neuro:  Strength and sensation are intact Psych: euthymic mood, full affect   EKG:  EKG is not ordered today.  ECHO: 09/08/2017 - Left ventricle: The cavity size was mildly dilated. There was   severe concentric hypertrophy. Systolic function was mildly to   moderately reduced. The estimated ejection fraction was in the   range of 40% to 45%. Mild diffuse hypokinesis with distinct   regional wall motion abnormalities. There is akinesis of the   midanteroseptal and inferoseptal myocardium. There is akinesis of   the apicalinferior myocardium. There was a reduced contribution   of atrial contraction to ventricular filling, due to increased   ventricular diastolic pressure or atrial contractile dysfunction.   Doppler parameters are consistent with a reversible restrictive   pattern, indicative of decreased left ventricular diastolic   compliance and/or increased left atrial pressure (grade 3   diastolic dysfunction). Doppler parameters are consistent with   high ventricular filling pressure. - Mitral valve: There was mild regurgitation. - Left atrium: The atrium was mildly to moderately dilated. - Right atrium: The atrium was mildly dilated. - Pulmonary arteries: PA peak pressure: 43 mm Hg (S). - Inferior vena cava: The vessel was mildly dilated.  Impressions:  - The right ventricular systolic pressure was increased consistent   with moderate pulmonary hypertension.  CATH: 09/07/2017  Prox RCA lesion is 75% stenosed.  A drug-eluting stent was successfully placed using a STENT PROMUS PREM MR 2.75X16.  Post intervention, there is a 5% residual  stenosis.  Ost RCA lesion is 85% stenosed.  A drug-eluting stent was successfully placed using a STENT PROMUS PREM MR 3.0X12. Overlaps Stent 1 proximally.  Post intervention, there  is a 0% residual stenosis.  Mid RCA-1 lesion is 30% stenosed. Mid RCA-2 lesion is 25% stenosed.  Ost 1st Diag lesion is 30% stenosed. Ost 2nd Diag to 2nd Diag lesion is 80% stenosed. Stable.  Prox LAD lesion is 40% stenosed. Previously placed Mid LAD Promus drug eluting stent is widely patent.  There is mild left ventricular systolic dysfunction. The left ventricular ejection fraction is 45-50% by visual estimate.  LV end diastolic pressure is mildly elevated.   Severe Ostial & proximal RCA stenosis (progression of disease from Oct 2017).  Successful PCI using 2 overlapping DES.  Difficult, ostial lesion PCI requiring multiple balloons, buddy wire & extra support wire.  Plan:   Overnight monitoring in the posterior unit (6C)  TR band removal per protocol  Continue DAPT -at least 1 more year\  Continue aggressive risk factor modification.   CAROTID DOPPLERS: 01/25/2018 RIGHT CAROTID ARTERY: Minor intimal thickening and scattered atherosclerosis. No hemodynamically significant right ICA stenosis, velocity elevation, or turbulent flow. Degree of narrowing less than 50%.  RIGHT VERTEBRAL ARTERY:  Antegrade  LEFT CAROTID ARTERY: Similar scattered minor intimal thickening and atherosclerosis. No hemodynamically significant left ICA stenosis, velocity elevation, or turbulent flow.  LEFT VERTEBRAL ARTERY:  Antegrade  IMPRESSION: Minor carotid intimal thickening and atherosclerosis. No hemodynamically significant ICA stenosis by ultrasound. Degree of narrowing less than 50% bilaterally.  Patent antegrade vertebral flow bilaterally.   Recent Labs: 11/17/2017: B Natriuretic Peptide 960.6 01/24/2018: ALT 30; BUN 21; Creatinine, Ser 1.48; Hemoglobin 8.4; Platelets 356; Potassium 4.7; Sodium  141; TSH 1.730  CBC    Component Value Date/Time   WBC 6.9 01/24/2018 1048   WBC 5.0 01/13/2018 0914   RBC 3.49 (L) 01/24/2018 1048   RBC 3.29 (L) 01/13/2018 0914   HGB 8.4 (L) 01/24/2018 1048   HCT 27.2 (L) 01/24/2018 1048   PLT 356 01/24/2018 1048   MCV 78 (L) 01/24/2018 1048   MCH 24.1 (L) 01/24/2018 1048   MCH 23.4 (L) 01/13/2018 0914   MCHC 30.9 (L) 01/24/2018 1048   MCHC 30.9 01/13/2018 0914   RDW 17.6 (H) 01/24/2018 1048   LYMPHSABS 1.6 01/24/2018 1048   MONOABS 0.4 12/13/2017 1049   EOSABS 0.1 01/24/2018 1048   BASOSABS 0.0 01/24/2018 1048   CMP Latest Ref Rng & Units 01/24/2018 01/13/2018 12/13/2017  Glucose 65 - 99 mg/dL 253(H) 83 307(H)  BUN 8 - 27 mg/dL 21 25(H) 33(H)  Creatinine 0.57 - 1.00 mg/dL 1.48(H) 1.74(H) 1.71(H)  Sodium 134 - 144 mmol/L 141 142 139  Potassium 3.5 - 5.2 mmol/L 4.7 3.9 4.2  Chloride 96 - 106 mmol/L 108(H) 113(H) 108  CO2 20 - 29 mmol/L 22 22 25   Calcium 8.7 - 10.3 mg/dL 8.6(L) 8.4(L) 8.9  Total Protein 6.0 - 8.5 g/dL 6.4 - 7.1  Total Bilirubin 0.0 - 1.2 mg/dL <0.2 - 0.2  Alkaline Phos 39 - 117 IU/L 169(H) - 113  AST 0 - 40 IU/L 13 - 15  ALT 0 - 32 IU/L 30 - 9     Lipid Panel    Component Value Date/Time   CHOL 190 01/05/2017 1146   TRIG 74 01/05/2017 1146   HDL 80 01/05/2017 1146   CHOLHDL 2.4 01/05/2017 1146   VLDL 15 01/05/2017 1146   LDLCALC 95 01/05/2017 1146     Wt Readings from Last 3 Encounters:  05/20/18 191 lb 12.8 oz (87 kg)  05/18/18 192 lb (87.1 kg)  04/18/18 190 lb (86.2 kg)  Other studies Reviewed: Additional studies/ records that were reviewed today include: office notes, hospital records and testing.  ASSESSMENT AND PLAN:  1.  Acute on chronic combined CHF: increase Lasix to 40 mg bid for 3 days Increase Kdur as well. Check BMET in a week. -Encouraged daily weights and adherence to low-sodium diet.  2.  CAD: No aspirin because of the Coumadin.  Continue Brilinta.  Her last stent was in January 2019 -  Dr. Bronson Ing to address if she can change the Brilinta to Plavix. -Dr. Bronson Ing to address if the antiplatelet therapy is to be stopped in a year or continued -Continue beta-blocker and statin -The chest pain that she has is atypical and not necessarily angina.  No further work-up at this time.  She understands to let us know if she has more frequent or severe symptoms.  3.  Hypertension: I suspect her blood pressure takes large swings during the day and that may contribute to the dizziness.  She is to start taking the amlodipine and the losartan at bedtime. - Her blood pressure is not that much above target in the office today.  If she can have fewer side effects from the medications, we may not have to make any changes.  If she can get to the point that she can tolerate her medications but needs more control, increase the hydralazine to 100 mg 3 times daily. -If more control as needed, discuss with the pharmacist if Cardura would be a good option.  Current medicines are reviewed at length with the patient today.  The patient has concerns regarding medicines.  Concerns were addressed  The following changes have been made: Increase Lasix and potassium for 3 days  Labs/ tests ordered today include:   Orders Placed This Encounter  Procedures  . Basic Metabolic Panel (BMET)     Disposition:   FU with Kate Sable, MD  Signed, Rosaria Ferries, PA-C  05/20/2018 4:09 PM    [place] Phone: [phone]; Fax: [phone]  This note was written with the assistance of speech recognition software.  Please excuse any transcriptional errors.

## 2018-05-23 ENCOUNTER — Ambulatory Visit (INDEPENDENT_AMBULATORY_CARE_PROVIDER_SITE_OTHER): Payer: BLUE CROSS/BLUE SHIELD | Admitting: Pharmacist

## 2018-05-23 DIAGNOSIS — I480 Paroxysmal atrial fibrillation: Secondary | ICD-10-CM

## 2018-05-23 DIAGNOSIS — Z5181 Encounter for therapeutic drug level monitoring: Secondary | ICD-10-CM

## 2018-05-23 LAB — POCT INR: INR: 1.7 — AB (ref 2.0–3.0)

## 2018-05-25 NOTE — Progress Notes (Deleted)
[facility] Note  05/26/2018     CHIEF COMPLAINT Patient presents for No chief complaint on file. s/p 25g PPV/MP/EL/Gas OS for TRD and VH, 01/13/18  HISTORY OF PRESENT ILLNESS: Amber Young is a 62 y.o. female who presents to the clinic today for:     Referring physician: Azzie Glatter, [place], [place] [address]  HISTORICAL INFORMATION:   Selected notes from the MEDICAL RECORD NUMBER Referred by Molli Barrows, FNP for DM exam;  LEE-  Ocular Hx-  PMH- DM (A1C - 9.4 (02.13.19)), CKD, HTN, CAD, CHF, hx of stroke    CURRENT MEDICATIONS: Current Outpatient Medications (Ophthalmic Drugs)  Medication Sig  . prednisoLONE acetate (PRED FORTE) 1 % ophthalmic suspension Place 1 drop into the left eye 4 (four) times daily.   Current Facility-Administered Medications (Ophthalmic Drugs)  Medication Route  . aflibercept (EYLEA) SOLN 2 mg Intravitreal  . aflibercept (EYLEA) SOLN 2 mg Intravitreal   Current Outpatient Medications (Other)  Medication Sig  . amLODipine (NORVASC) 10 MG tablet Take 1 tablet (10 mg total) by mouth daily.  Marland Kitchen atorvastatin (LIPITOR) 40 MG tablet Take 1 tablet (40 mg total) by mouth daily at 6 PM.  . carvedilol (COREG) 25 MG tablet Take 2 tablets (50 mg total) by mouth 2 (two) times daily.  . ferrous sulfate (FERROUSUL) 325 (65 FE) MG tablet Take 1 tablet (325 mg total) by mouth every other day.  . furosemide (LASIX) 40 MG tablet Take 1 tablet (40 mg total) by mouth daily.  . Glucose Blood (BLOOD GLUCOSE TEST STRIPS) STRP Use as directed  . hydrALAZINE (APRESOLINE) 50 MG tablet Take 1.5 tablets (75 mg total) by mouth 3 (three) times daily. (Patient taking differently: Take 50 mg by mouth 3 (three) times daily. )  . hydrochlorothiazide (HYDRODIURIL) 25 MG tablet Take 1 tablet (25 mg total) by mouth daily.  . insulin glargine (LANTUS) 100 UNIT/ML injection Inject 0.2 mLs (20 Units total) into the skin 2  (two) times daily.  . Insulin Pen Needle 29G X 10MM MISC Use as directed  . INSULIN SYRINGE .5CC/29G 29G X 1/2" 0.5 ML MISC Use to administer insulin three times daily  . losartan (COZAAR) 100 MG tablet Take 1 tablet (100 mg total) by mouth daily.  . nitroGLYCERIN (NITROSTAT) 0.4 MG SL tablet Place 1 tablet (0.4 mg total) under the tongue every 5 (five) minutes x 3 doses as needed for chest pain.  . potassium chloride (K-DUR,KLOR-CON) 10 MEQ tablet Take 1 tablet with each dose of furosemide, daily.  . ticagrelor (BRILINTA) 90 MG TABS tablet Take 1 tablet (90 mg total) by mouth 2 (two) times daily.  Marland Kitchen warfarin (COUMADIN) 5 MG tablet Take 1 to 1.5 tablets by mouth daily as directed by coumadin clinic   Current Facility-Administered Medications (Other)  Medication Route  . Bevacizumab (AVASTIN) SOLN 1.25 mg Intravitreal  . Bevacizumab (AVASTIN) SOLN 1.25 mg Intravitreal  . Bevacizumab (AVASTIN) SOLN 1.25 mg Intravitreal      REVIEW OF SYSTEMS:    ALLERGIES Allergies  Allergen Reactions  . Ace Inhibitors Cough    PAST MEDICAL HISTORY Past Medical History:  Diagnosis Date  . Anemia   . CHF (congestive heart failure) ([place])   . CKD (chronic kidney disease), stage III ([place])   . Coronary artery disease   . GERD (gastroesophageal reflux disease)   . Heart murmur   . Hyperlipidemia   . Hypertension   . Proliferative  diabetic retinopathy ([place])    left eye with vitreous hemorrhage and tractional retinal detachment  . Stroke ([place]) 09/2014   numbness left upper lip, finger tips on left hand; "resolved" (05/18/2016)  . Type II diabetes mellitus ([place])   . Wears glasses    Past Surgical History:  Procedure Laterality Date  . CARDIAC CATHETERIZATION N/A 05/18/2016   Procedure: Left Heart Cath and Coronary Angiography;  Surgeon: Jettie Booze, MD;  Location: [place] [facility] [facility];  Service: Cardiovascular;  Laterality: N/A;  . CARDIAC CATHETERIZATION N/A 05/18/2016   Procedure: Coronary  Stent Intervention;  Surgeon: Jettie Booze, MD;  Location: [facility];  Service: Cardiovascular;  Laterality: N/A;  . COLONOSCOPY W/ BIOPSIES AND POLYPECTOMY    . CORONARY ANGIOPLASTY    . CORONARY ANGIOPLASTY WITH STENT PLACEMENT    . CORONARY STENT INTERVENTION N/A 09/07/2017   Procedure: CORONARY STENT INTERVENTION;  Surgeon: Leonie Man, MD;  Location: [facility];  Service: Cardiovascular;  Laterality: N/A;  . EYE SURGERY    . GAS INSERTION Left 01/13/2018   Procedure: INSERTION OF GAS;  Surgeon: Bernarda Caffey, MD;  Location: [facility];  Service: Ophthalmology;  Laterality: Left;  . LEFT HEART CATH AND CORONARY ANGIOGRAPHY N/A 09/07/2017   Procedure: LEFT HEART CATH AND CORONARY ANGIOGRAPHY;  Surgeon: Leonie Man, MD;  Location: [facility];  Service: Cardiovascular;  Laterality: N/A;  . MEMBRANE PEEL Left 01/13/2018   Procedure: MEMBRANE PEEL LEFT EYE ;  Surgeon: Bernarda Caffey, MD;  Location: [facility];  Service: Ophthalmology;  Laterality: Left;  Marland Kitchen MULTIPLE TOOTH EXTRACTIONS    . PARS PLANA VITRECTOMY Left 01/13/2018   Procedure: PARS PLANA VITRECTOMY WITH 25 GAUGE LEFT EYE WITH ENDOLASER;  Surgeon: Bernarda Caffey, MD;  Location: [facility];  Service: Ophthalmology;  Laterality: Left;  . TUBAL LIGATION  1980  . VAGINAL HYSTERECTOMY  1999   "partial; fibroids"    FAMILY HISTORY Family History  Problem Relation Age of Onset  . Diabetes Mother   . Hypertension Mother   . COPD Mother   . Heart failure Mother   . Hypertension Sister   . Hypertension Brother     SOCIAL HISTORY Social History   Tobacco Use  . Smoking status: Never Smoker  . Smokeless tobacco: Never Used  Substance Use Topics  . Alcohol use: No    Alcohol/week: 0.0 standard drinks  . Drug use: No         OPHTHALMIC EXAM:   Not recorded      IMAGING AND PROCEDURES  Imaging and Procedures for 12/07/17           ASSESSMENT/PLAN:  No diagnosis found.  1,2. 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, OU - exam shows significant IRH, macular edema, fine NVE OU; preretinal and vitreous heme OS -- improving OU - FA from prior shows NVE OU - OCT confirms diabetic macular edema, OU -- mild interval improvement today OU - s/p IVA #1 OS (03.13.19), #2 (04.15.19), #3 (05.13.19), #4 (06.03.19), #5 (08.22.19) - s/p IVA #1 OD (03.15.19), #2 (04.15.19), #3 (05.13.19), #4 (07.08.19)  - S/P IVE #1 OD (08.22.19) - sample  - s/p PRP OS (04.05.19), PRP fill-in OS (04.30.19); OD will need PRP eventually - s/p 25g PPV/MP/EL/Gas OS under general anesthesia for VH and TRD repair OS (06.06.19)             - doing well             -  IOP okay today             - cont cosopt BID OS  - cont PF QID OS  - can use PSO ung prn OS - OCT today shows OD with persistent DME--mildly improved from prior, OS interval improvement in DME - recommend IVE OD #2 today (09.19.19), IVA OS #5 today (09.19.19) - RBA of procedure discussed, questions answered - informed consent obtained and signed - see procedure note - E[place] paperwork and benefits investigation initiated on 08.22.19 -- approved for commercial copay assistance - F/U 4 wks  3. Vitreous hemorrhage OS - secondary to #1, above - s/p IVA OS as above - s/p PRP OS (04.05.19 and 04.30.19) - of note pt on warfarin  - s/p PPV w/ laser as above  4. 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 Hypertensive retinopathy OU - discussed importance of tight BP control - monitor  6. 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 - discussed diagnosis and progression - approaching visual significance - discussed likelihood of cataract progression OS following PPV - monitor for now   Ophthalmic Meds Ordered this visit:  No orders of the defined types were  placed in this encounter.      No follow-ups on file.  There are no Patient Instructions on file for this visit.   Explained the diagnoses, plan, and follow up with the patient and they expressed understanding.  Patient expressed understanding of the importance of proper follow up care.     Gardiner Sleeper, M.D., Ph.D. Diseases & Surgery of the Retina and Vitreous [facility]    Abbreviations: M myopia (nearsighted); A astigmatism; H hyperopia (farsighted); P presbyopia; Mrx spectacle prescription;  CTL contact lenses; OD right eye; OS left eye; OU both eyes  XT exotropia; ET esotropia; PEK punctate epithelial keratitis; PEE punctate epithelial erosions; DES dry eye syndrome; MGD meibomian gland dysfunction; ATs artificial tears; PFAT's preservative free artificial tears; [place] nuclear sclerotic cataract; PSC posterior subcapsular cataract; ERM epi-retinal membrane; PVD posterior vitreous detachment; RD retinal detachment; DM diabetes mellitus; DR diabetic retinopathy; NPDR non-proliferative diabetic retinopathy; PDR proliferative diabetic retinopathy; CSME clinically significant macular edema; DME diabetic macular edema; dbh dot blot hemorrhages; CWS cotton wool spot; POAG primary open angle glaucoma; C/D cup-to-disc ratio; HVF humphrey visual field; GVF goldmann visual field; OCT optical coherence tomography; IOP intraocular pressure; BRVO Branch retinal vein occlusion; CRVO central retinal vein occlusion; CRAO central retinal artery occlusion; BRAO branch retinal artery occlusion; RT retinal tear; SB scleral buckle; PPV pars plana vitrectomy; VH Vitreous hemorrhage; PRP panretinal laser photocoagulation; IVK intravitreal kenalog; VMT vitreomacular traction; MH Macular hole;  NVD neovascularization of the disc; NVE neovascularization elsewhere; AREDS age related eye disease study; ARMD age related macular degeneration; POAG primary open angle glaucoma; EBMD  epithelial/anterior basement membrane dystrophy; ACIOL anterior chamber intraocular lens; IOL intraocular lens; PCIOL posterior chamber intraocular lens; Phaco/IOL phacoemulsification with intraocular lens placement; [place] photorefractive keratectomy; LASIK laser assisted in situ keratomileusis; HTN hypertension; DM diabetes mellitus; COPD chronic obstructive pulmonary disease

## 2018-05-26 ENCOUNTER — Encounter (INDEPENDENT_AMBULATORY_CARE_PROVIDER_SITE_OTHER): Payer: Self-pay | Admitting: Ophthalmology

## 2018-05-26 ENCOUNTER — Ambulatory Visit (INDEPENDENT_AMBULATORY_CARE_PROVIDER_SITE_OTHER): Payer: BLUE CROSS/BLUE SHIELD | Admitting: Ophthalmology

## 2018-05-26 DIAGNOSIS — H3581 Retinal edema: Secondary | ICD-10-CM

## 2018-05-26 DIAGNOSIS — H3342 Traction detachment of retina, left eye: Secondary | ICD-10-CM | POA: Diagnosis not present

## 2018-05-26 DIAGNOSIS — H4312 Vitreous hemorrhage, left eye: Secondary | ICD-10-CM | POA: Diagnosis not present

## 2018-05-26 DIAGNOSIS — H25813 Combined forms of age-related cataract, bilateral: Secondary | ICD-10-CM

## 2018-05-26 DIAGNOSIS — E113513 Type 2 diabetes mellitus with proliferative diabetic retinopathy with macular edema, bilateral: Secondary | ICD-10-CM | POA: Diagnosis not present

## 2018-05-26 DIAGNOSIS — H35033 Hypertensive retinopathy, bilateral: Secondary | ICD-10-CM

## 2018-05-26 DIAGNOSIS — I10 Essential (primary) hypertension: Secondary | ICD-10-CM

## 2018-05-26 MED ORDER — KETOROLAC TROMETHAMINE 0.5 % OP SOLN: 1.0000 [drp] | Freq: Four times a day (QID) | OPHTHALMIC | 2 refills | Status: DC

## 2018-05-26 MED FILL — KETOROLAC 0.5% OPHTH SOLUTI: 0.5 | 10 days supply | Qty: 10 | Fill #0

## 2018-05-26 NOTE — Progress Notes (Addendum)
[facility] Note  05/26/2018     CHIEF COMPLAINT Patient presents for Retina Follow Up s/p 25g PPV/MP/EL/Gas OS for TRD and VH, 01/13/18  HISTORY OF PRESENT ILLNESS: Amber Young is a 62 y.o. female who presents to the clinic today for:   HPI    Retina Follow Up    Patient presents with  Diabetic Retinopathy.  In both eyes.  This started 3 months ago.  Severity is mild.  Since onset it is stable.  I, the attending physician,  performed the HPI with the patient and updated documentation appropriately.          Comments    F/U PDR OU. Patient states her vision has been "ok"but her BS have been up/down, Bs 150 (05/25/18) @ Hs, A1C 10 (last week), denies hypo/hyperglycemic episodes, BP has been up, medication times have changed per patient.Denies new visual onsets.Pt is ready for tx if indicated.       Last edited by Bernarda Caffey, MD on 05/26/2018 11:02 AM. (History)    Pt states that she feels like the injections help some days and some days she feels like they do not  Referring physician: Azzie Glatter, [place], [place] [address]  HISTORICAL INFORMATION:   Selected notes from the MEDICAL RECORD NUMBER Referred by Molli Barrows, FNP for DM exam;  LEE-  Ocular Hx-  PMH- DM (A1C - 9.4 (02.13.19)), CKD, HTN, CAD, CHF, hx of stroke    CURRENT MEDICATIONS: Current Outpatient Medications (Ophthalmic Drugs)  Medication Sig  . prednisoLONE acetate (PRED FORTE) 1 % ophthalmic suspension Place 1 drop into the left eye 4 (four) times daily.  Marland Kitchen ketorolac (ACULAR) 0.5 % ophthalmic solution Place 1 drop into the right eye 4 (four) times daily.   Current Facility-Administered Medications (Ophthalmic Drugs)  Medication Route  . aflibercept (EYLEA) SOLN 2 mg Intravitreal  . aflibercept (EYLEA) SOLN 2 mg Intravitreal  . aflibercept (EYLEA) SOLN 2 mg Intravitreal   Current Outpatient Medications (Other)  Medication Sig  .  amLODipine (NORVASC) 10 MG tablet Take 1 tablet (10 mg total) by mouth daily.  Marland Kitchen atorvastatin (LIPITOR) 40 MG tablet Take 1 tablet (40 mg total) by mouth daily at 6 PM.  . carvedilol (COREG) 25 MG tablet Take 2 tablets (50 mg total) by mouth 2 (two) times daily.  . ferrous sulfate (FERROUSUL) 325 (65 FE) MG tablet Take 1 tablet (325 mg total) by mouth every other day.  . furosemide (LASIX) 40 MG tablet Take 1 tablet (40 mg total) by mouth daily.  . Glucose Blood (BLOOD GLUCOSE TEST STRIPS) STRP Use as directed  . hydrALAZINE (APRESOLINE) 50 MG tablet Take 1.5 tablets (75 mg total) by mouth 3 (three) times daily. (Patient taking differently: Take 50 mg by mouth 3 (three) times daily. )  . hydrochlorothiazide (HYDRODIURIL) 25 MG tablet Take 1 tablet (25 mg total) by mouth daily.  . insulin glargine (LANTUS) 100 UNIT/ML injection Inject 0.2 mLs (20 Units total) into the skin 2 (two) times daily.  . Insulin Pen Needle 29G X 10MM MISC Use as directed  . INSULIN SYRINGE .5CC/29G 29G X 1/2" 0.5 ML MISC Use to administer insulin three times daily  . losartan (COZAAR) 100 MG tablet Take 1 tablet (100 mg total) by mouth daily.  . nitroGLYCERIN (NITROSTAT) 0.4 MG SL tablet Place 1 tablet (0.4 mg total) under the tongue every 5 (five) minutes x 3 doses as needed for chest  pain.  . potassium chloride (K-DUR,KLOR-CON) 10 MEQ tablet Take 1 tablet with each dose of furosemide, daily.  . ticagrelor (BRILINTA) 90 MG TABS tablet Take 1 tablet (90 mg total) by mouth 2 (two) times daily.  Marland Kitchen warfarin (COUMADIN) 5 MG tablet Take 1 to 1.5 tablets by mouth daily as directed by coumadin clinic   Current Facility-Administered Medications (Other)  Medication Route  . Bevacizumab (AVASTIN) SOLN 1.25 mg Intravitreal  . Bevacizumab (AVASTIN) SOLN 1.25 mg Intravitreal  . Bevacizumab (AVASTIN) SOLN 1.25 mg Intravitreal  . Bevacizumab (AVASTIN) SOLN 1.25 mg Intravitreal      REVIEW OF SYSTEMS: ROS    Positive for:  Endocrine, Eyes   Negative for: Constitutional, Gastrointestinal, Neurological, Skin, Genitourinary, Musculoskeletal, HENT, Cardiovascular, Respiratory, Psychiatric, Allergic/Imm, Heme/Lymph   Last edited by Zenovia Jordan, LPN on [id number]/[id number] 68:11 AM. (History)       ALLERGIES Allergies  Allergen Reactions  . Ace Inhibitors Cough    PAST MEDICAL HISTORY Past Medical History:  Diagnosis Date  . Anemia   . CHF (congestive heart failure) ([place])   . CKD (chronic kidney disease), stage III ([place])   . Coronary artery disease   . GERD (gastroesophageal reflux disease)   . Heart murmur   . Hyperlipidemia   . Hypertension   . Proliferative diabetic retinopathy ([place])    left eye with vitreous hemorrhage and tractional retinal detachment  . Stroke ([place]) 09/2014   numbness left upper lip, finger tips on left hand; "resolved" (05/18/2016)  . Type II diabetes mellitus ([name])   . Wears glasses    Past Surgical History:  Procedure Laterality Date  . CARDIAC CATHETERIZATION N/A 05/18/2016   Procedure: Left Heart Cath and Coronary Angiography;  Surgeon: Jettie Booze, MD;  Location: [facility];  Service: Cardiovascular;  Laterality: N/A;  . CARDIAC CATHETERIZATION N/A 05/18/2016   Procedure: Coronary Stent Intervention;  Surgeon: Jettie Booze, MD;  Location: [facility];  Service: Cardiovascular;  Laterality: N/A;  . COLONOSCOPY W/ BIOPSIES AND POLYPECTOMY    . CORONARY ANGIOPLASTY    . CORONARY ANGIOPLASTY WITH STENT PLACEMENT    . CORONARY STENT INTERVENTION N/A 09/07/2017   Procedure: CORONARY STENT INTERVENTION;  Surgeon: Leonie Man, MD;  Location: [facility];  Service: Cardiovascular;  Laterality: N/A;  . EYE SURGERY    . GAS INSERTION Left 01/13/2018   Procedure: INSERTION OF GAS;  Surgeon: Bernarda Caffey, MD;  Location: [facility];  Service: Ophthalmology;  Laterality: Left;  . LEFT HEART CATH AND CORONARY ANGIOGRAPHY N/A 09/07/2017   Procedure: LEFT  HEART CATH AND CORONARY ANGIOGRAPHY;  Surgeon: Leonie Man, MD;  Location: [facility];  Service: Cardiovascular;  Laterality: N/A;  . MEMBRANE PEEL Left 01/13/2018   Procedure: MEMBRANE PEEL LEFT EYE ;  Surgeon: Bernarda Caffey, MD;  Location: [facility];  Service: Ophthalmology;  Laterality: Left;  Marland Kitchen MULTIPLE TOOTH EXTRACTIONS    . PARS PLANA VITRECTOMY Left 01/13/2018   Procedure: PARS PLANA VITRECTOMY WITH 25 GAUGE LEFT EYE WITH ENDOLASER;  Surgeon: Bernarda Caffey, MD;  Location: [facility];  Service: Ophthalmology;  Laterality: Left;  . TUBAL LIGATION  1980  . VAGINAL HYSTERECTOMY  1999   "partial; fibroids"    FAMILY HISTORY Family History  Problem Relation Age of Onset  . Diabetes Mother   . Hypertension Mother   . COPD Mother   . Heart failure Mother   . Hypertension Sister   . Hypertension Brother  SOCIAL HISTORY Social History   Tobacco Use  . Smoking status: Never Smoker  . Smokeless tobacco: Never Used  Substance Use Topics  . Alcohol use: No    Alcohol/week: 0.0 standard drinks  . Drug use: No         OPHTHALMIC EXAM:  Base Eye Exam    Visual Acuity (Snellen - Linear)      Right Left   Dist cc 20/100 20/100   Dist ph cc 20/70 20/50   Correction:  Glasses       Tonometry (Tonopen, 9:54 AM)      Right Left   Pressure 21 20       Pupils      Dark Light Shape React APD   Right 4 3 Round Brisk None   Left 4 3 Round Brisk None       Visual Fields (Counting fingers)      Left Right    Full Full       Extraocular Movement      Right Left    Full, Ortho Full, Ortho       Neuro/Psych    Oriented x3:  Yes   Mood/Affect:  Normal       Dilation    Both eyes:  1.0% Mydriacyl, 2.5% Phenylephrine @ 9:55 AM        Slit Lamp and Fundus Exam    Slit Lamp Exam      Right Left   Lids/Lashes Dermatochalasis - upper lid, mild Meibomian gland dysfunction Dermatochalasis - upper lid, Meibomian gland dysfunction   Conjunctiva/Sclera White and quiet  White and quiet   Cornea Arcus, 1+ Punctate epithelial erosions, 1+ pigmented Guttata, irregular tear film Arcus, 1+ Punctate epithelial erosions, EBMD, pigmented Guttata   Anterior Chamber Deep and quiet Deep, 0.5+ pigment   Iris Round and dilated, No NVI Round and dilated, No NVI, persistent pupillary membrane   Lens 2-3+ Nuclear sclerosis, 2+ Cortical cataract, Vacuoles, iridescent refractile opacities 3+ Nuclear sclerosis, 2+ Cortical cataract, Vacuoles, iridescent refractile opacities, 1+ Posterior subcapsular cataract   Vitreous Vitreous syneresis, mild Asteroid hyalosis superiorly Post vitrectomy, +pigment in anterior vitreous       Fundus Exam      Right Left   Disc compact, Pink and Sharp pink and Sharp   C/D Ratio 0.5 0.5   Macula Blunted foveal reflex, +edema - mild interval improvement, +DBH, scattered exudate , Microaneurysms,   Blunted foveal reflex, Microaneurysms, Exudates -- slightly improved   Vessels Tortuous; +fine NV regressing Attenuated; sclerosis temporally   Periphery Attached, 360 DBH Flat under gas, 360 PRP in place; IRH superiorly, scattered dot hemorrhages          IMAGING AND PROCEDURES  Imaging and Procedures for 12/07/17  Intravitreal Injection, Pharmacologic Agent - OD - Right Eye       Time Out 05/26/2018. 11:21 AM. Confirmed correct patient, procedure, site, and patient consented.   Anesthesia Topical anesthesia was used. Anesthetic medications included Lidocaine 2%, Proparacaine 0.5%.   Procedure Preparation included 5% betadine to ocular surface, eyelid speculum. A 30 gauge needle was used.   Injection:  2 mg aflibercept 2 MG/0.05ML   NDC: 61755-005-02, Lot: [id number], Expiration date: 04/08/2018   Route: Intravitreal, Site: Right Eye, Waste: 0.05 mL  Post-op Post injection exam found visual acuity of at least counting fingers. The patient tolerated the procedure well. There were no complications. The patient received written and verbal  post procedure care education.  OCT, Retina - OU - Both Eyes       Right Eye Quality was good. Central Foveal Thickness: 522. Progression has worsened. Findings include abnormal foveal contour, intraretinal fluid, vitreomacular adhesion , no SRF, outer retinal atrophy (Interval improvement in IRF).   Left Eye Quality was good. Central Foveal Thickness: 324. Progression has been stable. Findings include intraretinal fluid, no SRF (Interval improvement in IRF).   Notes *Images captured and stored on drive  Diagnosis / Impression:  Persistent DME OU  Clinical management:  See below  Abbreviations: NFP - Normal foveal profile. CME - cystoid macular edema. PED - pigment epithelial detachment. IRF - intraretinal fluid. SRF - subretinal fluid. EZ - ellipsoid zone. ERM - epiretinal membrane. ORA - outer retinal atrophy. ORT - outer retinal tubulation. SRHM - subretinal hyper-reflective material         Intravitreal Injection, Pharmacologic Agent - OS - Left Eye       Time Out 05/26/2018. 11:25 AM. Confirmed correct patient, procedure, site, and patient consented.   Anesthesia Topical anesthesia was used. Anesthetic medications included Lidocaine 2%, Proparacaine 0.5%.   Procedure Preparation included 5% betadine to ocular surface, eyelid speculum. A 30 gauge needle was used.   Injection:  1.25 mg Bevacizumab 1.25mg /0.39ml   NDC: 56433-295-18, Lot: [id number] , Expiration date: 07/21/2018   Route: Intravitreal, Site: Left Eye, Waste: 0 mg  Post-op Post injection exam found visual acuity of at least counting fingers. The patient tolerated the procedure well. There were no complications. The patient received written and verbal post procedure care education.                 ASSESSMENT/PLAN:    ICD-10-CM   1. Proliferative diabetic retinopathy of both eyes with macular edema associated with type 2 diabetes mellitus (HCC) 25/07/2018 Intravitreal Injection,  Pharmacologic Agent - OD - Right Eye    OCT, Retina - OU - Both Eyes    Intravitreal Injection, Pharmacologic Agent - OS - Left Eye    aflibercept (EYLEA) SOLN 2 mg    Bevacizumab (AVASTIN) SOLN 1.25 mg  2. Retinal edema H35.81 OCT, Retina - OU - Both Eyes  3. Vitreous hemorrhage of left eye (HCC) H43.12   4. 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 Essential hypertension I10   6. Hypertensive retinopathy of both eyes H35.033   7. Combined forms of age-related cataract of both eyes H25.813     1,2. Proliferative diabetic retinopathy with diabetic macular edema, OU - exam shows significant IRH, macular edema, fine NVE OU; preretinal and vitreous heme OS -- improving OU - FA from prior shows NVE OU - OCT confirms diabetic macular edema, OU -- mild interval improvement today OU - A1c 9.7% on 10.09.19 from 9.0% (6.17.19) - s/p IVA #1 OS (03.13.19), #2 (04.15.19), #3 (05.13.19), #4 (06.03.19), #5 (08.22.19), #6 (09.19.19) - s/p IVA #1 OD (03.15.19), #2 (04.15.19), #3 (05.13.19), #4 (07.08.19)  - S/P IVE #1 OD (08.22.19) -- sample, #2 (09.19.19)  - s/p PRP OS (04.05.19), PRP fill-in OS (04.30.19); OD will need PRP eventually - s/p 25g PPV/MP/EL/Gas OS under general anesthesia for VH and TRD repair OS (06.06.19)             - doing well             - IOP okay today             - cont cosopt BID OS  - cont PF QID OS  - start ketorolac QID  OD  - can use PSO ung prn OS - BCVA 20/70 OD, 20/5 OS today - OCT today shows OD with persistent DME--mildly improved from prior, OS interval improvement in DME - recommend IVE OD #3 today (10.17.19), IVA OS #7 today (10.17.19) - RBA of procedures discussed, questions answered - informed consent obtained and signed - see procedure note - Eylea paperwork and benefits investigation initiated on 08.22.19 -- approved for commercial copay assistance - F/U 4 wks  3. Vitreous hemorrhage OS - secondary to #1, above - s/p IVA OS as above - s/p PRP  OS (04.05.19 and 04.30.19) - of note pt on warfarin  - s/p PPV w/ laser as above  4. 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 Hypertensive retinopathy OU - discussed importance of tight BP control - monitor  6. 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 - discussed diagnosis and progression - approaching visual significance - discussed likelihood of cataract progression OS following PPV - monitor for now   Ophthalmic Meds Ordered this visit:  Meds ordered this encounter  Medications  . ketorolac (ACULAR) 0.5 % ophthalmic solution    Sig: Place 1 drop into the right eye 4 (four) times daily.    Dispense:  10 mL    Refill:  2  . aflibercept (EYLEA) SOLN 2 mg  . Bevacizumab (AVASTIN) SOLN 1.25 mg       Return in about 4 weeks (around 06/23/2018) for DFE, OCT.  There are no Patient Instructions on file for this visit.   Explained the diagnoses, plan, and follow up with the patient and they expressed understanding.  Patient expressed understanding of the importance of proper follow up care.   This document serves as a record of services personally performed by Gardiner Sleeper, MD, PhD. It was created on their behalf by Ernest Mallick, OA, an ophthalmic assistant. The creation of this record is the provider's dictation and/or activities during the visit.    Electronically signed by: Ernest Mallick, OA  10.17.19 5:21 PM     Gardiner Sleeper, M.D., Ph.D. Diseases & Surgery of the Retina and Vitreous [facility]   I have reviewed the above documentation for accuracy and completeness, and I agree with the above. Gardiner Sleeper, M.D., Ph.D. 05/29/18 5:21 PM      Abbreviations: M myopia (nearsighted); A astigmatism; H hyperopia (farsighted); P presbyopia; Mrx spectacle prescription;  CTL  contact lenses; OD right eye; OS left eye; OU both eyes  XT exotropia; ET esotropia; PEK punctate epithelial keratitis; PEE punctate epithelial erosions; DES dry eye syndrome; MGD meibomian gland dysfunction; ATs artificial tears; PFAT's preservative free artificial tears; [place] nuclear sclerotic cataract; PSC posterior subcapsular cataract; ERM epi-retinal membrane; PVD posterior vitreous detachment; RD retinal detachment; DM diabetes mellitus; DR diabetic retinopathy; NPDR non-proliferative diabetic retinopathy; PDR proliferative diabetic retinopathy; CSME clinically significant macular edema; DME diabetic macular edema; dbh dot blot hemorrhages; CWS cotton wool spot; POAG primary open angle glaucoma; C/D cup-to-disc ratio; HVF humphrey visual field; GVF goldmann visual field; OCT optical coherence tomography; IOP intraocular pressure; BRVO Branch retinal vein occlusion; CRVO central retinal vein occlusion; CRAO central retinal artery occlusion; BRAO branch retinal artery occlusion; RT retinal tear; SB scleral buckle; PPV pars plana vitrectomy; VH Vitreous hemorrhage; PRP panretinal laser photocoagulation; IVK intravitreal kenalog; VMT vitreomacular traction;  MH Macular hole;  NVD neovascularization of the disc; NVE neovascularization elsewhere; AREDS age related eye disease study; ARMD age related macular degeneration; POAG primary open angle glaucoma; EBMD epithelial/anterior basement membrane dystrophy; ACIOL anterior chamber intraocular lens; IOL intraocular lens; PCIOL posterior chamber intraocular lens; Phaco/IOL phacoemulsification with intraocular lens placement; [place] photorefractive keratectomy; LASIK laser assisted in situ keratomileusis; HTN hypertension; DM diabetes mellitus; COPD chronic obstructive pulmonary disease

## 2018-05-27 DIAGNOSIS — E113513 Type 2 diabetes mellitus with proliferative diabetic retinopathy with macular edema, bilateral: Secondary | ICD-10-CM | POA: Diagnosis not present

## 2018-05-27 DIAGNOSIS — I5043 Acute on chronic combined systolic (congestive) and diastolic (congestive) heart failure: Secondary | ICD-10-CM | POA: Diagnosis not present

## 2018-05-27 LAB — BASIC METABOLIC PANEL
BUN/Creatinine Ratio: 14 (calc) (ref 6–22)
BUN: 27 mg/dL — ABNORMAL HIGH (ref 7–25)
CALCIUM: 8.2 mg/dL — AB (ref 8.6–10.4)
CO2: 24 mmol/L (ref 20–32)
Chloride: 106 mmol/L (ref 98–110)
Creat: 1.87 mg/dL — ABNORMAL HIGH (ref 0.50–0.99)
GLUCOSE: 183 mg/dL — AB (ref 65–139)
Potassium: 3.9 mmol/L (ref 3.5–5.3)
Sodium: 139 mmol/L (ref 135–146)

## 2018-05-27 MED ORDER — AFLIBERCEPT 2MG/0.05ML IZ SOLN FOR KALEIDOSCOPE
2.0000 mg | INTRAVITREAL | Status: DC
  Administered 2018-05-27: 2 mg via INTRAVITREAL

## 2018-05-27 MED ORDER — BEVACIZUMAB CHEMO INJECTION 1.25MG/0.05ML SYRINGE FOR KALEIDOSCOPE
1.2500 mg | INTRAVITREAL | Status: DC
  Administered 2018-05-27: 1.25 mg via INTRAVITREAL

## 2018-05-29 ENCOUNTER — Encounter (INDEPENDENT_AMBULATORY_CARE_PROVIDER_SITE_OTHER): Payer: Self-pay | Admitting: Ophthalmology

## 2018-05-31 ENCOUNTER — Telehealth: Payer: Self-pay

## 2018-05-31 NOTE — Telephone Encounter (Signed)
Called and spoke with patient, she will keep appointment for 06/01/2018. Thanks!

## 2018-06-01 ENCOUNTER — Other Ambulatory Visit: Payer: Self-pay | Admitting: Family Medicine

## 2018-06-01 ENCOUNTER — Ambulatory Visit (INDEPENDENT_AMBULATORY_CARE_PROVIDER_SITE_OTHER): Payer: BLUE CROSS/BLUE SHIELD | Admitting: Family Medicine

## 2018-06-01 ENCOUNTER — Encounter: Payer: Self-pay | Admitting: Family Medicine

## 2018-06-01 VITALS — BP 170/64 | HR 76 | Temp 98.0°F | Ht 66.0 in | Wt 194.6 lb

## 2018-06-01 DIAGNOSIS — Z09 Encounter for follow-up examination after completed treatment for conditions other than malignant neoplasm: Secondary | ICD-10-CM

## 2018-06-01 DIAGNOSIS — R42 Dizziness and giddiness: Secondary | ICD-10-CM

## 2018-06-01 DIAGNOSIS — I10 Essential (primary) hypertension: Secondary | ICD-10-CM

## 2018-06-01 DIAGNOSIS — R600 Localized edema: Secondary | ICD-10-CM

## 2018-06-01 DIAGNOSIS — R030 Elevated blood-pressure reading, without diagnosis of hypertension: Secondary | ICD-10-CM

## 2018-06-01 DIAGNOSIS — R609 Edema, unspecified: Secondary | ICD-10-CM

## 2018-06-01 DIAGNOSIS — E1165 Type 2 diabetes mellitus with hyperglycemia: Secondary | ICD-10-CM

## 2018-06-01 DIAGNOSIS — E876 Hypokalemia: Secondary | ICD-10-CM

## 2018-06-01 LAB — POCT URINALYSIS DIP (MANUAL ENTRY)
Bilirubin, UA: NEGATIVE
Glucose, UA: 250 mg/dL — AB
Ketones, POC UA: NEGATIVE mg/dL
Leukocytes, UA: NEGATIVE
Nitrite, UA: NEGATIVE
Protein Ur, POC: >=300 mg/dL — AB
Spec Grav, UA: 1.025 (ref 1.010–1.025)
Urobilinogen, UA: 0.2 E.U./dL
pH, UA: 6.5 (ref 5.0–8.0)

## 2018-06-01 LAB — GLUCOSE, POCT (MANUAL RESULT ENTRY): POC Glucose: 165 mg/dl — AB (ref 70–99)

## 2018-06-01 MED ORDER — CLONIDINE HCL 0.1 MG PO TABS
0.2000 mg | ORAL_TABLET | Freq: Once | ORAL | Status: AC
  Administered 2018-06-01: 0.2 mg via ORAL

## 2018-06-01 MED ORDER — CLONIDINE HCL 0.1 MG PO TABS
0.1000 mg | ORAL_TABLET | Freq: Once | ORAL | Status: AC
  Administered 2018-06-01: 0.1 mg via ORAL

## 2018-06-01 MED FILL — TRUEPLUS SYR 0.5ML 30GX5/16: 30G X 5/16" | 30 days supply | Qty: 100 | Fill #0

## 2018-06-01 NOTE — Telephone Encounter (Signed)
[facility] does not fill for this provider.

## 2018-06-01 NOTE — Progress Notes (Signed)
Follow Up  Subjective:    Patient ID: Donyel Castagnola, female    DOB: [birth year], 62 y.o.   MRN: [id number]   Chief Complaint  Patient presents with  . Follow-up    HTN   HPI  Ms. Zappia is a 62 year old female with a past Diabetes, Stroke, Hypertension, Hyperlipidemia, Heart Murmur, GERD, CAD, CKD, CHF, and Anemia. She is her today for follow up and assessment of chronic diseases.   Current Status: Since her last office visit, she is doing well with no complaints. Her blood pressure is elevated today.  She is taking most of her antihypertensive meds in the late afternoon, evening, and at night to minimize side effects of dizziness. She continues to follow up with [facility]. She denies visual changes, chest pain, cough, shortness of breath, heart palpitations, and falls. She has occasionally headaches and dizziness with position changes. Denies severe headaches, confusion, seizures, double vision, and blurred vision, nausea and vomiting.  She denies fevers, chills, fatigue, recent infections, weight loss, and night sweats. No reports of GI problems such as diarrhea, and constipation. She has no reports of blood in stools, dysuria and hematuria. No depression or anxiety, and denies suicidal ideations, homicidal ideations, or auditory hallucinations. She denies pain today.   Past Medical History:  Diagnosis Date  . Anemia   . CHF (congestive heart failure) ([place])   . CKD (chronic kidney disease), stage III ([place])   . Coronary artery disease   . GERD (gastroesophageal reflux disease)   . Heart murmur   . Hyperlipidemia   . Hypertension   . 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 Stroke ([place]) 09/2014   numbness left upper lip, finger tips on left hand; "resolved" (05/18/2016)  . Type II diabetes mellitus ([place])   . Wears glasses     Family History  Problem Relation Age of Onset  . Diabetes Mother   .  Hypertension Mother   . COPD Mother   . Heart failure Mother   . Hypertension Sister   . Hypertension Brother     Social History   Socioeconomic History  . Marital status: Married    Spouse name: Not on file  . Number of children: Not on file  . Years of education: 76  . Highest education level: Not on file  Occupational History  . Occupation: hairdresser  Social Needs  . Financial resource strain: Not on file  . Food insecurity:    Worry: Not on file    Inability: Not on file  . Transportation needs:    Medical: Not on file    Non-medical: Not on file  Tobacco Use  . Smoking status: Never Smoker  . Smokeless tobacco: Never Used  Substance and Sexual Activity  . Alcohol use: No    Alcohol/week: 0.0 standard drinks  . Drug use: No  . Sexual activity: Yes  Lifestyle  . Physical activity:    Days per week: Not on file    Minutes per session: Not on file  . Stress: Not on file  Relationships  . Social connections:    Talks on phone: Not on file    Gets together: Not on file    Attends religious service: Not on file    Active member of club or organization: Not on file    Attends meetings of clubs or organizations: Not on file    Relationship status: Not on file  .  Intimate partner violence:    Fear of current or ex partner: Not on file    Emotionally abused: Not on file    Physically abused: Not on file    Forced sexual activity: Not on file  Other Topics Concern  . Not on file  Social History Narrative   Married, 2 children, hairdresser   Caffeine use- occasional tea or coffee   Right handed    Past Surgical History:  Procedure Laterality Date  . CARDIAC CATHETERIZATION N/A 05/18/2016   Procedure: Left Heart Cath and Coronary Angiography;  Surgeon: Jettie Booze, MD;  Location: [facility];  Service: Cardiovascular;  Laterality: N/A;  . CARDIAC CATHETERIZATION N/A 05/18/2016   Procedure: Coronary Stent Intervention;  Surgeon: Jettie Booze, MD;  Location: [facility];  Service: Cardiovascular;  Laterality: N/A;  . COLONOSCOPY W/ BIOPSIES AND POLYPECTOMY    . CORONARY ANGIOPLASTY    . CORONARY ANGIOPLASTY WITH STENT PLACEMENT    . CORONARY STENT INTERVENTION N/A 09/07/2017   Procedure: CORONARY STENT INTERVENTION;  Surgeon: Leonie Man, MD;  Location: [facility];  Service: Cardiovascular;  Laterality: N/A;  . EYE SURGERY    . GAS INSERTION Left 01/13/2018   Procedure: INSERTION OF GAS;  Surgeon: Bernarda Caffey, MD;  Location: [facility];  Service: Ophthalmology;  Laterality: Left;  . LEFT HEART CATH AND CORONARY ANGIOGRAPHY N/A 09/07/2017   Procedure: LEFT HEART CATH AND CORONARY ANGIOGRAPHY;  Surgeon: Leonie Man, MD;  Location: [facility];  Service: Cardiovascular;  Laterality: N/A;  . MEMBRANE PEEL Left 01/13/2018   Procedure: MEMBRANE PEEL LEFT EYE ;  Surgeon: Bernarda Caffey, MD;  Location: [facility];  Service: Ophthalmology;  Laterality: Left;  Marland Kitchen MULTIPLE TOOTH EXTRACTIONS    . PARS PLANA VITRECTOMY Left 01/13/2018   Procedure: PARS PLANA VITRECTOMY WITH 25 GAUGE LEFT EYE WITH ENDOLASER;  Surgeon: Bernarda Caffey, MD;  Location: [facility];  Service: Ophthalmology;  Laterality: Left;  . TUBAL LIGATION  1980  . VAGINAL HYSTERECTOMY  1999   "partial; fibroids"    Immunization History  Administered Date(s) Administered  . Tdap 01/05/2017    Current Meds  Medication Sig  . amLODipine (NORVASC) 10 MG tablet Take 1 tablet (10 mg total) by mouth daily.  Marland Kitchen atorvastatin (LIPITOR) 40 MG tablet Take 1 tablet (40 mg total) by mouth daily at 6 PM.  . carvedilol (COREG) 25 MG tablet Take 2 tablets (50 mg total) by mouth 2 (two) times daily.  . ferrous sulfate (FERROUSUL) 325 (65 FE) MG tablet Take 1 tablet (325 mg total) by mouth every other day.  . furosemide (LASIX) 40 MG tablet Take 1 tablet (40 mg total) by mouth daily.  . Glucose Blood (BLOOD GLUCOSE TEST STRIPS) STRP Use as directed  . hydrALAZINE  (APRESOLINE) 50 MG tablet Take 1.5 tablets (75 mg total) by mouth 3 (three) times daily. (Patient taking differently: Take 50 mg by mouth 3 (three) times daily. )  . hydrochlorothiazide (HYDRODIURIL) 25 MG tablet Take 1 tablet (25 mg total) by mouth daily.  . insulin glargine (LANTUS) 100 UNIT/ML injection Inject 0.2 mLs (20 Units total) into the skin 2 (two) times daily.  . Insulin Pen Needle 29G X 10MM MISC Use as directed  . INSULIN SYRINGE .5CC/29G 29G X 1/2" 0.5 ML MISC Use to administer insulin three times daily  . ketorolac (ACULAR) 0.5 % ophthalmic solution Place 1 drop into the right eye 4 (four) times daily.  Marland Kitchen  losartan (COZAAR) 100 MG tablet Take 1 tablet (100 mg total) by mouth daily.  . potassium chloride (K-DUR,KLOR-CON) 10 MEQ tablet Take 1 tablet with each dose of furosemide, daily.  . prednisoLONE acetate (PRED FORTE) 1 % ophthalmic suspension Place 1 drop into the left eye 4 (four) times daily.  . ticagrelor (BRILINTA) 90 MG TABS tablet Take 1 tablet (90 mg total) by mouth 2 (two) times daily.  Marland Kitchen warfarin (COUMADIN) 5 MG tablet Take 1 to 1.5 tablets by mouth daily as directed by coumadin clinic   Current Facility-Administered Medications for the 06/01/18 encounter (Office Visit) with Azzie Glatter, FNP  Medication  . aflibercept (EYLEA) SOLN 2 mg  . aflibercept (EYLEA) SOLN 2 mg  . aflibercept (EYLEA) SOLN 2 mg  . Bevacizumab (AVASTIN) SOLN 1.25 mg  . Bevacizumab (AVASTIN) SOLN 1.25 mg  . Bevacizumab (AVASTIN) SOLN 1.25 mg  . Bevacizumab (AVASTIN) SOLN 1.25 mg    Allergies  Allergen Reactions  . Ace Inhibitors Cough    BP (!) 170/64   Pulse 76   Temp 98 F (36.7 C) (Oral)   Ht 5\' 6"  (1.676 m)   Wt 194 lb 9.6 oz (88.3 kg)   SpO2 98%   BMI 31.41 kg/m   Review of Systems  Constitutional: Positive for fatigue (Occasional).  Respiratory: Negative.   Cardiovascular: Negative.   Gastrointestinal: Positive for abdominal distention (Obese).  Genitourinary:  Negative.   Musculoskeletal: Negative.   Skin: Negative.   Neurological: Positive for dizziness (Occasional) and weakness (Mild).  Psychiatric/Behavioral: Negative.    Objective:   Physical Exam  Constitutional: She is oriented to person, place, and time. She appears well-developed and well-nourished.  Cardiovascular: Normal rate, regular rhythm, normal heart sounds and intact distal pulses.  Pulmonary/Chest: Effort normal and breath sounds normal.  Abdominal: Soft. Bowel sounds are normal.  Musculoskeletal: Normal range of motion.  Neurological: She is alert and oriented to person, place, and time.  Skin: Skin is warm and dry.  Psychiatric: She has a normal mood and affect. Her behavior is normal. Judgment and thought content normal.  Nursing note and vitals reviewed.  Assessment & Plan:   1. Essential hypertension Clonidine given to patient in office today, which was effective in decreasing blood pressure.  - cloNIDine (CATAPRES) tablet 0.2 mg - cloNIDine (CATAPRES) tablet 0.1 mg  2. Elevated blood pressure reading Blood pressures are improved today. She will continue Antihypertensive medications as prescribed. She will continue to decrease high sodium intake, excessive alcohol intake, increase potassium intake, smoking cessation, and increase physical activity of at least 30 minutes of cardio activity daily. She will continue to follow Heart Healthy or DASH diet.  3. Uncontrolled type 2 diabetes mellitus with hyperglycemia (HCC)/ Improved at 165 today, from 214 on 03/30/2018. She will continue Lantus as prescribed. She will continue to decrease foods/beverages high in sugars and carbs and follow Heart Healthy or DASH diet. Increase physical activity to at least 30 minutes cardio exercise daily.  - POCT urinalysis dipstick - POCT glucose (manual entry)  4. Dizziness Much improve. She will continue taking Antihypertensive medications later in the day as instructed by Cardiologist to  decrease dizziness. She is cautioned to ambulate slowly when changing positions.   5. Edema, peripheral Diuretic is effective. Continue as prescribe. Monitor.   6. Hypokalemia Potassium stable at 3.9 on 05/27/2018. She is on daily Lasix. Continue Potassium as prescribed. Monitor.   7. Follow up She will follow up in 7 weeks.   Meds  ordered this encounter  Medications  . cloNIDine (CATAPRES) tablet 0.2 mg  . cloNIDine (CATAPRES) tablet 0.1 mg   Kathe Becton,  MSN, FNP-C Patient [facility] [address] [place], [place] [address] [phone]

## 2018-06-06 MED FILL — HYDROCHLOROTHIAZIDE 25 MG T: 25 | 30 days supply | Qty: 30 | Fill #1

## 2018-06-07 ENCOUNTER — Ambulatory Visit (INDEPENDENT_AMBULATORY_CARE_PROVIDER_SITE_OTHER): Payer: BLUE CROSS/BLUE SHIELD | Admitting: Pharmacist

## 2018-06-07 DIAGNOSIS — I480 Paroxysmal atrial fibrillation: Secondary | ICD-10-CM

## 2018-06-07 DIAGNOSIS — Z5181 Encounter for therapeutic drug level monitoring: Secondary | ICD-10-CM

## 2018-06-07 LAB — POCT INR: INR: 2.1 (ref 2.0–3.0)

## 2018-06-08 MED FILL — LOSARTAN POTASSIUM 100 MG T: 100 | 30 days supply | Qty: 30 | Fill #1

## 2018-06-08 MED FILL — CARVEDILOL 25 MG TABLET: 25 | 30 days supply | Qty: 120 | Fill #1

## 2018-06-08 MED FILL — WARFARIN SODIUM 5 MG TABLET: 5 | 30 days supply | Qty: 34 | Fill #6

## 2018-06-22 NOTE — Progress Notes (Signed)
[facility] Note  06/23/2018     CHIEF COMPLAINT Patient presents for Retina Follow Up s/p 25g PPV/MP/EL/Gas OS for TRD and VH, 01/13/18  HISTORY OF PRESENT ILLNESS: Amber Young is a 62 y.o. female who presents to the clinic today for:   HPI    Retina Follow Up    Patient presents with  Diabetic Retinopathy.  In both eyes.  This started 3 months ago.  Severity is mild.  Since onset it is stable.  I, the attending physician,  performed the HPI with the patient and updated documentation appropriately.          Comments    F/U PDR w/ Mac edema OU. Patient states her vision is about the "same, no changes in my vision, Os comes more blurry as the day goes, I think it's my cataract". Bs 161," Bs have been up and down this week", denies hyper/hypoglycemic episodes.Pt ststaes she has been applying gtt's as instructed( RXunknown gtt's)       Last edited by Bernarda Caffey, MD on 06/23/2018 10:50 AM. (History)    pt states she feels like her vision is the same, she states she is using ketorolac OD as directed and PF OS, pt states her left eye is very sensitive to light, she states she has to close the blinds and curtains in her house  Referring physician: Azzie Glatter, [place], [place] [address]  HISTORICAL INFORMATION:   Selected notes from the [facility] Referred by Molli Barrows, FNP for DM exam;  LEE-  Ocular Hx-  PMH- DM (A1C - 9.4 (02.13.19)), CKD, HTN, CAD, CHF, hx of stroke    CURRENT MEDICATIONS: Current Outpatient Medications (Ophthalmic Drugs)  Medication Sig  . ketorolac (ACULAR) 0.5 % ophthalmic solution Place 1 drop into the right eye 4 (four) times daily.  . prednisoLONE acetate (PRED FORTE) 1 % ophthalmic suspension Place 1 drop into the left eye 4 (four) times daily.   Current Facility-Administered Medications (Ophthalmic Drugs)  Medication Route  . aflibercept (EYLEA) SOLN 2 mg Intravitreal  .  aflibercept (EYLEA) SOLN 2 mg Intravitreal  . aflibercept (EYLEA) SOLN 2 mg Intravitreal  . aflibercept (EYLEA) SOLN 2 mg Intravitreal   Current Outpatient Medications (Other)  Medication Sig  . amLODipine (NORVASC) 10 MG tablet Take 1 tablet (10 mg total) by mouth daily.  Marland Kitchen atorvastatin (LIPITOR) 40 MG tablet Take 1 tablet (40 mg total) by mouth daily at 6 PM.  . carvedilol (COREG) 25 MG tablet Take 2 tablets (50 mg total) by mouth 2 (two) times daily.  . ferrous sulfate (FERROUSUL) 325 (65 FE) MG tablet Take 1 tablet (325 mg total) by mouth every other day.  . furosemide (LASIX) 40 MG tablet Take 1 tablet (40 mg total) by mouth daily.  . Glucose Blood (BLOOD GLUCOSE TEST STRIPS) STRP Use as directed  . hydrALAZINE (APRESOLINE) 50 MG tablet Take 1.5 tablets (75 mg total) by mouth 3 (three) times daily. (Patient taking differently: Take 50 mg by mouth 3 (three) times daily. )  . hydrochlorothiazide (HYDRODIURIL) 25 MG tablet Take 1 tablet (25 mg total) by mouth daily.  . insulin glargine (LANTUS) 100 UNIT/ML injection Inject 0.2 mLs (20 Units total) into the skin 2 (two) times daily.  . Insulin Pen Needle 29G X 10MM MISC Use as directed  . INSULIN SYRINGE .5CC/29G 29G X 1/2" 0.5 ML MISC Use to administer insulin three times daily  .  losartan (COZAAR) 100 MG tablet Take 1 tablet (100 mg total) by mouth daily.  . nitroGLYCERIN (NITROSTAT) 0.4 MG SL tablet Place 1 tablet (0.4 mg total) under the tongue every 5 (five) minutes x 3 doses as needed for chest pain.  . potassium chloride (K-DUR,KLOR-CON) 10 MEQ tablet Take 1 tablet with each dose of furosemide, daily.  . ticagrelor (BRILINTA) 90 MG TABS tablet Take 1 tablet (90 mg total) by mouth 2 (two) times daily.  . TRUEPLUS PEN NEEDLES 31G X 6 MM MISC USE AS DIRECTED  . warfarin (COUMADIN) 5 MG tablet Take 1 to 1.5 tablets by mouth daily as directed by coumadin clinic   Current Facility-Administered Medications (Other)  Medication Route  .  Bevacizumab (AVASTIN) SOLN 1.25 mg Intravitreal  . Bevacizumab (AVASTIN) SOLN 1.25 mg Intravitreal  . Bevacizumab (AVASTIN) SOLN 1.25 mg Intravitreal  . Bevacizumab (AVASTIN) SOLN 1.25 mg Intravitreal  . Bevacizumab (AVASTIN) SOLN 1.25 mg Intravitreal      REVIEW OF SYSTEMS: ROS    Positive for: Endocrine, Eyes   Negative for: Constitutional, Gastrointestinal, Neurological, Skin, Genitourinary, Musculoskeletal, HENT, Cardiovascular, Respiratory, Psychiatric, Allergic/Imm, Heme/Lymph   Last edited by Zenovia Jordan, LPN on 17/40/8144  8:18 AM. (History)       ALLERGIES Allergies  Allergen Reactions  . Ace Inhibitors Cough    PAST MEDICAL HISTORY Past Medical History:  Diagnosis Date  . Anemia   . CHF (congestive heart failure) ([place])   . CKD (chronic kidney disease), stage III ([facility])   . Coronary artery disease   . GERD (gastroesophageal reflux disease)   . Heart murmur   . Hyperlipidemia   . Hypertension   . Proliferative diabetic retinopathy ([place])    left eye with vitreous hemorrhage and tractional retinal detachment  . Stroke ([place]) 09/2014   numbness left upper lip, finger tips on left hand; "resolved" (05/18/2016)  . Type II diabetes mellitus ([place])   . Wears glasses    Past Surgical History:  Procedure Laterality Date  . CARDIAC CATHETERIZATION N/A 05/18/2016   Procedure: Left Heart Cath and Coronary Angiography;  Surgeon: Jettie Booze, MD;  Location: [facility];  Service: Cardiovascular;  Laterality: N/A;  . CARDIAC CATHETERIZATION N/A 05/18/2016   Procedure: Coronary Stent Intervention;  Surgeon: Jettie Booze, MD;  Location: [facility];  Service: Cardiovascular;  Laterality: N/A;  . COLONOSCOPY W/ BIOPSIES AND POLYPECTOMY    . CORONARY ANGIOPLASTY    . CORONARY ANGIOPLASTY WITH STENT PLACEMENT    . CORONARY STENT INTERVENTION N/A 09/07/2017   Procedure: CORONARY STENT INTERVENTION;  Surgeon: Leonie Man, MD;  Location: [facility];  Service: Cardiovascular;  Laterality: N/A;  . EYE SURGERY    . GAS INSERTION Left 01/13/2018   Procedure: INSERTION OF GAS;  Surgeon: Bernarda Caffey, MD;  Location: [facility];  Service: Ophthalmology;  Laterality: Left;  . LEFT HEART CATH AND CORONARY ANGIOGRAPHY N/A 09/07/2017   Procedure: LEFT HEART CATH AND CORONARY ANGIOGRAPHY;  Surgeon: Leonie Man, MD;  Location: [facility];  Service: Cardiovascular;  Laterality: N/A;  . MEMBRANE PEEL Left 01/13/2018   Procedure: MEMBRANE PEEL LEFT EYE ;  Surgeon: Bernarda Caffey, MD;  Location: [facility];  Service: Ophthalmology;  Laterality: Left;  Marland Kitchen MULTIPLE TOOTH EXTRACTIONS    . PARS PLANA VITRECTOMY Left 01/13/2018   Procedure: PARS PLANA VITRECTOMY WITH 25 GAUGE LEFT EYE WITH ENDOLASER;  Surgeon: Bernarda Caffey, MD;  Location: [facility];  Service: Ophthalmology;  Laterality: Left;  . TUBAL LIGATION  1980  . VAGINAL HYSTERECTOMY  1999   "partial; fibroids"    FAMILY HISTORY Family History  Problem Relation Age of Onset  . Diabetes Mother   . Hypertension Mother   . COPD Mother   . Heart failure Mother   . Hypertension Sister   . Hypertension Brother     SOCIAL HISTORY Social History   Tobacco Use  . Smoking status: Never Smoker  . Smokeless tobacco: Never Used  Substance Use Topics  . Alcohol use: No    Alcohol/week: 0.0 standard drinks  . Drug use: No         OPHTHALMIC EXAM:  Base Eye Exam    Visual Acuity (Snellen - Linear)      Right Left   Dist [place] 20/100 +1 20/200 -1   Dist ph [place] 20/70 +2 20/40 -2   Correction:  Glasses       Tonometry (Tonopen, 9:30 AM)      Right Left   Pressure 18 18       Pupils      Dark Light Shape React APD   Right 4 3 Round Brisk None   Left 4 3 Round Brisk None       Visual Fields      Left Right    Full Full       Extraocular Movement      Right Left    Full, Ortho Full, Ortho       Neuro/Psych    Oriented x3:  Yes   Mood/Affect:  Normal       Dilation    Both  eyes:  1.0% Mydriacyl, 2.5% Phenylephrine @ 9:30 AM        Slit Lamp and Fundus Exam    Slit Lamp Exam      Right Left   Lids/Lashes Dermatochalasis - upper lid, mild Meibomian gland dysfunction Dermatochalasis - upper lid, Meibomian gland dysfunction   Conjunctiva/Sclera White and quiet White and quiet   Cornea Arcus, 1+ Punctate epithelial erosions, 1+ pigmented [place]ata, irregular tear film Arcus, 2+ Punctate epithelial erosions, EBMD, pigmented Guttata   Anterior Chamber Deep and quiet, 2+ pigment Deep, 2+ pigment   Iris Round and dilated, No NVI Round and dilated, No NVI, persistent pupillary membrane   Lens 2-3+ Nuclear sclerosis, 2+ Cortical cataract, Vacuoles, iridescent refractile opacities 3+ Nuclear sclerosis, 2+ Cortical cataract, Vacuoles, iridescent refractile opacities, 1-2+ Posterior subcapsular cataract   Vitreous Vitreous syneresis, mild Asteroid hyalosis superiorly Post vitrectomy, +pigment in anterior vitreous       Fundus Exam      Right Left   Disc compact, Pink and Sharp pink and Sharp   C/D Ratio 0.5 0.5   Macula Blunted foveal reflex, +central CME, scattered exudate and Microaneurysms,   Blunted foveal reflex, Microaneurysms, Exudates -- slightly improved, scattered cystic changes   Vessels Tortuous; +fine NV regressing Attenuated; sclerosis temporally   Periphery Attached, 360 DBH attached; 360 PRP in place; IRH superiorly, scattered dot hemorrhages          IMAGING AND PROCEDURES  Imaging and Procedures for 12/07/17  OCT, Retina - OU - Both Eyes       Right Eye Quality was good. Central Foveal Thickness: 503. Progression has been stable. Findings include abnormal foveal contour, intraretinal fluid, vitreomacular adhesion , no SRF, outer retinal atrophy (Persistent IRF).   Left Eye Quality was good. Central Foveal Thickness: 312. Progression has been stable. Findings include intraretinal  fluid, no SRF, normal foveal contour (Interval improvement in  IRF).   Notes *Images captured and stored on drive  Diagnosis / Impression:  Persistent DME OU  Clinical management:  See below  Abbreviations: NFP - Normal foveal profile. CME - cystoid macular edema. PED - pigment epithelial detachment. IRF - intraretinal fluid. SRF - subretinal fluid. EZ - ellipsoid zone. ERM - epiretinal membrane. ORA - outer retinal atrophy. ORT - outer retinal tubulation. SRHM - subretinal hyper-reflective material         Intravitreal Injection, Pharmacologic Agent - OD - Right Eye       Time Out 06/23/2018. 11:23 AM. Confirmed correct patient, procedure, site, and patient consented.   Anesthesia Topical anesthesia was used. Anesthetic medications included Lidocaine 2%, Proparacaine 0.5%.   Procedure Preparation included 5% betadine to ocular surface, eyelid speculum. A supplied needle was used.   Injection:  2 mg aflibercept 2 MG/0.05ML   NDC: 61755-005-02, Lot: [id number], Expiration date: 05/10/2019   Route: Intravitreal, Site: Right Eye, Waste: 0.05 mL  Post-op Post injection exam found visual acuity of at least counting fingers. The patient tolerated the procedure well. There were no complications. The patient received written and verbal post procedure care education.        Intravitreal Injection, Pharmacologic Agent - OS - Left Eye       Time Out 06/23/2018. 11:22 AM. Confirmed correct patient, procedure, site, and patient consented.   Anesthesia Topical anesthesia was used. Anesthetic medications included Lidocaine 2%, Proparacaine 0.5%.   Procedure Preparation included 5% betadine to ocular surface, eyelid speculum. A supplied needle was used.   Injection:  1.25 mg Bevacizumab 1.54m/0.05ml   NDC: 50242-060-01, Lot: [id number] , Expiration date: 07/21/2018   Route: Intravitreal, Site: Left Eye, Waste: 0 mg  Post-op Post injection exam found visual acuity of at least counting fingers. The patient tolerated the procedure well.  There were no complications. The patient received written and verbal post procedure care education.                 ASSESSMENT/PLAN:    ICD-10-CM   1. Proliferative diabetic retinopathy of both eyes with macular edema associated with type 2 diabetes mellitus (HCC) EE70.3500Intravitreal Injection, Pharmacologic Agent - OD - Right Eye    Intravitreal Injection, Pharmacologic Agent - OS - Left Eye    aflibercept (EYLEA) SOLN 2 mg    Bevacizumab (AVASTIN) SOLN 1.25 mg  2. Retinal edema H35.81 OCT, Retina - OU - Both Eyes  3. Vitreous hemorrhage of left eye (HCC) H43.12   4. 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 Essential hypertension I10   6. Hypertensive retinopathy of both eyes H35.033   7. Combined forms of age-related cataract of both eyes H25.813     1,2. Proliferative diabetic retinopathy with diabetic macular edema, OU - exam shows significant IRH, macular edema, fine NVE OU; preretinal and vitreous heme OS -- improving OU - FA from prior shows NVE OU - OCT confirms diabetic macular edema, OU - A1c 9.7% on 10.09.19 from 9.0% (6.17.19) - s/p IVA #1 OS (03.13.19), #2 (04.15.19), #3 (05.13.19), #4 (06.03.19), #5 (08.22.19), #6 (09.19.19), #7 (10.17.19) - s/p IVA #1 OD (03.15.19), #2 (04.15.19), #3 (05.13.19), #4 (07.08.19)  - S/P IVE #1 OD (08.22.19) -- sample, #2 (09.19.19), #3 (10.17.19)  - s/p PRP OS (04.05.19), PRP fill-in OS (04.30.19); OD will need PRP eventually - s/p 25g PPV/MP/EL/Gas OS under general anesthesia for VH and TRD repair OS (06.06.19)             -  doing well             - IOP okay today             - cont cosopt BID OS  - cont PF QID OU  - start ketorolac QID OD  - can use PSO ung prn OS - BCVA 20/70 OD, 20/40 OS today - OCT today shows persistent DME OU - recommend IVE OD #4 today (11.14.19), IVA OS #8 today (11.14.19) - RBA of procedures discussed, questions answered - informed consent obtained and signed - see procedure note - Eylea  paperwork and benefits investigation initiated on 08.22.19 -- approved for commercial copay assistance - F/U 4 wks, DFE, OCT, repeat FA (transit OD)  3. Vitreous hemorrhage OS - secondary to #1, above - s/p IVA OS as above - s/p PRP OS (04.05.19 and 04.30.19) - of note pt on warfarin  - s/p PPV w/ laser as above  4. 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 Hypertensive retinopathy OU - discussed importance of tight BP control - monitor  7. 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 - discussed diagnosis and progression - approaching visual significance - discussed likelihood of cataract progression OS following PPV - monitor for now   Ophthalmic Meds Ordered this visit:  Meds ordered this encounter  Medications  . aflibercept (EYLEA) SOLN 2 mg  . Bevacizumab (AVASTIN) SOLN 1.25 mg       Return in about 4 weeks (around 07/21/2018) for F/U PDR OU, DFE, OCT, FA (transit OD).  There are no Patient Instructions on file for this visit.   Explained the diagnoses, plan, and follow up with the patient and they expressed understanding.  Patient expressed understanding of the importance of proper follow up care.   This document serves as a record of services personally performed by Gardiner Sleeper, MD, PhD. It was created on their behalf by Ernest Mallick, OA, an ophthalmic assistant. The creation of this record is the provider's dictation and/or activities during the visit.    Electronically signed by: Ernest Mallick, OA  11.13.19 12:55 AM    Gardiner Sleeper, M.D., Ph.D. Diseases & Surgery of the Retina and Vitreous [facility]   I have reviewed the above documentation for accuracy and completeness, and I agree with the above. Gardiner Sleeper, M.D., Ph.D. 06/27/18 12:55  AM    Abbreviations: M myopia (nearsighted); A astigmatism; H hyperopia (farsighted); P presbyopia; Mrx spectacle prescription;  CTL contact lenses; OD right eye; OS left eye; OU both eyes  XT exotropia; ET esotropia; PEK punctate epithelial keratitis; PEE punctate epithelial erosions; DES dry eye syndrome; MGD meibomian gland dysfunction; ATs artificial tears; PFAT's preservative free artificial tears; [place] nuclear sclerotic cataract; PSC posterior subcapsular cataract; ERM epi-retinal membrane; PVD posterior vitreous detachment; RD retinal detachment; DM diabetes mellitus; DR diabetic retinopathy; NPDR non-proliferative diabetic retinopathy; PDR proliferative diabetic retinopathy; CSME clinically significant macular edema; DME diabetic macular edema; dbh dot blot hemorrhages; CWS cotton wool spot; POAG primary open angle glaucoma; C/D cup-to-disc ratio; HVF humphrey visual field; GVF goldmann visual field; OCT optical coherence tomography; IOP intraocular pressure; BRVO Branch retinal vein occlusion; CRVO central retinal vein occlusion; CRAO central retinal artery occlusion; BRAO branch retinal artery occlusion; RT retinal tear; SB scleral buckle; PPV pars plana vitrectomy; VH Vitreous hemorrhage;  PRP panretinal laser photocoagulation; IVK intravitreal kenalog; VMT vitreomacular traction; MH Macular hole;  NVD neovascularization of the disc; NVE neovascularization elsewhere; AREDS age related eye disease study; ARMD age related macular degeneration; POAG primary open angle glaucoma; EBMD epithelial/anterior basement membrane dystrophy; ACIOL anterior chamber intraocular lens; IOL intraocular lens; PCIOL posterior chamber intraocular lens; Phaco/IOL phacoemulsification with intraocular lens placement; [place] photorefractive keratectomy; LASIK laser assisted in situ keratomileusis; HTN hypertension; DM diabetes mellitus; COPD chronic obstructive pulmonary disease

## 2018-06-23 ENCOUNTER — Encounter (INDEPENDENT_AMBULATORY_CARE_PROVIDER_SITE_OTHER): Payer: Self-pay | Admitting: Ophthalmology

## 2018-06-23 ENCOUNTER — Ambulatory Visit (INDEPENDENT_AMBULATORY_CARE_PROVIDER_SITE_OTHER): Payer: BLUE CROSS/BLUE SHIELD | Admitting: Ophthalmology

## 2018-06-23 DIAGNOSIS — H3581 Retinal edema: Secondary | ICD-10-CM

## 2018-06-23 DIAGNOSIS — E113513 Type 2 diabetes mellitus with proliferative diabetic retinopathy with macular edema, bilateral: Secondary | ICD-10-CM

## 2018-06-23 DIAGNOSIS — H25813 Combined forms of age-related cataract, bilateral: Secondary | ICD-10-CM

## 2018-06-23 DIAGNOSIS — H3342 Traction detachment of retina, left eye: Secondary | ICD-10-CM | POA: Diagnosis not present

## 2018-06-23 DIAGNOSIS — I10 Essential (primary) hypertension: Secondary | ICD-10-CM

## 2018-06-23 DIAGNOSIS — H35033 Hypertensive retinopathy, bilateral: Secondary | ICD-10-CM

## 2018-06-23 DIAGNOSIS — H4312 Vitreous hemorrhage, left eye: Secondary | ICD-10-CM | POA: Diagnosis not present

## 2018-06-27 ENCOUNTER — Encounter (INDEPENDENT_AMBULATORY_CARE_PROVIDER_SITE_OTHER): Payer: Self-pay | Admitting: Ophthalmology

## 2018-06-27 DIAGNOSIS — E113513 Type 2 diabetes mellitus with proliferative diabetic retinopathy with macular edema, bilateral: Secondary | ICD-10-CM | POA: Diagnosis not present

## 2018-06-27 MED ORDER — BEVACIZUMAB CHEMO INJECTION 1.25MG/0.05ML SYRINGE FOR KALEIDOSCOPE
1.2500 mg | INTRAVITREAL | Status: DC
  Administered 2018-06-27: 1.25 mg via INTRAVITREAL

## 2018-06-27 MED ORDER — AFLIBERCEPT 2MG/0.05ML IZ SOLN FOR KALEIDOSCOPE
2.0000 mg | INTRAVITREAL | Status: DC
  Administered 2018-06-27: 2 mg via INTRAVITREAL

## 2018-07-01 ENCOUNTER — Ambulatory Visit: Payer: BLUE CROSS/BLUE SHIELD | Admitting: Student

## 2018-07-04 ENCOUNTER — Ambulatory Visit (INDEPENDENT_AMBULATORY_CARE_PROVIDER_SITE_OTHER): Payer: BLUE CROSS/BLUE SHIELD | Admitting: Pharmacist

## 2018-07-04 DIAGNOSIS — Z5181 Encounter for therapeutic drug level monitoring: Secondary | ICD-10-CM | POA: Diagnosis not present

## 2018-07-04 DIAGNOSIS — I480 Paroxysmal atrial fibrillation: Secondary | ICD-10-CM | POA: Diagnosis not present

## 2018-07-04 LAB — POCT INR: INR: 3.8 — AB (ref 2.0–3.0)

## 2018-07-20 ENCOUNTER — Inpatient Hospital Stay (HOSPITAL_COMMUNITY)
Admission: EM | Admit: 2018-07-20 | Discharge: 2018-07-23 | DRG: 308 | Disposition: A | Discharge status: Home Health | Payer: BLUE CROSS/BLUE SHIELD | Attending: Internal Medicine | Admitting: Internal Medicine

## 2018-07-20 ENCOUNTER — Emergency Department (HOSPITAL_COMMUNITY): Payer: BLUE CROSS/BLUE SHIELD

## 2018-07-20 ENCOUNTER — Other Ambulatory Visit: Payer: Self-pay

## 2018-07-20 ENCOUNTER — Encounter: Payer: Self-pay | Admitting: Family Medicine

## 2018-07-20 ENCOUNTER — Encounter (HOSPITAL_COMMUNITY): Payer: Self-pay | Admitting: Student

## 2018-07-20 ENCOUNTER — Ambulatory Visit (INDEPENDENT_AMBULATORY_CARE_PROVIDER_SITE_OTHER): Payer: BLUE CROSS/BLUE SHIELD | Admitting: Family Medicine

## 2018-07-20 VITALS — BP 180/110 | HR 88 | Temp 97.9°F | Ht 66.0 in | Wt 198.3 lb

## 2018-07-20 DIAGNOSIS — K529 Noninfective gastroenteritis and colitis, unspecified: Secondary | ICD-10-CM | POA: Diagnosis present

## 2018-07-20 DIAGNOSIS — B974 Respiratory syncytial virus as the cause of diseases classified elsewhere: Secondary | ICD-10-CM | POA: Diagnosis present

## 2018-07-20 DIAGNOSIS — I4891 Unspecified atrial fibrillation: Secondary | ICD-10-CM | POA: Diagnosis not present

## 2018-07-20 DIAGNOSIS — D649 Anemia, unspecified: Secondary | ICD-10-CM | POA: Diagnosis not present

## 2018-07-20 DIAGNOSIS — Z833 Family history of diabetes mellitus: Secondary | ICD-10-CM | POA: Diagnosis not present

## 2018-07-20 DIAGNOSIS — I10 Essential (primary) hypertension: Secondary | ICD-10-CM

## 2018-07-20 DIAGNOSIS — E1165 Type 2 diabetes mellitus with hyperglycemia: Secondary | ICD-10-CM | POA: Diagnosis not present

## 2018-07-20 DIAGNOSIS — R42 Dizziness and giddiness: Secondary | ICD-10-CM

## 2018-07-20 DIAGNOSIS — Z7901 Long term (current) use of anticoagulants: Secondary | ICD-10-CM | POA: Diagnosis not present

## 2018-07-20 DIAGNOSIS — N179 Acute kidney failure, unspecified: Secondary | ICD-10-CM | POA: Diagnosis present

## 2018-07-20 DIAGNOSIS — Z7902 Long term (current) use of antithrombotics/antiplatelets: Secondary | ICD-10-CM

## 2018-07-20 DIAGNOSIS — I5040 Unspecified combined systolic (congestive) and diastolic (congestive) heart failure: Secondary | ICD-10-CM | POA: Diagnosis not present

## 2018-07-20 DIAGNOSIS — Z825 Family history of asthma and other chronic lower respiratory diseases: Secondary | ICD-10-CM | POA: Diagnosis not present

## 2018-07-20 DIAGNOSIS — J121 Respiratory syncytial virus pneumonia: Secondary | ICD-10-CM | POA: Diagnosis not present

## 2018-07-20 DIAGNOSIS — I255 Ischemic cardiomyopathy: Secondary | ICD-10-CM | POA: Diagnosis not present

## 2018-07-20 DIAGNOSIS — R791 Abnormal coagulation profile: Secondary | ICD-10-CM | POA: Diagnosis not present

## 2018-07-20 DIAGNOSIS — E785 Hyperlipidemia, unspecified: Secondary | ICD-10-CM | POA: Diagnosis not present

## 2018-07-20 DIAGNOSIS — Z79899 Other long term (current) drug therapy: Secondary | ICD-10-CM | POA: Diagnosis not present

## 2018-07-20 DIAGNOSIS — J189 Pneumonia, unspecified organism: Secondary | ICD-10-CM

## 2018-07-20 DIAGNOSIS — I499 Cardiac arrhythmia, unspecified: Secondary | ICD-10-CM | POA: Diagnosis not present

## 2018-07-20 DIAGNOSIS — Z955 Presence of coronary angioplasty implant and graft: Secondary | ICD-10-CM

## 2018-07-20 DIAGNOSIS — J181 Lobar pneumonia, unspecified organism: Secondary | ICD-10-CM

## 2018-07-20 DIAGNOSIS — Z888 Allergy status to other drugs, medicaments and biological substances status: Secondary | ICD-10-CM

## 2018-07-20 DIAGNOSIS — I5023 Acute on chronic systolic (congestive) heart failure: Secondary | ICD-10-CM | POA: Diagnosis not present

## 2018-07-20 DIAGNOSIS — I5021 Acute systolic (congestive) heart failure: Secondary | ICD-10-CM | POA: Diagnosis not present

## 2018-07-20 DIAGNOSIS — E861 Hypovolemia: Secondary | ICD-10-CM | POA: Diagnosis not present

## 2018-07-20 DIAGNOSIS — Z9114 Patient's other noncompliance with medication regimen: Secondary | ICD-10-CM | POA: Diagnosis not present

## 2018-07-20 DIAGNOSIS — I480 Paroxysmal atrial fibrillation: Principal | ICD-10-CM | POA: Diagnosis present

## 2018-07-20 DIAGNOSIS — J129 Viral pneumonia, unspecified: Secondary | ICD-10-CM | POA: Diagnosis not present

## 2018-07-20 DIAGNOSIS — D631 Anemia in chronic kidney disease: Secondary | ICD-10-CM | POA: Diagnosis not present

## 2018-07-20 DIAGNOSIS — I2510 Atherosclerotic heart disease of native coronary artery without angina pectoris: Secondary | ICD-10-CM

## 2018-07-20 DIAGNOSIS — D509 Iron deficiency anemia, unspecified: Secondary | ICD-10-CM

## 2018-07-20 DIAGNOSIS — R0789 Other chest pain: Secondary | ICD-10-CM | POA: Diagnosis not present

## 2018-07-20 DIAGNOSIS — I361 Nonrheumatic tricuspid (valve) insufficiency: Secondary | ICD-10-CM | POA: Diagnosis not present

## 2018-07-20 DIAGNOSIS — R030 Elevated blood-pressure reading, without diagnosis of hypertension: Secondary | ICD-10-CM | POA: Diagnosis not present

## 2018-07-20 DIAGNOSIS — I130 Hypertensive heart and chronic kidney disease with heart failure and stage 1 through stage 4 chronic kidney disease, or unspecified chronic kidney disease: Secondary | ICD-10-CM

## 2018-07-20 DIAGNOSIS — I340 Nonrheumatic mitral (valve) insufficiency: Secondary | ICD-10-CM | POA: Diagnosis not present

## 2018-07-20 DIAGNOSIS — I959 Hypotension, unspecified: Secondary | ICD-10-CM | POA: Diagnosis not present

## 2018-07-20 DIAGNOSIS — K219 Gastro-esophageal reflux disease without esophagitis: Secondary | ICD-10-CM | POA: Diagnosis not present

## 2018-07-20 DIAGNOSIS — Z8673 Personal history of transient ischemic attack (TIA), and cerebral infarction without residual deficits: Secondary | ICD-10-CM | POA: Diagnosis not present

## 2018-07-20 DIAGNOSIS — Z8249 Family history of ischemic heart disease and other diseases of the circulatory system: Secondary | ICD-10-CM

## 2018-07-20 DIAGNOSIS — N183 Chronic kidney disease, stage 3 (moderate): Secondary | ICD-10-CM | POA: Diagnosis not present

## 2018-07-20 DIAGNOSIS — E1122 Type 2 diabetes mellitus with diabetic chronic kidney disease: Secondary | ICD-10-CM

## 2018-07-20 DIAGNOSIS — Z794 Long term (current) use of insulin: Secondary | ICD-10-CM

## 2018-07-20 DIAGNOSIS — R350 Frequency of micturition: Secondary | ICD-10-CM

## 2018-07-20 DIAGNOSIS — Z90711 Acquired absence of uterus with remaining cervical stump: Secondary | ICD-10-CM

## 2018-07-20 DIAGNOSIS — Z09 Encounter for follow-up examination after completed treatment for conditions other than malignant neoplasm: Secondary | ICD-10-CM

## 2018-07-20 DIAGNOSIS — I2721 Secondary pulmonary arterial hypertension: Secondary | ICD-10-CM

## 2018-07-20 DIAGNOSIS — R079 Chest pain, unspecified: Secondary | ICD-10-CM | POA: Diagnosis not present

## 2018-07-20 DIAGNOSIS — Z973 Presence of spectacles and contact lenses: Secondary | ICD-10-CM

## 2018-07-20 DIAGNOSIS — I272 Pulmonary hypertension, unspecified: Secondary | ICD-10-CM | POA: Diagnosis present

## 2018-07-20 LAB — BASIC METABOLIC PANEL
Anion gap: 15 (ref 5–15)
BUN: 36 mg/dL — ABNORMAL HIGH (ref 8–23)
CO2: 20 mmol/L — AB (ref 22–32)
Calcium: 7.9 mg/dL — ABNORMAL LOW (ref 8.9–10.3)
Chloride: 101 mmol/L (ref 98–111)
Creatinine, Ser: 2.3 mg/dL — ABNORMAL HIGH (ref 0.44–1.00)
GFR calc non Af Amer: 22 mL/min — ABNORMAL LOW (ref >60)
GFR, EST AFRICAN AMERICAN: 26 mL/min — AB (ref >60)
Glucose, Bld: 137 mg/dL — ABNORMAL HIGH (ref 70–99)
Potassium: 3.5 mmol/L (ref 3.5–5.1)
Sodium: 136 mmol/L (ref 135–145)

## 2018-07-20 LAB — CBC
HCT: 37 % (ref 36.0–46.0)
Hemoglobin: 11.1 g/dL — ABNORMAL LOW (ref 12.0–15.0)
MCH: 22.1 pg — ABNORMAL LOW (ref 26.0–34.0)
MCHC: 30 g/dL (ref 30.0–36.0)
MCV: 73.6 fL — ABNORMAL LOW (ref 80.0–100.0)
NRBC: 0 % (ref 0.0–0.2)
Platelets: 284 10*3/uL (ref 150–400)
RBC: 5.03 MIL/uL (ref 3.87–5.11)
RDW: 17.2 % — AB (ref 11.5–15.5)
WBC: 5.2 10*3/uL (ref 4.0–10.5)

## 2018-07-20 LAB — GLUCOSE, POCT (MANUAL RESULT ENTRY): POC Glucose: 124 mg/dl — AB (ref 70–99)

## 2018-07-20 LAB — POCT GLYCOSYLATED HEMOGLOBIN (HGB A1C): Hemoglobin A1C: 9.3 % — AB (ref 4.0–5.6)

## 2018-07-20 LAB — GLUCOSE, CAPILLARY
GLUCOSE-CAPILLARY: 142 mg/dL — AB (ref 70–99)
Glucose-Capillary: 148 mg/dL — ABNORMAL HIGH (ref 70–99)

## 2018-07-20 LAB — PROTIME-INR
INR: 1.31
Prothrombin Time: 16.2 seconds — ABNORMAL HIGH (ref 11.4–15.2)

## 2018-07-20 LAB — BRAIN NATRIURETIC PEPTIDE: B Natriuretic Peptide: 918.5 pg/mL — ABNORMAL HIGH (ref 0.0–100.0)

## 2018-07-20 MED ORDER — LOSARTAN POTASSIUM 50 MG PO TABS
100.0000 mg | ORAL_TABLET | Freq: Every day | ORAL | Status: DC
  Filled 2018-07-20: qty 2

## 2018-07-20 MED ORDER — ATORVASTATIN CALCIUM 40 MG PO TABS
40.0000 mg | ORAL_TABLET | Freq: Every day | ORAL | Status: DC
  Administered 2018-07-20 – 2018-07-22 (×3): 40 mg via ORAL
  Filled 2018-07-20 (×3): qty 1

## 2018-07-20 MED ORDER — FERROUS SULFATE 325 (65 FE) MG PO TABS
325.0000 mg | ORAL_TABLET | ORAL | Status: DC
  Administered 2018-07-21 – 2018-07-23 (×3): 325 mg via ORAL
  Filled 2018-07-20 (×3): qty 1

## 2018-07-20 MED ORDER — INSULIN GLARGINE 100 UNIT/ML ~~LOC~~ SOLN
12.0000 [IU] | Freq: Every day | SUBCUTANEOUS | Status: DC
  Administered 2018-07-20 – 2018-07-23 (×4): 12 [IU] via SUBCUTANEOUS
  Filled 2018-07-20 (×4): qty 0.12

## 2018-07-20 MED ORDER — ACETAMINOPHEN 650 MG RE SUPP: 650.0000 mg | Freq: Four times a day (QID) | RECTAL | Status: DC | PRN

## 2018-07-20 MED ORDER — SENNOSIDES-DOCUSATE SODIUM 8.6-50 MG PO TABS: 1.0000 | ORAL_TABLET | Freq: Every evening | ORAL | Status: DC | PRN

## 2018-07-20 MED ORDER — SODIUM CHLORIDE 0.9 % IV SOLN
500.0000 mg | Freq: Once | INTRAVENOUS | Status: AC
  Administered 2018-07-20: 500 mg via INTRAVENOUS
  Filled 2018-07-20: qty 500

## 2018-07-20 MED ORDER — SODIUM CHLORIDE 0.9 % IV SOLN
1.0000 g | Freq: Once | INTRAVENOUS | Status: AC
  Administered 2018-07-20: 1 g via INTRAVENOUS
  Filled 2018-07-20: qty 10

## 2018-07-20 MED ORDER — AMLODIPINE BESYLATE 10 MG PO TABS
10.0000 mg | ORAL_TABLET | Freq: Every day | ORAL | Status: DC
  Administered 2018-07-20 – 2018-07-23 (×3): 10 mg via ORAL
  Filled 2018-07-20: qty 1
  Filled 2018-07-20: qty 2
  Filled 2018-07-20 (×2): qty 1

## 2018-07-20 MED ORDER — DILTIAZEM HCL 25 MG/5ML IV SOLN
15.0000 mg | Freq: Once | INTRAVENOUS | Status: AC
  Administered 2018-07-20: 15 mg via INTRAVENOUS
  Filled 2018-07-20: qty 5

## 2018-07-20 MED ORDER — SODIUM CHLORIDE 0.9 % IV SOLN
INTRAVENOUS | Status: AC
  Administered 2018-07-20: 15:00:00 via INTRAVENOUS

## 2018-07-20 MED ORDER — CARVEDILOL 25 MG PO TABS
50.0000 mg | ORAL_TABLET | Freq: Two times a day (BID) | ORAL | Status: DC
  Administered 2018-07-20: 50 mg via ORAL
  Filled 2018-07-20 (×2): qty 2

## 2018-07-20 MED ORDER — HYDRALAZINE HCL 50 MG PO TABS
50.0000 mg | ORAL_TABLET | Freq: Three times a day (TID) | ORAL | Status: DC
  Administered 2018-07-20: 50 mg via ORAL
  Filled 2018-07-20: qty 2

## 2018-07-20 MED ORDER — FUROSEMIDE 40 MG PO TABS
40.0000 mg | ORAL_TABLET | Freq: Every day | ORAL | Status: DC
  Filled 2018-07-20: qty 2
  Filled 2018-07-20: qty 1

## 2018-07-20 MED ORDER — ACETAMINOPHEN 325 MG PO TABS: 650.0000 mg | ORAL_TABLET | Freq: Four times a day (QID) | ORAL | Status: DC | PRN

## 2018-07-20 MED ORDER — ONDANSETRON HCL 4 MG/2ML IJ SOLN
4.0000 mg | Freq: Four times a day (QID) | INTRAMUSCULAR | Status: DC | PRN
  Administered 2018-07-20: 4 mg via INTRAVENOUS
  Filled 2018-07-20: qty 2

## 2018-07-20 MED ORDER — SODIUM CHLORIDE 0.9 % IV BOLUS
500.0000 mL | Freq: Once | INTRAVENOUS | Status: AC
  Administered 2018-07-20: 500 mL via INTRAVENOUS

## 2018-07-20 MED ORDER — WARFARIN - PHARMACIST DOSING INPATIENT
Freq: Every day | Status: DC
  Administered 2018-07-20 – 2018-07-21 (×2)

## 2018-07-20 MED ORDER — ENOXAPARIN SODIUM 30 MG/0.3ML ~~LOC~~ SOLN
30.0000 mg | SUBCUTANEOUS | Status: DC
  Administered 2018-07-20: 30 mg via SUBCUTANEOUS
  Filled 2018-07-20 (×2): qty 0.3

## 2018-07-20 MED ORDER — WARFARIN SODIUM 7.5 MG PO TABS
7.5000 mg | ORAL_TABLET | Freq: Once | ORAL | Status: AC
  Administered 2018-07-20: 7.5 mg via ORAL
  Filled 2018-07-20 (×2): qty 1

## 2018-07-20 MED ORDER — PROMETHAZINE HCL 25 MG PO TABS
12.5000 mg | ORAL_TABLET | Freq: Four times a day (QID) | ORAL | Status: DC | PRN
  Filled 2018-07-20: qty 1

## 2018-07-20 MED ORDER — INSULIN ASPART 100 UNIT/ML ~~LOC~~ SOLN
0.0000 [IU] | Freq: Three times a day (TID) | SUBCUTANEOUS | Status: DC
  Administered 2018-07-20: 2 [IU] via SUBCUTANEOUS
  Administered 2018-07-21: 3 [IU] via SUBCUTANEOUS
  Administered 2018-07-22: 2 [IU] via SUBCUTANEOUS
  Administered 2018-07-23: 3 [IU] via SUBCUTANEOUS

## 2018-07-20 MED ORDER — TICAGRELOR 90 MG PO TABS
90.0000 mg | ORAL_TABLET | Freq: Two times a day (BID) | ORAL | Status: DC
  Administered 2018-07-20 – 2018-07-23 (×6): 90 mg via ORAL
  Filled 2018-07-20 (×6): qty 1

## 2018-07-20 MED ORDER — PROMETHAZINE HCL 25 MG/ML IJ SOLN: 12.5000 mg | Freq: Four times a day (QID) | INTRAMUSCULAR | Status: DC | PRN

## 2018-07-20 NOTE — ED Notes (Signed)
Dinner tray ordered-heart healthy 

## 2018-07-20 NOTE — ED Provider Notes (Signed)
[facility] EMERGENCY DEPARTMENT Provider Note   CSN: [id number] Arrival date & time: 07/20/18  1133     History   Chief Complaint Dyspnea   HPI Amber Young is a 62 y.o. female with a hx of T2DM, prior stroke, CKD, CHF (last EF 40-45%), CAD s/p PCI w/ stents to LAD/RCA, HTN, hyperlipidemia, and PAF anticoagulated on warfarin who presents to the ED via EMS from PCP office.   Patient's chief complaint to me is dyspnea. She states she has had URI/flu like sxs for the past 1 week. Relays congestion, rhinorrhea, productive cough with yellow/green mucous sputum, shortness of breath, and chest pain only w/ coughing, otherwise no chest pain. Has had a few episodes of vomiting/loose stools, but able to tolerate PO and each are non bloody. She states her sxs are constant without specific alleviating/aggravating factors. She has not tried intervention at home. She does have hx of HF and reports no significant PND/orthopnea to me. She states her legs might be somewhat more swollen than baseline. Denies fever, chills, sore throat, ear pain, syncope, dizziness, numbness, weakness, or change in vision (baseline vision issues unchanged). She states that she has not had any recent abx, hospitalizations, or foreign travel.   She was seen by PCP today and sent to the ED for HTN per EMS report to triage nursing team. Patient states to me her blood pressure runs high and that this is not significant different. She is unable to tell me what her BP typically runs. EMS report to triage team that patient found to be in afib w/ spontaneous conversion to sinus tach en route, but reverted back to afib with RVR 140s-160s PTA. Patient states she has never been cardioverted. She has been taking her warfarin as prescribed.   Patient's primary cardiologist is Dr. Bronson Ing.  Last cardiology note reviewed from 05/20/18: Patient w/ DES x2 RCA 09/07/2017 w/ med rx for 40% LAD, prev LAD stent ok, EF 40-45%  by echo 08/2017 w/ grade 3 dd, S-D-CHF Last Echo: 09/08/17 Last cath 09/07/17  HPI  Past Medical History:  Diagnosis Date  . Anemia   . CHF (congestive heart failure) ([place])   . CKD (chronic kidney disease), stage III ([place])   . Coronary artery disease   . GERD (gastroesophageal reflux disease)   . Heart murmur   . Hyperlipidemia   . Hypertension   . Proliferative diabetic retinopathy ([place])    left eye with vitreous hemorrhage and tractional retinal detachment  . Stroke ([place]) 09/2014   numbness left upper lip, finger tips on left hand; "resolved" (05/18/2016)  . Type II diabetes mellitus ([place])   . Wears glasses     Patient Active Problem List   Diagnosis Date Noted  . CKD (chronic kidney disease), stage III ([place]) 09/07/2017  . Hypertensive urgency 09/07/2017  . Essential hypertension   . Unstable angina ([place])   . Coronary artery disease involving native coronary artery of native heart with unstable angina pectoris ([place])   . Chronic anticoagulation 01/06/2017  . Breast abscess 12/20/2016  . Encounter for therapeutic drug monitoring 05/29/2016  . CAD S/P PCI mLAD DES (Promus 3.0 x 24 -- 3.25 mm) 05/19/2016  . PAF (paroxysmal atrial fibrillation) ([place]) 05/19/2016  . Cardiomyopathy, ischemic     Class: Diagnosis of  . Microcytic anemia 04/26/2016  . Acute on chronic combined systolic (congestive) and diastolic (congestive) heart failure ([place]) 11/14/2015  . Hypokalemia 11/14/2015  . Hypertensive emergency   . Uncontrolled hypertension 09/30/2014  .  Uncontrolled diabetes mellitus ([place]) 09/30/2014  . History of stroke 09/30/2014    Past Surgical History:  Procedure Laterality Date  . CARDIAC CATHETERIZATION N/A 05/18/2016   Procedure: Left Heart Cath and Coronary Angiography;  Surgeon: Jettie Booze, MD;  Location: [facility];  Service: Cardiovascular;  Laterality: N/A;  . CARDIAC CATHETERIZATION N/A 05/18/2016   Procedure: Coronary Stent Intervention;  Surgeon:  Jettie Booze, MD;  Location: [facility];  Service: Cardiovascular;  Laterality: N/A;  . COLONOSCOPY W/ BIOPSIES AND POLYPECTOMY    . CORONARY ANGIOPLASTY    . CORONARY ANGIOPLASTY WITH STENT PLACEMENT    . CORONARY STENT INTERVENTION N/A 09/07/2017   Procedure: CORONARY STENT INTERVENTION;  Surgeon: Leonie Man, MD;  Location: [facility];  Service: Cardiovascular;  Laterality: N/A;  . EYE SURGERY    . GAS INSERTION Left 01/13/2018   Procedure: INSERTION OF GAS;  Surgeon: Bernarda Caffey, MD;  Location: [facility];  Service: Ophthalmology;  Laterality: Left;  . LEFT HEART CATH AND CORONARY ANGIOGRAPHY N/A 09/07/2017   Procedure: LEFT HEART CATH AND CORONARY ANGIOGRAPHY;  Surgeon: Leonie Man, MD;  Location: [facility];  Service: Cardiovascular;  Laterality: N/A;  . MEMBRANE PEEL Left 01/13/2018   Procedure: MEMBRANE PEEL LEFT EYE ;  Surgeon: Bernarda Caffey, MD;  Location: [facility];  Service: Ophthalmology;  Laterality: Left;  [place] Kitchen MULTIPLE TOOTH EXTRACTIONS    . PARS PLANA VITRECTOMY Left 01/13/2018   Procedure: PARS PLANA VITRECTOMY WITH 25 GAUGE LEFT EYE WITH ENDOLASER;  Surgeon: Bernarda Caffey, MD;  Location: [facility];  Service: Ophthalmology;  Laterality: Left;  . TUBAL LIGATION  1980  . VAGINAL HYSTERECTOMY  1999   "partial; fibroids"     OB History    Gravida  2   Para      Term      Preterm      AB      Living  2     SAB      TAB      Ectopic      Multiple      Live Births               Home Medications    Prior to Admission medications   Medication Sig Start Date End Date Taking? Authorizing Provider  amLODipine (NORVASC) 10 MG tablet Take 1 tablet (10 mg total) by mouth daily. 04/26/17   Scot Jun, FNP  atorvastatin (LIPITOR) 40 MG tablet Take 1 tablet (40 mg total) by mouth daily at 6 PM. 04/26/17   Scot Jun, FNP  carvedilol (COREG) 25 MG tablet Take 2 tablets (50 mg total) by mouth 2 (two) times daily. 12/15/17   Dorena Dew, FNP  ferrous sulfate (FERROUSUL) 325 (65 FE) MG tablet Take 1 tablet (325 mg total) by mouth every other day. 11/22/17   Herminio Commons, MD  furosemide (LASIX) 40 MG tablet Take 1 tablet (40 mg total) by mouth daily. 04/18/18 07/17/18  Azzie Glatter, FNP  Glucose Blood (BLOOD GLUCOSE TEST STRIPS) STRP Use as directed 02/11/17   Hosie Poisson, MD  hydrALAZINE (APRESOLINE) 50 MG tablet Take 1.5 tablets (75 mg total) by mouth 3 (three) times daily. Patient taking differently: Take 50 mg by mouth 3 (three) times daily.  11/22/17   Herminio Commons, MD  hydrochlorothiazide (HYDRODIURIL) 25 MG tablet Take 1 tablet (25 mg total) by mouth daily. 04/18/18   Azzie Glatter,  FNP  insulin glargine (LANTUS) 100 UNIT/ML injection Inject 0.2 mLs (20 Units total) into the skin 2 (two) times daily. 03/30/18   Azzie Glatter, FNP  Insulin Pen Needle 29G X 10MM MISC Use as directed 09/22/17   Scot Jun, FNP  INSULIN SYRINGE .5CC/29G 29G X 1/2" 0.5 ML MISC Use to administer insulin three times daily 09/22/17   Scot Jun, FNP  ketorolac (ACULAR) 0.5 % ophthalmic solution Place 1 drop into the right eye 4 (four) times daily. 05/26/18 05/26/19  Bernarda Caffey, MD  losartan (COZAAR) 100 MG tablet Take 1 tablet (100 mg total) by mouth daily. 02/23/18   Azzie Glatter, FNP  nitroGLYCERIN (NITROSTAT) 0.4 MG SL tablet Place 1 tablet (0.4 mg total) under the tongue every 5 (five) minutes x 3 doses as needed for chest pain. 09/08/17   Debbe Odea, MD  potassium chloride (K-DUR,KLOR-CON) 10 MEQ tablet Take 1 tablet with each dose of furosemide, daily. 05/18/18   Azzie Glatter, FNP  prednisoLONE acetate (PRED FORTE) 1 % ophthalmic suspension Place 1 drop into the left eye 4 (four) times daily. 02/14/18   Bernarda Caffey, MD  ticagrelor (BRILINTA) 90 MG TABS tablet Take 1 tablet (90 mg total) by mouth 2 (two) times daily. 07/15/17   Tresa Garter, MD  TRUEPLUS PEN NEEDLES 31G X 6 MM MISC  USE AS DIRECTED 06/02/18   Azzie Glatter, FNP  warfarin (COUMADIN) 5 MG tablet Take 1 to 1.5 tablets by mouth daily as directed by coumadin clinic 06/17/17   Herminio Commons, MD    Family History Family History  Problem Relation Age of Onset  . Diabetes Mother   . Hypertension Mother   . COPD Mother   . Heart failure Mother   . Hypertension Sister   . Hypertension Brother     Social History Social History   Tobacco Use  . Smoking status: Never Smoker  . Smokeless tobacco: Never Used  Substance Use Topics  . Alcohol use: No    Alcohol/week: 0.0 standard drinks  . Drug use: No     Allergies   Ace inhibitors   Review of Systems Review of Systems  Constitutional: Negative for chills and fever.  HENT: Positive for congestion and rhinorrhea. Negative for ear pain and sore throat.   Eyes: Negative for visual disturbance.  Respiratory: Positive for cough and shortness of breath.   Cardiovascular: Positive for chest pain (w/ coughing) and leg swelling.  Gastrointestinal: Positive for diarrhea, nausea and vomiting. Negative for abdominal pain, anal bleeding, blood in stool and constipation.  Genitourinary: Negative for dysuria.  Neurological: Negative for dizziness, syncope, weakness, numbness and headaches.  All other systems reviewed and are negative.    Physical Exam Updated Vital Signs BP (!) 181/97 (BP Location: Right Arm)   Pulse 70   Temp 97.9 F (36.6 C) (Oral)   Resp 14   Ht 5\' 6"  (1.676 m)   Wt 89.9 kg   SpO2 98%   BMI 32.01 kg/m   Physical Exam  Constitutional: She is oriented to person, place, and time. She appears well-developed and well-nourished.  Non-toxic appearance. No distress.  HENT:  Head: Normocephalic and atraumatic.  Right Ear: Tympanic membrane normal. Tympanic membrane is not perforated, not erythematous, not retracted and not bulging.  Left Ear: Tympanic membrane normal. Tympanic membrane is not perforated, not erythematous, not  retracted and not bulging.  Nose: Mucosal edema present. Right sinus exhibits no maxillary sinus tenderness and  no frontal sinus tenderness. Left sinus exhibits no maxillary sinus tenderness and no frontal sinus tenderness.  Mouth/Throat: Uvula is midline and oropharynx is clear and moist. No oropharyngeal exudate or posterior oropharyngeal erythema.  Posterior oropharynx is symmetric appearing. Patient tolerating own secretions without difficulty. No trismus. No drooling. No hot potato voice. No swelling beneath the tongue, submandibular compartment is soft.   Eyes: Pupils are equal, round, and reactive to light. Conjunctivae and EOM are normal. Right eye exhibits no discharge. Left eye exhibits no discharge.  Neck: Normal range of motion. Neck supple. No neck rigidity. No edema and no erythema present.  Cardiovascular: An irregularly irregular rhythm present. Tachycardia present.  Pulmonary/Chest: Effort normal. No respiratory distress. She has no wheezes. She has rales (left base).  Abdominal: Soft. She exhibits no distension. There is no tenderness. There is no rigidity, no rebound and no guarding.  Musculoskeletal: She exhibits edema (1+ symmetric lower legs).  Lymphadenopathy:    She has no cervical adenopathy.  Neurological: She is alert and oriented to person, place, and time.  CN III-XII grossly intact.   Skin: Skin is warm and dry. No rash noted.  Psychiatric: She has a normal mood and affect. Her behavior is normal.  Nursing note and vitals reviewed.    ED Treatments / Results  Labs (all labs ordered are listed, but only abnormal results are displayed) Labs Reviewed  BASIC METABOLIC PANEL - Abnormal; Notable for the following components:      Result Value   CO2 20 (*)    Glucose, Bld 137 (*)    BUN 36 (*)    Creatinine, Ser 2.30 (*)    Calcium 7.9 (*)    GFR calc non Af Amer 22 (*)    GFR calc Af Amer 26 (*)    All other components within normal limits  CBC - Abnormal;  Notable for the following components:   Hemoglobin 11.1 (*)    MCV 73.6 (*)    MCH 22.1 (*)    RDW 17.2 (*)    All other components within normal limits  PROTIME-INR - Abnormal; Notable for the following components:   Prothrombin Time 16.2 (*)    All other components within normal limits    EKG EKG Interpretation  Date/Time:  Wednesday July 20 2018 11:40:53 EST Ventricular Rate:  130 PR Interval:    QRS Duration: 93 QT Interval:  357 QTC Calculation: 525 R Axis:   38 Text Interpretation:  Atrial fibrillation LVH with secondary repolarization abnormality ST depression, consider ischemia, diffuse lds Prolonged QT interval agree. QRS morphology unchanged from old Confirmed by Charlesetta Shanks [phone]) on 07/20/2018 12:52:06 PM   Radiology Dg Chest 2 View  Result Date: 07/20/2018 CLINICAL DATA:  Hypertension.  Atrial fibrillation. EXAM: CHEST - 2 VIEW COMPARISON:  November 17, 2017 FINDINGS: There is patchy opacity in the left lower lobe. Lungs elsewhere are clear. Heart is upper normal in size with pulmonary vascularity normal. There are coronary stents on the left. There is aortic atherosclerosis. There is mild degenerative change in the thoracic spine. IMPRESSION: Patchy infiltrate left lower lobe, felt to represent pneumonia. Lungs elsewhere clear. Heart upper normal in size. Foci of aortic atherosclerosis noted. Aortic Atherosclerosis (ICD10-I70.0). Followup PA and lateral chest radiographs recommended in 3-4 weeks following trial of antibiotic therapy to ensure resolution and exclude underlying malignancy. Electronically Signed   By: Lowella Grip III M.D.   On: 07/20/2018 12:09    Procedures Procedures (including critical care time)  Prior Results Reviewed:   ECHO: 09/08/2017 - Left ventricle: The cavity size was mildly dilated. There was severe concentric hypertrophy. Systolic function was mildly to moderately reduced. The estimated ejection fraction was in  the range of 40% to 45%. Mild diffuse hypokinesis with distinct regional wall motion abnormalities. There is akinesis of the midanteroseptal and inferoseptal myocardium. There is akinesis of the apicalinferior myocardium. There was a reduced contribution of atrial contraction to ventricular filling, due to increased ventricular diastolic pressure or atrial contractile dysfunction. Doppler parameters are consistent with a reversible restrictive pattern, indicative of decreased left ventricular diastolic compliance and/or increased left atrial pressure (grade 3 diastolic dysfunction). Doppler parameters are consistent with high ventricular filling pressure. - Mitral valve: There was mild regurgitation. - Left atrium: The atrium was mildly to moderately dilated. - Right atrium: The atrium was mildly dilated. - Pulmonary arteries: PA peak pressure: 43 mm Hg (S). - Inferior vena cava: The vessel was mildly dilated.  Impressions:  - The right ventricular systolic pressure was increased consistent with moderate pulmonary hypertension.  CATH: 09/07/2017  Prox RCA lesion is 75% stenosed.  A drug-eluting stent was successfully placed using a STENT PROMUS PREM MR 2.75X16.  Post intervention, there is a 5% residual stenosis.  Ost RCA lesion is 85% stenosed.  A drug-eluting stent was successfully placed using a STENT PROMUS PREM MR 3.0X12. Overlaps Stent 1 proximally.  Post intervention, there is a 0% residual stenosis.  Mid RCA-1 lesion is 30% stenosed. Mid RCA-2 lesion is 25% stenosed.  Ost 1st Diag lesion is 30% stenosed. Ost 2nd Diag to 2nd Diag lesion is 80% stenosed. Stable.  Prox LAD lesion is 40% stenosed. Previously placed Mid LAD Promus drug eluting stent is widely patent.  There is mild left ventricular systolic dysfunction. The left ventricular ejection fraction is 45-50% by visual estimate.  LV end diastolic pressure is mildly  elevated.  Severe Ostial &proximal RCA stenosis (progression of disease from Oct 2017).  Successful PCI using 2 overlapping DES. Difficult, ostial lesion PCI requiring multiple balloons, buddy wire &extra support wire.   Medications Ordered in ED Medications  diltiazem (CARDIZEM) injection 15 mg (has no administration in time range)  cefTRIAXone (ROCEPHIN) 1 g in sodium chloride 0.9 % 100 mL IVPB (has no administration in time range)  azithromycin (ZITHROMAX) 500 mg in sodium chloride 0.9 % 250 mL IVPB (has no administration in time range)  enoxaparin (LOVENOX) injection 30 mg (has no administration in time range)  acetaminophen (TYLENOL) tablet 650 mg (has no administration in time range)    Or  acetaminophen (TYLENOL) suppository 650 mg (has no administration in time range)  senna-docusate (Senokot-S) tablet 1 tablet (has no administration in time range)  promethazine (PHENERGAN) tablet 12.5 mg (has no administration in time range)     Initial Impression / Assessment and Plan / ED Course  I have reviewed the triage vital signs and the nursing notes.  Pertinent labs & imaging results that were available during my care of the patient were reviewed by me and considered in my medical decision making (see chart for details).   Patient presents to the ED with URI sxs & dyspnea x 1 week, concern for HTN by PCP, and afib w/ rvr per EMS. On exam patient is nontoxic appearing, resting comfortably. She is afebrile, hypertensive, and appears to be in afib w/ RVR on the monitor with HR in the 110s-130s while I am in the exam room. Lungs with focal rales at the left  base. Exam otherwise fairly benign.  EKG does confirm afib w/ RVR, patient has hx of PAF, she denies hx of cardioversion, she is on warfarin but her INR is subtherapeutic, unclear onset, not appropriate for cardioversion. Will start cardizem and monitor.  Additional labs notable for AKI w/ creatinine of 2.30, increased from prior on  record of 1.87, hx of HF with last EF 40-45%, will likely require gentle fluids. She also has a pneumonia on CXR- will start rocephin/azithro for community acquired coverage. W/ her afib w/ RVR, AKI, and pneumonia and per discussion with supervising physician Dr. Johnney Killian feel patient warrants admission for further management. Patient in agreement. Consult placed to hospitalist service.   13:03: CONSULT: Discussed case with Dr. Maricela Bo with teaching service- accepts admission.    Final Clinical Impressions(s) / ED Diagnoses   Final diagnoses:  Atrial fibrillation with rapid ventricular response ([place])  Community acquired pneumonia of left lower lobe of lung ([place])  AKI (acute kidney injury) [facility])    ED Discharge Orders    None       Amaryllis Dyke, PA-C 07/20/18 1323    Charlesetta Shanks, MD 07/21/18 [phone]

## 2018-07-20 NOTE — Progress Notes (Signed)
ANTICOAGULATION CONSULT NOTE - Initial Consult  Pharmacy Consult for warfarin Indication: atrial fibrillation   Patient Measurements Height: 5\' 6"  (167.6 cm) Weight: 198 lb 4.8 oz (89.9 kg) IBW/kg (Calculated) : 59.3  Vital Signs: Temp: 97.9 F (36.6 C) (12/11 1142) Temp Source: Oral (12/11 1142) BP: 151/87 (12/11 1430) Pulse Rate: 67 (12/11 1430)  Labs: Recent Labs    07/20/18 1142  HGB 11.1*  HCT 37.0  PLT 284  LABPROT 16.2*  INR 1.31  CREATININE 2.30*    Estimated Creatinine Clearance: 28.6 mL/min (A) (by C-G formula based on SCr of 2.3 mg/dL (H)).   Medical History: Past Medical History:  Diagnosis Date  . Anemia   . CHF (congestive heart failure) ([place])   . CKD (chronic kidney disease), stage III ([place])   . Coronary artery disease   . GERD (gastroesophageal reflux disease)   . Heart murmur   . Hyperlipidemia   . Hypertension   . Proliferative diabetic retinopathy ([place])    left eye with vitreous hemorrhage and tractional retinal detachment  . Stroke ([place]) 09/2014   numbness left upper lip, finger tips on left hand; "resolved" (05/18/2016)  . Type II diabetes mellitus ([place])   . Wears glasses      Assessment: 62 yo female wih chest pain, cough, SOB, dizziness. Patient is on warfarin prior to admission for AFib. She was in AFib in while she was in EMS but converted. Questionable compliance with medications. INR 1.3.  Warfarin PTA: 7.5 q Mon/Wed/Fri, 5 mg all other days  Goal of Therapy:  INR 2-3 Monitor platelets by anticoagulation protocol: Yes   Plan:  -warfarin 7.5 mg po x1 -daily INR   Harvel Quale 07/20/2018,3:06 PM

## 2018-07-20 NOTE — Progress Notes (Signed)
Notified by nurse tech patient threw up. Upon assessment pt throwing up and having runny stools. pts states feeling weak and dizzy. Patient bp 90/50s  and HR 50s. Paged MD,  Got orders for a 500cc NS bolus and IV antiemetic. Will continue to monitor patient.

## 2018-07-20 NOTE — ED Notes (Signed)
Pt in SR with PACs in 60-70s. EKG captured.

## 2018-07-20 NOTE — Progress Notes (Signed)
Follow Up  Subjective:    Patient ID: Amber Young, female    DOB: 1956/06/08, 62 y.o.   MRN: [id number]  Chief Complaint  Patient presents with  . Wheezing  . Shortness of Breath  . Cough  . Emesis   HPI  Ms. Spurlock is a 62 year female with a past medical history of Diabetes, Stroke, Diabetic Retinopathy, Hypertension, Heart Murmur, GERD, CAD, CKD, CHF, A-fib, and Anemia. She is here today for follow up.   Current Status: Since her last office visit, she reports mild chest pain, cough, heart palpitations, dizziness, shortness of breath. Not feeling like herself for the last 7 days. She has had a couple of incidents of nausea and vomiting. She has had increased fatigue lately. Reports that it took her a few hours to get dressed today. She continues to have dizziness, which she r/t her blood pressure medications. She is accompanied today by her husband.   She denies fevers, chills, recent infections, weight loss, and night sweats. She has not had any headaches, visual changes, and falls. No reports of GI problems such as nausea, vomiting, diarrhea, and constipation. She has no reports of blood in stools, dysuria and hematuria. No depression or anxiety reported. She denies pain today.   Review of Systems  Constitutional: Positive for fatigue (increased).  HENT: Negative.   Respiratory: Positive for cough, chest tightness and shortness of breath.   Cardiovascular: Positive for chest pain and palpitations.  Gastrointestinal: Positive for abdominal distention.  Endocrine: Negative.   Genitourinary: Negative.   Musculoskeletal: Positive for arthralgias (generalized).  Skin: Negative.   Allergic/Immunologic: Negative.   Neurological: Positive for dizziness (Frequently) and headaches (Occasional).  Hematological: Negative.   Psychiatric/Behavioral: The patient is nervous/anxious (increased anxiety today. ).      Objective:   Physical Exam Vitals signs and nursing note reviewed.   Constitutional:      General: She is in acute distress.     Appearance: She is well-developed and normal weight. She is ill-appearing.  HENT:     Head: Normocephalic and atraumatic.     Mouth/Throat:     Mouth: Mucous membranes are moist.     Pharynx: Oropharynx is clear.  Eyes:     Extraocular Movements: Extraocular movements intact.     Pupils: Pupils are equal, round, and reactive to light.  Neck:     Musculoskeletal: Normal range of motion and neck supple.  Cardiovascular:     Rate and Rhythm: Regular rhythm. Tachycardia present.     Comments: Elevated heart rate Pulmonary:     Effort: Pulmonary effort is normal. Tachypnea present.     Breath sounds: Normal breath sounds.  Abdominal:     General: Bowel sounds are normal.     Palpations: Abdomen is soft.  Skin:    General: Skin is warm and dry.  Neurological:     Mental Status: She is alert.  Psychiatric:        Mood and Affect: Mood is anxious.        Behavior: Behavior normal.    Assessment & Plan:   1. Essential hypertension Blood pressures are elevated today. Patient refuses Clonidine. We will transfer her to ED for further evaluation today. EMS called for transport.   2. Elevated blood pressure reading  3. Uncontrolled type 2 diabetes mellitus with hyperglycemia (HCC) Improved. Hgb A1c is mildly decreased at 9.3, from 9.7 on 05/18/2018. She will continue antidiabetic medications as prescribed. She will continue to decrease foods/beverages high in  sugars and carbs and follow Heart Healthy or DASH diet. Increase physical activity to at least 30 minutes cardio exercise daily.  - POCT glycosylated hemoglobin (Hb A1C) - POCT glucose (manual entry)  4. Dizziness Increased today. Monitor.   5. Follow up She will schedule follow up after ED visit.   No orders of the defined types were placed in this encounter.   Kathe Becton,  MSN, FNP-C Patient [facility] [address] [place], [place] [address] [phone]

## 2018-07-20 NOTE — ED Notes (Signed)
Pt back in afib/flut 120s. EKG captured.

## 2018-07-20 NOTE — ED Triage Notes (Signed)
Pt presents to ED from PCP office for their concerns of high BP with hx of same. Pt doesn't know whether she's taken her blood pressure medicine the last few days. EMS found her to be in Afib with hx of same. Converted without intervention in route to ST but then back to afib (140-160). Has had heaviness in her central since Thanksgiving, worse with cough and worsening in the last few days.

## 2018-07-20 NOTE — H&P (Signed)
Date: 07/20/2018               Patient Name:  Amber Young MRN: [id number]  DOB: [birth year] Age / Sex: 62 y.o., female   PCP: Azzie Glatter, FNP         Medical Service: Internal Medicine Teaching Service         Attending Physician: Dr. Sid Falcon, MD    First Contact: Dr. Donne Hazel Pager: [phone]  Second Contact: Dr. Maricela Bo Pager: [phone]       After Hours (After 5p/  First Contact Pager: [phone]  weekends / holidays): Second Contact Pager: [phone]   Chief Complaint: sob   History of Present Illness:   62yo female with DM2, HTN, HLD, GERD, CAD s/p PCI w/ stents to LAD/RCA, CKD3, CHF (EF 40-45%, g3dd), anemia, a.fib on coumadin,  who presents from PCP office for hypertension.   She reports 7 days of URI symptoms (rhinorrhea, dry cough, congestion), subjective fevers, palpitations, dizziness, sob, wheezing, fatigue, decreased po intake, diarrhea (watery, 2x/day), and one episode of vomiting. Her URI symptoms have improved and the palpitations, sob, wheezing, nighttime dry cough, fatigue, and dizziness are bothering her the most.   She also endorses chest pain but only when she coughs. The chest pain is not exertional. She denies associated numbness or pain radiating into her arms/neck/jaw.   She is supposed to take lasix daily but does not remember when she last took lasix, and knows she didn't take it last night. She endorses orthopnea, but denies PND, extremity/abdominal swelling, and does not weigh herself daily.    She is supposed to take lantus 20 units nightly, but did not take it last night. She reports her CBGs have been 86-159.   ROS is also positive for increased urinary frequency but denies dysuria.   Denies hematochezia, melena, hematemesis, sick contacts. Has not received a flu shot yet this season.    Meds:     Current Outpatient Medications on File Prior to Encounter  Medication Sig Dispense Refill  . carvedilol (COREG) 25 MG tablet Take 2  tablets (50 mg total) by mouth 2 (two) times daily. 60 tablet 3  . furosemide (LASIX) 40 MG tablet Take 1 tablet (40 mg total) by mouth daily. 90 tablet 0  . insulin glargine (LANTUS) 100 UNIT/ML injection Inject 0.2 mLs (20 Units total) into the skin 2 (two) times daily. 20 mL 11  . losartan (COZAAR) 100 MG tablet Take 1 tablet (100 mg total) by mouth daily. 30 tablet 1  . ticagrelor (BRILINTA) 90 MG TABS tablet Take 1 tablet (90 mg total) by mouth 2 (two) times daily. 180 tablet 3  . warfarin (COUMADIN) 5 MG tablet Take 1 to 1.5 tablets by mouth daily as directed by coumadin clinic 120 tablet 1  . amLODipine (NORVASC) 10 MG tablet Take 1 tablet (10 mg total) by mouth daily. 90 tablet 2  . atorvastatin (LIPITOR) 40 MG tablet Take 1 tablet (40 mg total) by mouth daily at 6 PM. 90 tablet 1  . ferrous sulfate (FERROUSUL) 325 (65 FE) MG tablet Take 1 tablet (325 mg total) by mouth every other day. 90 tablet 3  . Glucose Blood (BLOOD GLUCOSE TEST STRIPS) STRP Use as directed 200 each 0  . hydrALAZINE (APRESOLINE) 50 MG tablet Take 1.5 tablets (75 mg total) by mouth 3 (three) times daily. (Patient taking differently: Take 50 mg by mouth 3 (three) times daily. ) 135 tablet 6  . hydrochlorothiazide (  HYDRODIURIL) 25 MG tablet Take 1 tablet (25 mg total) by mouth daily. 30 tablet 1  . Insulin Pen Needle 29G X 10MM MISC Use as directed 100 each 3  . INSULIN SYRINGE .5CC/29G 29G X 1/2" 0.5 ML MISC Use to administer insulin three times daily 200 each 5  . ketorolac (ACULAR) 0.5 % ophthalmic solution Place 1 drop into the right eye 4 (four) times daily. 10 mL 2  . nitroGLYCERIN (NITROSTAT) 0.4 MG SL tablet Place 1 tablet (0.4 mg total) under the tongue every 5 (five) minutes x 3 doses as needed for chest pain. 30 tablet 12  . potassium chloride (K-DUR,KLOR-CON) 10 MEQ tablet Take 1 tablet with each dose of furosemide, daily. 30 tablet 3  . prednisoLONE acetate (PRED FORTE) 1 % ophthalmic suspension Place 1 drop  into the left eye 4 (four) times daily. 10 mL 1  . TRUEPLUS PEN NEEDLES 31G X 6 MM MISC USE AS DIRECTED 100 each 0     Allergies: Allergies as of 07/20/2018 - Review Complete 07/20/2018  Allergen Reaction Noted  . Ace inhibitors Cough 12/11/2015   Past Medical History:  Diagnosis Date  . Anemia   . CHF (congestive heart failure) ([place])   . CKD (chronic kidney disease), stage III ([place])   . Coronary artery disease   . GERD (gastroesophageal reflux disease)   . Heart murmur   . Hyperlipidemia   . Hypertension   . 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 Stroke ([place]) 09/2014   numbness left upper lip, finger tips on left hand; "resolved" (05/18/2016)  . Type II diabetes mellitus ([place])   . Wears glasses     Family History: Mom with diabetes, HTN, COPD, CHF. Htn (sister and brother). Grandfather with heart attack before age 50.   Social History: Married. Lives with husband and a 40yo child. 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 Never smoker, no alcohol or drug use.   Review of Systems: Review of Systems  Constitutional: Positive for malaise/fatigue. Negative for chills, diaphoresis and fever.  HENT: Positive for congestion and sore throat.   Eyes: Negative for discharge and redness.  Respiratory: Positive for cough, shortness of breath and wheezing. Negative for sputum production.   Cardiovascular: Positive for chest pain, palpitations and orthopnea. Negative for PND.  Gastrointestinal: Positive for diarrhea, nausea and vomiting. Negative for blood in stool.  Genitourinary: Positive for frequency. Negative for dysuria.  Skin: Negative for rash.  Neurological: Positive for dizziness. Negative for focal weakness.     Physical Exam: Blood pressure (!) 149/99, pulse 84, temperature 97.9 F (36.6 C), temperature source Oral, resp. rate 16, height 5\' 6"  (1.676 m), weight 89.9 kg, SpO2  100 %. Vitals:   07/20/18 1145 07/20/18 1230 07/20/18 1245 07/20/18 1300  BP: (!) 179/106 (!) 135/98 (!) 156/108 (!) 149/99  Pulse: (!) 40 62 (!) 59 84  Resp: 17 13 15 16   Temp:      TempSrc:      SpO2: 98% 99% 100% 100%  Weight:      Height:       General: Vital signs reviewed.  Patient is well-developed and well-nourished, in no acute distress and cooperative with exam.  Head: Normocephalic and atraumatic. Eyes: EOMI, conjunctivae normal, no scleral icterus.  Neck: Supple, no JVD, masses, thyromegaly Cardiovascular: tachycardic, irregularly irregular, S1 normal, S2 normal, no murmurs, gallops, or rubs. Pulmonary/Chest: able  to speak in full sentences without difficulty. scattered bilateral wet crackles, most significant at left base. Few expiratory wheezes.  Abdominal: Soft, non-tender, non-distended, BS + Musculoskeletal: No joint deformities, erythema, or stiffness, ROM full and nontender. Extremities: Trace to 1+ lower extremity edema bilaterally,  pulses symmetric and intact bilaterally. No cyanosis or clubbing. Neurological: A&O x3, Strength is normal and symmetric bilaterally, cranial nerve II-XII are grossly intact, no focal motor deficit, sensory intact to light touch bilaterally.  Skin: Warm, dry and intact. No rashes or erythema. Psychiatric: Normal mood and affect. speech and behavior is normal. Cognition is slightly delayed. Memory is normal.   WBC 5.2 Hemoglobin 11.1 MCV 73  Na 136 K 3.5 CO2 20 Glucose 137 BUN 36 Creatine 2.3 Anion gap 15  INR 1.3  EKG: personally reviewed my interpretation is a. Fib, HR 130, LVH  CXR: personally reviewed my interpretation is left lower lobe infiltrate, normal heart size   Assessment & Plan by Problem: Active Problems:   Paroxysmal atrial fibrillation with RVR ([place])   62yo female with DM2, HTN, HLD, GERD, CAD s/p PCI w/ stents to LAD/RCA, CKD3, CHF (EF 40-45%, g3dd), anemia, a.fib on coumadin,  who presents with 7 days  of URI symptoms (rhinorrhea, dry cough, congestion), palpitations, dizziness, sob, wheezing, fatigue, decreased po intake, n/v/d found to be in a.fib with RVR and possible left lower lobe pneumonia.   A. Fib with RVR: Likely caused by preceding URI vs possible pneumonia. Presented with palpitations, sob, fatigue and HR 140-160s. Given diltiazem 15mg , EKG with A. Fib. Breathing well on RA. Heart rate in the low 100s during my encounter, irregular rhythm. Takes coumadin. INR 1.3. Has not taken her home medications today.  - home coreg 50mg  bid - continue home warfarin, bridge with Lovenox since INR is subtherapeutic  - telemetry   Possible Pneumonia: CXR with patchy left lower lobe infiltrate could be consistent with pneumonia. Afebrile without leukocytosis. Could be viral pneumonia +/- volume overload.   - s/p azithromycin and ceftriaxone in the ED - reassess tomorrow for continued need of antibiotics.  - phenergan prn nausea  - sputum culture   CHF: ischemic cardiomyopathy.  Last echo Jan 2019 with EF 40-50%, g3dd, mild diffuse hypokinesis, moderate PAH.home meds include lasix 40mg  qd, coreg 50mg  bid. Unsure when she last took lasix. Does not know her dry weight. Hypervolemic on exam with bilateral crackles and trace to 1+ LEE - repeat echo  - home lasix dose 40mg  daily  - strict I&Os, daily weights   CKD: Creatine 2.3 on admission. Baseline appears to be roughly 1.5. Likely pre-renal in the setting of decreased po and n/v/d.  - gentle IVFs 75cc/hr  HTN: 180s/90s on admission. Historically difficult to control. Given clonidine in PCPs office before.  - continue home hydralazine, losartan, amlodipine  DM: Last a1c Dec 2019 was 9.3. Home Lantus 20 units nightly - Lantus 12 units nightly - novolog SSI tid wc   CAD: s/p PCI w/ stents to LAD/RCA, last heart cath 2019.  - continue home ticagrelor, atorvastatin  Microcytic anemia: Hemoglobin 11 (improved from prior labs). Microcytosis. Anemia  studies done in May 2019 showed low normal ferritin and TIBC, could be consistent with iron deficiency anemia.  - continue home po iron  FEN/GI: diabetic diet, NS 75cc/hr DVT ppx: warfarin, bridge with lovenox   Dispo: Admit patient to Observation with expected length of stay less than 2 midnights.  Signed: Isabelle Course, MD 07/20/2018, 2:16 PM  Pager: [phone]

## 2018-07-21 ENCOUNTER — Inpatient Hospital Stay (HOSPITAL_COMMUNITY): Payer: BLUE CROSS/BLUE SHIELD

## 2018-07-21 ENCOUNTER — Encounter (INDEPENDENT_AMBULATORY_CARE_PROVIDER_SITE_OTHER): Payer: BLUE CROSS/BLUE SHIELD | Admitting: Ophthalmology

## 2018-07-21 DIAGNOSIS — J121 Respiratory syncytial virus pneumonia: Secondary | ICD-10-CM

## 2018-07-21 DIAGNOSIS — B974 Respiratory syncytial virus as the cause of diseases classified elsewhere: Secondary | ICD-10-CM

## 2018-07-21 DIAGNOSIS — I480 Paroxysmal atrial fibrillation: Principal | ICD-10-CM

## 2018-07-21 DIAGNOSIS — I361 Nonrheumatic tricuspid (valve) insufficiency: Secondary | ICD-10-CM

## 2018-07-21 DIAGNOSIS — I340 Nonrheumatic mitral (valve) insufficiency: Secondary | ICD-10-CM

## 2018-07-21 LAB — URINALYSIS, ROUTINE W REFLEX MICROSCOPIC
Bilirubin Urine: NEGATIVE
Glucose, UA: 150 mg/dL — AB
Ketones, ur: NEGATIVE mg/dL
Leukocytes, UA: NEGATIVE
Nitrite: NEGATIVE
Protein, ur: >=300 mg/dL — AB
Specific Gravity, Urine: 1.013 (ref 1.005–1.030)
pH: 6 (ref 5.0–8.0)

## 2018-07-21 LAB — CBC
HCT: 27.8 % — ABNORMAL LOW (ref 36.0–46.0)
Hemoglobin: 8.8 g/dL — ABNORMAL LOW (ref 12.0–15.0)
MCH: 22.8 pg — ABNORMAL LOW (ref 26.0–34.0)
MCHC: 31.7 g/dL (ref 30.0–36.0)
MCV: 72 fL — ABNORMAL LOW (ref 80.0–100.0)
Platelets: 210 10*3/uL (ref 150–400)
RBC: 3.86 MIL/uL — ABNORMAL LOW (ref 3.87–5.11)
RDW: 17.2 % — ABNORMAL HIGH (ref 11.5–15.5)
WBC: 3.4 10*3/uL — ABNORMAL LOW (ref 4.0–10.5)
nRBC: 0 % (ref 0.0–0.2)

## 2018-07-21 LAB — COMPREHENSIVE METABOLIC PANEL
ALK PHOS: 79 U/L (ref 38–126)
ALT: 21 U/L (ref 0–44)
AST: 40 U/L (ref 15–41)
Albumin: 1.8 g/dL — ABNORMAL LOW (ref 3.5–5.0)
Anion gap: 9 (ref 5–15)
BUN: 36 mg/dL — AB (ref 8–23)
CO2: 20 mmol/L — ABNORMAL LOW (ref 22–32)
Calcium: 7.2 mg/dL — ABNORMAL LOW (ref 8.9–10.3)
Chloride: 107 mmol/L (ref 98–111)
Creatinine, Ser: 2.4 mg/dL — ABNORMAL HIGH (ref 0.44–1.00)
GFR calc Af Amer: 24 mL/min — ABNORMAL LOW (ref >60)
GFR calc non Af Amer: 21 mL/min — ABNORMAL LOW (ref >60)
Glucose, Bld: 134 mg/dL — ABNORMAL HIGH (ref 70–99)
Potassium: 3.4 mmol/L — ABNORMAL LOW (ref 3.5–5.1)
Sodium: 136 mmol/L (ref 135–145)
Total Bilirubin: 0.6 mg/dL (ref 0.3–1.2)
Total Protein: 5.1 g/dL — ABNORMAL LOW (ref 6.5–8.1)

## 2018-07-21 LAB — RESPIRATORY PANEL BY PCR
Adenovirus: NOT DETECTED
Bordetella pertussis: NOT DETECTED
CHLAMYDOPHILA PNEUMONIAE-RVPPCR: NOT DETECTED
Coronavirus 229E: NOT DETECTED
Coronavirus HKU1: NOT DETECTED
Coronavirus NL63: NOT DETECTED
Coronavirus OC43: NOT DETECTED
Influenza A: NOT DETECTED
Influenza B: NOT DETECTED
Metapneumovirus: NOT DETECTED
Mycoplasma pneumoniae: NOT DETECTED
PARAINFLUENZA VIRUS 1-RVPPCR: NOT DETECTED
Parainfluenza Virus 2: NOT DETECTED
Parainfluenza Virus 3: NOT DETECTED
Parainfluenza Virus 4: NOT DETECTED
RESPIRATORY SYNCYTIAL VIRUS-RVPPCR: DETECTED — AB
Rhinovirus / Enterovirus: NOT DETECTED

## 2018-07-21 LAB — BASIC METABOLIC PANEL
Anion gap: 9 (ref 5–15)
BUN: 37 mg/dL — AB (ref 8–23)
CO2: 18 mmol/L — ABNORMAL LOW (ref 22–32)
CREATININE: 2.64 mg/dL — AB (ref 0.44–1.00)
Calcium: 7.5 mg/dL — ABNORMAL LOW (ref 8.9–10.3)
Chloride: 108 mmol/L (ref 98–111)
GFR calc non Af Amer: 19 mL/min — ABNORMAL LOW (ref >60)
GFR, EST AFRICAN AMERICAN: 22 mL/min — AB (ref >60)
Glucose, Bld: 111 mg/dL — ABNORMAL HIGH (ref 70–99)
POTASSIUM: 4 mmol/L (ref 3.5–5.1)
Sodium: 135 mmol/L (ref 135–145)

## 2018-07-21 LAB — GLUCOSE, CAPILLARY
GLUCOSE-CAPILLARY: 165 mg/dL — AB (ref 70–99)
GLUCOSE-CAPILLARY: 102 mg/dL — AB (ref 70–99)
Glucose-Capillary: 99 mg/dL (ref 70–99)
Glucose-Capillary: 146 mg/dL — ABNORMAL HIGH (ref 70–99)

## 2018-07-21 LAB — ECHOCARDIOGRAM COMPLETE
Height: 66 in
Weight: 3054.4 oz

## 2018-07-21 LAB — PROTIME-INR
INR: 1.43
Prothrombin Time: 17.2 seconds — ABNORMAL HIGH (ref 11.4–15.2)

## 2018-07-21 LAB — C DIFFICILE QUICK SCREEN W PCR REFLEX
C Diff antigen: NEGATIVE
C Diff interpretation: NOT DETECTED
C Diff toxin: NEGATIVE

## 2018-07-21 LAB — HIV ANTIBODY (ROUTINE TESTING W REFLEX): HIV Screen 4th Generation wRfx: NONREACTIVE

## 2018-07-21 MED ORDER — SODIUM CHLORIDE 0.9 % IV SOLN
Freq: Once | INTRAVENOUS | Status: AC
  Administered 2018-07-21: 11:00:00 via INTRAVENOUS
  Filled 2018-07-21: qty 500

## 2018-07-21 MED ORDER — ENOXAPARIN SODIUM 100 MG/ML ~~LOC~~ SOLN
1.0000 mg/kg | SUBCUTANEOUS | Status: DC
  Administered 2018-07-21: 85 mg via SUBCUTANEOUS
  Filled 2018-07-21: qty 1

## 2018-07-21 MED ORDER — CARVEDILOL 25 MG PO TABS
25.0000 mg | ORAL_TABLET | Freq: Two times a day (BID) | ORAL | Status: DC
  Administered 2018-07-21 – 2018-07-22 (×2): 25 mg via ORAL
  Filled 2018-07-21 (×2): qty 1

## 2018-07-21 MED ORDER — WARFARIN SODIUM 7.5 MG PO TABS
7.5000 mg | ORAL_TABLET | Freq: Once | ORAL | Status: AC
  Administered 2018-07-21: 7.5 mg via ORAL
  Filled 2018-07-21: qty 1

## 2018-07-21 NOTE — Progress Notes (Addendum)
   Subjective: Had diarrhea and vomiting overnight but is feeling much better this morning. "I don't feel as foggy today."   Objective:  Vital signs in last 24 hours: Vitals:   07/20/18 1805 07/20/18 2010 07/20/18 2025 07/21/18 0426  BP: (!) 156/88 (!) 95/59 (!) 95/55 117/69  Pulse: 86 (!) 51 (!) 50 (!) 53  Resp: 16 20  17   Temp: 97.9 F (36.6 C) 97.9 F (36.6 C)  (!) 97.4 F (36.3 C)  TempSrc: Oral Oral  Oral  SpO2: 100% 98%  100%  Weight:    86.6 kg  Height:       Gen: laying in bed, resting comfortably  Cardiac: RRR Pulm: crackles at bilateral bases, otherwise CTAB Ext: trace left LEE, no tenderness/redness/warmth  Assessment/Plan:  Active Problems:   Paroxysmal atrial fibrillation with RVR ([place])  62yo female with DM2, HTN, HLD, GERD, CAD s/p PCI w/ stents to LAD/RCA, CKD3, CHF (EF 40-45%, g3dd), anemia, a.fib on coumadin,  who presents with 7 days of URI symptoms (rhinorrhea, dry cough, congestion), palpitations, dizziness, sob, wheezing, fatigue, decreased po intake, n/v/d found to be in a.fib with RVR and possible left lower lobe pneumonia.   A. Fib with RVR: resolved. Likely precipitated from a viral URI/gastroenteritis. Heart rate in the 50s this morning and sinus rhythm on tele. She is no longer feeling dizzy or confused  - decrease home coreg from 50mg  bid to 25mg  bid  - warfarin, bridging with lovenox   CHF: saturating well on RA. Crackles at bilateral bases and trace, chronic left leg edema. No JVD. Most likely hypovolemic given low Bps.  Echo 1 year ago with EF40%, g3dd - repeat echo today. - holding home lasix due to hypovolemia  - assess for sob since we are giving IVFs  CKD3: creatine unchanged despite 1.2L IVFs. Had another episode of diarrhea and vomiting overnight. BUN also remains elevated.  - continue IVFs, 500cc bolus over 1 hour with 10mEq KCL - repeat BMP this afternoon   HTN: hypotensive overnight after receiving her home meds.  - holding home  hydralazine, losartan, amlodipine  - IVFs as above  DM2: CBGs 99-142, decreased po intake  - continue lantus 12 units nightly    Dispo: Anticipated discharge in approximately 1 day(s).   Isabelle Course, MD 07/21/2018, 10:22 AM Pager: [phone]

## 2018-07-21 NOTE — Progress Notes (Signed)
  Echocardiogram 2D Echocardiogram has been performed.  Amber Young 07/21/2018, 2:24 PM

## 2018-07-21 NOTE — Care Management Note (Addendum)
Case Management Note  Patient Details  Name: Amber Young MRN: [id number] Date of Birth: 1956/03/19  Subjective/Objective:                    Action/Plan:  Patient from home w family. Discussed home needs with patient at bedside. Provided with Medicare quality list for [facility] providers, copy placed in chart. Patient states that she gets confused by her medications. Agreeable to [facility] RN for assist after DC. WOuld like to start at top of list and go down.  Will start w [facility], they declined. [facility] declined. [facility] no response. [facility] declined.[facility] declined. [facility] ([place]) accepted referral. Needs HH RN ORDER. Patient denies difficulties w ambulation/ DME or other home needs.  Will place consult for pharmacy for medication education/ support.   Expected Discharge Date:                  Expected Discharge Plan:     In-House Referral:     Discharge planning Services     Post Acute Care Choice:    Choice offered to:     DME Arranged:    DME Agency:     HH Arranged:    HH Agency:     Status of Service:     If discussed at [employer], dates discussed:    Additional Comments:  Carles Collet, RN 07/21/2018, 3:17 PM

## 2018-07-21 NOTE — Progress Notes (Addendum)
ANTICOAGULATION CONSULT NOTE - Initial Consult  Pharmacy Consult for warfarin Indication: atrial fibrillation  Patient Measurements Height: 5\' 6"  (167.6 cm) Weight: 190 lb 14.4 oz (86.6 kg) IBW/kg (Calculated) : 59.3  Vital Signs: Temp: 97.4 F (36.3 C) (12/12 0426) Temp Source: Oral (12/12 0426) BP: 129/71 (12/12 0900) Pulse Rate: 53 (12/12 0426)  Labs: Recent Labs    07/20/18 1142 07/21/18 0322  HGB 11.1* 8.8*  HCT 37.0 27.8*  PLT 284 210  LABPROT 16.2* 17.2*  INR 1.31 1.43  CREATININE 2.30* 2.40*    Estimated Creatinine Clearance: 26.9 mL/min (A) (by C-G formula based on SCr of 2.4 mg/dL (H)).  Assessment: CC/HPI: 62 yo f presenting with URI x 1 week  PMH: DM2, HTN, GERD, CABG, CHF, afib  Anticoag: warfarin PTA for AFib, Admit INR 1.31 - last dose unknown Enox bridge   INR 1.43  PTA warfarin 7.5 mg MWF, 5 mg AOD   Renal: SCr 2.4  Heme/Onc: H&H 8.8/27.8, Plt 210  Goal of Therapy:  INR 2-3 Monitor platelets by anticoagulation protocol: Yes   Plan:  -warfarin 7.5 mg po x1 -change to 1 mg/kg q24h enoxaparin bridge -daily INR  Levester Fresh, PharmD, BCPS, BCCCP Clinical Pharmacist [phone]  Please check AMION for all [place] numbers  07/21/2018 12:52 PM

## 2018-07-22 DIAGNOSIS — I5023 Acute on chronic systolic (congestive) heart failure: Secondary | ICD-10-CM

## 2018-07-22 LAB — CBC WITH DIFFERENTIAL/PLATELET
Abs Immature Granulocytes: 0 10*3/uL (ref 0.00–0.07)
Basophils Absolute: 0 10*3/uL (ref 0.0–0.1)
Basophils Relative: 0 %
EOS ABS: 0 10*3/uL (ref 0.0–0.5)
Eosinophils Relative: 0 %
HCT: 29.3 % — ABNORMAL LOW (ref 36.0–46.0)
Hemoglobin: 8.9 g/dL — ABNORMAL LOW (ref 12.0–15.0)
LYMPHS ABS: 1.4 10*3/uL (ref 0.7–4.0)
Lymphocytes Relative: 35 %
MCH: 22.2 pg — ABNORMAL LOW (ref 26.0–34.0)
MCHC: 30.4 g/dL (ref 30.0–36.0)
MCV: 73.1 fL — ABNORMAL LOW (ref 80.0–100.0)
Monocytes Absolute: 0.1 10*3/uL (ref 0.1–1.0)
Monocytes Relative: 2 %
Neutro Abs: 2.5 10*3/uL (ref 1.7–7.7)
Neutrophils Relative %: 63 %
Platelets: 260 10*3/uL (ref 150–400)
RBC: 4.01 MIL/uL (ref 3.87–5.11)
RDW: 17.6 % — ABNORMAL HIGH (ref 11.5–15.5)
WBC: 3.9 10*3/uL — ABNORMAL LOW (ref 4.0–10.5)
nRBC: 0 % (ref 0.0–0.2)
nRBC: 1 /100 WBC — ABNORMAL HIGH

## 2018-07-22 LAB — BASIC METABOLIC PANEL
Anion gap: 15 (ref 5–15)
BUN: 34 mg/dL — ABNORMAL HIGH (ref 8–23)
CO2: 17 mmol/L — ABNORMAL LOW (ref 22–32)
Calcium: 7.7 mg/dL — ABNORMAL LOW (ref 8.9–10.3)
Chloride: 108 mmol/L (ref 98–111)
Creatinine, Ser: 2.35 mg/dL — ABNORMAL HIGH (ref 0.44–1.00)
GFR calc Af Amer: 25 mL/min — ABNORMAL LOW (ref >60)
GFR calc non Af Amer: 21 mL/min — ABNORMAL LOW (ref >60)
Glucose, Bld: 69 mg/dL — ABNORMAL LOW (ref 70–99)
Potassium: 3.3 mmol/L — ABNORMAL LOW (ref 3.5–5.1)
SODIUM: 140 mmol/L (ref 135–145)

## 2018-07-22 LAB — GLUCOSE, CAPILLARY
GLUCOSE-CAPILLARY: 111 mg/dL — AB (ref 70–99)
Glucose-Capillary: 147 mg/dL — ABNORMAL HIGH (ref 70–99)
Glucose-Capillary: 92 mg/dL (ref 70–99)
Glucose-Capillary: 231 mg/dL — ABNORMAL HIGH (ref 70–99)

## 2018-07-22 LAB — PROTIME-INR
INR: 2.04
PROTHROMBIN TIME: 22.8 s — AB (ref 11.4–15.2)

## 2018-07-22 MED ORDER — WARFARIN SODIUM 5 MG PO TABS
5.0000 mg | ORAL_TABLET | Freq: Once | ORAL | Status: AC
  Administered 2018-07-22: 5 mg via ORAL
  Filled 2018-07-22: qty 1

## 2018-07-22 MED ORDER — CARVEDILOL 12.5 MG PO TABS
12.5000 mg | ORAL_TABLET | Freq: Two times a day (BID) | ORAL | Status: DC
  Administered 2018-07-22 – 2018-07-23 (×2): 12.5 mg via ORAL
  Filled 2018-07-22 (×2): qty 1

## 2018-07-22 MED ORDER — FUROSEMIDE 10 MG/ML IJ SOLN
40.0000 mg | Freq: Once | INTRAMUSCULAR | Status: AC
  Administered 2018-07-22: 40 mg via INTRAVENOUS
  Filled 2018-07-22: qty 4

## 2018-07-22 MED ORDER — LOSARTAN POTASSIUM 50 MG PO TABS
50.0000 mg | ORAL_TABLET | Freq: Every day | ORAL | Status: DC
  Administered 2018-07-22 – 2018-07-23 (×2): 50 mg via ORAL
  Filled 2018-07-22 (×2): qty 1

## 2018-07-22 NOTE — Progress Notes (Addendum)
   Subjective: Amber Young. Had several episodes of diarrhea yesterday. Tolerating po but has decreased appetite. Still feeling dizzy with ambulation   Objective:  Vital signs in last 24 hours: Vitals:   07/21/18 0900 07/21/18 1340 07/21/18 2147 07/22/18 0417  BP: 129/71 125/69 (!) 150/72 (!) 162/78  Pulse:  (!) 52 (!) 54 (!) 54  Resp:   13   Temp:  97.6 F (36.4 C) 97.7 F (36.5 C) (!) 97.5 F (36.4 C)  TempSrc:  Oral Oral Oral  SpO2:   99% 99%  Weight:    87 kg  Height:       Gen: sitting up in bed eating breakfast Card: RRR, +JVD Pulm: bibasilar crackles Ext: no LEE Abd: soft, NT, ND  Assessment/Plan:  Active Problems:   Paroxysmal atrial fibrillation with RVR ([place])   62yo female with DM2, HTN, HLD, GERD, CAD s/p PCI w/ stents to LAD/RCA, CKD3, CHF (EF 40-45%, g3dd), anemia, a.fib on coumadin, who presents with7 days of URI symptoms (rhinorrhea, dry cough, congestion),palpitations, dizziness, sob, wheezing, fatigue, decreased po intake, n/v/d found to be in a.fib with RVR and possible left lower lobe pneumonia.  CHF: echo showed dilated IVC, creatine remains elevated likely due to volume overload.  - IV lasix 40mg  once - strict I&O  HTN: held home meds yesterday due to hypotension. Now hypertensive and volume overloaded. Diuresis as above - resume home amlodipine 10mg  - resume home losartan at half the dose - holding home po lasix   PAF: remains in sinus rhythm heart rate in the 50s - continue coreg at decreased dose of 25mg  bid   - continue home warfarin. INR is therapeutic. D/c lovenox   Dispo: Anticipated discharge in approximately 0-1 day(s).   Isabelle Course, MD 07/22/2018, 8:23 AM Pager: [phone]

## 2018-07-22 NOTE — Progress Notes (Signed)
ANTICOAGULATION CONSULT NOTE - Follow-Up Consult  Pharmacy Consult for warfarin Indication: atrial fibrillation  Patient Measurements Height: 5\' 6"  (167.6 cm) Weight: 191 lb 12.8 oz (87 kg) IBW/kg (Calculated) : 59.3  Vital Signs: Temp: 97.5 F (36.4 C) (12/13 0417) Temp Source: Oral (12/13 0417) BP: 162/78 (12/13 0417) Pulse Rate: 54 (12/13 0417)  Labs: Recent Labs    07/20/18 1142 07/21/18 0322 07/21/18 1607 07/22/18 0630  HGB 11.1* 8.8*  --  8.9*  HCT 37.0 27.8*  --  29.3*  PLT 284 210  --  260  LABPROT 16.2* 17.2*  --  22.8*  INR 1.31 1.43  --  2.04  CREATININE 2.30* 2.40* 2.64* 2.35*    Estimated Creatinine Clearance: 27.6 mL/min (A) (by C-G formula based on SCr of 2.35 mg/dL (H)).  Assessment: 26 yoF on warfarin PTA for AFib admitted with URI. INR 1.31 on admit, enoxaparin bridge initiated at 1mg /kg q24h. INR 2.04 today, H/H stable.  *PTA Warfarin Dose = 7.5mg  Mon/Wed/Fri; 5mg  all other days  Goal of Therapy:  INR 2-3 Monitor platelets by anticoagulation protocol: Yes   Plan:  -Warfarin 5mg  PO x1 tonight -Would stop enoxaparin as INR >2 -Daily protime  Arrie Senate, PharmD, BCPS Clinical Pharmacist [phone] Please check AMION for all [place] numbers 07/22/2018

## 2018-07-23 DIAGNOSIS — I5021 Acute systolic (congestive) heart failure: Secondary | ICD-10-CM

## 2018-07-23 DIAGNOSIS — N179 Acute kidney failure, unspecified: Secondary | ICD-10-CM

## 2018-07-23 DIAGNOSIS — K529 Noninfective gastroenteritis and colitis, unspecified: Secondary | ICD-10-CM

## 2018-07-23 LAB — BASIC METABOLIC PANEL
Anion gap: 10 (ref 5–15)
BUN: 30 mg/dL — ABNORMAL HIGH (ref 8–23)
CALCIUM: 7.7 mg/dL — AB (ref 8.9–10.3)
CO2: 18 mmol/L — AB (ref 22–32)
Chloride: 109 mmol/L (ref 98–111)
Creatinine, Ser: 2.58 mg/dL — ABNORMAL HIGH (ref 0.44–1.00)
GFR calc non Af Amer: 19 mL/min — ABNORMAL LOW (ref >60)
GFR, EST AFRICAN AMERICAN: 22 mL/min — AB (ref >60)
Glucose, Bld: 226 mg/dL — ABNORMAL HIGH (ref 70–99)
Potassium: 3.4 mmol/L — ABNORMAL LOW (ref 3.5–5.1)
Sodium: 137 mmol/L (ref 135–145)

## 2018-07-23 LAB — GLUCOSE, CAPILLARY
Glucose-Capillary: 188 mg/dL — ABNORMAL HIGH (ref 70–99)
Glucose-Capillary: 158 mg/dL — ABNORMAL HIGH (ref 70–99)

## 2018-07-23 LAB — PROTIME-INR
INR: 2.66
Prothrombin Time: 27.9 seconds — ABNORMAL HIGH (ref 11.4–15.2)

## 2018-07-23 MED ORDER — WARFARIN SODIUM 2.5 MG PO TABS: 2.5000 mg | ORAL_TABLET | Freq: Once | ORAL | Status: DC

## 2018-07-23 MED ORDER — CARVEDILOL 25 MG PO TABS: 12.5000 mg | ORAL_TABLET | Freq: Two times a day (BID) | ORAL | 0 refills | Status: DC

## 2018-07-23 NOTE — Progress Notes (Signed)
   Subjective: Feels great this morning. Denies dizziness, sob, chest pain. Ambulating in room without difficulty   Objective:  Vital signs in last 24 hours: Vitals:   07/22/18 1606 07/22/18 2026 07/23/18 0454 07/23/18 0924  BP: (!) 156/71 (!) 152/72 (!) 134/57 (!) 149/82  Pulse: (!) 52 (!) 54 (!) 53   Resp: 18 15 14    Temp: (!) 97.4 F (36.3 C) (!) 97.4 F (36.3 C) 97.9 F (36.6 C)   TempSrc: Oral Oral Oral   SpO2: 99% 98% 100%   Weight:   85.8 kg   Height:       Gen: sitting up in chair  Cardiac: RRR, no m/r/g Pulm: CTAB Ext: no LEE  Assessment/Plan:  Active Problems:   Paroxysmal atrial fibrillation with RVR ([facility])  1. Viral pneumonia/gastroenteritis: resolved. Denies sob, cough, chest pain. No more diarrhea or vomiting. Eating and drinking well. Ambulating without difficulty   2. A.fib: remains in sinus rhythm. HR remains in the upper 50s. Denies dizziness. Decreased home coreg to 12.5mg  bid   3. CHF: mild exacerbation 2/2 IVFs during hospital admission. Repeat echo showed dilated IVC but EF was unchanged from prior at 40-45%. Given one dose of 40mg  IV lasix yesterday. Euvolemic on exam.   4. HTN: hypertensive over the last 24hrs. Has been taking amlodipine, losartan, HCTZ. Given worsening renal function. Will discontinue losartan and resume home hydralazine.   5. AKI: baseline creatine 1.4-1.7. This admission, her creatine has been 2.3-2.6. Initially thought to be hypovolemic but creatine did not improve with IVFs. Echo showed dilated IVC so IV lasix was given but creatine worsened. Instructed her to discontinue losartan and f/u closely with PCP.  Dispo: Anticipated discharge in approximately today.   Isabelle Course, MD 07/23/2018, 11:51 AM Pager: [phone]

## 2018-07-23 NOTE — Progress Notes (Signed)
Called Dr. Donne Hazel regarding pt  And pt family question about respiratory isolation. Dr. Donne Hazel called and updated the family and pt.

## 2018-07-23 NOTE — Progress Notes (Signed)
ANTICOAGULATION CONSULT NOTE - Follow-Up Consult  Pharmacy Consult for warfarin Indication: atrial fibrillation  Patient Measurements Height: 5\' 6"  (167.6 cm) Weight: 189 lb 1.6 oz (85.8 kg) IBW/kg (Calculated) : 59.3  Vital Signs: Temp: 97.9 F (36.6 C) (12/14 0454) Temp Source: Oral (12/14 0454) BP: 149/82 (12/14 0924) Pulse Rate: 53 (12/14 0454)  Labs: Recent Labs    07/20/18 1142 07/21/18 0322 07/21/18 1607 07/22/18 0630 07/23/18 0448 07/23/18 1001  HGB 11.1* 8.8*  --  8.9*  --   --   HCT 37.0 27.8*  --  29.3*  --   --   PLT 284 210  --  260  --   --   LABPROT 16.2* 17.2*  --  22.8* 27.9*  --   INR 1.31 1.43  --  2.04 2.66  --   CREATININE 2.30* 2.40* 2.64* 2.35*  --  2.58*    Estimated Creatinine Clearance: 24.9 mL/min (A) (by C-G formula based on SCr of 2.58 mg/dL (H)).  Assessment: Amber Young on warfarin PTA for AFib admitted with URI. INR 1.31 on admit, enoxaparin bridge initiated at 1mg /kg q24h now off with therapeutic INR. INR today 2.66, trending up quickly.  *PTA Warfarin Dose = 7.5mg  Mon/Wed/Fri; 5mg  all other days  Goal of Therapy:  INR 2-3 Monitor platelets by anticoagulation protocol: Yes   Plan:  -Warfarin 2.5mg  PO x1 tonight given quick INR rise -Daily protime  Arrie Senate, PharmD, BCPS Clinical Pharmacist [phone] Please check AMION for all [place] numbers 07/23/2018

## 2018-07-24 LAB — LEGIONELLA PNEUMOPHILA SEROGP 1 UR AG: L. pneumophila Serogp 1 Ur Ag: NEGATIVE

## 2018-07-25 NOTE — Discharge Summary (Signed)
Name: Amber Young MRN: [id number] DOB: 30-Oct-1955 62 y.o. PCP: Azzie Glatter, FNP  Date of Admission: 07/20/2018 11:33 AM Date of Discharge: 07/23/2018 Attending Physician: No att. providers found  Discharge Diagnosis: 1. Viral pneumonia and gastroenteritis 2. A.fib with RVR 3. HFrEF 4. AKI  Discharge Medications: Allergies as of 07/23/2018      Reactions   Ace Inhibitors Cough      Medication List    STOP taking these medications   losartan 100 MG tablet Commonly known as:  COZAAR     TAKE these medications   amLODipine 10 MG tablet Commonly known as:  NORVASC Take 1 tablet (10 mg total) by mouth daily.   atorvastatin 40 MG tablet Commonly known as:  LIPITOR Take 1 tablet (40 mg total) by mouth daily at 6 PM.   BLOOD GLUCOSE TEST STRIPS Strp Use as directed   carvedilol 25 MG tablet Commonly known as:  COREG Take 0.5 tablets (12.5 mg total) by mouth 2 (two) times daily. What changed:  how much to take   ferrous sulfate 325 (65 FE) MG tablet Commonly known as:  FERROUSUL Take 1 tablet (325 mg total) by mouth every other day.   furosemide 40 MG tablet Commonly known as:  LASIX Take 1 tablet (40 mg total) by mouth daily.   hydrALAZINE 50 MG tablet Commonly known as:  APRESOLINE Take 1.5 tablets (75 mg total) by mouth 3 (three) times daily. What changed:  how much to take   hydrochlorothiazide 25 MG tablet Commonly known as:  HYDRODIURIL Take 1 tablet (25 mg total) by mouth daily.   insulin glargine 100 UNIT/ML injection Commonly known as:  LANTUS Inject 0.2 mLs (20 Units total) into the skin 2 (two) times daily.   Insulin Pen Needle 29G X 10MM Misc Use as directed   TRUEPLUS PEN NEEDLES 31G X 6 MM Misc Generic drug:  Insulin Pen Needle USE AS DIRECTED   INSULIN SYRINGE .5CC/29G 29G X 1/2" 0.5 ML Misc Use to administer insulin three times daily   ketorolac 0.5 % ophthalmic solution Commonly known as:  ACULAR Place 1 drop into the  right eye 4 (four) times daily.   nitroGLYCERIN 0.4 MG SL tablet Commonly known as:  NITROSTAT Place 1 tablet (0.4 mg total) under the tongue every 5 (five) minutes x 3 doses as needed for chest pain.   potassium chloride 10 MEQ tablet Commonly known as:  K-DUR,KLOR-CON Take 1 tablet with each dose of furosemide, daily.   ticagrelor 90 MG Tabs tablet Commonly known as:  BRILINTA Take 1 tablet (90 mg total) by mouth 2 (two) times daily.   warfarin 5 MG tablet Commonly known as:  COUMADIN Take as directed. If you are unsure how to take this medication, talk to your nurse or doctor. Original instructions:  Take 1 to 1.5 tablets by mouth daily as directed by coumadin clinic What changed:    how much to take  how to take this  when to take this  additional instructions       Disposition and follow-up:   Ms.Amber Young was discharged from [facility] in Stable condition.  At the hospital follow up visit please address:  1.  Please work up worsening renal function and review medications.       She was discharged with [facility] RN for medication help      Losartan was held at discharge and coreg was decreased to 12.5mg  bid  2.  Labs /  imaging needed at time of follow-up: BMP (evaluate renal function)  3.  Pending labs/ test needing follow-up: none  Follow-up Appointments: Follow-up Information    [facility] Follow [facility].   Specialty:  Home Health Services Why:  also known as [facility] will call you in the next 1-2 days to set up your first home appointent Contact information: [place] [address] [phone]           Hospital Course by problem list: 62yo female with DM2, HTN, HLD, GERD, CAD s/p PCI w/ stents to LAD/RCA, CKD3, CHF (EF 40-45%, g3dd), anemia, a.fib on coumadin, who presents with7 days of URI symptoms (rhinorrhea, dry cough, congestion),palpitations, dizziness, sob, wheezing, fatigue, decreased po  intake, n/v/d found to be in a.fib with RVR and possible left lower lobe pneumonia.  1. Viral Pneumonia, gastroenteritis: Tested positive for RSV. She was treated supportively with IVFs, leading to a mild CHF exacerbation for which she was given one dose of IV lasix. By time of discharge, her cough, diarrhea, nausea, vomiting had resolved and she was afebrile.   2. A.fib with RVR: rate controlled with diltiazem in the ED and then home coreg. She self converted back to sinus rhythm by day 2 of hospitalization. She was discharged on a lower dose of coreg due to bradycardia.   3. Hypotension: She was hypovolemic on admission. Home BP meds were held and then resumed at lower doses. Losartan was discontinued given worsening renal function.   4. AKI: creatine during admission was 2.3-2.6 which is up from her baseline of 1.4-1.7. This did not respond to IVFs or diuresis. Urine output was adequate. Her losartan was held at discharge and she was instructed to schedule close PCP follow up.   Discharge Vitals:   BP (!) 149/82   Pulse (!) 53   Temp 97.9 F (36.6 C) (Oral)   Resp 14   Ht 5\' 6"  (1.676 m)   Wt 85.8 kg   SpO2 100%   BMI 30.52 kg/m   Pertinent Labs, Studies, and Procedures:  BMP Latest Ref Rng & Units 07/23/2018 07/22/2018 07/21/2018  Glucose 70 - 99 mg/dL 226(H) 69(L) 111(H)  BUN 8 - 23 mg/dL 30(H) 34(H) 37(H)  Creatinine 0.44 - 1.00 mg/dL 2.58(H) 2.35(H) 2.64(H)  BUN/Creat Ratio 6 - 22 (calc) - - -  Sodium 135 - 145 mmol/L 137 140 135  Potassium 3.5 - 5.1 mmol/L 3.4(L) 3.3(L) 4.0  Chloride 98 - 111 mmol/L 109 108 108  CO2 22 - 32 mmol/L 18(L) 17(L) 18(L)  Calcium 8.9 - 10.3 mg/dL 7.7(L) 7.7(L) 7.5(L)    EXAM: CHEST - 2 VIEW  COMPARISON:  November 17, 2017  FINDINGS: There is patchy opacity in the left lower lobe. Lungs elsewhere are clear. Heart is upper normal in size with pulmonary vascularity normal. There are coronary stents on the left. There is  aortic atherosclerosis. There is mild degenerative change in the thoracic spine.  IMPRESSION: Patchy infiltrate left lower lobe, felt to represent pneumonia. Lungs elsewhere clear. Heart upper normal in size. Foci of aortic atherosclerosis noted.  Aortic Atherosclerosis (ICD10-I70.0).  Followup PA and lateral chest radiographs recommended in 3-4 weeks following trial of antibiotic therapy to ensure resolution and exclude underlying malignancy.   ------------------------------------------------------------------- LV EF: 40% -   45%  ------------------------------------------------------------------- Indications:      Atrial fibrillation - 427.31.  ------------------------------------------------------------------- History:   PMH:   Coronary artery disease.  Congestive heart failure.  Stroke.  Risk factors:  Hypertension. Diabetes mellitus. Dyslipidemia.  ------------------------------------------------------------------- Study Conclusions  - Left ventricle: The cavity size was normal. Wall thickness was   increased in a pattern of mild LVH. Systolic function was mildly   to moderately reduced. The estimated ejection fraction was in the   range of 40% to 45%. Severe anteroseptal apical and inferoapical   akinesis. No apical thrombus was noted. The study is not   technically sufficient to allow evaluation of LV diastolic   function. - Mitral valve: Mildly thickened leaflets . There was moderate   regurgitation. - Left atrium: Moderately dilated. - Right ventricle: The cavity size was mildly dilated. Mild   systolic dysfunction. - Right atrium: The atrium was at the upper limits of normal in   size. - Tricuspid valve: There was mild regurgitation. - Pulmonary arteries: PA peak pressure: 29 mm Hg (S). - Inferior vena cava: The vessel was dilated. The respirophasic   diameter changes were blunted (< 50%), consistent with elevated   central venous  pressure.  Impressions:  - Compared to a prior study in 08/2017, there are no significant   changes.  Discharge Instructions: Discharge Instructions    Diet - low sodium heart healthy   Complete by:  As directed    Discharge instructions   Complete by:  As directed    Ms. Marquasha, Brutus were admitted to the hospital because your heart was in a funny rhythm called A. fib and it was making you feel short of breath.  You also had a viral infection that was causing GI symptoms and respiratory symptoms.  We were able to get your heart back into a normal rhythm and kept you hydrated. Your kidney function is worse than it used to be so it will be important to follow up with your regular doctor in the next 1-2 weeks and get it rechecked. In the meantime, please stop taking losartan and decrease your carvedilol (coreg) to 12.5mg  (or half a pill) in the morning and half a pill at night.   I have arranged for a home health nurse to make home visits and assist you with your medications so you don't get confused about what to take.   If you experience worsening trouble breathing, dizziness, or passing out please come back to the hospital   Increase activity slowly   Complete by:  As directed       Signed: Isabelle Course, MD 07/25/2018, 5:10 PM   Pager: [phone]

## 2018-07-26 ENCOUNTER — Encounter: Payer: Self-pay | Admitting: Family Medicine

## 2018-07-26 ENCOUNTER — Telehealth: Payer: Self-pay

## 2018-07-26 ENCOUNTER — Ambulatory Visit (INDEPENDENT_AMBULATORY_CARE_PROVIDER_SITE_OTHER): Payer: BLUE CROSS/BLUE SHIELD | Admitting: Family Medicine

## 2018-07-26 VITALS — BP 174/70 | HR 62 | Temp 97.5°F | Ht 66.0 in | Wt 195.0 lb

## 2018-07-26 DIAGNOSIS — R600 Localized edema: Secondary | ICD-10-CM

## 2018-07-26 DIAGNOSIS — H547 Unspecified visual loss: Secondary | ICD-10-CM

## 2018-07-26 DIAGNOSIS — R609 Edema, unspecified: Secondary | ICD-10-CM | POA: Diagnosis not present

## 2018-07-26 DIAGNOSIS — Z09 Encounter for follow-up examination after completed treatment for conditions other than malignant neoplasm: Secondary | ICD-10-CM | POA: Diagnosis not present

## 2018-07-26 DIAGNOSIS — R42 Dizziness and giddiness: Secondary | ICD-10-CM

## 2018-07-26 DIAGNOSIS — I10 Essential (primary) hypertension: Secondary | ICD-10-CM | POA: Diagnosis not present

## 2018-07-26 MED ORDER — FUROSEMIDE 40 MG PO TABS: 40.0000 mg | ORAL_TABLET | Freq: Every day | ORAL | 1 refills | Status: DC

## 2018-07-26 MED ORDER — NITROGLYCERIN 0.4 MG SL SUBL: 0.4000 mg | SUBLINGUAL_TABLET | SUBLINGUAL | 12 refills | Status: DC | PRN

## 2018-07-26 NOTE — Progress Notes (Signed)
Hospital Follow Up  Subjective:    Patient ID: Amber Young, female    DOB: 12-18-1955, 62 y.o.   MRN: [id number]   Chief Complaint  Patient presents with  . Follow-up    HTN   HPI  Amber Young is a 62 year old female with a past medical history of Diabetes, Stroke, Proliferative Diabetic Retinopathy, Hypertension, Hyperlipidemia, Heart Murmur, GERD, CAD, CKD, CHF and Anemia. She is here today for hospital follow up.   Current Status: Since her last office visit, she had a hospital admission for Paroxysmal Atrial Fibrillation with RVR, from 07/20/2018-07/23/2018. She states that she is doing well and feeling much better since her discharge. She has has a mild cough. She denies visual changes, chest pain, and falls. She has occasionally headaches and dizziness with position changes. Denies severe headaches, confusion, seizures, double vision, and blurred vision, nausea and vomiting. She continues to have mild bilateral lower extremity edema. She has a appointment with her Cardiologist on 07/27/2018. She continues to blood glucose which range from 89-100 and 135-165, and she injects Novolog if > 125. She denies fatigue, frequent urination, blurred vision, excessive hunger, excessive thirst, weight gain, weight loss, and poor wound healing.   She denies fevers, chills, fatigue, recent infections, weight loss, and night sweats. No reports of GI problems such as diarrhea, and constipation. She has no reports of blood in stools, dysuria and hematuria. No depression or anxiety reported. She denies pain today.   Review of Systems  Constitutional: Negative.   HENT: Negative.   Eyes: Negative.   Respiratory: Negative.   Cardiovascular: Negative.   Gastrointestinal: Negative.   Endocrine: Negative.   Genitourinary: Negative.   Musculoskeletal: Negative.   Skin: Negative.   Allergic/Immunologic: Negative.   Neurological: Positive for dizziness and headaches.  Hematological: Negative.    Psychiatric/Behavioral: Negative.    Objective:   Physical Exam Vitals signs and nursing note reviewed.  Constitutional:      Appearance: Normal appearance. She is normal weight.  HENT:     Head: Normocephalic and atraumatic.     Right Ear: Tympanic membrane, ear canal and external ear normal.     Left Ear: Tympanic membrane, ear canal and external ear normal.     Nose: Nose normal.     Mouth/Throat:     Mouth: Mucous membranes are moist.     Pharynx: Oropharynx is clear.  Eyes:     Extraocular Movements: Extraocular movements intact.     Conjunctiva/sclera: Conjunctivae normal.     Pupils: Pupils are equal, round, and reactive to light.  Neck:     Musculoskeletal: Normal range of motion and neck supple.  Cardiovascular:     Rate and Rhythm: Normal rate and regular rhythm.     Pulses: Normal pulses.     Heart sounds: Normal heart sounds.  Pulmonary:     Effort: Pulmonary effort is normal.     Breath sounds: Normal breath sounds.  Abdominal:     General: Bowel sounds are normal.     Palpations: Abdomen is soft.  Musculoskeletal: Normal range of motion.  Skin:    General: Skin is warm and dry.  Neurological:     General: No focal deficit present.     Mental Status: She is alert and oriented to person, place, and time.  Psychiatric:        Thought Content: Thought content normal.        Judgment: Judgment normal.    Assessment & Plan:  1. Hospital discharge follow-up  2. Essential hypertension Blood pressures elevated. She has appointment with Cardiologist tomorrow. She will continue antihypertensive medications as prescribed. She will continue to decrease high sodium intake, excessive alcohol intake, increase potassium intake, smoking cessation, and increase physical activity of at least 30 minutes of cardio activity daily. She will continue to follow Heart Healthy or DASH diet. - POCT urinalysis dipstick - nitroGLYCERIN (NITROSTAT) 0.4 MG SL tablet; Place 1 tablet  (0.4 mg total) under the tongue every 5 (five) minutes x 3 doses as needed for chest pain.  Dispense: 30 tablet; Refill: 12 - furosemide (LASIX) 40 MG tablet; Take 1 tablet (40 mg total) by mouth daily.  Dispense: 90 tablet; Refill: 1  3. Visual problems Cataracts. She continues to follow up Optometry for eye care treatments.   4. Edema, peripheral - furosemide (LASIX) 40 MG tablet; Take 1 tablet (40 mg total) by mouth daily.  Dispense: 90 tablet; Refill: 1  5. Dizziness Improved. She will continue to ambulate carefully.   6. Follow up She will follow up in 2 months.   Meds ordered this encounter  Medications  . nitroGLYCERIN (NITROSTAT) 0.4 MG SL tablet    Sig: Place 1 tablet (0.4 mg total) under the tongue every 5 (five) minutes x 3 doses as needed for chest pain.    Dispense:  30 tablet    Refill:  12  . furosemide (LASIX) 40 MG tablet    Sig: Take 1 tablet (40 mg total) by mouth daily.    Dispense:  90 tablet    Refill:  [place],  MSN, FNP-C Patient [place] [address] [place], [place] [address] [phone]

## 2018-07-27 ENCOUNTER — Ambulatory Visit: Payer: BLUE CROSS/BLUE SHIELD | Admitting: Cardiovascular Disease

## 2018-08-10 HISTORY — PX: CATARACT EXTRACTION: SUR2

## 2018-08-17 NOTE — Progress Notes (Deleted)
Cardiology Office Note    Date:  08/17/2018   ID:  Amber Young, DOB 04-Sep-1955, MRN [id number]  PCP:  Azzie Glatter, FNP  Cardiologist: Kate Sable, MD EPS: None  No chief complaint on file.   History of Present Illness:  Amber Young is a 63 y.o. female with history of CAD status post DES to the RCA times 21/29/19 with residual 40% LAD, previous LAD stent patent therapy recommended.  LVEF 40 to 45% on echo 08/2017 with grade 3 DD, chronic systolic and diastolic CHF, hypertension, HLD, DM, CVA.  Last saw Rosaria Ferries, PA-C 05/20/2017 complaining of dizziness from most medications including Brilinta.  She was in heart failure and Lasix increased to 40 mg twice daily for 3 days.  Was hospitalized 07/2018 with viral pneumonia gastroenteritis and atrial fibrillation with RVR.  Was not seen by our group.  Losartan 100 mg daily was stopped and amlodipine 10 mg daily was started because of AKI creatinine of 2.3-2.6 above her baseline of 1.4-1.7.  She did not respond to IV fluids or diuresis.  Carvedilol dose decreased because of bradycardia when she converted to normal sinus rhythm  Past Medical History:  Diagnosis Date  . Anemia   . CHF (congestive heart failure) ([place])   . CKD (chronic kidney disease), stage III ([place])   . Coronary artery disease   . GERD (gastroesophageal reflux disease)   . Heart murmur   . Hyperlipidemia   . Hypertension   . 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 Stroke ([place]) 09/2014   numbness left upper lip, finger tips on left hand; "resolved" (05/18/2016)  . Type II diabetes mellitus ([place])   . Wears glasses     Past Surgical History:  Procedure Laterality Date  . CARDIAC CATHETERIZATION N/A 05/18/2016   Procedure: Left Heart Cath and Coronary Angiography;  Surgeon: Jettie Booze, MD;  Location: [facility];  Service: Cardiovascular;  Laterality: N/A;  .  CARDIAC CATHETERIZATION N/A 05/18/2016   Procedure: Coronary Stent Intervention;  Surgeon: Jettie Booze, MD;  Location: [place];  Service: Cardiovascular;  Laterality: N/A;  . COLONOSCOPY W/ BIOPSIES AND POLYPECTOMY    . CORONARY ANGIOPLASTY    . CORONARY ANGIOPLASTY WITH STENT PLACEMENT    . CORONARY STENT INTERVENTION N/A 09/07/2017   Procedure: CORONARY STENT INTERVENTION;  Surgeon: Leonie Man, MD;  Location: [facility];  Service: Cardiovascular;  Laterality: N/A;  . EYE SURGERY    . GAS INSERTION Left 01/13/2018   Procedure: INSERTION OF GAS;  Surgeon: Bernarda Caffey, MD;  Location: [place];  Service: Ophthalmology;  Laterality: Left;  . LEFT HEART CATH AND CORONARY ANGIOGRAPHY N/A 09/07/2017   Procedure: LEFT HEART CATH AND CORONARY ANGIOGRAPHY;  Surgeon: Leonie Man, MD;  Location: [place];  Service: Cardiovascular;  Laterality: N/A;  . MEMBRANE PEEL Left 01/13/2018   Procedure: MEMBRANE PEEL LEFT EYE ;  Surgeon: Bernarda Caffey, MD;  Location: [place];  Service: Ophthalmology;  Laterality: Left;  Marland Kitchen MULTIPLE TOOTH EXTRACTIONS    . PARS PLANA VITRECTOMY Left 01/13/2018   Procedure: PARS PLANA VITRECTOMY WITH 25 GAUGE LEFT EYE WITH ENDOLASER;  Surgeon: Bernarda Caffey, MD;  Location: [facility];  Service: Ophthalmology;  Laterality: Left;  . TUBAL LIGATION  1980  . VAGINAL HYSTERECTOMY  1999   "partial; fibroids"    Current Medications: No outpatient medications have been marked as  taking for the 08/22/18 encounter (Appointment) with Imogene Burn, PA-C.   Current Facility-Administered Medications for the 08/22/18 encounter (Appointment) with Imogene Burn, PA-C  Medication  . aflibercept (EYLEA) SOLN 2 mg  . aflibercept (EYLEA) SOLN 2 mg  . aflibercept (EYLEA) SOLN 2 mg  . aflibercept (EYLEA) SOLN 2 mg  . Bevacizumab (AVASTIN) SOLN 1.25 mg  . Bevacizumab (AVASTIN) SOLN 1.25 mg  . Bevacizumab (AVASTIN) SOLN 1.25 mg  . Bevacizumab (AVASTIN) SOLN 1.25  mg  . Bevacizumab (AVASTIN) SOLN 1.25 mg     Allergies:   Ace inhibitors   Social History   Socioeconomic History  . Marital status: Married    Spouse name: Not on file  . Number of children: Not on file  . Years of education: 28  . Highest education level: Not on file  Occupational History  . Occupation: hairdresser  Social Needs  . Financial resource strain: Not on file  . Food insecurity:    Worry: Not on file    Inability: Not on file  . Transportation needs:    Medical: Not on file    Non-medical: Not on file  Tobacco Use  . Smoking status: Never Smoker  . Smokeless tobacco: Never Used  Substance and Sexual Activity  . Alcohol use: No    Alcohol/week: 0.0 standard drinks  . Drug use: No  . Sexual activity: Yes  Lifestyle  . Physical activity:    Days per week: Not on file    Minutes per session: Not on file  . Stress: Not on file  Relationships  . Social connections:    Talks on phone: Not on file    Gets together: Not on file    Attends religious service: Not on file    Active member of club or organization: Not on file    Attends meetings of clubs or organizations: Not on file    Relationship status: Not on file  Other Topics Concern  . Not on file  Social History Narrative   Married, 2 children, hairdresser   Caffeine use- occasional tea or coffee   Right handed     Family History:  The patient's ***family history includes COPD in her mother; Diabetes in her mother; Heart failure in her mother; Hypertension in her brother, mother, and sister.   ROS:   Please see the history of present illness.    ROS All other systems reviewed and are negative.   PHYSICAL EXAM:   VS:  There were no vitals taken for this visit.  Physical Exam  GEN: Well nourished, well developed, in no acute distress  HEENT: normal  Neck: no JVD, carotid bruits, or masses Cardiac:RRR; no murmurs, rubs, or gallops  Respiratory:  clear to auscultation bilaterally, normal work of  breathing GI: soft, nontender, nondistended, + BS Ext: without cyanosis, clubbing, or edema, Good distal pulses bilaterally MS: no deformity or atrophy  Skin: warm and dry, no rash Neuro:  Alert and Oriented x 3, Strength and sensation are intact Psych: euthymic mood, full affect  Wt Readings from Last 3 Encounters:  07/26/18 195 lb (88.5 kg)  07/23/18 189 lb 1.6 oz (85.8 kg)  07/20/18 198 lb 4.8 oz (89.9 kg)      Studies/Labs Reviewed:   EKG:  EKG is*** ordered today.  The ekg ordered today demonstrates ***  Recent Labs: 01/24/2018: TSH 1.730 07/20/2018: B Natriuretic Peptide 918.5 07/21/2018: ALT 21 07/22/2018: Hemoglobin 8.9; Platelets 260 07/23/2018: BUN 30; Creatinine, Ser 2.58; Potassium  3.4; Sodium 137   Lipid Panel    Component Value Date/Time   CHOL 190 01/05/2017 1146   TRIG 74 01/05/2017 1146   HDL 80 01/05/2017 1146   CHOLHDL 2.4 01/05/2017 1146   VLDL 15 01/05/2017 1146   LDLCALC 95 01/05/2017 1146    Additional studies/ records that were reviewed today include:   Study Conclusions   - Left ventricle: The cavity size was normal. Wall thickness was   increased increased in a pattern of mild to moderate LVH.   Systolic function was mildly to moderately reduced. The estimated   ejection fraction was in the range of 40% to 45%. Doppler   parameters are consistent with restrictive physiology, indicative   of decreased left ventricular diastolic compliance and/or   increased left atrial pressure. - Regional wall motion abnormality: Hypokinesis of the mid   anterior, basal-mid anteroseptal, and mid anterolateral   myocardium. - Aortic valve: Mildly calcified annulus. Trileaflet; mildly   thickened leaflets. Valve area (VTI): 2.72 cm^2. Valve area   (Vmax): 2.64 cm^2. Valve area (Vmean): 2.23 cm^2. - Mitral valve: Mildly calcified annulus. Mildly thickened leaflets   . There was mild regurgitation. - Left atrium: The atrium was moderately dilated. - Right  ventricle: Systolic function was mildly reduced. - Right atrium: The atrium was mildly dilated. - Atrial septum: No defect or patent foramen ovale was identified. - Tricuspid valve: There was moderate regurgitation. - Pulmonary arteries: Systolic pressure was mildly increased. PA   peak pressure: 36 mm Hg (S). - Technically adequate study.   Cath results (05/18/16):  Prox RCA lesion, 50 %stenosed. Some catheter dampening noted at times but this may have been related to catheter induced spasm based on the angiogram.  Ost RCA lesion, 50 %stenosed.  Ost 2nd Diag to 2nd Diag lesion, 80 %stenosed.  Mid LAD-1 lesion, 25 %stenosed.  1st Diag lesion, 25 %stenosed.  Mid LAD-2 lesion, 80 %stenosed. A STENT PROMUS PREM MR [id number] drug eluting stent was successfully placed, and overlaps previously placed stent, postdilated to 3.25 mm.  Post intervention, there is a 0% residual stenosis.  The left ventricular ejection fraction is 40-45% by visual estimate.  There is mild left ventricular systolic dysfunction.  LV end diastolic pressure is moderately elevated.   Dual antiplatelet therapy for 12 months.  Continue aggressive secondary prevention.  During the procedure, the patient had paroxysmal SVT episodes.      ASSESSMENT:    No diagnosis found.   PLAN:  In order of problems listed above:  Chronic combined systolic and diastolic CHF ejection fraction 40 to 45% on echo 08/2017-losartan stopped in the hospital because of AKI in the setting of pneumonia and hypovolemia that did not respond to IV fluids  CAD status post DES to the mid LAD 05/2016 on Brilinta and Coumadin  Ischemic cardiomyopathy ejection fraction 40 to 45%  PAF on Coumadin CHA2DS2-VASc equals 7 on Coreg.  Fast rates treated with IV diltiazem but Coreg reduced because of bradycardia when she converted to normal sinus rhythm   History of CVA 2016  Medication Adjustments/Labs and Tests Ordered: Current medicines are  reviewed at length with the patient today.  Concerns regarding medicines are outlined above.  Medication changes, Labs and Tests ordered today are listed in the Patient Instructions below. There are no Patient Instructions on file for this visit.   Signed, Ermalinda Barrios, PA-C  08/17/2018 3:07 PM    [facility] [place], [place]  [address] Phone: [phone]; Fax: [phone]

## 2018-08-22 ENCOUNTER — Ambulatory Visit: Payer: BLUE CROSS/BLUE SHIELD | Admitting: Physician Assistant

## 2018-08-23 ENCOUNTER — Encounter: Payer: Self-pay | Admitting: Physician Assistant

## 2018-08-25 ENCOUNTER — Other Ambulatory Visit: Payer: Self-pay | Admitting: Family Medicine

## 2018-08-25 ENCOUNTER — Other Ambulatory Visit: Payer: Self-pay

## 2018-08-25 DIAGNOSIS — R609 Edema, unspecified: Secondary | ICD-10-CM

## 2018-08-25 DIAGNOSIS — H547 Unspecified visual loss: Secondary | ICD-10-CM

## 2018-08-25 MED ORDER — WARFARIN SODIUM 5 MG PO TABS: ORAL_TABLET | ORAL | 1 refills | Status: DC

## 2018-08-25 MED ORDER — HYDROCHLOROTHIAZIDE 25 MG PO TABS: 25.0000 mg | ORAL_TABLET | Freq: Every day | ORAL | 2 refills | Status: DC

## 2018-08-25 MED ORDER — AMLODIPINE BESYLATE 10 MG PO TABS: 10.0000 mg | ORAL_TABLET | Freq: Every day | ORAL | 2 refills | Status: DC

## 2018-08-25 MED FILL — NITROGLYCERIN 0.4 MG TAB SL: 0.4 | 25 days supply | Qty: 25 | Fill #0

## 2018-08-25 MED FILL — AMLODIPINE BESYLATE 10 MG T: 10 | 90 days supply | Qty: 90 | Fill #0

## 2018-08-25 MED FILL — WARFARIN SODIUM 5 MG TABLET: 5 | 30 days supply | Qty: 45 | Fill #0

## 2018-08-25 NOTE — Telephone Encounter (Signed)
Medication sent to pharmacy  

## 2018-08-26 MED FILL — HYDROCHLOROTHIAZIDE 25 MG T: 25 | 30 days supply | Qty: 30 | Fill #0

## 2018-09-26 ENCOUNTER — Ambulatory Visit: Payer: BLUE CROSS/BLUE SHIELD | Admitting: Family Medicine

## 2018-10-26 ENCOUNTER — Ambulatory Visit (INDEPENDENT_AMBULATORY_CARE_PROVIDER_SITE_OTHER): Payer: Medicaid Other | Admitting: Family Medicine

## 2018-10-26 ENCOUNTER — Encounter: Payer: Self-pay | Admitting: Family Medicine

## 2018-10-26 ENCOUNTER — Other Ambulatory Visit: Payer: Self-pay

## 2018-10-26 ENCOUNTER — Other Ambulatory Visit: Payer: Self-pay | Admitting: Family Medicine

## 2018-10-26 VITALS — BP 142/76 | HR 68 | Temp 98.4°F | Ht 66.0 in | Wt 187.0 lb

## 2018-10-26 DIAGNOSIS — H547 Unspecified visual loss: Secondary | ICD-10-CM

## 2018-10-26 DIAGNOSIS — R7309 Other abnormal glucose: Secondary | ICD-10-CM | POA: Diagnosis not present

## 2018-10-26 DIAGNOSIS — E876 Hypokalemia: Secondary | ICD-10-CM

## 2018-10-26 DIAGNOSIS — N6320 Unspecified lump in the left breast, unspecified quadrant: Secondary | ICD-10-CM

## 2018-10-26 DIAGNOSIS — Z09 Encounter for follow-up examination after completed treatment for conditions other than malignant neoplasm: Secondary | ICD-10-CM

## 2018-10-26 DIAGNOSIS — E119 Type 2 diabetes mellitus without complications: Secondary | ICD-10-CM

## 2018-10-26 DIAGNOSIS — Z794 Long term (current) use of insulin: Secondary | ICD-10-CM

## 2018-10-26 LAB — POCT URINALYSIS DIP (MANUAL ENTRY)
Bilirubin, UA: NEGATIVE
Glucose, UA: 500 mg/dL — AB
Ketones, POC UA: NEGATIVE mg/dL
Leukocytes, UA: NEGATIVE
Nitrite, UA: NEGATIVE
Protein Ur, POC: >=300 mg/dL — AB
Spec Grav, UA: 1.02 (ref 1.010–1.025)
Urobilinogen, UA: 0.2 E.U./dL
pH, UA: 5.5 (ref 5.0–8.0)

## 2018-10-26 LAB — POCT GLYCOSYLATED HEMOGLOBIN (HGB A1C): Hemoglobin A1C: 11.7 % — AB (ref 4.0–5.6)

## 2018-10-26 LAB — GLUCOSE, POCT (MANUAL RESULT ENTRY)
POC Glucose: 370 mg/dl — AB (ref 70–99)
POC Glucose: 370 mg/dl — AB (ref 70–99)

## 2018-10-26 MED ORDER — POTASSIUM CHLORIDE CRYS ER 10 MEQ PO TBCR: EXTENDED_RELEASE_TABLET | ORAL | 3 refills | Status: DC

## 2018-10-26 MED ORDER — INSULIN LISPRO 100 UNIT/ML ~~LOC~~ SOLN
20.0000 [IU] | Freq: Once | SUBCUTANEOUS | Status: AC
  Administered 2018-10-26: 20 [IU] via SUBCUTANEOUS

## 2018-10-26 MED ORDER — KETOROLAC TROMETHAMINE 0.5 % OP SOLN: 1.0000 [drp] | Freq: Four times a day (QID) | OPHTHALMIC | 0 refills | Status: AC

## 2018-10-26 MED FILL — KETOROLAC 0.5% OPHTH SOLUTI: 0.5 | 25 days supply | Qty: 5 | Fill #0

## 2018-10-26 MED FILL — POTASSIUM CHLORIDE ER 10 ME: 10 | 30 days supply | Qty: 30 | Fill #0

## 2018-10-26 NOTE — Progress Notes (Signed)
Patient [facility]  Established Patient Office Visit  Subjective:  Patient ID: Amber Young, female    DOB: 1956/05/31  Age: 63 y.o. MRN: [id number]  CC:  Chief Complaint  Patient presents with  . Follow-up    chronic condition   . Breast Mass    left    HPI Amber Young is a 63 year old who presents for Follow Up today.    Past Medical History:  Diagnosis Date  . Anemia   . CHF (congestive heart failure) ([place])   . CKD (chronic kidney disease), stage III ([place])   . Coronary artery disease   . GERD (gastroesophageal reflux disease)   . Heart murmur   . Hyperlipidemia   . Hypertension   . Proliferative diabetic retinopathy ([place])    left eye with vitreous hemorrhage and tractional retinal detachment  . Stroke ([place]) 09/2014   numbness left upper lip, finger tips on left hand; "resolved" (05/18/2016)  . Type II diabetes mellitus ([employer])   . Wears glasses    Current Status: Since her last office visit, she is doing well with no complaints. She has recently palpated a 'knot' under her left breast. She states that she has had increased pain and discomfort in this area, which radiate throughout her breast. She has had history of 'boils' at surgery site located in mid chest and also in right breast, post cardiac catherization and stent placement on 05/18/2016. Patient was then lost to follow up. She continues to follow up with Cardiologist. She admits to occasional heart palpitations which usually occur at night. She states that to relieve discomfort, she ambulates or sets up in bed until they subside. She denies visual changes, chest pain, cough, shortness of breath, and falls. She has occasional headaches and dizziness with position changes. Denies severe headaches, confusion, seizures, double vision, and blurred vision, nausea and vomiting.  She denies fevers, chills, fatigue, recent infections, weight loss, and night sweats. No reports of  GI problems such as diarrhea, and constipation. She has no reports of blood in stools, dysuria and hematuria. No depression or anxiety reported. She denies pain today.   Past Surgical History:  Procedure Laterality Date  . CARDIAC CATHETERIZATION N/A 05/18/2016   Procedure: Left Heart Cath and Coronary Angiography;  Surgeon: Jettie Booze, MD;  Location: [facility];  Service: Cardiovascular;  Laterality: N/A;  . CARDIAC CATHETERIZATION N/A 05/18/2016   Procedure: Coronary Stent Intervention;  Surgeon: Jettie Booze, MD;  Location: [facility];  Service: Cardiovascular;  Laterality: N/A;  . COLONOSCOPY W/ BIOPSIES AND POLYPECTOMY    . CORONARY ANGIOPLASTY    . CORONARY ANGIOPLASTY WITH STENT PLACEMENT    . CORONARY STENT INTERVENTION N/A 09/07/2017   Procedure: CORONARY STENT INTERVENTION;  Surgeon: Leonie Man, MD;  Location: [facility];  Service: Cardiovascular;  Laterality: N/A;  . EYE SURGERY    . GAS INSERTION Left 01/13/2018   Procedure: INSERTION OF GAS;  Surgeon: Bernarda Caffey, MD;  Location: [facility];  Service: Ophthalmology;  Laterality: Left;  . LEFT HEART CATH AND CORONARY ANGIOGRAPHY N/A 09/07/2017   Procedure: LEFT HEART CATH AND CORONARY ANGIOGRAPHY;  Surgeon: Leonie Man, MD;  Location: [facility];  Service: Cardiovascular;  Laterality: N/A;  . MEMBRANE PEEL Left 01/13/2018   Procedure: MEMBRANE PEEL LEFT EYE ;  Surgeon: Bernarda Caffey, MD;  Location: [facility];  Service: Ophthalmology;  Laterality: Left;  .  MULTIPLE TOOTH EXTRACTIONS    . PARS PLANA VITRECTOMY Left 01/13/2018   Procedure: PARS PLANA VITRECTOMY WITH 25 GAUGE LEFT EYE WITH ENDOLASER;  Surgeon: Bernarda Caffey, MD;  Location: [facility];  Service: Ophthalmology;  Laterality: Left;  . TUBAL LIGATION  1980  . VAGINAL HYSTERECTOMY  1999   "partial; fibroids"    Family History  Problem Relation Age of Onset  . Diabetes Mother   . Hypertension Mother   . COPD Mother   . Heart  failure Mother   . Hypertension Sister   . Hypertension Brother     Social History   Socioeconomic History  . Marital status: Married    Spouse name: Not on file  . Number of children: Not on file  . Years of education: 65  . Highest education level: Not on file  Occupational History  . Occupation: hairdresser  Social Needs  . Financial resource strain: Not on file  . Food insecurity:    Worry: Not on file    Inability: Not on file  . Transportation needs:    Medical: Not on file    Non-medical: Not on file  Tobacco Use  . Smoking status: Never Smoker  . Smokeless tobacco: Never Used  Substance and Sexual Activity  . Alcohol use: No    Alcohol/week: 0.0 standard drinks  . Drug use: No  . Sexual activity: Yes  Lifestyle  . Physical activity:    Days per week: Not on file    Minutes per session: Not on file  . Stress: Not on file  Relationships  . Social connections:    Talks on phone: Not on file    Gets together: Not on file    Attends religious service: Not on file    Active member of club or organization: Not on file    Attends meetings of clubs or organizations: Not on file    Relationship status: Not on file  . Intimate partner violence:    Fear of current or ex partner: Not on file    Emotionally abused: Not on file    Physically abused: Not on file    Forced sexual activity: Not on file  Other Topics Concern  . Not on file  Social History Narrative   Married, 2 children, hairdresser   Caffeine use- occasional tea or coffee   Right handed    Outpatient Medications Prior to Visit  Medication Sig Dispense Refill  . amLODipine (NORVASC) 10 MG tablet Take 1 tablet (10 mg total) by mouth daily. 90 tablet 2  . atorvastatin (LIPITOR) 40 MG tablet Take 1 tablet (40 mg total) by mouth daily at 6 PM. 90 tablet 1  . carvedilol (COREG) 25 MG tablet Take 0.5 tablets (12.5 mg total) by mouth 2 (two) times daily. 30 tablet 0  . ferrous sulfate (FERROUSUL) 325 (65  FE) MG tablet Take 1 tablet (325 mg total) by mouth every other day. 90 tablet 3  . furosemide (LASIX) 40 MG tablet Take 1 tablet (40 mg total) by mouth daily. 90 tablet 1  . Glucose Blood (BLOOD GLUCOSE TEST STRIPS) STRP Use as directed 200 each 0  . hydrALAZINE (APRESOLINE) 50 MG tablet Take 1.5 tablets (75 mg total) by mouth 3 (three) times daily. 135 tablet 6  . hydrochlorothiazide (HYDRODIURIL) 25 MG tablet Take 1 tablet (25 mg total) by mouth daily. 30 tablet 2  . insulin glargine (LANTUS) 100 UNIT/ML injection Inject 0.2 mLs (20 Units total) into the skin 2 (  two) times daily. 20 mL 11  . Insulin Pen Needle 29G X 10MM MISC Use as directed 100 each 3  . INSULIN SYRINGE .5CC/29G 29G X 1/2" 0.5 ML MISC Use to administer insulin three times daily 200 each 5  . nitroGLYCERIN (NITROSTAT) 0.4 MG SL tablet Place 1 tablet (0.4 mg total) under the tongue every 5 (five) minutes x 3 doses as needed for chest pain. 30 tablet 12  . ticagrelor (BRILINTA) 90 MG TABS tablet Take 1 tablet (90 mg total) by mouth 2 (two) times daily. 180 tablet 3  . TRUEPLUS PEN NEEDLES 31G X 6 MM MISC USE AS DIRECTED 100 each 0  . warfarin (COUMADIN) 5 MG tablet Take 1 to 1.5 tablets by mouth daily as directed by coumadin clinic 120 tablet 1  . potassium chloride (K-DUR,KLOR-CON) 10 MEQ tablet Take 1 tablet with each dose of furosemide, daily. 30 tablet 3  . ketorolac (ACULAR) 0.5 % ophthalmic solution Place 1 drop into the right eye 4 (four) times daily. (Patient not taking: Reported on 10/26/2018) 10 mL 2   Facility-Administered Medications Prior to Visit  Medication Dose Route Frequency Provider Last Rate Last Dose  . aflibercept (EYLEA) SOLN 2 mg  2 mg Intravitreal  Bernarda Caffey, MD   2 mg at 03/31/18 1056  . aflibercept (EYLEA) SOLN 2 mg  2 mg Intravitreal  Bernarda Caffey, MD   2 mg at 04/28/18 1419  . aflibercept (EYLEA) SOLN 2 mg  2 mg Intravitreal  Bernarda Caffey, MD   2 mg at 05/27/18 0028  . aflibercept (EYLEA) SOLN 2  mg  2 mg Intravitreal  Bernarda Caffey, MD   2 mg at 06/27/18 0051  . Bevacizumab (AVASTIN) SOLN 1.25 mg  1.25 mg Intravitreal  Bernarda Caffey, MD   1.25 mg at 02/14/18 1150  . Bevacizumab (AVASTIN) SOLN 1.25 mg  1.25 mg Intravitreal  Bernarda Caffey, MD   1.25 mg at 03/31/18 1057  . Bevacizumab (AVASTIN) SOLN 1.25 mg  1.25 mg Intravitreal  Bernarda Caffey, MD   1.25 mg at 04/28/18 1419  . Bevacizumab (AVASTIN) SOLN 1.25 mg  1.25 mg Intravitreal  Bernarda Caffey, MD   1.25 mg at 05/27/18 0028  . Bevacizumab (AVASTIN) SOLN 1.25 mg  1.25 mg Intravitreal  Bernarda Caffey, MD   1.25 mg at 06/27/18 0051    Allergies  Allergen Reactions  . Ace Inhibitors Cough    ROS Review of Systems  Constitutional: Negative.   HENT: Negative.   Eyes: Negative.   Respiratory: Negative.   Cardiovascular: Negative.   Gastrointestinal: Negative.   Endocrine: Negative.   Genitourinary: Negative.   Musculoskeletal: Negative.        Left breast pain  Skin: Negative.   Allergic/Immunologic: Negative.   Neurological: Positive for dizziness and headaches.  Hematological: Negative.   Psychiatric/Behavioral: Negative.      Objective:    Physical Exam  Constitutional: She is oriented to person, place, and time. She appears well-developed and well-nourished.  HENT:  Head: Normocephalic and atraumatic.  Eyes: Conjunctivae are normal.  Neck: Normal range of motion. Neck supple.  Cardiovascular: Normal rate, regular rhythm, normal heart sounds and intact distal pulses.  Pulmonary/Chest: Effort normal and breath sounds normal.    Abdominal: Soft. Bowel sounds are normal.  Musculoskeletal: Normal range of motion.  Neurological: She is alert and oriented to person, place, and time. She has normal reflexes.  Skin: Skin is warm and dry.  Psychiatric: She has a normal mood and affect.  Her behavior is normal. Judgment and thought content normal.  Nursing note and vitals reviewed.   BP (!) 142/76 (BP Location: Left  Arm, Patient Position: Sitting, Cuff Size: Small)   Pulse 68   Temp 98.4 F (36.9 C) (Oral)   Ht 5\' 6"  (1.676 m)   Wt 187 lb (84.8 kg)   SpO2 98%   BMI 30.18 kg/m  Wt Readings from Last 3 Encounters:  10/26/18 187 lb (84.8 kg)  07/26/18 195 lb (88.5 kg)  07/23/18 189 lb 1.6 oz (85.8 kg)     Health Maintenance Due  Topic Date Due  . Hepatitis C Screening  1956/08/02  . PNEUMOCOCCAL POLYSACCHARIDE VACCINE AGE 45-64 HIGH RISK  04/09/1958  . OPHTHALMOLOGY EXAM  04/09/1966  . PAP SMEAR-Modifier  04/09/1977    There are no preventive care reminders to display for this patient.  Lab Results  Component Value Date   TSH 1.730 01/24/2018   Lab Results  Component Value Date   WBC 3.9 (L) 07/22/2018   HGB 8.9 (L) 07/22/2018   HCT 29.3 (L) 07/22/2018   MCV 73.1 (L) 07/22/2018   PLT 260 07/22/2018   Lab Results  Component Value Date   NA 137 07/23/2018   K 3.4 (L) 07/23/2018   CO2 18 (L) 07/23/2018   GLUCOSE 226 (H) 07/23/2018   BUN 30 (H) 07/23/2018   CREATININE 2.58 (H) 07/23/2018   BILITOT 0.6 07/21/2018   ALKPHOS 79 07/21/2018   AST 40 07/21/2018   ALT 21 07/21/2018   PROT 5.1 (L) 07/21/2018   ALBUMIN 1.8 (L) 07/21/2018   CALCIUM 7.7 (L) 07/23/2018   ANIONGAP 10 07/23/2018   Lab Results  Component Value Date   CHOL 190 01/05/2017   Lab Results  Component Value Date   HDL 80 01/05/2017   Lab Results  Component Value Date   LDLCALC 95 01/05/2017   Lab Results  Component Value Date   TRIG 74 01/05/2017   Lab Results  Component Value Date   CHOLHDL 2.4 01/05/2017   Lab Results  Component Value Date   HGBA1C 11.7 (A) 10/26/2018    Assessment & Plan:   1. Type 2 diabetes mellitus without complication, with long-term current use of insulin ([facility]) - POCT glycosylated hemoglobin (Hb A1C) - POCT urinalysis dipstick - POCT glucose (manual entry)  2. Elevated glucose Hgb A1c continues to be elevated today. She will increase insulin to 30 units BID.  Continue Insulin as prescribed.  She will continue to decrease foods/beverages high in sugars and carbs and follow Heart Healthy or DASH diet. Increase physical activity to at least 30 minutes cardio exercise daily.  - insulin lispro (HUMALOG) injection 20 Units - POCT glucose (manual entry)  3. Visual problems - ketorolac (ACULAR) 0.5 % ophthalmic solution; Place 1 drop into the right eye 4 (four) times daily for 30 days.  Dispense: 6 mL; Refill: 0  4. Hypokalemia - potassium chloride (K-DUR,KLOR-CON) 10 MEQ tablet; Take 1 tablet with each dose of furosemide, daily.  Dispense: 30 tablet; Refill: 3  5. Left breast mass     - MM DIAG BREAST TOMO UNI LEFT; Future  6. Follow up She will follow up in 3 months.   Meds ordered this encounter  Medications  . insulin lispro (HUMALOG) injection 20 Units  . ketorolac (ACULAR) 0.5 % ophthalmic solution    Sig: Place 1 drop into the right eye 4 (four) times daily for 30 days.    Dispense:  6 mL  Refill:  0  . potassium chloride (K-DUR,KLOR-CON) 10 MEQ tablet    Sig: Take 1 tablet with each dose of furosemide, daily.    Dispense:  30 tablet    Refill:  3    Orders Placed This Encounter  Procedures  . MM DIAG BREAST TOMO UNI LEFT  . POCT glycosylated hemoglobin (Hb A1C)  . POCT urinalysis dipstick  . POCT glucose (manual entry)  . POCT glucose (manual entry)    Referral Orders  No referral(s) requested today    Kathe Becton,  MSN, FNP-C Patient [address] [place], [place] [address] [phone]    Problem List Items Addressed This Visit      Other   Hypokalemia   Relevant Medications   potassium chloride (K-DUR,KLOR-CON) 10 MEQ tablet    Other Visit Diagnoses    Type 2 diabetes mellitus without complication, with long-term current use of insulin ([place])    -  Primary   Relevant Medications   insulin lispro (HUMALOG) injection 20 Units (Completed)   Other Relevant  Orders   POCT glycosylated hemoglobin (Hb A1C) (Completed)   POCT urinalysis dipstick (Completed)   POCT glucose (manual entry) (Completed)   Elevated glucose       Relevant Medications   insulin lispro (HUMALOG) injection 20 Units (Completed)   Other Relevant Orders   POCT glucose (manual entry) (Completed)   Visual problems       Relevant Medications   ketorolac (ACULAR) 0.5 % ophthalmic solution   Left breast mass       Relevant Orders   MM DIAG BREAST TOMO UNI LEFT   Follow up          Meds ordered this encounter  Medications  . insulin lispro (HUMALOG) injection 20 Units  . ketorolac (ACULAR) 0.5 % ophthalmic solution    Sig: Place 1 drop into the right eye 4 (four) times daily for 30 days.    Dispense:  6 mL    Refill:  0  . potassium chloride (K-DUR,KLOR-CON) 10 MEQ tablet    Sig: Take 1 tablet with each dose of furosemide, daily.    Dispense:  30 tablet    Refill:  3    Follow-up: Return in about 3 months (around 01/26/2019).    Azzie Glatter, FNP

## 2018-10-27 ENCOUNTER — Other Ambulatory Visit: Payer: Self-pay | Admitting: Family Medicine

## 2018-10-27 ENCOUNTER — Telehealth: Payer: Self-pay

## 2018-10-27 NOTE — Telephone Encounter (Signed)
Patient notified and will start new dose of medication. Patient will schedule 1 month follow up.

## 2018-10-27 NOTE — Telephone Encounter (Signed)
-----   Message from Azzie Glatter, [place] sent at 10/26/2018  7:45 PM EDT ----- Regarding: "Insulin dosage" Please inform patient that we will increase Insulin to 30 units BID. She should begin this new dosage now. Please schedule her for follow up in 1 month. Thank you.

## 2018-10-27 NOTE — Telephone Encounter (Signed)
-----   Message from Azzie Glatter, [place] sent at 10/26/2018  7:45 PM EDT ----- Regarding: "Insulin dosage" Please inform patient that we will increase Insulin to 30 units BID. She should begin this new dosage now. Please schedule her for follow up in 1 month. Thank you.

## 2018-10-27 NOTE — Telephone Encounter (Signed)
Left a vm for patient to callback 

## 2018-10-28 MED FILL — LANTUS 100 UNITS/ML VIAL: 100 | 25 days supply | Qty: 10 | Fill #0

## 2018-10-31 ENCOUNTER — Other Ambulatory Visit: Payer: Self-pay

## 2018-10-31 ENCOUNTER — Ambulatory Visit
Admission: RE | Admit: 2018-10-31 | Discharge: 2018-10-31 | Disposition: A | Discharge status: Home / Self Care | Payer: Medicaid Other | Source: Ambulatory Visit | Attending: Family Medicine | Admitting: Family Medicine

## 2018-10-31 ENCOUNTER — Ambulatory Visit
Admission: RE | Admit: 2018-10-31 | Discharge: 2018-10-31 | Disposition: A | Discharge status: Home / Self Care | Payer: BLUE CROSS/BLUE SHIELD | Source: Ambulatory Visit | Attending: Family Medicine | Admitting: Family Medicine

## 2018-10-31 DIAGNOSIS — N6320 Unspecified lump in the left breast, unspecified quadrant: Secondary | ICD-10-CM

## 2018-11-01 NOTE — Progress Notes (Addendum)
[facility] Note  11/02/2018     CHIEF COMPLAINT Patient presents for Retina Follow Up s/p 25g PPV/MP/EL/Gas OS for TRD and VH, 01/13/18  HISTORY OF PRESENT ILLNESS: Amber Young is a 63 y.o. female who presents to the clinic today for:   HPI    Retina Follow Up    Patient presents with  Diabetic Retinopathy.  In both eyes.  Severity is severe.  Duration of 4 months.  Since onset it is gradually worsening (Vision seems worse OS, stable OD).  I, the attending physician,  performed the HPI with the patient and updated documentation appropriately.          Comments    Patient was due for f/u in December 2019, but was hospitalized for fluctuating BP's and BS's. Insurance ran out before next appointment and patient was waiting to get insurance before following up with our office. BS today was 123 this am. Last a1c patient doesn't remember. Patient ran out of all eye gtts for about 1 month. Right now, only using ketorolac bid OD and cosopt bid OS (for about 2 weeks). Not using Pred forte or polytrim ointment per instructions at last appointment with Dr. Coralyn Pear.        Last edited by Bernarda Caffey, MD on 11/02/2018 11:06 AM. (History)    pt states she caught pneumonia which then turned into something else, pt state she was hospitalized at [facility], pt states she also had BCBS which expired in December, pt state she tried to get new insurance but was unable to get it bc of pre-existing conditions, she states she finally got approved through medicaid at the beginning of the month even though she had been previously denies, pt states vision has gone down in her left eye, she thinks it went down in January to the point where she couldn't see anything, pt states she ran out of Cosopt in January  Referring physician: Azzie Glatter, [place], [place] [address]  HISTORICAL INFORMATION:   Selected notes from the MEDICAL RECORD NUMBER Referred by  Molli Barrows, FNP for DM exam;  LEE-  Ocular Hx-  PMH- DM (A1C - 9.4 (02.13.19)), CKD, HTN, CAD, CHF, hx of stroke    CURRENT MEDICATIONS: Current Outpatient Medications (Ophthalmic Drugs)  Medication Sig  . dorzolamide-timolol (COSOPT) 22.3-6.8 MG/ML ophthalmic solution Place 1 drop into the left eye 2 (two) times daily.  Marland Kitchen ketorolac (ACULAR) 0.5 % ophthalmic solution Place 1 drop into the right eye 4 (four) times daily for 30 days. (Patient taking differently: Place 1 drop into the right eye 2 (two) times daily. )  . prednisoLONE acetate (PRED FORTE) 1 % ophthalmic suspension Place 1 drop into the right eye 4 (four) times daily for 7 days.   Current Facility-Administered Medications (Ophthalmic Drugs)  Medication Route  . aflibercept (EYLEA) SOLN 2 mg Intravitreal  . aflibercept (EYLEA) SOLN 2 mg Intravitreal  . aflibercept (EYLEA) SOLN 2 mg Intravitreal  . aflibercept (EYLEA) SOLN 2 mg Intravitreal   Current Outpatient Medications (Other)  Medication Sig  . amLODipine (NORVASC) 10 MG tablet Take 1 tablet (10 mg total) by mouth daily.  Marland Kitchen atorvastatin (LIPITOR) 40 MG tablet Take 1 tablet (40 mg total) by mouth daily at 6 PM.  . carvedilol (COREG) 25 MG tablet Take 0.5 tablets (12.5 mg total) by mouth 2 (two) times daily.  . ferrous sulfate (FERROUSUL) 325 (65 FE) MG tablet Take 1 tablet (325  mg total) by mouth every other day.  . furosemide (LASIX) 40 MG tablet Take 1 tablet (40 mg total) by mouth daily.  . Glucose Blood (BLOOD GLUCOSE TEST STRIPS) STRP Use as directed  . hydrALAZINE (APRESOLINE) 50 MG tablet Take 1.5 tablets (75 mg total) by mouth 3 (three) times daily.  . hydrochlorothiazide (HYDRODIURIL) 25 MG tablet Take 1 tablet (25 mg total) by mouth daily.  . insulin glargine (LANTUS) 100 UNIT/ML injection Inject 0.2 mLs (20 Units total) into the skin 2 (two) times daily.  . Insulin Pen Needle 29G X 10MM MISC Use as directed  . INSULIN SYRINGE .5CC/29G 29G X 1/2" 0.5 ML  MISC Use to administer insulin three times daily  . nitroGLYCERIN (NITROSTAT) 0.4 MG SL tablet Place 1 tablet (0.4 mg total) under the tongue every 5 (five) minutes x 3 doses as needed for chest pain.  . potassium chloride (K-DUR,KLOR-CON) 10 MEQ tablet Take 1 tablet with each dose of furosemide, daily.  . ticagrelor (BRILINTA) 90 MG TABS tablet Take 1 tablet (90 mg total) by mouth 2 (two) times daily.  . TRUEPLUS PEN NEEDLES 31G X 6 MM MISC USE AS DIRECTED  . warfarin (COUMADIN) 5 MG tablet Take 1 to 1.5 tablets by mouth daily as directed by coumadin clinic   Current Facility-Administered Medications (Other)  Medication Route  . Bevacizumab (AVASTIN) SOLN 1.25 mg Intravitreal  . Bevacizumab (AVASTIN) SOLN 1.25 mg Intravitreal  . Bevacizumab (AVASTIN) SOLN 1.25 mg Intravitreal  . Bevacizumab (AVASTIN) SOLN 1.25 mg Intravitreal  . Bevacizumab (AVASTIN) SOLN 1.25 mg Intravitreal      REVIEW OF SYSTEMS: ROS    Positive for: Endocrine, Eyes   Negative for: Constitutional, Gastrointestinal, Neurological, Skin, Genitourinary, Musculoskeletal, HENT, Cardiovascular, Respiratory, Psychiatric, Allergic/Imm, Heme/Lymph   Last edited by Roselee Nova D on 11/02/2018  9:16 AM. (History)       ALLERGIES Allergies  Allergen Reactions  . Ace Inhibitors Cough    PAST MEDICAL HISTORY Past Medical History:  Diagnosis Date  . Anemia   . CHF (congestive heart failure) ([name])   . CKD (chronic kidney disease), stage III ([place])   . Coronary artery disease   . GERD (gastroesophageal reflux disease)   . Heart murmur   . Hyperlipidemia   . Hypertension   . Proliferative diabetic retinopathy ([place])    left eye with vitreous hemorrhage and tractional retinal detachment  . Stroke ([place]) 09/2014   numbness left upper lip, finger tips on left hand; "resolved" (05/18/2016)  . Type II diabetes mellitus ([place])   . Wears glasses    Past Surgical History:  Procedure Laterality Date  . CARDIAC CATHETERIZATION  N/A 05/18/2016   Procedure: Left Heart Cath and Coronary Angiography;  Surgeon: Jettie Booze, MD;  Location: [place] [facility];  Service: Cardiovascular;  Laterality: N/A;  . CARDIAC CATHETERIZATION N/A 05/18/2016   Procedure: Coronary Stent Intervention;  Surgeon: Jettie Booze, MD;  Location: [place] [facility];  Service: Cardiovascular;  Laterality: N/A;  . COLONOSCOPY W/ BIOPSIES AND POLYPECTOMY    . CORONARY ANGIOPLASTY    . CORONARY ANGIOPLASTY WITH STENT PLACEMENT    . CORONARY STENT INTERVENTION N/A 09/07/2017   Procedure: CORONARY STENT INTERVENTION;  Surgeon: Leonie Man, MD;  Location: [place] [facility];  Service: Cardiovascular;  Laterality: N/A;  . EYE SURGERY    . GAS INSERTION Left 01/13/2018   Procedure: INSERTION OF GAS;  Surgeon: Bernarda Caffey, MD;  Location: [facility];  Service: Ophthalmology;  Laterality: Left;  . LEFT HEART CATH AND CORONARY ANGIOGRAPHY N/A 09/07/2017   Procedure: LEFT HEART CATH AND CORONARY ANGIOGRAPHY;  Surgeon: Leonie Man, MD;  Location: [place] [facility];  Service: Cardiovascular;  Laterality: N/A;  . MEMBRANE PEEL Left 01/13/2018   Procedure: MEMBRANE PEEL LEFT EYE ;  Surgeon: Bernarda Caffey, MD;  Location: [place];  Service: Ophthalmology;  Laterality: Left;  Marland Kitchen MULTIPLE TOOTH EXTRACTIONS    . PARS PLANA VITRECTOMY Left 01/13/2018   Procedure: PARS PLANA VITRECTOMY WITH 25 GAUGE LEFT EYE WITH ENDOLASER;  Surgeon: Bernarda Caffey, MD;  Location: [place];  Service: Ophthalmology;  Laterality: Left;  . TUBAL LIGATION  1980  . VAGINAL HYSTERECTOMY  1999   "partial; fibroids"    FAMILY HISTORY Family History  Problem Relation Age of Onset  . Diabetes Mother   . Hypertension Mother   . COPD Mother   . Heart failure Mother   . Hypertension Sister   . Hypertension Brother     SOCIAL HISTORY Social History   Tobacco Use  . Smoking status: Never Smoker  . Smokeless tobacco: Never Used  Substance Use Topics  . Alcohol use: No     Alcohol/week: 0.0 standard drinks  . Drug use: No         OPHTHALMIC EXAM:  Base Eye Exam    Visual Acuity (Snellen - Linear)      Right Left   Dist cc 20/100 -2 HM   Dist ph cc 20/60 -1    Correction:  Glasses       Tonometry (Tonopen, 9:45 AM)      Right Left   Pressure 15 39       Tonometry #2 (Tonopen, 9:46 AM)      Right Left   Pressure 15 41       Tonometry #3 (Tonopen, 11:26 AM)      Right Left   Pressure 17 29       Tonometry Comments   I drop cosopt and brimonidine given OS per Dr. Coralyn Pear prior to dilation       Pupils      Dark Light Shape React APD   Right 4 3 Round Slow None   Left 5 5 Round Minimal None       Visual Fields (Counting fingers)      Left Right   Restrictions Total superior temporal, inferior temporal, superior nasal, inferior nasal deficiencies        Extraocular Movement      Right Left    Full, Ortho Full, Ortho       Neuro/Psych    Oriented x3:  Yes   Mood/Affect:  Normal       Dilation    Both eyes:  1.0% Mydriacyl, 2.5% Phenylephrine @ 9:56 AM        Slit Lamp and Fundus Exam    Slit Lamp Exam      Right Left   Lids/Lashes Dermatochalasis - upper lid, mild Meibomian gland dysfunction Dermatochalasis - upper lid, Meibomian gland dysfunction   Conjunctiva/Sclera White and quiet White and quiet   Cornea Arcus, 1+ Punctate epithelial erosions Arcus, 2+ Punctate epithelial erosions, EBMD with 4x5DD irregular epi inferiorly, pigmented [place], mild corneal haze   Anterior Chamber Deep and quiet Deep and quiet   Iris Round and dilated, No NVI Round and dilated, No NVI, persistent pupillary membrane   Lens 2-3+ Nuclear sclerosis, 2+ Cortical cataract, Vacuoles, iridescent refractile opacities 3-4+ brunescent Nuclear sclerosis, 2+  Cortical cataract, Vacuoles, iridescent refractile opacities, 1+ Posterior subcapsular cataract   Vitreous Vitreous syneresis, mild Asteroid hyalosis superiorly Post vitrectomy, +pigment in  anterior vitreous       Fundus Exam      Right Left   Disc Pink and Sharp, Compact, Sharp rim mild pallor, very thin rim, inferior PPA   C/D Ratio 0.5 0.9   Macula +central edema, scattered MA/exudates  Blunted foveal reflex, scattered Microaneurysms, +cystic changes   Vessels Vascular attenuation, Tortuous Attenuated   Periphery Attached, scattered DBH attached; good 360 PRP in place; IRH superiorly, scattered dot hemorrhages        Refraction    Wearing Rx      Sphere Cylinder Axis   Right -4.00 +1.50 178   Left -4.00 +1.25 009   Type:  SVL       Manifest Refraction   Dark reflex OS          IMAGING AND PROCEDURES  Imaging and Procedures for 12/07/17  OCT, Retina - OU - Both Eyes       Right Eye Quality was good. Central Foveal Thickness: 542. Progression has worsened. Findings include abnormal foveal contour, intraretinal fluid, vitreomacular adhesion , no SRF, outer retinal atrophy (Interval increase in IRF).   Left Eye Quality was good. Central Foveal Thickness: 305. Progression has been stable. Findings include intraretinal fluid, no SRF, abnormal foveal contour, outer retinal atrophy, intraretinal hyper-reflective material (Persistent IRF - some interval shifting of IRF location).   Notes *Images captured and stored on drive  Diagnosis / Impression:  Persistent DME OU OD with mild interval worsening OS with interval shifting of fluid  Clinical management:  See below  Abbreviations: NFP - Normal foveal profile. CME - cystoid macular edema. PED - pigment epithelial detachment. IRF - intraretinal fluid. SRF - subretinal fluid. EZ - ellipsoid zone. ERM - epiretinal membrane. ORA - outer retinal atrophy. ORT - outer retinal tubulation. SRHM - subretinal hyper-reflective material         Fluorescein Angiography Optos (Transit OD)       Right Eye   Progression has worsened. Early phase findings include vascular perfusion defect, microaneurysm, retinal  neovascularization. Mid/Late phase findings include microaneurysm, retinal neovascularization, vascular perfusion defect, leakage.   Left Eye   Progression has improved. Early phase findings include staining. Mid/Late phase findings include staining, leakage.   Notes Images stored on drive;   Impression: PDR OU OD -- Leaking MA 360; patches of capillary nonperfusion; patches of NV nasal quadrant OS -- stable with 360 staining of PRP scars        Intravitreal Injection, Pharmacologic Agent - OD - Right Eye       Time Out 11/02/2018. 12:08 PM. Confirmed correct patient, procedure, site, and patient consented.   Anesthesia Topical anesthesia was used. Anesthetic medications included Lidocaine 2%, Proparacaine 0.5%.   Procedure Preparation included 5% betadine to ocular surface, eyelid speculum. A 30 gauge needle was used.   Injection:  2 mg aflibercept Alfonse Flavors) SOLN   NDC: A3590391, Lot: [id number], Expiration date: 04/09/2019   Route: Intravitreal, Site: Right Eye, Waste: 0.05 mL  Post-op Post injection exam found visual acuity of at least counting fingers. The patient tolerated the procedure well. There were no complications. The patient received written and verbal post procedure care education.        Panretinal Photocoagulation - OD - Right Eye       LASER PROCEDURE NOTE  Diagnosis:   Proliferative Diabetic Retinopathy, RIGHT EYE  Procedure:  Pan-retinal photocoagulation using slit lamp laser, RIGHT EYE  Anesthesia:  Topical  Surgeon: Bernarda Caffey, MD, PhD   Informed consent obtained, operative eye marked, and time out performed prior to initiation of laser.   Lumenis Smart532 slit lamp laser Pattern: 2x2 square, 3x3 square Power: 360 mW Duration: 50 msec  Spot size: 200 microns  # spots: 638 spots -- all nasal quadrant  Complications: None.  Notes: Laser significantly limited by pt's difficulty maintaining eye position and sensitivity to  light/laser. Cataract obscuring view and preventing laser up take peripherally and in scattered focal areas  RTC: 4 wks  Patient tolerated the procedure well and received written and verbal post-procedure care information/education.                  ASSESSMENT/PLAN:    ICD-10-CM   1. Proliferative diabetic retinopathy of both eyes with macular edema associated with type 2 diabetes mellitus (HCC) R67.8938 Fluorescein Angiography Optos (Transit OD)    Intravitreal Injection, Pharmacologic Agent - OD - Right Eye    aflibercept (EYLEA) SOLN 2 mg    Panretinal Photocoagulation - OD - Right Eye  2. Retinal edema H35.81 OCT, Retina - OU - Both Eyes  3. Vitreous hemorrhage of left eye (HCC) H43.12   4. 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 Essential hypertension I10   6. Hypertensive retinopathy of both eyes H35.033 Fluorescein Angiography Optos (Transit OD)  7. Combined forms of age-related cataract of both eyes H25.813   8. Primary open angle glaucoma (POAG) of left eye, severe stage H40.1123   9. 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 Proliferative diabetic retinopathy with diabetic macular edema, OU - initial FA 3.13.19 shows NVE OU - OCT w/ diabetic macular edema, OU - A1c 11.7% on 03.18.20 from 9.3% (12.11.19) - s/p IVA #1 OS (03.13.19), #2 (04.15.19), #3 (05.13.19), #4 (06.03.19), #5 (08.22.19), #6 (09.19.19), #7 (10.17.19) - s/p IVA #1 OD (03.15.19), #2 (04.15.19), #3 (05.13.19), #4 (07.08.19)  - S/P IVE #1 OD (08.22.19) -- sample, #2 (09.19.19), #3 (10.17.19)  - s/p PRP OS (04.05.19), PRP fill-in OS (04.30.19) - s/p 25g PPV/MP/EL/Gas OS under general anesthesia for VH and TRD repair OS (06.06.19) - lost to follow up since 11.14.19 due to health and insurance reasons - today, BCVA 20/60-1 OD, HM OS (OS down from 20/40 on 11.14.19) - OCT today shows interval increase in DME OU - repeat FA  (3.25.2020) shows persistent NV OD (nasal - recommend IVE OD #4 today (03.25.20) and PRP OD today - approved for Eylea for 2020 - RBA of procedures discussed, questions answered - informed consents obtained and signed - see procedure notes - start PF QID OD x7 days - F/U 4 wks, DFE, OCT  3. History of Vitreous hemorrhage OS -- resolved - secondary to #1, above - s/p IVA OS as above - s/p PRP OS (04.05.19 and 04.30.19) - of note pt on warfarin  - s/p PPV w/ laser as above  4. 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 Hypertensive retinopathy OU - discussed importance of tight BP control - montior  7. 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 - discussed diagnosis and progression - significant cataract progression OS following PPV - will refer to [facility]  Care for cat eval and management  8,9. Ocular hypertension / POAG OS - severe stage -- at last visit, c/d was 0.5 and BCVA was 20/40; today, c/d 0.9 w/ disc pallor, and BCVA HM - suspect majority of decreased BCVA OS is due to glaucoma, but significant contributions probably from cataract and EBMD/keratopathy OS - IOP on work up today 40 mmHg, dropped to 29 with 1 round of brimonidine and cosopt - will refer to [facility] for ocular hypertension and glaucoma management -- spoke with Dr. Zenia Resides about case - brimonidine and cosopt BID OS until appt with Dr. Katy Fitch -- bottles given to patient    Ophthalmic Meds Ordered this visit:  Meds ordered this encounter  Medications  . prednisoLONE acetate (PRED FORTE) 1 % ophthalmic suspension    Sig: Place 1 drop into the right eye 4 (four) times daily for 7 days.    Dispense:  10 mL    Refill:  0  . aflibercept (EYLEA) SOLN 2 mg       Return in about 4 weeks (around 11/30/2018) for Dilated Exam, OCT, Possible  Injxn.  There are no Patient Instructions on file for this visit.   Explained the diagnoses, plan, and follow up with the patient and they expressed understanding.  Patient expressed understanding of the importance of proper follow up care.   This document serves as a record of services personally performed by Gardiner Sleeper, MD, PhD. It was created on their behalf by Ernest Mallick, OA, an ophthalmic assistant. The creation of this record is the provider's dictation and/or activities during the visit.    Electronically signed by: Ernest Mallick, OA  03.24.2020 2:09 PM    Gardiner Sleeper, M.D., Ph.D. Diseases & Surgery of the Retina and Vitreous [facility]  I have reviewed the above documentation for accuracy and completeness, and I agree with the above. Gardiner Sleeper, M.D., Ph.D. 11/02/18 2:09 PM    Abbreviations: M myopia (nearsighted); A astigmatism; H hyperopia (farsighted); P presbyopia; Mrx spectacle prescription;  CTL contact lenses; OD right eye; OS left eye; OU both eyes  XT exotropia; ET esotropia; PEK punctate epithelial keratitis; PEE punctate epithelial erosions; DES dry eye syndrome; MGD meibomian gland dysfunction; ATs artificial tears; PFAT's preservative free artificial tears; Branch nuclear sclerotic cataract; PSC posterior subcapsular cataract; ERM epi-retinal membrane; PVD posterior vitreous detachment; RD retinal detachment; DM diabetes mellitus; DR diabetic retinopathy; NPDR non-proliferative diabetic retinopathy; PDR proliferative diabetic retinopathy; CSME clinically significant macular edema; DME diabetic macular edema; dbh dot blot hemorrhages; CWS cotton wool spot; POAG primary open angle glaucoma; C/D cup-to-disc ratio; HVF humphrey visual field; GVF goldmann visual field; OCT optical coherence tomography; IOP intraocular pressure; BRVO Branch retinal vein occlusion; CRVO central retinal vein occlusion; CRAO central retinal artery occlusion; BRAO  branch retinal artery occlusion; RT retinal tear; SB scleral buckle; PPV pars plana vitrectomy; VH Vitreous hemorrhage; PRP panretinal laser photocoagulation; IVK intravitreal kenalog; VMT vitreomacular traction; MH Macular hole;  NVD neovascularization of the disc; NVE neovascularization elsewhere; AREDS age related eye disease study; ARMD age related macular degeneration; POAG primary open angle glaucoma; EBMD epithelial/anterior basement membrane dystrophy; ACIOL anterior chamber intraocular lens; IOL intraocular lens; PCIOL posterior chamber intraocular lens; Phaco/IOL phacoemulsification with intraocular lens placement; Diagonal photorefractive keratectomy; LASIK laser assisted in situ keratomileusis; HTN hypertension; DM diabetes mellitus; COPD chronic obstructive pulmonary disease

## 2018-11-02 ENCOUNTER — Encounter (INDEPENDENT_AMBULATORY_CARE_PROVIDER_SITE_OTHER): Payer: Self-pay | Admitting: Ophthalmology

## 2018-11-02 ENCOUNTER — Ambulatory Visit (INDEPENDENT_AMBULATORY_CARE_PROVIDER_SITE_OTHER): Payer: Medicaid Other | Admitting: Ophthalmology

## 2018-11-02 ENCOUNTER — Other Ambulatory Visit: Payer: Self-pay

## 2018-11-02 DIAGNOSIS — H35033 Hypertensive retinopathy, bilateral: Secondary | ICD-10-CM

## 2018-11-02 DIAGNOSIS — E113513 Type 2 diabetes mellitus with proliferative diabetic retinopathy with macular edema, bilateral: Secondary | ICD-10-CM

## 2018-11-02 DIAGNOSIS — H25813 Combined forms of age-related cataract, bilateral: Secondary | ICD-10-CM

## 2018-11-02 DIAGNOSIS — H3581 Retinal edema: Secondary | ICD-10-CM

## 2018-11-02 DIAGNOSIS — H401123 Primary open-angle glaucoma, left eye, severe stage: Secondary | ICD-10-CM

## 2018-11-02 DIAGNOSIS — H3342 Traction detachment of retina, left eye: Secondary | ICD-10-CM

## 2018-11-02 DIAGNOSIS — H40052 Ocular hypertension, left eye: Secondary | ICD-10-CM

## 2018-11-02 DIAGNOSIS — H1852 Epithelial (juvenile) corneal dystrophy: Secondary | ICD-10-CM

## 2018-11-02 DIAGNOSIS — I10 Essential (primary) hypertension: Secondary | ICD-10-CM

## 2018-11-02 DIAGNOSIS — H4312 Vitreous hemorrhage, left eye: Secondary | ICD-10-CM

## 2018-11-02 DIAGNOSIS — H18529 Epithelial (juvenile) corneal dystrophy, unspecified eye: Secondary | ICD-10-CM

## 2018-11-02 MED ORDER — PREDNISOLONE ACETATE 1 % OP SUSP: 1.0000 [drp] | Freq: Four times a day (QID) | OPHTHALMIC | 0 refills | Status: AC

## 2018-11-02 MED ORDER — AFLIBERCEPT 2MG/0.05ML IZ SOLN FOR KALEIDOSCOPE
2.0000 mg | INTRAVITREAL | Status: AC | PRN
  Administered 2018-11-02: 2 mg via INTRAVITREAL

## 2018-11-02 MED FILL — PREDNISOLONE AC 1% EYE DROP: 1 | 7 days supply | Qty: 10 | Fill #0

## 2018-11-28 ENCOUNTER — Encounter: Payer: Self-pay | Admitting: Family Medicine

## 2018-11-28 ENCOUNTER — Other Ambulatory Visit: Payer: Self-pay

## 2018-11-28 ENCOUNTER — Ambulatory Visit (INDEPENDENT_AMBULATORY_CARE_PROVIDER_SITE_OTHER): Payer: Medicaid Other | Admitting: Family Medicine

## 2018-11-28 VITALS — BP 180/110 | HR 88 | Temp 98.2°F | Ht 66.0 in | Wt 186.4 lb

## 2018-11-28 DIAGNOSIS — N6002 Solitary cyst of left breast: Secondary | ICD-10-CM

## 2018-11-28 DIAGNOSIS — R600 Localized edema: Secondary | ICD-10-CM

## 2018-11-28 DIAGNOSIS — N6320 Unspecified lump in the left breast, unspecified quadrant: Secondary | ICD-10-CM

## 2018-11-28 DIAGNOSIS — Z794 Long term (current) use of insulin: Secondary | ICD-10-CM | POA: Diagnosis not present

## 2018-11-28 DIAGNOSIS — I10 Essential (primary) hypertension: Secondary | ICD-10-CM

## 2018-11-28 DIAGNOSIS — H547 Unspecified visual loss: Secondary | ICD-10-CM

## 2018-11-28 DIAGNOSIS — E119 Type 2 diabetes mellitus without complications: Secondary | ICD-10-CM

## 2018-11-28 DIAGNOSIS — R8290 Unspecified abnormal findings in urine: Secondary | ICD-10-CM

## 2018-11-28 DIAGNOSIS — R634 Abnormal weight loss: Secondary | ICD-10-CM

## 2018-11-28 DIAGNOSIS — R609 Edema, unspecified: Secondary | ICD-10-CM | POA: Diagnosis not present

## 2018-11-28 DIAGNOSIS — Z09 Encounter for follow-up examination after completed treatment for conditions other than malignant neoplasm: Secondary | ICD-10-CM

## 2018-11-28 LAB — POCT URINALYSIS DIP (MANUAL ENTRY)
Bilirubin, UA: NEGATIVE
Glucose, UA: 500 mg/dL — AB
Ketones, POC UA: NEGATIVE mg/dL
Leukocytes, UA: NEGATIVE
Nitrite, UA: NEGATIVE
Protein Ur, POC: >=300 mg/dL — AB
Spec Grav, UA: 1.025 (ref 1.010–1.025)
Urobilinogen, UA: 0.2 E.U./dL
pH, UA: 6 (ref 5.0–8.0)

## 2018-11-28 LAB — POCT GLYCOSYLATED HEMOGLOBIN (HGB A1C): Hemoglobin A1C: 11.2 % — AB (ref 4.0–5.6)

## 2018-11-28 LAB — GLUCOSE, POCT (MANUAL RESULT ENTRY): POC Glucose: 205 mg/dl — AB (ref 70–99)

## 2018-11-28 MED ORDER — METFORMIN HCL 500 MG PO TABS: 500.0000 mg | ORAL_TABLET | Freq: Two times a day (BID) | ORAL | 3 refills | Status: DC

## 2018-11-28 MED ORDER — CLONIDINE HCL 0.1 MG PO TABS
0.1000 mg | ORAL_TABLET | Freq: Once | ORAL | Status: AC
  Administered 2018-11-28: 0.1 mg via ORAL

## 2018-11-28 MED ORDER — GLIPIZIDE 10 MG PO TABS: 10.0000 mg | ORAL_TABLET | Freq: Two times a day (BID) | ORAL | 3 refills | Status: DC

## 2018-11-28 MED FILL — LANTUS 100 UNITS/ML VIAL: 100 | 25 days supply | Qty: 10 | Fill #1

## 2018-11-28 MED FILL — metFORMIN HCL 500 MG TABS: 500 | 30 days supply | Qty: 60 | Fill #0

## 2018-11-28 MED FILL — glipiZIDE 10 MG TABS: 10 | 30 days supply | Qty: 60 | Fill #0

## 2018-11-28 NOTE — Progress Notes (Signed)
Patient [facility]   Established Patient Office Visit  Subjective:  Patient ID: Amber Young, female    DOB: 03/14/1956  Age: 63 y.o. MRN: [id number]  CC:  Chief Complaint  Patient presents with  . Follow-up    Chronic condition     HPI Amber Young is a 63 year old female who presents for follow up today.    Past Medical History:  Diagnosis Date  . Anemia   . CHF (congestive heart failure) ([place])   . CKD (chronic kidney disease), stage III ([place])   . Coronary artery disease   . GERD (gastroesophageal reflux disease)   . Heart murmur   . Hyperlipidemia   . Hypertension   . 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 Stroke ([place]) 09/2014   numbness left upper lip, finger tips on left hand; "resolved" (05/18/2016)  . Type II diabetes mellitus ([place])   . Wears glasses     Current Status: Since her last office visit, she is doing well with no complaints. She states that she does monitor her blood pressures at home. She continues to have visual changes. States that her husband has recently placed grab bars in shower to prevent falls. She denies visual changes, chest pain, cough, shortness of breath, heart palpitations, and falls. She has occasional headaches and dizziness with position changes. Denies severe headaches, confusion, seizures, nausea and vomiting. She continues to follow up with Cardiologist. She had a recent Mammogram and was diagnosed with a left breast boil, which she drain at home.  Her most recent normal range of preprandial blood glucose levels have been between 139-170. She denies fatigue, frequent urination, blurred vision, excessive hunger, excessive thirst, weight gain, weight loss, and poor wound healing. She has had a 10 lb weight loss in 4 months.   She denies fevers, chills, fatigue, recent infections, weight loss, and night sweats. She has  not had any falls. No reports of GI problems such as diarrhea, and constipation. She has no reports of blood in stools, dysuria and hematuria. No depression or anxiety reported. She denies pain today.   Past Surgical History:  Procedure Laterality Date  . CARDIAC CATHETERIZATION N/A 05/18/2016   Procedure: Left Heart Cath and Coronary Angiography;  Surgeon: Jettie Booze, MD;  Location: [facility];  Service: Cardiovascular;  Laterality: N/A;  . CARDIAC CATHETERIZATION N/A 05/18/2016   Procedure: Coronary Stent Intervention;  Surgeon: Jettie Booze, MD;  Location: [facility];  Service: Cardiovascular;  Laterality: N/A;  . COLONOSCOPY W/ BIOPSIES AND POLYPECTOMY    . CORONARY ANGIOPLASTY    . CORONARY ANGIOPLASTY WITH STENT PLACEMENT    . CORONARY STENT INTERVENTION N/A 09/07/2017   Procedure: CORONARY STENT INTERVENTION;  Surgeon: Leonie Man, MD;  Location: [facility];  Service: Cardiovascular;  Laterality: N/A;  . EYE SURGERY    . GAS INSERTION Left 01/13/2018   Procedure: INSERTION OF GAS;  Surgeon: Bernarda Caffey, MD;  Location: [facility];  Service: Ophthalmology;  Laterality: Left;  . LEFT HEART CATH AND CORONARY ANGIOGRAPHY N/A 09/07/2017   Procedure: LEFT HEART CATH AND CORONARY ANGIOGRAPHY;  Surgeon: Leonie Man, MD;  Location: [facility];  Service: Cardiovascular;  Laterality: N/A;  . MEMBRANE PEEL Left 01/13/2018   Procedure: MEMBRANE PEEL LEFT EYE ;  Surgeon: Bernarda Caffey, MD;  Location: [facility];  Service: Ophthalmology;  Laterality: Left;  Marland Kitchen MULTIPLE TOOTH EXTRACTIONS    . PARS PLANA VITRECTOMY Left 01/13/2018   Procedure: PARS PLANA VITRECTOMY WITH 25 GAUGE LEFT EYE WITH ENDOLASER;  Surgeon: Bernarda Caffey, MD;  Location: [facility];  Service: Ophthalmology;  Laterality: Left;  . TUBAL LIGATION  1980  . VAGINAL HYSTERECTOMY  1999   "partial; fibroids"    Family History  Problem Relation Age of Onset  . Diabetes Mother   . Hypertension Mother    . COPD Mother   . Heart failure Mother   . Hypertension Sister   . Hypertension Brother     Social History   Socioeconomic History  . Marital status: Married    Spouse name: Not on file  . Number of children: Not on file  . Years of education: 64  . Highest education level: Not on file  Occupational History  . Occupation: hairdresser  Social Needs  . Financial resource strain: Not on file  . Food insecurity:    Worry: Not on file    Inability: Not on file  . Transportation needs:    Medical: Not on file    Non-medical: Not on file  Tobacco Use  . Smoking status: Never Smoker  . Smokeless tobacco: Never Used  Substance and Sexual Activity  . Alcohol use: No    Alcohol/week: 0.0 standard drinks  . Drug use: No  . Sexual activity: Yes  Lifestyle  . Physical activity:    Days per week: Not on file    Minutes per session: Not on file  . Stress: Not on file  Relationships  . Social connections:    Talks on phone: Not on file    Gets together: Not on file    Attends religious service: Not on file    Active member of club or organization: Not on file    Attends meetings of clubs or organizations: Not on file    Relationship status: Not on file  . Intimate partner violence:    Fear of current or ex partner: Not on file    Emotionally abused: Not on file    Physically abused: Not on file    Forced sexual activity: Not on file  Other Topics Concern  . Not on file  Social History Narrative   Married, 2 children, hairdresser   Caffeine use- occasional tea or coffee   Right handed    Outpatient Medications Prior to Visit  Medication Sig Dispense Refill  . amLODipine (NORVASC) 10 MG tablet Take 1 tablet (10 mg total) by mouth daily. 90 tablet 2  . atorvastatin (LIPITOR) 40 MG tablet Take 1 tablet (40 mg total) by mouth daily at 6 PM. 90 tablet 1  . carvedilol (COREG) 25 MG tablet Take 0.5 tablets (12.5 mg total) by mouth 2 (two) times daily. 30 tablet 0  .  dorzolamide-timolol (COSOPT) 22.3-6.8 MG/ML ophthalmic solution Place 1 drop into the left eye 2 (two) times daily.    . ferrous sulfate (FERROUSUL) 325 (65 FE) MG tablet Take 1 tablet (325 mg total) by mouth every other day. 90 tablet 3  . furosemide (LASIX) 40 MG tablet Take 1 tablet (40 mg total) by mouth daily. 90 tablet 1  . Glucose Blood (BLOOD GLUCOSE TEST STRIPS) STRP Use as directed 200 each 0  . hydrALAZINE (APRESOLINE) 50 MG tablet Take 1.5 tablets (75 mg total) by mouth 3 (three) times daily. 135 tablet 6  . hydrochlorothiazide (HYDRODIURIL) 25 MG tablet Take 1 tablet (25 mg  total) by mouth daily. 30 tablet 2  . insulin glargine (LANTUS) 100 UNIT/ML injection Inject 0.2 mLs (20 Units total) into the skin 2 (two) times daily. 20 mL 11  . Insulin Pen Needle 29G X 10MM MISC Use as directed 100 each 3  . INSULIN SYRINGE .5CC/29G 29G X 1/2" 0.5 ML MISC Use to administer insulin three times daily 200 each 5  . nitroGLYCERIN (NITROSTAT) 0.4 MG SL tablet Place 1 tablet (0.4 mg total) under the tongue every 5 (five) minutes x 3 doses as needed for chest pain. 30 tablet 12  . potassium chloride (K-DUR,KLOR-CON) 10 MEQ tablet Take 1 tablet with each dose of furosemide, daily. 30 tablet 3  . ticagrelor (BRILINTA) 90 MG TABS tablet Take 1 tablet (90 mg total) by mouth 2 (two) times daily. 180 tablet 3  . TRUEPLUS PEN NEEDLES 31G X 6 MM MISC USE AS DIRECTED 100 each 0  . warfarin (COUMADIN) 5 MG tablet Take 1 to 1.5 tablets by mouth daily as directed by coumadin clinic 120 tablet 1   Facility-Administered Medications Prior to Visit  Medication Dose Route Frequency Provider Last Rate Last Dose  . aflibercept (EYLEA) SOLN 2 mg  2 mg Intravitreal  Bernarda Caffey, MD   2 mg at 03/31/18 1056  . aflibercept (EYLEA) SOLN 2 mg  2 mg Intravitreal  Bernarda Caffey, MD   2 mg at 04/28/18 1419  . aflibercept (EYLEA) SOLN 2 mg  2 mg Intravitreal  Bernarda Caffey, MD   2 mg at 05/27/18 0028  . aflibercept (EYLEA)  SOLN 2 mg  2 mg Intravitreal  Bernarda Caffey, MD   2 mg at 06/27/18 0051  . Bevacizumab (AVASTIN) SOLN 1.25 mg  1.25 mg Intravitreal  Bernarda Caffey, MD   1.25 mg at 02/14/18 1150  . Bevacizumab (AVASTIN) SOLN 1.25 mg  1.25 mg Intravitreal  Bernarda Caffey, MD   1.25 mg at 03/31/18 1057  . Bevacizumab (AVASTIN) SOLN 1.25 mg  1.25 mg Intravitreal  Bernarda Caffey, MD   1.25 mg at 04/28/18 1419  . Bevacizumab (AVASTIN) SOLN 1.25 mg  1.25 mg Intravitreal  Bernarda Caffey, MD   1.25 mg at 05/27/18 0028  . Bevacizumab (AVASTIN) SOLN 1.25 mg  1.25 mg Intravitreal  Bernarda Caffey, MD   1.25 mg at 06/27/18 0051    Allergies  Allergen Reactions  . Ace Inhibitors Cough    ROS Review of Systems  Constitutional: Negative.   HENT: Negative.   Eyes: Positive for visual disturbance.  Respiratory: Negative.   Cardiovascular: Negative.   Gastrointestinal: Negative.   Endocrine: Negative.   Genitourinary: Negative.   Musculoskeletal: Negative.   Skin: Negative.   Allergic/Immunologic: Negative.   Neurological: Positive for dizziness and headaches.  Hematological: Negative.   Psychiatric/Behavioral: Negative.       Objective:    Physical Exam  Constitutional: She is oriented to person, place, and time. She appears well-developed and well-nourished.  HENT:  Head: Normocephalic and atraumatic.  Eyes: Conjunctivae are normal.  Neck: Normal range of motion. Neck supple.  Cardiovascular: Normal rate, regular rhythm, normal heart sounds and intact distal pulses.  Pulmonary/Chest: Effort normal and breath sounds normal.  Abdominal: Soft. Bowel sounds are normal.  Musculoskeletal: Normal range of motion.  Neurological: She is alert and oriented to person, place, and time. She has normal reflexes.  Skin: Skin is warm and dry.  Psychiatric: She has a normal mood and affect. Her behavior is normal. Judgment and thought content normal.  BP (!) 184/86   Pulse 88   Temp 98.2 F (36.8 C) (Oral)   Ht 5'  6" (1.676 m)   Wt 186 lb 6.4 oz (84.6 kg)   SpO2 96%   BMI 30.09 kg/m  Wt Readings from Last 3 Encounters:  11/28/18 186 lb 6.4 oz (84.6 kg)  10/26/18 187 lb (84.8 kg)  07/26/18 195 lb (88.5 kg)     Health Maintenance Due  Topic Date Due  . Hepatitis C Screening  1955-11-02  . PNEUMOCOCCAL POLYSACCHARIDE VACCINE AGE 47-64 HIGH RISK  04/09/1958  . OPHTHALMOLOGY EXAM  04/09/1966  . PAP SMEAR-Modifier  04/09/1977    There are no preventive care reminders to display for this patient.  Lab Results  Component Value Date   TSH 1.730 01/24/2018   Lab Results  Component Value Date   WBC 3.9 (L) 07/22/2018   HGB 8.9 (L) 07/22/2018   HCT 29.3 (L) 07/22/2018   MCV 73.1 (L) 07/22/2018   PLT 260 07/22/2018   Lab Results  Component Value Date   NA 137 07/23/2018   K 3.4 (L) 07/23/2018   CO2 18 (L) 07/23/2018   GLUCOSE 226 (H) 07/23/2018   BUN 30 (H) 07/23/2018   CREATININE 2.58 (H) 07/23/2018   BILITOT 0.6 07/21/2018   ALKPHOS 79 07/21/2018   AST 40 07/21/2018   ALT 21 07/21/2018   PROT 5.1 (L) 07/21/2018   ALBUMIN 1.8 (L) 07/21/2018   CALCIUM 7.7 (L) 07/23/2018   ANIONGAP 10 07/23/2018   Lab Results  Component Value Date   CHOL 190 01/05/2017   Lab Results  Component Value Date   HDL 80 01/05/2017   Lab Results  Component Value Date   LDLCALC 95 01/05/2017   Lab Results  Component Value Date   TRIG 74 01/05/2017   Lab Results  Component Value Date   CHOLHDL 2.4 01/05/2017   Lab Results  Component Value Date   HGBA1C 11.2 (A) 11/28/2018    Assessment & Plan:   1. Essential hypertension She has not taken blood pressure medications this morning. Blood pressures are elevated today. Clonidine 0.1 mg given to patient in office and blood pressures remain elevated. severe headaches, confusion, seizures, double vision, and blurred vision, nausea and vomiting. She will report to ED if he experiences these symptoms. Patient verbalized understanding.   - POCT  urinalysis dipstick - cloNIDine (CATAPRES) tablet 0.1 mg  2. Type 2 diabetes mellitus without complication, with long-term current use of insulin (HCC) Hgb A1c increased. She will continue to decrease foods/beverages high in sugars and carbs and follow Heart Healthy or DASH diet. Increase physical activity to at least 30 minutes cardio exercise daily.  - POCT glycosylated hemoglobin (Hb A1C) - POCT glucose (manual entry) - glipiZIDE (GLUCOTROL) 10 MG tablet; Take 1 tablet (10 mg total) by mouth 2 (two) times daily before a meal.  Dispense: 60 tablet; Refill: 3 - metFORMIN (GLUCOPHAGE) 500 MG tablet; Take 1 tablet (500 mg total) by mouth 2 (two) times daily with a meal.  Dispense: 60 tablet; Refill: 3  3. Left breast mass Mammogram reveals left breast boil. She will continue to drain as needed. Monitor.   4. Edema, peripheral 1+ bilateral lower extremity edema. Stable.   5. Visual problems  6. Recent weight loss She has loss 9 lbs in 4 months. We will continue to monitor.   7. Abnormal urinalysis Results are pending. - Urine Culture  8. Follow up She will follow up in 3 months.  Meds ordered this encounter  Medications  . cloNIDine (CATAPRES) tablet 0.1 mg  . glipiZIDE (GLUCOTROL) 10 MG tablet    Sig: Take 1 tablet (10 mg total) by mouth 2 (two) times daily before a meal.    Dispense:  60 tablet    Refill:  3  . metFORMIN (GLUCOPHAGE) 500 MG tablet    Sig: Take 1 tablet (500 mg total) by mouth 2 (two) times daily with a meal.    Dispense:  60 tablet    Refill:  3    Orders Placed This Encounter  Procedures  . Urine Culture  . POCT urinalysis dipstick  . POCT glycosylated hemoglobin (Hb A1C)  . POCT glucose (manual entry)    Referral Orders  No referral(s) requested today    Kathe Becton,  MSN, FNP-C [address] [place], [place] [address] [phone]   Problem List Items Addressed This Visit       Cardiovascular and Mediastinum   Essential hypertension - Primary   Relevant Medications   cloNIDine (CATAPRES) tablet 0.1 mg (Completed)   Other Relevant Orders   POCT urinalysis dipstick (Completed)    Other Visit Diagnoses    Type 2 diabetes mellitus without complication, with long-term current use of insulin (HCC)       Relevant Medications   glipiZIDE (GLUCOTROL) 10 MG tablet   metFORMIN (GLUCOPHAGE) 500 MG tablet   Other Relevant Orders   POCT glycosylated hemoglobin (Hb A1C) (Completed)   POCT glucose (manual entry) (Completed)   Left breast mass       Edema, peripheral       Visual problems       Recent weight loss       Abnormal urinalysis       Relevant Orders   Urine Culture (Completed)   Follow up          Meds ordered this encounter  Medications  . cloNIDine (CATAPRES) tablet 0.1 mg  . glipiZIDE (GLUCOTROL) 10 MG tablet    Sig: Take 1 tablet (10 mg total) by mouth 2 (two) times daily before a meal.    Dispense:  60 tablet    Refill:  3  . metFORMIN (GLUCOPHAGE) 500 MG tablet    Sig: Take 1 tablet (500 mg total) by mouth 2 (two) times daily with a meal.    Dispense:  60 tablet    Refill:  3    Follow-up: Return in about 3 months (around 02/27/2019).    Azzie Glatter, FNP

## 2018-11-28 NOTE — Progress Notes (Signed)
[facility] Note  11/30/2018     CHIEF COMPLAINT Patient presents for Retina Follow Up s/p 25g PPV/MP/EL/Gas OS for TRD and VH, 01/13/18  HISTORY OF PRESENT ILLNESS: Amber Young is a 63 y.o. female who presents to the clinic today for:   HPI    Retina Follow Up    Patient presents with  Diabetic Retinopathy.  In both eyes.  Severity is severe.  Duration of 4 weeks.  Since onset it is stable.  I, the attending physician,  performed the HPI with the patient and updated documentation appropriately.          Comments    4 week retina follow up, PDR ou Dme OU, Patient states no vision changes. Blood sugar 123, A1C 11       Last edited by Bernarda Caffey, MD on 11/30/2018  8:48 AM. (History)    pt states she is using Cosopt and brimonidine, but states she did not use it this morning, pt states she saw Dr. Katy Fitch and he wants to do cataract and glaucoma sx, but is unable with everything be closed down bc of COVID, pt states she is using a gray top drop in her right eye also,   Referring physician: Azzie Glatter, [place], [place] [id number]  HISTORICAL INFORMATION:   Selected notes from the MEDICAL RECORD NUMBER Referred by Molli Barrows, FNP for DM exam;  LEE-  Ocular Hx-  PMH- DM (A1C - 9.4 (02.13.19)), CKD, HTN, CAD, CHF, hx of stroke    CURRENT MEDICATIONS: Current Outpatient Medications (Ophthalmic Drugs)  Medication Sig  . dorzolamide-timolol (COSOPT) 22.3-6.8 MG/ML ophthalmic solution Place 1 drop into the left eye 2 (two) times daily.  . brimonidine (ALPHAGAN) 0.2 % ophthalmic solution Place 1 drop into the left eye 2 (two) times a day.   Current Facility-Administered Medications (Ophthalmic Drugs)  Medication Route  . aflibercept (EYLEA) SOLN 2 mg Intravitreal  . aflibercept (EYLEA) SOLN 2 mg Intravitreal  . aflibercept (EYLEA) SOLN 2 mg Intravitreal  . aflibercept (EYLEA) SOLN 2 mg Intravitreal   Current  Outpatient Medications (Other)  Medication Sig  . amLODipine (NORVASC) 10 MG tablet Take 1 tablet (10 mg total) by mouth daily.  Marland Kitchen atorvastatin (LIPITOR) 40 MG tablet Take 1 tablet (40 mg total) by mouth daily at 6 PM.  . carvedilol (COREG) 25 MG tablet Take 0.5 tablets (12.5 mg total) by mouth 2 (two) times daily.  . ferrous sulfate (FERROUSUL) 325 (65 FE) MG tablet Take 1 tablet (325 mg total) by mouth every other day.  . furosemide (LASIX) 40 MG tablet Take 1 tablet (40 mg total) by mouth daily.  Marland Kitchen glipiZIDE (GLUCOTROL) 10 MG tablet Take 1 tablet (10 mg total) by mouth 2 (two) times daily before a meal.  . Glucose Blood (BLOOD GLUCOSE TEST STRIPS) STRP Use as directed  . hydrALAZINE (APRESOLINE) 50 MG tablet Take 1.5 tablets (75 mg total) by mouth 3 (three) times daily.  . hydrochlorothiazide (HYDRODIURIL) 25 MG tablet Take 1 tablet (25 mg total) by mouth daily.  . insulin glargine (LANTUS) 100 UNIT/ML injection Inject 0.2 mLs (20 Units total) into the skin 2 (two) times daily.  . Insulin Pen Needle 29G X 10MM MISC Use as directed  . INSULIN SYRINGE .5CC/29G 29G X 1/2" 0.5 ML MISC Use to administer insulin three times daily  . metFORMIN (GLUCOPHAGE) 500 MG tablet Take 1 tablet (500 mg total) by mouth 2 (two)  times daily with a meal.  . nitroGLYCERIN (NITROSTAT) 0.4 MG SL tablet Place 1 tablet (0.4 mg total) under the tongue every 5 (five) minutes x 3 doses as needed for chest pain.  . potassium chloride (K-DUR,KLOR-CON) 10 MEQ tablet Take 1 tablet with each dose of furosemide, daily.  . ticagrelor (BRILINTA) 90 MG TABS tablet Take 1 tablet (90 mg total) by mouth 2 (two) times daily.  . TRUEPLUS PEN NEEDLES 31G X 6 MM MISC USE AS DIRECTED  . warfarin (COUMADIN) 5 MG tablet Take 1 to 1.5 tablets by mouth daily as directed by coumadin clinic   Current Facility-Administered Medications (Other)  Medication Route  . Bevacizumab (AVASTIN) SOLN 1.25 mg Intravitreal  . Bevacizumab (AVASTIN) SOLN  1.25 mg Intravitreal  . Bevacizumab (AVASTIN) SOLN 1.25 mg Intravitreal  . Bevacizumab (AVASTIN) SOLN 1.25 mg Intravitreal  . Bevacizumab (AVASTIN) SOLN 1.25 mg Intravitreal      REVIEW OF SYSTEMS: ROS    Positive for: Endocrine, Eyes   Negative for: Constitutional, Gastrointestinal, Neurological, Skin, Genitourinary, Musculoskeletal, HENT, Cardiovascular, Respiratory, Psychiatric, Allergic/Imm, Heme/Lymph   Last edited by Elmore Guise on 11/30/2018  8:20 AM. (History)       ALLERGIES Allergies  Allergen Reactions  . Ace Inhibitors Cough    PAST MEDICAL HISTORY Past Medical History:  Diagnosis Date  . Anemia   . CHF (congestive heart failure) ([place])   . CKD (chronic kidney disease), stage III ([place])   . Coronary artery disease   . GERD (gastroesophageal reflux disease)   . Heart murmur   . Hyperlipidemia   . Hypertension   . Proliferative diabetic retinopathy ([place])    left eye with vitreous hemorrhage and tractional retinal detachment  . Stroke ([place]) 09/2014   numbness left upper lip, finger tips on left hand; "resolved" (05/18/2016)  . Type II diabetes mellitus ([place])   . Wears glasses    Past Surgical History:  Procedure Laterality Date  . CARDIAC CATHETERIZATION N/A 05/18/2016   Procedure: Left Heart Cath and Coronary Angiography;  Surgeon: Jettie Booze, MD;  Location: [name];  Service: Cardiovascular;  Laterality: N/A;  . CARDIAC CATHETERIZATION N/A 05/18/2016   Procedure: Coronary Stent Intervention;  Surgeon: Jettie Booze, MD;  Location: [facility];  Service: Cardiovascular;  Laterality: N/A;  . COLONOSCOPY W/ BIOPSIES AND POLYPECTOMY    . CORONARY ANGIOPLASTY    . CORONARY ANGIOPLASTY WITH STENT PLACEMENT    . CORONARY STENT INTERVENTION N/A 09/07/2017   Procedure: CORONARY STENT INTERVENTION;  Surgeon: Leonie Man, MD;  Location: [facility];  Service: Cardiovascular;  Laterality: N/A;  . EYE SURGERY    . GAS  INSERTION Left 01/13/2018   Procedure: INSERTION OF GAS;  Surgeon: Bernarda Caffey, MD;  Location: [employer] [facility];  Service: Ophthalmology;  Laterality: Left;  . LEFT HEART CATH AND CORONARY ANGIOGRAPHY N/A 09/07/2017   Procedure: LEFT HEART CATH AND CORONARY ANGIOGRAPHY;  Surgeon: Leonie Man, MD;  Location: [facility];  Service: Cardiovascular;  Laterality: N/A;  . MEMBRANE PEEL Left 01/13/2018   Procedure: MEMBRANE PEEL LEFT EYE ;  Surgeon: Bernarda Caffey, MD;  Location: [facility];  Service: Ophthalmology;  Laterality: Left;  Marland Kitchen MULTIPLE TOOTH EXTRACTIONS    . PARS PLANA VITRECTOMY Left 01/13/2018   Procedure: PARS PLANA VITRECTOMY WITH 25 GAUGE LEFT EYE WITH ENDOLASER;  Surgeon: Bernarda Caffey, MD;  Location: [facility];  Service: Ophthalmology;  Laterality: Left;  . TUBAL LIGATION  1980  .  VAGINAL HYSTERECTOMY  1999   "partial; fibroids"    FAMILY HISTORY Family History  Problem Relation Age of Onset  . Diabetes Mother   . Hypertension Mother   . COPD Mother   . Heart failure Mother   . Hypertension Sister   . Hypertension Brother     SOCIAL HISTORY Social History   Tobacco Use  . Smoking status: Never Smoker  . Smokeless tobacco: Never Used  Substance Use Topics  . Alcohol use: No    Alcohol/week: 0.0 standard drinks  . Drug use: No         OPHTHALMIC EXAM:  Base Eye Exam    Visual Acuity (Snellen - Linear)      Right Left   Dist cc 20/70 20/HM   Dist ph cc 20/60-1 20/NI   Correction:  Glasses       Tonometry (Tonopen, 8:27 AM)      Right Left   Pressure 20 24       Pupils      Dark Light Shape React APD   Right 4 3 Round Slow None   Left 5 5 Round Minimal None       Visual Fields      Left Right     Full   Restrictions Total superior temporal, inferior temporal, inferior nasal deficiencies        Extraocular Movement      Right Left    Full, Ortho Full, Ortho       Neuro/Psych    Oriented x3:  Yes   Mood/Affect:  Normal       Dilation    Both eyes:   1.0% Mydriacyl, 2.5% Phenylephrine @ 8:27 AM        Slit Lamp and Fundus Exam    Slit Lamp Exam      Right Left   Lids/Lashes Dermatochalasis - upper lid, mild Meibomian gland dysfunction Dermatochalasis - upper lid, Meibomian gland dysfunction   Conjunctiva/Sclera White and quiet White and quiet   Cornea Arcus, 2-3+ Punctate epithelial erosions Arcus, 2+ Punctate epithelial erosions, EBMD with 4x5DD irregular epi inferiorly, pigmented [place], mild corneal haze   Anterior Chamber Deep and quiet Deep and quiet   Iris Round and dilated, No NVI Round and dilated, No NVI, persistent pupillary membrane   Lens 2-3+ Nuclear sclerosis, 2+ Cortical cataract, Vacuoles, iridescent refractile opacities 3-4+ brunescent Nuclear sclerosis, 2+ Cortical cataract, Vacuoles, iridescent refractile opacities, 1+ Posterior subcapsular cataract   Vitreous Vitreous syneresis, mild Asteroid hyalosis superiorly Post vitrectomy, +pigment in anterior vitreous       Fundus Exam      Right Left   Disc Pink and Sharp, Compact, Sharp rim mild pallor, very thin rim, inferior PPA   C/D Ratio 0.5 0.9   Macula +central edema, scattered MA/exudates - mild improvement Blunted foveal reflex, scattered Microaneurysms, mild improvement in cystic changes   Vessels Vascular attenuation, Tortuous Attenuated   Periphery Attached, scattered DBH; light PRP 360 attached; good 360 PRP in place; IRH superiorly, scattered dot hemorrhages        Refraction    Wearing Rx      Sphere Cylinder Axis   Right -4.00 +1.50 178   Left -4.00 +1.25 009   Type:  SVL          IMAGING AND PROCEDURES  Imaging and Procedures for 12/07/17  OCT, Retina - OU - Both Eyes       Right Eye Quality was borderline. Central Foveal Thickness: 459.  Progression has improved. Findings include abnormal foveal contour, intraretinal fluid, vitreomacular adhesion , no SRF, outer retinal atrophy, intraretinal hyper-reflective material (Interval improvement  in IRF).   Left Eye Quality was borderline. Central Foveal Thickness: 284. Progression has improved. Findings include intraretinal fluid, no SRF, outer retinal atrophy, intraretinal hyper-reflective material, normal foveal contour (Interval improvement of IRF and foveal profile).   Notes *Images captured and stored on drive  Diagnosis / Impression:  DME OU OD with mild interval improvement OS Interval improvement of IRF and foveal profile  Clinical management:  See below  Abbreviations: NFP - Normal foveal profile. CME - cystoid macular edema. PED - pigment epithelial detachment. IRF - intraretinal fluid. SRF - subretinal fluid. EZ - ellipsoid zone. ERM - epiretinal membrane. ORA - outer retinal atrophy. ORT - outer retinal tubulation. SRHM - subretinal hyper-reflective material         Intravitreal Injection, Pharmacologic Agent - OD - Right Eye       Time Out 11/30/2018. 9:05 AM. Confirmed correct patient, procedure, site, and patient consented.   Anesthesia Topical anesthesia was used. Anesthetic medications included Lidocaine 2%, Proparacaine 0.5%.   Procedure Preparation included 5% betadine to ocular surface, eyelid speculum. A 30 gauge needle was used.   Injection:  2 mg aflibercept Alfonse Flavors) SOLN   NDC: A3590391, Lot: [id number], Expiration date: 03/30/2019   Route: Intravitreal, Site: Right Eye, Waste: 0.05 mL  Post-op Post injection exam found visual acuity of at least counting fingers. The patient tolerated the procedure well. There were no complications. The patient received written and verbal post procedure care education.                 ASSESSMENT/PLAN:    ICD-10-CM   1. Proliferative diabetic retinopathy of both eyes with macular edema associated with type 2 diabetes mellitus (HCC) R42.7062 Intravitreal Injection, Pharmacologic Agent - OD - Right Eye    aflibercept (EYLEA) SOLN 2 mg  2. Retinal edema H35.81 OCT, Retina - OU - Both Eyes  3.  Vitreous hemorrhage of left eye (HCC) H43.12   4. 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 Essential hypertension I10   6. Hypertensive retinopathy of both eyes H35.033   7. Combined forms of age-related cataract of both eyes H25.813   8. Primary open angle glaucoma (POAG) of left eye, severe stage H40.1123   9. 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 Proliferative diabetic retinopathy with diabetic macular edema, OU  - initial FA 3.13.19 shows NVE OU  - OCT w/ diabetic macular edema, OU  - A1c 11.2% on 04.20.20 from 11.7% (03.18.20)  - s/p IVA #1 OS (03.13.19), #2 (04.15.19), #3 (05.13.19), #4 (06.03.19), #5 (08.22.19), #6 (09.19.19), #7 (10.17.19)  - s/p IVA #1 OD (03.15.19), #2 (04.15.19), #3 (05.13.19), #4 (07.08.19)   - S/P IVE #1 OD (08.22.19) -- sample, #2 (09.19.19), #3 (10.17.19),#4 (03.25.20)  - s/p PRP OS (04.05.19), PRP fill-in OS (04.30.19)  - s/p PRP OD (03.25.20)  - s/p 25g PPV/MP/EL/Gas OS under general anesthesia for VH and TRD repair OS (06.06.19)  - lost to follow up since 11.14.19 due to health and insurance reasons  - repeat FA (3.25.2020) shows persistent NV OD (nasal)  - today, BCVA 20/60-1 OD, HM OS (OS down from 20/40 on 11.14.19)  - OCT today shows interval improvement in DME OU  - recommend IVE OD #5 today (04.22.20)  - approved for [facility] for  2020  - F/U 4 wks, DFE, OCT  3. History of Vitreous hemorrhage OS -- resolved  - secondary to #1, above  - s/p IVA OS as above  - s/p PRP OS (04.05.19 and 04.30.19)  - of note pt on warfarin   - s/p PPV w/ laser as above  4. 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 Hypertensive retinopathy OU  - discussed importance of tight BP control  - monitor  7. Combined form age-related cataract OU-   - The symptoms of cataract, surgical options, and treatments and risks were  discussed with patient.  - discussed diagnosis and progression  - significant cataract progression OS following PPV  - referred to [facility] for cat eval and management -- surgery pending due to COVID-19 restrictions  8,9. Ocular hypertension / POAG OS  - severe stage -- at previous visits (2010), c/d was 0.5 and BCVA was 20/40; on 3.25.2020, c/d was 0.9 w/ disc pallor, and BCVA HM  - suspect majority of decreased BCVA OS is due to glaucoma, but significant contributions probably from cataract and EBMD/keratopathy OS  - IOP OD: 20, OS: 24 -- questionable compliance with drops  - discussed importance of IOP control and compliance with drops  - continue brimonidine and cosopt BID OS   - also now being managed by Dr. Midge Aver    Ophthalmic Meds Ordered this visit:  Meds ordered this encounter  Medications  . brimonidine (ALPHAGAN) 0.2 % ophthalmic solution    Sig: Place 1 drop into the left eye 2 (two) times a day.    Dispense:  15 mL    Refill:  10  . aflibercept (EYLEA) SOLN 2 mg       Return in about 4 weeks (around 12/28/2018) for f/u PDR OU, DFE, OCT.  There are no Patient Instructions on file for this visit.   Explained the diagnoses, plan, and follow up with the patient and they expressed understanding.  Patient expressed understanding of the importance of proper follow up care.   This document serves as a record of services personally performed by Gardiner Sleeper, MD, PhD. It was created on their behalf by Ernest Mallick, OA, an ophthalmic assistant. The creation of this record is the provider's dictation and/or activities during the visit.    Electronically signed by: Ernest Mallick, OA  04.20.2020 9:10 AM    Gardiner Sleeper, M.D., Ph.D. Diseases & Surgery of the Retina and Vitreous [facility]  I have reviewed the above documentation for accuracy and completeness, and I agree with the above. Gardiner Sleeper, M.D., Ph.D. 11/30/18 9:14  AM    Abbreviations: M myopia (nearsighted); A astigmatism; H hyperopia (farsighted); P presbyopia; Mrx spectacle prescription;  CTL contact lenses; OD right eye; OS left eye; OU both eyes  XT exotropia; ET esotropia; PEK punctate epithelial keratitis; PEE punctate epithelial erosions; DES dry eye syndrome; MGD meibomian gland dysfunction; ATs artificial tears; PFAT's preservative free artificial tears; [place] nuclear sclerotic cataract; PSC posterior subcapsular cataract; ERM epi-retinal membrane; PVD posterior vitreous detachment; RD retinal detachment; DM diabetes mellitus; DR diabetic retinopathy; NPDR non-proliferative diabetic retinopathy; PDR proliferative diabetic retinopathy; CSME clinically significant macular edema; DME diabetic macular edema; dbh dot blot hemorrhages; CWS cotton wool spot; POAG primary open angle glaucoma; C/D cup-to-disc ratio; HVF humphrey visual field; GVF goldmann visual field; OCT optical coherence tomography; IOP intraocular  pressure; BRVO Branch retinal vein occlusion; CRVO central retinal vein occlusion; CRAO central retinal artery occlusion; BRAO branch retinal artery occlusion; RT retinal tear; SB scleral buckle; PPV pars plana vitrectomy; VH Vitreous hemorrhage; PRP panretinal laser photocoagulation; IVK intravitreal kenalog; VMT vitreomacular traction; MH Macular hole;  NVD neovascularization of the disc; NVE neovascularization elsewhere; AREDS age related eye disease study; ARMD age related macular degeneration; POAG primary open angle glaucoma; EBMD epithelial/anterior basement membrane dystrophy; ACIOL anterior chamber intraocular lens; IOL intraocular lens; PCIOL posterior chamber intraocular lens; Phaco/IOL phacoemulsification with intraocular lens placement; [place] photorefractive keratectomy; LASIK laser assisted in situ keratomileusis; HTN hypertension; DM diabetes mellitus; COPD chronic obstructive pulmonary disease

## 2018-11-30 ENCOUNTER — Other Ambulatory Visit: Payer: Self-pay

## 2018-11-30 ENCOUNTER — Ambulatory Visit (INDEPENDENT_AMBULATORY_CARE_PROVIDER_SITE_OTHER): Payer: Medicaid Other | Admitting: Ophthalmology

## 2018-11-30 ENCOUNTER — Encounter (INDEPENDENT_AMBULATORY_CARE_PROVIDER_SITE_OTHER): Payer: Self-pay | Admitting: Ophthalmology

## 2018-11-30 DIAGNOSIS — H40052 Ocular hypertension, left eye: Secondary | ICD-10-CM

## 2018-11-30 DIAGNOSIS — H3342 Traction detachment of retina, left eye: Secondary | ICD-10-CM | POA: Diagnosis not present

## 2018-11-30 DIAGNOSIS — I10 Essential (primary) hypertension: Secondary | ICD-10-CM

## 2018-11-30 DIAGNOSIS — H3581 Retinal edema: Secondary | ICD-10-CM

## 2018-11-30 DIAGNOSIS — E113513 Type 2 diabetes mellitus with proliferative diabetic retinopathy with macular edema, bilateral: Secondary | ICD-10-CM

## 2018-11-30 DIAGNOSIS — H4312 Vitreous hemorrhage, left eye: Secondary | ICD-10-CM | POA: Diagnosis not present

## 2018-11-30 DIAGNOSIS — H401123 Primary open-angle glaucoma, left eye, severe stage: Secondary | ICD-10-CM

## 2018-11-30 DIAGNOSIS — H18529 Epithelial (juvenile) corneal dystrophy, unspecified eye: Secondary | ICD-10-CM

## 2018-11-30 DIAGNOSIS — H25813 Combined forms of age-related cataract, bilateral: Secondary | ICD-10-CM

## 2018-11-30 DIAGNOSIS — H1852 Epithelial (juvenile) corneal dystrophy: Secondary | ICD-10-CM

## 2018-11-30 DIAGNOSIS — H35033 Hypertensive retinopathy, bilateral: Secondary | ICD-10-CM

## 2018-11-30 LAB — URINE CULTURE

## 2018-11-30 MED ORDER — BRIMONIDINE TARTRATE 0.2 % OP SOLN: 1.0000 [drp] | Freq: Two times a day (BID) | OPHTHALMIC | 10 refills | Status: DC

## 2018-11-30 MED ORDER — AFLIBERCEPT 2MG/0.05ML IZ SOLN FOR KALEIDOSCOPE
2.0000 mg | INTRAVITREAL | Status: AC | PRN
  Administered 2018-11-30: 2 mg via INTRAVITREAL

## 2018-11-30 MED FILL — BRIMONIDINE 0.2% EYE DROP: 0.2 | 35 days supply | Qty: 5 | Fill #0

## 2018-12-01 DIAGNOSIS — H547 Unspecified visual loss: Secondary | ICD-10-CM | POA: Insufficient documentation

## 2018-12-01 DIAGNOSIS — E119 Type 2 diabetes mellitus without complications: Secondary | ICD-10-CM | POA: Insufficient documentation

## 2018-12-01 DIAGNOSIS — R600 Localized edema: Secondary | ICD-10-CM | POA: Insufficient documentation

## 2018-12-01 DIAGNOSIS — R609 Edema, unspecified: Secondary | ICD-10-CM | POA: Insufficient documentation

## 2018-12-01 DIAGNOSIS — N6320 Unspecified lump in the left breast, unspecified quadrant: Secondary | ICD-10-CM | POA: Insufficient documentation

## 2018-12-01 DIAGNOSIS — Z794 Long term (current) use of insulin: Secondary | ICD-10-CM | POA: Insufficient documentation

## 2018-12-01 DIAGNOSIS — R634 Abnormal weight loss: Secondary | ICD-10-CM | POA: Insufficient documentation

## 2018-12-15 NOTE — Telephone Encounter (Signed)
Message sent to provider 

## 2018-12-22 MED FILL — OFLOXACIN 0.3% EYE DROPS: 0.3 | 25 days supply | Qty: 5 | Fill #0

## 2018-12-28 ENCOUNTER — Ambulatory Visit (INDEPENDENT_AMBULATORY_CARE_PROVIDER_SITE_OTHER): Payer: Medicaid Other | Admitting: Ophthalmology

## 2018-12-28 ENCOUNTER — Other Ambulatory Visit: Payer: Self-pay

## 2018-12-28 ENCOUNTER — Encounter (INDEPENDENT_AMBULATORY_CARE_PROVIDER_SITE_OTHER): Payer: Self-pay | Admitting: Ophthalmology

## 2018-12-28 DIAGNOSIS — H3581 Retinal edema: Secondary | ICD-10-CM | POA: Diagnosis not present

## 2018-12-28 DIAGNOSIS — E113513 Type 2 diabetes mellitus with proliferative diabetic retinopathy with macular edema, bilateral: Secondary | ICD-10-CM | POA: Diagnosis not present

## 2018-12-28 DIAGNOSIS — H1852 Epithelial (juvenile) corneal dystrophy: Secondary | ICD-10-CM | POA: Diagnosis not present

## 2018-12-28 DIAGNOSIS — H401123 Primary open-angle glaucoma, left eye, severe stage: Secondary | ICD-10-CM

## 2018-12-28 DIAGNOSIS — H25813 Combined forms of age-related cataract, bilateral: Secondary | ICD-10-CM | POA: Diagnosis not present

## 2018-12-28 DIAGNOSIS — H18529 Epithelial (juvenile) corneal dystrophy, unspecified eye: Secondary | ICD-10-CM

## 2018-12-28 DIAGNOSIS — H3342 Traction detachment of retina, left eye: Secondary | ICD-10-CM

## 2018-12-28 DIAGNOSIS — H35033 Hypertensive retinopathy, bilateral: Secondary | ICD-10-CM | POA: Diagnosis not present

## 2018-12-28 DIAGNOSIS — H4312 Vitreous hemorrhage, left eye: Secondary | ICD-10-CM | POA: Diagnosis not present

## 2018-12-28 DIAGNOSIS — H40052 Ocular hypertension, left eye: Secondary | ICD-10-CM | POA: Diagnosis not present

## 2018-12-28 DIAGNOSIS — I10 Essential (primary) hypertension: Secondary | ICD-10-CM | POA: Diagnosis not present

## 2018-12-28 MED ORDER — TRIAMCINOLONE ACETONIDE 40 MG/ML IO SUSP
4.0000 mg | INTRAOCULAR | Status: AC | PRN
  Administered 2018-12-28: 4 mg via INTRAVITREAL

## 2018-12-28 NOTE — Progress Notes (Signed)
[facility]  12/28/2018     CHIEF COMPLAINT Patient presents for Retina Follow Up s/p 25g PPV/MP/EL/Gas OS for TRD and VH, 01/13/18  HISTORY OF PRESENT ILLNESS: Amber Young is a 63 y.o. female who presents to the clinic today for:   HPI    Retina Follow Up    Patient presents with  Diabetic Retinopathy.  In both eyes.  Severity is moderate.  Duration of 4 weeks.  Since onset it is stable.  I, the attending physician,  performed the HPI with the patient and updated documentation appropriately.          Comments    Patient states vision the same OU. Last a1c was 11.2 in April 2020.        Last edited by Bernarda Caffey, MD on 12/28/2018  1:06 PM. (History)    pt states she uses Cosopt and brimonidine in her left eye and uses a gray top and white top drop in her right eye  Referring physician: Azzie Glatter, [place], [place] [address]  HISTORICAL INFORMATION:   Selected notes from the MEDICAL RECORD NUMBER Referred by Molli Barrows, FNP for DM exam;  LEE-  Ocular Hx-  PMH- DM (A1C - 9.4 (02.13.19)), CKD, HTN, CAD, CHF, hx of stroke    CURRENT MEDICATIONS: Current Outpatient Medications (Ophthalmic Drugs)  Medication Sig  . brimonidine (ALPHAGAN) 0.2 % ophthalmic solution Place 1 drop into the left eye 2 (two) times a day.  . dorzolamide-timolol (COSOPT) 22.3-6.8 MG/ML ophthalmic solution Place 1 drop into the left eye 2 (two) times daily.   Current Facility-Administered Medications (Ophthalmic Drugs)  Medication Route  . aflibercept (EYLEA) SOLN 2 mg Intravitreal  . aflibercept (EYLEA) SOLN 2 mg Intravitreal  . aflibercept (EYLEA) SOLN 2 mg Intravitreal  . aflibercept (EYLEA) SOLN 2 mg Intravitreal   Current Outpatient Medications (Other)  Medication Sig  . amLODipine (NORVASC) 10 MG tablet Take 1 tablet (10 mg total) by mouth daily.  Marland Kitchen atorvastatin (LIPITOR) 40 MG tablet Take 1 tablet (40 mg total) by mouth  daily at 6 PM.  . carvedilol (COREG) 25 MG tablet Take 0.5 tablets (12.5 mg total) by mouth 2 (two) times daily.  . ferrous sulfate (FERROUSUL) 325 (65 FE) MG tablet Take 1 tablet (325 mg total) by mouth every other day.  . furosemide (LASIX) 40 MG tablet Take 1 tablet (40 mg total) by mouth daily.  Marland Kitchen glipiZIDE (GLUCOTROL) 10 MG tablet Take 1 tablet (10 mg total) by mouth 2 (two) times daily before a meal.  . Glucose Blood (BLOOD GLUCOSE TEST STRIPS) STRP Use as directed  . hydrALAZINE (APRESOLINE) 50 MG tablet Take 1.5 tablets (75 mg total) by mouth 3 (three) times daily.  . hydrochlorothiazide (HYDRODIURIL) 25 MG tablet Take 1 tablet (25 mg total) by mouth daily.  . insulin glargine (LANTUS) 100 UNIT/ML injection Inject 0.2 mLs (20 Units total) into the skin 2 (two) times daily.  . Insulin Pen Needle 29G X 10MM MISC Use as directed  . INSULIN SYRINGE .5CC/29G 29G X 1/2" 0.5 ML MISC Use to administer insulin three times daily  . metFORMIN (GLUCOPHAGE) 500 MG tablet Take 1 tablet (500 mg total) by mouth 2 (two) times daily with a meal.  . nitroGLYCERIN (NITROSTAT) 0.4 MG SL tablet Place 1 tablet (0.4 mg total) under the tongue every 5 (five) minutes x 3 doses as needed for chest pain.  . potassium chloride (K-DUR,KLOR-CON)  10 MEQ tablet Take 1 tablet with each dose of furosemide, daily.  . ticagrelor (BRILINTA) 90 MG TABS tablet Take 1 tablet (90 mg total) by mouth 2 (two) times daily.  . TRUEPLUS PEN NEEDLES 31G X 6 MM MISC USE AS DIRECTED  . warfarin (COUMADIN) 5 MG tablet Take 1 to 1.5 tablets by mouth daily as directed by coumadin clinic   Current Facility-Administered Medications (Other)  Medication Route  . Bevacizumab (AVASTIN) SOLN 1.25 mg Intravitreal  . Bevacizumab (AVASTIN) SOLN 1.25 mg Intravitreal  . Bevacizumab (AVASTIN) SOLN 1.25 mg Intravitreal  . Bevacizumab (AVASTIN) SOLN 1.25 mg Intravitreal  . Bevacizumab (AVASTIN) SOLN 1.25 mg Intravitreal      REVIEW OF  SYSTEMS: ROS    Positive for: Endocrine, Eyes   Negative for: Constitutional, Gastrointestinal, Neurological, Skin, Genitourinary, Musculoskeletal, HENT, Cardiovascular, Respiratory, Psychiatric, Allergic/Imm, Heme/Lymph   Last edited by Roselee Nova D on 12/28/2018  9:10 AM. (History)       ALLERGIES Allergies  Allergen Reactions  . Ace Inhibitors Cough    PAST MEDICAL HISTORY Past Medical History:  Diagnosis Date  . Anemia   . CHF (congestive heart failure) ([facility])   . CKD (chronic kidney disease), stage III ([facility])   . Coronary artery disease   . GERD (gastroesophageal reflux disease)   . Heart murmur   . Hyperlipidemia   . Hypertension   . Proliferative diabetic retinopathy ([facility])    left eye with vitreous hemorrhage and tractional retinal detachment  . Stroke ([name]) 09/2014   numbness left upper lip, finger tips on left hand; "resolved" (05/18/2016)  . Type II diabetes mellitus ([facility])   . Wears glasses    Past Surgical History:  Procedure Laterality Date  . CARDIAC CATHETERIZATION N/A 05/18/2016   Procedure: Left Heart Cath and Coronary Angiography;  Surgeon: Jettie Booze, MD;  Location: [facility];  Service: Cardiovascular;  Laterality: N/A;  . CARDIAC CATHETERIZATION N/A 05/18/2016   Procedure: Coronary Stent Intervention;  Surgeon: Jettie Booze, MD;  Location: [facility];  Service: Cardiovascular;  Laterality: N/A;  . COLONOSCOPY W/ BIOPSIES AND POLYPECTOMY    . CORONARY ANGIOPLASTY    . CORONARY ANGIOPLASTY WITH STENT PLACEMENT    . CORONARY STENT INTERVENTION N/A 09/07/2017   Procedure: CORONARY STENT INTERVENTION;  Surgeon: Leonie Man, MD;  Location: [facility];  Service: Cardiovascular;  Laterality: N/A;  . EYE SURGERY    . GAS INSERTION Left 01/13/2018   Procedure: INSERTION OF GAS;  Surgeon: Bernarda Caffey, MD;  Location: [facility];  Service: Ophthalmology;  Laterality: Left;  . LEFT HEART CATH AND CORONARY ANGIOGRAPHY N/A  09/07/2017   Procedure: LEFT HEART CATH AND CORONARY ANGIOGRAPHY;  Surgeon: Leonie Man, MD;  Location: [facility];  Service: Cardiovascular;  Laterality: N/A;  . MEMBRANE PEEL Left 01/13/2018   Procedure: MEMBRANE PEEL LEFT EYE ;  Surgeon: Bernarda Caffey, MD;  Location: [facility];  Service: Ophthalmology;  Laterality: Left;  Marland Kitchen MULTIPLE TOOTH EXTRACTIONS    . PARS PLANA VITRECTOMY Left 01/13/2018   Procedure: PARS PLANA VITRECTOMY WITH 25 GAUGE LEFT EYE WITH ENDOLASER;  Surgeon: Bernarda Caffey, MD;  Location: [facility];  Service: Ophthalmology;  Laterality: Left;  . TUBAL LIGATION  1980  . VAGINAL HYSTERECTOMY  1999   "partial; fibroids"    FAMILY HISTORY Family History  Problem Relation Age of Onset  . Diabetes Mother   . Hypertension Mother   . COPD Mother   .  Heart failure Mother   . Hypertension Sister   . Hypertension Brother     SOCIAL HISTORY Social History   Tobacco Use  . Smoking status: Never Smoker  . Smokeless tobacco: Never Used  Substance Use Topics  . Alcohol use: No    Alcohol/week: 0.0 standard drinks  . Drug use: No         OPHTHALMIC EXAM:  Base Eye Exam    Visual Acuity (Snellen - Linear)      Right Left   Dist cc 20/60 -1 HM   Dist ph cc 20/50 NI   Correction:  Glasses       Tonometry (Tonopen, 9:19 AM)      Right Left   Pressure 16 18       Pupils      Dark Light Shape React APD   Right 4 3 Round Brisk None   Left 4 4 Round Minimal None       Visual Fields (Counting fingers)      Left Right     Full   Restrictions Total superior temporal, inferior temporal, superior nasal, inferior nasal deficiencies        Extraocular Movement      Right Left    Full, Ortho Full, Ortho       Neuro/Psych    Oriented x3:  Yes   Mood/Affect:  Normal       Dilation    Both eyes:  1.0% Mydriacyl, 2.5% Phenylephrine @ 9:19 AM        Slit Lamp and Fundus Exam    Slit Lamp Exam      Right Left   Lids/Lashes Dermatochalasis - upper lid,  mild Meibomian gland dysfunction Dermatochalasis - upper lid, Meibomian gland dysfunction   Conjunctiva/Sclera White and quiet White and quiet   Cornea Arcus, 3+ Punctate epithelial erosions Arcus, 3-4+ Punctate epithelial erosions, EBMD with 4x5DD irregular epi inferiorly, pigmented [place], mild corneal haze   Anterior Chamber Deep and quiet Deep and quiet   Iris Round and dilated, No NVI Round and dilated, No NVI, persistent pupillary membrane   Lens 2-3+ Nuclear sclerosis, 2+ Cortical cataract, Vacuoles, iridescent refractile opacities 3-4+ brunescent Nuclear sclerosis, 2+ Cortical cataract, Vacuoles, iridescent refractile opacities, 1+ Posterior subcapsular cataract   Vitreous Vitreous syneresis, mild Asteroid hyalosis/silicone oil bubbles superiorly Post vitrectomy, +pigment in anterior vitreous       Fundus Exam      Right Left   Disc Pink and Sharp, Compact, Sharp rim mild pallor, very thin rim, inferior PPA   C/D Ratio 0.5 0.9   Macula +central edema, persistent scattered MA/exudates Flat, Blunted foveal reflex, scattered Microaneurysms, mild persistent cystic changes   Vessels Vascular attenuation, Tortuous Attenuated   Periphery Attached, scattered DBH; light PRP 360 attached; good 360 PRP in place; IRH superiorly, scattered dot hemorrhages        Refraction    Wearing Rx      Sphere Cylinder Axis   Right -4.00 +1.50 178   Left -4.00 +1.25 009   Type:  SVL          IMAGING AND PROCEDURES  Imaging and Procedures for 12/07/17  OCT, Retina - OU - Both Eyes       Right Eye Quality was borderline. Central Foveal Thickness: 441. Progression has improved. Findings include abnormal foveal contour, intraretinal fluid, vitreomacular adhesion , no SRF, outer retinal atrophy, intraretinal hyper-reflective material (Mild Interval improvement in IRF).   Left Eye Quality was borderline.  Central Foveal Thickness: 286. Progression has improved. Findings include intraretinal fluid,  no SRF, outer retinal atrophy, intraretinal hyper-reflective material, normal foveal contour (Interval improvement of IRF and foveal profile).   Notes *Images captured and stored on drive  Diagnosis / Impression:  DME OU OD with mild interval improvement in IRF OS Interval improvement of IRF and foveal profile  Clinical management:  See below  Abbreviations: NFP - Normal foveal profile. CME - cystoid macular edema. PED - pigment epithelial detachment. IRF - intraretinal fluid. SRF - subretinal fluid. EZ - ellipsoid zone. ERM - epiretinal membrane. ORA - outer retinal atrophy. ORT - outer retinal tubulation. SRHM - subretinal hyper-reflective material         Intravitreal Injection, Pharmacologic Agent - OD - Right Eye       Time Out 12/28/2018. 10:41 AM. Confirmed correct patient, procedure, site, and patient consented.   Anesthesia Topical anesthesia was used. Anesthetic medications included Lidocaine 2%, Proparacaine 0.5%.   Procedure Preparation included 5% betadine to ocular surface, eyelid speculum. A 27 gauge needle was used.   Injection:  4 mg Triescence 40mg /mlL injection   NDC: [phone], Lot: [id number], Expiration date: 09/29/2020   Route: Intravitreal, Site: Right Eye, Waste: 0.9 mL  Post-op Post injection exam found visual acuity of at least counting fingers. The patient tolerated the procedure well. There were no complications. The patient received written and verbal post procedure care education.   Notes 0.1 cc of triescence (40mg /mL) injected 66mm posterior to limbus, inferotemporal quadrant, into vitreous cavity without complication.  An AC tap was performed following injection due to elevated IOP using a 30 gauge needle on a syringe with the plunger removed. The needle was placed at the limbus at 7 oclock and approximately 0.08 cc of aqueous was removed from the anterior chamber. Betadine was applied to the tap area before and after the paracentesis was  performed. There were no complications. The patient tolerated the procedure well. The IOP was rechecked and was found to be 7 mmHg by tonopen.                 ASSESSMENT/PLAN:    ICD-10-CM   1. Proliferative diabetic retinopathy of both eyes with macular edema associated with type 2 diabetes mellitus (HCC) B28.4132 Intravitreal Injection, Pharmacologic Agent - OD - Right Eye    triamcinolone acetonide (TRIESENCE) 40 MG/ML subtenons injection 4 mg  2. Retinal edema H35.81 OCT, Retina - OU - Both Eyes  3. Vitreous hemorrhage of left eye (HCC) H43.12   4. 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 Essential hypertension I10   6. Hypertensive retinopathy of both eyes H35.033   7. Combined forms of age-related cataract of both eyes H25.813   8. Primary open angle glaucoma (POAG) of left eye, severe stage H40.1123   9. 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 Proliferative diabetic retinopathy with diabetic macular edema, OU  - initial FA 3.13.19 shows NVE OU  - OCT w/ diabetic macular edema, OU  - A1c 11.2% on 04.20.20 from 11.7% (03.18.20)  - s/p IVA #1 OS (03.13.19), #2 (04.15.19), #3 (05.13.19), #4 (06.03.19), #5 (08.22.19), #6 (09.19.19), #7 (10.17.19)  - s/p IVA #1 OD (03.15.19), #2 (04.15.19), #3 (05.13.19), #4 (07.08.19)   - S/P IVE #1 OD (08.22.19) -- sample, #2 (09.19.19), #3 (10.17.19), #4 (03.25.20), #5 (04.22.20)  - s/p PRP OS (04.05.19), PRP fill-in OS (04.30.19)  - s/p PRP  OD (03.25.20)  - s/p 25g PPV/MP/EL/Gas OS under general anesthesia for VH and TRD repair OS (06.06.19)  - lost to follow up following 11.14.19 visit due to health and insurance reasons  - repeat FA (3.25.2020) shows persistent NV OD (nasal)  - today, BCVA 20/50 OD, HM OS (OS down from 20/40 on 11.14.19)  - OCT today shows interval improvement in DME OU  - review of OCTs shows some resistance to IVA and IVE OD  - discussed  possibility of switch in medications to intravitreal steroid   - recommend IVT OD #5 today (05.20.20)  - RBA of procedure discussed, questions answered  - informed consent obtained and signed  - see procedure note  - F/U 6 wks, DFE, OCT  3. History of Vitreous hemorrhage OS -- resolved  - secondary to #1, above  - s/p IVA OS as above  - s/p PRP OS (04.05.19 and 04.30.19)  - of note pt on warfarin   - s/p PPV w/ laser as above  4. 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 Hypertensive retinopathy OU  - discussed importance of tight BP control  - monitor  7. Combined form age-related cataract OU-   - The symptoms of cataract, surgical options, and treatments and risks were discussed with patient.  - discussed diagnosis and progression  - significant cataract progression OS following PPV  - referred to [facility] for cat eval and management   - surgery scheduled for January 12, 2019  8,9. Ocular hypertension / POAG OS  - severe stage -- at previous visits (2010), c/d was 0.5 and BCVA was 20/40; on 3.25.2020, c/d was 0.9 w/ disc pallor, and BCVA HM  - suspect majority of decreased BCVA OS is due to glaucoma, but significant contributions probably from cataract and EBMD/keratopathy OS  - IOP OD: 16, OS: 18 -- questionable compliance with drops  - discussed importance of IOP control and compliance with drops  - continue brimonidine and cosopt BID OU   - also now being managed by Dr. Midge Aver    Ophthalmic Meds Ordered this visit:  Meds ordered this encounter  Medications  . triamcinolone acetonide (TRIESENCE) 40 MG/ML subtenons injection 4 mg       Return in about 6 weeks (around 02/08/2019) for f/u PDR OU, DFE, OCT.  There are no Patient Instructions on file for this visit.   Explained the diagnoses, plan, and follow up with the patient and they expressed understanding.  Patient expressed  understanding of the importance of proper follow up care.   This document serves as a record of services personally performed by Gardiner Sleeper, MD, PhD. It was created on their behalf by Ernest Mallick, OA, an ophthalmic assistant. The creation of this record is the provider's dictation and/or activities during the visit.    Electronically signed by: Ernest Mallick, OA  05.20.2020 1:13 PM    Gardiner Sleeper, M.D., Ph.D. Diseases & Surgery of the Retina and Vitreous [facility]  I have reviewed the above documentation for accuracy and completeness, and I agree with the above. Gardiner Sleeper, M.D., Ph.D. 12/28/18 1:13 PM   Abbreviations: M myopia (nearsighted); A astigmatism; H hyperopia (farsighted); P presbyopia; Mrx spectacle prescription;  CTL contact lenses; OD right eye; OS left eye; OU both eyes  XT exotropia; ET esotropia; PEK punctate epithelial keratitis; PEE punctate epithelial erosions;  DES dry eye syndrome; MGD meibomian gland dysfunction; ATs artificial tears; PFAT's preservative free artificial tears; [place] nuclear sclerotic cataract; PSC posterior subcapsular cataract; ERM epi-retinal membrane; PVD posterior vitreous detachment; RD retinal detachment; DM diabetes mellitus; DR diabetic retinopathy; NPDR non-proliferative diabetic retinopathy; PDR proliferative diabetic retinopathy; CSME clinically significant macular edema; DME diabetic macular edema; dbh dot blot hemorrhages; CWS cotton wool spot; POAG primary open angle glaucoma; C/D cup-to-disc ratio; HVF humphrey visual field; GVF goldmann visual field; OCT optical coherence tomography; IOP intraocular pressure; BRVO Branch retinal vein occlusion; CRVO central retinal vein occlusion; CRAO central retinal artery occlusion; BRAO branch retinal artery occlusion; RT retinal tear; SB scleral buckle; PPV pars plana vitrectomy; VH Vitreous hemorrhage; PRP panretinal laser photocoagulation; IVK intravitreal kenalog; VMT  vitreomacular traction; MH Macular hole;  NVD neovascularization of the disc; NVE neovascularization elsewhere; AREDS age related eye disease study; ARMD age related macular degeneration; POAG primary open angle glaucoma; EBMD epithelial/anterior basement membrane dystrophy; ACIOL anterior chamber intraocular lens; IOL intraocular lens; PCIOL posterior chamber intraocular lens; Phaco/IOL phacoemulsification with intraocular lens placement; [place] photorefractive keratectomy; LASIK laser assisted in situ keratomileusis; HTN hypertension; DM diabetes mellitus; COPD chronic obstructive pulmonary disease

## 2019-01-12 DIAGNOSIS — H2512 Age-related nuclear cataract, left eye: Secondary | ICD-10-CM | POA: Diagnosis not present

## 2019-01-12 DIAGNOSIS — H401123 Primary open-angle glaucoma, left eye, severe stage: Secondary | ICD-10-CM | POA: Diagnosis not present

## 2019-01-23 ENCOUNTER — Emergency Department (HOSPITAL_COMMUNITY): Payer: Medicare Other

## 2019-01-23 ENCOUNTER — Encounter (HOSPITAL_COMMUNITY): Payer: Self-pay | Admitting: Emergency Medicine

## 2019-01-23 ENCOUNTER — Other Ambulatory Visit: Payer: Self-pay | Admitting: Family Medicine

## 2019-01-23 ENCOUNTER — Encounter: Payer: Self-pay | Admitting: Family Medicine

## 2019-01-23 ENCOUNTER — Ambulatory Visit (INDEPENDENT_AMBULATORY_CARE_PROVIDER_SITE_OTHER): Payer: Medicare Other | Admitting: Family Medicine

## 2019-01-23 ENCOUNTER — Other Ambulatory Visit: Payer: Self-pay

## 2019-01-23 ENCOUNTER — Inpatient Hospital Stay (HOSPITAL_COMMUNITY)
Admission: EM | Admit: 2019-01-23 | Discharge: 2019-01-30 | DRG: 291 | Disposition: A | Discharge status: Home / Self Care | Payer: Medicare Other | Attending: Internal Medicine | Admitting: Internal Medicine

## 2019-01-23 VITALS — BP 196/102 | HR 97 | Temp 97.6°F | Ht 66.0 in | Wt 194.0 lb

## 2019-01-23 DIAGNOSIS — I130 Hypertensive heart and chronic kidney disease with heart failure and stage 1 through stage 4 chronic kidney disease, or unspecified chronic kidney disease: Secondary | ICD-10-CM | POA: Diagnosis not present

## 2019-01-23 DIAGNOSIS — N184 Chronic kidney disease, stage 4 (severe): Secondary | ICD-10-CM | POA: Diagnosis present

## 2019-01-23 DIAGNOSIS — R609 Edema, unspecified: Secondary | ICD-10-CM

## 2019-01-23 DIAGNOSIS — I480 Paroxysmal atrial fibrillation: Secondary | ICD-10-CM | POA: Diagnosis present

## 2019-01-23 DIAGNOSIS — E1165 Type 2 diabetes mellitus with hyperglycemia: Secondary | ICD-10-CM | POA: Diagnosis present

## 2019-01-23 DIAGNOSIS — I5043 Acute on chronic combined systolic (congestive) and diastolic (congestive) heart failure: Secondary | ICD-10-CM | POA: Diagnosis not present

## 2019-01-23 DIAGNOSIS — I509 Heart failure, unspecified: Secondary | ICD-10-CM

## 2019-01-23 DIAGNOSIS — I10 Essential (primary) hypertension: Secondary | ICD-10-CM

## 2019-01-23 DIAGNOSIS — K219 Gastro-esophageal reflux disease without esophagitis: Secondary | ICD-10-CM | POA: Diagnosis not present

## 2019-01-23 DIAGNOSIS — E119 Type 2 diabetes mellitus without complications: Secondary | ICD-10-CM

## 2019-01-23 DIAGNOSIS — I499 Cardiac arrhythmia, unspecified: Secondary | ICD-10-CM | POA: Diagnosis not present

## 2019-01-23 DIAGNOSIS — Z825 Family history of asthma and other chronic lower respiratory diseases: Secondary | ICD-10-CM

## 2019-01-23 DIAGNOSIS — Z8673 Personal history of transient ischemic attack (TIA), and cerebral infarction without residual deficits: Secondary | ICD-10-CM

## 2019-01-23 DIAGNOSIS — E876 Hypokalemia: Secondary | ICD-10-CM | POA: Diagnosis not present

## 2019-01-23 DIAGNOSIS — Z7902 Long term (current) use of antithrombotics/antiplatelets: Secondary | ICD-10-CM

## 2019-01-23 DIAGNOSIS — E113599 Type 2 diabetes mellitus with proliferative diabetic retinopathy without macular edema, unspecified eye: Secondary | ICD-10-CM | POA: Diagnosis not present

## 2019-01-23 DIAGNOSIS — I110 Hypertensive heart disease with heart failure: Secondary | ICD-10-CM | POA: Diagnosis not present

## 2019-01-23 DIAGNOSIS — E11311 Type 2 diabetes mellitus with unspecified diabetic retinopathy with macular edema: Secondary | ICD-10-CM | POA: Insufficient documentation

## 2019-01-23 DIAGNOSIS — I160 Hypertensive urgency: Secondary | ICD-10-CM | POA: Diagnosis present

## 2019-01-23 DIAGNOSIS — R0602 Shortness of breath: Secondary | ICD-10-CM

## 2019-01-23 DIAGNOSIS — I161 Hypertensive emergency: Secondary | ICD-10-CM | POA: Diagnosis not present

## 2019-01-23 DIAGNOSIS — Z09 Encounter for follow-up examination after completed treatment for conditions other than malignant neoplasm: Secondary | ICD-10-CM

## 2019-01-23 DIAGNOSIS — R42 Dizziness and giddiness: Secondary | ICD-10-CM | POA: Diagnosis not present

## 2019-01-23 DIAGNOSIS — R8290 Unspecified abnormal findings in urine: Secondary | ICD-10-CM | POA: Diagnosis not present

## 2019-01-23 DIAGNOSIS — E1122 Type 2 diabetes mellitus with diabetic chronic kidney disease: Secondary | ICD-10-CM | POA: Diagnosis present

## 2019-01-23 DIAGNOSIS — R600 Localized edema: Secondary | ICD-10-CM

## 2019-01-23 DIAGNOSIS — R030 Elevated blood-pressure reading, without diagnosis of hypertension: Secondary | ICD-10-CM | POA: Diagnosis not present

## 2019-01-23 DIAGNOSIS — I517 Cardiomegaly: Secondary | ICD-10-CM | POA: Diagnosis not present

## 2019-01-23 DIAGNOSIS — R51 Headache: Secondary | ICD-10-CM

## 2019-01-23 DIAGNOSIS — Z20828 Contact with and (suspected) exposure to other viral communicable diseases: Secondary | ICD-10-CM | POA: Diagnosis present

## 2019-01-23 DIAGNOSIS — Z794 Long term (current) use of insulin: Secondary | ICD-10-CM

## 2019-01-23 DIAGNOSIS — E785 Hyperlipidemia, unspecified: Secondary | ICD-10-CM | POA: Diagnosis not present

## 2019-01-23 DIAGNOSIS — I2510 Atherosclerotic heart disease of native coronary artery without angina pectoris: Secondary | ICD-10-CM | POA: Diagnosis present

## 2019-01-23 DIAGNOSIS — H409 Unspecified glaucoma: Secondary | ICD-10-CM | POA: Diagnosis not present

## 2019-01-23 DIAGNOSIS — Z833 Family history of diabetes mellitus: Secondary | ICD-10-CM

## 2019-01-23 DIAGNOSIS — I272 Pulmonary hypertension, unspecified: Secondary | ICD-10-CM | POA: Diagnosis present

## 2019-01-23 DIAGNOSIS — D631 Anemia in chronic kidney disease: Secondary | ICD-10-CM | POA: Diagnosis present

## 2019-01-23 DIAGNOSIS — N179 Acute kidney failure, unspecified: Secondary | ICD-10-CM | POA: Diagnosis not present

## 2019-01-23 DIAGNOSIS — R519 Headache, unspecified: Secondary | ICD-10-CM | POA: Insufficient documentation

## 2019-01-23 DIAGNOSIS — H547 Unspecified visual loss: Secondary | ICD-10-CM | POA: Diagnosis not present

## 2019-01-23 DIAGNOSIS — IMO0002 Reserved for concepts with insufficient information to code with codable children: Secondary | ICD-10-CM | POA: Diagnosis present

## 2019-01-23 DIAGNOSIS — H269 Unspecified cataract: Secondary | ICD-10-CM | POA: Diagnosis not present

## 2019-01-23 DIAGNOSIS — Z955 Presence of coronary angioplasty implant and graft: Secondary | ICD-10-CM

## 2019-01-23 DIAGNOSIS — Z9114 Patient's other noncompliance with medication regimen: Secondary | ICD-10-CM

## 2019-01-23 DIAGNOSIS — I071 Rheumatic tricuspid insufficiency: Secondary | ICD-10-CM | POA: Diagnosis present

## 2019-01-23 DIAGNOSIS — Z8249 Family history of ischemic heart disease and other diseases of the circulatory system: Secondary | ICD-10-CM

## 2019-01-23 DIAGNOSIS — E0865 Diabetes mellitus due to underlying condition with hyperglycemia: Secondary | ICD-10-CM

## 2019-01-23 HISTORY — DX: Chronic combined systolic (congestive) and diastolic (congestive) heart failure: I50.42

## 2019-01-23 HISTORY — DX: Chronic kidney disease, stage 4 (severe): N18.4

## 2019-01-23 LAB — PROTIME-INR
INR: 1.1 (ref 0.8–1.2)
Prothrombin Time: 13.8 seconds (ref 11.4–15.2)

## 2019-01-23 LAB — BASIC METABOLIC PANEL
Anion gap: 11 (ref 5–15)
BUN: 26 mg/dL — ABNORMAL HIGH (ref 8–23)
CO2: 21 mmol/L — ABNORMAL LOW (ref 22–32)
Calcium: 8.1 mg/dL — ABNORMAL LOW (ref 8.9–10.3)
Chloride: 106 mmol/L (ref 98–111)
Creatinine, Ser: 2.58 mg/dL — ABNORMAL HIGH (ref 0.44–1.00)
GFR calc Af Amer: 22 mL/min — ABNORMAL LOW (ref >60)
GFR calc non Af Amer: 19 mL/min — ABNORMAL LOW (ref >60)
Glucose, Bld: 176 mg/dL — ABNORMAL HIGH (ref 70–99)
Potassium: 3 mmol/L — ABNORMAL LOW (ref 3.5–5.1)
Sodium: 138 mmol/L (ref 135–145)

## 2019-01-23 LAB — CBC
HCT: 35.8 % — ABNORMAL LOW (ref 36.0–46.0)
Hemoglobin: 11.1 g/dL — ABNORMAL LOW (ref 12.0–15.0)
MCH: 23.3 pg — ABNORMAL LOW (ref 26.0–34.0)
MCHC: 31 g/dL (ref 30.0–36.0)
MCV: 75.1 fL — ABNORMAL LOW (ref 80.0–100.0)
Platelets: 260 10*3/uL (ref 150–400)
RBC: 4.77 MIL/uL (ref 3.87–5.11)
RDW: 15.4 % (ref 11.5–15.5)
WBC: 4.4 10*3/uL (ref 4.0–10.5)
nRBC: 0 % (ref 0.0–0.2)

## 2019-01-23 LAB — POCT GLYCOSYLATED HEMOGLOBIN (HGB A1C): Hemoglobin A1C: 10.6 % — AB (ref 4.0–5.6)

## 2019-01-23 LAB — POCT URINALYSIS DIP (MANUAL ENTRY)
Bilirubin, UA: NEGATIVE
Glucose, UA: 500 mg/dL — AB
Ketones, POC UA: NEGATIVE mg/dL
Leukocytes, UA: NEGATIVE
Nitrite, UA: NEGATIVE
Protein Ur, POC: >=300 mg/dL — AB
Spec Grav, UA: 1.02 (ref 1.010–1.025)
Urobilinogen, UA: 0.2 E.U./dL
pH, UA: 7 (ref 5.0–8.0)

## 2019-01-23 LAB — GLUCOSE, CAPILLARY
Glucose-Capillary: 191 mg/dL — ABNORMAL HIGH (ref 70–99)
Glucose-Capillary: 134 mg/dL — ABNORMAL HIGH (ref 70–99)

## 2019-01-23 LAB — TROPONIN I: Troponin I: 0.03 ng/mL (ref <0.03)

## 2019-01-23 LAB — HEMOGLOBIN A1C
Hgb A1c MFr Bld: 10.9 % — ABNORMAL HIGH (ref 4.8–5.6)
Mean Plasma Glucose: 266.13 mg/dL

## 2019-01-23 LAB — MAGNESIUM: Magnesium: 1.6 mg/dL — ABNORMAL LOW (ref 1.7–2.4)

## 2019-01-23 LAB — BRAIN NATRIURETIC PEPTIDE: B Natriuretic Peptide: 2666.4 pg/mL — ABNORMAL HIGH (ref 0.0–100.0)

## 2019-01-23 LAB — GLUCOSE, POCT (MANUAL RESULT ENTRY): POC Glucose: 204 mg/dl — AB (ref 70–99)

## 2019-01-23 MED ORDER — ACETAMINOPHEN 500 MG PO TABS: 1000.0000 mg | ORAL_TABLET | Freq: Four times a day (QID) | ORAL | Status: DC | PRN

## 2019-01-23 MED ORDER — BRIMONIDINE TARTRATE 0.2 % OP SOLN
1.0000 [drp] | Freq: Two times a day (BID) | OPHTHALMIC | Status: DC
  Administered 2019-01-23 – 2019-01-30 (×15): 1 [drp] via OPHTHALMIC
  Filled 2019-01-23: qty 5

## 2019-01-23 MED ORDER — POTASSIUM CHLORIDE CRYS ER 10 MEQ PO TBCR
10.0000 meq | EXTENDED_RELEASE_TABLET | Freq: Two times a day (BID) | ORAL | Status: DC
  Administered 2019-01-23 – 2019-01-30 (×14): 10 meq via ORAL
  Filled 2019-01-23 (×15): qty 1

## 2019-01-23 MED ORDER — POTASSIUM CHLORIDE CRYS ER 20 MEQ PO TBCR
40.0000 meq | EXTENDED_RELEASE_TABLET | Freq: Once | ORAL | Status: AC
  Administered 2019-01-23: 40 meq via ORAL
  Filled 2019-01-23: qty 2

## 2019-01-23 MED ORDER — ENOXAPARIN SODIUM 40 MG/0.4ML ~~LOC~~ SOLN: 40.0000 mg | SUBCUTANEOUS | Status: DC

## 2019-01-23 MED ORDER — SODIUM CHLORIDE 0.9% FLUSH
3.0000 mL | INTRAVENOUS | Status: DC | PRN
  Administered 2019-01-28: 3 mL via INTRAVENOUS
  Filled 2019-01-23: qty 3

## 2019-01-23 MED ORDER — WARFARIN SODIUM 7.5 MG PO TABS
7.5000 mg | ORAL_TABLET | Freq: Once | ORAL | Status: AC
  Administered 2019-01-23: 7.5 mg via ORAL
  Filled 2019-01-23 (×2): qty 1

## 2019-01-23 MED ORDER — HYDRALAZINE HCL 20 MG/ML IJ SOLN: 10.0000 mg | INTRAMUSCULAR | Status: DC | PRN

## 2019-01-23 MED ORDER — NITROGLYCERIN 0.4 MG SL SUBL: 0.4000 mg | SUBLINGUAL_TABLET | SUBLINGUAL | Status: DC | PRN

## 2019-01-23 MED ORDER — ALPRAZOLAM 0.25 MG PO TABS: 0.2500 mg | ORAL_TABLET | Freq: Two times a day (BID) | ORAL | Status: DC | PRN

## 2019-01-23 MED ORDER — SODIUM CHLORIDE 0.9 % IV SOLN: 250.0000 mL | INTRAVENOUS | Status: DC | PRN

## 2019-01-23 MED ORDER — ACETAMINOPHEN 325 MG PO TABS
650.0000 mg | ORAL_TABLET | ORAL | Status: DC | PRN
  Administered 2019-01-24: 650 mg via ORAL
  Filled 2019-01-23: qty 2

## 2019-01-23 MED ORDER — AMLODIPINE BESYLATE 10 MG PO TABS
10.0000 mg | ORAL_TABLET | Freq: Every day | ORAL | Status: DC
  Administered 2019-01-23 – 2019-01-24 (×2): 10 mg via ORAL
  Filled 2019-01-23: qty 2
  Filled 2019-01-23: qty 1

## 2019-01-23 MED ORDER — FUROSEMIDE 10 MG/ML IJ SOLN
40.0000 mg | Freq: Once | INTRAMUSCULAR | Status: AC
  Administered 2019-01-23: 40 mg via INTRAVENOUS
  Filled 2019-01-23: qty 4

## 2019-01-23 MED ORDER — ONDANSETRON HCL 4 MG/2ML IJ SOLN
4.0000 mg | Freq: Four times a day (QID) | INTRAMUSCULAR | Status: DC | PRN
  Administered 2019-01-23: 4 mg via INTRAVENOUS
  Filled 2019-01-23: qty 2

## 2019-01-23 MED ORDER — LABETALOL HCL 5 MG/ML IV SOLN
10.0000 mg | Freq: Once | INTRAVENOUS | Status: AC
  Administered 2019-01-23: 10 mg via INTRAVENOUS
  Filled 2019-01-23: qty 4

## 2019-01-23 MED ORDER — HYDRALAZINE HCL 50 MG PO TABS
75.0000 mg | ORAL_TABLET | Freq: Three times a day (TID) | ORAL | Status: DC
  Administered 2019-01-23 – 2019-01-24 (×5): 75 mg via ORAL
  Filled 2019-01-23: qty 3
  Filled 2019-01-23 (×4): qty 1

## 2019-01-23 MED ORDER — CARVEDILOL 12.5 MG PO TABS
12.5000 mg | ORAL_TABLET | Freq: Two times a day (BID) | ORAL | Status: DC
  Administered 2019-01-23 – 2019-01-26 (×5): 12.5 mg via ORAL
  Filled 2019-01-23 (×5): qty 1

## 2019-01-23 MED ORDER — DORZOLAMIDE HCL-TIMOLOL MAL 2-0.5 % OP SOLN
1.0000 [drp] | Freq: Two times a day (BID) | OPHTHALMIC | Status: DC
  Administered 2019-01-23 – 2019-01-30 (×15): 1 [drp] via OPHTHALMIC
  Filled 2019-01-23: qty 10

## 2019-01-23 MED ORDER — INSULIN ASPART 100 UNIT/ML ~~LOC~~ SOLN
0.0000 [IU] | Freq: Every day | SUBCUTANEOUS | Status: DC
  Administered 2019-01-24: 3 [IU] via SUBCUTANEOUS

## 2019-01-23 MED ORDER — SODIUM CHLORIDE 0.9% FLUSH
3.0000 mL | Freq: Two times a day (BID) | INTRAVENOUS | Status: DC
  Administered 2019-01-23 – 2019-01-30 (×13): 3 mL via INTRAVENOUS

## 2019-01-23 MED ORDER — FERROUS SULFATE 325 (65 FE) MG PO TABS
325.0000 mg | ORAL_TABLET | ORAL | Status: DC
  Administered 2019-01-23 – 2019-01-30 (×6): 325 mg via ORAL
  Filled 2019-01-23 (×8): qty 1

## 2019-01-23 MED ORDER — INSULIN GLARGINE 100 UNIT/ML ~~LOC~~ SOLN
20.0000 [IU] | Freq: Every day | SUBCUTANEOUS | Status: DC
  Administered 2019-01-24 – 2019-01-25 (×2): 20 [IU] via SUBCUTANEOUS
  Filled 2019-01-23 (×4): qty 0.2

## 2019-01-23 MED ORDER — FUROSEMIDE 10 MG/ML IJ SOLN
40.0000 mg | Freq: Two times a day (BID) | INTRAMUSCULAR | Status: DC
  Administered 2019-01-23 – 2019-01-25 (×4): 40 mg via INTRAVENOUS
  Filled 2019-01-23 (×4): qty 4

## 2019-01-23 MED ORDER — WARFARIN - PHARMACIST DOSING INPATIENT
Freq: Every day | Status: DC
  Administered 2019-01-25 – 2019-01-27 (×3)

## 2019-01-23 MED ORDER — ATORVASTATIN CALCIUM 40 MG PO TABS
40.0000 mg | ORAL_TABLET | Freq: Every day | ORAL | Status: DC
  Administered 2019-01-23 – 2019-01-29 (×7): 40 mg via ORAL
  Filled 2019-01-23 (×8): qty 1

## 2019-01-23 MED ORDER — GLIMEPIRIDE 4 MG PO TABS: 4.0000 mg | ORAL_TABLET | Freq: Every day | ORAL | 1 refills | Status: DC

## 2019-01-23 MED ORDER — FUROSEMIDE 40 MG PO TABS: 40.0000 mg | ORAL_TABLET | Freq: Two times a day (BID) | ORAL | 1 refills | Status: DC

## 2019-01-23 MED ORDER — INSULIN ASPART 100 UNIT/ML ~~LOC~~ SOLN
0.0000 [IU] | Freq: Three times a day (TID) | SUBCUTANEOUS | Status: DC
  Administered 2019-01-23 – 2019-01-24 (×2): 3 [IU] via SUBCUTANEOUS

## 2019-01-23 MED ORDER — TICAGRELOR 90 MG PO TABS
90.0000 mg | ORAL_TABLET | Freq: Two times a day (BID) | ORAL | Status: DC
  Administered 2019-01-23 – 2019-01-30 (×14): 90 mg via ORAL
  Filled 2019-01-23 (×14): qty 1

## 2019-01-23 MED FILL — GLIMEPIRIDE 4 MG TABS: 4 | 30 days supply | Qty: 30 | Fill #0

## 2019-01-23 MED FILL — FUROSEMIDE 40 MG TAB: 40 | 30 days supply | Qty: 60 | Fill #0

## 2019-01-23 NOTE — Progress Notes (Signed)
Patient [facility]   Established Patient Office Visit  Subjective:  Patient ID: Amber Young, female    DOB: Apr 11, 1956  Age: 63 y.o. MRN: [id number]  CC:  Chief Complaint  Patient presents with  . Leg Swelling  . Dizziness    HPI Amber Young is a 63 year old female who presents for Follow Up today.   Past Medical History:  Diagnosis Date  . Anemia   . CHF (congestive heart failure) ([place])   . CKD (chronic kidney disease), stage III ([place])   . Coronary artery disease   . GERD (gastroesophageal reflux disease)   . Heart murmur   . Hyperlipidemia   . Hypertension   . Proliferative diabetic retinopathy ([place])    left eye with vitreous hemorrhage and tractional retinal detachment  . Stroke ([place]) 09/2014   numbness left upper lip, finger tips on left hand; "resolved" (05/18/2016)  . Type II diabetes mellitus ([place])   . Wears glasses    Current Status: Since her last office visit, she is doing well with no complaints.     She recently had cataract surgery on left eye. She states that she does not see any improvement in her vision in her left eye. She is scheduled for right eye cataract surgery in 1 month. She denies chest pain, cough, shortness of breath, heart palpitations, and falls. She has occasional headaches and dizziness with position changes. Denies severe headaches, confusion, seizures, double vision, and blurred vision, nausea and vomiting. She has been experiencing increased fatigue lately. She states that her left leg feels as though it is 'lagging' lately. She has c/o dizziness and increased swelling in legs, which has been present X 2 weeks.  Her blood pressures remain elevated today. She has taken all antihypertensive medications this morning prior to arriving to clinic today.   She denies fevers, chills, recent infections, weight loss, and night sweats. No reports of GI problems such as diarrhea, and constipation. She  has no reports of blood in stools, dysuria and hematuria. No depression or anxiety reported. She denies pain today.   Past Surgical History:  Procedure Laterality Date  . CARDIAC CATHETERIZATION N/A 05/18/2016   Procedure: Left Heart Cath and Coronary Angiography;  Surgeon: Jettie Booze, MD;  Location: [facility];  Service: Cardiovascular;  Laterality: N/A;  . CARDIAC CATHETERIZATION N/A 05/18/2016   Procedure: Coronary Stent Intervention;  Surgeon: Jettie Booze, MD;  Location: [facility];  Service: Cardiovascular;  Laterality: N/A;  . COLONOSCOPY W/ BIOPSIES AND POLYPECTOMY    . CORONARY ANGIOPLASTY    . CORONARY ANGIOPLASTY WITH STENT PLACEMENT    . CORONARY STENT INTERVENTION N/A 09/07/2017   Procedure: CORONARY STENT INTERVENTION;  Surgeon: Leonie Man, MD;  Location: [facility];  Service: Cardiovascular;  Laterality: N/A;  . EYE SURGERY    . GAS INSERTION Left 01/13/2018   Procedure: INSERTION OF GAS;  Surgeon: Bernarda Caffey, MD;  Location: [facility];  Service: Ophthalmology;  Laterality: Left;  . LEFT HEART CATH AND CORONARY ANGIOGRAPHY N/A 09/07/2017   Procedure: LEFT HEART CATH AND CORONARY ANGIOGRAPHY;  Surgeon: Leonie Man, MD;  Location: [facility];  Service: Cardiovascular;  Laterality: N/A;  . MEMBRANE PEEL Left 01/13/2018   Procedure: MEMBRANE PEEL LEFT EYE ;  Surgeon: Bernarda Caffey, MD;  Location: [facility];  Service: Ophthalmology;  Laterality: Left;  Marland Kitchen MULTIPLE TOOTH EXTRACTIONS    . PARS PLANA  VITRECTOMY Left 01/13/2018   Procedure: PARS PLANA VITRECTOMY WITH 25 GAUGE LEFT EYE WITH ENDOLASER;  Surgeon: Bernarda Caffey, MD;  Location: [facility];  Service: Ophthalmology;  Laterality: Left;  . TUBAL LIGATION  1980  . VAGINAL HYSTERECTOMY  1999   "partial; fibroids"    Family History  Problem Relation Age of Onset  . Diabetes Mother   . Hypertension Mother   . COPD Mother   . Heart failure Mother   . Hypertension Sister   . Hypertension  Brother     Social History   Socioeconomic History  . Marital status: Married    Spouse name: Not on file  . Number of children: Not on file  . Years of education: 50  . Highest education level: Not on file  Occupational History  . Occupation: hairdresser  Social Needs  . Financial resource strain: Not on file  . Food insecurity    Worry: Not on file    Inability: Not on file  . Transportation needs    Medical: Not on file    Non-medical: Not on file  Tobacco Use  . Smoking status: Never Smoker  . Smokeless tobacco: Never Used  Substance and Sexual Activity  . Alcohol use: No    Alcohol/week: 0.0 standard drinks  . Drug use: No  . Sexual activity: Yes  Lifestyle  . Physical activity    Days per week: Not on file    Minutes per session: Not on file  . Stress: Not on file  Relationships  . Social Herbalist on phone: Not on file    Gets together: Not on file    Attends religious service: Not on file    Active member of club or organization: Not on file    Attends meetings of clubs or organizations: Not on file    Relationship status: Not on file  . Intimate partner violence    Fear of current or ex partner: Not on file    Emotionally abused: Not on file    Physically abused: Not on file    Forced sexual activity: Not on file  Other Topics Concern  . Not on file  Social History Narrative   Married, 2 children, hairdresser   Caffeine use- occasional tea or coffee   Right handed    Outpatient Medications Prior to Visit  Medication Sig Dispense Refill  . amLODipine (NORVASC) 10 MG tablet Take 1 tablet (10 mg total) by mouth daily. 90 tablet 2  . atorvastatin (LIPITOR) 40 MG tablet Take 1 tablet (40 mg total) by mouth daily at 6 PM. 90 tablet 1  . brimonidine (ALPHAGAN) 0.2 % ophthalmic solution Place 1 drop into the left eye 2 (two) times a day. 15 mL 10  . carvedilol (COREG) 25 MG tablet Take 0.5 tablets (12.5 mg total) by mouth 2 (two) times daily. 30  tablet 0  . dorzolamide-timolol (COSOPT) 22.3-6.8 MG/ML ophthalmic solution Place 1 drop into the left eye 2 (two) times daily.    . ferrous sulfate (FERROUSUL) 325 (65 FE) MG tablet Take 1 tablet (325 mg total) by mouth every other day. 90 tablet 3  . glipiZIDE (GLUCOTROL) 10 MG tablet Take 1 tablet (10 mg total) by mouth 2 (two) times daily before a meal. 60 tablet 3  . Glucose Blood (BLOOD GLUCOSE TEST STRIPS) STRP Use as directed 200 each 0  . hydrALAZINE (APRESOLINE) 50 MG tablet Take 1.5 tablets (75 mg total) by mouth 3 (three) times  daily. 135 tablet 6  . hydrochlorothiazide (HYDRODIURIL) 25 MG tablet Take 1 tablet (25 mg total) by mouth daily. 30 tablet 2  . insulin glargine (LANTUS) 100 UNIT/ML injection Inject 0.2 mLs (20 Units total) into the skin 2 (two) times daily. 20 mL 11  . Insulin Pen Needle 29G X 10MM MISC Use as directed 100 each 3  . INSULIN SYRINGE .5CC/29G 29G X 1/2" 0.5 ML MISC Use to administer insulin three times daily 200 each 5  . nitroGLYCERIN (NITROSTAT) 0.4 MG SL tablet Place 1 tablet (0.4 mg total) under the tongue every 5 (five) minutes x 3 doses as needed for chest pain. 30 tablet 12  . potassium chloride (K-DUR,KLOR-CON) 10 MEQ tablet Take 1 tablet with each dose of furosemide, daily. 30 tablet 3  . ticagrelor (BRILINTA) 90 MG TABS tablet Take 1 tablet (90 mg total) by mouth 2 (two) times daily. 180 tablet 3  . TRUEPLUS PEN NEEDLES 31G X 6 MM MISC USE AS DIRECTED 100 each 0  . warfarin (COUMADIN) 5 MG tablet Take 1 to 1.5 tablets by mouth daily as directed by coumadin clinic 120 tablet 1  . metFORMIN (GLUCOPHAGE) 500 MG tablet Take 1 tablet (500 mg total) by mouth 2 (two) times daily with a meal. 60 tablet 3  . furosemide (LASIX) 40 MG tablet Take 1 tablet (40 mg total) by mouth daily. 90 tablet 1   Facility-Administered Medications Prior to Visit  Medication Dose Route Frequency Provider Last Rate Last Dose  . aflibercept (EYLEA) SOLN 2 mg  2 mg Intravitreal   Bernarda Caffey, MD   2 mg at 03/31/18 1056  . aflibercept (EYLEA) SOLN 2 mg  2 mg Intravitreal  Bernarda Caffey, MD   2 mg at 04/28/18 1419  . aflibercept (EYLEA) SOLN 2 mg  2 mg Intravitreal  Bernarda Caffey, MD   2 mg at 05/27/18 0028  . aflibercept (EYLEA) SOLN 2 mg  2 mg Intravitreal  Bernarda Caffey, MD   2 mg at 06/27/18 0051  . Bevacizumab (AVASTIN) SOLN 1.25 mg  1.25 mg Intravitreal  Bernarda Caffey, MD   1.25 mg at 02/14/18 1150  . Bevacizumab (AVASTIN) SOLN 1.25 mg  1.25 mg Intravitreal  Bernarda Caffey, MD   1.25 mg at 03/31/18 1057  . Bevacizumab (AVASTIN) SOLN 1.25 mg  1.25 mg Intravitreal  Bernarda Caffey, MD   1.25 mg at 04/28/18 1419  . Bevacizumab (AVASTIN) SOLN 1.25 mg  1.25 mg Intravitreal  Bernarda Caffey, MD   1.25 mg at 05/27/18 0028  . Bevacizumab (AVASTIN) SOLN 1.25 mg  1.25 mg Intravitreal  Bernarda Caffey, MD   1.25 mg at 06/27/18 0051    Allergies  Allergen Reactions  . Ace Inhibitors Cough    ROS Review of Systems  Constitutional: Negative.   HENT: Negative.   Eyes: Negative.   Respiratory: Negative.   Cardiovascular: Negative.   Gastrointestinal: Negative.   Endocrine: Negative.   Genitourinary: Negative.   Musculoskeletal: Negative.   Skin: Negative.   Allergic/Immunologic: Negative.   Neurological: Positive for dizziness (occasional) and headaches (occasional).  Hematological: Negative.   Psychiatric/Behavioral: Negative.       Objective:    Physical Exam  Constitutional: She is oriented to person, place, and time. She appears well-developed and well-nourished.  HENT:  Head: Normocephalic and atraumatic.  Eyes: Conjunctivae are normal.  Neck: Normal range of motion. Neck supple.  Cardiovascular: Normal rate, regular rhythm, normal heart sounds and intact distal pulses.  Pulmonary/Chest: Effort normal  and breath sounds normal.  Abdominal: Soft. Bowel sounds are normal.  Musculoskeletal: Normal range of motion.  Neurological: She is alert and oriented to  person, place, and time. She has normal reflexes.  Skin: Skin is warm and dry.  Psychiatric: She has a normal mood and affect. Her behavior is normal. Judgment and thought content normal.  Nursing note and vitals reviewed.   BP (!) 196/102   Pulse 97   Temp 97.6 F (36.4 C) (Oral)   Ht 5\' 6"  (1.676 m)   Wt 194 lb (88 kg)   SpO2 100%   BMI 31.31 kg/m  Wt Readings from Last 3 Encounters:  01/23/19 194 lb (88 kg)  11/28/18 186 lb 6.4 oz (84.6 kg)  10/26/18 187 lb (84.8 kg)     Health Maintenance Due  Topic Date Due  . Hepatitis C Screening  09/12/55  . PNEUMOCOCCAL POLYSACCHARIDE VACCINE AGE 30-64 HIGH RISK  04/09/1958  . OPHTHALMOLOGY EXAM  04/09/1966  . PAP SMEAR-Modifier  04/09/1977    There are no preventive care reminders to display for this patient.  Lab Results  Component Value Date   TSH 1.730 01/24/2018   Lab Results  Component Value Date   WBC 3.9 (L) 07/22/2018   HGB 8.9 (L) 07/22/2018   HCT 29.3 (L) 07/22/2018   MCV 73.1 (L) 07/22/2018   PLT 260 07/22/2018   Lab Results  Component Value Date   NA 137 07/23/2018   K 3.4 (L) 07/23/2018   CO2 18 (L) 07/23/2018   GLUCOSE 226 (H) 07/23/2018   BUN 30 (H) 07/23/2018   CREATININE 2.58 (H) 07/23/2018   BILITOT 0.6 07/21/2018   ALKPHOS 79 07/21/2018   AST 40 07/21/2018   ALT 21 07/21/2018   PROT 5.1 (L) 07/21/2018   ALBUMIN 1.8 (L) 07/21/2018   CALCIUM 7.7 (L) 07/23/2018   ANIONGAP 10 07/23/2018   Lab Results  Component Value Date   CHOL 190 01/05/2017   Lab Results  Component Value Date   HDL 80 01/05/2017   Lab Results  Component Value Date   LDLCALC 95 01/05/2017   Lab Results  Component Value Date   TRIG 74 01/05/2017   Lab Results  Component Value Date   CHOLHDL 2.4 01/05/2017   Lab Results  Component Value Date   HGBA1C 10.6 (A) 01/23/2019   Assessment & Plan:   1. Hypertensive emergency Blood pressures remain elevated today. We declined giving Clonidine 0.3 mg to patient  in office today, per protocol,  because patient took all antihypertensive medication just before arriving to her appointment this morning, and is currently experiencing increased dizziness and headaches.  We referred her to ED via ambulance transport at this time. No distress noted. Her daughter is with her today. She denies severe headaches, confusion, seizures, double vision, and blurred vision, nausea and vomiting. EKG stable as compared to last EKG on 11/2017.  - EKG 12-Lead  2. Elevated blood pressure reading - EKG 12-Lead  3. Essential hypertension  4. Type 2 diabetes mellitus without complication, with long-term current use of insulin (HCC) We will discontinue Metformin at this time and initiate  Amaryl.  - POCT glycosylated hemoglobin (Hb A1C) - POCT urinalysis dipstick - POCT glucose (manual entry) - glimepiride (AMARYL) 4 MG tablet; Take 1 tablet (4 mg total) by mouth daily with breakfast.  Dispense: 30 tablet; Refill: 1  5. Visual problems  6. Glaucoma of left eye, unspecified glaucoma type  7. 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 Cataract of both eyes, unspecified cataract type Recent cataract surgery on left eye.   9. Intractable headache, unspecified chronicity pattern, unspecified headache type   10. Edema, peripheral We will increase Lasix to 40 mg BID today.   11. Dizziness - EKG 12-Lead  12. Abnormal urinalysis Results are pending.  - Urine Culture  13. Follow up She will follow up in 1 month for office visit. She will follow up in 1 month for blood pressure recheck only.   Meds ordered this encounter  Medications  . glimepiride (AMARYL) 4 MG tablet    Sig: Take 1 tablet (4 mg total) by mouth daily with breakfast.    Dispense:  30 tablet    Refill:  1  . furosemide (LASIX) 40 MG tablet    Sig: Take 1 tablet (40 mg total) by mouth 2 (two) times daily.     Dispense:  60 tablet    Refill:  1    Orders Placed This Encounter  Procedures  . Urine Culture  . POCT glycosylated hemoglobin (Hb A1C)  . POCT urinalysis dipstick  . POCT glucose (manual entry)  . EKG 12-Lead    Referral Orders  No referral(s) requested today   Kathe Becton,  MSN, FNP-BC Patient [place], [place] [phone]   Meds ordered this encounter  Medications  . glimepiride (AMARYL) 4 MG tablet    Sig: Take 1 tablet (4 mg total) by mouth daily with breakfast.    Dispense:  30 tablet    Refill:  1  . furosemide (LASIX) 40 MG tablet    Sig: Take 1 tablet (40 mg total) by mouth 2 (two) times daily.    Dispense:  60 tablet    Refill:  1   Orders Placed This Encounter  Procedures  . Urine Culture  . POCT glycosylated hemoglobin (Hb A1C)  . POCT urinalysis dipstick  . POCT glucose (manual entry)  . EKG 12-Lead    Referral Orders  No referral(s) requested today    Kathe Becton,  MSN, FNP-BC Patient [place], [place] [phone]  Problem List Items Addressed This Visit      Cardiovascular and Mediastinum   Essential hypertension   Relevant Medications   furosemide (LASIX) 40 MG tablet   Hypertensive emergency - Primary   Relevant Medications   furosemide (LASIX) 40 MG tablet   Other Relevant Orders   EKG 12-Lead     Endocrine   Type 2 diabetes mellitus without complication, with long-term current use of insulin (HCC)   Relevant Medications   glimepiride (AMARYL) 4 MG tablet   Other Relevant Orders   POCT glycosylated hemoglobin (Hb A1C) (Completed)   POCT urinalysis dipstick (Completed)   POCT glucose (manual entry) (Completed)     Other   Edema, peripheral   Visual problems    Other Visit Diagnoses    Elevated blood pressure reading       Relevant Orders   EKG 12-Lead   Glaucoma of left eye,  unspecified glaucoma type       Diabetic retinopathy of both eyes with macular edema associated with type 2 diabetes mellitus, unspecified retinopathy severity (HCC)       Relevant Medications   glimepiride (AMARYL) 4 MG tablet   Cataract of both eyes, unspecified cataract type  Intractable headache, unspecified chronicity pattern, unspecified headache type       Dizziness       Relevant Orders   EKG 12-Lead   Abnormal urinalysis       Relevant Orders   Urine Culture   Follow up          Meds ordered this encounter  Medications  . glimepiride (AMARYL) 4 MG tablet    Sig: Take 1 tablet (4 mg total) by mouth daily with breakfast.    Dispense:  30 tablet    Refill:  1  . furosemide (LASIX) 40 MG tablet    Sig: Take 1 tablet (40 mg total) by mouth 2 (two) times daily.    Dispense:  60 tablet    Refill:  1    Follow-up: Return in about 1 month (around 02/22/2019).    Azzie Glatter, FNP

## 2019-01-23 NOTE — Patient Instructions (Signed)
Glimepiride tablets What is this medicine? GLIMEPIRIDE (GLYE me pye ride) helps to treat type 2 diabetes. Treatment is combined with diet and exercise. This medicine helps your body use insulin better. This medicine may be used for other purposes; ask your health care provider or pharmacist if you have questions. COMMON BRAND NAME(S): Amaryl What should I tell my health care provider before I take this medicine? They need to know if you have any of these conditions: -diabetic ketoacidosis -glucose-6-phosphate dehydrogenase deficiency -heart disease -kidney disease -liver disease -severe infection or injury -thyroid disease -an unusual or allergic reaction to glimepiride, sulfa drugs, other medicines, foods, dyes, or preservatives -pregnancy or recent attempts to get pregnant -breast-feeding How should I use this medicine? Take this medicine by mouth. Swallow with a drink of water. Follow the directions on the prescription label. Take your dose at the same time each day, with breakfast or your first large meal. Do not take more often than directed. Talk to your pediatrician regarding the use of this medicine in children. Special care may be needed. Elderly patients over 7 years old can have a stronger reaction and need a smaller dose. Overdosage: If you think you have taken too much of this medicine contact a poison control center or emergency room at once. NOTE: This medicine is only for you. Do not share this medicine with others. What if I miss a dose? If you miss a dose, take it as soon as you can. If it is almost time for your next dose, take only that dose. Do not take double or extra doses. What may interact with this medicine? -bosentan -chloramphenicol -cisapride -clarithromycin -medicines for fungal or yeast infections -metoclopramide -probenecid -warfarin Many medications may cause an increase or decrease in blood sugar, these include: -alcohol containing  beverages -aspirin and aspirin-like drugs -chloramphenicol -chromium -female hormones, like estrogens or progestins and birth control pills -fluoxetine -heart medicines like disopyramide -isoniazid -female hormones or anabolic steroids -medicines called MAO Inhibitors like Nardil, Parnate, Marplan, Eldepryl -medicines for allergies, asthma, cold, or cough -medicines for mental problems -medicines for weight loss -niacin -NSAIDs, medicines for pain and inflammation, like ibuprofen or naproxen -pentamidine -phenytoin -probenecid -quinolone antibiotics like ciprofloxacin, levofloxacin, ofloxacin -some herbal dietary supplements -steroid medicines like prednisone or cortisone -thyroid medicine -water pills or diuretics This list may not describe all possible interactions. Give your health care provider a list of all the medicines, herbs, non-prescription drugs, or dietary supplements you use. Also tell them if you smoke, drink alcohol, or use illegal drugs. Some items may interact with your medicine. What should I watch for while using this medicine? Visit your doctor or health care professional for regular checks on your progress. A test called the HbA1C (A1C) will be monitored. This is a simple blood test. It measures your blood sugar control over the last 2 to 3 months. You will receive this test every 3 to 6 months. Learn how to check your blood sugar. Learn the symptoms of low and high blood sugar and how to manage them. Always carry a quick-source of sugar with you in case you have symptoms of low blood sugar. Examples include hard sugar candy or glucose tablets. Make sure others know that you can choke if you eat or drink when you develop serious symptoms of low blood sugar, such as seizures or unconsciousness. They must get medical help at once. Tell your doctor or health care professional if you have high blood sugar. You might need to change  the dose of your medicine. If you are sick  or exercising more than usual, you might need to change the dose of your medicine. Do not skip meals. Ask your doctor or health care professional if you should avoid alcohol. Many nonprescription cough and cold products contain sugar or alcohol. These can affect blood sugar. This medicine can make you more sensitive to the sun. Keep out of the sun. If you cannot avoid being in the sun, wear protective clothing and use sunscreen. Do not use sun lamps or tanning beds/booths. Wear a medical ID bracelet or chain, and carry a card that describes your disease and details of your medicine and dosage times. What side effects may I notice from receiving this medicine? Side effects that you should report to your doctor or health care professional as soon as possible: -allergic reactions like skin rash, itching or hives, swelling of the face, lips, or tongue -breathing problems -dark urine -fever, chills, sore throat -signs and symptoms of low blood sugar such as feeling anxious, confusion, dizziness, increased hunger, unusually weak or tired, sweating, shakiness, cold, irritable, headache, blurred vision, fast heartbeat, loss of consciousness -unusual bleeding or bruising -yellowing of the eyes or skin Side effects that usually do not require medical attention (report to your doctor or health care professional if they continue or are bothersome): -diarrhea -dizziness -headache -heartburn -nausea -stomach gas This list may not describe all possible side effects. Call your doctor for medical advice about side effects. You may report side effects to FDA at 1-800-FDA-1088. Where should I keep my medicine? Keep out of the reach of children. Store at room temperature below 30 degrees C (86 degrees F). Throw away any unused medicine after the expiration date. NOTE: This sheet is a summary. It may not cover all possible information. If you have questions about this medicine, talk to your doctor, pharmacist, or  health care provider.  2019 [employer]vier/[employer] Standard (2012-11-09 14:29:47)

## 2019-01-23 NOTE — ED Triage Notes (Signed)
Pt here from MD office with c/o dizziness and htn and swollen feet , pt recently had eye surgery and had to be off her lasix for a while

## 2019-01-23 NOTE — ED Provider Notes (Signed)
[facility] Emergency Department Provider Note MRN:  [id number]  Arrival date & time: 01/23/19     Chief Complaint   Hypertension and Dizziness   History of Present Illness   Amber Young is a 63 y.o. year-old female with a history of A. fib, CHF, CKD, diabetes presenting to the ED with chief complaint of hypertension and dizziness.  2 to 3 days of increased edema to the legs, intermittent dizziness described as a lightheadedness, patient endorsing intermittent shortness of breath that she attributes to her A. fib, last occurring this morning.  Denies chest pain.  Denies headache or vision change, no abdominal pain.  Endorses compliance with her blood pressure and Lasix medications, PCP increased her Lasix to twice a day earlier today.  Patient does endorse a saltier diet recently.  Had to stop her Lasix for a eye surgery a couple weeks ago.  Review of Systems  A complete 10 system review of systems was obtained and all systems are negative except as noted in the HPI and PMH.   Patient's Health History    Past Medical History:  Diagnosis Date  . Anemia   . CHF (congestive heart failure) ([place])   . CKD (chronic kidney disease), stage III ([place])   . Coronary artery disease   . GERD (gastroesophageal reflux disease)   . Heart murmur   . Hyperlipidemia   . Hypertension   . Proliferative diabetic retinopathy ([place])    left eye with vitreous hemorrhage and tractional retinal detachment  . Stroke ([place]) 09/2014   numbness left upper lip, finger tips on left hand; "resolved" (05/18/2016)  . Type II diabetes mellitus ([place])   . Wears glasses     Past Surgical History:  Procedure Laterality Date  . CARDIAC CATHETERIZATION N/A 05/18/2016   Procedure: Left Heart Cath and Coronary Angiography;  Surgeon: Jettie Booze, MD;  Location: [place] [facility];  Service: Cardiovascular;  Laterality: N/A;  . CARDIAC CATHETERIZATION N/A 05/18/2016   Procedure: Coronary Stent  Intervention;  Surgeon: Jettie Booze, MD;  Location: [place] [facility];  Service: Cardiovascular;  Laterality: N/A;  . COLONOSCOPY W/ BIOPSIES AND POLYPECTOMY    . CORONARY ANGIOPLASTY    . CORONARY ANGIOPLASTY WITH STENT PLACEMENT    . CORONARY STENT INTERVENTION N/A 09/07/2017   Procedure: CORONARY STENT INTERVENTION;  Surgeon: Leonie Man, MD;  Location: [place] [facility];  Service: Cardiovascular;  Laterality: N/A;  . EYE SURGERY    . GAS INSERTION Left 01/13/2018   Procedure: INSERTION OF GAS;  Surgeon: Bernarda Caffey, MD;  Location: [place];  Service: Ophthalmology;  Laterality: Left;  . LEFT HEART CATH AND CORONARY ANGIOGRAPHY N/A 09/07/2017   Procedure: LEFT HEART CATH AND CORONARY ANGIOGRAPHY;  Surgeon: Leonie Man, MD;  Location: [place] [facility];  Service: Cardiovascular;  Laterality: N/A;  . MEMBRANE PEEL Left 01/13/2018   Procedure: MEMBRANE PEEL LEFT EYE ;  Surgeon: Bernarda Caffey, MD;  Location: [place];  Service: Ophthalmology;  Laterality: Left;  Marland Kitchen MULTIPLE TOOTH EXTRACTIONS    . PARS PLANA VITRECTOMY Left 01/13/2018   Procedure: PARS PLANA VITRECTOMY WITH 25 GAUGE LEFT EYE WITH ENDOLASER;  Surgeon: Bernarda Caffey, MD;  Location: [place];  Service: Ophthalmology;  Laterality: Left;  . TUBAL LIGATION  1980  . VAGINAL HYSTERECTOMY  1999   "partial; fibroids"    Family History  Problem Relation Age of Onset  . Diabetes Mother   . Hypertension Mother   .  COPD Mother   . Heart failure Mother   . Hypertension Sister   . Hypertension Brother     Social History   Socioeconomic History  . Marital status: Married    Spouse name: Not on file  . Number of children: Not on file  . Years of education: 49  . Highest education level: Not on file  Occupational History  . Occupation: hairdresser  Social Needs  . Financial resource strain: Not on file  . Food insecurity    Worry: Not on file    Inability: Not on file  . Transportation needs    Medical: Not on file     Non-medical: Not on file  Tobacco Use  . Smoking status: Never Smoker  . Smokeless tobacco: Never Used  Substance and Sexual Activity  . Alcohol use: No    Alcohol/week: 0.0 standard drinks  . Drug use: No  . Sexual activity: Yes  Lifestyle  . Physical activity    Days per week: Not on file    Minutes per session: Not on file  . Stress: Not on file  Relationships  . Social Herbalist on phone: Not on file    Gets together: Not on file    Attends religious service: Not on file    Active member of club or organization: Not on file    Attends meetings of clubs or organizations: Not on file    Relationship status: Not on file  . Intimate partner violence    Fear of current or ex partner: Not on file    Emotionally abused: Not on file    Physically abused: Not on file    Forced sexual activity: Not on file  Other Topics Concern  . Not on file  Social History Narrative   Married, 2 children, hairdresser   Caffeine use- occasional tea or coffee   Right handed     Physical Exam  Vital Signs and Nursing Notes reviewed Vitals:   01/23/19 1245 01/23/19 1300  BP: (!) 197/108 (!) 187/106  Pulse: 70 60  Resp: 13 10  Temp:    SpO2: 99% 100%    CONSTITUTIONAL: Well-appearing, NAD NEURO:  Alert and oriented x 3, no focal deficits EYES:  eyes equal and reactive ENT/NECK:  no LAD, no JVD CARDIO: Regular rate, well-perfused, normal S1 and S2 PULM:  CTAB no wheezing or rhonchi GI/GU:  normal bowel sounds, non-distended, non-tender MSK/SPINE:  No gross deformities, 2+ pitting edema to bilateral lower extremities SKIN:  no rash, atraumatic PSYCH:  Appropriate speech and behavior  Diagnostic and Interventional Summary    EKG Interpretation  Date/Time:  Monday January 23 2019 12:02:27 EDT Ventricular Rate:  93 PR Interval:    QRS Duration: 92 QT Interval:  380 QTC Calculation: 473 R Axis:   26 Text Interpretation:  Sinus rhythm Probable left atrial enlargement LVH  with secondary repolarization abnormality Confirmed by Gerlene Fee [phone]) on 01/23/2019 12:12:00 PM      Labs Reviewed  CBC - Abnormal; Notable for the following components:      Result Value   Hemoglobin 11.1 (*)    HCT 35.8 (*)    MCV 75.1 (*)    MCH 23.3 (*)    All other components within normal limits  BASIC METABOLIC PANEL - Abnormal; Notable for the following components:   Potassium 3.0 (*)    CO2 21 (*)    Glucose, Bld 176 (*)    BUN 26 (*)  Creatinine, Ser 2.58 (*)    Calcium 8.1 (*)    GFR calc non Af Amer 19 (*)    GFR calc Af Amer 22 (*)    All other components within normal limits  TROPONIN I - Abnormal; Notable for the following components:   Troponin I 0.03 (*)    All other components within normal limits  NOVEL CORONAVIRUS, NAA (HOSPITAL ORDER, SEND-OUT TO REF LAB)  PROTIME-INR  BRAIN NATRIURETIC PEPTIDE    DG Chest Port 1 View  Final Result      Medications  potassium chloride SA (K-DUR) CR tablet 40 mEq (has no administration in time range)  labetalol (NORMODYNE) injection 10 mg (10 mg Intravenous Given 01/23/19 1235)  furosemide (LASIX) injection 40 mg (40 mg Intravenous Given 01/23/19 1235)     Procedures Critical Care Critical Care Documentation Critical care time provided by me (excluding procedures): 33 minutes  Condition necessitating critical care: Hypertensive emergency  Components of critical care management: reviewing of prior records, laboratory and imaging interpretation, frequent re-examination and reassessment of vital signs, administration of IV labetalol, IV Lasix, discussion with consulting services.    ED Course and Medical Decision Making  I have reviewed the triage vital signs and the nursing notes.  Pertinent labs & imaging results that were available during my care of the patient were reviewed by me and considered in my medical decision making (see below for details).  Suspect hypertensive urgency causing increased  afterload and CHF exacerbation in this 63 year old female with history of CHF.  Swelling is bilateral, she has no hypoxia, no increased work of breathing, little to no concern for PE at this time.  EKG is showing some repolarization abnormalities new from prior.  Does not meet STEMI criteria but could suggest ischemia.  Awaiting labs.  Patient's blood pressure 212/120, will treat with dose of labetalol, will also dose IV Lasix.  Troponin mildly elevated at 0.03, BNP is still pending palpation has clear evidence of fluid overloaded state on exam.  Labetalol improved blood pressure to 789F systolic, but would still benefit from continued diuresis and blood pressure control in the hospital.  To be admitted to unassigned internal medicine service.  Barth Kirks. [place]onia Small, [place] [email or web address]  Final Clinical Impressions(s) / ED Diagnoses     ICD-10-CM   1. Acute on chronic congestive heart failure, unspecified heart failure type ([place])  I50.9   2. SOB (shortness of breath)  R06.02 DG Chest [facility] 1 View    DG Chest [place] 1 View  3. Hypertensive urgency  I16.0   4. Hypokalemia  E87.6   5. [place]     ED Discharge Orders    None         Maudie Flakes, MD 01/23/19 1358

## 2019-01-23 NOTE — Progress Notes (Signed)
ANTICOAGULATION CONSULT NOTE - Initial Consult  Pharmacy Consult for warfarin Indication: atrial fibrillation  Allergies  Allergen Reactions  . Ace Inhibitors Cough    Patient Measurements: Height: 5\' 6"  (167.6 cm) Weight: 194 lb (88 kg) IBW/kg (Calculated) : 59.3  Vital Signs: Temp: 98 F (36.7 C) (06/15 1201) Temp Source: Oral (06/15 1201) BP: 187/106 (06/15 1300) Pulse Rate: 60 (06/15 1300)  Labs: Recent Labs    01/23/19 1248  HGB 11.1*  HCT 35.8*  PLT 260  LABPROT 13.8  INR 1.1  CREATININE 2.58*  TROPONINI 0.03*    Estimated Creatinine Clearance: 25.3 mL/min (A) (by C-G formula based on SCr of 2.58 mg/dL (H)).   Medical History: Past Medical History:  Diagnosis Date  . Anemia   . CHF (congestive heart failure) ([place])   . CKD (chronic kidney disease), stage III ([place])   . Coronary artery disease   . GERD (gastroesophageal reflux disease)   . Heart murmur   . Hyperlipidemia   . Hypertension   . 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 Stroke ([place]) 09/2014   numbness left upper lip, finger tips on left hand; "resolved" (05/18/2016)  . Type II diabetes mellitus ([place])   . Wears glasses     Assessment: 44 yof presented to the ED with hypertension and dizziness. She is on chronic warfarin for history of afib. She also had recent cataract surgery. INR is subtherapeutic. Hgb is slightly low and platelets are WNL.   Goal of Therapy:  INR 2-3 Monitor platelets by anticoagulation protocol: Yes   Plan:  Warfarin 7.5mg  PO x 1 tonight Daily INR  Brookelle Pellicane, Rande Lawman 01/23/2019,2:37 PM

## 2019-01-23 NOTE — H&P (Signed)
History and Physical    Nielle Duford CHE:[id number] DOB: [birth year] DOA: 01/23/2019  Referring MD/NP/PA: Sedonia Small PCP: Azzie Glatter, FNP  Outpatient Specialists: Jacinta Shoe Patient coming from: home  Chief Complaint: LE edema   HPI: Amber Young is a delightful 63 y.o. female with medical history significant of afib, CHF, CKD, diabetes presents to ED with cc LE edema, htn. Initial workup reveals acute on chronic heart failure, hypertensive urgency. [facility] asked [facility] admit.  Information is obtained from the patient.  She states over the last 3 days her legs have gradually become more more swollen.  Associated symptoms include intermittent dizziness mostly with position change.  She reports compliance with her medications but did stop her Lasix for a couple of days last week due to eye surgery.  She also admits to dietary indiscretions.  She denies any recent medication changes except for her oral diabetes medicine.  She denies pain palpitations shortness of breath.  She denies headache visual changes.  She denies abdominal pain nausea vomiting diarrhea constipation melena bright red blood per rectum.  She denies dysuria hematuria frequency or urgency.  She states she has taken "some" of her home medications this morning but cannot remember which ones.  She did see her primary care provider today and her Lasix was increased to twice daily.  ED Course: In the emergency department she is afebrile with a blood pressure of 187/106 and this is after 10 mg of labetalol IV.  She is also provided with 40 mg Lasix IV.  Complete metabolic panel significant for potassium of 3.0, CO2 21 serum glucose 176, BUN 26, creatinine 2.58, initial troponin 0 0.03, hemoglobin 11.1 MCV 76.  Chest x-ray with cardiomegaly and pulmonary venous hypertension no frank edema or effusion.  EKG with sinus rhythm and LVH.  Review of Systems: As per HPI otherwise 10 point review of systems negative.    Past Medical  History:  Diagnosis Date  . Anemia   . CHF (congestive heart failure) ([place])   . CKD (chronic kidney disease), stage III ([place])   . Coronary artery disease   . GERD (gastroesophageal reflux disease)   . Heart murmur   . Hyperlipidemia   . Hypertension   . Proliferative diabetic retinopathy ([place])    left eye with vitreous hemorrhage and tractional retinal detachment  . Stroke ([place]) 09/2014   numbness left upper lip, finger tips on left hand; "resolved" (05/18/2016)  . Type II diabetes mellitus ([place])   . Wears glasses     Past Surgical History:  Procedure Laterality Date  . CARDIAC CATHETERIZATION N/A 05/18/2016   Procedure: Left Heart Cath and Coronary Angiography;  Surgeon: Jettie Booze, MD;  Location: [place] [facility];  Service: Cardiovascular;  Laterality: N/A;  . CARDIAC CATHETERIZATION N/A 05/18/2016   Procedure: Coronary Stent Intervention;  Surgeon: Jettie Booze, MD;  Location: [facility];  Service: Cardiovascular;  Laterality: N/A;  . COLONOSCOPY W/ BIOPSIES AND POLYPECTOMY    . CORONARY ANGIOPLASTY    . CORONARY ANGIOPLASTY WITH STENT PLACEMENT    . CORONARY STENT INTERVENTION N/A 09/07/2017   Procedure: CORONARY STENT INTERVENTION;  Surgeon: Leonie Man, MD;  Location: [facility] [facility];  Service: Cardiovascular;  Laterality: N/A;  . EYE SURGERY    . GAS INSERTION Left 01/13/2018   Procedure: INSERTION OF GAS;  Surgeon: Bernarda Caffey, MD;  Location: [facility];  Service: Ophthalmology;  Laterality: Left;  . LEFT HEART CATH AND CORONARY ANGIOGRAPHY N/A  09/07/2017   Procedure: LEFT HEART CATH AND CORONARY ANGIOGRAPHY;  Surgeon: Leonie Man, MD;  Location: [place] [facility];  Service: Cardiovascular;  Laterality: N/A;  . MEMBRANE PEEL Left 01/13/2018   Procedure: MEMBRANE PEEL LEFT EYE ;  Surgeon: Bernarda Caffey, MD;  Location: [facility];  Service: Ophthalmology;  Laterality: Left;  Marland Kitchen MULTIPLE TOOTH EXTRACTIONS    . PARS PLANA VITRECTOMY Left 01/13/2018    Procedure: PARS PLANA VITRECTOMY WITH 25 GAUGE LEFT EYE WITH ENDOLASER;  Surgeon: Bernarda Caffey, MD;  Location: [facility];  Service: Ophthalmology;  Laterality: Left;  . TUBAL LIGATION  1980  . VAGINAL HYSTERECTOMY  1999   "partial; fibroids"     reports that she has never smoked. She has never used smokeless tobacco. She reports that she does not drink alcohol or use drugs.  Allergies  Allergen Reactions  . Ace Inhibitors Cough    Family History  Problem Relation Age of Onset  . Diabetes Mother   . Hypertension Mother   . COPD Mother   . Heart failure Mother   . Hypertension Sister   . Hypertension Brother      Prior to Admission medications   Medication Sig Start Date End Date Taking? Authorizing Provider  acetaminophen (TYLENOL) 500 MG tablet Take 1,000 mg by mouth every 6 (six) hours as needed for mild pain or headache.   Yes [provider]  amLODipine (NORVASC) 10 MG tablet Take 1 tablet (10 mg total) by mouth daily. 08/25/18  Yes Azzie Glatter, FNP  atorvastatin (LIPITOR) 40 MG tablet Take 1 tablet (40 mg total) by mouth daily at 6 PM. 04/26/17  Yes Scot Jun, FNP  brimonidine (ALPHAGAN) 0.2 % ophthalmic solution Place 1 drop into the left eye 2 (two) times a day. 11/30/18  Yes Bernarda Caffey, MD  carvedilol (COREG) 25 MG tablet Take 0.5 tablets (12.5 mg total) by mouth 2 (two) times daily. 07/23/18  Yes Isabelle Course, MD  dorzolamide-timolol (COSOPT) 22.3-6.8 MG/ML ophthalmic solution Place 1 drop into the left eye 2 (two) times daily.   Yes [provider]  ferrous sulfate (FERROUSUL) 325 (65 FE) MG tablet Take 1 tablet (325 mg total) by mouth every other day. 11/22/17  Yes Herminio Commons, MD  furosemide (LASIX) 40 MG tablet Take 1 tablet (40 mg total) by mouth 2 (two) times daily. 01/23/19  Yes Azzie Glatter, FNP  glimepiride (AMARYL) 4 MG tablet Take 1 tablet (4 mg total) by mouth daily with breakfast. 01/23/19  Yes Azzie Glatter, FNP   glipiZIDE (GLUCOTROL) 10 MG tablet Take 1 tablet (10 mg total) by mouth 2 (two) times daily before a meal. 11/28/18  Yes Azzie Glatter, FNP  hydrALAZINE (APRESOLINE) 50 MG tablet Take 1.5 tablets (75 mg total) by mouth 3 (three) times daily. 11/22/17  Yes Herminio Commons, MD  hydrochlorothiazide (HYDRODIURIL) 25 MG tablet Take 1 tablet (25 mg total) by mouth daily. 08/25/18  Yes Azzie Glatter, FNP  insulin glargine (LANTUS) 100 UNIT/ML injection Inject 0.2 mLs (20 Units total) into the skin 2 (two) times daily. 03/30/18  Yes Azzie Glatter, FNP  nitroGLYCERIN (NITROSTAT) 0.4 MG SL tablet Place 1 tablet (0.4 mg total) under the tongue every 5 (five) minutes x 3 doses as needed for chest pain. 07/26/18  Yes Azzie Glatter, FNP  potassium chloride (K-DUR,KLOR-CON) 10 MEQ tablet Take 1 tablet with each dose of furosemide, daily. Patient taking differently: Take 10 mEq by  mouth 2 (two) times daily. Take 1 tablet with each dose of furosemide, daily. 10/26/18  Yes Azzie Glatter, FNP  ticagrelor (BRILINTA) 90 MG TABS tablet Take 1 tablet (90 mg total) by mouth 2 (two) times daily. 07/15/17  Yes Tresa Garter, MD  warfarin (COUMADIN) 5 MG tablet Take 1 to 1.5 tablets by mouth daily as directed by coumadin clinic Patient taking differently: Take 5 mg by mouth daily at 6 PM.  08/25/18  Yes Azzie Glatter, FNP  Glucose Blood (BLOOD GLUCOSE TEST STRIPS) STRP Use as directed 02/11/17   Hosie Poisson, MD  Insulin Pen Needle 29G X 10MM MISC Use as directed 09/22/17   Scot Jun, FNP  INSULIN SYRINGE .5CC/29G 29G X 1/2" 0.5 ML MISC Use to administer insulin three times daily 09/22/17   Scot Jun, FNP  TRUEPLUS PEN NEEDLES 31G X 6 MM MISC USE AS DIRECTED 06/02/18   Azzie Glatter, FNP    Physical Exam: Vitals:   01/23/19 1152 01/23/19 1201 01/23/19 1245 01/23/19 1300  BP:  (!) 212/121 (!) 197/108 (!) 187/106  Pulse:  95 70 60  Resp:  20 13 10   Temp:  98 F (36.7 C)     TempSrc:  Oral    SpO2:  100% 99% 100%  Weight: 88 kg     Height: 5\' 6"  (1.676 m)         Constitutional: NAD, calm, comfortable Vitals:   01/23/19 1152 01/23/19 1201 01/23/19 1245 01/23/19 1300  BP:  (!) 212/121 (!) 197/108 (!) 187/106  Pulse:  95 70 60  Resp:  20 13 10   Temp:  98 F (36.7 C)    TempSrc:  Oral    SpO2:  100% 99% 100%  Weight: 88 kg     Height: 5\' 6"  (1.676 m)      Eyes: PERRL, lids and conjunctivae normal ENMT: Mucous membranes are moist. Posterior pharynx clear of any exudate or lesions.Normal dentition.  Neck: normal, supple, no masses, no thyromegaly Respiratory: clear to auscultation bilaterally, no wheezing, no crackles. Normal respiratory effort. No accessory muscle use.  Cardiovascular: Irregularly irregular no murmur gallop or rub 2+ lower extremity pitting edema bilaterally Abdomen: no tenderness, no masses palpated. No hepatosplenomegaly. Bowel sounds positive.  Musculoskeletal: no clubbing / cyanosis. No joint deformity upper and lower extremities. Good ROM, no contractures. Normal muscle tone.  Skin: no rashes, lesions, ulcers. No induration Neurologic: CN 2-12 grossly intact. Sensation intact, DTR normal. Strength 5/5 in all 4.  Psychiatric: Normal judgment and insight. Alert and oriented x 3. Normal mood.   Labs on Admission: I have personally reviewed following labs and imaging studies  CBC: Recent Labs  Lab 01/23/19 1248  WBC 4.4  HGB 11.1*  HCT 35.8*  MCV 75.1*  PLT 841   Basic Metabolic Panel: Recent Labs  Lab 01/23/19 1248  NA 138  K 3.0*  CL 106  CO2 21*  GLUCOSE 176*  BUN 26*  CREATININE 2.58*  CALCIUM 8.1*   GFR: Estimated Creatinine Clearance: 25.3 mL/min (A) (by C-G formula based on SCr of 2.58 mg/dL (H)). Liver Function Tests: No results for input(s): AST, ALT, ALKPHOS, BILITOT, PROT, ALBUMIN in the last 168 hours. No results for input(s): LIPASE, AMYLASE in the last 168 hours. No results for input(s): AMMONIA  in the last 168 hours. Coagulation Profile: Recent Labs  Lab 01/23/19 1248  INR 1.1   Cardiac Enzymes: Recent Labs  Lab 01/23/19 1248  TROPONINI 0.03*  BNP (last 3 results) No results for input(s): PROBNP in the last 8760 hours. HbA1C: Recent Labs    01/23/19 1027  HGBA1C 10.6*   CBG: No results for input(s): GLUCAP in the last 168 hours. Lipid Profile: No results for input(s): CHOL, HDL, LDLCALC, TRIG, CHOLHDL, LDLDIRECT in the last 72 hours. Thyroid Function Tests: No results for input(s): TSH, T4TOTAL, FREET4, T3FREE, THYROIDAB in the last 72 hours. Anemia Panel: No results for input(s): VITAMINB12, FOLATE, FERRITIN, TIBC, IRON, RETICCTPCT in the last 72 hours. Urine analysis:    Component Value Date/Time   COLORURINE YELLOW 07/21/2018 1303   APPEARANCEUR HAZY (A) 07/21/2018 1303   LABSPEC 1.013 07/21/2018 1303   PHURINE 6.0 07/21/2018 1303   GLUCOSEU 150 (A) 07/21/2018 1303   HGBUR SMALL (A) 07/21/2018 1303   BILIRUBINUR negative 01/23/2019 1034   BILIRUBINUR neg 12/15/2017 0929   KETONESUR negative 01/23/2019 [place] 07/21/2018 1303   PROTEINUR >=300 (A) 01/23/2019 1034   PROTEINUR >=300 (A) 07/21/2018 1303   UROBILINOGEN 0.2 01/23/2019 1034   UROBILINOGEN 0.2 10/25/2017 0910   NITRITE Negative 01/23/2019 1034   NITRITE NEGATIVE 07/21/2018 1303   LEUKOCYTESUR Negative 01/23/2019 1034   Sepsis Labs: @LABRCNTIP (procalcitonin:4,lacticidven:4) )No results found for this or any previous visit (from the past 240 hour(s)).   Radiological Exams on Admission: Dg Chest Port 1 View  Result Date: 01/23/2019 CLINICAL DATA:  Congestive heart failure. Swelling of the lower extremities. EXAM: PORTABLE CHEST 1 VIEW COMPARISON:  07/20/2018 FINDINGS: There is cardiomegaly. There is pulmonary venous hypertension without frank edema. No effusions. No acute bone finding. Old healed left proximal humeral fracture. IMPRESSION: Cardiomegaly and pulmonary venous  hypertension. No frank edema or effusion. Electronically Signed   By: Nelson Chimes M.D.   On: 01/23/2019 13:32    EKG: Independently reviewed.noted above  Assessment/Plan Principal Problem:   Acute on chronic combined systolic (congestive) and diastolic (congestive) heart failure (HCC) Active Problems:   Hypertensive urgency   Uncontrolled diabetes mellitus (HCC)   PAF (paroxysmal atrial fibrillation) (HCC)   CKD (chronic kidney disease), stage III ([place])   Essential hypertension   Acute on chronic heart failure ([place])   #1.  Acute on chronic combined systolic and diastolic heart failure.  Likely multifactorial specifically missing doses of Lasix dietary indiscretion uncontrolled blood pressure.  Echo in December 2019 reveals an EF of 40% severe anteroseptal apical akinesis.  Home medications include Lasix, Coreg, HCTZ.  -monitor on tele -continue Lasix 40 mg IV -Repeat echo -Cardiology consult -Daily weights -Intake and output  #2.  Hypertensive urgency.  Patient reports a long history of very difficult to control blood pressure.  Home medications include hydralazine, Lasix, Coreg, Apresoline, hydrochlorothiazide, Norvasc.  She received 10 mg of labetalol IV in the emergency department along with 40 mg of Lasix IV.  At the time of admission blood pressure is 197/121. -Resume home meds -Continue IV Lasix as noted above -PRN hydralazine -Monitor closely  3.  Paroxysmal A. fib.  Home medications include coreg and Brilinta and coumadin. chadvasc score 4.  -continue home meds  #4.  Hypokalemia.  Likely related to 6.  Potassium level 3.0 on admission -Replete -Check magnesium level -Recheck in the morning -Continue home potassium supplement  #5.  Diabetes.  Home medications include Lantus and oral agents.  Urine glucose 175 on admission. -Continue home Lantus -Oral agents as appetite unreliable -Sliding scale insulin for optimal control  #6.  Chronic kidney disease.  Creatinine  2.58.  Chart  review indicates this is close to baseline.  Of note creatinine 1.879 months ago -Lasix as noted above -Monitor urine output -Recheck   DVT prophylaxis: lovenox Code Status: full Family Communication: patient Disposition Plan: home when ready Consults called:  mission status: obs   [place] NP [facility]   If 7PM-7AM, please contact night-coverage [email or web address] Password [facility]  01/23/2019, 2:48 PM

## 2019-01-23 NOTE — ED Notes (Signed)
ED TO INPATIENT HANDOFF REPORT  ED Nurse Name and Phone #:  Darrold Span [phone]  S Name/Age/Gender Amber Young 63 y.o. female Room/Bed: 056C/056C  Code Status   Code Status: Full Code  Home/SNF/Other Home Patient oriented to: self, place, time and situation Is this baseline? Yes   Triage Complete: Triage complete  Chief Complaint CHF  Triage Note Pt here from MD office with c/o dizziness and htn and swollen feet , pt recently had eye surgery and had to be off her lasix for a while    Allergies Allergies  Allergen Reactions  . Ace Inhibitors Cough    Level of Care/Admitting Diagnosis ED Disposition    ED Disposition Condition Comment   Admit  Hospital Area: [facility] [[id number]]  Level of Care: Telemetry Cardiac [103]  I expect the patient will be discharged within 24 hours: No (not a candidate for 5C-Observation unit)  Covid Evaluation: Confirmed COVID Negative  Diagnosis: Acute on chronic heart failure [facility]) [[id number]]  Admitting Physician: Karmen Bongo [[id number]]  Attending Physician: Karmen Bongo [[id number]]  PT Class (Do Not Modify): Observation [104]  PT Acc Code (Do Not Modify): Observation [10022]       B Medical/Surgery History Past Medical History:  Diagnosis Date  . Anemia   . CHF (congestive heart failure) ([place])   . CKD (chronic kidney disease), stage III ([place])   . Coronary artery disease   . GERD (gastroesophageal reflux disease)   . Heart murmur   . Hyperlipidemia   . Hypertension   . 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 Stroke ([place]) 09/2014   numbness left upper lip, finger tips on left hand; "resolved" (05/18/2016)  . Type II diabetes mellitus ([place])   . Wears glasses    Past Surgical History:  Procedure Laterality Date  . CARDIAC CATHETERIZATION N/A 05/18/2016   Procedure: Left Heart Cath and Coronary Angiography;  Surgeon: Jettie Booze, MD;  Location: [facility];  Service: Cardiovascular;  Laterality: N/A;  . CARDIAC CATHETERIZATION N/A 05/18/2016   Procedure: Coronary Stent Intervention;  Surgeon: Jettie Booze, MD;  Location: [facility] [facility];  Service: Cardiovascular;  Laterality: N/A;  . COLONOSCOPY W/ BIOPSIES AND POLYPECTOMY    . CORONARY ANGIOPLASTY    . CORONARY ANGIOPLASTY WITH STENT PLACEMENT    . CORONARY STENT INTERVENTION N/A 09/07/2017   Procedure: CORONARY STENT INTERVENTION;  Surgeon: Leonie Man, MD;  Location: [facility];  Service: Cardiovascular;  Laterality: N/A;  . EYE SURGERY    . GAS INSERTION Left 01/13/2018   Procedure: INSERTION OF GAS;  Surgeon: Bernarda Caffey, MD;  Location: [facility];  Service: Ophthalmology;  Laterality: Left;  . LEFT HEART CATH AND CORONARY ANGIOGRAPHY N/A 09/07/2017   Procedure: LEFT HEART CATH AND CORONARY ANGIOGRAPHY;  Surgeon: Leonie Man, MD;  Location: [place] [facility];  Service: Cardiovascular;  Laterality: N/A;  . MEMBRANE PEEL Left 01/13/2018   Procedure: MEMBRANE PEEL LEFT EYE ;  Surgeon: Bernarda Caffey, MD;  Location: [place] [facility];  Service: Ophthalmology;  Laterality: Left;  Marland Kitchen MULTIPLE TOOTH EXTRACTIONS    . PARS PLANA VITRECTOMY Left 01/13/2018   Procedure: PARS PLANA VITRECTOMY WITH 25 GAUGE LEFT EYE WITH ENDOLASER;  Surgeon: Bernarda Caffey, MD;  Location: [facility];  Service: Ophthalmology;  Laterality: Left;  . TUBAL LIGATION  1980  . VAGINAL HYSTERECTOMY  1999   "partial; fibroids"  A IV Location/Drains/Wounds Patient Lines/Drains/Airways Status   Active Line/Drains/Airways    Name:   Placement date:   Placement time:   Site:   Days:   Peripheral IV 01/23/19 Left Antecubital   01/23/19    1154    Antecubital   less than 1          Intake/Output Last 24 hours No intake or output data in the 24 hours ending 01/23/19 1559  Labs/Imaging Results for orders placed or performed during the hospital encounter of 01/23/19 (from the  past 48 hour(s))  CBC     Status: Abnormal   Collection Time: 01/23/19 12:48 PM  Result Value Ref Range   WBC 4.4 4.0 - 10.5 K/uL   RBC 4.77 3.87 - 5.11 MIL/uL   Hemoglobin 11.1 (L) 12.0 - 15.0 g/dL   HCT 35.8 (L) 36.0 - 46.0 %   MCV 75.1 (L) 80.0 - 100.0 fL   MCH 23.3 (L) 26.0 - 34.0 pg   MCHC 31.0 30.0 - 36.0 g/dL   RDW 15.4 11.5 - 15.5 %   Platelets 260 150 - 400 K/uL   nRBC 0.0 0.0 - 0.2 %    Comment: Performed at [facility], [address] [place], [place] [address]  Basic metabolic panel     Status: Abnormal   Collection Time: 01/23/19 12:48 PM  Result Value Ref Range   Sodium 138 135 - 145 mmol/L   Potassium 3.0 (L) 3.5 - 5.1 mmol/L   Chloride 106 98 - 111 mmol/L   CO2 21 (L) 22 - 32 mmol/L   Glucose, Bld 176 (H) 70 - 99 mg/dL   BUN 26 (H) 8 - 23 mg/dL   Creatinine, Ser 2.58 (H) 0.44 - 1.00 mg/dL   Calcium 8.1 (L) 8.9 - 10.3 mg/dL   GFR calc non Af Amer 19 (L) >60 mL/min   GFR calc Af Amer 22 (L) >60 mL/min   Anion gap 11 5 - 15    Comment: Performed at [facility] [address], [place], [place] [address]  Troponin I - ONCE - STAT     Status: Abnormal   Collection Time: 01/23/19 12:48 PM  Result Value Ref Range   Troponin I 0.03 (HH) <0.03 ng/mL    Comment: CRITICAL RESULT CALLED TO, READ BACK BY AND VERIFIED WITH: GROSE,C RN @ [id number] 01/23/19 LEONARD,A Performed at [facility], [place] [address] [place], [place] [address]   Protime-INR     Status: None   Collection Time: 01/23/19 12:48 PM  Result Value Ref Range   Prothrombin Time 13.8 11.4 - 15.2 seconds   INR 1.1 0.8 - 1.2    Comment: (NOTE) INR goal varies based on device and disease states. Performed at [facility], [place] [address], [place], [place] [address]    Dg Chest Port 1 View  Result Date: 01/23/2019 CLINICAL DATA:  Congestive heart failure. Swelling of the lower extremities. EXAM: PORTABLE CHEST 1 VIEW COMPARISON:  07/20/2018 FINDINGS: There is cardiomegaly. There is  pulmonary venous hypertension without frank edema. No effusions. No acute bone finding. Old healed left proximal humeral fracture. IMPRESSION: Cardiomegaly and pulmonary venous hypertension. No frank edema or effusion. Electronically Signed   By: Nelson Chimes M.D.   On: 01/23/2019 13:32    Pending Labs Unresulted Labs (From admission, onward)    Start     Ordered   01/24/19 9357  Basic metabolic panel  Daily,   R  01/23/19 1430   01/24/19 0500  Protime-INR  Daily,   R     01/23/19 1447   01/23/19 1429  Hemoglobin A1c  Add-on,   AD    Comments: To assess prior glycemic control    01/23/19 1430   01/23/19 1425  Magnesium  Add-on,   AD     01/23/19 1430   01/23/19 1356  Novel Coronavirus,NAA,(SEND-OUT TO REF LAB - TAT 24-48 hrs); Hosp Order  (Asymptomatic Patients Labs)  Once,   STAT    Question:  Rule Out  Answer:  Yes   01/23/19 1355   01/23/19 1355  Brain natriuretic peptide  Add-on,   AD     01/23/19 1355          Vitals/Pain Today's Vitals   01/23/19 1245 01/23/19 1300 01/23/19 1501 01/23/19 1515  BP: (!) 197/108 (!) 187/106 (!) 197/121 (!) 201/116  Pulse: 70 60  88  Resp: 13 10  20   Temp:      TempSrc:      SpO2: 99% 100%  99%  Weight:      Height:      PainSc:        Isolation Precautions No active isolations  Medications Medications  amLODipine (NORVASC) tablet 10 mg (10 mg Oral Given 01/23/19 1501)  atorvastatin (LIPITOR) tablet 40 mg (has no administration in time range)  carvedilol (COREG) tablet 12.5 mg (has no administration in time range)  hydrALAZINE (APRESOLINE) tablet 75 mg (75 mg Oral Given 01/23/19 1502)  nitroGLYCERIN (NITROSTAT) SL tablet 0.4 mg (has no administration in time range)  ferrous sulfate tablet 325 mg (has no administration in time range)  ticagrelor (BRILINTA) tablet 90 mg (has no administration in time range)  potassium chloride (K-DUR) CR tablet 10 mEq (has no administration in time range)  brimonidine (ALPHAGAN) 0.2 % ophthalmic  solution 1 drop (has no administration in time range)  dorzolamide-timolol (COSOPT) 22.3-6.8 MG/ML ophthalmic solution 1 drop (has no administration in time range)  sodium chloride flush (NS) 0.9 % injection 3 mL (has no administration in time range)  sodium chloride flush (NS) 0.9 % injection 3 mL (has no administration in time range)  0.9 %  sodium chloride infusion (has no administration in time range)  acetaminophen (TYLENOL) tablet 650 mg (has no administration in time range)  ondansetron (ZOFRAN) injection 4 mg (has no administration in time range)  furosemide (LASIX) injection 40 mg (has no administration in time range)  ALPRAZolam (XANAX) tablet 0.25 mg (has no administration in time range)  insulin aspart (novoLOG) injection 0-15 Units (has no administration in time range)  insulin aspart (novoLOG) injection 0-5 Units (has no administration in time range)  insulin glargine (LANTUS) injection 20 Units (has no administration in time range)  warfarin (COUMADIN) tablet 7.5 mg (has no administration in time range)  Warfarin - Pharmacist Dosing Inpatient (has no administration in time range)  hydrALAZINE (APRESOLINE) injection 10 mg (has no administration in time range)  labetalol (NORMODYNE) injection 10 mg (10 mg Intravenous Given 01/23/19 1235)  furosemide (LASIX) injection 40 mg (40 mg Intravenous Given 01/23/19 1235)  potassium chloride SA (K-DUR) CR tablet 40 mEq (40 mEq Oral Given 01/23/19 1436)    Mobility walks Low fall risk   Focused Assessments Cardiac Assessment Handoff:  Cardiac Rhythm: Normal sinus rhythm Lab Results  Component Value Date   TROPONINI 0.03 (HH) 01/23/2019   Lab Results  Component Value Date   DDIMER 1.22 (H) 11/14/2015   Does the  Patient currently have chest pain? No     R Recommendations: See Admitting Provider Note  Report given to:   Additional Notes:

## 2019-01-24 ENCOUNTER — Observation Stay (HOSPITAL_COMMUNITY): Payer: Medicare Other

## 2019-01-24 ENCOUNTER — Encounter (HOSPITAL_COMMUNITY): Payer: Self-pay | Admitting: Physician Assistant

## 2019-01-24 DIAGNOSIS — E785 Hyperlipidemia, unspecified: Secondary | ICD-10-CM | POA: Diagnosis present

## 2019-01-24 DIAGNOSIS — I130 Hypertensive heart and chronic kidney disease with heart failure and stage 1 through stage 4 chronic kidney disease, or unspecified chronic kidney disease: Secondary | ICD-10-CM | POA: Diagnosis present

## 2019-01-24 DIAGNOSIS — I25118 Atherosclerotic heart disease of native coronary artery with other forms of angina pectoris: Secondary | ICD-10-CM | POA: Diagnosis not present

## 2019-01-24 DIAGNOSIS — Z955 Presence of coronary angioplasty implant and graft: Secondary | ICD-10-CM | POA: Diagnosis not present

## 2019-01-24 DIAGNOSIS — K219 Gastro-esophageal reflux disease without esophagitis: Secondary | ICD-10-CM | POA: Diagnosis present

## 2019-01-24 DIAGNOSIS — Z8673 Personal history of transient ischemic attack (TIA), and cerebral infarction without residual deficits: Secondary | ICD-10-CM | POA: Diagnosis not present

## 2019-01-24 DIAGNOSIS — I161 Hypertensive emergency: Secondary | ICD-10-CM | POA: Diagnosis not present

## 2019-01-24 DIAGNOSIS — E1165 Type 2 diabetes mellitus with hyperglycemia: Secondary | ICD-10-CM | POA: Diagnosis present

## 2019-01-24 DIAGNOSIS — Z8249 Family history of ischemic heart disease and other diseases of the circulatory system: Secondary | ICD-10-CM | POA: Diagnosis not present

## 2019-01-24 DIAGNOSIS — N179 Acute kidney failure, unspecified: Secondary | ICD-10-CM | POA: Diagnosis not present

## 2019-01-24 DIAGNOSIS — I340 Nonrheumatic mitral (valve) insufficiency: Secondary | ICD-10-CM

## 2019-01-24 DIAGNOSIS — R809 Proteinuria, unspecified: Secondary | ICD-10-CM | POA: Diagnosis not present

## 2019-01-24 DIAGNOSIS — I361 Nonrheumatic tricuspid (valve) insufficiency: Secondary | ICD-10-CM | POA: Diagnosis not present

## 2019-01-24 DIAGNOSIS — I5022 Chronic systolic (congestive) heart failure: Secondary | ICD-10-CM | POA: Diagnosis not present

## 2019-01-24 DIAGNOSIS — D631 Anemia in chronic kidney disease: Secondary | ICD-10-CM | POA: Diagnosis present

## 2019-01-24 DIAGNOSIS — R42 Dizziness and giddiness: Secondary | ICD-10-CM | POA: Diagnosis not present

## 2019-01-24 DIAGNOSIS — E876 Hypokalemia: Secondary | ICD-10-CM | POA: Diagnosis not present

## 2019-01-24 DIAGNOSIS — I480 Paroxysmal atrial fibrillation: Secondary | ICD-10-CM | POA: Diagnosis present

## 2019-01-24 DIAGNOSIS — I071 Rheumatic tricuspid insufficiency: Secondary | ICD-10-CM | POA: Diagnosis present

## 2019-01-24 DIAGNOSIS — Z833 Family history of diabetes mellitus: Secondary | ICD-10-CM | POA: Diagnosis not present

## 2019-01-24 DIAGNOSIS — I5043 Acute on chronic combined systolic (congestive) and diastolic (congestive) heart failure: Secondary | ICD-10-CM

## 2019-01-24 DIAGNOSIS — E0865 Diabetes mellitus due to underlying condition with hyperglycemia: Secondary | ICD-10-CM | POA: Diagnosis not present

## 2019-01-24 DIAGNOSIS — I10 Essential (primary) hypertension: Secondary | ICD-10-CM | POA: Diagnosis not present

## 2019-01-24 DIAGNOSIS — I160 Hypertensive urgency: Secondary | ICD-10-CM | POA: Diagnosis not present

## 2019-01-24 DIAGNOSIS — Z20828 Contact with and (suspected) exposure to other viral communicable diseases: Secondary | ICD-10-CM | POA: Diagnosis present

## 2019-01-24 DIAGNOSIS — N184 Chronic kidney disease, stage 4 (severe): Secondary | ICD-10-CM | POA: Diagnosis present

## 2019-01-24 DIAGNOSIS — Z825 Family history of asthma and other chronic lower respiratory diseases: Secondary | ICD-10-CM | POA: Diagnosis not present

## 2019-01-24 DIAGNOSIS — Z7902 Long term (current) use of antithrombotics/antiplatelets: Secondary | ICD-10-CM | POA: Diagnosis not present

## 2019-01-24 DIAGNOSIS — E1122 Type 2 diabetes mellitus with diabetic chronic kidney disease: Secondary | ICD-10-CM | POA: Diagnosis present

## 2019-01-24 DIAGNOSIS — I272 Pulmonary hypertension, unspecified: Secondary | ICD-10-CM | POA: Diagnosis present

## 2019-01-24 DIAGNOSIS — Z9114 Patient's other noncompliance with medication regimen: Secondary | ICD-10-CM | POA: Diagnosis not present

## 2019-01-24 DIAGNOSIS — I2510 Atherosclerotic heart disease of native coronary artery without angina pectoris: Secondary | ICD-10-CM | POA: Diagnosis present

## 2019-01-24 DIAGNOSIS — E113599 Type 2 diabetes mellitus with proliferative diabetic retinopathy without macular edema, unspecified eye: Secondary | ICD-10-CM | POA: Diagnosis present

## 2019-01-24 LAB — BASIC METABOLIC PANEL
Anion gap: 9 (ref 5–15)
BUN: 25 mg/dL — ABNORMAL HIGH (ref 8–23)
CO2: 23 mmol/L (ref 22–32)
Calcium: 8 mg/dL — ABNORMAL LOW (ref 8.9–10.3)
Chloride: 106 mmol/L (ref 98–111)
Creatinine, Ser: 2.8 mg/dL — ABNORMAL HIGH (ref 0.44–1.00)
GFR calc Af Amer: 20 mL/min — ABNORMAL LOW (ref >60)
GFR calc non Af Amer: 17 mL/min — ABNORMAL LOW (ref >60)
Glucose, Bld: 92 mg/dL (ref 70–99)
Potassium: 3.6 mmol/L (ref 3.5–5.1)
Sodium: 138 mmol/L (ref 135–145)

## 2019-01-24 LAB — HEPATIC FUNCTION PANEL
ALT: 14 U/L (ref 0–44)
AST: 19 U/L (ref 15–41)
Albumin: 2 g/dL — ABNORMAL LOW (ref 3.5–5.0)
Alkaline Phosphatase: 67 U/L (ref 38–126)
Bilirubin, Direct: <0.1 mg/dL (ref 0.0–0.2)
Total Bilirubin: 0.4 mg/dL (ref 0.3–1.2)
Total Protein: 4.9 g/dL — ABNORMAL LOW (ref 6.5–8.1)

## 2019-01-24 LAB — PROTIME-INR
INR: 1.1 (ref 0.8–1.2)
Prothrombin Time: 14.4 seconds (ref 11.4–15.2)

## 2019-01-24 LAB — NOVEL CORONAVIRUS, NAA (HOSP ORDER, SEND-OUT TO REF LAB; TAT 18-24 HRS): SARS-CoV-2, NAA: NOT DETECTED

## 2019-01-24 LAB — GLUCOSE, CAPILLARY
Glucose-Capillary: 81 mg/dL (ref 70–99)
Glucose-Capillary: 116 mg/dL — ABNORMAL HIGH (ref 70–99)
Glucose-Capillary: 188 mg/dL — ABNORMAL HIGH (ref 70–99)
Glucose-Capillary: 259 mg/dL — ABNORMAL HIGH (ref 70–99)

## 2019-01-24 LAB — ECHOCARDIOGRAM COMPLETE
Height: 66 in
Weight: 3064 oz

## 2019-01-24 MED ORDER — WARFARIN SODIUM 7.5 MG PO TABS
7.5000 mg | ORAL_TABLET | Freq: Once | ORAL | Status: AC
  Administered 2019-01-24: 7.5 mg via ORAL
  Filled 2019-01-24: qty 1

## 2019-01-24 MED ORDER — MAGNESIUM SULFATE 2 GM/50ML IV SOLN
2.0000 g | Freq: Once | INTRAVENOUS | Status: AC
  Administered 2019-01-24: 2 g via INTRAVENOUS
  Filled 2019-01-24: qty 50

## 2019-01-24 NOTE — Consult Note (Addendum)
Cardiology Consultation:   Patient ID: Joleah Kosak; [id number]; [birth year]   Admit date: 01/23/2019 Date of Consult: 01/24/2019  Primary Care Provider: Azzie Glatter, FNP Primary Cardiologist: Kate Sable, MD 01/18/2018 Primary Electrophysiologist:  None   Patient Profile:   Hadleigh Felber is a 63 y.o. female with a hx of  HTN, DM, HLD, CVA, GERD, anemia, DES x2 RCA 09/07/2017 w/ med rx for 40% LAD, prev LAD stent ok, EF 40-45% by echo 08/2017 w/ grade 3 dd, S-D-CHF, PAF on coumadin for CHA2DS2-VASc = 7 (CAD, DM, CHF, HTN, female, CVA x 2) was admitted 06/15 for CHF. She is being seen today for the evaluation of CHF at the request of Dr Eliseo Squires.  History of Present Illness:   Ms. Vanyo has not been weighing herself.  She says something is wrong her scales.  She has had them a while, when I suggested the battery might be dead, she admitted she did not know it had a battery.  She tries to eat a low-sodium diet, but admits she does not watch it that closely. She, her husband and her daughter all do shopping and cooking.  She is willing for a home health nurse to come out and is willing to see a nutritionist.  She has noticed gradually increasing lower extremity edema for a while.  She has also noted increased dyspnea on exertion.  She has had some PND and orthopnea.  She has support stockings at home, but quit wearing them when it got hot.  She was using a days of the week pillbox, but it is old and some of the tops are missing so is not able to use that anymore.  However, she thinks she takes her medications as prescribed.  She is breathing better since being given IV Lasix.  She is not back to baseline.  On 4/20, her weight was 186 pounds.   Past Medical History:  Diagnosis Date  . Anemia   . Chronic combined systolic and diastolic CHF (congestive heart failure) ([place])   . CKD (chronic kidney disease), stage IV ([place])   . Coronary artery disease 08/2017   DES x 2 RCA  .  GERD (gastroesophageal reflux disease)   . Heart murmur   . Hyperlipidemia   . Hypertension   . Proliferative diabetic retinopathy ([place])    left eye with vitreous hemorrhage and tractional retinal detachment  . Stroke ([place]) 09/2014   numbness left upper lip, finger tips on left hand; "resolved" (05/18/2016)  . Type II diabetes mellitus ([place])   . Wears glasses     Past Surgical History:  Procedure Laterality Date  . CARDIAC CATHETERIZATION N/A 05/18/2016   Procedure: Left Heart Cath and Coronary Angiography;  Surgeon: Jettie Booze, MD;  Location: [facility];  Service: Cardiovascular;  Laterality: N/A;  . CARDIAC CATHETERIZATION N/A 05/18/2016   Procedure: Coronary Stent Intervention;  Surgeon: Jettie Booze, MD;  Location: [facility];  Service: Cardiovascular;  Laterality: N/A;  . COLONOSCOPY W/ BIOPSIES AND POLYPECTOMY    . CORONARY ANGIOPLASTY    . CORONARY ANGIOPLASTY WITH STENT PLACEMENT    . CORONARY STENT INTERVENTION N/A 09/07/2017   Procedure: CORONARY STENT INTERVENTION;  Surgeon: Leonie Man, MD;  Location: [facility];  Service: Cardiovascular;  Laterality: N/A;  . EYE SURGERY    . GAS INSERTION Left 01/13/2018   Procedure: INSERTION OF GAS;  Surgeon: Bernarda Caffey, MD;  Location: [facility];  Service: Ophthalmology;  Laterality:  Left;  . LEFT HEART CATH AND CORONARY ANGIOGRAPHY N/A 09/07/2017   Procedure: LEFT HEART CATH AND CORONARY ANGIOGRAPHY;  Surgeon: Leonie Man, MD;  Location: [facility];  Service: Cardiovascular;  Laterality: N/A;  . MEMBRANE PEEL Left 01/13/2018   Procedure: MEMBRANE PEEL LEFT EYE ;  Surgeon: Bernarda Caffey, MD;  Location: [facility];  Service: Ophthalmology;  Laterality: Left;  Marland Kitchen MULTIPLE TOOTH EXTRACTIONS    . PARS PLANA VITRECTOMY Left 01/13/2018   Procedure: PARS PLANA VITRECTOMY WITH 25 GAUGE LEFT EYE WITH ENDOLASER;  Surgeon: Bernarda Caffey, MD;  Location: [place] [facility] [place];  Service: Ophthalmology;  Laterality: Left;  .  TUBAL LIGATION  1980  . VAGINAL HYSTERECTOMY  1999   "partial; fibroids"     Prior to Admission medications   Medication Sig Start Date End Date Taking? Authorizing Provider  acetaminophen (TYLENOL) 500 MG tablet Take 1,000 mg by mouth every 6 (six) hours as needed for mild pain or headache.   Yes [provider]  amLODipine (NORVASC) 10 MG tablet Take 1 tablet (10 mg total) by mouth daily. 08/25/18  Yes Azzie Glatter, FNP  atorvastatin (LIPITOR) 40 MG tablet Take 1 tablet (40 mg total) by mouth daily at 6 PM. 04/26/17  Yes Scot Jun, FNP  brimonidine (ALPHAGAN) 0.2 % ophthalmic solution Place 1 drop into the left eye 2 (two) times a day. 11/30/18  Yes Bernarda Caffey, MD  carvedilol (COREG) 25 MG tablet Take 0.5 tablets (12.5 mg total) by mouth 2 (two) times daily. 07/23/18  Yes Isabelle Course, MD  dorzolamide-timolol (COSOPT) 22.3-6.8 MG/ML ophthalmic solution Place 1 drop into the left eye 2 (two) times daily.   Yes [provider]  ferrous sulfate (FERROUSUL) 325 (65 FE) MG tablet Take 1 tablet (325 mg total) by mouth every other day. 11/22/17  Yes Herminio Commons, MD  furosemide (LASIX) 40 MG tablet Take 1 tablet (40 mg total) by mouth 2 (two) times daily. 01/23/19  Yes Azzie Glatter, FNP  glimepiride (AMARYL) 4 MG tablet Take 1 tablet (4 mg total) by mouth daily with breakfast. 01/23/19  Yes Azzie Glatter, FNP  glipiZIDE (GLUCOTROL) 10 MG tablet Take 1 tablet (10 mg total) by mouth 2 (two) times daily before a meal. 11/28/18  Yes Azzie Glatter, FNP  hydrALAZINE (APRESOLINE) 50 MG tablet Take 1.5 tablets (75 mg total) by mouth 3 (three) times daily. 11/22/17  Yes Herminio Commons, MD  hydrochlorothiazide (HYDRODIURIL) 25 MG tablet Take 1 tablet (25 mg total) by mouth daily. 08/25/18  Yes Azzie Glatter, FNP  insulin glargine (LANTUS) 100 UNIT/ML injection Inject 0.2 mLs (20 Units total) into the skin 2 (two) times daily. 03/30/18  Yes Azzie Glatter, FNP  nitroGLYCERIN (NITROSTAT) 0.4 MG SL tablet Place 1 tablet (0.4 mg total) under the tongue every 5 (five) minutes x 3 doses as needed for chest pain. 07/26/18  Yes Azzie Glatter, FNP  potassium chloride (K-DUR,KLOR-CON) 10 MEQ tablet Take 1 tablet with each dose of furosemide, daily. Patient taking differently: Take 10 mEq by mouth 2 (two) times daily. Take 1 tablet with each dose of furosemide, daily. 10/26/18  Yes Azzie Glatter, FNP  ticagrelor (BRILINTA) 90 MG TABS tablet Take 1 tablet (90 mg total) by mouth 2 (two) times daily. 07/15/17  Yes Tresa Garter, MD  warfarin (COUMADIN) 5 MG tablet Take 1 to 1.5 tablets by mouth daily as directed by coumadin clinic Patient taking differently:  Take 5 mg by mouth daily at 6 PM.  08/25/18  Yes Azzie Glatter, FNP  Glucose Blood (BLOOD GLUCOSE TEST STRIPS) STRP Use as directed 02/11/17   Hosie Poisson, MD  Insulin Pen Needle 29G X 10MM MISC Use as directed 09/22/17   Scot Jun, FNP  INSULIN SYRINGE .5CC/29G 29G X 1/2" 0.5 ML MISC Use to administer insulin three times daily 09/22/17   Scot Jun, FNP  TRUEPLUS PEN NEEDLES 31G X 6 MM MISC USE AS DIRECTED 06/02/18   Azzie Glatter, FNP    Inpatient Medications: Scheduled Meds: . amLODipine  10 mg Oral Daily  . atorvastatin  40 mg Oral q1800  . brimonidine  1 drop Left Eye BID  . carvedilol  12.5 mg Oral BID  . dorzolamide-timolol  1 drop Left Eye BID  . ferrous sulfate  325 mg Oral QODAY  . furosemide  40 mg Intravenous BID  . hydrALAZINE  75 mg Oral TID  . insulin aspart  0-15 Units Subcutaneous TID WC  . insulin aspart  0-5 Units Subcutaneous QHS  . insulin glargine  20 Units Subcutaneous QHS  . potassium chloride  10 mEq Oral BID  . sodium chloride flush  3 mL Intravenous Q12H  . ticagrelor  90 mg Oral BID  . warfarin  7.5 mg Oral ONCE-1800  . Warfarin - Pharmacist Dosing Inpatient   Does not apply q1800   Continuous Infusions: . sodium chloride    .  magnesium sulfate bolus IVPB     PRN Meds: sodium chloride, acetaminophen, ALPRAZolam, hydrALAZINE, nitroGLYCERIN, ondansetron (ZOFRAN) IV, sodium chloride flush  Allergies:    Allergies  Allergen Reactions  . Ace Inhibitors Cough    Social History:   Social History   Socioeconomic History  . Marital status: Married    Spouse name: Not on file  . Number of children: Not on file  . Years of education: 9  . Highest education level: Not on file  Occupational History  . Occupation: hairdresser  Social Needs  . Financial resource strain: Not on file  . Food insecurity    Worry: Not on file    Inability: Not on file  . Transportation needs    Medical: Not on file    Non-medical: Not on file  Tobacco Use  . Smoking status: Never Smoker  . Smokeless tobacco: Never Used  Substance and Sexual Activity  . Alcohol use: No    Alcohol/week: 0.0 standard drinks  . Drug use: No  . Sexual activity: Yes  Lifestyle  . Physical activity    Days per week: Not on file    Minutes per session: Not on file  . Stress: Not on file  Relationships  . Social Herbalist on phone: Not on file    Gets together: Not on file    Attends religious service: Not on file    Active member of club or organization: Not on file    Attends meetings of clubs or organizations: Not on file    Relationship status: Not on file  . Intimate partner violence    Fear of current or ex partner: Not on file    Emotionally abused: Not on file    Physically abused: Not on file    Forced sexual activity: Not on file  Other Topics Concern  . Not on file  Social History Narrative   Married, 2 children, hairdresser   Caffeine use- occasional tea or coffee  Right handed    Family History:   Family History  Problem Relation Age of Onset  . Diabetes Mother   . Hypertension Mother   . COPD Mother   . Heart failure Mother   . Hypertension Sister   . Hypertension Brother    Family Status:  Family  Status  Relation Name Status  . Mother  Alive  . Sister  Alive  . Brother  Alive  . Sister  Alive  . Father  Deceased    ROS:  Please see the history of present illness.  All other ROS reviewed and negative.     Physical Exam/Data:   Vitals:   01/24/19 0400 01/24/19 0406 01/24/19 0444 01/24/19 0821  BP:  (!) 152/87 107/73 (!) 156/76  Pulse:  (!) 59 73 (!) 59  Resp:  16  18  Temp:  97.8 F (36.6 C) 97.7 F (36.5 C) (!) 97.5 F (36.4 C)  TempSrc:  Oral Oral Oral  SpO2:  100% (!) 78% 100%  Weight: 86.9 kg     Height:        Intake/Output Summary (Last 24 hours) at 01/24/2019 1046 Last data filed at 01/24/2019 0809 Gross per 24 hour  Intake 582 ml  Output 300 ml  Net 282 ml   Filed Weights   01/23/19 1152 01/23/19 1630 01/24/19 0400  Weight: 88 kg 87.5 kg 86.9 kg   Body mass index is 30.91 kg/m.  General:  Well nourished, well developed, in no acute distress HEENT: normal Lymph: no adenopathy Neck: JVD 10 cm Endocrine:  No thryomegaly Vascular: No carotid bruits; 4/4 extremity pulses 2+, without bruits  Cardiac:  normal S1, S2; RRR; soft murmur  Lungs: Decreased breath sounds bases bilaterally, no wheezing, rhonchi or rales  Abd: soft, nontender, no hepatomegaly  Ext: 3+ lower extremity edema Musculoskeletal:  No deformities, BUE and BLE strength normal and equal Skin: warm and dry  Neuro:  CNs 2-12 intact, no focal abnormalities noted Psych:  Normal affect   EKG:  The EKG was personally reviewed and demonstrates: 6/15 ECG is sinus rhythm, heart rate 93, no acute ischemic changes Telemetry:  Telemetry was personally reviewed and demonstrates: Sinus rhythm, some sinus bradycardia in the high 50s  Relevant CV Studies:  ECHO: 6/16 ordered  ECHO: 07/21/2018 - Left ventricle: The cavity size was normal. Wall thickness was   increased in a pattern of mild LVH. Systolic function was mildly   to moderately reduced. The estimated ejection fraction was in the    range of 40% to 45%. Severe anteroseptal apical and inferoapical   akinesis. No apical thrombus was noted. The study is not   technically sufficient to allow evaluation of LV diastolic   function. - Mitral valve: Mildly thickened leaflets . There was moderate   regurgitation. - Left atrium: Moderately dilated. - Right ventricle: The cavity size was mildly dilated. Mild   systolic dysfunction. - Right atrium: The atrium was at the upper limits of normal in   size. - Tricuspid valve: There was mild regurgitation. - Pulmonary arteries: PA peak pressure: 29 mm Hg (S). - Inferior vena cava: The vessel was dilated. The respirophasic   diameter changes were blunted (< 50%), consistent with elevated   central venous pressure.  Impressions:  - Compared to a prior study in 08/2017, there are no significant   changes.  CATH: 29 2019  Prox RCA lesion is 75% stenosed.  A drug-eluting stent was successfully placed using a STENT PROMUS  PREM MR 2.75X16.  Post intervention, there is a 5% residual stenosis.  Ost RCA lesion is 85% stenosed.  A drug-eluting stent was successfully placed using a STENT PROMUS PREM MR 3.0X12. Overlaps Stent 1 proximally.  Post intervention, there is a 0% residual stenosis.  Mid RCA-1 lesion is 30% stenosed. Mid RCA-2 lesion is 25% stenosed.  Ost 1st Diag lesion is 30% stenosed. Ost 2nd Diag to 2nd Diag lesion is 80% stenosed. Stable.  Prox LAD lesion is 40% stenosed. Previously placed Mid LAD Promus drug eluting stent is widely patent.  There is mild left ventricular systolic dysfunction. The left ventricular ejection fraction is 45-50% by visual estimate.  LV end diastolic pressure is mildly elevated.   Severe Ostial & proximal RCA stenosis (progression of disease from Oct 2017).  Successful PCI using 2 overlapping DES.  Difficult, ostial lesion PCI requiring multiple balloons, buddy wire & extra support wire.  Plan:   Overnight monitoring in the  posterior unit (6C)  TR band removal per protocol  Continue DAPT -at least 1 more year\  Continue aggressive risk factor modification. Intervention     Laboratory Data:  Chemistry Recent Labs  Lab 01/23/19 1248 01/24/19 0535  NA 138 138  K 3.0* 3.6  CL 106 106  CO2 21* 23  GLUCOSE 176* 92  BUN 26* 25*  CREATININE 2.58* 2.80*  CALCIUM 8.1* 8.0*  GFRNONAA 19* 17*  GFRAA 22* 20*  ANIONGAP 11 9    Lab Results  Component Value Date   ALT 21 07/21/2018   AST 40 07/21/2018   ALKPHOS 79 07/21/2018   BILITOT 0.6 07/21/2018   Hematology Recent Labs  Lab 01/23/19 1248  WBC 4.4  RBC 4.77  HGB 11.1*  HCT 35.8*  MCV 75.1*  MCH 23.3*  MCHC 31.0  RDW 15.4  PLT 260   Cardiac Enzymes Recent Labs  Lab 01/23/19 1248  TROPONINI 0.03*     BNP Recent Labs  Lab 01/23/19 1715  BNP 2,666.4*    TSH:  Lab Results  Component Value Date   TSH 1.730 01/24/2018   Lipids: Lab Results  Component Value Date   CHOL 190 01/05/2017   HDL 80 01/05/2017   LDLCALC 95 01/05/2017   TRIG 74 01/05/2017   CHOLHDL 2.4 01/05/2017   HgbA1c: Lab Results  Component Value Date   HGBA1C 10.9 (H) 01/23/2019   Magnesium:  Magnesium  Date Value Ref Range Status  01/23/2019 1.6 (L) 1.7 - 2.4 mg/dL Final    Comment:    Performed at [facility], [place] [address], [place], [place] [address]   Lab Results  Component Value Date   INR 1.1 01/24/2019   INR 1.1 01/23/2019   INR 2.66 07/23/2018     Radiology/Studies:  Dg Chest Port 1 View  Result Date: 01/23/2019 CLINICAL DATA:  Congestive heart failure. Swelling of the lower extremities. EXAM: PORTABLE CHEST 1 VIEW COMPARISON:  07/20/2018 FINDINGS: There is cardiomegaly. There is pulmonary venous hypertension without frank edema. No effusions. No acute bone finding. Old healed left proximal humeral fracture. IMPRESSION: Cardiomegaly and pulmonary venous hypertension. No frank edema or effusion. Electronically Signed   By:  Nelson Chimes M.D.   On: 01/23/2019 13:32    Assessment and Plan:   1.  Acute on chronic combined systolic and diastolic CHF: -She definitely needs diuresis, but creatinine is becoming elevated above baseline. - Review meds with MD - dry weight was 186 lbs 11/28/2018, currently 191 lbs - doubt  I/O accurate, she is +282 cc since admission w/ only 300 cc out. If this is accurate, needs more Lasix. - wt down 1 kg.  - agrees to try compression socks, will get TED hose here - HHRN after d/c to help w/ compliance, needs a new scale or a battery for the old one.  2. CKD IV - GFR has been < 30 for over 6 months - however, has never been this high - continue to follow - per IM  3. HTN urgency - BP 212/121 on admission - 107/73 early this am, 156/76 now. - PTA, suspect med compliance is an issue - pt reports dizziness at times, need to see if this is 2nd lower BP  Otherwise, per IM. Mg has been supplemented Principal Problem:   Acute on chronic combined systolic (congestive) and diastolic (congestive) heart failure ([facility]) Active Problems:   Uncontrolled diabetes mellitus ([facility])   Hypokalemia   PAF (paroxysmal atrial fibrillation) ([facility])   CKD (chronic kidney disease), stage IV ([facility])   Hypertensive urgency   Essential hypertension   Acute on chronic heart failure ([place])     For questions or updates, please contact [facility] Please consult [email or web address] for contact info under Cardiology/STEMI.   Signed, Rosaria Ferries, PA-C  01/24/2019 10:46 AM   Pt seen and examined  I agree with findings of R Barrett above  Pt is a 63 yo with known HTN, DM, CAD, PAF and sytolic CHF    Presents with increased LE edema and increased SOB  Admits to not always eating what is good for her    On exam:  Pt has been hypertensive   Neck:  JVP is increased  Lungs are rel clear    Cardiac RRR  No S3  No signif murmurs Abd is benign Ext with 1+ edema     Agree with admit for IV diuresis .  Should  have dietary see pt to discuss low Na optons   Watch BP as resume meds and with diuresis  Follow telemetry     Dorris Carnes MD

## 2019-01-24 NOTE — Progress Notes (Addendum)
Progress Note    Amber Young  [id number]:[id number] DOB: 1956-01-27  DOA: 01/23/2019 PCP: Azzie Glatter, FNP    Brief Narrative:     Medical records reviewed and are as summarized below:  Amber Young is an 63 y.o. female with medical history significant of afib, CHF, CKD, diabetes presents to ED with cc LE edema, htn. Initial workup reveals acute on chronic heart failure, hypertensive urgency.   Assessment/Plan:   Principal Problem:   Acute on chronic combined systolic (congestive) and diastolic (congestive) heart failure (HCC) Active Problems:   Uncontrolled diabetes mellitus (HCC)   Hypokalemia   PAF (paroxysmal atrial fibrillation) (HCC)   CKD (chronic kidney disease), stage IV (HCC)   Hypertensive urgency   Essential hypertension   Acute on chronic heart failure (HCC)   Acute on chronic combined systolic and diastolic heart failure.  Likely multifactorial specifically missing doses of Lasix dietary indiscretion uncontrolled blood pressure.  Echo in December 2019 reveals an EF of 40% severe anteroseptal apical akinesis.  Home medications include Lasix, Coreg, HCTZ.  -monitor on tele -continue Lasix 40 mg IV -Repeat echo -Cardiology consult appreciated -Daily weights -Intake and output  Hypertensive urgency.  -  Home medications include hydralazine, Lasix, Coreg, Apresoline, hydrochlorothiazide, Norvasc.   -Resume home meds -Continue IV Lasix as noted above -PRN hydralazine  Paroxysmal A. fib.   -chadvasc score 4.  -continue home meds   Hypokalemia.  Likely related to 6.  Potassium level 3.0 on admission -Replete -replete magnesium   Diabetes.  Home medications include Lantus and oral agents. -Continue home Lantus -Oral agents as appetite unreliable -Sliding scale insulin for optimal control -diabetic coordinator  Chronic kidney disease/nephrotic syndrome -needs outpatient nephrology follow up -3+ protein in urine -needs better blood sugar  control    Family Communication/Anticipated D/C date and plan/Code Status   DVT prophylaxis: coumadin Code Status: Full Code.  Family Communication:  Disposition Plan: needs continued diuresis and close labs monitoring-- will need ARB started when diuresis complete   Medical Consultants:    cards     Subjective:   No chest pain  Objective:    Vitals:   01/24/19 0406 01/24/19 0444 01/24/19 0821 01/24/19 1138  BP: (!) 152/87 107/73 (!) 156/76 (!) 122/58  Pulse: (!) 59 73 (!) 59 (!) 57  Resp: 16  18 18   Temp: 97.8 F (36.6 C) 97.7 F (36.5 C) (!) 97.5 F (36.4 C) (!) 97.3 F (36.3 C)  TempSrc: Oral Oral Oral Oral  SpO2: 100% (!) 78% 100% 97%  Weight:      Height:        Intake/Output Summary (Last 24 hours) at 01/24/2019 1147 Last data filed at 01/24/2019 0809 Gross per 24 hour  Intake 582 ml  Output 300 ml  Net 282 ml   Filed Weights   01/23/19 1152 01/23/19 1630 01/24/19 0400  Weight: 88 kg 87.5 kg 86.9 kg    Exam: A+OX3 +LE edema rrr +BS, soft Diminished breath sounds at bases   Data Reviewed:   I have personally reviewed following labs and imaging studies:  Labs: Labs show the following:   Basic Metabolic Panel: Recent Labs  Lab 01/23/19 1248 01/23/19 1651 01/24/19 0535  NA 138  --  138  K 3.0*  --  3.6  CL 106  --  106  CO2 21*  --  23  GLUCOSE 176*  --  92  BUN 26*  --  25*  CREATININE 2.58*  --  2.80*  CALCIUM 8.1*  --  8.0*  MG  --  1.6*  --    GFR Estimated Creatinine Clearance: 23.1 mL/min (A) (by C-G formula based on SCr of 2.8 mg/dL (H)). Liver Function Tests: No results for input(s): AST, ALT, ALKPHOS, BILITOT, PROT, ALBUMIN in the last 168 hours. No results for input(s): LIPASE, AMYLASE in the last 168 hours. No results for input(s): AMMONIA in the last 168 hours. Coagulation profile Recent Labs  Lab 01/23/19 1248 01/24/19 0535  INR 1.1 1.1    CBC: Recent Labs  Lab 01/23/19 1248  WBC 4.4  HGB 11.1*   HCT 35.8*  MCV 75.1*  PLT 260   Cardiac Enzymes: Recent Labs  Lab 01/23/19 1248  TROPONINI 0.03*   BNP (last 3 results) No results for input(s): PROBNP in the last 8760 hours. CBG: Recent Labs  Lab 01/23/19 1729 01/23/19 2137 01/24/19 0628 01/24/19 1125  GLUCAP 191* 134* 81 116*   D-Dimer: No results for input(s): DDIMER in the last 72 hours. Hgb A1c: Recent Labs    01/23/19 1027 01/23/19 1651  HGBA1C 10.6* 10.9*   Lipid Profile: No results for input(s): CHOL, HDL, LDLCALC, TRIG, CHOLHDL, LDLDIRECT in the last 72 hours. Thyroid function studies: No results for input(s): TSH, T4TOTAL, T3FREE, THYROIDAB in the last 72 hours.  Invalid input(s): FREET3 Anemia work up: No results for input(s): VITAMINB12, FOLATE, FERRITIN, TIBC, IRON, RETICCTPCT in the last 72 hours. Sepsis Labs: Recent Labs  Lab 01/23/19 1248  WBC 4.4    Microbiology Recent Results (from the past 240 hour(s))  Novel Coronavirus,NAA,(SEND-OUT TO REF LAB - TAT 24-48 hrs); Hosp Order     Status: None   Collection Time: 01/23/19  2:54 PM   Specimen: Nasopharyngeal Swab; Respiratory  Result Value Ref Range Status   SARS-CoV-2, NAA NOT DETECTED NOT DETECTED Final    Comment: (NOTE) This test was developed and its performance characteristics determined by [employer]. This test has not been FDA cleared or approved. This test has been authorized by FDA under an Emergency Use Authorization (EUA). This test is only authorized for the duration of time the declaration that circumstances exist justifying the authorization of the emergency use of in vitro diagnostic tests for detection of SARS-CoV-2 virus and/or diagnosis of COVID-19 infection under section 564(b)(1) of the Act, 21 U.S.C. 716RCV-8(L)(3), unless the authorization is terminated or revoked sooner. When diagnostic testing is negative, the possibility of a false negative result should be considered in the context of a patient's  recent exposures and the presence of clinical signs and symptoms consistent with COVID-19. An individual without symptoms of COVID-19 and who is not shedding SARS-CoV-2 virus would expect to have a negative (not detected) result in this assay. Performed  At: [facility] [address] [place], [place] [id number] Rush Farmer MD HE:[id number]    [place]  Final    Comment: Performed at [facility], [place] [address], [place], [place] [address]    Procedures and diagnostic studies:  Dg Chest Port 1 View  Result Date: 01/23/2019 CLINICAL DATA:  Congestive heart failure. Swelling of the lower extremities. EXAM: PORTABLE CHEST 1 VIEW COMPARISON:  07/20/2018 FINDINGS: There is cardiomegaly. There is pulmonary venous hypertension without frank edema. No effusions. No acute bone finding. Old healed left proximal humeral fracture. IMPRESSION: Cardiomegaly and pulmonary venous hypertension. No frank edema or effusion. Electronically Signed   By: Nelson Chimes M.D.   On: 01/23/2019 13:32    Medications:   .  amLODipine  10 mg Oral Daily  . atorvastatin  40 mg Oral q1800  . brimonidine  1 drop Left Eye BID  . carvedilol  12.5 mg Oral BID  . dorzolamide-timolol  1 drop Left Eye BID  . ferrous sulfate  325 mg Oral QODAY  . furosemide  40 mg Intravenous BID  . hydrALAZINE  75 mg Oral TID  . insulin aspart  0-15 Units Subcutaneous TID WC  . insulin aspart  0-5 Units Subcutaneous QHS  . insulin glargine  20 Units Subcutaneous QHS  . potassium chloride  10 mEq Oral BID  . sodium chloride flush  3 mL Intravenous Q12H  . ticagrelor  90 mg Oral BID  . warfarin  7.5 mg Oral ONCE-1800  . Warfarin - Pharmacist Dosing Inpatient   Does not apply q1800   Continuous Infusions: . sodium chloride    . magnesium sulfate bolus IVPB       LOS: 0 days   Geradine Girt  [facility]   How to contact the [facility] Attending or Consulting provider [place] or  covering provider during after hours [place], for this patient?  1. Check the care team in [facility] and look for a) attending/consulting TRH provider listed and b) the [facility] team listed 2. Log into [email or web address] and use [place]'s universal password to access. If you do not have the password, please contact the hospital operator. 3. Locate the [facility] provider you are looking for under [facility] and page to a number that you can be directly reached. 4. If you still have difficulty reaching the provider, please page the [facility] (Director on Call) for the Hospitalists listed on amion for assistance.  01/24/2019, 11:47 AM

## 2019-01-24 NOTE — Progress Notes (Signed)
  Echocardiogram 2D Echocardiogram has been performed.  Amber Young 01/24/2019, 12:30 PM

## 2019-01-24 NOTE — Progress Notes (Addendum)
Inpatient Diabetes Program Recommendations  AACE/ADA: New Consensus Statement on Inpatient Glycemic Control (2015)  Target Ranges:  Prepandial:   less than 140 mg/dL      Peak postprandial:   less than 180 mg/dL (1-2 hours)      Critically ill patients:  140 - 180 mg/dL   Lab Results  Component Value Date   GLUCAP 116 (H) 01/24/2019   HGBA1C 10.9 (H) 01/23/2019    Review of Glycemic Control  Diabetes history: DM2 Outpatient Diabetes medications: Lantus 20 units qhs + Glucotrol 10 mg bid Current orders for Inpatient glycemic control: Lantus 20 units + Novolog moderate correction tid + hs 0-5 units  Inpatient Diabetes Program Recommendations:   Spoke with patient regarding A1c of 10.9 (average blood glucose 266 over the past 2-3 months). Patient states she is taking her insulin as prescribed but uncertain whether she has been taking both Amaryl & Glipizide or only one of the prescriptions. Attempted to call pharmacy @ [facility] but closed for lunch. Reviewed with patient need to check CBGs and take prescriptions as prescribed. A1c decreased from 11.2 11/28/18 to currently 10.9. 2:10 pm Spoke with [place] [facility] staff member and clarified prescriptions. Patient had been taking Lantus 20 units q hs and Glucotrol 10 mg bid. Pharmacy received a prescription yesterday to replace Glucotrol with Amaryl 4 mg daily.   Thank you, Nani Gasser. Keigan Girten, RN, MSN, CDE  Diabetes Coordinator Inpatient Glycemic Control Team Team Pager [phone] (8am-5pm) 01/24/2019 1:19 PM

## 2019-01-24 NOTE — Progress Notes (Signed)
Amber Young for warfarin Indication: atrial fibrillation  Allergies  Allergen Reactions  . Ace Inhibitors Cough    Patient Measurements: Height: 5\' 6"  (167.6 cm) Weight: 191 lb 8 oz (86.9 kg) IBW/kg (Calculated) : 59.3  Vital Signs: Temp: 97.5 F (36.4 C) (06/16 0821) Temp Source: Oral (06/16 0821) BP: 156/76 (06/16 0821) Pulse Rate: 59 (06/16 0821)  Labs: Recent Labs    01/23/19 1248 01/24/19 0535  HGB 11.1*  --   HCT 35.8*  --   PLT 260  --   LABPROT 13.8 14.4  INR 1.1 1.1  CREATININE 2.58* 2.80*  TROPONINI 0.03*  --     Estimated Creatinine Clearance: 23.1 mL/min (A) (by C-G formula based on SCr of 2.8 mg/dL (H)).   Medical History: Past Medical History:  Diagnosis Date  . Anemia   . CHF (congestive heart failure) ([place])   . CKD (chronic kidney disease), stage III ([place])   . Coronary artery disease   . GERD (gastroesophageal reflux disease)   . Heart murmur   . Hyperlipidemia   . Hypertension   . 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 Stroke ([place]) 09/2014   numbness left upper lip, finger tips on left hand; "resolved" (05/18/2016)  . Type II diabetes mellitus ([place])   . Wears glasses     Assessment: 44 yof presented to the ED with hypertension and dizziness. She is on chronic warfarin for history of afib. She also had recent cataract surgery. INR is subtherapeutic on admission. PTA dosing 5mg  daily  INR remains subtherapeutic at 1.1 with AM labs, no bleeding reported at this time.    Goal of Therapy:  INR 2-3 Monitor platelets by anticoagulation protocol: Yes   Plan:  Warfarin 7.5mg  PO x 1 tonight Daily INR, s/s bleeding  Bertis Ruddy, PharmD Clinical Pharmacist Please check AMION for all [place] numbers 01/24/2019 9:15 AM

## 2019-01-25 DIAGNOSIS — E0865 Diabetes mellitus due to underlying condition with hyperglycemia: Secondary | ICD-10-CM

## 2019-01-25 DIAGNOSIS — E876 Hypokalemia: Secondary | ICD-10-CM

## 2019-01-25 LAB — BASIC METABOLIC PANEL
Anion gap: 8 (ref 5–15)
BUN: 28 mg/dL — ABNORMAL HIGH (ref 8–23)
CO2: 23 mmol/L (ref 22–32)
Calcium: 8.3 mg/dL — ABNORMAL LOW (ref 8.9–10.3)
Chloride: 107 mmol/L (ref 98–111)
Creatinine, Ser: 2.95 mg/dL — ABNORMAL HIGH (ref 0.44–1.00)
GFR calc Af Amer: 19 mL/min — ABNORMAL LOW (ref >60)
GFR calc non Af Amer: 16 mL/min — ABNORMAL LOW (ref >60)
Glucose, Bld: 75 mg/dL (ref 70–99)
Potassium: 3.4 mmol/L — ABNORMAL LOW (ref 3.5–5.1)
Sodium: 138 mmol/L (ref 135–145)

## 2019-01-25 LAB — CBC
HCT: 33.9 % — ABNORMAL LOW (ref 36.0–46.0)
Hemoglobin: 10.8 g/dL — ABNORMAL LOW (ref 12.0–15.0)
MCH: 23.3 pg — ABNORMAL LOW (ref 26.0–34.0)
MCHC: 31.9 g/dL (ref 30.0–36.0)
MCV: 73.1 fL — ABNORMAL LOW (ref 80.0–100.0)
Platelets: 264 10*3/uL (ref 150–400)
RBC: 4.64 MIL/uL (ref 3.87–5.11)
RDW: 15.9 % — ABNORMAL HIGH (ref 11.5–15.5)
WBC: 3.8 10*3/uL — ABNORMAL LOW (ref 4.0–10.5)
nRBC: 0 % (ref 0.0–0.2)

## 2019-01-25 LAB — GLUCOSE, CAPILLARY
Glucose-Capillary: 88 mg/dL (ref 70–99)
Glucose-Capillary: 118 mg/dL — ABNORMAL HIGH (ref 70–99)
Glucose-Capillary: 213 mg/dL — ABNORMAL HIGH (ref 70–99)
Glucose-Capillary: 240 mg/dL — ABNORMAL HIGH (ref 70–99)

## 2019-01-25 LAB — PROTIME-INR
INR: 1.4 — ABNORMAL HIGH (ref 0.8–1.2)
Prothrombin Time: 17.4 seconds — ABNORMAL HIGH (ref 11.4–15.2)

## 2019-01-25 LAB — URINE CULTURE

## 2019-01-25 MED ORDER — INSULIN ASPART 100 UNIT/ML ~~LOC~~ SOLN
0.0000 [IU] | Freq: Three times a day (TID) | SUBCUTANEOUS | Status: DC
  Administered 2019-01-25: 3 [IU] via SUBCUTANEOUS
  Administered 2019-01-27: 2 [IU] via SUBCUTANEOUS
  Administered 2019-01-29: 1 [IU] via SUBCUTANEOUS

## 2019-01-25 MED ORDER — FUROSEMIDE 10 MG/ML IJ SOLN: 100.0000 mg | Freq: Two times a day (BID) | INTRAVENOUS | Status: DC

## 2019-01-25 MED ORDER — WARFARIN SODIUM 5 MG PO TABS
5.0000 mg | ORAL_TABLET | Freq: Once | ORAL | Status: AC
  Administered 2019-01-25: 5 mg via ORAL
  Filled 2019-01-25: qty 1

## 2019-01-25 MED ORDER — INSULIN ASPART 100 UNIT/ML ~~LOC~~ SOLN
0.0000 [IU] | Freq: Every day | SUBCUTANEOUS | Status: DC
  Administered 2019-01-25 – 2019-01-29 (×2): 2 [IU] via SUBCUTANEOUS

## 2019-01-25 MED ORDER — HYDRALAZINE HCL 50 MG PO TABS
100.0000 mg | ORAL_TABLET | Freq: Three times a day (TID) | ORAL | Status: DC
  Administered 2019-01-25 – 2019-01-30 (×16): 100 mg via ORAL
  Filled 2019-01-25 (×17): qty 2

## 2019-01-25 MED ORDER — FUROSEMIDE 10 MG/ML IJ SOLN
80.0000 mg | Freq: Two times a day (BID) | INTRAMUSCULAR | Status: DC
  Administered 2019-01-25 – 2019-01-30 (×10): 80 mg via INTRAVENOUS
  Filled 2019-01-25 (×10): qty 8

## 2019-01-25 NOTE — Progress Notes (Signed)
Progress Note    Amber Young  [id number] DOB: Apr 10, 1956  DOA: 01/23/2019 PCP: Azzie Glatter, FNP    Brief Narrative:     Medical records reviewed and are as summarized below:  Amber Young is an 63 y.o. female with medical history significant of afib, CHF, CKD, diabetes presents to ED with cc LE edema, htn. Initial workup reveals acute on chronic heart failure, hypertensive urgency.    Assessment/Plan:   Principal Problem:   Acute on chronic combined systolic (congestive) and diastolic (congestive) heart failure (HCC) Active Problems:   Uncontrolled diabetes mellitus (HCC)   Hypokalemia   PAF (paroxysmal atrial fibrillation) (HCC)   CKD (chronic kidney disease), stage IV (HCC)   Hypertensive urgency   Essential hypertension   Acute on chronic heart failure (HCC)   Acute on chronic combined systolic and diastolic heart failure.  Likely multifactorial specifically missing doses of Lasix dietary indiscretion uncontrolled blood pressure.  Echo in December 2019 reveals an EF of 40% severe anteroseptal apical akinesis.  Home medications include Lasix, Coreg, HCTZ.  -monitor on tele -continue Lasix 40 mg IV -Repeat echo shows diffuse hypokinesis with EF 25-30 -Cardiology consult appreciated -Daily weights -Intake and output  Hypertensive urgency.  -  Home medications include hydralazine, Lasix, Coreg, Apresoline, hydrochlorothiazide, Norvasc.   -Resume home meds -Continue IV Lasix as noted above -PRN hydralazine  Paroxysmal A. fib.   -chadvasc score 4.  -continue home meds including warfarin   Hypokalemia.  Likely related to 6.  Potassium level 3.0 on admission -Replete -replete magnesium   Diabetes.  Home medications include Lantus and oral agents. -Continue home Lantus -holding Oral agents as appetite unreliable -Sliding scale insulin for optimal control -diabetic coordinator  Chronic kidney disease/nephrotic syndrome -needs outpatient  nephrology follow up- has never seen nephrology -low albumin -3+ protein in urine -needs better blood sugar control as well as BP -was on losartan until Dec 2019-- held up on d/c for worsening kidney failure and hypotension-- but this was never re-started   Family Communication/Anticipated D/C date and plan/Code Status   DVT prophylaxis: coumadin Code Status: Full Code.  Family Communication:  Disposition Plan: needs continued diuresis and close labs monitoring-- will need ARB started when diuresis complete   Medical Consultants:    cards     Subjective:   No chest pain  Objective:    Vitals:   01/25/19 0115 01/25/19 0119 01/25/19 0412 01/25/19 0919  BP: 138/76  (!) 151/75 (!) 170/87  Pulse: (!) 58  (!) 59 68  Resp: 20  20 16   Temp: 98.1 F (36.7 C)  97.6 F (36.4 C) 97.6 F (36.4 C)  TempSrc: Oral  Oral Oral  SpO2: 99%  100% 99%  Weight:  88.5 kg    Height:        Intake/Output Summary (Last 24 hours) at 01/25/2019 1034 Last data filed at 01/25/2019 0920 Gross per 24 hour  Intake 598 ml  Output 1000 ml  Net -402 ml   Filed Weights   01/23/19 1630 01/24/19 0400 01/25/19 0119  Weight: 87.5 kg 86.9 kg 88.5 kg    Exam: In chair, continues to have LE edema rrr No increased work of breathing Alert   Data Reviewed:   I have personally reviewed following labs and imaging studies:  Labs: Labs show the following:   Basic Metabolic Panel: Recent Labs  Lab 01/23/19 1248 01/23/19 1651 01/24/19 0535 01/25/19 0635  NA 138  --  138 138  K  3.0*  --  3.6 3.4*  CL 106  --  106 107  CO2 21*  --  23 23  GLUCOSE 176*  --  92 75  BUN 26*  --  25* 28*  CREATININE 2.58*  --  2.80* 2.95*  CALCIUM 8.1*  --  8.0* 8.3*  MG  --  1.6*  --   --    GFR Estimated Creatinine Clearance: 22.2 mL/min (A) (by C-G formula based on SCr of 2.95 mg/dL (H)). Liver Function Tests: Recent Labs  Lab 01/24/19 0624  AST 19  ALT 14  ALKPHOS 67  BILITOT 0.4  PROT 4.9*   ALBUMIN 2.0*   No results for input(s): LIPASE, AMYLASE in the last 168 hours. No results for input(s): AMMONIA in the last 168 hours. Coagulation profile Recent Labs  Lab 01/23/19 1248 01/24/19 0535 01/25/19 0635  INR 1.1 1.1 1.4*    CBC: Recent Labs  Lab 01/23/19 1248 01/25/19 0635  WBC 4.4 3.8*  HGB 11.1* 10.8*  HCT 35.8* 33.9*  MCV 75.1* 73.1*  PLT 260 264   Cardiac Enzymes: Recent Labs  Lab 01/23/19 1248  TROPONINI 0.03*   BNP (last 3 results) No results for input(s): PROBNP in the last 8760 hours. CBG: Recent Labs  Lab 01/24/19 0628 01/24/19 1125 01/24/19 1601 01/24/19 2120 01/25/19 0600  GLUCAP 81 116* 188* 259* 88   D-Dimer: No results for input(s): DDIMER in the last 72 hours. Hgb A1c: Recent Labs    01/23/19 1027 01/23/19 1651  HGBA1C 10.6* 10.9*   Lipid Profile: No results for input(s): CHOL, HDL, LDLCALC, TRIG, CHOLHDL, LDLDIRECT in the last 72 hours. Thyroid function studies: No results for input(s): TSH, T4TOTAL, T3FREE, THYROIDAB in the last 72 hours.  Invalid input(s): FREET3 Anemia work up: No results for input(s): VITAMINB12, FOLATE, FERRITIN, TIBC, IRON, RETICCTPCT in the last 72 hours. Sepsis Labs: Recent Labs  Lab 01/23/19 1248 01/25/19 0635  WBC 4.4 3.8*    Microbiology Recent Results (from the past 240 hour(s))  Urine Culture     Status: None   Collection Time: 01/23/19 10:53 AM   Specimen: Urine   UR  Result Value Ref Range Status   Urine Culture, Routine Final report  Final   Organism ID, Bacteria Comment  Final    Comment: Mixed urogenital flora 10,000-25,000 colony forming units per mL   Novel Coronavirus,NAA,(SEND-OUT TO REF LAB - TAT 24-48 hrs); Hosp Order     Status: None   Collection Time: 01/23/19  2:54 PM   Specimen: Nasopharyngeal Swab; Respiratory  Result Value Ref Range Status   SARS-CoV-2, NAA NOT DETECTED NOT DETECTED Final    Comment: (NOTE) This test was developed and its performance  characteristics determined by [employer]. This test has not been FDA cleared or approved. This test has been authorized by FDA under an Emergency Use Authorization (EUA). This test is only authorized for the duration of time the declaration that circumstances exist justifying the authorization of the emergency use of in vitro diagnostic tests for detection of SARS-CoV-2 virus and/or diagnosis of COVID-19 infection under section 564(b)(1) of the Act, 21 U.S.C. 161WRU-0(A)(5), unless the authorization is terminated or revoked sooner. When diagnostic testing is negative, the possibility of a false negative result should be considered in the context of a patient's recent exposures and the presence of clinical signs and symptoms consistent with COVID-19. An individual without symptoms of COVID-19 and who is not shedding SARS-CoV-2 virus would expect to have a negative (  not detected) result in this assay. Performed  At: [facility] [address] [place], [place] [id number] Rush Farmer MD ST:[id number]    [place]  Final    Comment: Performed at [facility], [place] [address], [place], [place] [address]    Procedures and diagnostic studies:  Dg Chest Port 1 View  Result Date: 01/23/2019 CLINICAL DATA:  Congestive heart failure. Swelling of the lower extremities. EXAM: PORTABLE CHEST 1 VIEW COMPARISON:  07/20/2018 FINDINGS: There is cardiomegaly. There is pulmonary venous hypertension without frank edema. No effusions. No acute bone finding. Old healed left proximal humeral fracture. IMPRESSION: Cardiomegaly and pulmonary venous hypertension. No frank edema or effusion. Electronically Signed   By: Nelson Chimes M.D.   On: 01/23/2019 13:32    Medications:   . atorvastatin  40 mg Oral q1800  . brimonidine  1 drop Left Eye BID  . carvedilol  12.5 mg Oral BID  . dorzolamide-timolol  1 drop Left Eye BID  . ferrous sulfate  325 mg Oral QODAY   . furosemide  40 mg Intravenous BID  . hydrALAZINE  100 mg Oral TID  . insulin aspart  0-5 Units Subcutaneous QHS  . insulin aspart  0-9 Units Subcutaneous TID WC  . insulin glargine  20 Units Subcutaneous QHS  . potassium chloride  10 mEq Oral BID  . sodium chloride flush  3 mL Intravenous Q12H  . ticagrelor  90 mg Oral BID  . warfarin  5 mg Oral ONCE-1800  . Warfarin - Pharmacist Dosing Inpatient   Does not apply q1800   Continuous Infusions: . sodium chloride       LOS: 1 day   Geradine Girt  [facility]   How to contact the [facility] Attending or Consulting provider [place] or covering provider during after hours [place], for this patient?  1. Check the care team in [facility] and look for a) attending/consulting TRH provider listed and b) the [facility] team listed 2. Log into [email or web address] and use [place]'s universal password to access. If you do not have the password, please contact the hospital operator. 3. Locate the [facility] provider you are looking for under [facility] and page to a number that you can be directly reached. 4. If you still have difficulty reaching the provider, please page the [facility] (Director on Call) for the Hospitalists listed on amion for assistance.  01/25/2019, 10:34 AM

## 2019-01-25 NOTE — Progress Notes (Signed)
Progress Note  Patient Name: Amber Young Date of Encounter: 01/25/2019  Primary Cardiologist: Kate Sable, MD   Subjective   Breathing OK at rest  No CP   Inpatient Medications    Scheduled Meds: . amLODipine  10 mg Oral Daily  . atorvastatin  40 mg Oral q1800  . brimonidine  1 drop Left Eye BID  . carvedilol  12.5 mg Oral BID  . dorzolamide-timolol  1 drop Left Eye BID  . ferrous sulfate  325 mg Oral QODAY  . furosemide  40 mg Intravenous BID  . hydrALAZINE  75 mg Oral TID  . insulin aspart  0-15 Units Subcutaneous TID WC  . insulin aspart  0-5 Units Subcutaneous QHS  . insulin glargine  20 Units Subcutaneous QHS  . potassium chloride  10 mEq Oral BID  . sodium chloride flush  3 mL Intravenous Q12H  . ticagrelor  90 mg Oral BID  . Warfarin - Pharmacist Dosing Inpatient   Does not apply q1800   Continuous Infusions: . sodium chloride     PRN Meds: sodium chloride, acetaminophen, ALPRAZolam, hydrALAZINE, nitroGLYCERIN, ondansetron (ZOFRAN) IV, sodium chloride flush   Vital Signs    Vitals:   01/24/19 2004 01/25/19 0115 01/25/19 0119 01/25/19 0412  BP: (!) 145/74 138/76  (!) 151/75  Pulse: (!) 58 (!) 58  (!) 59  Resp: 20 20  20   Temp: (!) 97.5 F (36.4 C) 98.1 F (36.7 C)  97.6 F (36.4 C)  TempSrc: Oral Oral  Oral  SpO2: 100% 99%  100%  Weight:   88.5 kg   Height:        Intake/Output Summary (Last 24 hours) at 01/25/2019 0743 Last data filed at 01/25/2019 0651 Gross per 24 hour  Intake 720 ml  Output 1000 ml  Net -280 ml   Filed Weights   01/23/19 1630 01/24/19 0400 01/25/19 0119  Weight: 87.5 kg 86.9 kg 88.5 kg    Telemetry    SR - Personally Reviewed  Physical Exam   Physical exam per MD:  GEN: No acute distress.   Neck: JVP is increased, no carotid bruits Cardiac: RRR, no murmurs, rubs, or gallops.  Respiratory: Clear to auscultation bilaterally, no wheezes/ rales/ rhonchi GI: NABS, Soft, nontender, non-distended  MS: 1-2+  edema; No deformity. Neuro:  Nonfocal, moving all extremities spontaneously Psych: Normal affect   Labs    Chemistry Recent Labs  Lab 01/23/19 1248 01/24/19 0535 01/24/19 0624  NA 138 138  --   K 3.0* 3.6  --   CL 106 106  --   CO2 21* 23  --   GLUCOSE 176* 92  --   BUN 26* 25*  --   CREATININE 2.58* 2.80*  --   CALCIUM 8.1* 8.0*  --   PROT  --   --  4.9*  ALBUMIN  --   --  2.0*  AST  --   --  19  ALT  --   --  14  ALKPHOS  --   --  67  BILITOT  --   --  0.4  GFRNONAA 19* 17*  --   GFRAA 22* 20*  --   ANIONGAP 11 9  --      Hematology Recent Labs  Lab 01/23/19 1248  WBC 4.4  RBC 4.77  HGB 11.1*  HCT 35.8*  MCV 75.1*  MCH 23.3*  MCHC 31.0  RDW 15.4  PLT 260    Cardiac Enzymes Recent Labs  Lab 01/23/19 1248  TROPONINI 0.03*   No results for input(s): TROPIPOC in the last 168 hours.   BNP Recent Labs  Lab 01/23/19 1715  BNP 2,666.4*     DDimer No results for input(s): DDIMER in the last 168 hours.   Radiology    Dg Chest Port 1 View  Result Date: 01/23/2019 CLINICAL DATA:  Congestive heart failure. Swelling of the lower extremities. EXAM: PORTABLE CHEST 1 VIEW COMPARISON:  07/20/2018 FINDINGS: There is cardiomegaly. There is pulmonary venous hypertension without frank edema. No effusions. No acute bone finding. Old healed left proximal humeral fracture. IMPRESSION: Cardiomegaly and pulmonary venous hypertension. No frank edema or effusion. Electronically Signed   By: Nelson Chimes M.D.   On: 01/23/2019 13:32    Cardiac Studies   Echocardiogram 01/24/2019: IMPRESSIONS    1. The left ventricle has severely reduced systolic function, with an ejection fraction of 25-30%. The cavity size was normal. There is mildly increased left ventricular wall thickness. Left ventricular diastolic Doppler parameters are consistent with  pseudonormalization. Left ventricular diffuse hypokinesis.  2. The right ventricle has mildly reduced systolic function. The  cavity was mildly enlarged. Right ventricular systolic pressure is moderately elevated with an estimated pressure of 43.3 mmHg.  3. Left atrial size was moderately dilated.  4. Right atrial size was mildly dilated.  5. The mitral valve is grossly normal. Mild thickening of the mitral valve leaflet. There is mild mitral annular calcification present.  6. The tricuspid valve is grossly normal. Tricuspid valve regurgitation is moderate.  7. The aortic valve is tricuspid. Mild thickening of the aortic valve. No stenosis of the aortic valve.  8. Global hypokinesis with akinesis of the mid septum; overall severe LV dysfunction; mild LVH; moderate diastolic dysfunction; mild MR; biatrial enlargement; mild RVE with mild RV dysfunction; moderate TR; moderate pulmonary hypertension.  ECHO: 07/21/2018 - Left ventricle: The cavity size was normal. Wall thickness wasincreased in a pattern of mild LVH. Systolic function was mildlyto moderately reduced. The estimated ejection fraction was in therange of 40% to 45%. Severe anteroseptal apical and inferoapicalakinesis. No apical thrombus was noted. The study is nottechnically sufficient to allow evaluation of LV diastolicfunction. - Mitral valve: Mildly thickened leaflets . There was moderateregurgitation. - Left atrium: Moderately dilated. - Right ventricle: The cavity size was mildly dilated. Mildsystolic dysfunction. - Right atrium: The atrium was at the upper limits of normal insize. - Tricuspid valve: There was mild regurgitation. - Pulmonary arteries: PA peak pressure: 29 mm Hg (S). - Inferior vena cava: The vessel was dilated. The respirophasicdiameter changes were blunted (<50%), consistent with elevatedcentral venous pressure.  Impressions:  - Compared to a prior study in 08/2017, there are no significantchanges.  CATH: 08/2017  Prox RCA lesion is 75% stenosed.  A drug-eluting stent was successfully placed using a STENT PROMUS PREM  MR 2.75X16.  Post intervention, there is a 5% residual stenosis.  Ost RCA lesion is 85% stenosed.  A drug-eluting stent was successfully placed using a STENT PROMUS PREM MR 3.0X12. Overlaps Stent 1 proximally.  Post intervention, there is a 0% residual stenosis.  Mid RCA-1 lesion is 30% stenosed. Mid RCA-2 lesion is 25% stenosed.  Ost 1st Diag lesion is 30% stenosed. Ost 2nd Diag to 2nd Diag lesion is 80% stenosed. Stable.  Prox LAD lesion is 40% stenosed. Previously placed Mid LAD Promus drug eluting stent is widely patent.  There is mild left ventricular systolic dysfunction. The left ventricular ejection fraction is 45-50% by visual  estimate.  LV end diastolic pressure is mildly elevated.  Severe Ostial &proximal RCA stenosis (progression of disease from Oct 2017).  Successful PCI using 2 overlapping DES. Difficult, ostial lesion PCI requiring multiple balloons, buddy wire &extra support wire.  Plan:   Overnight monitoring in the posterior unit (6C)  TR band removal per protocol  Continue DAPT -at least 1 more year\  Continue aggressive risk factor modification.  Patient Profile     63 y.o. female with a hx of HTN, DM, HLD, CVA, GERD,anemia, DES x2 RCA 1/29/2019w/ med rx for 40% LAD, prev LAD stent ok, EF 40-45% by echo 08/2017 w/ grade 3 dd, S-D-CHF, PAF on coumadin, who is being followed by cardiology for CHF.   Assessment & Plan    1. Acute on chronic combined CHF: patient presented with DOE and LE edema c/f CHF exacerbation possibly contributed to by medication non-compliance.  She admits to not eating the best.  Echo 01/24/2019 with drop in EF from 40-45% to 25-30% now with G2DD and diffuse hypokinesis of the LV. She has been diuresing with IV lasix which has been limited by renal insufficiency. UOP with net -280mL this admission. Weight is up 4lbs from yesterday at 195lbs today (error?).  WIll increase lasix today and follow   - Will review echo with drop in  LVEF  ? ischemic - Will stop amlodipine at this time and uptitrate hydralazine  - Continue carvedilol   2. HTN: patient presented with hypertensive urgency likely 2/2 medication non-compliance. BP improved but remains high. Possible this has contributed to drop in EF.  - Will stop amlodipine given drop in EF - Uptitrate hydralazine to 100mg  TID - Continue carvedilol  3. CKD stage IV: Cr pending this morning but up to 2.8 yesterday (baseline 2.5).  - Consider nephrology consult if does not respond - Continue to monitor closely with diuresis.   4. CAD s/p PCI/DES to RCA and ostial 08/2017: no complaints of chest pain. EKG non-ischemic. Trop 0.03 this admission. Echo with drop in EF to 25-30% this admission.  - Continue aspirin, brilinta, and statin  For questions or updates, please contact [facility] Please consult [email or web address] for contact info under Cardiology/STEMI.      Signed, Abigail Butts, PA-C  01/25/2019, 7:43 AM   [phone]  Pt seen and examined  I have amened note above by Teodoro Kil to reflect my findings     On exam, pt comfortable but still with increased volume  Neck:  JVP is increased Lungs are CTA Cardiac exam  RRR No S3 Ext with 2+ edema.     REcomm increasing lasix  Follow response Switch to hydralazine from amldipine Dietary to see

## 2019-01-25 NOTE — Discharge Instructions (Signed)
Heart Healthy, Consistent Carbohydrate Nutrition Therapy   A heart-healthy and consistent carbohydrate diet is recommended to manage heart disease and diabetes. To follow a heart-healthy and consistent carbohydrate diet, Eat a balanced diet with whole grains, fruits and vegetables, and lean protein sources.  Choose heart-healthy unsaturated fats. Limit saturated fats, trans fats, and cholesterol intake. Eat more plant-based or vegetarian meals using beans and soy foods for protein.  Eat whole, unprocessed foods to limit the amount of sodium (salt) you eat.  Choose a consistent amount of carbohydrate at each meal and snack. Limit refined carbohydrates especially sugar, sweets and sugar-sweetened beverages.  If you drink alcohol, do so in moderation: one serving per day (women) and two servings per day (men). o One serving is equivalent to 12 ounces beer, 5 ounces wine, or 1.5 ounces distilled spirits  Tips Tips for Choosing Heart-Healthy Fats Choose lean protein and low-fat dairy foods to reduce saturated fat intake. Saturated fat is usually found in animal-based protein and is associated with certain health risks. Saturated fat is the biggest contributor to raise low-density lipoprotein (LDL) cholesterol levels. Research shows that limiting saturated fat lowers unhealthy cholesterol levels. Eat no more than 7% of your total calories each day from saturated fat. Ask your RDN to help you determine how much saturated fat is right for you. There are many foods that do not contain large amounts of saturated fats. Swapping these foods to replace foods high in saturated fats will help you limit the saturated fat you eat and improve your cholesterol levels. You can also try eating more plant-based or vegetarian meals. Instead of. Try:  Whole milk, cheese, yogurt, and ice cream 1% or skim milk, low-fat cheese, non-fat yogurt, and low-fat ice cream  Fatty, marbled beef and pork Lean beef, pork, or venison   Poultry with skin Poultry without skin  Butter, stick margarine Reduced-fat, whipped, or liquid spreads  Coconut oil, palm oil Liquid vegetable oils: corn, canola, olive, soybean and safflower oils   Avoid foods that contain trans fats. Trans fats increase levels of LDL-cholesterol. Hydrogenated fat in processed foods is the main source of trans fats in foods.  Trans fats can be found in stick margarine, shortening, processed sweets, baked goods, some fried foods, and packaged foods made with hydrogenated oils. Avoid foods with "partially hydrogenated oil" on the ingredient list such as: cookies, pastries, baked goods, biscuits, crackers, microwave popcorn, and frozen dinners. Choose foods with heart healthy fats. Polyunsaturated and monounsaturated fat are unsaturated fats that may help lower your blood cholesterol level when used in place of saturated fat in your diet. Ask your RDN about taking a dietary supplement with plant sterols and stanols to help lower your cholesterol level. Research shows that substituting saturated fats with unsaturated fats is beneficial to cholesterol levels. Try these easy swaps: Instead of. Try:  Butter, stick margarine, or solid shortening Reduced-fat, whipped, or liquid spreads  Beef, pork, or poultry with skin Fish and seafood  Chips, crackers, snack foods Raw or unsalted nuts and seeds or nut butters Hummus with vegetables Avocado on toast  Coconut oil, palm oil Liquid vegetable oils: corn, canola, olive, soybean and safflower oils  Limit the amount of cholesterol you eat to less than 200 milligrams per day. Cholesterol is a substance carried through the bloodstream via lipoproteins, which are known as "transporters" of fat. Some body functions need cholesterol to work properly, but too much cholesterol in the bloodstream can damage arteries and build up blood vessel linings (  which can lead to heart attack and stroke). You should eat less than 200 milligrams  cholesterol per day. People respond differently to eating cholesterol. There is no test available right now that can figure out which people will respond more to dietary cholesterol and which will respond less. For individuals with high intake of dietary cholesterol, different types of increase (none, small, moderate, large) in LDL-cholesterol levels are all possible.  Food sources of cholesterol include egg yolks and organ meats such as liver, gizzards. Limit egg yolks to two to four per week and avoid organ meats like liver and gizzards to control cholesterol intake. Tips for Choosing Heart-Healthy Carbohydrates Consume a consistent amount of carbohydrate It is important to eat foods with carbohydrates in moderation because they impact your blood glucose level. Carbohydrates can be found in many foods such as: Grains (breads, crackers, rice, pasta, and cereals)  Starchy Vegetables (potatoes, corn, and peas)  Beans and legumes  Milk, soy milk, and yogurt  Fruit and fruit juice  Sweets (cakes, cookies, ice cream, jam and jelly) Your RDN will help you set a goal for how many carbohydrate servings to eat at your meals and snacks. For many adults, eating 3 to 5 servings of carbohydrate foods at each meal and 1 or 2 carbohydrate servings for each snack works well.  Check your blood glucose level regularly. It can tell you if you need to adjust when you eat carbohydrates. Choose foods rich in viscous (soluble) fiber Viscous, or soluble, is found in the walls of plant cells. Viscous fiber is found only in plant-based foods. Eating foods with fiber helps to lower your unhealthy cholesterol and keep your blood glucose in range  Rich sources of viscous fiber include vegetables (asparagus, Brussels sprouts, sweet potatoes, turnips) fruit (apricots, mangoes, oranges), legumes, and whole grains (barley, oats, and oat bran).  As you increase your fiber intake gradually, also increase the amount of water you  drink. This will help prevent constipation.  If you have difficulty achieving this goal, ask your RDN about fiber laxatives. Choose fiber supplements made with viscous fibers such as psyllium seed husks or methylcellulose to help lower unhealthy cholesterol.  Limit refined carbohydrates  There are three types of carbohydrates: starches, sugar, and fiber. Some carbohydrates occur naturally in food, like the starches in rice or corn or the sugars in fruits and milk. Refined carbohydrates--foods with high amounts of simple sugars--can raise triglyceride levels. High triglyceride levels are associated with coronary heart disease. Some examples of refined carbohydrate foods are table sugar, sweets, and beverages sweetened with added sugar. Tips for Reducing Sodium (Salt) Although sodium is important for your body to function, too much sodium can be harmful for people with high blood pressure. As sodium and fluid buildup in your tissues and bloodstream, your blood pressure increases. High blood pressure may cause damage to other organs and increase your risk for a stroke. Even if you take a pill for blood pressure or a water pill (diuretic) to remove fluid, it is still important to have less salt in your diet. Ask your doctor and RDN what amount of sodium is right for you. Avoid processed foods. Eat more fresh foods.  Fresh fruits and vegetables are naturally low in sodium, as well as frozen vegetables and fruits that have no added juices or sauces.  Fresh meats are lower in sodium than processed meats, such as bacon, sausage, and hotdogs. Read the nutrition label or ask your butcher to help you find a   fresh meat that is low in sodium. Eat less salt--at the table and when cooking.  A single teaspoon of table salt has 2,300 mg of sodium.  Leave the salt out of recipes for pasta, casseroles, and soups.  Ask your RDN how to cook your favorite recipes without sodium Be a smart shopper.  Look for food packages  that say "salt-free" or "sodium-free." These items contain less than 5 milligrams of sodium per serving.  "Very low-sodium" products contain less than 35 milligrams of sodium per serving.  "Low-sodium" products contain less than 140 milligrams of sodium per serving.  Beware for "Unsalted" or "No Added Salt" products. These items may still be high in sodium. Check the nutrition label. Add flavors to your food without adding sodium.  Try lemon juice, lime juice, fruit juice or vinegar.  Dry or fresh herbs add flavor. Try basil, bay leaf, dill, rosemary, parsley, sage, dry mustard, nutmeg, thyme, and paprika.  Pepper, red pepper flakes, and cayenne pepper can add spice t your meals without adding sodium. Hot sauce contains sodium, but if you use just a drop or two, it will not add up to much.  Buy a sodium-free seasoning blend or make your own at home. Additional Lifestyle Tips Achieve and maintain a healthy weight. Talk with your RDN or your doctor about what is a healthy weight for you. Set goals to reach and maintain that weight.  To lose weight, reduce your calorie intake along with increasing your physical activity. A weight loss of 10 to 15 pounds could reduce LDL-cholesterol by 5 milligrams per deciliter. Participate in physical activity. Talk with your health care team to find out what types of physical activity are best for you. Set a plan to get about 30 minutes of exercise on most days.  Foods Recommended Food Group Foods Recommended  Grains Whole grain breads and cereals, including whole wheat, barley, rye, buckwheat, corn, teff, quinoa, millet, amaranth, brown or wild rice, sorghum, and oats Pasta, especially whole wheat or other whole grain types  Brown rice, quinoa or wild rice Whole grain crackers, bread, rolls, pitas Home-made bread with reduced-sodium baking soda  Protein Foods Lean cuts of beef and pork (loin, leg, round, extra lean hamburger)  Skinless poultry Fish Venison  and other wild game Dried beans and peas Nuts and nut butters Meat alternatives made with soy or textured vegetable protein  Egg whites or egg substitute Cold cuts made with lean meat or soy protein  Dairy Nonfat (skim), low-fat, or 1%-fat milk  Nonfat or low-fat yogurt or cottage cheese Fat-free and low-fat cheese  Vegetables Fresh, frozen, or canned vegetables without added fat or salt   Fruits Fresh, frozen, canned, or dried fruit   Oils Unsaturated oils (corn, olive, peanut, soy, sunflower, canola)  Soft or liquid margarines and vegetable oil spreads  Salad dressings Seeds and nuts  Avocado   Foods Not Recommended Food Group Foods Not Recommended  Grains Breads or crackers topped with salt Cereals (hot or cold) with more than 300 mg sodium per serving Biscuits, cornbread, and other "quick" breads prepared with baking soda Bread crumbs or stuffing mix from a store High-fat bakery products, such as doughnuts, biscuits, croissants, danish pastries, pies, cookies Instant cooking foods to which you add hot water and stir--potatoes, noodles, rice, etc. Packaged starchy foods--seasoned noodle or rice dishes, stuffing mix, macaroni and cheese dinner Snacks made with partially hydrogenated oils, including chips, cheese puffs, snack mixes, regular crackers, butter-flavored popcorn  Protein Foods   Higher-fat cuts of meats (ribs, t-bone steak, regular hamburger) Bacon, sausage, or hot dogs Cold cuts, such as salami or bologna, deli meats, cured meats, corned beef Organ meats (liver, brains, gizzards, sweetbreads) Poultry with skin Fried or smoked meat, poultry, and fish Whole eggs and egg yolks (more than 2-4 per week) Salted legumes, nuts, seeds, or nut/seed butters Meat alternatives with high levels of sodium (>300 mg per serving) or saturated fat (>5 g per serving)  Dairy Whole milk,?2% fat milk, buttermilk Whole milk yogurt or ice cream Cream Half-&-half Cream cheese Sour  cream Cheese  Vegetables Canned or frozen vegetables with salt, fresh vegetables prepared with salt, butter, cheese, or cream sauce Fried vegetables Pickled vegetables such as olives, pickles, or sauerkraut  Fruits Fried fruits Fruits served with butter or cream  Oils Butter, stick margarine, shortening Partially hydrogenated oils or trans fats Tropical oils (coconut, palm, palm kernel oils)  Other Candy, sugar sweetened soft drinks and desserts Salt, sea salt, garlic salt, and seasoning mixes containing salt Bouillon cubes Ketchup, barbecue sauce, Worcestershire sauce, soy sauce, teriyaki sauce Miso Salsa Pickles, olives, relish   Heart Healthy Consistent Carbohydrate Vegetarian (Lacto-Ovo) Sample 1-Day Menu  Breakfast 1 cup oatmeal, cooked (2 carbohydrate servings)   cup blueberries (1 carbohydrate serving)  11 almonds, without salt  1 cup 1% milk (1 carbohydrate serving)  1 cup coffee  Morning Snack 1 cup fat-free plain yogurt (1 carbohydrate serving)  Lunch 1 whole wheat bun (1 carbohydrate servings)  1 black bean burger (1 carbohydrate servings)  1 slice cheddar cheese, low sodium  2 slices tomatoes  2 leaves lettuce  1 teaspoon mustard  1 small pear (1 carbohydrate servings)  1 cup green tea, unsweetened  Afternoon Snack 1/3 cup trail mix with nuts, seeds, and raisins, without salt (1 carbohydrate servinga)  Evening Meal  cup meatless chicken  2/3 cup brown rice, cooked (2 carbohydrate servings)  1 cup broccoli, cooked (2/3 carbohydrate serving)   cup carrots, cooked (1/3 carbohydrate serving)  2 teaspoons olive oil  1 teaspoon balsamic vinegar  1 whole wheat dinner roll (1 carbohydrate serving)  1 teaspoon margarine, soft, tub  1 cup 1% milk (1 carbohydrate serving)  Evening Snack 1 extra small banana (1 carbohydrate serving)  1 tablespoon peanut butter   Heart Healthy Consistent Carbohydrate Vegan Sample 1-Day Menu  Breakfast 1 cup oatmeal, cooked (2  carbohydrate servings)   cup blueberries (1 carbohydrate serving)  11 almonds, without salt  1 cup soymilk fortified with calcium, vitamin B12, and vitamin D  1 cup coffee  Morning Snack 6 ounces soy yogurt (1 carbohydrate servings)  Lunch 1 whole wheat bun(1 carbohydrate servings)  1 black bean burger (1 carbohydrate serving)  2 slices tomatoes  2 leaves lettuce  1 teaspoon mustard  1 small pear (1 carbohydrate servings)  1 cup green tea, unsweetened  Afternoon Snack 1/3 cup trail mix with nuts, seeds, and raisins, without salt (1 carbohydrate servings)  Evening Meal  cup meatless chicken  2/3 cup brown rice, cooked (2 carbohydrate servings)  1 cup broccoli, cooked (2/3 carbohydrate serving)   cup carrots, cooked (1/3 carbohydrate serving)  2 teaspoons olive oil  1 teaspoon balsamic vinegar  1 whole wheat dinner roll (1 carbohydrate serving)  1 teaspoon margarine, soft, tub  1 cup soymilk fortified with calcium, vitamin B12, and vitamin D  Evening Snack 1 extra small banana (1 carbohydrate serving)  1 tablespoon peanut butter    Heart Healthy Consistent Carbohydrate Sample   teaspoon balsamic vinegar  1 whole wheat dinner roll (1 carbohydrate serving)  1 teaspoon margarine, soft, tub  1 cup soymilk fortified with calcium, vitamin B12, and vitamin D  Evening Snack 1 extra small banana (1 carbohydrate serving)  1 tablespoon peanut butter    Heart Healthy Consistent Carbohydrate Sample 1-Day Menu  Breakfast 1 cup cooked oatmeal (2 carbohydrate servings)  3/4 cup blueberries (1 carbohydrate serving)  1 ounce almonds  1 cup skim milk (1 carbohydrate serving)  1 cup coffee  Morning Snack 1 cup sugar-free nonfat yogurt (1 carbohydrate serving)  Lunch 2 slices whole-wheat bread (2 carbohydrate servings)  2 ounces lean [place] breast  1 ounce low-fat Swiss cheese  1 teaspoon mustard  1 slice tomato  1 lettuce leaf  1 small pear (1 carbohydrate serving)  1 cup skim milk (1 carbohydrate serving)  Afternoon Snack 1 ounce trail mix with unsalted nuts, seeds, and raisins (1 carbohydrate serving)  Evening Meal 3 ounces salmon  2/3 cup cooked brown rice (2 carbohydrate servings)  1 teaspoon  soft margarine  1 cup cooked broccoli with 1/2 cup cooked carrots (1 carbohydrate serving  Carrots, cooked, boiled, drained, without salt  1 cup lettuce  1 teaspoon olive oil with vinegar for dressing  1 small whole grain roll (1 carbohydrate serving)  1 teaspoon soft margarine  1 cup unsweetened tea  Evening Snack 1 extra-small banana (1 carbohydrate serving)  Copyright 2020  [employer]. All rights reserved.  Low Sodium Nutrition Therapy  Eating less sodium can help you if you have high blood pressure, heart failure, or kidney or liver disease.   Your body needs a little sodium, but too much sodium can cause your body to hold onto extra water. This extra water will raise your blood pressure and can cause damage to your heart, kidneys, or liver as they are forced to work harder.   Sometimes you can see how the extra fluid affects you because your hands, legs, or belly swell. You may also hold water around your heart and lungs, which makes it hard to breathe.   Even if you take medication for blood pressure or a water pill (diuretic) to remove fluid, it is still important to have less salt in your diet.   Check with your primary care provider before drinking alcohol since it may affect the amount of fluid in your body and how your heart, kidneys, or liver work. Sodium in Food A low-sodium meal plan limits the sodium that you get from food and beverages to 1,500-2,000 milligrams (mg) per day. Salt is the main source of sodium. Read the nutrition label on the package to find out how much sodium is in one serving of a food.   Select foods with 140 milligrams (mg) of sodium or less per serving.   You may be able to eat one or two servings of foods with a little more than 140 milligrams (mg) of sodium if you are closely watching how much sodium you eat in a day.   Check the serving size on the label. The amount of sodium listed on the label shows the amount in one  serving of the food. So, if you eat more than one serving, you will get more sodium than the amount listed.  Tips Cutting Back on Sodium  Eat more fresh foods.   Fresh fruits and vegetables are low in sodium, as well as frozen vegetables and fruits that have no added juices or  sauces.   Fresh meats are lower in sodium than processed meats, such as bacon, sausage, and hotdogs.   Not all processed foods are unhealthy, but some processed foods may have too much sodium.   Eat less salt at the table and when cooking. One of the ingredients in salt is sodium.   One teaspoon of table salt has 2,300 milligrams of sodium.   Leave the salt out of recipes for pasta, casseroles, and soups.  Be a Paramedic.   Food packages that say Salt-free, sodium-free, very low sodium, and low sodium have less than 140 milligrams of sodium per serving.   Beware of products identified as Unsalted, No Salt Added, Reduced Sodium, or Lower Sodium. These items may still be high in sodium. You should always check the nutrition label.  Add flavors to your food without adding sodium.   Try lemon juice, lime juice, or vinegar.   Dry or fresh herbs add flavor.   Buy a sodium-free seasoning blend or make your own at home.  You can purchase salt-free or sodium-free condiments like barbeque sauce in stores and online. Ask your registered dietitian nutritionist for recommendations and where to find them.    Eating in Restaurants  Choose foods carefully when you eat outside your home. Restaurant foods can be very high in sodium. Many restaurants provide nutrition facts on their menus or their websites. If you cannot find that information, ask your server. Let your server know that you want your food to be cooked without salt and that you would like your salad dressing and sauces to be served on the side.      Foods Recommended  Food Group  Foods Recommended   Grains  Bread, bagels, rolls  without salted tops Homemade bread made with reduced-sodium baking powder Cold cereals, especially shredded wheat and puffed rice Oats, grits, or cream of wheat Pastas, quinoa, and rice Popcorn, pretzels or crackers without salt Corn tortillas   Protein Foods  Fresh meats and fish; [place] bacon (check the nutrition labels - make sure they are not packaged in a sodium solution) Canned or packed tuna (no more than 4 ounces at 1 serving) Beans and peas Soybeans) and tofu Eggs Nuts or nut butters without salt   Dairy  Milk or milk powder Plant milks, such as rice and soy Yogurt, including Greek yogurt Small amounts of natural cheese (blocks of cheese) or reduced-sodium cheese can be used in moderation. (Swiss, ricotta, and fresh mozzarella cheese are lower in sodium than the others) Cream Cheese Low sodium cottage cheese   Vegetables  Fresh and frozen vegetables without added sauces or salt Homemade soups (without salt) Low-sodium, salt-free or sodium-free canned vegetables and soups   Fruit  Fresh and canned fruits Dried fruits, such as raisins, cranberries, and prunes   Oils  Tub or liquid margarine, regular or without salt Canola, corn, peanut, olive, safflower, or sunflower oils   Condiments  Fresh or dried herbs such as basil, bay leaf, dill, mustard (dry), nutmeg, paprika, parsley, rosemary, sage, or thyme.  Low sodium ketchup Vinegar  Lemon or lime juice Pepper, red pepper flakes, and cayenne. Hot sauce contains sodium, but if you use just a drop or two, it will not add up to much.  Salt-free or sodium-free seasoning mixes and marinades Simple salad dressings: vinegar and oil     Foods Not Recommended  Food Group  Foods Not Recommended   Grains  Breads or crackers topped with salt Cereals (hot/cold)  with more than 300 mg sodium per serving Biscuits, cornbread, and other quick breads prepared with baking soda Pre-packaged bread crumbs Seasoned and  packaged rice and pasta mixes Self-rising flours   Protein Foods  Cured meats: Bacon, ham, sausage, pepperoni and hot dogs Canned meats (chili, vienna sausage, or sardines) Smoked fish and meats Frozen meals that have more than 600 mg of sodium per serving Egg substitute (with added sodium)   Dairy  Buttermilk Processed cheese spreads Cottage cheese (1 cup may have over 500 mg of sodium; look for low-sodium.) American or feta cheese Shredded Cheese has more sodium than blocks of cheese String cheese   Vegetables  Canned vegetables (unless they are salt-free, sodium-free or low sodium) Frozen vegetables with seasoning and sauces Sauerkraut and pickled vegetables Canned or dried soups (unless they are salt-free, sodium-free, or low sodium) [place] fries and onion rings   Fruit  Dried fruits preserved with additives that have sodium   Oils  Salted butter or margarine, all types of olives   Condiments  Salt, sea salt, kosher salt, onion salt, and garlic salt Seasoning mixes with salt Bouillon cubes Ketchup Barbeque sauce and Worcestershire sauce unless low sodium Soy sauce Salsa, pickles, olives, relish Salad dressings: ranch, blue cheese, [place], and [place].     Low Sodium Sample 1-Day Menu   Breakfast  1 cup cooked oatmeal   1 slice whole wheat bread toast   1 tablespoon peanut butter without salt   1 banana   1 cup 1% milk   Lunch  Tacos made with: 2 corn tortillas    cup black beans, low sodium    cup roasted or grilled chicken (without skin)    avocado   Squeeze of lime juice   1 cup salad greens   1 tablespoon low-sodium salad dressing    cup strawberries   1 orange   Afternoon Snack  1/3 cup grapes   6 ounces yogurt   Evening Meal  3 ounces herb-baked fish   1 baked potato   2 teaspoons olive oil    cup cooked carrots   2 thick slices tomatoes on:   2 lettuce leaves   1 teaspoon olive oil   1 teaspoon balsamic  vinegar   1 cup 1% milk   Evening Snack  1 apple    cup almonds without salt     Low-Sodium Vegetarian (Lacto-Ovo) Sample 1-Day Menu   Breakfast  1 cup cooked oatmeal   1 slice whole wheat toast   1 tablespoon peanut butter without salt   1 banana   1 cup 1% milk   Lunch  Tacos made with: 2 corn tortillas    cup black beans, low sodium    cup roasted or grilled chicken (without skin)    avocado   Squeeze of lime juice   1 cup salad greens   1 tablespoon low-sodium salad dressing    cup strawberries   1 orange   Evening Meal  Stir fry made with:  cup tofu   1 cup brown rice    cup broccoli    cup green beans    cup peppers    tablespoon peanut oil   1 orange   1 cup 1% milk   Evening Snack  4 strips celery   2 tablespoons hummus   1 hard-boiled egg     Low-Sodium Vegan Sample 1-Day Menu   Breakfast  1 cup cooked oatmeal   1 tablespoon peanut butter without  salt   1 cup blueberries   1 cup soymilk fortified with calcium, vitamin B12, and vitamin D   Lunch  1 small whole wheat pita    cup cooked lentils   2 tablespoons hummus   4 carrot sticks   1 medium apple   1 cup soymilk fortified with calcium, vitamin B12, and vitamin D   Evening Meal  Stir fry made with:  cup tofu   1 cup brown rice    cup broccoli    cup green beans    cup peppers    tablespoon peanut oil   1 cup cantaloupe   Evening Snack  1 cup soy yogurt    cup mixed nuts   Copyright 2020  Academy of Nutrition and Dietetics. All rights reserved    Sodium Free Flavoring Tips    When cooking, the following items may be used for flavoring instead of salt or seasonings that contain sodium.  Remember: A little bit of spice goes a long way! Be careful not to overseason.  Spice Blend Recipe (makes about ? cup)  5 teaspoons onion powder   2 teaspoons garlic powder   2 teaspoons paprika   2 teaspoon dry mustard    1 teaspoon crushed thyme leaves    teaspoon white pepper    teaspoon celery seed Food Item Flavorings  Beef Basil, bay leaf, caraway, curry, dill, dry mustard, garlic, grape jelly, green pepper, mace, marjoram, mushrooms (fresh), nutmeg, onion or onion powder, parsley, pepper, rosemary, sage  Chicken Basil, cloves, cranberries, mace, mushrooms (fresh), nutmeg, oregano, paprika, parsley, pineapple, saffron, sage, savory, tarragon, thyme, tomato, turmeric  Egg Chervil, curry, dill, dry mustard, garlic or garlic powder, green pepper, jelly, mushrooms (fresh), nutmeg, onion powder, paprika, parsley, rosemary, tarragon, tomato  Fish Basil, bay leaf, chervil, curry, dill, dry mustard, green pepper, lemon juice, marjoram, mushrooms (fresh), paprika, pepper, tarragon, tomato, turmeric  Lamb Cloves, curry, dill, garlic or garlic powder, mace, mint, mint jelly, onion, oregano, parsley, pineapple, rosemary, tarragon, thyme  Pork Applesauce, basil, caraway, chives, cloves, garlic or garlic powder, onion or onion powder, rosemary, thyme  Veal Apricots, basil, bay leaf, currant jelly, curry, ginger, marjoram, mushrooms (fresh), oregano, paprika  Vegetables Basil, dill, garlic or garlic powder, ginger, lemon juice, mace, marjoram, nutmeg, onion or onion powder, tarragon, tomato, sugar or sugar substitute, salt-free salad dressing, vinegar  Desserts Allspice, anise, cinnamon, cloves, ginger, mace, nutmeg, vanilla extract, other extracts   Copyright 2020  Academy of Nutrition and Dietetics. All rights reserved

## 2019-01-25 NOTE — Progress Notes (Signed)
[place] for warfarin Indication: atrial fibrillation  Allergies  Allergen Reactions  . Ace Inhibitors Cough    Patient Measurements: Height: 5\' 6"  (167.6 cm) Weight: 195 lb 1.6 oz (88.5 kg) IBW/kg (Calculated) : 59.3  Vital Signs: Temp: 97.6 F (36.4 C) (06/17 0412) Temp Source: Oral (06/17 0412) BP: 151/75 (06/17 0412) Pulse Rate: 59 (06/17 0412)  Labs: Recent Labs    01/23/19 1248 01/24/19 0535 01/25/19 0635  HGB 11.1*  --  10.8*  HCT 35.8*  --  33.9*  PLT 260  --  264  LABPROT 13.8 14.4 17.4*  INR 1.1 1.1 1.4*  CREATININE 2.58* 2.80* 2.95*  TROPONINI 0.03*  --   --     Estimated Creatinine Clearance: 22.2 mL/min (A) (by C-G formula based on SCr of 2.95 mg/dL (H)).   Medical History: Past Medical History:  Diagnosis Date  . Anemia   . Chronic combined systolic and diastolic CHF (congestive heart failure) ([place])   . CKD (chronic kidney disease), stage IV ([place])   . Coronary artery disease 08/2017   DES x 2 RCA  . GERD (gastroesophageal reflux disease)   . Heart murmur   . Hyperlipidemia   . Hypertension   . Proliferative diabetic retinopathy ([place])    left eye with vitreous hemorrhage and tractional retinal detachment  . Stroke ([place]) 09/2014   numbness left upper lip, finger tips on left hand; "resolved" (05/18/2016)  . Type II diabetes mellitus ([place])   . Wears glasses     Assessment: 63 yof presented to the ED with hypertension and dizziness. She is on chronic warfarin for history of afib. She also had recent cataract surgery. INR is subtherapeutic on admission. PTA dosing 5mg  daily  INR increase from 1.1 > 1.4 s/p two doses of 7.5mg , remains subtherapeutic, eating 80% of meals currently.    Goal of Therapy:  INR 2-3 Monitor platelets by anticoagulation protocol: Yes   Plan:  Warfarin 5mg  PO x1 tonight Daily INR, s/s bleeding  Bertis Ruddy, PharmD Clinical Pharmacist Please check AMION for all [place] numbers 01/25/2019 8:29 AM

## 2019-01-25 NOTE — Plan of Care (Signed)
°  Problem: Education: °Goal: Ability to demonstrate management of disease process will improve °Outcome: Progressing °Goal: Ability to verbalize understanding of medication therapies will improve °Outcome: Progressing °Goal: Individualized Educational Video(s) °Outcome: Progressing °  °

## 2019-01-25 NOTE — Progress Notes (Signed)
Nutrition Education Note  RD working remotely.  RD consulted for nutrition education regarding CHF and diabetes.  Lab Results  Component Value Date   HGBA1C 10.9 (H) 01/23/2019   PTA DM medications are 20 units insulin glargine q HS and 10 mg glipizide BID.   Labs reviewed: CBGS: 88-118 (inpatient orders for glycemic control are 20 units [place]lin glargine daily, 0-9 units insulin aspart TID with meals, and 0-5 units insulin aspart q HS).   RD provided "Heart Healthy, Consistent Carbohydrate Nutrition Therapy", "Low Sodium Nutrition Therapy" and "Sodium Free Seasoning Tips" handouts from the Academy of Nutrition and Dietetics. Text of handouts pasted to AVS/discharge summary.  Attempted to speak with pt via phone x 2, however, no answer (phone line was busy on both attempts). Unable to speak with pt at time time.   This RD will be on site on Friday, 01/27/19. If pt is still in the hospital, RD will attempt to re-educate then.   Body mass index is 31.49 kg/m. Pt meets criteria for obesity, class I based on current BMI.  Current diet order is heart healthy/ carb modified, patient is consuming approximately 80-100% of meals at this time. Labs and medications reviewed. No further nutrition interventions warranted at this time. RD contact information provided. If additional nutrition issues arise, please re-consult RD.   Marlee Trentman A. Jimmye Norman, RD, LDN, [place] Registered Dietitian II Certified Diabetes Care and Education Specialist Pager: [phone] After hours Pager: [phone]

## 2019-01-25 NOTE — Plan of Care (Signed)
  Problem: Education: Goal: Ability to demonstrate management of disease process will improve Outcome: Progressing   Problem: Activity: Goal: Capacity to carry out activities will improve Outcome: Progressing   Problem: Health Behavior/Discharge Planning: Goal: Ability to manage health-related needs will improve Outcome: Progressing   Problem: Clinical Measurements: Goal: Diagnostic test results will improve Outcome: Progressing Goal: Respiratory complications will improve Outcome: Progressing Goal: Cardiovascular complication will be avoided Outcome: Progressing   Problem: Activity: Goal: Risk for activity intolerance will decrease Outcome: Progressing   Problem: Nutrition: Goal: Adequate nutrition will be maintained Outcome: Progressing   Problem: Coping: Goal: Level of anxiety will decrease Outcome: Progressing   Problem: Elimination: Goal: Will not experience complications related to urinary retention Outcome: Progressing   Problem: Pain Managment: Goal: General experience of comfort will improve Outcome: Progressing   Problem: Safety: Goal: Ability to remain free from injury will improve Outcome: Progressing

## 2019-01-25 NOTE — Progress Notes (Signed)
Inpatient Diabetes Program Recommendations  AACE/ADA: New Consensus Statement on Inpatient Glycemic Control (2015)  Target Ranges:  Prepandial:   less than 140 mg/dL      Peak postprandial:   less than 180 mg/dL (1-2 hours)      Critically ill patients:  140 - 180 mg/dL   Lab Results  Component Value Date   GLUCAP 88 01/25/2019   HGBA1C 10.9 (H) 01/23/2019    Review of Glycemic Control Results for Amber Young, Amber Young (MRN [id number]) as of 01/25/2019 10:06  Ref. Range 01/24/2019 06:28 01/24/2019 11:25 01/24/2019 16:01 01/24/2019 21:20 01/25/2019 06:00  Glucose-Capillary Latest Ref Range: 70 - 99 mg/dL 81 116 (H) 188 (H) 259 (H) 88   Diabetes history: DM2 Outpatient Diabetes medications: Lantus 20 units qhs + Glucotrol 10 mg bid Current orders for Inpatient glycemic control: Lantus 20 units + Novolog moderate correction tid + hs 0-5 units  Inpatient Diabetes Program Recommendations:   -Decrease Novolog correction to sensitive tid + hs 0-5 units   Thank you, Bethena Roys E. Aryam Zhan, RN, MSN, CDE  Diabetes Coordinator Inpatient Glycemic Control Team Team Pager [phone] (8am-5pm) 01/25/2019 10:09 AM

## 2019-01-26 ENCOUNTER — Ambulatory Visit: Payer: BLUE CROSS/BLUE SHIELD | Admitting: Family Medicine

## 2019-01-26 LAB — BASIC METABOLIC PANEL
Anion gap: 9 (ref 5–15)
BUN: 33 mg/dL — ABNORMAL HIGH (ref 8–23)
CO2: 24 mmol/L (ref 22–32)
Calcium: 8.6 mg/dL — ABNORMAL LOW (ref 8.9–10.3)
Chloride: 104 mmol/L (ref 98–111)
Creatinine, Ser: 3.39 mg/dL — ABNORMAL HIGH (ref 0.44–1.00)
GFR calc Af Amer: 16 mL/min — ABNORMAL LOW (ref >60)
GFR calc non Af Amer: 14 mL/min — ABNORMAL LOW (ref >60)
Glucose, Bld: 64 mg/dL — ABNORMAL LOW (ref 70–99)
Potassium: 3.7 mmol/L (ref 3.5–5.1)
Sodium: 137 mmol/L (ref 135–145)

## 2019-01-26 LAB — CBC
HCT: 35.2 % — ABNORMAL LOW (ref 36.0–46.0)
Hemoglobin: 11.2 g/dL — ABNORMAL LOW (ref 12.0–15.0)
MCH: 23.2 pg — ABNORMAL LOW (ref 26.0–34.0)
MCHC: 31.8 g/dL (ref 30.0–36.0)
MCV: 73 fL — ABNORMAL LOW (ref 80.0–100.0)
Platelets: 265 10*3/uL (ref 150–400)
RBC: 4.82 MIL/uL (ref 3.87–5.11)
RDW: 15.9 % — ABNORMAL HIGH (ref 11.5–15.5)
WBC: 3.7 10*3/uL — ABNORMAL LOW (ref 4.0–10.5)
nRBC: 0 % (ref 0.0–0.2)

## 2019-01-26 LAB — GLUCOSE, CAPILLARY
Glucose-Capillary: 57 mg/dL — ABNORMAL LOW (ref 70–99)
Glucose-Capillary: 67 mg/dL — ABNORMAL LOW (ref 70–99)
Glucose-Capillary: 91 mg/dL (ref 70–99)
Glucose-Capillary: 129 mg/dL — ABNORMAL HIGH (ref 70–99)
Glucose-Capillary: 141 mg/dL — ABNORMAL HIGH (ref 70–99)
Glucose-Capillary: 188 mg/dL — ABNORMAL HIGH (ref 70–99)

## 2019-01-26 LAB — PROTIME-INR
INR: 2 — ABNORMAL HIGH (ref 0.8–1.2)
Prothrombin Time: 22.3 seconds — ABNORMAL HIGH (ref 11.4–15.2)

## 2019-01-26 MED ORDER — WARFARIN SODIUM 2.5 MG PO TABS
2.5000 mg | ORAL_TABLET | Freq: Once | ORAL | Status: AC
  Administered 2019-01-26: 2.5 mg via ORAL
  Filled 2019-01-26: qty 1

## 2019-01-26 MED ORDER — CARVEDILOL 25 MG PO TABS
25.0000 mg | ORAL_TABLET | Freq: Two times a day (BID) | ORAL | Status: DC
  Administered 2019-01-26 – 2019-01-30 (×8): 25 mg via ORAL
  Filled 2019-01-26 (×2): qty 1
  Filled 2019-01-26: qty 2
  Filled 2019-01-26 (×5): qty 1

## 2019-01-26 MED ORDER — INSULIN GLARGINE 100 UNIT/ML ~~LOC~~ SOLN
15.0000 [IU] | Freq: Every day | SUBCUTANEOUS | Status: DC
  Administered 2019-01-26: 10 [IU] via SUBCUTANEOUS
  Filled 2019-01-26 (×2): qty 0.15

## 2019-01-26 NOTE — Plan of Care (Signed)
°  Problem: Education: °Goal: Ability to demonstrate management of disease process will improve °Outcome: Progressing °Goal: Ability to verbalize understanding of medication therapies will improve °Outcome: Progressing °Goal: Individualized Educational Video(s) °Outcome: Progressing °  °

## 2019-01-26 NOTE — Progress Notes (Signed)
Progress Note    Amber Young  NKN:[id number] DOB: [birth year]  DOA: 01/23/2019 PCP: Azzie Glatter, FNP    Brief Narrative:     Medical records reviewed and are as summarized below:  Amber Young is an 63 y.o. female with medical history significant of afib, CHF, CKD, diabetes presents to ED with cc LE edema, htn. Initial workup reveals acute on chronic heart failure, hypertensive urgency.    Assessment/Plan:   Principal Problem:   Acute on chronic combined systolic (congestive) and diastolic (congestive) heart failure (HCC) Active Problems:   Uncontrolled diabetes mellitus (HCC)   Hypokalemia   PAF (paroxysmal atrial fibrillation) (HCC)   CKD (chronic kidney disease), stage IV (HCC)   Hypertensive urgency   Essential hypertension   Acute on chronic heart failure (HCC)   Acute on chronic combined systolic and diastolic heart failure.  Likely multifactorial specifically missing doses of Lasix dietary indiscretion uncontrolled blood pressure.  Echo in December 2019 reveals an EF of 40% severe anteroseptal apical akinesis.  Home medications include Lasix, Coreg, HCTZ.  -monitor on tele -IV lasix at a higher dose== better response -Repeat echo shows diffuse hypokinesis with EF 25-30 -Cardiology consult appreciated -Daily weights -Intake and output-- down 1.6L  Hypertensive urgency.  -  Home medications include hydralazine, Lasix, Coreg, Apresoline, hydrochlorothiazide, Norvasc.   -Resume home meds -Continue IV Lasix as noted above -PRN hydralazine  Paroxysmal A. fib.   -chadvasc score 4.  -continue home meds including warfarin   Hypokalemia.  Likely related to 6.  Potassium level 3.0 on admission -Replete -replete magnesium   Diabetes.  Home medications include Lantus and oral agents. -Continue home Lantus but lower dose due to low fasting sugar this AM -holding Oral agents as appetite unreliable -Sliding scale insulin for optimal control -diabetic  coordinator  Chronic kidney disease/nephrotic syndrome -needs outpatient nephrology follow up- has never seen nephrology -low albumin -3+ protein in urine -needs better blood sugar control as well as BP -was on losartan until Dec 2019-- held up on d/c for worsening kidney failure and hypotension-- but this was never re-started -if CR not improving by AM, will get nephrology consult   Family Communication/Anticipated D/C date and plan/Code Status   DVT prophylaxis: coumadin Code Status: Full Code.  Family Communication:  Disposition Plan: needs continued diuresis and close labs monitoring-- will need ARB started when diuresis complete   Medical Consultants:    cards     Subjective:   Feels like her swelling in her legs has improved  Objective:    Vitals:   01/26/19 0021 01/26/19 0118 01/26/19 0437 01/26/19 0846  BP: (!) 160/84  (!) 158/80 (!) 158/69  Pulse: 70  70 62  Resp: 20  20 16   Temp: 97.8 F (36.6 C)  98 F (36.7 C) (!) 97.4 F (36.3 C)  TempSrc: Oral  Oral Oral  SpO2: 99%  100% 100%  Weight:  88.5 kg    Height:        Intake/Output Summary (Last 24 hours) at 01/26/2019 1354 Last data filed at 01/26/2019 1115 Gross per 24 hour  Intake 960 ml  Output 2750 ml  Net -1790 ml   Filed Weights   01/24/19 0400 01/25/19 0119 01/26/19 0118  Weight: 86.9 kg 88.5 kg 88.5 kg    Exam: In chair, not able to lay flat at night yet TED hose on  Legs softer +BS, soft, NT alert   Data Reviewed:   I have personally reviewed following  labs and imaging studies:  Labs: Labs show the following:   Basic Metabolic Panel: Recent Labs  Lab 01/23/19 1248 01/23/19 1651 01/24/19 0535 01/25/19 0635 01/26/19 0417  NA 138  --  138 138 137  K 3.0*  --  3.6 3.4* 3.7  CL 106  --  106 107 104  CO2 21*  --  23 23 24   GLUCOSE 176*  --  92 75 64*  BUN 26*  --  25* 28* 33*  CREATININE 2.58*  --  2.80* 2.95* 3.39*  CALCIUM 8.1*  --  8.0* 8.3* 8.6*  MG  --  1.6*   --   --   --    GFR Estimated Creatinine Clearance: 19.3 mL/min (A) (by C-G formula based on SCr of 3.39 mg/dL (H)). Liver Function Tests: Recent Labs  Lab 01/24/19 0624  AST 19  ALT 14  ALKPHOS 67  BILITOT 0.4  PROT 4.9*  ALBUMIN 2.0*   No results for input(s): LIPASE, AMYLASE in the last 168 hours. No results for input(s): AMMONIA in the last 168 hours. Coagulation profile Recent Labs  Lab 01/23/19 1248 01/24/19 0535 01/25/19 0635 01/26/19 0417  INR 1.1 1.1 1.4* 2.0*    CBC: Recent Labs  Lab 01/23/19 1248 01/25/19 0635 01/26/19 0417  WBC 4.4 3.8* 3.7*  HGB 11.1* 10.8* 11.2*  HCT 35.8* 33.9* 35.2*  MCV 75.1* 73.1* 73.0*  PLT 260 264 265   Cardiac Enzymes: Recent Labs  Lab 01/23/19 1248  TROPONINI 0.03*   BNP (last 3 results) No results for input(s): PROBNP in the last 8760 hours. CBG: Recent Labs  Lab 01/25/19 2059 01/26/19 0617 01/26/19 0653 01/26/19 0804 01/26/19 1113  GLUCAP 240* 57* 67* 91 129*   D-Dimer: No results for input(s): DDIMER in the last 72 hours. Hgb A1c: Recent Labs    01/23/19 1651  HGBA1C 10.9*   Lipid Profile: No results for input(s): CHOL, HDL, LDLCALC, TRIG, CHOLHDL, LDLDIRECT in the last 72 hours. Thyroid function studies: No results for input(s): TSH, T4TOTAL, T3FREE, THYROIDAB in the last 72 hours.  Invalid input(s): FREET3 Anemia work up: No results for input(s): VITAMINB12, FOLATE, FERRITIN, TIBC, IRON, RETICCTPCT in the last 72 hours. Sepsis Labs: Recent Labs  Lab 01/23/19 1248 01/25/19 0635 01/26/19 0417  WBC 4.4 3.8* 3.7*    Microbiology Recent Results (from the past 240 hour(s))  Urine Culture     Status: None   Collection Time: 01/23/19 10:53 AM   Specimen: Urine   UR  Result Value Ref Range Status   Urine Culture, Routine Final report  Final   Organism ID, Bacteria Comment  Final    Comment: Mixed urogenital flora 10,000-25,000 colony forming units per mL   Novel Coronavirus,NAA,(SEND-OUT  TO REF LAB - TAT 24-48 hrs); Hosp Order     Status: None   Collection Time: 01/23/19  2:54 PM   Specimen: Nasopharyngeal Swab; Respiratory  Result Value Ref Range Status   SARS-CoV-2, NAA NOT DETECTED NOT DETECTED Final    Comment: (NOTE) This test was developed and its performance characteristics determined by [employer]. This test has not been FDA cleared or approved. This test has been authorized by FDA under an Emergency Use Authorization (EUA). This test is only authorized for the duration of time the declaration that circumstances exist justifying the authorization of the emergency use of in vitro diagnostic tests for detection of SARS-CoV-2 virus and/or diagnosis of COVID-19 infection under section 564(b)(1) of the Act, 21 U.S.C. 094BSJ-6(G)(8), unless  the authorization is terminated or revoked sooner. When diagnostic testing is negative, the possibility of a false negative result should be considered in the context of a patient's recent exposures and the presence of clinical signs and symptoms consistent with COVID-19. An individual without symptoms of COVID-19 and who is not shedding SARS-CoV-2 virus would expect to have a negative (not detected) result in this assay. Performed  At: [facility] [address] [place], [place] [id number] Rush Farmer MD HC:[id number]    Kevin  Final    Comment: Performed at [facility], [place] [address] [place], [place] [address]    Procedures and diagnostic studies:  No results found.  Medications:   . atorvastatin  40 mg Oral q1800  . brimonidine  1 drop Left Eye BID  . carvedilol  25 mg Oral BID  . dorzolamide-timolol  1 drop Left Eye BID  . ferrous sulfate  325 mg Oral QODAY  . furosemide  80 mg Intravenous BID  . hydrALAZINE  100 mg Oral TID  . insulin aspart  0-5 Units Subcutaneous QHS  . insulin aspart  0-9 Units Subcutaneous TID WC  . insulin glargine  15 Units  Subcutaneous QHS  . potassium chloride  10 mEq Oral BID  . sodium chloride flush  3 mL Intravenous Q12H  . ticagrelor  90 mg Oral BID  . warfarin  2.5 mg Oral ONCE-1800  . Warfarin - Pharmacist Dosing Inpatient   Does not apply q1800   Continuous Infusions: . sodium chloride       LOS: 2 days   Geradine Girt  [facility]   How to contact the [facility] Attending or Consulting provider [place] or covering provider during after hours [place], for this patient?  1. Check the care team in [facility] and look for a) attending/consulting [facility] provider listed and b) the [facility] team listed 2. Log into [email or web address] and use [place]'s universal password to access. If you do not have the password, please contact the hospital operator. 3. Locate the [facility] provider you are looking for under [facility] and page to a number that you can be directly reached. 4. If you still have difficulty reaching the provider, please page the [facility] (Director on Call) for the Hospitalists listed on amion for assistance.  01/26/2019, 1:54 PM

## 2019-01-26 NOTE — Progress Notes (Signed)
Progress Note  Patient Name: Amber Young Date of Encounter: 01/26/2019  Primary Cardiologist: Kate Sable, MD   Subjective   Pt comfortable in chair  Deneis CP  No SOB at rest    Inpatient Medications    Scheduled Meds: . atorvastatin  40 mg Oral q1800  . brimonidine  1 drop Left Eye BID  . carvedilol  12.5 mg Oral BID  . dorzolamide-timolol  1 drop Left Eye BID  . ferrous sulfate  325 mg Oral QODAY  . furosemide  80 mg Intravenous BID  . hydrALAZINE  100 mg Oral TID  . insulin aspart  0-5 Units Subcutaneous QHS  . insulin aspart  0-9 Units Subcutaneous TID WC  . insulin glargine  20 Units Subcutaneous QHS  . potassium chloride  10 mEq Oral BID  . sodium chloride flush  3 mL Intravenous Q12H  . ticagrelor  90 mg Oral BID  . Warfarin - Pharmacist Dosing Inpatient   Does not apply q1800   Continuous Infusions: . sodium chloride     PRN Meds: sodium chloride, acetaminophen, ALPRAZolam, hydrALAZINE, nitroGLYCERIN, ondansetron (ZOFRAN) IV, sodium chloride flush   Vital Signs    Vitals:   01/25/19 1938 01/26/19 0021 01/26/19 0118 01/26/19 0437  BP: (!) 151/73 (!) 160/84  (!) 158/80  Pulse: 63 70  70  Resp: 20 20  20   Temp: 97.7 F (36.5 C) 97.8 F (36.6 C)  98 F (36.7 C)  TempSrc: Oral Oral  Oral  SpO2: 100% 99%  100%  Weight:   88.5 kg   Height:        Intake/Output Summary (Last 24 hours) at 01/26/2019 0820 Last data filed at 01/26/2019 0700 Gross per 24 hour  Intake 1318 ml  Output 2050 ml  Net -732 ml   Filed Weights   01/24/19 0400 01/25/19 0119 01/26/19 0118  Weight: 86.9 kg 88.5 kg 88.5 kg    Telemetry    SR - Personally Reviewed  Physical Exam   Physical exam per MD:  GEN: No acute distress.   Neck:  JVP is increased   Cardiac:  RRR, no murmurs, rubs, or gallops.  Respiratory: Decreased BS at bases GI: NABS, Soft, nontender, non-distended  MS: 1+ edema; No deformity. Neuro:  Nonfocal, moving all extremities spontaneously  Psych: Normal affect   Labs    Chemistry Recent Labs  Lab 01/24/19 0535 01/24/19 0624 01/25/19 0635 01/26/19 0417  NA 138  --  138 137  K 3.6  --  3.4* 3.7  CL 106  --  107 104  CO2 23  --  23 24  GLUCOSE 92  --  75 64*  BUN 25*  --  28* 33*  CREATININE 2.80*  --  2.95* 3.39*  CALCIUM 8.0*  --  8.3* 8.6*  PROT  --  4.9*  --   --   ALBUMIN  --  2.0*  --   --   AST  --  19  --   --   ALT  --  14  --   --   ALKPHOS  --  67  --   --   BILITOT  --  0.4  --   --   GFRNONAA 17*  --  16* 14*  GFRAA 20*  --  19* 16*  ANIONGAP 9  --  8 9     Hematology Recent Labs  Lab 01/23/19 1248 01/25/19 0635 01/26/19 0417  WBC 4.4 3.8* 3.7*  RBC 4.77  4.64 4.82  HGB 11.1* 10.8* 11.2*  HCT 35.8* 33.9* 35.2*  MCV 75.1* 73.1* 73.0*  MCH 23.3* 23.3* 23.2*  MCHC 31.0 31.9 31.8  RDW 15.4 15.9* 15.9*  PLT 260 264 265    Cardiac Enzymes Recent Labs  Lab 01/23/19 1248  TROPONINI 0.03*   No results for input(s): TROPIPOC in the last 168 hours.   BNP Recent Labs  Lab 01/23/19 1715  BNP 2,666.4*     DDimer No results for input(s): DDIMER in the last 168 hours.   Radiology    No results found.  Cardiac Studies   Echocardiogram 01/24/2019: IMPRESSIONS   1. The left ventricle has severely reduced systolic function, with an ejection fraction of 25-30%. The cavity size was normal. There is mildly increased left ventricular wall thickness. Left ventricular diastolic Doppler parameters are consistent with  pseudonormalization. Left ventricular diffuse hypokinesis. 2. The right ventricle has mildly reduced systolic function. The cavity was mildly enlarged. Right ventricular systolic pressure is moderately elevated with an estimated pressure of 43.3 mmHg. 3. Left atrial size was moderately dilated. 4. Right atrial size was mildly dilated. 5. The mitral valve is grossly normal. Mild thickening of the mitral valve leaflet. There is mild mitral annular calcification present.  6. The tricuspid valve is grossly normal. Tricuspid valve regurgitation is moderate. 7. The aortic valve is tricuspid. Mild thickening of the aortic valve. No stenosis of the aortic valve. 8. Global hypokinesis with akinesis of the mid septum; overall severe LV dysfunction; mild LVH; moderate diastolic dysfunction; mild MR; biatrial enlargement; mild RVE with mild RV dysfunction; moderate TR; moderate pulmonary hypertension.  ECHO: 07/21/2018 - Left ventricle: The cavity size was normal. Wall thickness wasincreased in a pattern of mild LVH. Systolic function was mildlyto moderately reduced. The estimated ejection fraction was in therange of 40% to 45%. Severe anteroseptal apical and inferoapicalakinesis. No apical thrombus was noted. The study is nottechnically sufficient to allow evaluation of LV diastolicfunction. - Mitral valve: Mildly thickened leaflets . There was moderateregurgitation. - Left atrium: Moderately dilated. - Right ventricle: The cavity size was mildly dilated. Mildsystolic dysfunction. - Right atrium: The atrium was at the upper limits of normal insize. - Tricuspid valve: There was mild regurgitation. - Pulmonary arteries: PA peak pressure: 29 mm Hg (S). - Inferior vena cava: The vessel was dilated. The respirophasicdiameter changes were blunted (<50%), consistent with elevatedcentral venous pressure.  Impressions:  - Compared to a prior study in 08/2017, there are no significantchanges.  CATH: 08/2017  Prox RCA lesion is 75% stenosed.  A drug-eluting stent was successfully placed using a STENT PROMUS PREM MR 2.75X16.  Post intervention, there is a 5% residual stenosis.  Ost RCA lesion is 85% stenosed.  A drug-eluting stent was successfully placed using a STENT PROMUS PREM MR 3.0X12. Overlaps Stent 1 proximally.  Post intervention, there is a 0% residual stenosis.  Mid RCA-1 lesion is 30% stenosed. Mid RCA-2 lesion is 25% stenosed.  Ost 1st  Diag lesion is 30% stenosed. Ost 2nd Diag to 2nd Diag lesion is 80% stenosed. Stable.  Prox LAD lesion is 40% stenosed. Previously placed Mid LAD Promus drug eluting stent is widely patent.  There is mild left ventricular systolic dysfunction. The left ventricular ejection fraction is 45-50% by visual estimate.  LV end diastolic pressure is mildly elevated.  Severe Ostial &proximal RCA stenosis (progression of disease from Oct 2017).  Successful PCI using 2 overlapping DES. Difficult, ostial lesion PCI requiring multiple balloons, buddy wire &extra support  wire.  Plan:   Overnight monitoring in the posterior unit (6C)  TR band removal per protocol  Continue DAPT -at least 1 more year\  Continue aggressive risk factor modification.  Patient Profile     63 y.o.femalewith a hx of HTN, DM, HLD, CVA, GERD,anemia, DES x2 RCA 1/29/2019w/ med rx for 40% LAD, prev LAD stent ok, EF 40-45% by echo 08/2017 w/ grade 3 dd, S-D-CHF, PAF on coumadin, who is being followed by cardiology for CHF.   Assessment & Plan    1. Acute on chronic combined CHF: patient presented with DOE and LE edema c/w CHF exacerbation possibly 2/2 medication non-compliance and dietary indiscretion. Echo this admission with drop in EF from 40-45% to 25-30% with G2DD and diffuse hypokinesis of the LV. Drop in EF possibly related to poorly controlled HTN vs ischemia. She is diuresing with IV lasix (dose increased 6/17) with minimal mprovement in UOP: net -732 in the past 24 hours with -968mL this admission. Weight stagnant at 195lbs. Unfortunately Cr is uptrending to 3.39 today - Continue IV lasix - Continue carvedilol and hydralazine - Unable to add ACEi/ARB/Entresto/Spironolactone with CKD - Continue to monitor strict I&Os and daily weights - Continue to monitor electrolytes to monitor electrolytes closely with diuresis  2. CAD s/p PCI/DES to RCA and ostial 08/2017: No chest pain complaints. EKG non-ischemic and  trop 0.03. Echo with drop in EF to 25-30%.  - Continue aspirin, brilinta, and statin   3. HTN: BP poorly controlled. Amlodipine stopped given drop in EF and hydralazine increased to 100mg  TID 01/25/2019.  - Will increase carvedilol to 25mg  BID, though may be limited by HR - will monitor response closely - Continue hydralazine 100mg  TID  4. CKD stage IV: Cr up to 3.39 today from 2.95 yesterday; baseline 2.5. - WOuld contact renal service for assistance        For questions or updates, please contact [place] Please consult [email or web address] for contact info under Cardiology/STEMI.      Signed, Abigail Butts, PA-C  01/26/2019, 8:20 AM     Pt seen and examined  I have amended note above by K Kroeger and addped PE (since she is remote) Pt has not had signif diuresis to lasix    Would recomm consulting nephrology for assistance given bump in Cr   Will continue to follow  Dorris Carnes MD

## 2019-01-26 NOTE — Plan of Care (Signed)
  Problem: Activity: Goal: Capacity to carry out activities will improve Outcome: Progressing   Problem: Clinical Measurements: Goal: Ability to maintain clinical measurements within normal limits will improve Outcome: Progressing Goal: Respiratory complications will improve Outcome: Progressing Goal: Cardiovascular complication will be avoided Outcome: Progressing   Problem: Activity: Goal: Risk for activity intolerance will decrease Outcome: Progressing   Problem: Nutrition: Goal: Adequate nutrition will be maintained Outcome: Progressing   Problem: Coping: Goal: Level of anxiety will decrease Outcome: Progressing   Problem: Elimination: Goal: Will not experience complications related to urinary retention Outcome: Progressing   Problem: Pain Managment: Goal: General experience of comfort will improve Outcome: Progressing   Problem: Safety: Goal: Ability to remain free from injury will improve Outcome: Progressing

## 2019-01-26 NOTE — Progress Notes (Signed)
Patient oob to chair sitting at bedside without c/o's. She had some low blood sugar readings last pm and early this am required nursing to hold her insulin till her levels evened out,the MD has made adjustments to her Lantus accordingly will continue to observe for improvement. Her appetite is good and she has been eating her meals well. No other problems noted thus far this shift.

## 2019-01-26 NOTE — Progress Notes (Signed)
[place] for warfarin Indication: atrial fibrillation  Allergies  Allergen Reactions  . Ace Inhibitors Cough    Patient Measurements: Height: 5\' 6"  (167.6 cm) Weight: 195 lb (88.5 kg) IBW/kg (Calculated) : 59.3  Vital Signs: Temp: 97.4 F (36.3 C) (06/18 0846) Temp Source: Oral (06/18 0846) BP: 158/69 (06/18 0846) Pulse Rate: 62 (06/18 0846)  Labs: Recent Labs    01/24/19 0535 01/25/19 0635 01/26/19 0417  HGB  --  10.8* 11.2*  HCT  --  33.9* 35.2*  PLT  --  264 265  LABPROT 14.4 17.4* 22.3*  INR 1.1 1.4* 2.0*  CREATININE 2.80* 2.95* 3.39*    Estimated Creatinine Clearance: 19.3 mL/min (A) (by C-G formula based on SCr of 3.39 mg/dL (H)).   Medical History: Past Medical History:  Diagnosis Date  . Anemia   . Chronic combined systolic and diastolic CHF (congestive heart failure) ([place])   . CKD (chronic kidney disease), stage IV ([place])   . Coronary artery disease 08/2017   DES x 2 RCA  . GERD (gastroesophageal reflux disease)   . Heart murmur   . Hyperlipidemia   . Hypertension   . 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 Stroke ([place]) 09/2014   numbness left upper lip, finger tips on left hand; "resolved" (05/18/2016)  . Type II diabetes mellitus ([place])   . Wears glasses     Assessment: 23 yof presented to the ED with hypertension and dizziness. She is on chronic warfarin for history of afib. She also had recent cataract surgery. INR was subtherapeutic on admission. -INR up to 2.0 (daily trend 1.1>>1.4>>2.0); likely in setting of HF  PTA dosing 5mg  daily  Goal of Therapy:  INR 2-3 Monitor platelets by anticoagulation protocol: Yes   Plan:  -Warfarin 2.5mg  today -Daily PT/INR  Hildred Laser, PharmD Clinical Pharmacist **Pharmacist phone directory can now be found on [email or web address] ([email or web address]).  Listed under [place].

## 2019-01-27 LAB — MAGNESIUM: Magnesium: 1.9 mg/dL (ref 1.7–2.4)

## 2019-01-27 LAB — BASIC METABOLIC PANEL
Anion gap: 12 (ref 5–15)
BUN: 35 mg/dL — ABNORMAL HIGH (ref 8–23)
CO2: 21 mmol/L — ABNORMAL LOW (ref 22–32)
Calcium: 8.3 mg/dL — ABNORMAL LOW (ref 8.9–10.3)
Chloride: 104 mmol/L (ref 98–111)
Creatinine, Ser: 3.52 mg/dL — ABNORMAL HIGH (ref 0.44–1.00)
GFR calc Af Amer: 15 mL/min — ABNORMAL LOW (ref >60)
GFR calc non Af Amer: 13 mL/min — ABNORMAL LOW (ref >60)
Glucose, Bld: 84 mg/dL (ref 70–99)
Potassium: 3.7 mmol/L (ref 3.5–5.1)
Sodium: 137 mmol/L (ref 135–145)

## 2019-01-27 LAB — FERRITIN: Ferritin: 23 ng/mL (ref 11–307)

## 2019-01-27 LAB — PROTIME-INR
INR: 2.3 — ABNORMAL HIGH (ref 0.8–1.2)
Prothrombin Time: 24.8 seconds — ABNORMAL HIGH (ref 11.4–15.2)

## 2019-01-27 LAB — IRON AND TIBC
Iron: 19 ug/dL — ABNORMAL LOW (ref 28–170)
Saturation Ratios: 7 % — ABNORMAL LOW (ref 10.4–31.8)
TIBC: 273 ug/dL (ref 250–450)
UIBC: 254 ug/dL

## 2019-01-27 LAB — GLUCOSE, CAPILLARY
Glucose-Capillary: 106 mg/dL — ABNORMAL HIGH (ref 70–99)
Glucose-Capillary: 83 mg/dL (ref 70–99)
Glucose-Capillary: 151 mg/dL — ABNORMAL HIGH (ref 70–99)
Glucose-Capillary: 147 mg/dL — ABNORMAL HIGH (ref 70–99)

## 2019-01-27 MED ORDER — INSULIN GLARGINE 100 UNIT/ML ~~LOC~~ SOLN
10.0000 [IU] | Freq: Every day | SUBCUTANEOUS | Status: DC
  Administered 2019-01-27: 22:00:00 10 [IU] via SUBCUTANEOUS
  Filled 2019-01-27 (×2): qty 0.1

## 2019-01-27 MED ORDER — POLYETHYLENE GLYCOL 3350 17 G PO PACK
17.0000 g | PACK | Freq: Once | ORAL | Status: AC
  Administered 2019-01-27: 17 g via ORAL
  Filled 2019-01-27: qty 1

## 2019-01-27 MED ORDER — WARFARIN SODIUM 2.5 MG PO TABS
2.5000 mg | ORAL_TABLET | Freq: Once | ORAL | Status: AC
  Administered 2019-01-27: 2.5 mg via ORAL
  Filled 2019-01-27: qty 1

## 2019-01-27 MED ORDER — ISOSORBIDE DINITRATE 10 MG PO TABS
20.0000 mg | ORAL_TABLET | Freq: Three times a day (TID) | ORAL | Status: DC
  Administered 2019-01-27 – 2019-01-30 (×10): 20 mg via ORAL
  Filled 2019-01-27 (×11): qty 2

## 2019-01-27 NOTE — Progress Notes (Addendum)
Progress Note  Patient Name: Amber Young Date of Encounter: 01/27/2019  Primary Cardiologist: Kate Sable, MD   Subjective   No SOB at rest  No CP    Inpatient Medications    Scheduled Meds: . atorvastatin  40 mg Oral q1800  . brimonidine  1 drop Left Eye BID  . carvedilol  25 mg Oral BID  . dorzolamide-timolol  1 drop Left Eye BID  . ferrous sulfate  325 mg Oral QODAY  . furosemide  80 mg Intravenous BID  . hydrALAZINE  100 mg Oral TID  . insulin aspart  0-5 Units Subcutaneous QHS  . insulin aspart  0-9 Units Subcutaneous TID WC  . insulin glargine  15 Units Subcutaneous QHS  . potassium chloride  10 mEq Oral BID  . sodium chloride flush  3 mL Intravenous Q12H  . ticagrelor  90 mg Oral BID  . Warfarin - Pharmacist Dosing Inpatient   Does not apply q1800   Continuous Infusions: . sodium chloride     PRN Meds: sodium chloride, acetaminophen, ALPRAZolam, hydrALAZINE, nitroGLYCERIN, ondansetron (ZOFRAN) IV, sodium chloride flush   Vital Signs    Vitals:   01/26/19 0846 01/26/19 2258 01/27/19 0116 01/27/19 0559  BP: (!) 158/69 (!) 178/85  (!) 176/81  Pulse: 62 82  72  Resp: 16 18  18   Temp: (!) 97.4 F (36.3 C) 98.2 F (36.8 C)  98.2 F (36.8 C)  TempSrc: Oral Oral  Oral  SpO2: 100% 98%  95%  Weight:   86.8 kg   Height:        Intake/Output Summary (Last 24 hours) at 01/27/2019 0847 Last data filed at 01/27/2019 0700 Gross per 24 hour  Intake 100 ml  Output 1750 ml  Net -1650 ml   Filed Weights   01/25/19 0119 01/26/19 0118 01/27/19 0116  Weight: 88.5 kg 88.5 kg 86.8 kg    Telemetry    SR   - Personally Reviewed  Physical Exam   Physical exam per MD:  GEN: No acute distress.   Neck: JVP is increased , no carotid bruits Cardiac: RRR, no murmurs, rubs, or gallops.  Respiratory: Clear to auscultation bilaterally, no wheezes/ rales/ rhonchi GI: NABS, Soft, nontender, non-distended  MS: 1-2+ edema; No deformity. Neuro:  Nonfocal, moving  all extremities spontaneously Psych: Normal affect   Labs    Chemistry Recent Labs  Lab 01/24/19 0624 01/25/19 0635 01/26/19 0417 01/27/19 0648  NA  --  138 137 137  K  --  3.4* 3.7 3.7  CL  --  107 104 104  CO2  --  23 24 21*  GLUCOSE  --  75 64* 84  BUN  --  28* 33* 35*  CREATININE  --  2.95* 3.39* 3.52*  CALCIUM  --  8.3* 8.6* 8.3*  PROT 4.9*  --   --   --   ALBUMIN 2.0*  --   --   --   AST 19  --   --   --   ALT 14  --   --   --   ALKPHOS 67  --   --   --   BILITOT 0.4  --   --   --   GFRNONAA  --  16* 14* 13*  GFRAA  --  19* 16* 15*  ANIONGAP  --  8 9 12      Hematology Recent Labs  Lab 01/23/19 1248 01/25/19 0635 01/26/19 0417  WBC 4.4 3.8* 3.7*  RBC 4.77 4.64 4.82  HGB 11.1* 10.8* 11.2*  HCT 35.8* 33.9* 35.2*  MCV 75.1* 73.1* 73.0*  MCH 23.3* 23.3* 23.2*  MCHC 31.0 31.9 31.8  RDW 15.4 15.9* 15.9*  PLT 260 264 265    Cardiac Enzymes Recent Labs  Lab 01/23/19 1248  TROPONINI 0.03*   No results for input(s): TROPIPOC in the last 168 hours.   BNP Recent Labs  Lab 01/23/19 1715  BNP 2,666.4*     DDimer No results for input(s): DDIMER in the last 168 hours.   Radiology    No results found.  Cardiac Studies   Echocardiogram 01/24/2019: IMPRESSIONS   1. The left ventricle has severely reduced systolic function, with an ejection fraction of 25-30%. The cavity size was normal. There is mildly increased left ventricular wall thickness. Left ventricular diastolic Doppler parameters are consistent with  pseudonormalization. Left ventricular diffuse hypokinesis. 2. The right ventricle has mildly reduced systolic function. The cavity was mildly enlarged. Right ventricular systolic pressure is moderately elevated with an estimated pressure of 43.3 mmHg. 3. Left atrial size was moderately dilated. 4. Right atrial size was mildly dilated. 5. The mitral valve is grossly normal. Mild thickening of the mitral valve leaflet. There is mild mitral  annular calcification present. 6. The tricuspid valve is grossly normal. Tricuspid valve regurgitation is moderate. 7. The aortic valve is tricuspid. Mild thickening of the aortic valve. No stenosis of the aortic valve. 8. Global hypokinesis with akinesis of the mid septum; overall severe LV dysfunction; mild LVH; moderate diastolic dysfunction; mild MR; biatrial enlargement; mild RVE with mild RV dysfunction; moderate TR; moderate pulmonary hypertension.  ECHO: 07/21/2018 - Left ventricle: The cavity size was normal. Wall thickness wasincreased in a pattern of mild LVH. Systolic function was mildlyto moderately reduced. The estimated ejection fraction was in therange of 40% to 45%. Severe anteroseptal apical and inferoapicalakinesis. No apical thrombus was noted. The study is nottechnically sufficient to allow evaluation of LV diastolicfunction. - Mitral valve: Mildly thickened leaflets . There was moderateregurgitation. - Left atrium: Moderately dilated. - Right ventricle: The cavity size was mildly dilated. Mildsystolic dysfunction. - Right atrium: The atrium was at the upper limits of normal insize. - Tricuspid valve: There was mild regurgitation. - Pulmonary arteries: PA peak pressure: 29 mm Hg (S). - Inferior vena cava: The vessel was dilated. The respirophasicdiameter changes were blunted (<50%), consistent with elevatedcentral venous pressure.  Impressions:  - Compared to a prior study in 08/2017, there are no significantchanges.  CATH:08/2017  Prox RCA lesion is 75% stenosed.  A drug-eluting stent was successfully placed using a STENT PROMUS PREM MR 2.75X16.  Post intervention, there is a 5% residual stenosis.  Ost RCA lesion is 85% stenosed.  A drug-eluting stent was successfully placed using a STENT PROMUS PREM MR 3.0X12. Overlaps Stent 1 proximally.  Post intervention, there is a 0% residual stenosis.  Mid RCA-1 lesion is 30% stenosed. Mid RCA-2 lesion  is 25% stenosed.  Ost 1st Diag lesion is 30% stenosed. Ost 2nd Diag to 2nd Diag lesion is 80% stenosed. Stable.  Prox LAD lesion is 40% stenosed. Previously placed Mid LAD Promus drug eluting stent is widely patent.  There is mild left ventricular systolic dysfunction. The left ventricular ejection fraction is 45-50% by visual estimate.  LV end diastolic pressure is mildly elevated.  Severe Ostial &proximal RCA stenosis (progression of disease from Oct 2017).  Successful PCI using 2 overlapping DES. Difficult, ostial lesion PCI requiring multiple balloons, buddy wire &extra  support wire.  Plan:   Overnight monitoring in the posterior unit (6C)  TR band removal per protocol  Continue DAPT -at least 1 more year\  Continue aggressive risk factor modification.  Patient Profile    63 y.o.femalewith a hx of HTN, DM, HLD, CVA, GERD,anemia, DES x2 RCA 1/29/2019w/ med rx for 40% LAD, prev LAD stent ok, EF 40-45% by echo 08/2017 w/ grade 3 dd, S-D-CHF, PAF on coumadin, who is being followed by cardiology for CHF.  Assessment & Plan    1. Acute on chronic combined CHF: patient presented with DOE and LE edema c/w CHF exacerbation, felt to be 2/2 medication non-compliance and dietary indiscretion. Echo this admission with drop in EF from 40-45% to 25-30% with G2DD and diffuse hypokinesis of the LV. Drop in EF possible 2/2 poorly controlled HTN vs ischemia. Diuresing with IV lasix with UOP net -1.6L in the past 24 hours and -2.6L this admission. Weight down to 191lbs today from 195lbs yesterday. Diuresis has been limited by AoCKD with Cr uptrending to 3.52 today. - Recommend nephrology consult to assist with diuresis - Continue IV lasix - Continue carvedilol and hydralazine - Unable to add ACEi/ARB/Entresto/Spironolactone with CKD - Will add isordil today to optimize CHF/HTN management - Continue to monitor strict I&Os and daily weights - Continue to monitor electrolytes closely with  diuresis   2. CAD s/p PCI/DES to RCA and ostial 08/2017: no anginal complaints. EKG non-ischemic and trop negative. Echo with drop in EF to 25-30%. Not on ASA given need for coumadin - Continue brilinta and  statin  3. HTN: BP remains poorly controlled after stopping amlodipine given drop in EF. Hydralazine increased to 100mg  TID. Carvedilol increased to 25mg  BID.  - Will add isordil today to optimize CHF/HTN - Continue hydralazine and carvedilol Add back low dose amlodiione and follow  BP should improve some when flud improves   4. Acute on CKD stage IV: Cr up to 3.52 today, baseline 2.5. Limiting diuresis - Recommend nephrology consult to assist with diuresis  5. PAF: maintaining sinus rhythm.  - Continue carvedilol for rate control - Continue coumadin per pharmacy for stroke ppx.   For questions or updates, please contact [place] Please consult [email or web address] for contact info under Cardiology/STEMI.      Signed, Abigail Butts, PA-C  01/27/2019, 8:47 AM   [phone]   Pt seen and examined (exam in above note) as pt's Hx reviewed y K Kroeger remotely Pt remains volme overloaded and hypertensive On exam: Neck iwht increased JVP  Lungs are relatively clear  Cardiac RRR  No S3   Ext are with 2+ edema    Adjustments in meds as noted above for BP Wait input from renal service  Dorris Carnes MD

## 2019-01-27 NOTE — Progress Notes (Signed)
Progress Note    Amber Young  WIO:[id number] DOB: 12/09/1955  DOA: 01/23/2019 PCP: Azzie Glatter, FNP    Brief Narrative:     Medical records reviewed and are as summarized below:  Amber Young is an 63 y.o. female with medical history significant of afib, CHF, CKD, diabetes presents to ED with cc LE edema, htn. Initial workup reveals acute on chronic heart failure, hypertensive urgency.    Assessment/Plan:   Principal Problem:   Acute on chronic combined systolic (congestive) and diastolic (congestive) heart failure (HCC) Active Problems:   Uncontrolled diabetes mellitus (HCC)   Hypokalemia   PAF (paroxysmal atrial fibrillation) (HCC)   CKD (chronic kidney disease), stage IV (HCC)   Hypertensive urgency   Essential hypertension   Acute on chronic heart failure (HCC)   Acute on chronic combined systolic and diastolic heart failure.  Likely multifactorial specifically missing doses of Lasix dietary indiscretion uncontrolled blood pressure.  Echo in December 2019 reveals an EF of 40% severe anteroseptal apical akinesis.  Home medications include Lasix, Coreg, HCTZ.  -monitor on tele -IV lasix at a higher dose== better response -Repeat echo shows diffuse hypokinesis with EF 25-30 -Cardiology consult appreciated -Daily weights -Intake and output-- down 2.5L  Hypertensive urgency.  -  Home medications include hydralazine, Lasix, Coreg, Apresoline, hydrochlorothiazide, Norvasc.   -Resume home meds -Continue IV Lasix as noted above -PRN hydralazine  Paroxysmal A. fib.   -chadvasc score 4.  -continue home meds including warfarin   Hypokalemia.  Likely related to 6.  Potassium level 3.0 on admission -Replete -replete magnesium   Uncontrolled Diabetes type 2 with hyperglycemia.  Home medications include Lantus and oral agents. -Continue home Lantus but lower dose due to low fasting sugar this AM -holding Oral agents as appetite unreliable -Sliding scale  insulin for optimal control -diabetic coordinator consult  Chronic kidney disease/nephrotic syndrome -needs outpatient nephrology follow up- has never seen nephrology -low albumin -3+ protein in urine -needs better blood sugar control as well as BP -was on losartan until Dec 2019-- held up on d/c for worsening kidney failure and hypotension-- but this was never re-started -nephrology consult appreciated   Family Communication/Anticipated D/C date and plan/Code Status   DVT prophylaxis: coumadin Code Status: Full Code.  Family Communication:  Disposition Plan: needs continued diuresis and close labs monitoring-- will need ARB started when diuresis complete   Medical Consultants:    Cards  renal     Subjective:   Asking about going home  Objective:    Vitals:   01/26/19 0846 01/26/19 2258 01/27/19 0116 01/27/19 0559  BP: (!) 158/69 (!) 178/85  (!) 176/81  Pulse: 62 82  72  Resp: 16 18  18   Temp: (!) 97.4 F (36.3 C) 98.2 F (36.8 C)  98.2 F (36.8 C)  TempSrc: Oral Oral  Oral  SpO2: 100% 98%  95%  Weight:   86.8 kg   Height:        Intake/Output Summary (Last 24 hours) at 01/27/2019 1325 Last data filed at 01/27/2019 0700 Gross per 24 hour  Intake 100 ml  Output 1050 ml  Net -950 ml   Filed Weights   01/25/19 0119 01/26/19 0118 01/27/19 0116  Weight: 88.5 kg 88.5 kg 86.8 kg    Exam: In chair- eating lunch rrr TED hose on- less edema in B/L LE A+Ox3  Data Reviewed:   I have personally reviewed following labs and imaging studies:  Labs: Labs show the following:  Basic Metabolic Panel: Recent Labs  Lab 01/23/19 1248 01/23/19 1651 01/24/19 0535 01/25/19 0635 01/26/19 0417 01/27/19 0648  NA 138  --  138 138 137 137  K 3.0*  --  3.6 3.4* 3.7 3.7  CL 106  --  106 107 104 104  CO2 21*  --  23 23 24  21*  GLUCOSE 176*  --  92 75 64* 84  BUN 26*  --  25* 28* 33* 35*  CREATININE 2.58*  --  2.80* 2.95* 3.39* 3.52*  CALCIUM 8.1*  --  8.0*  8.3* 8.6* 8.3*  MG  --  1.6*  --   --   --  1.9   GFR Estimated Creatinine Clearance: 18.4 mL/min (A) (by C-G formula based on SCr of 3.52 mg/dL (H)). Liver Function Tests: Recent Labs  Lab 01/24/19 0624  AST 19  ALT 14  ALKPHOS 67  BILITOT 0.4  PROT 4.9*  ALBUMIN 2.0*   No results for input(s): LIPASE, AMYLASE in the last 168 hours. No results for input(s): AMMONIA in the last 168 hours. Coagulation profile Recent Labs  Lab 01/23/19 1248 01/24/19 0535 01/25/19 0635 01/26/19 0417 01/27/19 0648  INR 1.1 1.1 1.4* 2.0* 2.3*    CBC: Recent Labs  Lab 01/23/19 1248 01/25/19 0635 01/26/19 0417  WBC 4.4 3.8* 3.7*  HGB 11.1* 10.8* 11.2*  HCT 35.8* 33.9* 35.2*  MCV 75.1* 73.1* 73.0*  PLT 260 264 265   Cardiac Enzymes: Recent Labs  Lab 01/23/19 1248  TROPONINI 0.03*   BNP (last 3 results) No results for input(s): PROBNP in the last 8760 hours. CBG: Recent Labs  Lab 01/26/19 1113 01/26/19 1706 01/26/19 2112 01/27/19 0539 01/27/19 1128  GLUCAP 129* 141* 188* 106* 83   D-Dimer: No results for input(s): DDIMER in the last 72 hours. Hgb A1c: No results for input(s): HGBA1C in the last 72 hours. Lipid Profile: No results for input(s): CHOL, HDL, LDLCALC, TRIG, CHOLHDL, LDLDIRECT in the last 72 hours. Thyroid function studies: No results for input(s): TSH, T4TOTAL, T3FREE, THYROIDAB in the last 72 hours.  Invalid input(s): FREET3 Anemia work up: Recent Labs    01/27/19 0648  FERRITIN 23  TIBC 273  IRON 19*   Sepsis Labs: Recent Labs  Lab 01/23/19 1248 01/25/19 0635 01/26/19 0417  WBC 4.4 3.8* 3.7*    Microbiology Recent Results (from the past 240 hour(s))  Urine Culture     Status: None   Collection Time: 01/23/19 10:53 AM   Specimen: Urine   UR  Result Value Ref Range Status   Urine Culture, Routine Final report  Final   Organism ID, Bacteria Comment  Final    Comment: Mixed urogenital flora 10,000-25,000 colony forming units per mL    Novel Coronavirus,NAA,(SEND-OUT TO REF LAB - TAT 24-48 hrs); Hosp Order     Status: None   Collection Time: 01/23/19  2:54 PM   Specimen: Nasopharyngeal Swab; Respiratory  Result Value Ref Range Status   SARS-CoV-2, NAA NOT DETECTED NOT DETECTED Final    Comment: (NOTE) This test was developed and its performance characteristics determined by [employer]. This test has not been FDA cleared or approved. This test has been authorized by FDA under an Emergency Use Authorization (EUA). This test is only authorized for the duration of time the declaration that circumstances exist justifying the authorization of the emergency use of in vitro diagnostic tests for detection of SARS-CoV-2 virus and/or diagnosis of COVID-19 infection under section 564(b)(1) of the Act, 21  U.S.C. 360bbb-3(b)(1), unless the authorization is terminated or revoked sooner. When diagnostic testing is negative, the possibility of a false negative result should be considered in the context of a patient's recent exposures and the presence of clinical signs and symptoms consistent with COVID-19. An individual without symptoms of COVID-19 and who is not shedding SARS-CoV-2 virus would expect to have a negative (not detected) result in this assay. Performed  At: [facility] [address] [place], [place] [id number] Rush Farmer MD SF:[id number]    [place]  Final    Comment: Performed at [facility], [place] [address], [place], [place] [address]    Procedures and diagnostic studies:  No results found.  Medications:   . atorvastatin  40 mg Oral q1800  . brimonidine  1 drop Left Eye BID  . carvedilol  25 mg Oral BID  . dorzolamide-timolol  1 drop Left Eye BID  . ferrous sulfate  325 mg Oral QODAY  . furosemide  80 mg Intravenous BID  . hydrALAZINE  100 mg Oral TID  . insulin aspart  0-5 Units Subcutaneous QHS  . insulin aspart  0-9 Units Subcutaneous TID WC  .  insulin glargine  15 Units Subcutaneous QHS  . isosorbide dinitrate  20 mg Oral TID  . potassium chloride  10 mEq Oral BID  . sodium chloride flush  3 mL Intravenous Q12H  . ticagrelor  90 mg Oral BID  . warfarin  2.5 mg Oral ONCE-1800  . Warfarin - Pharmacist Dosing Inpatient   Does not apply q1800   Continuous Infusions: . sodium chloride       LOS: 3 days   Geradine Girt  [facility]   How to contact the [facility] Attending or Consulting provider [place] or covering provider during after hours [place], for this patient?  1. Check the care team in [facility] and look for a) attending/consulting TR[facility] provider listed and b) the [facility] team listed 2. Log into [email or web address] and use 's universal password to access. If you do not have the password, please contact the hospital operator. 3. Locate the [facility] provider you are looking for under [facility] and page to a number that you can be directly reached. 4. If you still have difficulty reaching the provider, please page the [facility] (Director on Call) for the Hospitalists listed on amion for assistance.  01/27/2019, 1:25 PM

## 2019-01-27 NOTE — Progress Notes (Signed)
[place] for warfarin Indication: atrial fibrillation  Allergies  Allergen Reactions  . Ace Inhibitors Cough    Patient Measurements: Height: 5\' 6"  (167.6 cm) Weight: 191 lb 4.8 oz (86.8 kg) IBW/kg (Calculated) : 59.3  Vital Signs: Temp: 98.2 F (36.8 C) (06/19 0559) Temp Source: Oral (06/19 0559) BP: 176/81 (06/19 0559) Pulse Rate: 72 (06/19 0559)  Labs: Recent Labs    01/25/19 0635 01/26/19 0417 01/27/19 0648  HGB 10.8* 11.2*  --   HCT 33.9* 35.2*  --   PLT 264 265  --   LABPROT 17.4* 22.3* 24.8*  INR 1.4* 2.0* 2.3*  CREATININE 2.95* 3.39* 3.52*    Estimated Creatinine Clearance: 18.4 mL/min (A) (by C-G formula based on SCr of 3.52 mg/dL (H)).   Medical History: Past Medical History:  Diagnosis Date  . Anemia   . Chronic combined systolic and diastolic CHF (congestive heart failure) ([place])   . CKD (chronic kidney disease), stage IV ([place])   . Coronary artery disease 08/2017   DES x 2 RCA  . GERD (gastroesophageal reflux disease)   . Heart murmur   . Hyperlipidemia   . Hypertension   . Proliferative diabetic retinopathy ([place])    left eye with vitreous hemorrhage and tractional retinal detachment  . Stroke ([place]) 09/2014   numbness left upper lip, finger tips on left hand; "resolved" (05/18/2016)  . Type II diabetes mellitus ([place])   . Wears glasses     Assessment: 21 yof presented to the ED with hypertension and dizziness. She is on chronic warfarin for history of afib. She also had recent cataract surgery. INR was subtherapeutic on admission. -INR up to 2.0 (daily trend 1.4>>2.0>>2.3); likely in setting of HF  PTA dosing 5mg  daily  Goal of Therapy:  INR 2-3 Monitor platelets by anticoagulation protocol: Yes   Plan:  -Warfarin 2.5mg  today -Daily PT/INR  Hildred Laser, PharmD Clinical Pharmacist **Pharmacist phone directory can now be found on [email or web address] ([email or web address]).  Listed under [place].

## 2019-01-27 NOTE — Consult Note (Addendum)
[facility]    NEPHROLOGY CONSULTATION NOTE  PATIENT ID:  [name], DOB:  01/01/1956  HPI: The patient is a 63 y.o. year old female patient with a past medical history significant for atrial fibrillation, congestive heart failure with an ejection fraction of 25 to 30%, chronic kidney disease, diabetes, and hypertension who presented to the emergency department with a chief complaint of lower extremity edema.  She was found to be hypertensive with systolic blood pressures of 160-190 and was felt to have acute on chronic heart failure.  Her baseline creatinine from last year runs between 1.4 and 1.8.  Most recently, in December 2019, her creatinine had been in the mid twos.  Renal consultation has been called for acute kidney injury and nephrotic syndrome.  She reports a longstanding history of diabetes for greater than 10 years with associated diabetic retinopathy.   Past Medical History:  Diagnosis Date  . Anemia   . Chronic combined systolic and diastolic CHF (congestive heart failure) ([place])   . CKD (chronic kidney disease), stage IV ([place])   . Coronary artery disease 08/2017   DES x 2 RCA  . GERD (gastroesophageal reflux disease)   . Heart murmur   . Hyperlipidemia   . Hypertension   . Proliferative diabetic retinopathy ([place])    left eye with vitreous hemorrhage and tractional retinal detachment  . Stroke ([place]) 09/2014   numbness left upper lip, finger tips on left hand; "resolved" (05/18/2016)  . Type II diabetes mellitus ([place])   . Wears glasses     Past Surgical History:  Procedure Laterality Date  . CARDIAC CATHETERIZATION N/A 05/18/2016   Procedure: Left Heart Cath and Coronary Angiography;  Surgeon: Jettie Booze, MD;  Location: [place] [facility];  Service: Cardiovascular;  Laterality: N/A;  . CARDIAC CATHETERIZATION N/A 05/18/2016   Procedure: Coronary Stent Intervention;  Surgeon: Jettie Booze, MD;  Location: [place] [facility];  Service:  Cardiovascular;  Laterality: N/A;  . COLONOSCOPY W/ BIOPSIES AND POLYPECTOMY    . CORONARY ANGIOPLASTY    . CORONARY ANGIOPLASTY WITH STENT PLACEMENT    . CORONARY STENT INTERVENTION N/A 09/07/2017   Procedure: CORONARY STENT INTERVENTION;  Surgeon: Leonie Man, MD;  Location: [place] [facility];  Service: Cardiovascular;  Laterality: N/A;  . EYE SURGERY    . GAS INSERTION Left 01/13/2018   Procedure: INSERTION OF GAS;  Surgeon: Bernarda Caffey, MD;  Location: [place];  Service: Ophthalmology;  Laterality: Left;  . LEFT HEART CATH AND CORONARY ANGIOGRAPHY N/A 09/07/2017   Procedure: LEFT HEART CATH AND CORONARY ANGIOGRAPHY;  Surgeon: Leonie Man, MD;  Location: [place] [facility];  Service: Cardiovascular;  Laterality: N/A;  . MEMBRANE PEEL Left 01/13/2018   Procedure: MEMBRANE PEEL LEFT EYE ;  Surgeon: Bernarda Caffey, MD;  Location: [place];  Service: Ophthalmology;  Laterality: Left;  Marland Kitchen MULTIPLE TOOTH EXTRACTIONS    . PARS PLANA VITRECTOMY Left 01/13/2018   Procedure: PARS PLANA VITRECTOMY WITH 25 GAUGE LEFT EYE WITH ENDOLASER;  Surgeon: Bernarda Caffey, MD;  Location: [place];  Service: Ophthalmology;  Laterality: Left;  . TUBAL LIGATION  1980  . VAGINAL HYSTERECTOMY  1999   "partial; fibroids"    Family History  Problem Relation Age of Onset  . Diabetes Mother   . Hypertension Mother   . COPD Mother   . Heart failure Mother   . Hypertension Sister   . Hypertension Brother     Social History  Tobacco Use  . Smoking status: Never Smoker  . Smokeless tobacco: Never Used  Substance Use Topics  . Alcohol use: No    Alcohol/week: 0.0 standard drinks  . Drug use: No    REVIEW OF SYSTEMS: General:  no fatigue, no weakness Head:  no headaches Eyes:  no blurred vision ENT:  no sore throat Neck:  no masses CV:  no chest pain, no orthopnea, positive lower extremity edema Lungs:  no shortness of breath, no cough GI:  no nausea or vomiting, no diarrhea GU:  no dysuria or  hematuria Skin:  no rashes or lesions Neuro:  no focal numbness or weakness Psych:  no depression or anxiety    PHYSICAL EXAM:  Vitals:   01/27/19 0559 01/27/19 1532  BP: (!) 176/81 (!) 161/68  Pulse: 72 (!) 57  Resp: 18 15  Temp: 98.2 F (36.8 C) 97.7 F (36.5 C)  SpO2: 95% 98%   I/O last 3 completed shifts: In: 1060 [P.O.:1060] Out: 3800 [Urine:3800]   General:  AAOx3 NAD HEENT: MMM [place] AT anicteric sclera Neck:  No JVD, no adenopathy CV:  Heart RRR  Lungs:  L/S CTA bilaterally Abd:  abd SNT/ND with normal BS GU:  Bladder non-palpable Extremities: 3+ bilateral lower extremity edema Skin:  No skin rash Psych:  normal mood and affect Neuro:  no focal deficits   CURRENT MEDICATIONS:  . atorvastatin  40 mg Oral q1800  . brimonidine  1 drop Left Eye BID  . carvedilol  25 mg Oral BID  . dorzolamide-timolol  1 drop Left Eye BID  . ferrous sulfate  325 mg Oral QODAY  . furosemide  80 mg Intravenous BID  . hydrALAZINE  100 mg Oral TID  . insulin aspart  0-5 Units Subcutaneous QHS  . insulin aspart  0-9 Units Subcutaneous TID WC  . insulin glargine  15 Units Subcutaneous QHS  . isosorbide dinitrate  20 mg Oral TID  . potassium chloride  10 mEq Oral BID  . sodium chloride flush  3 mL Intravenous Q12H  . ticagrelor  90 mg Oral BID  . Warfarin - Pharmacist Dosing Inpatient   Does not apply q1800     HOME MEDICATIONS:  Prior to Admission medications   Medication Sig Start Date End Date Taking? Authorizing Provider  acetaminophen (TYLENOL) 500 MG tablet Take 1,000 mg by mouth every 6 (six) hours as needed for mild pain or headache.   Yes [provider]  amLODipine (NORVASC) 10 MG tablet Take 1 tablet (10 mg total) by mouth daily. 08/25/18  Yes Azzie Glatter, FNP  atorvastatin (LIPITOR) 40 MG tablet Take 1 tablet (40 mg total) by mouth daily at 6 PM. 04/26/17  Yes Scot Jun, FNP  brimonidine (ALPHAGAN) 0.2 % ophthalmic solution Place 1 drop into the  left eye 2 (two) times a day. 11/30/18  Yes Bernarda Caffey, MD  carvedilol (COREG) 25 MG tablet Take 0.5 tablets (12.5 mg total) by mouth 2 (two) times daily. 07/23/18  Yes Isabelle Course, MD  dorzolamide-timolol (COSOPT) 22.3-6.8 MG/ML ophthalmic solution Place 1 drop into the left eye 2 (two) times daily.   Yes [provider]  ferrous sulfate (FERROUSUL) 325 (65 FE) MG tablet Take 1 tablet (325 mg total) by mouth every other day. 11/22/17  Yes Herminio Commons, MD  furosemide (LASIX) 40 MG tablet Take 1 tablet (40 mg total) by mouth 2 (two) times daily. 01/23/19  Yes Azzie Glatter, FNP  glimepiride (  AMARYL) 4 MG tablet Take 1 tablet (4 mg total) by mouth daily with breakfast. 01/23/19  Yes Azzie Glatter, FNP  glipiZIDE (GLUCOTROL) 10 MG tablet Take 1 tablet (10 mg total) by mouth 2 (two) times daily before a meal. 11/28/18  Yes Azzie Glatter, FNP  hydrALAZINE (APRESOLINE) 50 MG tablet Take 1.5 tablets (75 mg total) by mouth 3 (three) times daily. 11/22/17  Yes Herminio Commons, MD  hydrochlorothiazide (HYDRODIURIL) 25 MG tablet Take 1 tablet (25 mg total) by mouth daily. 08/25/18  Yes Azzie Glatter, FNP  insulin glargine (LANTUS) 100 UNIT/ML injection Inject 0.2 mLs (20 Units total) into the skin 2 (two) times daily. 03/30/18  Yes Azzie Glatter, FNP  nitroGLYCERIN (NITROSTAT) 0.4 MG SL tablet Place 1 tablet (0.4 mg total) under the tongue every 5 (five) minutes x 3 doses as needed for chest pain. 07/26/18  Yes Azzie Glatter, FNP  potassium chloride (K-DUR,KLOR-CON) 10 MEQ tablet Take 1 tablet with each dose of furosemide, daily. Patient taking differently: Take 10 mEq by mouth 2 (two) times daily. Take 1 tablet with each dose of furosemide, daily. 10/26/18  Yes Azzie Glatter, FNP  ticagrelor (BRILINTA) 90 MG TABS tablet Take 1 tablet (90 mg total) by mouth 2 (two) times daily. 07/15/17  Yes Tresa Garter, MD  warfarin (COUMADIN) 5 MG tablet Take 1 to 1.5  tablets by mouth daily as directed by coumadin clinic Patient taking differently: Take 5 mg by mouth daily at 6 PM.  08/25/18  Yes Azzie Glatter, FNP  Glucose Blood (BLOOD GLUCOSE TEST STRIPS) STRP Use as directed 02/11/17   Hosie Poisson, MD  Insulin Pen Needle 29G X 10MM MISC Use as directed 09/22/17   Scot Jun, FNP  INSULIN SYRINGE .5CC/29G 29G X 1/2" 0.5 ML MISC Use to administer insulin three times daily 09/22/17   Scot Jun, FNP  TRUEPLUS PEN NEEDLES 31G X 6 MM MISC USE AS DIRECTED 06/02/18   Azzie Glatter, FNP       LABS:  CBC Latest Ref Rng & Units 01/26/2019 01/25/2019 01/23/2019  WBC 4.0 - 10.5 K/uL 3.7(L) 3.8(L) 4.4  Hemoglobin 12.0 - 15.0 g/dL 11.2(L) 10.8(L) 11.1(L)  Hematocrit 36.0 - 46.0 % 35.2(L) 33.9(L) 35.8(L)  Platelets 150 - 400 K/uL 265 264 260    CMP Latest Ref Rng & Units 01/27/2019 01/26/2019 01/25/2019  Glucose 70 - 99 mg/dL 84 64(L) 75  BUN 8 - 23 mg/dL 35(H) 33(H) 28(H)  Creatinine 0.44 - 1.00 mg/dL 3.52(H) 3.39(H) 2.95(H)  Sodium 135 - 145 mmol/L 137 137 138  Potassium 3.5 - 5.1 mmol/L 3.7 3.7 3.4(L)  Chloride 98 - 111 mmol/L 104 104 107  CO2 22 - 32 mmol/L 21(L) 24 23  Calcium 8.9 - 10.3 mg/dL 8.3(L) 8.6(L) 8.3(L)  Total Protein 6.5 - 8.1 g/dL - - -  Total Bilirubin 0.3 - 1.2 mg/dL - - -  Alkaline Phos 38 - 126 U/L - - -  AST 15 - 41 U/L - - -  ALT 0 - 44 U/L - - -    Lab Results  Component Value Date   CALCIUM 8.3 (L) 01/27/2019   CAION 1.11 (L) 12/20/2016   PHOS 4.5 05/24/2017       Component Value Date/Time   COLORURINE YELLOW 07/21/2018 1303   APPEARANCEUR HAZY (A) 07/21/2018 1303   LABSPEC 1.013 07/21/2018 1303   PHURINE 6.0 07/21/2018 1303   GLUCOSEU 150 (A) 07/21/2018 1303  HGBUR SMALL (A) 07/21/2018 1303   BILIRUBINUR negative 01/23/2019 1034   BILIRUBINUR neg 12/15/2017 0929   KETONESUR negative 01/23/2019 [place] 07/21/2018 1303   PROTEINUR >=300 (A) 01/23/2019 1034   PROTEINUR >=300 (A)  07/21/2018 1303   UROBILINOGEN 0.2 01/23/2019 1034   UROBILINOGEN 0.2 10/25/2017 0910   NITRITE Negative 01/23/2019 1034   NITRITE NEGATIVE 07/21/2018 1303   LEUKOCYTESUR Negative 01/23/2019 1034      Component Value Date/Time   TCO2 26 12/20/2016 1605       Component Value Date/Time   IRON 19 (L) 01/27/2019 0648   TIBC 273 01/27/2019 0648   FERRITIN 23 01/27/2019 0648   IRONPCTSAT 7 (L) 01/27/2019 0648   IRONPCTSAT 13 05/24/2017 1057       ASSESSMENT/PLAN:  The patient is a 63 y.o. year old female patient with a past medical history significant for atrial fibrillation, congestive heart failure with an ejection fraction of 25 to 30%, chronic kidney disease, diabetes, and hypertension who presented to the emergency department with a chief complaint of lower extremity edema.  She was found to be hypertensive with systolic blood pressures of 160-190 and was felt to have acute on chronic heart failure.  Her baseline creatinine from last year runs between 1.4 and 1.8.  Most recently, in December 2019, her creatinine had been in the mid twos.    1.  Chronic kidney disease stage IV.  Her most recent baseline creatinine appears to be around 2.5.  This is likely on the basis of longstanding diabetes.  She has had persistent glucose in her urine and hemoglobin A1c's in the 10-11 range since March 2020.  She does have significant proteinuria, which we will quantify.  We will also check an SPEP for completeness.  I suspect this is related to progressive diabetic nephropathy.  Tight diabetes control is prudent.  We will check an intact PTH and phosphorus.  Is allergic to ACE inhibition.  2.  Nephrotic syndrome.  Will check a urine protein to creatinine ratio.  Check a lipid panel as well.  She had over 5 g of proteinuria in October 2019.  We will check an SPEP and ANA for completeness.  3.  Chronic systolic congestive heart failure.  Continue diuretics.  4.  Anemia of chronic kidney disease.   Hemoglobin is above goal.  5.  Diabetes mellitus.  Tight diabetes control is prudent.  6.  Hypertensive urgency.  Would uptitrate the dose of carvedilol.  Given proteinuria, non-dihydropyridine calcium channel blockers would be the next drug of choice due to allergy with ACE inhibition.   [place], DO, [place]

## 2019-01-27 NOTE — Progress Notes (Signed)
Brief Nutrition Education Follow-Up Note  RD attempted diet education on CHF, carb modified diet on 01/25/19, however, was unable to reach pt during multiple attempts.   RD attempted in-person education today, however, pt with in with MD at time of visit.   RD attached "Heart Healthy, Consistent Carbohydrate Nutrition Therapy", "Low Sodium Nutrition Therapy" and "Sodium Free Seasoning Tips" handouts from the Academy of Nutrition and Dietetics to AVS/discharge summary.  Body mass index is 31.49 kg/m. Pt meets criteria for obesity, class I based on current BMI.  Current diet order is heart healthy/ carb modified, patient is consuming approximately 80-100% of meals at this time. Labs and medications reviewed. No further nutrition interventions warranted at this time. RD contact information provided. If additional nutrition issues arise, please re-consult RD.   Kajol Crispen A. Jimmye Norman, RD, LDN, [place] Registered Dietitian II Certified Diabetes Care and Education Specialist Pager: [phone] After hours Pager: [phone]

## 2019-01-27 NOTE — Plan of Care (Signed)
  Problem: Clinical Measurements: Goal: Will remain free from infection Outcome: Completed/Met Goal: Respiratory complications will improve Outcome: Completed/Met   Problem: Nutrition: Goal: Adequate nutrition will be maintained Outcome: Completed/Met   Problem: Coping: Goal: Level of anxiety will decrease Outcome: Completed/Met   Problem: Elimination: Goal: Will not experience complications related to bowel motility Outcome: Completed/Met Goal: Will not experience complications related to urinary retention Outcome: Completed/Met   Problem: Pain Managment: Goal: General experience of comfort will improve Outcome: Completed/Met   Problem: Safety: Goal: Ability to remain free from injury will improve Outcome: Completed/Met   Problem: Skin Integrity: Goal: Risk for impaired skin integrity will decrease Outcome: Completed/Met   

## 2019-01-27 NOTE — Care Management Important Message (Signed)
Important Message  Patient Details  Name: Amber Young MRN: [id number] Date of Birth: February 07, 1956   Medicare Important Message Given:  Yes     Shelda Altes 01/27/2019, 12:00 PM

## 2019-01-28 LAB — PHOSPHORUS: Phosphorus: 4.2 mg/dL (ref 2.5–4.6)

## 2019-01-28 LAB — BASIC METABOLIC PANEL
Anion gap: 8 (ref 5–15)
BUN: 36 mg/dL — ABNORMAL HIGH (ref 8–23)
CO2: 26 mmol/L (ref 22–32)
Calcium: 8.3 mg/dL — ABNORMAL LOW (ref 8.9–10.3)
Chloride: 103 mmol/L (ref 98–111)
Creatinine, Ser: 3.45 mg/dL — ABNORMAL HIGH (ref 0.44–1.00)
GFR calc Af Amer: 16 mL/min — ABNORMAL LOW (ref >60)
GFR calc non Af Amer: 13 mL/min — ABNORMAL LOW (ref >60)
Glucose, Bld: 61 mg/dL — ABNORMAL LOW (ref 70–99)
Potassium: 3.6 mmol/L (ref 3.5–5.1)
Sodium: 137 mmol/L (ref 135–145)

## 2019-01-28 LAB — LIPID PANEL
Cholesterol: 217 mg/dL — ABNORMAL HIGH (ref 0–200)
HDL: 75 mg/dL (ref >40)
LDL Cholesterol: 130 mg/dL — ABNORMAL HIGH (ref 0–99)
Total CHOL/HDL Ratio: 2.9 RATIO
Triglycerides: 59 mg/dL (ref <150)
VLDL: 12 mg/dL (ref 0–40)

## 2019-01-28 LAB — GLUCOSE, CAPILLARY
Glucose-Capillary: 54 mg/dL — ABNORMAL LOW (ref 70–99)
Glucose-Capillary: 115 mg/dL — ABNORMAL HIGH (ref 70–99)
Glucose-Capillary: 112 mg/dL — ABNORMAL HIGH (ref 70–99)
Glucose-Capillary: 115 mg/dL — ABNORMAL HIGH (ref 70–99)

## 2019-01-28 LAB — PROTIME-INR
INR: 2.3 — ABNORMAL HIGH (ref 0.8–1.2)
Prothrombin Time: 24.9 seconds — ABNORMAL HIGH (ref 11.4–15.2)

## 2019-01-28 MED ORDER — METOLAZONE 2.5 MG PO TABS
2.5000 mg | ORAL_TABLET | Freq: Every day | ORAL | Status: AC
  Administered 2019-01-28 – 2019-01-30 (×3): 2.5 mg via ORAL
  Filled 2019-01-28 (×3): qty 1

## 2019-01-28 MED ORDER — WARFARIN SODIUM 5 MG PO TABS
5.0000 mg | ORAL_TABLET | Freq: Once | ORAL | Status: AC
  Administered 2019-01-28: 19:00:00 5 mg via ORAL
  Filled 2019-01-28: qty 1

## 2019-01-28 NOTE — Progress Notes (Signed)
Progress Note  Patient Name: Amber Young Date of Encounter: 01/28/2019  Primary Cardiologist: Kate Sable, MD   Subjective   No chest pain or sob.  Inpatient Medications    Scheduled Meds: . atorvastatin  40 mg Oral q1800  . brimonidine  1 drop Left Eye BID  . carvedilol  25 mg Oral BID  . dorzolamide-timolol  1 drop Left Eye BID  . ferrous sulfate  325 mg Oral QODAY  . furosemide  80 mg Intravenous BID  . hydrALAZINE  100 mg Oral TID  . insulin aspart  0-5 Units Subcutaneous QHS  . insulin aspart  0-9 Units Subcutaneous TID WC  . isosorbide dinitrate  20 mg Oral TID  . metolazone  2.5 mg Oral Daily  . potassium chloride  10 mEq Oral BID  . sodium chloride flush  3 mL Intravenous Q12H  . ticagrelor  90 mg Oral BID  . warfarin  5 mg Oral ONCE-1800  . Warfarin - Pharmacist Dosing Inpatient   Does not apply q1800   Continuous Infusions: . sodium chloride     PRN Meds: sodium chloride, acetaminophen, ALPRAZolam, hydrALAZINE, nitroGLYCERIN, ondansetron (ZOFRAN) IV, sodium chloride flush   Vital Signs    Vitals:   01/27/19 2018 01/28/19 0514 01/28/19 0651 01/28/19 1010  BP: (!) 149/90 (!) 168/74  (!) 172/76  Pulse: 65 (!) 58  (!) 57  Resp: 16 16    Temp: 97.7 F (36.5 C) 98 F (36.7 C)  98.2 F (36.8 C)  TempSrc: Oral Oral  Oral  SpO2: 100% 99%  100%  Weight:   85.8 kg   Height:        Intake/Output Summary (Last 24 hours) at 01/28/2019 1315 Last data filed at 01/28/2019 0510 Gross per 24 hour  Intake 480 ml  Output 1000 ml  Net -520 ml   Filed Weights   01/26/19 0118 01/27/19 0116 01/28/19 0651  Weight: 88.5 kg 86.8 kg 85.8 kg    Telemetry    nsr - Personally Reviewed  ECG    none - Personally Reviewed  Physical Exam   GEN: No acute distress.   Neck: No JVD Cardiac: RRR Respiratory: no increased work of breathing. GI: Soft, nontender, non-distended  MS: No edema; No deformity. Neuro:  Nonfocal  Psych: Normal affect   Labs   Chemistry Recent Labs  Lab 01/24/19 0624  01/26/19 0417 01/27/19 0648 01/28/19 0536  NA  --    < > 137 137 137  K  --    < > 3.7 3.7 3.6  CL  --    < > 104 104 103  CO2  --    < > 24 21* 26  GLUCOSE  --    < > 64* 84 61*  BUN  --    < > 33* 35* 36*  CREATININE  --    < > 3.39* 3.52* 3.45*  CALCIUM  --    < > 8.6* 8.3* 8.3*  PROT 4.9*  --   --   --   --   ALBUMIN 2.0*  --   --   --   --   AST 19  --   --   --   --   ALT 14  --   --   --   --   ALKPHOS 67  --   --   --   --   BILITOT 0.4  --   --   --   --  GFRNONAA  --    < > 14* 13* 13*  GFRAA  --    < > 16* 15* 16*  ANIONGAP  --    < > 9 12 8    < > = values in this interval not displayed.     Hematology Recent Labs  Lab 01/23/19 1248 01/25/19 0635 01/26/19 0417  WBC 4.4 3.8* 3.7*  RBC 4.77 4.64 4.82  HGB 11.1* 10.8* 11.2*  HCT 35.8* 33.9* 35.2*  MCV 75.1* 73.1* 73.0*  MCH 23.3* 23.3* 23.2*  MCHC 31.0 31.9 31.8  RDW 15.4 15.9* 15.9*  PLT 260 264 265    Cardiac Enzymes Recent Labs  Lab 01/23/19 1248  TROPONINI 0.03*   No results for input(s): TROPIPOC in the last 168 hours.   BNP Recent Labs  Lab 01/23/19 1715  BNP 2,666.4*     DDimer No results for input(s): DDIMER in the last 168 hours.   Radiology    No results found.  Cardiac Studies   none  Patient Profile     63 y.o. female admitted with acute on chronic systolic and diastolic heart failure  Assessment & Plan    1. Acute on chronic systolic heart failure - her dyspnea is improved and weight is down about 5 lbs from admit. Diuresis is difficult in the setting of renal dysfunction. I suspect she will need more IV lasix. 2. HTN - her blood pressure is still elevated. We will follow. 3. PAF - she appears to be maintaining NSR.     For questions or updates, please contact [place] Please consult [email or web address] for contact info under Cardiology/STEMI.      Signed, Cristopher Peru, MD  01/28/2019, 1:15 PM  Patient ID: Amber Young, female   DOB: 1955-12-21, 63 y.o.   MRN: [id number]

## 2019-01-28 NOTE — Progress Notes (Signed)
Progress Note    Amber Young  UTM:[id number] DOB: 10/14/1955  DOA: 01/23/2019 PCP: Azzie Glatter, FNP    Brief Narrative:     Medical records reviewed and are as summarized below:  Amber Young is an 63 y.o. female with medical history significant of afib, CHF, CKD, diabetes presents to ED with cc LE edema, htn. Initial workup reveals acute on chronic heart failure, hypertensive urgency.    Assessment/Plan:   Principal Problem:   Acute on chronic combined systolic (congestive) and diastolic (congestive) heart failure (HCC) Active Problems:   Uncontrolled diabetes mellitus (HCC)   Hypokalemia   PAF (paroxysmal atrial fibrillation) (HCC)   CKD (chronic kidney disease), stage IV (HCC)   Hypertensive urgency   Essential hypertension   Acute on chronic heart failure (HCC)   Acute on chronic combined systolic and diastolic heart failure.  Likely multifactorial specifically missing doses of Lasix dietary indiscretion uncontrolled blood pressure.  Echo in December 2019 reveals an EF of 40% severe anteroseptal apical akinesis.  Home medications include Lasix, Coreg, HCTZ.  -monitor on tele -IV lasix at a higher dose== better response -Repeat echo shows diffuse hypokinesis with EF 25-30 -Cardiology consult appreciated -Daily weights- trending down -Intake and output-- down 2.7L  Hypertensive urgency.  -  Home medications include hydralazine, Lasix, Coreg, Apresoline, hydrochlorothiazide, Norvasc.   -Resume home meds -titrate as able  Paroxysmal A. fib.   -chadvasc score 4.  -continue home meds including warfarin   Hypokalemia.   -Repleted along with  magnesium   Uncontrolled Diabetes type 2 with hyperglycemia.  Home medications include Lantus and oral agents. -hold lantus as fasting blood sugar low likely related to kidney function -holding Oral agents as appetite unreliable -Sliding scale insulin for optimal control -diabetic coordinator consult  appreciated  Chronic kidney disease/nephrotic syndrome -low albumin -3+ protein in urine -needs better blood sugar control as well as BP -was on losartan until Dec 2019-- held up on d/c for worsening kidney failure and hypotension-- but this was never re-started -nephrology consult appreciated   Family Communication/Anticipated D/C date and plan/Code Status   DVT prophylaxis: coumadin Code Status: Full Code.  Family Communication:  Disposition Plan: needs continued diuresis and close labs monitoring  Medical Consultants:    Cards  renal     Subjective:  No CP, eating well  Objective:    Vitals:   01/27/19 2018 01/28/19 0514 01/28/19 0651 01/28/19 1010  BP: (!) 149/90 (!) 168/74  (!) 172/76  Pulse: 65 (!) 58  (!) 57  Resp: 16 16    Temp: 97.7 F (36.5 C) 98 F (36.7 C)  98.2 F (36.8 C)  TempSrc: Oral Oral  Oral  SpO2: 100% 99%  100%  Weight:   85.8 kg   Height:        Intake/Output Summary (Last 24 hours) at 01/28/2019 1109 Last data filed at 01/28/2019 0510 Gross per 24 hour  Intake 720 ml  Output 1000 ml  Net -280 ml   Filed Weights   01/26/19 0118 01/27/19 0116 01/28/19 0651  Weight: 88.5 kg 86.8 kg 85.8 kg    Exam: In chair- eating lunch rrr TED hose on- less edema in B/L LE A+Ox3  Data Reviewed:   I have personally reviewed following labs and imaging studies:  Labs: Labs show the following:   Basic Metabolic Panel: Recent Labs  Lab 01/23/19 1651 01/24/19 0535 01/25/19 0635 01/26/19 0417 01/27/19 0648 01/28/19 0536  NA  --  138 138  137 137 137  K  --  3.6 3.4* 3.7 3.7 3.6  CL  --  106 107 104 104 103  CO2  --  23 23 24  21* 26  GLUCOSE  --  92 75 64* 84 61*  BUN  --  25* 28* 33* 35* 36*  CREATININE  --  2.80* 2.95* 3.39* 3.52* 3.45*  CALCIUM  --  8.0* 8.3* 8.6* 8.3* 8.3*  MG 1.6*  --   --   --  1.9  --   PHOS  --   --   --   --   --  4.2   GFR Estimated Creatinine Clearance: 18.7 mL/min (A) (by C-G formula based on SCr  of 3.45 mg/dL (H)). Liver Function Tests: Recent Labs  Lab 01/24/19 0624  AST 19  ALT 14  ALKPHOS 67  BILITOT 0.4  PROT 4.9*  ALBUMIN 2.0*   No results for input(s): LIPASE, AMYLASE in the last 168 hours. No results for input(s): AMMONIA in the last 168 hours. Coagulation profile Recent Labs  Lab 01/24/19 0535 01/25/19 0635 01/26/19 0417 01/27/19 0648 01/28/19 0536  INR 1.1 1.4* 2.0* 2.3* 2.3*    CBC: Recent Labs  Lab 01/23/19 1248 01/25/19 0635 01/26/19 0417  WBC 4.4 3.8* 3.7*  HGB 11.1* 10.8* 11.2*  HCT 35.8* 33.9* 35.2*  MCV 75.1* 73.1* 73.0*  PLT 260 264 265   Cardiac Enzymes: Recent Labs  Lab 01/23/19 1248  TROPONINI 0.03*   BNP (last 3 results) No results for input(s): PROBNP in the last 8760 hours. CBG: Recent Labs  Lab 01/27/19 0539 01/27/19 1128 01/27/19 1651 01/27/19 2127 01/28/19 0627  GLUCAP 106* 83 151* 147* 54*   D-Dimer: No results for input(s): DDIMER in the last 72 hours. Hgb A1c: No results for input(s): HGBA1C in the last 72 hours. Lipid Profile: Recent Labs    01/28/19 0536  CHOL 217*  HDL 75  LDLCALC 130*  TRIG 59  CHOLHDL 2.9   Thyroid function studies: No results for input(s): TSH, T4TOTAL, T3FREE, THYROIDAB in the last 72 hours.  Invalid input(s): FREET3 Anemia work up: Recent Labs    01/27/19 0648  FERRITIN 23  TIBC 273  IRON 19*   Sepsis Labs: Recent Labs  Lab 01/23/19 1248 01/25/19 0635 01/26/19 0417  WBC 4.4 3.8* 3.7*    Microbiology Recent Results (from the past 240 hour(s))  Urine Culture     Status: None   Collection Time: 01/23/19 10:53 AM   Specimen: Urine   UR  Result Value Ref Range Status   Urine Culture, Routine Final report  Final   Organism ID, Bacteria Comment  Final    Comment: Mixed urogenital flora 10,000-25,000 colony forming units per mL   Novel Coronavirus,NAA,(SEND-OUT TO REF LAB - TAT 24-48 hrs); Hosp Order     Status: None   Collection Time: 01/23/19  2:54 PM    Specimen: Nasopharyngeal Swab; Respiratory  Result Value Ref Range Status   SARS-CoV-2, NAA NOT DETECTED NOT DETECTED Final    Comment: (NOTE) This test was developed and its performance characteristics determined by [employer]. This test has not been FDA cleared or approved. This test has been authorized by FDA under an Emergency Use Authorization (EUA). This test is only authorized for the duration of time the declaration that circumstances exist justifying the authorization of the emergency use of in vitro diagnostic tests for detection of SARS-CoV-2 virus and/or diagnosis of COVID-19 infection under section 564(b)(1) of the Act,  21 U.S.C. 360bbb-3(b)(1), unless the authorization is terminated or revoked sooner. When diagnostic testing is negative, the possibility of a false negative result should be considered in the context of a patient's recent exposures and the presence of clinical signs and symptoms consistent with COVID-19. An individual without symptoms of COVID-19 and who is not shedding SARS-CoV-2 virus would expect to have a negative (not detected) result in this assay. Performed  At: [facility] [address] [place], [place] [id number] Rush Farmer MD EU:[id number]    [place]  Final    Comment: Performed at [facility], [place] [address], [place], [place] [address]    Procedures and diagnostic studies:  No results found.  Medications:   . atorvastatin  40 mg Oral q1800  . brimonidine  1 drop Left Eye BID  . carvedilol  25 mg Oral BID  . dorzolamide-timolol  1 drop Left Eye BID  . ferrous sulfate  325 mg Oral QODAY  . furosemide  80 mg Intravenous BID  . hydrALAZINE  100 mg Oral TID  . insulin aspart  0-5 Units Subcutaneous QHS  . insulin aspart  0-9 Units Subcutaneous TID WC  . isosorbide dinitrate  20 mg Oral TID  . metolazone  2.5 mg Oral Daily  . potassium chloride  10 mEq Oral BID  . sodium chloride  flush  3 mL Intravenous Q12H  . ticagrelor  90 mg Oral BID  . warfarin  5 mg Oral ONCE-1800  . Warfarin - Pharmacist Dosing Inpatient   Does not apply q1800   Continuous Infusions: . sodium chloride       LOS: 4 days   Geradine Girt  [facility]   How to contact the [facility] Attending or Consulting provider [place] or covering provider during after hours [place], for this patient?  1. Check the care team in [facility] and look for a) attending/consulting [facility] provider listed and b) the [facility] team listed 2. Log into [email or web address] and use [place]'s universal password to access. If you do not have the password, please contact the hospital operator. 3. Locate the [facility] provider you are looking for under [facility] and page to a number that you can be directly reached. 4. If you still have difficulty reaching the provider, please page the [facility] (Director on Call) for the Hospitalists listed on amion for assistance.  01/28/2019, 11:09 AM

## 2019-01-28 NOTE — Progress Notes (Signed)
[place] for warfarin Indication: atrial fibrillation  Allergies  Allergen Reactions  . Ace Inhibitors Cough    Patient Measurements: Height: 5\' 6"  (167.6 cm) Weight: 189 lb 3.2 oz (85.8 kg) IBW/kg (Calculated) : 59.3  Vital Signs: Temp: 98 F (36.7 C) (06/20 0514) Temp Source: Oral (06/20 0514) BP: 168/74 (06/20 0514) Pulse Rate: 58 (06/20 0514)  Labs: Recent Labs    01/26/19 0417 01/27/19 0648 01/28/19 0536  HGB 11.2*  --   --   HCT 35.2*  --   --   PLT 265  --   --   LABPROT 22.3* 24.8* 24.9*  INR 2.0* 2.3* 2.3*  CREATININE 3.39* 3.52* 3.45*    Estimated Creatinine Clearance: 18.7 mL/min (A) (by C-G formula based on SCr of 3.45 mg/dL (H)).   Medical History: Past Medical History:  Diagnosis Date  . Anemia   . Chronic combined systolic and diastolic CHF (congestive heart failure) ([place])   . CKD (chronic kidney disease), stage IV ([place])   . Coronary artery disease 08/2017   DES x 2 RCA  . GERD (gastroesophageal reflux disease)   . Heart murmur   . Hyperlipidemia   . Hypertension   . Proliferative diabetic retinopathy ([place])    left eye with vitreous hemorrhage and tractional retinal detachment  . Stroke ([place]) 09/2014   numbness left upper lip, finger tips on left hand; "resolved" (05/18/2016)  . Type II diabetes mellitus ([place])   . Wears glasses     Assessment: 33 yof presented to the ED with hypertension and dizziness. She is on chronic warfarin for history of afib. She also had recent cataract surgery. INR was subtherapeutic on admission.  INR remains therapeutic at 2.3 today. No reports of bleeding.   PTA dosing 5mg  daily  Goal of Therapy:  INR 2-3 Monitor platelets by anticoagulation protocol: Yes   Plan:  -Warfarin 5mg  x1 today -Daily PT/INR, CBC, s/sx bleeding    Juanell Fairly, PharmD PGY1 Pharmacy Resident 01/28/2019 8:38 AM **Pharmacist phone directory can now be found on [email or web address] (PW [email or web address]).   Listed under [place].

## 2019-01-28 NOTE — Progress Notes (Signed)
[facility]    NEPHROLOGY PROGRESS NOTE  SUBJECTIVE: Patient without complaint today.  Patient seen and examined.  Anxious to go home.  Patient denies chest pain, shortness of breath, nausea, vomiting, diarrhea or dysuria.  Has had minimal improvement in lower extremity edema overnight.  All other review of systems are negative.   OBJECTIVE:  Vitals:   01/27/19 2018 01/28/19 0514  BP: (!) 149/90 (!) 168/74  Pulse: 65 (!) 58  Resp: 16 16  Temp: 97.7 F (36.5 C) 98 F (36.7 C)  SpO2: 100% 99%    Intake/Output Summary (Last 24 hours) at 01/28/2019 0950 Last data filed at 01/28/2019 0510 Gross per 24 hour  Intake 720 ml  Output 1000 ml  Net -280 ml      General:  AAOx3 NAD HEENT: MMM [place] AT anicteric sclera Neck:  No JVD, no adenopathy CV:  Heart RRR  Lungs:  L/S CTA bilaterally Abd:  abd SNT/ND with normal BS GU:  Bladder non-palpable Extremities: +3 bilateral LE edema. Skin:  No skin rash  MEDICATIONS:  . atorvastatin  40 mg Oral q1800  . brimonidine  1 drop Left Eye BID  . carvedilol  25 mg Oral BID  . dorzolamide-timolol  1 drop Left Eye BID  . ferrous sulfate  325 mg Oral QODAY  . furosemide  80 mg Intravenous BID  . hydrALAZINE  100 mg Oral TID  . insulin aspart  0-5 Units Subcutaneous QHS  . insulin aspart  0-9 Units Subcutaneous TID WC  . insulin glargine  10 Units Subcutaneous QHS  . isosorbide dinitrate  20 mg Oral TID  . potassium chloride  10 mEq Oral BID  . sodium chloride flush  3 mL Intravenous Q12H  . ticagrelor  90 mg Oral BID  . warfarin  5 mg Oral ONCE-1800  . Warfarin - Pharmacist Dosing Inpatient   Does not apply q1800       LABS:   CBC Latest Ref Rng & Units 01/26/2019 01/25/2019 01/23/2019  WBC 4.0 - 10.5 K/uL 3.7(L) 3.8(L) 4.4  Hemoglobin 12.0 - 15.0 g/dL 11.2(L) 10.8(L) 11.1(L)  Hematocrit 36.0 - 46.0 % 35.2(L) 33.9(L) 35.8(L)  Platelets 150 - 400 K/uL 265 264 260    CMP Latest Ref Rng & Units 01/28/2019 01/27/2019  01/26/2019  Glucose 70 - 99 mg/dL 61(L) 84 64(L)  BUN 8 - 23 mg/dL 36(H) 35(H) 33(H)  Creatinine 0.44 - 1.00 mg/dL 3.45(H) 3.52(H) 3.39(H)  Sodium 135 - 145 mmol/L 137 137 137  Potassium 3.5 - 5.1 mmol/L 3.6 3.7 3.7  Chloride 98 - 111 mmol/L 103 104 104  CO2 22 - 32 mmol/L 26 21(L) 24  Calcium 8.9 - 10.3 mg/dL 8.3(L) 8.3(L) 8.6(L)  Total Protein 6.5 - 8.1 g/dL - - -  Total Bilirubin 0.3 - 1.2 mg/dL - - -  Alkaline Phos 38 - 126 U/L - - -  AST 15 - 41 U/L - - -  ALT 0 - 44 U/L - - -    Lab Results  Component Value Date   CALCIUM 8.3 (L) 01/28/2019   CAION 1.11 (L) 12/20/2016   PHOS 4.2 01/28/2019       Component Value Date/Time   COLORURINE YELLOW 07/21/2018 1303   APPEARANCEUR HAZY (A) 07/21/2018 1303   LABSPEC 1.013 07/21/2018 1303   PHURINE 6.0 07/21/2018 1303   GLUCOSEU 150 (A) 07/21/2018 1303   HGBUR SMALL (A) 07/21/2018 1303   BILIRUBINUR negative 01/23/2019 1034   BILIRUBINUR neg 12/15/2017 2536  KETONESUR negative 01/23/2019 [place] 07/21/2018 1303   PROTEINUR >=300 (A) 01/23/2019 1034   PROTEINUR >=300 (A) 07/21/2018 1303   UROBILINOGEN 0.2 01/23/2019 1034   UROBILINOGEN 0.2 10/25/2017 0910   NITRITE Negative 01/23/2019 1034   NITRITE NEGATIVE 07/21/2018 1303   LEUKOCYTESUR Negative 01/23/2019 1034      Component Value Date/Time   TCO2 26 12/20/2016 1605       Component Value Date/Time   IRON 19 (L) 01/27/2019 0648   TIBC 273 01/27/2019 0648   FERRITIN 23 01/27/2019 0648   IRONPCTSAT 7 (L) 01/27/2019 0648   IRONPCTSAT 13 05/24/2017 1057       ASSESSMENT/PLAN:     1.  Chronic kidney disease stage IV.  Her most recent baseline creatinine appears to be around 2.5.  This is likely on the basis of longstanding diabetes.  She has had persistent glucose in her urine and hemoglobin A1c's in the 10-11 range since March 2020.  She does have significant proteinuria, which we will quantify.  We will also check an SPEP for completeness.  I suspect  this is related to progressive diabetic nephropathy.  Tight diabetes control is prudent.  We will check an intact PTH.  Phosphorus stable. Is allergic to ACE inhibition.  2.  Nephrotic syndrome.  Will check a urine protein to creatinine ratio.  Check a lipid panel as well.  She had over 5 g of proteinuria in October 2019.  We will check an SPEP and ANA for completeness, which are pending.  3.  Chronic systolic congestive heart failure.  Continue diuretics.  We will add metolazone today.  4.  Anemia of chronic kidney disease.  Hemoglobin is above goal.  5.  Diabetes mellitus.  Tight diabetes control is prudent.  6.  Hypertensive urgency.  Would uptitrate the dose of carvedilol.  Given proteinuria, non-dihydropyridine calcium channel blockers would be the next drug of choice due to allergy with ACE inhibition.     [place], DO, [place]

## 2019-01-29 DIAGNOSIS — I10 Essential (primary) hypertension: Secondary | ICD-10-CM

## 2019-01-29 DIAGNOSIS — N184 Chronic kidney disease, stage 4 (severe): Secondary | ICD-10-CM

## 2019-01-29 DIAGNOSIS — I160 Hypertensive urgency: Secondary | ICD-10-CM

## 2019-01-29 DIAGNOSIS — I480 Paroxysmal atrial fibrillation: Secondary | ICD-10-CM

## 2019-01-29 LAB — GLUCOSE, CAPILLARY
Glucose-Capillary: 59 mg/dL — ABNORMAL LOW (ref 70–99)
Glucose-Capillary: 90 mg/dL (ref 70–99)
Glucose-Capillary: 129 mg/dL — ABNORMAL HIGH (ref 70–99)
Glucose-Capillary: 100 mg/dL — ABNORMAL HIGH (ref 70–99)
Glucose-Capillary: 212 mg/dL — ABNORMAL HIGH (ref 70–99)

## 2019-01-29 LAB — BASIC METABOLIC PANEL
Anion gap: 10 (ref 5–15)
BUN: 36 mg/dL — ABNORMAL HIGH (ref 8–23)
CO2: 26 mmol/L (ref 22–32)
Calcium: 8.3 mg/dL — ABNORMAL LOW (ref 8.9–10.3)
Chloride: 100 mmol/L (ref 98–111)
Creatinine, Ser: 3.54 mg/dL — ABNORMAL HIGH (ref 0.44–1.00)
GFR calc Af Amer: 15 mL/min — ABNORMAL LOW (ref >60)
GFR calc non Af Amer: 13 mL/min — ABNORMAL LOW (ref >60)
Glucose, Bld: 71 mg/dL (ref 70–99)
Potassium: 3.9 mmol/L (ref 3.5–5.1)
Sodium: 136 mmol/L (ref 135–145)

## 2019-01-29 LAB — CBC
HCT: 31.1 % — ABNORMAL LOW (ref 36.0–46.0)
Hemoglobin: 9.9 g/dL — ABNORMAL LOW (ref 12.0–15.0)
MCH: 23.1 pg — ABNORMAL LOW (ref 26.0–34.0)
MCHC: 31.8 g/dL (ref 30.0–36.0)
MCV: 72.5 fL — ABNORMAL LOW (ref 80.0–100.0)
Platelets: 293 10*3/uL (ref 150–400)
RBC: 4.29 MIL/uL (ref 3.87–5.11)
RDW: 15.9 % — ABNORMAL HIGH (ref 11.5–15.5)
WBC: 4.4 10*3/uL (ref 4.0–10.5)
nRBC: 0 % (ref 0.0–0.2)

## 2019-01-29 LAB — PROTIME-INR
INR: 2.6 — ABNORMAL HIGH (ref 0.8–1.2)
Prothrombin Time: 27.6 seconds — ABNORMAL HIGH (ref 11.4–15.2)

## 2019-01-29 MED ORDER — WARFARIN SODIUM 5 MG PO TABS
5.0000 mg | ORAL_TABLET | Freq: Once | ORAL | Status: AC
  Administered 2019-01-29: 5 mg via ORAL
  Filled 2019-01-29: qty 1

## 2019-01-29 NOTE — Progress Notes (Signed)
Progress Note    Amber Young  PNT:[id number] DOB: Feb 02, 1956  DOA: 01/23/2019 PCP: Azzie Glatter, FNP    Brief Narrative:     Medical records reviewed and are as summarized below:  Amber Young is an 63 y.o. female with medical history significant of afib, CHF, CKD, diabetes presents to ED with cc LE edema, htn. Initial workup reveals acute on chronic heart failure, hypertensive urgency.    Assessment/Plan:   Principal Problem:   Acute on chronic combined systolic (congestive) and diastolic (congestive) heart failure (HCC) Active Problems:   Uncontrolled diabetes mellitus (HCC)   Hypokalemia   PAF (paroxysmal atrial fibrillation) (HCC)   CKD (chronic kidney disease), stage IV (HCC)   Hypertensive urgency   Essential hypertension   Acute on chronic heart failure (HCC)   Acute on chronic combined systolic and diastolic heart failure.  Likely multifactorial specifically missing doses of Lasix dietary indiscretion uncontrolled blood pressure.  Echo in December 2019 reveals an EF of 40% severe anteroseptal apical akinesis.  Home medications include Lasix, Coreg, HCTZ.   -IV lasix at a higher dose== better response-- metolazone added -Repeat echo shows diffuse hypokinesis with EF 25-30 -Cardiology consult appreciated -Daily weights- trending down -Intake and output-- down 6L  Hypertensive urgency.  -  Home medications include hydralazine, Lasix, Coreg, Apresoline, hydrochlorothiazide, Norvasc.   -Resume home meds -titrate as able  Paroxysmal A. fib.   -chadvasc score 4.  -continue home meds including warfarin   Hypokalemia.   -Repleted along with  magnesium   Uncontrolled Diabetes type 2 with hyperglycemia.  Home medications include Lantus and oral agents. -hold lantus as fasting blood sugar low likely related to kidney function -holding Oral agents as appetite unreliable -Sliding scale insulin for optimal control -diabetic coordinator consult  appreciated  Chronic kidney disease/nephrotic syndrome -low albumin -3+ protein in urine -needs better blood sugar control as well as BP -was on losartan until Dec 2019-- held up on d/c for worsening kidney failure and hypotension-- but this was never re-started -nephrology consult appreciated  Anemia of CD -monitor closely -no sign of bleeding  Family Communication/Anticipated D/C date and plan/Code Status   DVT prophylaxis: coumadin Code Status: Full Code.  Family Communication:  Disposition Plan: needs continued diuresis and close labs monitoring  Medical Consultants:    Cards  renal     Subjective:  Anxious to go home  Objective:    Vitals:   01/28/19 1943 01/29/19 0520 01/29/19 0909 01/29/19 1319  BP: (!) 146/59 (!) 160/71 (!) 156/70 (!) 141/66  Pulse: 61 61 (!) 58 (!) 53  Resp: 18 18  15   Temp: 98.2 F (36.8 C) 98.3 F (36.8 C) 98.1 F (36.7 C) 97.7 F (36.5 C)  TempSrc: Oral Oral Oral Oral  SpO2: 97% 100% 100% 98%  Weight:  84.2 kg    Height:        Intake/Output Summary (Last 24 hours) at 01/29/2019 1339 Last data filed at 01/29/2019 0900 Gross per 24 hour  Intake 240 ml  Output 3200 ml  Net -2960 ml   Filed Weights   01/27/19 0116 01/28/19 0651 01/29/19 0520  Weight: 86.8 kg 85.8 kg 84.2 kg    Exam: In chair with legs hanging down rrr No increased work of breathing +BS, soft +LE edema  Data Reviewed:   I have personally reviewed following labs and imaging studies:  Labs: Labs show the following:   Basic Metabolic Panel: Recent Labs  Lab 01/23/19 1651  01/25/19  2694 01/26/19 0417 01/27/19 0648 01/28/19 0536 01/29/19 0744  NA  --    < > 138 137 137 137 136  K  --    < > 3.4* 3.7 3.7 3.6 3.9  CL  --    < > 107 104 104 103 100  CO2  --    < > 23 24 21* 26 26  GLUCOSE  --    < > 75 64* 84 61* 71  BUN  --    < > 28* 33* 35* 36* 36*  CREATININE  --    < > 2.95* 3.39* 3.52* 3.45* 3.54*  CALCIUM  --    < > 8.3* 8.6* 8.3*  8.3* 8.3*  MG 1.6*  --   --   --  1.9  --   --   PHOS  --   --   --   --   --  4.2  --    < > = values in this interval not displayed.   GFR Estimated Creatinine Clearance: 18 mL/min (A) (by C-G formula based on SCr of 3.54 mg/dL (H)). Liver Function Tests: Recent Labs  Lab 01/24/19 0624  AST 19  ALT 14  ALKPHOS 67  BILITOT 0.4  PROT 4.9*  ALBUMIN 2.0*   No results for input(s): LIPASE, AMYLASE in the last 168 hours. No results for input(s): AMMONIA in the last 168 hours. Coagulation profile Recent Labs  Lab 01/25/19 0635 01/26/19 0417 01/27/19 0648 01/28/19 0536 01/29/19 0744  INR 1.4* 2.0* 2.3* 2.3* 2.6*    CBC: Recent Labs  Lab 01/23/19 1248 01/25/19 0635 01/26/19 0417 01/29/19 0744  WBC 4.4 3.8* 3.7* 4.4  HGB 11.1* 10.8* 11.2* 9.9*  HCT 35.8* 33.9* 35.2* 31.1*  MCV 75.1* 73.1* 73.0* 72.5*  PLT 260 264 265 293   Cardiac Enzymes: Recent Labs  Lab 01/23/19 1248  TROPONINI 0.03*   BNP (last 3 results) No results for input(s): PROBNP in the last 8760 hours. CBG: Recent Labs  Lab 01/28/19 1705 01/28/19 2135 01/29/19 0613 01/29/19 0648 01/29/19 1102  GLUCAP 112* 115* 59* 90 129*   D-Dimer: No results for input(s): DDIMER in the last 72 hours. Hgb A1c: No results for input(s): HGBA1C in the last 72 hours. Lipid Profile: Recent Labs    01/28/19 0536  CHOL 217*  HDL 75  LDLCALC 130*  TRIG 59  CHOLHDL 2.9   Thyroid function studies: No results for input(s): TSH, T4TOTAL, T3FREE, THYROIDAB in the last 72 hours.  Invalid input(s): FREET3 Anemia work up: Recent Labs    01/27/19 0648  FERRITIN 23  TIBC 273  IRON 19*   Sepsis Labs: Recent Labs  Lab 01/23/19 1248 01/25/19 0635 01/26/19 0417 01/29/19 0744  WBC 4.4 3.8* 3.7* 4.4    Microbiology Recent Results (from the past 240 hour(s))  Urine Culture     Status: None   Collection Time: 01/23/19 10:53 AM   Specimen: Urine   UR  Result Value Ref Range Status   Urine Culture,  Routine Final report  Final   Organism ID, Bacteria Comment  Final    Comment: Mixed urogenital flora 10,000-25,000 colony forming units per mL   Novel Coronavirus,NAA,(SEND-OUT TO REF LAB - TAT 24-48 hrs); Hosp Order     Status: None   Collection Time: 01/23/19  2:54 PM   Specimen: Nasopharyngeal Swab; Respiratory  Result Value Ref Range Status   SARS-CoV-2, NAA NOT DETECTED NOT DETECTED Final    Comment: (NOTE) This test  was developed and its performance characteristics determined by [employer]. This test has not been FDA cleared or approved. This test has been authorized by FDA under an Emergency Use Authorization (EUA). This test is only authorized for the duration of time the declaration that circumstances exist justifying the authorization of the emergency use of in vitro diagnostic tests for detection of SARS-CoV-2 virus and/or diagnosis of COVID-19 infection under section 564(b)(1) of the Act, 21 U.S.C. 315XYV-8(P)(9), unless the authorization is terminated or revoked sooner. When diagnostic testing is negative, the possibility of a false negative result should be considered in the context of a patient's recent exposures and the presence of clinical signs and symptoms consistent with COVID-19. An individual without symptoms of COVID-19 and who is not shedding SARS-CoV-2 virus would expect to have a negative (not detected) result in this assay. Performed  At: [facility] [address] [place], [place] [id number] Rush Farmer MD NO:[id number]    [place]  Final    Comment: Performed at [facility], [place] [address], [place], [place] [address]    Procedures and diagnostic studies:  No results found.  Medications:   . atorvastatin  40 mg Oral q1800  . brimonidine  1 drop Left Eye BID  . carvedilol  25 mg Oral BID  . dorzolamide-timolol  1 drop Left Eye BID  . ferrous sulfate  325 mg Oral QODAY  . furosemide  80 mg  Intravenous BID  . hydrALAZINE  100 mg Oral TID  . insulin aspart  0-5 Units Subcutaneous QHS  . insulin aspart  0-9 Units Subcutaneous TID WC  . isosorbide dinitrate  20 mg Oral TID  . metolazone  2.5 mg Oral Daily  . potassium chloride  10 mEq Oral BID  . sodium chloride flush  3 mL Intravenous Q12H  . ticagrelor  90 mg Oral BID  . warfarin  5 mg Oral ONCE-1800  . Warfarin - Pharmacist Dosing Inpatient   Does not apply q1800   Continuous Infusions: . sodium chloride       LOS: 5 days   Geradine Girt  [facility]   How to contact the [facility] Attending or Consulting provider [place] or covering provider during after hours [place], for this patient?  1. Check the care team in [facility] and look for a) attending/consulting TRH provider listed and b) the [facility] team listed 2. Log into [email or web address] and use [place]'s universal password to access. If you do not have the password, please contact the hospital operator. 3. Locate the [facility] provider you are looking for under [facility] and page to a number that you can be directly reached. 4. If you still have difficulty reaching the provider, please page the [facility] (Director on Call) for the Hospitalists listed on amion for assistance.  01/29/2019, 1:39 PM

## 2019-01-29 NOTE — Progress Notes (Signed)
[place] for warfarin Indication: atrial fibrillation  Allergies  Allergen Reactions  . Ace Inhibitors Cough    Patient Measurements: Height: 5\' 6"  (167.6 cm) Weight: 185 lb 11.2 oz (84.2 kg)(scale b) IBW/kg (Calculated) : 59.3  Vital Signs: Temp: 98.1 F (36.7 C) (06/21 0909) Temp Source: Oral (06/21 0909) BP: 156/70 (06/21 0909) Pulse Rate: 58 (06/21 0909)  Labs: Recent Labs    01/27/19 0648 01/28/19 0536 01/29/19 0744  HGB  --   --  9.9*  HCT  --   --  31.1*  PLT  --   --  293  LABPROT 24.8* 24.9* 27.6*  INR 2.3* 2.3* 2.6*  CREATININE 3.52* 3.45* 3.54*    Estimated Creatinine Clearance: 18 mL/min (A) (by C-G formula based on SCr of 3.54 mg/dL (H)).   Medical History: Past Medical History:  Diagnosis Date  . Anemia   . Chronic combined systolic and diastolic CHF (congestive heart failure) ([place])   . CKD (chronic kidney disease), stage IV ([place])   . Coronary artery disease 08/2017   DES x 2 RCA  . GERD (gastroesophageal reflux disease)   . Heart murmur   . Hyperlipidemia   . Hypertension   . Proliferative diabetic retinopathy ([place])    left eye with vitreous hemorrhage and tractional retinal detachment  . Stroke ([place]) 09/2014   numbness left upper lip, finger tips on left hand; "resolved" (05/18/2016)  . Type II diabetes mellitus ([place])   . Wears glasses     Assessment: 73 yof presented to the ED with hypertension and dizziness. She is on chronic warfarin for history of afib. She also had recent cataract surgery. INR was subtherapeutic on admission.  INR remains therapeutic at 2.6 today. Hgb low at 9.9, plts ok. No reports of bleeding per RN.   PTA dosing 5mg  daily  Goal of Therapy:  INR 2-3 Monitor platelets by anticoagulation protocol: Yes   Plan:  -Warfarin 5mg  x1 again tonight -Daily PT/INR, CBC, s/sx bleeding    Juanell Fairly, PharmD PGY1 Pharmacy Resident 01/29/2019 9:36 AM **Pharmacist phone  directory can now be found on [email or web address] (PW [email or web address]).  Listed under [place].

## 2019-01-29 NOTE — Progress Notes (Signed)
Progress Note  Patient Name: Amber Young Date of Encounter: 01/29/2019  Primary Cardiologist: Kate Sable, MD   Subjective   Additional diuresis overnight with metolazone. BP remains elevated. Nephrology on board - appreciate their recs.  Inpatient Medications    Scheduled Meds: . atorvastatin  40 mg Oral q1800  . brimonidine  1 drop Left Eye BID  . carvedilol  25 mg Oral BID  . dorzolamide-timolol  1 drop Left Eye BID  . ferrous sulfate  325 mg Oral QODAY  . furosemide  80 mg Intravenous BID  . hydrALAZINE  100 mg Oral TID  . insulin aspart  0-5 Units Subcutaneous QHS  . insulin aspart  0-9 Units Subcutaneous TID WC  . isosorbide dinitrate  20 mg Oral TID  . metolazone  2.5 mg Oral Daily  . potassium chloride  10 mEq Oral BID  . sodium chloride flush  3 mL Intravenous Q12H  . ticagrelor  90 mg Oral BID  . warfarin  5 mg Oral ONCE-1800  . Warfarin - Pharmacist Dosing Inpatient   Does not apply q1800   Continuous Infusions: . sodium chloride     PRN Meds: sodium chloride, acetaminophen, ALPRAZolam, hydrALAZINE, nitroGLYCERIN, ondansetron (ZOFRAN) IV, sodium chloride flush   Vital Signs    Vitals:   01/28/19 1800 01/28/19 1943 01/29/19 0520 01/29/19 0909  BP: (!) 151/66 (!) 146/59 (!) 160/71 (!) 156/70  Pulse: (!) 58 61 61 (!) 58  Resp:  18 18   Temp: 97.8 F (36.6 C) 98.2 F (36.8 C) 98.3 F (36.8 C) 98.1 F (36.7 C)  TempSrc: Oral Oral Oral Oral  SpO2: 100% 97% 100% 100%  Weight:   84.2 kg   Height:        Intake/Output Summary (Last 24 hours) at 01/29/2019 1059 Last data filed at 01/29/2019 0900 Gross per 24 hour  Intake 360 ml  Output 3200 ml  Net -2840 ml   Filed Weights   01/27/19 0116 01/28/19 0651 01/29/19 0520  Weight: 86.8 kg 85.8 kg 84.2 kg    Telemetry    nsr - Personally Reviewed  ECG    none - Personally Reviewed  Physical Exam   General appearance: alert and no distress Lungs: clear to auscultation bilaterally  Heart: regular rate and rhythm Extremities: edema 2+ LE edema Neurologic: Grossly normal   Labs    Chemistry Recent Labs  Lab 01/24/19 0624  01/27/19 0648 01/28/19 0536 01/29/19 0744  NA  --    < > 137 137 136  K  --    < > 3.7 3.6 3.9  CL  --    < > 104 103 100  CO2  --    < > 21* 26 26  GLUCOSE  --    < > 84 61* 71  BUN  --    < > 35* 36* 36*  CREATININE  --    < > 3.52* 3.45* 3.54*  CALCIUM  --    < > 8.3* 8.3* 8.3*  PROT 4.9*  --   --   --   --   ALBUMIN 2.0*  --   --   --   --   AST 19  --   --   --   --   ALT 14  --   --   --   --   ALKPHOS 67  --   --   --   --   BILITOT 0.4  --   --   --   --  GFRNONAA  --    < > 13* 13* 13*  GFRAA  --    < > 15* 16* 15*  ANIONGAP  --    < > 12 8 10    < > = values in this interval not displayed.     Hematology Recent Labs  Lab 01/25/19 0635 01/26/19 0417 01/29/19 0744  WBC 3.8* 3.7* 4.4  RBC 4.64 4.82 4.29  HGB 10.8* 11.2* 9.9*  HCT 33.9* 35.2* 31.1*  MCV 73.1* 73.0* 72.5*  MCH 23.3* 23.2* 23.1*  MCHC 31.9 31.8 31.8  RDW 15.9* 15.9* 15.9*  PLT 264 265 293    Cardiac Enzymes Recent Labs  Lab 01/23/19 1248  TROPONINI 0.03*   No results for input(s): TROPIPOC in the last 168 hours.   BNP Recent Labs  Lab 01/23/19 1715  BNP 2,666.4*     DDimer No results for input(s): DDIMER in the last 168 hours.   Radiology    No results found.  Cardiac Studies   none  Patient Profile     63 y.o. female admitted with acute on chronic systolic and diastolic heart failure  Assessment & Plan    1. Acute on chronic systolic heart failure - her dyspnea is improved and weight is down about 5 lbs from admit. Diuresis is difficult in the setting of renal dysfunction. Diursed well overnight with lasix and metolazone. 2. HTN - her blood pressure is still elevated. Plan increase BB - may need CCB, however this could worsen LE edema. 3. PAF - she appears to be maintaining NSR.    For questions or updates, please contact  [place] Please consult [email or web address] for contact info under Cardiology/STEMI.   Pixie Casino, MD, [facility], [place] Director of the Advanced Lipid Disorders &  Cardiovascular Risk Reduction Clinic Diplomate of the American Board of Clinical Lipidology Attending Cardiologist  Direct Dial: [phone]  Fax: [phone]  Website:  [email or web address]  Pixie Casino, MD  01/29/2019, 10:59 AM

## 2019-01-29 NOTE — Progress Notes (Signed)
[facility]    NEPHROLOGY PROGRESS NOTE  SUBJECTIVE: Patient without complaint today.  Patient seen and examined.  Anxious to go home.  Patient denies chest pain, shortness of breath, nausea, vomiting, diarrhea or dysuria.  Has had some improvement in lower extremity edema overnight.  All other review of systems are negative.  Is anxious to go home.  Weight is down approximately 4 kg since admission.   OBJECTIVE:  Vitals:   01/29/19 0520 01/29/19 0909  BP: (!) 160/71 (!) 156/70  Pulse: 61 (!) 58  Resp: 18   Temp: 98.3 F (36.8 C) 98.1 F (36.7 C)  SpO2: 100% 100%    Intake/Output Summary (Last 24 hours) at 01/29/2019 1045 Last data filed at 01/29/2019 0900 Gross per 24 hour  Intake 360 ml  Output 3200 ml  Net -2840 ml      General:  AAOx3 NAD HEENT: MMM [place] AT anicteric sclera Neck:  No JVD, no adenopathy CV:  Heart RRR  Lungs:  L/S CTA bilaterally Abd:  abd SNT/ND with normal BS GU:  Bladder non-palpable Extremities: +3 bilateral LE edema. Skin:  No skin rash  MEDICATIONS:  . atorvastatin  40 mg Oral q1800  . brimonidine  1 drop Left Eye BID  . carvedilol  25 mg Oral BID  . dorzolamide-timolol  1 drop Left Eye BID  . ferrous sulfate  325 mg Oral QODAY  . furosemide  80 mg Intravenous BID  . hydrALAZINE  100 mg Oral TID  . insulin aspart  0-5 Units Subcutaneous QHS  . insulin aspart  0-9 Units Subcutaneous TID WC  . isosorbide dinitrate  20 mg Oral TID  . metolazone  2.5 mg Oral Daily  . potassium chloride  10 mEq Oral BID  . sodium chloride flush  3 mL Intravenous Q12H  . ticagrelor  90 mg Oral BID  . warfarin  5 mg Oral ONCE-1800  . Warfarin - Pharmacist Dosing Inpatient   Does not apply q1800       LABS:   CBC Latest Ref Rng & Units 01/29/2019 01/26/2019 01/25/2019  WBC 4.0 - 10.5 K/uL 4.4 3.7(L) 3.8(L)  Hemoglobin 12.0 - 15.0 g/dL 9.9(L) 11.2(L) 10.8(L)  Hematocrit 36.0 - 46.0 % 31.1(L) 35.2(L) 33.9(L)  Platelets 150 - 400 K/uL 293 265  264    CMP Latest Ref Rng & Units 01/29/2019 01/28/2019 01/27/2019  Glucose 70 - 99 mg/dL 71 61(L) 84  BUN 8 - 23 mg/dL 36(H) 36(H) 35(H)  Creatinine 0.44 - 1.00 mg/dL 3.54(H) 3.45(H) 3.52(H)  Sodium 135 - 145 mmol/L 136 137 137  Potassium 3.5 - 5.1 mmol/L 3.9 3.6 3.7  Chloride 98 - 111 mmol/L 100 103 104  CO2 22 - 32 mmol/L 26 26 21(L)  Calcium 8.9 - 10.3 mg/dL 8.3(L) 8.3(L) 8.3(L)  Total Protein 6.5 - 8.1 g/dL - - -  Total Bilirubin 0.3 - 1.2 mg/dL - - -  Alkaline Phos 38 - 126 U/L - - -  AST 15 - 41 U/L - - -  ALT 0 - 44 U/L - - -    Lab Results  Component Value Date   CALCIUM 8.3 (L) 01/29/2019   CAION 1.11 (L) 12/20/2016   PHOS 4.2 01/28/2019       Component Value Date/Time   COLORURINE YELLOW 07/21/2018 1303   APPEARANCEUR HAZY (A) 07/21/2018 1303   LABSPEC 1.013 07/21/2018 1303   PHURINE 6.0 07/21/2018 1303   GLUCOSEU 150 (A) 07/21/2018 1303   HGBUR SMALL (A)  07/21/2018 1303   BILIRUBINUR negative 01/23/2019 1034   BILIRUBINUR neg 12/15/2017 0929   KETONESUR negative 01/23/2019 [place] 07/21/2018 1303   PROTEINUR >=300 (A) 01/23/2019 1034   PROTEINUR >=300 (A) 07/21/2018 1303   UROBILINOGEN 0.2 01/23/2019 1034   UROBILINOGEN 0.2 10/25/2017 0910   NITRITE Negative 01/23/2019 1034   NITRITE NEGATIVE 07/21/2018 1303   LEUKOCYTESUR Negative 01/23/2019 1034      Component Value Date/Time   TCO2 26 12/20/2016 1605       Component Value Date/Time   IRON 19 (L) 01/27/2019 0648   TIBC 273 01/27/2019 0648   FERRITIN 23 01/27/2019 0648   IRONPCTSAT 7 (L) 01/27/2019 0648   IRONPCTSAT 13 05/24/2017 1057       ASSESSMENT/PLAN:     1.  Chronic kidney disease stage IV.  Her most recent baseline creatinine appears to be around 2.5.  This is likely on the basis of longstanding diabetes.  She has had persistent glucose in her urine and hemoglobin A1c's in the 10-11 range since March 2020.  She does have significant proteinuria, which we will quantify.  We  will also check an SPEP for completeness.  I suspect this is related to progressive diabetic nephropathy.  Tight diabetes control is prudent.  We will check an intact PTH.  Phosphorus stable. Is allergic to ACE inhibition.  2.  Nephrotic syndrome.  Will check a urine protein to creatinine ratio.  Check a lipid panel as well.  She had over 5 g of proteinuria in October 2019.  We will check an SPEP and ANA for completeness, which are pending.  3.  Chronic systolic congestive heart failure.  Continue diuretics.  Metolazone added yesterday.  Patient is anxious to go home.  May be able to send patient on furosemide 80 mg twice daily with metolazone 2.5 mg 3 times weekly.  Would need blood work in 1 week.  4.  Anemia of chronic kidney disease.  Hemoglobin is above goal.  5.  Diabetes mellitus.  Tight diabetes control is prudent.  6.  Hypertensive urgency.  Would uptitrate the dose of carvedilol.  Given proteinuria, non-dihydropyridine calcium channel blockers would be the next drug of choice due to allergy with ACE inhibition.  Blood pressures have improved.     [place], DO, [place]

## 2019-01-30 DIAGNOSIS — I25118 Atherosclerotic heart disease of native coronary artery with other forms of angina pectoris: Secondary | ICD-10-CM

## 2019-01-30 LAB — CBC
HCT: 35.2 % — ABNORMAL LOW (ref 36.0–46.0)
Hemoglobin: 11.1 g/dL — ABNORMAL LOW (ref 12.0–15.0)
MCH: 22.8 pg — ABNORMAL LOW (ref 26.0–34.0)
MCHC: 31.5 g/dL (ref 30.0–36.0)
MCV: 72.4 fL — ABNORMAL LOW (ref 80.0–100.0)
Platelets: 360 10*3/uL (ref 150–400)
RBC: 4.86 MIL/uL (ref 3.87–5.11)
RDW: 15.9 % — ABNORMAL HIGH (ref 11.5–15.5)
WBC: 4.3 10*3/uL (ref 4.0–10.5)
nRBC: 0 % (ref 0.0–0.2)

## 2019-01-30 LAB — PROTEIN ELECTROPHORESIS, SERUM
A/G Ratio: 0.9 (ref 0.7–1.7)
Albumin ELP: 2.4 g/dL — ABNORMAL LOW (ref 2.9–4.4)
Alpha-1-Globulin: 0.3 g/dL (ref 0.0–0.4)
Alpha-2-Globulin: 0.8 g/dL (ref 0.4–1.0)
Beta Globulin: 0.8 g/dL (ref 0.7–1.3)
Gamma Globulin: 0.7 g/dL (ref 0.4–1.8)
Globulin, Total: 2.6 g/dL (ref 2.2–3.9)
Total Protein ELP: 5 g/dL — ABNORMAL LOW (ref 6.0–8.5)

## 2019-01-30 LAB — BASIC METABOLIC PANEL
Anion gap: 13 (ref 5–15)
BUN: 39 mg/dL — ABNORMAL HIGH (ref 8–23)
CO2: 27 mmol/L (ref 22–32)
Calcium: 8.4 mg/dL — ABNORMAL LOW (ref 8.9–10.3)
Chloride: 97 mmol/L — ABNORMAL LOW (ref 98–111)
Creatinine, Ser: 3.63 mg/dL — ABNORMAL HIGH (ref 0.44–1.00)
GFR calc Af Amer: 15 mL/min — ABNORMAL LOW (ref >60)
GFR calc non Af Amer: 13 mL/min — ABNORMAL LOW (ref >60)
Glucose, Bld: 113 mg/dL — ABNORMAL HIGH (ref 70–99)
Potassium: 3.7 mmol/L (ref 3.5–5.1)
Sodium: 137 mmol/L (ref 135–145)

## 2019-01-30 LAB — ANA W/REFLEX IF POSITIVE: Anti Nuclear Antibody (ANA): NEGATIVE

## 2019-01-30 LAB — PROTIME-INR
INR: 3 — ABNORMAL HIGH (ref 0.8–1.2)
Prothrombin Time: 30.8 seconds — ABNORMAL HIGH (ref 11.4–15.2)

## 2019-01-30 LAB — GLUCOSE, CAPILLARY
Glucose-Capillary: 115 mg/dL — ABNORMAL HIGH (ref 70–99)
Glucose-Capillary: 111 mg/dL — ABNORMAL HIGH (ref 70–99)

## 2019-01-30 LAB — PARATHYROID HORMONE, INTACT (NO CA): PTH: 236 pg/mL — ABNORMAL HIGH (ref 15–65)

## 2019-01-30 MED ORDER — FUROSEMIDE 80 MG PO TABS
80.0000 mg | ORAL_TABLET | Freq: Two times a day (BID) | ORAL | Status: DC
  Filled 2019-01-30: qty 1

## 2019-01-30 MED ORDER — METOLAZONE 2.5 MG PO TABS: 2.5000 mg | ORAL_TABLET | ORAL | 0 refills | Status: DC

## 2019-01-30 MED ORDER — CARVEDILOL 25 MG PO TABS: 25.0000 mg | ORAL_TABLET | Freq: Two times a day (BID) | ORAL | 0 refills | Status: DC

## 2019-01-30 MED ORDER — HYDRALAZINE HCL 100 MG PO TABS: 100.0000 mg | ORAL_TABLET | Freq: Three times a day (TID) | ORAL | 0 refills | Status: DC

## 2019-01-30 MED ORDER — FUROSEMIDE 80 MG PO TABS: 80.0000 mg | ORAL_TABLET | Freq: Two times a day (BID) | ORAL | 0 refills | Status: DC

## 2019-01-30 MED ORDER — GLIMEPIRIDE 1 MG PO TABS: 1.0000 mg | ORAL_TABLET | Freq: Every day | ORAL | 0 refills | Status: DC

## 2019-01-30 MED ORDER — WARFARIN SODIUM 2.5 MG PO TABS
2.5000 mg | ORAL_TABLET | Freq: Once | ORAL | Status: DC
  Filled 2019-01-30: qty 1

## 2019-01-30 MED ORDER — WARFARIN SODIUM 2.5 MG PO TABS: 2.5000 mg | ORAL_TABLET | Freq: Every day | ORAL | 0 refills | Status: DC

## 2019-01-30 MED ORDER — FUROSEMIDE 10 MG/ML IJ SOLN: 40.0000 mg | Freq: Two times a day (BID) | INTRAMUSCULAR | Status: DC

## 2019-01-30 MED ORDER — ISOSORBIDE DINITRATE 20 MG PO TABS: 20.0000 mg | ORAL_TABLET | Freq: Three times a day (TID) | ORAL | 0 refills | Status: DC

## 2019-01-30 MED ORDER — METOLAZONE 2.5 MG PO TABS: 2.5000 mg | ORAL_TABLET | ORAL | Status: DC

## 2019-01-30 MED FILL — ISOSORBIDE DN 20 MG TABLET: 20 | 30 days supply | Qty: 90 | Fill #0

## 2019-01-30 MED FILL — metOLazone 2.5 MG TABS: 2.5 | 30 days supply | Qty: 30 | Fill #0

## 2019-01-30 MED FILL — hydrALAZINE HCL 100 MG TABS: 100 | 30 days supply | Qty: 90 | Fill #0

## 2019-01-30 MED FILL — GLIMEPIRIDE 1 MG TABLET: 1 | 30 days supply | Qty: 30 | Fill #0

## 2019-01-30 MED FILL — CARVEDILOL 25 MG TABLET: 25 | 30 days supply | Qty: 60 | Fill #0

## 2019-01-30 MED FILL — WARFARIN NA 2.5 MG TAB: 2.5 | 15 days supply | Qty: 15 | Fill #0

## 2019-01-30 NOTE — Care Management Important Message (Signed)
Important Message  Patient Details  Name: Amber Young MRN: [id number] Date of Birth: 10-25-1955   Medicare Important Message Given:  Yes     Shelda Altes 01/30/2019, 12:51 PM

## 2019-01-30 NOTE — Discharge Summary (Signed)
Physician Discharge Summary  Jaylen Claude IWP:[id number] DOB: 01/30/1956 DOA: 01/23/2019  PCP: Azzie Glatter, FNP  Admit date: 01/23/2019 Discharge date: 01/30/2019  Admitted From: home Discharge disposition: home   Recommendations for Outpatient Follow-Up:   1. Needs close follow up on Cr- BMP w/in 1 week 2. INR w/in 1 week 3. Titrate diabetic medications 4. TED hose 5. Daily weights   Discharge Diagnosis:   Principal Problem:   Acute on chronic combined systolic (congestive) and diastolic (congestive) heart failure (HCC) Active Problems:   Uncontrolled diabetes mellitus (HCC)   Hypokalemia   PAF (paroxysmal atrial fibrillation) (HCC)   CKD (chronic kidney disease), stage IV (HCC)   Hypertensive urgency   Essential hypertension   Acute on chronic heart failure ([place])    Discharge Condition: Improved.  Diet recommendation: Low sodium, heart healthy.  Carbohydrate-modified  Wound care: None.  Code status: Full.   History of Present Illness:   Amber Young is a 63 y.o. female with a Past Medical History of DM; CVA; uncontrolled HTN; HLD; CAD; stage 3 CKD; and systolic CHF with EF 50-53% on echo in 12/19 who presents with increased LE edema and SOB concerning for recurrent CHF.  She also has HTN urgency with BP about 200/120 - but she reports that this is chronic for her.  As noted by NP Black, will observe for now with IV Lasix diuresis, repeat Echo, cardiology consult, and improved BP management.  Patient's compliance with medications and dietary recommendations is suspect.   Hospital Course by Problem:   Acute on chronic combined systolic and diastolic heart failure. Likely multifactorial specifically missing doses of Lasix dietary indiscretion uncontrolled blood pressure. Echo in December 2019 reveals an EF of 40% severe anteroseptal apical akinesis. -Repeat echo shows diffuse hypokinesis with EF 25-30 -Cardiology consult appreciated-  outpatient follow up-- if Cr improves or patient on HD, may need cath -Daily weights- trending down -Intake and output-- down 6L -lasix plus 3x week metolazone -encouraged patient to be compliant with diet (like salt)  Hypertensive urgency.  - BP improved on regimen below  Paroxysmal A. fib.  -chadvasc score 4.  -continue home meds including warfarin  Hypokalemia.  -Repleted along with  magnesium  Uncontrolled Diabetes type 2 with hyperglycemia. Home medications include Lantus and oral agents. -hold lantus as fasting blood sugar low likely related to kidney function -spoke with diabetic coordinator-- blood sugars low in AM -- will do 1 mg of glipizide--- if patient does not follow correct diet or Cr improved, will need to titrate/add medications  Chronic kidney disease stage IV/nephrotic syndrome -low albumin -3+ protein in urine -needs better blood sugar control as well as BP -was on losartan until Dec 2019-- held up on d/c for worsening kidney failure and hypotension-- but this was never re-started -nephrology consult appreciated  Anemia of CD -monitor closely -no sign of bleeding    Medical Consultants:    Renal cardiology  Discharge Exam:   Vitals:   01/30/19 0833 01/30/19 1158  BP: (!) 159/65 (!) 146/57  Pulse: 70 (!) 56  Resp: 16 18  Temp: 97.6 F (36.4 C) 97.7 F (36.5 C)  SpO2: 95% 99%   Vitals:   01/30/19 0359 01/30/19 0403 01/30/19 0833 01/30/19 1158  BP:  (!) 165/81 (!) 159/65 (!) 146/57  Pulse:  70 70 (!) 56  Resp:  19 16 18   Temp:  98.6 F (37 C) 97.6 F (36.4 C) 97.7 F (36.5 C)  TempSrc:  Oral Oral Oral  SpO2:  99% 95% 99%  Weight: 82.9 kg     Height:        General exam: Appears calm and comfortable.some LE edema still present  The results of significant diagnostics from this hospitalization (including imaging, microbiology, ancillary and laboratory) are listed below for reference.     Procedures and Diagnostic  Studies:   Dg Chest Port 1 View  Result Date: 01/23/2019 CLINICAL DATA:  Congestive heart failure. Swelling of the lower extremities. EXAM: PORTABLE CHEST 1 VIEW COMPARISON:  07/20/2018 FINDINGS: There is cardiomegaly. There is pulmonary venous hypertension without frank edema. No effusions. No acute bone finding. Old healed left proximal humeral fracture. IMPRESSION: Cardiomegaly and pulmonary venous hypertension. No frank edema or effusion. Electronically Signed   By: Nelson Chimes M.D.   On: 01/23/2019 13:32     Labs:   Basic Metabolic Panel: Recent Labs  Lab 01/23/19 1651  01/26/19 0417 01/27/19 0648 01/28/19 0536 01/29/19 0744 01/30/19 0636  NA  --    < > 137 137 137 136 137  K  --    < > 3.7 3.7 3.6 3.9 3.7  CL  --    < > 104 104 103 100 97*  CO2  --    < > 24 21* 26 26 27   GLUCOSE  --    < > 64* 84 61* 71 113*  BUN  --    < > 33* 35* 36* 36* 39*  CREATININE  --    < > 3.39* 3.52* 3.45* 3.54* 3.63*  CALCIUM  --    < > 8.6* 8.3* 8.3* 8.3* 8.4*  MG 1.6*  --   --  1.9  --   --   --   PHOS  --   --   --   --  4.2  --   --    < > = values in this interval not displayed.   GFR Estimated Creatinine Clearance: 17.4 mL/min (A) (by C-G formula based on SCr of 3.63 mg/dL (H)). Liver Function Tests: Recent Labs  Lab 01/24/19 0624  AST 19  ALT 14  ALKPHOS 67  BILITOT 0.4  PROT 4.9*  ALBUMIN 2.0*   No results for input(s): LIPASE, AMYLASE in the last 168 hours. No results for input(s): AMMONIA in the last 168 hours. Coagulation profile Recent Labs  Lab 01/26/19 0417 01/27/19 0648 01/28/19 0536 01/29/19 0744 01/30/19 0636  INR 2.0* 2.3* 2.3* 2.6* 3.0*    CBC: Recent Labs  Lab 01/25/19 0635 01/26/19 0417 01/29/19 0744 01/30/19 0636  WBC 3.8* 3.7* 4.4 4.3  HGB 10.8* 11.2* 9.9* 11.1*  HCT 33.9* 35.2* 31.1* 35.2*  MCV 73.1* 73.0* 72.5* 72.4*  PLT 264 265 293 360   Cardiac Enzymes: No results for input(s): CKTOTAL, CKMB, CKMBINDEX, TROPONINI in the last 168  hours. BNP: Invalid input(s): POCBNP CBG: Recent Labs  Lab 01/29/19 1102 01/29/19 1637 01/29/19 2217 01/30/19 0621 01/30/19 1115  GLUCAP 129* 100* 212* 115* 111*   D-Dimer No results for input(s): DDIMER in the last 72 hours. Hgb A1c No results for input(s): HGBA1C in the last 72 hours. Lipid Profile Recent Labs    01/28/19 0536  CHOL 217*  HDL 75  LDLCALC 130*  TRIG 59  CHOLHDL 2.9   Thyroid function studies No results for input(s): TSH, T4TOTAL, T3FREE, THYROIDAB in the last 72 hours.  Invalid input(s): FREET3 Anemia work up No results for input(s): VITAMINB12, FOLATE, FERRITIN, TIBC, IRON, RETICCTPCT in  the last 72 hours. Microbiology Recent Results (from the past 240 hour(s))  Urine Culture     Status: None   Collection Time: 01/23/19 10:53 AM   Specimen: Urine   UR  Result Value Ref Range Status   Urine Culture, Routine Final report  Final   Organism ID, Bacteria Comment  Final    Comment: Mixed urogenital flora 10,000-25,000 colony forming units per mL   Novel Coronavirus,NAA,(SEND-OUT TO REF LAB - TAT 24-48 hrs); Hosp Order     Status: None   Collection Time: 01/23/19  2:54 PM   Specimen: Nasopharyngeal Swab; Respiratory  Result Value Ref Range Status   SARS-CoV-2, NAA NOT DETECTED NOT DETECTED Final    Comment: (NOTE) This test was developed and its performance characteristics determined by [employer]. This test has not been FDA cleared or approved. This test has been authorized by FDA under an Emergency Use Authorization (EUA). This test is only authorized for the duration of time the declaration that circumstances exist justifying the authorization of the emergency use of in vitro diagnostic tests for detection of SARS-CoV-2 virus and/or diagnosis of COVID-19 infection under section 564(b)(1) of the Act, 21 U.S.C. 981XBJ-4(N)(8), unless the authorization is terminated or revoked sooner. When diagnostic testing is negative, the  possibility of a false negative result should be considered in the context of a patient's recent exposures and the presence of clinical signs and symptoms consistent with COVID-19. An individual without symptoms of COVID-19 and who is not shedding SARS-CoV-2 virus would expect to have a negative (not detected) result in this assay. Performed  At: [facility] [address] [place], [place] [id number] Rush Farmer MD MV:[id number]    [place]  Final    Comment: Performed at [facility], [place] [address], [place], [place] [address]     Discharge Instructions:   Discharge Instructions    (HEART FAILURE PATIENTS) Call MD:  Anytime you have any of the following symptoms: 1) 3 pound weight gain in 24 hours or 5 pounds in 1 week 2) shortness of breath, with or without a dry hacking cough 3) swelling in the hands, feet or stomach 4) if you have to sleep on extra pillows at night in order to breathe.   Complete by: As directed    Diet - low sodium heart healthy   Complete by: As directed    Diet Carb Modified   Complete by: As directed    Discharge instructions   Complete by: As directed    Keep log of blood sugars Avoid salt   Increase activity slowly   Complete by: As directed      Allergies as of 01/30/2019      Reactions   Ace Inhibitors Cough      Medication List    STOP taking these medications   amLODipine 10 MG tablet Commonly known as: NORVASC   glipiZIDE 10 MG tablet Commonly known as: GLUCOTROL   hydrochlorothiazide 25 MG tablet Commonly known as: HYDRODIURIL   insulin glargine 100 UNIT/ML injection Commonly known as: Lantus     TAKE these medications   acetaminophen 500 MG tablet Commonly known as: TYLENOL Take 1,000 mg by mouth every 6 (six) hours as needed for mild pain or headache.   atorvastatin 40 MG tablet Commonly known as: LIPITOR Take 1 tablet (40 mg total) by mouth daily at 6 PM.   BLOOD GLUCOSE TEST  STRIPS Strp Use as directed   brimonidine 0.2 % ophthalmic  solution Commonly known as: ALPHAGAN Place 1 drop into the left eye 2 (two) times a day.   carvedilol 25 MG tablet Commonly known as: COREG Take 1 tablet (25 mg total) by mouth 2 (two) times daily. What changed: how much to take   dorzolamide-timolol 22.3-6.8 MG/ML ophthalmic solution Commonly known as: COSOPT Place 1 drop into the left eye 2 (two) times daily.   ferrous sulfate 325 (65 FE) MG tablet Commonly known as: FerrouSul Take 1 tablet (325 mg total) by mouth every other day.   furosemide 80 MG tablet Commonly known as: LASIX Take 1 tablet (80 mg total) by mouth 2 (two) times daily. What changed:   medication strength  how much to take   glimepiride 1 MG tablet Commonly known as: Amaryl Take 1 tablet (1 mg total) by mouth daily with breakfast. What changed:   medication strength  how much to take   hydrALAZINE 100 MG tablet Commonly known as: APRESOLINE Take 1 tablet (100 mg total) by mouth 3 (three) times daily. What changed:   medication strength  how much to take   Insulin Pen Needle 29G X 10MM Misc Use as directed   TRUEplus Pen Needles 31G X 6 MM Misc Generic drug: Insulin Pen Needle USE AS DIRECTED   INSULIN SYRINGE .5CC/29G 29G X 1/2" 0.5 ML Misc Use to administer insulin three times daily   isosorbide dinitrate 20 MG tablet Commonly known as: ISORDIL Take 1 tablet (20 mg total) by mouth 3 (three) times daily.   metolazone 2.5 MG tablet Commonly known as: ZAROXOLYN Take 1 tablet (2.5 mg total) by mouth 3 (three) times a week. Start taking on: February 01, 2019   nitroGLYCERIN 0.4 MG SL tablet Commonly known as: NITROSTAT Place 1 tablet (0.4 mg total) under the tongue every 5 (five) minutes x 3 doses as needed for chest pain.   potassium chloride 10 MEQ tablet Commonly known as: K-DUR Take 1 tablet with each dose of furosemide, daily. What changed:   how much to take  how  to take this  when to take this   ticagrelor 90 MG Tabs tablet Commonly known as: BRILINTA Take 1 tablet (90 mg total) by mouth 2 (two) times daily.   warfarin 2.5 MG tablet Commonly known as: COUMADIN Take as directed. If you are unsure how to take this medication, talk to your nurse or doctor. Original instructions: Take 1 tablet (2.5 mg total) by mouth daily at 6 PM. What changed:   medication strength  how much to take  how to take this  when to take this  additional instructions      Follow-up Information    Azzie Glatter, FNP Follow up in 1 week(s).   Specialty: Family Medicine Why: may need titration of her diabetic medications BMP on Thursday INR on Thursday Contact information: [place] [address] [phone]        Herminio Commons, MD .   Specialty: Cardiology Contact information: [place] [address] [phone]        Finnigan, Davie, DO Follow up.   Specialty: Nephrology Contact information: [place] [address] [phone]            Time coordinating discharge: 35 min  Signed:  Geradine Girt DO  [facility] 01/30/2019, 1:10 PM

## 2019-01-30 NOTE — Plan of Care (Signed)
  Problem: Education: Goal: Ability to demonstrate management of disease process will improve 01/30/2019 1337 by Arlina Robes, RN Outcome: Adequate for Discharge 01/30/2019 1335 by Arlina Robes, RN Outcome: Adequate for Discharge 01/30/2019 1058 by Arlina Robes, RN Outcome: Progressing Goal: Ability to verbalize understanding of medication therapies will improve 01/30/2019 1337 by Arlina Robes, RN Outcome: Adequate for Discharge 01/30/2019 1335 by Arlina Robes, RN Outcome: Adequate for Discharge 01/30/2019 1058 by Arlina Robes, RN Outcome: Progressing Goal: Individualized Educational Video(s) 01/30/2019 1337 by Arlina Robes, RN Outcome: Adequate for Discharge 01/30/2019 1335 by Arlina Robes, RN Outcome: Adequate for Discharge 01/30/2019 1058 by Arlina Robes, RN Outcome: Progressing

## 2019-01-30 NOTE — Plan of Care (Signed)
°  Problem: Education: °Goal: Ability to demonstrate management of disease process will improve °Outcome: Progressing °Goal: Ability to verbalize understanding of medication therapies will improve °Outcome: Progressing °Goal: Individualized Educational Video(s) °Outcome: Progressing °  °

## 2019-01-30 NOTE — Progress Notes (Signed)
[place] for warfarin Indication: atrial fibrillation  Allergies  Allergen Reactions  . Ace Inhibitors Cough    Patient Measurements: Height: 5\' 6"  (167.6 cm) Weight: 182 lb 12.8 oz (82.9 kg)(scale b) IBW/kg (Calculated) : 59.3  Vital Signs: Temp: 97.6 F (36.4 C) (06/22 0833) Temp Source: Oral (06/22 0833) BP: 159/65 (06/22 0833) Pulse Rate: 70 (06/22 0833)  Labs: Recent Labs    01/28/19 0536 01/29/19 0744 01/30/19 0636  HGB  --  9.9* 11.1*  HCT  --  31.1* 35.2*  PLT  --  293 360  LABPROT 24.9* 27.6* 30.8*  INR 2.3* 2.6* 3.0*  CREATININE 3.45* 3.54* 3.63*    Estimated Creatinine Clearance: 17.4 mL/min (A) (by C-G formula based on SCr of 3.63 mg/dL (H)).   Medical History: Past Medical History:  Diagnosis Date  . Anemia   . Chronic combined systolic and diastolic CHF (congestive heart failure) ([place])   . CKD (chronic kidney disease), stage IV ([place])   . Coronary artery disease 08/2017   DES x 2 RCA  . GERD (gastroesophageal reflux disease)   . Heart murmur   . Hyperlipidemia   . Hypertension   . Proliferative diabetic retinopathy ([place])    left eye with vitreous hemorrhage and tractional retinal detachment  . Stroke ([place]) 09/2014   numbness left upper lip, finger tips on left hand; "resolved" (05/18/2016)  . Type II diabetes mellitus ([place])   . Wears glasses     Assessment: 35 yof presented to the ED with hypertension and dizziness. She is on chronic warfarin for history of afib. She also had recent cataract surgery. INR was subtherapeutic on admission.  INR 3  PTA dosing 5mg  daily  Goal of Therapy:  INR 2-3 Monitor platelets by anticoagulation protocol: Yes   Plan:  -Warfarin 2.5 mg po x 1 today -Daily PT/INR, CBC, s/sx bleeding   Thank you Anette Guarneri, PharmD [phone]  01/30/2019 9:44 AM **Pharmacist phone directory can now be found on [email or web address] ([email or web address]).  Listed under [place].

## 2019-01-30 NOTE — Plan of Care (Signed)
  Problem: Education: Goal: Ability to demonstrate management of disease process will improve 01/30/2019 1335 by Arlina Robes, RN Outcome: Adequate for Discharge 01/30/2019 1058 by Arlina Robes, RN Outcome: Progressing Goal: Ability to verbalize understanding of medication therapies will improve 01/30/2019 1335 by Arlina Robes, RN Outcome: Adequate for Discharge 01/30/2019 1058 by Arlina Robes, RN Outcome: Progressing Goal: Individualized Educational Video(s) 01/30/2019 1335 by Arlina Robes, RN Outcome: Adequate for Discharge 01/30/2019 1058 by Arlina Robes, RN Outcome: Progressing

## 2019-01-30 NOTE — Plan of Care (Signed)
  Problem: Activity: Goal: Capacity to carry out activities will improve Outcome: Progressing   Problem: Activity: Goal: Risk for activity intolerance will decrease Outcome: Progressing   

## 2019-01-30 NOTE — Progress Notes (Signed)
Progress Note  Patient Name: Amber Young Date of Encounter: 01/30/2019  Primary Cardiologist: Kate Sable, MD   Subjective   No chest discomfort.  Shortness of breath improved.  Inpatient Medications    Scheduled Meds: . atorvastatin  40 mg Oral q1800  . brimonidine  1 drop Left Eye BID  . carvedilol  25 mg Oral BID  . dorzolamide-timolol  1 drop Left Eye BID  . ferrous sulfate  325 mg Oral QODAY  . furosemide  80 mg Intravenous BID  . hydrALAZINE  100 mg Oral TID  . insulin aspart  0-5 Units Subcutaneous QHS  . insulin aspart  0-9 Units Subcutaneous TID WC  . isosorbide dinitrate  20 mg Oral TID  . potassium chloride  10 mEq Oral BID  . sodium chloride flush  3 mL Intravenous Q12H  . ticagrelor  90 mg Oral BID  . warfarin  2.5 mg Oral ONCE-1800  . Warfarin - Pharmacist Dosing Inpatient   Does not apply q1800   Continuous Infusions: . sodium chloride     PRN Meds: sodium chloride, acetaminophen, ALPRAZolam, hydrALAZINE, nitroGLYCERIN, ondansetron (ZOFRAN) IV, sodium chloride flush   Vital Signs    Vitals:   01/29/19 2103 01/30/19 0359 01/30/19 0403 01/30/19 0833  BP: (!) 168/66  (!) 165/81 (!) 159/65  Pulse: 60  70 70  Resp: 19  19 16   Temp: 98.2 F (36.8 C)  98.6 F (37 C) 97.6 F (36.4 C)  TempSrc: Oral  Oral Oral  SpO2: 100%  99% 95%  Weight:  82.9 kg    Height:        Intake/Output Summary (Last 24 hours) at 01/30/2019 0954 Last data filed at 01/30/2019 0857 Gross per 24 hour  Intake 600 ml  Output 651 ml  Net -51 ml   Last 3 Weights 01/30/2019 01/29/2019 01/28/2019  Weight (lbs) 182 lb 12.8 oz 185 lb 11.2 oz 189 lb 3.2 oz  Weight (kg) 82.918 kg 84.233 kg 85.821 kg      Telemetry    Normal sinus rhythm- Personally Reviewed  ECG      Physical Exam   GEN: No acute distress.  Drowsy Neck: No JVD Cardiac: RRR, no murmurs, rubs, or gallops.  Respiratory: Clear to auscultation bilaterally. GI: Soft, nontender, non-distended  MS:   Bilateral lower extremity pitting edema; No deformity. Neuro:  Nonfocal  Psych: Normal affect   Labs    Chemistry Recent Labs  Lab 01/24/19 0624  01/28/19 0536 01/29/19 0744 01/30/19 0636  NA  --    < > 137 136 137  K  --    < > 3.6 3.9 3.7  CL  --    < > 103 100 97*  CO2  --    < > 26 26 27   GLUCOSE  --    < > 61* 71 113*  BUN  --    < > 36* 36* 39*  CREATININE  --    < > 3.45* 3.54* 3.63*  CALCIUM  --    < > 8.3* 8.3* 8.4*  PROT 4.9*  --   --   --   --   ALBUMIN 2.0*  --   --   --   --   AST 19  --   --   --   --   ALT 14  --   --   --   --   ALKPHOS 67  --   --   --   --  BILITOT 0.4  --   --   --   --   GFRNONAA  --    < > 13* 13* 13*  GFRAA  --    < > 16* 15* 15*  ANIONGAP  --    < > 8 10 13    < > = values in this interval not displayed.     Hematology Recent Labs  Lab 01/26/19 0417 01/29/19 0744 01/30/19 0636  WBC 3.7* 4.4 4.3  RBC 4.82 4.29 4.86  HGB 11.2* 9.9* 11.1*  HCT 35.2* 31.1* 35.2*  MCV 73.0* 72.5* 72.4*  MCH 23.2* 23.1* 22.8*  MCHC 31.8 31.8 31.5  RDW 15.9* 15.9* 15.9*  PLT 265 293 360    Cardiac Enzymes Recent Labs  Lab 01/23/19 1248  TROPONINI 0.03*   No results for input(s): TROPIPOC in the last 168 hours.   BNP Recent Labs  Lab 01/23/19 1715  BNP 2,666.4*     DDimer No results for input(s): DDIMER in the last 168 hours.   Radiology    No results found.  Cardiac Studies   1/19 cath: DES to RCA. EF 45%; EF 25% in 6/20 by echo  Patient Profile     63 y.o. female with new systolic heart failure  Assessment & Plan    1) Acute systolic heart failure: Unclear etiology.  Would normally like to perform left heart cath given her history of CAD.  Troponin not in the pattern of ACS.  Given her renal dysfunction, we cannot perform diagnostic cath at this time.  Will follow renal function and see if this improves.  Once this is trending back to baseline readings of 2.5, will consider cath.  Continue with diuresis.  Will decrease  dosage of IV diuresis to 40 mg twice daily given that her creatinine continues to rise.  Nephrology note from yesterday reviewed.  They suggested a dose of 80 mg twice daily Lasix with metolazone at the time of discharge.     For questions or updates, please contact [place] Please consult [email or web address] for contact info under        Signed, Larae Grooms, MD  01/30/2019, 9:54 AM

## 2019-02-06 ENCOUNTER — Other Ambulatory Visit: Payer: Self-pay

## 2019-02-06 ENCOUNTER — Encounter: Payer: Self-pay | Admitting: Family Medicine

## 2019-02-06 ENCOUNTER — Ambulatory Visit (INDEPENDENT_AMBULATORY_CARE_PROVIDER_SITE_OTHER): Payer: Medicare Other | Admitting: Family Medicine

## 2019-02-06 VITALS — BP 160/58 | HR 60 | Ht 66.0 in | Wt 167.0 lb

## 2019-02-06 DIAGNOSIS — R7309 Other abnormal glucose: Secondary | ICD-10-CM | POA: Diagnosis not present

## 2019-02-06 DIAGNOSIS — Z09 Encounter for follow-up examination after completed treatment for conditions other than malignant neoplasm: Secondary | ICD-10-CM

## 2019-02-06 DIAGNOSIS — H547 Unspecified visual loss: Secondary | ICD-10-CM

## 2019-02-06 DIAGNOSIS — E1159 Type 2 diabetes mellitus with other circulatory complications: Secondary | ICD-10-CM | POA: Diagnosis not present

## 2019-02-06 DIAGNOSIS — H269 Unspecified cataract: Secondary | ICD-10-CM

## 2019-02-06 DIAGNOSIS — R609 Edema, unspecified: Secondary | ICD-10-CM | POA: Diagnosis not present

## 2019-02-06 DIAGNOSIS — R42 Dizziness and giddiness: Secondary | ICD-10-CM

## 2019-02-06 DIAGNOSIS — R600 Localized edema: Secondary | ICD-10-CM

## 2019-02-06 DIAGNOSIS — I10 Essential (primary) hypertension: Secondary | ICD-10-CM | POA: Diagnosis not present

## 2019-02-06 DIAGNOSIS — R030 Elevated blood-pressure reading, without diagnosis of hypertension: Secondary | ICD-10-CM | POA: Diagnosis not present

## 2019-02-06 DIAGNOSIS — E11311 Type 2 diabetes mellitus with unspecified diabetic retinopathy with macular edema: Secondary | ICD-10-CM | POA: Diagnosis not present

## 2019-02-06 LAB — GLUCOSE, POCT (MANUAL RESULT ENTRY): POC Glucose: 171 mg/dl — AB (ref 70–99)

## 2019-02-06 MED ORDER — GLIPIZIDE 10 MG PO TABS: 10.0000 mg | ORAL_TABLET | Freq: Two times a day (BID) | ORAL | 3 refills | Status: DC

## 2019-02-06 MED ORDER — METFORMIN HCL 500 MG PO TABS: 500.0000 mg | ORAL_TABLET | Freq: Two times a day (BID) | ORAL | 3 refills | Status: DC

## 2019-02-06 MED ORDER — ATORVASTATIN CALCIUM 40 MG PO TABS: 40.0000 mg | ORAL_TABLET | Freq: Every day | ORAL | 1 refills | Status: DC

## 2019-02-06 MED FILL — metFORMIN HCL 500 MG TABS: 500 | 90 days supply | Qty: 180 | Fill #0

## 2019-02-06 MED FILL — ATORVASTATIN CALCIUM 40 MG: 40 | 90 days supply | Qty: 90 | Fill #0

## 2019-02-06 NOTE — Progress Notes (Signed)
Patient Amber Young Follow Up  Subjective:  Patient ID: Amber Young, female    DOB: [birth year]  Age: 63 y.o. MRN: [id number]  CC:  Chief Complaint  Patient presents with  . Hospitalization Follow-up    HPI [name] is a 63 year old who presents for Young Follow Up.   Past Medical History:  Diagnosis Date  . Anemia   . Chronic combined systolic and diastolic CHF (congestive heart failure) ([place])   . CKD (chronic kidney disease), stage IV ([place])   . Coronary artery disease 08/2017   DES x 2 RCA  . GERD (gastroesophageal reflux disease)   . Heart murmur   . Hyperlipidemia   . Hypertension   . Proliferative diabetic retinopathy ([place])    left eye with vitreous hemorrhage and tractional retinal detachment  . Stroke ([place]) 09/2014   numbness left upper lip, finger tips on left hand; "resolved" (05/18/2016)  . Type II diabetes mellitus ([place])   . Wears glasses    Current Status: Since her last office visit on 01/23/2019-01/30/2019 for Elevated blood pressure. Today, she is doing well with no complaints. She has follow up appointment with Cardiologist on 02/16/2019. She is accompanied by her daughter today. She denies visual changes, chest pain, cough, shortness of breath, heart palpitations, and falls. She has occasional headaches and dizziness with position changes. Denies severe headaches, confusion, seizures, double vision, and blurred vision, nausea and vomiting. She is not currently checking her blood glucose levels. She admits to fatigue, frequent urination, blurred vision, excessive hunger, and excessive thirst. She denies weight gain, weight loss, and poor wound healing. She continues to check her feet regularly. Her anxiety is mild today. She denies suicidal ideations, homicidal ideations, or auditory hallucinations.  She denies fevers, chills, fatigue, recent infections, weight loss, and night sweats. No reports of GI  problems such as diarrhea, and constipation. She has no reports of blood in stools, dysuria and hematuria. She denies pain today.   Past Surgical History:  Procedure Laterality Date  . CARDIAC CATHETERIZATION N/A 05/18/2016   Procedure: Left Heart Cath and Coronary Angiography;  Surgeon: Jettie Booze, MD;  Location: [facility];  Service: Cardiovascular;  Laterality: N/A;  . CARDIAC CATHETERIZATION N/A 05/18/2016   Procedure: Coronary Stent Intervention;  Surgeon: Jettie Booze, MD;  Location: [facility];  Service: Cardiovascular;  Laterality: N/A;  . COLONOSCOPY W/ BIOPSIES AND POLYPECTOMY    . CORONARY ANGIOPLASTY    . CORONARY ANGIOPLASTY WITH STENT PLACEMENT    . CORONARY STENT INTERVENTION N/A 09/07/2017   Procedure: CORONARY STENT INTERVENTION;  Surgeon: Leonie Man, MD;  Location: [facility];  Service: Cardiovascular;  Laterality: N/A;  . EYE SURGERY    . GAS INSERTION Left 01/13/2018   Procedure: INSERTION OF GAS;  Surgeon: Bernarda Caffey, MD;  Location: [facility];  Service: Ophthalmology;  Laterality: Left;  . LEFT HEART CATH AND CORONARY ANGIOGRAPHY N/A 09/07/2017   Procedure: LEFT HEART CATH AND CORONARY ANGIOGRAPHY;  Surgeon: Leonie Man, MD;  Location: [facility];  Service: Cardiovascular;  Laterality: N/A;  . MEMBRANE PEEL Left 01/13/2018   Procedure: MEMBRANE PEEL LEFT EYE ;  Surgeon: Bernarda Caffey, MD;  Location: [facility];  Service: Ophthalmology;  Laterality: Left;  Marland Kitchen MULTIPLE TOOTH EXTRACTIONS    . PARS PLANA VITRECTOMY Left 01/13/2018   Procedure: PARS PLANA VITRECTOMY WITH 25 GAUGE LEFT EYE WITH ENDOLASER;  Surgeon: Bernarda Caffey, MD;  Location: [facility];  Service: Ophthalmology;  Laterality: Left;  . TUBAL LIGATION  1980  . VAGINAL HYSTERECTOMY  1999   "partial; fibroids"    Family History  Problem Relation Age of Onset  . Diabetes Mother   . Hypertension Mother   . COPD Mother   . Heart failure Mother   . Hypertension Sister    . Hypertension Brother     Social History   Socioeconomic History  . Marital status: Married    Spouse name: Not on file  . Number of children: Not on file  . Years of education: 58  . Highest education level: Not on file  Occupational History  . Occupation: hairdresser  Social Needs  . Financial resource strain: Not on file  . Food insecurity    Worry: Not on file    Inability: Not on file  . Transportation needs    Medical: Not on file    Non-medical: Not on file  Tobacco Use  . Smoking status: Never Smoker  . Smokeless tobacco: Never Used  Substance and Sexual Activity  . Alcohol use: No    Alcohol/week: 0.0 standard drinks  . Drug use: No  . Sexual activity: Yes  Lifestyle  . Physical activity    Days per week: Not on file    Minutes per session: Not on file  . Stress: Not on file  Relationships  . Social Herbalist on phone: Not on file    Gets together: Not on file    Attends religious service: Not on file    Active member of club or organization: Not on file    Attends meetings of clubs or organizations: Not on file    Relationship status: Not on file  . Intimate partner violence    Fear of current or ex partner: Not on file    Emotionally abused: Not on file    Physically abused: Not on file    Forced sexual activity: Not on file  Other Topics Concern  . Not on file  Social History Narrative   Married, 2 children, hairdresser   Caffeine use- occasional tea or coffee   Right handed    Outpatient Medications Prior to Visit  Medication Sig Dispense Refill  . acetaminophen (TYLENOL) 500 MG tablet Take 1,000 mg by mouth every 6 (six) hours as needed for mild pain or headache.    . brimonidine (ALPHAGAN) 0.2 % ophthalmic solution Place 1 drop into the left eye 2 (two) times a day. 15 mL 10  . carvedilol (COREG) 25 MG tablet Take 1 tablet (25 mg total) by mouth 2 (two) times daily. 60 tablet 0  . dorzolamide-timolol (COSOPT) 22.3-6.8 MG/ML  ophthalmic solution Place 1 drop into the left eye 2 (two) times daily.    . ferrous sulfate (FERROUSUL) 325 (65 FE) MG tablet Take 1 tablet (325 mg total) by mouth every other day. 90 tablet 3  . furosemide (LASIX) 80 MG tablet Take 1 tablet (80 mg total) by mouth 2 (two) times daily. 60 tablet 0  . Glucose Blood (BLOOD GLUCOSE TEST STRIPS) STRP Use as directed 200 each 0  . hydrALAZINE (APRESOLINE) 100 MG tablet Take 1 tablet (100 mg total) by mouth 3 (three) times daily. 90 tablet 0  . Insulin Pen Needle 29G X 10MM MISC Use as directed 100 each 3  . INSULIN SYRINGE .5CC/29G 29G X 1/2" 0.5 ML MISC Use to administer insulin three times  daily 200 each 5  . isosorbide dinitrate (ISORDIL) 20 MG tablet Take 1 tablet (20 mg total) by mouth 3 (three) times daily. 90 tablet 0  . metolazone (ZAROXOLYN) 2.5 MG tablet Take 1 tablet (2.5 mg total) by mouth 3 (three) times a week. 30 tablet 0  . nitroGLYCERIN (NITROSTAT) 0.4 MG SL tablet Place 1 tablet (0.4 mg total) under the tongue every 5 (five) minutes x 3 doses as needed for chest pain. 30 tablet 12  . ticagrelor (BRILINTA) 90 MG TABS tablet Take 1 tablet (90 mg total) by mouth 2 (two) times daily. 180 tablet 3  . TRUEPLUS PEN NEEDLES 31G X 6 MM MISC USE AS DIRECTED 100 each 0  . warfarin (COUMADIN) 2.5 MG tablet Take 1 tablet (2.5 mg total) by mouth daily at 6 PM. 15 tablet 0  . glimepiride (AMARYL) 1 MG tablet Take 1 tablet (1 mg total) by mouth daily with breakfast. 30 tablet 0  . potassium chloride (K-DUR,KLOR-CON) 10 MEQ tablet Take 1 tablet with each dose of furosemide, daily. (Patient taking differently: Take 10 mEq by mouth 2 (two) times daily. Take 1 tablet with each dose of furosemide, daily.) 30 tablet 3  . atorvastatin (LIPITOR) 40 MG tablet Take 1 tablet (40 mg total) by mouth daily at 6 PM. (Patient not taking: Reported on 02/06/2019) 90 tablet 1   No facility-administered medications prior to visit.     Allergies  Allergen Reactions  .  Ace Inhibitors Cough    ROS Review of Systems  Constitutional: Positive for fatigue.  HENT: Negative.   Eyes: Positive for visual disturbance (history of cataracts, diabetic retinopathy. ).  Respiratory: Negative.   Cardiovascular: Negative.   Gastrointestinal: Negative.   Endocrine: Positive for polydipsia, polyphagia and polyuria.  Genitourinary: Negative.   Musculoskeletal: Negative.   Skin: Negative.   Allergic/Immunologic: Negative.   Neurological: Positive for dizziness (occasional) and headaches (Occasional).  Hematological: Negative.   Psychiatric/Behavioral: Negative.       Objective:    Physical Exam  Constitutional: She is oriented to person, place, and time. She appears well-developed and well-nourished.  HENT:  Head: Normocephalic and atraumatic.  Eyes: Conjunctivae are normal.  Neck: Normal range of motion. Neck supple.  Cardiovascular: Normal rate, regular rhythm, normal heart sounds and intact distal pulses.  Pulmonary/Chest: Effort normal and breath sounds normal.  Abdominal: Soft. Bowel sounds are normal.  Musculoskeletal: Normal range of motion.  Neurological: She is alert and oriented to person, place, and time. She has normal reflexes.  Skin: Skin is warm.  Psychiatric: She has a normal mood and affect. Her behavior is normal. Judgment and thought content normal.  Nursing note and vitals reviewed.   BP (!) 160/58   Pulse 60   Ht 5\' 6"  (1.676 m)   Wt 167 lb (75.8 kg)   SpO2 94%   BMI 26.95 kg/m  Wt Readings from Last 3 Encounters:  02/06/19 167 lb (75.8 kg)  01/30/19 182 lb 12.8 oz (82.9 kg)  01/23/19 194 lb (88 kg)     Health Maintenance Due  Topic Date Due  . Hepatitis C Screening  Jan 12, 1956  . PNEUMOCOCCAL POLYSACCHARIDE VACCINE AGE 29-64 HIGH RISK  04/09/1958  . OPHTHALMOLOGY EXAM  04/09/1966  . PAP SMEAR-Modifier  04/09/1977    There are no preventive care reminders to display for this patient.  Lab Results  Component Value  Date   TSH 1.730 01/24/2018   Lab Results  Component Value Date   WBC 4.3  01/30/2019   HGB 11.1 (L) 01/30/2019   HCT 35.2 (L) 01/30/2019   MCV 72.4 (L) 01/30/2019   PLT 360 01/30/2019   Lab Results  Component Value Date   NA 137 01/30/2019   K 3.7 01/30/2019   CO2 27 01/30/2019   GLUCOSE 113 (H) 01/30/2019   BUN 39 (H) 01/30/2019   CREATININE 3.63 (H) 01/30/2019   BILITOT 0.4 01/24/2019   ALKPHOS 67 01/24/2019   AST 19 01/24/2019   ALT 14 01/24/2019   PROT 4.9 (L) 01/24/2019   ALBUMIN 2.0 (L) 01/24/2019   CALCIUM 8.4 (L) 01/30/2019   ANIONGAP 13 01/30/2019   Lab Results  Component Value Date   CHOL 217 (H) 01/28/2019   Lab Results  Component Value Date   HDL 75 01/28/2019   Lab Results  Component Value Date   LDLCALC 130 (H) 01/28/2019   Lab Results  Component Value Date   TRIG 59 01/28/2019   Lab Results  Component Value Date   CHOLHDL 2.9 01/28/2019   Lab Results  Component Value Date   HGBA1C 10.9 (H) 01/23/2019      Assessment & Plan:   1. Essential hypertension - POCT urinalysis dipstick  2. Elevated blood pressure reading Blood pressure reading is stable at 156/54 today. She will continue to decrease high sodium intake, excessive alcohol intake, increase potassium intake, smoking cessation, and increase physical activity of at least 30 minutes of cardio activity daily. She will continue to follow Heart Healthy or DASH diet. She will keep follow up with Cardiologist on 02/2019. We will continue to monitor.   3. Dizziness Stable today. Continue Meclizine as needed. Monitor.   4. Type 2 diabetes mellitus with other circulatory complication, without long-term current use of insulin (HCC) Hgb A1c is elevated at 10.9. Blood glucose is 171 today. She will continue to decrease foods/beverages high in sugars and carbs and follow Heart Healthy or DASH diet. Increase physical activity to at least 30 minutes cardio exercise daily.  - atorvastatin (LIPITOR)  40 MG tablet; Take 1 tablet (40 mg total) by mouth daily at 6 PM.  Dispense: 90 tablet; Refill: 1 - glipiZIDE (GLUCOTROL) 10 MG tablet; Take 1 tablet (10 mg total) by mouth 2 (two) times daily before a meal.  Dispense: 60 tablet; Refill: 3 - metFORMIN (GLUCOPHAGE) 500 MG tablet; Take 1 tablet (500 mg total) by mouth 2 (two) times daily with a meal.  Dispense: 60 tablet; Refill: 3 - POCT glucose (manual entry)  5. Hemoglobin A1C greater than 9%, indicating poor diabetic control Hgb A1c is currently 10.9.   6. Edema, peripheral 1+ bilateral peripheral in lower extremities today. She will continue Lasix as prescribed.   7. Visual problems She will continue eye drops as prescribed. She continues to follow up with Optometry as needed.   8. 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   9. Cataract of both eyes, unspecified cataract type  10. Follow up She will follow up in 1 month.   Meds ordered this encounter  Medications  . atorvastatin (LIPITOR) 40 MG tablet    Sig: Take 1 tablet (40 mg total) by mouth daily at 6 PM.    Dispense:  90 tablet    Refill:  1  . glipiZIDE (GLUCOTROL) 10 MG tablet    Sig: Take 1 tablet (10 mg total) by mouth 2 (two) times daily before a meal.    Dispense:  60 tablet    Refill:  3  . metFORMIN (GLUCOPHAGE) 500 MG tablet    Sig: Take 1 tablet (500 mg total) by mouth 2 (two) times daily with a meal.    Dispense:  60 tablet    Refill:  3    Orders Placed This Encounter  Procedures  . POCT urinalysis dipstick  . POCT glucose (manual entry)    Referral Orders  No referral(s) requested today    Kathe Becton,  MSN, FNP-BC Patient [place], [place] [phone]  Problem List Items Addressed This Visit      Cardiovascular and Mediastinum   Essential hypertension - Primary    Relevant Medications   atorvastatin (LIPITOR) 40 MG tablet   Other Relevant Orders   POCT urinalysis dipstick     Endocrine   Type 2 diabetes mellitus without complication, with long-term current use of insulin (HCC)   Relevant Medications   atorvastatin (LIPITOR) 40 MG tablet   glipiZIDE (GLUCOTROL) 10 MG tablet   metFORMIN (GLUCOPHAGE) 500 MG tablet     Other   Cataract of both eyes   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 atorvastatin (LIPITOR) 40 MG tablet    Sig: Take 1 tablet (40 mg total) by mouth daily at 6 PM.    Dispense:  90 tablet    Refill:  1  . glipiZIDE (GLUCOTROL) 10 MG tablet    Sig: Take 1 tablet (10 mg total) by mouth 2 (two) times daily before a meal.    Dispense:  60 tablet    Refill:  3  . metFORMIN (GLUCOPHAGE) 500 MG tablet    Sig: Take 1 tablet (500 mg total) by mouth 2 (two) times daily with a meal.    Dispense:  60 tablet    Refill:  3    Follow-up: Return in about 1 month (around 03/08/2019).    Azzie Glatter, FNP

## 2019-02-06 NOTE — Patient Instructions (Signed)
DASH Eating Plan DASH stands for "Dietary Approaches to Stop Hypertension." The DASH eating plan is a healthy eating plan that has been shown to reduce high blood pressure (hypertension). It may also reduce your risk for type 2 diabetes, heart disease, and stroke. The DASH eating plan may also help with weight loss. What are tips for following this plan?  General guidelines  Avoid eating more than 2,300 mg (milligrams) of salt (sodium) a day. If you have hypertension, you may need to reduce your sodium intake to 1,500 mg a day.  Limit alcohol intake to no more than 1 drink a day for nonpregnant women and 2 drinks a day for men. One drink equals 12 oz of beer, 5 oz of wine, or 1 oz of hard liquor.  Work with your health care provider to maintain a healthy body weight or to lose weight. Ask what an ideal weight is for you.  Get at least 30 minutes of exercise that causes your heart to beat faster (aerobic exercise) most days of the week. Activities may include walking, swimming, or biking.  Work with your health care provider or diet and nutrition specialist (dietitian) to adjust your eating plan to your individual calorie needs. Reading food labels   Check food labels for the amount of sodium per serving. Choose foods with less than 5 percent of the Daily Value of sodium. Generally, foods with less than 300 mg of sodium per serving fit into this eating plan.  To find whole grains, look for the word "whole" as the first word in the ingredient list. Shopping  Buy products labeled as "low-sodium" or "no salt added."  Buy fresh foods. Avoid canned foods and premade or frozen meals. Cooking  Avoid adding salt when cooking. Use salt-free seasonings or herbs instead of table salt or sea salt. Check with your health care provider or pharmacist before using salt substitutes.  Do not fry foods. Cook foods using healthy methods such as baking, boiling, grilling, and broiling instead.  Cook with  heart-healthy oils, such as olive, canola, soybean, or sunflower oil. Meal planning  Eat a balanced diet that includes: ? 5 or more servings of fruits and vegetables each day. At each meal, try to fill half of your plate with fruits and vegetables. ? Up to 6-8 servings of whole grains each day. ? Less than 6 oz of lean meat, poultry, or fish each day. A 3-oz serving of meat is about the same size as a deck of cards. One egg equals 1 oz. ? 2 servings of low-fat dairy each day. ? A serving of nuts, seeds, or beans 5 times each week. ? Heart-healthy fats. Healthy fats called Omega-3 fatty acids are found in foods such as flaxseeds and coldwater fish, like sardines, salmon, and mackerel.  Limit how much you eat of the following: ? Canned or prepackaged foods. ? Food that is high in trans fat, such as fried foods. ? Food that is high in saturated fat, such as fatty meat. ? Sweets, desserts, sugary drinks, and other foods with added sugar. ? Full-fat dairy products.  Do not salt foods before eating.  Try to eat at least 2 vegetarian meals each week.  Eat more home-cooked food and less restaurant, buffet, and fast food.  When eating at a restaurant, ask that your food be prepared with less salt or no salt, if possible. What foods are recommended? The items listed may not be a complete list. Talk with your dietitian about   what dietary choices are best for you. Grains Whole-grain or whole-wheat bread. Whole-grain or whole-wheat pasta. Brown rice. Oatmeal. Quinoa. Bulgur. Whole-grain and low-sodium cereals. Pita bread. Low-fat, low-sodium crackers. Whole-wheat flour tortillas. Vegetables Fresh or frozen vegetables (raw, steamed, roasted, or grilled). Low-sodium or reduced-sodium tomato and vegetable juice. Low-sodium or reduced-sodium tomato sauce and tomato paste. Low-sodium or reduced-sodium canned vegetables. Fruits All fresh, dried, or frozen fruit. Canned fruit in natural juice (without  added sugar). Meat and other protein foods Skinless chicken or turkey. Ground chicken or turkey. Pork with fat trimmed off. Fish and seafood. Egg whites. Dried beans, peas, or lentils. Unsalted nuts, nut butters, and seeds. Unsalted canned beans. Lean cuts of beef with fat trimmed off. Low-sodium, lean deli meat. Dairy Low-fat (1%) or fat-free (skim) milk. Fat-free, low-fat, or reduced-fat cheeses. Nonfat, low-sodium ricotta or cottage cheese. Low-fat or nonfat yogurt. Low-fat, low-sodium cheese. Fats and oils Soft margarine without trans fats. Vegetable oil. Low-fat, reduced-fat, or light mayonnaise and salad dressings (reduced-sodium). Canola, safflower, olive, soybean, and sunflower oils. Avocado. Seasoning and other foods Herbs. Spices. Seasoning mixes without salt. Unsalted popcorn and pretzels. Fat-free sweets. What foods are not recommended? The items listed may not be a complete list. Talk with your dietitian about what dietary choices are best for you. Grains Baked goods made with fat, such as croissants, muffins, or some breads. Dry pasta or rice meal packs. Vegetables Creamed or fried vegetables. Vegetables in a cheese sauce. Regular canned vegetables (not low-sodium or reduced-sodium). Regular canned tomato sauce and paste (not low-sodium or reduced-sodium). Regular tomato and vegetable juice (not low-sodium or reduced-sodium). Pickles. Olives. Fruits Canned fruit in a light or heavy syrup. Fried fruit. Fruit in cream or butter sauce. Meat and other protein foods Fatty cuts of meat. Ribs. Fried meat. Bacon. Sausage. Bologna and other processed lunch meats. Salami. Fatback. Hotdogs. Bratwurst. Salted nuts and seeds. Canned beans with added salt. Canned or smoked fish. Whole eggs or egg yolks. Chicken or turkey with skin. Dairy Whole or 2% milk, cream, and half-and-half. Whole or full-fat cream cheese. Whole-fat or sweetened yogurt. Full-fat cheese. Nondairy creamers. Whipped toppings.  Processed cheese and cheese spreads. Fats and oils Butter. Stick margarine. Lard. Shortening. Ghee. Bacon fat. Tropical oils, such as coconut, palm kernel, or palm oil. Seasoning and other foods Salted popcorn and pretzels. Onion salt, garlic salt, seasoned salt, table salt, and sea salt. Worcestershire sauce. Tartar sauce. Barbecue sauce. Teriyaki sauce. Soy sauce, including reduced-sodium. Steak sauce. Canned and packaged gravies. Fish sauce. Oyster sauce. Cocktail sauce. Horseradish that you find on the shelf. Ketchup. Mustard. Meat flavorings and tenderizers. Bouillon cubes. Hot sauce and Tabasco sauce. Premade or packaged marinades. Premade or packaged taco seasonings. Relishes. Regular salad dressings. Where to find more information:  National Heart, Lung, and Blood Institute: www.nhlbi.nih.gov  American Heart Association: www.heart.org Summary  The DASH eating plan is a healthy eating plan that has been shown to reduce high blood pressure (hypertension). It may also reduce your risk for type 2 diabetes, heart disease, and stroke.  With the DASH eating plan, you should limit salt (sodium) intake to 2,300 mg a day. If you have hypertension, you may need to reduce your sodium intake to 1,500 mg a day.  When on the DASH eating plan, aim to eat more fresh fruits and vegetables, whole grains, lean proteins, low-fat dairy, and heart-healthy fats.  Work with your health care provider or diet and nutrition specialist (dietitian) to adjust your eating plan to your   individual calorie needs. This information is not intended to replace advice given to you by your health care provider. Make sure you discuss any questions you have with your health care provider. Document Released: 07/16/2011 Document Revised: 07/09/2017 Document Reviewed: 07/20/2016 Elsevier Patient Education  2020 [employer]. Diabetes Basics  Diabetes (diabetes mellitus) is a long-term (chronic) disease. It occurs when the body  does not properly use sugar (glucose) that is released from food after you eat. Diabetes may be caused by one or both of these problems:  Your pancreas does not make enough of a hormone called insulin.  Your body does not react in a normal way to insulin that it makes. Insulin lets sugars (glucose) go into cells in your body. This gives you energy. If you have diabetes, sugars cannot get into cells. This causes high blood sugar (hyperglycemia). Follow these instructions at home: How is diabetes treated? You may need to take insulin or other diabetes medicines daily to keep your blood sugar in balance. Take your diabetes medicines every day as told by your doctor. List your diabetes medicines here: Diabetes medicines  Name of medicine: ______________________________ ? Amount (dose): _______________ Time (a.m./p.m.): _______________ Notes: ___________________________________  Name of medicine: ______________________________ ? Amount (dose): _______________ Time (a.m./p.m.): _______________ Notes: ___________________________________  Name of medicine: ______________________________ ? Amount (dose): _______________ Time (a.m./p.m.): _______________ Notes: ___________________________________ If you use insulin, you will learn how to give yourself insulin by injection. You may need to adjust the amount based on the food that you eat. List the types of insulin you use here: Insulin  Insulin type: ______________________________ ? Amount (dose): _______________ Time (a.m./p.m.): _______________ Notes: ___________________________________  Insulin type: ______________________________ ? Amount (dose): _______________ Time (a.m./p.m.): _______________ Notes: ___________________________________  Insulin type: ______________________________ ? Amount (dose): _______________ Time (a.m./p.m.): _______________ Notes: ___________________________________  Insulin type: ______________________________ ?  Amount (dose): _______________ Time (a.m./p.m.): _______________ Notes: ___________________________________  Insulin type: ______________________________ ? Amount (dose): _______________ Time (a.m./p.m.): _______________ Notes: ___________________________________ How do I manage my blood sugar?  Check your blood sugar levels using a blood glucose monitor as directed by your doctor. Your doctor will set treatment goals for you. Generally, you should have these blood sugar levels:  Before meals (preprandial): 80-130 mg/dL (4.4-7.2 mmol/L).  After meals (postprandial): below 180 mg/dL (10 mmol/L).  A1c level: less than 7%. Write down the times that you will check your blood sugar levels: Blood sugar checks  Time: _______________ Notes: ___________________________________  Time: _______________ Notes: ___________________________________  Time: _______________ Notes: ___________________________________  Time: _______________ Notes: ___________________________________  Time: _______________ Notes: ___________________________________  Time: _______________ Notes: ___________________________________  What do I need to know about low blood sugar? Low blood sugar is called hypoglycemia. This is when blood sugar is at or below 70 mg/dL (3.9 mmol/L). Symptoms may include:  Feeling: ? Hungry. ? Worried or nervous (anxious). ? Sweaty and clammy. ? Confused. ? Dizzy. ? Sleepy. ? Sick to your stomach (nauseous).  Having: ? A fast heartbeat. ? A headache. ? A change in your vision. ? Tingling or no feeling (numbness) around the mouth, lips, or tongue. ? Jerky movements that you cannot control (seizure).  Having trouble with: ? Moving (coordination). ? Sleeping. ? Passing out (fainting). ? Getting upset easily (irritability). Treating low blood sugar To treat low blood sugar, eat or drink something sugary right away. If you can think clearly and swallow safely, follow the 15:15  rule:  Take 15 grams of a fast-acting carb (carbohydrate). Talk with your doctor about how much you  should take.  Some fast-acting carbs are: ? Sugar tablets (glucose pills). Take 3-4 glucose pills. ? 6-8 pieces of hard candy. ? 4-6 oz (120-150 mL) of fruit juice. ? 4-6 oz (120-150 mL) of regular (not diet) soda. ? 1 Tbsp (15 mL) honey or sugar.  Check your blood sugar 15 minutes after you take the carb.  If your blood sugar is still at or below 70 mg/dL (3.9 mmol/L), take 15 grams of a carb again.  If your blood sugar does not go above 70 mg/dL (3.9 mmol/L) after 3 tries, get help right away.  After your blood sugar goes back to normal, eat a meal or a snack within 1 hour. Treating very low blood sugar If your blood sugar is at or below 54 mg/dL (3 mmol/L), you have very low blood sugar (severe hypoglycemia). This is an emergency. Do not wait to see if the symptoms will go away. Get medical help right away. Call your local emergency services (911 in the [place] Do not drive yourself to the hospital. Questions to ask your health care provider  Do I need to meet with a diabetes educator?  What equipment will I need to care for myself at home?  What diabetes medicines do I need? When should I take them?  How often do I need to check my blood sugar?  What number can I call if I have questions?  When is my next doctor's visit?  Where can I find a support group for people with diabetes? Where to find more information  American Diabetes Association: www.diabetes.org  American Association of Diabetes Educators: www.diabeteseducator.org/patient-resources Contact a doctor if:  Your blood sugar is at or above 240 mg/dL (13.3 mmol/L) for 2 days in a row.  You have been sick or have had a fever for 2 days or more, and you are not getting better.  You have any of these problems for more than 6 hours: ? You cannot eat or drink. ? You feel sick to your stomach (nauseous). ? You throw  up (vomit). ? You have watery poop (diarrhea). Get help right away if:  Your blood sugar is lower than 54 mg/dL (3 mmol/L).  You get confused.  You have trouble: ? Thinking clearly. ? Breathing. Summary  Diabetes (diabetes mellitus) is a long-term (chronic) disease. It occurs when the body does not properly use sugar (glucose) that is released from food after digestion.  Take insulin and diabetes medicines as told.  Check your blood sugar every day, as often as told.  Keep all follow-up visits as told by your doctor. This is important. This information is not intended to replace advice given to you by your health care provider. Make sure you discuss any questions you have with your health care provider. Document Released: 10/29/2017 Document Revised: 09/16/2018 Document Reviewed: 10/29/2017 Elsevier Patient Education  [place]. Metformin tablets What is this medicine? METFORMIN (met FOR min) is used to treat type 2 diabetes. It helps to control blood sugar. Treatment is combined with diet and exercise. This medicine can be used alone or with other medicines for diabetes. This medicine may be used for other purposes; ask your health care provider or pharmacist if you have questions. COMMON BRAND NAME(S): Glucophage What should I tell my health care provider before I take this medicine? They need to know if you have any of these conditions:  anemia  dehydration  heart disease  frequently drink alcohol-containing beverages  kidney disease  liver disease  polycystic ovary syndrome  serious infection or injury  vomiting  an unusual or allergic reaction to metformin, other medicines, foods, dyes, or preservatives  pregnant or trying to get pregnant  breast-feeding How should I use this medicine? Take this medicine by mouth with a glass of water. Follow the directions on the prescription label. Take this medicine with food. Take your medicine at regular  intervals. Do not take your medicine more often than directed. Do not stop taking except on your doctor's advice. Talk to your pediatrician regarding the use of this medicine in children. While this drug may be prescribed for children as young as 90 years of age for selected conditions, precautions do apply. Overdosage: If you think you have taken too much of this medicine contact a poison control center or emergency room at once. NOTE: This medicine is only for you. Do not share this medicine with others. What if I miss a dose? If you miss a dose, take it as soon as you can. If it is almost time for your next dose, take only that dose. Do not take double or extra doses. What may interact with this medicine? Do not take this medicine with any of the following medications:  certain contrast medicines given before X-rays, CT scans, MRI, or other procedures  dofetilide This medicine may also interact with the following medications:  acetazolamide  alcohol  certain antivirals for HIV or hepatitis  certain medicines for blood pressure, heart disease, irregular heart beat  cimetidine  dichlorphenamide  digoxin  diuretics  female hormones, like estrogens or progestins and birth control pills  glycopyrrolate  isoniazid  lamotrigine  memantine  methazolamide  metoclopramide  midodrine  niacin  phenothiazines like chlorpromazine, mesoridazine, prochlorperazine, thioridazine  phenytoin  ranolazine  steroid medicines like prednisone or cortisone  stimulant medicines for attention disorders, weight loss, or to stay awake  thyroid medicines  topiramate  trospium  vandetanib  zonisamide This list may not describe all possible interactions. Give your health care provider a list of all the medicines, herbs, non-prescription drugs, or dietary supplements you use. Also tell them if you smoke, drink alcohol, or use illegal drugs. Some items may interact with your  medicine. What should I watch for while using this medicine? Visit your doctor or health care professional for regular checks on your progress. A test called the HbA1C (A1C) will be monitored. This is a simple blood test. It measures your blood sugar control over the last 2 to 3 months. You will receive this test every 3 to 6 months. Learn how to check your blood sugar. Learn the symptoms of low and high blood sugar and how to manage them. Always carry a quick-source of sugar with you in case you have symptoms of low blood sugar. Examples include hard sugar candy or glucose tablets. Make sure others know that you can choke if you eat or drink when you develop serious symptoms of low blood sugar, such as seizures or unconsciousness. They must get medical help at once. Tell your doctor or health care professional if you have high blood sugar. You might need to change the dose of your medicine. If you are sick or exercising more than usual, you might need to change the dose of your medicine. Do not skip meals. Ask your doctor or health care professional if you should avoid alcohol. Many nonprescription cough and cold products contain sugar or alcohol. These can affect blood sugar. This medicine may cause ovulation in premenopausal women  who do not have regular monthly periods. This may increase your chances of becoming pregnant. You should not take this medicine if you become pregnant or think you may be pregnant. Talk with your doctor or health care professional about your birth control options while taking this medicine. Contact your doctor or health care professional right away if you think you are pregnant. If you are going to need surgery, a MRI, CT scan, or other procedure, tell your doctor that you are taking this medicine. You may need to stop taking this medicine before the procedure. Wear a medical ID bracelet or chain, and carry a card that describes your disease and details of your medicine and  dosage times. This medicine may cause a decrease in folic acid and vitamin B12. You should make sure that you get enough vitamins while you are taking this medicine. Discuss the foods you eat and the vitamins you take with your health care professional. What side effects may I notice from receiving this medicine? Side effects that you should report to your doctor or health care professional as soon as possible:  allergic reactions like skin rash, itching or hives, swelling of the face, lips, or tongue  breathing problems  feeling faint or lightheaded, falls  muscle aches or pains  signs and symptoms of low blood sugar such as feeling anxious, confusion, dizziness, increased hunger, unusually weak or tired, sweating, shakiness, cold, irritable, headache, blurred vision, fast heartbeat, loss of consciousness  slow or irregular heartbeat  unusual stomach pain or discomfort  unusually tired or weak Side effects that usually do not require medical attention (report to your doctor or health care professional if they continue or are bothersome):  diarrhea  headache  heartburn  metallic taste in mouth  nausea  stomach gas, upset This list may not describe all possible side effects. Call your doctor for medical advice about side effects. You may report side effects to FDA at 1-800-FDA-1088. Where should I keep my medicine? Keep out of the reach of children. Store at room temperature between 15 and 30 degrees C (59 and 86 degrees F). Protect from moisture and light. Throw away any unused medicine after the expiration date. NOTE: This sheet is a summary. It may not cover all possible information. If you have questions about this medicine, talk to your doctor, pharmacist, or health care provider.  2020 Elsevier/Gold Standard (2017-09-02 19:15:19) Glipizide tablets What is this medicine? GLIPIZIDE (GLIP i zide) helps to treat type 2 diabetes. Treatment is combined with diet and exercise.  The medicine helps your body to use insulin better. This medicine may be used for other purposes; ask your health care provider or pharmacist if you have questions. COMMON BRAND NAME(S): Glucotrol What should I tell my health care provider before I take this medicine? They need to know if you have any of these conditions:  diabetic ketoacidosis  glucose-6-phosphate dehydrogenase deficiency  heart disease  kidney disease  liver disease  porphyria  severe infection or injury  thyroid disease  an unusual or allergic reaction to glipizide, sulfa drugs, other medicines, foods, dyes, or preservatives  pregnant or trying to get pregnant  breast-feeding How should I use this medicine? Take this medicine by mouth. Swallow with a drink of water. Do not take with food. Take it 30 minutes before a meal. Follow the directions on the prescription label. If you take this medicine once a day, take it 30 minutes before breakfast. Take your doses at the same time  each day. Do not take more often than directed. Talk to your pediatrician regarding the use of this medicine in children. Special care may be needed. Elderly patients over 26 years old may have a stronger reaction and need a smaller dose. Overdosage: If you think you have taken too much of this medicine contact a poison control center or emergency room at once. NOTE: This medicine is only for you. Do not share this medicine with others. What if I miss a dose? If you miss a dose, take it as soon as you can. If it is almost time for your next dose, take only that dose. Do not take double or extra doses. What may interact with this medicine?  bosentan  chloramphenicol  cisapride  clarithromycin  medicines for fungal or yeast infections  metoclopramide  probenecid  warfarin Many medications may cause an increase or decrease in blood sugar, these include:  alcohol containing beverages  aspirin and aspirin-like drugs   chloramphenicol  chromium  diuretics  female hormones, like estrogens or progestins and birth control pills  heart medicines  isoniazid  female hormones or anabolic steroids  medicines for weight loss  medicines for allergies, asthma, cold, or cough  medicines for mental problems  medicines called MAO Inhibitors like Nardil, Parnate, Marplan, Eldepryl  niacin  NSAIDs, medicines for pain and inflammation, like ibuprofen or naproxen  pentamidine  phenytoin  probenecid  quinolone antibiotics like ciprofloxacin, levofloxacin, ofloxacin  some herbal dietary supplements  steroid medicines like prednisone or cortisone  thyroid medicine This list may not describe all possible interactions. Give your health care provider a list of all the medicines, herbs, non-prescription drugs, or dietary supplements you use. Also tell them if you smoke, drink alcohol, or use illegal drugs. Some items may interact with your medicine. What should I watch for while using this medicine? Visit your doctor or health care professional for regular checks on your progress. A test called the HbA1C (A1C) will be monitored. This is a simple blood test. It measures your blood sugar control over the last 2 to 3 months. You will receive this test every 3 to 6 months. Learn how to check your blood sugar. Learn the symptoms of low and high blood sugar and how to manage them. Always carry a quick-source of sugar with you in case you have symptoms of low blood sugar. Examples include hard sugar candy or glucose tablets. Make sure others know that you can choke if you eat or drink when you develop serious symptoms of low blood sugar, such as seizures or unconsciousness. They must get medical help at once. Tell your doctor or health care professional if you have high blood sugar. You might need to change the dose of your medicine. If you are sick or exercising more than usual, you might need to change the dose of  your medicine. Do not skip meals. Ask your doctor or health care professional if you should avoid alcohol. Many nonprescription cough and cold products contain sugar or alcohol. These can affect blood sugar. This medicine can make you more sensitive to the sun. Keep out of the sun. If you cannot avoid being in the sun, wear protective clothing and use sunscreen. Do not use sun lamps or tanning beds/booths. Wear a medical ID bracelet or chain, and carry a card that describes your disease and details of your medicine and dosage times. What side effects may I notice from receiving this medicine? Side effects that you should report to  your doctor or health care professional as soon as possible:  allergic reactions like skin rash, itching or hives, swelling of the face, lips, or tongue  breathing problems  dark urine  fever, chills, sore throat  signs and symptoms of low blood sugar such as feeling anxious, confusion, dizziness, increased hunger, unusually weak or tired, sweating, shakiness, cold, irritable, headache, blurred vision, fast heartbeat, loss of consciousness  unusual bleeding or bruising  yellowing of the eyes or skin Side effects that usually do not require medical attention (report to your doctor or health care professional if they continue or are bothersome):  diarrhea  dizziness  headache  heartburn  nausea  stomach gas This list may not describe all possible side effects. Call your doctor for medical advice about side effects. You may report side effects to FDA at 1-800-FDA-1088. Where should I keep my medicine? Keep out of the reach of children. Store at room temperature below 30 degrees C (86 degrees F). Throw away any unused medicine after the expiration date. NOTE: This sheet is a summary. It may not cover all possible information. If you have questions about this medicine, talk to your doctor, pharmacist, or health care provider.  2020 Elsevier/Gold Standard  (2012-11-09 14:42:46)

## 2019-02-07 NOTE — Progress Notes (Signed)
[facility] Note  02/08/2019     CHIEF COMPLAINT Patient presents for Retina Evaluation s/p 25g PPV/MP/EL/Gas OS for TRD and VH, 01/13/18  HISTORY OF PRESENT ILLNESS: Amber Young is a 63 y.o. female who presents to the clinic today for:   HPI    Retina Evaluation    In both eyes.  This started weeks ago.  Duration of weeks.  Context:  distance vision.  I, the attending physician,  performed the HPI with the patient and updated documentation appropriately.          Comments    BS this AM: 111 HgA1c: 10.9 Patient states her vision fluctuates a lot.  Says it is sometimes good and sometimes bad.  Patient denies eye pain or discomfort and denies any new or worsening floaters or fol.       Last edited by Bernarda Caffey, MD on 02/08/2019  9:35 AM. (History)    pt states her vision is pretty much the same as last time, she states she Cosopt and brimonidine OU, she states she was in the hospital about a week ago bc her feet were swelling and she was short of breath, she states they changed and added medications to her regimen. Review of chart shows admission from 6/15 - 01/30/2019 for acute on chronic CHF exacerbation.   Referring physician: Azzie Glatter, FNP [place],   [id number]  HISTORICAL INFORMATION:   Selected notes from the MEDICAL RECORD NUMBER Referred by Molli Barrows, FNP for DM exam;  LEE-  Ocular Hx-  PMH- DM (A1C - 9.4 (02.13.19)), CKD, HTN, CAD, CHF, hx of stroke    CURRENT MEDICATIONS: Current Outpatient Medications (Ophthalmic Drugs)  Medication Sig  . brimonidine (ALPHAGAN) 0.2 % ophthalmic solution Place 1 drop into the left eye 2 (two) times a day.  . dorzolamide-timolol (COSOPT) 22.3-6.8 MG/ML ophthalmic solution Place 1 drop into the left eye 2 (two) times daily.   No current facility-administered medications for this visit.  (Ophthalmic Drugs)   Current Outpatient Medications (Other)  Medication Sig   . acetaminophen (TYLENOL) 500 MG tablet Take 1,000 mg by mouth every 6 (six) hours as needed for mild pain or headache.  Marland Kitchen atorvastatin (LIPITOR) 40 MG tablet Take 1 tablet (40 mg total) by mouth daily at 6 PM.  . carvedilol (COREG) 25 MG tablet Take 1 tablet (25 mg total) by mouth 2 (two) times daily.  . ferrous sulfate (FERROUSUL) 325 (65 FE) MG tablet Take 1 tablet (325 mg total) by mouth every other day.  . furosemide (LASIX) 80 MG tablet Take 1 tablet (80 mg total) by mouth 2 (two) times daily.  Marland Kitchen glipiZIDE (GLUCOTROL) 10 MG tablet Take 1 tablet (10 mg total) by mouth 2 (two) times daily before a meal.  . Glucose Blood (BLOOD GLUCOSE TEST STRIPS) STRP Use as directed  . hydrALAZINE (APRESOLINE) 100 MG tablet Take 1 tablet (100 mg total) by mouth 3 (three) times daily.  . Insulin Pen Needle 29G X 10MM MISC Use as directed  . INSULIN SYRINGE .5CC/29G 29G X 1/2" 0.5 ML MISC Use to administer insulin three times daily  . isosorbide dinitrate (ISORDIL) 20 MG tablet Take 1 tablet (20 mg total) by mouth 3 (three) times daily.  . metFORMIN (GLUCOPHAGE) 500 MG tablet Take 1 tablet (500 mg total) by mouth 2 (two) times daily with a meal.  . metolazone (ZAROXOLYN) 2.5 MG tablet Take 1 tablet (2.5 mg total)  by mouth 3 (three) times a week.  . nitroGLYCERIN (NITROSTAT) 0.4 MG SL tablet Place 1 tablet (0.4 mg total) under the tongue every 5 (five) minutes x 3 doses as needed for chest pain.  . potassium chloride (K-DUR,KLOR-CON) 10 MEQ tablet Take 1 tablet with each dose of furosemide, daily. (Patient taking differently: Take 10 mEq by mouth 2 (two) times daily. Take 1 tablet with each dose of furosemide, daily.)  . ticagrelor (BRILINTA) 90 MG TABS tablet Take 1 tablet (90 mg total) by mouth 2 (two) times daily.  . TRUEPLUS PEN NEEDLES 31G X 6 MM MISC USE AS DIRECTED  . warfarin (COUMADIN) 2.5 MG tablet Take 1 tablet (2.5 mg total) by mouth daily at 6 PM.   No current facility-administered medications  for this visit.  (Other)      REVIEW OF SYSTEMS: ROS    Positive for: Endocrine, Eyes   Negative for: Constitutional, Gastrointestinal, Neurological, Skin, Genitourinary, Musculoskeletal, HENT, Cardiovascular, Respiratory, Psychiatric, Allergic/Imm, Heme/Lymph   Last edited by Doneen Poisson on 02/08/2019  9:09 AM. (History)       ALLERGIES Allergies  Allergen Reactions  . Ace Inhibitors Cough    PAST MEDICAL HISTORY Past Medical History:  Diagnosis Date  . Anemia   . Chronic combined systolic and diastolic CHF (congestive heart failure) ([place])   . CKD (chronic kidney disease), stage IV ([place])   . Coronary artery disease 08/2017   DES x 2 RCA  . GERD (gastroesophageal reflux disease)   . Heart murmur   . Hyperlipidemia   . Hypertension   . Proliferative diabetic retinopathy ([place])    left eye with vitreous hemorrhage and tractional retinal detachment  . Stroke ([place]) 09/2014   numbness left upper lip, finger tips on left hand; "resolved" (05/18/2016)  . Type II diabetes mellitus ([place])   . Wears glasses    Past Surgical History:  Procedure Laterality Date  . CARDIAC CATHETERIZATION N/A 05/18/2016   Procedure: Left Heart Cath and Coronary Angiography;  Surgeon: Jettie Booze, MD;  Location: [facility];  Service: Cardiovascular;  Laterality: N/A;  . CARDIAC CATHETERIZATION N/A 05/18/2016   Procedure: Coronary Stent Intervention;  Surgeon: Jettie Booze, MD;  Location: [facility];  Service: Cardiovascular;  Laterality: N/A;  . COLONOSCOPY W/ BIOPSIES AND POLYPECTOMY    . CORONARY ANGIOPLASTY    . CORONARY ANGIOPLASTY WITH STENT PLACEMENT    . CORONARY STENT INTERVENTION N/A 09/07/2017   Procedure: CORONARY STENT INTERVENTION;  Surgeon: Leonie Man, MD;  Location: [facility];  Service: Cardiovascular;  Laterality: N/A;  . EYE SURGERY    . GAS INSERTION Left 01/13/2018   Procedure: INSERTION OF GAS;  Surgeon: Bernarda Caffey, MD;  Location:  [facility];  Service: Ophthalmology;  Laterality: Left;  . LEFT HEART CATH AND CORONARY ANGIOGRAPHY N/A 09/07/2017   Procedure: LEFT HEART CATH AND CORONARY ANGIOGRAPHY;  Surgeon: Leonie Man, MD;  Location: [facility];  Service: Cardiovascular;  Laterality: N/A;  . MEMBRANE PEEL Left 01/13/2018   Procedure: MEMBRANE PEEL LEFT EYE ;  Surgeon: Bernarda Caffey, MD;  Location: [facility];  Service: Ophthalmology;  Laterality: Left;  Marland Kitchen MULTIPLE TOOTH EXTRACTIONS    . PARS PLANA VITRECTOMY Left 01/13/2018   Procedure: PARS PLANA VITRECTOMY WITH 25 GAUGE LEFT EYE WITH ENDOLASER;  Surgeon: Bernarda Caffey, MD;  Location: [facility];  Service: Ophthalmology;  Laterality: Left;  . TUBAL LIGATION  1980  . VAGINAL HYSTERECTOMY  1999   "partial; fibroids"    FAMILY HISTORY Family History  Problem Relation Age of Onset  . Diabetes Mother   . Hypertension Mother   . COPD Mother   . Heart failure Mother   . Hypertension Sister   . Hypertension Brother     SOCIAL HISTORY Social History   Tobacco Use  . Smoking status: Never Smoker  . Smokeless tobacco: Never Used  Substance Use Topics  . Alcohol use: No    Alcohol/week: 0.0 standard drinks  . Drug use: No         OPHTHALMIC EXAM:  Base Eye Exam    Visual Acuity (Snellen - Linear)      Right Left   Dist cc 20/70 -1 CF @ face   Dist ph cc NI NI  CF @ face eccentric OS       Tonometry (Tonopen, 9:15 AM)      Right Left   Pressure 14 16       Pupils      Dark Light Shape React APD   Right 4 3 Round Brisk 0   Left 4 3 Round Minimal 0       Extraocular Movement      Right Left    Full Full       Neuro/Psych    Oriented x3: Yes   Mood/Affect: Normal       Dilation    Both eyes: 1.0% Mydriacyl, 2.5% Phenylephrine @ 9:15 AM        Slit Lamp and Fundus Exam    Slit Lamp Exam      Right Left   Lids/Lashes Dermatochalasis - upper lid, mild Meibomian gland dysfunction Dermatochalasis - upper lid, Meibomian gland dysfunction    Conjunctiva/Sclera White and quiet White and quiet   Cornea Arcus, 3+ Punctate epithelial erosions Arcus, 3-4+ Punctate epithelial erosions, EBMD with 4x5DD irregular epi inferiorly, pigmented [place], mild corneal haze   Anterior Chamber Deep and quiet Deep and quiet   Iris Round and dilated, No NVI Round and dilated, No NVI, persistent pupillary membrane   Lens 2-3+ Nuclear sclerosis, 2+ Cortical cataract, Vacuoles, iridescent refractile opacities 3-4+ brunescent Nuclear sclerosis, 2+ Cortical cataract, Vacuoles, iridescent refractile opacities, 1+ Posterior subcapsular cataract   Vitreous Vitreous syneresis, mild Asteroid hyalosis/silicone oil bubbles superiorly, no residual TA Post vitrectomy, +pigment in anterior vitreous       Fundus Exam      Right Left   Disc Pink and Sharp, Compact, Sharp rim mild pallor, very thin rim, inferior PPA   C/D Ratio 0.5 0.9   Macula +central edema, persistent scattered MA/exudates Flat, good foveal reflex, scattered Microaneurysms, diffuse cystic changes   Vessels Vascular attenuation, Tortuous Attenuated   Periphery Attached, scattered DBH; light PRP 360 attached; good 360 PRP in place; IRH superiorly, scattered dot hemorrhages        Refraction    Wearing Rx      Sphere Cylinder Axis   Right -4.00 +1.50 178   Left -4.00 +1.25 009   Type: SVL          IMAGING AND PROCEDURES  Imaging and Procedures for 12/07/17  OCT, Retina - OU - Both Eyes       Right Eye Quality was borderline. Central Foveal Thickness: 398. Progression has improved. Findings include abnormal foveal contour, intraretinal fluid, vitreomacular adhesion , no SRF, outer retinal atrophy, intraretinal hyper-reflective material (Mild Interval decrease in IRF/IRHM).   Left Eye Quality was borderline. Central Foveal  Thickness: 337. Progression has worsened. Findings include intraretinal fluid, no SRF, outer retinal atrophy, intraretinal hyper-reflective material, abnormal foveal  contour (Interval increase in IRF -- diffuse DME).   Notes *Images captured and stored on drive  Diagnosis / Impression:  DME OU OD: Mild Interval decrease in IRF/IRHM OS: interval increase in IRF -- diffuse DME  Clinical management:  See below  Abbreviations: NFP - Normal foveal profile. CME - cystoid macular edema. PED - pigment epithelial detachment. IRF - intraretinal fluid. SRF - subretinal fluid. EZ - ellipsoid zone. ERM - epiretinal membrane. ORA - outer retinal atrophy. ORT - outer retinal tubulation. SRHM - subretinal hyper-reflective material         Intravitreal Injection, Pharmacologic Agent - OD - Right Eye       Time Out 02/08/2019. 11:11 AM. Confirmed correct patient, procedure, site, and patient consented.   Anesthesia Topical anesthesia was used. Anesthetic medications included Lidocaine 2%, Proparacaine 0.5%.   Procedure Preparation included 5% betadine to ocular surface, eyelid speculum. A 27 gauge needle was used.   Injection:  4 mg Triescence 40mg /mlL injection   NDC: [phone], Lot: [id number], Expiration date: 09/29/2020   Route: Intravitreal, Site: Right Eye, Waste: 0.9 mL  Post-op Post injection exam found visual acuity of at least counting fingers. The patient tolerated the procedure well. There were no complications. The patient received written and verbal post procedure care education.   Notes 0.1 cc of triescence (40mg /mL) injected 62mm posterior to limbus, inferotemporal quadrant, into vitreous cavity without complication.  An AC tap was performed following injection due to elevated IOP using a 30 gauge needle on a syringe with the plunger removed. The needle was placed at the limbus at 5 oclock and approximately 0.08 cc of aqueous was removed from the anterior chamber. Betadine was applied to the tap area before and after the paracentesis was performed. There were no complications. The patient tolerated the procedure well. The IOP was rechecked and  was found to be 8 mmHg by tonopen.         Intravitreal Injection, Pharmacologic Agent - OS - Left Eye       Time Out 02/08/2019. 11:12 AM. Confirmed correct patient, procedure, site, and patient consented.   Anesthesia Topical anesthesia was used. Anesthetic medications included Lidocaine 2%, Proparacaine 0.5%.   Procedure Preparation included 5% betadine to ocular surface, eyelid speculum. A supplied needle was used.   Injection:  1.25 mg Bevacizumab (AVASTIN) SOLN   NDC: 82956-213-08, Lot: [id number] , Expiration date: 03/01/2019   Route: Intravitreal, Site: Left Eye, Waste: 0 mL  Post-op Post injection exam found visual acuity of at least counting fingers. The patient tolerated the procedure well. There were no complications. The patient received written and verbal post procedure care education.   Notes An AC tap was performed following injection due to elevated IOP using a 30 gauge needle on a syringe with the plunger removed. The needle was placed at the limbus at 5 oclock and approximately 0.06cc of aqueous was removed from the anterior chamber. Betadine was applied to the tap area before and after the paracentesis was performed. There were no complications. The patient tolerated the procedure well. The IOP was rechecked and was found to be 7 mmHg by tonopen.                 ASSESSMENT/PLAN:    ICD-10-CM   1. Proliferative diabetic retinopathy of both eyes with macular edema associated with type 2 diabetes mellitus ([place])  [address] Intravitreal  Injection, Pharmacologic Agent - OD - Right Eye    Intravitreal Injection, Pharmacologic Agent - OS - Left Eye    triamcinolone acetonide (TRIESENCE) 40 MG/ML subtenons injection 4 mg    Bevacizumab (AVASTIN) SOLN 1.25 mg  2. Retinal edema  H35.81 OCT, Retina - OU - Both Eyes  3. Vitreous hemorrhage of left eye (HCC)  H43.12   4. Retinal detachment, tractional, left  H33.42   5. Essential hypertension  I10   6. Hypertensive  retinopathy of both eyes  H35.033   7. Combined forms of age-related cataract of both eyes  H25.813   8. Primary open angle glaucoma (POAG) of left eye, severe stage  H40.1123   9. Ocular hypertension of left eye  H40.052     1,2. Proliferative diabetic retinopathy with diabetic macular edema, OU  - initial FA 3.13.19 shows NVE OU  - OCT w/ diabetic macular edema, OU  - A1c 10.9% on 01/23/19 from 11.2% (04.20.20) from 11.7% (03.18.20)  - s/p IVA #1 OS (03.13.19), #2 (04.15.19), #3 (05.13.19), #4 (06.03.19), #5 (08.22.19), #6 (09.19.19), #7 (10.17.19)  - s/p IVA #1 OD (03.15.19), #2 (04.15.19), #3 (05.13.19), #4 (07.08.19)   - S/P IVE #1 OD (08.22.19) -- sample, #2 (09.19.19), #3 (10.17.19), #4 (03.25.20), #5 (04.22.20)  - S/P IVTA OD #1 (05.20.20)  - s/p PRP OS (04.05.19), PRP fill-in OS (04.30.19)  - s/p PRP OD (03.25.20)  - s/p 25g PPV/MP/EL/Gas OS under general anesthesia for VH and TRD repair OS (06.06.19)  - lost to follow up following 11.14.19 visit due to health and insurance reasons  - repeat FA (3.25.2020) shows persistent NV OD (nasal)  - today, BCVA 20/70 OD, CF OS (OS down from 20/40 on 11.14.19)  - OCT today shows interval decrease in IRF OD; interval increase in IRF OS  - review of OCTs shows some resistance to IVA and IVE OD  - switch to intravitreal steroid OD on 5.20.2020  - recommend IVT OD #2 and IVA OS #8 today, 7.1.2020  - pt wishes to proceed  - RBA of procedure discussed, questions answered  - informed consent obtained and signed  - see procedure notes  - of note, AC taps performed post-injection OU  - F/U 6 wks, DFE, OCT  3. History of Vitreous hemorrhage OS -- resolved  - secondary to #1, above  - s/p IVA OS as above  - s/p PRP OS (04.05.19 and 04.30.19)  - of note pt on warfarin   - s/p PPV w/ laser as above (6.6.19)  4. 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 Hypertensive  retinopathy OU  - discussed importance of tight BP control  - monitor  7. Combined form age-related cataract OU-   - The symptoms of cataract, surgical options, and treatments and risks were discussed with patient.  - discussed diagnosis and progression  - significant cataract progression OS following PPV  - referred to [facility] for cat eval and management   - surgery scheduled for January 12, 2019  8,9. Ocular hypertension / POAG OS  - severe stage -- at previous visits (2010), c/d was 0.5 and BCVA was 20/40; on 3.25.2020, c/d was 0.9 w/ disc pallor, and BCVA HM  - suspect majority of decreased BCVA OS is due to glaucoma, but significant contributions probably from cataract and EBMD/keratopathy OS  - IOP OD: 14, OS: 16 -- history of questionable compliance  with drops  - discussed importance of IOP control and compliance with drops  - continue brimonidine and cosopt BID OU   - now under the expert management of Dr. Midge Aver  - of note, AC taps performed post-injection OU today    Ophthalmic Meds Ordered this visit:  Meds ordered this encounter  Medications  . triamcinolone acetonide (TRIESENCE) 40 MG/ML subtenons injection 4 mg  . Bevacizumab (AVASTIN) SOLN 1.25 mg       Return in about 6 weeks (around 03/22/2019) for PDR OU, DFE, OCT.  There are no Patient Instructions on file for this visit.   Explained the diagnoses, plan, and follow up with the patient and they expressed understanding.  Patient expressed understanding of the importance of proper follow up care.   This document serves as a record of services personally performed by Gardiner Sleeper, MD, PhD. It was created on their behalf by Ernest Mallick, OA, an ophthalmic assistant. The creation of this record is the provider's dictation and/or activities during the visit.    Electronically signed by: Ernest Mallick, OA  06.30.2020 12:45 PM    Gardiner Sleeper, M.D., Ph.D. Diseases & Surgery of the Retina and  Vitreous [facility]  I have reviewed the above documentation for accuracy and completeness, and I agree with the above. Gardiner Sleeper, M.D., Ph.D. 02/08/19 12:45 PM    Abbreviations: M myopia (nearsighted); A astigmatism; H hyperopia (farsighted); P presbyopia; Mrx spectacle prescription;  CTL contact lenses; OD right eye; OS left eye; OU both eyes  XT exotropia; ET esotropia; PEK punctate epithelial keratitis; PEE punctate epithelial erosions; DES dry eye syndrome; MGD meibomian gland dysfunction; ATs artificial tears; PFAT's preservative free artificial tears; [place] nuclear sclerotic cataract; PSC posterior subcapsular cataract; ERM epi-retinal membrane; PVD posterior vitreous detachment; RD retinal detachment; DM diabetes mellitus; DR diabetic retinopathy; NPDR non-proliferative diabetic retinopathy; PDR proliferative diabetic retinopathy; CSME clinically significant macular edema; DME diabetic macular edema; dbh dot blot hemorrhages; CWS cotton wool spot; POAG primary open angle glaucoma; C/D cup-to-disc ratio; HVF humphrey visual field; GVF goldmann visual field; OCT optical coherence tomography; IOP intraocular pressure; BRVO Branch retinal vein occlusion; CRVO central retinal vein occlusion; CRAO central retinal artery occlusion; BRAO branch retinal artery occlusion; RT retinal tear; SB scleral buckle; PPV pars plana vitrectomy; VH Vitreous hemorrhage; PRP panretinal laser photocoagulation; IVK intravitreal kenalog; VMT vitreomacular traction; MH Macular hole;  NVD neovascularization of the disc; NVE neovascularization elsewhere; AREDS age related eye disease study; ARMD age related macular degeneration; POAG primary open angle glaucoma; EBMD epithelial/anterior basement membrane dystrophy; ACIOL anterior chamber intraocular lens; IOL intraocular lens; PCIOL posterior chamber intraocular lens; Phaco/IOL phacoemulsification with intraocular lens placement; [place] photorefractive  keratectomy; LASIK laser assisted in situ keratomileusis; HTN hypertension; DM diabetes mellitus; COPD chronic obstructive pulmonary disease

## 2019-02-08 ENCOUNTER — Encounter (INDEPENDENT_AMBULATORY_CARE_PROVIDER_SITE_OTHER): Payer: Self-pay | Admitting: Ophthalmology

## 2019-02-08 ENCOUNTER — Other Ambulatory Visit: Payer: Self-pay

## 2019-02-08 ENCOUNTER — Ambulatory Visit (INDEPENDENT_AMBULATORY_CARE_PROVIDER_SITE_OTHER): Payer: Medicare Other | Admitting: Ophthalmology

## 2019-02-08 DIAGNOSIS — H3342 Traction detachment of retina, left eye: Secondary | ICD-10-CM

## 2019-02-08 DIAGNOSIS — I10 Essential (primary) hypertension: Secondary | ICD-10-CM

## 2019-02-08 DIAGNOSIS — H3581 Retinal edema: Secondary | ICD-10-CM | POA: Diagnosis not present

## 2019-02-08 DIAGNOSIS — E113513 Type 2 diabetes mellitus with proliferative diabetic retinopathy with macular edema, bilateral: Secondary | ICD-10-CM | POA: Diagnosis not present

## 2019-02-08 DIAGNOSIS — H40052 Ocular hypertension, left eye: Secondary | ICD-10-CM

## 2019-02-08 DIAGNOSIS — H25813 Combined forms of age-related cataract, bilateral: Secondary | ICD-10-CM | POA: Diagnosis not present

## 2019-02-08 DIAGNOSIS — H4312 Vitreous hemorrhage, left eye: Secondary | ICD-10-CM

## 2019-02-08 DIAGNOSIS — H401123 Primary open-angle glaucoma, left eye, severe stage: Secondary | ICD-10-CM

## 2019-02-08 DIAGNOSIS — H35033 Hypertensive retinopathy, bilateral: Secondary | ICD-10-CM

## 2019-02-08 MED ORDER — TRIAMCINOLONE ACETONIDE 40 MG/ML IO SUSP
4.0000 mg | INTRAOCULAR | Status: AC | PRN
  Administered 2019-02-08: 4 mg via INTRAVITREAL

## 2019-02-08 MED ORDER — BEVACIZUMAB CHEMO INJECTION 1.25MG/0.05ML SYRINGE FOR KALEIDOSCOPE
1.2500 mg | INTRAVITREAL | Status: AC | PRN
  Administered 2019-02-08: 1.25 mg via INTRAVITREAL

## 2019-02-15 ENCOUNTER — Ambulatory Visit (INDEPENDENT_AMBULATORY_CARE_PROVIDER_SITE_OTHER): Payer: Medicare Other | Admitting: Student

## 2019-02-15 ENCOUNTER — Encounter: Payer: Self-pay | Admitting: Student

## 2019-02-15 ENCOUNTER — Other Ambulatory Visit: Payer: Self-pay

## 2019-02-15 VITALS — BP 146/62 | HR 57 | Temp 98.7°F | Ht 67.0 in | Wt 168.0 lb

## 2019-02-15 DIAGNOSIS — I2510 Atherosclerotic heart disease of native coronary artery without angina pectoris: Secondary | ICD-10-CM

## 2019-02-15 DIAGNOSIS — I5042 Chronic combined systolic (congestive) and diastolic (congestive) heart failure: Secondary | ICD-10-CM

## 2019-02-15 DIAGNOSIS — I10 Essential (primary) hypertension: Secondary | ICD-10-CM | POA: Diagnosis not present

## 2019-02-15 DIAGNOSIS — N184 Chronic kidney disease, stage 4 (severe): Secondary | ICD-10-CM

## 2019-02-15 DIAGNOSIS — I480 Paroxysmal atrial fibrillation: Secondary | ICD-10-CM

## 2019-02-15 DIAGNOSIS — E785 Hyperlipidemia, unspecified: Secondary | ICD-10-CM | POA: Diagnosis not present

## 2019-02-15 NOTE — Patient Instructions (Signed)
Medication Instructions: Your physician recommends that you continue on your current medications as directed. Please refer to the Current Medication list given to you today.   Labwork: None today  Procedures/Testing: None today  Follow-Up: In [place] at the [facility] center with a female provider. We will let you know  Any Additional Special Instructions Will Be Listed Below (If Applicable).   You have been referred to [facility] in [place]. They will call you to schedule an apt.   If you need a refill on your cardiac medications before your next appointment, please call your pharmacy.     Thank you for choosing [facility] !

## 2019-02-15 NOTE — Progress Notes (Signed)
Cardiology Office Note    Date:  02/15/2019   ID:  Jacki Couse, DOB 03-Nov-1955, MRN [id number]  PCP:  Azzie Glatter, FNP  Cardiologist: Kate Sable, MD  --> she now lives in [place], [place] and wishes to switch to a Cardiologist at our [facility] as this is more convenient for her from a transportation perspective  Chief Complaint  Patient presents with  . Hospitalization Follow-up    History of Present Illness:    Amber Young is a 63 y.o. female with past medical history of CAD (s/p prior stening of LAD in 2017, DES x2 to RCA in 2019), PAF, chronic combined systolic and diastolic CHF (EF 73-53% in 2019, at 25-30% by repeat echo in 01/2019), HTN, HLD, prior CVA, and Stage 4 CKD who presents to the office today for hospital follow-up.    She recently presented to [facility] ED on 01/23/2019 for evaluation of worsening dyspnea on exertion and edema, found to have an acute CHF exacerbation with BNP elevated to 2666 and hypertensive urgency thought to be due to medication noncompliance. Repeat echocardiogram showed her EF was further reduced to 25-30%. She was continued on Coreg and Amlodipine was discontinued and transitioned to Hydralazine and Isordil given her cardiomyopathy. Was not started on ACE-I/ARB/ARNI given her CKD and catheterization was not pursued given the high-risk of contrast induced nephropathy. Nephrology was consulted to assist with her diuretic regimen and this was transitioned to Lasix 80mg  BID and Metolazone 2.5mg  three times weekly. Creatinine was 3.63 at discharge on 01/30/2019.   In talking with the patient today, she reports overall doing well from a cardiac perspective since her recent hospitalization. Reports that her breathing has significantly improved and she denies any orthopnea, PND, or lower extremity edema. She has scales at home but says these do not work appropriately but weight has declined from 185 lbs at discharge to 168 lbs today.   Denies  any recent chest pain or palpitations. Reports good compliance with her current medication regimen. Says she had repeat labs obtained by her PCP last week and we will request these records. She has not yet followed up with Nephrology.   Past Medical History:  Diagnosis Date  . Anemia   . Chronic combined systolic and diastolic CHF (congestive heart failure) ([place])    a. EF 40-45% in 2019 b. EF 25-30% by repeat echo in 01/2019  . CKD (chronic kidney disease), stage IV ([place])   . Coronary artery disease 08/2017   DES x 2 RCA  . GERD (gastroesophageal reflux disease)   . Heart murmur   . Hyperlipidemia   . Hypertension   . Proliferative diabetic retinopathy ([place])    left eye with vitreous hemorrhage and tractional retinal detachment  . Stroke ([place]) 09/2014   numbness left upper lip, finger tips on left hand; "resolved" (05/18/2016)  . Type II diabetes mellitus ([place])   . Wears glasses     Past Surgical History:  Procedure Laterality Date  . CARDIAC CATHETERIZATION N/A 05/18/2016   Procedure: Left Heart Cath and Coronary Angiography;  Surgeon: Jettie Booze, MD;  Location: [facility];  Service: Cardiovascular;  Laterality: N/A;  . CARDIAC CATHETERIZATION N/A 05/18/2016   Procedure: Coronary Stent Intervention;  Surgeon: Jettie Booze, MD;  Location: [facility];  Service: Cardiovascular;  Laterality: N/A;  . COLONOSCOPY W/ BIOPSIES AND POLYPECTOMY    . CORONARY ANGIOPLASTY    . CORONARY ANGIOPLASTY WITH STENT PLACEMENT    .  CORONARY STENT INTERVENTION N/A 09/07/2017   Procedure: CORONARY STENT INTERVENTION;  Surgeon: Leonie Man, MD;  Location: [facility];  Service: Cardiovascular;  Laterality: N/A;  . EYE SURGERY    . GAS INSERTION Left 01/13/2018   Procedure: INSERTION OF GAS;  Surgeon: Bernarda Caffey, MD;  Location: [facility];  Service: Ophthalmology;  Laterality: Left;  . LEFT HEART CATH AND CORONARY ANGIOGRAPHY N/A 09/07/2017   Procedure: LEFT HEART CATH  AND CORONARY ANGIOGRAPHY;  Surgeon: Leonie Man, MD;  Location: [facility];  Service: Cardiovascular;  Laterality: N/A;  . MEMBRANE PEEL Left 01/13/2018   Procedure: MEMBRANE PEEL LEFT EYE ;  Surgeon: Bernarda Caffey, MD;  Location: [facility];  Service: Ophthalmology;  Laterality: Left;  Marland Kitchen MULTIPLE TOOTH EXTRACTIONS    . PARS PLANA VITRECTOMY Left 01/13/2018   Procedure: PARS PLANA VITRECTOMY WITH 25 GAUGE LEFT EYE WITH ENDOLASER;  Surgeon: Bernarda Caffey, MD;  Location: [facility];  Service: Ophthalmology;  Laterality: Left;  . TUBAL LIGATION  1980  . VAGINAL HYSTERECTOMY  1999   "partial; fibroids"    Current Medications: Outpatient Medications Prior to Visit  Medication Sig Dispense Refill  . acetaminophen (TYLENOL) 500 MG tablet Take 1,000 mg by mouth every 6 (six) hours as needed for mild pain or headache.    Marland Kitchen atorvastatin (LIPITOR) 40 MG tablet Take 1 tablet (40 mg total) by mouth daily at 6 PM. 90 tablet 1  . brimonidine (ALPHAGAN) 0.2 % ophthalmic solution Place 1 drop into the left eye 2 (two) times a day. 15 mL 10  . carvedilol (COREG) 25 MG tablet Take 1 tablet (25 mg total) by mouth 2 (two) times daily. 60 tablet 0  . dorzolamide-timolol (COSOPT) 22.3-6.8 MG/ML ophthalmic solution Place 1 drop into the left eye 2 (two) times daily.    . ferrous sulfate (FERROUSUL) 325 (65 FE) MG tablet Take 1 tablet (325 mg total) by mouth every other day. 90 tablet 3  . furosemide (LASIX) 80 MG tablet Take 1 tablet (80 mg total) by mouth 2 (two) times daily. 60 tablet 0  . glipiZIDE (GLUCOTROL) 10 MG tablet Take 1 tablet (10 mg total) by mouth 2 (two) times daily before a meal. 60 tablet 3  . Glucose Blood (BLOOD GLUCOSE TEST STRIPS) STRP Use as directed 200 each 0  . hydrALAZINE (APRESOLINE) 100 MG tablet Take 1 tablet (100 mg total) by mouth 3 (three) times daily. 90 tablet 0  . Insulin Pen Needle 29G X 10MM MISC Use as directed 100 each 3  . INSULIN SYRINGE .5CC/29G 29G X 1/2" 0.5 ML MISC Use  to administer insulin three times daily 200 each 5  . isosorbide dinitrate (ISORDIL) 20 MG tablet Take 1 tablet (20 mg total) by mouth 3 (three) times daily. 90 tablet 0  . metFORMIN (GLUCOPHAGE) 500 MG tablet Take 1 tablet (500 mg total) by mouth 2 (two) times daily with a meal. 60 tablet 3  . metolazone (ZAROXOLYN) 2.5 MG tablet Take 1 tablet (2.5 mg total) by mouth 3 (three) times a week. 30 tablet 0  . nitroGLYCERIN (NITROSTAT) 0.4 MG SL tablet Place 1 tablet (0.4 mg total) under the tongue every 5 (five) minutes x 3 doses as needed for chest pain. 30 tablet 12  . potassium chloride (K-DUR,KLOR-CON) 10 MEQ tablet Take 1 tablet with each dose of furosemide, daily. (Patient taking differently: Take 10 mEq by mouth 2 (two) times daily. Take 1 tablet with each dose of furosemide,  daily.) 30 tablet 3  . ticagrelor (BRILINTA) 90 MG TABS tablet Take 1 tablet (90 mg total) by mouth 2 (two) times daily. 180 tablet 3  . TRUEPLUS PEN NEEDLES 31G X 6 MM MISC USE AS DIRECTED 100 each 0  . warfarin (COUMADIN) 2.5 MG tablet Take 1 tablet (2.5 mg total) by mouth daily at 6 PM. 15 tablet 0   No facility-administered medications prior to visit.      Allergies:   Ace inhibitors   Social History   Socioeconomic History  . Marital status: Married    Spouse name: Not on file  . Number of children: Not on file  . Years of education: 79  . Highest education level: Not on file  Occupational History  . Occupation: hairdresser  Social Needs  . Financial resource strain: Not on file  . Food insecurity    Worry: Not on file    Inability: Not on file  . Transportation needs    Medical: Not on file    Non-medical: Not on file  Tobacco Use  . Smoking status: Never Smoker  . Smokeless tobacco: Never Used  Substance and Sexual Activity  . Alcohol use: No    Alcohol/week: 0.0 standard drinks  . Drug use: No  . Sexual activity: Yes  Lifestyle  . Physical activity    Days per week: Not on file    Minutes  per session: Not on file  . Stress: Not on file  Relationships  . Social Herbalist on phone: Not on file    Gets together: Not on file    Attends religious service: Not on file    Active member of club or organization: Not on file    Attends meetings of clubs or organizations: Not on file    Relationship status: Not on file  Other Topics Concern  . Not on file  Social History Narrative   Married, 2 children, hairdresser   Caffeine use- occasional tea or coffee   Right handed     Family History:  The patient's family history includes COPD in her mother; Diabetes in her mother; Heart failure in her mother; Hypertension in her brother, mother, and sister.   Review of Systems:   Please see the history of present illness.     General:  No chills, fever, night sweats or weight changes.  Cardiovascular:  No chest pain, dyspnea on exertion, orthopnea, palpitations, paroxysmal nocturnal dyspnea. Positive for edema (now improved).  Dermatological: No rash, lesions/masses Respiratory: No cough, dyspnea Urologic: No hematuria, dysuria Abdominal:   No nausea, vomiting, diarrhea, bright red blood per rectum, melena, or hematemesis Neurologic:  No visual changes, wkns, changes in mental status. All other systems reviewed and are otherwise negative except as noted above.   Physical Exam:    VS:  BP (!) 146/62   Pulse (!) 57   Temp 98.7 F (37.1 C)   Ht 5\' 7"  (1.702 m)   Wt 168 lb (76.2 kg)   SpO2 95%   BMI 26.31 kg/m    General: Well developed, well nourished,female appearing in no acute distress. Head: Normocephalic, atraumatic, sclera non-icteric, no xanthomas, nares are without discharge.  Neck: No carotid bruits. JVD not elevated.  Lungs: Respirations regular and unlabored, without wheezes or rales.  Heart: Regular rate and rhythm. No S3 or S4.  No murmur, no rubs, or gallops appreciated. Abdomen: Soft, non-tender, non-distended with normoactive bowel sounds. No  hepatomegaly. No rebound/guarding. No obvious abdominal  masses. Msk:  Strength and tone appear normal for age. No joint deformities or effusions. Extremities: No clubbing or cyanosis. Trace ankle edema bilaterally.  Distal pedal pulses are 2+ bilaterally. Neuro: Alert and oriented X 3. Moves all extremities spontaneously. No focal deficits noted. Psych:  Responds to questions appropriately with a normal affect. Skin: No rashes or lesions noted  Wt Readings from Last 3 Encounters:  02/15/19 168 lb (76.2 kg)  02/06/19 167 lb (75.8 kg)  01/30/19 182 lb 12.8 oz (82.9 kg)     Studies/Labs Reviewed:   EKG:  EKG is not ordered today.   Recent Labs: 01/23/2019: B Natriuretic Peptide 5,956.3 01/24/2019: ALT 14 01/27/2019: Magnesium 1.9 01/30/2019: BUN 39; Creatinine, Ser 3.63; Hemoglobin 11.1; Platelets 360; Potassium 3.7; Sodium 137   Lipid Panel    Component Value Date/Time   CHOL 217 (H) 01/28/2019 0536   TRIG 59 01/28/2019 0536   HDL 75 01/28/2019 0536   CHOLHDL 2.9 01/28/2019 0536   VLDL 12 01/28/2019 0536   LDLCALC 130 (H) 01/28/2019 0536    Additional studies/ records that were reviewed today include:   Cardiac Catheterization: 08/2017  Prox RCA lesion is 75% stenosed.  A drug-eluting stent was successfully placed using a STENT PROMUS PREM MR 2.75X16.  Post intervention, there is a 5% residual stenosis.  Ost RCA lesion is 85% stenosed.  A drug-eluting stent was successfully placed using a STENT PROMUS PREM MR 3.0X12. Overlaps Stent 1 proximally.  Post intervention, there is a 0% residual stenosis.  Mid RCA-1 lesion is 30% stenosed. Mid RCA-2 lesion is 25% stenosed.  Ost 1st Diag lesion is 30% stenosed. Ost 2nd Diag to 2nd Diag lesion is 80% stenosed. Stable.  Prox LAD lesion is 40% stenosed. Previously placed Mid LAD Promus drug eluting stent is widely patent.  There is mild left ventricular systolic dysfunction. The left ventricular ejection fraction is 45-50% by  visual estimate.  LV end diastolic pressure is mildly elevated.   Severe Ostial & proximal RCA stenosis (progression of disease from Oct 2017).  Successful PCI using 2 overlapping DES.  Difficult, ostial lesion PCI requiring multiple balloons, buddy wire & extra support wire.  Plan:   Overnight monitoring in the posterior unit (6C)  TR band removal per protocol  Continue DAPT -at least 1 more year\  Continue aggressive risk factor modification.   Echocardiogram: 01/24/2019 IMPRESSIONS    1. The left ventricle has severely reduced systolic function, with an ejection fraction of 25-30%. The cavity size was normal. There is mildly increased left ventricular wall thickness. Left ventricular diastolic Doppler parameters are consistent with  pseudonormalization. Left ventricular diffuse hypokinesis.  2. The right ventricle has mildly reduced systolic function. The cavity was mildly enlarged. Right ventricular systolic pressure is moderately elevated with an estimated pressure of 43.3 mmHg.  3. Left atrial size was moderately dilated.  4. Right atrial size was mildly dilated.  5. The mitral valve is grossly normal. Mild thickening of the mitral valve leaflet. There is mild mitral annular calcification present.  6. The tricuspid valve is grossly normal. Tricuspid valve regurgitation is moderate.  7. The aortic valve is tricuspid. Mild thickening of the aortic valve. No stenosis of the aortic valve.  8. Global hypokinesis with akinesis of the mid septum; overall severe LV dysfunction; mild LVH; moderate diastolic dysfunction; mild MR; biatrial enlargement; mild RVE with mild RV dysfunction; moderate TR; moderate pulmonary hypertension.  Assessment:    1. Coronary artery disease involving native coronary artery of  native heart without angina pectoris   2. Chronic combined systolic and diastolic heart failure ([place])   3. Essential hypertension   4. Hyperlipidemia LDL goal <70   5. PAF  (paroxysmal atrial fibrillation) ([place])   6. CKD (chronic kidney disease) stage 4, GFR 15-29 ml/min ([facility])      Plan:   In order of problems listed above:  1. CAD - s/p prior stening of LAD in 2017 with DES x2 to RCA in 08/2017. Repeat echo showed EF was further reduced to 25-30% but repeat catheterization was not pursued given her worsening renal function and the high-risk of contrast induced nephropathy.  - she denies any recent chest pain and her breathing has returned to baseline.  - continue Brilinta (has been continued on this at the discretion of her Primary Cardiologist given her stent burden), Coreg, Imdur, and statin therapy.   2. Chronic Combined Systolic and Diastolic CHF - EF previously 40-45% in 2019, at 25-30% by repeat echo in 01/2019. Volume status has overall been stable since discharge and she denies any recent orthopnea, PND, or edema. Weight has declined by over 15 lbs since hospital discharge.  - remains on Lasix 80mg  BID and Metolazone 2.5mg  three times weekly. Reports having labs by her PCP last week and will request these records. Will refer to Nephrology as she has not yet followed up with them since discharge. Continue Hydralazine, Isordil, and Coreg. Sodium and fluid restriction reviewed. Will reach out to Social Work to see if a set of scales can be sent to the patient.    3. HTN - BP at 146/62 during today's visit yet she reports having not yet taken her afternoon Hydralazine or Isordil. Encouraged her to continue to follow BP and keep a log of her readings.   4. HLD - LDL elevated at 130 in 01/2019 but she reported intermittent compliance with her medications at that time. Remains on Atorvastatin 40mg  daily. Will need follow-up labs in 6-8 weeks. If not at goal, would further titrate to 80mg  daily or consider switching to Crestor.   5. Paroxysmal Atrial Fibrillation - she denies any recent palpitations. In NSR by examination today. Continue Coreg for  rate-control. - denies any evidence of active bleeding. Hgb 11.1 at the time of recent admission. Continue Coumadin for anticoagulation. Reports INR is now followed by her PCP since she moved to [place].   6. Stage 4 CKD - creatinine was 3.63 at discharge on 01/30/2019. Was followed by [facility] during admission but has not established outpatient follow-up. Will enter referral today. Importance of keeping follow-up strongly encouraged.   The patient wishes to transfer her care from our [facility] to the [facility] as she now lives in [place]. Will ask scheduling to assist with arranging this transfer.    Medication Adjustments/Labs and Tests Ordered: Current medicines are reviewed at length with the patient today.  Concerns regarding medicines are outlined above.  Medication changes, Labs and Tests ordered today are listed in the Patient Instructions below. Patient Instructions  Medication Instructions: Your physician recommends that you continue on your current medications as directed. Please refer to the Current Medication list given to you today.   Labwork: None today  Procedures/Testing: None today  Follow-Up: In [place] at the [facility].   Any Additional Special Instructions Will Be Listed Below (If Applicable).   You have been referred to [facility] in [place]. They will call you to schedule an apt.   If you need a  refill on your cardiac medications before your next appointment, please call your pharmacy.     Thank you for choosing [facility] !         Signed, Erma Heritage, PA-C  02/15/2019 7:50 PM     [facility] [address] [place], [place] [address] Phone: [phone] Fax: [phone]

## 2019-02-16 ENCOUNTER — Telehealth: Payer: Self-pay | Admitting: Licensed Clinical Social Worker

## 2019-02-16 ENCOUNTER — Ambulatory Visit: Payer: Medicare Other | Admitting: Student

## 2019-02-16 NOTE — Telephone Encounter (Signed)
CSW referred to assist patient with obtaining a scale. CSW contacted patient to inform cuff will be delivered to home although no answer. Message left. CSW available as needed. Raquel Sarna, [place], [place]

## 2019-02-17 MED FILL — OFLOXACIN 0.3 % SOLN: 0.3 | 18 days supply | Qty: 5 | Fill #0

## 2019-02-17 MED FILL — KETOROLAC 0.5% OPHTH SOLN: 0.5 | 18 days supply | Qty: 5 | Fill #0

## 2019-02-17 MED FILL — PREDNISOLONE AC 1% EYE DROP: 1 | 37 days supply | Qty: 10 | Fill #0

## 2019-02-22 ENCOUNTER — Ambulatory Visit: Payer: Medicare Other | Admitting: Family Medicine

## 2019-03-12 DIAGNOSIS — H2511 Age-related nuclear cataract, right eye: Secondary | ICD-10-CM | POA: Diagnosis not present

## 2019-03-14 ENCOUNTER — Ambulatory Visit: Payer: Medicare Other | Admitting: Family Medicine

## 2019-03-22 ENCOUNTER — Encounter (INDEPENDENT_AMBULATORY_CARE_PROVIDER_SITE_OTHER): Payer: Medicare Other | Admitting: Ophthalmology

## 2019-03-24 ENCOUNTER — Ambulatory Visit: Payer: Medicare Other | Admitting: Family Medicine

## 2019-03-31 ENCOUNTER — Ambulatory Visit: Payer: Medicare Other | Admitting: Gastroenterology

## 2019-04-03 ENCOUNTER — Other Ambulatory Visit: Payer: Self-pay | Admitting: Family Medicine

## 2019-04-03 ENCOUNTER — Encounter (INDEPENDENT_AMBULATORY_CARE_PROVIDER_SITE_OTHER): Payer: Medicare Other | Admitting: Ophthalmology

## 2019-04-03 MED FILL — LOSARTAN POTASSIUM 100 MG T: 100 | 30 days supply | Qty: 30 | Fill #0

## 2019-04-04 DIAGNOSIS — D631 Anemia in chronic kidney disease: Secondary | ICD-10-CM | POA: Diagnosis not present

## 2019-04-04 DIAGNOSIS — N189 Chronic kidney disease, unspecified: Secondary | ICD-10-CM | POA: Diagnosis not present

## 2019-04-04 DIAGNOSIS — N2581 Secondary hyperparathyroidism of renal origin: Secondary | ICD-10-CM | POA: Diagnosis not present

## 2019-04-04 DIAGNOSIS — I129 Hypertensive chronic kidney disease with stage 1 through stage 4 chronic kidney disease, or unspecified chronic kidney disease: Secondary | ICD-10-CM | POA: Diagnosis not present

## 2019-04-04 DIAGNOSIS — N184 Chronic kidney disease, stage 4 (severe): Secondary | ICD-10-CM | POA: Diagnosis not present

## 2019-04-06 ENCOUNTER — Other Ambulatory Visit: Payer: Self-pay | Admitting: Family Medicine

## 2019-04-06 MED FILL — GLIMEPIRIDE 4 MG TABS: 4 | 30 days supply | Qty: 30 | Fill #1

## 2019-04-06 MED FILL — FUROSEMIDE 40 MG TAB: 40 | 30 days supply | Qty: 60 | Fill #1

## 2019-04-13 MED FILL — VIT D2 1.25 MG (50,000 UNIT: 1.25 MG | 28 days supply | Qty: 4 | Fill #0

## 2019-04-20 ENCOUNTER — Other Ambulatory Visit: Payer: Self-pay

## 2019-04-20 ENCOUNTER — Ambulatory Visit (INDEPENDENT_AMBULATORY_CARE_PROVIDER_SITE_OTHER): Payer: Medicare Other | Admitting: Cardiovascular Disease

## 2019-04-20 ENCOUNTER — Encounter: Payer: Self-pay | Admitting: Cardiovascular Disease

## 2019-04-20 VITALS — HR 98 | Temp 98.2°F | Ht 66.0 in | Wt 172.0 lb

## 2019-04-20 DIAGNOSIS — I2510 Atherosclerotic heart disease of native coronary artery without angina pectoris: Secondary | ICD-10-CM

## 2019-04-20 DIAGNOSIS — E7800 Pure hypercholesterolemia, unspecified: Secondary | ICD-10-CM | POA: Diagnosis not present

## 2019-04-20 DIAGNOSIS — I5043 Acute on chronic combined systolic (congestive) and diastolic (congestive) heart failure: Secondary | ICD-10-CM | POA: Diagnosis not present

## 2019-04-20 DIAGNOSIS — E1159 Type 2 diabetes mellitus with other circulatory complications: Secondary | ICD-10-CM | POA: Diagnosis not present

## 2019-04-20 DIAGNOSIS — Z5181 Encounter for therapeutic drug level monitoring: Secondary | ICD-10-CM

## 2019-04-20 DIAGNOSIS — I480 Paroxysmal atrial fibrillation: Secondary | ICD-10-CM

## 2019-04-20 DIAGNOSIS — I10 Essential (primary) hypertension: Secondary | ICD-10-CM

## 2019-04-20 MED ORDER — ATORVASTATIN CALCIUM 40 MG PO TABS: 40.0000 mg | ORAL_TABLET | Freq: Every day | ORAL | 1 refills | Status: DC

## 2019-04-20 NOTE — Addendum Note (Signed)
Addended by: Alvina Filbert B on: 04/20/2019 02:45 PM   Modules accepted: Orders

## 2019-04-20 NOTE — Progress Notes (Signed)
Cardiology Office Note   Date:  04/20/2019   ID:  Carle Vandaele, DOB 02-28-1956, MRN [id number]  PCP:  Azzie Glatter, FNP  Cardiologist:   Skeet Latch, MD   No chief complaint on file.    History of Present Illness: Amber Young is a 63 y.o. 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  She was previously a patient of Dr. Bronson Ing but moved to [place] from [place].  She was admitted to the hospital 01/2019 with acute heart failure exacerbation.  BNP was elevated to 2666 in the setting of hypertensive urgency due to medication noncompliance.  She had an echo at that time that revealed LVEF 25 to 30% which was reduced from 40 to 45% previously.  She was not started on an ARB/ARN I due to her chronic kidney disease.  Cardiac catheterization was not pursued due to her renal dysfunction and lack of chest pain.  She is continue to follow-up with nephrology and last saw Dr. Graylon Gunning last week.  She thinks that her kidney function has been pretty stable but she is unsure.  Today she reports increasing episodes of palpitations at night.  She attributes this to atrial fibrillation.  She also wonders if it is due to the ticagrelor.  She was told that if she drinks caffeine it would improve.  She has not noted any improvement in her breathing despite drinking Coke.  Overall she is feeling poorly.  She has been more short of breath, has more lower extremity edema and generally feels fatigued.  This is been ongoing for the last 2 weeks.  She does not check her weight regularly.  She does not notice any increased urinary output when she takes Lasix, however on the days when she takes metolazone she does have a robust urinary response.   Past Medical History:  Diagnosis Date  . Anemia   . Chronic  combined systolic and diastolic CHF (congestive heart failure) ([place])    a. EF 40-45% in 2019 b. EF 25-30% by repeat echo in 01/2019  . CKD (chronic kidney disease), stage IV ([place])   . Coronary artery disease 08/2017   DES x 2 RCA  . GERD (gastroesophageal reflux disease)   . Heart murmur   . Hyperlipidemia   . Hypertension   . Proliferative diabetic retinopathy ([place])    left eye with vitreous hemorrhage and tractional retinal detachment  . Stroke ([place]) 09/2014   numbness left upper lip, finger tips on left hand; "resolved" (05/18/2016)  . Type II diabetes mellitus ([employer])   . Wears glasses     Past Surgical History:  Procedure Laterality Date  . CARDIAC CATHETERIZATION N/A 05/18/2016   Procedure: Left Heart Cath and Coronary Angiography;  Surgeon: Jettie Booze, MD;  Location: [facility];  Service: Cardiovascular;  Laterality: N/A;  . CARDIAC CATHETERIZATION N/A 05/18/2016   Procedure: Coronary Stent Intervention;  Surgeon: Jettie Booze, MD;  Location: [facility];  Service: Cardiovascular;  Laterality: N/A;  . COLONOSCOPY W/ BIOPSIES AND POLYPECTOMY    . CORONARY ANGIOPLASTY    . CORONARY ANGIOPLASTY WITH STENT PLACEMENT    . CORONARY STENT INTERVENTION N/A 09/07/2017   Procedure: CORONARY STENT INTERVENTION;  Surgeon: Leonie Man, MD;  Location: [facility];  Service: Cardiovascular;  Laterality: N/A;  .  EYE SURGERY    . GAS INSERTION Left 01/13/2018   Procedure: INSERTION OF GAS;  Surgeon: Bernarda Caffey, MD;  Location: [facility];  Service: Ophthalmology;  Laterality: Left;  . LEFT HEART CATH AND CORONARY ANGIOGRAPHY N/A 09/07/2017   Procedure: LEFT HEART CATH AND CORONARY ANGIOGRAPHY;  Surgeon: Leonie Man, MD;  Location: [facility];  Service: Cardiovascular;  Laterality: N/A;  . MEMBRANE PEEL Left 01/13/2018   Procedure: MEMBRANE PEEL LEFT EYE ;  Surgeon: Bernarda Caffey, MD;  Location: [facility];  Service: Ophthalmology;  Laterality: Left;  Marland Kitchen  MULTIPLE TOOTH EXTRACTIONS    . PARS PLANA VITRECTOMY Left 01/13/2018   Procedure: PARS PLANA VITRECTOMY WITH 25 GAUGE LEFT EYE WITH ENDOLASER;  Surgeon: Bernarda Caffey, MD;  Location: [facility];  Service: Ophthalmology;  Laterality: Left;  . TUBAL LIGATION  1980  . VAGINAL HYSTERECTOMY  1999   "partial; fibroids"     Current Outpatient Medications  Medication Sig Dispense Refill  . acetaminophen (TYLENOL) 500 MG tablet Take 1,000 mg by mouth every 6 (six) hours as needed for mild pain or headache.    Marland Kitchen atorvastatin (LIPITOR) 40 MG tablet Take 1 tablet (40 mg total) by mouth daily at 6 PM. 90 tablet 1  . brimonidine (ALPHAGAN) 0.2 % ophthalmic solution Place 1 drop into the left eye 2 (two) times a day. 15 mL 10  . carvedilol (COREG) 25 MG tablet Take 1 tablet (25 mg total) by mouth 2 (two) times daily. 60 tablet 0  . dorzolamide-timolol (COSOPT) 22.3-6.8 MG/ML ophthalmic solution Place 1 drop into the left eye 2 (two) times daily.    . ferrous sulfate (FERROUSUL) 325 (65 FE) MG tablet Take 1 tablet (325 mg total) by mouth every other day. 90 tablet 3  . furosemide (LASIX) 80 MG tablet Take 1 tablet (80 mg total) by mouth 2 (two) times daily. 60 tablet 0  . glipiZIDE (GLUCOTROL) 10 MG tablet Take 1 tablet (10 mg total) by mouth 2 (two) times daily before a meal. 60 tablet 3  . Glucose Blood (BLOOD GLUCOSE TEST STRIPS) STRP Use as directed 200 each 0  . hydrALAZINE (APRESOLINE) 100 MG tablet Take 1 tablet (100 mg total) by mouth 3 (three) times daily. 90 tablet 0  . Insulin Pen Needle 29G X 10MM MISC Use as directed 100 each 3  . INSULIN SYRINGE .5CC/29G 29G X 1/2" 0.5 ML MISC Use to administer insulin three times daily 200 each 5  . isosorbide dinitrate (ISORDIL) 20 MG tablet Take 1 tablet (20 mg total) by mouth 3 (three) times daily. 90 tablet 0  . losartan (COZAAR) 100 MG tablet TAKE 1 TABLET BY MOUTH DAILY. 30 tablet 1  . metFORMIN (GLUCOPHAGE) 500 MG tablet Take 1 tablet (500 mg total) by mouth  2 (two) times daily with a meal. 60 tablet 3  . metolazone (ZAROXOLYN) 2.5 MG tablet Take 1 tablet (2.5 mg total) by mouth 3 (three) times a week. 30 tablet 0  . nitroGLYCERIN (NITROSTAT) 0.4 MG SL tablet Place 1 tablet (0.4 mg total) under the tongue every 5 (five) minutes x 3 doses as needed for chest pain. 30 tablet 12  . potassium chloride (K-DUR,KLOR-CON) 10 MEQ tablet Take 1 tablet with each dose of furosemide, daily. (Patient taking differently: Take 10 mEq by mouth 2 (two) times daily. Take 1 tablet with each dose of furosemide, daily.) 30 tablet 3  . ticagrelor (BRILINTA) 90 MG TABS tablet Take 1 tablet (90 mg total)  by mouth 2 (two) times daily. 180 tablet 3  . TRUEPLUS PEN NEEDLES 31G X 6 MM MISC USE AS DIRECTED 100 each 0  . warfarin (COUMADIN) 2.5 MG tablet Take 1 tablet (2.5 mg total) by mouth daily at 6 PM. 15 tablet 0   No current facility-administered medications for this visit.     Allergies:   Ace inhibitors    Social History:  The patient  reports that she has never smoked. She has never used smokeless tobacco. She reports that she does not drink alcohol or use drugs.   Family History:  The patient's family history includes COPD in her mother; Diabetes in her mother; Heart failure in her mother; Hypertension in her brother, mother, and sister.    ROS:  Please see the history of present illness.   Otherwise, review of systems are positive for none.   All other systems are reviewed and negative.    PHYSICAL EXAM: VS:  Pulse 98   Temp 98.2 F (36.8 C) (Temporal)   Ht 5\' 6"  (1.676 m)   Wt 172 lb (78 kg)   BMI 27.76 kg/m  , BMI Body mass index is 27.76 kg/m. GENERAL:  Chronically ill-appearing.  No acute distress HEENT:  Pupils equal round and reactive, fundi not visualized, oral mucosa unremarkable NECK:  JVP to mid neck at 45 degrees.  +HJR.  Waveform within normal limits, carotid upstroke brisk and symmetric, no bruits LUNGS:  Clear to auscultation bilaterally  HEART:  RRR.  PMI not displaced or sustained,S1 and S2 within normal limits, no S3, no S4, no clicks, no rubs, no murmurs ABD:  Flat, positive bowel sounds normal in frequency in pitch, no bruits, no rebound, no guarding, no midline pulsatile mass, no hepatomegaly, no splenomegaly EXT:  2 plus pulses throughout, 2+ LE edema to mid tibia, no cyanosis no clubbing SKIN:  No rashes no nodules NEURO:  Cranial nerves II through XII grossly intact, motor grossly intact throughout PSYCH:  Cognitively intact, oriented to person place and time   EKG:  EKG is not ordered today.   Recent Labs: 01/23/2019: B Natriuretic Peptide [id number] 01/24/2019: ALT 14 01/27/2019: Magnesium 1.9 01/30/2019: BUN 39; Creatinine, Ser 3.63; Hemoglobin 11.1; Platelets 360; Potassium 3.7; Sodium 137    Lipid Panel    Component Value Date/Time   CHOL 217 (H) 01/28/2019 0536   TRIG 59 01/28/2019 0536   HDL 75 01/28/2019 0536   CHOLHDL 2.9 01/28/2019 0536   VLDL 12 01/28/2019 0536   LDLCALC 130 (H) 01/28/2019 0536      Wt Readings from Last 3 Encounters:  04/20/19 172 lb (78 kg)  02/15/19 168 lb (76.2 kg)  02/06/19 167 lb (75.8 kg)      ASSESSMENT AND PLAN:  # Acute on chronic systolic and diastolic heart failure: LVEF reduced to 20-25%.  She has ischemic cardiomyopathy and a history of poorly controlled BP.  She is volume overloaded on exam and short of breath.  Weight is up 5lb since her last appointment.  We will increase lasix to 120mg  in the am and 80mg  in the afternoon.  She will take an exra metolazone today.   BMP next week.  She will track her BP at home.   We briefly discussed ICD.  When she is more euvolemic we will repeat her echo.  We will also have to have more discussions on management of heart failure symptoms.  Continue carvedilol, hydralazine, isosorbide dinitrate, and losartan.  # CAD: She had two  DESs placed in the RCA 08/2017 and one in the LAD in 2017.  Currently chest pain free.  She has  moderate residual disease that is medically managed.  She did not undergo repeat cardiac catheterization at the time of her last heart failure exacerbation due to her renal dysfunction.  Continue carvedilol, Isordil, and atorvastatin.  She has shortness of breath that may be partially due to ticagrelor.  Will reassess once she is euvolemic.  # CKD IV: Ms. Forestier would get HD if necessary.  She follows with Dr. Elmarie Shiley.   # PAF: Continue carvedilol and warfarin.  Currently in sinus rhythm.    Current medicines are reviewed at length with the patient today.  The patient does not have concerns regarding medicines.  The following changes have been made:  Increase lasix.  Extra dose of metolazone today.   Labs/ tests ordered today include:  No orders of the defined types were placed in this encounter.    Disposition:   FU with .APP in 1 week.       Signed, Maxamus Colao C. Oval Linsey, MD, [facility]  04/20/2019 11:47 AM    [place]

## 2019-04-20 NOTE — Patient Instructions (Addendum)
Medication Instructions:  INCREASE YOUR FUROSEMIDE TO 1 AND 1/2 TABLETS IN AM AND 1 IN AFTERNOON   TAKE A METOLAZONE TODAY   If you need a refill on your cardiac medications before your next appointment, please call your pharmacy.   Lab work: BMET Tuesday   If you have labs (blood work) drawn today and your tests are completely normal, you will receive your results only by: Marland Kitchen MyChart Message (if you have MyChart) OR . A paper copy in the mail If you have any lab test that is abnormal or we need to change your treatment, we will call you to review the results.  Testing/Procedures: NONE  Follow-Up: IN OFFICE VISIT/VIRTUAL VISIT WITH DR [place]is [place]/NP/PA 1 WEEK

## 2019-04-25 ENCOUNTER — Other Ambulatory Visit: Payer: Self-pay

## 2019-04-25 ENCOUNTER — Encounter: Payer: Self-pay | Admitting: Physician Assistant

## 2019-04-25 ENCOUNTER — Ambulatory Visit (INDEPENDENT_AMBULATORY_CARE_PROVIDER_SITE_OTHER): Payer: Medicare Other | Admitting: General Practice

## 2019-04-25 VITALS — BP 138/62 | HR 64 | Temp 96.8°F | Ht 66.5 in | Wt 170.0 lb

## 2019-04-25 DIAGNOSIS — I2510 Atherosclerotic heart disease of native coronary artery without angina pectoris: Secondary | ICD-10-CM | POA: Diagnosis not present

## 2019-04-25 DIAGNOSIS — I5043 Acute on chronic combined systolic (congestive) and diastolic (congestive) heart failure: Secondary | ICD-10-CM | POA: Diagnosis not present

## 2019-04-25 DIAGNOSIS — I10 Essential (primary) hypertension: Secondary | ICD-10-CM | POA: Diagnosis not present

## 2019-04-25 DIAGNOSIS — I480 Paroxysmal atrial fibrillation: Secondary | ICD-10-CM | POA: Diagnosis not present

## 2019-04-25 DIAGNOSIS — Z5181 Encounter for therapeutic drug level monitoring: Secondary | ICD-10-CM | POA: Diagnosis not present

## 2019-04-25 NOTE — Progress Notes (Signed)
Cardiology Clinic Note   Patient Name: Amber Young Date of Encounter: 04/25/2019  Primary Care Provider:  Azzie Glatter, FNP Primary Cardiologist:  Skeet Latch, MD  Patient Profile    Amber Young 63 year old female presents today for follow-up of her ischemic cardiomyopathy, acute on chronic combined systolic and diastolic heart failure, and paroxysmal atrial fibrillation.  Past Medical History    Past Medical History:  Diagnosis Date  . Anemia   . Chronic combined systolic and diastolic CHF (congestive heart failure) ([place])    a. EF 40-45% in 2019 b. EF 25-30% by repeat echo in 01/2019  . CKD (chronic kidney disease), stage IV ([place])   . Coronary artery disease 08/2017   DES x 2 RCA  . GERD (gastroesophageal reflux disease)   . Heart murmur   . Hyperlipidemia   . Hypertension   . Proliferative diabetic retinopathy ([place])    left eye with vitreous hemorrhage and tractional retinal detachment  . Stroke ([place]) 09/2014   numbness left upper lip, finger tips on left hand; "resolved" (05/18/2016)  . Type II diabetes mellitus ([place])   . Wears glasses    Past Surgical History:  Procedure Laterality Date  . CARDIAC CATHETERIZATION N/A 05/18/2016   Procedure: Left Heart Cath and Coronary Angiography;  Surgeon: Jettie Booze, MD;  Location: [place] [facility];  Service: Cardiovascular;  Laterality: N/A;  . CARDIAC CATHETERIZATION N/A 05/18/2016   Procedure: Coronary Stent Intervention;  Surgeon: Jettie Booze, MD;  Location: [place] [facility];  Service: Cardiovascular;  Laterality: N/A;  . COLONOSCOPY W/ BIOPSIES AND POLYPECTOMY    . CORONARY ANGIOPLASTY    . CORONARY ANGIOPLASTY WITH STENT PLACEMENT    . CORONARY STENT INTERVENTION N/A 09/07/2017   Procedure: CORONARY STENT INTERVENTION;  Surgeon: Leonie Man, MD;  Location: [place] [facility];  Service: Cardiovascular;  Laterality: N/A;  . EYE SURGERY    . GAS INSERTION Left 01/13/2018   Procedure:  INSERTION OF GAS;  Surgeon: Bernarda Caffey, MD;  Location: [place];  Service: Ophthalmology;  Laterality: Left;  . LEFT HEART CATH AND CORONARY ANGIOGRAPHY N/A 09/07/2017   Procedure: LEFT HEART CATH AND CORONARY ANGIOGRAPHY;  Surgeon: Leonie Man, MD;  Location: [place] [facility];  Service: Cardiovascular;  Laterality: N/A;  . MEMBRANE PEEL Left 01/13/2018   Procedure: MEMBRANE PEEL LEFT EYE ;  Surgeon: Bernarda Caffey, MD;  Location: [place];  Service: Ophthalmology;  Laterality: Left;  Marland Kitchen MULTIPLE TOOTH EXTRACTIONS    . PARS PLANA VITRECTOMY Left 01/13/2018   Procedure: PARS PLANA VITRECTOMY WITH 25 GAUGE LEFT EYE WITH ENDOLASER;  Surgeon: Bernarda Caffey, MD;  Location: [place];  Service: Ophthalmology;  Laterality: Left;  . TUBAL LIGATION  1980  . VAGINAL HYSTERECTOMY  1999   "partial; fibroids"    Allergies  Allergies  Allergen Reactions  . Ace Inhibitors Cough    History of Present Illness    Ms. Wenzlick was last seen by Dr. Oval Linsey on 04/20/2019.  During that time she was noticing an increase in her episodes of palpitations at night.  She felt like this was related to her atrial fibrillation and her Brilinta use.  It was recommended that she consume caffeine to help with shortness of breath.  Although, drinking Coke did not improve her breathing and she felt like she had more lower extremity edema and general fatigue.  Her symptoms had been present for about 2 weeks and  she was not regularly checking her  weight.  She was taking her Lasix as prescribed but did not notice increased urination unless she was also taking metolazone.  Her Lasix was increased to 120 mg in the a.m. and 80 mg in the afternoon.  She was instructed to take an extra metolazone with a plan to repeat BMP today.  PMH includes coronary artery disease status post LAD PCI in 2017, RCA 2019, PAF, chronic systolic and diastolic heart failure with an LVEF of 25 to 30%, HTN, hyperlipidemia, prior stroke, and CKD 4.  She was a  prior patient of Dr. Bronson Ing but has relocated from [place] to [place] and was admitted to the hospital 01/28/2019 with acute heart failure exacerbation.  Her BNP was elevated at 2666 in the setting of hypertensive urgency due to medication noncompliance.  An echocardiogram from 01/24/2019 showed an LVEF of 25 to 30% down from 40 to 45%.  Due to CKD an ARB/ARN i was not started.  She is also followed by nephrology.  She presents to the clinic today and states she has less swelling in her lower legs.  She is able to lay flat when she goes to sleep and she feels like her breathing is much better today.  She states her daughter helps her with her medications and she has been taking them in the morning and at night.  She also goes on to state that she feels like she urinates more in the afternoon.  She has been followed by neurology and states that she is going to see them again at the end of the month or early October.  She denies chest pain, shortness of breath, lower extremity edema, fatigue, palpitations, melena, hematuria, hemoptysis, diaphoresis, weakness, presyncope, syncope, orthopnea, and PND.   Home Medications    Prior to Admission medications   Medication Sig Start Date End Date Taking? Authorizing Provider  acetaminophen (TYLENOL) 500 MG tablet Take 1,000 mg by mouth every 6 (six) hours as needed for mild pain or headache.    [provider]  atorvastatin (LIPITOR) 40 MG tablet Take 1 tablet (40 mg total) by mouth daily at 6 PM. 04/20/19   Skeet Latch, MD  brimonidine (ALPHAGAN) 0.2 % ophthalmic solution Place 1 drop into the left eye 2 (two) times a day. 11/30/18   Bernarda Caffey, MD  carvedilol (COREG) 25 MG tablet Take 1 tablet (25 mg total) by mouth 2 (two) times daily. 01/30/19   Geradine Girt, DO  dorzolamide-timolol (COSOPT) 22.3-6.8 MG/ML ophthalmic solution Place 1 drop into the left eye 2 (two) times daily.    [provider]  ferrous sulfate  (FERROUSUL) 325 (65 FE) MG tablet Take 1 tablet (325 mg total) by mouth every other day. 11/22/17   Herminio Commons, MD  furosemide (LASIX) 80 MG tablet Take 80 mg by mouth as directed. TAKE 1 AND 1/2 TABLETS IN AM AND 1 IN AFTERNOON    [provider]  glipiZIDE (GLUCOTROL) 10 MG tablet Take 1 tablet (10 mg total) by mouth 2 (two) times daily before a meal. 02/06/19   Azzie Glatter, FNP  Glucose Blood (BLOOD GLUCOSE TEST STRIPS) STRP Use as directed 02/11/17   Hosie Poisson, MD  hydrALAZINE (APRESOLINE) 100 MG tablet Take 1 tablet (100 mg total) by mouth 3 (three) times daily. 01/30/19   Geradine Girt, DO  Insulin Pen Needle 29G X 10MM MISC Use as directed 09/22/17   Scot Jun, FNP  INSULIN SYRINGE .5CC/29G 29G X 1/2" 0.5 ML MISC  Use to administer insulin three times daily 09/22/17   Scot Jun, FNP  isosorbide dinitrate (ISORDIL) 20 MG tablet Take 1 tablet (20 mg total) by mouth 3 (three) times daily. 01/30/19   Geradine Girt, DO  losartan (COZAAR) 100 MG tablet TAKE 1 TABLET BY MOUTH DAILY. 04/03/19   Azzie Glatter, FNP  metFORMIN (GLUCOPHAGE) 500 MG tablet Take 1 tablet (500 mg total) by mouth 2 (two) times daily with a meal. 02/06/19   Azzie Glatter, FNP  metolazone (ZAROXOLYN) 2.5 MG tablet Take 1 tablet (2.5 mg total) by mouth 3 (three) times a week. 02/01/19   Geradine Girt, DO  nitroGLYCERIN (NITROSTAT) 0.4 MG SL tablet Place 1 tablet (0.4 mg total) under the tongue every 5 (five) minutes x 3 doses as needed for chest pain. 07/26/18   Azzie Glatter, FNP  potassium chloride (K-DUR,KLOR-CON) 10 MEQ tablet Take 1 tablet with each dose of furosemide, daily. Patient taking differently: Take 10 mEq by mouth 2 (two) times daily. Take 1 tablet with each dose of furosemide, daily. 10/26/18   Azzie Glatter, FNP  ticagrelor (BRILINTA) 90 MG TABS tablet Take 1 tablet (90 mg total) by mouth 2 (two) times daily. 07/15/17   Tresa Garter, MD  TRUEPLUS PEN  NEEDLES 31G X 6 MM MISC USE AS DIRECTED 06/02/18   Azzie Glatter, FNP  warfarin (COUMADIN) 2.5 MG tablet Take 1 tablet (2.5 mg total) by mouth daily at 6 PM. 01/30/19   Geradine Girt, DO  glimepiride (AMARYL) 1 MG tablet Take 1 tablet (1 mg total) by mouth daily with breakfast. 01/30/19 02/06/19  Geradine Girt, DO    Family History    Family History  Problem Relation Age of Onset  . Diabetes Mother   . Hypertension Mother   . COPD Mother   . Heart failure Mother   . Hypertension Sister   . Hypertension Brother    She indicated that her mother is alive. She indicated that her father is deceased. She indicated that both of her sisters are alive. She indicated that her brother is alive.  Social History    Social History   Socioeconomic History  . Marital status: Married    Spouse name: Not on file  . Number of children: Not on file  . Years of education: 75  . Highest education level: Not on file  Occupational History  . Occupation: hairdresser  Social Needs  . Financial resource strain: Not on file  . Food insecurity    Worry: Not on file    Inability: Not on file  . Transportation needs    Medical: Not on file    Non-medical: Not on file  Tobacco Use  . Smoking status: Never Smoker  . Smokeless tobacco: Never Used  Substance and Sexual Activity  . Alcohol use: No    Alcohol/week: 0.0 standard drinks  . Drug use: No  . Sexual activity: Yes  Lifestyle  . Physical activity    Days per week: Not on file    Minutes per session: Not on file  . Stress: Not on file  Relationships  . Social Herbalist on phone: Not on file    Gets together: Not on file    Attends religious service: Not on file    Active member of club or organization: Not on file    Attends meetings of clubs or organizations: Not on file    Relationship status:  Not on file  . Intimate partner violence    Fear of current or ex partner: Not on file    Emotionally abused: Not on file     Physically abused: Not on file    Forced sexual activity: Not on file  Other Topics Concern  . Not on file  Social History Narrative   Married, 2 children, hairdresser   Caffeine use- occasional tea or coffee   Right handed     Review of Systems    General:  No chills, fever, night sweats or weight changes.  Cardiovascular:  No chest pain, dyspnea on exertion, edema, orthopnea, palpitations, paroxysmal nocturnal dyspnea. Dermatological: No rash, lesions/masses Respiratory: No cough, dyspnea Urologic: No hematuria, dysuria Abdominal:   No nausea, vomiting, diarrhea, bright red blood per rectum, melena, or hematemesis Neurologic:  No visual changes, wkns, changes in mental status. All other systems reviewed and are otherwise negative except as noted above.  Physical Exam    VS:  BP 138/62   Pulse 64   Temp (!) 96.8 F (36 C) (Temporal)   Ht 5' 6.5" (1.689 m)   Wt 170 lb (77.1 kg)   SpO2 97%   BMI 27.03 kg/m  , BMI Body mass index is 27.03 kg/m. GEN: Well nourished, well developed, in no acute distress. HEENT: normal. Neck: Supple, no JVD, carotid bruits, or masses. Cardiac: RRR, no murmurs, rubs, or gallops. No clubbing, cyanosis, bilateral lower extremity nonpitting edema.  Radials/DP/PT 2+ and equal bilaterally.  Respiratory:  Respirations regular and unlabored, clear to auscultation bilaterally. GI: Soft, nontender, nondistended, BS + x 4. MS: no deformity or atrophy. Skin: warm and dry, no rash. Neuro:  Strength and sensation are intact. Psych: Normal affect.  Accessory Clinical Findings    ECG personally reviewed by me today-none today  EKG 01/25/2019 Sinus tachycardia with possible left atrial enlargement septal infarct undetermined age 50 bpm  Echocardiogram 01/24/2019 IMPRESSIONS    1. The left ventricle has severely reduced systolic function, with an ejection fraction of 25-30%. The cavity size was normal. There is mildly increased left ventricular wall  thickness. Left ventricular diastolic Doppler parameters are consistent with  pseudonormalization. Left ventricular diffuse hypokinesis.  2. The right ventricle has mildly reduced systolic function. The cavity was mildly enlarged. Right ventricular systolic pressure is moderately elevated with an estimated pressure of 43.3 mmHg.  3. Left atrial size was moderately dilated.  4. Right atrial size was mildly dilated.  5. The mitral valve is grossly normal. Mild thickening of the mitral valve leaflet. There is mild mitral annular calcification present.  6. The tricuspid valve is grossly normal. Tricuspid valve regurgitation is moderate.  7. The aortic valve is tricuspid. Mild thickening of the aortic valve. No stenosis of the aortic valve.  8. Global hypokinesis with akinesis of the mid septum; overall severe LV dysfunction; mild LVH; moderate diastolic dysfunction; mild MR; biatrial enlargement; mild RVE with mild RV dysfunction; moderate TR; moderate pulmonary hypertension.  Assessment & Plan   1.  Acute on chronic combined systolic and diastolic heart failure- repeat echocardiogram on 01/2019 showed LVEF of 20 to 25% down from 40 to 45%.  Weight down from 172 LB to 170 LB.  Bilateral lower extremity nonpitting edema.  Breathing better today and able to lay flat when sleeping. Decrease Lasix to 80 mg twice daily- okay to take an extra dose of Lasix if there is a weight gain of 3 pounds overnight or 5 pounds in 1 week Continue metolazone  2.5 mg every third day Continue potassium chloride 10 mEq daily Continue carvedilol 25 mg twice daily Continue hydralazine 100 mg 3 times daily Continue losartan 100 mg tablet daily Continue Isordil 20 mg 3 times daily continue losartan 30 mg daily Daily weights Heart healthy low-sodium diet-salty 6 given Continue to monitor blood pressure at home  Coronary artery disease-no chest pain today.  DES x2 an RCA in January 2019 and LAD in 2017 Continue medical  management she did not undergo cardiac catheterization during her last CHF exacerbation due to CKD. Continue carvedilol 25 mg twice daily Continue hydralazine 100 mg 3 times daily Continue losartan 100 mg tablet daily Continue Isordil 20 mg 3 times daily continue losartan 30 mg daily Continue Brilinta 90 mg twice daily Heart healthy low-sodium diet Increase physical activity as tolerated  Paroxysmal atrial fibrillation- 64 HR today Continue carvedilol 25 mg twice daily Continue Coumadin 2.5 mg tablet daily  CKD IV- Lab Results  Component Value Date   CREATININE 3.63 (H) 01/30/2019   CREATININE 3.54 (H) 01/29/2019   CREATININE 3.45 (H) 01/28/2019  Followed by nephrology Repeat BMP today.  Disposition: Follow-up with Dr. Oval Linsey in 3 months  Deberah Pelton, NP-C 04/25/2019, 11:33 AM

## 2019-04-25 NOTE — Patient Instructions (Addendum)
Medication Instructions:   DECREASE Lasix to 80 mg 2 times a day, may take an additional dose of Lasix if weight gain of 3 lbs overnight and 5 lbs in a week  If you need a refill on your cardiac medications before your next appointment, please call your pharmacy.   Lab work: You will need to have labs (blood work) drawn today:  BMET If you have labs (blood work) drawn today and your tests are completely normal, you will receive your results only by: Marland Kitchen MyChart Message (if you have MyChart) OR . A paper copy in the mail If you have any lab test that is abnormal or we need to change your treatment, we will call you to review the results.  Testing/Procedures: NONE ordered at this time of appointment   Follow-Up: At [facility], you and your health needs are our priority.  As part of our continuing mission to provide you with exceptional heart care, we have created designated Provider Care Teams.  These Care Teams include your primary Cardiologist (physician) and Advanced Practice Providers (APPs -  Physician Assistants and Nurse Practitioners) who all work together to provide you with the care you need, when you need it. . You will need a follow up appointment in 3 months with Skeet Latch, MD  Any Other Special Instructions Will Be Listed Below (If Applicable).

## 2019-04-26 ENCOUNTER — Ambulatory Visit: Payer: Medicare Other | Admitting: Physician Assistant

## 2019-04-26 LAB — BASIC METABOLIC PANEL
BUN/Creatinine Ratio: 14 (ref 12–28)
BUN: 71 mg/dL — ABNORMAL HIGH (ref 8–27)
CO2: 24 mmol/L (ref 20–29)
Calcium: 8.5 mg/dL — ABNORMAL LOW (ref 8.7–10.3)
Chloride: 99 mmol/L (ref 96–106)
Creatinine, Ser: 4.95 mg/dL — ABNORMAL HIGH (ref 0.57–1.00)
GFR calc Af Amer: 10 mL/min/{1.73_m2} — ABNORMAL LOW (ref >59)
GFR calc non Af Amer: 9 mL/min/{1.73_m2} — ABNORMAL LOW (ref >59)
Glucose: 255 mg/dL — ABNORMAL HIGH (ref 65–99)
Potassium: 4.1 mmol/L (ref 3.5–5.2)
Sodium: 140 mmol/L (ref 134–144)

## 2019-04-26 MED FILL — WARFARIN SODIUM 5 MG TABLET: 5 | 30 days supply | Qty: 45 | Fill #1

## 2019-04-27 DIAGNOSIS — H2511 Age-related nuclear cataract, right eye: Secondary | ICD-10-CM | POA: Diagnosis not present

## 2019-04-28 MED FILL — KETOROLAC 0.5% OPHTH SOLN: 0.5 | 36 days supply | Qty: 10 | Fill #0

## 2019-04-28 MED FILL — OFLOXACIN 0.3% EYE DROPS: 0.3 | 37 days supply | Qty: 10 | Fill #0

## 2019-04-28 MED FILL — PREDNISOLONE AC 1% EYE DROP: 1 | 37 days supply | Qty: 10 | Fill #0

## 2019-05-04 ENCOUNTER — Telehealth: Payer: Self-pay | Admitting: *Deleted

## 2019-05-04 NOTE — Telephone Encounter (Signed)
Left message to call back  

## 2019-05-04 NOTE — Telephone Encounter (Signed)
-----   Message from Skeet Latch, MD sent at 05/04/2019  2:24 PM EDT ----- Kidney function is getting worse.  Recommend that she check in with her kidney doctor.  How is she doing from a weight and volume standpoint?  She should be seen soon to check in and repeat BMP.

## 2019-05-08 ENCOUNTER — Ambulatory Visit: Payer: Medicare Other | Admitting: Family Medicine

## 2019-05-16 ENCOUNTER — Ambulatory Visit (INDEPENDENT_AMBULATORY_CARE_PROVIDER_SITE_OTHER): Payer: Medicare Other | Admitting: Family Medicine

## 2019-05-16 ENCOUNTER — Encounter: Payer: Self-pay | Admitting: Family Medicine

## 2019-05-16 ENCOUNTER — Other Ambulatory Visit: Payer: Self-pay

## 2019-05-16 VITALS — BP 174/65 | HR 93 | Temp 97.8°F | Ht 66.0 in | Wt 176.6 lb

## 2019-05-16 DIAGNOSIS — Z09 Encounter for follow-up examination after completed treatment for conditions other than malignant neoplasm: Secondary | ICD-10-CM | POA: Diagnosis not present

## 2019-05-16 DIAGNOSIS — E876 Hypokalemia: Secondary | ICD-10-CM | POA: Diagnosis not present

## 2019-05-16 DIAGNOSIS — I10 Essential (primary) hypertension: Secondary | ICD-10-CM

## 2019-05-16 DIAGNOSIS — R7309 Other abnormal glucose: Secondary | ICD-10-CM

## 2019-05-16 DIAGNOSIS — H269 Unspecified cataract: Secondary | ICD-10-CM

## 2019-05-16 DIAGNOSIS — R609 Edema, unspecified: Secondary | ICD-10-CM | POA: Diagnosis not present

## 2019-05-16 DIAGNOSIS — R030 Elevated blood-pressure reading, without diagnosis of hypertension: Secondary | ICD-10-CM | POA: Diagnosis not present

## 2019-05-16 DIAGNOSIS — R600 Localized edema: Secondary | ICD-10-CM

## 2019-05-16 DIAGNOSIS — R42 Dizziness and giddiness: Secondary | ICD-10-CM

## 2019-05-16 DIAGNOSIS — E1159 Type 2 diabetes mellitus with other circulatory complications: Secondary | ICD-10-CM | POA: Diagnosis not present

## 2019-05-16 DIAGNOSIS — H547 Unspecified visual loss: Secondary | ICD-10-CM | POA: Diagnosis not present

## 2019-05-16 LAB — POCT URINALYSIS DIPSTICK
Bilirubin, UA: NEGATIVE
Glucose, UA: POSITIVE — AB
Ketones, UA: NEGATIVE
Leukocytes, UA: NEGATIVE
Nitrite, UA: NEGATIVE
Protein, UA: POSITIVE — AB
Spec Grav, UA: >=1.03 — AB (ref 1.010–1.025)
Urobilinogen, UA: 0.2 E.U./dL
pH, UA: 5.5 (ref 5.0–8.0)

## 2019-05-16 LAB — GLUCOSE, POCT (MANUAL RESULT ENTRY): POC Glucose: 218 mg/dl — AB (ref 70–99)

## 2019-05-16 MED ORDER — HYDRALAZINE HCL 100 MG PO TABS: 100.0000 mg | ORAL_TABLET | Freq: Three times a day (TID) | ORAL | 6 refills | Status: DC

## 2019-05-16 MED ORDER — LOSARTAN POTASSIUM 100 MG PO TABS: 100.0000 mg | ORAL_TABLET | Freq: Every day | ORAL | 6 refills | Status: DC

## 2019-05-16 MED ORDER — GLIMEPIRIDE 1 MG PO TABS: 1.0000 mg | ORAL_TABLET | Freq: Every day | ORAL | 6 refills | Status: DC

## 2019-05-16 MED ORDER — ISOSORBIDE DINITRATE 20 MG PO TABS: 20.0000 mg | ORAL_TABLET | Freq: Three times a day (TID) | ORAL | 6 refills | Status: DC

## 2019-05-16 MED ORDER — METFORMIN HCL 500 MG PO TABS: 500.0000 mg | ORAL_TABLET | Freq: Two times a day (BID) | ORAL | 6 refills | Status: DC

## 2019-05-16 MED ORDER — ATORVASTATIN CALCIUM 40 MG PO TABS: 40.0000 mg | ORAL_TABLET | Freq: Every day | ORAL | 1 refills | Status: DC

## 2019-05-16 MED ORDER — GLIPIZIDE 10 MG PO TABS: 10.0000 mg | ORAL_TABLET | Freq: Two times a day (BID) | ORAL | 6 refills | Status: DC

## 2019-05-16 MED ORDER — METOLAZONE 2.5 MG PO TABS: 2.5000 mg | ORAL_TABLET | ORAL | 6 refills | Status: DC

## 2019-05-16 MED ORDER — NITROGLYCERIN 0.4 MG SL SUBL: 0.4000 mg | SUBLINGUAL_TABLET | SUBLINGUAL | 12 refills | Status: DC | PRN

## 2019-05-16 MED ORDER — FUROSEMIDE 80 MG PO TABS: 80.0000 mg | ORAL_TABLET | Freq: Two times a day (BID) | ORAL | 6 refills | Status: DC

## 2019-05-16 MED ORDER — TICAGRELOR 90 MG PO TABS: 90.0000 mg | ORAL_TABLET | Freq: Two times a day (BID) | ORAL | 1 refills | Status: DC

## 2019-05-16 MED ORDER — POTASSIUM CHLORIDE CRYS ER 10 MEQ PO TBCR: 10.0000 meq | EXTENDED_RELEASE_TABLET | Freq: Two times a day (BID) | ORAL | 6 refills | Status: DC

## 2019-05-16 MED ORDER — FERROUS SULFATE 325 (65 FE) MG PO TABS: 325.0000 mg | ORAL_TABLET | ORAL | 6 refills | Status: DC

## 2019-05-16 MED ORDER — CARVEDILOL 25 MG PO TABS: 25.0000 mg | ORAL_TABLET | Freq: Two times a day (BID) | ORAL | 6 refills | Status: DC

## 2019-05-16 MED FILL — FUROSEMIDE 80 MG TAB: 80 | 90 days supply | Qty: 180 | Fill #0

## 2019-05-16 MED FILL — LOSARTAN POTASSIUM 100 MG T: 100 | 90 days supply | Qty: 90 | Fill #0

## 2019-05-16 MED FILL — POTASSIUM CHLORIDE ER 10 ME: 10 | 90 days supply | Qty: 180 | Fill #0

## 2019-05-16 MED FILL — ISOSORBIDE DN 20 MG TABLET: 20 | 90 days supply | Qty: 270 | Fill #0

## 2019-05-16 MED FILL — glipiZIDE 10 MG TABS: 10 | 90 days supply | Qty: 180 | Fill #0

## 2019-05-16 MED FILL — ATORVASTATIN CALCIUM 40 MG: 40 | 90 days supply | Qty: 90 | Fill #0

## 2019-05-16 MED FILL — NITROGLYCERIN 0.4 MG TAB SL: 0.4 | 20 days supply | Qty: 25 | Fill #0

## 2019-05-16 MED FILL — metOLazone 2.5 MG TABS: 2.5 | 84 days supply | Qty: 36 | Fill #0

## 2019-05-16 MED FILL — CARVEDILOL 25 MG TABLET: 25 | 90 days supply | Qty: 180 | Fill #0

## 2019-05-16 MED FILL — GLIMEPIRIDE 1 MG TABLET: 1 | 90 days supply | Qty: 90 | Fill #0

## 2019-05-16 MED FILL — BRILINTA 90 MG TABLET: 90 | 90 days supply | Qty: 180 | Fill #0

## 2019-05-16 MED FILL — hydrALAZINE HCL 100 MG TABS: 100 | 90 days supply | Qty: 270 | Fill #0

## 2019-05-16 MED FILL — metFORMIN HCL 500 MG TABS: 500 | 90 days supply | Qty: 180 | Fill #0

## 2019-05-16 NOTE — Telephone Encounter (Signed)
Notes recorded by Earvin Hansen, LPN on [id number] at [id number] PM EDT  Left message to call back  Printed lab results, highlighted comment, and to call to discuss further  ------   Notes recorded by Earvin Hansen, LPN on [id number] at 624THL AM EDT  Left message to call back

## 2019-05-16 NOTE — Progress Notes (Signed)
Patient [facility]   Established Patient Office Visit  Subjective:  Patient ID: Amber Young, female    DOB: [birth year]  Age: 63 y.o. MRN: [id number]  CC:  Chief Complaint  Patient presents with  . Follow-up    HPI Amber Young is a 63 year old female who presents for Follow Up today.   Past Medical History:  Diagnosis Date  . Anemia   . Chronic combined systolic and diastolic CHF (congestive heart failure) ([place])    a. EF 40-45% in 2019 b. EF 25-30% by repeat echo in 01/2019  . CKD (chronic kidney disease), stage IV ([place])   . Coronary artery disease 08/2017   DES x 2 RCA  . GERD (gastroesophageal reflux disease)   . Heart murmur   . Hyperlipidemia   . Hypertension   . Proliferative diabetic retinopathy ([place])    left eye with vitreous hemorrhage and tractional retinal detachment  . Stroke ([place]) 09/2014   numbness left upper lip, finger tips on left hand; "resolved" (05/18/2016)  . Type II diabetes mellitus ([place])   . Wears glasses    Current Status: Since her last office visit, she is doing well with no complaints. She is s/p: Cataract surgery 2 weeks ago on 05/01/2019. Her most recent follow up with Nephrology was stable on 04/04/2019. She has c/o dizziness and unsteadiness frequently, which she r/t her blood pressure medications. She has also had heart palpitations lately, which she relates to right after taking Brilinta. She has reported this to Cardiologist at her last appointment. She has not had any headaches, and falls. No chest pain, cough and shortness of breath reported. Her most recent normal range of preprandial blood glucose levels have been between 230-240. Her has seen low range of 46 and immediately drank a soda which brought her range up to 100. She high of 244 since his last office visit. She denies fatigue, frequent urination, blurred vision, excessive hunger, excessive thirst, weight gain, weight loss, and poor wound  healing. She continues to check her feet regularly. She denies fevers, chills, recent infections, weight loss, and night sweats. No reports of GI problems such as diarrhea, and constipation. She has no reports of blood in stools, dysuria and hematuria. No depression or anxiety reported.   Past Surgical History:  Procedure Laterality Date  . CARDIAC CATHETERIZATION N/A 05/18/2016   Procedure: Left Heart Cath and Coronary Angiography;  Surgeon: Jettie Booze, MD;  Location: [facility];  Service: Cardiovascular;  Laterality: N/A;  . CARDIAC CATHETERIZATION N/A 05/18/2016   Procedure: Coronary Stent Intervention;  Surgeon: Jettie Booze, MD;  Location: [facility];  Service: Cardiovascular;  Laterality: N/A;  . COLONOSCOPY W/ BIOPSIES AND POLYPECTOMY    . CORONARY ANGIOPLASTY    . CORONARY ANGIOPLASTY WITH STENT PLACEMENT    . CORONARY STENT INTERVENTION N/A 09/07/2017   Procedure: CORONARY STENT INTERVENTION;  Surgeon: Leonie Man, MD;  Location: [facility];  Service: Cardiovascular;  Laterality: N/A;  . EYE SURGERY    . GAS INSERTION Left 01/13/2018   Procedure: INSERTION OF GAS;  Surgeon: Bernarda Caffey, MD;  Location: [facility];  Service: Ophthalmology;  Laterality: Left;  . LEFT HEART CATH AND CORONARY ANGIOGRAPHY N/A 09/07/2017   Procedure: LEFT HEART CATH AND CORONARY ANGIOGRAPHY;  Surgeon: Leonie Man, MD;  Location: [facility];  Service: Cardiovascular;  Laterality: N/A;  . MEMBRANE PEEL Left 01/13/2018   Procedure:  MEMBRANE PEEL LEFT EYE ;  Surgeon: Bernarda Caffey, MD;  Location: [facility];  Service: Ophthalmology;  Laterality: Left;  Marland Kitchen MULTIPLE TOOTH EXTRACTIONS    . PARS PLANA VITRECTOMY Left 01/13/2018   Procedure: PARS PLANA VITRECTOMY WITH 25 GAUGE LEFT EYE WITH ENDOLASER;  Surgeon: Bernarda Caffey, MD;  Location: [facility];  Service: Ophthalmology;  Laterality: Left;  . TUBAL LIGATION  1980  . VAGINAL HYSTERECTOMY  1999   "partial; fibroids"     Family History  Problem Relation Age of Onset  . Diabetes Mother   . Hypertension Mother   . COPD Mother   . Heart failure Mother   . Hypertension Sister   . Hypertension Brother     Social History   Socioeconomic History  . Marital status: Married    Spouse name: Not on file  . Number of children: Not on file  . Years of education: 31  . Highest education level: Not on file  Occupational History  . Occupation: hairdresser  Social Needs  . Financial resource strain: Not on file  . Food insecurity    Worry: Not on file    Inability: Not on file  . Transportation needs    Medical: Not on file    Non-medical: Not on file  Tobacco Use  . Smoking status: Never Smoker  . Smokeless tobacco: Never Used  Substance and Sexual Activity  . Alcohol use: No    Alcohol/week: 0.0 standard drinks  . Drug use: No  . Sexual activity: Yes  Lifestyle  . Physical activity    Days per week: Not on file    Minutes per session: Not on file  . Stress: Not on file  Relationships  . Social Herbalist on phone: Not on file    Gets together: Not on file    Attends religious service: Not on file    Active member of club or organization: Not on file    Attends meetings of clubs or organizations: Not on file    Relationship status: Not on file  . Intimate partner violence    Fear of current or ex partner: Not on file    Emotionally abused: Not on file    Physically abused: Not on file    Forced sexual activity: Not on file  Other Topics Concern  . Not on file  Social History Narrative   Married, 2 children, hairdresser   Caffeine use- occasional tea or coffee   Right handed    Outpatient Medications Prior to Visit  Medication Sig Dispense Refill  . acetaminophen (TYLENOL) 500 MG tablet Take 1,000 mg by mouth every 6 (six) hours as needed for mild pain or headache.    . TRUEPLUS PEN NEEDLES 31G X 6 MM MISC USE AS DIRECTED 100 each 0  . warfarin (COUMADIN) 2.5 MG tablet Take  1 tablet (2.5 mg total) by mouth daily at 6 PM. 15 tablet 0  . atorvastatin (LIPITOR) 40 MG tablet Take 1 tablet (40 mg total) by mouth daily at 6 PM. 90 tablet 1  . carvedilol (COREG) 25 MG tablet Take 1 tablet (25 mg total) by mouth 2 (two) times daily. 60 tablet 0  . ferrous sulfate (FERROUSUL) 325 (65 FE) MG tablet Take 1 tablet (325 mg total) by mouth every other day. 90 tablet 3  . furosemide (LASIX) 80 MG tablet Take 80 mg by mouth 2 (two) times daily. TAKE 1 tablet 2 times a day by mouth    .  glimepiride (AMARYL) 1 MG tablet Take 1 mg by mouth daily with breakfast.    . glipiZIDE (GLUCOTROL) 10 MG tablet Take 1 tablet (10 mg total) by mouth 2 (two) times daily before a meal. 60 tablet 3  . hydrALAZINE (APRESOLINE) 100 MG tablet Take 1 tablet (100 mg total) by mouth 3 (three) times daily. 90 tablet 0  . isosorbide dinitrate (ISORDIL) 20 MG tablet Take 1 tablet (20 mg total) by mouth 3 (three) times daily. 90 tablet 0  . losartan (COZAAR) 100 MG tablet TAKE 1 TABLET BY MOUTH DAILY. 30 tablet 1  . metFORMIN (GLUCOPHAGE) 500 MG tablet Take 1 tablet (500 mg total) by mouth 2 (two) times daily with a meal. 60 tablet 3  . metolazone (ZAROXOLYN) 2.5 MG tablet Take 1 tablet (2.5 mg total) by mouth 3 (three) times a week. 30 tablet 0  . nitroGLYCERIN (NITROSTAT) 0.4 MG SL tablet Place 1 tablet (0.4 mg total) under the tongue every 5 (five) minutes x 3 doses as needed for chest pain. 30 tablet 12  . potassium chloride (K-DUR,KLOR-CON) 10 MEQ tablet Take 1 tablet with each dose of furosemide, daily. (Patient taking differently: Take 10 mEq by mouth 2 (two) times daily. Take 1 tablet with each dose of furosemide, daily.) 30 tablet 3  . ticagrelor (BRILINTA) 90 MG TABS tablet Take 1 tablet (90 mg total) by mouth 2 (two) times daily. 180 tablet 3   No facility-administered medications prior to visit.     Allergies  Allergen Reactions  . Ace Inhibitors Cough    ROS Review of Systems    Objective:     Physical Exam  BP (!) 174/65   Pulse 93   Temp 97.8 F (36.6 C) (Oral)   Ht 5\' 6"  (1.676 m)   Wt 176 lb 9.6 oz (80.1 kg)   SpO2 100%   BMI 28.50 kg/m  Wt Readings from Last 3 Encounters:  05/16/19 176 lb 9.6 oz (80.1 kg)  04/25/19 170 lb (77.1 kg)  04/20/19 172 lb (78 kg)     Health Maintenance Due  Topic Date Due  . Hepatitis C Screening  27-Apr-1956  . PNEUMOCOCCAL POLYSACCHARIDE VACCINE AGE 42-64 HIGH RISK  04/09/1958  . OPHTHALMOLOGY EXAM  04/09/1966  . PAP SMEAR-Modifier  04/09/1977  . INFLUENZA VACCINE  03/11/2019  . FOOT EXAM  03/31/2019    There are no preventive care reminders to display for this patient.  Lab Results  Component Value Date   TSH 1.730 01/24/2018   Lab Results  Component Value Date   WBC 4.3 01/30/2019   HGB 11.1 (L) 01/30/2019   HCT 35.2 (L) 01/30/2019   MCV 72.4 (L) 01/30/2019   PLT 360 01/30/2019   Lab Results  Component Value Date   NA 140 04/25/2019   K 4.1 04/25/2019   CO2 24 04/25/2019   GLUCOSE 255 (H) 04/25/2019   BUN 71 (H) 04/25/2019   CREATININE 4.95 (H) 04/25/2019   BILITOT 0.4 01/24/2019   ALKPHOS 67 01/24/2019   AST 19 01/24/2019   ALT 14 01/24/2019   PROT 4.9 (L) 01/24/2019   ALBUMIN 2.0 (L) 01/24/2019   CALCIUM 8.5 (L) 04/25/2019   ANIONGAP 13 01/30/2019   Lab Results  Component Value Date   CHOL 217 (H) 01/28/2019   Lab Results  Component Value Date   HDL 75 01/28/2019   Lab Results  Component Value Date   LDLCALC 130 (H) 01/28/2019   Lab Results  Component Value Date  TRIG 59 01/28/2019   Lab Results  Component Value Date   CHOLHDL 2.9 01/28/2019   Lab Results  Component Value Date   HGBA1C 10.9 (H) 01/23/2019      Assessment & Plan:   1. Type 2 diabetes mellitus with other circulatory complication, without long-term current use of insulin (HCC) - POCT glucose (manual entry) - POCT urinalysis dipstick - atorvastatin (LIPITOR) 40 MG tablet; Take 1 tablet (40 mg total) by mouth  daily at 6 PM.  Dispense: 90 tablet; Refill: 1 - glipiZIDE (GLUCOTROL) 10 MG tablet; Take 1 tablet (10 mg total) by mouth 2 (two) times daily before a meal.  Dispense: 60 tablet; Refill: 6 - metFORMIN (GLUCOPHAGE) 500 MG tablet; Take 1 tablet (500 mg total) by mouth 2 (two) times daily with a meal.  Dispense: 60 tablet; Refill: 6  2. Hemoglobin A1c > 9 % indicating poor diabetic control Hgb A1c is at 10.9 today. She will continue medication as prescribed, to decrease foods/beverages high in sugars and carbs and follow Heart Healthy or DASH diet. Increase physical activity to at least 30 minutes cardio exercise daily.   3. Essential hypertension She will continue to take medications as prescribed, to decrease high sodium intake, excessive alcohol intake, increase potassium intake, smoking cessation, and increase physical activity of at least 30 minutes of cardio activity daily. She will continue to follow Heart Healthy or DASH diet. - nitroGLYCERIN (NITROSTAT) 0.4 MG SL tablet; Place 1 tablet (0.4 mg total) under the tongue every 5 (five) minutes x 3 doses as needed for chest pain.  Dispense: 30 tablet; Refill: 12  4. Hypokalemia - potassium chloride (KLOR-CON) 10 MEQ tablet; Take 1 tablet (10 mEq total) by mouth 2 (two) times daily. Take 1 tablet with each dose of furosemide, daily.  Dispense: 60 tablet; Refill: 6  5. Elevated blood pressure reading  6. Dizziness Probable r/t hypertensive medications. She will ambulate and change positions slowly.   7. Edema, peripheral Stable 1+ edema. Not worsening.   8. Visual problems  9. Cataract of both eyes, unspecified cataract type Recent cataract surgery.   10. Follow up She will follow up in 3 months.  Meds ordered this encounter  Medications  . atorvastatin (LIPITOR) 40 MG tablet    Sig: Take 1 tablet (40 mg total) by mouth daily at 6 PM.    Dispense:  90 tablet    Refill:  1  . carvedilol (COREG) 25 MG tablet    Sig: Take 1 tablet  (25 mg total) by mouth 2 (two) times daily.    Dispense:  60 tablet    Refill:  6  . ferrous sulfate (FERROUSUL) 325 (65 FE) MG tablet    Sig: Take 1 tablet (325 mg total) by mouth every other day.    Dispense:  15 tablet    Refill:  6  . furosemide (LASIX) 80 MG tablet    Sig: Take 1 tablet (80 mg total) by mouth 2 (two) times daily. TAKE 1 tablet 2 times a day by mouth    Dispense:  30 tablet    Refill:  6  . glimepiride (AMARYL) 1 MG tablet    Sig: Take 1 tablet (1 mg total) by mouth daily with breakfast.    Dispense:  30 tablet    Refill:  6  . glipiZIDE (GLUCOTROL) 10 MG tablet    Sig: Take 1 tablet (10 mg total) by mouth 2 (two) times daily before a meal.    Dispense:  60 tablet    Refill:  6  . hydrALAZINE (APRESOLINE) 100 MG tablet    Sig: Take 1 tablet (100 mg total) by mouth 3 (three) times daily.    Dispense:  90 tablet    Refill:  6  . isosorbide dinitrate (ISORDIL) 20 MG tablet    Sig: Take 1 tablet (20 mg total) by mouth 3 (three) times daily.    Dispense:  90 tablet    Refill:  6  . losartan (COZAAR) 100 MG tablet    Sig: Take 1 tablet (100 mg total) by mouth daily.    Dispense:  30 tablet    Refill:  6  . metFORMIN (GLUCOPHAGE) 500 MG tablet    Sig: Take 1 tablet (500 mg total) by mouth 2 (two) times daily with a meal.    Dispense:  60 tablet    Refill:  6  . metolazone (ZAROXOLYN) 2.5 MG tablet    Sig: Take 1 tablet (2.5 mg total) by mouth 3 (three) times a week.    Dispense:  30 tablet    Refill:  6  . nitroGLYCERIN (NITROSTAT) 0.4 MG SL tablet    Sig: Place 1 tablet (0.4 mg total) under the tongue every 5 (five) minutes x 3 doses as needed for chest pain.    Dispense:  30 tablet    Refill:  12  . potassium chloride (KLOR-CON) 10 MEQ tablet    Sig: Take 1 tablet (10 mEq total) by mouth 2 (two) times daily. Take 1 tablet with each dose of furosemide, daily.    Dispense:  60 tablet    Refill:  6  . ticagrelor (BRILINTA) 90 MG TABS tablet    Sig: Take 1  tablet (90 mg total) by mouth 2 (two) times daily.    Dispense:  180 tablet    Refill:  1    Orders Placed This Encounter  Procedures  . POCT glucose (manual entry)  . POCT urinalysis dipstick    Referral Orders  No referral(s) requested today    Kathe Becton,  MSN, FNP-BC [address] [place], [place] [address] [phone]   Problem List Items Addressed This Visit      Cardiovascular and Mediastinum   Essential hypertension   Relevant Medications   atorvastatin (LIPITOR) 40 MG tablet   carvedilol (COREG) 25 MG tablet   furosemide (LASIX) 80 MG tablet   hydrALAZINE (APRESOLINE) 100 MG tablet   isosorbide dinitrate (ISORDIL) 20 MG tablet   losartan (COZAAR) 100 MG tablet   metolazone (ZAROXOLYN) 2.5 MG tablet   nitroGLYCERIN (NITROSTAT) 0.4 MG SL tablet     Other   Cataract of both eyes   Dizziness   Edema, peripheral   Hypokalemia   Relevant Medications   potassium chloride (KLOR-CON) 10 MEQ tablet   Visual problems    Other Visit Diagnoses    Type 2 diabetes mellitus with other circulatory complication, without long-term current use of insulin (HCC)    -  Primary   Relevant Medications   atorvastatin (LIPITOR) 40 MG tablet   glimepiride (AMARYL) 1 MG tablet   glipiZIDE (GLUCOTROL) 10 MG tablet   losartan (COZAAR) 100 MG tablet   metFORMIN (GLUCOPHAGE) 500 MG tablet   Other Relevant Orders   POCT glucose (manual entry) (Completed)   POCT urinalysis dipstick (Completed)   Elevated blood pressure reading       Follow up  Meds ordered this encounter  Medications  . atorvastatin (LIPITOR) 40 MG tablet    Sig: Take 1 tablet (40 mg total) by mouth daily at 6 PM.    Dispense:  90 tablet    Refill:  1  . carvedilol (COREG) 25 MG tablet    Sig: Take 1 tablet (25 mg total) by mouth 2 (two) times daily.    Dispense:  60 tablet    Refill:  6  .  ferrous sulfate (FERROUSUL) 325 (65 FE) MG tablet    Sig: Take 1 tablet (325 mg total) by mouth every other day.    Dispense:  15 tablet    Refill:  6  . furosemide (LASIX) 80 MG tablet    Sig: Take 1 tablet (80 mg total) by mouth 2 (two) times daily. TAKE 1 tablet 2 times a day by mouth    Dispense:  30 tablet    Refill:  6  . glimepiride (AMARYL) 1 MG tablet    Sig: Take 1 tablet (1 mg total) by mouth daily with breakfast.    Dispense:  30 tablet    Refill:  6  . glipiZIDE (GLUCOTROL) 10 MG tablet    Sig: Take 1 tablet (10 mg total) by mouth 2 (two) times daily before a meal.    Dispense:  60 tablet    Refill:  6  . hydrALAZINE (APRESOLINE) 100 MG tablet    Sig: Take 1 tablet (100 mg total) by mouth 3 (three) times daily.    Dispense:  90 tablet    Refill:  6  . isosorbide dinitrate (ISORDIL) 20 MG tablet    Sig: Take 1 tablet (20 mg total) by mouth 3 (three) times daily.    Dispense:  90 tablet    Refill:  6  . losartan (COZAAR) 100 MG tablet    Sig: Take 1 tablet (100 mg total) by mouth daily.    Dispense:  30 tablet    Refill:  6  . metFORMIN (GLUCOPHAGE) 500 MG tablet    Sig: Take 1 tablet (500 mg total) by mouth 2 (two) times daily with a meal.    Dispense:  60 tablet    Refill:  6  . metolazone (ZAROXOLYN) 2.5 MG tablet    Sig: Take 1 tablet (2.5 mg total) by mouth 3 (three) times a week.    Dispense:  30 tablet    Refill:  6  . nitroGLYCERIN (NITROSTAT) 0.4 MG SL tablet    Sig: Place 1 tablet (0.4 mg total) under the tongue every 5 (five) minutes x 3 doses as needed for chest pain.    Dispense:  30 tablet    Refill:  12  . potassium chloride (KLOR-CON) 10 MEQ tablet    Sig: Take 1 tablet (10 mEq total) by mouth 2 (two) times daily. Take 1 tablet with each dose of furosemide, daily.    Dispense:  60 tablet    Refill:  6  . ticagrelor (BRILINTA) 90 MG TABS tablet    Sig: Take 1 tablet (90 mg total) by mouth 2 (two) times daily.    Dispense:  180 tablet    Refill:   1    Follow-up: Return in about 3 months (around 08/16/2019).    Azzie Glatter, FNP

## 2019-05-26 ENCOUNTER — Ambulatory Visit (INDEPENDENT_AMBULATORY_CARE_PROVIDER_SITE_OTHER): Payer: Medicare Other | Admitting: Pharmacist

## 2019-05-26 ENCOUNTER — Other Ambulatory Visit: Payer: Self-pay

## 2019-05-26 DIAGNOSIS — I480 Paroxysmal atrial fibrillation: Secondary | ICD-10-CM | POA: Diagnosis not present

## 2019-05-26 DIAGNOSIS — Z5181 Encounter for therapeutic drug level monitoring: Secondary | ICD-10-CM

## 2019-05-26 LAB — POCT INR: INR: 1.1 — AB (ref 2.0–3.0)

## 2019-05-26 MED ORDER — WARFARIN SODIUM 5 MG PO TABS: ORAL_TABLET | ORAL | 0 refills | Status: DC

## 2019-05-26 MED FILL — WARFARIN SODIUM 5 MG TABLET: 5 | 16 days supply | Qty: 45 | Fill #0

## 2019-05-26 NOTE — Patient Instructions (Signed)
*  Take 10mg  today and tomorrow, then resume taking 5mg  daily except for 7.5mg  every Monday, Wednesday and Friday. Repeat INR in 1 week. Bring warfarin tablet to appointment

## 2019-06-02 DIAGNOSIS — D631 Anemia in chronic kidney disease: Secondary | ICD-10-CM | POA: Diagnosis not present

## 2019-06-02 DIAGNOSIS — N184 Chronic kidney disease, stage 4 (severe): Secondary | ICD-10-CM | POA: Diagnosis not present

## 2019-06-02 DIAGNOSIS — I129 Hypertensive chronic kidney disease with stage 1 through stage 4 chronic kidney disease, or unspecified chronic kidney disease: Secondary | ICD-10-CM | POA: Diagnosis not present

## 2019-06-02 DIAGNOSIS — N2581 Secondary hyperparathyroidism of renal origin: Secondary | ICD-10-CM | POA: Diagnosis not present

## 2019-06-09 ENCOUNTER — Ambulatory Visit (INDEPENDENT_AMBULATORY_CARE_PROVIDER_SITE_OTHER): Payer: Medicare Other | Admitting: Pharmacist Clinician (PhC)/ Clinical Pharmacy Specialist

## 2019-06-09 ENCOUNTER — Other Ambulatory Visit: Payer: Self-pay

## 2019-06-09 DIAGNOSIS — Z5181 Encounter for therapeutic drug level monitoring: Secondary | ICD-10-CM

## 2019-06-09 DIAGNOSIS — I480 Paroxysmal atrial fibrillation: Secondary | ICD-10-CM

## 2019-06-09 LAB — POCT INR: INR: 1.3 — AB (ref 2.0–3.0)

## 2019-06-15 ENCOUNTER — Other Ambulatory Visit: Payer: Self-pay

## 2019-06-15 ENCOUNTER — Emergency Department (HOSPITAL_COMMUNITY): Payer: Medicare Other

## 2019-06-15 ENCOUNTER — Encounter (HOSPITAL_COMMUNITY): Payer: Self-pay | Admitting: Emergency Medicine

## 2019-06-15 ENCOUNTER — Inpatient Hospital Stay (HOSPITAL_COMMUNITY)
Admission: EM | Admit: 2019-06-15 | Discharge: 2019-06-18 | DRG: 637 | Disposition: A | Discharge status: Home Health | Payer: Medicare Other | Attending: Internal Medicine | Admitting: Internal Medicine

## 2019-06-15 DIAGNOSIS — I160 Hypertensive urgency: Secondary | ICD-10-CM | POA: Diagnosis present

## 2019-06-15 DIAGNOSIS — G92 Toxic encephalopathy: Secondary | ICD-10-CM | POA: Diagnosis present

## 2019-06-15 DIAGNOSIS — Z888 Allergy status to other drugs, medicaments and biological substances status: Secondary | ICD-10-CM

## 2019-06-15 DIAGNOSIS — I2510 Atherosclerotic heart disease of native coronary artery without angina pectoris: Secondary | ICD-10-CM | POA: Diagnosis present

## 2019-06-15 DIAGNOSIS — Z7902 Long term (current) use of antithrombotics/antiplatelets: Secondary | ICD-10-CM

## 2019-06-15 DIAGNOSIS — G9341 Metabolic encephalopathy: Secondary | ICD-10-CM

## 2019-06-15 DIAGNOSIS — N184 Chronic kidney disease, stage 4 (severe): Secondary | ICD-10-CM | POA: Diagnosis present

## 2019-06-15 DIAGNOSIS — Z833 Family history of diabetes mellitus: Secondary | ICD-10-CM

## 2019-06-15 DIAGNOSIS — E785 Hyperlipidemia, unspecified: Secondary | ICD-10-CM | POA: Diagnosis present

## 2019-06-15 DIAGNOSIS — N179 Acute kidney failure, unspecified: Secondary | ICD-10-CM | POA: Diagnosis not present

## 2019-06-15 DIAGNOSIS — Z825 Family history of asthma and other chronic lower respiratory diseases: Secondary | ICD-10-CM

## 2019-06-15 DIAGNOSIS — E1122 Type 2 diabetes mellitus with diabetic chronic kidney disease: Secondary | ICD-10-CM | POA: Diagnosis present

## 2019-06-15 DIAGNOSIS — I248 Other forms of acute ischemic heart disease: Secondary | ICD-10-CM | POA: Diagnosis present

## 2019-06-15 DIAGNOSIS — Z91138 Patient's unintentional underdosing of medication regimen for other reason: Secondary | ICD-10-CM

## 2019-06-15 DIAGNOSIS — Z90710 Acquired absence of both cervix and uterus: Secondary | ICD-10-CM

## 2019-06-15 DIAGNOSIS — E872 Acidosis: Secondary | ICD-10-CM | POA: Diagnosis present

## 2019-06-15 DIAGNOSIS — T465X6A Underdosing of other antihypertensive drugs, initial encounter: Secondary | ICD-10-CM | POA: Diagnosis present

## 2019-06-15 DIAGNOSIS — E876 Hypokalemia: Secondary | ICD-10-CM | POA: Diagnosis present

## 2019-06-15 DIAGNOSIS — E860 Dehydration: Secondary | ICD-10-CM | POA: Diagnosis present

## 2019-06-15 DIAGNOSIS — I5042 Chronic combined systolic (congestive) and diastolic (congestive) heart failure: Secondary | ICD-10-CM | POA: Diagnosis not present

## 2019-06-15 DIAGNOSIS — T383X6A Underdosing of insulin and oral hypoglycemic [antidiabetic] drugs, initial encounter: Secondary | ICD-10-CM | POA: Diagnosis present

## 2019-06-15 DIAGNOSIS — D649 Anemia, unspecified: Secondary | ICD-10-CM | POA: Diagnosis present

## 2019-06-15 DIAGNOSIS — G9340 Encephalopathy, unspecified: Secondary | ICD-10-CM | POA: Diagnosis present

## 2019-06-15 DIAGNOSIS — Z7901 Long term (current) use of anticoagulants: Secondary | ICD-10-CM

## 2019-06-15 DIAGNOSIS — Z79899 Other long term (current) drug therapy: Secondary | ICD-10-CM

## 2019-06-15 DIAGNOSIS — I480 Paroxysmal atrial fibrillation: Secondary | ICD-10-CM | POA: Diagnosis present

## 2019-06-15 DIAGNOSIS — K828 Other specified diseases of gallbladder: Secondary | ICD-10-CM | POA: Diagnosis present

## 2019-06-15 DIAGNOSIS — Z955 Presence of coronary angioplasty implant and graft: Secondary | ICD-10-CM

## 2019-06-15 DIAGNOSIS — Z20828 Contact with and (suspected) exposure to other viral communicable diseases: Secondary | ICD-10-CM | POA: Diagnosis not present

## 2019-06-15 DIAGNOSIS — E1110 Type 2 diabetes mellitus with ketoacidosis without coma: Secondary | ICD-10-CM | POA: Diagnosis not present

## 2019-06-15 DIAGNOSIS — I130 Hypertensive heart and chronic kidney disease with heart failure and stage 1 through stage 4 chronic kidney disease, or unspecified chronic kidney disease: Secondary | ICD-10-CM | POA: Diagnosis present

## 2019-06-15 DIAGNOSIS — R531 Weakness: Secondary | ICD-10-CM | POA: Diagnosis not present

## 2019-06-15 DIAGNOSIS — Z8673 Personal history of transient ischemic attack (TIA), and cerebral infarction without residual deficits: Secondary | ICD-10-CM

## 2019-06-15 DIAGNOSIS — Z8249 Family history of ischemic heart disease and other diseases of the circulatory system: Secondary | ICD-10-CM

## 2019-06-15 DIAGNOSIS — K219 Gastro-esophageal reflux disease without esophagitis: Secondary | ICD-10-CM | POA: Diagnosis present

## 2019-06-15 DIAGNOSIS — Z7984 Long term (current) use of oral hypoglycemic drugs: Secondary | ICD-10-CM

## 2019-06-15 LAB — PROTIME-INR
INR: 1.3 — ABNORMAL HIGH (ref 0.8–1.2)
Prothrombin Time: 16.4 seconds — ABNORMAL HIGH (ref 11.4–15.2)

## 2019-06-15 LAB — CBC
HCT: 32.6 % — ABNORMAL LOW (ref 36.0–46.0)
Hemoglobin: 10 g/dL — ABNORMAL LOW (ref 12.0–15.0)
MCH: 24.8 pg — ABNORMAL LOW (ref 26.0–34.0)
MCHC: 30.7 g/dL (ref 30.0–36.0)
MCV: 80.7 fL (ref 80.0–100.0)
Platelets: 306 10*3/uL (ref 150–400)
RBC: 4.04 MIL/uL (ref 3.87–5.11)
RDW: 16.4 % — ABNORMAL HIGH (ref 11.5–15.5)
WBC: 6.2 10*3/uL (ref 4.0–10.5)
nRBC: 0.5 % — ABNORMAL HIGH (ref 0.0–0.2)

## 2019-06-15 LAB — TROPONIN I (HIGH SENSITIVITY): Troponin I (High Sensitivity): 70 ng/L — ABNORMAL HIGH (ref <18)

## 2019-06-15 LAB — BASIC METABOLIC PANEL
Anion gap: 19 — ABNORMAL HIGH (ref 5–15)
BUN: 50 mg/dL — ABNORMAL HIGH (ref 8–23)
CO2: 15 mmol/L — ABNORMAL LOW (ref 22–32)
Calcium: 9 mg/dL (ref 8.9–10.3)
Chloride: 102 mmol/L (ref 98–111)
Creatinine, Ser: 4.63 mg/dL — ABNORMAL HIGH (ref 0.44–1.00)
GFR calc Af Amer: 11 mL/min — ABNORMAL LOW (ref >60)
GFR calc non Af Amer: 9 mL/min — ABNORMAL LOW (ref >60)
Glucose, Bld: 441 mg/dL — ABNORMAL HIGH (ref 70–99)
Potassium: 3.7 mmol/L (ref 3.5–5.1)
Sodium: 136 mmol/L (ref 135–145)

## 2019-06-15 LAB — CBG MONITORING, ED: Glucose-Capillary: 412 mg/dL — ABNORMAL HIGH (ref 70–99)

## 2019-06-15 MED ORDER — SODIUM CHLORIDE 0.9 % IV SOLN
INTRAVENOUS | Status: DC
  Administered 2019-06-16: 01:00:00 via INTRAVENOUS

## 2019-06-15 MED ORDER — CARVEDILOL 12.5 MG PO TABS
25.0000 mg | ORAL_TABLET | Freq: Once | ORAL | Status: AC
  Administered 2019-06-15: 25 mg via ORAL
  Filled 2019-06-15: qty 2

## 2019-06-15 MED ORDER — SODIUM CHLORIDE 0.9 % IV BOLUS: 1000.0000 mL | Freq: Once | INTRAVENOUS | Status: DC

## 2019-06-15 MED ORDER — HYDRALAZINE HCL 25 MG PO TABS
100.0000 mg | ORAL_TABLET | Freq: Once | ORAL | Status: AC
  Administered 2019-06-15: 100 mg via ORAL
  Filled 2019-06-15: qty 4

## 2019-06-15 MED ORDER — SODIUM CHLORIDE 0.9 % IV BOLUS
1000.0000 mL | Freq: Once | INTRAVENOUS | Status: AC
  Administered 2019-06-15: 1000 mL via INTRAVENOUS

## 2019-06-15 MED ORDER — SODIUM CHLORIDE 0.9% FLUSH: 3.0000 mL | Freq: Once | INTRAVENOUS | Status: DC

## 2019-06-15 MED ORDER — INSULIN REGULAR(HUMAN) IN NACL 100-0.9 UT/100ML-% IV SOLN
INTRAVENOUS | Status: DC
  Administered 2019-06-16: 3.2 [IU]/h via INTRAVENOUS
  Filled 2019-06-15: qty 100

## 2019-06-15 MED ORDER — DEXTROSE-NACL 5-0.45 % IV SOLN: INTRAVENOUS | Status: DC

## 2019-06-15 NOTE — ED Provider Notes (Signed)
[facility] EMERGENCY DEPARTMENT Provider Note   CSN: [id number] Arrival date & time: 06/15/19  1737     History   Chief Complaint Chief Complaint  Patient presents with  . Weakness    HPI Amber Young is a 63 y.o. female.     The history is provided by the patient and medical records.     63 year old female with history of anemia, CHF, chronic kidney disease, coronary artery disease status post DES x2, GERD, hyperlipidemia, hypertension, prior stroke, DM 2, presenting to the ED with generalized weakness.  Daughter reports on Monday patient seemed okay but this was the last full day she took all of her medications.  States on Tuesday she complained of being very tired and not really wanting to do anything.  She has noticed her sleeping more during the day as well as sleeping at night.  She has been refusing to take her home medications or eat, but they have been able to get her to drink small amounts of juice, mashed potatoes, etc.  She has not had any cough, fever, or complained of any pain such as chest pain, shortness of breath, headache.  She has not seemed confused per daughter.  Past Medical History:  Diagnosis Date  . Anemia   . Chronic combined systolic and diastolic CHF (congestive heart failure) ([place])    a. EF 40-45% in 2019 b. EF 25-30% by repeat echo in 01/2019  . CKD (chronic kidney disease), stage IV ([place])   . Coronary artery disease 08/2017   DES x 2 RCA  . GERD (gastroesophageal reflux disease)   . Heart murmur   . Hyperlipidemia   . Hypertension   . Proliferative diabetic retinopathy ([place])    left eye with vitreous hemorrhage and tractional retinal detachment  . Stroke ([place]) 09/2014   numbness left upper lip, finger tips on left hand; "resolved" (05/18/2016)  . Type II diabetes mellitus ([place])   . Wears glasses     Patient Active Problem List   Diagnosis Date Noted  . Hemoglobin A1C greater than 9%, indicating poor diabetic control  02/06/2019  . Glaucoma of left eye 01/23/2019  . Diabetic retinopathy of both eyes with macular edema associated with type 2 diabetes mellitus ([place]) 01/23/2019  . Cataract of both eyes 01/23/2019  . Intractable headache 01/23/2019  . Dizziness 01/23/2019  . Acute on chronic heart failure ([place]) 01/23/2019  . Type 2 diabetes mellitus without complication, with long-term current use of insulin ([place]) 12/01/2018  . Left breast mass 12/01/2018  . Edema, peripheral 12/01/2018  . Visual problems 12/01/2018  . Recent weight loss 12/01/2018  . Paroxysmal atrial fibrillation with RVR ([place]) 07/20/2018  . CKD (chronic kidney disease), stage IV ([place]) 09/07/2017  . Hypertensive urgency 09/07/2017  . Essential hypertension   . Unstable angina ([place])   . Coronary artery disease involving native coronary artery of native heart with unstable angina pectoris ([place])   . Chronic anticoagulation 01/06/2017  . Breast abscess 12/20/2016  . Encounter for therapeutic drug monitoring 05/29/2016  . CAD S/P PCI mLAD DES (Promus 3.0 x 24 -- 3.25 mm) 05/19/2016  . PAF (paroxysmal atrial fibrillation) ([place]) 05/19/2016  . Cardiomyopathy, ischemic     Class: Diagnosis of  . Microcytic anemia 04/26/2016  . Acute on chronic combined systolic (congestive) and diastolic (congestive) heart failure ([place]) 11/14/2015  . Hypokalemia 11/14/2015  . Hypertensive emergency   . Uncontrolled hypertension 09/30/2014  . Uncontrolled diabetes mellitus ([place]) 09/30/2014  .  History of stroke 09/30/2014    Past Surgical History:  Procedure Laterality Date  . CARDIAC CATHETERIZATION N/A 05/18/2016   Procedure: Left Heart Cath and Coronary Angiography;  Surgeon: Jettie Booze, MD;  Location: [facility];  Service: Cardiovascular;  Laterality: N/A;  . CARDIAC CATHETERIZATION N/A 05/18/2016   Procedure: Coronary Stent Intervention;  Surgeon: Jettie Booze, MD;  Location: [facility];  Service: Cardiovascular;   Laterality: N/A;  . COLONOSCOPY W/ BIOPSIES AND POLYPECTOMY    . CORONARY ANGIOPLASTY    . CORONARY ANGIOPLASTY WITH STENT PLACEMENT    . CORONARY STENT INTERVENTION N/A 09/07/2017   Procedure: CORONARY STENT INTERVENTION;  Surgeon: Leonie Man, MD;  Location: [facility];  Service: Cardiovascular;  Laterality: N/A;  . EYE SURGERY    . GAS INSERTION Left 01/13/2018   Procedure: INSERTION OF GAS;  Surgeon: Bernarda Caffey, MD;  Location: [facility];  Service: Ophthalmology;  Laterality: Left;  . LEFT HEART CATH AND CORONARY ANGIOGRAPHY N/A 09/07/2017   Procedure: LEFT HEART CATH AND CORONARY ANGIOGRAPHY;  Surgeon: Leonie Man, MD;  Location: [facility];  Service: Cardiovascular;  Laterality: N/A;  . MEMBRANE PEEL Left 01/13/2018   Procedure: MEMBRANE PEEL LEFT EYE ;  Surgeon: Bernarda Caffey, MD;  Location: [facility];  Service: Ophthalmology;  Laterality: Left;  Marland Kitchen MULTIPLE TOOTH EXTRACTIONS    . PARS PLANA VITRECTOMY Left 01/13/2018   Procedure: PARS PLANA VITRECTOMY WITH 25 GAUGE LEFT EYE WITH ENDOLASER;  Surgeon: Bernarda Caffey, MD;  Location: [facility];  Service: Ophthalmology;  Laterality: Left;  . TUBAL LIGATION  1980  . VAGINAL HYSTERECTOMY  1999   "partial; fibroids"     OB History    Gravida  2   Para      Term      Preterm      AB      Living  2     SAB      TAB      Ectopic      Multiple      Live Births               Home Medications    Prior to Admission medications   Medication Sig Start Date End Date Taking? Authorizing Provider  acetaminophen (TYLENOL) 500 MG tablet Take 1,000 mg by mouth every 6 (six) hours as needed for mild pain or headache.    [provider]  atorvastatin (LIPITOR) 40 MG tablet Take 1 tablet (40 mg total) by mouth daily at 6 PM. 05/16/19   Azzie Glatter, FNP  carvedilol (COREG) 25 MG tablet Take 1 tablet (25 mg total) by mouth 2 (two) times daily. 05/16/19   Azzie Glatter, FNP  ferrous sulfate (FERROUSUL) 325  (65 FE) MG tablet Take 1 tablet (325 mg total) by mouth every other day. 05/16/19   Azzie Glatter, FNP  furosemide (LASIX) 80 MG tablet Take 1 tablet (80 mg total) by mouth 2 (two) times daily. TAKE 1 tablet 2 times a day by mouth 05/16/19   Azzie Glatter, FNP  glimepiride (AMARYL) 1 MG tablet Take 1 tablet (1 mg total) by mouth daily with breakfast. 05/16/19   Azzie Glatter, FNP  glipiZIDE (GLUCOTROL) 10 MG tablet Take 1 tablet (10 mg total) by mouth 2 (two) times daily before a meal. 05/16/19   Azzie Glatter, FNP  hydrALAZINE (APRESOLINE) 100 MG tablet Take 1 tablet (100 mg total) by  mouth 3 (three) times daily. 05/16/19   Azzie Glatter, FNP  isosorbide dinitrate (ISORDIL) 20 MG tablet Take 1 tablet (20 mg total) by mouth 3 (three) times daily. 05/16/19   Azzie Glatter, FNP  losartan (COZAAR) 100 MG tablet Take 1 tablet (100 mg total) by mouth daily. 05/16/19   Azzie Glatter, FNP  metFORMIN (GLUCOPHAGE) 500 MG tablet Take 1 tablet (500 mg total) by mouth 2 (two) times daily with a meal. 05/16/19   Azzie Glatter, FNP  metolazone (ZAROXOLYN) 2.5 MG tablet Take 1 tablet (2.5 mg total) by mouth 3 (three) times a week. 05/17/19   Azzie Glatter, FNP  nitroGLYCERIN (NITROSTAT) 0.4 MG SL tablet Place 1 tablet (0.4 mg total) under the tongue every 5 (five) minutes x 3 doses as needed for chest pain. 05/16/19   Azzie Glatter, FNP  potassium chloride (KLOR-CON) 10 MEQ tablet Take 1 tablet (10 mEq total) by mouth 2 (two) times daily. Take 1 tablet with each dose of furosemide, daily. 05/16/19   Azzie Glatter, FNP  ticagrelor (BRILINTA) 90 MG TABS tablet Take 1 tablet (90 mg total) by mouth 2 (two) times daily. 05/16/19   Azzie Glatter, FNP  TRUEPLUS PEN NEEDLES 31G X 6 MM MISC USE AS DIRECTED 06/02/18   Azzie Glatter, FNP  warfarin (COUMADIN) 5 MG tablet Take 5mg  daily except for 7.5mg  every Monday, Wednesday and Friday 05/26/19   Herminio Commons, MD    Family  History Family History  Problem Relation Age of Onset  . Diabetes Mother   . Hypertension Mother   . COPD Mother   . Heart failure Mother   . Hypertension Sister   . Hypertension Brother     Social History Social History   Tobacco Use  . Smoking status: Never Smoker  . Smokeless tobacco: Never Used  Substance Use Topics  . Alcohol use: No    Alcohol/week: 0.0 standard drinks  . Drug use: No     Allergies   Ace inhibitors   Review of Systems Review of Systems  Neurological: Positive for weakness.  All other systems reviewed and are negative.    Physical Exam Updated Vital Signs BP (!) 209/129   Pulse (!) 108   Temp 97.8 F (36.6 C) (Oral)   Resp (!) 21   SpO2 100%   Physical Exam Vitals signs and nursing note reviewed.  Constitutional:      Appearance: She is well-developed.     Comments: Appears fatigued, weak  HENT:     Head: Normocephalic and atraumatic.  Eyes:     Conjunctiva/sclera: Conjunctivae normal.     Pupils: Pupils are equal, round, and reactive to light.  Neck:     Musculoskeletal: Normal range of motion.  Cardiovascular:     Rate and Rhythm: Regular rhythm. Tachycardia present.     Heart sounds: Normal heart sounds.     Comments: Tachy around 103 during exam Pulmonary:     Effort: Pulmonary effort is normal. Tachypnea present. No respiratory distress.     Breath sounds: Normal breath sounds. No stridor.     Comments: tachypneic but lungs clear Abdominal:     General: Bowel sounds are normal.     Palpations: Abdomen is soft.  Musculoskeletal: Normal range of motion.  Skin:    General: Skin is warm and dry.  Neurological:     Mental Status: She is alert and oriented to person, place, and time.  Comments: Awake, alert, oriented x3, generalized weakness without focal deficit      ED Treatments / Results  Labs (all labs ordered are listed, but only abnormal results are displayed) Labs Reviewed  BASIC METABOLIC PANEL -  Abnormal; Notable for the following components:      Result Value   CO2 15 (*)    Glucose, Bld 441 (*)    BUN 50 (*)    Creatinine, Ser 4.63 (*)    GFR calc non Af Amer 9 (*)    GFR calc Af Amer 11 (*)    Anion gap 19 (*)    All other components within normal limits  CBC - Abnormal; Notable for the following components:   Hemoglobin 10.0 (*)    HCT 32.6 (*)    MCH 24.8 (*)    RDW 16.4 (*)    nRBC 0.5 (*)    All other components within normal limits  PROTIME-INR - Abnormal; Notable for the following components:   Prothrombin Time 16.4 (*)    INR 1.3 (*)    All other components within normal limits  CBG MONITORING, ED - Abnormal; Notable for the following components:   Glucose-Capillary 412 (*)    All other components within normal limits  CBG MONITORING, ED - Abnormal; Notable for the following components:   Glucose-Capillary 383 (*)    All other components within normal limits  POCT I-STAT EG7 - Abnormal; Notable for the following components:   pCO2, Ven 36.5 (*)    Bicarbonate 18.2 (*)    TCO2 19 (*)    Acid-base deficit 7.0 (*)    Calcium, Ion 1.09 (*)    HCT 32.0 (*)    Hemoglobin 10.9 (*)    All other components within normal limits  TROPONIN I (HIGH SENSITIVITY) - Abnormal; Notable for the following components:   Troponin I (High Sensitivity) 70 (*)    All other components within normal limits  SARS CORONAVIRUS 2 (TAT 6-24 HRS)  URINALYSIS, ROUTINE W REFLEX MICROSCOPIC  I-STAT VENOUS BLOOD GAS, ED  TROPONIN I (HIGH SENSITIVITY)    EKG EKG Interpretation  Date/Time:  Thursday June 15 2019 17:51:52 EST Ventricular Rate:  102 PR Interval:  164 QRS Duration: 82 QT Interval:  380 QTC Calculation: 495 R Axis:   57 Text Interpretation: Sinus tachycardia Nonspecific ST and T wave abnormality Abnormal ECG No significant change since last tracing Confirmed by Dorie Rank [phone]) on 06/15/2019 10:29:21 PM   Radiology Dg Chest 2 View  Result Date: 06/15/2019  CLINICAL DATA:  Weakness, loss of appetite EXAM: CHEST - 2 VIEW COMPARISON:  01/23/2019 FINDINGS: Cardiomegaly. No confluent opacities, effusions or edema. No acute bony abnormality. Old healed proximal left humeral fracture. IMPRESSION: Cardiomegaly.  No active disease. Electronically Signed   By: Rolm Baptise M.D.   On: 06/15/2019 19:10    Procedures Procedures (including critical care time)  CRITICAL CARE Performed by: Larene Pickett   Total critical care time: 45 minutes  Critical care time was exclusive of separately billable procedures and treating other patients.  Critical care was necessary to treat or prevent imminent or life-threatening deterioration.  Critical care was time spent personally by me on the following activities: development of treatment plan with patient and/or surrogate as well as nursing, discussions with consultants, evaluation of patient's response to treatment, examination of patient, obtaining history from patient or surrogate, ordering and performing treatments and interventions, ordering and review of laboratory studies, ordering and review of radiographic studies,  pulse oximetry and re-evaluation of patient's condition.   Medications Ordered in ED Medications  sodium chloride flush (NS) 0.9 % injection 3 mL (has no administration in time range)  dextrose 5 %-0.45 % sodium chloride infusion (has no administration in time range)  insulin regular, human (MYXREDLIN) 100 units/ 100 mL infusion (3.2 Units/hr Intravenous New Bag/Given 06/16/19 0004)  sodium chloride 0.9 % bolus 1,000 mL (1,000 mLs Intravenous New Bag/Given 06/15/19 2349)    And  0.9 %  sodium chloride infusion (has no administration in time range)  carvedilol (COREG) tablet 25 mg (25 mg Oral Given 06/15/19 2328)  hydrALAZINE (APRESOLINE) tablet 100 mg (100 mg Oral Given 06/15/19 2328)     Initial Impression / Assessment and Plan / ED Course  I have reviewed the triage vital signs and the  nursing notes.  Pertinent labs & imaging results that were available during my care of the patient were reviewed by me and considered in my medical decision making (see chart for details).  63 year old female here with generalized weakness.  Has not taken any of her home medications since Monday.  Has been sleeping more than normal.  Does appear generally weak and fatigued here but does not have any focal deficits.  She is awake, alert, fully oriented on questioning.  She has slightly tachypneic and tachycardic but lungs are clear.  Patient's labs are consistent with DKA with bicarb of 15 and increased anion gap of 19.  Her renal function appears higher than her baseline.  Troponin is slightly elevated at 70, however she is not having any chest pain.  EKG is unchanged from prior.  Chest x-ray is clear.  Patient does have history of CHF with EF of 25 to 30% so will be given 1 L fluid bolus to start and will start glucose stabilizer.  Patient's blood pressure is also elevated, likely from not having her home medications for several days.  Will give home meds here.  pH 7.305.  BP downtrending.  Able to tolerate PO medications.  Will admit to medicine.  Will need closing monitoring for fluid overload given her hx of CHF.  Discussed with Dr. Marlowe Sax-- will admit to SDU.  Final Clinical Impressions(s) / ED Diagnoses   Final diagnoses:  Diabetic ketoacidosis without coma associated with type 2 diabetes mellitus ([place])  AKI (acute kidney injury) [facility])    ED Discharge Orders    None       Larene Pickett, PA-C 06/16/19 [place], Delice Bison, DO 06/16/19 [phone]

## 2019-06-15 NOTE — ED Triage Notes (Signed)
Pt here from  Home with c/o lethargic and not eating and has not been taking her meds for the past 3 days

## 2019-06-16 ENCOUNTER — Inpatient Hospital Stay (HOSPITAL_COMMUNITY): Payer: Medicare Other

## 2019-06-16 ENCOUNTER — Observation Stay (HOSPITAL_COMMUNITY): Payer: Medicare Other

## 2019-06-16 ENCOUNTER — Ambulatory Visit: Payer: Medicare Other | Admitting: Family Medicine

## 2019-06-16 ENCOUNTER — Other Ambulatory Visit: Payer: Self-pay

## 2019-06-16 ENCOUNTER — Encounter (HOSPITAL_COMMUNITY): Payer: Self-pay | Admitting: General Practice

## 2019-06-16 DIAGNOSIS — N184 Chronic kidney disease, stage 4 (severe): Secondary | ICD-10-CM

## 2019-06-16 DIAGNOSIS — E785 Hyperlipidemia, unspecified: Secondary | ICD-10-CM | POA: Diagnosis present

## 2019-06-16 DIAGNOSIS — K219 Gastro-esophageal reflux disease without esophagitis: Secondary | ICD-10-CM | POA: Diagnosis present

## 2019-06-16 DIAGNOSIS — K828 Other specified diseases of gallbladder: Secondary | ICD-10-CM | POA: Diagnosis present

## 2019-06-16 DIAGNOSIS — Z7984 Long term (current) use of oral hypoglycemic drugs: Secondary | ICD-10-CM | POA: Diagnosis not present

## 2019-06-16 DIAGNOSIS — R4182 Altered mental status, unspecified: Secondary | ICD-10-CM

## 2019-06-16 DIAGNOSIS — E1110 Type 2 diabetes mellitus with ketoacidosis without coma: Secondary | ICD-10-CM

## 2019-06-16 DIAGNOSIS — I480 Paroxysmal atrial fibrillation: Secondary | ICD-10-CM | POA: Diagnosis present

## 2019-06-16 DIAGNOSIS — Z7901 Long term (current) use of anticoagulants: Secondary | ICD-10-CM | POA: Diagnosis not present

## 2019-06-16 DIAGNOSIS — I160 Hypertensive urgency: Secondary | ICD-10-CM | POA: Diagnosis present

## 2019-06-16 DIAGNOSIS — K8020 Calculus of gallbladder without cholecystitis without obstruction: Secondary | ICD-10-CM | POA: Diagnosis not present

## 2019-06-16 DIAGNOSIS — E1122 Type 2 diabetes mellitus with diabetic chronic kidney disease: Secondary | ICD-10-CM | POA: Diagnosis present

## 2019-06-16 DIAGNOSIS — G9341 Metabolic encephalopathy: Secondary | ICD-10-CM

## 2019-06-16 DIAGNOSIS — I5042 Chronic combined systolic (congestive) and diastolic (congestive) heart failure: Secondary | ICD-10-CM | POA: Diagnosis present

## 2019-06-16 DIAGNOSIS — G92 Toxic encephalopathy: Secondary | ICD-10-CM | POA: Diagnosis present

## 2019-06-16 DIAGNOSIS — G9340 Encephalopathy, unspecified: Secondary | ICD-10-CM | POA: Diagnosis not present

## 2019-06-16 DIAGNOSIS — D649 Anemia, unspecified: Secondary | ICD-10-CM | POA: Diagnosis present

## 2019-06-16 DIAGNOSIS — I248 Other forms of acute ischemic heart disease: Secondary | ICD-10-CM | POA: Diagnosis present

## 2019-06-16 DIAGNOSIS — N179 Acute kidney failure, unspecified: Secondary | ICD-10-CM

## 2019-06-16 DIAGNOSIS — Z888 Allergy status to other drugs, medicaments and biological substances status: Secondary | ICD-10-CM | POA: Diagnosis not present

## 2019-06-16 DIAGNOSIS — I2510 Atherosclerotic heart disease of native coronary artery without angina pectoris: Secondary | ICD-10-CM | POA: Diagnosis present

## 2019-06-16 DIAGNOSIS — Z20828 Contact with and (suspected) exposure to other viral communicable diseases: Secondary | ICD-10-CM | POA: Diagnosis present

## 2019-06-16 DIAGNOSIS — I6782 Cerebral ischemia: Secondary | ICD-10-CM | POA: Diagnosis not present

## 2019-06-16 DIAGNOSIS — Z8673 Personal history of transient ischemic attack (TIA), and cerebral infarction without residual deficits: Secondary | ICD-10-CM | POA: Diagnosis not present

## 2019-06-16 DIAGNOSIS — E860 Dehydration: Secondary | ICD-10-CM | POA: Diagnosis present

## 2019-06-16 DIAGNOSIS — E872 Acidosis: Secondary | ICD-10-CM | POA: Diagnosis present

## 2019-06-16 DIAGNOSIS — E876 Hypokalemia: Secondary | ICD-10-CM | POA: Diagnosis present

## 2019-06-16 DIAGNOSIS — I130 Hypertensive heart and chronic kidney disease with heart failure and stage 1 through stage 4 chronic kidney disease, or unspecified chronic kidney disease: Secondary | ICD-10-CM | POA: Diagnosis present

## 2019-06-16 DIAGNOSIS — Z833 Family history of diabetes mellitus: Secondary | ICD-10-CM | POA: Diagnosis not present

## 2019-06-16 HISTORY — DX: Type 2 diabetes mellitus with ketoacidosis without coma: E11.10

## 2019-06-16 LAB — POCT I-STAT EG7
Acid-base deficit: 7 mmol/L — ABNORMAL HIGH (ref 0.0–2.0)
Bicarbonate: 18.2 mmol/L — ABNORMAL LOW (ref 20.0–28.0)
Calcium, Ion: 1.09 mmol/L — ABNORMAL LOW (ref 1.15–1.40)
HCT: 32 % — ABNORMAL LOW (ref 36.0–46.0)
Hemoglobin: 10.9 g/dL — ABNORMAL LOW (ref 12.0–15.0)
O2 Saturation: 57 %
Potassium: 3.6 mmol/L (ref 3.5–5.1)
Sodium: 139 mmol/L (ref 135–145)
TCO2: 19 mmol/L — ABNORMAL LOW (ref 22–32)
pCO2, Ven: 36.5 mmHg — ABNORMAL LOW (ref 44.0–60.0)
pH, Ven: 7.305 (ref 7.250–7.430)
pO2, Ven: 32 mmHg (ref 32.0–45.0)

## 2019-06-16 LAB — HEPATIC FUNCTION PANEL
ALT: 224 U/L — ABNORMAL HIGH (ref 0–44)
AST: 250 U/L — ABNORMAL HIGH (ref 15–41)
Albumin: 2.6 g/dL — ABNORMAL LOW (ref 3.5–5.0)
Alkaline Phosphatase: 109 U/L (ref 38–126)
Bilirubin, Direct: 0.6 mg/dL — ABNORMAL HIGH (ref 0.0–0.2)
Indirect Bilirubin: 0.8 mg/dL (ref 0.3–0.9)
Total Bilirubin: 1.4 mg/dL — ABNORMAL HIGH (ref 0.3–1.2)
Total Protein: 5.9 g/dL — ABNORMAL LOW (ref 6.5–8.1)

## 2019-06-16 LAB — BASIC METABOLIC PANEL
Anion gap: 15 (ref 5–15)
Anion gap: 11 (ref 5–15)
BUN: 48 mg/dL — ABNORMAL HIGH (ref 8–23)
BUN: 48 mg/dL — ABNORMAL HIGH (ref 8–23)
CO2: 16 mmol/L — ABNORMAL LOW (ref 22–32)
CO2: 16 mmol/L — ABNORMAL LOW (ref 22–32)
Calcium: 8.6 mg/dL — ABNORMAL LOW (ref 8.9–10.3)
Calcium: 8.8 mg/dL — ABNORMAL LOW (ref 8.9–10.3)
Chloride: 107 mmol/L (ref 98–111)
Chloride: 112 mmol/L — ABNORMAL HIGH (ref 98–111)
Creatinine, Ser: 4.35 mg/dL — ABNORMAL HIGH (ref 0.44–1.00)
Creatinine, Ser: 4.38 mg/dL — ABNORMAL HIGH (ref 0.44–1.00)
GFR calc Af Amer: 12 mL/min — ABNORMAL LOW (ref >60)
GFR calc Af Amer: 12 mL/min — ABNORMAL LOW (ref >60)
GFR calc non Af Amer: 10 mL/min — ABNORMAL LOW (ref >60)
GFR calc non Af Amer: 10 mL/min — ABNORMAL LOW (ref >60)
Glucose, Bld: 218 mg/dL — ABNORMAL HIGH (ref 70–99)
Glucose, Bld: 116 mg/dL — ABNORMAL HIGH (ref 70–99)
Potassium: 2.8 mmol/L — ABNORMAL LOW (ref 3.5–5.1)
Potassium: 3.4 mmol/L — ABNORMAL LOW (ref 3.5–5.1)
Sodium: 138 mmol/L (ref 135–145)
Sodium: 139 mmol/L (ref 135–145)

## 2019-06-16 LAB — CBG MONITORING, ED
Glucose-Capillary: 101 mg/dL — ABNORMAL HIGH (ref 70–99)
Glucose-Capillary: 103 mg/dL — ABNORMAL HIGH (ref 70–99)
Glucose-Capillary: 120 mg/dL — ABNORMAL HIGH (ref 70–99)
Glucose-Capillary: 134 mg/dL — ABNORMAL HIGH (ref 70–99)
Glucose-Capillary: 151 mg/dL — ABNORMAL HIGH (ref 70–99)
Glucose-Capillary: 158 mg/dL — ABNORMAL HIGH (ref 70–99)
Glucose-Capillary: 162 mg/dL — ABNORMAL HIGH (ref 70–99)
Glucose-Capillary: 236 mg/dL — ABNORMAL HIGH (ref 70–99)
Glucose-Capillary: 325 mg/dL — ABNORMAL HIGH (ref 70–99)
Glucose-Capillary: 383 mg/dL — ABNORMAL HIGH (ref 70–99)

## 2019-06-16 LAB — VITAMIN B12: Vitamin B-12: 4278 pg/mL — ABNORMAL HIGH (ref 180–914)

## 2019-06-16 LAB — AMMONIA: Ammonia: 37 umol/L — ABNORMAL HIGH (ref 9–35)

## 2019-06-16 LAB — GLUCOSE, CAPILLARY
Glucose-Capillary: 102 mg/dL — ABNORMAL HIGH (ref 70–99)
Glucose-Capillary: 141 mg/dL — ABNORMAL HIGH (ref 70–99)
Glucose-Capillary: 140 mg/dL — ABNORMAL HIGH (ref 70–99)

## 2019-06-16 LAB — SARS CORONAVIRUS 2 (TAT 6-24 HRS): SARS Coronavirus 2: NEGATIVE

## 2019-06-16 LAB — MRSA PCR SCREENING: MRSA by PCR: NEGATIVE

## 2019-06-16 LAB — HEMOGLOBIN A1C
Hgb A1c MFr Bld: 8.7 % — ABNORMAL HIGH (ref 4.8–5.6)
Mean Plasma Glucose: 202.99 mg/dL

## 2019-06-16 LAB — TROPONIN I (HIGH SENSITIVITY)
Troponin I (High Sensitivity): 76 ng/L — ABNORMAL HIGH (ref <18)
Troponin I (High Sensitivity): 86 ng/L — ABNORMAL HIGH (ref <18)
Troponin I (High Sensitivity): 91 ng/L — ABNORMAL HIGH (ref <18)
Troponin I (High Sensitivity): 96 ng/L — ABNORMAL HIGH (ref <18)

## 2019-06-16 LAB — TSH: TSH: 1.65 u[IU]/mL (ref 0.350–4.500)

## 2019-06-16 MED ORDER — WARFARIN SODIUM 5 MG PO TABS
10.0000 mg | ORAL_TABLET | Freq: Once | ORAL | Status: AC
  Administered 2019-06-16: 10 mg via ORAL
  Filled 2019-06-16: qty 1
  Filled 2019-06-16: qty 2

## 2019-06-16 MED ORDER — TICAGRELOR 90 MG PO TABS
90.0000 mg | ORAL_TABLET | Freq: Two times a day (BID) | ORAL | Status: DC
  Administered 2019-06-16 – 2019-06-18 (×4): 90 mg via ORAL
  Filled 2019-06-16 (×5): qty 1

## 2019-06-16 MED ORDER — DEXTROSE-NACL 5-0.45 % IV SOLN
INTRAVENOUS | Status: DC
  Administered 2019-06-16: 03:00:00 via INTRAVENOUS

## 2019-06-16 MED ORDER — POTASSIUM CHLORIDE 10 MEQ/100ML IV SOLN
10.0000 meq | INTRAVENOUS | Status: AC
  Administered 2019-06-16 (×2): 10 meq via INTRAVENOUS
  Filled 2019-06-16: qty 100

## 2019-06-16 MED ORDER — HYDRALAZINE HCL 20 MG/ML IJ SOLN
10.0000 mg | INTRAMUSCULAR | Status: DC | PRN
  Administered 2019-06-16: 10 mg via INTRAVENOUS
  Filled 2019-06-16: qty 1

## 2019-06-16 MED ORDER — LABETALOL HCL 5 MG/ML IV SOLN: 10.0000 mg | INTRAVENOUS | Status: DC | PRN

## 2019-06-16 MED ORDER — INSULIN GLARGINE 100 UNIT/ML ~~LOC~~ SOLN
4.0000 [IU] | Freq: Every day | SUBCUTANEOUS | Status: DC
  Administered 2019-06-16 – 2019-06-18 (×3): 4 [IU] via SUBCUTANEOUS
  Filled 2019-06-16 (×3): qty 0.04

## 2019-06-16 MED ORDER — INSULIN ASPART 100 UNIT/ML ~~LOC~~ SOLN: 0.0000 [IU] | Freq: Every day | SUBCUTANEOUS | Status: DC

## 2019-06-16 MED ORDER — FERROUS SULFATE 325 (65 FE) MG PO TABS
325.0000 mg | ORAL_TABLET | ORAL | Status: DC
  Administered 2019-06-17 – 2019-06-18 (×2): 325 mg via ORAL
  Filled 2019-06-16 (×2): qty 1

## 2019-06-16 MED ORDER — SODIUM CHLORIDE 0.9 % IV SOLN
INTRAVENOUS | Status: DC
  Administered 2019-06-16: 19:00:00 via INTRAVENOUS

## 2019-06-16 MED ORDER — POTASSIUM CHLORIDE CRYS ER 10 MEQ PO TBCR
10.0000 meq | EXTENDED_RELEASE_TABLET | Freq: Two times a day (BID) | ORAL | Status: DC
  Administered 2019-06-16 – 2019-06-18 (×4): 10 meq via ORAL
  Filled 2019-06-16 (×4): qty 1

## 2019-06-16 MED ORDER — ATORVASTATIN CALCIUM 40 MG PO TABS
40.0000 mg | ORAL_TABLET | Freq: Every day | ORAL | Status: DC
  Administered 2019-06-16 – 2019-06-17 (×2): 40 mg via ORAL
  Filled 2019-06-16 (×2): qty 1

## 2019-06-16 MED ORDER — INSULIN ASPART 100 UNIT/ML ~~LOC~~ SOLN: 0.0000 [IU] | Freq: Three times a day (TID) | SUBCUTANEOUS | Status: DC

## 2019-06-16 MED ORDER — ISOSORBIDE DINITRATE 20 MG PO TABS
20.0000 mg | ORAL_TABLET | Freq: Three times a day (TID) | ORAL | Status: DC
  Administered 2019-06-16 – 2019-06-18 (×5): 20 mg via ORAL
  Filled 2019-06-16 (×10): qty 1

## 2019-06-16 MED ORDER — NITROGLYCERIN 0.4 MG SL SUBL: 0.4000 mg | SUBLINGUAL_TABLET | SUBLINGUAL | Status: DC | PRN

## 2019-06-16 MED ORDER — INSULIN ASPART 100 UNIT/ML ~~LOC~~ SOLN
0.0000 [IU] | Freq: Four times a day (QID) | SUBCUTANEOUS | Status: DC
  Administered 2019-06-16: 2 [IU] via SUBCUTANEOUS
  Administered 2019-06-16 – 2019-06-17 (×3): 1 [IU] via SUBCUTANEOUS

## 2019-06-16 MED ORDER — INSULIN REGULAR(HUMAN) IN NACL 100-0.9 UT/100ML-% IV SOLN: INTRAVENOUS | Status: DC

## 2019-06-16 MED ORDER — WARFARIN - PHARMACIST DOSING INPATIENT
Freq: Every day | Status: DC
  Administered 2019-06-17: 17:00:00

## 2019-06-16 MED ORDER — HYDRALAZINE HCL 50 MG PO TABS
100.0000 mg | ORAL_TABLET | Freq: Three times a day (TID) | ORAL | Status: DC
  Administered 2019-06-16 – 2019-06-18 (×5): 100 mg via ORAL
  Filled 2019-06-16: qty 2
  Filled 2019-06-16: qty 10
  Filled 2019-06-16 (×8): qty 2

## 2019-06-16 MED ORDER — CARVEDILOL 25 MG PO TABS
25.0000 mg | ORAL_TABLET | Freq: Two times a day (BID) | ORAL | Status: DC
  Administered 2019-06-16 – 2019-06-17 (×2): 25 mg via ORAL
  Filled 2019-06-16 (×2): qty 1

## 2019-06-16 MED ORDER — TECHNETIUM TC 99M MEBROFENIN IV KIT
5.2600 | PACK | Freq: Once | INTRAVENOUS | Status: AC | PRN
  Administered 2019-06-16: 5.26 via INTRAVENOUS

## 2019-06-16 NOTE — Progress Notes (Signed)
PROGRESS NOTE    Amber Young  [id number] DOB: 1956-01-29 DOA: 06/15/2019 PCP: Azzie Glatter, FNP   Please see admission note 11/16 morning for more detail. Brief Narrative:  63 year old female with anemia, chronic combined systolic and diastolic congestive heart failure EF 25 to 30% June 2020, PAF on Coumadin, CKD stage IV baseline creatinine 2.5-3.6 in June 20 and 4.9 in September 15, CAD status post PCI, hypertension, hyperlipidemia, CVA, non-insulin-dependent diabetes mellitus, GERD admitted with altered mental status since 11/5. In the ER tachycardic tachypneic slightly blood pressure initially 210 saturating well on room air afebrile no leukocytosis blood glucose uncontrolled at 441 bicarb 15 anion gap 19 creatinine 4.6 BUN 30, chest x-ray with cardiomegaly no active disease, EKG without acute ischemic changes high-sensitivity troponin 70, COVID-19 negative.  I called daughter per daughter-she was not eating or drinking x Monday, on Wednesday night confused. Was not complaining anything, no N/VAbd pain/Chest pain/fever.   Patient was placed on insulin drip and was admitted.   11/6-morning off insulin drip at 6.30 seems she got for 6 hrs or so of insulin gtt- blood sugar in 100s, anion gap has closed at 11, still n.p.o. has not gotten any subcu insulin.  Subjective: Lethargic replying only with "yes", briefly wakes up but goes back to sleep. Moving all extremities nonpurposefully  Has been like this since presentation per nursing report. No urine output charted has external Foley catheter Blood sugar in the low 100.  Assessment & Plan:  Hyperglycemia with metabolic acidosis suspected DKA: Off insulin drip this morning start Lantus 4 units to prevent reverting back to DKA, start diet speech eval, ssi.  Patient is lethargic likely wont be able to eat.  Blood sugar will be monitored every 2 hours  Metabolic acidosis likely combination of her CKD also complains of DKA, urine  pending to check for ketones CKD.  Acute encephalopathy:Likely multifactorial in the setting of metabolic acidosis hyperglycemia possible DKA, also with uncontrolled hypertension.Nonfocal on exam still encephalopathic not following commands.  CT head was unremarkable. No evidence of infection, fever no leukocytosis, chest x-ray NAD- COVID-19 negative.  Continue gentle IV fluids speech eval PT OT fall precaution.  Patient not making much improvement,Discuss with Dr Rory Percy from neurology for consult- request EEG, MRI,abg. Monitor neuro check.  Obtained  TSH B12 and ammonia level-fairly stable. VBG for co2 retention.Per daughter   Elevated troponin- 70-91-96. No complaints form pt. Getting cardio input.  AKI on CKD stage IV baseline creatinine 2.5-3.6 in June 20 and 4.9 in September 15- check renal US, no output charted, cont gentle ivf- caution for fluid overload given her congestive heart failure.  HLD: On Lipitor at home.  CAD s/p PCI resume her home Brilinta, statin beta-blocker once water orally.  Chronic systolic/diastolic CHF with EF 25 to 30%: Patient received 1 L of fluid in the ER and was on IV fluid, will cut down fluid to 50 mils per hour avoid fluid overload given CHF.  Continue gentle fluids while not taking orally.  Hypertension with urgency: On presentation blood pressure was in 210-20 140s.  Patient did take her antihypertensives in the ER.  She has not taken her blood pressure medication for past 4 days. Blood pressure poorly controlled continue IV as needed hydralazine and labetalol.  Normally she is on Coreg, hydralazine Isordil losartan Lasix with metolazone at home  PAF on coumadin-pharmacy to dose.  Sinus bradycardia and monitor.  Hx of CVA (mild) in 2017 w/o residual deficits  During renal ultrasound  noted to have" gallbladder demonstrates circumferential gallbladder wall thickening of at least 1 cm. Echogenic material within the gallbladder lumen.Sonographic Murphy's sign  not assessed given patient mental status."- radio advised HIDA scan.  No report of abdominal pain, patient without leukocytosis or fever doubt acute cholecystitis.  DVT prophylaxis: coumadin/Heparin sq while INR <2 Code Status: FULL CODE Family Communication: plan of care discussed with patient's daughter in length. Disposition Plan: Patient will need at least 2 midnight stay for the work-up and treatment of acute encephalopathy, and DKA, AKI.  Consultants: neurology  Procedures: none  Microbiology: [id number] negative  Antimicrobials: Anti-infectives (From admission, onward)   None       Objective: Vitals:   06/16/19 1220 06/16/19 1230 06/16/19 1300 06/16/19 1330  BP: (!) 203/96 (!) 198/98 (!) 169/69 (!) 164/68  Pulse: 69 73 (!) 57 (!) 54  Resp:      Temp:      TempSrc:      SpO2: 100% 100% 98% 98%    Intake/Output Summary (Last 24 hours) at 06/16/2019 1400 Last data filed at 06/16/2019 [id number] Gross per 24 hour  Intake 1550 ml  Output -  Net 1550 ml   There were no vitals filed for this visit. Weight change:   There is no height or weight on file to calculate BMI.  Intake/Output from previous day: 11/05 0701 - 11/06 0700 In: 1450 [I.V.:450; IV Piggyback:1000] Out: -  Intake/Output this shift: Total I/O In: 100 [IV Piggyback:100] Out: -   Examination:  General exam: Lethargic, wakes up and evaluated me her name saying mostly "yes" HEENT:Oral mucosa moist, Ear/Nose WNL grossly, dentition normal. Respiratory system: Diminished at the base,no wheezing or crackles,no use of accessory muscle Cardiovascular system: S1 & S2 +, No JVD,. Gastrointestinal system: Abdomen soft, obese,ND, BS+ Nervous System: Lethargic moving lower extremities equally. Extremities: No edema, distal peripheral pulses palpable.  Skin: No rashes,no icterus. MSK: Normal muscle bulk,tone, power  Medications:  Scheduled Meds: . atorvastatin  40 mg Oral q1800  . carvedilol  25 mg Oral BID WC   . ferrous sulfate  325 mg Oral QODAY  . hydrALAZINE  100 mg Oral TID  . insulin aspart  0-5 Units Subcutaneous QHS  . insulin aspart  0-9 Units Subcutaneous Q6H  . insulin glargine  4 Units Subcutaneous Daily  . isosorbide dinitrate  20 mg Oral TID  . potassium chloride  10 mEq Oral BID  . sodium chloride flush  3 mL Intravenous Once  . ticagrelor  90 mg Oral BID  . warfarin  10 mg Oral ONCE-1800  . Warfarin - Pharmacist Dosing Inpatient   Does not apply q1800   Continuous Infusions: . sodium chloride 75 mL/hr at 06/16/19 0956  . dextrose 5 % and 0.45% NaCl Stopped (06/16/19 [id number])    Data Reviewed: I have personally reviewed following labs and imaging studies  CBC: Recent Labs  Lab 06/15/19 1748 06/16/19 0027  WBC 6.2  --   HGB 10.0* 10.9*  HCT 32.6* 32.0*  MCV 80.7  --   PLT 306  --    Basic Metabolic Panel: Recent Labs  Lab 06/15/19 1748 06/16/19 0027 06/16/19 0323 06/16/19 0550  NA 136 139 138 139  K 3.7 3.6 2.8* 3.4*  CL 102  --  107 112*  CO2 15*  --  16* 16*  GLUCOSE 441*  --  218* 116*  BUN 50*  --  48* 48*  CREATININE 4.63*  --  4.35* 4.38*  CALCIUM 9.0  --  8.6* 8.8*   GFR: CrCl cannot be calculated (Unknown ideal weight.). Liver Function Tests: No results for input(s): AST, ALT, ALKPHOS, BILITOT, PROT, ALBUMIN in the last 168 hours. No results for input(s): LIPASE, AMYLASE in the last 168 hours. Recent Labs  Lab 06/16/19 0926  AMMONIA 37*   Coagulation Profile: Recent Labs  Lab 06/15/19 1748  INR 1.3*   Cardiac Enzymes: No results for input(s): CKTOTAL, CKMB, CKMBINDEX, TROPONINI in the last 168 hours. BNP (last 3 results) No results for input(s): PROBNP in the last 8760 hours. HbA1C: Recent Labs    06/16/19 0650  HGBA1C 8.7*   CBG: Recent Labs  Lab 06/16/19 0459 06/16/19 0614 06/16/19 0804 06/16/19 1037 06/16/19 1157  GLUCAP 120* 103* 101* 134* 151*   Lipid Profile: No results for input(s): CHOL, HDL, LDLCALC, TRIG,  CHOLHDL, LDLDIRECT in the last 72 hours. Thyroid Function Tests: Recent Labs    06/16/19 0926  TSH 1.650   Anemia Panel: Recent Labs    06/16/19 0926  VITAMINB12 4,278*   Sepsis Labs: No results for input(s): PROCALCITON, LATICACIDVEN in the last 168 hours.  Recent Results (from the past 240 hour(s))  SARS CORONAVIRUS 2 (TAT 6-24 HRS) Nasopharyngeal Nasopharyngeal Swab     Status: None   Collection Time: 06/16/19 12:10 AM   Specimen: Nasopharyngeal Swab  Result Value Ref Range Status   SARS Coronavirus 2 NEGATIVE NEGATIVE Final    Comment: (NOTE) SARS-CoV-2 target nucleic acids are NOT DETECTED. The SARS-CoV-2 RNA is generally detectable in upper and lower respiratory specimens during the acute phase of infection. Negative results do not preclude SARS-CoV-2 infection, do not rule out co-infections with other pathogens, and should not be used as the sole basis for treatment or other patient management decisions. Negative results must be combined with clinical observations, patient history, and epidemiological information. The expected result is Negative. Fact Sheet for Patients: SugarRoll.be Fact Sheet for Healthcare Providers: https://www.woods-mathews.com/ This test is not yet approved or cleared by the [place] FDA and  has been authorized for detection and/or diagnosis of SARS-CoV-2 by FDA under an Emergency Use Authorization (EUA). This EUA will remain  in effect (meaning this test can be used) for the duration of the COVID-19 declaration under Section 56 4(b)(1) of the Act, 21 U.S.C. section 360bbb-3(b)(1), unless the authorization is terminated or revoked sooner. Performed at [facility], [place] [address], [place], [place] [address]       Radiology Studies: Dg Chest 2 View  Result Date: 06/15/2019 CLINICAL DATA:  Weakness, loss of appetite EXAM: CHEST - 2 VIEW COMPARISON:  01/23/2019 FINDINGS: Cardiomegaly.  No confluent opacities, effusions or edema. No acute bony abnormality. Old healed proximal left humeral fracture. IMPRESSION: Cardiomegaly.  No active disease. Electronically Signed   By: Rolm Baptise M.D.   On: 06/15/2019 19:10   Ct Head Wo Contrast  Result Date: 06/16/2019 CLINICAL DATA:  Altered mental status EXAM: CT HEAD WITHOUT CONTRAST TECHNIQUE: Contiguous axial images were obtained from the base of the skull through the vertex without intravenous contrast. COMPARISON:  CT head dated 09/30/2014 FINDINGS: Brain: No evidence of acute infarction, hemorrhage, hydrocephalus, extra-axial collection or mass lesion/mass effect. Atrophy and chronic microvascular ischemic changes are noted. Vascular: No hyperdense vessel or unexpected calcification. Skull: Normal. Negative for fracture or focal lesion. Sinuses/Orbits: No acute finding. Other: None. IMPRESSION: No acute intracranial abnormality Electronically Signed   By: Constance Holster M.D.   On: 06/16/2019 03:17   US Renal  Result Date:  06/16/2019 CLINICAL DATA:  63 year old female with a history of acute kidney injury EXAM: RENAL / URINARY TRACT ULTRASOUND COMPLETE COMPARISON:  None. FINDINGS: Right Kidney: Length: 11.0 cm x 4.3 cm x 4.7 cm, 117 cc. Echogenicity within normal limits. No mass or hydronephrosis visualized. Left Kidney: Length: 10.5 cm x 6.3 cm x 6.4 cm, 221 cc. Echogenicity within normal limits. No mass or hydronephrosis visualized. Bladder: Bladder partially distended.  Bladder jets not visualized. Additional: Gallbladder demonstrates circumferential gallbladder wall thickening of at least 1 cm. Echogenic material within the gallbladder lumen. Sonographic Murphy's sign not assessed given patient mental status. IMPRESSION: Negative for hydronephrosis. **An incidental finding of potential clinical significance has been found. Circumferential gallbladder wall thickening with potential sludge/stones, may represent incidental finding of  cholecystitis. If there is concern for ductal obstruction and acute cholecystitis, recommend correlation with patient presentation, lab values and potentially HIDA study.** Electronically Signed   By: Corrie Mckusick D.O.   On: 06/16/2019 09:54      LOS: 0 days   Time spent: More than 50% of that time was spent in counseling and/or coordination of care.  Antonieta Pert, MD [facility]  06/16/2019, 2:00 PM

## 2019-06-16 NOTE — ED Notes (Signed)
Pt has had 3 glucose readings under 250. Paged MD to see if D/C order

## 2019-06-16 NOTE — ED Notes (Signed)
Call for updates: [phone] Pearson Grippe (daughter)

## 2019-06-16 NOTE — ED Notes (Signed)
ED TO INPATIENT HANDOFF REPORT  ED Nurse Name and Phone #: Lorrin Goodell [phone]  S Name/Age/Gender Amber Young 63 y.o. female Room/Bed: 033C/033C  Code Status   Code Status: Full Code  Home/SNF/Other Home  Is this baseline? No   Triage Complete: Triage complete  Chief Complaint lethargic not eating  Triage Note Pt here from  Home with c/o lethargic and not eating and has not been taking her meds for the past 3 days    Allergies Allergies  Allergen Reactions  . Ace Inhibitors Cough    Level of Care/Admitting Diagnosis ED Disposition    ED Disposition Condition [facility] Area: [place] [[id number]]  Level of Care: Progressive [102]  Admit to Progressive based on following criteria: MULTISYSTEM THREATS such as stable sepsis, metabolic/electrolyte imbalance with or without encephalopathy that is responding to early treatment.  Covid Evaluation: Asymptomatic Screening Protocol (No Symptoms)  Diagnosis: Acute encephalopathy [id number]  Admitting Physician: Antonieta Pert [id number]  Attending Physician: Antonieta Pert [id number]  Estimated length of stay: past midnight tomorrow  Certification:: I certify this patient will need inpatient services for at least 2 midnights  PT Class (Do Not Modify): Inpatient [101]  PT Acc Code (Do Not Modify): Private [1]       B Medical/Surgery History Past Medical History:  Diagnosis Date  . Anemia   . Chronic combined systolic and diastolic CHF (congestive heart failure) ([place])    a. EF 40-45% in 2019 b. EF 25-30% by repeat echo in 01/2019  . CKD (chronic kidney disease), stage IV ([place])   . Coronary artery disease 08/2017   DES x 2 RCA  . GERD (gastroesophageal reflux disease)   . Heart murmur   . Hyperlipidemia   . Hypertension   . Proliferative diabetic retinopathy ([place])    left eye with vitreous hemorrhage and tractional retinal detachment  . Stroke ([place]) 09/2014   numbness left upper lip, finger tips  on left hand; "resolved" (05/18/2016)  . Type II diabetes mellitus ([place])   . Wears glasses    Past Surgical History:  Procedure Laterality Date  . CARDIAC CATHETERIZATION N/A 05/18/2016   Procedure: Left Heart Cath and Coronary Angiography;  Surgeon: Jettie Booze, MD;  Location: [facility];  Service: Cardiovascular;  Laterality: N/A;  . CARDIAC CATHETERIZATION N/A 05/18/2016   Procedure: Coronary Stent Intervention;  Surgeon: Jettie Booze, MD;  Location: [facility];  Service: Cardiovascular;  Laterality: N/A;  . COLONOSCOPY W/ BIOPSIES AND POLYPECTOMY    . CORONARY ANGIOPLASTY    . CORONARY ANGIOPLASTY WITH STENT PLACEMENT    . CORONARY STENT INTERVENTION N/A 09/07/2017   Procedure: CORONARY STENT INTERVENTION;  Surgeon: Leonie Man, MD;  Location: [facility];  Service: Cardiovascular;  Laterality: N/A;  . EYE SURGERY    . GAS INSERTION Left 01/13/2018   Procedure: INSERTION OF GAS;  Surgeon: Bernarda Caffey, MD;  Location: [facility];  Service: Ophthalmology;  Laterality: Left;  . LEFT HEART CATH AND CORONARY ANGIOGRAPHY N/A 09/07/2017   Procedure: LEFT HEART CATH AND CORONARY ANGIOGRAPHY;  Surgeon: Leonie Man, MD;  Location: [facility];  Service: Cardiovascular;  Laterality: N/A;  . MEMBRANE PEEL Left 01/13/2018   Procedure: MEMBRANE PEEL LEFT EYE ;  Surgeon: Bernarda Caffey, MD;  Location: [facility];  Service: Ophthalmology;  Laterality: Left;  Marland Kitchen MULTIPLE TOOTH EXTRACTIONS    . PARS PLANA VITRECTOMY Left 01/13/2018   Procedure: PARS PLANA  VITRECTOMY WITH 25 GAUGE LEFT EYE WITH ENDOLASER;  Surgeon: Bernarda Caffey, MD;  Location: [facility];  Service: Ophthalmology;  Laterality: Left;  . TUBAL LIGATION  1980  . VAGINAL HYSTERECTOMY  1999   "partial; fibroids"     A IV Location/Drains/Wounds Patient Lines/Drains/Airways Status   Active Line/Drains/Airways    Name:   Placement date:   Placement time:   Site:   Days:   Peripheral IV 01/23/19 Left  Antecubital   01/23/19    1154    Antecubital   144   Peripheral IV 06/16/19 Left;Lateral Forearm   06/16/19    0030    Forearm   less than 1          Intake/Output Last 24 hours  Intake/Output Summary (Last 24 hours) at 06/16/2019 1517 Last data filed at 06/16/2019 [id number] Gross per 24 hour  Intake 1550 ml  Output -  Net 1550 ml    Labs/Imaging Results for orders placed or performed during the hospital encounter of 06/15/19 (from the past 48 hour(s))  Troponin I (High Sensitivity)     Status: Abnormal   Collection Time: 06/15/19 12:09 AM  Result Value Ref Range   Troponin I (High Sensitivity) 76 (H) <18 ng/L    Comment: (NOTE) Elevated high sensitivity troponin I (hsTnI) values and significant  changes across serial measurements may suggest ACS but many other  chronic and acute conditions are known to elevate hsTnI results.  Refer to the "Links" section for chest pain algorithms and additional  guidance. Performed at [facility], [place] [address], [place], [place] [address]   CBG monitoring, ED     Status: Abnormal   Collection Time: 06/15/19  5:46 PM  Result Value Ref Range   Glucose-Capillary 412 (H) 70 - 99 mg/dL  Basic metabolic panel     Status: Abnormal   Collection Time: 06/15/19  5:48 PM  Result Value Ref Range   Sodium 136 135 - 145 mmol/L   Potassium 3.7 3.5 - 5.1 mmol/L   Chloride 102 98 - 111 mmol/L   CO2 15 (L) 22 - 32 mmol/L   Glucose, Bld 441 (H) 70 - 99 mg/dL   BUN 50 (H) 8 - 23 mg/dL   Creatinine, Ser 4.63 (H) 0.44 - 1.00 mg/dL   Calcium 9.0 8.9 - 10.3 mg/dL   GFR calc non Af Amer 9 (L) >60 mL/min   GFR calc Af Amer 11 (L) >60 mL/min   Anion gap 19 (H) 5 - 15    Comment: Performed at [facility], [place] [address], [place], [place] [address]  CBC     Status: Abnormal   Collection Time: 06/15/19  5:48 PM  Result Value Ref Range   WBC 6.2 4.0 - 10.5 K/uL   RBC 4.04 3.87 - 5.11 MIL/uL   Hemoglobin 10.0 (L) 12.0 - 15.0 g/dL   HCT 32.6 (L)  36.0 - 46.0 %   MCV 80.7 80.0 - 100.0 fL   MCH 24.8 (L) 26.0 - 34.0 pg   MCHC 30.7 30.0 - 36.0 g/dL   RDW 16.4 (H) 11.5 - 15.5 %   Platelets 306 150 - 400 K/uL   nRBC 0.5 (H) 0.0 - 0.2 %    Comment: Performed at [facility] [address], [place], [place] [address]  Troponin I (High Sensitivity)     Status: Abnormal   Collection Time: 06/15/19  5:48 PM  Result Value Ref Range   Troponin  I (High Sensitivity) 70 (H) <18 ng/L    Comment: (NOTE) Elevated high sensitivity troponin I (hsTnI) values and significant  changes across serial measurements may suggest ACS but many other  chronic and acute conditions are known to elevate hsTnI results.  Refer to the "Links" section for chest pain algorithms and additional  guidance. Performed at [facility], [place] [address], [place], [place] [address]   Protime-INR     Status: Abnormal   Collection Time: 06/15/19  5:48 PM  Result Value Ref Range   Prothrombin Time 16.4 (H) 11.4 - 15.2 seconds   INR 1.3 (H) 0.8 - 1.2    Comment: (NOTE) INR goal varies based on device and disease states. Performed at [facility], [place] [address], [place], [place] [address]   CBG monitoring, ED     Status: Abnormal   Collection Time: 06/15/19 11:51 PM  Result Value Ref Range   Glucose-Capillary 383 (H) 70 - 99 mg/dL  SARS CORONAVIRUS 2 (TAT 6-24 HRS) Nasopharyngeal Nasopharyngeal Swab     Status: None   Collection Time: 06/16/19 12:10 AM   Specimen: Nasopharyngeal Swab  Result Value Ref Range   SARS Coronavirus 2 NEGATIVE NEGATIVE    Comment: (NOTE) SARS-CoV-2 target nucleic acids are NOT DETECTED. The SARS-CoV-2 RNA is generally detectable in upper and lower respiratory specimens during the acute phase of infection. Negative results do not preclude SARS-CoV-2 infection, do not rule out co-infections with other pathogens, and should not be used as the sole basis for treatment or other patient management decisions. Negative results  must be combined with clinical observations, patient history, and epidemiological information. The expected result is Negative. Fact Sheet for Patients: SugarRoll.be Fact Sheet for Healthcare Providers: https://www.woods-mathews.com/ This test is not yet approved or cleared by the [place] FDA and  has been authorized for detection and/or diagnosis of SARS-CoV-2 by FDA under an Emergency Use Authorization (EUA). This EUA will remain  in effect (meaning this test can be used) for the duration of the COVID-19 declaration under Section 56 4(b)(1) of the Act, 21 U.S.C. section 360bbb-3(b)(1), unless the authorization is terminated or revoked sooner. Performed at [facility], [place] [address] [place],  [address]   POCT I-Stat EG7     Status: Abnormal   Collection Time: 06/16/19 12:27 AM  Result Value Ref Range   pH, Ven 7.305 7.250 - 7.430   pCO2, Ven 36.5 (L) 44.0 - 60.0 mmHg   pO2, Ven 32.0 32.0 - 45.0 mmHg   Bicarbonate 18.2 (L) 20.0 - 28.0 mmol/L   TCO2 19 (L) 22 - 32 mmol/L   O2 Saturation 57.0 %   Acid-base deficit 7.0 (H) 0.0 - 2.0 mmol/L   Sodium 139 135 - 145 mmol/L   Potassium 3.6 3.5 - 5.1 mmol/L   Calcium, Ion 1.09 (L) 1.15 - 1.40 mmol/L   HCT 32.0 (L) 36.0 - 46.0 %   Hemoglobin 10.9 (L) 12.0 - 15.0 g/dL   Patient temperature HIDE    Sample type VENOUS    Comment NOTIFIED PHYSICIAN   CBG monitoring, ED     Status: Abnormal   Collection Time: 06/16/19  1:12 AM  Result Value Ref Range   Glucose-Capillary 325 (H) 70 - 99 mg/dL  CBG monitoring, ED     Status: Abnormal   Collection Time: 06/16/19  2:36 AM  Result Value Ref Range   Glucose-Capillary 236 (H) 70 - 99 mg/dL  Basic metabolic panel  Status: Abnormal   Collection Time: 06/16/19  3:23 AM  Result Value Ref Range   Sodium 138 135 - 145 mmol/L   Potassium 2.8 (L) 3.5 - 5.1 mmol/L   Chloride 107 98 - 111 mmol/L   CO2 16 (L) 22 - 32 mmol/L   Glucose,  Bld 218 (H) 70 - 99 mg/dL   BUN 48 (H) 8 - 23 mg/dL   Creatinine, Ser 4.35 (H) 0.44 - 1.00 mg/dL   Calcium 8.6 (L) 8.9 - 10.3 mg/dL   GFR calc non Af Amer 10 (L) >60 mL/min   GFR calc Af Amer 12 (L) >60 mL/min   Anion gap 15 5 - 15    Comment: Performed at [facility] [address] [place], [place] [address]  Troponin I (High Sensitivity)     Status: Abnormal   Collection Time: 06/16/19  3:23 AM  Result Value Ref Range   Troponin I (High Sensitivity) 86 (H) <18 ng/L    Comment: (NOTE) Elevated high sensitivity troponin I (hsTnI) values and significant  changes across serial measurements may suggest ACS but many other  chronic and acute conditions are known to elevate hsTnI results.  Refer to the Links section for chest pain algorithms and additional  guidance. Performed at [facility], [place] [address] [place], [place] [address]   CBG monitoring, ED     Status: Abnormal   Collection Time: 06/16/19  3:47 AM  Result Value Ref Range   Glucose-Capillary 162 (H) 70 - 99 mg/dL  CBG monitoring, ED     Status: Abnormal   Collection Time: 06/16/19  4:59 AM  Result Value Ref Range   Glucose-Capillary 120 (H) 70 - 99 mg/dL  Basic metabolic panel     Status: Abnormal   Collection Time: 06/16/19  5:50 AM  Result Value Ref Range   Sodium 139 135 - 145 mmol/L   Potassium 3.4 (L) 3.5 - 5.1 mmol/L   Chloride 112 (H) 98 - 111 mmol/L   CO2 16 (L) 22 - 32 mmol/L   Glucose, Bld 116 (H) 70 - 99 mg/dL   BUN 48 (H) 8 - 23 mg/dL   Creatinine, Ser 4.38 (H) 0.44 - 1.00 mg/dL   Calcium 8.8 (L) 8.9 - 10.3 mg/dL   GFR calc non Af Amer 10 (L) >60 mL/min   GFR calc Af Amer 12 (L) >60 mL/min   Anion gap 11 5 - 15    Comment: Performed at [facility] [address] [place], [place] [address]  Troponin I (High Sensitivity)     Status: Abnormal   Collection Time: 06/16/19  5:50 AM  Result Value Ref Range   Troponin I (High Sensitivity) 91 (H) <18 ng/L    Comment: (NOTE) Elevated  high sensitivity troponin I (hsTnI) values and significant  changes across serial measurements may suggest ACS but many other  chronic and acute conditions are known to elevate hsTnI results.  Refer to the Links section for chest pain algorithms and additional  guidance. Performed at [facility], [place] [address] [place], [place] [address]   CBG monitoring, ED     Status: Abnormal   Collection Time: 06/16/19  6:14 AM  Result Value Ref Range   Glucose-Capillary 103 (H) 70 - 99 mg/dL  Hemoglobin A1c     Status: Abnormal   Collection Time: 06/16/19  6:50 AM  Result Value Ref Range   Hgb A1c MFr Bld 8.7 (H) 4.8 -  5.6 %    Comment: (NOTE) Pre diabetes:          5.7%-6.4% Diabetes:              >6.4% Glycemic control for   <7.0% adults with diabetes    Mean Plasma Glucose 202.99 mg/dL    Comment: Performed at [facility], [place] [address] [place], [place] [address]  CBG monitoring, ED     Status: Abnormal   Collection Time: 06/16/19  8:04 AM  Result Value Ref Range   Glucose-Capillary 101 (H) 70 - 99 mg/dL   Comment 1 Notify RN    Comment 2 Document in Chart   Ammonia     Status: Abnormal   Collection Time: 06/16/19  9:26 AM  Result Value Ref Range   Ammonia 37 (H) 9 - 35 umol/L    Comment: Performed at [facility], [place] [address] [place], [place] [address]  TSH     Status: None   Collection Time: 06/16/19  9:26 AM  Result Value Ref Range   TSH 1.650 0.350 - 4.500 uIU/mL    Comment: Performed by a 3rd Generation assay with a functional sensitivity of <=0.01 uIU/mL. Performed at [facility], [place] [address] [place], [place] [address]   Troponin I (High Sensitivity)     Status: Abnormal   Collection Time: 06/16/19  9:26 AM  Result Value Ref Range   Troponin I (High Sensitivity) 96 (H) <18 ng/L    Comment: (NOTE) Elevated high sensitivity troponin I (hsTnI) values and significant  changes across serial measurements may suggest ACS but many other   chronic and acute conditions are known to elevate hsTnI results.  Refer to the "Links" section for chest pain algorithms and additional  guidance. Performed at [facility], [place] [address], [place], [place] [address]   Vitamin B12     Status: Abnormal   Collection Time: 06/16/19  9:26 AM  Result Value Ref Range   Vitamin B-12 4,278 (H) 180 - 914 pg/mL    Comment: (NOTE) This assay is not validated for testing neonatal or myeloproliferative syndrome specimens for Vitamin B12 levels. Performed at [facility], [place] [address], [place], [place] [address]   CBG monitoring, ED     Status: Abnormal   Collection Time: 06/16/19 10:37 AM  Result Value Ref Range   Glucose-Capillary 134 (H) 70 - 99 mg/dL  CBG monitoring, ED     Status: Abnormal   Collection Time: 06/16/19 11:57 AM  Result Value Ref Range   Glucose-Capillary 151 (H) 70 - 99 mg/dL  CBG monitoring, ED     Status: Abnormal   Collection Time: 06/16/19  2:17 PM  Result Value Ref Range   Glucose-Capillary 158 (H) 70 - 99 mg/dL   Dg Chest 2 View  Result Date: 06/15/2019 CLINICAL DATA:  Weakness, loss of appetite EXAM: CHEST - 2 VIEW COMPARISON:  01/23/2019 FINDINGS: Cardiomegaly. No confluent opacities, effusions or edema. No acute bony abnormality. Old healed proximal left humeral fracture. IMPRESSION: Cardiomegaly.  No active disease. Electronically Signed   By: Rolm Baptise M.D.   On: 06/15/2019 19:10   Ct Head Wo Contrast  Result Date: 06/16/2019 CLINICAL DATA:  Altered mental status EXAM: CT HEAD WITHOUT CONTRAST TECHNIQUE: Contiguous axial images were obtained from the base of the skull through the vertex without intravenous contrast. COMPARISON:  CT head dated 09/30/2014 FINDINGS: Brain: No evidence of acute infarction, hemorrhage, hydrocephalus, extra-axial  collection or mass lesion/mass effect. Atrophy and chronic microvascular ischemic changes are noted. Vascular: No hyperdense vessel or unexpected  calcification. Skull: Normal. Negative for fracture or focal lesion. Sinuses/Orbits: No acute finding. Other: None. IMPRESSION: No acute intracranial abnormality Electronically Signed   By: Constance Holster M.D.   On: 06/16/2019 03:17   US Renal  Result Date: 06/16/2019 CLINICAL DATA:  63 year old female with a history of acute kidney injury EXAM: RENAL / URINARY TRACT ULTRASOUND COMPLETE COMPARISON:  None. FINDINGS: Right Kidney: Length: 11.0 cm x 4.3 cm x 4.7 cm, 117 cc. Echogenicity within normal limits. No mass or hydronephrosis visualized. Left Kidney: Length: 10.5 cm x 6.3 cm x 6.4 cm, 221 cc. Echogenicity within normal limits. No mass or hydronephrosis visualized. Bladder: Bladder partially distended.  Bladder jets not visualized. Additional: Gallbladder demonstrates circumferential gallbladder wall thickening of at least 1 cm. Echogenic material within the gallbladder lumen. Sonographic Murphy's sign not assessed given patient mental status. IMPRESSION: Negative for hydronephrosis. **An incidental finding of potential clinical significance has been found. Circumferential gallbladder wall thickening with potential sludge/stones, may represent incidental finding of cholecystitis. If there is concern for ductal obstruction and acute cholecystitis, recommend correlation with patient presentation, lab values and potentially HIDA study.** Electronically Signed   By: Corrie Mckusick D.O.   On: 06/16/2019 09:54    Pending Labs Unresulted Labs (From admission, onward)    Start     Ordered   06/17/19 0500  Protime-INR  Daily,   R     06/16/19 0655   06/17/19 [id number]  Basic metabolic panel  Daily,   STAT     06/16/19 0751   06/16/19 1412  Blood gas, arterial  ONCE - STAT,   R     06/16/19 1424   06/15/19 1748  Urinalysis, Routine w reflex microscopic  Once,   STAT     06/15/19 1748          Vitals/Pain Today's Vitals   06/16/19 1230 06/16/19 1300 06/16/19 1330 06/16/19 1400  BP: (!) 198/98 (!)  169/69 (!) 164/68 (!) 175/75  Pulse: 73 (!) 57 (!) 54 (!) 54  Resp:      Temp:      TempSrc:      SpO2: 100% 98% 98% 98%  PainSc:        Isolation Precautions No active isolations  Medications Medications  sodium chloride flush (NS) 0.9 % injection 3 mL (has no administration in time range)  0.9 %  sodium chloride infusion ( Intravenous Rate/Dose Change 06/16/19 0956)  dextrose 5 %-0.45 % sodium chloride infusion ( Intravenous Stopped 06/16/19 0633)  hydrALAZINE (APRESOLINE) injection 10 mg (10 mg Intravenous Given 06/16/19 1028)  warfarin (COUMADIN) tablet 10 mg (has no administration in time range)  Warfarin - Pharmacist Dosing Inpatient (has no administration in time range)  ticagrelor (BRILINTA) tablet 90 mg (has no administration in time range)  potassium chloride (KLOR-CON) CR tablet 10 mEq (has no administration in time range)  nitroGLYCERIN (NITROSTAT) SL tablet 0.4 mg (has no administration in time range)  isosorbide dinitrate (ISORDIL) tablet 20 mg (20 mg Oral Not Given 06/16/19 1345)  hydrALAZINE (APRESOLINE) tablet 100 mg (100 mg Oral Not Given 06/16/19 1345)  ferrous sulfate tablet 325 mg (has no administration in time range)  carvedilol (COREG) tablet 25 mg (0 mg Oral Hold 06/16/19 1056)  atorvastatin (LIPITOR) tablet 40 mg (has no administration in time range)  insulin aspart (novoLOG) injection 0-5 Units (has no administration in time range)  insulin glargine (LANTUS) injection 4 Units (4 Units Subcutaneous Given 06/16/19 1404)  labetalol (NORMODYNE) injection 10 mg (has no administration in time range)  insulin aspart (novoLOG) injection 0-9 Units (2 Units Subcutaneous Given 06/16/19 1422)  carvedilol (COREG) tablet 25 mg (25 mg Oral Given 06/15/19 2328)  hydrALAZINE (APRESOLINE) tablet 100 mg (100 mg Oral Given 06/15/19 2328)  sodium chloride 0.9 % bolus 1,000 mL (0 mLs Intravenous Stopped 06/16/19 0633)  potassium chloride 10 mEq in 100 mL IVPB (0 mEq Intravenous Stopped  06/16/19 [id number])    Mobility non-ambulatory High fall risk   Focused Assessments    R Recommendations: See Admitting Provider Note  Report given to:   Additional Notes:

## 2019-06-16 NOTE — H&P (Signed)
History and Physical    Amber Young [id number] DOB: [birth year] DOA: 06/15/2019  PCP: Azzie Glatter, FNP Patient coming from: Home  Chief Complaint: Generalized weakness  HPI: Amber Young is a 62 y.o. female with medical history significant of anemia, chronic combined systolic and diastolic congestive heart failure with EF 25-30% on echo done 01/2019, paroxysmal atrial fibrillation on Coumadin, CKD stage IV, CAD status post PCI, hypertension, hyperlipidemia, CVA, non-insulin-dependent diabetes mellitus, GERD presenting to the ED with generalized weakness.  Patient is not able to contribute to the history.  She is oriented to self only.  Replying to every question by saying "no."  History provided by daughter at bedside.  Daughter states since Monday patient has been very lethargic and not eating.  She has not taken any of her medications since then.  She has not had any fevers, cough, shortness of breath, or complained of chest pain or any other pain.  States up until yesterday patient's mental status was okay but today she has been more confused.  She has not had any falls or head injury.  She takes Metformin for diabetes but has not taken it since Monday.  She was previously on glipizide but daughter states it was stopped during another hospitalization.  Daughter checks her blood glucose daily but has not checked it since Monday because the patient has not been eating.  ED Course: Slightly tachycardic and tachypneic.  Hypertensive with systolic up to [id number] and diastolic up to 0000000.  Not hypoxic.  Afebrile and no leukocytosis.  Hemoglobin not significantly changed from baseline.  Blood glucose 441.  Bicarb 15, anion gap 19.  Urinalysis pending.  VBG with pH 7.30.  BUN 30, creatinine 4.6.  Creatinine ranging between 2.5-3.6 in June 2020 and was 4.9 on 9/15.  High-sensitivity troponin 70.  EKG without acute ischemic changes.  SARS-CoV-2 test pending.  Chest x-ray showing cardiomegaly and no  active disease. Patient received doses of her home Coreg and hydralazine in the ED.  Started on insulin infusion and 1 L normal saline bolus given.  Review of Systems:  All systems reviewed and apart from history of presenting illness, are negative.  Past Medical History:  Diagnosis Date   Anemia    Chronic combined systolic and diastolic CHF (congestive heart failure) ([place])    a. EF 40-45% in 2019 b. EF 25-30% by repeat echo in 01/2019   CKD (chronic kidney disease), stage IV (HCC)    Coronary artery disease 08/2017   DES x 2 RCA   GERD (gastroesophageal reflux disease)    Heart murmur    Hyperlipidemia    Hypertension    Proliferative diabetic retinopathy ([place])    left eye with vitreous hemorrhage and tractional retinal detachment   Stroke ([place]) 09/2014   numbness left upper lip, finger tips on left hand; "resolved" (05/18/2016)   Type II diabetes mellitus ([place])    Wears glasses     Past Surgical History:  Procedure Laterality Date   CARDIAC CATHETERIZATION N/A 05/18/2016   Procedure: Left Heart Cath and Coronary Angiography;  Surgeon: Jettie Booze, MD;  Location: [facility];  Service: Cardiovascular;  Laterality: N/A;   CARDIAC CATHETERIZATION N/A 05/18/2016   Procedure: Coronary Stent Intervention;  Surgeon: Jettie Booze, MD;  Location: [facility];  Service: Cardiovascular;  Laterality: N/A;   COLONOSCOPY W/ BIOPSIES AND POLYPECTOMY     CORONARY ANGIOPLASTY     CORONARY ANGIOPLASTY WITH STENT PLACEMENT  CORONARY STENT INTERVENTION N/A 09/07/2017   Procedure: CORONARY STENT INTERVENTION;  Surgeon: Leonie Man, MD;  Location: [facility];  Service: Cardiovascular;  Laterality: N/A;   EYE SURGERY     GAS INSERTION Left 01/13/2018   Procedure: INSERTION OF GAS;  Surgeon: Bernarda Caffey, MD;  Location: [facility];  Service: Ophthalmology;  Laterality: Left;   LEFT HEART CATH AND CORONARY ANGIOGRAPHY N/A 09/07/2017   Procedure:  LEFT HEART CATH AND CORONARY ANGIOGRAPHY;  Surgeon: Leonie Man, MD;  Location: [facility];  Service: Cardiovascular;  Laterality: N/A;   MEMBRANE PEEL Left 01/13/2018   Procedure: MEMBRANE PEEL LEFT EYE ;  Surgeon: Bernarda Caffey, MD;  Location: [facility];  Service: Ophthalmology;  Laterality: Left;   MULTIPLE TOOTH EXTRACTIONS     PARS PLANA VITRECTOMY Left 01/13/2018   Procedure: PARS PLANA VITRECTOMY WITH 25 GAUGE LEFT EYE WITH ENDOLASER;  Surgeon: Bernarda Caffey, MD;  Location: [facility];  Service: Ophthalmology;  Laterality: Left;   [place]   "partial; fibroids"     reports that she has never smoked. She has never used smokeless tobacco. She reports that she does not drink alcohol or use drugs.  Allergies  Allergen Reactions   Ace Inhibitors Cough    Family History  Problem Relation Age of Onset   Diabetes Mother    Hypertension Mother    COPD Mother    Heart failure Mother    Hypertension Sister    Hypertension Brother     Prior to Admission medications   Medication Sig Start Date End Date Taking? Authorizing Provider  acetaminophen (TYLENOL) 500 MG tablet Take 1,000 mg by mouth every 6 (six) hours as needed for mild pain or headache.    [provider]  atorvastatin (LIPITOR) 40 MG tablet Take 1 tablet (40 mg total) by mouth daily at 6 PM. 05/16/19   Azzie Glatter, FNP  carvedilol (COREG) 25 MG tablet Take 1 tablet (25 mg total) by mouth 2 (two) times daily. 05/16/19   Azzie Glatter, FNP  ferrous sulfate (FERROUSUL) 325 (65 FE) MG tablet Take 1 tablet (325 mg total) by mouth every other day. 05/16/19   Azzie Glatter, FNP  furosemide (LASIX) 80 MG tablet Take 1 tablet (80 mg total) by mouth 2 (two) times daily. TAKE 1 tablet 2 times a day by mouth 05/16/19   Azzie Glatter, FNP  glimepiride (AMARYL) 1 MG tablet Take 1 tablet (1 mg total) by mouth daily with breakfast. 05/16/19   Azzie Glatter, FNP    glipiZIDE (GLUCOTROL) 10 MG tablet Take 1 tablet (10 mg total) by mouth 2 (two) times daily before a meal. 05/16/19   Azzie Glatter, FNP  hydrALAZINE (APRESOLINE) 100 MG tablet Take 1 tablet (100 mg total) by mouth 3 (three) times daily. 05/16/19   Azzie Glatter, FNP  isosorbide dinitrate (ISORDIL) 20 MG tablet Take 1 tablet (20 mg total) by mouth 3 (three) times daily. 05/16/19   Azzie Glatter, FNP  losartan (COZAAR) 100 MG tablet Take 1 tablet (100 mg total) by mouth daily. 05/16/19   Azzie Glatter, FNP  metFORMIN (GLUCOPHAGE) 500 MG tablet Take 1 tablet (500 mg total) by mouth 2 (two) times daily with a meal. 05/16/19   Azzie Glatter, FNP  metolazone (ZAROXOLYN) 2.5 MG tablet Take 1 tablet (2.5 mg total) by mouth 3 (three) times a week. 05/17/19  Azzie Glatter, FNP  nitroGLYCERIN (NITROSTAT) 0.4 MG SL tablet Place 1 tablet (0.4 mg total) under the tongue every 5 (five) minutes x 3 doses as needed for chest pain. 05/16/19   Azzie Glatter, FNP  potassium chloride (KLOR-CON) 10 MEQ tablet Take 1 tablet (10 mEq total) by mouth 2 (two) times daily. Take 1 tablet with each dose of furosemide, daily. 05/16/19   Azzie Glatter, FNP  ticagrelor (BRILINTA) 90 MG TABS tablet Take 1 tablet (90 mg total) by mouth 2 (two) times daily. 05/16/19   Azzie Glatter, FNP  TRUEPLUS PEN NEEDLES 31G X 6 MM MISC USE AS DIRECTED 06/02/18   Azzie Glatter, FNP  warfarin (COUMADIN) 5 MG tablet Take 5mg  daily except for 7.5mg  every Monday, Wednesday and Friday 05/26/19   Herminio Commons, MD    Physical Exam: Vitals:   06/16/19 0045 06/16/19 0100 06/16/19 0200 06/16/19 0230  BP:   (!) 188/99 (!) 170/110  Pulse: (!) 104 99 78 80  Resp: 20 (!) 24 (!) 28 (!) 25  Temp:      TempSrc:      SpO2: 100% 99% 100% 90%    Physical Exam  Constitutional: She appears well-developed and well-nourished.  HENT:  Head: Normocephalic.  Dry mucous membranes  Eyes:  Pupillary reaction could not be  assessed due to lack of patient cooperation  Neck: Neck supple.  Cardiovascular: Normal rate, regular rhythm and intact distal pulses.  Pulmonary/Chest: Effort normal and breath sounds normal. No respiratory distress. She has no wheezes. She has no rales.  Abdominal: Soft. Bowel sounds are normal. She exhibits no distension. There is no abdominal tenderness. There is no guarding.  Musculoskeletal:        General: No edema.  Neurological:  Somnolent but waking up intermittently moving all extremities spontaneously  Skin: Skin is warm and dry.     Labs on Admission: I have personally reviewed following labs and imaging studies  CBC: Recent Labs  Lab 06/15/19 1748 06/16/19 0027  WBC 6.2  --   HGB 10.0* 10.9*  HCT 32.6* 32.0*  MCV 80.7  --   PLT 306  --    Basic Metabolic Panel: Recent Labs  Lab 06/15/19 1748 06/16/19 0027  NA 136 139  K 3.7 3.6  CL 102  --   CO2 15*  --   GLUCOSE 441*  --   BUN 50*  --   CREATININE 4.63*  --   CALCIUM 9.0  --    GFR: CrCl cannot be calculated (Unknown ideal weight.). Liver Function Tests: No results for input(s): AST, ALT, ALKPHOS, BILITOT, PROT, ALBUMIN in the last 168 hours. No results for input(s): LIPASE, AMYLASE in the last 168 hours. No results for input(s): AMMONIA in the last 168 hours. Coagulation Profile: Recent Labs  Lab 06/09/19 0905 06/15/19 1748  INR 1.3* 1.3*   Cardiac Enzymes: No results for input(s): CKTOTAL, CKMB, CKMBINDEX, TROPONINI in the last 168 hours. BNP (last 3 results) No results for input(s): PROBNP in the last 8760 hours. HbA1C: No results for input(s): HGBA1C in the last 72 hours. CBG: Recent Labs  Lab 06/15/19 1746 06/15/19 2351 06/16/19 0112  GLUCAP 412* 383* 325*   Lipid Profile: No results for input(s): CHOL, HDL, LDLCALC, TRIG, CHOLHDL, LDLDIRECT in the last 72 hours. Thyroid Function Tests: No results for input(s): TSH, T4TOTAL, FREET4, T3FREE, THYROIDAB in the last 72  hours. Anemia Panel: No results for input(s): VITAMINB12, FOLATE, FERRITIN, TIBC, IRON,  RETICCTPCT in the last 72 hours. Urine analysis:    Component Value Date/Time   COLORURINE YELLOW 07/21/2018 1303   APPEARANCEUR HAZY (A) 07/21/2018 1303   LABSPEC 1.013 07/21/2018 1303   PHURINE 6.0 07/21/2018 1303   GLUCOSEU 150 (A) 07/21/2018 1303   HGBUR SMALL (A) 07/21/2018 1303   BILIRUBINUR Negative 05/16/2019 1114   KETONESUR negative 01/23/2019 [place] 07/21/2018 1303   PROTEINUR Positive (A) 05/16/2019 1114   PROTEINUR >=300 (A) 07/21/2018 1303   UROBILINOGEN 0.2 05/16/2019 1114   UROBILINOGEN 0.2 10/25/2017 0910   NITRITE Negative 05/16/2019 1114   NITRITE NEGATIVE 07/21/2018 1303   LEUKOCYTESUR Negative 05/16/2019 1114    Radiological Exams on Admission: Dg Chest 2 View  Result Date: 06/15/2019 CLINICAL DATA:  Weakness, loss of appetite EXAM: CHEST - 2 VIEW COMPARISON:  01/23/2019 FINDINGS: Cardiomegaly. No confluent opacities, effusions or edema. No acute bony abnormality. Old healed proximal left humeral fracture. IMPRESSION: Cardiomegaly.  No active disease. Electronically Signed   By: Rolm Baptise M.D.   On: 06/15/2019 19:10    EKG: Independently reviewed.  Sinus rhythm.  Slight ST depression in inferior and lateral leads similar to prior tracing from June 2020.  Assessment/Plan Principal Problem:   DKA (diabetic ketoacidoses) (HCC) Active Problems:   CKD (chronic kidney disease), stage IV (HCC)   Hypertensive urgency   AKI (acute kidney injury) ([place])   Acute metabolic encephalopathy   Moderate DKA Slightly tachycardic and tachypneic on arrival, and now improved after receiving IV fluids. Blood glucose 441.  Bicarb 15, anion gap 19.  VBG with pH 7.30.  Patient has not taken Metformin for the past 4 days.  She is not on insulin at home.  Infection less likely to be a precipitating factor given no fever or leukocytosis. -Admit to progressive care  unit -Insulin infusion per DKA protocol -Received 1 L IV fluid bolus in the ED.  Does have CHF with EF 25 to 30%.  Continue IV fluid hydration with normal saline at 150 cc/h.  When CBG less than 250, switch to D5-1 half normal saline at 75 cc/h. -CV checks every 1 hour -BMP every 4 hours -Potassium 3.7.  20 mEq IV potassium ordered. -Initiate diet and subcutaneous insulin when DKA resolves.  Continue IV insulin for an additional 1 to 2 hours after initiation of diet and subcutaneous insulin. -Check A1c -UA pending to check for ketones  AKI on CKD stage IV Likely prerenal due to severe dehydration from DKA.  BUN 30, creatinine 4.6.  Creatinine ranging between 2.5-3.6 in June 2020 and was 4.9 on 9/15. -IV fluid hydration -Monitor urine output -Monitor renal function.  If no improvement, order renal ultrasound for further evaluation.  Hypertensive urgency Systolic up to [id number] and diastolic up to 0000000.  Patient has not taken her home antihypertensives for the past 4 days. Received doses of her home oral meds including Coreg and hydralazine in the ED.  Blood pressure continues to be elevated. -IV hydralazine PRN -Monitor blood pressure closely  Elevated troponin, history of CAD status post PCI Patient is not complaining of chest pain and appears comfortable.  High-sensitivity troponin 70.  EKG without acute ischemic changes.  Suspect mild troponin elevation is due to demand ischemia from DKA. -Cardiac monitoring -Trend troponin  Acute metabolic encephalopathy Daughter is concerned about acute onset of confusion today.  Patient is currently oriented to self only and not answering questions.  Suspect this is related to DKA.  No focal motor  weakness noted on exam.  Blood pressure significantly elevated. -Stat Head CT -Management of DKA as above  SARS-CoV-2 test pending.  Chest x-ray showing cardiomegaly and no active disease. Patient received doses of her home Coreg and hydralazine in the ED.   Started on insulin infusion and 1 L normal saline bolus given.  Paroxysmal atrial fibrillation on Coumadin Currently in sinus rhythm. -Pharmacy consult for Coumadin if head CT negative for bleed.  Pharmacy med rec pending.  DVT prophylaxis: Coumadin Code Status: Full code Family Communication: Daughter at bedside. Disposition Plan: Anticipate discharge after clinical improvement. Consults called: None Admission status: It is my clinical opinion that referral for OBSERVATION is reasonable and necessary in this patient based on the above information provided. The aforementioned taken together are felt to place the patient at high risk for further clinical deterioration. However it is anticipated that the patient may be medically stable for discharge from the hospital within 24 to 48 hours.  The medical decision making on this patient was of high complexity and the patient is at high risk for clinical deterioration, therefore this is a level 3 visit.  Shela Leff MD [facility] Pager [phone]  If 7PM-7AM, please contact night-coverage [email or web address] Password [email or web address]  06/16/2019, 2:36 AM

## 2019-06-16 NOTE — Progress Notes (Signed)
Tech attempted to do pt. Pt was unavailable and gone for imaging in Radiology.

## 2019-06-16 NOTE — ED Notes (Signed)
Pt is sinus brady on monitor 

## 2019-06-16 NOTE — ED Notes (Signed)
PAGED ADMITTING PER RN  

## 2019-06-16 NOTE — ED Notes (Signed)
Report given to Ambrose Pancoast on 5W

## 2019-06-16 NOTE — Progress Notes (Signed)
ANTICOAGULATION CONSULT NOTE - Initial Consult  Pharmacy Consult for Warfarin  Indication: atrial fibrillation  Allergies  Allergen Reactions  . Ace Inhibitors Cough     Vital Signs: BP: 170/90 (11/06 0600) Pulse Rate: 55 (11/06 0530)  Labs: Recent Labs    06/15/19 0009 06/15/19 1748 06/16/19 0027 06/16/19 0323  HGB  --  10.0* 10.9*  --   HCT  --  32.6* 32.0*  --   PLT  --  306  --   --   LABPROT  --  16.4*  --   --   INR  --  1.3*  --   --   CREATININE  --  4.63*  --  4.35*  TROPONINIHS 76* 70*  --  86*    CrCl cannot be calculated (Unknown ideal weight.).   Medical History: Past Medical History:  Diagnosis Date  . Anemia   . Chronic combined systolic and diastolic CHF (congestive heart failure) ([place])    a. EF 40-45% in 2019 b. EF 25-30% by repeat echo in 01/2019  . CKD (chronic kidney disease), stage IV ([place])   . Coronary artery disease 08/2017   DES x 2 RCA  . GERD (gastroesophageal reflux disease)   . Heart murmur   . Hyperlipidemia   . Hypertension   . Proliferative diabetic retinopathy ([place])    left eye with vitreous hemorrhage and tractional retinal detachment  . Stroke ([place]) 09/2014   numbness left upper lip, finger tips on left hand; "resolved" (05/18/2016)  . Type II diabetes mellitus ([place])   . Wears glasses     Assessment: 63 y/o F presents to the ED with weakness/DKA, on warfarin PTA for afib, INR is sub-therapeutic at 1.3, Hgb 10.9  Goal of Therapy:  INR 2-3 Monitor platelets by anticoagulation protocol: Yes   Plan:  Warfarin 10 mg PO x 1 this evening at 1800 Daily PT/INR Monitor for bleeding  Narda Bonds, PharmD, BCPS Clinical Pharmacist Phone: [phone]

## 2019-06-16 NOTE — Consult Note (Addendum)
NEURO HOSPITALIST CONSULT NOTE   Requesting physician: Dr. Lupita Leash  Reason for Consult:AMS  History obtained from:  Chart review  HPI:                                                                                                                                          Amber Young is an 63 y.o. female with a PMHx of CKD4, DM2, paroxysmal atrial fibrillation (on Coumadin), CVA (2016), diabetic retinopathy, CAD, HTN, HLD, combined diastolic and systolic CHF with recent echocardiogram revealing an EF of 25-30%, who presented to [facility] ED with c/o AMS and lethargy. She was diagnosed with DKA and is currently on an insulin drip. Blood glucose was 441 on admission.  Per chart review patient complained to daughter on Tuesday that she felt tired and did not want to do much. Daughter felt that patient was very lethargic since Monday and also had not been eating or taking her medications. Patient was sleeping more during the day and at night. She refused to taker her medications or eat, but would drink small amounts. Denies fever, cough, CP, SOB. She has not taken her metformin for 4 days, which is suspected to be the etiology for her DKA; no white count or fever to suggest infection as a precipitant. Of note, she was hypertensive with SBP up to 210s and DBP up to 140s. Her Cr was elevated this admission relative to her baseline.   The patient is unable to provide a ROS due to somnolence and lethargy. Per chart review, she has not had cough, SOB or fever.    11/5 BG: 441 BUN: 30 creatinine 4.6  Insulin drip initiated  11/6 Neurology consulted for continued AMS Insulin drip off BG: 100's CTH: no acute abnormality B12: 4278,  TSH: WNL, ammonia: 37 K: 3.4 BUN: 48 creatinine 4.38 BP: 175/75  Past Medical History:  Diagnosis Date  . Anemia   . Chronic combined systolic and diastolic CHF (congestive heart failure) ([place])    a. EF 40-45% in 2019 b. EF 25-30% by repeat echo in  01/2019  . CKD (chronic kidney disease), stage IV ([place])   . Coronary artery disease 08/2017   DES x 2 RCA  . GERD (gastroesophageal reflux disease)   . Heart murmur   . Hyperlipidemia   . Hypertension   . Proliferative diabetic retinopathy ([place])    left eye with vitreous hemorrhage and tractional retinal detachment  . Stroke ([place]) 09/2014   numbness left upper lip, finger tips on left hand; "resolved" (05/18/2016)  . Type II diabetes mellitus ([place])   . Wears glasses     Past Surgical History:  Procedure Laterality Date  . CARDIAC CATHETERIZATION N/A 05/18/2016   Procedure: Left Heart Cath and Coronary Angiography;  Surgeon: Charlann Lange  Irish Lack, MD;  Location: [place] CV LAB;  Service: Cardiovascular;  Laterality: N/A;  . CARDIAC CATHETERIZATION N/A 05/18/2016   Procedure: Coronary Stent Intervention;  Surgeon: Jettie Booze, MD;  Location: [place] CV LAB;  Service: Cardiovascular;  Laterality: N/A;  . COLONOSCOPY W/ BIOPSIES AND POLYPECTOMY    . CORONARY ANGIOPLASTY    . CORONARY ANGIOPLASTY WITH STENT PLACEMENT    . CORONARY STENT INTERVENTION N/A 09/07/2017   Procedure: CORONARY STENT INTERVENTION;  Surgeon: Leonie Man, MD;  Location: [place] CV LAB;  Service: Cardiovascular;  Laterality: N/A;  . EYE SURGERY    . GAS INSERTION Left 01/13/2018   Procedure: INSERTION OF GAS;  Surgeon: Bernarda Caffey, MD;  Location: [place];  Service: Ophthalmology;  Laterality: Left;  . LEFT HEART CATH AND CORONARY ANGIOGRAPHY N/A 09/07/2017   Procedure: LEFT HEART CATH AND CORONARY ANGIOGRAPHY;  Surgeon: Leonie Man, MD;  Location: [place] CV LAB;  Service: Cardiovascular;  Laterality: N/A;  . MEMBRANE PEEL Left 01/13/2018   Procedure: MEMBRANE PEEL LEFT EYE ;  Surgeon: Bernarda Caffey, MD;  Location: [place];  Service: Ophthalmology;  Laterality: Left;  Marland Kitchen MULTIPLE TOOTH EXTRACTIONS    . PARS PLANA VITRECTOMY Left 01/13/2018   Procedure: PARS PLANA VITRECTOMY WITH 25 GAUGE LEFT EYE  WITH ENDOLASER;  Surgeon: Bernarda Caffey, MD;  Location: [place];  Service: Ophthalmology;  Laterality: Left;  . TUBAL LIGATION  1980  . VAGINAL HYSTERECTOMY  1999   "partial; fibroids"    Family History  Problem Relation Age of Onset  . Diabetes Mother   . Hypertension Mother   . COPD Mother   . Heart failure Mother   . Hypertension Sister   . Hypertension Brother           Social History:  reports that she has never smoked. She has never used smokeless tobacco. She reports that she does not drink alcohol or use drugs.  Allergies  Allergen Reactions  . Ace Inhibitors Cough    MEDICATIONS:                                                                                                                     Current Facility-Administered Medications  Medication Dose Route Frequency Provider Last Rate Last Dose  . 0.9 %  sodium chloride infusion   Intravenous Continuous Antonieta Pert, MD 75 mL/hr at 06/16/19 0956    . atorvastatin (LIPITOR) tablet 40 mg  40 mg Oral q1800 Kc, Ramesh, MD      . carvedilol (COREG) tablet 25 mg  25 mg Oral BID WC Antonieta Pert, MD   Stopped at 06/16/19 1056  . dextrose 5 %-0.45 % sodium chloride infusion   Intravenous Continuous Shela Leff, MD   Stopped at 06/16/19 [phone]  . ferrous sulfate tablet 325 mg  325 mg Oral QODAY Kc, Ramesh, MD      . hydrALAZINE (APRESOLINE) injection 10 mg  10 mg Intravenous Q4H PRN Shela Leff, MD   10  mg at 06/16/19 1028  . hydrALAZINE (APRESOLINE) tablet 100 mg  100 mg Oral TID Kc, Ramesh, MD      . insulin aspart (novoLOG) injection 0-5 Units  0-5 Units Subcutaneous QHS Kc, Ramesh, MD      . insulin aspart (novoLOG) injection 0-9 Units  0-9 Units Subcutaneous Q6H Antonieta Pert, MD   2 Units at 06/16/19 1422  . insulin glargine (LANTUS) injection 4 Units  4 Units Subcutaneous Daily Antonieta Pert, MD   4 Units at 06/16/19 1404  . isosorbide dinitrate (ISORDIL) tablet 20 mg  20 mg Oral TID Kc, Ramesh, MD      . labetalol  (NORMODYNE) injection 10 mg  10 mg Intravenous Q4H PRN Kc, Ramesh, MD      . nitroGLYCERIN (NITROSTAT) SL tablet 0.4 mg  0.4 mg Sublingual Q5 Min x 3 PRN Kc, Ramesh, MD      . potassium chloride (KLOR-CON) CR tablet 10 mEq  10 mEq Oral BID Kc, Ramesh, MD      . sodium chloride flush (NS) 0.9 % injection 3 mL  3 mL Intravenous Once Shela Leff, MD      . ticagrelor (BRILINTA) tablet 90 mg  90 mg Oral BID Kc, Ramesh, MD      . warfarin (COUMADIN) tablet 10 mg  10 mg Oral ONCE-1800 Erenest Blank, RPH      . Warfarin - Pharmacist Dosing Inpatient   Does not apply q1800 Erenest Blank [facility]       Current Outpatient Medications  Medication Sig Dispense Refill  . acetaminophen (TYLENOL) 500 MG tablet Take 1,000 mg by mouth every 6 (six) hours as needed for mild pain or headache.    Marland Kitchen amLODipine (NORVASC) 10 MG tablet Take 10 mg by mouth daily.    Marland Kitchen atorvastatin (LIPITOR) 40 MG tablet Take 1 tablet (40 mg total) by mouth daily at 6 PM. 90 tablet 1  . brimonidine (ALPHAGAN) 0.2 % ophthalmic solution Place 1 drop into the left eye 2 (two) times daily.    . carvedilol (COREG) 25 MG tablet Take 1 tablet (25 mg total) by mouth 2 (two) times daily. 60 tablet 6  . ferrous sulfate (FERROUSUL) 325 (65 FE) MG tablet Take 1 tablet (325 mg total) by mouth every other day. 15 tablet 6  . furosemide (LASIX) 80 MG tablet Take 1 tablet (80 mg total) by mouth 2 (two) times daily. TAKE 1 tablet 2 times a day by mouth (Patient taking differently: Take 80 mg by mouth 2 (two) times daily. ) 30 tablet 6  . glimepiride (AMARYL) 1 MG tablet Take 1 tablet (1 mg total) by mouth daily with breakfast. 30 tablet 6  . glipiZIDE (GLUCOTROL) 10 MG tablet Take 1 tablet (10 mg total) by mouth 2 (two) times daily before a meal. 60 tablet 6  . hydrALAZINE (APRESOLINE) 100 MG tablet Take 1 tablet (100 mg total) by mouth 3 (three) times daily. 90 tablet 6  . hydrochlorothiazide (HYDRODIURIL) 25 MG tablet Take 25 mg by mouth daily.     . isosorbide dinitrate (ISORDIL) 20 MG tablet Take 1 tablet (20 mg total) by mouth 3 (three) times daily. 90 tablet 6  . ketorolac (ACULAR) 0.4 % SOLN Place 1 drop into the right eye 4 (four) times daily.    Marland Kitchen losartan (COZAAR) 100 MG tablet Take 1 tablet (100 mg total) by mouth daily. 30 tablet 6  . metFORMIN (GLUCOPHAGE) 500 MG tablet Take 1 tablet (500 mg total)  by mouth 2 (two) times daily with a meal. 60 tablet 6  . metolazone (ZAROXOLYN) 2.5 MG tablet Take 1 tablet (2.5 mg total) by mouth 3 (three) times a week. 30 tablet 6  . nitroGLYCERIN (NITROSTAT) 0.4 MG SL tablet Place 1 tablet (0.4 mg total) under the tongue every 5 (five) minutes x 3 doses as needed for chest pain. 30 tablet 12  . ofloxacin (OCUFLOX) 0.3 % ophthalmic solution Place 1 drop into the right eye 3 (three) times daily.    . potassium chloride (KLOR-CON) 10 MEQ tablet Take 1 tablet (10 mEq total) by mouth 2 (two) times daily. Take 1 tablet with each dose of furosemide, daily. 60 tablet 6  . ticagrelor (BRILINTA) 90 MG TABS tablet Take 1 tablet (90 mg total) by mouth 2 (two) times daily. 180 tablet 1  . warfarin (COUMADIN) 5 MG tablet Take 5mg  daily except for 7.5mg  every Monday, Wednesday and Friday 45 tablet 0  . dorzolamide-timolol (COSOPT) 22.3-6.8 MG/ML ophthalmic solution Place 1 drop into the left eye 2 (two) times daily.    . TRUEPLUS PEN NEEDLES 31G X 6 MM MISC USE AS DIRECTED 100 each 0      ROS:                                                                                                                                        unobtainable from patient due to mental status   Blood pressure (!) 175/75, pulse (!) 54, temperature 97.8 F (36.6 C), temperature source Oral, resp. rate 18, SpO2 98 %.   General Examination:                                                                                                       Physical Exam  Constitutional: Appears well-developed and well-nourished.  HENT:  Normocephalic, no lesions, without obvious abnormality. No nuchal rigidity.  Musculoskeletal-no joint tenderness, deformity or swelling Cardiovascular: Normal rate and regular rhythm.  Respiratory: Effort normal, non-labored breathing saturations WNL GI: Soft.  No distension. There is no tenderness.  Skin: WDI  Neurological Examination Mental Status: Lethargic, responds to voice, but does not open eyes. Was able to follow 1 or 2 simple commands but mostly repeated "okay", but would not actually complete the action. On follow up exam by attending, she did open eyes partially after sustained auditory and light noxious stimuli and could count 5 fingers. She also briefly tracked a moving object with saccadic visual pursuits. Brief  verbal exclamations to noxious stimuli were grammatical, but she was not able to answer orientation questions and continued to be somnolent.  Cranial Nerves: Patient actively resists eye opening. Face is symmetric. Eyes are conjugate. PERRL. No hoarseness with brief verbal responses. No facial twitching noted.  Motor/Sensory: Patient able to move all 4 extremities. No focal deficits noted. Was able to grimace and withdraw BLE. Patient only grimaced and stated "ow", but did not withdraw BUE. She resisted passive elevation of upper extremities, but briefly gripped examiner's hand bilaterally to command. No asymmetry noted.   Deep Tendon Reflexes: 2+ biceps and patella Plantars: Right: downgoing   Left: downgoing Cerebellar: UTA Gait: UTA   Lab Results: Basic Metabolic Panel: Recent Labs  Lab 06/15/19 1748 06/16/19 0027 06/16/19 0323 06/16/19 0550  NA 136 139 138 139  K 3.7 3.6 2.8* 3.4*  CL 102  --  107 112*  CO2 15*  --  16* 16*  GLUCOSE 441*  --  218* 116*  BUN 50*  --  48* 48*  CREATININE 4.63*  --  4.35* 4.38*  CALCIUM 9.0  --  8.6* 8.8*    CBC: Recent Labs  Lab 06/15/19 1748 06/16/19 0027  WBC 6.2  --   HGB 10.0* 10.9*  HCT 32.6* 32.0*  MCV 80.7   --   PLT 306  --    Imaging: Dg Chest 2 View  Result Date: 06/15/2019 CLINICAL DATA:  Weakness, loss of appetite EXAM: CHEST - 2 VIEW COMPARISON:  01/23/2019 FINDINGS: Cardiomegaly. No confluent opacities, effusions or edema. No acute bony abnormality. Old healed proximal left humeral fracture. IMPRESSION: Cardiomegaly.  No active disease. Electronically Signed   By: Rolm Baptise M.D.   On: 06/15/2019 19:10   Ct Head Wo Contrast  Result Date: 06/16/2019 CLINICAL DATA:  Altered mental status EXAM: CT HEAD WITHOUT CONTRAST TECHNIQUE: Contiguous axial images were obtained from the base of the skull through the vertex without intravenous contrast. COMPARISON:  CT head dated 09/30/2014 FINDINGS: Brain: No evidence of acute infarction, hemorrhage, hydrocephalus, extra-axial collection or mass lesion/mass effect. Atrophy and chronic microvascular ischemic changes are noted. Vascular: No hyperdense vessel or unexpected calcification. Skull: Normal. Negative for fracture or focal lesion. Sinuses/Orbits: No acute finding. Other: None. IMPRESSION: No acute intracranial abnormality Electronically Signed   By: Constance Holster M.D.   On: 06/16/2019 03:17   US Renal  Result Date: 06/16/2019 CLINICAL DATA:  63 year old female with a history of acute kidney injury EXAM: RENAL / URINARY TRACT ULTRASOUND COMPLETE COMPARISON:  None. FINDINGS: Right Kidney: Length: 11.0 cm x 4.3 cm x 4.7 cm, 117 cc. Echogenicity within normal limits. No mass or hydronephrosis visualized. Left Kidney: Length: 10.5 cm x 6.3 cm x 6.4 cm, 221 cc. Echogenicity within normal limits. No mass or hydronephrosis visualized. Bladder: Bladder partially distended.  Bladder jets not visualized. Additional: Gallbladder demonstrates circumferential gallbladder wall thickening of at least 1 cm. Echogenic material within the gallbladder lumen. Sonographic Murphy's sign not assessed given patient mental status. IMPRESSION: Negative for hydronephrosis.  **An incidental finding of potential clinical significance has been found. Circumferential gallbladder wall thickening with potential sludge/stones, may represent incidental finding of cholecystitis. If there is concern for ductal obstruction and acute cholecystitis, recommend correlation with patient presentation, lab values and potentially HIDA study.** Electronically Signed   By: Corrie Mckusick D.O.   On: 06/16/2019 09:54    Assessment: 63 y.o. female with complicated PMHx including atrial fibrillation, CHF, prior stroke, CKD4 and DM who presented  to [facility] with DKA and AMS. Neurology was consulted for continued AMS after the patient was placed on insulin drip.  1. Overall clinical picture is most consistent with AMS due to metabolic derangements in the setting of DKA. Decreased PO intake x 4 days also a likely contributing factor.  2. No jerking, twitching, automatisms or eye deviation on exam to suggest seizure activity. However, will need EEG to further assess.  3. CTH: no acute abnormality   Recommendations: - Correct metabolic derangements per primary team  - MRI brain - EEG in AM   Laurey Morale, MSN, NP-C [facility] [phone]  I have seen and examined the patient. I have formulated the assessment and plan. 63 year old female presenting with DKA and confusion. Exam findings best localize as diffuse bihemispheric dysfunction. No clinical seizure activity appreciated. Will obtain MRI brain and EEG.  Electronically signed: Dr. Kerney Elbe 06/16/2019, 2:30 PM

## 2019-06-16 NOTE — ED Notes (Signed)
Pt is lethargic. Pt arouses to verbal stimuli, but won't stay awake.

## 2019-06-16 NOTE — Discharge Instructions (Signed)

## 2019-06-17 ENCOUNTER — Inpatient Hospital Stay (HOSPITAL_COMMUNITY): Payer: Medicare Other

## 2019-06-17 DIAGNOSIS — G9340 Encephalopathy, unspecified: Secondary | ICD-10-CM

## 2019-06-17 DIAGNOSIS — I4891 Unspecified atrial fibrillation: Secondary | ICD-10-CM

## 2019-06-17 HISTORY — DX: Unspecified atrial fibrillation: I48.91

## 2019-06-17 LAB — GLUCOSE, CAPILLARY
Glucose-Capillary: 137 mg/dL — ABNORMAL HIGH (ref 70–99)
Glucose-Capillary: 148 mg/dL — ABNORMAL HIGH (ref 70–99)
Glucose-Capillary: 159 mg/dL — ABNORMAL HIGH (ref 70–99)
Glucose-Capillary: 110 mg/dL — ABNORMAL HIGH (ref 70–99)
Glucose-Capillary: 188 mg/dL — ABNORMAL HIGH (ref 70–99)

## 2019-06-17 LAB — BASIC METABOLIC PANEL
Anion gap: 16 — ABNORMAL HIGH (ref 5–15)
BUN: 57 mg/dL — ABNORMAL HIGH (ref 8–23)
CO2: 14 mmol/L — ABNORMAL LOW (ref 22–32)
Calcium: 8.5 mg/dL — ABNORMAL LOW (ref 8.9–10.3)
Chloride: 109 mmol/L (ref 98–111)
Creatinine, Ser: 4.61 mg/dL — ABNORMAL HIGH (ref 0.44–1.00)
GFR calc Af Amer: 11 mL/min — ABNORMAL LOW (ref >60)
GFR calc non Af Amer: 9 mL/min — ABNORMAL LOW (ref >60)
Glucose, Bld: 152 mg/dL — ABNORMAL HIGH (ref 70–99)
Potassium: 3.4 mmol/L — ABNORMAL LOW (ref 3.5–5.1)
Sodium: 139 mmol/L (ref 135–145)

## 2019-06-17 LAB — HEPARIN LEVEL (UNFRACTIONATED): Heparin Unfractionated: 0.47 IU/mL (ref 0.30–0.70)

## 2019-06-17 LAB — APTT: aPTT: 28 seconds (ref 24–36)

## 2019-06-17 LAB — PROTIME-INR
INR: 1.5 — ABNORMAL HIGH (ref 0.8–1.2)
Prothrombin Time: 18.3 seconds — ABNORMAL HIGH (ref 11.4–15.2)

## 2019-06-17 MED ORDER — WARFARIN SODIUM 7.5 MG PO TABS
7.5000 mg | ORAL_TABLET | Freq: Once | ORAL | Status: AC
  Administered 2019-06-17: 7.5 mg via ORAL
  Filled 2019-06-17: qty 1

## 2019-06-17 MED ORDER — INSULIN ASPART 100 UNIT/ML ~~LOC~~ SOLN
0.0000 [IU] | Freq: Three times a day (TID) | SUBCUTANEOUS | Status: DC
  Administered 2019-06-17: 2 [IU] via SUBCUTANEOUS
  Administered 2019-06-18: 1 [IU] via SUBCUTANEOUS
  Administered 2019-06-18 (×2): 2 [IU] via SUBCUTANEOUS

## 2019-06-17 MED ORDER — HEPARIN (PORCINE) 25000 UT/250ML-% IV SOLN
950.0000 [IU]/h | INTRAVENOUS | Status: DC
  Administered 2019-06-17: 1100 [IU]/h via INTRAVENOUS
  Filled 2019-06-17: qty 250

## 2019-06-17 MED ORDER — AMLODIPINE BESYLATE 10 MG PO TABS
10.0000 mg | ORAL_TABLET | Freq: Every day | ORAL | Status: DC
  Administered 2019-06-17 – 2019-06-18 (×2): 10 mg via ORAL
  Filled 2019-06-17 (×2): qty 1

## 2019-06-17 MED ORDER — SODIUM CHLORIDE 0.9 % IV SOLN
INTRAVENOUS | Status: DC
  Administered 2019-06-17 – 2019-06-18 (×2): via INTRAVENOUS

## 2019-06-17 NOTE — Progress Notes (Signed)
Paged Dr. Maylene Roes regarding patient's running sinus brady, low 50's. 1700 Coreg held.  Hiram Comber, RN 06/17/2019 4:27 PM

## 2019-06-17 NOTE — Progress Notes (Signed)
Neurology Progress Note   S:// Seen and examined.  No acute changes overnight    O:// Current vital signs: BP (!) 170/62   Pulse 67   Temp 98.6 F (37 C) (Oral)   Resp 19   SpO2 95%  Vital signs in last 24 hours: Temp:  [97.6 F (36.4 C)-98.6 F (37 C)] 98.6 F (37 C) (11/07 0939) Pulse Rate:  [54-73] 67 (11/06 1815) Resp:  [16-20] 19 (11/07 0939) BP: (135-203)/(58-105) 170/62 (11/07 0939) SpO2:  [93 %-100 %] 95 % (11/07 0939) Neurological exam Mental status: Sleeping, opens eyes to voice, very inattentive.  Keeps falling back asleep. Was able to follow simple commands. Minimal verbal output but was able to tell her name and yes or no inconsistently to some questions. Cranial nerves: Pupils equal round react light, extraocular movements appear intact, face is symmetric. Motor exam: Able to raise all 4 extremities briefly antigravity but unable to sustain. Sensory exam: Intact to touch all over Unable to test cerebellar status  General: Sleeping in bed in no apparent distress HEENT: Normocephalic atraumatic CVS: Regular rate rhythm Respiratory: Breathing normally and saturating well on room air Abdomen: Nondistended nontender   Medications  Current Facility-Administered Medications:  .  0.9 %  sodium chloride infusion, , Intravenous, Continuous, Kc, Ramesh, MD, Last Rate: 50 mL/hr at 06/16/19 1855 .  atorvastatin (LIPITOR) tablet 40 mg, 40 mg, Oral, q1800, Kc, Ramesh, MD, 40 mg at 06/16/19 1836 .  carvedilol (COREG) tablet 25 mg, 25 mg, Oral, BID WC, Kc, Ramesh, MD, 25 mg at 06/17/19 0858 .  dextrose 5 %-0.45 % sodium chloride infusion, , Intravenous, Continuous, Shela Leff, MD, Stopped at 06/16/19 [phone]  ferrous sulfate tablet 325 mg, 325 mg, Oral, QODAY, Kc, Ramesh, MD, 325 mg at 06/17/19 0900 .  hydrALAZINE (APRESOLINE) injection 10 mg, 10 mg, Intravenous, Q4H PRN, Shela Leff, MD, 10 mg at 06/16/19 1028 .  hydrALAZINE (APRESOLINE) tablet 100 mg,  100 mg, Oral, TID, Kc, Ramesh, MD, 100 mg at 06/17/19 0859 .  insulin aspart (novoLOG) injection 0-5 Units, 0-5 Units, Subcutaneous, QHS, Kc, Ramesh, MD .  insulin aspart (novoLOG) injection 0-9 Units, 0-9 Units, Subcutaneous, Q6H, Kc, Ramesh, MD, 1 Units at 06/17/19 0901 .  insulin glargine (LANTUS) injection 4 Units, 4 Units, Subcutaneous, Daily, Kc, Ramesh, MD, 4 Units at 06/17/19 0951 .  isosorbide dinitrate (ISORDIL) tablet 20 mg, 20 mg, Oral, TID, Kc, Ramesh, MD, 20 mg at 06/17/19 0950 .  labetalol (NORMODYNE) injection 10 mg, 10 mg, Intravenous, Q4H PRN, Kc, Ramesh, MD .  nitroGLYCERIN (NITROSTAT) SL tablet 0.4 mg, 0.4 mg, Sublingual, Q5 Min x 3 PRN, Kc, Ramesh, MD .  potassium chloride (KLOR-CON) CR tablet 10 mEq, 10 mEq, Oral, BID, Kc, Ramesh, MD, 10 mEq at 06/17/19 0901 .  sodium chloride flush (NS) 0.9 % injection 3 mL, 3 mL, Intravenous, Once, Shela Leff, MD .  ticagrelor (BRILINTA) tablet 90 mg, 90 mg, Oral, BID, Kc, Ramesh, MD, 90 mg at 06/17/19 0951 .  Warfarin - Pharmacist Dosing Inpatient, , Does not apply, q1800, Erenest Blank [facility] Labs CBC    Component Value Date/Time   WBC 6.2 06/15/2019 1748   RBC 4.04 06/15/2019 1748   HGB 10.9 (L) 06/16/2019 0027   HGB 8.4 (L) 01/24/2018 1048   HCT 32.0 (L) 06/16/2019 0027   HCT 27.2 (L) 01/24/2018 1048   PLT 306 06/15/2019 1748   PLT 356 01/24/2018 1048   MCV 80.7 06/15/2019 1748   MCV 78 (  L) 01/24/2018 1048   MCH 24.8 (L) 06/15/2019 1748   MCHC 30.7 06/15/2019 1748   RDW 16.4 (H) 06/15/2019 1748   RDW 17.6 (H) 01/24/2018 1048   LYMPHSABS 1.4 07/22/2018 0630   LYMPHSABS 1.6 01/24/2018 1048   MONOABS 0.1 07/22/2018 0630   EOSABS 0.0 07/22/2018 0630   EOSABS 0.1 01/24/2018 1048   BASOSABS 0.0 07/22/2018 0630   BASOSABS 0.0 01/24/2018 1048    CMP     Component Value Date/Time   NA 139 06/17/2019 0720   NA 140 04/25/2019 1202   K 3.4 (L) 06/17/2019 0720   CL 109 06/17/2019 0720   CO2 14 (L) 06/17/2019 0720    GLUCOSE 152 (H) 06/17/2019 0720   BUN 57 (H) 06/17/2019 0720   BUN 71 (H) 04/25/2019 1202   CREATININE 4.61 (H) 06/17/2019 0720   CREATININE 1.87 (H) 05/27/2018 1221   CALCIUM 8.5 (L) 06/17/2019 0720   PROT 5.9 (L) 06/16/2019 0926   PROT 6.4 01/24/2018 1048   ALBUMIN 2.6 (L) 06/16/2019 0926   ALBUMIN 3.1 (L) 01/24/2018 1048   AST 250 (H) 06/16/2019 0926   AST 15 12/13/2017 1049   ALT 224 (H) 06/16/2019 0926   ALT 9 12/13/2017 1049   ALKPHOS 109 06/16/2019 0926   BILITOT 1.4 (H) 06/16/2019 0926   BILITOT <0.2 01/24/2018 1048   BILITOT 0.2 12/13/2017 1049   GFRNONAA 9 (L) 06/17/2019 0720   GFRNONAA 31 (L) 12/13/2017 1049   GFRNONAA 43 (L) 07/09/2017 1047   GFRAA 11 (L) 06/17/2019 0720   GFRAA 36 (L) 12/13/2017 1049   GFRAA 50 (L) 07/09/2017 1047  Ammonia level 37-mild elevated [id number]  Imaging I have reviewed images in epic and the results pertinent to this consultation are: CT-scan of the brain-no acute changes   Assessment: 63 year old woman with past medical history of A. fib on Coumadin, CHF, prior stroke, CKD 4 and diabetes presenting with altered mental status since past Sunday, was found to be in diabetic ketoacidosis which has been since corrected but continues to be altered in terms of her mentation. Overall, her clinical picture looks consistent with toxic metabolic encephalopathy in the setting of DKA and decreased p.o. intake along with deranged renal function. No active evidence of automatisms or seizure-like activity or seizures. EEG completed-no evidence of epileptogenicity, only continuous slowing and triphasic waves consistent with toxic metabolic encephalopathy.  Impression: -Multifactorial toxic metabolic encephalopathy, suspect due to uremia, deranged liver function and DKA -DKA -CKD  Recommendations: -Management of toxic metabolic derangements per primary team as you are. -EEG unremarkable -MRI of the brain pending.  Complete the MRI and call  neurology if any acute abnormalities noted.  I will follow the imaging with you. -I suspect that at this time, she is still in the process of recovering from the toxic metabolic insults that she has had over the past few days to week and given multiple system involvements, she might take some time to return to baseline as these changes are corrected. -Continue anticoagulation for A. fib.  If the MRI shows any evidence of stroke, will update my recommendations on continuing or stopping anticoagulation.  -- Amie Portland, MD [facility] Pager: [phone] If 7pm to 7am, please call on call as listed on AMION.

## 2019-06-17 NOTE — Progress Notes (Signed)
Patient is impulsive this AM, trying to get OOB and pulling tubes and tele wires. Unable to follow command. Staff has responded to bed alarm numerous times to prevent falls. Will continue to monitor.

## 2019-06-17 NOTE — Progress Notes (Addendum)
[facility] for Warfarin  Indication: atrial fibrillation  Allergies  Allergen Reactions  . Ace Inhibitors Cough    Vital Signs: Temp: 98.6 F (37 C) (11/07 0939) Temp Source: Oral (11/07 0939) BP: 170/62 (11/07 0939)  Labs: Recent Labs    06/15/19 1748 06/16/19 0027 06/16/19 0323 06/16/19 0550 06/16/19 0926 06/17/19 0720  HGB 10.0* 10.9*  --   --   --   --   HCT 32.6* 32.0*  --   --   --   --   PLT 306  --   --   --   --   --   LABPROT 16.4*  --   --   --   --  18.3*  INR 1.3*  --   --   --   --  1.5*  CREATININE 4.63*  --  4.35* 4.38*  --  4.61*  TROPONINIHS 70*  --  86* 91* 96*  --     CrCl cannot be calculated (Unknown ideal weight.).   Medical History: Past Medical History:  Diagnosis Date  . Anemia   . Chronic combined systolic and diastolic CHF (congestive heart failure) ([place])    a. EF 40-45% in 2019 b. EF 25-30% by repeat echo in 01/2019  . CKD (chronic kidney disease), stage IV ([place])   . Coronary artery disease 08/2017   DES x 2 RCA  . DKA (diabetic ketoacidoses) ([place]) 06/16/2019  . GERD (gastroesophageal reflux disease)   . Heart murmur   . Hyperlipidemia   . Hypertension   . Proliferative diabetic retinopathy ([place])    left eye with vitreous hemorrhage and tractional retinal detachment  . Stroke ([place]) 09/2014   numbness left upper lip, finger tips on left hand; "resolved" (05/18/2016)  . Type II diabetes mellitus ([place])   . Wears glasses     Assessment: 63 y/o F presents to the ED with weakness/DKA, on warfarin PTA for afib. Her home regimen is warfarin 5 mg daily except 7.5 mg every MWF. Last dose prior to admission 06/12/2019.   INR today is subtherapeutic at 1.5. No new CBC. No bleeding noted. Chadsvasc score = 7. Patient is at higher risk of stroke and currently not properly anticoagulated yet on warfarin.   Goal of Therapy:  INR 2-3 Monitor platelets by anticoagulation protocol: Yes   Plan:  Warfarin  7.5 mg PO x 1 this evening at 1800 Consider heparin gtt bridge to warfarin if plans are to continue anticoagulation Daily PT/INR Monitor for s/sx of bleeding  ADDENDUM: Pharmacy consulted for heparin gtt for bridge to warfarin. Will start 1100 units/hr and check a heparin level in 8 hours. Will not bolus due to neurology ruling out CVA. Will keep conservative heparin level goal of 0.3-0.5 until neurology updates recommendations. Otherwise, continue heparin until INR >2.  Monitor HL and CBC daily in addition to parameters listed above.   Agnes Lawrence, PharmD PGY1 Pharmacy Resident

## 2019-06-17 NOTE — Progress Notes (Signed)
[facility] for Heparin Indication: atrial fibrillation, possible ischemic changes  Allergies  Allergen Reactions  . Ace Inhibitors Cough    Vital Signs: Temp: 98.2 F (36.8 C) (11/07 1312) Temp Source: Oral (11/07 1312) BP: 189/84 (11/07 1706)  Labs: Recent Labs    06/15/19 1748 06/16/19 0027 06/16/19 0323 06/16/19 0550 06/16/19 0926 06/17/19 0720 06/17/19 1205 06/17/19 1835  HGB 10.0* 10.9*  --   --   --   --   --   --   HCT 32.6* 32.0*  --   --   --   --   --   --   PLT 306  --   --   --   --   --   --   --   APTT  --   --   --   --   --   --  28  --   LABPROT 16.4*  --   --   --   --  18.3*  --   --   INR 1.3*  --   --   --   --  1.5*  --   --   HEPARINUNFRC  --   --   --   --   --   --   --  0.47  CREATININE 4.63*  --  4.35* 4.38*  --  4.61*  --   --   TROPONINIHS 70*  --  86* 91* 96*  --   --   --     Estimated Creatinine Clearance: 13.3 mL/min (A) (by C-G formula based on SCr of 4.61 mg/dL (H)).   Medical History: Past Medical History:  Diagnosis Date  . Anemia   . Chronic combined systolic and diastolic CHF (congestive heart failure) ([place])    a. EF 40-45% in 2019 b. EF 25-30% by repeat echo in 01/2019  . CKD (chronic kidney disease), stage IV ([place])   . Coronary artery disease 08/2017   DES x 2 RCA  . DKA (diabetic ketoacidoses) ([place]) 06/16/2019  . GERD (gastroesophageal reflux disease)   . Heart murmur   . Hyperlipidemia   . Hypertension   . Proliferative diabetic retinopathy ([place])    left eye with vitreous hemorrhage and tractional retinal detachment  . Stroke ([place]) 09/2014   numbness left upper lip, finger tips on left hand; "resolved" (05/18/2016)  . Type II diabetes mellitus ([place])   . Wears glasses     Assessment: 63 yo F presents to the ED with weakness/DKA, on warfarin PTA for afib. She continues to have AMS despite correction of her electrolyte abnormalities.  CT / MRI ordered for rule out ischemic  changes.  MRI suspicious for subacute ischemic changes.  Pt continues on heparin/warfarin for anticoagulation.  Heparin level 0.47 on 1100 units/hr.  Goal of Therapy:  INR 2-3 Heparin level 0.3-0.5 units/ml Monitor platelets by anticoagulation protocol: Yes   Plan:  Continue heparin at 1100 units/hr Next heparin level with AM labs  [employer], Pharm.D., BCPS Clinical Pharmacist  **Pharmacist phone directory can now be found on [email or web address] ([email or web address]).  Listed under [place].  06/17/2019 8:33 PM

## 2019-06-17 NOTE — Procedures (Signed)
Patient Name: Amber Young  MRN: [id number]  Epilepsy Attending: Lora Havens  Referring Physician/Provider: Dr Kerney Elbe Date: 06/17/2019  Duration: 24.08 mins  Patient history: 63yo F with ams. EEG to evaluate for seizure  Level of alertness: awake  AEDs during EEG study: None  Technical aspects: This EEG study was done with scalp electrodes positioned according to the 10-20 International system of electrode placement. Electrical activity was acquired at a sampling rate of 500Hz  and reviewed with a high frequency filter of 70Hz  and a low frequency filter of 1Hz . EEG data were recorded continuously and digitally stored.   DESCRIPTION: EEG showed continuous generalized 2-5hz  theta-delta slowing. There were also triphasic waves, generalized, maximal bifrontal, more frequent when patient was stimulated. No clear posterior dominant rhythm was seen. Hyperventilation and photic stimulation were not performed.  ABNORMALITY - Continuous slow, generalized - Triphasic waves, generalized, maximum bifrontal  IMPRESSION: This study is suggestive of moderate to severe diffuse encephalopathy, likely but not limited to toxic-metabolic etiology.  No seizures or epileptiform discharges were seen throughout the recording.  Amber Young [name]

## 2019-06-17 NOTE — Evaluation (Signed)
Clinical/Bedside Swallow Evaluation Patient Details  Name: Amber Young MRN: [id number] Date of Birth: 01-10-1956  Today's Date: 06/17/2019 Time: SLP Start Time (ACUTE ONLY): 31 SLP Stop Time (ACUTE ONLY): 1547 SLP Time Calculation (min) (ACUTE ONLY): 17 min  Past Medical History:  Past Medical History:  Diagnosis Date  . Anemia   . Chronic combined systolic and diastolic CHF (congestive heart failure) ([place])    a. EF 40-45% in 2019 b. EF 25-30% by repeat echo in 01/2019  . CKD (chronic kidney disease), stage IV ([place])   . Coronary artery disease 08/2017   DES x 2 RCA  . DKA (diabetic ketoacidoses) ([place]) 06/16/2019  . GERD (gastroesophageal reflux disease)   . Heart murmur   . Hyperlipidemia   . Hypertension   . Proliferative diabetic retinopathy ([place])    left eye with vitreous hemorrhage and tractional retinal detachment  . Stroke ([place]) 09/2014   numbness left upper lip, finger tips on left hand; "resolved" (05/18/2016)  . Type II diabetes mellitus ([place])   . Wears glasses    Past Surgical History:  Past Surgical History:  Procedure Laterality Date  . CARDIAC CATHETERIZATION N/A 05/18/2016   Procedure: Left Heart Cath and Coronary Angiography;  Surgeon: Jettie Booze, MD;  Location: [facility];  Service: Cardiovascular;  Laterality: N/A;  . CARDIAC CATHETERIZATION N/A 05/18/2016   Procedure: Coronary Stent Intervention;  Surgeon: Jettie Booze, MD;  Location: [facility];  Service: Cardiovascular;  Laterality: N/A;  . CATARACT EXTRACTION Right 2020  . COLONOSCOPY W/ BIOPSIES AND POLYPECTOMY    . CORONARY ANGIOPLASTY    . CORONARY ANGIOPLASTY WITH STENT PLACEMENT    . CORONARY STENT INTERVENTION N/A 09/07/2017   Procedure: CORONARY STENT INTERVENTION;  Surgeon: Leonie Man, MD;  Location: [facility];  Service: Cardiovascular;  Laterality: N/A;  . EYE SURGERY    . GAS INSERTION Left 01/13/2018   Procedure: INSERTION OF GAS;  Surgeon:  Bernarda Caffey, MD;  Location: [facility];  Service: Ophthalmology;  Laterality: Left;  . LEFT HEART CATH AND CORONARY ANGIOGRAPHY N/A 09/07/2017   Procedure: LEFT HEART CATH AND CORONARY ANGIOGRAPHY;  Surgeon: Leonie Man, MD;  Location: [facility];  Service: Cardiovascular;  Laterality: N/A;  . MEMBRANE PEEL Left 01/13/2018   Procedure: MEMBRANE PEEL LEFT EYE ;  Surgeon: Bernarda Caffey, MD;  Location: [facility];  Service: Ophthalmology;  Laterality: Left;  Marland Kitchen MULTIPLE TOOTH EXTRACTIONS    . PARS PLANA VITRECTOMY Left 01/13/2018   Procedure: PARS PLANA VITRECTOMY WITH 25 GAUGE LEFT EYE WITH ENDOLASER;  Surgeon: Bernarda Caffey, MD;  Location: [facility];  Service: Ophthalmology;  Laterality: Left;  . TUBAL LIGATION  1980  . VAGINAL HYSTERECTOMY  1999   "partial; fibroids"   HPI:  63 year old female with hx of CVA and GERD admitted with altered mental status since 11/5.  She was found to be in DKA, started on insulin drip.  Due to lack of improvement in her mentation, neurology was consulted for toxic metabolic encephalopathy   Assessment / Plan / Recommendation Clinical Impression   Pt presents with a cognitively based dysphagia resulting from her slowed processing, decreased attention to tasks, and lethargy likely secondary from acute encephalopathy.  Pt demonstrated no overt s/s of aspiration with solids or liquids; however, she had poor awareness of and attention to solid boluses which resulted in prolonged and ineffective mastication of regular textures.  Pt ultimately had to expectorate solids from the oral  cavity due to the length of time required of her for mastication.  As a result, I would recommend downgrading pt's diet to dys 2 textures and thin liquids with full supervision for use of swallowing precautions.  I would suspect that as pt's mentation clears and her endurance improves that she will be able to be quickly advanced to regular textures again.    SLP Visit Diagnosis: Dysphagia,  unspecified (R13.10)    Aspiration Risk  Mild aspiration risk;Risk for inadequate nutrition/hydration    Diet Recommendation Thin liquid;Dysphagia 2 (Fine chop)   Liquid Administration via: Cup Medication Administration: Whole meds with liquid Supervision: Full supervision/cueing for compensatory strategies Compensations: Minimize environmental distractions;Slow rate;Small sips/bites Postural Changes: Seated upright at 90 degrees    Other  Recommendations Oral Care Recommendations: Oral care BID   Follow up Recommendations 24 hour supervision/assistance      Frequency and Duration min 2x/week          Prognosis Prognosis for Safe Diet Advancement: Good Barriers to Reach Goals: Cognitive deficits      Swallow Study   General HPI: 63 year old female with hx of CVA and GERD admitted with altered mental status since 11/5.  She was found to be in DKA, started on insulin drip.  Due to lack of improvement in her mentation, neurology was consulted for toxic metabolic encephalopathy Type of Study: Bedside Swallow Evaluation Previous Swallow Assessment: none on record Diet Prior to this Study: Regular;Thin liquids Temperature Spikes Noted: No Respiratory Status: Room air History of Recent Intubation: No Behavior/Cognition: Lethargic/Drowsy;Distractible;Requires cueing;Confused Oral Cavity - Dentition: Adequate natural dentition Vision: Functional for self-feeding Self-Feeding Abilities: Able to feed self;Needs assist Patient Positioning: Upright in bed Baseline Vocal Quality: Low vocal intensity Volitional Cough: Cognitively unable to elicit Volitional Swallow: Unable to elicit    Oral/Motor/Sensory Function Overall Oral Motor/Sensory Function: Within functional limits   Ice Chips     Thin Liquid Thin Liquid: Within functional limits    Nectar Thick     Honey Thick     Puree     Solid     Solid: Impaired Presentation: Self Fed Oral Phase Impairments: Impaired  mastication;Poor awareness of bolus Oral Phase Functional Implications: Oral residue;Prolonged oral transit;Impaired mastication      Hardin [place], Elmyra Ricks L 06/17/2019,1:57 PM

## 2019-06-17 NOTE — Progress Notes (Signed)
MRI attempted to get pt for their exam on 11/6 @7 :46p, RN could not accompany pt for monitoring. Second attempt made on 11/7 @5 :45a, RN could not accompany pt for monitoring. The morning shift will check back later to complete the MRI.

## 2019-06-17 NOTE — Progress Notes (Signed)
PROGRESS NOTE    Amber Young  [id number] DOB: Apr 24, 1956 DOA: 06/15/2019 PCP: Azzie Glatter, FNP     Brief Narrative:  Amber Young is a 63 year old female with anemia, chronic combined systolic and diastolic congestive heart failure EF 25 to 30% June 2020, PAF on Coumadin, CKD stage IV baseline creatinine 2.5-3.6 in June 20 and 4.9 in September 15, CAD status post PCI, hypertension, hyperlipidemia, CVA, non-insulin-dependent diabetes mellitus, GERD admitted with altered mental status since 11/5.  She was found to be in DKA, started on insulin drip.  Due to lack of improvement in her mentation, neurology was consulted.  New events last 24 hours / Subjective: Remains lethargic this morning, opens eyes and able to answer yes no intermittently but does not follow commands or participate in exam.  Currently undergoing EEG.  Assessment & Plan:   Principal Problem:   DKA (diabetic ketoacidoses) ([place]) Active Problems:   CKD (chronic kidney disease), stage IV ([facility])   Hypertensive urgency   AKI (acute kidney injury) ([place])   Acute metabolic encephalopathy   Acute encephalopathy   Acute metabolic encephalopathy -Thought to be multifactorial in setting of metabolic acidosis, DKA, uncontrolled hypertension -CT head negative -Neurology following -EEG suggestive of moderate to severe diffuse encephalopathy, likely but not limited to toxic-metabolic etiology. No seizures or epileptiform discharges were seen throughout the recording -MRI brain pending   DKA -Now off insulin drip -Hemoglobin A1c 8.7 -Continue Lantus, sliding scale insulin  Paroxysmal atrial fibrillation -IV heparin to bridge to Coumadin  Demand ischemia secondary to above illnesses -Troponin trend has been 76, 70, 91, 96  AKI on CKD stage IV -Baseline creatinine ranging from 2.5-3.6 in June 2020 and up to 4.9 in Sept 2020 -Renal ultrasound without hydronephrosis -Gentle IV fluid -Trend  BMP  Hyperlipidemia -Continue Lipitor  CAD status post PCI -Continue Brilinta, lipitor, coreg   Chronic combined systolic and diastolic heart failure with EF 25 to 30% -Not in acute exacerbation at this time, continue to watch fluid status -Continue coreg   Hypertensive urgency -Apparently patient had not been taking her blood pressure medications past 4 days prior to admission -Resume Coreg, hydralazine -Hold losartan, Lasix, metolazone in setting of AKI  Gallbladder thickening -Renal ultrasound revealed incidental finding of circumferential gallbladder wall thickening with potential sludge/stones -HIDA scan negative for acute cholecystitis  Hypokalemia -Replace, trend  Elevated liver enzymes -Trend   DVT prophylaxis: IV heparin Code Status: Full code Family Communication: None at bedside Disposition Plan: Pending clinical improvement   Consultants:   Neurology  Procedures:   EEG 11/7 IMPRESSION: This study is suggestive of moderate to severe diffuse encephalopathy, likely but not limited to toxic-metabolic etiology.  No seizures or epileptiform discharges were seen throughout the recording  Antimicrobials:  Anti-infectives (From admission, onward)   None        Objective: Vitals:   06/17/19 0011 06/17/19 0422 06/17/19 0600 06/17/19 0939  BP:    (!) 170/62  Pulse:      Resp:   19 19  Temp: 98.4 F (36.9 C) 98.1 F (36.7 C)  98.6 F (37 C)  TempSrc: Oral Oral  Oral  SpO2:    95%  Weight:      Height:        Intake/Output Summary (Last 24 hours) at 06/17/2019 1148 Last data filed at 06/17/2019 0600 Gross per 24 hour  Intake 600 ml  Output --  Net 600 ml   Filed Weights   06/16/19 1115  Weight: 80.1  kg    Examination:  General exam: Appears calm and comfortable, lethargic, does not wake up enough to participate in exam  Respiratory system: Clear to auscultation. Respiratory effort normal. No respiratory distress. Cardiovascular system:  S1 & S2 heard, RRR. No murmurs. No pedal edema. Gastrointestinal system: Abdomen is nondistended, soft and nontender. Normal bowel sounds heard. Central nervous system: Alert to voice only  Extremities: Symmetric in appearance  Skin: No rashes, lesions or ulcers on exposed skin   Data Reviewed: I have personally reviewed following labs and imaging studies  CBC: Recent Labs  Lab 06/15/19 1748 06/16/19 0027  WBC 6.2  --   HGB 10.0* 10.9*  HCT 32.6* 32.0*  MCV 80.7  --   PLT 306  --    Basic Metabolic Panel: Recent Labs  Lab 06/15/19 1748 06/16/19 0027 06/16/19 0323 06/16/19 0550 06/17/19 0720  NA 136 139 138 139 139  K 3.7 3.6 2.8* 3.4* 3.4*  CL 102  --  107 112* 109  CO2 15*  --  16* 16* 14*  GLUCOSE 441*  --  218* 116* 152*  BUN 50*  --  48* 48* 57*  CREATININE 4.63*  --  4.35* 4.38* 4.61*  CALCIUM 9.0  --  8.6* 8.8* 8.5*   GFR: Estimated Creatinine Clearance: 13.3 mL/min (A) (by C-G formula based on SCr of 4.61 mg/dL (H)). Liver Function Tests: Recent Labs  Lab 06/16/19 0926  AST 250*  ALT 224*  ALKPHOS 109  BILITOT 1.4*  PROT 5.9*  ALBUMIN 2.6*   No results for input(s): LIPASE, AMYLASE in the last 168 hours. Recent Labs  Lab 06/16/19 0926  AMMONIA 37*   Coagulation Profile: Recent Labs  Lab 06/15/19 1748 06/17/19 0720  INR 1.3* 1.5*   Cardiac Enzymes: No results for input(s): CKTOTAL, CKMB, CKMBINDEX, TROPONINI in the last 168 hours. BNP (last 3 results) No results for input(s): PROBNP in the last 8760 hours. HbA1C: Recent Labs    06/16/19 0650  HGBA1C 8.7*   CBG: Recent Labs  Lab 06/16/19 1818 06/16/19 2006 06/16/19 2209 06/17/19 0204 06/17/19 0751  GLUCAP 102* 141* 140* 137* 148*   Lipid Profile: No results for input(s): CHOL, HDL, LDLCALC, TRIG, CHOLHDL, LDLDIRECT in the last 72 hours. Thyroid Function Tests: Recent Labs    06/16/19 0926  TSH 1.650   Anemia Panel: Recent Labs    06/16/19 0926  VITAMINB12 4,278*    Sepsis Labs: No results for input(s): PROCALCITON, LATICACIDVEN in the last 168 hours.  Recent Results (from the past 240 hour(s))  SARS CORONAVIRUS 2 (TAT 6-24 HRS) Nasopharyngeal Nasopharyngeal Swab     Status: None   Collection Time: 06/16/19 12:10 AM   Specimen: Nasopharyngeal Swab  Result Value Ref Range Status   SARS Coronavirus 2 NEGATIVE NEGATIVE Final    Comment: (NOTE) SARS-CoV-2 target nucleic acids are NOT DETECTED. The SARS-CoV-2 RNA is generally detectable in upper and lower respiratory specimens during the acute phase of infection. Negative results do not preclude SARS-CoV-2 infection, do not rule out co-infections with other pathogens, and should not be used as the sole basis for treatment or other patient management decisions. Negative results must be combined with clinical observations, patient history, and epidemiological information. The expected result is Negative. Fact Sheet for Patients: SugarRoll.be Fact Sheet for Healthcare Providers: https://www.woods-mathews.com/ This test is not yet approved or cleared by the [place] FDA and  has been authorized for detection and/or diagnosis of SARS-CoV-2 by FDA under an Emergency Use Authorization (  EUA). This EUA will remain  in effect (meaning this test can be used) for the duration of the COVID-19 declaration under Section 56 4(b)(1) of the Act, 21 U.S.C. section 360bbb-3(b)(1), unless the authorization is terminated or revoked sooner. Performed at [facility], [place] [address] [place], [place] [address]   MRSA PCR Screening     Status: None   Collection Time: 06/16/19  6:26 PM   Specimen: Nasal Mucosa; Nasopharyngeal  Result Value Ref Range Status   MRSA by PCR NEGATIVE NEGATIVE Final    Comment:        The GeneXpert MRSA Assay (FDA approved for NASAL specimens only), is one component of a comprehensive MRSA colonization surveillance program. It is  not intended to diagnose MRSA infection nor to guide or monitor treatment for MRSA infections. Performed at [facility], [place] [address] [place],  [address]       Radiology Studies: Dg Chest 2 View  Result Date: 06/15/2019 CLINICAL DATA:  Weakness, loss of appetite EXAM: CHEST - 2 VIEW COMPARISON:  01/23/2019 FINDINGS: Cardiomegaly. No confluent opacities, effusions or edema. No acute bony abnormality. Old healed proximal left humeral fracture. IMPRESSION: Cardiomegaly.  No active disease. Electronically Signed   By: Rolm Baptise M.D.   On: 06/15/2019 19:10   Ct Head Wo Contrast  Result Date: 06/16/2019 CLINICAL DATA:  Altered mental status EXAM: CT HEAD WITHOUT CONTRAST TECHNIQUE: Contiguous axial images were obtained from the base of the skull through the vertex without intravenous contrast. COMPARISON:  CT head dated 09/30/2014 FINDINGS: Brain: No evidence of acute infarction, hemorrhage, hydrocephalus, extra-axial collection or mass lesion/mass effect. Atrophy and chronic microvascular ischemic changes are noted. Vascular: No hyperdense vessel or unexpected calcification. Skull: Normal. Negative for fracture or focal lesion. Sinuses/Orbits: No acute finding. Other: None. IMPRESSION: No acute intracranial abnormality Electronically Signed   By: Constance Holster M.D.   On: 06/16/2019 03:17   Nm Hepatobiliary Liver Func  Result Date: 06/16/2019 CLINICAL DATA:  Incidentally noted cholelithiasis and gallbladder wall thickening on renal ultrasound. EXAM: NUCLEAR MEDICINE HEPATOBILIARY IMAGING TECHNIQUE: Sequential images of the abdomen were obtained out to 60 minutes following intravenous administration of radiopharmaceutical. RADIOPHARMACEUTICALS:  5.26 mCi Tc-77m  Choletec IV COMPARISON:  Same-day renal ultrasound FINDINGS: Prompt uptake and biliary excretion of activity by the liver is seen. Gallbladder activity is visualized, consistent with patency of cystic duct. Biliary  activity passes into small bowel, consistent with patent common bile duct. Some reflux of tracer into the stomach is noted. IMPRESSION: No scintigraphic evidence of acute cholecystitis. Electronically Signed   By: Lovena Le M.D.   On: 06/16/2019 19:47   US Renal  Result Date: 06/16/2019 CLINICAL DATA:  64 year old female with a history of acute kidney injury EXAM: RENAL / URINARY TRACT ULTRASOUND COMPLETE COMPARISON:  None. FINDINGS: Right Kidney: Length: 11.0 cm x 4.3 cm x 4.7 cm, 117 cc. Echogenicity within normal limits. No mass or hydronephrosis visualized. Left Kidney: Length: 10.5 cm x 6.3 cm x 6.4 cm, 221 cc. Echogenicity within normal limits. No mass or hydronephrosis visualized. Bladder: Bladder partially distended.  Bladder jets not visualized. Additional: Gallbladder demonstrates circumferential gallbladder wall thickening of at least 1 cm. Echogenic material within the gallbladder lumen. Sonographic Murphy's sign not assessed given patient mental status. IMPRESSION: Negative for hydronephrosis. **An incidental finding of potential clinical significance has been found. Circumferential gallbladder wall thickening with potential sludge/stones, may represent incidental finding of cholecystitis. If there is concern for ductal obstruction  and acute cholecystitis, recommend correlation with patient presentation, lab values and potentially HIDA study.** Electronically Signed   By: Corrie Mckusick D.O.   On: 06/16/2019 09:54      Scheduled Meds:  atorvastatin  40 mg Oral q1800   carvedilol  25 mg Oral BID WC   ferrous sulfate  325 mg Oral QODAY   hydrALAZINE  100 mg Oral TID   insulin aspart  0-5 Units Subcutaneous QHS   insulin aspart  0-9 Units Subcutaneous Q6H   insulin glargine  4 Units Subcutaneous Daily   isosorbide dinitrate  20 mg Oral TID   potassium chloride  10 mEq Oral BID   sodium chloride flush  3 mL Intravenous Once   ticagrelor  90 mg Oral BID   warfarin  7.5 mg  Oral ONCE-1800   Warfarin - Pharmacist Dosing Inpatient   Does not apply q1800   Continuous Infusions:  sodium chloride 50 mL/hr at 06/16/19 1855   dextrose 5 % and 0.45% NaCl Stopped (06/16/19 [id number])   heparin       LOS: 1 day      Time spent: 35 minutes   Dessa Phi, DO [facility] 06/17/2019, 11:48 AM   Available via Epic secure chat 7am-7pm After these hours, please refer to coverage provider listed on [email or web address]

## 2019-06-17 NOTE — Progress Notes (Signed)
EEG complete - results pending 

## 2019-06-18 ENCOUNTER — Encounter (HOSPITAL_COMMUNITY): Payer: Self-pay | Admitting: Primary Care

## 2019-06-18 DIAGNOSIS — G9341 Metabolic encephalopathy: Secondary | ICD-10-CM

## 2019-06-18 LAB — CBC
HCT: 30.3 % — ABNORMAL LOW (ref 36.0–46.0)
Hemoglobin: 9.5 g/dL — ABNORMAL LOW (ref 12.0–15.0)
MCH: 25 pg — ABNORMAL LOW (ref 26.0–34.0)
MCHC: 31.4 g/dL (ref 30.0–36.0)
MCV: 79.7 fL — ABNORMAL LOW (ref 80.0–100.0)
Platelets: 250 10*3/uL (ref 150–400)
RBC: 3.8 MIL/uL — ABNORMAL LOW (ref 3.87–5.11)
RDW: 16.9 % — ABNORMAL HIGH (ref 11.5–15.5)
WBC: 8.1 10*3/uL (ref 4.0–10.5)
nRBC: 0.5 % — ABNORMAL HIGH (ref 0.0–0.2)

## 2019-06-18 LAB — COMPREHENSIVE METABOLIC PANEL
ALT: 178 U/L — ABNORMAL HIGH (ref 0–44)
AST: 100 U/L — ABNORMAL HIGH (ref 15–41)
Albumin: 2.3 g/dL — ABNORMAL LOW (ref 3.5–5.0)
Alkaline Phosphatase: 90 U/L (ref 38–126)
Anion gap: 13 (ref 5–15)
BUN: 60 mg/dL — ABNORMAL HIGH (ref 8–23)
CO2: 15 mmol/L — ABNORMAL LOW (ref 22–32)
Calcium: 8.2 mg/dL — ABNORMAL LOW (ref 8.9–10.3)
Chloride: 111 mmol/L (ref 98–111)
Creatinine, Ser: 4.53 mg/dL — ABNORMAL HIGH (ref 0.44–1.00)
GFR calc Af Amer: 11 mL/min — ABNORMAL LOW (ref >60)
GFR calc non Af Amer: 10 mL/min — ABNORMAL LOW (ref >60)
Glucose, Bld: 158 mg/dL — ABNORMAL HIGH (ref 70–99)
Potassium: 3.7 mmol/L (ref 3.5–5.1)
Sodium: 139 mmol/L (ref 135–145)
Total Bilirubin: 1.1 mg/dL (ref 0.3–1.2)
Total Protein: 5.3 g/dL — ABNORMAL LOW (ref 6.5–8.1)

## 2019-06-18 LAB — HEPARIN LEVEL (UNFRACTIONATED): Heparin Unfractionated: 0.71 IU/mL — ABNORMAL HIGH (ref 0.30–0.70)

## 2019-06-18 LAB — PROTIME-INR
INR: 2.3 — ABNORMAL HIGH (ref 0.8–1.2)
Prothrombin Time: 25 seconds — ABNORMAL HIGH (ref 11.4–15.2)

## 2019-06-18 LAB — GLUCOSE, CAPILLARY
Glucose-Capillary: 131 mg/dL — ABNORMAL HIGH (ref 70–99)
Glucose-Capillary: 140 mg/dL — ABNORMAL HIGH (ref 70–99)
Glucose-Capillary: 160 mg/dL — ABNORMAL HIGH (ref 70–99)

## 2019-06-18 MED ORDER — INSULIN GLARGINE 100 UNIT/ML SOLOSTAR PEN: 4.0000 [IU] | PEN_INJECTOR | Freq: Every day | SUBCUTANEOUS | 0 refills | Status: DC

## 2019-06-18 MED ORDER — WARFARIN SODIUM 2.5 MG PO TABS: 2.5000 mg | ORAL_TABLET | Freq: Once | ORAL | Status: DC

## 2019-06-18 NOTE — Evaluation (Addendum)
Physical Therapy Evaluation Patient Details Name: Amber Young MRN: [id number] DOB: August 18, 1955 Today's Date: 06/18/2019   History of Present Illness   63 year old woman with past medical history of A. fib on Coumadin, CHF, prior stroke, CKD 4 and diabetes presenting with altered mental status. Was found to be in diabetic ketoacidosis, thought to have multifactorial toxic metabolic encephalopathy. MRI showing a few punctate foci of diffusion  Clinical Impression  Pt admitted with above. Pt with improved mentation per RN; now A&Ox4, following all simple commands. Pt presents with decreased functional mobility secondary to cognitive impairments, left visual field deficit, balance impairments, mild left sided weakness and decreased activity tolerance. Ambulating 200 feet with walker and min guard assist; noted decreased left foot clearance. Recommended walker for mobility and follow up HHPT to maximize functional independence.      Follow Up Recommendations Home health PT;Supervision/Assistance - 24 hour    Equipment Recommendations  Rolling walker with 5" wheels    Recommendations for Other Services       Precautions / Restrictions Precautions Precautions: Fall;Other (comment) Precaution Comments: L visual field cut Restrictions Weight Bearing Restrictions: No      Mobility  Bed Mobility               General bed mobility comments: OOB in bathroom with RN  Transfers Overall transfer level: Needs assistance Equipment used: None Transfers: Sit to/from Stand Sit to Stand: Mod assist         General transfer comment: ModA from low surface of toilet, cues for hand placement and rocking forward to gain momentum  Ambulation/Gait Ambulation/Gait assistance: Min guard Gait Distance (Feet): 200 Feet Assistive device: None Gait Pattern/deviations: Step-through pattern;Decreased dorsiflexion - left;Decreased stride length Gait velocity: decreased   General Gait Details:  Pt requiring min guard for stability, arms in high guard position initially, noted decreased left foot clearance  Stairs            Wheelchair Mobility    Modified Rankin (Stroke Patients Only) Modified Rankin (Stroke Patients Only) Pre-Morbid Rankin Score: Moderate disability Modified Rankin: Moderately severe disability     Balance Overall balance assessment: Needs assistance Sitting-balance support: Feet supported Sitting balance-Leahy Scale: Good     Standing balance support: No upper extremity supported;During functional activity Standing balance-Leahy Scale: Fair Standing balance comment: Washing hands at sink with supervision                             Pertinent Vitals/Pain Pain Assessment: Faces Faces Pain Scale: No hurt    Home Living Family/patient expects to be discharged to:: Private residence Living Arrangements: Spouse/significant other;Children(daughter) Available Help at Discharge: Family;Available 24 hours/day Type of Home: House Home Access: Stairs to enter   [employer] of Steps: 4 Home Layout: One level        Prior Function Level of Independence: Needs assistance   Gait / Transfers Assistance Needed: does not use AD  ADL's / Homemaking Assistance Needed: States her daughter/husband assist with IADL's such as cooking, cleaning, medication management        Hand Dominance        Extremity/Trunk Assessment   Upper Extremity Assessment Upper Extremity Assessment: RUE deficits/detail;LUE deficits/detail RUE Deficits / Details: Strength 5/5 LUE Deficits / Details: Strength 4/5    Lower Extremity Assessment Lower Extremity Assessment: RLE deficits/detail;LLE deficits/detail RLE Deficits / Details: Strength 5/5 LLE Deficits / Details: Strength 5/5    Cervical / Trunk Assessment  Cervical / Trunk Assessment: Normal  Communication   Communication: No difficulties  Cognition Arousal/Alertness:  Awake/alert Behavior During Therapy: Flat affect Overall Cognitive Status: Impaired/Different from baseline Area of Impairment: Awareness;Safety/judgement;Memory                     Memory: Decreased short-term memory   Safety/Judgement: Decreased awareness of deficits Awareness: Emergent   General Comments: A&Ox4, following simple commands, poor historian in regards to differentiating symptoms that are acute vs chronic and PLOF. I.e. pt stating visual changes were new then stating she had it "for a while."      General Comments  BP 147/65, HR 57 post mobility    Exercises     Assessment/Plan    PT Assessment Patient needs continued PT services  PT Problem List Decreased strength;Decreased activity tolerance;Decreased balance;Decreased mobility;Decreased cognition;Decreased safety awareness       PT Treatment Interventions DME instruction;Gait training;Functional mobility training;Therapeutic activities;Therapeutic exercise;Balance training;Patient/family education;Stair training;Cognitive remediation    PT Goals (Current goals can be found in the Care Plan section)  Acute Rehab PT Goals Patient Stated Goal: none stated; agreeable to therapy PT Goal Formulation: With patient Time For Goal Achievement: 07/02/19 Potential to Achieve Goals: Good    Frequency Min 4X/week   Barriers to discharge        Co-evaluation               AM-PAC PT "6 Clicks" Mobility  Outcome Measure Help needed turning from your back to your side while in a flat bed without using bedrails?: None Help needed moving from lying on your back to sitting on the side of a flat bed without using bedrails?: A Little Help needed moving to and from a bed to a chair (including a wheelchair)?: A Little Help needed standing up from a chair using your arms (e.g., wheelchair or bedside chair)?: A Little Help needed to walk in hospital room?: A Little Help needed climbing 3-5 steps with a railing?  : A Lot 6 Click Score: 18    End of Session Equipment Utilized During Treatment: Gait belt Activity Tolerance: Patient tolerated treatment well Patient left: in chair;with call bell/phone within reach;with chair alarm set;with nursing/sitter in room Nurse Communication: Mobility status PT Visit Diagnosis: Unsteadiness on feet (R26.81);Difficulty in walking, not elsewhere classified (R26.2)    Time: [id number] PT Time Calculation (min) (ACUTE ONLY): 18 min   Charges:   PT Evaluation $PT Eval Moderate Complexity: 1 Mod          Ellamae Sia, [place], DPT Acute Rehabilitation Services Pager [phone] Office [phone]   Willy Eddy 06/18/2019, 10:11 AM

## 2019-06-18 NOTE — Progress Notes (Signed)
Occupational Therapy Evaluation Patient Details Name: Amber Young MRN: [id number] DOB: April 29, 1956 Today's Date: 06/18/2019    History of Present Illness  63 year old woman with past medical history of A. fib on Coumadin, CHF, prior stroke, CKD 4 and diabetes presenting with altered mental status. Was found to be in diabetic ketoacidosis, thought to have multifactorial toxic metabolic encephalopathy. MRI showing a few punctate foci of diffusion   Clinical Impression   Pt presents with above diagnosis. PTA pt PLOF requiring some assistance with ADLs and IADLs in home environment from family support. Pt reports no use of AE. Pt currently limited with weakness, significantly L side as well functional mobility and transfers. Pt demonstrates decreased cognition requiring VC's for sequencing and safety. Pt would benefit from additional acute OT to address safety with ADLs, cognition, and strength. DC recommendation HHOT with 24hr supervision.    Follow Up Recommendations  Home health OT;Supervision/Assistance - 24 hour    Equipment Recommendations  3 in 1 bedside commode    Recommendations for Other Services       Precautions / Restrictions Precautions Precautions: Fall;Other (comment) Precaution Comments: L visual field cut Restrictions Weight Bearing Restrictions: No      Mobility Bed Mobility               General bed mobility comments: Pt in chair upon arrival with RN present  Transfers Overall transfer level: Needs assistance Equipment used: None Transfers: Sit to/from Stand Sit to Stand: Mod assist              Balance Overall balance assessment: Needs assistance Sitting-balance support: Feet supported Sitting balance-Leahy Scale: Good     Standing balance support: No upper extremity supported;During functional activity Standing balance-Leahy Scale: Fair Standing balance comment: Washing hands at sink with supervision                            ADL either performed or assessed with clinical judgement   ADL Overall ADL's : Needs assistance/impaired Eating/Feeding: Independent;Bed level   Grooming: Wash/dry hands;Wash/dry face;Oral care;Set up;Sitting   Upper Body Bathing: Set up   Lower Body Bathing: Minimal assistance   Upper Body Dressing : Set up   Lower Body Dressing: Set up;Min guard Lower Body Dressing Details (indicate cue type and reason): min gaurd for safety with reaching forward to don/doff sock Toilet Transfer: Moderate assistance;Cueing for safety;Cueing for sequencing;RW;Ambulation Toilet Transfer Details (indicate cue type and reason): simulated toilet transfer from chair back to chair. physical assistance required to power up to stand         Functional mobility during ADLs: Moderate assistance;Cueing for safety;Cueing for sequencing;Rolling walker General ADL Comments: VC's required for safety with sequence. Pt demonstrates slow processing with VC's.     Vision         Perception     Praxis      Pertinent Vitals/Pain Pain Assessment: Faces Faces Pain Scale: No hurt     Hand Dominance     Extremity/Trunk Assessment Upper Extremity Assessment Upper Extremity Assessment: RUE deficits/detail;LUE deficits/detail;Generalized weakness   Lower Extremity Assessment Lower Extremity Assessment: Defer to PT evaluation RLE Deficits / Details: Strength 5/5 LLE Deficits / Details: Strength 5/5   Cervical / Trunk Assessment Cervical / Trunk Assessment: Normal   Communication Communication Communication: No difficulties   Cognition Arousal/Alertness: Awake/alert Behavior During Therapy: Flat affect Overall Cognitive Status: Impaired/Different from baseline Area of Impairment: Awareness;Safety/judgement;Memory  Memory: Decreased short-term memory   Safety/Judgement: Decreased awareness of deficits Awareness: Emergent   General Comments: A&Ox3, following simple  commands, poor historian in regards to differentiating symptoms that are acute vs chronic and PLOF. I.e. pt stating visual changes were new then stating she had it "for a while."   General Comments       Exercises     Shoulder Instructions      Home Living Family/patient expects to be discharged to:: Private residence Living Arrangements: Spouse/significant other;Children(daughter) Available Help at Discharge: Family;Available 24 hours/day Type of Home: House Home Access: Stairs to enter [employer] of Steps: 4   Home Layout: One level                          Prior Functioning/Environment Level of Independence: Needs assistance  Gait / Transfers Assistance Needed: does not use AD ADL's / Homemaking Assistance Needed: States her daughter/husband assist with IADL's such as cooking, cleaning, medication management            OT Problem List: Decreased strength;Decreased range of motion;Impaired balance (sitting and/or standing);Decreased cognition;Decreased knowledge of use of DME or AE;Decreased safety awareness;Pain      OT Treatment/Interventions: Self-care/ADL training;Therapeutic exercise;DME and/or AE instruction;Therapeutic activities;Cognitive remediation/compensation;Patient/family education;Balance training    OT Goals(Current goals can be found in the care plan section) Acute Rehab OT Goals Patient Stated Goal: none stated; agreeable to therapy OT Goal Formulation: Patient unable to participate in goal setting Time For Goal Achievement: 07/02/19 Potential to Achieve Goals: Fair  OT Frequency: Min 2X/week   Barriers to D/C:            Co-evaluation              AM-PAC OT "6 Clicks" Daily Activity     Outcome Measure Help from another person eating meals?: None Help from another person taking care of personal grooming?: A Little Help from another person toileting, which includes using toliet, bedpan, or urinal?: A Lot Help from  another person bathing (including washing, rinsing, drying)?: A Lot Help from another person to put on and taking off regular upper body clothing?: A Little Help from another person to put on and taking off regular lower body clothing?: A Lot 6 Click Score: 16   End of Session Equipment Utilized During Treatment: Gait belt;Rolling walker Nurse Communication: Mobility status  Activity Tolerance: Patient tolerated treatment well Patient left: in chair;with call bell/phone within reach;with chair alarm set  OT Visit Diagnosis: Unsteadiness on feet (R26.81);Muscle weakness (generalized) (M62.81)                Time: 1000-1020 OT Time Calculation (min): 20 min Charges:  OT General Charges $OT Visit: 1 Visit OT Evaluation $OT Eval Low Complexity: [place], MSOT, OTR/L  Supplemental Rehabilitation Services  [phone]   Marius Ditch 06/18/2019, 10:47 AM

## 2019-06-18 NOTE — Progress Notes (Signed)
Nsg Discharge Note patient being transported per family VSS. PT/OT evaluated home  Admit Date:  06/15/2019 Discharge date: 06/18/2019   Amber Young to be D/C'd Home per MD order.  AVS completed.  Copy for chart, and copy for patient signed, and dated. Patient/caregiver able to verbalize understanding.  Discharge Medication: Allergies as of 06/18/2019      Reactions   Ace Inhibitors Cough      Medication List    STOP taking these medications   furosemide 80 MG tablet Commonly known as: LASIX   glimepiride 1 MG tablet Commonly known as: AMARYL   hydrochlorothiazide 25 MG tablet Commonly known as: HYDRODIURIL   losartan 100 MG tablet Commonly known as: COZAAR   metFORMIN 500 MG tablet Commonly known as: GLUCOPHAGE   metolazone 2.5 MG tablet Commonly known as: ZAROXOLYN   potassium chloride 10 MEQ tablet Commonly known as: KLOR-CON     TAKE these medications   acetaminophen 500 MG tablet Commonly known as: TYLENOL Take 1,000 mg by mouth every 6 (six) hours as needed for mild pain or headache.   amLODipine 10 MG tablet Commonly known as: NORVASC Take 10 mg by mouth daily.   atorvastatin 40 MG tablet Commonly known as: LIPITOR Take 1 tablet (40 mg total) by mouth daily at 6 PM.   brimonidine 0.2 % ophthalmic solution Commonly known as: ALPHAGAN Place 1 drop into the left eye 2 (two) times daily.   carvedilol 25 MG tablet Commonly known as: COREG Take 1 tablet (25 mg total) by mouth 2 (two) times daily.   dorzolamide-timolol 22.3-6.8 MG/ML ophthalmic solution Commonly known as: COSOPT Place 1 drop into the left eye 2 (two) times daily.   ferrous sulfate 325 (65 FE) MG tablet Commonly known as: FerrouSul Take 1 tablet (325 mg total) by mouth every other day.   glipiZIDE 10 MG tablet Commonly known as: GLUCOTROL Take 1 tablet (10 mg total) by mouth 2 (two) times daily before a meal.   hydrALAZINE 100 MG tablet Commonly known as: APRESOLINE Take 1  tablet (100 mg total) by mouth 3 (three) times daily.   Insulin Glargine 100 UNIT/ML Solostar Pen Commonly known as: LANTUS Inject 4 Units into the skin daily.   isosorbide dinitrate 20 MG tablet Commonly known as: ISORDIL Take 1 tablet (20 mg total) by mouth 3 (three) times daily.   ketorolac 0.4 % Soln Commonly known as: ACULAR Place 1 drop into the right eye 4 (four) times daily.   nitroGLYCERIN 0.4 MG SL tablet Commonly known as: NITROSTAT Place 1 tablet (0.4 mg total) under the tongue every 5 (five) minutes x 3 doses as needed for chest pain.   ofloxacin 0.3 % ophthalmic solution Commonly known as: OCUFLOX Place 1 drop into the right eye 3 (three) times daily.   ticagrelor 90 MG Tabs tablet Commonly known as: BRILINTA Take 1 tablet (90 mg total) by mouth 2 (two) times daily.   TRUEplus Pen Needles 31G X 6 MM Misc Generic drug: Insulin Pen Needle USE AS DIRECTED   warfarin 5 MG tablet Commonly known as: COUMADIN Take as directed. If you are unsure how to take this medication, talk to your nurse or doctor. Original instructions: Take 5mg  daily except for 7.5mg  every Monday, Wednesday and Friday            Durable Medical Equipment  (From admission, onward)         Start     Ordered   06/18/19 1119  For home  use only DME Walker rolling  Once    Question:  Patient needs a walker to treat with the following condition  Answer:  Weakness   06/18/19 1118   06/18/19 1118  For home use only DME 3 n 1  Once     06/18/19 1118          Discharge Assessment: Vitals:   06/18/19 0843 06/18/19 0918  BP:  (!) 152/70  Pulse:    Resp: 19   Temp:    SpO2:     Skin clean, dry and intact without evidence of skin break down, no evidence of skin tears noted. IV catheter discontinued intact. Site without signs and symptoms of complications - no redness or edema noted at insertion site, patient denies c/o pain - only slight tenderness at site.  Dressing with slight pressure  applied.  D/c Instructions-Education: Discharge instructions given to patient/family with verbalized understanding. D/c education completed with patient/family including follow up instructions, medication list, d/c activities limitations if indicated, with other d/c instructions as indicated by MD - patient able to verbalize understanding, all questions fully answered. Patient instructed to return to ED, call 911, or call MD for any changes in condition.  Patient escorted via [place], and D/C home via private auto.  Merlyn Lot, RN 06/18/2019 2:28 PM

## 2019-06-18 NOTE — Progress Notes (Addendum)
Neurology Progress Note   S:// No acute issues overnight. Patient awake, alert, sitting up in chair.NAD. able to recall daughters name, ask appropriate questions concerning care.  O:// Current vital signs: BP (!) 152/70   Pulse 67   Temp 97.9 F (36.6 C) (Oral)   Resp 19   Ht 5\' 6"  (1.676 m)   Wt 80.1 kg   SpO2 100%   BMI 28.50 kg/m  Vital signs in last 24 hours: Temp:  [97.8 F (36.6 C)-98.2 F (36.8 C)] 97.9 F (36.6 C) (11/08 0421) Resp:  [16-23] 19 (11/08 0843) BP: (148-189)/(62-84) 152/70 (11/08 0918) SpO2:  [94 %-100 %] 100 % (11/08 0800)  Neurological exam Mental status:awake, sitting up in chair. Able to follow commands. Oriented to all. Cranial nerves: Pupils equal round react light, extraocular movements appear intact, face is symmetric. Motor exam: Able to raise all 4 extremities  antigravity with no ataxia. Sensory exam: Intact to touch all over Gait: deferred  General: Sleeping in bed in no apparent distress HEENT: Normocephalic atraumatic CVS: Regular rate rhythm Respiratory: Breathing normally and saturating well on room air Abdomen: Nondistended nontender   Medications  Current Facility-Administered Medications:  .  0.9 %  sodium chloride infusion, , Intravenous, Continuous, Dessa Phi, DO, Last Rate: 50 mL/hr at 06/18/19 0727 .  amLODipine (NORVASC) tablet 10 mg, 10 mg, Oral, Daily, Dessa Phi, DO, 10 mg at 06/18/19 0918 .  atorvastatin (LIPITOR) tablet 40 mg, 40 mg, Oral, q1800, Kc, Ramesh, MD, 40 mg at 06/17/19 1704 .  carvedilol (COREG) tablet 25 mg, 25 mg, Oral, BID WC, Kc, Ramesh, MD, Stopped at 06/17/19 1626 .  ferrous sulfate tablet 325 mg, 325 mg, Oral, QODAY, Kc, Ramesh, MD, 325 mg at 06/18/19 0920 .  hydrALAZINE (APRESOLINE) injection 10 mg, 10 mg, Intravenous, Q4H PRN, Shela Leff, MD, 10 mg at 06/16/19 1028 .  hydrALAZINE (APRESOLINE) tablet 100 mg, 100 mg, Oral, TID, Kc, Ramesh, MD, 100 mg at 06/18/19 0920 .  insulin  aspart (novoLOG) injection 0-5 Units, 0-5 Units, Subcutaneous, QHS, Kc, Ramesh, MD .  insulin aspart (novoLOG) injection 0-9 Units, 0-9 Units, Subcutaneous, TID AC & HS, Dessa Phi, DO, 1 Units at 06/18/19 [id number] .  insulin glargine (LANTUS) injection 4 Units, 4 Units, Subcutaneous, Daily, Kc, Ramesh, MD, 4 Units at 06/18/19 0921 .  isosorbide dinitrate (ISORDIL) tablet 20 mg, 20 mg, Oral, TID, Kc, Ramesh, MD, 20 mg at 06/18/19 0922 .  labetalol (NORMODYNE) injection 10 mg, 10 mg, Intravenous, Q4H PRN, Kc, Ramesh, MD .  nitroGLYCERIN (NITROSTAT) SL tablet 0.4 mg, 0.4 mg, Sublingual, Q5 Min x 3 PRN, Kc, Ramesh, MD .  potassium chloride (KLOR-CON) CR tablet 10 mEq, 10 mEq, Oral, BID, Kc, Ramesh, MD, 10 mEq at 06/18/19 0922 .  sodium chloride flush (NS) 0.9 % injection 3 mL, 3 mL, Intravenous, Once, Shela Leff, MD .  ticagrelor (BRILINTA) tablet 90 mg, 90 mg, Oral, BID, Kc, Ramesh, MD, 90 mg at 06/18/19 0923 .  warfarin (COUMADIN) tablet 2.5 mg, 2.5 mg, Oral, ONCE-1800, Dessa Phi, DO .  Warfarin - Pharmacist Dosing Inpatient, , Does not apply, q1800, [facility] Labs CBC    Component Value Date/Time   WBC 8.1 06/18/2019 0317   RBC 3.80 (L) 06/18/2019 0317   HGB 9.5 (L) 06/18/2019 0317   HGB 8.4 (L) 01/24/2018 1048   HCT 30.3 (L) 06/18/2019 0317   HCT 27.2 (L) 01/24/2018 1048   PLT 250 06/18/2019 0317   PLT 356 01/24/2018 1048  MCV 79.7 (L) 06/18/2019 0317   MCV 78 (L) 01/24/2018 1048   MCH 25.0 (L) 06/18/2019 0317   MCHC 31.4 06/18/2019 0317   RDW 16.9 (H) 06/18/2019 0317   RDW 17.6 (H) 01/24/2018 1048   LYMPHSABS 1.4 07/22/2018 0630   LYMPHSABS 1.6 01/24/2018 1048   MONOABS 0.1 07/22/2018 0630   EOSABS 0.0 07/22/2018 0630   EOSABS 0.1 01/24/2018 1048   BASOSABS 0.0 07/22/2018 0630   BASOSABS 0.0 01/24/2018 1048    CMP     Component Value Date/Time   NA 139 06/18/2019 0317   NA 140 04/25/2019 1202   K 3.7 06/18/2019 0317   CL 111 06/18/2019 0317    CO2 15 (L) 06/18/2019 0317   GLUCOSE 158 (H) 06/18/2019 0317   BUN 60 (H) 06/18/2019 0317   BUN 71 (H) 04/25/2019 1202   CREATININE 4.53 (H) 06/18/2019 0317   CREATININE 1.87 (H) 05/27/2018 1221   CALCIUM 8.2 (L) 06/18/2019 0317   PROT 5.3 (L) 06/18/2019 0317   PROT 6.4 01/24/2018 1048   ALBUMIN 2.3 (L) 06/18/2019 0317   ALBUMIN 3.1 (L) 01/24/2018 1048   AST 100 (H) 06/18/2019 0317   AST 15 12/13/2017 1049   ALT 178 (H) 06/18/2019 0317   ALT 9 12/13/2017 1049   ALKPHOS 90 06/18/2019 0317   BILITOT 1.1 06/18/2019 0317   BILITOT <0.2 01/24/2018 1048   BILITOT 0.2 12/13/2017 1049   GFRNONAA 10 (L) 06/18/2019 0317   GFRNONAA 31 (L) 12/13/2017 1049   GFRNONAA 43 (L) 07/09/2017 1047   GFRAA 11 (L) 06/18/2019 0317   GFRAA 36 (L) 12/13/2017 1049   GFRAA 50 (L) 07/09/2017 1047   B12: 4278 on 06/16/2019 BUN: 60 06/18/2019 Creatinine 4.53 06/18/2019 Imaging I have reviewed images in epic and the results pertinent to this consultation are: CT-scan of the brain-no acute changes   Laurey Morale, MSN, NP-C [facility] [phone]    Attending Neurologist note to follow with final recommendations: Attending addendum Patient seen and examined. Imaging reviewed. MRI findings likely secondary to DKA and not to strokes.  Assessment: 63 year old woman with past medical history of A. fib on Coumadin, CHF, prior stroke, CKD 4 and diabetes presenting with altered mental status since past Sunday, was found to be in diabetic ketoacidosis which has been since corrected but continues to be altered in terms of her mentation. Overall, her clinical picture looks consistent with toxic metabolic encephalopathy in the setting of DKA and decreased p.o. intake along with deranged renal function. No active evidence of automatisms or seizure-like activity or seizures. EEG completed-no evidence of epileptogenicity, only continuous slowing and triphasic waves consistent with toxic metabolic  encephalopathy. MRI official read was consistent with few punctate foci of restricted diffusion in the anterior right corona radiata along with some restricted diffusion in splenium of the corpus callosum these findings are likely consistent with DKA and not true strokes.   Impression: -Multifactorial toxic metabolic encephalopathy, suspect due to uremia, deranged liver function and DKA -DKA -CKD -MRI brain findings of questionable stroke likely due to multiple toxic metabolic derangements including uremia, DKA and not true stroke.  Recommendations: -Management of toxic metabolic derangements per primary team as you are. -I suspect that at this time, she is still in the process of recovering from the toxic metabolic insults that she has had over the past few days to week and given multiple system involvements, she might take some time to return to baseline as these changes are corrected.  Clinical  improvement usually lags laboratory improvement. -Continue anticoagulation for A. Fib. For stroke prophylaxis - neurology will sign off at this time, please call with any further concerns.  -- Amie [place], MD [facility] Pager: [phone] If 7pm to 7am, please call on call as listed on AMION.

## 2019-06-18 NOTE — Progress Notes (Signed)
[place] for Warfarin  Indication: atrial fibrillation  Allergies  Allergen Reactions  . Ace Inhibitors Cough    Vital Signs: Temp: 97.9 F (36.6 C) (11/08 0421) Temp Source: Oral (11/08 0421) BP: 165/66 (11/08 0720)  Labs: Recent Labs    06/15/19 1748 06/16/19 0027 06/16/19 0323 06/16/19 0550 06/16/19 0926 06/17/19 0720 06/17/19 1205 06/17/19 1835 06/18/19 0317  HGB 10.0* 10.9*  --   --   --   --   --   --  9.5*  HCT 32.6* 32.0*  --   --   --   --   --   --  30.3*  PLT 306  --   --   --   --   --   --   --  250  APTT  --   --   --   --   --   --  28  --   --   LABPROT 16.4*  --   --   --   --  18.3*  --   --  25.0*  INR 1.3*  --   --   --   --  1.5*  --   --  2.3*  HEPARINUNFRC  --   --   --   --   --   --   --  0.47 0.71*  CREATININE 4.63*  --  4.35* 4.38*  --  4.61*  --   --  4.53*  TROPONINIHS 70*  --  86* 91* 96*  --   --   --   --     Estimated Creatinine Clearance: 13.6 mL/min (A) (by C-G formula based on SCr of 4.53 mg/dL (H)).   Medical History: Past Medical History:  Diagnosis Date  . Anemia   . Chronic combined systolic and diastolic CHF (congestive heart failure) ([place])    a. EF 40-45% in 2019 b. EF 25-30% by repeat echo in 01/2019  . CKD (chronic kidney disease), stage IV ([place])   . Coronary artery disease 08/2017   DES x 2 RCA  . DKA (diabetic ketoacidoses) ([place]) 06/16/2019  . GERD (gastroesophageal reflux disease)   . Heart murmur   . Hyperlipidemia   . Hypertension   . Proliferative diabetic retinopathy ([place])    left eye with vitreous hemorrhage and tractional retinal detachment  . Stroke ([place]) 09/2014   numbness left upper lip, finger tips on left hand; "resolved" (05/18/2016)  . Type II diabetes mellitus ([place])   . Wears glasses     Assessment: 63 y/o F presents to the ED with weakness/DKA, on warfarin PTA for afib. Chadsvasc score = 7. Her home regimen is warfarin 5 mg daily except 7.5 mg every MWF.  Last dose prior to admission 06/12/2019. The patient was receiving IV heparin since yesterday 11/7 bridging to warfarin due to subtherapeutic INRs.   INR today is therapeutic at 2.3. INR increased from 1.5 to 2.3 since yesterday after receiving 7.5 mg last night. Hgb is down to 9.5 from 10.9 on 11/6. The heparin level this morning was supratherapeutic at 0.71 (goal 0.3-0.5 with concern for stroke). The rate was temporarily changed from 1100 units/hr to 950 units/hr and then discontinued by the provider.   Goal of Therapy:  INR 2-3 Monitor platelets by anticoagulation protocol: Yes   Plan:  Warfarin 2.5 mg PO x 1 this evening at 1800, being conservative d/t INR increase from 1.5>2.3 in 24 hours Heparin gtt discontinued Await recommendations  for anticoagulation per neurology Daily PT/INR Monitor for s/sx of bleeding   Agnes Lawrence, PharmD PGY1 Pharmacy Resident

## 2019-06-18 NOTE — TOC Transition Note (Signed)
Transition of Care [facility]) - CM/SW Discharge Note   Patient Details  Name: Amber Young MRN: [id number] Date of Birth: 1955-12-14  Transition of Care [facility]) CM/SW Contact:  Carles Collet, RN Phone Number: 06/18/2019, 11:25 AM   Clinical Narrative:   Spoke to patient, agreeable to [facility] services would like to use [facility], and needs RW and 3/1. Referrals made, DME to be delivered to room prior to DC, Daughter will provide transportation home.    Final next level of care: [place] Barriers to Discharge: No Barriers Identified   Patient Goals and CMS Choice Patient states their goals for this hospitalization and ongoing recovery are:: to go home CMS Medicare.gov Compare Post Acute Care list provided to:: Patient Choice offered to / list presented to : Patient  Discharge Placement                       Discharge Plan and Services                DME Arranged: 3-N-1, Walker rolling DME Agency: [employer] Date DME Agency Contacted: 06/18/19 Time DME Agency Contacted: [id number] Representative spoke with at DME Agency: Bertrum Sol HH Arranged: RN, PT, OT [place] Agency: [facility] [place] Date Welcome: 06/18/19 Time [place]: [id number] Representative spoke with at [place]: Cassie  Social Determinants of Health ([place]) Interventions     Readmission Risk Interventions No flowsheet data found.

## 2019-06-18 NOTE — Discharge Summary (Signed)
Physician Discharge Summary  Amber Young [id number] DOB: Feb 05, 1956 DOA: 06/15/2019  PCP: Azzie Glatter, FNP  Admit date: 06/15/2019 Discharge date: 06/18/2019  Admitted From: Home Disposition:  Home with home health  Recommendations for Outpatient Follow-up:  1. Follow up with PCP in 1 week to follow-up on diabetes 2. 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  She was found to be in DKA, started on insulin drip.  Due to lack of improvement in her mentation, neurology was consulted.  Patient underwent MRI brain and EEG.  MRI brain finding of questionable stroke but this was thought to be changes consistent with DKA and not true strokes, per neurology.  On day of discharge, patient was alert, awake, oriented x3 and blood sugars improved.  She is discharged home with home health PT OT RN.  Discharge Diagnoses:  Principal Problem:   DKA (diabetic ketoacidoses) ([place]) Active Problems:   CKD (chronic kidney disease), stage IV ([facility])   Hypertensive urgency   AKI (acute kidney injury) ([place])   Acute metabolic encephalopathy   Acute encephalopathy   Acute metabolic encephalopathy -Thought to be multifactorial in setting of metabolic acidosis, DKA, uncontrolled hypertension -CT head negative -Neurology consulted -EEG suggestive of moderate to severe diffuse encephalopathy, likely but not limited to toxic-metabolic etiology.No seizures or epileptiform discharges  were seen throughout the recording -MRI brain revealed few punctate foci of restricted diffusion in the anterior right corona radiata along with some restricted diffusion in splenium of the corpus callosum these findings are likely consistent with DKA and not true strokes -Resolved.  Alert, oriented x3 this morning  DKA -Now off insulin drip -Hemoglobin A1c 8.7 -Continue Lantus, sliding scale insulin -Stop Metformin in setting of CKD  Paroxysmal atrial fibrillation -Resume Coumadin  Demand ischemia secondary to above illnesses -Troponin trend has been 76, 70, 91, 96  AKI vs CKD stage IV -Baseline creatinine ranging from 2.5-3.6 in June 2020 and up to 4.9 in Sept 2020 -Renal ultrasound without hydronephrosis -Her creatinine seems to be now her new baseline.  She reports that she does follow-up with a nephrologist  Hyperlipidemia -Continue Lipitor  CAD status post PCI -Continue Brilinta, lipitor, coreg   Chronic combined systolic and diastolic heart failure with EF 25 to 30% -Not in acute exacerbation at this time, continue to watch fluid status -Continue coreg   Hypertensive urgency -Apparently patient had not been taking her blood pressure medications past 4 days prior to admission -Resume Coreg, hydralazine -Hold losartan, Lasix, metolazone until follow-up with nephrology  Gallbladder thickening -Renal ultrasound revealed incidental finding of circumferential gallbladder wall thickening with potential sludge/stones -HIDA scan negative for acute cholecystitis  Elevated liver enzymes -Improved overnight    Discharge Instructions  Discharge Instructions    Call MD for:   Complete by: As directed    Blood sugar < 70 or > 250   Call MD for:  difficulty breathing, headache or visual disturbances   Complete by: As directed    Call MD for:  extreme fatigue   Complete by: As  directed    Call MD for:  persistant dizziness or light-headedness   Complete by: As  directed    Call MD for:  persistant nausea and vomiting   Complete by: As directed    Call MD for:  severe uncontrolled pain   Complete by: As directed    Call MD for:  temperature >100.4   Complete by: As directed    Diet Carb Modified   Complete by: As directed    Discharge instructions   Complete by: As directed    You were cared for by a hospitalist during your hospital stay. If you have any questions about your discharge medications or the care you received while you were in the hospital after you are discharged, you can call the unit and ask to speak with the hospitalist on call if the hospitalist that took care of you is not available. Once you are discharged, your primary care physician will handle any further medical issues. Please note that NO REFILLS for any discharge medications will be authorized once you are discharged, as it is imperative that you return to your primary care physician (or establish a relationship with a primary care physician if you do not have one) for your aftercare needs so that they can reassess your need for medications and monitor your lab values.   Discharge instructions   Complete by: As directed    Please hold lasix, hydrochlorothiazide, cozaar, zaroxolyn, potassium pills until follow up with your Nephrologist as an outpatient. You should stop metformin due to your kidney function.   Increase activity slowly   Complete by: As directed      Allergies as of 06/18/2019      Reactions   Ace Inhibitors Cough      Medication List    STOP taking these medications   furosemide 80 MG tablet Commonly known as: LASIX   glimepiride 1 MG tablet Commonly known as: AMARYL   hydrochlorothiazide 25 MG tablet Commonly known as: HYDRODIURIL   losartan 100 MG tablet Commonly known as: COZAAR   metFORMIN 500 MG tablet Commonly known as: GLUCOPHAGE   metolazone 2.5 MG tablet Commonly known as: ZAROXOLYN   potassium chloride 10 MEQ tablet Commonly  known as: KLOR-CON     TAKE these medications   acetaminophen 500 MG tablet Commonly known as: TYLENOL Take 1,000 mg by mouth every 6 (six) hours as needed for mild pain or headache.   amLODipine 10 MG tablet Commonly known as: NORVASC Take 10 mg by mouth daily.   atorvastatin 40 MG tablet Commonly known as: LIPITOR Take 1 tablet (40 mg total) by mouth daily at 6 PM.   brimonidine 0.2 % ophthalmic solution Commonly known as: ALPHAGAN Place 1 drop into the left eye 2 (two) times daily.   carvedilol 25 MG tablet Commonly known as: COREG Take 1 tablet (25 mg total) by mouth 2 (two) times daily.   dorzolamide-timolol 22.3-6.8 MG/ML ophthalmic solution Commonly known as: COSOPT Place 1 drop into the left eye 2 (two) times daily.   ferrous sulfate 325 (65 FE) MG tablet Commonly known as: FerrouSul Take 1 tablet (325 mg total) by mouth every other day.   glipiZIDE 10 MG tablet Commonly known as: GLUCOTROL Take 1 tablet (10 mg total) by mouth 2 (two) times daily before a meal.   hydrALAZINE 100 MG tablet Commonly known as: APRESOLINE Take 1 tablet (100 mg total) by mouth 3 (three) times daily.   Insulin Glargine 100 UNIT/ML Solostar Pen  Commonly known as: LANTUS Inject 4 Units into the skin daily.   isosorbide dinitrate 20 MG tablet Commonly known as: ISORDIL Take 1 tablet (20 mg total) by mouth 3 (three) times daily.   ketorolac 0.4 % Soln Commonly known as: ACULAR Place 1 drop into the right eye 4 (four) times daily.   nitroGLYCERIN 0.4 MG SL tablet Commonly known as: NITROSTAT Place 1 tablet (0.4 mg total) under the tongue every 5 (five) minutes x 3 doses as needed for chest pain.   ofloxacin 0.3 % ophthalmic solution Commonly known as: OCUFLOX Place 1 drop into the right eye 3 (three) times daily.   ticagrelor 90 MG Tabs tablet Commonly known as: BRILINTA Take 1 tablet (90 mg total) by mouth 2 (two) times daily.   TRUEplus Pen Needles 31G X 6 MM  Misc Generic drug: Insulin Pen Needle USE AS DIRECTED   warfarin 5 MG tablet Commonly known as: COUMADIN Take as directed. If you are unsure how to take this medication, talk to your nurse or doctor. Original instructions: Take 5mg  daily except for 7.5mg  every Monday, Wednesday and Friday      Follow-up Information    Azzie Glatter, FNP. Schedule an appointment as soon as possible for a visit in 1 week(s).   Specialty: Family Medicine Contact information: [place] [address] [phone]        [place], [place]. Schedule an appointment as soon as possible for a visit in 2 week(s).   Contact information: [place] [address] [phone]          Allergies  Allergen Reactions  . Ace Inhibitors Cough    Consultations:  Neurology   Procedures/Studies: Dg Chest 2 View  Result Date: 06/15/2019 CLINICAL DATA:  Weakness, loss of appetite EXAM: CHEST - 2 VIEW COMPARISON:  01/23/2019 FINDINGS: Cardiomegaly. No confluent opacities, effusions or edema. No acute bony abnormality. Old healed proximal left humeral fracture. IMPRESSION: Cardiomegaly.  No active disease. Electronically Signed   By: Rolm Baptise M.D.   On: 06/15/2019 19:10   Ct Head Wo Contrast  Result Date: 06/16/2019 CLINICAL DATA:  Altered mental status EXAM: CT HEAD WITHOUT CONTRAST TECHNIQUE: Contiguous axial images were obtained from the base of the skull through the vertex without intravenous contrast. COMPARISON:  CT head dated 09/30/2014 FINDINGS: Brain: No evidence of acute infarction, hemorrhage, hydrocephalus, extra-axial collection or mass lesion/mass effect. Atrophy and chronic microvascular ischemic changes are noted. Vascular: No hyperdense vessel or unexpected calcification. Skull: Normal. Negative for fracture or focal lesion. Sinuses/Orbits: No acute finding. Other: None. IMPRESSION: No acute intracranial abnormality Electronically Signed   By: Constance Holster  M.D.   On: 06/16/2019 03:17   Mr Brain Wo Contrast  Addendum Date: 06/17/2019   ADDENDUM REPORT: 06/17/2019 18:38 ADDENDUM: Upon further review, there are few punctate foci of diffusion weighted hyperintensity within the anterior right corona radiata, some of which demonstrate intermediate signal on the ADC map, and are suspicious for subacute ischemic change. Additionally, there is patchy restricted diffusion with the splenium of the corpus callosum. Although nonspecific, this may reflect a cytotoxic lesion of the corpus callosum. This has a broad differential of causes, one of which is diabetic ketoacidosis. Addended findings called by telephone at the time of interpretation on 06/17/2019 at 6:31 pm to provider Dr. Maylene Roes, who verbally acknowledged these results. Electronically Signed   By: Kellie Simmering DO   On: 06/17/2019 18:38   Result Date: 06/17/2019 CLINICAL DATA:  Encephalopathy. Additional history provided: Altered mental status since Sunday. Found in diabetic ketoacidosis. EXAM: MRI HEAD WITHOUT CONTRAST TECHNIQUE: Multiplanar, multiecho pulse sequences of the brain and surrounding structures were obtained without intravenous contrast. COMPARISON:  Head CT 06/16/2019 FINDINGS: Multiple sequences are significantly motion degraded, limiting evaluation. Most notably, there is moderate motion degradation of the coronal diffusion-weighted imaging, severe motion degradation of the susceptibility weighted imaging, moderate motion degradation of the axial and sagittal T1 weighted imaging. Brain: There is no convincing evidence of acute infarct. No evidence of intracranial mass. No midline shift or extra-axial fluid collection. Severe motion degradation of the susceptibility weighted imaging limits evaluation for chronic intracranial blood products. Redemonstrated chronic lacunar infarct within the right thalamus. There is a background of moderate chronic small vessel ischemic disease. Mild generalized  parenchymal atrophy. Vascular: Flow voids maintained within the proximal large arterial vessels. Skull and upper cervical spine: No focal marrow lesion identified on motion degraded imaging. Sinuses/Orbits: Visualized orbits demonstrate no acute abnormality. Minimal ethmoid sinus mucosal thickening. No significant mastoid effusion. IMPRESSION: 1. Motion degraded and limited examination as described. 2. No evidence of acute intracranial abnormality. 3. Generalized parenchymal atrophy with moderate chronic small vessel ischemic disease. Redemonstrated chronic right thalamic lacunar infarct. Electronically Signed: By: Kellie Simmering DO On: 06/17/2019 12:59   Nm Hepatobiliary Liver Func  Result Date: 06/16/2019 CLINICAL DATA:  Incidentally noted cholelithiasis and gallbladder wall thickening on renal ultrasound. EXAM: NUCLEAR MEDICINE HEPATOBILIARY IMAGING TECHNIQUE: Sequential images of the abdomen were obtained out to 60 minutes following intravenous administration of radiopharmaceutical. RADIOPHARMACEUTICALS:  5.26 mCi Tc-83m  Choletec IV COMPARISON:  Same-day renal ultrasound FINDINGS: Prompt uptake and biliary excretion of activity by the liver is seen. Gallbladder activity is visualized, consistent with patency of cystic duct. Biliary activity passes into small bowel, consistent with patent common bile duct. Some reflux of tracer into the stomach is noted. IMPRESSION: No scintigraphic evidence of acute cholecystitis. Electronically Signed   By: Lovena Le M.D.   On: 06/16/2019 19:47   US Renal  Result Date: 06/16/2019 CLINICAL DATA:  63 year old female with a history of acute kidney injury EXAM: RENAL / URINARY TRACT ULTRASOUND COMPLETE COMPARISON:  None. FINDINGS: Right Kidney: Length: 11.0 cm x 4.3 cm x 4.7 cm, 117 cc. Echogenicity within normal limits. No mass or hydronephrosis visualized. Left Kidney: Length: 10.5 cm x 6.3 cm x 6.4 cm, 221 cc. Echogenicity within normal limits. No mass or  hydronephrosis visualized. Bladder: Bladder partially distended.  Bladder jets not visualized. Additional: Gallbladder demonstrates circumferential gallbladder wall thickening of at least 1 cm. Echogenic material within the gallbladder lumen. Sonographic Murphy's sign not assessed given patient mental status. IMPRESSION: Negative for hydronephrosis. **An incidental finding of potential clinical significance has been found. Circumferential gallbladder wall thickening with potential sludge/stones, may represent incidental finding of cholecystitis. If there is concern for ductal obstruction and acute cholecystitis, recommend correlation with patient presentation, lab values and potentially HIDA study.** Electronically Signed   By: Corrie Mckusick D.O.   On: 06/16/2019 09:54     EEG 11/7 IMPRESSION: This study is suggestive of moderate to severe diffuse encephalopathy, likely but not limited to toxic-metabolic etiology.  No seizures or epileptiform discharges were seen throughout the recording.   Discharge Exam: Vitals:   06/18/19 0843 06/18/19 0918  BP:  (!) 152/70  Pulse:    Resp: 19   Temp:    SpO2:       General: Pt is alert, awake, not in acute distress Cardiovascular:  RRR, S1/S2 +, no edema Respiratory: CTA bilaterally, no wheezing, no rhonchi, no respiratory distress, no conversational dyspnea  Abdominal: Soft, NT, ND, bowel sounds + Extremities: no edema, no cyanosis Psych: Normal mood and affect, stable judgement and insight     The results of significant diagnostics from this hospitalization (including imaging, microbiology, ancillary and laboratory) are listed below for reference.     Microbiology: Recent Results (from the past 240 hour(s))  SARS CORONAVIRUS 2 (TAT 6-24 HRS) Nasopharyngeal Nasopharyngeal Swab     Status: None   Collection Time: 06/16/19 12:10 AM   Specimen: Nasopharyngeal Swab  Result Value Ref Range Status   SARS Coronavirus 2 NEGATIVE NEGATIVE Final     Comment: (NOTE) SARS-CoV-2 target nucleic acids are NOT DETECTED. The SARS-CoV-2 RNA is generally detectable in upper and lower respiratory specimens during the acute phase of infection. Negative results do not preclude SARS-CoV-2 infection, do not rule out co-infections with other pathogens, and should not be used as the sole basis for treatment or other patient management decisions. Negative results must be combined with clinical observations, patient history, and epidemiological information. The expected result is Negative. Fact Sheet for Patients: SugarRoll.be Fact Sheet for Healthcare Providers: https://www.woods-mathews.com/ This test is not yet approved or cleared by the [place] FDA and  has been authorized for detection and/or diagnosis of SARS-CoV-2 by FDA under an Emergency Use Authorization (EUA). This EUA will remain  in effect (meaning this test can be used) for the duration of the COVID-19 declaration under Section 56 4(b)(1) of the Act, 21 U.S.C. section 360bbb-3(b)(1), unless the authorization is terminated or revoked sooner. Performed at [facility], [place] [address], [place], [place] [address]   MRSA PCR Screening     Status: None   Collection Time: 06/16/19  6:26 PM   Specimen: Nasal Mucosa; Nasopharyngeal  Result Value Ref Range Status   MRSA by PCR NEGATIVE NEGATIVE Final    Comment:        The GeneXpert MRSA Assay (FDA approved for NASAL specimens only), is one component of a comprehensive MRSA colonization surveillance program. It is not intended to diagnose MRSA infection nor to guide or monitor treatment for MRSA infections. Performed at [facility], [place] [address], [place], [place] [address]      Labs: BNP (last 3 results) Recent Labs    07/20/18 1347 01/23/19 1715  BNP 918.5* [id number]*   Basic Metabolic Panel: Recent Labs  Lab 06/15/19 1748 06/16/19 0027 06/16/19 0323  06/16/19 0550 06/17/19 0720 06/18/19 0317  NA 136 139 138 139 139 139  K 3.7 3.6 2.8* 3.4* 3.4* 3.7  CL 102  --  107 112* 109 111  CO2 15*  --  16* 16* 14* 15*  GLUCOSE 441*  --  218* 116* 152* 158*  BUN 50*  --  48* 48* 57* 60*  CREATININE 4.63*  --  4.35* 4.38* 4.61* 4.53*  CALCIUM 9.0  --  8.6* 8.8* 8.5* 8.2*   Liver Function Tests: Recent Labs  Lab 06/16/19 0926 06/18/19 0317  AST 250* 100*  ALT 224* 178*  ALKPHOS 109 90  BILITOT 1.4* 1.1  PROT 5.9* 5.3*  ALBUMIN 2.6* 2.3*   No results for input(s): LIPASE, AMYLASE in the last 168 hours. Recent Labs  Lab 06/16/19 0926  AMMONIA 37*   CBC: Recent Labs  Lab 06/15/19 1748 06/16/19 0027 06/18/19 0317  WBC 6.2  --  8.1  HGB 10.0* 10.9* 9.5*  HCT 32.6*  32.0* 30.3*  MCV 80.7  --  79.7*  PLT 306  --  250   Cardiac Enzymes: No results for input(s): CKTOTAL, CKMB, CKMBINDEX, TROPONINI in the last 168 hours. BNP: Invalid input(s): POCBNP CBG: Recent Labs  Lab 06/17/19 1227 06/17/19 1723 06/17/19 2128 06/18/19 0647 06/18/19 0756  GLUCAP 159* 110* 188* 131* 140*   D-Dimer No results for input(s): DDIMER in the last 72 hours. Hgb A1c Recent Labs    06/16/19 0650  HGBA1C 8.7*   Lipid Profile No results for input(s): CHOL, HDL, LDLCALC, TRIG, CHOLHDL, LDLDIRECT in the last 72 hours. Thyroid function studies Recent Labs    06/16/19 0926  TSH 1.650   Anemia work up Recent Labs    06/16/19 0926  VITAMINB12 4,278*   Urinalysis    Component Value Date/Time   COLORURINE YELLOW 07/21/2018 1303   APPEARANCEUR HAZY (A) 07/21/2018 1303   LABSPEC 1.013 07/21/2018 1303   PHURINE 6.0 07/21/2018 1303   GLUCOSEU 150 (A) 07/21/2018 1303   HGBUR SMALL (A) 07/21/2018 1303   BILIRUBINUR Negative 05/16/2019 1114   KETONESUR negative 01/23/2019 [place] 07/21/2018 1303   PROTEINUR Positive (A) 05/16/2019 1114   PROTEINUR >=300 (A) 07/21/2018 1303   UROBILINOGEN 0.2 05/16/2019 1114    UROBILINOGEN 0.2 10/25/2017 0910   NITRITE Negative 05/16/2019 1114   NITRITE NEGATIVE 07/21/2018 1303   LEUKOCYTESUR Negative 05/16/2019 1114   Sepsis Labs Invalid input(s): PROCALCITONIN,  WBC,  LACTICIDVEN Microbiology Recent Results (from the past 240 hour(s))  SARS CORONAVIRUS 2 (TAT 6-24 HRS) Nasopharyngeal Nasopharyngeal Swab     Status: None   Collection Time: 06/16/19 12:10 AM   Specimen: Nasopharyngeal Swab  Result Value Ref Range Status   SARS Coronavirus 2 NEGATIVE NEGATIVE Final    Comment: (NOTE) SARS-CoV-2 target nucleic acids are NOT DETECTED. The SARS-CoV-2 RNA is generally detectable in upper and lower respiratory specimens during the acute phase of infection. Negative results do not preclude SARS-CoV-2 infection, do not rule out co-infections with other pathogens, and should not be used as the sole basis for treatment or other patient management decisions. Negative results must be combined with clinical observations, patient history, and epidemiological information. The expected result is Negative. Fact Sheet for Patients: SugarRoll.be Fact Sheet for Healthcare Providers: https://www.woods-mathews.com/ This test is not yet approved or cleared by the [place] FDA and  has been authorized for detection and/or diagnosis of SARS-CoV-2 by FDA under an Emergency Use Authorization (EUA). This EUA will remain  in effect (meaning this test can be used) for the duration of the COVID-19 declaration under Section 56 4(b)(1) of the Act, 21 U.S.C. section 360bbb-3(b)(1), unless the authorization is terminated or revoked sooner. Performed at [facility], [place] [address], [place], [place] [address]   MRSA PCR Screening     Status: None   Collection Time: 06/16/19  6:26 PM   Specimen: Nasal Mucosa; Nasopharyngeal  Result Value Ref Range Status   MRSA by PCR NEGATIVE NEGATIVE Final    Comment:        The GeneXpert MRSA  Assay (FDA approved for NASAL specimens only), is one component of a comprehensive MRSA colonization surveillance program. It is not intended to diagnose MRSA infection nor to guide or monitor treatment for MRSA infections. Performed at [facility], [place] [address], [place], [place] [address]      Patient was seen and examined on the day of discharge and was found to be in stable  condition. Time coordinating discharge: 25 minutes including assessment and coordination of care, as well as examination of the patient.   SIGNED:  Dessa Phi, DO [facility] 06/18/2019, 11:01 AM

## 2019-06-19 MED FILL — LANTUS SOLOSTAR 100 UNITS/M: 100 | 28 days supply | Qty: 3 | Fill #0

## 2019-06-20 ENCOUNTER — Telehealth: Payer: Self-pay | Admitting: Cardiovascular Disease

## 2019-06-20 DIAGNOSIS — N184 Chronic kidney disease, stage 4 (severe): Secondary | ICD-10-CM | POA: Diagnosis not present

## 2019-06-20 DIAGNOSIS — D631 Anemia in chronic kidney disease: Secondary | ICD-10-CM | POA: Diagnosis not present

## 2019-06-20 DIAGNOSIS — I5042 Chronic combined systolic (congestive) and diastolic (congestive) heart failure: Secondary | ICD-10-CM | POA: Diagnosis not present

## 2019-06-20 DIAGNOSIS — E1110 Type 2 diabetes mellitus with ketoacidosis without coma: Secondary | ICD-10-CM | POA: Diagnosis not present

## 2019-06-20 DIAGNOSIS — I480 Paroxysmal atrial fibrillation: Secondary | ICD-10-CM | POA: Diagnosis not present

## 2019-06-20 DIAGNOSIS — I130 Hypertensive heart and chronic kidney disease with heart failure and stage 1 through stage 4 chronic kidney disease, or unspecified chronic kidney disease: Secondary | ICD-10-CM | POA: Diagnosis not present

## 2019-06-20 DIAGNOSIS — G9341 Metabolic encephalopathy: Secondary | ICD-10-CM | POA: Diagnosis not present

## 2019-06-20 DIAGNOSIS — Z7901 Long term (current) use of anticoagulants: Secondary | ICD-10-CM | POA: Diagnosis not present

## 2019-06-20 DIAGNOSIS — Z8673 Personal history of transient ischemic attack (TIA), and cerebral infarction without residual deficits: Secondary | ICD-10-CM | POA: Diagnosis not present

## 2019-06-20 DIAGNOSIS — Z794 Long term (current) use of insulin: Secondary | ICD-10-CM | POA: Diagnosis not present

## 2019-06-20 DIAGNOSIS — E1122 Type 2 diabetes mellitus with diabetic chronic kidney disease: Secondary | ICD-10-CM | POA: Diagnosis not present

## 2019-06-20 DIAGNOSIS — M6281 Muscle weakness (generalized): Secondary | ICD-10-CM | POA: Diagnosis not present

## 2019-06-20 DIAGNOSIS — I2510 Atherosclerotic heart disease of native coronary artery without angina pectoris: Secondary | ICD-10-CM | POA: Diagnosis not present

## 2019-06-20 NOTE — Telephone Encounter (Signed)
Called and spoke with patient- she advised that she did have swelling in her legs, and some weight gain- I advised that they stopped her medications in the hospital due to her kidney function and she was needed to follow up with nephrologist- she states she seen them last week. I advised patient to contact them to send over note to [place] so we could advise on her medications, as that is what the hospital wanted first before restarting any medications.  Patient verbalized understanding.  Also, advised to elevate feet-continue compression stockings- and monitor salt intake will help with swelling, no other symptoms at this time- other than swelling.  I advised I would route a message to MD to make aware and to primary nurse to be on the lookout for paperwork from kidney doctor.

## 2019-06-20 NOTE — Telephone Encounter (Signed)
  Pamala Hurry, RN with Home Health is calling because the patient is having some swelling in her lower legs and ankles. She also is c/o swelling and tightness in her abdomen. Her weight today was 187.6 lbs. She states that all the medication she was on for fluid control was stopped during her last hospital stay and she has not taken it since. She came home from the hospital on Sunday 06/18/19. Pamala Hurry is requesting that someone call the patient or patient's daughter to follow up.

## 2019-06-20 NOTE — Telephone Encounter (Signed)
Advised patient, verbalized understanding. Patient scheduled for next week and will call back if no improvement

## 2019-06-20 NOTE — Telephone Encounter (Signed)
Please resume lasix 80mg  in the am and 40mg  in the evening. Metolazone today and tomorrow.  She needs to be seen in the next week.  There is no way she can go without diuretics for three weeks as suggested in her hospital discharge summary.

## 2019-06-21 ENCOUNTER — Other Ambulatory Visit: Payer: Self-pay

## 2019-06-21 ENCOUNTER — Ambulatory Visit (INDEPENDENT_AMBULATORY_CARE_PROVIDER_SITE_OTHER): Payer: Medicare Other | Admitting: Pharmacist Clinician (PhC)/ Clinical Pharmacy Specialist

## 2019-06-21 DIAGNOSIS — I480 Paroxysmal atrial fibrillation: Secondary | ICD-10-CM

## 2019-06-21 DIAGNOSIS — Z5181 Encounter for therapeutic drug level monitoring: Secondary | ICD-10-CM

## 2019-06-21 LAB — POCT INR: INR: 2.4 (ref 2.0–3.0)

## 2019-06-27 ENCOUNTER — Other Ambulatory Visit: Payer: Self-pay

## 2019-06-27 DIAGNOSIS — I10 Essential (primary) hypertension: Secondary | ICD-10-CM

## 2019-06-27 DIAGNOSIS — E1159 Type 2 diabetes mellitus with other circulatory complications: Secondary | ICD-10-CM

## 2019-06-27 MED ORDER — TRUEPLUS PEN NEEDLES 31G X 6 MM MISC: 1 refills | Status: AC

## 2019-06-27 MED ORDER — WARFARIN SODIUM 5 MG PO TABS: ORAL_TABLET | ORAL | 1 refills | Status: DC

## 2019-06-27 MED ORDER — FERROUS SULFATE 325 (65 FE) MG PO TABS: 325.0000 mg | ORAL_TABLET | ORAL | 1 refills | Status: AC

## 2019-06-27 MED ORDER — ISOSORBIDE DINITRATE 20 MG PO TABS: 20.0000 mg | ORAL_TABLET | Freq: Three times a day (TID) | ORAL | 1 refills | Status: DC

## 2019-06-27 MED ORDER — ATORVASTATIN CALCIUM 40 MG PO TABS: 40.0000 mg | ORAL_TABLET | Freq: Every day | ORAL | 1 refills | Status: DC

## 2019-06-27 MED ORDER — HYDRALAZINE HCL 100 MG PO TABS: 100.0000 mg | ORAL_TABLET | Freq: Three times a day (TID) | ORAL | 1 refills | Status: DC

## 2019-06-27 MED ORDER — GLIPIZIDE 10 MG PO TABS: 10.0000 mg | ORAL_TABLET | Freq: Two times a day (BID) | ORAL | 1 refills | Status: DC

## 2019-06-27 MED ORDER — NITROGLYCERIN 0.4 MG SL SUBL: 0.4000 mg | SUBLINGUAL_TABLET | SUBLINGUAL | 1 refills | Status: AC | PRN

## 2019-06-27 MED ORDER — AMLODIPINE BESYLATE 10 MG PO TABS: 10.0000 mg | ORAL_TABLET | Freq: Every day | ORAL | 1 refills | Status: DC

## 2019-06-27 MED ORDER — TICAGRELOR 90 MG PO TABS: 90.0000 mg | ORAL_TABLET | Freq: Two times a day (BID) | ORAL | 1 refills | Status: DC

## 2019-06-27 MED ORDER — CARVEDILOL 25 MG PO TABS: 25.0000 mg | ORAL_TABLET | Freq: Two times a day (BID) | ORAL | 1 refills | Status: DC

## 2019-06-27 MED ORDER — INSULIN GLARGINE 100 UNIT/ML SOLOSTAR PEN: 4.0000 [IU] | PEN_INJECTOR | Freq: Every day | SUBCUTANEOUS | 1 refills | Status: AC

## 2019-06-27 NOTE — Progress Notes (Signed)
Cardiology Office Note   Date:  06/29/2019   ID:  Amber Young, DOB 1955-11-15, MRN [id number]  PCP:  Azzie Glatter, FNP Cardiologist:  Skeet Latch, MD 04/20/2019 Electrphysiologist: None Amber Ferries, PA-C 05/20/2018  No chief complaint on file.   History of Present Illness: Amber Young is a 63 y.o. female with a history of HTN, DM, HLD, CVA, PAF on coumadin, GERD, anemia, DES x2 RCA 09/07/2017 w/ med rx for 40% LAD, 2017 LAD stent ok, EF 40-45% by echo 08/2017 w/ grade 3 dd, S-D-CHF, CKD IV (Dr Posey Pronto), hx med noncompliance at times  09/10 office visit, Lasix increased to 120 mg am, 80 mg pm, metolazone that day 09/15 office visit, volume improved, wt 170 lbs. Lasix 80 mg bid, extra dose prn, metolazone 2.5 mg q3d, BUN/Cr 71/4.95 Admit 11/05-11/03/2019 with weakness, DKA>>Insulin gtt, had not been taking meds, losartan/Lasix/metolazone held at d/c, BUN/Cr 60/4.53 at d/c 11/10 phone notes re: edema, wt 187 lbs, resume Lasix at 80 mg am, 40 mg pm, take metolazone x 2 days>>f/u appt made  Amber Young presents for cardiology follow up.  She has been weighing daily on her home scales. The highest weight was 189, that was 2 days ago. She did not take any extra meds, but her weight is down 6 lbs from that.   However, her legs are swollen and tight. They hurt. She was 176 lbs 11/06, is up 6 lbs from then. She has increased DOE.   Her daughter sets up her meds to make sure she takes them.  Her husband, daughter and she do the cooking. They are vigilant about salt, including hidden salt in foods.   She feels very swollen from baseline. She feels no better than she was when she called on 11/10.   She drinks about 1-1/2 bottles of water daily, probably 1/2 liter size and may have some juice.   She describes increased DOE, +orthopnea, +PND.   She has not had chest pain.  CBG this am was 194, higher than usual for her.   No palpitations, no presyncope or  syncope.    Past Medical History:  Diagnosis Date  . Anemia   . Atrial fibrillation ([place]) 06/17/2019  . Chronic combined systolic and diastolic CHF (congestive heart failure) ([place])    a. EF 40-45% in 2019 b. EF 25-30% by repeat echo in 01/2019  . CKD (chronic kidney disease), stage IV ([place])   . Coronary artery disease 08/2017   DES x 2 RCA  . DKA (diabetic ketoacidoses) ([place]) 06/16/2019  . GERD (gastroesophageal reflux disease)   . Heart murmur   . Hyperlipidemia   . Hypertension   . Proliferative diabetic retinopathy ([place])    left eye with vitreous hemorrhage and tractional retinal detachment  . Stroke ([place]) 09/2014   numbness left upper lip, finger tips on left hand; "resolved" (05/18/2016)  . Type II diabetes mellitus ([place])   . Wears glasses     Past Surgical History:  Procedure Laterality Date  . CARDIAC CATHETERIZATION N/A 05/18/2016   Procedure: Left Heart Cath and Coronary Angiography;  Surgeon: Jettie Booze, MD;  Location: [facility];  Service: Cardiovascular;  Laterality: N/A;  . CARDIAC CATHETERIZATION N/A 05/18/2016   Procedure: Coronary Stent Intervention;  Surgeon: Jettie Booze, MD;  Location: [facility];  Service: Cardiovascular;  Laterality: N/A;  . CATARACT EXTRACTION Right 2020  . COLONOSCOPY W/ BIOPSIES AND POLYPECTOMY    . CORONARY ANGIOPLASTY    .  CORONARY ANGIOPLASTY WITH STENT PLACEMENT    . CORONARY STENT INTERVENTION N/A 09/07/2017   Procedure: CORONARY STENT INTERVENTION;  Surgeon: Leonie Man, MD;  Location: [facility];  Service: Cardiovascular;  Laterality: N/A;  . EYE SURGERY    . GAS INSERTION Left 01/13/2018   Procedure: INSERTION OF GAS;  Surgeon: Bernarda Caffey, MD;  Location: [facility];  Service: Ophthalmology;  Laterality: Left;  . LEFT HEART CATH AND CORONARY ANGIOGRAPHY N/A 09/07/2017   Procedure: LEFT HEART CATH AND CORONARY ANGIOGRAPHY;  Surgeon: Leonie Man, MD;  Location: [facility];  Service:  Cardiovascular;  Laterality: N/A;  . MEMBRANE PEEL Left 01/13/2018   Procedure: MEMBRANE PEEL LEFT EYE ;  Surgeon: Bernarda Caffey, MD;  Location: [facility];  Service: Ophthalmology;  Laterality: Left;  Marland Kitchen MULTIPLE TOOTH EXTRACTIONS    . PARS PLANA VITRECTOMY Left 01/13/2018   Procedure: PARS PLANA VITRECTOMY WITH 25 GAUGE LEFT EYE WITH ENDOLASER;  Surgeon: Bernarda Caffey, MD;  Location: [facility];  Service: Ophthalmology;  Laterality: Left;  . TUBAL LIGATION  1980  . VAGINAL HYSTERECTOMY  1999   "partial; fibroids"    Current Outpatient Medications  Medication Sig Dispense Refill  . acetaminophen (TYLENOL) 500 MG tablet Take 1,000 mg by mouth every 6 (six) hours as needed for mild pain or headache.    Marland Kitchen amLODipine (NORVASC) 10 MG tablet Take 1 tablet (10 mg total) by mouth daily. 90 tablet 1  . atorvastatin (LIPITOR) 40 MG tablet Take 1 tablet (40 mg total) by mouth daily at 6 PM. 90 tablet 1  . brimonidine (ALPHAGAN) 0.2 % ophthalmic solution Place 1 drop into the left eye 2 (two) times daily.    . carvedilol (COREG) 25 MG tablet Take 1 tablet (25 mg total) by mouth 2 (two) times daily. 180 tablet 1  . dorzolamide-timolol (COSOPT) 22.3-6.8 MG/ML ophthalmic solution Place 1 drop into the left eye 2 (two) times daily.    . ferrous sulfate (FERROUSUL) 325 (65 FE) MG tablet Take 1 tablet (325 mg total) by mouth every other day. 90 tablet 1  . furosemide (LASIX) 80 MG tablet Take 80 mg by mouth as directed. Take 1 in the morning and 1/2 in the evening    . glipiZIDE (GLUCOTROL) 10 MG tablet Take 1 tablet (10 mg total) by mouth 2 (two) times daily before a meal. 180 tablet 1  . hydrALAZINE (APRESOLINE) 100 MG tablet Take 1 tablet (100 mg total) by mouth 3 (three) times daily. 270 tablet 1  . Insulin Glargine (LANTUS) 100 UNIT/ML Solostar Pen Inject 4 Units into the skin daily. 9 mL 1  . Insulin Pen Needle (TRUEPLUS PEN NEEDLES) 31G X 6 MM MISC USE AS DIRECTED 300 each 1  . isosorbide dinitrate (ISORDIL) 20 MG  tablet Take 1 tablet (20 mg total) by mouth 3 (three) times daily. 270 tablet 1  . ketorolac (ACULAR) 0.4 % SOLN Place 1 drop into the right eye 4 (four) times daily.    . nitroGLYCERIN (NITROSTAT) 0.4 MG SL tablet Place 1 tablet (0.4 mg total) under the tongue every 5 (five) minutes x 3 doses as needed for chest pain. 90 tablet 1  . ofloxacin (OCUFLOX) 0.3 % ophthalmic solution Place 1 drop into the right eye 3 (three) times daily.    . ticagrelor (BRILINTA) 90 MG TABS tablet Take 1 tablet (90 mg total) by mouth 2 (two) times daily. 180 tablet 1  . warfarin (COUMADIN) 5 MG tablet Take  5mg  daily except for 7.5mg  every Monday, Wednesday and Friday 90 tablet 1   No current facility-administered medications for this visit.     Allergies:   Ace inhibitors    Social History:  The patient  reports that she has never smoked. She has never used smokeless tobacco. She reports that she does not drink alcohol or use drugs.   Family History:  The patient's family history includes COPD in her mother; Diabetes in her mother; Heart failure in her mother; Hypertension in her brother, mother, and sister.  She indicated that her mother is alive. She indicated that her father is deceased. She indicated that both of her sisters are alive. She indicated that her brother is alive.  ROS:  Please see the history of present illness. All other systems are reviewed and negative.   PHYSICAL EXAM: VS:  BP (!) 189/88   Pulse 67   Ht 5\' 6"  (1.676 m)   Wt 183 lb 6.4 oz (83.2 kg)   BMI 29.60 kg/m  , BMI Body mass index is 29.6 kg/m. GEN: Well nourished, well developed, female in no acute distress HEENT: normal for age  Neck: JVD 10 cm, +HJR, no carotid bruit, no masses Cardiac: RRR; soft murmur, no rubs, or gallops Respiratory: decreased BS bases bilaterally, normal work of breathing GI: soft, nontender, nondistended, + BS MS: no deformity or atrophy; 2-3+ LE edema; distal pulses are 2+ in upper extremities, cannot  palpate in lower extremities due to edema Skin: warm and dry, no rash Neuro:  Strength and sensation are intact Psych: euthymic mood, full affect   EKG:  EKG is not ordered today. SR by exam body great radiograph ointment for 6 days from today 5 days when asked if presents part of one kind.  She says her daughter is helping with the medication and she reviewed the symptoms but I still do not feel comfortable especially with her creatinine being as high as it is issues that we have been times in the morning I just do not feel comfortable that we can pull fluid off having any other issues and a creatinine of 4.53 she gets 20% 2020 she forgets Clinic she forgets to give her the papers and not know of anybody else in her family not that the daughter will help with her right ask her to in case so daughter has a lot of damage and part of the problem is trying to just take care of 2 people that really should be in the facility just but I already her 0.74 cells given   ECHO: 01/24/2019  1. The left ventricle has severely reduced systolic function, with an ejection fraction of 25-30%. The cavity size was normal. There is mildly increased left ventricular wall thickness. Left ventricular diastolic Doppler parameters are consistent with  pseudonormalization. Left ventricular diffuse hypokinesis.  2. The right ventricle has mildly reduced systolic function. The cavity was mildly enlarged. Right ventricular systolic pressure is moderately elevated with an estimated pressure of 43.3 mmHg.  3. Left atrial size was moderately dilated.  4. Right atrial size was mildly dilated.  5. The mitral valve is grossly normal. Mild thickening of the mitral valve leaflet. There is mild mitral annular calcification present.  6. The tricuspid valve is grossly normal. Tricuspid valve regurgitation is moderate.  7. The aortic valve is tricuspid. Mild thickening of the aortic valve. No stenosis of the aortic valve.  8. Global  hypokinesis with akinesis of the mid septum; overall severe LV  dysfunction; mild LVH; moderate diastolic dysfunction; mild MR; biatrial enlargement; mild RVE with mild RV dysfunction; moderate TR; moderate pulmonary hypertension.  CATH: 09/07/2017  Prox RCA lesion is 75% stenosed.  A drug-eluting stent was successfully placed using a STENT PROMUS PREM MR 2.75X16.  Post intervention, there is a 5% residual stenosis.  Ost RCA lesion is 85% stenosed.  A drug-eluting stent was successfully placed using a STENT PROMUS PREM MR 3.0X12. Overlaps Stent 1 proximally.  Post intervention, there is a 0% residual stenosis.  Mid RCA-1 lesion is 30% stenosed. Mid RCA-2 lesion is 25% stenosed.  Ost 1st Diag lesion is 30% stenosed. Ost 2nd Diag to 2nd Diag lesion is 80% stenosed. Stable.  Prox LAD lesion is 40% stenosed. Previously placed Mid LAD Promus drug eluting stent is widely patent.  There is mild left ventricular systolic dysfunction. The left ventricular ejection fraction is 45-50% by visual estimate.  LV end diastolic pressure is mildly elevated.   Severe Ostial & proximal RCA stenosis (progression of disease from Oct 2017).  Successful PCI using 2 overlapping DES.  Difficult, ostial lesion PCI requiring multiple balloons, buddy wire & extra support wire. Intervention     Recent Labs: 01/23/2019: B Natriuretic Peptide [id number] 01/27/2019: Magnesium 1.9 06/16/2019: TSH 1.650 06/18/2019: ALT 178; BUN 60; Creatinine, Ser 4.53; Hemoglobin 9.5; Platelets 250; Potassium 3.7; Sodium 139  CBC    Component Value Date/Time   WBC 8.1 06/18/2019 0317   RBC 3.80 (L) 06/18/2019 0317   HGB 9.5 (L) 06/18/2019 0317   HGB 8.4 (L) 01/24/2018 1048   HCT 30.3 (L) 06/18/2019 0317   HCT 27.2 (L) 01/24/2018 1048   PLT 250 06/18/2019 0317   PLT 356 01/24/2018 1048   MCV 79.7 (L) 06/18/2019 0317   MCV 78 (L) 01/24/2018 1048   MCH 25.0 (L) 06/18/2019 0317   MCHC 31.4 06/18/2019 0317   RDW 16.9 (H)  06/18/2019 0317   RDW 17.6 (H) 01/24/2018 1048   LYMPHSABS 1.4 07/22/2018 0630   LYMPHSABS 1.6 01/24/2018 1048   MONOABS 0.1 07/22/2018 0630   EOSABS 0.0 07/22/2018 0630   EOSABS 0.1 01/24/2018 1048   BASOSABS 0.0 07/22/2018 0630   BASOSABS 0.0 01/24/2018 1048   CMP Latest Ref Rng & Units 06/18/2019 06/17/2019 06/16/2019  Glucose 70 - 99 mg/dL 158(H) 152(H) -  BUN 8 - 23 mg/dL 60(H) 57(H) -  Creatinine 0.44 - 1.00 mg/dL 4.53(H) 4.61(H) -  Sodium 135 - 145 mmol/L 139 139 -  Potassium 3.5 - 5.1 mmol/L 3.7 3.4(L) -  Chloride 98 - 111 mmol/L 111 109 -  CO2 22 - 32 mmol/L 15(L) 14(L) -  Calcium 8.9 - 10.3 mg/dL 8.2(L) 8.5(L) -  Total Protein 6.5 - 8.1 g/dL 5.3(L) - 5.9(L)  Total Bilirubin 0.3 - 1.2 mg/dL 1.1 - 1.4(H)  Alkaline Phos 38 - 126 U/L 90 - 109  AST 15 - 41 U/L 100(H) - 250(H)  ALT 0 - 44 U/L 178(H) - 224(H)     Lipid Panel Lab Results  Component Value Date   CHOL 217 (H) 01/28/2019   HDL 75 01/28/2019   LDLCALC 130 (H) 01/28/2019   TRIG 59 01/28/2019   CHOLHDL 2.9 01/28/2019      Wt Readings from Last 3 Encounters:  06/29/19 183 lb 6.4 oz (83.2 kg)  06/16/19 176 lb 9.4 oz (80.1 kg)  05/16/19 176 lb 9.6 oz (80.1 kg)     Other studies Reviewed: Additional studies/ records that were reviewed today include: Office notes,  hospital records and testing.  ASSESSMENT AND PLAN:  1.  Acute on chronic systolic CHF:  - Wt is up, sx are worse and it is not clear that she is compliant w/ meds. -Additionally, her creatinine was very high, 4.53 the last time it was checked, at discharge on 11/8. -Dr. Oval Linsey was in to see the patient and agrees that outpatient management is not appropriate. -There are no current beds, she will be sent to the emergency room to await admission.  2. CKD IV - when she was admitted for DKA 11/05-11/08, d/c Cr was 4.53 - her Cr has been > 4 since 04/2019, before that it was in the mid-3s 01/2019, mid2s in 2019 - seen by Nephrology 01/2019, CKD  felt 2nd DM, +proteinuria - suspect she needs to be seen by Nephrology again  3.  Diabetes: -We will start sliding scale insulin in the hospital, if additional management is needed, may have to: Internal medicine consult.  Current medicines are reviewed at length with the patient today.  The patient does not have concerns regarding medicines.  The following changes have been made:  F/u at d/c.  Labs/ tests ordered today include:  No orders of the defined types were placed in this encounter.  Disposition:   FU with Skeet Latch, MD  Signed, Amber Ferries, PA-C  06/29/2019 3:44 PM    [place] Phone: [phone]; Fax: [phone]

## 2019-06-28 ENCOUNTER — Telehealth: Payer: Self-pay | Admitting: Family Medicine

## 2019-06-28 ENCOUNTER — Telehealth: Payer: Self-pay

## 2019-06-28 NOTE — Telephone Encounter (Signed)
  The Physical Therapist needing follow instruction on elevated blood pressure.   Physical Therapist: Atia's blood pressure was 210/100 (no meds taken), now it's 197/98 (after medication).   Per Lanelle Bal recheck blood pressure in 30-45 mins and call back for more  guidance.

## 2019-06-28 NOTE — Telephone Encounter (Signed)
Received call from Home Physical Therapist stating the blood pressures were elevated when she arrived to assess her for home visit today. Patient has just taken BP medication a few minutes prior to PTs arrival. Contacted patient and husband back this afternoon, who state that her last blood pressure reading was 174/76, decreased from 194/88. Patient has not taken 'mid-day' hypertensive medications at this time, as hypertensive medications continue to have dizziness afterwards. Patient denies severe headaches, confusion, seizures, double vision, and blurred vision, nausea and vomiting. Mr.Pracht will continue to She will report to ED if he experiences these symptoms. Patient verbalized understanding.

## 2019-06-29 ENCOUNTER — Ambulatory Visit (INDEPENDENT_AMBULATORY_CARE_PROVIDER_SITE_OTHER): Payer: Medicare Other | Admitting: Pharmacist

## 2019-06-29 ENCOUNTER — Encounter (HOSPITAL_COMMUNITY): Payer: Self-pay | Admitting: *Deleted

## 2019-06-29 ENCOUNTER — Ambulatory Visit (INDEPENDENT_AMBULATORY_CARE_PROVIDER_SITE_OTHER): Payer: Medicare Other | Admitting: Physician Assistant

## 2019-06-29 ENCOUNTER — Emergency Department (HOSPITAL_COMMUNITY): Payer: Medicare Other

## 2019-06-29 ENCOUNTER — Telehealth: Payer: Self-pay | Admitting: Family Medicine

## 2019-06-29 ENCOUNTER — Inpatient Hospital Stay (HOSPITAL_COMMUNITY)
Admission: EM | Admit: 2019-06-29 | Discharge: 2019-07-13 | DRG: 264 | Disposition: A | Discharge status: Home Health | Payer: Medicare Other | Source: Ambulatory Visit | Attending: Internal Medicine | Admitting: Internal Medicine

## 2019-06-29 ENCOUNTER — Other Ambulatory Visit: Payer: Self-pay

## 2019-06-29 ENCOUNTER — Inpatient Hospital Stay: Admission: AD | Admit: 2019-06-29 | Payer: Medicare Other | Source: Ambulatory Visit | Admitting: Cardiovascular Disease

## 2019-06-29 ENCOUNTER — Encounter: Payer: Self-pay | Admitting: Physician Assistant

## 2019-06-29 VITALS — BP 189/88 | HR 67 | Ht 66.0 in | Wt 183.4 lb

## 2019-06-29 DIAGNOSIS — Z5181 Encounter for therapeutic drug level monitoring: Secondary | ICD-10-CM | POA: Diagnosis not present

## 2019-06-29 DIAGNOSIS — I480 Paroxysmal atrial fibrillation: Secondary | ICD-10-CM

## 2019-06-29 DIAGNOSIS — E440 Moderate protein-calorie malnutrition: Secondary | ICD-10-CM | POA: Insufficient documentation

## 2019-06-29 DIAGNOSIS — I340 Nonrheumatic mitral (valve) insufficiency: Secondary | ICD-10-CM | POA: Diagnosis not present

## 2019-06-29 DIAGNOSIS — D631 Anemia in chronic kidney disease: Secondary | ICD-10-CM | POA: Diagnosis present

## 2019-06-29 DIAGNOSIS — N184 Chronic kidney disease, stage 4 (severe): Secondary | ICD-10-CM | POA: Diagnosis not present

## 2019-06-29 DIAGNOSIS — I10 Essential (primary) hypertension: Secondary | ICD-10-CM | POA: Diagnosis not present

## 2019-06-29 DIAGNOSIS — Z8673 Personal history of transient ischemic attack (TIA), and cerebral infarction without residual deficits: Secondary | ICD-10-CM | POA: Diagnosis not present

## 2019-06-29 DIAGNOSIS — I361 Nonrheumatic tricuspid (valve) insufficiency: Secondary | ICD-10-CM | POA: Diagnosis not present

## 2019-06-29 DIAGNOSIS — I4891 Unspecified atrial fibrillation: Secondary | ICD-10-CM | POA: Diagnosis not present

## 2019-06-29 DIAGNOSIS — I071 Rheumatic tricuspid insufficiency: Secondary | ICD-10-CM | POA: Diagnosis present

## 2019-06-29 DIAGNOSIS — Z992 Dependence on renal dialysis: Secondary | ICD-10-CM

## 2019-06-29 DIAGNOSIS — E113599 Type 2 diabetes mellitus with proliferative diabetic retinopathy without macular edema, unspecified eye: Secondary | ICD-10-CM | POA: Diagnosis present

## 2019-06-29 DIAGNOSIS — Z7902 Long term (current) use of antithrombotics/antiplatelets: Secondary | ICD-10-CM

## 2019-06-29 DIAGNOSIS — Z20828 Contact with and (suspected) exposure to other viral communicable diseases: Secondary | ICD-10-CM | POA: Diagnosis present

## 2019-06-29 DIAGNOSIS — E785 Hyperlipidemia, unspecified: Secondary | ICD-10-CM | POA: Diagnosis present

## 2019-06-29 DIAGNOSIS — N186 End stage renal disease: Secondary | ICD-10-CM

## 2019-06-29 DIAGNOSIS — Z955 Presence of coronary angioplasty implant and graft: Secondary | ICD-10-CM | POA: Diagnosis not present

## 2019-06-29 DIAGNOSIS — I5042 Chronic combined systolic (congestive) and diastolic (congestive) heart failure: Secondary | ICD-10-CM | POA: Diagnosis present

## 2019-06-29 DIAGNOSIS — Z7901 Long term (current) use of anticoagulants: Secondary | ICD-10-CM

## 2019-06-29 DIAGNOSIS — R0602 Shortness of breath: Secondary | ICD-10-CM | POA: Diagnosis not present

## 2019-06-29 DIAGNOSIS — N185 Chronic kidney disease, stage 5: Secondary | ICD-10-CM

## 2019-06-29 DIAGNOSIS — Z833 Family history of diabetes mellitus: Secondary | ICD-10-CM

## 2019-06-29 DIAGNOSIS — I132 Hypertensive heart and chronic kidney disease with heart failure and with stage 5 chronic kidney disease, or end stage renal disease: Secondary | ICD-10-CM | POA: Diagnosis not present

## 2019-06-29 DIAGNOSIS — I2510 Atherosclerotic heart disease of native coronary artery without angina pectoris: Secondary | ICD-10-CM | POA: Diagnosis not present

## 2019-06-29 DIAGNOSIS — Z419 Encounter for procedure for purposes other than remedying health state, unspecified: Secondary | ICD-10-CM

## 2019-06-29 DIAGNOSIS — Z9889 Other specified postprocedural states: Secondary | ICD-10-CM

## 2019-06-29 DIAGNOSIS — G9341 Metabolic encephalopathy: Secondary | ICD-10-CM | POA: Diagnosis present

## 2019-06-29 DIAGNOSIS — Z8249 Family history of ischemic heart disease and other diseases of the circulatory system: Secondary | ICD-10-CM | POA: Diagnosis not present

## 2019-06-29 DIAGNOSIS — I5023 Acute on chronic systolic (congestive) heart failure: Secondary | ICD-10-CM | POA: Diagnosis not present

## 2019-06-29 DIAGNOSIS — D638 Anemia in other chronic diseases classified elsewhere: Secondary | ICD-10-CM | POA: Diagnosis not present

## 2019-06-29 DIAGNOSIS — E1122 Type 2 diabetes mellitus with diabetic chronic kidney disease: Secondary | ICD-10-CM | POA: Diagnosis present

## 2019-06-29 DIAGNOSIS — N179 Acute kidney failure, unspecified: Secondary | ICD-10-CM | POA: Diagnosis present

## 2019-06-29 DIAGNOSIS — I5043 Acute on chronic combined systolic (congestive) and diastolic (congestive) heart failure: Secondary | ICD-10-CM | POA: Diagnosis not present

## 2019-06-29 DIAGNOSIS — I4811 Longstanding persistent atrial fibrillation: Secondary | ICD-10-CM | POA: Diagnosis not present

## 2019-06-29 DIAGNOSIS — Z794 Long term (current) use of insulin: Secondary | ICD-10-CM

## 2019-06-29 DIAGNOSIS — Z4901 Encounter for fitting and adjustment of extracorporeal dialysis catheter: Secondary | ICD-10-CM | POA: Diagnosis not present

## 2019-06-29 DIAGNOSIS — Z79899 Other long term (current) drug therapy: Secondary | ICD-10-CM

## 2019-06-29 DIAGNOSIS — I2583 Coronary atherosclerosis due to lipid rich plaque: Secondary | ICD-10-CM | POA: Diagnosis not present

## 2019-06-29 DIAGNOSIS — R58 Hemorrhage, not elsewhere classified: Secondary | ICD-10-CM | POA: Diagnosis not present

## 2019-06-29 DIAGNOSIS — N189 Chronic kidney disease, unspecified: Secondary | ICD-10-CM | POA: Diagnosis not present

## 2019-06-29 DIAGNOSIS — Z825 Family history of asthma and other chronic lower respiratory diseases: Secondary | ICD-10-CM | POA: Diagnosis not present

## 2019-06-29 DIAGNOSIS — E118 Type 2 diabetes mellitus with unspecified complications: Secondary | ICD-10-CM | POA: Diagnosis not present

## 2019-06-29 DIAGNOSIS — Z452 Encounter for adjustment and management of vascular access device: Secondary | ICD-10-CM | POA: Diagnosis not present

## 2019-06-29 DIAGNOSIS — I5033 Acute on chronic diastolic (congestive) heart failure: Secondary | ICD-10-CM | POA: Diagnosis not present

## 2019-06-29 LAB — COMPREHENSIVE METABOLIC PANEL
ALT: 31 U/L (ref 0–44)
AST: 20 U/L (ref 15–41)
Albumin: 2.7 g/dL — ABNORMAL LOW (ref 3.5–5.0)
Alkaline Phosphatase: 104 U/L (ref 38–126)
Anion gap: 9 (ref 5–15)
BUN: 59 mg/dL — ABNORMAL HIGH (ref 8–23)
CO2: 23 mmol/L (ref 22–32)
Calcium: 8.6 mg/dL — ABNORMAL LOW (ref 8.9–10.3)
Chloride: 103 mmol/L (ref 98–111)
Creatinine, Ser: 4.32 mg/dL — ABNORMAL HIGH (ref 0.44–1.00)
GFR calc Af Amer: 12 mL/min — ABNORMAL LOW (ref >60)
GFR calc non Af Amer: 10 mL/min — ABNORMAL LOW (ref >60)
Glucose, Bld: 197 mg/dL — ABNORMAL HIGH (ref 70–99)
Potassium: 4.2 mmol/L (ref 3.5–5.1)
Sodium: 135 mmol/L (ref 135–145)
Total Bilirubin: 0.6 mg/dL (ref 0.3–1.2)
Total Protein: 6.3 g/dL — ABNORMAL LOW (ref 6.5–8.1)

## 2019-06-29 LAB — HEMOGLOBIN A1C
Hgb A1c MFr Bld: 8.9 % — ABNORMAL HIGH (ref 4.8–5.6)
Mean Plasma Glucose: 208.73 mg/dL

## 2019-06-29 LAB — CBG MONITORING, ED: Glucose-Capillary: 165 mg/dL — ABNORMAL HIGH (ref 70–99)

## 2019-06-29 LAB — CBC WITH DIFFERENTIAL/PLATELET
Abs Immature Granulocytes: 0.01 10*3/uL (ref 0.00–0.07)
Basophils Absolute: 0 10*3/uL (ref 0.0–0.1)
Basophils Relative: 0 %
Eosinophils Absolute: 0.1 10*3/uL (ref 0.0–0.5)
Eosinophils Relative: 1 %
HCT: 32.8 % — ABNORMAL LOW (ref 36.0–46.0)
Hemoglobin: 10.2 g/dL — ABNORMAL LOW (ref 12.0–15.0)
Immature Granulocytes: 0 %
Lymphocytes Relative: 19 %
Lymphs Abs: 0.9 10*3/uL (ref 0.7–4.0)
MCH: 24.1 pg — ABNORMAL LOW (ref 26.0–34.0)
MCHC: 31.1 g/dL (ref 30.0–36.0)
MCV: 77.5 fL — ABNORMAL LOW (ref 80.0–100.0)
Monocytes Absolute: 0.6 10*3/uL (ref 0.1–1.0)
Monocytes Relative: 11 %
Neutro Abs: 3.3 10*3/uL (ref 1.7–7.7)
Neutrophils Relative %: 69 %
Platelets: 334 10*3/uL (ref 150–400)
RBC: 4.23 MIL/uL (ref 3.87–5.11)
RDW: 16.7 % — ABNORMAL HIGH (ref 11.5–15.5)
WBC: 4.9 10*3/uL (ref 4.0–10.5)
nRBC: 0 % (ref 0.0–0.2)

## 2019-06-29 LAB — BRAIN NATRIURETIC PEPTIDE: B Natriuretic Peptide: 2340.4 pg/mL — ABNORMAL HIGH (ref 0.0–100.0)

## 2019-06-29 LAB — POCT INR: INR: 1.1 — AB (ref 2.0–3.0)

## 2019-06-29 LAB — TSH: TSH: 1.162 u[IU]/mL (ref 0.350–4.500)

## 2019-06-29 LAB — APTT: aPTT: 29 seconds (ref 24–36)

## 2019-06-29 MED ORDER — ISOSORBIDE DINITRATE 10 MG PO TABS
20.0000 mg | ORAL_TABLET | Freq: Three times a day (TID) | ORAL | Status: DC
  Administered 2019-06-29 – 2019-07-01 (×5): 20 mg via ORAL
  Filled 2019-06-29: qty 1
  Filled 2019-06-29 (×2): qty 2
  Filled 2019-06-29: qty 1
  Filled 2019-06-29 (×2): qty 2

## 2019-06-29 MED ORDER — INSULIN GLARGINE 100 UNIT/ML ~~LOC~~ SOLN
4.0000 [IU] | Freq: Every day | SUBCUTANEOUS | Status: DC
  Administered 2019-06-29 – 2019-06-30 (×2): 4 [IU] via SUBCUTANEOUS
  Filled 2019-06-29 (×2): qty 0.04

## 2019-06-29 MED ORDER — BRIMONIDINE TARTRATE 0.2 % OP SOLN
1.0000 [drp] | Freq: Two times a day (BID) | OPHTHALMIC | Status: DC
  Administered 2019-06-30 – 2019-07-13 (×24): 1 [drp] via OPHTHALMIC
  Filled 2019-06-29 (×2): qty 5

## 2019-06-29 MED ORDER — ALPRAZOLAM 0.25 MG PO TABS
0.2500 mg | ORAL_TABLET | Freq: Two times a day (BID) | ORAL | Status: DC | PRN
  Administered 2019-07-11 – 2019-07-12 (×2): 0.25 mg via ORAL
  Filled 2019-06-29 (×2): qty 1

## 2019-06-29 MED ORDER — CARVEDILOL 25 MG PO TABS
25.0000 mg | ORAL_TABLET | Freq: Two times a day (BID) | ORAL | Status: DC
  Administered 2019-06-29 – 2019-07-13 (×23): 25 mg via ORAL
  Filled 2019-06-29 (×27): qty 1

## 2019-06-29 MED ORDER — WARFARIN SODIUM 10 MG PO TABS
10.0000 mg | ORAL_TABLET | Freq: Once | ORAL | Status: AC
  Administered 2019-06-29: 10 mg via ORAL
  Filled 2019-06-29: qty 1

## 2019-06-29 MED ORDER — AMLODIPINE BESYLATE 10 MG PO TABS
10.0000 mg | ORAL_TABLET | Freq: Every day | ORAL | Status: DC
  Administered 2019-06-30 – 2019-07-13 (×12): 10 mg via ORAL
  Filled 2019-06-29 (×15): qty 1

## 2019-06-29 MED ORDER — ACETAMINOPHEN 325 MG PO TABS
650.0000 mg | ORAL_TABLET | ORAL | Status: DC | PRN
  Administered 2019-07-05 – 2019-07-06 (×3): 650 mg via ORAL
  Filled 2019-06-29 (×3): qty 2

## 2019-06-29 MED ORDER — FERROUS SULFATE 325 (65 FE) MG PO TABS
325.0000 mg | ORAL_TABLET | ORAL | Status: DC
  Administered 2019-06-30 – 2019-07-12 (×6): 325 mg via ORAL
  Filled 2019-06-29 (×9): qty 1

## 2019-06-29 MED ORDER — HYDRALAZINE HCL 50 MG PO TABS
100.0000 mg | ORAL_TABLET | Freq: Three times a day (TID) | ORAL | Status: DC
  Administered 2019-06-29 – 2019-07-13 (×36): 100 mg via ORAL
  Filled 2019-06-29 (×40): qty 2

## 2019-06-29 MED ORDER — NITROGLYCERIN 0.4 MG SL SUBL: 0.4000 mg | SUBLINGUAL_TABLET | SUBLINGUAL | Status: DC | PRN

## 2019-06-29 MED ORDER — WARFARIN - PHARMACIST DOSING INPATIENT
Freq: Every day | Status: DC
  Administered 2019-06-29 – 2019-07-01 (×3)

## 2019-06-29 MED ORDER — DORZOLAMIDE HCL-TIMOLOL MAL 2-0.5 % OP SOLN
1.0000 [drp] | Freq: Two times a day (BID) | OPHTHALMIC | Status: DC
  Administered 2019-06-30 – 2019-07-13 (×24): 1 [drp] via OPHTHALMIC
  Filled 2019-06-29 (×3): qty 10

## 2019-06-29 MED ORDER — HEPARIN BOLUS VIA INFUSION
4000.0000 [IU] | Freq: Once | INTRAVENOUS | Status: AC
  Administered 2019-06-29: 4000 [IU] via INTRAVENOUS
  Filled 2019-06-29: qty 4000

## 2019-06-29 MED ORDER — ZOLPIDEM TARTRATE 5 MG PO TABS: 5.0000 mg | ORAL_TABLET | Freq: Every evening | ORAL | Status: DC | PRN

## 2019-06-29 MED ORDER — ATORVASTATIN CALCIUM 40 MG PO TABS
40.0000 mg | ORAL_TABLET | Freq: Every day | ORAL | Status: DC
  Administered 2019-06-29 – 2019-07-12 (×14): 40 mg via ORAL
  Filled 2019-06-29 (×15): qty 1

## 2019-06-29 MED ORDER — FUROSEMIDE 10 MG/ML IJ SOLN
80.0000 mg | Freq: Two times a day (BID) | INTRAMUSCULAR | Status: DC
  Administered 2019-06-29 – 2019-07-01 (×3): 80 mg via INTRAVENOUS
  Filled 2019-06-29 (×4): qty 8

## 2019-06-29 MED ORDER — INSULIN ASPART 100 UNIT/ML ~~LOC~~ SOLN: 0.0000 [IU] | Freq: Every day | SUBCUTANEOUS | Status: DC

## 2019-06-29 MED ORDER — TICAGRELOR 90 MG PO TABS
90.0000 mg | ORAL_TABLET | Freq: Two times a day (BID) | ORAL | Status: DC
  Administered 2019-06-29 – 2019-06-30 (×2): 90 mg via ORAL
  Filled 2019-06-29 (×2): qty 1

## 2019-06-29 MED ORDER — HEPARIN (PORCINE) 25000 UT/250ML-% IV SOLN
950.0000 [IU]/h | INTRAVENOUS | Status: DC
  Administered 2019-06-29: 1150 [IU]/h via INTRAVENOUS
  Administered 2019-06-30 – 2019-07-01 (×2): 950 [IU]/h via INTRAVENOUS
  Filled 2019-06-29 (×3): qty 250

## 2019-06-29 MED ORDER — ONDANSETRON HCL 4 MG/2ML IJ SOLN: 4.0000 mg | Freq: Four times a day (QID) | INTRAMUSCULAR | Status: DC | PRN

## 2019-06-29 MED ORDER — OFLOXACIN 0.3 % OP SOLN
1.0000 [drp] | Freq: Three times a day (TID) | OPHTHALMIC | Status: DC
  Administered 2019-06-30 – 2019-07-13 (×35): 1 [drp] via OPHTHALMIC
  Filled 2019-06-29: qty 5

## 2019-06-29 MED ORDER — INSULIN ASPART 100 UNIT/ML ~~LOC~~ SOLN: 0.0000 [IU] | Freq: Three times a day (TID) | SUBCUTANEOUS | Status: DC

## 2019-06-29 MED ORDER — KETOROLAC TROMETHAMINE 0.5 % OP SOLN
1.0000 [drp] | Freq: Four times a day (QID) | OPHTHALMIC | Status: DC
  Administered 2019-06-30 – 2019-07-13 (×43): 1 [drp] via OPHTHALMIC
  Filled 2019-06-29: qty 5

## 2019-06-29 MED ORDER — GLIPIZIDE 10 MG PO TABS
10.0000 mg | ORAL_TABLET | Freq: Two times a day (BID) | ORAL | Status: DC
  Administered 2019-06-30: 10 mg via ORAL
  Filled 2019-06-29 (×2): qty 1

## 2019-06-29 NOTE — Progress Notes (Addendum)
ANTICOAGULATION CONSULT NOTE - Initial Consult  Pharmacy Consult for warfarin and heparin Indication: History of atrial fibrillation on warfarin PTA with subtherapeutic INR on admission   Allergies  Allergen Reactions  . Ace Inhibitors Cough    Patient Measurements:   Heparin Dosing Weight: 76.8 kg   Vital Signs: Temp: 98.6 F (37 C) (11/19 1640) Temp Source: Oral (11/19 1640) BP: 198/92 (11/19 1640) Pulse Rate: 64 (11/19 1640)  Labs: Recent Labs    06/29/19 1542 06/29/19 1648  HGB  --  10.2*  HCT  --  32.8*  PLT  --  334  INR 1.1*  --   CREATININE  --  4.32*    Estimated Creatinine Clearance: 14.5 mL/min (A) (by C-G formula based on SCr of 4.32 mg/dL (H)).   Medical History: Past Medical History:  Diagnosis Date  . Anemia   . Atrial fibrillation ([place]) 06/17/2019  . Chronic combined systolic and diastolic CHF (congestive heart failure) ([place])    a. EF 40-45% in 2019 b. EF 25-30% by repeat echo in 01/2019  . CKD (chronic kidney disease), stage IV ([place])   . Coronary artery disease 08/2017   DES x 2 RCA  . DKA (diabetic ketoacidoses) ([place]) 06/16/2019  . GERD (gastroesophageal reflux disease)   . Heart murmur   . Hyperlipidemia   . Hypertension   . Proliferative diabetic retinopathy ([place])    left eye with vitreous hemorrhage and tractional retinal detachment  . Stroke ([place]) 09/2014   numbness left upper lip, finger tips on left hand; "resolved" (05/18/2016)  . Type II diabetes mellitus ([place])   . Wears glasses     Medications:  (Not in a hospital admission)  Scheduled:  . amLODipine  10 mg Oral Daily  . atorvastatin  40 mg Oral q1800  . brimonidine  1 drop Left Eye BID  . carvedilol  25 mg Oral BID  . dorzolamide-timolol  1 drop Left Eye BID  . [START ON 06/30/2019] ferrous sulfate  325 mg Oral QODAY  . furosemide  80 mg Intravenous BID  . glipiZIDE  10 mg Oral BID AC  . hydrALAZINE  100 mg Oral TID  . insulin aspart  0-5 Units Subcutaneous QHS  .  insulin aspart  0-9 Units Subcutaneous TID WC  . insulin glargine  4 Units Subcutaneous Daily  . isosorbide dinitrate  20 mg Oral TID  . ketorolac  1 drop Right Eye QID  . ofloxacin  1 drop Right Eye TID  . ticagrelor  90 mg Oral BID  . warfarin  10 mg Oral ONCE-1800  . Warfarin - Pharmacist Dosing Inpatient   Does not apply q1800    Assessment: 63 yo female presented on 06/29/2019 with SOB and CHF exacerbation. Patient has a history of Afib and is on warfarin PTA. Pharmacy consulted to dose warfarin for Afib and heparin while INR is subtherapeutic. INR of 1.1 on admission is subtherapeutic. Patient's daughter reports last dose of warfarin 7.5mg  on 11/18. CHAD2-VASc score of 7. Hgb 10.2. Plt 334. No reported bleeding.   Warfarin PTA regimen: warfarin 7.5mg  on Mondays, Wednesdays, and Fridays and warfarin 5mg  all other days.  Goal of Therapy:  INR 2-3 Heparin level 0.3-0.7 units/ml Monitor platelets by anticoagulation protocol: Yes   Plan:  Warfarin 10mg  x1 on 11/19 Heparin 4000 units x1  Heparin 1150 units/hr  Check heparin level at 0230 on 11/20 Monitor heparin level, CBC, and S/S of bleeding daily   Cristela Felt, PharmD PGY1 Pharmacy Resident  [facility]: [phone]  06/29/2019,5:54 PM

## 2019-06-29 NOTE — Patient Instructions (Addendum)
Please go to [facility] Emergency Department for Admission.

## 2019-06-29 NOTE — ED Provider Notes (Signed)
This patient is a direct admission per the cardiology team.  She is currently boarding in the emergency department as there are no available beds in the hospital at this moment.  She has already been seen and evaluated by the cardiology team Rosaria Ferries, PA, Dr Skeet Latch, MD) and is under their primary care and management at this time.   Wyvonnia Dusky, MD 06/29/19 [phone]

## 2019-06-29 NOTE — Patient Instructions (Signed)
*  USE WARFARIN 5mg  TABLETS ONLY* Take 2 tables today (11/19) and 2 tablets tomorrow (11/20), then continue with 1.5 tablets daily except 1 tablet on Tuesdays, Thursdays and Saturdays.  Repeat IN in 1 week

## 2019-06-29 NOTE — ED Triage Notes (Signed)
Pt here for ongoing shob both at rest and with exertion for several months. Pt went to Coumadin clinic today and they sent her here for evaluation of shob.

## 2019-06-29 NOTE — H&P (Addendum)
Cardiology History and Physical   Date:  06/29/2019   ID:  Derek Biermann, DOB 1955/09/21, MRN [id number]  PCP:  Azzie Glatter, FNP Cardiologist:  Skeet Latch, MD 04/20/2019 Electrphysiologist: None Rosaria Ferries, PA-C 05/20/2018  Chief complaint: Shortness of breath, CHF exacerbation   History of Present Illness: Amber Young is a 63 y.o. female with a history of HTN, DM, HLD, CVA, PAF on coumadin, GERD, anemia, DES x2 RCA 09/07/2017 w/ med rx for 40% LAD, 2017 LAD stent ok, EF 40-45% by echo 08/2017 w/ grade 3 dd, S-D-CHF, CKD IV (Dr Posey Pronto), hx med noncompliance at times  09/10 office visit, Lasix increased to 120 mg am, 80 mg pm, metolazone that day 09/15 office visit, volume improved, wt 170 lbs. Lasix 80 mg bid, extra dose prn, metolazone 2.5 mg q3d, BUN/Cr 71/4.95 Admit 11/05-11/03/2019 with weakness, DKA>>Insulin gtt, had not been taking meds, losartan/Lasix/metolazone held at d/c, BUN/Cr 60/4.53 at d/c 11/10 phone notes re: edema, wt 187 lbs, resume Lasix at 80 mg am, 40 mg pm, take metolazone x 2 days>>f/u appt made  [name] presents for cardiology follow up.  She has been weighing daily on her home scales. The highest weight was 189, that was 2 days ago. She did not take any extra meds, but her weight is down 6 lbs from that.   However, her legs are swollen and tight. They hurt. She was 176 lbs 11/06, is up 6 lbs from then. She has increased DOE.   Her daughter sets up her meds to make sure she takes them.  Her husband, daughter and she do the cooking. They are vigilant about salt, including hidden salt in foods.   She feels very swollen from baseline. She feels no better than she was when she called on 11/10.   She drinks about 1-1/2 bottles of water daily, probably 1/2 liter size and may have some juice.   She describes increased DOE, +orthopnea, +PND.   She has not had chest pain.  CBG this am was 194, higher than usual for her.   No  palpitations, no presyncope or syncope.    Past Medical History:  Diagnosis Date  . Anemia   . Atrial fibrillation ([place]) 06/17/2019  . Chronic combined systolic and diastolic CHF (congestive heart failure) ([place])    a. EF 40-45% in 2019 b. EF 25-30% by repeat echo in 01/2019  . CKD (chronic kidney disease), stage IV ([place])   . Coronary artery disease 08/2017   DES x 2 RCA  . DKA (diabetic ketoacidoses) ([place]) 06/16/2019  . GERD (gastroesophageal reflux disease)   . Heart murmur   . Hyperlipidemia   . Hypertension   . Proliferative diabetic retinopathy ([place])    left eye with vitreous hemorrhage and tractional retinal detachment  . Stroke ([place]) 09/2014   numbness left upper lip, finger tips on left hand; "resolved" (05/18/2016)  . Type II diabetes mellitus ([place])   . Wears glasses     Past Surgical History:  Procedure Laterality Date  . CARDIAC CATHETERIZATION N/A 05/18/2016   Procedure: Left Heart Cath and Coronary Angiography;  Surgeon: Jettie Booze, MD;  Location: [facility];  Service: Cardiovascular;  Laterality: N/A;  . CARDIAC CATHETERIZATION N/A 05/18/2016   Procedure: Coronary Stent Intervention;  Surgeon: Jettie Booze, MD;  Location: [facility];  Service: Cardiovascular;  Laterality: N/A;  . CATARACT EXTRACTION Right 2020  . COLONOSCOPY W/ BIOPSIES AND POLYPECTOMY    .  CORONARY ANGIOPLASTY    . CORONARY ANGIOPLASTY WITH STENT PLACEMENT    . CORONARY STENT INTERVENTION N/A 09/07/2017   Procedure: CORONARY STENT INTERVENTION;  Surgeon: Leonie Man, MD;  Location: [facility];  Service: Cardiovascular;  Laterality: N/A;  . EYE SURGERY    . GAS INSERTION Left 01/13/2018   Procedure: INSERTION OF GAS;  Surgeon: Bernarda Caffey, MD;  Location: [facility];  Service: Ophthalmology;  Laterality: Left;  . LEFT HEART CATH AND CORONARY ANGIOGRAPHY N/A 09/07/2017   Procedure: LEFT HEART CATH AND CORONARY ANGIOGRAPHY;  Surgeon: Leonie Man, MD;  Location:  [facility];  Service: Cardiovascular;  Laterality: N/A;  . MEMBRANE PEEL Left 01/13/2018   Procedure: MEMBRANE PEEL LEFT EYE ;  Surgeon: Bernarda Caffey, MD;  Location: [facility];  Service: Ophthalmology;  Laterality: Left;  Marland Kitchen MULTIPLE TOOTH EXTRACTIONS    . PARS PLANA VITRECTOMY Left 01/13/2018   Procedure: PARS PLANA VITRECTOMY WITH 25 GAUGE LEFT EYE WITH ENDOLASER;  Surgeon: Bernarda Caffey, MD;  Location: [facility];  Service: Ophthalmology;  Laterality: Left;  . TUBAL LIGATION  1980  . VAGINAL HYSTERECTOMY  1999   "partial; fibroids"    Current Outpatient Medications  Medication Sig Dispense Refill  . acetaminophen (TYLENOL) 500 MG tablet Take 1,000 mg by mouth every 6 (six) hours as needed for mild pain or headache.    Marland Kitchen amLODipine (NORVASC) 10 MG tablet Take 1 tablet (10 mg total) by mouth daily. 90 tablet 1  . atorvastatin (LIPITOR) 40 MG tablet Take 1 tablet (40 mg total) by mouth daily at 6 PM. 90 tablet 1  . brimonidine (ALPHAGAN) 0.2 % ophthalmic solution Place 1 drop into the left eye 2 (two) times daily.    . carvedilol (COREG) 25 MG tablet Take 1 tablet (25 mg total) by mouth 2 (two) times daily. 180 tablet 1  . dorzolamide-timolol (COSOPT) 22.3-6.8 MG/ML ophthalmic solution Place 1 drop into the left eye 2 (two) times daily.    . ferrous sulfate (FERROUSUL) 325 (65 FE) MG tablet Take 1 tablet (325 mg total) by mouth every other day. 90 tablet 1  . furosemide (LASIX) 80 MG tablet Take 80 mg by mouth as directed. Take 1 in the morning and 1/2 in the evening    . glipiZIDE (GLUCOTROL) 10 MG tablet Take 1 tablet (10 mg total) by mouth 2 (two) times daily before a meal. 180 tablet 1  . hydrALAZINE (APRESOLINE) 100 MG tablet Take 1 tablet (100 mg total) by mouth 3 (three) times daily. 270 tablet 1  . Insulin Glargine (LANTUS) 100 UNIT/ML Solostar Pen Inject 4 Units into the skin daily. 9 mL 1  . Insulin Pen Needle (TRUEPLUS PEN NEEDLES) 31G X 6 MM MISC USE AS DIRECTED 300 each 1  .  isosorbide dinitrate (ISORDIL) 20 MG tablet Take 1 tablet (20 mg total) by mouth 3 (three) times daily. 270 tablet 1  . ketorolac (ACULAR) 0.4 % SOLN Place 1 drop into the right eye 4 (four) times daily.    . nitroGLYCERIN (NITROSTAT) 0.4 MG SL tablet Place 1 tablet (0.4 mg total) under the tongue every 5 (five) minutes x 3 doses as needed for chest pain. 90 tablet 1  . ofloxacin (OCUFLOX) 0.3 % ophthalmic solution Place 1 drop into the right eye 3 (three) times daily.    . ticagrelor (BRILINTA) 90 MG TABS tablet Take 1 tablet (90 mg total) by mouth 2 (two) times daily. 180 tablet 1  .  warfarin (COUMADIN) 5 MG tablet Take 5mg  daily except for 7.5mg  every Monday, Wednesday and Friday 90 tablet 1   No current facility-administered medications for this visit.     Allergies:   Ace inhibitors    Social History:  The patient  reports that she has never smoked. She has never used smokeless tobacco. She reports that she does not drink alcohol or use drugs.   Family History:  The patient's family history includes COPD in her mother; Diabetes in her mother; Heart failure in her mother; Hypertension in her brother, mother, and sister.  She indicated that her mother is alive. She indicated that her father is deceased. She indicated that both of her sisters are alive. She indicated that her brother is alive.  ROS:  Please see the history of present illness. All other systems are reviewed and negative.   PHYSICAL EXAM: VS:  BP (!) 189/88   Pulse 67   Ht 5\' 6"  (1.676 m)   Wt 183 lb 6.4 oz (83.2 kg)   BMI 29.60 kg/m  , BMI Body mass index is 29.6 kg/m. GEN: Well nourished, well developed, female in no acute distress HEENT: normal for age  Neck: JVD 10 cm, +HJR, no carotid bruit, no masses Cardiac: RRR; soft murmur, no rubs, or gallops Respiratory: decreased BS bases bilaterally, normal work of breathing GI: soft, nontender, nondistended, + BS MS: no deformity or atrophy; 2-3+ LE edema; distal  pulses are 2+ in upper extremities, cannot palpate in lower extremities due to edema Skin: warm and dry, no rash Neuro:  Strength and sensation are intact Psych: euthymic mood, full affect   EKG:  EKG is not ordered today. SR by exam body great radiograph ointment for 6 days from today 5 days when asked if presents part of one kind.  She says her daughter is helping with the medication and she reviewed the symptoms but I still do not feel comfortable especially with her creatinine being as high as it is issues that we have been times in the morning I just do not feel comfortable that we can pull fluid off having any other issues and a creatinine of 4.53 she gets 20% 2020 she forgets Clinic she forgets to give her the papers and not know of anybody else in her family not that the daughter will help with her right ask her to in case so daughter has a lot of damage and part of the problem is trying to just take care of 2 people that really should be in the facility just but I already her 0.74 cells given   ECHO: 01/24/2019  1. The left ventricle has severely reduced systolic function, with an ejection fraction of 25-30%. The cavity size was normal. There is mildly increased left ventricular wall thickness. Left ventricular diastolic Doppler parameters are consistent with  pseudonormalization. Left ventricular diffuse hypokinesis.  2. The right ventricle has mildly reduced systolic function. The cavity was mildly enlarged. Right ventricular systolic pressure is moderately elevated with an estimated pressure of 43.3 mmHg.  3. Left atrial size was moderately dilated.  4. Right atrial size was mildly dilated.  5. The mitral valve is grossly normal. Mild thickening of the mitral valve leaflet. There is mild mitral annular calcification present.  6. The tricuspid valve is grossly normal. Tricuspid valve regurgitation is moderate.  7. The aortic valve is tricuspid. Mild thickening of the aortic valve. No  stenosis of the aortic valve.  8. Global hypokinesis with akinesis of  the mid septum; overall severe LV dysfunction; mild LVH; moderate diastolic dysfunction; mild MR; biatrial enlargement; mild RVE with mild RV dysfunction; moderate TR; moderate pulmonary hypertension.  CATH: 09/07/2017  Prox RCA lesion is 75% stenosed.  A drug-eluting stent was successfully placed using a STENT PROMUS PREM MR 2.75X16.  Post intervention, there is a 5% residual stenosis.  Ost RCA lesion is 85% stenosed.  A drug-eluting stent was successfully placed using a STENT PROMUS PREM MR 3.0X12. Overlaps Stent 1 proximally.  Post intervention, there is a 0% residual stenosis.  Mid RCA-1 lesion is 30% stenosed. Mid RCA-2 lesion is 25% stenosed.  Ost 1st Diag lesion is 30% stenosed. Ost 2nd Diag to 2nd Diag lesion is 80% stenosed. Stable.  Prox LAD lesion is 40% stenosed. Previously placed Mid LAD Promus drug eluting stent is widely patent.  There is mild left ventricular systolic dysfunction. The left ventricular ejection fraction is 45-50% by visual estimate.  LV end diastolic pressure is mildly elevated.   Severe Ostial & proximal RCA stenosis (progression of disease from Oct 2017).  Successful PCI using 2 overlapping DES.  Difficult, ostial lesion PCI requiring multiple balloons, buddy wire & extra support wire. Intervention     Recent Labs: 01/23/2019: B Natriuretic Peptide [id number] 01/27/2019: Magnesium 1.9 06/16/2019: TSH 1.650 06/18/2019: ALT 178; BUN 60; Creatinine, Ser 4.53; Hemoglobin 9.5; Platelets 250; Potassium 3.7; Sodium 139  CBC    Component Value Date/Time   WBC 8.1 06/18/2019 0317   RBC 3.80 (L) 06/18/2019 0317   HGB 9.5 (L) 06/18/2019 0317   HGB 8.4 (L) 01/24/2018 1048   HCT 30.3 (L) 06/18/2019 0317   HCT 27.2 (L) 01/24/2018 1048   PLT 250 06/18/2019 0317   PLT 356 01/24/2018 1048   MCV 79.7 (L) 06/18/2019 0317   MCV 78 (L) 01/24/2018 1048   MCH 25.0 (L) 06/18/2019 0317    MCHC 31.4 06/18/2019 0317   RDW 16.9 (H) 06/18/2019 0317   RDW 17.6 (H) 01/24/2018 1048   LYMPHSABS 1.4 07/22/2018 0630   LYMPHSABS 1.6 01/24/2018 1048   MONOABS 0.1 07/22/2018 0630   EOSABS 0.0 07/22/2018 0630   EOSABS 0.1 01/24/2018 1048   BASOSABS 0.0 07/22/2018 0630   BASOSABS 0.0 01/24/2018 1048   CMP Latest Ref Rng & Units 06/18/2019 06/17/2019 06/16/2019  Glucose 70 - 99 mg/dL 158(H) 152(H) -  BUN 8 - 23 mg/dL 60(H) 57(H) -  Creatinine 0.44 - 1.00 mg/dL 4.53(H) 4.61(H) -  Sodium 135 - 145 mmol/L 139 139 -  Potassium 3.5 - 5.1 mmol/L 3.7 3.4(L) -  Chloride 98 - 111 mmol/L 111 109 -  CO2 22 - 32 mmol/L 15(L) 14(L) -  Calcium 8.9 - 10.3 mg/dL 8.2(L) 8.5(L) -  Total Protein 6.5 - 8.1 g/dL 5.3(L) - 5.9(L)  Total Bilirubin 0.3 - 1.2 mg/dL 1.1 - 1.4(H)  Alkaline Phos 38 - 126 U/L 90 - 109  AST 15 - 41 U/L 100(H) - 250(H)  ALT 0 - 44 U/L 178(H) - 224(H)     Lipid Panel Lab Results  Component Value Date   CHOL 217 (H) 01/28/2019   HDL 75 01/28/2019   LDLCALC 130 (H) 01/28/2019   TRIG 59 01/28/2019   CHOLHDL 2.9 01/28/2019      Wt Readings from Last 3 Encounters:  06/29/19 183 lb 6.4 oz (83.2 kg)  06/16/19 176 lb 9.4 oz (80.1 kg)  05/16/19 176 lb 9.6 oz (80.1 kg)     Other studies Reviewed: Additional studies/ records that  were reviewed today include: Office notes, hospital records and testing.  ASSESSMENT AND PLAN:  1.  Acute on chronic systolic CHF:  - Wt is up, sx are worse and it is not clear that she is compliant w/ meds. -Additionally, her creatinine was very high, 4.53 the last time it was checked, at discharge on 11/8. -Dr. Oval Linsey was in to see the patient and agrees that outpatient management is not appropriate. -There are no current beds, she will be sent to the emergency room to await admission. -Start with Lasix 80 mg IV twice daily plus metolazone 2.5 mg daily starting in a.m. -Titrate as needed for diuresis.  May need nephrology help  2. CKD IV -  when she was admitted for DKA 11/05-11/08, d/c Cr was 4.53 - her Cr has been > 4 since 04/2019, before that it was in the mid-3s 01/2019, mid2s in 2019 - seen by Nephrology 01/2019, CKD felt 2nd DM, +proteinuria - suspect she needs to be seen by Nephrology again  3.  Diabetes: -We will start sliding scale insulin in the hospital, if additional management is needed, may have to: Internal medicine consult.  4.  Hypertension: -Continue carvedilol 25 mg twice daily and hydralazine 100 mg 3 times daily. -Because her blood pressure is so very high, continue amlodipine 10 mg daily for now.  However, that is not an optimal blood pressure medication for patient with an EF of 25%. -Discuss different blood pressure options with MD.  5. Chronic anticoagulation - her INR was 1.1 today. -She states she is compliant with her medications and her daughter is helping with this. -She does also state that she had 2 servings of collard's this past week. -This will need to be monitored very carefully after discharge -For now, start heparin and Coumadin per pharmacy  6.  PAF: -Her heart rate is regular now, thanks sinus rhythm. -Follow on telemetry while admitted   Current medicines are reviewed at length with the patient today.  The patient does not have concerns regarding medicines.  The following changes have been made:  F/u after d/c.  Labs/ tests ordered today include:  No orders of the defined types were placed in this encounter.  Disposition:   FU with Skeet Latch, MD  Signed, Rosaria Ferries, PA-C  06/29/2019 3:44 PM    [facility] Phone: [phone]; Fax: [phone]

## 2019-06-30 DIAGNOSIS — I5043 Acute on chronic combined systolic (congestive) and diastolic (congestive) heart failure: Secondary | ICD-10-CM

## 2019-06-30 DIAGNOSIS — Z794 Long term (current) use of insulin: Secondary | ICD-10-CM

## 2019-06-30 DIAGNOSIS — E1122 Type 2 diabetes mellitus with diabetic chronic kidney disease: Secondary | ICD-10-CM

## 2019-06-30 DIAGNOSIS — N184 Chronic kidney disease, stage 4 (severe): Secondary | ICD-10-CM

## 2019-06-30 LAB — GLUCOSE, CAPILLARY
Glucose-Capillary: 59 mg/dL — ABNORMAL LOW (ref 70–99)
Glucose-Capillary: 118 mg/dL — ABNORMAL HIGH (ref 70–99)
Glucose-Capillary: 153 mg/dL — ABNORMAL HIGH (ref 70–99)
Glucose-Capillary: 156 mg/dL — ABNORMAL HIGH (ref 70–99)

## 2019-06-30 LAB — BASIC METABOLIC PANEL
Anion gap: 11 (ref 5–15)
BUN: 59 mg/dL — ABNORMAL HIGH (ref 8–23)
CO2: 23 mmol/L (ref 22–32)
Calcium: 8.5 mg/dL — ABNORMAL LOW (ref 8.9–10.3)
Chloride: 101 mmol/L (ref 98–111)
Creatinine, Ser: 4.18 mg/dL — ABNORMAL HIGH (ref 0.44–1.00)
GFR calc Af Amer: 12 mL/min — ABNORMAL LOW (ref >60)
GFR calc non Af Amer: 11 mL/min — ABNORMAL LOW (ref >60)
Glucose, Bld: 137 mg/dL — ABNORMAL HIGH (ref 70–99)
Potassium: 3.4 mmol/L — ABNORMAL LOW (ref 3.5–5.1)
Sodium: 135 mmol/L (ref 135–145)

## 2019-06-30 LAB — VITAMIN B12: Vitamin B-12: 871 pg/mL (ref 180–914)

## 2019-06-30 LAB — CBC
HCT: 28.1 % — ABNORMAL LOW (ref 36.0–46.0)
Hemoglobin: 8.8 g/dL — ABNORMAL LOW (ref 12.0–15.0)
MCH: 23.8 pg — ABNORMAL LOW (ref 26.0–34.0)
MCHC: 31.3 g/dL (ref 30.0–36.0)
MCV: 76.2 fL — ABNORMAL LOW (ref 80.0–100.0)
Platelets: 285 10*3/uL (ref 150–400)
RBC: 3.69 MIL/uL — ABNORMAL LOW (ref 3.87–5.11)
RDW: 16.3 % — ABNORMAL HIGH (ref 11.5–15.5)
WBC: 4.9 10*3/uL (ref 4.0–10.5)
nRBC: 0 % (ref 0.0–0.2)

## 2019-06-30 LAB — SARS CORONAVIRUS 2 (TAT 6-24 HRS): SARS Coronavirus 2: NEGATIVE

## 2019-06-30 LAB — IRON AND TIBC
Iron: 53 ug/dL (ref 28–170)
Saturation Ratios: 21 % (ref 10.4–31.8)
TIBC: 252 ug/dL (ref 250–450)
UIBC: 199 ug/dL

## 2019-06-30 LAB — FERRITIN: Ferritin: 36 ng/mL (ref 11–307)

## 2019-06-30 LAB — RETICULOCYTES
Immature Retic Fract: 2.5 % (ref 2.3–15.9)
RBC.: 3.52 MIL/uL — ABNORMAL LOW (ref 3.87–5.11)
Retic Count, Absolute: 28.5 10*3/uL (ref 19.0–186.0)
Retic Ct Pct: 0.8 % (ref 0.4–3.1)

## 2019-06-30 LAB — PROTIME-INR
INR: 1.2 (ref 0.8–1.2)
Prothrombin Time: 14.6 seconds (ref 11.4–15.2)

## 2019-06-30 LAB — FOLATE: Folate: 9.5 ng/mL (ref >5.9)

## 2019-06-30 LAB — HEPARIN LEVEL (UNFRACTIONATED)
Heparin Unfractionated: 0.82 IU/mL — ABNORMAL HIGH (ref 0.30–0.70)
Heparin Unfractionated: 0.64 IU/mL (ref 0.30–0.70)

## 2019-06-30 MED ORDER — WARFARIN SODIUM 10 MG PO TABS
10.0000 mg | ORAL_TABLET | Freq: Once | ORAL | Status: AC
  Administered 2019-06-30: 10 mg via ORAL
  Filled 2019-06-30: qty 1

## 2019-06-30 MED ORDER — CLOPIDOGREL BISULFATE 75 MG PO TABS
75.0000 mg | ORAL_TABLET | Freq: Every day | ORAL | Status: DC
  Administered 2019-07-01 – 2019-07-04 (×4): 75 mg via ORAL
  Filled 2019-06-30 (×4): qty 1

## 2019-06-30 MED ORDER — POTASSIUM CHLORIDE CRYS ER 20 MEQ PO TBCR: 40.0000 meq | EXTENDED_RELEASE_TABLET | Freq: Once | ORAL | Status: DC

## 2019-06-30 MED ORDER — INSULIN ASPART 100 UNIT/ML ~~LOC~~ SOLN
0.0000 [IU] | SUBCUTANEOUS | Status: DC
  Administered 2019-06-30 – 2019-07-01 (×4): 2 [IU] via SUBCUTANEOUS
  Administered 2019-07-01 – 2019-07-02 (×3): 3 [IU] via SUBCUTANEOUS
  Administered 2019-07-02: 2 [IU] via SUBCUTANEOUS
  Administered 2019-07-03: 1 [IU] via SUBCUTANEOUS
  Administered 2019-07-03: 2 [IU] via SUBCUTANEOUS
  Administered 2019-07-03: 3 [IU] via SUBCUTANEOUS
  Administered 2019-07-03 – 2019-07-04 (×5): 2 [IU] via SUBCUTANEOUS
  Administered 2019-07-05: 1 [IU] via SUBCUTANEOUS
  Administered 2019-07-05 (×2): 2 [IU] via SUBCUTANEOUS
  Administered 2019-07-05: 1 [IU] via SUBCUTANEOUS
  Administered 2019-07-05 – 2019-07-07 (×5): 2 [IU] via SUBCUTANEOUS
  Administered 2019-07-07: 1 [IU] via SUBCUTANEOUS
  Administered 2019-07-07: 5 [IU] via SUBCUTANEOUS
  Administered 2019-07-08 (×2): 1 [IU] via SUBCUTANEOUS
  Administered 2019-07-08 (×2): 3 [IU] via SUBCUTANEOUS
  Administered 2019-07-08 – 2019-07-09 (×2): 2 [IU] via SUBCUTANEOUS
  Administered 2019-07-09: 1 [IU] via SUBCUTANEOUS
  Administered 2019-07-09: 2 [IU] via SUBCUTANEOUS
  Administered 2019-07-10: 3 [IU] via SUBCUTANEOUS
  Administered 2019-07-10: 2 [IU] via SUBCUTANEOUS
  Administered 2019-07-11: 3 [IU] via SUBCUTANEOUS
  Administered 2019-07-11: 5 [IU] via SUBCUTANEOUS
  Administered 2019-07-11: 01:00:00 3 [IU] via SUBCUTANEOUS

## 2019-06-30 MED ORDER — POTASSIUM CHLORIDE CRYS ER 20 MEQ PO TBCR
20.0000 meq | EXTENDED_RELEASE_TABLET | Freq: Once | ORAL | Status: AC
  Administered 2019-06-30: 20 meq via ORAL
  Filled 2019-06-30: qty 1

## 2019-06-30 NOTE — Progress Notes (Addendum)
Inpatient Diabetes Program Recommendations  AACE/ADA: New Consensus Statement on Inpatient Glycemic Control (2015)  Target Ranges:  Prepandial:   less than 140 mg/dL      Peak postprandial:   less than 180 mg/dL (1-2 hours)      Critically ill patients:  140 - 180 mg/dL   Results for RUKSANA, OSINSKI (MRN [id number]) as of 06/30/2019 08:54  Ref. Range 06/29/2019 21:27 06/30/2019 08:12 06/30/2019 08:35  Glucose-Capillary Latest Ref Range: 70 - 99 mg/dL 165 (H)   4 units LANTUS 59 (L) 118 (H)   4 units LANTUS    Admit with: SOB/ CHF Exacerbation  History: DM, CVA, CKD, CHF  Home DM Meds: Glipizide 10 mg BID       Lantus 4 units Daily  Current Orders: Lantus 4 units Daily      Novolog Sensitive Correction Scale/ SSI (0-9 units) TID AC + HS      Glipizide 10 mg BID      Patient received 4 units Lantus last PM--Hypoglycemic this AM (CBG down to 59 mg/dl).  Got another dose of Lantus this AM.  Was discharged on 11/08 on Glipizide and Lantus (Amaryl and Metformin stopped).     MD- Please consider the following in-hospital insulin adjustments:  1. Stop Lantus for now  2. Stop Glipizide for now  3. Increase frequency of Novolog SSi to Q4 hours  4. Glipizide may not be the best choice for this patient at home given her elevated creatinine--May consider stopping Glipizide at home and possibly starting Tradjenta 5 mg Daily for home along with the Lantus.  Tradjenta does not have to be renally dosed and is cleared by the gut--Has lower risk for Hypoglycemia at home.      --Will follow patient during hospitalization--  Wyn Quaker RN, MSN, CDE Diabetes Coordinator Inpatient Glycemic Control Team Team Pager: [phone] (8a-5p)

## 2019-06-30 NOTE — Progress Notes (Signed)
Notified pharmacy three times about eye drops for the left eye, alphagan and cosopt. Unable to locate it during my shift in both medication rooms. Night nurse aware.

## 2019-06-30 NOTE — Progress Notes (Addendum)
Progress Note  Patient Name: Amber Young Date of Encounter: 06/30/2019  Primary Cardiologist: Skeet Latch, MD   Subjective   No chest pain and feels better with breathing.  Has pain at Lt great toe at nail, no redness and no drainage , no pain at joint.    Inpatient Medications    Scheduled Meds: . amLODipine  10 mg Oral Daily  . atorvastatin  40 mg Oral q1800  . brimonidine  1 drop Left Eye BID  . carvedilol  25 mg Oral BID  . dorzolamide-timolol  1 drop Left Eye BID  . ferrous sulfate  325 mg Oral QODAY  . furosemide  80 mg Intravenous BID  . glipiZIDE  10 mg Oral BID AC  . hydrALAZINE  100 mg Oral TID  . insulin aspart  0-5 Units Subcutaneous QHS  . insulin aspart  0-9 Units Subcutaneous TID WC  . insulin glargine  4 Units Subcutaneous Daily  . isosorbide dinitrate  20 mg Oral TID  . ketorolac  1 drop Right Eye QID  . ofloxacin  1 drop Right Eye TID  . potassium chloride  20 mEq Oral Once  . ticagrelor  90 mg Oral BID  . Warfarin - Pharmacist Dosing Inpatient   Does not apply q1800   Continuous Infusions: . heparin 1,000 Units/hr (06/30/19 0341)   PRN Meds: acetaminophen, ALPRAZolam, nitroGLYCERIN, ondansetron (ZOFRAN) IV, zolpidem   Vital Signs    Vitals:   06/29/19 2257 06/30/19 0423 06/30/19 0427 06/30/19 0811  BP: (!) 193/81 (!) 189/87  (!) 193/80  Pulse: 62 74  73  Resp: 18 20    Temp: 98.1 F (36.7 C) 98 F (36.7 C)  98 F (36.7 C)  TempSrc: Oral Oral  Oral  SpO2: 100%   98%  Weight:   80.2 kg   Height:        Intake/Output Summary (Last 24 hours) at 06/30/2019 0828 Last data filed at 06/30/2019 0706 Gross per 24 hour  Intake 125.92 ml  Output 1600 ml  Net -1474.08 ml   Last 3 Weights 06/30/2019 06/29/2019 06/29/2019  Weight (lbs) 176 lb 11.2 oz 178 lb 6.4 oz 183 lb 6.4 oz  Weight (kg) 80.151 kg 80.922 kg 83.19 kg      Telemetry    SR - Personally Reviewed  ECG    No new  - Personally Reviewed  Physical Exam   GEN:  No acute distress.   Neck: mild JVD flat in bed Cardiac: RRR, no murmurs, rubs, or gallops.  Respiratory: Clear to auscultation bilaterally. GI: Soft, nontender, non-distended  MS: 1+lower ext edema; No deformity.+ tip of Lt great toe pain no redness or swelling  Neuro:  Nonfocal  Psych: Normal affect   Labs    High Sensitivity Troponin:   Recent Labs  Lab 06/15/19 0009 06/15/19 1748 06/16/19 0323 06/16/19 0550 06/16/19 0926  TROPONINIHS 76* 70* 86* 91* 96*      Chemistry Recent Labs  Lab 06/29/19 1648 06/30/19 0234  NA 135 135  K 4.2 3.4*  CL 103 101  CO2 23 23  GLUCOSE 197* 137*  BUN 59* 59*  CREATININE 4.32* 4.18*  CALCIUM 8.6* 8.5*  PROT 6.3*  --   ALBUMIN 2.7*  --   AST 20  --   ALT 31  --   ALKPHOS 104  --   BILITOT 0.6  --   GFRNONAA 10* 11*  GFRAA 12* 12*  ANIONGAP 9 11     Hematology  Recent Labs  Lab 06/29/19 1648 06/30/19 0234  WBC 4.9 4.9  RBC 4.23 3.69*  HGB 10.2* 8.8*  HCT 32.8* 28.1*  MCV 77.5* 76.2*  MCH 24.1* 23.8*  MCHC 31.1 31.3  RDW 16.7* 16.3*  PLT 334 285    BNP Recent Labs  Lab 06/29/19 2211  BNP 2,340.4*     DDimer No results for input(s): DDIMER in the last 168 hours.   Radiology    Dg Chest 2 View  Result Date: 06/29/2019 CLINICAL DATA:  Shortness of breath EXAM: CHEST - 2 VIEW COMPARISON:  June 15, 2019 FINDINGS: No new consolidation or edema. No pleural effusion or pneumothorax. Stable mild cardiomegaly. Coronary artery stent is noted. No acute osseous abnormality. IMPRESSION: No acute process in the chest.  Stable mild cardiomegaly. Electronically Signed   By: Macy Mis M.D.   On: 06/29/2019 17:15    Cardiac Studies   ECHO: 01/24/2019 1. The left ventricle has severely reduced systolic function, with an ejection fraction of 25-30%. The cavity size was normal. There is mildly increased left ventricular wall thickness. Left ventricular diastolic Doppler parameters are consistent with   pseudonormalization. Left ventricular diffuse hypokinesis. 2. The right ventricle has mildly reduced systolic function. The cavity was mildly enlarged. Right ventricular systolic pressure is moderately elevated with an estimated pressure of 43.3 mmHg. 3. Left atrial size was moderately dilated. 4. Right atrial size was mildly dilated. 5. The mitral valve is grossly normal. Mild thickening of the mitral valve leaflet. There is mild mitral annular calcification present. 6. The tricuspid valve is grossly normal. Tricuspid valve regurgitation is moderate. 7. The aortic valve is tricuspid. Mild thickening of the aortic valve. No stenosis of the aortic valve. 8. Global hypokinesis with akinesis of the mid septum; overall severe LV dysfunction; mild LVH; moderate diastolic dysfunction; mild MR; biatrial enlargement; mild RVE with mild RV dysfunction; moderate TR; moderate pulmonary hypertension.   Patient Profile     63 y.o. female with a history of HTN, DM, HLD, CVA, PAF on coumadin, GERD,anemia, DES x2 RCA 1/29/2019w/ med rx for 40% LAD, 2017 LAD stent ok, EF 40-45% by echo 08/2017 w/ grade 3 dd, S-D-CHF, CKD IV (Dr Posey Pronto), hx med noncompliance at times now admitted with acute CHF exacerbation  Assessment & Plan    Acute on chronic systolic HF/ CM with EF 123XX123 -- BNP 2340  --on lasix 80 IV BID has rec'd one dose --neg 1234 since admit only todays wt is present but from outpt wt she is down 7 lbs.  --cr 4.18 down from 4.32 (cr in June 2020 was 3.52)  --TSH 1.162 --on isordil hydralazine coreg 25 BID  CKD 4  --cr in June 3.52 more recently in the 4.53 range.  --possible renal consult they saw in 01/2019 (progressive diabetic nephropathy)   CAD with hx of Stenting - overlapping DES 08/2017 and hx of stent to mLAD was patent on cath 08/2017 otherwise non obstructive disease.   --on Brilinta --troponins hs mildly elevated in 70-80s without chest pain most likely from acute HF alone.    Anemia Hgb on admit 10.2 and now 8.8 plts 285 --in 06/18/19 hgb was 9.5 (iron 01/2019 was 19 and iron sat 7 with hgb 11.2)   Will check anemia panel.  Pt is on iron   On IV heparin, brilinta and coumadin with INR of 1.2   DM-2 on insulin with hgb A1c of 8.9  --glucose 165 to 118  --on glucotrol and SSI  --  lantus at 4 units will hold- had hypoglycemia this AM  and increase SSI to every 4 hours.  And hold glipizide - may benefit from tradjenta instead of glipizide at home with CKD-4  --diabetes RN has consulted   HTN see above meds and amlodipine, BP elevated since admit now 193/80 - again unsure if taking med correctly at home may need to increase  HLD on lipitor 40   PAF on coumadin with INR 1.2  Was on 1.1 on admit.  With anemia and in SR may be prudent to let drift up with coumadin though currently on IV heparin.          For questions or updates, please contact [place] Please consult [email or web address] for contact info under        Signed, Cecilie Kicks, NP  06/30/2019, 8:28 AM    I have examined the patient and reviewed assessment and plan and discussed with patient.  Agree with above as stated.  BP elevated.  Continue diuresis for now.  Watch renal function. SSI for DM management.  DAily BMet.  May need renal input if Cr worsens.  Larae Grooms

## 2019-06-30 NOTE — Discharge Instructions (Addendum)
Information on my medicine - Coumadin®   (Warfarin) ° °This medication education was reviewed with me or my healthcare representative as part of my discharge preparation.   ° °Why was Coumadin prescribed for you? °Coumadin was prescribed for you because you have a blood clot or a medical condition that can cause an increased risk of forming blood clots. Blood clots can cause serious health problems by blocking the flow of blood to the heart, lung, or brain. Coumadin can prevent harmful blood clots from forming. °As a reminder your indication for Coumadin is:   Stroke Prevention Because Of Atrial Fibrillation ° °What test will check on my response to Coumadin? °While on Coumadin (warfarin) you will need to have an INR test regularly to ensure that your dose is keeping you in the desired range. The INR (international normalized ratio) number is calculated from the result of the laboratory test called prothrombin time (PT). ° °If an INR APPOINTMENT HAS NOT ALREADY BEEN MADE FOR YOU please schedule an appointment to have this lab work done by your health care provider within 7 days. °Your INR goal is usually a number between:  2 to 3 or your provider may give you a more narrow range like 2-2.5.  Ask your health care provider during an office visit what your goal INR is. ° °What  do you need to  know  About  COUMADIN? °Take Coumadin (warfarin) exactly as prescribed by your healthcare provider about the same time each day.  DO NOT stop taking without talking to the doctor who prescribed the medication.  Stopping without other blood clot prevention medication to take the place of Coumadin may increase your risk of developing a new clot or stroke.  Get refills before you run out. ° °What do you do if you miss a dose? °If you miss a dose, take it as soon as you remember on the same day then continue your regularly scheduled regimen the next day.  Do not take two doses of Coumadin at the same time. ° °Important Safety  Information °A possible side effect of Coumadin (Warfarin) is an increased risk of bleeding. You should call your healthcare provider right away if you experience any of the following: °? Bleeding from an injury or your nose that does not stop. °? Unusual colored urine (red or dark brown) or unusual colored stools (red or black). °? Unusual bruising for unknown reasons. °? A serious fall or if you hit your head (even if there is no bleeding). ° °Some foods or medicines interact with Coumadin® (warfarin) and might alter your response to warfarin. To help avoid this: °? Eat a balanced diet, maintaining a consistent amount of Vitamin K. °? Notify your provider about major diet changes you plan to make. °? Avoid alcohol or limit your intake to 1 drink for women and 2 drinks for men per day. °(1 drink is 5 oz. wine, 12 oz. beer, or 1.5 oz. liquor.) ° °Make sure that ANY health care provider who prescribes medication for you knows that you are taking Coumadin (warfarin).  Also make sure the healthcare provider who is monitoring your Coumadin knows when you have started a new medication including herbals and non-prescription products. ° °Coumadin® (Warfarin)  Major Drug Interactions  °Increased Warfarin Effect Decreased Warfarin Effect  °Alcohol (large quantities) °Antibiotics (esp. Septra/Bactrim, Flagyl, Cipro) °Amiodarone (Cordarone) °Aspirin (ASA) °Cimetidine (Tagamet) °Megestrol (Megace) °NSAIDs (ibuprofen, naproxen, etc.) °Piroxicam (Feldene) °Propafenone (Rythmol SR) °Propranolol (Inderal) °Isoniazid (INH) °Posaconazole (Noxafil) Barbiturates (Phenobarbital) °  Carbamazepine (Tegretol) Chlordiazepoxide (Librium) Cholestyramine (Questran) Griseofulvin Oral Contraceptives Rifampin Sucralfate (Carafate) Vitamin K   Coumadin (Warfarin) Major Herbal Interactions  Increased Warfarin Effect Decreased Warfarin Effect  Garlic Ginseng Ginkgo biloba Coenzyme Q10 Green tea [place]. Johns wort    Coumadin (Warfarin)  FOOD Interactions  Eat a consistent number of servings per week of foods HIGH in Vitamin K (1 serving =  cup)  Collards (cooked, or boiled & drained) Kale (cooked, or boiled & drained) Mustard greens (cooked, or boiled & drained) Parsley *serving size only =  cup Spinach (cooked, or boiled & drained) Swiss chard (cooked, or boiled & drained) Turnip greens (cooked, or boiled & drained)  Eat a consistent number of servings per week of foods MEDIUM-HIGH in Vitamin K (1 serving = 1 cup)  Asparagus (cooked, or boiled & drained) Broccoli (cooked, boiled & drained, or raw & chopped) Brussel sprouts (cooked, or boiled & drained) *serving size only =  cup Lettuce, raw (green leaf, endive, romaine) Spinach, raw Turnip greens, raw & chopped   These websites have more information on Coumadin (warfarin):  FailFactory.se; VeganReport.com.au;     [facility]  Discharge Instructions  AV Fistula or Graft Surgery for Dialysis Access  Please refer to the following instructions for your post-procedure care. Your surgeon or physician assistant will discuss any changes with you.  Activity  You may drive the day following your surgery, if you are comfortable and no longer taking prescription pain medication. Resume full activity as the soreness in your incision resolves.  Bathing/Showering  You may shower after you go home. Keep your incision dry for 48 hours. Do not soak in a bathtub, hot tub, or swim until the incision heals completely. You may not shower if you have a hemodialysis catheter.  Incision Care  Clean your incision with mild soap and water after 48 hours. Pat the area dry with a clean towel. You do not need a bandage unless otherwise instructed. Do not apply any ointments or creams to your incision. You may have skin glue on your incision. Do not peel it off. It will come off on its own in about one week. Your arm may swell a bit  after surgery. To reduce swelling use pillows to elevate your arm so it is above your heart. Your doctor will tell you if you need to lightly wrap your arm with an ACE bandage.  Diet  Resume your normal diet. There are not special food restrictions following this procedure. In order to heal from your surgery, it is CRITICAL to get adequate nutrition. Your body requires vitamins, minerals, and protein. Vegetables are the best source of vitamins and minerals. Vegetables also provide the perfect balance of protein. Processed food has little nutritional value, so try to avoid this.  Medications  Resume taking all of your medications. If your incision is causing pain, you may take over-the counter pain relievers such as acetaminophen (Tylenol). If you were prescribed a stronger pain medication, please be aware these medications can cause nausea and constipation. Prevent nausea by taking the medication with a snack or meal. Avoid constipation by drinking plenty of fluids and eating foods with high amount of fiber, such as fruits, vegetables, and grains.  Do not take Tylenol if you are taking prescription pain medications.  Follow up Your surgeon may want to see you in the office following your access surgery. If so, this will be arranged at the time of your surgery.  Please call  us immediately for any of the following conditions:  Increased pain, redness, drainage (pus) from your incision site Fever of 101 degrees or higher Severe or worsening pain at your incision site Hand pain or numbness.  Reduce your risk of vascular disease:  Stop smoking. If you would like help, call [facility] at 1-800-QUIT-NOW 802-460-6804) or [place] at [place] your cholesterol Maintain a desired weight Control your diabetes Keep your blood pressure down  Dialysis  It will take several weeks to several months for your new dialysis access to be ready for use. Your surgeon will determine when it  is okay to use it. Your nephrologist will continue to direct your dialysis. You can continue to use your Permcath until your new access is ready for use.   07/11/2019 Amber Young [id number] 15-Jul-1956  Surgeon(s): Angelia Mould, MD  Procedure(s): INSERTION OF TUNNELED DIALYSIS CATHETER ARTERIOVENOUS (AV) FISTULA CREATION  x Do not stick fistula for 12 weeks.   If you have any questions, please call the office at [phone].

## 2019-06-30 NOTE — Progress Notes (Signed)
[facility] for Heparin  Indication: History of atrial fibrillation on warfarin PTA with subtherapeutic INR on admission   Allergies  Allergen Reactions  . Ace Inhibitors Cough    Patient Measurements: Height: 5\' 7"  (170.2 cm) Weight: 178 lb 6.4 oz (80.9 kg) IBW/kg (Calculated) : 61.6 Heparin Dosing Weight: 76.8 kg   Vital Signs: Temp: 98.1 F (36.7 C) (11/19 2257) Temp Source: Oral (11/19 2257) BP: 193/81 (11/19 2257) Pulse Rate: 62 (11/19 2257)  Labs: Recent Labs    06/29/19 1542 06/29/19 1648 06/29/19 1824 06/30/19 0234  HGB  --  10.2*  --  8.8*  HCT  --  32.8*  --  28.1*  PLT  --  334  --  285  APTT  --   --  29  --   LABPROT  --   --   --  14.6  INR 1.1*  --   --  1.2  HEPARINUNFRC  --   --   --  0.82*  CREATININE  --  4.32*  --  4.18*    Estimated Creatinine Clearance: 15.1 mL/min (A) (by C-G formula based on SCr of 4.18 mg/dL (H)).   Medical History: Past Medical History:  Diagnosis Date  . Anemia   . Atrial fibrillation ([place]) 06/17/2019  . Chronic combined systolic and diastolic CHF (congestive heart failure) ([place])    a. EF 40-45% in 2019 b. EF 25-30% by repeat echo in 01/2019  . CKD (chronic kidney disease), stage IV ([place])   . Coronary artery disease 08/2017   DES x 2 RCA  . DKA (diabetic ketoacidoses) ([place]) 06/16/2019  . GERD (gastroesophageal reflux disease)   . Heart murmur   . Hyperlipidemia   . Hypertension   . Proliferative diabetic retinopathy ([place])    left eye with vitreous hemorrhage and tractional retinal detachment  . Stroke ([place]) 09/2014   numbness left upper lip, finger tips on left hand; "resolved" (05/18/2016)  . Type II diabetes mellitus ([place])   . Wears glasses     Medications:  Medications Prior to Admission  Medication Sig Dispense Refill Last Dose  . acetaminophen (TYLENOL) 500 MG tablet Take 1,000 mg by mouth every 6 (six) hours as needed for mild pain or headache.   Past Month at  Unknown time  . amLODipine (NORVASC) 10 MG tablet Take 1 tablet (10 mg total) by mouth daily. 90 tablet 1 06/29/2019 at Unknown time  . atorvastatin (LIPITOR) 40 MG tablet Take 1 tablet (40 mg total) by mouth daily at 6 PM. 90 tablet 1 06/28/2019 at Unknown time  . brimonidine (ALPHAGAN) 0.2 % ophthalmic solution Place 1 drop into the left eye 2 (two) times daily.   06/29/2019 at Unknown time  . carvedilol (COREG) 25 MG tablet Take 1 tablet (25 mg total) by mouth 2 (two) times daily. 180 tablet 1 06/29/2019 at 0800  . dorzolamide-timolol (COSOPT) 22.3-6.8 MG/ML ophthalmic solution Place 1 drop into the left eye 2 (two) times daily.   06/29/2019 at Unknown time  . ferrous sulfate (FERROUSUL) 325 (65 FE) MG tablet Take 1 tablet (325 mg total) by mouth every other day. 90 tablet 1 06/29/2019 at Unknown time  . furosemide (LASIX) 80 MG tablet Take 80 mg by mouth daily.    06/29/2019 at Unknown time  . glipiZIDE (GLUCOTROL) 10 MG tablet Take 1 tablet (10 mg total) by mouth 2 (two) times daily before a meal. 180 tablet 1 06/29/2019 at Unknown time  .  hydrALAZINE (APRESOLINE) 100 MG tablet Take 1 tablet (100 mg total) by mouth 3 (three) times daily. 270 tablet 1 06/29/2019 at Unknown time  . Insulin Glargine (LANTUS) 100 UNIT/ML Solostar Pen Inject 4 Units into the skin daily. 9 mL 1 06/29/2019 at Unknown time  . Insulin Pen Needle (TRUEPLUS PEN NEEDLES) 31G X 6 MM MISC USE AS DIRECTED 300 each 1 unknown at unknown  . isosorbide dinitrate (ISORDIL) 20 MG tablet Take 1 tablet (20 mg total) by mouth 3 (three) times daily. 270 tablet 1 Past Week at Unknown time  . ketorolac (ACULAR) 0.4 % SOLN Place 1 drop into the right eye 4 (four) times daily.   06/29/2019 at Unknown time  . nitroGLYCERIN (NITROSTAT) 0.4 MG SL tablet Place 1 tablet (0.4 mg total) under the tongue every 5 (five) minutes x 3 doses as needed for chest pain. 90 tablet 1 unknown at unknown  . ofloxacin (OCUFLOX) 0.3 % ophthalmic solution Place 1  drop into the right eye 3 (three) times daily.   06/29/2019 at Unknown time  . ticagrelor (BRILINTA) 90 MG TABS tablet Take 1 tablet (90 mg total) by mouth 2 (two) times daily. 180 tablet 1 06/29/2019 at 0800  . warfarin (COUMADIN) 5 MG tablet Take 5mg  daily except for 7.5mg  every Monday, Wednesday and Friday (Patient taking differently: Take 5-7.5 mg by mouth See admin instructions. Take 5mg  daily except for 7.5mg  every Monday, Wednesday and Friday) 90 tablet 1 06/28/2019 at 1800   Scheduled:  . amLODipine  10 mg Oral Daily  . atorvastatin  40 mg Oral q1800  . brimonidine  1 drop Left Eye BID  . carvedilol  25 mg Oral BID  . dorzolamide-timolol  1 drop Left Eye BID  . ferrous sulfate  325 mg Oral QODAY  . furosemide  80 mg Intravenous BID  . glipiZIDE  10 mg Oral BID AC  . hydrALAZINE  100 mg Oral TID  . insulin aspart  0-5 Units Subcutaneous QHS  . insulin aspart  0-9 Units Subcutaneous TID WC  . insulin glargine  4 Units Subcutaneous Daily  . isosorbide dinitrate  20 mg Oral TID  . ketorolac  1 drop Right Eye QID  . ofloxacin  1 drop Right Eye TID  . ticagrelor  90 mg Oral BID  . Warfarin - Pharmacist Dosing Inpatient   Does not apply q1800    Assessment: 63 yo female presented on 06/29/2019 with SOB and CHF exacerbation. Patient has a history of Afib and is on warfarin PTA. Pharmacy consulted to dose warfarin for Afib and heparin while INR is subtherapeutic. INR of 1.1 on admission is subtherapeutic. Patient's daughter reports last dose of warfarin 7.5mg  on 11/18. CHAD2-VASc score of 7. Hgb 10.2. Plt 334. No reported bleeding.   Warfarin PTA regimen: warfarin 7.5mg  on Mondays, Wednesdays, and Fridays and warfarin 5mg  all other days.  11/20 AM update: initial heparin level elevated, no issues per RN  Goal of Therapy:  INR 2-3 Heparin level 0.3-0.7 units/ml Monitor platelets by anticoagulation protocol: Yes   Plan:  Dec heparin to 1000 units/hr Re-check heparin level in 8  hours  Narda Bonds, PharmD, [place] Pharmacist Phone: [phone]

## 2019-06-30 NOTE — Progress Notes (Signed)
ANTICOAGULATION CONSULT NOTE  Pharmacy Consult:  Heparin / Coumadin Indication: atrial fibrillation  Allergies  Allergen Reactions  . Ace Inhibitors Cough    Patient Measurements: Height: 5\' 7"  (170.2 cm) Weight: 176 lb 11.2 oz (80.2 kg) IBW/kg (Calculated) : 61.6 Heparin Dosing Weight: 76.8 kg   Vital Signs: Temp: 98.1 F (36.7 C) (11/20 1145) Temp Source: Oral (11/20 1145) BP: 155/73 (11/20 1145) Pulse Rate: 57 (11/20 1145)  Labs: Recent Labs    06/29/19 1542 06/29/19 1648 06/29/19 1824 06/30/19 0234 06/30/19 1243  HGB  --  10.2*  --  8.8*  --   HCT  --  32.8*  --  28.1*  --   PLT  --  334  --  285  --   APTT  --   --  29  --   --   LABPROT  --   --   --  14.6  --   INR 1.1*  --   --  1.2  --   HEPARINUNFRC  --   --   --  0.82* 0.64  CREATININE  --  4.32*  --  4.18*  --     Estimated Creatinine Clearance: 15 mL/min (A) (by C-G formula based on SCr of 4.18 mg/dL (H)).   Assessment: 63 yo female presented on 06/29/2019 with SOB and CHF exacerbation. Patient has a history of Afib and on Coumadin PTA. Pharmacy consulted bridge IV heparin to Coumadin given sub-therapeutic INR on admit.   Heparin level is therapeutic and toward the high end of normal.  INR is sub-therapeutic.  No bleeding reported.  Home Coumadin regimen: 5mg  daily except 7.5mg  on MWF  Goal of Therapy:  INR 2-3 Heparin level 0.3-0.7 units/ml Monitor platelets by anticoagulation protocol: Yes   Plan:  Decrease heparin gtt slightly to 950 units/hr Repeat Coumadin 10mg  PO today Daily heparin level, PT / INR and CBC  Noland Pizano D. Mina Marble, PharmD, BCPS, [place] 06/30/2019, 1:07 PM

## 2019-07-01 ENCOUNTER — Inpatient Hospital Stay (HOSPITAL_COMMUNITY): Payer: Medicare Other

## 2019-07-01 DIAGNOSIS — I361 Nonrheumatic tricuspid (valve) insufficiency: Secondary | ICD-10-CM

## 2019-07-01 DIAGNOSIS — I340 Nonrheumatic mitral (valve) insufficiency: Secondary | ICD-10-CM

## 2019-07-01 DIAGNOSIS — I5023 Acute on chronic systolic (congestive) heart failure: Secondary | ICD-10-CM

## 2019-07-01 LAB — CBC
HCT: 26.2 % — ABNORMAL LOW (ref 36.0–46.0)
Hemoglobin: 8.6 g/dL — ABNORMAL LOW (ref 12.0–15.0)
MCH: 24.5 pg — ABNORMAL LOW (ref 26.0–34.0)
MCHC: 32.8 g/dL (ref 30.0–36.0)
MCV: 74.6 fL — ABNORMAL LOW (ref 80.0–100.0)
Platelets: 271 10*3/uL (ref 150–400)
RBC: 3.51 MIL/uL — ABNORMAL LOW (ref 3.87–5.11)
RDW: 16 % — ABNORMAL HIGH (ref 11.5–15.5)
WBC: 4.9 10*3/uL (ref 4.0–10.5)
nRBC: 0 % (ref 0.0–0.2)

## 2019-07-01 LAB — GLUCOSE, CAPILLARY
Glucose-Capillary: 137 mg/dL — ABNORMAL HIGH (ref 70–99)
Glucose-Capillary: 171 mg/dL — ABNORMAL HIGH (ref 70–99)
Glucose-Capillary: 182 mg/dL — ABNORMAL HIGH (ref 70–99)
Glucose-Capillary: 215 mg/dL — ABNORMAL HIGH (ref 70–99)
Glucose-Capillary: 58 mg/dL — ABNORMAL LOW (ref 70–99)
Glucose-Capillary: 63 mg/dL — ABNORMAL LOW (ref 70–99)
Glucose-Capillary: 74 mg/dL (ref 70–99)
Glucose-Capillary: 86 mg/dL (ref 70–99)
Glucose-Capillary: 86 mg/dL (ref 70–99)
Glucose-Capillary: 90 mg/dL (ref 70–99)

## 2019-07-01 LAB — BASIC METABOLIC PANEL
Anion gap: 11 (ref 5–15)
BUN: 63 mg/dL — ABNORMAL HIGH (ref 8–23)
CO2: 25 mmol/L (ref 22–32)
Calcium: 8.2 mg/dL — ABNORMAL LOW (ref 8.9–10.3)
Chloride: 101 mmol/L (ref 98–111)
Creatinine, Ser: 4.73 mg/dL — ABNORMAL HIGH (ref 0.44–1.00)
GFR calc Af Amer: 11 mL/min — ABNORMAL LOW (ref >60)
GFR calc non Af Amer: 9 mL/min — ABNORMAL LOW (ref >60)
Glucose, Bld: 63 mg/dL — ABNORMAL LOW (ref 70–99)
Potassium: 3.3 mmol/L — ABNORMAL LOW (ref 3.5–5.1)
Sodium: 137 mmol/L (ref 135–145)

## 2019-07-01 LAB — ECHOCARDIOGRAM COMPLETE
Height: 67 in
Weight: 2804.8 oz

## 2019-07-01 LAB — HEPARIN LEVEL (UNFRACTIONATED): Heparin Unfractionated: 0.49 IU/mL (ref 0.30–0.70)

## 2019-07-01 LAB — PROTIME-INR
INR: 1.3 — ABNORMAL HIGH (ref 0.8–1.2)
Prothrombin Time: 16.2 seconds — ABNORMAL HIGH (ref 11.4–15.2)

## 2019-07-01 MED ORDER — WARFARIN SODIUM 10 MG PO TABS
10.0000 mg | ORAL_TABLET | Freq: Once | ORAL | Status: AC
  Administered 2019-07-01: 10 mg via ORAL
  Filled 2019-07-01: qty 1

## 2019-07-01 MED ORDER — NITROGLYCERIN IN D5W 200-5 MCG/ML-% IV SOLN
0.0000 ug/min | INTRAVENOUS | Status: DC
  Administered 2019-07-01: 15 ug/min via INTRAVENOUS
  Administered 2019-07-01: 11:00:00 5 ug/min via INTRAVENOUS
  Administered 2019-07-01: 17:00:00 10 ug/min via INTRAVENOUS
  Filled 2019-07-01: qty 250

## 2019-07-01 MED ORDER — FUROSEMIDE 10 MG/ML IJ SOLN
80.0000 mg | Freq: Three times a day (TID) | INTRAMUSCULAR | Status: DC
  Administered 2019-07-01 – 2019-07-02 (×3): 80 mg via INTRAVENOUS
  Filled 2019-07-01 (×3): qty 8

## 2019-07-01 MED ORDER — METOLAZONE 2.5 MG PO TABS
2.5000 mg | ORAL_TABLET | Freq: Once | ORAL | Status: AC
  Administered 2019-07-01: 11:00:00 2.5 mg via ORAL
  Filled 2019-07-01: qty 1

## 2019-07-01 MED ORDER — POTASSIUM CHLORIDE CRYS ER 20 MEQ PO TBCR
40.0000 meq | EXTENDED_RELEASE_TABLET | Freq: Two times a day (BID) | ORAL | Status: AC
  Administered 2019-07-01 (×2): 40 meq via ORAL
  Filled 2019-07-01 (×2): qty 2

## 2019-07-01 NOTE — Progress Notes (Signed)
ANTICOAGULATION CONSULT NOTE  Pharmacy Consult:  Heparin / Coumadin Indication: atrial fibrillation  Allergies  Allergen Reactions  . Ace Inhibitors Cough    Patient Measurements: Height: 5\' 7"  (170.2 cm) Weight: 175 lb 4.8 oz (79.5 kg) IBW/kg (Calculated) : 61.6 Heparin Dosing Weight: 76.8 kg   Vital Signs: Temp: 98.1 F (36.7 C) (11/21 0529) Temp Source: Oral (11/21 0529) BP: 161/74 (11/21 0529) Pulse Rate: 63 (11/21 0529)  Labs: Recent Labs    06/29/19 1542  06/29/19 1648 06/29/19 1824 06/30/19 0234 06/30/19 1243 07/01/19 0350  HGB  --    < > 10.2*  --  8.8*  --  8.6*  HCT  --   --  32.8*  --  28.1*  --  26.2*  PLT  --   --  334  --  285  --  271  APTT  --   --   --  29  --   --   --   LABPROT  --   --   --   --  14.6  --  16.2*  INR 1.1*  --   --   --  1.2  --  1.3*  HEPARINUNFRC  --   --   --   --  0.82* 0.64 0.49  CREATININE  --   --  4.32*  --  4.18*  --  4.73*   < > = values in this interval not displayed.    Estimated Creatinine Clearance: 13.2 mL/min (A) (by C-G formula based on SCr of 4.73 mg/dL (H)).   Assessment: 63 yo female presented on 06/29/2019 with SOB and CHF exacerbation. Patient has a history of Afib and on Coumadin PTA. Pharmacy consulted bridge IV heparin to Coumadin given sub-therapeutic INR on admit.   Heparin level is therapeutic, continue rate. INR starting to uptrend slowly.  No bleeding reported.  Home Coumadin regimen: 5mg  daily except 7.5mg  on MWF  Goal of Therapy:  INR 2-3 Heparin level 0.3-0.7 units/ml Monitor platelets by anticoagulation protocol: Yes   Plan:  Continue heparin gtt 950 units/hr Repeat Coumadin 10mg  PO today Daily heparin level, PT / INR and CBC Monitor for signs/symptoms of bleeding    Benetta Spar, PharmD, BCPS, BCCP Clinical Pharmacist  Please check AMION for all [place] phone numbers After 10:00 PM, call [place]

## 2019-07-01 NOTE — Progress Notes (Signed)
  Echocardiogram 2D Echocardiogram has been performed.  Amber Young 07/01/2019, 10:45 AM

## 2019-07-01 NOTE — Progress Notes (Signed)
Progress Note  Patient Name: Amber Young Date of Encounter: 07/01/2019  Primary Cardiologist: Skeet Latch, MD   Subjective   Ongoing SOB  Inpatient Medications    Scheduled Meds: . amLODipine  10 mg Oral Daily  . atorvastatin  40 mg Oral q1800  . brimonidine  1 drop Left Eye BID  . carvedilol  25 mg Oral BID  . clopidogrel  75 mg Oral Daily  . dorzolamide-timolol  1 drop Left Eye BID  . ferrous sulfate  325 mg Oral QODAY  . furosemide  80 mg Intravenous BID  . hydrALAZINE  100 mg Oral TID  . insulin aspart  0-9 Units Subcutaneous Q4H  . isosorbide dinitrate  20 mg Oral TID  . ketorolac  1 drop Right Eye QID  . ofloxacin  1 drop Right Eye TID  . warfarin  10 mg Oral ONCE-1800  . Warfarin - Pharmacist Dosing Inpatient   Does not apply q1800   Continuous Infusions: . heparin 950 Units/hr (06/30/19 1854)   PRN Meds: acetaminophen, ALPRAZolam, nitroGLYCERIN, ondansetron (ZOFRAN) IV, zolpidem   Vital Signs    Vitals:   07/01/19 0058 07/01/19 0529 07/01/19 0655 07/01/19 0826  BP: (!) 154/71 (!) 161/74  (!) 184/79  Pulse: (!) 57 63  67  Resp: 19 18  16   Temp: 97.8 F (36.6 C) 98.1 F (36.7 C)  98.1 F (36.7 C)  TempSrc: Oral Oral  Oral  SpO2: 98% 98%  100%  Weight:   79.5 kg   Height:        Intake/Output Summary (Last 24 hours) at 07/01/2019 0900 Last data filed at 07/01/2019 0846 Gross per 24 hour  Intake 1592.43 ml  Output 1750 ml  Net -157.57 ml   Last 3 Weights 07/01/2019 06/30/2019 06/29/2019  Weight (lbs) 175 lb 4.8 oz 176 lb 11.2 oz 178 lb 6.4 oz  Weight (kg) 79.516 kg 80.151 kg 80.922 kg      Telemetry    SR- Personally Reviewed  ECG    n/a - Personally Reviewed  Physical Exam   GEN: No acute distress.   Neck: elevated JVD Cardiac: RRR, no murmurs, rubs, or gallops.  Respiratory: bilateral crackles GI: Soft, nontender, non-distended  MS: 2+ bilateral LE edema; No deformity. Neuro:  Nonfocal  Psych: Normal affect   Labs     High Sensitivity Troponin:   Recent Labs  Lab 06/15/19 0009 06/15/19 1748 06/16/19 0323 06/16/19 0550 06/16/19 0926  TROPONINIHS 76* 70* 86* 91* 96*      Chemistry Recent Labs  Lab 06/29/19 1648 06/30/19 0234 07/01/19 0350  NA 135 135 137  K 4.2 3.4* 3.3*  CL 103 101 101  CO2 23 23 25   GLUCOSE 197* 137* 63*  BUN 59* 59* 63*  CREATININE 4.32* 4.18* 4.73*  CALCIUM 8.6* 8.5* 8.2*  PROT 6.3*  --   --   ALBUMIN 2.7*  --   --   AST 20  --   --   ALT 31  --   --   ALKPHOS 104  --   --   BILITOT 0.6  --   --   GFRNONAA 10* 11* 9*  GFRAA 12* 12* 11*  ANIONGAP 9 11 11      Hematology Recent Labs  Lab 06/29/19 1648 06/30/19 0234 06/30/19 0939 07/01/19 0350  WBC 4.9 4.9  --  4.9  RBC 4.23 3.69* 3.52* 3.51*  HGB 10.2* 8.8*  --  8.6*  HCT 32.8* 28.1*  --  26.2*  MCV 77.5* 76.2*  --  74.6*  MCH 24.1* 23.8*  --  24.5*  MCHC 31.1 31.3  --  32.8  RDW 16.7* 16.3*  --  16.0*  PLT 334 285  --  271    BNP Recent Labs  Lab 06/29/19 2211  BNP 2,340.4*     DDimer No results for input(s): DDIMER in the last 168 hours.   Radiology    Dg Chest 2 View  Result Date: 06/29/2019 CLINICAL DATA:  Shortness of breath EXAM: CHEST - 2 VIEW COMPARISON:  June 15, 2019 FINDINGS: No new consolidation or edema. No pleural effusion or pneumothorax. Stable mild cardiomegaly. Coronary artery stent is noted. No acute osseous abnormality. IMPRESSION: No acute process in the chest.  Stable mild cardiomegaly. Electronically Signed   By: Macy Mis M.D.   On: 06/29/2019 17:15    Cardiac Studies     Patient Profile     63 y.o. female with a history of HTN, DM, HLD, CVA, PAF on coumadin, GERD,anemia, DES x2 RCA 1/29/2019w/ med rx for 40% LAD, 2017 LAD stent ok, EF 40-45% by echo 08/2017 w/ grade 3 dd, S-D-CHF, CKD IV (Dr Posey Pronto), hx med noncompliance at times now admitted with acute CHF exacerbation  Assessment & Plan    1. Acute on chronic systolic HF - [id number] echo LVEF  25-30%, grade II diastolic dysfunction, mild RV dysfunction, mod TR - fairly benign CXR but BNP 2300 - neg 519mL yesterday, neg 1.5 L since admission. She is on IV lasix 80mg  bid, labile Cr.   - LVEF dropped from 07/2018 (40-45%) to 25-30% by 01/2019 echo. Did not have repeat ischemic testing at that time due to her renal function  - with woresning HF symptoms and difficultly diuresising will repeat echo. Is stable function then will consult nephrology, if worsening LV or RV function may need to consider more advanced heart failure therapties.   - current medical therapy with coreg 25mg  bid, hydral 100mg  tid, imdur 20 tid. Medical therapy limited by severe renal dysfunction - elevated bp's. On max hydral, will d/c oral nitrate and start nitro gtt - increase diuretics to lasix 80mg  tid and metolazone 2.5mg  once. Remains severely volume overloaded.   2. CKD IV - cr in June 3.52 more recently in the 4.53 range - f/u echo, likely consult nephorlogy pending results    3. CAD - overlapping DES 08/2017 and hx of stent to mLAD was patent on cath 08/2017 otherwise non obstructive disease. - review chart further to understand her antiplatelet regimen plans  4. Anemia -   Hgb on admit 10.2 and now 8.8 plts 285 --in 06/18/19 hgb was 9.5 (iron 01/2019 was 19 and iron sat 7 with hgb 11.2)     5. PAF - rate controlled. Has been on coumadin for anticoag - subetherapeutic INR, has been on hep gtt.   For questions or updates, please contact [place] Please consult [email or web address] for contact info under        Signed, Carlyle Dolly, MD  07/01/2019, 9:00 AM

## 2019-07-02 LAB — CBC
HCT: 28.8 % — ABNORMAL LOW (ref 36.0–46.0)
Hemoglobin: 9.2 g/dL — ABNORMAL LOW (ref 12.0–15.0)
MCH: 24.3 pg — ABNORMAL LOW (ref 26.0–34.0)
MCHC: 31.9 g/dL (ref 30.0–36.0)
MCV: 76 fL — ABNORMAL LOW (ref 80.0–100.0)
Platelets: 319 10*3/uL (ref 150–400)
RBC: 3.79 MIL/uL — ABNORMAL LOW (ref 3.87–5.11)
RDW: 16.3 % — ABNORMAL HIGH (ref 11.5–15.5)
WBC: 5.6 10*3/uL (ref 4.0–10.5)
nRBC: 0 % (ref 0.0–0.2)

## 2019-07-02 LAB — MAGNESIUM: Magnesium: 2 mg/dL (ref 1.7–2.4)

## 2019-07-02 LAB — PROTIME-INR
INR: 2 — ABNORMAL HIGH (ref 0.8–1.2)
Prothrombin Time: 22.8 seconds — ABNORMAL HIGH (ref 11.4–15.2)

## 2019-07-02 LAB — BASIC METABOLIC PANEL
Anion gap: 13 (ref 5–15)
BUN: 69 mg/dL — ABNORMAL HIGH (ref 8–23)
CO2: 25 mmol/L (ref 22–32)
Calcium: 8.8 mg/dL — ABNORMAL LOW (ref 8.9–10.3)
Chloride: 99 mmol/L (ref 98–111)
Creatinine, Ser: 4.56 mg/dL — ABNORMAL HIGH (ref 0.44–1.00)
GFR calc Af Amer: 11 mL/min — ABNORMAL LOW (ref >60)
GFR calc non Af Amer: 10 mL/min — ABNORMAL LOW (ref >60)
Glucose, Bld: 106 mg/dL — ABNORMAL HIGH (ref 70–99)
Potassium: 4.4 mmol/L (ref 3.5–5.1)
Sodium: 137 mmol/L (ref 135–145)

## 2019-07-02 LAB — GLUCOSE, CAPILLARY
Glucose-Capillary: 112 mg/dL — ABNORMAL HIGH (ref 70–99)
Glucose-Capillary: 117 mg/dL — ABNORMAL HIGH (ref 70–99)
Glucose-Capillary: 224 mg/dL — ABNORMAL HIGH (ref 70–99)
Glucose-Capillary: 207 mg/dL — ABNORMAL HIGH (ref 70–99)
Glucose-Capillary: 186 mg/dL — ABNORMAL HIGH (ref 70–99)

## 2019-07-02 LAB — HEPARIN LEVEL (UNFRACTIONATED): Heparin Unfractionated: 0.56 IU/mL (ref 0.30–0.70)

## 2019-07-02 MED ORDER — WARFARIN SODIUM 3 MG PO TABS
3.0000 mg | ORAL_TABLET | Freq: Once | ORAL | Status: AC
  Administered 2019-07-02: 3 mg via ORAL
  Filled 2019-07-02: qty 1

## 2019-07-02 MED ORDER — SODIUM CHLORIDE 0.9 % IV SOLN
INTRAVENOUS | Status: DC | PRN
  Administered 2019-07-02 – 2019-07-03 (×3): 250 mL via INTRAVENOUS
  Administered 2019-07-10 (×2): via INTRAVENOUS

## 2019-07-02 MED ORDER — FUROSEMIDE 10 MG/ML IJ SOLN
120.0000 mg | Freq: Three times a day (TID) | INTRAVENOUS | Status: DC
  Administered 2019-07-02 – 2019-07-03 (×3): 120 mg via INTRAVENOUS
  Filled 2019-07-02: qty 12
  Filled 2019-07-02 (×2): qty 10
  Filled 2019-07-02: qty 2
  Filled 2019-07-02: qty 12

## 2019-07-02 NOTE — Progress Notes (Signed)
Progress Note  Patient Name: Amber Young Date of Encounter: 07/02/2019  Primary Cardiologist: Amber Latch, MD   Subjective     Inpatient Medications    Scheduled Meds: . amLODipine  10 mg Oral Daily  . atorvastatin  40 mg Oral q1800  . brimonidine  1 drop Left Eye BID  . carvedilol  25 mg Oral BID  . clopidogrel  75 mg Oral Daily  . dorzolamide-timolol  1 drop Left Eye BID  . ferrous sulfate  325 mg Oral QODAY  . furosemide  80 mg Intravenous TID  . hydrALAZINE  100 mg Oral TID  . insulin aspart  0-9 Units Subcutaneous Q4H  . ketorolac  1 drop Right Eye QID  . ofloxacin  1 drop Right Eye TID  . Warfarin - Pharmacist Dosing Inpatient   Does not apply q1800   Continuous Infusions: . heparin 950 Units/hr (07/01/19 2249)  . nitroGLYCERIN 15 mcg/min (07/01/19 2315)   PRN Meds: acetaminophen, ALPRAZolam, nitroGLYCERIN, ondansetron (ZOFRAN) IV, zolpidem   Vital Signs    Vitals:   07/01/19 1800 07/01/19 2253 07/01/19 2300 07/02/19 0347  BP: (!) 147/63 (!) 169/84 136/60 (!) 153/74  Pulse: 62   (!) 56  Resp:      Temp:    97.8 F (36.6 C)  TempSrc:    Oral  SpO2:    100%  Weight:    78.9 kg  Height:        Intake/Output Summary (Last 24 hours) at 07/02/2019 0845 Last data filed at 07/02/2019 0827 Gross per 24 hour  Intake 1108.03 ml  Output 1925 ml  Net -816.97 ml   Last 3 Weights 07/02/2019 07/01/2019 06/30/2019  Weight (lbs) 174 lb 175 lb 4.8 oz 176 lb 11.2 oz  Weight (kg) 78.926 kg 79.516 kg 80.151 kg      Telemetry    SR - Personally Reviewed  ECG    n/a- Personally Reviewed  Physical Exam   GEN: No acute distress.   Neck: elevated JVD Cardiac: RRR, no murmurs, rubs, or gallops.  Respiratory: Clear to auscultation bilaterally. GI: Soft, nontender, non-distended  MS: 2+ bilateral LE edema; No deformity. Neuro:  Nonfocal  Psych: Normal affect   Labs    High Sensitivity Troponin:   Recent Labs  Lab 06/15/19 0009 06/15/19 1748  06/16/19 0323 06/16/19 0550 06/16/19 0926  TROPONINIHS 76* 70* 86* 91* 96*      Chemistry Recent Labs  Lab 06/29/19 1648 06/30/19 0234 07/01/19 0350 07/02/19 0358  NA 135 135 137 137  K 4.2 3.4* 3.3* 4.4  CL 103 101 101 99  CO2 23 23 25 25   GLUCOSE 197* 137* 63* 106*  BUN 59* 59* 63* 69*  CREATININE 4.32* 4.18* 4.73* 4.56*  CALCIUM 8.6* 8.5* 8.2* 8.8*  PROT 6.3*  --   --   --   ALBUMIN 2.7*  --   --   --   AST 20  --   --   --   ALT 31  --   --   --   ALKPHOS 104  --   --   --   BILITOT 0.6  --   --   --   GFRNONAA 10* 11* 9* 10*  GFRAA 12* 12* 11* 11*  ANIONGAP 9 11 11 13      Hematology Recent Labs  Lab 06/30/19 0234 06/30/19 0939 07/01/19 0350 07/02/19 0358  WBC 4.9  --  4.9 5.6  RBC 3.69* 3.52* 3.51* 3.79*  HGB  8.8*  --  8.6* 9.2*  HCT 28.1*  --  26.2* 28.8*  MCV 76.2*  --  74.6* 76.0*  MCH 23.8*  --  24.5* 24.3*  MCHC 31.3  --  32.8 31.9  RDW 16.3*  --  16.0* 16.3*  PLT 285  --  271 319    BNP Recent Labs  Lab 06/29/19 2211  BNP 2,340.4*     DDimer No results for input(s): DDIMER in the last 168 hours.   Radiology    No results found.  Cardiac Studies     Patient Profile     63 y.o.femalewith a history of HTN, DM, HLD, CVA, PAF on coumadin, GERD,anemia, DES x2 RCA 1/29/2019w/ med rx for 40% LAD, 2017 LAD stent ok, EF 40-45% by echo 08/2017 w/ grade 3 dd, S-D-CHF, CKD IV (Dr Amber Young), hx med noncompliance at [facility] admitted with acute CHF exacerbation  Assessment & Plan    1. Acute on chronic systolic HF - [id number] echo LVEF 25-30%, grade II diastolic dysfunction, mild RV dysfunction, mod TR - fairly benign CXR but BNP 2300 - neg 877 mL yesterday, neg 2.3 L since admission. She is on IV lasix 80mg  tid, given metolazone 2.5mg  once yesterday.   - LVEF dropped from 07/2018 (40-45%) to 25-30% by 01/2019 echo. Did not have repeat ischemic testing at that time due to her renal function  - repeat echo this admit LVEF 35-40%, grade II  diastolic dysfunction, normal RV function. I don't see a major change in her cardiac function as the etiology for her worsening renal failure and difficultly diuresising  - current medical therapy with coreg 25mg  bid, hydral 100mg  tid, nitro gtt. Medical therapy limited by severe renal dysfunction - elevated bp's, we started a nitro gtt for further afterload reduction   - neprhology consulted, we will ask for there assistance in diuretic dosing. For now continue her 80mg  IV tid.      2. CKD IV - cr in June 3.52 more recently in the 4.53 range - I have contacted nephrology, she typically follows with Dr Amber [facility]    3. CAD - overlapping DES 08/2017 and hx of stent to mLAD was patent on cath 08/2017 otherwise non obstructive disease.   4. Anemia -  Hgb on admit 10.2 and now 8.8 plts 285 --in 06/18/19 hgb was 9.5 (iron 01/2019 was 19 and iron sat 7 with hgb 11.2)  - stable   5. PAF - rate controlled. Has been on coumadin for anticoag - subetherapeutic INR, had been on hep gtt. INR 2 today, d/c heparin.   For questions or updates, please contact [place] Please consult [email or web address] for contact info under        Signed, Amber Dolly, MD  07/02/2019, 8:45 AM

## 2019-07-02 NOTE — Progress Notes (Signed)
ANTICOAGULATION CONSULT NOTE  Pharmacy Consult:  Heparin / Coumadin Indication: atrial fibrillation (CHADS2VASc = 7),  Patient Measurements: Height: 5\' 7"  (170.2 cm) Weight: 174 lb (78.9 kg) IBW/kg (Calculated) : 61.6 Heparin Dosing Weight: 76.8 kg   Vital Signs: Temp: 97.8 F (36.6 C) (11/22 0347) Temp Source: Oral (11/22 0347) BP: 153/74 (11/22 0347) Pulse Rate: 56 (11/22 0347)  Labs: Recent Labs    06/29/19 1824  06/30/19 0234 06/30/19 1243 07/01/19 0350 07/02/19 0358  HGB  --   --  8.8*  --  8.6* 9.2*  HCT  --   --  28.1*  --  26.2* 28.8*  PLT  --   --  285  --  271 319  APTT 29  --   --   --   --   --   LABPROT  --   --  14.6  --  16.2* 22.8*  INR  --   --  1.2  --  1.3* 2.0*  HEPARINUNFRC  --    < > 0.82* 0.64 0.49 0.56  CREATININE  --   --  4.18*  --  4.73* 4.56*   < > = values in this interval not displayed.    Estimated Creatinine Clearance: 13.7 mL/min (A) (by C-G formula based on SCr of 4.56 mg/dL (H)).   Assessment: 63 yo female presented on 06/29/2019 with SOB and CHF exacerbation. Patient has a history of Afib and on Coumadin PTA. Pharmacy consulted bridge IV heparin to Coumadin given sub-therapeutic INR on admit.   Heparin level is therapeutic. Will stop heparin given INR therapeutic, MD agrees. INR jumped to 2.0, will dose conservatively tonight as patient has received 10mg  x3 and anticipate continued uptrend. Home dose average is 6mg  per day; will give 3mg  today so avg dose is 6.5mg  between 11/21-22. H/H & plt stable.  Home Coumadin regimen: 5mg  TuThSS, 7.5mg  on MWF  Goal of Therapy:  INR 2-3 Monitor platelets by anticoagulation protocol: Yes   Plan:  Stop heparin gtt and HL  Warfarin 3mg  PO today Daily PT / INR and CBC Monitor for signs/symptoms of bleeding    Benetta Spar, PharmD, BCPS, BCCP Clinical Pharmacist  Please check AMION for all [place] phone numbers After 10:00 PM, call [place]

## 2019-07-02 NOTE — Consult Note (Signed)
[facility]  HISTORY AND PHYSICAL  Amber Young is an 63 y.o. female.    Chief Complaint: SOB  HPI: Pt is a 3F with a PMH sig for HTN, HLD, Afib, CHF with EF 25-30%, CKD with baseline Cr around 3 who is now seen in consultation at the request of Dr. Harl Bowie for evaluation and recommendations surrounding AKI on CKD IV. Typically follows with Dr. Posey Pronto.  She was recently admitted 11/5-11/8 with AKI on CKD and CHF exac.  D/c Cr was 4.5 at that time, usually baseline is around 3.5.  Losartan/ Lasix/ metolazone held at d/c.  Presented back to CHF clinic 11/19 with increased LE edema, weights up, and DOE.  She was admitted.  Started on IV Lasix 80 TID.  Has been making about 2L urine daily.  Weights have been slowly going down but are only down about 4lbs.  Metolazone has been held today.    Cr 4.3 on admission, is 4.5 today.  In this setting we are asked to see.  Pt reports her legs feel tight and uncomfortable.  BP is high.  On a nitro gtt now.  She denies CP.  No HA/ blurry vision.    PMH: Past Medical History:  Diagnosis Date  . Anemia   . Atrial fibrillation ([place]) 06/17/2019  . Chronic combined systolic and diastolic CHF (congestive heart failure) ([place])    a. EF 40-45% in 2019 b. EF 25-30% by repeat echo in 01/2019  . CKD (chronic kidney disease), stage IV ([place])   . Coronary artery disease 08/2017   DES x 2 RCA  . DKA (diabetic ketoacidoses) ([place]) 06/16/2019  . GERD (gastroesophageal reflux disease)   . Heart murmur   . Hyperlipidemia   . Hypertension   . 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 Stroke ([place]) 09/2014   numbness left upper lip, finger tips on left hand; "resolved" (05/18/2016)  . Type II diabetes mellitus ([place])   . Wears glasses    PSH: Past Surgical History:  Procedure Laterality Date  . CARDIAC CATHETERIZATION N/A 05/18/2016   Procedure: Left Heart Cath and Coronary  Angiography;  Surgeon: Jettie Booze, MD;  Location: [facility];  Service: Cardiovascular;  Laterality: N/A;  . CARDIAC CATHETERIZATION N/A 05/18/2016   Procedure: Coronary Stent Intervention;  Surgeon: Jettie Booze, MD;  Location: [facility];  Service: Cardiovascular;  Laterality: N/A;  . CATARACT EXTRACTION Right 2020  . COLONOSCOPY W/ BIOPSIES AND POLYPECTOMY    . CORONARY ANGIOPLASTY    . CORONARY ANGIOPLASTY WITH STENT PLACEMENT    . CORONARY STENT INTERVENTION N/A 09/07/2017   Procedure: CORONARY STENT INTERVENTION;  Surgeon: Leonie Man, MD;  Location: [facility];  Service: Cardiovascular;  Laterality: N/A;  . EYE SURGERY    . GAS INSERTION Left 01/13/2018   Procedure: INSERTION OF GAS;  Surgeon: Bernarda Caffey, MD;  Location: [facility];  Service: Ophthalmology;  Laterality: Left;  . LEFT HEART CATH AND CORONARY ANGIOGRAPHY N/A 09/07/2017   Procedure: LEFT HEART CATH AND CORONARY ANGIOGRAPHY;  Surgeon: Leonie Man, MD;  Location: [facility];  Service: Cardiovascular;  Laterality: N/A;  . MEMBRANE PEEL Left 01/13/2018   Procedure: MEMBRANE PEEL LEFT EYE ;  Surgeon: Bernarda Caffey, MD;  Location: [facility];  Service: Ophthalmology;  Laterality: Left;  Marland Kitchen MULTIPLE TOOTH EXTRACTIONS    . PARS PLANA VITRECTOMY Left 01/13/2018  Procedure: PARS PLANA VITRECTOMY WITH 25 GAUGE LEFT EYE WITH ENDOLASER;  Surgeon: Bernarda Caffey, MD;  Location: [facility];  Service: Ophthalmology;  Laterality: Left;  . TUBAL LIGATION  1980  . VAGINAL HYSTERECTOMY  1999   "partial; fibroids"    Past Medical History:  Diagnosis Date  . Anemia   . Atrial fibrillation ([place]) 06/17/2019  . Chronic combined systolic and diastolic CHF (congestive heart failure) ([place])    a. EF 40-45% in 2019 b. EF 25-30% by repeat echo in 01/2019  . CKD (chronic kidney disease), stage IV ([place])   . Coronary artery disease 08/2017   DES x 2 RCA  . DKA (diabetic ketoacidoses) ([place]) 06/16/2019  . GERD  (gastroesophageal reflux disease)   . Heart murmur   . Hyperlipidemia   . Hypertension   . Proliferative diabetic retinopathy ([place])    left eye with vitreous hemorrhage and tractional retinal detachment  . Stroke ([place]) 09/2014   numbness left upper lip, finger tips on left hand; "resolved" (05/18/2016)  . Type II diabetes mellitus ([place])   . Wears glasses     Medications:   Scheduled: . amLODipine  10 mg Oral Daily  . atorvastatin  40 mg Oral q1800  . brimonidine  1 drop Left Eye BID  . carvedilol  25 mg Oral BID  . clopidogrel  75 mg Oral Daily  . dorzolamide-timolol  1 drop Left Eye BID  . ferrous sulfate  325 mg Oral QODAY  . furosemide  80 mg Intravenous TID  . hydrALAZINE  100 mg Oral TID  . insulin aspart  0-9 Units Subcutaneous Q4H  . ketorolac  1 drop Right Eye QID  . ofloxacin  1 drop Right Eye TID  . warfarin  3 mg Oral ONCE-1800  . Warfarin - Pharmacist Dosing Inpatient   Does not apply q1800    Medications Prior to Admission  Medication Sig Dispense Refill  . acetaminophen (TYLENOL) 500 MG tablet Take 1,000 mg by mouth every 6 (six) hours as needed for mild pain or headache.    Marland Kitchen amLODipine (NORVASC) 10 MG tablet Take 1 tablet (10 mg total) by mouth daily. 90 tablet 1  . atorvastatin (LIPITOR) 40 MG tablet Take 1 tablet (40 mg total) by mouth daily at 6 PM. 90 tablet 1  . brimonidine (ALPHAGAN) 0.2 % ophthalmic solution Place 1 drop into the left eye 2 (two) times daily.    . carvedilol (COREG) 25 MG tablet Take 1 tablet (25 mg total) by mouth 2 (two) times daily. 180 tablet 1  . dorzolamide-timolol (COSOPT) 22.3-6.8 MG/ML ophthalmic solution Place 1 drop into the left eye 2 (two) times daily.    . ferrous sulfate (FERROUSUL) 325 (65 FE) MG tablet Take 1 tablet (325 mg total) by mouth every other day. 90 tablet 1  . furosemide (LASIX) 80 MG tablet Take 80 mg by mouth daily.     Marland Kitchen glipiZIDE (GLUCOTROL) 10 MG tablet Take 1 tablet (10 mg total) by mouth 2 (two) times  daily before a meal. 180 tablet 1  . hydrALAZINE (APRESOLINE) 100 MG tablet Take 1 tablet (100 mg total) by mouth 3 (three) times daily. 270 tablet 1  . Insulin Glargine (LANTUS) 100 UNIT/ML Solostar Pen Inject 4 Units into the skin daily. 9 mL 1  . Insulin Pen Needle (TRUEPLUS PEN NEEDLES) 31G X 6 MM MISC USE AS DIRECTED 300 each 1  . isosorbide dinitrate (ISORDIL) 20 MG tablet Take 1 tablet (20 mg total) by  mouth 3 (three) times daily. 270 tablet 1  . ketorolac (ACULAR) 0.4 % SOLN Place 1 drop into the right eye 4 (four) times daily.    . nitroGLYCERIN (NITROSTAT) 0.4 MG SL tablet Place 1 tablet (0.4 mg total) under the tongue every 5 (five) minutes x 3 doses as needed for chest pain. 90 tablet 1  . ofloxacin (OCUFLOX) 0.3 % ophthalmic solution Place 1 drop into the right eye 3 (three) times daily.    . ticagrelor (BRILINTA) 90 MG TABS tablet Take 1 tablet (90 mg total) by mouth 2 (two) times daily. 180 tablet 1  . warfarin (COUMADIN) 5 MG tablet Take 5mg  daily except for 7.5mg  every Monday, Wednesday and Friday (Patient taking differently: Take 5-7.5 mg by mouth See admin instructions. Take 5mg  daily except for 7.5mg  every Monday, Wednesday and Friday) 90 tablet 1    ALLERGIES:   Allergies  Allergen Reactions  . Ace Inhibitors Cough    FAM HX: Family History  Problem Relation Age of Onset  . Diabetes Mother   . Hypertension Mother   . COPD Mother   . Heart failure Mother   . Hypertension Sister   . Hypertension Brother     Social History:   reports that she has never smoked. She has never used smokeless tobacco. She reports that she does not drink alcohol or use drugs.  ROS: ROS: all other systems reviewed and are negative except as per HPI  Blood pressure (!) 174/74, pulse 60, temperature 97.8 F (36.6 C), temperature source Oral, resp. rate 18, height 5\' 7"  (1.702 m), weight 78.9 kg, SpO2 100 %. PHYSICAL EXAM: Physical Exam  GEN: NAD, sitting in in chair HEENT EOMI  PERRL NECK + JVD PULM normal WOB, clear bilaterally, some diminished breath sounds at bases CV irregular ? Soft S3 ABD slightly distended EXT3+ LE edema NEURO AAO x 3 no asterixis SKIN no rashes or lesions   Results for orders placed or performed during the hospital encounter of 06/29/19 (from the past 48 hour(s))  Heparin level (unfractionated)     Status: None   Collection Time: 06/30/19 12:43 PM  Result Value Ref Range   Heparin Unfractionated 0.64 0.30 - 0.70 IU/mL    Comment: (NOTE) If heparin results are below expected values, and patient dosage has  been confirmed, suggest follow up testing of antithrombin III levels. Performed at [facility], [place] [address], [place], [place] [address]   Glucose, capillary     Status: Abnormal   Collection Time: 06/30/19  4:09 PM  Result Value Ref Range   Glucose-Capillary 156 (H) 70 - 99 mg/dL  Glucose, capillary     Status: Abnormal   Collection Time: 06/30/19  9:05 PM  Result Value Ref Range   Glucose-Capillary 171 (H) 70 - 99 mg/dL  Glucose, capillary     Status: None   Collection Time: 07/01/19 12:13 AM  Result Value Ref Range   Glucose-Capillary 86 70 - 99 mg/dL  Basic metabolic panel     Status: Abnormal   Collection Time: 07/01/19  3:50 AM  Result Value Ref Range   Sodium 137 135 - 145 mmol/L   Potassium 3.3 (L) 3.5 - 5.1 mmol/L   Chloride 101 98 - 111 mmol/L   CO2 25 22 - 32 mmol/L   Glucose, Bld 63 (L) 70 - 99 mg/dL   BUN 63 (H) 8 - 23 mg/dL   Creatinine, Ser 4.73 (H) 0.44 - 1.00 mg/dL   Calcium  8.2 (L) 8.9 - 10.3 mg/dL   GFR calc non Af Amer 9 (L) >60 mL/min   GFR calc Af Amer 11 (L) >60 mL/min   Anion gap 11 5 - 15    Comment: Performed at [facility] [address] [place], [place] [address]  Protime-INR     Status: Abnormal   Collection Time: 07/01/19  3:50 AM  Result Value Ref Range   Prothrombin Time 16.2 (H) 11.4 - 15.2 seconds   INR 1.3 (H) 0.8 - 1.2    Comment: (NOTE) INR goal varies based  on device and disease states. Performed at [facility], [place] [address] [place], [place] [address]   CBC     Status: Abnormal   Collection Time: 07/01/19  3:50 AM  Result Value Ref Range   WBC 4.9 4.0 - 10.5 K/uL   RBC 3.51 (L) 3.87 - 5.11 MIL/uL   Hemoglobin 8.6 (L) 12.0 - 15.0 g/dL    Comment: Reticulocyte Hemoglobin testing may be clinically indicated, consider ordering this additional test [id number]    HCT 26.2 (L) 36.0 - 46.0 %   MCV 74.6 (L) 80.0 - 100.0 fL   MCH 24.5 (L) 26.0 - 34.0 pg   MCHC 32.8 30.0 - 36.0 g/dL   RDW 16.0 (H) 11.5 - 15.5 %   Platelets 271 150 - 400 K/uL   nRBC 0.0 0.0 - 0.2 %    Comment: Performed at [facility], [place] [address], [place], [place] [address]  Heparin level (unfractionated)     Status: None   Collection Time: 07/01/19  3:50 AM  Result Value Ref Range   Heparin Unfractionated 0.49 0.30 - 0.70 IU/mL    Comment: (NOTE) If heparin results are below expected values, and patient dosage has  been confirmed, suggest follow up testing of antithrombin III levels. Performed at [facility], [place] [address] [place], [place] [address]   Glucose, capillary     Status: Abnormal   Collection Time: 07/01/19  4:18 AM  Result Value Ref Range   Glucose-Capillary 58 (L) 70 - 99 mg/dL  Glucose, capillary     Status: None   Collection Time: 07/01/19  4:51 AM  Result Value Ref Range   Glucose-Capillary 74 70 - 99 mg/dL  Glucose, capillary     Status: Abnormal   Collection Time: 07/01/19  7:33 AM  Result Value Ref Range   Glucose-Capillary 63 (L) 70 - 99 mg/dL  Glucose, capillary     Status: None   Collection Time: 07/01/19  8:28 AM  Result Value Ref Range   Glucose-Capillary 90 70 - 99 mg/dL  Glucose, capillary     Status: None   Collection Time: 07/01/19 11:42 AM  Result Value Ref Range   Glucose-Capillary 86 70 - 99 mg/dL  Glucose, capillary     Status: Abnormal   Collection Time: 07/01/19  4:16 PM  Result Value Ref Range    Glucose-Capillary 215 (H) 70 - 99 mg/dL  Glucose, capillary     Status: Abnormal   Collection Time: 07/01/19  8:46 PM  Result Value Ref Range   Glucose-Capillary 182 (H) 70 - 99 mg/dL  Glucose, capillary     Status: Abnormal   Collection Time: 07/01/19 11:35 PM  Result Value Ref Range   Glucose-Capillary 137 (H) 70 - 99 mg/dL  Glucose, capillary     Status: Abnormal   Collection Time: 07/02/19  3:45 AM  Result Value Ref  Range   Glucose-Capillary 112 (H) 70 - 99 mg/dL  Basic metabolic panel     Status: Abnormal   Collection Time: 07/02/19  3:58 AM  Result Value Ref Range   Sodium 137 135 - 145 mmol/L   Potassium 4.4 3.5 - 5.1 mmol/L   Chloride 99 98 - 111 mmol/L   CO2 25 22 - 32 mmol/L   Glucose, Bld 106 (H) 70 - 99 mg/dL   BUN 69 (H) 8 - 23 mg/dL   Creatinine, Ser 4.56 (H) 0.44 - 1.00 mg/dL   Calcium 8.8 (L) 8.9 - 10.3 mg/dL   GFR calc non Af Amer 10 (L) >60 mL/min   GFR calc Af Amer 11 (L) >60 mL/min   Anion gap 13 5 - 15    Comment: Performed at [facility] [address], [place] [employer], [place] [address]  Protime-INR     Status: Abnormal   Collection Time: 07/02/19  3:58 AM  Result Value Ref Range   Prothrombin Time 22.8 (H) 11.4 - 15.2 seconds   INR 2.0 (H) 0.8 - 1.2    Comment: (NOTE) INR goal varies based on device and disease states. Performed at [facility], [place] [address], [place], [place] [address]   Heparin level (unfractionated)     Status: None   Collection Time: 07/02/19  3:58 AM  Result Value Ref Range   Heparin Unfractionated 0.56 0.30 - 0.70 IU/mL    Comment: (NOTE) If heparin results are below expected values, and patient dosage has  been confirmed, suggest follow up testing of antithrombin III levels. Performed at [facility], [place] [address] [place], [place] [address]   CBC     Status: Abnormal   Collection Time: 07/02/19  3:58 AM  Result Value Ref Range   WBC 5.6 4.0 - 10.5 K/uL   RBC 3.79 (L) 3.87 - 5.11 MIL/uL   Hemoglobin  9.2 (L) 12.0 - 15.0 g/dL   HCT 28.8 (L) 36.0 - 46.0 %   MCV 76.0 (L) 80.0 - 100.0 fL   MCH 24.3 (L) 26.0 - 34.0 pg   MCHC 31.9 30.0 - 36.0 g/dL   RDW 16.3 (H) 11.5 - 15.5 %   Platelets 319 150 - 400 K/uL   nRBC 0.0 0.0 - 0.2 %    Comment: Performed at [facility], [place] [address], [place], [place] [address]  Magnesium     Status: None   Collection Time: 07/02/19  3:58 AM  Result Value Ref Range   Magnesium 2.0 1.7 - 2.4 mg/dL    Comment: Performed at [facility] [address], [place],  [address]  Glucose, capillary     Status: Abnormal   Collection Time: 07/02/19  7:46 AM  Result Value Ref Range   Glucose-Capillary 117 (H) 70 - 99 mg/dL    No results found.  Assessment/Plan  1.  AKI on CKD IV:  Multiple recent insults; baseline 3.5 in June and now with recent admission for DKA where d/c Cr was 4.6 11/8.  Now with difficulty getting fluid off. Discussed with pt-- she follows with Dr Posey Pronto and has discussed dialysis before.  I'll increase her Lasix to 120 mg IV TID and follow.  If we don't make good headway then she'll need to start dialysis.  Discussed with her and she understands.  2.  Acute on chronic CHF: EF 25-30%.  Cardiology following.  3.  DM II: recent admission for DKA  4.  Afib: on Coumadin.  Madelon Lips 07/02/2019, 11:47 AM

## 2019-07-02 NOTE — Plan of Care (Signed)

## 2019-07-03 DIAGNOSIS — I480 Paroxysmal atrial fibrillation: Secondary | ICD-10-CM

## 2019-07-03 DIAGNOSIS — I2510 Atherosclerotic heart disease of native coronary artery without angina pectoris: Secondary | ICD-10-CM

## 2019-07-03 DIAGNOSIS — N179 Acute kidney failure, unspecified: Secondary | ICD-10-CM

## 2019-07-03 DIAGNOSIS — I10 Essential (primary) hypertension: Secondary | ICD-10-CM

## 2019-07-03 LAB — CBC
HCT: 27.9 % — ABNORMAL LOW (ref 36.0–46.0)
Hemoglobin: 8.8 g/dL — ABNORMAL LOW (ref 12.0–15.0)
MCH: 24 pg — ABNORMAL LOW (ref 26.0–34.0)
MCHC: 31.5 g/dL (ref 30.0–36.0)
MCV: 76.2 fL — ABNORMAL LOW (ref 80.0–100.0)
Platelets: 295 10*3/uL (ref 150–400)
RBC: 3.66 MIL/uL — ABNORMAL LOW (ref 3.87–5.11)
RDW: 16.2 % — ABNORMAL HIGH (ref 11.5–15.5)
WBC: 6.6 10*3/uL (ref 4.0–10.5)
nRBC: 0 % (ref 0.0–0.2)

## 2019-07-03 LAB — GLUCOSE, CAPILLARY
Glucose-Capillary: 102 mg/dL — ABNORMAL HIGH (ref 70–99)
Glucose-Capillary: 116 mg/dL — ABNORMAL HIGH (ref 70–99)
Glucose-Capillary: 148 mg/dL — ABNORMAL HIGH (ref 70–99)
Glucose-Capillary: 163 mg/dL — ABNORMAL HIGH (ref 70–99)
Glucose-Capillary: 174 mg/dL — ABNORMAL HIGH (ref 70–99)
Glucose-Capillary: 223 mg/dL — ABNORMAL HIGH (ref 70–99)

## 2019-07-03 LAB — PROTIME-INR
INR: 2.8 — ABNORMAL HIGH (ref 0.8–1.2)
INR: 2.8 — ABNORMAL HIGH (ref 0.8–1.2)
Prothrombin Time: 29.3 seconds — ABNORMAL HIGH (ref 11.4–15.2)
Prothrombin Time: 29.7 seconds — ABNORMAL HIGH (ref 11.4–15.2)

## 2019-07-03 LAB — BASIC METABOLIC PANEL
Anion gap: 13 (ref 5–15)
BUN: 74 mg/dL — ABNORMAL HIGH (ref 8–23)
CO2: 26 mmol/L (ref 22–32)
Calcium: 8.9 mg/dL (ref 8.9–10.3)
Chloride: 96 mmol/L — ABNORMAL LOW (ref 98–111)
Creatinine, Ser: 5.15 mg/dL — ABNORMAL HIGH (ref 0.44–1.00)
GFR calc Af Amer: 10 mL/min — ABNORMAL LOW (ref >60)
GFR calc non Af Amer: 8 mL/min — ABNORMAL LOW (ref >60)
Glucose, Bld: 114 mg/dL — ABNORMAL HIGH (ref 70–99)
Potassium: 4.6 mmol/L (ref 3.5–5.1)
Sodium: 135 mmol/L (ref 135–145)

## 2019-07-03 MED ORDER — FUROSEMIDE 10 MG/ML IJ SOLN
120.0000 mg | Freq: Two times a day (BID) | INTRAVENOUS | Status: DC
  Administered 2019-07-03 – 2019-07-06 (×6): 120 mg via INTRAVENOUS
  Filled 2019-07-03: qty 12
  Filled 2019-07-03 (×3): qty 10
  Filled 2019-07-03 (×2): qty 12
  Filled 2019-07-03: qty 10

## 2019-07-03 MED ORDER — ISOSORBIDE MONONITRATE ER 30 MG PO TB24
30.0000 mg | ORAL_TABLET | Freq: Every day | ORAL | Status: DC
  Administered 2019-07-03 – 2019-07-13 (×9): 30 mg via ORAL
  Filled 2019-07-03 (×11): qty 1

## 2019-07-03 MED ORDER — DARBEPOETIN ALFA 40 MCG/0.4ML IJ SOSY
40.0000 ug | PREFILLED_SYRINGE | Freq: Once | INTRAMUSCULAR | Status: AC
  Administered 2019-07-03: 40 ug via SUBCUTANEOUS
  Filled 2019-07-03: qty 0.4

## 2019-07-03 MED ORDER — METOLAZONE 5 MG PO TABS
5.0000 mg | ORAL_TABLET | Freq: Once | ORAL | Status: AC
  Administered 2019-07-03: 5 mg via ORAL
  Filled 2019-07-03: qty 1

## 2019-07-03 NOTE — Care Management Important Message (Signed)
Important Message  Patient Details  Name: Amber Young MRN: [id number] Date of Birth: 10-18-1955   Medicare Important Message Given:  Yes     Shelda Altes 07/03/2019, 11:47 AM

## 2019-07-03 NOTE — Telephone Encounter (Signed)
done

## 2019-07-03 NOTE — Progress Notes (Signed)
[facility] Progress Note  Name: Amber Young MRN: [id number] DOB: [birth year]  Chief Complaint:  Shortness of breath   Subjective:  She had 1.2 liters UOP over 11/22 on a regimen of lasix 120 mg IV TID.  Spoke with patient and her husband and she understands near the need for dialysis and does want it when indicated   Review of systems:  Denies any shortness of breath  No n/v No chest pain  ---------- Background on consult:  Pt is a 60F with a PMH sig for HTN, HLD, Afib, CHF with EF 25-30%, CKD with baseline Cr around 3 who is now seen in consultation at the request of Dr. Harl Bowie for evaluation and recommendations surrounding AKI on CKD IV. Typically follows with Dr. Posey Pronto.  She was recently admitted 11/5-11/8 with AKI on CKD and CHF exac.  D/c Cr was 4.5 at that time, usually baseline is around 3.5.  Losartan/ Lasix/ metolazone held at d/c.  Presented back to CHF clinic 11/19 with increased LE edema, weights up, and DOE.  She was admitted.  Started on IV Lasix 80 TID.  Has been making about 2L urine daily.  Weights have been slowly going down but are only down about 4lbs.  Metolazone has been held today.    Cr 4.3 on admission, is 4.5 today.  In this setting we are asked to see.  Pt reports her legs feel tight and uncomfortable.  BP is high.  On a nitro gtt now.  She denies CP.  No HA/ blurry vision.      Intake/Output Summary (Last 24 hours) at 07/03/2019 1203 Last data filed at 07/03/2019 0900 Gross per 24 hour  Intake 878.72 ml  Output 850 ml  Net 28.72 ml    Vitals:  Vitals:   07/03/19 0039 07/03/19 0432 07/03/19 0753 07/03/19 0956  BP: (!) 151/70 (!) 157/66 (!) 164/64 (!) 169/71  Pulse: (!) 57 (!) 57 60 60  Resp: 18 14 16 18   Temp: 98.7 F (37.1 C) 98 F (36.7 C) 98.1 F (36.7 C) (!) 97.5 F (36.4 C)  TempSrc:  Oral Oral Oral  SpO2: 100% 100% 100% 99%  Weight:  78.2 kg    Height:         Physical Exam:  General adult female seated in no acute  distress HEENT normocephalic atraumatic extraocular movements intact sclera anicteric Neck supple trachea midline Lungs basilar crackles; normal work of breathing at rest  Heart regular rate and rhythm no rubs or gallops appreciated Abdomen soft nontender nondistended Extremities 2-3+ edema  Psych normal mood and affect Neuro - alert and oriented x 3; follows commands and provides hx  Medications reviewed   Labs:  BMP Latest Ref Rng & Units 07/03/2019 07/02/2019 07/01/2019  Glucose 70 - 99 mg/dL 114(H) 106(H) 63(L)  BUN 8 - 23 mg/dL 74(H) 69(H) 63(H)  Creatinine 0.44 - 1.00 mg/dL 5.15(H) 4.56(H) 4.73(H)  BUN/Creat Ratio 12 - 28 - - -  Sodium 135 - 145 mmol/L 135 137 137  Potassium 3.5 - 5.1 mmol/L 4.6 4.4 3.3(L)  Chloride 98 - 111 mmol/L 96(L) 99 101  CO2 22 - 32 mmol/L 26 25 25   Calcium 8.9 - 10.3 mg/dL 8.9 8.8(L) 8.2(L)     Assessment/Plan:   1.  AKI on CKD IV:  Multiple recent insults; baseline 3.5 in June and now with recent admission for DKA where d/c Cr was 4.6 11/8.  Now with difficulty getting fluid off. Discussed with pt-- she follows  with Dr Posey Pronto and has discussed dialysis before.   - Lasix was increased to Lasix to 120 mg IV TID.  Set lasix as 120 mg IV BID for now with metolazone 5 mg before next dose.  She is near the need for dialysis.  Discussed with her and she understands.  - NPO after midnight in the event access is needed 11/24 - Note that with regard to access placement that patient is on coumadin as below - contacted primary team and will discuss transition off of coumadin to heparin in anticipation of procedure  - change to renal diet   2.  Acute on chronic CHF: EF 25-30%.  Cardiology following.  3.  DM II: recent admission for DKA  4.  Afib: on coumadin   5. HTN - with overload - diurese as above    6. Anemia of CKD - iron stores acceptable.  Start ESA    Claudia Desanctis, MD 07/03/2019 12:03 PM

## 2019-07-03 NOTE — TOC Initial Note (Addendum)
Transition of Care [facility]) - Initial/Assessment Note    Patient Details  Name: Amber Young MRN: [id number] Date of Birth: [birth year]  Transition of Care [facility]) CM/SW Contact:    [name], RN Phone Number: 07/03/2019, 4:39 PM  Clinical Narrative:                 Patient from home with spouse, she is active with [facility] for [place], [place], [place], NCM offered choice , she would like to continue with [facility].  Soc will begin 24 to 48 hrs post dc. [place] notified.  She has 3 n 1 and rolling walker at home.  Expected Discharge Plan: [place] Barriers to Discharge: No Barriers Identified   Patient Goals and CMS Choice Patient states their goals for this hospitalization and ongoing recovery are:: get well CMS Medicare.gov Compare Post Acute Care list provided to:: Patient Choice offered to / list presented to : Patient  Expected Discharge Plan and Services Expected Discharge Plan: [place]   Discharge Planning Services: CM Consult Post Acute Care Choice: [place], Resumption of Svcs/PTA Provider Living arrangements for the past 2 months: Single Family Home Expected Discharge Date: 07/01/19                         HH Arranged: RN, PT, OT [place] Agency: [facility] Date [place]: 07/03/19 Time [place]: 1639 Representative spoke with at [place] Arrangements/Services Living arrangements for the past 2 months: [place] with:: Spouse Patient language and need for interpreter reviewed:: Yes Do you feel safe going back to the place where you live?: Yes      Need for Family Participation in Patient Care: Yes (Comment) Care giver support system in place?: Yes (comment) Current home services: DME(3 n 1 , rolling walker) Criminal Activity/Legal Involvement Pertinent to Current Situation/Hospitalization: No - Comment as needed  Activities of Daily Living Home  Assistive Devices/Equipment: Walker (specify type), CBG Meter, Blood pressure cuff, Grab bars in shower, Scales ADL Screening (condition at time of admission) Patient's cognitive ability adequate to safely complete daily activities?: Yes Is the patient deaf or have difficulty hearing?: No Does the patient have difficulty seeing, even when wearing glasses/contacts?: No Does the patient have difficulty concentrating, remembering, or making decisions?: No Patient able to express need for assistance with ADLs?: Yes Does the patient have difficulty dressing or bathing?: Yes Independently performs ADLs?: No Communication: Needs assistance Is this a change from baseline?: Pre-admission baseline Dressing (OT): Needs assistance Is this a change from baseline?: Pre-admission baseline Grooming: Needs assistance Feeding: Independent Bathing: Needs assistance Is this a change from baseline?: Pre-admission baseline Toileting: Needs assistance Is this a change from baseline?: Pre-admission baseline In/Out Bed: Needs assistance Is this a change from baseline?: Pre-admission baseline Walks in Home: Independent with device (comment) Does the patient have difficulty walking or climbing stairs?: Yes Weakness of Legs: Left Weakness of Arms/Hands: Left  Permission Sought/Granted                  Emotional Assessment Appearance:: Appears stated age Attitude/Demeanor/Rapport: Engaged Affect (typically observed): Appropriate Orientation: : Oriented to  Time, Oriented to Place, Oriented to Situation, Oriented to Self Alcohol / Substance Use: Not Applicable Psych Involvement: No (comment)  Admission diagnosis:  SOB Patient Active Problem List   Diagnosis Date Noted  . Acute on chronic combined systolic and diastolic CHF (  congestive heart failure) ([place]) 06/29/2019  . DKA (diabetic ketoacidoses) ([place]) 06/16/2019  . AKI (acute kidney injury) ([place]) 06/16/2019  . Acute metabolic encephalopathy  123456  . Acute encephalopathy 06/16/2019  . Hemoglobin A1C greater than 9%, indicating poor diabetic control 02/06/2019  . Glaucoma of left eye 01/23/2019  . Diabetic retinopathy of both eyes with macular edema associated with type 2 diabetes mellitus ([place]) 01/23/2019  . Cataract of both eyes 01/23/2019  . Intractable headache 01/23/2019  . Dizziness 01/23/2019  . Acute on chronic heart failure ([place]) 01/23/2019  . Type 2 diabetes mellitus without complication, with long-term current use of insulin ([place]) 12/01/2018  . Left breast mass 12/01/2018  . Edema, peripheral 12/01/2018  . Visual problems 12/01/2018  . Recent weight loss 12/01/2018  . Paroxysmal atrial fibrillation with RVR ([place]) 07/20/2018  . CKD (chronic kidney disease), stage IV ([place]) 09/07/2017  . Hypertensive urgency 09/07/2017  . Essential hypertension   . Unstable angina ([place])   . Coronary artery disease involving native coronary artery of native heart with unstable angina pectoris ([place])   . Chronic anticoagulation 01/06/2017  . Breast abscess 12/20/2016  . Encounter for therapeutic drug monitoring 05/29/2016  . CAD S/P PCI mLAD DES (Promus 3.0 x 24 -- 3.25 mm) 05/19/2016  . PAF (paroxysmal atrial fibrillation) ([place]) 05/19/2016  . Cardiomyopathy, ischemic     Class: Diagnosis of  . Microcytic anemia 04/26/2016  . Acute on chronic combined systolic (congestive) and diastolic (congestive) heart failure ([place]) 11/14/2015  . Hypokalemia 11/14/2015  . Hypertensive emergency   . Uncontrolled hypertension 09/30/2014  . Uncontrolled diabetes mellitus ([place]) 09/30/2014  . History of stroke 09/30/2014   PCP:  Azzie Glatter, FNP Pharmacy:   [facility], [address] [place] [address] Phone: [phone] Fax: [place], [place] [address] [place] [address] Phone: [phone] Fax:  [phone]     Social Determinants of Health (SDOH) Interventions    Readmission Risk Interventions Readmission Risk Prevention Plan 07/03/2019  Transportation Screening Complete  Medication Review ([place]) Complete  PCP or Specialist appointment within 3-5 days of discharge Complete  HRI or [place] Complete  SW Recovery Care/Counseling Consult Complete  [place] Not Applicable  Some recent data might be hidden

## 2019-07-03 NOTE — Progress Notes (Addendum)
Progress Note  Patient Name: Amber Young Date of Encounter: 07/03/2019  Primary Cardiologist: Skeet Latch, MD   Subjective   Denies any chest pain or SOB.  She put out 1.15L yesterday and is net neg 2.27L.    Inpatient Medications    Scheduled Meds: . amLODipine  10 mg Oral Daily  . atorvastatin  40 mg Oral q1800  . brimonidine  1 drop Left Eye BID  . carvedilol  25 mg Oral BID  . clopidogrel  75 mg Oral Daily  . dorzolamide-timolol  1 drop Left Eye BID  . ferrous sulfate  325 mg Oral QODAY  . hydrALAZINE  100 mg Oral TID  . insulin aspart  0-9 Units Subcutaneous Q4H  . ketorolac  1 drop Right Eye QID  . ofloxacin  1 drop Right Eye TID  . Warfarin - Pharmacist Dosing Inpatient   Does not apply q1800   Continuous Infusions: . sodium chloride 250 mL (07/02/19 1742)  . furosemide Stopped (07/02/19 2316)  . nitroGLYCERIN 15 mcg/min (07/01/19 2315)   PRN Meds: sodium chloride, acetaminophen, ALPRAZolam, nitroGLYCERIN, ondansetron (ZOFRAN) IV, zolpidem   Vital Signs    Vitals:   07/02/19 2022 07/03/19 0039 07/03/19 0432 07/03/19 0753  BP: (!) 151/71 (!) 151/70 (!) 157/66 (!) 164/64  Pulse: (!) 58 (!) 57 (!) 57 60  Resp: 16 18 14 16   Temp: 98 F (36.7 C) 98.7 F (37.1 C) 98 F (36.7 C) 98.1 F (36.7 C)  TempSrc: Oral  Oral Oral  SpO2: 100% 100% 100% 100%  Weight:   78.2 kg   Height:        Intake/Output Summary (Last 24 hours) at 07/03/2019 0946 Last data filed at 07/03/2019 0900 Gross per 24 hour  Intake 896.72 ml  Output 850 ml  Net 46.72 ml   Last 3 Weights 07/03/2019 07/02/2019 07/01/2019  Weight (lbs) 172 lb 8 oz 174 lb 175 lb 4.8 oz  Weight (kg) 78.245 kg 78.926 kg 79.516 kg      Telemetry    NSR- Personally Reviewed  ECG    No new EKG to review- Personally Reviewed  Physical Exam   GEN: Well nourished, well developed in no acute distress HEENT: Normal NECK: No JVD; No carotid bruits LYMPHATICS: No lymphadenopathy  CARDIAC:RRR, no murmurs, rubs, gallops RESPIRATORY:  Clear to auscultation without rales, wheezing or rhonchi  ABDOMEN: Soft, non-tender, non-distended MUSCULOSKELETAL:  3+ LE  edema; No deformity  SKIN: Warm and dry NEUROLOGIC:  Alert and oriented x 3 PSYCHIATRIC:  Normal affect    Labs    High Sensitivity Troponin:   Recent Labs  Lab 06/15/19 0009 06/15/19 1748 06/16/19 0323 06/16/19 0550 06/16/19 0926  TROPONINIHS 76* 70* 86* 91* 96*      Chemistry Recent Labs  Lab 06/29/19 1648  07/01/19 0350 07/02/19 0358 07/03/19 0509  NA 135   < > 137 137 135  K 4.2   < > 3.3* 4.4 4.6  CL 103   < > 101 99 96*  CO2 23   < > 25 25 26   GLUCOSE 197*   < > 63* 106* 114*  BUN 59*   < > 63* 69* 74*  CREATININE 4.32*   < > 4.73* 4.56* 5.15*  CALCIUM 8.6*   < > 8.2* 8.8* 8.9  PROT 6.3*  --   --   --   --   ALBUMIN 2.7*  --   --   --   --   AST  20  --   --   --   --   ALT 31  --   --   --   --   ALKPHOS 104  --   --   --   --   BILITOT 0.6  --   --   --   --   GFRNONAA 10*   < > 9* 10* 8*  GFRAA 12*   < > 11* 11* 10*  ANIONGAP 9   < > 11 13 13    < > = values in this interval not displayed.     Hematology Recent Labs  Lab 07/01/19 0350 07/02/19 0358 07/03/19 0509  WBC 4.9 5.6 6.6  RBC 3.51* 3.79* 3.66*  HGB 8.6* 9.2* 8.8*  HCT 26.2* 28.8* 27.9*  MCV 74.6* 76.0* 76.2*  MCH 24.5* 24.3* 24.0*  MCHC 32.8 31.9 31.5  RDW 16.0* 16.3* 16.2*  PLT 271 319 295    BNP Recent Labs  Lab 06/29/19 2211  BNP 2,340.4*     DDimer No results for input(s): DDIMER in the last 168 hours.   Radiology    No results found.  Cardiac Studies   2D echo 07/01/2019 IMPRESSIONS   1. Left ventricular ejection fraction, by visual estimation, is 35 to 40%. The left ventricle has moderately decreased function. There is mildly increased left ventricular hypertrophy.  2. Left ventricular diastolic parameters are consistent with Grade II diastolic dysfunction (pseudonormalization).  3.  Mildly dilated left ventricular internal cavity size.  4. Diffuse hypokinesis worse in septum.  5. Global right ventricle has normal systolic function.The right ventricular size is normal. No increase in right ventricular wall thickness.  6. Left atrial size was moderately dilated.  7. Right atrial size was normal.  8. The mitral valve is normal in structure. Mild mitral valve regurgitation.  9. The tricuspid valve is normal in structure. Tricuspid valve regurgitation mild-moderate. 10. The aortic valve is tricuspid. Aortic valve regurgitation is not visualized. Mild aortic valve sclerosis without stenosis. 11. The pulmonic valve was grossly normal. Pulmonic valve regurgitation is mild. 12. Moderately elevated pulmonary artery systolic pressure.  Patient Profile     63 y.o.femalewith a history of HTN, DM, HLD, CVA, PAF on coumadin, GERD,anemia, DES x2 RCA 1/29/2019w/ med rx for 40% LAD, 2017 LAD stent ok, EF 40-45% by echo 08/2017 w/ grade 3 dd, S-D-CHF, CKD IV (Dr Posey Pronto), hx med noncompliance at [facility] admitted with acute CHF exacerbation  Assessment & Plan    1. Acute on chronic systolic HF - [id number] echo LVEF 25-30%, grade II diastolic dysfunction, mild RV dysfunction, mod TR - fairly benign CXR but BNP 2300 -  She is on IV lasix 80mg  tid and Metolazone PRN - she put out 1.15L and is net neg 2.27L.  - weight down 6lbs from admit - creatinine 5.15 today up from 4.56 yesterday -she still has marked LE edema on exam - LVEF dropped from 07/2018 (40-45%) to 25-30% by 01/2019 echo. Did not have repeat ischemic testing at that time due to her renal function - repeat echo this admit LVEF 35-40%, grade II diastolic dysfunction, normal RV function. I don't see a major change in her cardiac function as the etiology for her worsening renal failure and difficultly diuresising - current medical therapy with coreg 25mg  bid, hydral 100mg  tid, nitro gtt. Medical therapy limited by severe renal  dysfunction - appreciate nephrology consult.  Lasix increased to 120mg  IV TID and if no improvement in diuresis then plan to  start HD - needs aggressive BP control as well.  - add compression hose  2. AKI on CKD IV - cr in June 3.52 more recently in the 4.53 range - increased today to 5.15 - renal following - diuretics increased - discussed HD  3. ASCAD - s/p overlapping DES 08/2017 and hx of stent to mLAD was patent on cath 08/2017 otherwise non obstructive disease. - denies any anginal sx  4. Anemia -  Hgb on admit 10.2 and now 8.8 plts 285 --in 06/18/19 hgb was 9.5 (iron 01/2019 was 19 and iron sat 7 with hgb 11.2)  - stable and likely related to chronic dz  5. PAF - maintaining NSR - INR 2.8 today on coumadin  6.  HTN - BP remains poorly controlled - continue amlodipine 10mg  daily, Carvedilol 25mg  BID, Hydralazine 100mg  TID - stop NTG gtt and start Imdur 30mg  daily and titrate for BP control - if she starts HD then could add on ARB  I have spent a total of 35 minutes with patient reviewing notes , telemetry, EKGs, labs and examining patient as well as establishing an assessment and plan that was discussed with the patient.  > 50% of time was spent in direct patient care.    For questions or updates, please contact [facility] Please consult [email or web address] for contact info under        Signed, Fransico Him, MD  07/03/2019, 9:46 AM

## 2019-07-04 LAB — BASIC METABOLIC PANEL
Anion gap: 12 (ref 5–15)
BUN: 83 mg/dL — ABNORMAL HIGH (ref 8–23)
CO2: 28 mmol/L (ref 22–32)
Calcium: 9.2 mg/dL (ref 8.9–10.3)
Chloride: 96 mmol/L — ABNORMAL LOW (ref 98–111)
Creatinine, Ser: 5.74 mg/dL — ABNORMAL HIGH (ref 0.44–1.00)
GFR calc Af Amer: 8 mL/min — ABNORMAL LOW (ref >60)
GFR calc non Af Amer: 7 mL/min — ABNORMAL LOW (ref >60)
Glucose, Bld: 102 mg/dL — ABNORMAL HIGH (ref 70–99)
Potassium: 4.6 mmol/L (ref 3.5–5.1)
Sodium: 136 mmol/L (ref 135–145)

## 2019-07-04 LAB — CBC
HCT: 27.2 % — ABNORMAL LOW (ref 36.0–46.0)
Hemoglobin: 8.7 g/dL — ABNORMAL LOW (ref 12.0–15.0)
MCH: 24.4 pg — ABNORMAL LOW (ref 26.0–34.0)
MCHC: 32 g/dL (ref 30.0–36.0)
MCV: 76.2 fL — ABNORMAL LOW (ref 80.0–100.0)
Platelets: 304 10*3/uL (ref 150–400)
RBC: 3.57 MIL/uL — ABNORMAL LOW (ref 3.87–5.11)
RDW: 16.3 % — ABNORMAL HIGH (ref 11.5–15.5)
WBC: 6 10*3/uL (ref 4.0–10.5)
nRBC: 0 % (ref 0.0–0.2)

## 2019-07-04 LAB — GLUCOSE, CAPILLARY
Glucose-Capillary: 101 mg/dL — ABNORMAL HIGH (ref 70–99)
Glucose-Capillary: 110 mg/dL — ABNORMAL HIGH (ref 70–99)
Glucose-Capillary: 153 mg/dL — ABNORMAL HIGH (ref 70–99)
Glucose-Capillary: 178 mg/dL — ABNORMAL HIGH (ref 70–99)
Glucose-Capillary: 191 mg/dL — ABNORMAL HIGH (ref 70–99)
Glucose-Capillary: 199 mg/dL — ABNORMAL HIGH (ref 70–99)

## 2019-07-04 LAB — PROTIME-INR
INR: 2.8 — ABNORMAL HIGH (ref 0.8–1.2)
INR: 2.5 — ABNORMAL HIGH (ref 0.8–1.2)
Prothrombin Time: 29.4 seconds — ABNORMAL HIGH (ref 11.4–15.2)
Prothrombin Time: 26.7 seconds — ABNORMAL HIGH (ref 11.4–15.2)

## 2019-07-04 MED ORDER — METOLAZONE 5 MG PO TABS
5.0000 mg | ORAL_TABLET | Freq: Once | ORAL | Status: AC
  Administered 2019-07-04: 5 mg via ORAL
  Filled 2019-07-04: qty 1

## 2019-07-04 NOTE — Progress Notes (Signed)
[facility] Progress Note  Name: Amber Young MRN: [id number] DOB: 1955-12-29  Chief Complaint:  Shortness of breath   Subjective:  She had 1.1 liters UOP over 11/23 on a regimen of lasix 120 mg IV BID and metolazone.  Feels ok this morning.   Review of systems:   Denies any shortness of breath  No n/v No chest pain  ---------- Background on consult:  Pt is a 40F with a PMH sig for HTN, HLD, Afib, CHF with EF 25-30%, CKD with baseline Cr around 3 who is now seen in consultation at the request of Dr. Harl Bowie for evaluation and recommendations surrounding AKI on CKD IV. Typically follows with Dr. Posey Pronto.  She was recently admitted 11/5-11/8 with AKI on CKD and CHF exac.  D/c Cr was 4.5 at that time, usually baseline is around 3.5.  Losartan/ Lasix/ metolazone held at d/c.  Presented back to CHF clinic 11/19 with increased LE edema, weights up, and DOE.  She was admitted.  Started on IV Lasix 80 TID.  Has been making about 2L urine daily.  Weights have been slowly going down but are only down about 4lbs.  Metolazone has been held today.    Cr 4.3 on admission, is 4.5 today.  In this setting we are asked to see.  Pt reports her legs feel tight and uncomfortable.  BP is high.  On a nitro gtt now.  She denies CP.  No HA/ blurry vision.      Intake/Output Summary (Last 24 hours) at 07/04/2019 0845 Last data filed at 07/04/2019 0810 Gross per 24 hour  Intake 717.81 ml  Output 1050 ml  Net -332.19 ml    Vitals:  Vitals:   07/03/19 1524 07/03/19 1556 07/03/19 1949 07/04/19 0642  BP: (!) 150/70 (!) 152/68 (!) 145/65 (!) 167/61  Pulse: 61 60 61 61  Resp:  18 16 18   Temp:  98 F (36.7 C) 98.2 F (36.8 C) 98.6 F (37 C)  TempSrc:  Oral Oral Oral  SpO2: 100% 100% 100% 98%  Weight:      Height:         Physical Exam:  General adult female seated in no acute distress  HEENT normocephalic atraumatic extraocular movements intact sclera anicteric Neck supple trachea  midline Lungs basilar crackles; normal work of breathing at rest  Heart regular rate and rhythm no rubs or gallops appreciated Abdomen soft nontender nondistended Extremities 2+ edema  Psych normal mood and affect Neuro - alert and oriented x 3; follows commands and provides hx  Medications reviewed   Labs:  BMP Latest Ref Rng & Units 07/04/2019 07/03/2019 07/02/2019  Glucose 70 - 99 mg/dL 102(H) 114(H) 106(H)  BUN 8 - 23 mg/dL 83(H) 74(H) 69(H)  Creatinine 0.44 - 1.00 mg/dL 5.74(H) 5.15(H) 4.56(H)  BUN/Creat Ratio 12 - 28 - - -  Sodium 135 - 145 mmol/L 136 135 137  Potassium 3.5 - 5.1 mmol/L 4.6 4.6 4.4  Chloride 98 - 111 mmol/L 96(L) 96(L) 99  CO2 22 - 32 mmol/L 28 26 25   Calcium 8.9 - 10.3 mg/dL 9.2 8.9 8.8(L)     Assessment/Plan:   1.  AKI on CKD IV:  Multiple recent insults; baseline 3.5 in June and now with recent admission for DKA where d/c Cr was 4.6 11/8.  Now with difficulty getting fluid off. Discussed with pt-- she follows with Dr Posey Pronto and has discussed dialysis before.   - lasix as 120 mg IV BID for now  with metolazone 5 mg before next dose.  She is near the need for dialysis.  Discussed with her and she understands.  - She is on heparin gtt in anticipation of need for dialysis access   - NPO after midnight - renal diabetic diet for now  - Anticipate HD on 11/25 after access placement   2.  Acute on chronic CHF: EF 25-30%.  Cardiology following.  3.  DM II: recent admission for DKA  4.  Afib: on heparin gtt in anticipation of dialysis access   5. HTN - with overload - diurese as above    6. Anemia of CKD - iron stores acceptable.  aranesp 40 mcg on 11/23   Claudia Desanctis, MD 07/04/2019 8:45 AM

## 2019-07-04 NOTE — Progress Notes (Signed)
Progress Note  Patient Name: Amber Young Date of Encounter: 07/04/2019  Primary Cardiologist: Skeet Latch, MD   Subjective   Feeling okay this morning. Breathing is stable. She is wearing her compression stockings. No complaints of chest pain or palpitations. She is nervous about the upcoming HD access placement tomorrow. No new issues/complaints.   Inpatient Medications    Scheduled Meds: . amLODipine  10 mg Oral Daily  . atorvastatin  40 mg Oral q1800  . brimonidine  1 drop Left Eye BID  . carvedilol  25 mg Oral BID  . clopidogrel  75 mg Oral Daily  . dorzolamide-timolol  1 drop Left Eye BID  . ferrous sulfate  325 mg Oral QODAY  . hydrALAZINE  100 mg Oral TID  . insulin aspart  0-9 Units Subcutaneous Q4H  . isosorbide mononitrate  30 mg Oral Daily  . ketorolac  1 drop Right Eye QID  . metolazone  5 mg Oral Once  . ofloxacin  1 drop Right Eye TID   Continuous Infusions: . sodium chloride 250 mL (07/03/19 1720)  . furosemide 120 mg (07/03/19 1721)   PRN Meds: sodium chloride, acetaminophen, ALPRAZolam, nitroGLYCERIN, ondansetron (ZOFRAN) IV, zolpidem   Vital Signs    Vitals:   07/03/19 1524 07/03/19 1556 07/03/19 1949 07/04/19 0642  BP: (!) 150/70 (!) 152/68 (!) 145/65 (!) 167/61  Pulse: 61 60 61 61  Resp:  18 16 18   Temp:  98 F (36.7 C) 98.2 F (36.8 C) 98.6 F (37 C)  TempSrc:  Oral Oral Oral  SpO2: 100% 100% 100% 98%  Weight:      Height:        Intake/Output Summary (Last 24 hours) at 07/04/2019 1006 Last data filed at 07/04/2019 0810 Gross per 24 hour  Intake 477.81 ml  Output 1050 ml  Net -572.19 ml   Filed Weights   07/01/19 0655 07/02/19 0347 07/03/19 0432  Weight: 79.5 kg 78.9 kg 78.2 kg    Telemetry    NSR - Personally Reviewed   Physical Exam   GEN: Sitting upright in bedside chair in no acute distress.   Neck: No JVD, no carotid bruits Cardiac: RRR, no murmurs, rubs, or gallops.  Respiratory: crackles at lung bases,  otherwise no wheezes/ rhonchi GI: NABS, Soft, nontender, non-distended  MS: 2-3+ LE edema; No deformity. Neuro:  Nonfocal, moving all extremities spontaneously Psych: Normal affect   Labs    Chemistry Recent Labs  Lab 06/29/19 1648  07/02/19 0358 07/03/19 0509 07/04/19 0426  NA 135   < > 137 135 136  K 4.2   < > 4.4 4.6 4.6  CL 103   < > 99 96* 96*  CO2 23   < > 25 26 28   GLUCOSE 197*   < > 106* 114* 102*  BUN 59*   < > 69* 74* 83*  CREATININE 4.32*   < > 4.56* 5.15* 5.74*  CALCIUM 8.6*   < > 8.8* 8.9 9.2  PROT 6.3*  --   --   --   --   ALBUMIN 2.7*  --   --   --   --   AST 20  --   --   --   --   ALT 31  --   --   --   --   ALKPHOS 104  --   --   --   --   BILITOT 0.6  --   --   --   --  GFRNONAA 10*   < > 10* 8* 7*  GFRAA 12*   < > 11* 10* 8*  ANIONGAP 9   < > 13 13 12    < > = values in this interval not displayed.     Hematology Recent Labs  Lab 07/02/19 0358 07/03/19 0509 07/04/19 0426  WBC 5.6 6.6 6.0  RBC 3.79* 3.66* 3.57*  HGB 9.2* 8.8* 8.7*  HCT 28.8* 27.9* 27.2*  MCV 76.0* 76.2* 76.2*  MCH 24.3* 24.0* 24.4*  MCHC 31.9 31.5 32.0  RDW 16.3* 16.2* 16.3*  PLT 319 295 304    Cardiac EnzymesNo results for input(s): TROPONINI in the last 168 hours. No results for input(s): TROPIPOC in the last 168 hours.   BNP Recent Labs  Lab 06/29/19 2211  BNP 2,340.4*     DDimer No results for input(s): DDIMER in the last 168 hours.   Radiology    No results found.  Cardiac Studies   2D echo 07/01/2019 IMPRESSIONS  1. Left ventricular ejection fraction, by visual estimation, is 35 to 40%. The left ventricle has moderately decreased function. There is mildly increased left ventricular hypertrophy. 2. Left ventricular diastolic parameters are consistent with Grade II diastolic dysfunction (pseudonormalization). 3. Mildly dilated left ventricular internal cavity size. 4. Diffuse hypokinesis worse in septum. 5. Global right ventricle has normal  systolic function.The right ventricular size is normal. No increase in right ventricular wall thickness. 6. Left atrial size was moderately dilated. 7. Right atrial size was normal. 8. The mitral valve is normal in structure. Mild mitral valve regurgitation. 9. The tricuspid valve is normal in structure. Tricuspid valve regurgitation mild-moderate. 10. The aortic valve is tricuspid. Aortic valve regurgitation is not visualized. Mild aortic valve sclerosis without stenosis. 11. The pulmonic valve was grossly normal. Pulmonic valve regurgitation is mild. 12. Moderately elevated pulmonary artery systolic pressure.  Patient Profile     63 y.o.femalewith a history of HTN, DM, HLD, CVA, PAF on coumadin, GERD,anemia, DES x2 RCA 1/29/2019w/ med rx for 40% LAD, 2017 LAD stent ok, EF 40-45% by echo 08/2017 w/ grade 3 dd, S-D-CHF, CKD IV (Dr Posey Pronto), hx med [facility]compliance at [facility] admitted with acute CHF exacerbation  Assessment & Plan    1. Acute on chronic combined CHF: echo this admission with EF 35-40%, improved from 25-30% 01/2019, with G2DD, and normal RV function. Worsening renal function has complicated management. Nephrology following to assist with diuresis. On IV lasix 120mg  BID with metolazone 5mg  yesterday. UOP net -332.2 mL in the past 24 hours with -2.8L this admission. Felt to be progressing towards need for dialysis with plans to establish access site tomorrow.  - Appreciate nephrology's recommendations - NPO after MN for HD access placement 07/05/2019 with plans to start HD afterwards - Continue lasix 120mg  IV BID with metolazone 5mg  prior to next dose - Unable to add ACEi/ARB/ARNI due to renal dysfunction - Continue to monitor strict I&Os and daily weights  2. Acute on CKD stage 4: Cr declining with attempts to diurese; up to 5.74 today. Nephrology following with plans to establish access site tomorrow and initiate HD 07/05/2019.  - Continue management per nephrology  3. CAD  s/p PCI/DES to LAD 08/2017: no anginal complaints. Not on aspirin given need for anticoagulation - Continue statin and BBlocker  4. Paroxysmal atrial fibrillation: maintaining sinus rhythm. Coumadin held in anticipation of HD access placement. Now on heparin gtt - Continue heparin gtt - anticipate resuming coumadin post HD access placement - Continue BBlocker for  rate control  5. HTN: BP poorly controlled despite max dose of amlodipine, carvedilol, and hydralazine. Started on imdur 30mg  daily.  - Continue current medications - Could consider addition of ARB after starting of HD  6. Anemia: Hgb 8.7 today, overall stable but down from 10. Likely anemia of chronic disease - Continue to monitor  For questions or updates, please contact [place] Please consult [email or web address] for contact info under Cardiology/STEMI.      Signed, Abigail Butts, PA-C  07/04/2019, 10:06 AM   [phone]

## 2019-07-04 NOTE — Progress Notes (Signed)
ANTICOAGULATION CONSULT NOTE  Pharmacy Consult:  Heparin / Coumadin Indication: atrial fibrillation (CHADS2VASc = 7),  Patient Measurements: Height: 5\' 7"  (170.2 cm) Weight: 172 lb 8 oz (78.2 kg)(scale b) IBW/kg (Calculated) : 61.6 Heparin Dosing Weight: 76.8 kg   Vital Signs: Temp: 98.3 F (36.8 C) (11/24 1104) Temp Source: Oral (11/24 1104) BP: 151/59 (11/24 1104) Pulse Rate: 64 (11/24 1104)  Labs: Recent Labs    07/02/19 0358 07/03/19 0509 07/03/19 1758 07/04/19 0426 07/04/19 1745  HGB 9.2* 8.8*  --  8.7*  --   HCT 28.8* 27.9*  --  27.2*  --   PLT 319 295  --  304  --   LABPROT 22.8* 29.3* 29.7* 29.4* 26.7*  INR 2.0* 2.8* 2.8* 2.8* 2.5*  HEPARINUNFRC 0.56  --   --   --   --   CREATININE 4.56* 5.15*  --  5.74*  --     Estimated Creatinine Clearance: 10.8 mL/min (A) (by C-G formula based on SCr of 5.74 mg/dL (H)).   Assessment: 63 yo female presented on 06/29/2019 with SOB and CHF exacerbation. Patient has a history of Afib and on Coumadin PTA. Pharmacy originally consulted bridge IV heparin to Coumadin given sub-therapeutic INR on admit. INR became therapeutic on 11/22 but patient needs an HD line placed so new consult for holding warfarin and restarting heparin infusion when INR is  < 2.5 -INR= 2.5    Home Coumadin regimen: 5mg  TuThSS, 7.5mg  on MWF  Goal of Therapy:  INR 2-3 Monitor platelets by anticoagulation protocol: Yes   Plan:  Start heparin at 1000 units/ hr, no bolus, when INR < 2.5  Monitor twice daily PT / INR and daily CBC   Hildred Laser, PharmD Clinical Pharmacist **Pharmacist phone directory can now be found on [email or web address] ([email or web address]).  Listed under [place].

## 2019-07-04 NOTE — Progress Notes (Signed)
ANTICOAGULATION CONSULT NOTE  Pharmacy Consult:  Heparin / Coumadin Indication: atrial fibrillation (CHADS2VASc = 7),  Patient Measurements: Height: 5\' 7"  (170.2 cm) Weight: 172 lb 8 oz (78.2 kg)(scale b) IBW/kg (Calculated) : 61.6 Heparin Dosing Weight: 76.8 kg   Vital Signs: Temp: 98.6 F (37 C) (11/24 0642) Temp Source: Oral (11/24 0642) BP: 167/61 (11/24 0642) Pulse Rate: 61 (11/24 0642)  Labs: Recent Labs    07/02/19 0358 07/03/19 0509 07/03/19 1758 07/04/19 0426  HGB 9.2* 8.8*  --  8.7*  HCT 28.8* 27.9*  --  27.2*  PLT 319 295  --  304  LABPROT 22.8* 29.3* 29.7* 29.4*  INR 2.0* 2.8* 2.8* 2.8*  HEPARINUNFRC 0.56  --   --   --   CREATININE 4.56* 5.15*  --  5.74*    Estimated Creatinine Clearance: 10.8 mL/min (A) (by C-G formula based on SCr of 5.74 mg/dL (H)).   Assessment: 63 yo female presented on 06/29/2019 with SOB and CHF exacerbation. Patient has a history of Afib and on Coumadin PTA. Pharmacy originally consulted bridge IV heparin to Coumadin given sub-therapeutic INR on admit. INR became therapeutic on 11/22 but patient needs an HD line placed so new consult for holding warfarin and restarting heparin infusion when INR is  < 2.5   INR has been stable at 2.8 for 24 hr since stopping warfarin. Per MD, continue to check BID INRs to determine heparin start time.   Home Coumadin regimen: 5mg  TuThSS, 7.5mg  on MWF  Goal of Therapy:  INR 2-3 Monitor platelets by anticoagulation protocol: Yes   Plan:  Start heparin at 1000 units/ hr, no bolus, when INR < 2.5  Monitor twice daily PT / INR and daily CBC Monitor for signs/symptoms of bleeding    Benetta Spar, PharmD, BCPS, BCCP Clinical Pharmacist  Please check AMION for all [place] phone numbers After 10:00 PM, call [place]

## 2019-07-04 NOTE — Progress Notes (Signed)
Inpatient Diabetes Program Recommendations  AACE/ADA: New Consensus Statement on Inpatient Glycemic Control (2015)  Target Ranges:  Prepandial:   less than 140 mg/dL      Peak postprandial:   less than 180 mg/dL (1-2 hours)      Critically ill patients:  140 - 180 mg/dL   Results for ELAH, GALLMAN (MRN [id number]) as of 07/04/2019 10:20  Ref. Range 07/04/2019 00:08 07/04/2019 04:18 07/04/2019 07:47  Glucose-Capillary Latest Ref Range: 70 - 99 mg/dL 199 (H) 110 (H) 101 (H)    Admit with: SOB/ CHF Exacerbation  History: DM, CVA, CKD, CHF  Home DM Meds: Glipizide 10 mg BID                             Lantus 4 units Daily  Current Orders: Novolog Sensitive Correction Scale/ SSI (0-9 units) Q4 hours       Was discharged on 11/08 on Glipizide and Lantus (Amaryl and Metformin stopped).  Note patient will get HD access and will likely start on Dialysis tomorrow.     MD----Glipizide may not be the best choice for this patient at home given her elevated creatinine--May consider stopping Glipizide at home and possibly starting Tradjenta 5 mg Daily for home.  Tradjenta does not have to be renally dosed and is cleared by the gut--Has lower risk for Hypoglycemia at home.  Not sure if patient will need Lantus at time of discharge.  Has not had any Lantus since 11/20 and does not appear to need basal insulin at this point.    --Will follow patient during hospitalization--  Wyn Quaker RN, MSN, CDE Diabetes Coordinator Inpatient Glycemic Control Team Team Pager: [phone] (8a-5p)

## 2019-07-05 ENCOUNTER — Inpatient Hospital Stay (HOSPITAL_COMMUNITY): Payer: Medicare Other

## 2019-07-05 ENCOUNTER — Encounter (HOSPITAL_COMMUNITY): Payer: Self-pay | Admitting: Interventional Radiology

## 2019-07-05 DIAGNOSIS — Z992 Dependence on renal dialysis: Secondary | ICD-10-CM

## 2019-07-05 DIAGNOSIS — N185 Chronic kidney disease, stage 5: Secondary | ICD-10-CM

## 2019-07-05 DIAGNOSIS — N186 End stage renal disease: Secondary | ICD-10-CM

## 2019-07-05 HISTORY — PX: IR FLUORO GUIDE CV LINE RIGHT: IMG2283

## 2019-07-05 HISTORY — PX: IR US GUIDE VASC ACCESS RIGHT: IMG2390

## 2019-07-05 LAB — GLUCOSE, CAPILLARY
Glucose-Capillary: 108 mg/dL — ABNORMAL HIGH (ref 70–99)
Glucose-Capillary: 128 mg/dL — ABNORMAL HIGH (ref 70–99)
Glucose-Capillary: 162 mg/dL — ABNORMAL HIGH (ref 70–99)
Glucose-Capillary: 168 mg/dL — ABNORMAL HIGH (ref 70–99)
Glucose-Capillary: 193 mg/dL — ABNORMAL HIGH (ref 70–99)

## 2019-07-05 LAB — RENAL FUNCTION PANEL
Albumin: 2.9 g/dL — ABNORMAL LOW (ref 3.5–5.0)
Anion gap: 14 (ref 5–15)
BUN: 91 mg/dL — ABNORMAL HIGH (ref 8–23)
CO2: 25 mmol/L (ref 22–32)
Calcium: 8.9 mg/dL (ref 8.9–10.3)
Chloride: 93 mmol/L — ABNORMAL LOW (ref 98–111)
Creatinine, Ser: 6.06 mg/dL — ABNORMAL HIGH (ref 0.44–1.00)
GFR calc Af Amer: 8 mL/min — ABNORMAL LOW (ref >60)
GFR calc non Af Amer: 7 mL/min — ABNORMAL LOW (ref >60)
Glucose, Bld: 109 mg/dL — ABNORMAL HIGH (ref 70–99)
Phosphorus: 4.5 mg/dL (ref 2.5–4.6)
Potassium: 3.6 mmol/L (ref 3.5–5.1)
Sodium: 132 mmol/L — ABNORMAL LOW (ref 135–145)

## 2019-07-05 LAB — CBC
HCT: 25.5 % — ABNORMAL LOW (ref 36.0–46.0)
Hemoglobin: 8.2 g/dL — ABNORMAL LOW (ref 12.0–15.0)
MCH: 24.2 pg — ABNORMAL LOW (ref 26.0–34.0)
MCHC: 32.2 g/dL (ref 30.0–36.0)
MCV: 75.2 fL — ABNORMAL LOW (ref 80.0–100.0)
Platelets: 282 10*3/uL (ref 150–400)
RBC: 3.39 MIL/uL — ABNORMAL LOW (ref 3.87–5.11)
RDW: 16.3 % — ABNORMAL HIGH (ref 11.5–15.5)
WBC: 6.8 10*3/uL (ref 4.0–10.5)
nRBC: 0 % (ref 0.0–0.2)

## 2019-07-05 LAB — PROTIME-INR
INR: 2.4 — ABNORMAL HIGH (ref 0.8–1.2)
INR: 1.8 — ABNORMAL HIGH (ref 0.8–1.2)
Prothrombin Time: 26 seconds — ABNORMAL HIGH (ref 11.4–15.2)
Prothrombin Time: 20.5 seconds — ABNORMAL HIGH (ref 11.4–15.2)

## 2019-07-05 LAB — HEPATITIS B CORE ANTIBODY, TOTAL: Hep B Core Total Ab: NONREACTIVE

## 2019-07-05 LAB — TYPE AND SCREEN
ABO/RH(D): A POS
Antibody Screen: NEGATIVE

## 2019-07-05 LAB — HEPATITIS B SURFACE ANTIBODY,QUALITATIVE: Hep B S Ab: NONREACTIVE

## 2019-07-05 LAB — HEPATITIS B SURFACE ANTIGEN: Hepatitis B Surface Ag: NONREACTIVE

## 2019-07-05 LAB — ABO/RH: ABO/RH(D): A POS

## 2019-07-05 MED ORDER — PENTAFLUOROPROP-TETRAFLUOROETH EX AERO: 1.0000 "application " | INHALATION_SPRAY | CUTANEOUS | Status: DC | PRN

## 2019-07-05 MED ORDER — VITAMIN K1 10 MG/ML IJ SOLN
1.0000 mg | Freq: Once | INTRAVENOUS | Status: AC
  Administered 2019-07-05: 1 mg via INTRAVENOUS
  Filled 2019-07-05: qty 0.1

## 2019-07-05 MED ORDER — LIDOCAINE HCL 1 % IJ SOLN
INTRAMUSCULAR | Status: DC | PRN
  Administered 2019-07-05: 5 mL

## 2019-07-05 MED ORDER — LIDOCAINE-PRILOCAINE 2.5-2.5 % EX CREA: 1.0000 "application " | TOPICAL_CREAM | CUTANEOUS | Status: DC | PRN

## 2019-07-05 MED ORDER — HEPARIN (PORCINE) 25000 UT/250ML-% IV SOLN
1100.0000 [IU]/h | INTRAVENOUS | Status: DC
  Administered 2019-07-05: 1100 [IU]/h via INTRAVENOUS
  Filled 2019-07-05: qty 250

## 2019-07-05 MED ORDER — SODIUM CHLORIDE 0.9 % IV SOLN: 100.0000 mL | INTRAVENOUS | Status: DC | PRN

## 2019-07-05 MED ORDER — SODIUM CHLORIDE 0.9% IV SOLUTION
Freq: Once | INTRAVENOUS | Status: AC
  Administered 2019-07-05: 13:00:00 via INTRAVENOUS

## 2019-07-05 MED ORDER — ALTEPLASE 2 MG IJ SOLR: 2.0000 mg | Freq: Once | INTRAMUSCULAR | Status: DC | PRN

## 2019-07-05 MED ORDER — CEFAZOLIN SODIUM-DEXTROSE 2-4 GM/100ML-% IV SOLN
2.0000 g | INTRAVENOUS | Status: AC
  Filled 2019-07-05: qty 100

## 2019-07-05 MED ORDER — METOLAZONE 5 MG PO TABS
10.0000 mg | ORAL_TABLET | Freq: Once | ORAL | Status: AC
  Administered 2019-07-05: 10 mg via ORAL
  Filled 2019-07-05: qty 2

## 2019-07-05 MED ORDER — LIDOCAINE HCL 1 % IJ SOLN
INTRAMUSCULAR | Status: AC
  Filled 2019-07-05: qty 20

## 2019-07-05 MED ORDER — CHLORHEXIDINE GLUCONATE CLOTH 2 % EX PADS: 6.0000 | MEDICATED_PAD | Freq: Every day | CUTANEOUS | Status: DC

## 2019-07-05 MED ORDER — HEPARIN SODIUM (PORCINE) 1000 UNIT/ML IJ SOLN
INTRAMUSCULAR | Status: AC
  Filled 2019-07-05: qty 1

## 2019-07-05 MED ORDER — CHLORHEXIDINE GLUCONATE CLOTH 2 % EX PADS
6.0000 | MEDICATED_PAD | Freq: Every day | CUTANEOUS | Status: DC
  Administered 2019-07-05: 6 via TOPICAL

## 2019-07-05 MED ORDER — HEPARIN SODIUM (PORCINE) 1000 UNIT/ML DIALYSIS
1000.0000 [IU] | INTRAMUSCULAR | Status: DC | PRN
  Administered 2019-07-06: 01:00:00 2600 [IU] via INTRAVENOUS_CENTRAL

## 2019-07-05 MED ORDER — LIDOCAINE HCL (PF) 1 % IJ SOLN: 5.0000 mL | INTRAMUSCULAR | Status: DC | PRN

## 2019-07-05 MED ORDER — HEPARIN SODIUM (PORCINE) 1000 UNIT/ML IJ SOLN
INTRAMUSCULAR | Status: DC | PRN
  Administered 2019-07-05: 2.6 mL via INTRAVENOUS
  Administered 2019-07-07: 10:00:00 2600 [IU] via INTRAVENOUS

## 2019-07-05 MED ORDER — HEPARIN SODIUM (PORCINE) 1000 UNIT/ML DIALYSIS: 1000.0000 [IU] | INTRAMUSCULAR | Status: DC | PRN

## 2019-07-05 MED ORDER — CLOPIDOGREL BISULFATE 75 MG PO TABS
75.0000 mg | ORAL_TABLET | Freq: Every day | ORAL | Status: DC
  Administered 2019-07-06: 75 mg via ORAL
  Filled 2019-07-05: qty 1

## 2019-07-05 NOTE — Progress Notes (Signed)
ANTICOAGULATION CONSULT NOTE  Pharmacy Consult:  Heparin / Coumadin Indication: atrial fibrillation (CHADS2VASc = 7),  Patient Measurements: Height: 5\' 7"  (170.2 cm) Weight: 175 lb 9.6 oz (79.7 kg) IBW/kg (Calculated) : 61.6 Heparin Dosing Weight: 76.8 kg   Vital Signs: Temp: 99 F (37.2 C) (11/25 1130) Temp Source: Oral (11/25 1130) BP: 152/49 (11/25 1130) Pulse Rate: 60 (11/25 1130)  Labs: Recent Labs    07/03/19 0509  07/04/19 0426 07/04/19 1745 07/05/19 0821  HGB 8.8*  --  8.7*  --  8.2*  HCT 27.9*  --  27.2*  --  25.5*  PLT 295  --  304  --  282  LABPROT 29.3*   < > 29.4* 26.7* 26.0*  INR 2.8*   < > 2.8* 2.5* 2.4*  CREATININE 5.15*  --  5.74*  --  6.06*   < > = values in this interval not displayed.    Estimated Creatinine Clearance: 10.3 mL/min (A) (by C-G formula based on SCr of 6.06 mg/dL (H)).   Assessment: 63 yo female presented on 06/29/2019 with SOB and CHF exacerbation. Patient has a history of Afib and on Coumadin PTA. Pharmacy originally consulted bridge IV heparin to Coumadin given sub-therapeutic INR on admit. INR became therapeutic on 11/22 but patient needs an HD line placed so new consult for holding warfarin and restarting heparin infusion when INR is  < 2.5  Patient needs HD line urgently today, goal INR <1.5 for procedure. Pt originally refused AM INR, drawn at 8:21 = 2.4  After discussion with Drs. Royce Macadamia and Statistician, giving:  -Vit K IV 1mg  x1  -FFP 1 unit - started at 11:35 and running over 90 min per RN  -Stat INR ordered for 13:15    Home Coumadin regimen: 5mg  TuThSS, 7.5mg  on MWF  Goal of Therapy:  INR 2-3 Monitor platelets by anticoagulation protocol: Yes   Plan:  Hold warfarin Give vit K IV 1mg  and FFP 1 unit Repeat INR stat after FFP, goal <1.5 for HD line placement Start heparin gtt at 1000 unit/hr after HD line placed  Monitor daily PT / INR, HL and CBC Monitor for signs/symptoms of bleeding    Benetta Spar, PharmD, BCPS,  BCCP Clinical Pharmacist  Please check AMION for all [place] phone numbers After 10:00 PM, call [place]

## 2019-07-05 NOTE — Progress Notes (Signed)
Progress Note  Patient Name: Amber Young Date of Encounter: 07/05/2019  Primary Cardiologist: Skeet Latch, MD   Subjective   NPO for HD access placement. Swelling improving with compression stockings  Inpatient Medications    Scheduled Meds: . sodium chloride   Intravenous Once  . amLODipine  10 mg Oral Daily  . atorvastatin  40 mg Oral q1800  . brimonidine  1 drop Left Eye BID  . carvedilol  25 mg Oral BID  . Chlorhexidine Gluconate Cloth  6 each Topical Q0600  . [START ON 07/06/2019] clopidogrel  75 mg Oral Daily  . dorzolamide-timolol  1 drop Left Eye BID  . ferrous sulfate  325 mg Oral QODAY  . hydrALAZINE  100 mg Oral TID  . insulin aspart  0-9 Units Subcutaneous Q4H  . isosorbide mononitrate  30 mg Oral Daily  . ketorolac  1 drop Right Eye QID  . metolazone  10 mg Oral Once  . ofloxacin  1 drop Right Eye TID   Continuous Infusions: . sodium chloride 250 mL (07/03/19 1720)  . furosemide 120 mg (07/04/19 1741)   PRN Meds: sodium chloride, acetaminophen, ALPRAZolam, nitroGLYCERIN, ondansetron (ZOFRAN) IV, zolpidem   Vital Signs    Vitals:   07/04/19 1104 07/04/19 2023 07/05/19 0609 07/05/19 0614  BP: (!) 151/59 (!) 150/64  (!) 157/69  Pulse: 64 63  61  Resp: 15 16  16   Temp: 98.3 F (36.8 C) 98.4 F (36.9 C)  97.9 F (36.6 C)  TempSrc: Oral Oral  Oral  SpO2: 99% 100%  99%  Weight:   79.7 kg   Height:        Intake/Output Summary (Last 24 hours) at 07/05/2019 0810 Last data filed at 07/05/2019 0600 Gross per 24 hour  Intake 602 ml  Output 850 ml  Net -248 ml   Last 3 Weights 07/05/2019 07/03/2019 07/02/2019  Weight (lbs) 175 lb 9.6 oz 172 lb 8 oz 174 lb  Weight (kg) 79.652 kg 78.245 kg 78.926 kg      Telemetry    Sinus rhythm - Personally Reviewed  ECG    No new tracings - Personally Reviewed  Physical Exam   GEN: No acute distress.   Neck: No JVD Cardiac: RRR, no murmurs, rubs, or gallops.  Respiratory: Clear to  auscultation bilaterally. GI: Soft, nontender, non-distended  MS: improving edema, compression stockings in place; No deformity. Neuro:  Nonfocal  Psych: Normal affect   Labs    High Sensitivity Troponin:   Recent Labs  Lab 06/15/19 0009 06/15/19 1748 06/16/19 0323 06/16/19 0550 06/16/19 0926  TROPONINIHS 76* 70* 86* 91* 96*      Chemistry Recent Labs  Lab 06/29/19 1648  07/02/19 0358 07/03/19 0509 07/04/19 0426  NA 135   < > 137 135 136  K 4.2   < > 4.4 4.6 4.6  CL 103   < > 99 96* 96*  CO2 23   < > 25 26 28   GLUCOSE 197*   < > 106* 114* 102*  BUN 59*   < > 69* 74* 83*  CREATININE 4.32*   < > 4.56* 5.15* 5.74*  CALCIUM 8.6*   < > 8.8* 8.9 9.2  PROT 6.3*  --   --   --   --   ALBUMIN 2.7*  --   --   --   --   AST 20  --   --   --   --   ALT 31  --   --   --   --  ALKPHOS 104  --   --   --   --   BILITOT 0.6  --   --   --   --   GFRNONAA 10*   < > 10* 8* 7*  GFRAA 12*   < > 11* 10* 8*  ANIONGAP 9   < > 13 13 12    < > = values in this interval not displayed.     Hematology Recent Labs  Lab 07/02/19 0358 07/03/19 0509 07/04/19 0426  WBC 5.6 6.6 6.0  RBC 3.79* 3.66* 3.57*  HGB 9.2* 8.8* 8.7*  HCT 28.8* 27.9* 27.2*  MCV 76.0* 76.2* 76.2*  MCH 24.3* 24.0* 24.4*  MCHC 31.9 31.5 32.0  RDW 16.3* 16.2* 16.3*  PLT 319 295 304    BNP Recent Labs  Lab 06/29/19 2211  BNP 2,340.4*     DDimer No results for input(s): DDIMER in the last 168 hours.   Radiology    No results found.  Cardiac Studies   2D echo 07/01/2019 IMPRESSIONS  1. Left ventricular ejection fraction, by visual estimation, is 35 to 40%. The left ventricle has moderately decreased function. There is mildly increased left ventricular hypertrophy. 2. Left ventricular diastolic parameters are consistent with Grade II diastolic dysfunction (pseudonormalization). 3. Mildly dilated left ventricular internal cavity size. 4. Diffuse hypokinesis worse in septum. 5. Global right  ventricle has normal systolic function.The right ventricular size is normal. No increase in right ventricular wall thickness. 6. Left atrial size was moderately dilated. 7. Right atrial size was normal. 8. The mitral valve is normal in structure. Mild mitral valve regurgitation. 9. The tricuspid valve is normal in structure. Tricuspid valve regurgitation mild-moderate. 10. The aortic valve is tricuspid. Aortic valve regurgitation is not visualized. Mild aortic valve sclerosis without stenosis. 11. The pulmonic valve was grossly normal. Pulmonic valve regurgitation is mild. 12. Moderately elevated pulmonary artery systolic pressure.  Patient Profile     63 y.o. female with a history of HTN, DM, HLD, CVA, PAF on coumadin, GERD,anemia, DES x2 RCA 1/29/2019w/ med rx for 40% LAD, 2017 LAD stent ok, EF 40-45% by echo 08/2017 w/ grade 3 dd, S-D-CHF, CKD IV (Dr Posey Pronto), hx med noncompliance at [facility] admitted with acute CHF exacerbation. FFP today for elevated INR so that HD access can be placed by IR this afternoon.  Assessment & Plan    1. Acute on chronic systolic and diastolic heart failure - EF this admission 35-40%, which is improved from 25-30% 01/2019 - coreg 25 mg BID - amlodipine 10 mg daily - hydralazine 100 mg TIC - imdur 30 mg daily - appreciate nephrology assistance with HD access for volume management - lower extremity edema improved with compression stockings - no ACEI/ARB   2. Acute on chronic kidney disease stage IV - FFP this morning - pt refused labs this morning - INR yesterday 2.5 - anticipate HD catheter placement today in IR - per nephrology - NPO today   3. CAD s/p DES to LAD 08/2017 - no ASA in the setting of anticoagulation - statin and BB   4. Paroxysmal atrial fibrillation - AC on hold for HD catheter placement - in sinus rhythm   5. Hypertension - pressure remains elevated today - appreciate HD/neprhology for better pressure control - may  need to titrate down medications with HD   6. Anemia - refused labs today - will draw labs following FFP - suspect anemia of chronic disease vs CKD   For questions or updates, please  contact [place] Please consult [email or web address] for contact info under        Signed, Ledora Bottcher, PA  07/05/2019, 8:10 AM

## 2019-07-05 NOTE — Progress Notes (Addendum)
Pt HD site on upper right chest showed a small amount of new blood. New gauze and pressure applied, bleeding has stopped. Will continue to monitor.

## 2019-07-05 NOTE — Consult Note (Signed)
Chief Complaint: Patient was seen in consultation today for tunneled vs nontunneled dialysis catheter placement Chief Complaint  Patient presents with   Shortness of Breath   at the request of Dr Katheren Puller   Supervising Physician: Aletta Edouard  Patient Status: [facility] - In-pt  History of Present Illness: Amber Young is a 63 y.o. female   HTN; HLD; Afib (INR 2.4 this am- off coumadin now) CHF and CAD (DES x2- 2019)  Acute on chronic renal disease Followed by Dr Posey Pronto Cr baseline 3.5--- admission to hospital 11/5-11/18 Cr 4.5 at DC Back to ED with worsening leg edema; wt gain and dyspnea  Cr 6.06 today Worsening renal function Need to initiate dialysis per Nephrology  INR 2.4 this am---getting FFP now On Plavix LD 11/24--(Brilinta as OP)-- LD 11/17  Request for tunneled dialysis catheter placement Discussed with Dr Kathlene Cote He is willing to move ahead today (todays dose Plavix held) But INR must come down form 2.4 Will recheck after FFP   Past Medical History:  Diagnosis Date   Anemia    Atrial fibrillation ([place]) 06/17/2019   Chronic combined systolic and diastolic CHF (congestive heart failure) ([place])    a. EF 40-45% in 2019 b. EF 25-30% by repeat echo in 01/2019   CKD (chronic kidney disease), stage IV ([place])    Coronary artery disease 08/2017   DES x 2 RCA   DKA (diabetic ketoacidoses) ([place]) 06/16/2019   GERD (gastroesophageal reflux disease)    Heart murmur    Hyperlipidemia    Hypertension    Proliferative diabetic retinopathy ([place])    left eye with vitreous hemorrhage and tractional retinal detachment   Stroke ([place]) 09/2014   numbness left upper lip, finger tips on left hand; "resolved" (05/18/2016)   Type II diabetes mellitus ([place])    Wears glasses     Past Surgical History:  Procedure Laterality Date   CARDIAC CATHETERIZATION N/A 05/18/2016   Procedure: Left Heart Cath and Coronary Angiography;  Surgeon: Jettie Booze,  MD;  Location: [place] [facility];  Service: Cardiovascular;  Laterality: N/A;   CARDIAC CATHETERIZATION N/A 05/18/2016   Procedure: Coronary Stent Intervention;  Surgeon: Jettie Booze, MD;  Location: [place] [facility];  Service: Cardiovascular;  Laterality: N/A;   CATARACT EXTRACTION Right 2020   COLONOSCOPY W/ BIOPSIES AND POLYPECTOMY     CORONARY ANGIOPLASTY     CORONARY ANGIOPLASTY WITH STENT PLACEMENT     CORONARY STENT INTERVENTION N/A 09/07/2017   Procedure: CORONARY STENT INTERVENTION;  Surgeon: Leonie Man, MD;  Location: [place] [facility];  Service: Cardiovascular;  Laterality: N/A;   EYE SURGERY     GAS INSERTION Left 01/13/2018   Procedure: INSERTION OF GAS;  Surgeon: Bernarda Caffey, MD;  Location: [place];  Service: Ophthalmology;  Laterality: Left;   LEFT HEART CATH AND CORONARY ANGIOGRAPHY N/A 09/07/2017   Procedure: LEFT HEART CATH AND CORONARY ANGIOGRAPHY;  Surgeon: Leonie Man, MD;  Location: [name] [facility];  Service: Cardiovascular;  Laterality: N/A;   MEMBRANE PEEL Left 01/13/2018   Procedure: MEMBRANE PEEL LEFT EYE ;  Surgeon: Bernarda Caffey, MD;  Location: [place];  Service: Ophthalmology;  Laterality: Left;   MULTIPLE TOOTH EXTRACTIONS     PARS PLANA VITRECTOMY Left 01/13/2018   Procedure: PARS PLANA VITRECTOMY WITH 25 GAUGE LEFT EYE WITH ENDOLASER;  Surgeon: Bernarda Caffey, MD;  Location: [place];  Service: Ophthalmology;  Laterality: Left;   TUBAL LIGATION  1980   VAGINAL  HYSTERECTOMY  1999   "partial; fibroids"    Allergies: Ace inhibitors  Medications: Prior to Admission medications   Medication Sig Start Date End Date Taking? Authorizing Provider  acetaminophen (TYLENOL) 500 MG tablet Take 1,000 mg by mouth every 6 (six) hours as needed for mild pain or headache.   Yes [provider]  amLODipine (NORVASC) 10 MG tablet Take 1 tablet (10 mg total) by mouth daily. 06/27/19  Yes Azzie Glatter, FNP  atorvastatin (LIPITOR) 40 MG  tablet Take 1 tablet (40 mg total) by mouth daily at 6 PM. 06/27/19  Yes Azzie Glatter, FNP  brimonidine (ALPHAGAN) 0.2 % ophthalmic solution Place 1 drop into the left eye 2 (two) times daily. 04/28/19  Yes [provider]  carvedilol (COREG) 25 MG tablet Take 1 tablet (25 mg total) by mouth 2 (two) times daily. 06/27/19  Yes Azzie Glatter, FNP  dorzolamide-timolol (COSOPT) 22.3-6.8 MG/ML ophthalmic solution Place 1 drop into the left eye 2 (two) times daily. 04/28/19  Yes [provider]  ferrous sulfate (FERROUSUL) 325 (65 FE) MG tablet Take 1 tablet (325 mg total) by mouth every other day. 06/27/19  Yes Azzie Glatter, FNP  furosemide (LASIX) 80 MG tablet Take 80 mg by mouth daily.    Yes [provider]  glipiZIDE (GLUCOTROL) 10 MG tablet Take 1 tablet (10 mg total) by mouth 2 (two) times daily before a meal. 06/27/19  Yes Azzie Glatter, FNP  hydrALAZINE (APRESOLINE) 100 MG tablet Take 1 tablet (100 mg total) by mouth 3 (three) times daily. 06/27/19  Yes Azzie Glatter, FNP  Insulin Glargine (LANTUS) 100 UNIT/ML Solostar Pen Inject 4 Units into the skin daily. 06/27/19  Yes Azzie Glatter, FNP  Insulin Pen Needle (TRUEPLUS PEN NEEDLES) 31G X 6 MM MISC USE AS DIRECTED 06/27/19  Yes Azzie Glatter, FNP  isosorbide dinitrate (ISORDIL) 20 MG tablet Take 1 tablet (20 mg total) by mouth 3 (three) times daily. 06/27/19  Yes Azzie Glatter, FNP  ketorolac (ACULAR) 0.4 % SOLN Place 1 drop into the right eye 4 (four) times daily. 04/28/19  Yes [provider]  nitroGLYCERIN (NITROSTAT) 0.4 MG SL tablet Place 1 tablet (0.4 mg total) under the tongue every 5 (five) minutes x 3 doses as needed for chest pain. 06/27/19  Yes Azzie Glatter, FNP  ofloxacin (OCUFLOX) 0.3 % ophthalmic solution Place 1 drop into the right eye 3 (three) times daily. 04/28/19  Yes [provider]  ticagrelor (BRILINTA) 90 MG TABS tablet Take 1 tablet (90 mg total)  by mouth 2 (two) times daily. 06/27/19  Yes Azzie Glatter, FNP  warfarin (COUMADIN) 5 MG tablet Take 5mg  daily except for 7.5mg  every Monday, Wednesday and Friday Patient taking differently: Take 5-7.5 mg by mouth See admin instructions. Take 5mg  daily except for 7.5mg  every Monday, Wednesday and Friday 06/27/19  Yes Azzie Glatter, FNP     Family History  Problem Relation Age of Onset   Diabetes Mother    Hypertension Mother    COPD Mother    Heart failure Mother    Hypertension Sister    Hypertension Brother     Social History   Socioeconomic History   Marital status: Married    Spouse name: Not on file   Number of children: Not on file   Years of education: 16   Highest education level: Not on file  Occupational History   Occupation: hairdresser  Social Needs  Financial resource strain: Not on file   Food insecurity    Worry: Not on file    Inability: Not on file   Transportation needs    Medical: Not on file    Non-medical: Not on file  Tobacco Use   Smoking status: Never Smoker   Smokeless tobacco: Never Used  Substance and Sexual Activity   Alcohol use: No    Alcohol/week: 0.0 standard drinks   Drug use: No   Sexual activity: Yes  Lifestyle   Physical activity    Days per week: Not on file    Minutes per session: Not on file   Stress: Not on file  Relationships   Social connections    Talks on phone: Not on file    Gets together: Not on file    Attends religious service: Not on file    Active member of club or organization: Not on file    Attends meetings of clubs or organizations: Not on file    Relationship status: Not on file  Other Topics Concern   Not on file  Social History Narrative   Married, 2 children, hairdresser   Caffeine use- occasional tea or coffee   Right handed    Review of Systems: A 12 point ROS discussed and pertinent positives are indicated in the HPI above.  All other systems are  negative.  Review of Systems  Constitutional: Positive for activity change, appetite change and fatigue. Negative for fever.  Respiratory: Positive for shortness of breath. Negative for cough.   Cardiovascular: Positive for leg swelling.  Gastrointestinal: Negative for abdominal pain.  Neurological: Positive for weakness.  Psychiatric/Behavioral: Negative for behavioral problems and confusion.    Vital Signs: BP (!) 157/69 (BP Location: Left Arm)    Pulse 61    Temp 97.9 F (36.6 C) (Oral)    Resp 16    Ht 5\' 7"  (1.702 m)    Wt 175 lb 9.6 oz (79.7 kg)    SpO2 99%    BMI 27.50 kg/m   Physical Exam Vitals signs reviewed.  Pulmonary:     Breath sounds: Examination of the right-upper field reveals wheezing. Examination of the left-upper field reveals wheezing. Examination of the right-middle field reveals wheezing. Examination of the left-middle field reveals wheezing. Examination of the right-lower field reveals wheezing. Examination of the left-lower field reveals wheezing. Wheezing present.  Abdominal:     Palpations: Abdomen is soft.  Musculoskeletal: Normal range of motion.  Skin:    General: Skin is warm and dry.  Neurological:     Mental Status: She is alert and oriented to person, place, and time.  Psychiatric:        Behavior: Behavior normal.     Imaging: Dg Chest 2 View  Result Date: 06/29/2019 CLINICAL DATA:  Shortness of breath EXAM: CHEST - 2 VIEW COMPARISON:  June 15, 2019 FINDINGS: No new consolidation or edema. No pleural effusion or pneumothorax. Stable mild cardiomegaly. Coronary artery stent is noted. No acute osseous abnormality. IMPRESSION: No acute process in the chest.  Stable mild cardiomegaly. Electronically Signed   By: Macy Mis M.D.   On: 06/29/2019 17:15   Dg Chest 2 View  Result Date: 06/15/2019 CLINICAL DATA:  Weakness, loss of appetite EXAM: CHEST - 2 VIEW COMPARISON:  01/23/2019 FINDINGS: Cardiomegaly. No confluent opacities, effusions or  edema. No acute bony abnormality. Old healed proximal left humeral fracture. IMPRESSION: Cardiomegaly.  No active disease. Electronically Signed   By: Rolm Baptise  M.D.   On: 06/15/2019 19:10   Ct Head Wo Contrast  Result Date: 06/16/2019 CLINICAL DATA:  Altered mental status EXAM: CT HEAD WITHOUT CONTRAST TECHNIQUE: Contiguous axial images were obtained from the base of the skull through the vertex without intravenous contrast. COMPARISON:  CT head dated 09/30/2014 FINDINGS: Brain: No evidence of acute infarction, hemorrhage, hydrocephalus, extra-axial collection or mass lesion/mass effect. Atrophy and chronic microvascular ischemic changes are noted. Vascular: No hyperdense vessel or unexpected calcification. Skull: Normal. Negative for fracture or focal lesion. Sinuses/Orbits: No acute finding. Other: None. IMPRESSION: No acute intracranial abnormality Electronically Signed   By: Constance Holster M.D.   On: 06/16/2019 03:17   Mr Brain Wo Contrast  Addendum Date: 06/17/2019   ADDENDUM REPORT: 06/17/2019 18:38 ADDENDUM: Upon further review, there are few punctate foci of diffusion weighted hyperintensity within the anterior right corona radiata, some of which demonstrate intermediate signal on the ADC map, and are suspicious for subacute ischemic change. Additionally, there is patchy restricted diffusion with the splenium of the corpus callosum. Although nonspecific, this may reflect a cytotoxic lesion of the corpus callosum. This has a broad differential of causes, one of which is diabetic ketoacidosis. Addended findings called by telephone at the time of interpretation on 06/17/2019 at 6:31 pm to provider Dr. Maylene Roes, who verbally acknowledged these results. Electronically Signed   By: Kellie Simmering DO   On: 06/17/2019 18:38   Result Date: 06/17/2019 CLINICAL DATA:  Encephalopathy. Additional history provided: Altered mental status since Sunday. Found in diabetic ketoacidosis. EXAM: MRI HEAD WITHOUT  CONTRAST TECHNIQUE: Multiplanar, multiecho pulse sequences of the brain and surrounding structures were obtained without intravenous contrast. COMPARISON:  Head CT 06/16/2019 FINDINGS: Multiple sequences are significantly motion degraded, limiting evaluation. Most notably, there is moderate motion degradation of the coronal diffusion-weighted imaging, severe motion degradation of the susceptibility weighted imaging, moderate motion degradation of the axial and sagittal T1 weighted imaging. Brain: There is no convincing evidence of acute infarct. No evidence of intracranial mass. No midline shift or extra-axial fluid collection. Severe motion degradation of the susceptibility weighted imaging limits evaluation for chronic intracranial blood products. Redemonstrated chronic lacunar infarct within the right thalamus. There is a background of moderate chronic small vessel ischemic disease. Mild generalized parenchymal atrophy. Vascular: Flow voids maintained within the proximal large arterial vessels. Skull and upper cervical spine: No focal marrow lesion identified on motion degraded imaging. Sinuses/Orbits: Visualized orbits demonstrate no acute abnormality. Minimal ethmoid sinus mucosal thickening. No significant mastoid effusion. IMPRESSION: 1. Motion degraded and limited examination as described. 2. No evidence of acute intracranial abnormality. 3. Generalized parenchymal atrophy with moderate chronic small vessel ischemic disease. Redemonstrated chronic right thalamic lacunar infarct. Electronically Signed: By: Kellie Simmering DO On: 06/17/2019 12:59   Nm Hepatobiliary Liver Func  Result Date: 06/16/2019 CLINICAL DATA:  Incidentally noted cholelithiasis and gallbladder wall thickening on renal ultrasound. EXAM: NUCLEAR MEDICINE HEPATOBILIARY IMAGING TECHNIQUE: Sequential images of the abdomen were obtained out to 60 minutes following intravenous administration of radiopharmaceutical. RADIOPHARMACEUTICALS:  5.26  mCi Tc-57m  Choletec IV COMPARISON:  Same-day renal ultrasound FINDINGS: Prompt uptake and biliary excretion of activity by the liver is seen. Gallbladder activity is visualized, consistent with patency of cystic duct. Biliary activity passes into small bowel, consistent with patent common bile duct. Some reflux of tracer into the stomach is noted. IMPRESSION: No scintigraphic evidence of acute cholecystitis. Electronically Signed   By: Lovena Le M.D.   On: 06/16/2019 19:47   US Renal  Result Date: 06/16/2019 CLINICAL DATA:  63 year old female with a history of acute kidney injury EXAM: RENAL / URINARY TRACT ULTRASOUND COMPLETE COMPARISON:  None. FINDINGS: Right Kidney: Length: 11.0 cm x 4.3 cm x 4.7 cm, 117 cc. Echogenicity within normal limits. No mass or hydronephrosis visualized. Left Kidney: Length: 10.5 cm x 6.3 cm x 6.4 cm, 221 cc. Echogenicity within normal limits. No mass or hydronephrosis visualized. Bladder: Bladder partially distended.  Bladder jets not visualized. Additional: Gallbladder demonstrates circumferential gallbladder wall thickening of at least 1 cm. Echogenic material within the gallbladder lumen. Sonographic Murphy's sign not assessed given patient mental status. IMPRESSION: Negative for hydronephrosis. **An incidental finding of potential clinical significance has been found. Circumferential gallbladder wall thickening with potential sludge/stones, may represent incidental finding of cholecystitis. If there is concern for ductal obstruction and acute cholecystitis, recommend correlation with patient presentation, lab values and potentially HIDA study.** Electronically Signed   By: Corrie Mckusick D.O.   On: 06/16/2019 09:54    Labs:  CBC: Recent Labs    07/02/19 0358 07/03/19 0509 07/04/19 0426 07/05/19 0821  WBC 5.6 6.6 6.0 6.8  HGB 9.2* 8.8* 8.7* 8.2*  HCT 28.8* 27.9* 27.2* 25.5*  PLT 319 295 304 282    COAGS: Recent Labs    06/17/19 1205  06/29/19 1824   07/03/19 1758 07/04/19 0426 07/04/19 1745 07/05/19 0821  INR  --    < >  --    < > 2.8* 2.8* 2.5* 2.4*  APTT 28  --  29  --   --   --   --   --    < > = values in this interval not displayed.    BMP: Recent Labs    07/02/19 0358 07/03/19 0509 07/04/19 0426 07/05/19 0821  NA 137 135 136 132*  K 4.4 4.6 4.6 3.6  CL 99 96* 96* 93*  CO2 25 26 28 25   GLUCOSE 106* 114* 102* 109*  BUN 69* 74* 83* 91*  CALCIUM 8.8* 8.9 9.2 8.9  CREATININE 4.56* 5.15* 5.74* 6.06*  GFRNONAA 10* 8* 7* 7*  GFRAA 11* 10* 8* 8*    LIVER FUNCTION TESTS: Recent Labs    01/24/19 0624 06/16/19 0926 06/18/19 0317 06/29/19 1648 07/05/19 0821  BILITOT 0.4 1.4* 1.1 0.6  --   AST 19 250* 100* 20  --   ALT 14 224* 178* 31  --   ALKPHOS 67 109 90 104  --   PROT 4.9* 5.9* 5.3* 6.3*  --   ALBUMIN 2.0* 2.6* 2.3* 2.7* 2.9*    TUMOR MARKERS: No results for input(s): AFPTM, CEA, CA199, CHROMGRNA in the last 8760 hours.  Assessment and Plan:  Acute on chronic renal disease Worsening renal function Need to initiate dialysis per Nephrology  On Plavix-- LD 11/24 - Dr Kathlene Cote fine with this Now off coumadin -- LD 11/18 INR 2.4 today- getting FFP now Will recheck INR after FFP Would need to be close to 1.5 to move ahead with tunneled dialysis catheter placement If INR still too high-- IR can placed non tunneled catheter today  Risks and benefits discussed with the patient including, but not limited to bleeding, infection, vascular injury, pneumothorax which may require chest tube placement, air embolism or even death  All of the patient's questions were answered, patient is agreeable to proceed. Consent signed and in chart.   Thank you for this interesting consult.  I greatly enjoyed meeting Amber Young and look forward to participating in their care.  A copy of this report was sent to the requesting provider on this date.  Electronically Signed: Lavonia Drafts, PA-C 07/05/2019, 9:36  AM   I spent a total of 20 Minutes    in face to face in clinical consultation, greater than 50% of which was counseling/coordinating care for tunneled vs nontunneled HD catheter placement

## 2019-07-05 NOTE — Progress Notes (Signed)
ANTICOAGULATION CONSULT NOTE  Pharmacy Consult:  Heparin / Coumadin Indication: atrial fibrillation (CHADS2VASc = 7),  Patient Measurements: Height: 5\' 7"  (170.2 cm) Weight: 175 lb 9.6 oz (79.7 kg) IBW/kg (Calculated) : 61.6 Heparin Dosing Weight: 76.8 kg   Vital Signs: Temp: 99.2 F (37.3 C) (11/25 1320) Temp Source: Oral (11/25 1320) BP: 163/59 (11/25 1859) Pulse Rate: 62 (11/25 1320)  Labs: Recent Labs    07/03/19 0509  07/04/19 0426 07/04/19 1745 07/05/19 0821 07/05/19 1358  HGB 8.8*  --  8.7*  --  8.2*  --   HCT 27.9*  --  27.2*  --  25.5*  --   PLT 295  --  304  --  282  --   LABPROT 29.3*   < > 29.4* 26.7* 26.0* 20.5*  INR 2.8*   < > 2.8* 2.5* 2.4* 1.8*  CREATININE 5.15*  --  5.74*  --  6.06*  --    < > = values in this interval not displayed.    Estimated Creatinine Clearance: 10.3 mL/min (A) (by C-G formula based on SCr of 6.06 mg/dL (H)).   Assessment: 63 yo female presented on 06/29/2019 with SOB and CHF exacerbation. Patient has a history of Afib and on Coumadin PTA. Pharmacy originally consulted bridge IV heparin to Coumadin given sub-therapeutic INR on admit. INR became therapeutic on 11/22 but patient needs an HD line placed so new consult for holding warfarin and restarting heparin infusion when INR is  < 2.5  Patient needs HD line urgently today, goal INR <1.5 for procedure. Pt originally refused AM INR, drawn at 8:21 = 2.4  After discussion with Drs. Foster and Statistician, giving:  -Vit K IV 1mg  x1  -FFP 1 unit - started at 11:35 and running over 90 min per RN   Due to elevated INR and urgency of procedure, IR placed non-tunneled HD catheter. D/w IR and ok to start heparin now. Last INR was 1.8.   Home Coumadin regimen: 5mg  TuThSS, 7.5mg  on MWF  Goal of Therapy:  Heparin level goal 0.3-0.7 INR 2-3 Monitor platelets by anticoagulation protocol: Yes   Plan:   Start heparin at 1100 units/hr Check heparin level in 8 hours Daily CBC and heparin  level  Erin Hearing PharmD., BCPS Clinical Pharmacist 07/05/2019 7:11 PM  Please check AMION for all [place] phone numbers After 10:00 PM, call [place] [phone]

## 2019-07-05 NOTE — Procedures (Signed)
Interventional Radiology Procedure Note  Procedure: Right IJ non-tunneled HD catheter placement  Complications: None  Estimated Blood Loss: < 10 mL  Findings: 16 cm Mahurkar triple lumen catheter via right IJ. Tip at SVC/RA junction. OK to use.  Venetia Night. Kathlene Cote, M.D Pager:  [phone]

## 2019-07-05 NOTE — Progress Notes (Signed)
Provider note from yesterday stated NPO after midnight for possible HD cath placement.  No orders were placed for NPO status but patient did not have anything to eat after midnight and had 1/4 cup of water this morning around 0500.  RN made patient aware to not eat or drink anything further.

## 2019-07-05 NOTE — Progress Notes (Signed)
Patient refused lab draws while phlebotomy was in the room and draw was reitmed for later this morning.  After RN spoke with patent, patient agreed to allow the blood draw next time lab came to the room.

## 2019-07-05 NOTE — Progress Notes (Signed)
[facility] Progress Note  Name: Ellina Tranberg MRN: [id number] DOB: [birth year]  Chief Complaint:  Shortness of breath   Subjective:  She had 850 mL UOP over 11/24 on a regimen of lasix 120 mg IV BID and metolazone. Refused labs earlier this morning.  She had labs last night with INR 2.5.  She consents to getting FFP.  We discussed starting HD today and she is in agreement.   Review of systems:   Denies any shortness of breath  No n/v No chest pain  ---------- Background on consult:  Pt is a 50F with a PMH sig for HTN, HLD, Afib, CHF with EF 25-30%, CKD with baseline Cr around 3 who is now seen in consultation at the request of Dr. Harl Bowie for evaluation and recommendations surrounding AKI on CKD IV. Typically follows with Dr. Posey Pronto.  She was recently admitted 11/5-11/8 with AKI on CKD and CHF exac.  D/c Cr was 4.5 at that time, usually baseline is around 3.5.  Losartan/ Lasix/ metolazone held at d/c.  Presented back to CHF clinic 11/19 with increased LE edema, weights up, and DOE.  She was admitted.  Started on IV Lasix 80 TID.  Has been making about 2L urine daily.  Weights have been slowly going down but are only down about 4lbs.  Metolazone has been held today.    Cr 4.3 on admission, is 4.5 today.  In this setting we are asked to see.  Pt reports her legs feel tight and uncomfortable.  BP is high.  On a nitro gtt now.  She denies CP.  No HA/ blurry vision.      Intake/Output Summary (Last 24 hours) at 07/05/2019 0755 Last data filed at 07/05/2019 0600 Gross per 24 hour  Intake 602 ml  Output 850 ml  Net -248 ml    Vitals:  Vitals:   07/04/19 1104 07/04/19 2023 07/05/19 0609 07/05/19 0614  BP: (!) 151/59 (!) 150/64  (!) 157/69  Pulse: 64 63  61  Resp: 15 16  16   Temp: 98.3 F (36.8 C) 98.4 F (36.9 C)  97.9 F (36.6 C)  TempSrc: Oral Oral  Oral  SpO2: 99% 100%  99%  Weight:   79.7 kg   Height:         Physical Exam:  General adult female seated in no  acute distress  HEENT normocephalic atraumatic extraocular movements intact sclera anicteric Neck supple trachea midline Lungs reduced breath sounds and basilar crackles; normal work of breathing at rest  Heart regular rate and rhythm no rubs or gallops appreciated Abdomen soft nontender nondistended Extremities 2-3+ edema  Psych normal mood and affect Neuro - alert and oriented x 3; follows commands and provides hx  Medications reviewed   Labs:  BMP Latest Ref Rng & Units 07/04/2019 07/03/2019 07/02/2019  Glucose 70 - 99 mg/dL 102(H) 114(H) 106(H)  BUN 8 - 23 mg/dL 83(H) 74(H) 69(H)  Creatinine 0.44 - 1.00 mg/dL 5.74(H) 5.15(H) 4.56(H)  BUN/Creat Ratio 12 - 28 - - -  Sodium 135 - 145 mmol/L 136 135 137  Potassium 3.5 - 5.1 mmol/L 4.6 4.6 4.4  Chloride 98 - 111 mmol/L 96(L) 96(L) 99  CO2 22 - 32 mmol/L 28 26 25   Calcium 8.9 - 10.3 mg/dL 9.2 8.9 8.8(L)     Assessment/Plan:   1.  AKI on CKD IV:  Multiple recent insults; baseline 3.5 in June and now with recent admission for DKA where d/c Cr was 4.6 11/8.  Now with difficulty getting fluid off. Discussed with pt-- she follows with Dr Posey Pronto and has discussed dialysis before.   - lasix as 120 mg IV BID with metolazone 10 mg before next dose  - pharmacy is following for initiation of heparin gtt when INR under 2.5   - NPO for procedure - INR now and after FFP  - IR consult for tunneled catheter today  - HD today after access placement.  Plan following HD on 11/27  2.  Acute on chronic CHF: EF 25-30%.  Cardiology following.  3.  DM II: recent admission for DKA  4.  Afib: on heparin gtt in anticipation of dialysis access   5. HTN - with overload - diurese as above    6. Anemia of CKD - iron stores acceptable.  aranesp 40 mcg on 11/23   Claudia Desanctis, MD 07/05/2019 7:54 AM

## 2019-07-06 DIAGNOSIS — R58 Hemorrhage, not elsewhere classified: Secondary | ICD-10-CM

## 2019-07-06 LAB — RENAL FUNCTION PANEL
Albumin: 2.8 g/dL — ABNORMAL LOW (ref 3.5–5.0)
Anion gap: 13 (ref 5–15)
BUN: 57 mg/dL — ABNORMAL HIGH (ref 8–23)
CO2: 26 mmol/L (ref 22–32)
Calcium: 8.4 mg/dL — ABNORMAL LOW (ref 8.9–10.3)
Chloride: 95 mmol/L — ABNORMAL LOW (ref 98–111)
Creatinine, Ser: 4.45 mg/dL — ABNORMAL HIGH (ref 0.44–1.00)
GFR calc Af Amer: 11 mL/min — ABNORMAL LOW (ref >60)
GFR calc non Af Amer: 10 mL/min — ABNORMAL LOW (ref >60)
Glucose, Bld: 116 mg/dL — ABNORMAL HIGH (ref 70–99)
Phosphorus: 3.6 mg/dL (ref 2.5–4.6)
Potassium: 3.5 mmol/L (ref 3.5–5.1)
Sodium: 134 mmol/L — ABNORMAL LOW (ref 135–145)

## 2019-07-06 LAB — CBC
HCT: 23.2 % — ABNORMAL LOW (ref 36.0–46.0)
HCT: 24.2 % — ABNORMAL LOW (ref 36.0–46.0)
Hemoglobin: 7.6 g/dL — ABNORMAL LOW (ref 12.0–15.0)
Hemoglobin: 7.8 g/dL — ABNORMAL LOW (ref 12.0–15.0)
MCH: 24.4 pg — ABNORMAL LOW (ref 26.0–34.0)
MCH: 24.5 pg — ABNORMAL LOW (ref 26.0–34.0)
MCHC: 32.8 g/dL (ref 30.0–36.0)
MCHC: 32.2 g/dL (ref 30.0–36.0)
MCV: 74.4 fL — ABNORMAL LOW (ref 80.0–100.0)
MCV: 76.1 fL — ABNORMAL LOW (ref 80.0–100.0)
Platelets: 246 10*3/uL (ref 150–400)
Platelets: 242 10*3/uL (ref 150–400)
RBC: 3.12 MIL/uL — ABNORMAL LOW (ref 3.87–5.11)
RBC: 3.18 MIL/uL — ABNORMAL LOW (ref 3.87–5.11)
RDW: 16.3 % — ABNORMAL HIGH (ref 11.5–15.5)
RDW: 16.2 % — ABNORMAL HIGH (ref 11.5–15.5)
WBC: 6.1 10*3/uL (ref 4.0–10.5)
WBC: 6.5 10*3/uL (ref 4.0–10.5)
nRBC: 0 % (ref 0.0–0.2)
nRBC: 0 % (ref 0.0–0.2)

## 2019-07-06 LAB — HEPARIN LEVEL (UNFRACTIONATED)
Heparin Unfractionated: 0.39 IU/mL (ref 0.30–0.70)
Heparin Unfractionated: 0.11 IU/mL — ABNORMAL LOW (ref 0.30–0.70)

## 2019-07-06 LAB — GLUCOSE, CAPILLARY
Glucose-Capillary: 110 mg/dL — ABNORMAL HIGH (ref 70–99)
Glucose-Capillary: 117 mg/dL — ABNORMAL HIGH (ref 70–99)
Glucose-Capillary: 145 mg/dL — ABNORMAL HIGH (ref 70–99)
Glucose-Capillary: 156 mg/dL — ABNORMAL HIGH (ref 70–99)
Glucose-Capillary: 170 mg/dL — ABNORMAL HIGH (ref 70–99)
Glucose-Capillary: 175 mg/dL — ABNORMAL HIGH (ref 70–99)

## 2019-07-06 LAB — PROTIME-INR
INR: 1.5 — ABNORMAL HIGH (ref 0.8–1.2)
Prothrombin Time: 17.6 seconds — ABNORMAL HIGH (ref 11.4–15.2)

## 2019-07-06 MED ORDER — HEPARIN SODIUM (PORCINE) 1000 UNIT/ML IJ SOLN
INTRAMUSCULAR | Status: AC
  Administered 2019-07-06: 2600 [IU] via INTRAVENOUS_CENTRAL
  Filled 2019-07-06: qty 3

## 2019-07-06 MED ORDER — CHLORHEXIDINE GLUCONATE CLOTH 2 % EX PADS
6.0000 | MEDICATED_PAD | Freq: Every day | CUTANEOUS | Status: DC
  Administered 2019-07-07 – 2019-07-13 (×5): 6 via TOPICAL

## 2019-07-06 MED ORDER — CHLORHEXIDINE GLUCONATE CLOTH 2 % EX PADS
6.0000 | MEDICATED_PAD | Freq: Every day | CUTANEOUS | Status: DC
  Administered 2019-07-06 – 2019-07-09 (×4): 6 via TOPICAL

## 2019-07-06 NOTE — Progress Notes (Signed)
Progress Note  Patient Name: Amber Young Date of Encounter: 07/06/2019  Primary Cardiologist: Skeet Latch, MD   Subjective   Feeling well.  Still short of breath if she lays flat but otherwise improved.   Inpatient Medications    Scheduled Meds:  amLODipine  10 mg Oral Daily   atorvastatin  40 mg Oral q1800   brimonidine  1 drop Left Eye BID   carvedilol  25 mg Oral BID   Chlorhexidine Gluconate Cloth  6 each Topical Daily   dorzolamide-timolol  1 drop Left Eye BID   ferrous sulfate  325 mg Oral QODAY   hydrALAZINE  100 mg Oral TID   insulin aspart  0-9 Units Subcutaneous Q4H   isosorbide mononitrate  30 mg Oral Daily   ketorolac  1 drop Right Eye QID   ofloxacin  1 drop Right Eye TID   Continuous Infusions:  sodium chloride 250 mL (07/03/19 1720)    ceFAZolin (ANCEF) IV     furosemide 120 mg (07/05/19 1934)   heparin 1,100 Units/hr (07/05/19 2100)   PRN Meds: sodium chloride, acetaminophen, ALPRAZolam, heparin, lidocaine, nitroGLYCERIN, ondansetron (ZOFRAN) IV, zolpidem   Vital Signs    Vitals:   07/06/19 0600 07/06/19 0717 07/06/19 0840 07/06/19 0908  BP:  (!) 163/68 (!) 160/54 (!) 156/57  Pulse:  64 66 (!) 57  Resp:   20   Temp:   97.8 F (36.6 C) 97.6 F (36.4 C)  TempSrc:   Oral Oral  SpO2:   97% 95%  Weight: 78.3 kg     Height:        Intake/Output Summary (Last 24 hours) at 07/06/2019 0934 Last data filed at 07/06/2019 0902 Gross per 24 hour  Intake 1192.9 ml  Output 2000 ml  Net -807.1 ml   Last 3 Weights 07/06/2019 07/05/2019 07/05/2019  Weight (lbs) 172 lb 9.6 oz 176 lb 12.9 oz 175 lb 9.6 oz  Weight (kg) 78.291 kg 80.2 kg 79.652 kg      Telemetry    Sinus rhythm - Personally Reviewed  ECG    n/a - Personally Reviewed  Physical Exam   VS:  BP (!) 156/57 (BP Location: Left Arm)    Pulse (!) 57    Temp 97.6 F (36.4 C) (Oral)    Resp 20    Ht 5\' 7"  (1.702 m)    Wt 78.3 kg    SpO2 95%    BMI 27.03 kg/m  ,  BMI Body mass index is 27.03 kg/m. GENERAL:  Well appearing.  No acute distress. HEENT: Pupils equal round and reactive, fundi not visualized, oral mucosa unremarkable NECK:  + jugular venous distention, waveform within normal limits, carotid upstroke brisk and symmetric, no bruits, no thyromegaly CHEST: R subclavian catheter with oozing of blood at dressing site. LUNGS:  Bibasilar crackles HEART:  RRR.  PMI not displaced or sustained,S1 and S2 within normal limits, no S3, no S4, no clicks, no rubs, no murmurs ABD:  Flat, positive bowel sounds normal in frequency in pitch, no bruits, no rebound, no guarding, no midline pulsatile mass, no hepatomegaly, no splenomegaly EXT:  2 plus pulses throughout, 2+ LE edema to lower tibia bilaterally, no cyanosis no clubbing SKIN:  No rashes no nodules NEURO:  Cranial nerves II through XII grossly intact, motor grossly intact throughout PSYCH:  Cognitively intact, oriented to person place and time   Labs    High Sensitivity Troponin:   Recent Labs  Lab 06/15/19 0009 06/15/19 1748  06/16/19 0323 06/16/19 0550 06/16/19 0926  TROPONINIHS 76* 70* 86* 91* 96*      Chemistry Recent Labs  Lab 06/29/19 1648  07/04/19 0426 07/05/19 0821 07/06/19 0335  NA 135   < > 136 132* 134*  K 4.2   < > 4.6 3.6 3.5  CL 103   < > 96* 93* 95*  CO2 23   < > 28 25 26   GLUCOSE 197*   < > 102* 109* 116*  BUN 59*   < > 83* 91* 57*  CREATININE 4.32*   < > 5.74* 6.06* 4.45*  CALCIUM 8.6*   < > 9.2 8.9 8.4*  PROT 6.3*  --   --   --   --   ALBUMIN 2.7*  --   --  2.9* 2.8*  AST 20  --   --   --   --   ALT 31  --   --   --   --   ALKPHOS 104  --   --   --   --   BILITOT 0.6  --   --   --   --   GFRNONAA 10*   < > 7* 7* 10*  GFRAA 12*   < > 8* 8* 11*  ANIONGAP 9   < > 12 14 13    < > = values in this interval not displayed.     Hematology Recent Labs  Lab 07/04/19 0426 07/05/19 0821 07/06/19 0335  WBC 6.0 6.8 6.1  RBC 3.57* 3.39* 3.12*  HGB 8.7* 8.2* 7.6*   HCT 27.2* 25.5* 23.2*  MCV 76.2* 75.2* 74.4*  MCH 24.4* 24.2* 24.4*  MCHC 32.0 32.2 32.8  RDW 16.3* 16.3* 16.3*  PLT 304 282 246    BNP Recent Labs  Lab 06/29/19 2211  BNP 2,340.4*     DDimer No results for input(s): DDIMER in the last 168 hours.   Radiology    Ir Fluoro Guide Cv Line Right  Result Date: 07/05/2019 CLINICAL DATA:  Renal failure and need for hemodialysis catheter. EXAM: NON-TUNNELED CENTRAL VENOUS HEMODIALYSIS CATHETER PLACEMENT WITH ULTRASOUND AND FLUOROSCOPIC GUIDANCE FLUOROSCOPY TIME:  6 seconds.  0.2 mGy. PROCEDURE: The procedure, risks, benefits, and alternatives were explained to the patient. Questions regarding the procedure were encouraged and answered. The patient understands and consents to the procedure. A time-out was performed prior to initiating the procedure. The right neck and chest were prepped with chlorhexidine in a sterile fashion, and a sterile drape was applied covering the operative field. Maximum barrier sterile technique with sterile gowns and gloves were used for the procedure. Local anesthesia was provided with 1% lidocaine. Ultrasound was used to confirm patency of the right internal jugular vein. After creating a small venotomy incision, a 21 gauge needle was advanced into the right internal jugular vein under direct, real-time ultrasound guidance. Ultrasound image documentation was performed. After securing guidewire access, the venotomy was dilated. A triple-lumen, 16 cm, 13 [place]kar catheter was advanced over the wire. Final catheter positioning was confirmed and documented with a fluoroscopic spot image. The catheter was aspirated, flushed with saline, and injected with appropriate volume heparin dwells. The catheter exit site was secured with 0-Prolene retention sutures. COMPLICATIONS: None.  No pneumothorax. FINDINGS: After catheter placement, the tip lies at the cavoatrial junction. The catheter aspirates normally and is ready for  immediate use. IMPRESSION: Placement of non-tunneled central venous hemodialysis catheter via the right internal jugular vein. The catheter tip lies at the cavoatrial  junction. The catheter is ready for immediate use. Electronically Signed   By: Aletta Edouard M.D.   On: 07/05/2019 17:59   Ir US Guide Vasc Access Right  Result Date: 07/05/2019 CLINICAL DATA:  Renal failure and need for hemodialysis catheter. EXAM: NON-TUNNELED CENTRAL VENOUS HEMODIALYSIS CATHETER PLACEMENT WITH ULTRASOUND AND FLUOROSCOPIC GUIDANCE FLUOROSCOPY TIME:  6 seconds.  0.2 mGy. PROCEDURE: The procedure, risks, benefits, and alternatives were explained to the patient. Questions regarding the procedure were encouraged and answered. The patient understands and consents to the procedure. A time-out was performed prior to initiating the procedure. The right neck and chest were prepped with chlorhexidine in a sterile fashion, and a sterile drape was applied covering the operative field. Maximum barrier sterile technique with sterile gowns and gloves were used for the procedure. Local anesthesia was provided with 1% lidocaine. Ultrasound was used to confirm patency of the right internal jugular vein. After creating a small venotomy incision, a 21 gauge needle was advanced into the right internal jugular vein under direct, real-time ultrasound guidance. Ultrasound image documentation was performed. After securing guidewire access, the venotomy was dilated. A triple-lumen, 16 cm, 13 [place] Mah[place]kar catheter was advanced over the wire. Final catheter positioning was confirmed and documented with a fluoroscopic spot image. The catheter was aspirated, flushed with saline, and injected with appropriate volume heparin dwells. The catheter exit site was secured with 0-Prolene retention sutures. COMPLICATIONS: None.  No pneumothorax. FINDINGS: After catheter placement, the tip lies at the cavoatrial junction. The catheter aspirates normally and is  ready for immediate use. IMPRESSION: Placement of non-tunneled central venous hemodialysis catheter via the right internal jugular vein. The catheter tip lies at the cavoatrial junction. The catheter is ready for immediate use. Electronically Signed   By: Aletta Edouard M.D.   On: 07/05/2019 17:59    Cardiac Studies   2D echo 07/01/2019 IMPRESSIONS  1. Left ventricular ejection fraction, by visual estimation, is 35 to 40%. The left ventricle has moderately decreased function. There is mildly increased left ventricular hypertrophy. 2. Left ventricular diastolic parameters are consistent with Grade II diastolic dysfunction (pseudonormalization). 3. Mildly dilated left ventricular internal cavity size. 4. Diffuse hypokinesis worse in septum. 5. Global right ventricle has normal systolic function.The right ventricular size is normal. No increase in right ventricular wall thickness. 6. Left atrial size was moderately dilated. 7. Right atrial size was normal. 8. The mitral valve is normal in structure. Mild mitral valve regurgitation. 9. The tricuspid valve is normal in structure. Tricuspid valve regurgitation mild-moderate. 10. The aortic valve is tricuspid. Aortic valve regurgitation is not visualized. Mild aortic valve sclerosis without stenosis. 11. The pulmonic valve was grossly normal. Pulmonic valve regurgitation is mild. 12. Moderately elevated pulmonary artery systolic pressure.  Patient Profile     63 y.o. 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  Assessment & Plan    # Acute on chronic systolic and diastolic heart failure: LVEF this admission 35-40%, up from 25-30% 01/2019.  Prior to that it was 40-45% 07/2018.  She remains volume overloaded but much better.  She had 1L  off with HD yesterday.  Diuretics per nephrology.  She is still making urine. Volume can be managed with HD. Continue carvedilol, hydralazine and Imdur.  Given her reduced systolic function that she is on HD now, consider adding  ARB and getting her off amlodipine if OK with nephrology.  # Essential hypertension: Continue amlodipine, carvedilol, Imdur, and hydralazine.  Consider switching amlodipine to ARB as above.   # Acute on chronic renal failure: Started HD 11/25.    # PAF: She is maintaining sinus rhythm.  Coumadin on hold.  She is currently on heparin but she has active oozing from her catheter site.  OK to hold.  Continue carvedilol.   # CAD s/p PCI: LAD PCI in 2019.  She isn't on aspirin given clopidogrel and warfarin.  Continue atorvastatin.    # Prior CVA:  OK to hold clopidogrel 2/2 bleeding.  Resume when able.   For questions or updates, please contact [place] Please consult [email or web address] for contact info under        Signed, Skeet Latch, MD  07/06/2019, 9:34 AM

## 2019-07-06 NOTE — Progress Notes (Signed)
Pt returned from dialysis. Dressing that was put in place to reinforce HD site has been saturated with new blood. Blood is still oozing on site, more dressing and pressure applied. Will monitor. CBC ordered for tomorrow.   BP (!) 159/65 (BP Location: Left Arm)   Pulse 73   Temp 99.6 F (37.6 C) (Oral)   Resp 18   Ht 5\' 7"  (1.702 m)   Wt 80.2 kg   SpO2 98%   BMI 27.69 kg/m

## 2019-07-06 NOTE — Progress Notes (Addendum)
[facility] Progress Note  Name: Amber Young MRN: [id number] DOB: 1955/08/21  Chief Complaint:  Shortness of breath   Subjective:  Had bleeding from HD dressing this AM - soaked several gauze dressings and continues to bleed.  Had nontunneled catheter placed with IR on 11/25.  Had first HD on 11/25 with 1 kg UF.   Review of systems:  Denies any shortness of breath  No n/v No chest pain  ---------- Background on consult:  Pt is a 44F with a PMH sig for HTN, HLD, Afib, CHF with EF 25-30%, CKD with baseline Cr around 3 who is now seen in consultation at the request of Dr. Harl Bowie for evaluation and recommendations surrounding AKI on CKD IV. Typically follows with Dr. Posey Pronto.  She was recently admitted 11/5-11/8 with AKI on CKD and CHF exac.  D/c Cr was 4.5 at that time, usually baseline is around 3.5.  Losartan/ Lasix/ metolazone held at d/c.  Presented back to CHF clinic 11/19 with increased LE edema, weights up, and DOE.  She was admitted.  Started on IV Lasix 80 TID.  Has been making about 2L urine daily.  Weights have been slowly going down but are only down about 4lbs.  Metolazone has been held today.    Cr 4.3 on admission, is 4.5 today.  In this setting we are asked to see.  Pt reports her legs feel tight and uncomfortable.  BP is high.  On a nitro gtt now.  She denies CP.  No HA/ blurry vision.      Intake/Output Summary (Last 24 hours) at 07/06/2019 0912 Last data filed at 07/06/2019 0500 Gross per 24 hour  Intake 1052.9 ml  Output 2000 ml  Net -947.1 ml    Vitals:  Vitals:   07/06/19 0600 07/06/19 0717 07/06/19 0840 07/06/19 0908  BP:  (!) 163/68 (!) 160/54 (!) 156/57  Pulse:  64 66 (!) 57  Resp:   20   Temp:   97.8 F (36.6 C) 97.6 F (36.4 C)  TempSrc:   Oral Oral  SpO2:   97% 95%  Weight: 78.3 kg     Height:         Physical Exam:  General adult female seated in no acute distress  HEENT normocephalic atraumatic extraocular movements intact  sclera anicteric Neck supple trachea midline Lungs reduced breath sounds and basilar crackles; normal work of breathing at rest  Heart regular rate and rhythm no rubs or gallops appreciated Abdomen soft nontender nondistended Extremities 2-3+ edema  Psych normal mood and affect Neuro - alert and oriented x 3; follows commands and provides hx  Medications reviewed   Labs:  BMP Latest Ref Rng & Units 07/06/2019 07/05/2019 07/04/2019  Glucose 70 - 99 mg/dL 116(H) 109(H) 102(H)  BUN 8 - 23 mg/dL 57(H) 91(H) 83(H)  Creatinine 0.44 - 1.00 mg/dL 4.45(H) 6.06(H) 5.74(H)  BUN/Creat Ratio 12 - 28 - - -  Sodium 135 - 145 mmol/L 134(L) 132(L) 136  Potassium 3.5 - 5.1 mmol/L 3.5 3.6 4.6  Chloride 98 - 111 mmol/L 95(L) 93(L) 96(L)  CO2 22 - 32 mmol/L 26 25 28   Calcium 8.9 - 10.3 mg/dL 8.4(L) 8.9 9.2     Assessment/Plan:   1.  AKI on CKD IV which has progressed to ESRD:  Multiple recent insults; baseline 3.5 in June and now with recent admission for DKA where d/c Cr was 4.6 11/8.  Now with difficulty getting fluid off. Discussed with pt-- she follows with  Dr Posey Pronto and they have discussed HD.  Had nontunneled catheter placed with IR on 11/25.  Had first HD on 11/25 with 1 kg UF  - Plan for next HD on 11/26 and 11/27  - Discontinue the BID lasix for now - will plan on lasix on non-HD days and just had her AM dose - ordered vein mapping and will need vascular consult  - Will need CLIP - discussed with dialysis coordinator and this will need to be set up early next week - Discontinued plavix for now - discussed with cardiology and they are ok with stopping - Have contacted IR and they are coming to assess   2.  Acute on chronic CHF: EF 25-30%.  Cardiology following. Optimize volume with HD   3.  DM II: recent admission for DKA  4.  Afib: heparin gtt per pharmacy   5. HTN - with overload - diurese as above and follow with HD    6. Anemia of CKD - iron stores acceptable.  aranesp 40 mcg on  11/23.  Acute blood loss with oozing from dialysis catheter site.  Cardiology ok with pausing heparin gtt if needed for hemostasis then resuming as soon as able. Repeat CbC today at noon   Claudia Desanctis, MD 07/06/2019 9:12 AM

## 2019-07-06 NOTE — Progress Notes (Signed)
Patient ID: Amber Young, female   DOB: 1956/03/04, 63 y.o.   MRN: [id number]  Asked to evaluate patient's right chest wall hemodialysis catheter for bleeding at insertion site.  Catheter was placed yesterday.  On exam there is blood soaked gauze over the catheter with some clotting as well.  Gauze dressing removed and area cleansed.  A quick clot adhesive was placed over the catheter insertion site and new dressing applied.  Patient currently off IV heparin which should help minimize bleeding.  Recommend keeping patient upright for the next 3 to 4 hours as well.  Nurse aware.

## 2019-07-06 NOTE — Progress Notes (Addendum)
Observed oozing from HD site during hand-off. Upon assessment at 0717, four 2x2 gauze dressings saturated with sangineous blood. Patient orientation and VS assessed, patient A&Ox4, patient report drowsiness due to being up during the night for HD. Patient dressing changed (four 4x4 gauze applied with surgical tape). Patient report 10/10 pain at site with palpation and movement of neck. PRN Tylenol administered.   Stopped heparin gtt per neph MD foster given bleeding at HD site at 0944. Foster observed site; dressing now more saturated/ oozing and IR consulted. Increased oozing with sitting upright.    Approx 1015; MD Oval Linsey removed patient Ted hose during rounds and suggested elevate legs (patient in recliner and RN then elevated feet).   Lab notifed RN that husband concerned about drawing labs. Spoke with husband at 1152 about heparin and Hgb lab draw significance given bleedng at HD site. Patient husband assisted NT with bathing patient.   1220 IR placed Quikclot hemostatic dressing at HD cath site and covered with four 4x4 gauze. Old dressing removed; approx two 2x2 and two 4x4 dressings saturated. IR advised for patient to remain sitting upright; patient OOB in chair since shift change.   Notified by pharm at 1334, informed no longer on heparin gtt and will need to be reconsulted if restarted.   Small amount of drainage in right lower corner of dressing on neck at 1429. Will continue to monitor.   1749 four 4x4 dressing on right neck saturated; quickclot still in place and moderately saturated. Dressing replaced with four 4x4 dressing.   During hand-off with night RN, informed to continue to assess bleeding at cath site and to leave quickclot dressing in place. Informed to contact IR/night provider if need new dressing and/or increased drainage.

## 2019-07-06 NOTE — Progress Notes (Signed)
ANTICOAGULATION CONSULT NOTE  Pharmacy Consult:  Heparin Indication: atrial fibrillation (CHADS2VASc = 7),  Patient Measurements: Height: 5\' 7"  (170.2 cm) Weight: 176 lb 12.9 oz (80.2 kg) IBW/kg (Calculated) : 61.6 Heparin Dosing Weight: 76.8 kg   Vital Signs: Temp: 98.5 F (36.9 C) (11/26 0424) Temp Source: Oral (11/26 0424) BP: 161/63 (11/26 0424) Pulse Rate: 71 (11/26 0424)  Labs: Recent Labs    07/04/19 0426 07/04/19 1745 07/05/19 0821 07/05/19 1358 07/06/19 0335  HGB 8.7*  --  8.2*  --  7.6*  HCT 27.2*  --  25.5*  --  23.2*  PLT 304  --  282  --  246  LABPROT 29.4* 26.7* 26.0* 20.5*  --   INR 2.8* 2.5* 2.4* 1.8*  --   HEPARINUNFRC  --   --   --   --  0.39  CREATININE 5.74*  --  6.06*  --  4.45*    Estimated Creatinine Clearance: 14.1 mL/min (A) (by C-G formula based on SCr of 4.45 mg/dL (H)).   Assessment: 63 yo female presented on 06/29/2019 with SOB and CHF exacerbation. Patient has a history of Afib and on Coumadin PTA. Pharmacy originally consulted bridge IV heparin to Coumadin given sub-therapeutic INR on admit. INR became therapeutic on 11/22 but patient needs an HD line placed so new consult for holding warfarin and restarting heparin infusion when INR is  < 2.5  Initial heparin level 0.39 units/ml Goal of Therapy:  Heparin level goal 0.3-0.7 INR 2-3 Monitor platelets by anticoagulation protocol: Yes   Plan:   Cont  heparin at 1100 units/hr Check heparin level in ~ 6 hours to confirm Daily CBC and heparin level  Thanks for allowing pharmacy to be a part of this patient's care.  Excell Seltzer, PharmD Clinical Pharmacist

## 2019-07-07 ENCOUNTER — Inpatient Hospital Stay (HOSPITAL_COMMUNITY): Payer: Medicare Other

## 2019-07-07 DIAGNOSIS — I2583 Coronary atherosclerosis due to lipid rich plaque: Secondary | ICD-10-CM

## 2019-07-07 DIAGNOSIS — N186 End stage renal disease: Secondary | ICD-10-CM

## 2019-07-07 DIAGNOSIS — I4811 Longstanding persistent atrial fibrillation: Secondary | ICD-10-CM

## 2019-07-07 DIAGNOSIS — N184 Chronic kidney disease, stage 4 (severe): Secondary | ICD-10-CM

## 2019-07-07 DIAGNOSIS — I5042 Chronic combined systolic (congestive) and diastolic (congestive) heart failure: Secondary | ICD-10-CM

## 2019-07-07 LAB — GLUCOSE, CAPILLARY
Glucose-Capillary: 120 mg/dL — ABNORMAL HIGH (ref 70–99)
Glucose-Capillary: 145 mg/dL — ABNORMAL HIGH (ref 70–99)
Glucose-Capillary: 165 mg/dL — ABNORMAL HIGH (ref 70–99)
Glucose-Capillary: 256 mg/dL — ABNORMAL HIGH (ref 70–99)
Glucose-Capillary: 94 mg/dL (ref 70–99)

## 2019-07-07 LAB — PREPARE FRESH FROZEN PLASMA: Unit division: 0

## 2019-07-07 LAB — RENAL FUNCTION PANEL
Albumin: 2.9 g/dL — ABNORMAL LOW (ref 3.5–5.0)
Anion gap: 14 (ref 5–15)
BUN: 66 mg/dL — ABNORMAL HIGH (ref 8–23)
CO2: 26 mmol/L (ref 22–32)
Calcium: 8.9 mg/dL (ref 8.9–10.3)
Chloride: 95 mmol/L — ABNORMAL LOW (ref 98–111)
Creatinine, Ser: 4.66 mg/dL — ABNORMAL HIGH (ref 0.44–1.00)
GFR calc Af Amer: 11 mL/min — ABNORMAL LOW (ref >60)
GFR calc non Af Amer: 9 mL/min — ABNORMAL LOW (ref >60)
Glucose, Bld: 93 mg/dL (ref 70–99)
Phosphorus: 4.8 mg/dL — ABNORMAL HIGH (ref 2.5–4.6)
Potassium: 3.3 mmol/L — ABNORMAL LOW (ref 3.5–5.1)
Sodium: 135 mmol/L (ref 135–145)

## 2019-07-07 LAB — BPAM FFP
Blood Product Expiration Date: 202011252359
Blood Product Expiration Date: 202011292359
ISSUE DATE / TIME: 202011251118
Unit Type and Rh: 6200
Unit Type and Rh: 6200

## 2019-07-07 LAB — CBC
HCT: 23.7 % — ABNORMAL LOW (ref 36.0–46.0)
Hemoglobin: 7.5 g/dL — ABNORMAL LOW (ref 12.0–15.0)
MCH: 23.9 pg — ABNORMAL LOW (ref 26.0–34.0)
MCHC: 31.6 g/dL (ref 30.0–36.0)
MCV: 75.5 fL — ABNORMAL LOW (ref 80.0–100.0)
Platelets: 248 10*3/uL (ref 150–400)
RBC: 3.14 MIL/uL — ABNORMAL LOW (ref 3.87–5.11)
RDW: 16.1 % — ABNORMAL HIGH (ref 11.5–15.5)
WBC: 5.4 10*3/uL (ref 4.0–10.5)
nRBC: 0 % (ref 0.0–0.2)

## 2019-07-07 LAB — HEPARIN LEVEL (UNFRACTIONATED): Heparin Unfractionated: 0.34 IU/mL (ref 0.30–0.70)

## 2019-07-07 LAB — PROTIME-INR
INR: 1.3 — ABNORMAL HIGH (ref 0.8–1.2)
Prothrombin Time: 16.3 seconds — ABNORMAL HIGH (ref 11.4–15.2)

## 2019-07-07 MED ORDER — POTASSIUM CHLORIDE CRYS ER 20 MEQ PO TBCR
20.0000 meq | EXTENDED_RELEASE_TABLET | Freq: Once | ORAL | Status: AC
  Administered 2019-07-07: 20 meq via ORAL
  Filled 2019-07-07: qty 1

## 2019-07-07 MED ORDER — VANCOMYCIN HCL IN DEXTROSE 1-5 GM/200ML-% IV SOLN
INTRAVENOUS | Status: AC
  Filled 2019-07-07: qty 200

## 2019-07-07 MED ORDER — HEPARIN SODIUM (PORCINE) 1000 UNIT/ML IJ SOLN
INTRAMUSCULAR | Status: AC
  Administered 2019-07-07: 2600 [IU] via INTRAVENOUS
  Filled 2019-07-07: qty 4

## 2019-07-07 MED ORDER — HEPARIN (PORCINE) 25000 UT/250ML-% IV SOLN
950.0000 [IU]/h | INTRAVENOUS | Status: DC
  Administered 2019-07-07 – 2019-07-09 (×3): 1000 [IU]/h via INTRAVENOUS
  Filled 2019-07-07 (×3): qty 250

## 2019-07-07 MED ORDER — CHLORHEXIDINE GLUCONATE CLOTH 2 % EX PADS: 6.0000 | MEDICATED_PAD | Freq: Every day | CUTANEOUS | Status: DC

## 2019-07-07 NOTE — H&P (View-Only) (Signed)
[facility] NOTE   MRN : [id number]  Reason for Consult: ESRD Referring Physician: Dr. Royce Macadamia  History of Present Illness: 63 y/o female with CKD admitted with generalized weakness and B LE edema.  A Temp HD catheter was placed and she started HD 11/25.  We have been asked to provide [facility] with permanent access.   PMH sig for HTN, HLD, CAD with history of stent,  Afib, CHF with EF 25-30% and A fib managed with Coumadin Currently on hold and managed with Heparin.   Current Facility-Administered Medications  Medication Dose Route Frequency Provider Last Rate Last Dose  . 0.9 %  sodium chloride infusion   Intravenous PRN Skeet Latch, MD 10 mL/hr at 07/03/19 1720 250 mL at 07/03/19 1720  . acetaminophen (TYLENOL) tablet 650 mg  650 mg Oral Q4H PRN Barrett, Evelene Croon, PA-C   650 mg at 07/06/19 1305  . ALPRAZolam Duanne Moron) tablet 0.25 mg  0.25 mg Oral BID PRN Barrett, Rhonda G, PA-C      . amLODipine (NORVASC) tablet 10 mg  10 mg Oral Daily Barrett, Rhonda G, PA-C   10 mg at 07/06/19 0921  . atorvastatin (LIPITOR) tablet 40 mg  40 mg Oral q1800 Barrett, Rhonda G, PA-C   40 mg at 07/06/19 1730  . brimonidine (ALPHAGAN) 0.2 % ophthalmic solution 1 drop  1 drop Left Eye BID Barrett, Evelene Croon, PA-C   1 drop at 07/06/19 2109  . carvedilol (COREG) tablet 25 mg  25 mg Oral BID Barrett, Evelene Croon, PA-C   25 mg at 07/06/19 2242  . Chlorhexidine Gluconate Cloth 2 % PADS 6 each  6 each Topical Daily Skeet Latch, MD   6 each at 07/06/19 [phone]  . Chlorhexidine Gluconate Cloth 2 % PADS 6 each  6 each Topical Q0600 Claudia Desanctis, MD      . dorzolamide-timolol (COSOPT) 22.3-6.8 MG/ML ophthalmic solution 1 drop  1 drop Left Eye BID Barrett, Evelene Croon, PA-C   1 drop at 07/06/19 2111  . ferrous sulfate tablet 325 mg  325 mg Oral QODAY Barrett, Rhonda G, PA-C   325 mg at 07/06/19 [id number]  . heparin ADULT infusion 100 units/mL (25000 units/25mL sodium chloride 0.45%)  1,000  Units/hr Intravenous Continuous Hardie Lora, MD      . heparin injection   Intravenous PRN Aletta Edouard, MD   2.6 mL at 07/05/19 1644  . hydrALAZINE (APRESOLINE) tablet 100 mg  100 mg Oral TID Barrett, Rhonda G, PA-C   100 mg at 07/06/19 2242  . insulin aspart (novoLOG) injection 0-9 Units  0-9 Units Subcutaneous Q4H Isaiah Serge, NP   2 Units at 07/07/19 0049  . isosorbide mononitrate (IMDUR) 24 hr tablet 30 mg  30 mg Oral Daily Sueanne Margarita, MD   30 mg at 07/06/19 [id number]  . ketorolac (ACULAR) 0.5 % ophthalmic solution 1 drop  1 drop Right Eye QID Barrett, Evelene Croon, PA-C   1 drop at 07/06/19 2109  . lidocaine (XYLOCAINE) 1 % (with pres) injection   Infiltration PRN Aletta Edouard, MD   5 mL at 07/05/19 1643  . nitroGLYCERIN (NITROSTAT) SL tablet 0.4 mg  0.4 mg Sublingual Q5 Min x 3 PRN Barrett, Rhonda G, PA-C      . ofloxacin (OCUFLOX) 0.3 % ophthalmic solution 1 drop  1 drop Right Eye TID Barrett, Evelene Croon, PA-C   1 drop at 07/06/19 2107  . ondansetron (ZOFRAN) injection 4 mg  4 mg Intravenous Q6H PRN Barrett, Rhonda G, PA-C      . potassium chloride SA (KLOR-CON) CR tablet 20 mEq  20 mEq Oral Once Harrie Jeans C, MD      . zolpidem (AMBIEN) tablet 5 mg  5 mg Oral QHS PRN Barrett, Rhonda G, PA-C        Pt meds include: Statin :Yes Betablocker: Yes ASA: No Other anticoagulants/antiplatelets:   Past Medical History:  Diagnosis Date  . Anemia   . Atrial fibrillation ([place]) 06/17/2019  . Chronic combined systolic and diastolic CHF (congestive heart failure) ([place])    a. EF 40-45% in 2019 b. EF 25-30% by repeat echo in 01/2019  . CKD (chronic kidney disease), stage IV ([place])   . Coronary artery disease 08/2017   DES x 2 RCA  . DKA (diabetic ketoacidoses) ([place]) 06/16/2019  . GERD (gastroesophageal reflux disease)   . Heart murmur   . Hyperlipidemia   . Hypertension   . Proliferative diabetic retinopathy ([place])    left eye with vitreous hemorrhage and tractional retinal detachment   . Stroke ([place]) 09/2014   numbness left upper lip, finger tips on left hand; "resolved" (05/18/2016)  . Type II diabetes mellitus ([place])   . Wears glasses     Past Surgical History:  Procedure Laterality Date  . CARDIAC CATHETERIZATION N/A 05/18/2016   Procedure: Left Heart Cath and Coronary Angiography;  Surgeon: Jettie Booze, MD;  Location: [place] [facility];  Service: Cardiovascular;  Laterality: N/A;  . CARDIAC CATHETERIZATION N/A 05/18/2016   Procedure: Coronary Stent Intervention;  Surgeon: Jettie Booze, MD;  Location: [place] [facility];  Service: Cardiovascular;  Laterality: N/A;  . CATARACT EXTRACTION Right 2020  . COLONOSCOPY W/ BIOPSIES AND POLYPECTOMY    . CORONARY ANGIOPLASTY    . CORONARY ANGIOPLASTY WITH STENT PLACEMENT    . CORONARY STENT INTERVENTION N/A 09/07/2017   Procedure: CORONARY STENT INTERVENTION;  Surgeon: Leonie Man, MD;  Location: [place] [facility];  Service: Cardiovascular;  Laterality: N/A;  . EYE SURGERY    . GAS INSERTION Left 01/13/2018   Procedure: INSERTION OF GAS;  Surgeon: Bernarda Caffey, MD;  Location: [place];  Service: Ophthalmology;  Laterality: Left;  . IR FLUORO GUIDE CV LINE RIGHT  07/05/2019  . IR US GUIDE VASC ACCESS RIGHT  07/05/2019  . LEFT HEART CATH AND CORONARY ANGIOGRAPHY N/A 09/07/2017   Procedure: LEFT HEART CATH AND CORONARY ANGIOGRAPHY;  Surgeon: Leonie Man, MD;  Location: [place] [facility];  Service: Cardiovascular;  Laterality: N/A;  . MEMBRANE PEEL Left 01/13/2018   Procedure: MEMBRANE PEEL LEFT EYE ;  Surgeon: Bernarda Caffey, MD;  Location: [place];  Service: Ophthalmology;  Laterality: Left;  Marland Kitchen MULTIPLE TOOTH EXTRACTIONS    . PARS PLANA VITRECTOMY Left 01/13/2018   Procedure: PARS PLANA VITRECTOMY WITH 25 GAUGE LEFT EYE WITH ENDOLASER;  Surgeon: Bernarda Caffey, MD;  Location: [place];  Service: Ophthalmology;  Laterality: Left;  . TUBAL LIGATION  1980  . VAGINAL HYSTERECTOMY  1999   "partial; fibroids"    Social  History Social History   Tobacco Use  . Smoking status: Never Smoker  . Smokeless tobacco: Never Used  Substance Use Topics  . Alcohol use: No    Alcohol/week: 0.0 standard drinks  . Drug use: No    Family History Family History  Problem Relation Age of Onset  . Diabetes Mother   . Hypertension Mother   . COPD Mother   .  Heart failure Mother   . Hypertension Sister   . Hypertension Brother     Allergies  Allergen Reactions  . Ace Inhibitors Cough     REVIEW OF SYSTEMS  General: [ ]  Weight loss, [ ]  Fever, [ ]  chills Neurologic: [ ]  Dizziness, [ ]  Blackouts, [ ]  Seizure [ ]  Stroke, [ ]  "Mini stroke", [ ]  Slurred speech, [ ]  Temporary blindness; [ ]  weakness in arms or legs, [ ]  Hoarseness [ ]  Dysphagia Cardiac: [ ]  Chest pain/pressure, [ ]  Shortness of breath at rest [ ]  Shortness of breath with exertion, [ ]  Atrial fibrillation or irregular heartbeat  Vascular: [ ]  Pain in legs with walking, [ ]  Pain in legs at rest, [ ]  Pain in legs at night,  [ ]  Non-healing ulcer, [ ]  Blood clot in vein/DVT,   Pulmonary: [ ]  Home oxygen, [ ]  Productive cough, [ ]  Coughing up blood, [ ]  Asthma,  [ ]  Wheezing [ ]  COPD Musculoskeletal:  [ ]  Arthritis, [ ]  Low back pain, [ ]  Joint pain Hematologic: [ ]  Easy Bruising, [ ]  Anemia; [ ]  Hepatitis Gastrointestinal: [ ]  Blood in stool, [ ]  Gastroesophageal Reflux/heartburn, Urinary: [ ]  chronic Kidney disease, [x ] on HD - [ ]  MWF or [ ]  TTHS, [ ]  Burning with urination, [ ]  Difficulty urinating Skin: [ ]  Rashes, [ ]  Wounds Psychological: [ ]  Anxiety, [ ]  Depression  Physical Examination Vitals:   07/07/19 0703 07/07/19 0704 07/07/19 0730 07/07/19 0800  BP: (!) 177/81 (!) 178/84 (!) 162/66 (!) 169/75  Pulse: 63 71 71 69  Resp:      Temp:      TempSrc:      SpO2:      Weight:      Height:       Body mass index is 27.04 kg/m.  General:  WDWN in NAD Gait: Normal HENT: WNL Eyes: Pupils equal Pulmonary: normal non-labored  breathing , without Rales, rhonchi,  wheezing Cardiac: RRR, without  Murmurs, rubs or gallops; No carotid bruits Abdomen: soft, NT, no masses Skin: no rashes, ulcers noted;  no Gangrene , no cellulitis; no open wounds;   Vascular Exam/Pulses:Palpable radial and brachial pulses B UE. Right IJ temp catheter   Musculoskeletal: no muscle wasting or atrophy; B LE edema  Neurologic: A&O X 3; Appropriate Affect ;  SENSATION: normal; MOTOR FUNCTION: 5/5 Symmetric Speech is fluent/normal   Significant Diagnostic Studies: CBC Lab Results  Component Value Date   WBC 5.4 07/07/2019   HGB 7.5 (L) 07/07/2019   HCT 23.7 (L) 07/07/2019   MCV 75.5 (L) 07/07/2019   PLT 248 07/07/2019    BMET    Component Value Date/Time   NA 135 07/07/2019 0356   NA 140 04/25/2019 1202   K 3.3 (L) 07/07/2019 0356   CL 95 (L) 07/07/2019 0356   CO2 26 07/07/2019 0356   GLUCOSE 93 07/07/2019 0356   BUN 66 (H) 07/07/2019 0356   BUN 71 (H) 04/25/2019 1202   CREATININE 4.66 (H) 07/07/2019 0356   CREATININE 1.87 (H) 05/27/2018 1221   CALCIUM 8.9 07/07/2019 0356   GFRNONAA 9 (L) 07/07/2019 0356   GFRNONAA 31 (L) 12/13/2017 1049   GFRNONAA 43 (L) 07/09/2017 1047   GFRAA 11 (L) 07/07/2019 0356   GFRAA 36 (L) 12/13/2017 1049   GFRAA 50 (L) 07/09/2017 1047   Estimated Creatinine Clearance: 13.3 mL/min (A) (by C-G formula based on SCr of 4.66 mg/dL (H)).  COAG  Lab Results  Component Value Date   INR 1.3 (H) 07/07/2019   INR 1.5 (H) 07/06/2019   INR 1.8 (H) 07/05/2019     Non-Invasive Vascular Imaging:  Pending vein mapping  ASSESSMENT/PLAN:  ESRD AKI on CKE with exacerbated CHF and B LE edema causing generalized  Weakness.  On HD right temp cath.  Coumadin on hold and now on Heparin.    She is right hand dominant.  Pending vein mapping we will schedule her for left UE AV fistula verses graft and TDC placement Monday with Dr. Scot Dock.     Roxy Horseman 07/07/2019 8:46 AM I have examined  the patient, reviewed and agree with above.  Small surface veins bilaterally.  Easily palpable radial pulses bilaterally.  Discussed need for transition to tunneled hemodialysis catheter and left arm access.  She is right-hand dominant.  Will restrict left extremity.  Vein map pending  Curt Jews, MD 07/07/2019 12:59 PM

## 2019-07-07 NOTE — Progress Notes (Signed)
[facility] Progress Note  Name: Amber Young MRN: [id number] DOB: August 31, 1955  Chief Complaint:  Shortness of breath   Subjective:  Bleeding had resolved on 11/26 PM reassessment and hasn't been a problem overnight per patient.  Had 600 mL UOP over 11/26 charted.  Had first HD on 11/25 with 1 kg UF.  Seen on HD at 8:00 on 11/27 and procedure supervised.  RIJ nontunneled catheter and BP 168/83 and HR 71.  Review of systems:   Denies any shortness of breath  No n/v No chest pain  ---------- Background on consult:  Pt is a 74F with a PMH sig for HTN, HLD, Afib, CHF with EF 25-30%, CKD with baseline Cr around 3 who is now seen in consultation at the request of Dr. Harl Bowie for evaluation and recommendations surrounding AKI on CKD IV. Typically follows with Dr. Posey Pronto.  She was recently admitted 11/5-11/8 with AKI on CKD and CHF exac.  D/c Cr was 4.5 at that time, usually baseline is around 3.5.  Losartan/ Lasix/ metolazone held at d/c.  Presented back to CHF clinic 11/19 with increased LE edema, weights up, and DOE.  She was admitted.  Started on IV Lasix 80 TID.  Has been making about 2L urine daily.  Weights have been slowly going down but are only down about 4lbs.  Metolazone has been held today.    Cr 4.3 on admission, is 4.5 today.  In this setting we are asked to see.  Pt reports her legs feel tight and uncomfortable.  BP is high.  On a nitro gtt now.  She denies CP.  No HA/ blurry vision.      Intake/Output Summary (Last 24 hours) at 07/07/2019 0751 Last data filed at 07/06/2019 2100 Gross per 24 hour  Intake 640 ml  Output 600 ml  Net 40 ml    Vitals:  Vitals:   07/06/19 1200 07/06/19 1616 07/06/19 2057 07/07/19 0430  BP: (!) 156/61 (!) 152/60 (!) 175/71 (!) 157/65  Pulse: 62 (!) 59 63 (!) 55  Resp:  20 18 18   Temp: 97.8 F (36.6 C) 97.8 F (36.6 C) 98.7 F (37.1 C) 98.3 F (36.8 C)  TempSrc: Oral Oral Oral Oral  SpO2: 99% 100% 96% 100%  Weight:       Height:         Physical Exam:  General adult female seated in no acute distress   HEENT normocephalic atraumatic extraocular movements intact sclera anicteric Neck supple trachea midline Lungs clear on auscultation; normal work of breathing at rest  Heart regular rate and rhythm no rubs or gallops appreciated Abdomen soft nontender nondistended Extremities 2+ edema lower extremities Psych normal mood and affect Neuro - alert and oriented x 3; follows commands and provides hx Access right IJ nontunneled catheter in place - dressing is clean   Medications reviewed   Labs:  BMP Latest Ref Rng & Units 07/07/2019 07/06/2019 07/05/2019  Glucose 70 - 99 mg/dL 93 116(H) 109(H)  BUN 8 - 23 mg/dL 66(H) 57(H) 91(H)  Creatinine 0.44 - 1.00 mg/dL 4.66(H) 4.45(H) 6.06(H)  BUN/Creat Ratio 12 - 28 - - -  Sodium 135 - 145 mmol/L 135 134(L) 132(L)  Potassium 3.5 - 5.1 mmol/L 3.3(L) 3.5 3.6  Chloride 98 - 111 mmol/L 95(L) 95(L) 93(L)  CO2 22 - 32 mmol/L 26 26 25   Calcium 8.9 - 10.3 mg/dL 8.9 8.4(L) 8.9     Assessment/Plan:   1.  AKI on CKD IV which has  progressed to ESRD:  Multiple recent insults; baseline 3.5 in June and now with recent admission for DKA where d/c Cr was 4.6 11/8.  Now with difficulty getting fluid off. Discussed with pt-- she follows with Dr Posey Pronto and they have discussed HD.  Had nontunneled catheter placed with IR on 11/25.  Had first HD on 11/25 with 1 kg UF  - HD on 11/26 and 11/27  - Hold lasix on HD days for now - ordered vein mapping and will need vascular consult for fistula  - Will need CLIP - discussed with dialysis coordinator and this will need to be set up early next week - Discontinued plavix for now   2.  Acute on chronic CHF: EF 25-30%.  Cardiology following. Optimize volume with HD   3.  DM II: recent admission for DKA  4.  Afib: heparin gtt was held for bleeding - consulted pharmacy for reinitiation.  Please remain off of coumadin for access  placement with vascular    5. HTN - with overload - diurese as above and follow with HD    6. Anemia of CKD - iron stores acceptable.  aranesp 40 mcg on 11/23 - will need dose increase for next dose.  Acute blood loss with oozing from dialysis catheter site.  Resume heparin today - consulted pharmacy    Claudia Desanctis, MD 07/07/2019 7:51 AM

## 2019-07-07 NOTE — Progress Notes (Addendum)
ANTICOAGULATION CONSULT NOTE  Pharmacy Consult:  Heparin Indication: atrial fibrillation (CHADS2VASc = 7),  Patient Measurements: Height: 5\' 7"  (170.2 cm) Weight: 172 lb 9.6 oz (78.3 kg) IBW/kg (Calculated) : 61.6 Heparin Dosing Weight: 76.8 kg   Vital Signs: Temp: 98.3 F (36.8 C) (11/27 0430) Temp Source: Oral (11/27 0430) BP: 157/65 (11/27 0430) Pulse Rate: 55 (11/27 0430)  Labs: Recent Labs    07/05/19 0821 07/05/19 1358 07/06/19 0335 07/06/19 1156 07/07/19 0356  HGB 8.2*  --  7.6* 7.8* 7.5*  HCT 25.5*  --  23.2* 24.2* 23.7*  PLT 282  --  246 242 248  LABPROT 26.0* 20.5* 17.6*  --  16.3*  INR 2.4* 1.8* 1.5*  --  1.3*  HEPARINUNFRC  --   --  0.39 0.11*  --   CREATININE 6.06*  --  4.45*  --  4.66*    Estimated Creatinine Clearance: 13.3 mL/min (A) (by C-G formula based on SCr of 4.66 mg/dL (H)).   Assessment: 63 yo female on warfarin at home for hx Afib (CHADS2VASc = 7). Pharmacy originally consulted bridge IV heparin to Coumadin given sub-therapeutic INR on admit and was at goal on heparin 950 units/hr. All anticoagulation held 11/22 and warfarin reversed with vitamin K and FFP 11/25 for urgent HD line placement 11/25. Heparin was restarted after this with HL at goal within 6 hr but patient had significant oozing from line site so heparin was discontinued again on 11/26.   Patient is no longer oozing today so pharmacy was re-consulted for heparin. Warfarin continues to be on hold pending permanent HD access. Will target lower end of goal range given recent bleeding and line placement.   Home warfarin 5mg  daily except 7.5mg  on MWF   Goal of Therapy:  Heparin level goal 0.3-0.7 INR 2-3 Monitor platelets by anticoagulation protocol: Yes   Plan:   Restart heparin at 1000 units/hr Target lower end of goal range  Check heparin level in ~ 8 hours  Monitor daily HL, CBC, plt Monitor for signs/symptoms of bleeding    Thanks for allowing pharmacy to be a part of  this patient's care.  Benetta Spar, PharmD, BCPS, BCCP Clinical Pharmacist  Please check AMION for all [place] phone numbers After 10:00 PM, call [place] [phone]

## 2019-07-07 NOTE — Consult Note (Addendum)
[facility] NOTE   MRN : [id number]  Reason for Consult: ESRD Referring Physician: Dr. Royce Macadamia  History of Present Illness: 63 y/o female with CKD admitted with generalized weakness and B LE edema.  A Temp HD catheter was placed and she started HD 11/25.  We have been asked to provide [facility] with permanent access.   PMH sig for HTN, HLD, CAD with history of stent,  Afib, CHF with EF 25-30% and A fib managed with Coumadin Currently on hold and managed with Heparin.   Current Facility-Administered Medications  Medication Dose Route Frequency Provider Last Rate Last Dose  . 0.9 %  sodium chloride infusion   Intravenous PRN Skeet Latch, MD 10 mL/hr at 07/03/19 1720 250 mL at 07/03/19 1720  . acetaminophen (TYLENOL) tablet 650 mg  650 mg Oral Q4H PRN Barrett, Evelene Croon, PA-C   650 mg at 07/06/19 1305  . ALPRAZolam Duanne Moron) tablet 0.25 mg  0.25 mg Oral BID PRN Barrett, Rhonda G, PA-C      . amLODipine (NORVASC) tablet 10 mg  10 mg Oral Daily Barrett, Rhonda G, PA-C   10 mg at 07/06/19 0921  . atorvastatin (LIPITOR) tablet 40 mg  40 mg Oral q1800 Barrett, Rhonda G, PA-C   40 mg at 07/06/19 1730  . brimonidine (ALPHAGAN) 0.2 % ophthalmic solution 1 drop  1 drop Left Eye BID Barrett, Evelene Croon, PA-C   1 drop at 07/06/19 2109  . carvedilol (COREG) tablet 25 mg  25 mg Oral BID Barrett, Evelene Croon, PA-C   25 mg at 07/06/19 2242  . Chlorhexidine Gluconate Cloth 2 % PADS 6 each  6 each Topical Daily Skeet Latch, MD   6 each at 07/06/19 [phone]  . Chlorhexidine Gluconate Cloth 2 % PADS 6 each  6 each Topical Q0600 Claudia Desanctis, MD      . dorzolamide-timolol (COSOPT) 22.3-6.8 MG/ML ophthalmic solution 1 drop  1 drop Left Eye BID Barrett, Evelene Croon, PA-C   1 drop at 07/06/19 2111  . ferrous sulfate tablet 325 mg  325 mg Oral QODAY Barrett, Rhonda G, PA-C   325 mg at 07/06/19 [id number]  . heparin ADULT infusion 100 units/mL (25000 units/244mL sodium chloride 0.45%)  1,000  Units/hr Intravenous Continuous Hardie Lora, MD      . heparin injection   Intravenous PRN Aletta Edouard, MD   2.6 mL at 07/05/19 1644  . hydrALAZINE (APRESOLINE) tablet 100 mg  100 mg Oral TID Barrett, Rhonda G, PA-C   100 mg at 07/06/19 2242  . insulin aspart (novoLOG) injection 0-9 Units  0-9 Units Subcutaneous Q4H Isaiah Serge, NP   2 Units at 07/07/19 0049  . isosorbide mononitrate (IMDUR) 24 hr tablet 30 mg  30 mg Oral Daily Sueanne Margarita, MD   30 mg at 07/06/19 [id number]  . ketorolac (ACULAR) 0.5 % ophthalmic solution 1 drop  1 drop Right Eye QID Barrett, Evelene Croon, PA-C   1 drop at 07/06/19 2109  . lidocaine (XYLOCAINE) 1 % (with pres) injection   Infiltration PRN Aletta Edouard, MD   5 mL at 07/05/19 1643  . nitroGLYCERIN (NITROSTAT) SL tablet 0.4 mg  0.4 mg Sublingual Q5 Min x 3 PRN Barrett, Rhonda G, PA-C      . ofloxacin (OCUFLOX) 0.3 % ophthalmic solution 1 drop  1 drop Right Eye TID Barrett, Evelene Croon, PA-C   1 drop at 07/06/19 2107  . ondansetron (ZOFRAN) injection 4 mg  4 mg Intravenous Q6H PRN Barrett, Rhonda G, PA-C      . potassium chloride SA (KLOR-CON) CR tablet 20 mEq  20 mEq Oral Once Harrie Jeans C, MD      . zolpidem (AMBIEN) tablet 5 mg  5 mg Oral QHS PRN Barrett, Rhonda G, PA-C        Pt meds include: Statin :Yes Betablocker: Yes ASA: No Other anticoagulants/antiplatelets:   Past Medical History:  Diagnosis Date  . Anemia   . Atrial fibrillation ([place]) 06/17/2019  . Chronic combined systolic and diastolic CHF (congestive heart failure) ([place])    a. EF 40-45% in 2019 b. EF 25-30% by repeat echo in 01/2019  . CKD (chronic kidney disease), stage IV ([place])   . Coronary artery disease 08/2017   DES x 2 RCA  . DKA (diabetic ketoacidoses) ([place]) 06/16/2019  . GERD (gastroesophageal reflux disease)   . Heart murmur   . Hyperlipidemia   . Hypertension   . Proliferative diabetic retinopathy ([place])    left eye with vitreous hemorrhage and tractional retinal detachment   . Stroke ([place]) 09/2014   numbness left upper lip, finger tips on left hand; "resolved" (05/18/2016)  . Type II diabetes mellitus ([place])   . Wears glasses     Past Surgical History:  Procedure Laterality Date  . CARDIAC CATHETERIZATION N/A 05/18/2016   Procedure: Left Heart Cath and Coronary Angiography;  Surgeon: Jettie Booze, MD;  Location: [place] CV [facility];  Service: Cardiovascular;  Laterality: N/A;  . CARDIAC CATHETERIZATION N/A 05/18/2016   Procedure: Coronary Stent Intervention;  Surgeon: Jettie Booze, MD;  Location: [place] [facility];  Service: Cardiovascular;  Laterality: N/A;  . CATARACT EXTRACTION Right 2020  . COLONOSCOPY W/ BIOPSIES AND POLYPECTOMY    . CORONARY ANGIOPLASTY    . CORONARY ANGIOPLASTY WITH STENT PLACEMENT    . CORONARY STENT INTERVENTION N/A 09/07/2017   Procedure: CORONARY STENT INTERVENTION;  Surgeon: Leonie Man, MD;  Location: [place] [facility];  Service: Cardiovascular;  Laterality: N/A;  . EYE SURGERY    . GAS INSERTION Left 01/13/2018   Procedure: INSERTION OF GAS;  Surgeon: Bernarda Caffey, MD;  Location: [place];  Service: Ophthalmology;  Laterality: Left;  . IR FLUORO GUIDE CV LINE RIGHT  07/05/2019  . IR US GUIDE VASC ACCESS RIGHT  07/05/2019  . LEFT HEART CATH AND CORONARY ANGIOGRAPHY N/A 09/07/2017   Procedure: LEFT HEART CATH AND CORONARY ANGIOGRAPHY;  Surgeon: Leonie Man, MD;  Location: [place] [facility];  Service: Cardiovascular;  Laterality: N/A;  . MEMBRANE PEEL Left 01/13/2018   Procedure: MEMBRANE PEEL LEFT EYE ;  Surgeon: Bernarda Caffey, MD;  Location: [place];  Service: Ophthalmology;  Laterality: Left;  Marland Kitchen MULTIPLE TOOTH EXTRACTIONS    . PARS PLANA VITRECTOMY Left 01/13/2018   Procedure: PARS PLANA VITRECTOMY WITH 25 GAUGE LEFT EYE WITH ENDOLASER;  Surgeon: Bernarda Caffey, MD;  Location: [place];  Service: Ophthalmology;  Laterality: Left;  . TUBAL LIGATION  1980  . VAGINAL HYSTERECTOMY  1999   "partial; fibroids"    Social  History Social History   Tobacco Use  . Smoking status: Never Smoker  . Smokeless tobacco: Never Used  Substance Use Topics  . Alcohol use: No    Alcohol/week: 0.0 standard drinks  . Drug use: No    Family History Family History  Problem Relation Age of Onset  . Diabetes Mother   . Hypertension Mother   . COPD Mother   .  Heart failure Mother   . Hypertension Sister   . Hypertension Brother     Allergies  Allergen Reactions  . Ace Inhibitors Cough     REVIEW OF SYSTEMS  General: [ ]  Weight loss, [ ]  Fever, [ ]  chills Neurologic: [ ]  Dizziness, [ ]  Blackouts, [ ]  Seizure [ ]  Stroke, [ ]  "Mini stroke", [ ]  Slurred speech, [ ]  Temporary blindness; [ ]  weakness in arms or legs, [ ]  Hoarseness [ ]  Dysphagia Cardiac: [ ]  Chest pain/pressure, [ ]  Shortness of breath at rest [ ]  Shortness of breath with exertion, [ ]  Atrial fibrillation or irregular heartbeat  Vascular: [ ]  Pain in legs with walking, [ ]  Pain in legs at rest, [ ]  Pain in legs at night,  [ ]  Non-healing ulcer, [ ]  Blood clot in vein/DVT,   Pulmonary: [ ]  Home oxygen, [ ]  Productive cough, [ ]  Coughing up blood, [ ]  Asthma,  [ ]  Wheezing [ ]  COPD Musculoskeletal:  [ ]  Arthritis, [ ]  Low back pain, [ ]  Joint pain Hematologic: [ ]  Easy Bruising, [ ]  Anemia; [ ]  Hepatitis Gastrointestinal: [ ]  Blood in stool, [ ]  Gastroesophageal Reflux/heartburn, Urinary: [ ]  chronic Kidney disease, [x ] on HD - [ ]  MWF or [ ]  TTHS, [ ]  Burning with urination, [ ]  Difficulty urinating Skin: [ ]  Rashes, [ ]  Wounds Psychological: [ ]  Anxiety, [ ]  Depression  Physical Examination Vitals:   07/07/19 0703 07/07/19 0704 07/07/19 0730 07/07/19 0800  BP: (!) 177/81 (!) 178/84 (!) 162/66 (!) 169/75  Pulse: 63 71 71 69  Resp:      Temp:      TempSrc:      SpO2:      Weight:      Height:       Body mass index is 27.04 kg/m.  General:  WDWN in NAD Gait: Normal HENT: WNL Eyes: Pupils equal Pulmonary: normal non-labored  breathing , without Rales, rhonchi,  wheezing Cardiac: RRR, without  Murmurs, rubs or gallops; No carotid bruits Abdomen: soft, NT, no masses Skin: no rashes, ulcers noted;  no Gangrene , no cellulitis; no open wounds;   Vascular Exam/Pulses:Palpable radial and brachial pulses B UE. Right IJ temp catheter   Musculoskeletal: no muscle wasting or atrophy; B LE edema  Neurologic: A&O X 3; Appropriate Affect ;  SENSATION: normal; MOTOR FUNCTION: 5/5 Symmetric Speech is fluent/normal   Significant Diagnostic Studies: CBC Lab Results  Component Value Date   WBC 5.4 07/07/2019   HGB 7.5 (L) 07/07/2019   HCT 23.7 (L) 07/07/2019   MCV 75.5 (L) 07/07/2019   PLT 248 07/07/2019    BMET    Component Value Date/Time   NA 135 07/07/2019 0356   NA 140 04/25/2019 1202   K 3.3 (L) 07/07/2019 0356   CL 95 (L) 07/07/2019 0356   CO2 26 07/07/2019 0356   GLUCOSE 93 07/07/2019 0356   BUN 66 (H) 07/07/2019 0356   BUN 71 (H) 04/25/2019 1202   CREATININE 4.66 (H) 07/07/2019 0356   CREATININE 1.87 (H) 05/27/2018 1221   CALCIUM 8.9 07/07/2019 0356   GFRNONAA 9 (L) 07/07/2019 0356   GFRNONAA 31 (L) 12/13/2017 1049   GFRNONAA 43 (L) 07/09/2017 1047   GFRAA 11 (L) 07/07/2019 0356   GFRAA 36 (L) 12/13/2017 1049   GFRAA 50 (L) 07/09/2017 1047   Estimated Creatinine Clearance: 13.3 mL/min (A) (by C-G formula based on SCr of 4.66 mg/dL (H)).  COAG  Lab Results  Component Value Date   INR 1.3 (H) 07/07/2019   INR 1.5 (H) 07/06/2019   INR 1.8 (H) 07/05/2019     Non-Invasive Vascular Imaging:  Pending vein mapping  ASSESSMENT/PLAN:  ESRD AKI on CKE with exacerbated CHF and B LE edema causing generalized  Weakness.  On HD right temp cath.  Coumadin on hold and now on Heparin.    She is right hand dominant.  Pending vein mapping we will schedule her for left UE AV fistula verses graft and TDC placement Monday with Dr. Scot Dock.     Amber Young 07/07/2019 8:46 AM I have examined  the patient, reviewed and agree with above.  Small surface veins bilaterally.  Easily palpable radial pulses bilaterally.  Discussed need for transition to tunneled hemodialysis catheter and left arm access.  She is right-hand dominant.  Will restrict left extremity.  Vein map pending  Curt Jews, MD 07/07/2019 12:59 PM

## 2019-07-07 NOTE — Progress Notes (Signed)
Bilateral upper extremity vein mapping completed. Refer to "CV Proc" under chart review to view preliminary results.  07/07/2019 4:52 PM Kelby Aline., MHA, RVT, RDCS, RDMS

## 2019-07-07 NOTE — Progress Notes (Signed)
Cardiology to [facility] transfer:  Patient with h/o DM; CVA (2016); HTN; HLD; CAD s/p stent (2019); stage IV CKD; chronic combined CHF; and afib on Coumadin who was last admitted to [facility] service from 11/5-8 with DKA.  She was admitted as a direct admission to cardiology on 11/19 with worsening CHF, renal function worsening, volume overload.  Her renal function has not improved and she transitioned to ESRD and started HD yesterday.  AV fistula to be placed Monday.  Cardiology prefers to have [facility] assume care since her volume status will now be optimized through HD.  [facility] to assume care on 11/28.   Carlyon Shadow, M.D.

## 2019-07-07 NOTE — Progress Notes (Signed)
ANTICOAGULATION CONSULT NOTE  Pharmacy Consult:  Heparin Indication: atrial fibrillation (CHADS2VASc = 7),  Patient Measurements: Height: 5\' 7"  (170.2 cm) Weight: 172 lb 9.9 oz (78.3 kg)(bed) IBW/kg (Calculated) : 61.6 Heparin Dosing Weight: 76.8 kg   Vital Signs: Temp: 98.6 F (37 C) (11/27 1720) Temp Source: Oral (11/27 1720) BP: 160/63 (11/27 1720) Pulse Rate: 60 (11/27 1720)  Labs: Recent Labs    07/05/19 0821 07/05/19 1358 07/06/19 0335 07/06/19 1156 07/07/19 0356 07/07/19 1646  HGB 8.2*  --  7.6* 7.8* 7.5*  --   HCT 25.5*  --  23.2* 24.2* 23.7*  --   PLT 282  --  246 242 248  --   LABPROT 26.0* 20.5* 17.6*  --  16.3*  --   INR 2.4* 1.8* 1.5*  --  1.3*  --   HEPARINUNFRC  --   --  0.39 0.11*  --  0.34  CREATININE 6.06*  --  4.45*  --  4.66*  --     Estimated Creatinine Clearance: 13.3 mL/min (A) (by C-G formula based on SCr of 4.66 mg/dL (H)).   Assessment: 63 yo female on warfarin at home for hx Afib (CHADS2VASc = 7). Pharmacy originally consulted bridge IV heparin to Coumadin given sub-therapeutic INR on admit and was at goal on heparin 950 units/hr. All anticoagulation held 11/22 and warfarin reversed with vitamin K and FFP 11/25 for urgent HD line placement 11/25. Heparin was restarted after this with level at goal within 6 hr, but patient had significant oozing from line site so heparin was discontinued again on 11/26.   Patient is no longer oozing 11/27, so pharmacy was re-consulted for heparin. Warfarin continues to be on hold pending permanent HD access. Will target lower end of goal range given recent bleeding and line placement.   Heparin level this evening is therapeutic at 0.34. Hg down a bit to 7.5 today, plt wnl.  No current active bleed issues documented.  Goal of Therapy:  Heparin level goal 0.3-0.7 INR 2-3 Monitor platelets by anticoagulation protocol: Yes   Plan:   Continue heparin at 1000 units/hr Confirmatory heparin level with AM  labs Monitor daily heparin level and CBC, s/sx bleeding   Amber Young, PharmD, BCPS Please check AMION for all [place] contact numbers Clinical Pharmacist 07/07/2019 6:16 PM

## 2019-07-07 NOTE — Progress Notes (Addendum)
Pt HD catheter site was clean, dry, and intact upon initial assessment at 1910. Very faint amount of drainage was noted and marked at the HD site at 2100. Dressing still intact, no leakage, no pain. Pt sitting up in the chair. Monitoring.   New order for hemodialysis on 11/26 to be discontinued. Pt's nightly medications, carvedilol and hydralazine, were administered at 2245. No new drainage on HD site.  At 0000, pt asleep. Very small amount of new drainage noted. Dressing still intact. Will continue to monitor.   No new drainage the rest of the morning. Changed dressing at 0630, Quikclot still in place, new gauze and surgical tape placed. Pt headed to dialysis soon.

## 2019-07-07 NOTE — Progress Notes (Addendum)
Progress Note  Patient Name: Amber Young Date of Encounter: 07/07/2019  Primary Cardiologist: Skeet Latch, MD   Subjective   Felling better. No SOB.   Inpatient Medications    Scheduled Meds: . amLODipine  10 mg Oral Daily  . atorvastatin  40 mg Oral q1800  . brimonidine  1 drop Left Eye BID  . carvedilol  25 mg Oral BID  . Chlorhexidine Gluconate Cloth  6 each Topical Daily  . Chlorhexidine Gluconate Cloth  6 each Topical Q0600  . dorzolamide-timolol  1 drop Left Eye BID  . ferrous sulfate  325 mg Oral QODAY  . hydrALAZINE  100 mg Oral TID  . insulin aspart  0-9 Units Subcutaneous Q4H  . isosorbide mononitrate  30 mg Oral Daily  . ketorolac  1 drop Right Eye QID  . ofloxacin  1 drop Right Eye TID   Continuous Infusions: . sodium chloride 250 mL (07/03/19 1720)   PRN Meds: sodium chloride, acetaminophen, ALPRAZolam, heparin, lidocaine, nitroGLYCERIN, ondansetron (ZOFRAN) IV, zolpidem   Vital Signs    Vitals:   07/06/19 1200 07/06/19 1616 07/06/19 2057 07/07/19 0430  BP: (!) 156/61 (!) 152/60 (!) 175/71 (!) 157/65  Pulse: 62 (!) 59 63 (!) 55  Resp:  20 18 18   Temp: 97.8 F (36.6 C) 97.8 F (36.6 C) 98.7 F (37.1 C) 98.3 F (36.8 C)  TempSrc: Oral Oral Oral Oral  SpO2: 99% 100% 96% 100%  Weight:      Height:        Intake/Output Summary (Last 24 hours) at 07/07/2019 0734 Last data filed at 07/06/2019 2100 Gross per 24 hour  Intake 640 ml  Output 600 ml  Net 40 ml   Last 3 Weights 07/06/2019 07/05/2019 07/05/2019  Weight (lbs) 172 lb 9.6 oz 176 lb 12.9 oz 175 lb 9.6 oz  Weight (kg) 78.291 kg 80.2 kg 79.652 kg      Telemetry    NSR- Personally Reviewed  ECG    No new EKG to review- Personally Reviewed  Physical Exam   VS:  BP (!) 157/65   Pulse (!) 55   Temp 98.3 F (36.8 C) (Oral)   Resp 18   Ht 5\' 7"  (1.702 m)   Wt 78.3 kg   SpO2 100%   BMI 27.03 kg/m  , BMI Body mass index is 27.03 kg/m.   GEN: Well nourished, well  developed in no acute distress HEENT: Normal NECK: No JVD; No carotid bruits LYMPHATICS: No lymphadenopathy CARDIAC:RRR, no murmurs, rubs, gallops RESPIRATORY:  Clear to auscultation without rales, wheezing or rhonchi  ABDOMEN: Soft, non-tender, non-distended MUSCULOSKELETAL:  No edema; No deformity  SKIN: Warm and dry NEUROLOGIC:  Alert and oriented x 3 PSYCHIATRIC:  Normal affect   Labs    High Sensitivity Troponin:   Recent Labs  Lab 06/15/19 0009 06/15/19 1748 06/16/19 0323 06/16/19 0550 06/16/19 0926  TROPONINIHS 76* 70* 86* 91* 96*      Chemistry Recent Labs  Lab 07/05/19 0821 07/06/19 0335 07/07/19 0356  NA 132* 134* 135  K 3.6 3.5 3.3*  CL 93* 95* 95*  CO2 25 26 26   GLUCOSE 109* 116* 93  BUN 91* 57* 66*  CREATININE 6.06* 4.45* 4.66*  CALCIUM 8.9 8.4* 8.9  ALBUMIN 2.9* 2.8* 2.9*  GFRNONAA 7* 10* 9*  GFRAA 8* 11* 11*  ANIONGAP 14 13 14      Hematology Recent Labs  Lab 07/06/19 0335 07/06/19 1156 07/07/19 0356  WBC 6.1 6.5 5.4  RBC 3.12* 3.18* 3.14*  HGB 7.6* 7.8* 7.5*  HCT 23.2* 24.2* 23.7*  MCV 74.4* 76.1* 75.5*  MCH 24.4* 24.5* 23.9*  MCHC 32.8 32.2 31.6  RDW 16.3* 16.2* 16.1*  PLT 246 242 248    BNP No results for input(s): BNP, PROBNP in the last 168 hours.   DDimer No results for input(s): DDIMER in the last 168 hours.   Radiology    Ir Fluoro Guide Cv Line Right  Result Date: 07/05/2019 CLINICAL DATA:  Renal failure and need for hemodialysis catheter. EXAM: NON-TUNNELED CENTRAL VENOUS HEMODIALYSIS CATHETER PLACEMENT WITH ULTRASOUND AND FLUOROSCOPIC GUIDANCE FLUOROSCOPY TIME:  6 seconds.  0.2 mGy. PROCEDURE: The procedure, risks, benefits, and alternatives were explained to the patient. Questions regarding the procedure were encouraged and answered. The patient understands and consents to the procedure. A time-out was performed prior to initiating the procedure. The right neck and chest were prepped with chlorhexidine in a sterile  fashion, and a sterile drape was applied covering the operative field. Maximum barrier sterile technique with sterile gowns and gloves were used for the procedure. Local anesthesia was provided with 1% lidocaine. Ultrasound was used to confirm patency of the right internal jugular vein. After creating a small venotomy incision, a 21 gauge needle was advanced into the right internal jugular vein under direct, real-time ultrasound guidance. Ultrasound image documentation was performed. After securing guidewire access, the venotomy was dilated. A triple-lumen, 16 cm, 13 [place] Mah[place]kar catheter was advanced over the wire. Final catheter positioning was confirmed and documented with a fluoroscopic spot image. The catheter was aspirated, flushed with saline, and injected with appropriate volume heparin dwells. The catheter exit site was secured with 0-Prolene retention sutures. COMPLICATIONS: None.  No pneumothorax. FINDINGS: After catheter placement, the tip lies at the cavoatrial junction. The catheter aspirates normally and is ready for immediate use. IMPRESSION: Placement of non-tunneled central venous hemodialysis catheter via the right internal jugular vein. The catheter tip lies at the cavoatrial junction. The catheter is ready for immediate use. Electronically Signed   By: Aletta Edouard M.D.   On: 07/05/2019 17:59   Ir US Guide Vasc Access Right  Result Date: 07/05/2019 CLINICAL DATA:  Renal failure and need for hemodialysis catheter. EXAM: NON-TUNNELED CENTRAL VENOUS HEMODIALYSIS CATHETER PLACEMENT WITH ULTRASOUND AND FLUOROSCOPIC GUIDANCE FLUOROSCOPY TIME:  6 seconds.  0.2 mGy. PROCEDURE: The procedure, risks, benefits, and alternatives were explained to the patient. Questions regarding the procedure were encouraged and answered. The patient understands and consents to the procedure. A time-out was performed prior to initiating the procedure. The right neck and chest were prepped with chlorhexidine in a  sterile fashion, and a sterile drape was applied covering the operative field. Maximum barrier sterile technique with sterile gowns and gloves were used for the procedure. Local anesthesia was provided with 1% lidocaine. Ultrasound was used to confirm patency of the right internal jugular vein. After creating a small venotomy incision, a 21 gauge needle was advanced into the right internal jugular vein under direct, real-time ultrasound guidance. Ultrasound image documentation was performed. After securing guidewire access, the venotomy was dilated. A triple-lumen, 16 cm, 13 [place] Mahurkar catheter was advanced over the wire. Final catheter positioning was confirmed and documented with a fluoroscopic spot image. The catheter was aspirated, flushed with saline, and injected with appropriate volume heparin dwells. The catheter exit site was secured with 0-Prolene retention sutures. COMPLICATIONS: None.  No pneumothorax. FINDINGS: After catheter placement, the tip lies at the cavoatrial junction. The catheter  aspirates normally and is ready for immediate use. IMPRESSION: Placement of non-tunneled central venous hemodialysis catheter via the right internal jugular vein. The catheter tip lies at the cavoatrial junction. The catheter is ready for immediate use. Electronically Signed   By: Aletta Edouard M.D.   On: 07/05/2019 17:59    Cardiac Studies   2D echo 07/01/2019 IMPRESSIONS  1. Left ventricular ejection fraction, by visual estimation, is 35 to 40%. The left ventricle has moderately decreased function. There is mildly increased left ventricular hypertrophy. 2. Left ventricular diastolic parameters are consistent with Grade II diastolic dysfunction (pseudonormalization). 3. Mildly dilated left ventricular internal cavity size. 4. Diffuse hypokinesis worse in septum. 5. Global right ventricle has normal systolic function.The right ventricular size is normal. No increase in right ventricular wall  thickness. 6. Left atrial size was moderately dilated. 7. Right atrial size was normal. 8. The mitral valve is normal in structure. Mild mitral valve regurgitation. 9. The tricuspid valve is normal in structure. Tricuspid valve regurgitation mild-moderate. 10. The aortic valve is tricuspid. Aortic valve regurgitation is not visualized. Mild aortic valve sclerosis without stenosis. 11. The pulmonic valve was grossly normal. Pulmonic valve regurgitation is mild. 12. Moderately elevated pulmonary artery systolic pressure.  Patient Profile     63 y.o. 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  Assessment & Plan    1. Acute on chronic systolic and diastolic heart failure: -LVEF this admission 35-40%, up from 25-30% 01/2019.  Prior to that it was 40-45% 07/2018.   -She remains volume overloaded but much better.   -She had 1L off with HD11/25 and HD held yestedary.   -Diuretics and volume management per nephrology -Continue carvedilol, hydralazine and Imdur.   -Given her reduced systolic function that she is on HD now, consider adding ARB and getting her off amlodipine if OK with nephrology.  2. Essential hypertension: -BP remains elevated Continue amlodipine, carvedilol, Imdur, and hydralazine.   -again would recommend stopping amlodipine and starting ARB for LV dysfunction if ok with nephrology  3. Acute on chronic renal failure: Started HD 11/25.    4. PAF:  -She is maintaining sinus rhythm.   -Coumadin on hold.   -She is currently on heparin but she has active oozing from her catheter site.  OK to hold.  -Continue carvedilol.  -restart coumadin when ok with nephrology  5. CAD s/p PCI: -LAD PCI in 2019.   -She isn't on aspirin given clopidogrel and warfarin.   -Continue  atorvastatin and BB.   -resume Plavix when ok with [facility]/nephrology  6. Prior CVA:  - OK to hold clopidogrel 2/2 bleeding.  - Resume when able.   No new cardiac issues to address at this time except for med changes recommended from above. I have talked with Dr. Lorin Mercy with [facility] who will assume care on 07/08/2019.  We will sign off at that time and have patient followup with [place] in 2-3 weeks in the office with Dr. Oval Linsey  For questions or updates, please contact [place] Please consult [email or web address] for contact info under        Signed, Fransico Him, MD  07/07/2019, 7:34 AM

## 2019-07-08 LAB — RENAL FUNCTION PANEL
Albumin: 2.8 g/dL — ABNORMAL LOW (ref 3.5–5.0)
Anion gap: 12 (ref 5–15)
BUN: 42 mg/dL — ABNORMAL HIGH (ref 8–23)
CO2: 25 mmol/L (ref 22–32)
Calcium: 8.7 mg/dL — ABNORMAL LOW (ref 8.9–10.3)
Chloride: 97 mmol/L — ABNORMAL LOW (ref 98–111)
Creatinine, Ser: 4.3 mg/dL — ABNORMAL HIGH (ref 0.44–1.00)
GFR calc Af Amer: 12 mL/min — ABNORMAL LOW (ref >60)
GFR calc non Af Amer: 10 mL/min — ABNORMAL LOW (ref >60)
Glucose, Bld: 125 mg/dL — ABNORMAL HIGH (ref 70–99)
Phosphorus: 4 mg/dL (ref 2.5–4.6)
Potassium: 3.7 mmol/L (ref 3.5–5.1)
Sodium: 134 mmol/L — ABNORMAL LOW (ref 135–145)

## 2019-07-08 LAB — GLUCOSE, CAPILLARY
Glucose-Capillary: 109 mg/dL — ABNORMAL HIGH (ref 70–99)
Glucose-Capillary: 143 mg/dL — ABNORMAL HIGH (ref 70–99)
Glucose-Capillary: 147 mg/dL — ABNORMAL HIGH (ref 70–99)
Glucose-Capillary: 188 mg/dL — ABNORMAL HIGH (ref 70–99)
Glucose-Capillary: 221 mg/dL — ABNORMAL HIGH (ref 70–99)
Glucose-Capillary: 229 mg/dL — ABNORMAL HIGH (ref 70–99)
Glucose-Capillary: 96 mg/dL (ref 70–99)

## 2019-07-08 LAB — BASIC METABOLIC PANEL
Anion gap: 14 (ref 5–15)
BUN: 44 mg/dL — ABNORMAL HIGH (ref 8–23)
CO2: 24 mmol/L (ref 22–32)
Calcium: 8.8 mg/dL — ABNORMAL LOW (ref 8.9–10.3)
Chloride: 97 mmol/L — ABNORMAL LOW (ref 98–111)
Creatinine, Ser: 4.58 mg/dL — ABNORMAL HIGH (ref 0.44–1.00)
GFR calc Af Amer: 11 mL/min — ABNORMAL LOW (ref >60)
GFR calc non Af Amer: 10 mL/min — ABNORMAL LOW (ref >60)
Glucose, Bld: 143 mg/dL — ABNORMAL HIGH (ref 70–99)
Potassium: 3.9 mmol/L (ref 3.5–5.1)
Sodium: 135 mmol/L (ref 135–145)

## 2019-07-08 LAB — CBC
HCT: 23.6 % — ABNORMAL LOW (ref 36.0–46.0)
Hemoglobin: 7.5 g/dL — ABNORMAL LOW (ref 12.0–15.0)
MCH: 23.9 pg — ABNORMAL LOW (ref 26.0–34.0)
MCHC: 31.8 g/dL (ref 30.0–36.0)
MCV: 75.2 fL — ABNORMAL LOW (ref 80.0–100.0)
Platelets: 254 10*3/uL (ref 150–400)
RBC: 3.14 MIL/uL — ABNORMAL LOW (ref 3.87–5.11)
RDW: 16.4 % — ABNORMAL HIGH (ref 11.5–15.5)
WBC: 6.2 10*3/uL (ref 4.0–10.5)
nRBC: 0 % (ref 0.0–0.2)

## 2019-07-08 LAB — PROTIME-INR
INR: 1.3 — ABNORMAL HIGH (ref 0.8–1.2)
Prothrombin Time: 15.8 seconds — ABNORMAL HIGH (ref 11.4–15.2)

## 2019-07-08 LAB — HEPARIN LEVEL (UNFRACTIONATED): Heparin Unfractionated: 0.48 IU/mL (ref 0.30–0.70)

## 2019-07-08 MED ORDER — CHLORHEXIDINE GLUCONATE CLOTH 2 % EX PADS
6.0000 | MEDICATED_PAD | Freq: Every day | CUTANEOUS | Status: DC
  Administered 2019-07-09 – 2019-07-13 (×3): 6 via TOPICAL

## 2019-07-08 NOTE — Progress Notes (Signed)
PROGRESS NOTE  Amber Young [id number] DOB: [birth year] DOA: 06/29/2019 PCP: Azzie Glatter, FNP  Brief History   Patient with h/o DM; CVA (2016); HTN; HLD; CAD s/p stent (2019); stage IV CKD; chronic combined CHF; and afib on Coumadin who was last admitted to [facility] service from 11/5-8 with DKA.  She was admitted as a direct admission to cardiology on 11/19 with worsening CHF, renal function worsening, volume overload.  Her renal function has not improved and she transitioned to ESRD and started HD on 07/07/2019.  AV fistula to be placed Monday 07/10/2019.  Cardiology prefers to have [facility] assume care since her volume status will now be optimized through HD.  [facility] to assume care on 11/28.  Consultants  . Vascular surgery . Interventional Radiology . Nephrology  Procedures  . Hemodialysis  Antibiotics   Anti-infectives (From admission, onward)   Start     Dose/Rate Route Frequency Ordered Stop   07/07/19 1020  vancomycin (VANCOCIN) 1-5 GM/200ML-% IVPB    Note to Pharmacy: Murriel Hopper   : cabinet override      07/07/19 1020 07/07/19 2229   07/05/19 1100  ceFAZolin (ANCEF) IVPB 2g/100 mL premix     2 g 200 mL/hr over 30 Minutes Intravenous To Radiology 07/05/19 0954 07/06/19 1100    .  Subjective  The patient is resting comfortably. No new complaints.   Objective   Vitals:  Vitals:   07/08/19 0855 07/08/19 1329  BP: (!) 164/75 (!) 146/59  Pulse: 60 (!) 58  Resp:    Temp:  98.9 F (37.2 C)  SpO2:  100%   Exam:  Constitutional:  . The patient is awake, alert, and oriented x 3. No acute distress. Respiratory:  . No increased work of breathing. . No wheezes, rales, or rhonchi . No tactile fremitus Cardiovascular:  . Regular rate and rhythm . No murmurs, ectopy, or gallups. . No lateral PMI. No thrills. Abdomen:  . Abdomen is soft, non-tender, non-distended . No hernias, masses, or organomegaly . Normoactive bowel sounds.  Musculoskeletal:  . No cyanosis,  clubbing, or edema Skin:  . No rashes, lesions, ulcers . palpation of skin: no induration or nodules Neurologic:  . CN 2-12 intact . Sensation all 4 extremities intact Psychiatric:  . Mental status o Mood, affect appropriate o Orientation to person, place, time  . judgment and insight appear intact  I have personally reviewed the following:   Today's Data  . Vitals, BMP, CBC  Scheduled Meds: . amLODipine  10 mg Oral Daily  . atorvastatin  40 mg Oral q1800  . brimonidine  1 drop Left Eye BID  . carvedilol  25 mg Oral BID  . Chlorhexidine Gluconate Cloth  6 each Topical Daily  . Chlorhexidine Gluconate Cloth  6 each Topical Q0600  . dorzolamide-timolol  1 drop Left Eye BID  . ferrous sulfate  325 mg Oral QODAY  . hydrALAZINE  100 mg Oral TID  . insulin aspart  0-9 Units Subcutaneous Q4H  . isosorbide mononitrate  30 mg Oral Daily  . ketorolac  1 drop Right Eye QID  . ofloxacin  1 drop Right Eye TID   Continuous Infusions: . sodium chloride 250 mL (07/03/19 1720)  . heparin 1,000 Units/hr (07/08/19 0901)    Active Problems:   Acute on chronic combined systolic and diastolic CHF (congestive heart failure) (HCC)   CKD (chronic kidney disease), stage V ([place])   LOS: 9 days   A & P  Acute on  chronic systolic and diastolic heart failure: LVEF this admission 35-40%, up from 25-30% 01/2019.  Prior to that it was 40-45% 07/2018.  She remains volume overloaded but much better. Volume management is now largely by HD and nephrology. She has been continued on carvedilol, hydralazine and Imdur. The recommendation from cardiology is to replace her CCB with ARB if okay with nephrology.  Essential hypertension: BP remains elevated. Continue amlodipine, carvedilol, Imdur, and hydralazine. The recommendation from cardiology is to replace her CCB with ARB if okay with nephrology.  Acute on chronic renal failure: Started HD 11/25.  Fistula placement on Monday.  Paroxysmal atrial  fibrillation: Currently in sinus rhythm. Coumadin is held pending her procedure on Monday. Heparin is used to bridge, but is being held due to oozing from catheter site. Continue carvedilol. Resume coumadin when Okay with vascular surgery/Nephrology.  CAD s/p PCI: LAD PCI in 2019.  She is on clopidogrel and warfarin, but no ASA. Continue atorvastatin and beta blocker. Resume plavix when okay with vascular surgery/nephrology.  Prior CVA: OK to hold clopidogrel 2/2 bleeding and pending fistula placement on 07/10/2019.  I have seen and examined this patient myself. I have spent 32 minutes in her evaluation and care.  Aarron Wierzbicki, DO [facility] Direct contact: see [email or web address]  7PM-7AM contact night coverage as above 07/08/2019, 3:30 PM  LOS: 9 days

## 2019-07-08 NOTE — Progress Notes (Addendum)
Plan for left Basilsic AV fistula verse graft Monday 07/10/2019. Patient is in agreement NPO past MN Sunday 07/09/2019.  Roxy Horseman PA-C   +-----------------+-------------+----------+---------+ Right Cephalic   Diameter (cm)Depth (cm)Findings  +-----------------+-------------+----------+---------+ Shoulder             0.15        0.59             +-----------------+-------------+----------+---------+ Prox upper arm       0.17        0.57             +-----------------+-------------+----------+---------+ Mid upper arm        0.22        0.62             +-----------------+-------------+----------+---------+ Dist upper arm       0.23        0.60             +-----------------+-------------+----------+---------+ Antecubital fossa    0.20        0.23   branching +-----------------+-------------+----------+---------+ Prox forearm         0.17        0.51   branching +-----------------+-------------+----------+---------+ Mid forearm          0.18        0.58             +-----------------+-------------+----------+---------+ Dist forearm         0.17        0.48             +-----------------+-------------+----------+---------+ Wrist                0.11        0.17             +-----------------+-------------+----------+---------+  +-----------------+-------------+----------+--------------+ Right Basilic    Diameter (cm)Depth (cm)   Findings    +-----------------+-------------+----------+--------------+ Prox upper arm       0.30                              +-----------------+-------------+----------+--------------+ Mid upper arm        0.28                              +-----------------+-------------+----------+--------------+ Dist upper arm       0.34                              +-----------------+-------------+----------+--------------+ Antecubital fossa    0.33                               +-----------------+-------------+----------+--------------+ Prox forearm         0.27                              +-----------------+-------------+----------+--------------+ Mid forearm          0.29                              +-----------------+-------------+----------+--------------+ Distal forearm                          not visualized +-----------------+-------------+----------+--------------+ Wrist  not visualized +-----------------+-------------+----------+--------------+  +-----------------+-------------+----------+--------------+ Left Cephalic    Diameter (cm)Depth (cm)   Findings    +-----------------+-------------+----------+--------------+ Shoulder             0.16        0.63                  +-----------------+-------------+----------+--------------+ Prox upper arm       0.15        0.66                  +-----------------+-------------+----------+--------------+ Mid upper arm        0.19        0.52                  +-----------------+-------------+----------+--------------+ Dist upper arm       0.24        0.43                  +-----------------+-------------+----------+--------------+ Antecubital fossa    0.38        0.32                  +-----------------+-------------+----------+--------------+ Prox forearm         0.25        0.49     branching    +-----------------+-------------+----------+--------------+ Mid forearm          0.09        0.28     branching    +-----------------+-------------+----------+--------------+ Dist forearm         0.08        0.20                  +-----------------+-------------+----------+--------------+ Wrist                                   not visualized +-----------------+-------------+----------+--------------+  +-----------------+-------------+----------+--------------+ Left Basilic     Diameter (cm)Depth (cm)    Findings    +-----------------+-------------+----------+--------------+ Mid upper arm        0.36                              +-----------------+-------------+----------+--------------+ Dist upper arm       0.35                              +-----------------+-------------+----------+--------------+ Antecubital fossa    0.23                 branching    +-----------------+-------------+----------+--------------+ Prox forearm         0.18                              +-----------------+-------------+----------+--------------+ Mid forearm                             not visualized +-----------------+-------------+----------+--------------+ Distal forearm                          not visualized +-----------------+-------------+----------+--------------+ Wrist                                   not  visualized +-----------------+-------------+----------+--------------+  *See table(s) above for measurements and observations.

## 2019-07-08 NOTE — Progress Notes (Signed)
ANTICOAGULATION CONSULT NOTE  Pharmacy Consult:  Heparin Indication: atrial fibrillation (CHADS2VASc = 7),  Patient Measurements: Height: 5\' 7"  (170.2 cm) Weight: 170 lb 9.6 oz (77.4 kg) IBW/kg (Calculated) : 61.6 Heparin Dosing Weight: 76.8 kg   Vital Signs: Temp: 98.5 F (36.9 C) (11/28 0514) Temp Source: Oral (11/28 0514) BP: 156/66 (11/28 0514) Pulse Rate: 59 (11/28 0514)  Labs: Recent Labs    07/06/19 0335 07/06/19 1156 07/07/19 0356 07/07/19 1646 07/08/19 0458  HGB 7.6* 7.8* 7.5*  --  7.5*  HCT 23.2* 24.2* 23.7*  --  23.6*  PLT 246 242 248  --  254  LABPROT 17.6*  --  16.3*  --  15.8*  INR 1.5*  --  1.3*  --  1.3*  HEPARINUNFRC 0.39 0.11*  --  0.34 0.48  CREATININE 4.45*  --  4.66*  --  4.30*    Estimated Creatinine Clearance: 14.4 mL/min (A) (by C-G formula based on SCr of 4.3 mg/dL (H)).   Assessment: 63 yo female on warfarin at home for hx Afib (CHADS2VASc = 7). Pharmacy originally consulted bridge IV heparin to Coumadin given sub-therapeutic INR on admit and was at goal on heparin 950 units/hr. All anticoagulation held 11/22 and warfarin reversed with vitamin K and FFP 11/25 for urgent HD line placement 11/25. Heparin was restarted after this with level at goal within 6 hr, but patient had significant oozing from line site so heparin was discontinued again on 11/26.   Patient is no longer oozing 11/27, so pharmacy was re-consulted for heparin. Warfarin continues to be on hold pending permanent HD access. Will target lower end of goal range given recent bleeding and line placement.    Heparin level this morning is therapeutic at 0.48 on heparin rate of 1000 units/hr. Hg is stable and unchanged at 7.5 today, plt wnl.  No current active bleed issues per RN. No issues with infusion.  Goal of Therapy:  Heparin level goal 0.3-0.7 INR 2-3 Monitor platelets by anticoagulation protocol: Yes   Plan:   Continue heparin at 1000 units/hr Monitor daily heparin level  with AM labs Monitor daily CBC, s/sx bleeding F/U restart of warfarin once permanent HD access placed (planned 11/30)  Sherren Kerns, PharmD PGY1 [facility] Resident 07/08/2019 7:58 AM

## 2019-07-08 NOTE — Progress Notes (Signed)
[facility] Progress Note  Name: Amber Young MRN: [id number] DOB: 06-03-1956  Chief Complaint:  Shortness of breath   Subjective:  Seen by vascular with plans for access next week - tunneled catheter and permanent access.  Had 400 mL UOP over 11/27 charted.  Had HD on 11/27 with 1.5 kg UF. Feels swollen.  Dressing over catheter was changed overnight   Review of systems:    No shortness of breath  Some nausea ; no vomiting  No chest pain  Urinating ok ---------- Background on consult:  Pt is a 46F with a PMH sig for HTN, HLD, Afib, CHF with EF 25-30%, CKD with baseline Cr around 3 who is now seen in consultation at the request of Dr. Harl Bowie for evaluation and recommendations surrounding AKI on CKD IV. Typically follows with Dr. Posey Pronto.  She was recently admitted 11/5-11/8 with AKI on CKD and CHF exac.  D/c Cr was 4.5 at that time, usually baseline is around 3.5.  Losartan/ Lasix/ metolazone held at d/c.  Presented back to CHF clinic 11/19 with increased LE edema, weights up, and DOE.  She was admitted.  Started on IV Lasix 80 TID.  Has been making about 2L urine daily.  Weights have been slowly going down but are only down about 4lbs.  Metolazone has been held today.    Cr 4.3 on admission, is 4.5 today.  In this setting we are asked to see.  Pt reports her legs feel tight and uncomfortable.  BP is high.  On a nitro gtt now.  She denies CP.  No HA/ blurry vision.      Intake/Output Summary (Last 24 hours) at 07/08/2019 0815 Last data filed at 07/08/2019 0300 Gross per 24 hour  Intake 1451.79 ml  Output 1900 ml  Net -448.21 ml    Vitals:  Vitals:   07/07/19 1720 07/07/19 2041 07/08/19 0117 07/08/19 0514  BP: (!) 160/63 (!) 150/62  (!) 156/66  Pulse: 60 63  (!) 59  Resp: 17 16  14   Temp: 98.6 F (37 C) 97.9 F (36.6 C)  98.5 F (36.9 C)  TempSrc: Oral Oral  Oral  SpO2: 99% 100%  100%  Weight:   77.4 kg   Height:         Physical Exam:   General adult  female seated in no acute distress   HEENT normocephalic atraumatic extraocular movements intact sclera anicteric Neck supple trachea midline Lungs clear on auscultation reduced at bases; normal work of breathing at rest  Heart regular rate and rhythm no rubs or gallops appreciated Abdomen soft nontender nondistended Extremities 2+ edema lower extremities Psych normal mood and affect Neuro - alert and oriented x 3; follows commands and provides hx Access right IJ nontunneled catheter in place  Medications reviewed   Labs:  BMP Latest Ref Rng & Units 07/08/2019 07/07/2019 07/06/2019  Glucose 70 - 99 mg/dL 125(H) 93 116(H)  BUN 8 - 23 mg/dL 42(H) 66(H) 57(H)  Creatinine 0.44 - 1.00 mg/dL 4.30(H) 4.66(H) 4.45(H)  BUN/Creat Ratio 12 - 28 - - -  Sodium 135 - 145 mmol/L 134(L) 135 134(L)  Potassium 3.5 - 5.1 mmol/L 3.7 3.3(L) 3.5  Chloride 98 - 111 mmol/L 97(L) 95(L) 95(L)  CO2 22 - 32 mmol/L 25 26 26   Calcium 8.9 - 10.3 mg/dL 8.7(L) 8.9 8.4(L)     Assessment/Plan:   1.  AKI on CKD IV which has progressed to ESRD:  Multiple recent insults; baseline 3.5 in June  and now with recent admission for DKA where d/c Cr was 4.6 11/8.  Now with difficulty getting fluid off. Discussed with pt-- she follows with Dr Posey Pronto and they have discussed HD.  Had nontunneled catheter placed with IR on 11/25.  Had first HD on 11/25 with 1 kg UF  With 2nd tx 11/27 - HD on 11/28.  Then will plan on next HD on 12/1 (Tuesday) per a TTS schedule unless needed sooner  - Hold lasix on HD days for now - Will need CLIP - discussed with dialysis coordinator and this will need to be set up early next week - Discontinued plavix for now  - She is for tunneled catheter and perm access with Dr. Scot Dock on 11/30    2.  Acute on chronic CHF: EF 25-30%.  Cardiology following. Optimize volume with HD   3.  DM II: recent admission for DKA  4.  Afib: heparin gtt - appreciate pharmacy and primary.  To please remain off of  coumadin for access placement with vascular which appears scheduled for 11/30  5. HTN - with overload - improving.  diurese as above and follow with HD    6. Anemia of CKD - iron stores ok on oral iron. aranesp 40 mcg on 11/23 - will need dose increase for next dose.  Acute blood loss with oozing from dialysis catheter site     Claudia Desanctis, MD 07/08/2019 8:15 AM

## 2019-07-09 LAB — GLUCOSE, CAPILLARY
Glucose-Capillary: 125 mg/dL — ABNORMAL HIGH (ref 70–99)
Glucose-Capillary: 98 mg/dL (ref 70–99)
Glucose-Capillary: 116 mg/dL — ABNORMAL HIGH (ref 70–99)
Glucose-Capillary: 159 mg/dL — ABNORMAL HIGH (ref 70–99)
Glucose-Capillary: 180 mg/dL — ABNORMAL HIGH (ref 70–99)

## 2019-07-09 LAB — RENAL FUNCTION PANEL
Albumin: 2.8 g/dL — ABNORMAL LOW (ref 3.5–5.0)
Anion gap: 13 (ref 5–15)
BUN: 51 mg/dL — ABNORMAL HIGH (ref 8–23)
CO2: 24 mmol/L (ref 22–32)
Calcium: 8.8 mg/dL — ABNORMAL LOW (ref 8.9–10.3)
Chloride: 96 mmol/L — ABNORMAL LOW (ref 98–111)
Creatinine, Ser: 4.93 mg/dL — ABNORMAL HIGH (ref 0.44–1.00)
GFR calc Af Amer: 10 mL/min — ABNORMAL LOW (ref >60)
GFR calc non Af Amer: 9 mL/min — ABNORMAL LOW (ref >60)
Glucose, Bld: 126 mg/dL — ABNORMAL HIGH (ref 70–99)
Phosphorus: 5.1 mg/dL — ABNORMAL HIGH (ref 2.5–4.6)
Potassium: 3.9 mmol/L (ref 3.5–5.1)
Sodium: 133 mmol/L — ABNORMAL LOW (ref 135–145)

## 2019-07-09 LAB — CBC WITH DIFFERENTIAL/PLATELET
Abs Immature Granulocytes: 0.02 10*3/uL (ref 0.00–0.07)
Basophils Absolute: 0 10*3/uL (ref 0.0–0.1)
Basophils Relative: 1 %
Eosinophils Absolute: 0.1 10*3/uL (ref 0.0–0.5)
Eosinophils Relative: 2 %
HCT: 23.7 % — ABNORMAL LOW (ref 36.0–46.0)
Hemoglobin: 7.6 g/dL — ABNORMAL LOW (ref 12.0–15.0)
Immature Granulocytes: 0 %
Lymphocytes Relative: 16 %
Lymphs Abs: 0.9 10*3/uL (ref 0.7–4.0)
MCH: 24.3 pg — ABNORMAL LOW (ref 26.0–34.0)
MCHC: 32.1 g/dL (ref 30.0–36.0)
MCV: 75.7 fL — ABNORMAL LOW (ref 80.0–100.0)
Monocytes Absolute: 0.9 10*3/uL (ref 0.1–1.0)
Monocytes Relative: 16 %
Neutro Abs: 3.8 10*3/uL (ref 1.7–7.7)
Neutrophils Relative %: 65 %
Platelets: 267 10*3/uL (ref 150–400)
RBC: 3.13 MIL/uL — ABNORMAL LOW (ref 3.87–5.11)
RDW: 16.4 % — ABNORMAL HIGH (ref 11.5–15.5)
WBC: 5.7 10*3/uL (ref 4.0–10.5)
nRBC: 0 % (ref 0.0–0.2)

## 2019-07-09 LAB — HEPARIN LEVEL (UNFRACTIONATED): Heparin Unfractionated: 0.54 IU/mL (ref 0.30–0.70)

## 2019-07-09 LAB — PROTIME-INR
INR: 1.2 (ref 0.8–1.2)
Prothrombin Time: 15.2 seconds (ref 11.4–15.2)

## 2019-07-09 MED ORDER — SODIUM CHLORIDE 0.9 % IV SOLN
1.5000 g | INTRAVENOUS | Status: AC
  Filled 2019-07-09: qty 1.5

## 2019-07-09 MED ORDER — DARBEPOETIN ALFA 100 MCG/0.5ML IJ SOSY
100.0000 ug | PREFILLED_SYRINGE | INTRAMUSCULAR | Status: DC
  Administered 2019-07-11: 20:00:00 100 ug via INTRAVENOUS
  Filled 2019-07-09: qty 0.5

## 2019-07-09 MED ORDER — FUROSEMIDE 10 MG/ML IJ SOLN
80.0000 mg | INTRAMUSCULAR | Status: DC
  Administered 2019-07-09: 80 mg via INTRAVENOUS
  Filled 2019-07-09: qty 8

## 2019-07-09 NOTE — Progress Notes (Signed)
[facility] Progress Note  Name: Amber Young MRN: [id number] DOB: 1956/02/01  Chief Complaint:  Shortness of breath   Subjective:    Seen by vascular with plans for access 11/30 - tunneled catheter and permanent access.  Had just 200 mL UOP over 11/28 charted.  Last HD on 11/27 as note her 11/29 tx postponed to today due to patient volumes.   Feels swollen.  We discussed procedures for tomorrow and she is aware   Review of systems:     Denies shortness of breath  Some nausea ; no vomiting  No chest pain  ---------- Background on consult:  Pt is a 89F with a PMH sig for HTN, HLD, Afib, CHF with EF 25-30%, CKD with baseline Cr around 3 who is now seen in consultation at the request of Dr. Harl Bowie for evaluation and recommendations surrounding AKI on CKD IV. Typically follows with Dr. Posey Pronto.  She was recently admitted 11/5-11/8 with AKI on CKD and CHF exac.  D/c Cr was 4.5 at that time, usually baseline is around 3.5.  Losartan/ Lasix/ metolazone held at d/c.  Presented back to CHF clinic 11/19 with increased LE edema, weights up, and DOE.  She was admitted.  Started on IV Lasix 80 TID.  Has been making about 2L urine daily.  Weights have been slowly going down but are only down about 4lbs.  Metolazone has been held today.    Cr 4.3 on admission, is 4.5 today.  In this setting we are asked to see.  Pt reports her legs feel tight and uncomfortable.  BP is high.  On a nitro gtt now.  She denies CP.  No HA/ blurry vision.      Intake/Output Summary (Last 24 hours) at 07/09/2019 0855 Last data filed at 07/09/2019 0522 Gross per 24 hour  Intake 1689.34 ml  Output 200 ml  Net 1489.34 ml    Vitals:  Vitals:   07/08/19 1655 07/08/19 2005 07/09/19 0414 07/09/19 0449  BP: (!) 164/66 (!) 154/71 (!) 152/70   Pulse:  63 (!) 57   Resp:  17 17   Temp:  97.8 F (36.6 C) 98.1 F (36.7 C)   TempSrc:  Oral Oral   SpO2:  100% 100%   Weight:    78.3 kg  Height:         Physical  Exam:    General adult female seated in no acute distress   HEENT normocephalic atraumatic extraocular movements intact sclera anicteric Neck supple trachea midline Lungs clear on auscultation reduced at bases; normal work of breathing at rest  Heart regular rate and rhythm no rubs or gallops appreciated Abdomen soft nontender nondistended Extremities 2+ edema lower extremities Psych normal mood and affect Neuro - alert and oriented x 3; follows commands and provides hx Access right IJ nontunneled catheter in place  Medications reviewed    Labs:  BMP Latest Ref Rng & Units 07/09/2019 07/08/2019 07/08/2019  Glucose 70 - 99 mg/dL 126(H) 143(H) 125(H)  BUN 8 - 23 mg/dL 51(H) 44(H) 42(H)  Creatinine 0.44 - 1.00 mg/dL 4.93(H) 4.58(H) 4.30(H)  BUN/Creat Ratio 12 - 28 - - -  Sodium 135 - 145 mmol/L 133(L) 135 134(L)  Potassium 3.5 - 5.1 mmol/L 3.9 3.9 3.7  Chloride 98 - 111 mmol/L 96(L) 97(L) 97(L)  CO2 22 - 32 mmol/L 24 24 25   Calcium 8.9 - 10.3 mg/dL 8.8(L) 8.8(L) 8.7(L)     Assessment/Plan:   1.  AKI on CKD IV  which has progressed to ESRD:  Multiple recent insults; baseline 3.5 in June and now with recent admission for DKA where d/c Cr was 4.6 11/8.  Now with difficulty getting fluid off. Discussed with pt-- she follows with Dr Posey Pronto and they have discussed HD.  Had nontunneled catheter placed with IR on 11/25.  Had first HD on 11/25 with 1 kg UF  With 2nd tx 11/27 - HD on 11/29 (tx postponed from 11/28 due to inpatient volumes).  Then will plan on next HD on 12/1 (Tuesday) per a TTS schedule   - Lasix ordered for today and tomorrow  - Will need CLIP - discussed with dialysis coordinator and this will need to be set up early next week - Discontinued plavix for now  - She is for tunneled catheter and perm access with Dr. Scot Dock on 11/30    2.  Acute on chronic CHF: EF 25-30%.  Cardiology following. Optimize volume with HD   3.  DM II: recent admission for DKA  4.  Afib:  heparin gtt - appreciate pharmacy and primary.  To please remain off of coumadin for access placement with vascular which appears scheduled for 11/30  5. HTN - with overload - improving.  diurese as above and follow with HD    6. Anemia of CKD - iron stores ok on oral iron. aranesp 40 mcg on 11/23 - will increase for next dose on 12/1 and dose each tues with HD for now.  Acute blood loss with oozing from dialysis catheter site    Claudia Desanctis, MD 07/09/2019 8:55 AM

## 2019-07-09 NOTE — Progress Notes (Signed)
PROGRESS NOTE  Amber Young [id number] DOB: August 09, 1956 DOA: 06/29/2019 PCP: Amber Glatter, FNP  Brief History   Patient with h/o DM; CVA (2016); HTN; HLD; CAD s/p stent (2019); stage IV CKD; chronic combined CHF; and afib on Coumadin who was last admitted to [facility] service from 11/5-8 with DKA.  She was admitted as a direct admission to cardiology on 11/19 with worsening CHF, renal function worsening, volume overload.  Her renal function has not improved and she transitioned to ESRD and started HD on 07/07/2019.  AV fistula to be placed Monday 07/10/2019.  Cardiology prefers to have Amber Young assume care since her volume status will now be optimized through HD.  TRH to assume care on 11/28.  The patient is now on a nitroglycerine drip. Nephrology is consulted as is vascular surgery. The patient will have a tunnelled catheter and permanent access placement on 07/10/2019. Coumadin is held and the patient is being bridged on heparin gtt for stroke prophylaxis pending procedure on Monday. Plavix has been discontinued.   Consultants  . Vascular surgery . Interventional Radiology . Nephrology . Cardiology  Procedures  . Hemodialysis  Antibiotics   Anti-infectives (From admission, onward)   Start     Dose/Rate Route Frequency Ordered Stop   07/10/19 0600  cefUROXime (ZINACEF) 1.5 g in sodium chloride 0.9 % 100 mL IVPB     1.5 g 200 mL/hr over 30 Minutes Intravenous On call to O.R. 07/09/19 0731 07/11/19 0559   07/07/19 1020  vancomycin (VANCOCIN) 1-5 GM/200ML-% IVPB    Note to Pharmacy: Murriel Hopper   : cabinet override      07/07/19 1020 07/07/19 2229   07/05/19 1100  ceFAZolin (ANCEF) IVPB 2g/100 mL premix     2 g 200 mL/hr over 30 Minutes Intravenous To Radiology 07/05/19 0954 07/06/19 1100     Subjective  The patient is resting comfortably. No new complaints.   Objective   Vitals:  Vitals:   07/09/19 0918 07/09/19 1226  BP: (!) 155/78 (!) 140/57  Pulse:  62  Resp:  20   Temp:  98.4 F (36.9 C)  SpO2:  100%   Exam:  Constitutional:  . The patient is awake, alert, and oriented x 3. No acute distress. Respiratory:  . No increased work of breathing. . No wheezes, rales, or rhonchi . No tactile fremitus Cardiovascular:  . Regular rate and rhythm . No murmurs, ectopy, or gallups. . No lateral PMI. No thrills. Abdomen:  . Abdomen is soft, non-tender, non-distended . No hernias, masses, or organomegaly . Normoactive bowel sounds.  Musculoskeletal:  . No cyanosis or clubbing . +2 pitting edema bilaterally. Skin:  . No rashes, lesions, ulcers . palpation of skin: no induration or nodules Neurologic:  . CN 2-12 intact . Sensation all 4 extremities intact Psychiatric:  . Mental status o Mood, affect appropriate o Orientation to person, place, time  . judgment and insight appear intact  I have personally reviewed the following:   Today's Data  . Vitals, BMP, CBC  Scheduled Meds: . amLODipine  10 mg Oral Daily  . atorvastatin  40 mg Oral q1800  . brimonidine  1 drop Left Eye BID  . carvedilol  25 mg Oral BID  . Chlorhexidine Gluconate Cloth  6 each Topical Daily  . Chlorhexidine Gluconate Cloth  6 each Topical Q0600  . Chlorhexidine Gluconate Cloth  6 each Topical Q0600  . [START ON 07/11/2019] darbepoetin (ARANESP) injection - DIALYSIS  100 mcg Intravenous Q Tue-HD  .  dorzolamide-timolol  1 drop Left Eye BID  . ferrous sulfate  325 mg Oral QODAY  . furosemide  80 mg Intravenous Q24H  . hydrALAZINE  100 mg Oral TID  . insulin aspart  0-9 Units Subcutaneous Q4H  . isosorbide mononitrate  30 mg Oral Daily  . ketorolac  1 drop Right Eye QID  . ofloxacin  1 drop Right Eye TID   Continuous Infusions: . sodium chloride 250 mL (07/03/19 1720)  . [START ON 07/10/2019] cefUROXime (ZINACEF)  IV    . heparin 950 Units/hr (07/09/19 0944)    Active Problems:   Acute on chronic combined systolic and diastolic CHF (congestive heart failure) (HCC)    CKD (chronic kidney disease), stage V ([name])   LOS: 10 days   A & P  Acute on chronic systolic and diastolic heart failure: LVEF this admission 35-40%, up from 25-30% 01/2019.  Prior to that it was 40-45% 07/2018.  She remains volume overloaded but much better. Volume management is now largely by HD and nephrology. She has been continued on carvedilol, hydralazine and Imdur. The recommendation from cardiology is to replace her CCB with ARB if okay with nephrology.  Essential hypertension: BP with improved control. Continue amlodipine, carvedilol, Imdur, and hydralazine. The recommendation from cardiology is to replace her CCB with ARB if okay with nephrology.  Acute on chronic renal failure: Started HD 11/25.  Fistula placement/tunnelled catheter on Monday.  Paroxysmal atrial fibrillation: Currently in sinus rhythm. Coumadin is held pending her procedure on Monday. Heparin is used to bridge. Continue carvedilol. Resume coumadin when Okay with vascular surgery/Nephrology.  CAD s/p PCI: LAD PCI in 2019.  She is on clopidogrel and warfarin, but no ASA. Plavix and warfarain are held. Continue atorvastatin and beta blocker. Resume plavix when okay with vascular surgery/nephrology.  Prior CVA: OK to hold clopidogrel due to  bleeding and pending fistula placement on 07/10/2019.  I have seen and examined this patient myself. I have spent 34 minutes in her evaluation and care.  Mindi Akerson, DO [facility] Direct contact: see [email or web address]  7PM-7AM contact night coverage as above 07/09/2019, 2:36 PM  LOS: 9 days

## 2019-07-09 NOTE — Progress Notes (Signed)
ANTICOAGULATION CONSULT NOTE  Pharmacy Consult:  Heparin Indication: atrial fibrillation (CHADS2VASc = 7),  Patient Measurements: Height: 5\' 7"  (170.2 cm) Weight: 172 lb 11.2 oz (78.3 kg) IBW/kg (Calculated) : 61.6 Heparin Dosing Weight: 76.8 kg   Vital Signs: Temp: 98.1 F (36.7 C) (11/29 0414) Temp Source: Oral (11/29 0414) BP: 155/78 (11/29 0918) Pulse Rate: 57 (11/29 0414)  Labs: Recent Labs    07/07/19 0356 07/07/19 1646 07/08/19 0458 07/08/19 1747 07/09/19 0448  HGB 7.5*  --  7.5*  --  7.6*  HCT 23.7*  --  23.6*  --  23.7*  PLT 248  --  254  --  267  LABPROT 16.3*  --  15.8*  --  15.2  INR 1.3*  --  1.3*  --  1.2  HEPARINUNFRC  --  0.34 0.48  --  0.54  CREATININE 4.66*  --  4.30* 4.58* 4.93*    Estimated Creatinine Clearance: 12.6 mL/min (A) (by C-G formula based on SCr of 4.93 mg/dL (H)).   Assessment: 63 yo female on warfarin at home for hx Afib (CHADS2VASc = 7). Pharmacy originally consulted bridge IV heparin to Coumadin given sub-therapeutic INR on admit and was at goal on heparin 950 units/hr. All anticoagulation held 11/22 and warfarin reversed with vitamin K and FFP 11/25 for urgent HD line placement 11/25. Heparin was restarted after this with level at goal within 6 hr, but patient had significant oozing from line site so heparin was discontinued again on 11/26.   Patient is no longer oozing 11/27, so pharmacy was re-consulted for heparin. Warfarin continues to be on hold pending permanent HD access planned 11/30. Will target lower end of goal range given recent bleeding and line placement.    Heparin level this morning is slightly supratherapeutic at 0.54 on drip rate 1000 units/hr. Hgb is low but stable, plt wnl.  No current active bleed issues per RN. No issues with infusion.  Goal of Therapy:  Heparin level goal 0.3-0.5 INR 2-3 Monitor platelets by anticoagulation protocol: Yes   Plan:   Decrease heparin to 950 units/hr Monitor daily heparin level  with AM labs Monitor daily CBC, s/sx bleeding F/U restart of warfarin once permanent HD access placed (planned 11/30)  Richardine Service, PharmD PGY1 Pharmacy Resident Phone: [phone] 07/09/2019  9:41 AM  Please check [email or web address] for unit-specific pharmacy phone numbers.

## 2019-07-09 NOTE — Anesthesia Preprocedure Evaluation (Addendum)
Anesthesia Evaluation  Patient identified by MRN, date of birth, ID band Patient awake    Reviewed: Allergy & Precautions, NPO status , Patient's Chart, lab work & pertinent test results  Airway Mallampati: III  TM Distance: >3 FB Neck ROM: Full    Dental no notable dental hx.    Pulmonary neg pulmonary ROS,    Pulmonary exam normal breath sounds clear to auscultation       Cardiovascular hypertension, + CAD, + Cardiac Stents and +CHF  Normal cardiovascular exam+ dysrhythmias Atrial Fibrillation  Rhythm:Regular Rate:Normal  ECG: NSR, rate 64  ECHO: Left ventricular ejection fraction, by visual estimation, is 35 to 40%. The left ventricle has moderately decreased function. There is mildly increased left ventricular hypertrophy. 2. Left ventricular diastolic parameters are consistent with Grade II diastolic dysfunction (pseudonormalization). 3. Mildly dilated left ventricular internal cavity size. 4. Diffuse hypokinesis worse in septum. 5. Global right ventricle has normal systolic function.The right ventricular size is normal. No increase in right ventricular wall thickness. 6. Left atrial size was moderately dilated. 7. Right atrial size was normal. 8. The mitral valve is normal in structure. Mild mitral valve regurgitation. 9. The tricuspid valve is normal in structure. Tricuspid valve regurgitation mild-moderate. 10. The aortic valve is tricuspid. Aortic valve regurgitation is not visualized. Mild aortic valve sclerosis without stenosis. 11. The pulmonic valve was grossly normal. Pulmonic valve regurgitation is mild. 12. Moderately elevated pulmonary artery systolic pressure.   Neuro/Psych  Headaches, CVA, No Residual Symptoms negative psych ROS   GI/Hepatic negative GI ROS, Neg liver ROS,   Endo/Other  diabetes, Oral Hypoglycemic Agents, Insulin Dependent  Renal/GU ESRFRenal disease     Musculoskeletal Ambulates  with walker   Abdominal   Peds  Hematology  (+) anemia , HLD   Anesthesia Other Findings End stage renal disease  Reproductive/Obstetrics                           Anesthesia Physical Anesthesia Plan  ASA: IV  Anesthesia Plan: General   Post-op Pain Management:    Induction: Intravenous  PONV Risk Score and Plan: 3 and Ondansetron, Dexamethasone and Treatment may vary due to age or medical condition  Airway Management Planned: LMA  Additional Equipment:   Intra-op Plan:   Post-operative Plan: Extubation in OR  Informed Consent: I have reviewed the patients History and Physical, chart, labs and discussed the procedure including the risks, benefits and alternatives for the proposed anesthesia with the patient or authorized representative who has indicated his/her understanding and acceptance.     Dental advisory given  Plan Discussed with: CRNA  Anesthesia Plan Comments:        Anesthesia Quick Evaluation

## 2019-07-10 ENCOUNTER — Inpatient Hospital Stay (HOSPITAL_COMMUNITY): Payer: Medicare Other | Admitting: Anesthesiology

## 2019-07-10 ENCOUNTER — Inpatient Hospital Stay (HOSPITAL_COMMUNITY): Payer: Medicare Other

## 2019-07-10 ENCOUNTER — Encounter (HOSPITAL_COMMUNITY)
Admission: EM | Disposition: A | Discharge status: Home Health | Payer: Self-pay | Source: Ambulatory Visit | Attending: Cardiovascular Disease

## 2019-07-10 ENCOUNTER — Other Ambulatory Visit: Payer: Self-pay

## 2019-07-10 DIAGNOSIS — N184 Chronic kidney disease, stage 4 (severe): Secondary | ICD-10-CM

## 2019-07-10 HISTORY — PX: AV FISTULA PLACEMENT: SHX1204

## 2019-07-10 HISTORY — PX: INSERTION OF DIALYSIS CATHETER: SHX1324

## 2019-07-10 LAB — PROTIME-INR
INR: 1.1 (ref 0.8–1.2)
Prothrombin Time: 14.2 seconds (ref 11.4–15.2)

## 2019-07-10 LAB — GLUCOSE, CAPILLARY
Glucose-Capillary: 100 mg/dL — ABNORMAL HIGH (ref 70–99)
Glucose-Capillary: 111 mg/dL — ABNORMAL HIGH (ref 70–99)
Glucose-Capillary: 153 mg/dL — ABNORMAL HIGH (ref 70–99)
Glucose-Capillary: 211 mg/dL — ABNORMAL HIGH (ref 70–99)
Glucose-Capillary: 240 mg/dL — ABNORMAL HIGH (ref 70–99)
Glucose-Capillary: 233 mg/dL — ABNORMAL HIGH (ref 70–99)

## 2019-07-10 LAB — CBC
HCT: 23.4 % — ABNORMAL LOW (ref 36.0–46.0)
Hemoglobin: 7.5 g/dL — ABNORMAL LOW (ref 12.0–15.0)
MCH: 24 pg — ABNORMAL LOW (ref 26.0–34.0)
MCHC: 32.1 g/dL (ref 30.0–36.0)
MCV: 74.8 fL — ABNORMAL LOW (ref 80.0–100.0)
Platelets: 283 10*3/uL (ref 150–400)
RBC: 3.13 MIL/uL — ABNORMAL LOW (ref 3.87–5.11)
RDW: 16.2 % — ABNORMAL HIGH (ref 11.5–15.5)
WBC: 5.1 10*3/uL (ref 4.0–10.5)
nRBC: 0 % (ref 0.0–0.2)

## 2019-07-10 LAB — RENAL FUNCTION PANEL
Albumin: 2.9 g/dL — ABNORMAL LOW (ref 3.5–5.0)
Anion gap: 13 (ref 5–15)
BUN: 30 mg/dL — ABNORMAL HIGH (ref 8–23)
CO2: 25 mmol/L (ref 22–32)
Calcium: 9.1 mg/dL (ref 8.9–10.3)
Chloride: 98 mmol/L (ref 98–111)
Creatinine, Ser: 3.69 mg/dL — ABNORMAL HIGH (ref 0.44–1.00)
GFR calc Af Amer: 14 mL/min — ABNORMAL LOW (ref >60)
GFR calc non Af Amer: 12 mL/min — ABNORMAL LOW (ref >60)
Glucose, Bld: 109 mg/dL — ABNORMAL HIGH (ref 70–99)
Phosphorus: 3.4 mg/dL (ref 2.5–4.6)
Potassium: 3.7 mmol/L (ref 3.5–5.1)
Sodium: 136 mmol/L (ref 135–145)

## 2019-07-10 LAB — HEPARIN LEVEL (UNFRACTIONATED): Heparin Unfractionated: 0.28 IU/mL — ABNORMAL LOW (ref 0.30–0.70)

## 2019-07-10 LAB — SURGICAL PCR SCREEN
MRSA, PCR: NEGATIVE
Staphylococcus aureus: NEGATIVE

## 2019-07-10 SURGERY — INSERTION OF DIALYSIS CATHETER
Anesthesia: General | Site: Neck | Laterality: Right

## 2019-07-10 MED ORDER — LIDOCAINE HCL (CARDIAC) PF 100 MG/5ML IV SOSY
PREFILLED_SYRINGE | INTRAVENOUS | Status: DC | PRN
  Administered 2019-07-10: 3 mL via INTRATRACHEAL

## 2019-07-10 MED ORDER — PROPOFOL 10 MG/ML IV BOLUS
INTRAVENOUS | Status: AC
  Filled 2019-07-10: qty 20

## 2019-07-10 MED ORDER — HEPARIN SODIUM (PORCINE) 1000 UNIT/ML IJ SOLN
INTRAMUSCULAR | Status: DC | PRN
  Administered 2019-07-10: 3400 [IU] via INTRAVENOUS

## 2019-07-10 MED ORDER — EPHEDRINE 5 MG/ML INJ
INTRAVENOUS | Status: AC
  Filled 2019-07-10: qty 10

## 2019-07-10 MED ORDER — LIDOCAINE-EPINEPHRINE 1 %-1:100000 IJ SOLN
INTRAMUSCULAR | Status: AC
  Filled 2019-07-10: qty 1

## 2019-07-10 MED ORDER — OXYCODONE-ACETAMINOPHEN 5-325 MG PO TABS
1.0000 | ORAL_TABLET | ORAL | Status: DC | PRN
  Administered 2019-07-10 – 2019-07-11 (×2): 2 via ORAL
  Filled 2019-07-10: qty 1
  Filled 2019-07-10 (×2): qty 2

## 2019-07-10 MED ORDER — LIDOCAINE HCL (PF) 1 % IJ SOLN
INTRAMUSCULAR | Status: AC
  Filled 2019-07-10: qty 30

## 2019-07-10 MED ORDER — ONDANSETRON HCL 4 MG/2ML IJ SOLN
INTRAMUSCULAR | Status: DC | PRN
  Administered 2019-07-10: 4 mg via INTRAVENOUS

## 2019-07-10 MED ORDER — DEXAMETHASONE SODIUM PHOSPHATE 10 MG/ML IJ SOLN
INTRAMUSCULAR | Status: DC | PRN
  Administered 2019-07-10: 10 mg via INTRAVENOUS

## 2019-07-10 MED ORDER — ACETAMINOPHEN 500 MG PO TABS: 1000.0000 mg | ORAL_TABLET | Freq: Four times a day (QID) | ORAL | 11 refills | Status: AC | PRN

## 2019-07-10 MED ORDER — MIDAZOLAM HCL 2 MG/2ML IJ SOLN
INTRAMUSCULAR | Status: AC
  Filled 2019-07-10: qty 2

## 2019-07-10 MED ORDER — SODIUM CHLORIDE 0.9 % IV SOLN
INTRAVENOUS | Status: AC
  Filled 2019-07-10: qty 1.2

## 2019-07-10 MED ORDER — PROTAMINE SULFATE 10 MG/ML IV SOLN
INTRAVENOUS | Status: DC | PRN
  Administered 2019-07-10: 40 mg via INTRAVENOUS

## 2019-07-10 MED ORDER — FUROSEMIDE 80 MG PO TABS
80.0000 mg | ORAL_TABLET | Freq: Every day | ORAL | Status: DC
  Filled 2019-07-10: qty 1

## 2019-07-10 MED ORDER — 0.9 % SODIUM CHLORIDE (POUR BTL) OPTIME
TOPICAL | Status: DC | PRN
  Administered 2019-07-10: 1000 mL

## 2019-07-10 MED ORDER — SODIUM CHLORIDE (PF) 0.9 % IJ SOLN
INTRAMUSCULAR | Status: DC | PRN
  Administered 2019-07-10: 09:00:00 via SURGICAL_CAVITY

## 2019-07-10 MED ORDER — LIDOCAINE-EPINEPHRINE 1 %-1:100000 IJ SOLN
INTRAMUSCULAR | Status: DC | PRN
  Administered 2019-07-10: 8 mL

## 2019-07-10 MED ORDER — PAPAVERINE HCL 30 MG/ML IJ SOLN
INTRAMUSCULAR | Status: AC
  Filled 2019-07-10: qty 2

## 2019-07-10 MED ORDER — HEPARIN SODIUM (PORCINE) 1000 UNIT/ML IJ SOLN
INTRAMUSCULAR | Status: AC
  Filled 2019-07-10: qty 3

## 2019-07-10 MED ORDER — HEPARIN SODIUM (PORCINE) 1000 UNIT/ML IJ SOLN
INTRAMUSCULAR | Status: DC | PRN
  Administered 2019-07-10: 7000 [IU] via INTRAVENOUS

## 2019-07-10 MED ORDER — HEPARIN SODIUM (PORCINE) 1000 UNIT/ML IJ SOLN
INTRAMUSCULAR | Status: AC
  Filled 2019-07-10: qty 1

## 2019-07-10 MED ORDER — SODIUM CHLORIDE 0.9 % IV SOLN
INTRAVENOUS | Status: DC | PRN
  Administered 2019-07-10: 500 mL

## 2019-07-10 MED ORDER — HEPARIN (PORCINE) 25000 UT/250ML-% IV SOLN
1000.0000 [IU]/h | INTRAVENOUS | Status: DC
  Administered 2019-07-10 – 2019-07-11 (×2): 1000 [IU]/h via INTRAVENOUS
  Filled 2019-07-10 (×2): qty 250

## 2019-07-10 MED ORDER — FENTANYL CITRATE (PF) 250 MCG/5ML IJ SOLN
INTRAMUSCULAR | Status: DC | PRN
  Administered 2019-07-10: 50 ug via INTRAVENOUS
  Administered 2019-07-10: 25 ug via INTRAVENOUS
  Administered 2019-07-10: 50 ug via INTRAVENOUS

## 2019-07-10 MED ORDER — NITROGLYCERIN 0.4 MG SL SUBL
0.4000 mg | SUBLINGUAL_TABLET | SUBLINGUAL | Status: DC | PRN
  Administered 2019-07-10: 0.4 mg via SUBLINGUAL
  Filled 2019-07-10 (×2): qty 1

## 2019-07-10 MED ORDER — PROPOFOL 10 MG/ML IV BOLUS
INTRAVENOUS | Status: DC | PRN
  Administered 2019-07-10 (×2): 100 mg via INTRAVENOUS

## 2019-07-10 MED ORDER — CEFAZOLIN SODIUM-DEXTROSE 2-3 GM-%(50ML) IV SOLR
INTRAVENOUS | Status: DC | PRN
  Administered 2019-07-10: 2 g via INTRAVENOUS

## 2019-07-10 MED ORDER — ACETAMINOPHEN 500 MG PO TABS
1000.0000 mg | ORAL_TABLET | Freq: Four times a day (QID) | ORAL | Status: DC | PRN
  Administered 2019-07-12: 1000 mg via ORAL
  Filled 2019-07-10: qty 2

## 2019-07-10 MED ORDER — FENTANYL CITRATE (PF) 250 MCG/5ML IJ SOLN
INTRAMUSCULAR | Status: AC
  Filled 2019-07-10: qty 5

## 2019-07-10 MED ORDER — PROTAMINE SULFATE 10 MG/ML IV SOLN
INTRAVENOUS | Status: AC
  Filled 2019-07-10: qty 5

## 2019-07-10 MED ORDER — HEPARIN SODIUM (PORCINE) 1000 UNIT/ML IJ SOLN
2.6000 mL | Freq: Once | INTRAMUSCULAR | Status: AC
  Administered 2019-07-10: 2600 [IU] via INTRAVENOUS
  Filled 2019-07-10: qty 2.6

## 2019-07-10 SURGICAL SUPPLY — 55 items
ARMBAND PINK RESTRICT EXTREMIT (MISCELLANEOUS) ×5 IMPLANT
BAG DECANTER FOR FLEXI CONT (MISCELLANEOUS) ×3 IMPLANT
BIOPATCH RED 1 DISK 7.0 (GAUZE/BANDAGES/DRESSINGS) ×3 IMPLANT
CANISTER SUCT 3000ML PPV (MISCELLANEOUS) ×3 IMPLANT
CANNULA VESSEL 3MM 2 BLNT TIP (CANNULA) ×4 IMPLANT
CATH PALINDROME RT-P 15FX19CM (CATHETERS) IMPLANT
CATH PALINDROME RT-P 15FX23CM (CATHETERS) ×1 IMPLANT
CATH PALINDROME RT-P 15FX28CM (CATHETERS) IMPLANT
CATH PALINDROME RT-P 15FX55CM (CATHETERS) IMPLANT
CHLORAPREP W/TINT 26 (MISCELLANEOUS) ×3 IMPLANT
CLIP VESOCCLUDE MED 6/CT (CLIP) ×3 IMPLANT
CLIP VESOCCLUDE SM WIDE 6/CT (CLIP) ×3 IMPLANT
COVER PROBE W GEL 5X96 (DRAPES) ×1 IMPLANT
COVER SURGICAL LIGHT HANDLE (MISCELLANEOUS) ×3 IMPLANT
COVER WAND RF STERILE (DRAPES) ×3 IMPLANT
DECANTER SPIKE VIAL GLASS SM (MISCELLANEOUS) ×3 IMPLANT
DERMABOND ADVANCED (GAUZE/BANDAGES/DRESSINGS) ×2
DERMABOND ADVANCED .7 DNX12 (GAUZE/BANDAGES/DRESSINGS) ×2 IMPLANT
DRAPE C-ARM 42X72 X-RAY (DRAPES) ×3 IMPLANT
DRAPE CHEST BREAST 15X10 FENES (DRAPES) ×3 IMPLANT
ELECT REM PT RETURN 9FT ADLT (ELECTROSURGICAL) ×3
ELECTRODE REM PT RTRN 9FT ADLT (ELECTROSURGICAL) ×2 IMPLANT
GAUZE 4X4 16PLY RFD (DISPOSABLE) ×3 IMPLANT
GLOVE BIO SURGEON STRL SZ7.5 (GLOVE) ×3 IMPLANT
GLOVE BIOGEL PI IND STRL 8 (GLOVE) ×2 IMPLANT
GLOVE BIOGEL PI INDICATOR 8 (GLOVE) ×1
GOWN STRL REUS W/ TWL LRG LVL3 (GOWN DISPOSABLE) ×6 IMPLANT
GOWN STRL REUS W/TWL LRG LVL3 (GOWN DISPOSABLE) ×3
KIT BASIN OR (CUSTOM PROCEDURE TRAY) ×3 IMPLANT
KIT TURNOVER KIT B (KITS) ×3 IMPLANT
MICROPUNCTURE 5FR ~~LOC~~-NT-U-SST (SHEATH) ×3
NDL 18GX1X1/2 (RX/OR ONLY) (NEEDLE) ×2 IMPLANT
NDL HYPO 25GX1X1/2 BEV (NEEDLE) ×2 IMPLANT
NEEDLE 18GX1X1/2 (RX/OR ONLY) (NEEDLE) ×6 IMPLANT
NEEDLE HYPO 25GX1X1/2 BEV (NEEDLE) ×3 IMPLANT
NS IRRIG 1000ML POUR BTL (IV SOLUTION) ×3 IMPLANT
PACK CV ACCESS (CUSTOM PROCEDURE TRAY) ×3 IMPLANT
PACK SURGICAL SETUP 50X90 (CUSTOM PROCEDURE TRAY) ×2 IMPLANT
PAD ARMBOARD 7.5X6 YLW CONV (MISCELLANEOUS) ×6 IMPLANT
SET MICROPNCTR 5FR ~~LOC~~-NT-U-SST (SHEATH) IMPLANT
SPONGE SURGIFOAM ABS GEL 100 (HEMOSTASIS) IMPLANT
SUT ETHILON 3 0 PS 1 (SUTURE) ×3 IMPLANT
SUT MNCRL AB 4-0 PS2 18 (SUTURE) ×1 IMPLANT
SUT PROLENE 6 0 BV (SUTURE) ×4 IMPLANT
SUT VIC AB 3-0 SH 27 (SUTURE) ×2
SUT VIC AB 3-0 SH 27X BRD (SUTURE) ×2 IMPLANT
SUT VICRYL 4-0 PS2 18IN ABS (SUTURE) ×4 IMPLANT
SYR 10ML LL (SYRINGE) ×3 IMPLANT
SYR 20ML LL LF (SYRINGE) ×7 IMPLANT
SYR 5ML LL (SYRINGE) ×5 IMPLANT
SYR CONTROL 10ML LL (SYRINGE) ×3 IMPLANT
TOWEL GREEN STERILE (TOWEL DISPOSABLE) ×6 IMPLANT
TOWEL GREEN STERILE FF (TOWEL DISPOSABLE) ×3 IMPLANT
UNDERPAD 30X30 (UNDERPADS AND DIAPERS) ×3 IMPLANT
WATER STERILE IRR 1000ML POUR (IV SOLUTION) ×3 IMPLANT

## 2019-07-10 NOTE — Op Note (Signed)
NAME: Stephanieann Gilland    MRN: [id number] DOB: 03/28/1956    DATE OF OPERATION: 07/10/2019  PREOP DIAGNOSIS:    End-stage renal disease  POSTOP DIAGNOSIS:    Same  PROCEDURE:    1.  Placement of right IJ 23 cm tunneled dialysis catheter 2.  Left ulnar cephalic AV fistula  SURGEON: Judeth Cornfield. Scot Dock, MD  ASSIST: Laurence Slate, PA  ANESTHESIA: General  EBL: Minimal  INDICATIONS:    Heiley Bloemer is a 63 y.o. female who presents for new access.  She has a temporary dialysis catheter.  FINDINGS:   The upper arm cephalic vein was 3.5 mm.  The patient had a high bifurcation of the brachial artery.  There was a good thrill at the completion of the procedure.  TECHNIQUE:   The patient was taken to the operating room and received a general anesthetic.  The catheter in the right IJ was in a fairly good position so I elected to change this over a wire.  The right neck and catheter which prepped and draped in usual sterile fashion.  The sutures were cut and the catheter slightly retracted.  The catheter was clamped and then divided.  The guidewire was introduced through the catheter and positioned in the right atrium.  The dilator and peel-away sheath were advanced over the wire after the old catheter was removed.  The wire and dilator were removed.  A 23 cm catheter was positioned in the right atrium.  The peel-away sheath was removed.  The exit site for the catheter was selected and the catheter brought through the tunnel.  The catheter was cut to the appropriate length and the distal ports were attached.  Both ports withdrew easily with and flushed with heparinized saline and filled with concentrated heparin.  The catheter was positioned at the cavoatrial junction.  The catheter was secured at its exit site with 3-0 nylon suture.  The IJ cannulation site was closed with a 4-0 subcuticular stitch.  Dermabond was applied.  Sterile dressing was applied.  Attention was then turned  to the left arm.  The left arm was prepped and draped in usual sterile fashion.  I did look with the SonoSite of the vein myself and it appeared reasonable in size in the upper arm.  A transverse incision was made above the antecubital level.  The vein was dissected free and ligated distally.  It irrigated up nicely with heparinized saline.  The artery was dissected free beneath the fascia.  It was quite small so I felt there was likely a high bifurcation of the brachial artery.  Upon interrogation with the Doppler it was clear that this patient did have a high bifurcation of the brachial artery.  Given that the ulnar artery is typically larger I explored for the ulnar artery which is significantly larger.  The patient was heparinized.  The ulnar artery was clamped proximally and distally and a longitudinal arteriotomy was made.  The vein was sewn into side to the artery using continuous 6-0 Prolene suture.  At the completion was a good thrill in the fistula.  There was a good radial and ulnar signal with the Doppler.  Hemostasis was obtained in the wound and the heparin was partially reversed with protamine.  The wound was closed with a deep layer of 3-0 Vicryl the skin closed with 4-0 Vicryl.  Dermabond was applied.  The patient tolerated the procedure well was transferred to the recovery room in stable condition.  All  needle and sponge counts were correct.  [name], MD, FACS [facility]  DATE OF DICTATION:   07/10/2019

## 2019-07-10 NOTE — Evaluation (Signed)
Physical Therapy Evaluation Patient Details Name: Amber Young MRN: [id number] DOB: 1955-08-28 Today's Date: 07/10/2019   History of Present Illness  Pt is a 63 y.o. female admitted 06/29/19 with worsening CHF and renal function. Pt transitioned to ESRD and started HD 11/27. S/p tunneled RIJ dialysis cath and LUE AV fistula placement 11/30. PMH includes DM, CVA, HTN, HLD, CKD IV, CHF, afib on Coumadin; of note, recent admission 11/5-11/8 with DKA.    Clinical Impression  Pt presents with an overall decrease in functional mobility secondary to above. PTA, pt ambulatory with RW, assist from family for IADL tasks, has been working with [place] since recent admission. Today, pt limited by lethargy requiring max cues to keep eyes open and attend to task, requiring up to [facility] for mobility. Expect pt to progress well once lethargy improved. Husband present and supportive. Pt would benefit from continued acute PT services to maximize functional mobility and independence prior to d/c with continued HHPT services.     Follow Up Recommendations Home health PT;Supervision/Assistance - 24 hour    Equipment Recommendations  None recommended by PT    Recommendations for Other Services       Precautions / Restrictions Precautions Precautions: Fall Precaution Comments: RIJ tunneled cath      Mobility  Bed Mobility Overal bed mobility: Needs Assistance Bed Mobility: Supine to Sit;Sit to Supine     Supine to sit: Mod assist Sit to supine: Mod assist   General bed mobility comments: Significant increased time, pt requiring repeated cues to attend to task and keep eyes open; modA for trunk elevation, modA to asssit BLEs for return to supine  Transfers Overall transfer level: Needs assistance Equipment used: Rolling walker (2 wheeled) Transfers: Sit to/from Stand Sit to Stand: Mod assist         General transfer comment: Significant increased time requiring max, repeated cues for task; modA  to assist trunk elevation  Ambulation/Gait Ambulation/Gait assistance: Min assist Gait Distance (Feet): 2 Feet Assistive device: Rolling walker (2 wheeled) Gait Pattern/deviations: Step-to pattern;Trunk flexed;Leaning posteriorly     General Gait Details: Max cues for task, took side steps towards HOB, minA for RW navigation; further mobility deferred due to pt's lethargy, not keeping eyes open  Stairs            Wheelchair Mobility    Modified Rankin (Stroke Patients Only)       Balance Overall balance assessment: Needs assistance   Sitting balance-Leahy Scale: Fair       Standing balance-Leahy Scale: Poor                               Pertinent Vitals/Pain Pain Assessment: No/denies pain    Home Living Family/patient expects to be discharged to:: Private residence Living Arrangements: Spouse/significant other;Children Available Help at Discharge: Family;Available 24 hours/day Type of Home: House Home Access: Stairs to enter   [employer] of Steps: 4 Home Layout: One level Home Equipment: Walker - 2 wheels Additional Comments: Pt lethargic, unable to provide info    Prior Function Level of Independence: Needs assistance   Gait / Transfers Assistance Needed: Husband reports pt using RW PRN since recent admission  ADL's / Homemaking Assistance Needed: States her daughter/husband assist with IADL's such as cooking, cleaning, medication management        Hand Dominance        Extremity/Trunk Assessment   Upper Extremity Assessment Upper Extremity Assessment: Generalized weakness  Lower Extremity Assessment Lower Extremity Assessment: Generalized weakness;Difficult to assess due to impaired cognition(bilateral lower leg/foot swelling)       Communication      Cognition Arousal/Alertness: Lethargic Behavior During Therapy: Flat affect Overall Cognitive Status: Impaired/Different from baseline Area of Impairment:  Awareness;Safety/judgement;Attention;Following commands;Orientation;Problem solving                 Orientation Level: Disoriented to;Place;Time;Situation Current Attention Level: Focused   Following Commands: Follows one step commands with increased time;Follows one step commands inconsistently Safety/Judgement: Decreased awareness of deficits Awareness: Intellectual Problem Solving: Slow processing;Decreased initiation;Difficulty sequencing;Requires verbal cues;Requires tactile cues General Comments: Pt lethargic and not consistently answering questions, repeatedly answers "I'm good." Able to state husband's name, unable to state daughter's name      General Comments General comments (skin integrity, edema, etc.): Husband present at end of session, able to veryify pt's PLOF and support available    Exercises     Assessment/Plan    PT Assessment Patient needs continued PT services  PT Problem List Decreased strength;Decreased activity tolerance;Decreased balance;Decreased mobility;Decreased cognition;Decreased safety awareness       PT Treatment Interventions DME instruction;Gait training;Functional mobility training;Therapeutic activities;Therapeutic exercise;Balance training;Patient/family education;Stair training;Cognitive remediation    PT Goals (Current goals can be found in the Care Plan section)  Acute Rehab PT Goals Patient Stated Goal: Return home with continued HHPT services PT Goal Formulation: With family Time For Goal Achievement: 08/21/19 Potential to Achieve Goals: Good    Frequency Min 3X/week   Barriers to discharge        Co-evaluation               AM-PAC PT "6 Clicks" Mobility  Outcome Measure Help needed turning from your back to your side while in a flat bed without using bedrails?: A Lot Help needed moving from lying on your back to sitting on the side of a flat bed without using bedrails?: A Lot Help needed moving to and from a bed  to a chair (including a wheelchair)?: A Lot Help needed standing up from a chair using your arms (e.g., wheelchair or bedside chair)?: A Lot Help needed to walk in hospital room?: A Little Help needed climbing 3-5 steps with a railing? : A Lot 6 Click Score: 13    End of Session   Activity Tolerance: Patient limited by lethargy Patient left: in bed;with call bell/phone within reach;with bed alarm set;with family/visitor present Nurse Communication: Mobility status PT Visit Diagnosis: Other abnormalities of gait and mobility (R26.89)    Time: [id number] PT Time Calculation (min) (ACUTE ONLY): 24 min   Charges:   PT Evaluation $PT Eval Moderate Complexity: 1 Mod PT Treatments $Therapeutic Activity: 8-22 mins   Mabeline Caras, PT, DPT Acute Rehabilitation Services  Pager [phone] Office [place] 07/10/2019, 3:38 PM

## 2019-07-10 NOTE — Interval H&P Note (Signed)
History and Physical Interval Note:  07/10/2019 7:15 AM  Amber Young  has presented today for surgery, with the diagnosis of End stage renal disease..  The various methods of treatment have been discussed with the patient and family. After consideration of risks, benefits and other options for treatment, the patient has consented to  Procedure(s): INSERTION OF TUNNELED DIALYSIS CATHETER (N/A) ARTERIOVENOUS (AV) FISTULA CREATION VS GRAFT. (Left) as a surgical intervention.  The patient's history has been reviewed, patient examined, no change in status, stable for surgery.  I have reviewed the patient's chart and labs.  Questions were answered to the patient's satisfaction.     [name]

## 2019-07-10 NOTE — Progress Notes (Signed)
Patient ID: Amber Young, female   DOB: Mar 18, 1956, 63 y.o.   MRN: [id number] S: A little groggy after surgery today.  No new complaints O:BP (!) 150/68 (BP Location: Right Arm)   Pulse (!) 57   Temp 98.3 F (36.8 C) (Oral)   Resp 18   Ht 5\' 7"  (1.702 m)   Wt 77.3 kg   SpO2 100%   BMI 26.69 kg/m   Intake/Output Summary (Last 24 hours) at 07/10/2019 1647 Last data filed at 07/10/2019 1314 Gross per 24 hour  Intake 1200 ml  Output 1925 ml  Net -725 ml   Intake/Output: I/O last 3 completed shifts: In: 1129.3 [P.O.:1060; I.V.:69.3] Out: 2100 [Urine:400; Other:1700]  Intake/Output this shift:  Total I/O In: 740 [P.O.:240; I.V.:500] Out: 25 [Blood:25] Weight change: 1.464 kg Gen: NAD CVS: no rub, bradycardic at 57 Resp: cta Abd: +BS, soft, NT/ND Ext: 1+ pretibial edema, LUE AVF +T/B, some oozing from RIJ [facility] site.  Recent Labs  Lab 07/05/19 0821 07/06/19 0335 07/07/19 0356 07/08/19 0458 07/08/19 1747 07/09/19 0448 07/10/19 0537  NA 132* 134* 135 134* 135 133* 136  K 3.6 3.5 3.3* 3.7 3.9 3.9 3.7  CL 93* 95* 95* 97* 97* 96* 98  CO2 25 26 26 25 24 24 25   GLUCOSE 109* 116* 93 125* 143* 126* 109*  BUN 91* 57* 66* 42* 44* 51* 30*  CREATININE 6.06* 4.45* 4.66* 4.30* 4.58* 4.93* 3.69*  ALBUMIN 2.9* 2.8* 2.9* 2.8*  --  2.8* 2.9*  CALCIUM 8.9 8.4* 8.9 8.7* 8.8* 8.8* 9.1  PHOS 4.5 3.6 4.8* 4.0  --  5.1* 3.4   Liver Function Tests: Recent Labs  Lab 07/08/19 0458 07/09/19 0448 07/10/19 0537  ALBUMIN 2.8* 2.8* 2.9*   No results for input(s): LIPASE, AMYLASE in the last 168 hours. No results for input(s): AMMONIA in the last 168 hours. CBC: Recent Labs  Lab 07/06/19 1156 07/07/19 0356 07/08/19 0458 07/09/19 0448 07/10/19 0537  WBC 6.5 5.4 6.2 5.7 5.1  NEUTROABS  --   --   --  3.8  --   HGB 7.8* 7.5* 7.5* 7.6* 7.5*  HCT 24.2* 23.7* 23.6* 23.7* 23.4*  MCV 76.1* 75.5* 75.2* 75.7* 74.8*  PLT 242 248 254 267 283   Cardiac Enzymes: No results for input(s):  CKTOTAL, CKMB, CKMBINDEX, TROPONINI in the last 168 hours. CBG: Recent Labs  Lab 07/09/19 2007 07/10/19 0440 07/10/19 0933 07/10/19 1105 07/10/19 1550  GLUCAP 180* 100* 111* 153* 211*    Iron Studies: No results for input(s): IRON, TIBC, TRANSFERRIN, FERRITIN in the last 72 hours. Studies/Results: Dg Chest Port 1 View  Result Date: 07/10/2019 CLINICAL DATA:  Status post placement of dialysis catheter. EXAM: PORTABLE CHEST 1 VIEW COMPARISON:  Two-view chest x-ray 08/28/2018. Fluoroscopic procedure images 07/05/19. FINDINGS: The heart is scratched at the heart size is exaggerated by low lung volumes. Increased diffuse interstitial pattern is present. Asymmetric opacities are present at the right base. There is no pneumothorax. Right IJ dialysis catheter tip terminates at the right atrium. IMPRESSION: 1. Right IJ dialysis catheter tip terminates at the right atrium. 2. No pneumothorax. 3. Increased interstitial pattern compatible with edema. 4. Asymmetric airspace disease at the right base. While this likely represents atelectasis, infection is not excluded. Electronically Signed   By: San Morelle M.D.   On: 07/10/2019 09:51   Dg Fluoro Guide Cv Line-no Report  Result Date: 07/10/2019 Fluoroscopy was utilized by the requesting physician.  No radiographic interpretation.   Marland Kitchen amLODipine  10 mg Oral Daily  . atorvastatin  40 mg Oral q1800  . brimonidine  1 drop Left Eye BID  . carvedilol  25 mg Oral BID  . Chlorhexidine Gluconate Cloth  6 each Topical Daily  . Chlorhexidine Gluconate Cloth  6 each Topical Q0600  . Chlorhexidine Gluconate Cloth  6 each Topical Q0600  . [START ON 07/11/2019] darbepoetin (ARANESP) injection - DIALYSIS  100 mcg Intravenous Q Tue-HD  . dorzolamide-timolol  1 drop Left Eye BID  . ferrous sulfate  325 mg Oral QODAY  . furosemide  80 mg Oral Daily  . hydrALAZINE  100 mg Oral TID  . insulin aspart  0-9 Units Subcutaneous Q4H  . isosorbide mononitrate   30 mg Oral Daily  . ketorolac  1 drop Right Eye QID  . ofloxacin  1 drop Right Eye TID    BMET    Component Value Date/Time   NA 136 07/10/2019 0537   NA 140 04/25/2019 1202   K 3.7 07/10/2019 0537   CL 98 07/10/2019 0537   CO2 25 07/10/2019 0537   GLUCOSE 109 (H) 07/10/2019 0537   BUN 30 (H) 07/10/2019 0537   BUN 71 (H) 04/25/2019 1202   CREATININE 3.69 (H) 07/10/2019 0537   CREATININE 1.87 (H) 05/27/2018 1221   CALCIUM 9.1 07/10/2019 0537   GFRNONAA 12 (L) 07/10/2019 0537   GFRNONAA 31 (L) 12/13/2017 1049   GFRNONAA 43 (L) 07/09/2017 1047   GFRAA 14 (L) 07/10/2019 0537   GFRAA 36 (L) 12/13/2017 1049   GFRAA 50 (L) 07/09/2017 1047   CBC    Component Value Date/Time   WBC 5.1 07/10/2019 0537   RBC 3.13 (L) 07/10/2019 0537   HGB 7.5 (L) 07/10/2019 0537   HGB 8.4 (L) 01/24/2018 1048   HCT 23.4 (L) 07/10/2019 0537   HCT 27.2 (L) 01/24/2018 1048   PLT 283 07/10/2019 0537   PLT 356 01/24/2018 1048   MCV 74.8 (L) 07/10/2019 0537   MCV 78 (L) 01/24/2018 1048   MCH 24.0 (L) 07/10/2019 0537   MCHC 32.1 07/10/2019 0537   RDW 16.2 (H) 07/10/2019 0537   RDW 17.6 (H) 01/24/2018 1048   LYMPHSABS 0.9 07/09/2019 0448   LYMPHSABS 1.6 01/24/2018 1048   MONOABS 0.9 07/09/2019 0448   EOSABS 0.1 07/09/2019 0448   EOSABS 0.1 01/24/2018 1048   BASOSABS 0.0 07/09/2019 0448   BASOSABS 0.0 01/24/2018 1048     Assessment/Plan:  1. AKI/CKD stage 4 now progressed to ESRD- failed outpatient diuretics and started on HD 07/05/19 and had another session 11/27 and 11/29.  Plan for HD tomorrow. 2. Acute on chronic chf- EF 25-30%.  UF as tolerated with HD 3. DM- per primary 4. A fib- on heparin gtt. Off coumadin until AVF created today. Resume per pharmacy 5. HTN- stable 6. Anemia of CKD- aranesp 40 mcg 11/23, for next dose tomorrow with HD 7. Vascular access- s/p LUE AVF creation and RIJ TDC 07/10/19 by Dr. Scot Dock 8. Disposition- will need to arrange for outpatient HD  Donetta Potts, MD [facility] [phone]

## 2019-07-10 NOTE — Significant Event (Signed)
Patient confused but redirectable. Question encephalopathy related to lingering anesthesia. STAT BMP and Troponin ordered. Patient with generalized pain but can not say where. Pain in not reproducable. No acute abnormalities noted on EKG/Telemetry. Will given oxycodone now. Plan for HD in AM.

## 2019-07-10 NOTE — Progress Notes (Signed)
ANTICOAGULATION CONSULT NOTE - Follow Up Consult  Pharmacy Consult for Heparin (Warfarin on hold) Indication: atrial fibrillation  Allergies  Allergen Reactions  . Ace Inhibitors Cough    Patient Measurements: Height: 5\' 7"  (170.2 cm) Weight: 170 lb 6.4 oz (77.3 kg) IBW/kg (Calculated) : 61.6 Heparin Dosing Weight: 77 kg  Vital Signs: Temp: 97.8 F (36.6 C) (11/30 1200) Temp Source: Axillary (11/30 1200) BP: 150/59 (11/30 1200) Pulse Rate: 57 (11/30 0955)  Labs: Recent Labs    07/08/19 0458 07/08/19 1747 07/09/19 0448 07/10/19 0537  HGB 7.5*  --  7.6* 7.5*  HCT 23.6*  --  23.7* 23.4*  PLT 254  --  267 283  LABPROT 15.8*  --  15.2 14.2  INR 1.3*  --  1.2 1.1  HEPARINUNFRC 0.48  --  0.54 0.28*  CREATININE 4.30* 4.58* 4.93* 3.69*   Assessment:  63 yo female on warfarin at home for hx Afib (CHADS2VASc = 7). Pharmacy originally consulted bridge IV heparin to Coumadin given sub-therapeutic INR on admit and was at goal on heparin 950 units/hr. All anticoagulation held 11/22 and warfarin reversed with vitamin K and FFP 11/25 for urgent HD line placement 11/25. Heparin was restarted after this with level at goal within 6 hr, but patient had significant oozing from line site so heparin was discontinued again on 11/26.   No longer oozing 11/27, so pharmacy was re-consulted for heparin. Targeting lower end of therapeutic range given recent bleeding and line placement.   Warfarin continues on hold for permanent HD access placement on 11/30.    Heparin level just subtherapeutic (0.28) this am on 950 units/hr, then held for OR.  Now s/p AV fistula placement and heparin to resume 8 hrs post-op. RN reports some oozing at site.  INR 1.1  Goal of Therapy:  INR 2-3 Heparin level 0.3-0.5 units/ml Monitor platelets by anticoagulation protocol: Yes   Plan:   Follow up line oozing.   Plan to resume heparin drip ~5:30pm at 1000 units/hr  Heparin level ~8 hrs after resuming.  Daily  heparin level, PT/INR and CBC.  Follow up for resuming warfarin and plavix.  Arty Baumgartner, [place] Pager: [phone] or phone: [phone] 07/10/2019,1:14 PM

## 2019-07-10 NOTE — Anesthesia Postprocedure Evaluation (Signed)
Anesthesia Post Note  Patient: Amber Young  Procedure(s) Performed: INSERTION OF TUNNELED DIALYSIS CATHETER (Right Neck) ARTERIOVENOUS (AV) FISTULA CREATION (Left Arm Upper)     Patient location during evaluation: PACU Anesthesia Type: General Level of consciousness: awake Pain management: pain level controlled Vital Signs Assessment: post-procedure vital signs reviewed and stable Respiratory status: spontaneous breathing, nonlabored ventilation, respiratory function stable and patient connected to nasal cannula oxygen Cardiovascular status: blood pressure returned to baseline and stable Postop Assessment: no apparent nausea or vomiting Anesthetic complications: no    Last Vitals:  Vitals:   07/10/19 1200 07/10/19 1500  BP: (!) 150/59 (!) 150/68  Pulse:    Resp: 16 18  Temp: 36.6 C 36.8 C  SpO2: 98% 100%    Last Pain:  Vitals:   07/10/19 1500  TempSrc: Oral  PainSc:                  Ryan P Ellender

## 2019-07-10 NOTE — Transfer of Care (Signed)
Immediate Anesthesia Transfer of Care Note  Patient: Amber Young  Procedure(s) Performed: INSERTION OF TUNNELED DIALYSIS CATHETER (Right Neck) ARTERIOVENOUS (AV) FISTULA CREATION (Left Arm Upper)  Patient Location: PACU  Anesthesia Type:General  Level of Consciousness: drowsy and patient cooperative  Airway & Oxygen Therapy: Patient Spontanous Breathing  Post-op Assessment: Report given to RN and Post -op Vital signs reviewed and stable  Post vital signs: Reviewed and stable  Last Vitals:  Vitals Value Taken Time  BP 164/66 07/10/19 0926  Temp    Pulse 57 07/10/19 0928  Resp 18 07/10/19 0928  SpO2 94 % 07/10/19 0928  Vitals shown include unvalidated device data.  Last Pain:  Vitals:   07/10/19 0452  TempSrc: Oral  PainSc:          Complications: No apparent anesthesia complications

## 2019-07-10 NOTE — Progress Notes (Signed)
Pt reported chest pain at 2150. Obtained an EKG which displayed abnormality compared to previous EKG. Patient is severely lethargic, only occasionally responds to commands. MD and Rapid Response both notified. Rapid currently in room, MD on her way.

## 2019-07-10 NOTE — Anesthesia Procedure Notes (Signed)
Procedure Name: LMA Insertion Date/Time: 07/10/2019 7:52 AM Performed by: Lance Coon, CRNA Pre-anesthesia Checklist: Patient identified, Emergency Drugs available, Suction available, Patient being monitored and Timeout performed Patient Re-evaluated:Patient Re-evaluated prior to induction Oxygen Delivery Method: Circle system utilized Preoxygenation: Pre-oxygenation with 100% oxygen Induction Type: IV induction LMA: LMA inserted LMA Size: 4.0 Number of attempts: 1 Placement Confirmation: positive ETCO2 and breath sounds checked- equal and bilateral Tube secured with: Tape Dental Injury: Teeth and Oropharynx as per pre-operative assessment

## 2019-07-10 NOTE — Progress Notes (Signed)
PROGRESS NOTE    Amber Young  [id number] DOB: 09/19/1955 DOA: 06/29/2019 PCP: Azzie Glatter, FNP   Brief Narrative: 63 year old with past medical history significant for diabetes, CVA 2016, hypertension, hyperlipidemia, coronary artery disease status post a stent in 2019, stage IV chronic kidney disease, chronic combined CHF and A. fib on Coumadin who was last admitted to [facility] service from 11/5-8 with DKA.  Patient was a direct admission by the cardiology service on 11/19 with worsening CHF, worsening renal function and volume overload.  Her renal function has not improved. She transition to end-stage renal disease and is started hemodialysis on 07/07/2019.  AV fistula with place on 07/10/2019.  TRH assumed care on 11/28. Patient also underwent tunneled hemodialysis catheter on 11/30.  Currently on heparin, Coumadin has been held for procedure today.  Follow Cardiovascular surgery recommendation when to resume coumadin.    Assessment & Plan:   Active Problems:   Acute on chronic combined systolic and diastolic CHF (congestive heart failure) (HCC)   CKD (chronic kidney disease), stage V (HCC)  1-Acute on chronic systolic and diastolic heart failure exacerbation: Left ventricular ejection fraction this admission 35 to 40% up from 25 to 30% She was started on hemodialysis.  Volume  being managed with  hemodialysis.  2-hypertension: Continue with amlodipine, carvedilol Imdur and hydralazine.  Cardiology recommend to replace CCB with ARB if okay with nephrology  3-paroxysmal A. fib: Coumadin on hold for procedure today.  Follow vascular recommendation when to resume Coumadin. Heparin to be resumed 8 hours after procedure.  CAD status post PCI; she is on Plavix and Coumadin, are held due to vascular procedure today.  AKI on chronic kidney disease has progressed to end-stage renal disease. New on hemodialysis. Patient underwent fistula and tunneled  hemodialysis catheter placement  on 11/30. Nephrology following. Patient was a started on hemodialysis on 07/07/2019  Anemia probably anemia of chronic disease Hb  stable.  Will check anemia panel  Estimated body mass index is 26.69 kg/m as calculated from the following:   Height as of this encounter: 5\' 7"  (1.702 m).   Weight as of this encounter: 77.3 kg.   DVT prophylaxis: Heparin Code Status: Full code Family Communication: Family at bedside Disposition Plan: We will get PT eval Consultants:   Nephrology  Vascular  Procedures:   HD catheter tunneled placement 11-30. Fistula.   Antimicrobials:    Subjective: Patient is alert, eating lunch.. She underwent procedure today, she is breathing okay. Objective: Vitals:   07/10/19 0926 07/10/19 0940 07/10/19 0955 07/10/19 1200  BP: (!) 164/66 (!) 157/60 (!) 148/60 (!) 150/59  Pulse: (!) 59 (!) 56 (!) 57   Resp: 15 18 16 16   Temp: 97.7 F (36.5 C)  97.8 F (36.6 C) 97.8 F (36.6 C)  TempSrc:    Axillary  SpO2: 94% 96% 94% 98%  Weight:      Height:        Intake/Output Summary (Last 24 hours) at 07/10/2019 1544 Last data filed at 07/10/2019 1314 Gross per 24 hour  Intake 1200 ml  Output 1925 ml  Net -725 ml   Filed Weights   07/09/19 2335 07/10/19 0245 07/10/19 0452  Weight: 79.8 kg 78.1 kg 77.3 kg    Examination:  General exam: Appears calm and comfortable  Respiratory system: Crackles at the bases Cardiovascular system: S1 & S2 heard, RRR. No JVD, murmurs, rubs, gallops or clicks. No pedal edema. Gastrointestinal system: Abdomen is nondistended, soft and nontender. No organomegaly or  masses felt. Normal bowel sounds heard. Central nervous system: Alert and oriented Extremities: Symmetric 5 x 5 power. Skin: No rashes, lesions or ulcers Psychiatry: Judgement and insight appear normal. Mood & affect appropriate.     Data Reviewed: I have personally reviewed following labs and imaging studies  CBC: Recent Labs  Lab 07/06/19 1156  07/07/19 0356 07/08/19 0458 07/09/19 0448 07/10/19 0537  WBC 6.5 5.4 6.2 5.7 5.1  NEUTROABS  --   --   --  3.8  --   HGB 7.8* 7.5* 7.5* 7.6* 7.5*  HCT 24.2* 23.7* 23.6* 23.7* 23.4*  MCV 76.1* 75.5* 75.2* 75.7* 74.8*  PLT 242 248 254 267 [id number]   Basic Metabolic Panel: Recent Labs  Lab 07/06/19 0335 07/07/19 0356 07/08/19 0458 07/08/19 1747 07/09/19 0448 07/10/19 0537  NA 134* 135 134* 135 133* 136  K 3.5 3.3* 3.7 3.9 3.9 3.7  CL 95* 95* 97* 97* 96* 98  CO2 26 26 25 24 24 25   GLUCOSE 116* 93 125* 143* 126* 109*  BUN 57* 66* 42* 44* 51* 30*  CREATININE 4.45* 4.66* 4.30* 4.58* 4.93* 3.69*  CALCIUM 8.4* 8.9 8.7* 8.8* 8.8* 9.1  PHOS 3.6 4.8* 4.0  --  5.1* 3.4   GFR: Estimated Creatinine Clearance: 16.7 mL/min (A) (by C-G formula based on SCr of 3.69 mg/dL (H)). Liver Function Tests: Recent Labs  Lab 07/06/19 0335 07/07/19 0356 07/08/19 0458 07/09/19 0448 07/10/19 0537  ALBUMIN 2.8* 2.9* 2.8* 2.8* 2.9*   No results for input(s): LIPASE, AMYLASE in the last 168 hours. No results for input(s): AMMONIA in the last 168 hours. Coagulation Profile: Recent Labs  Lab 07/06/19 0335 07/07/19 0356 07/08/19 0458 07/09/19 0448 07/10/19 0537  INR 1.5* 1.3* 1.3* 1.2 1.1   Cardiac Enzymes: No results for input(s): CKTOTAL, CKMB, CKMBINDEX, TROPONINI in the last 168 hours. BNP (last 3 results) No results for input(s): PROBNP in the last 8760 hours. HbA1C: No results for input(s): HGBA1C in the last 72 hours. CBG: Recent Labs  Lab 07/09/19 1720 07/09/19 2007 07/10/19 0440 07/10/19 0933 07/10/19 1105  GLUCAP 159* 180* 100* 111* 153*   Lipid Profile: No results for input(s): CHOL, HDL, LDLCALC, TRIG, CHOLHDL, LDLDIRECT in the last 72 hours. Thyroid Function Tests: No results for input(s): TSH, T4TOTAL, FREET4, T3FREE, THYROIDAB in the last 72 hours. Anemia Panel: No results for input(s): VITAMINB12, FOLATE, FERRITIN, TIBC, IRON, RETICCTPCT in the last 72 hours. Sepsis  Labs: No results for input(s): PROCALCITON, LATICACIDVEN in the last 168 hours.  Recent Results (from the past 240 hour(s))  Surgical pcr screen     Status: None   Collection Time: 07/10/19  4:33 AM   Specimen: Nasal Mucosa; Nasal Swab  Result Value Ref Range Status   MRSA, PCR NEGATIVE NEGATIVE Final   Staphylococcus aureus NEGATIVE NEGATIVE Final    Comment: (NOTE) The Xpert SA Assay (FDA approved for NASAL specimens in patients 85 years of age and older), is one component of a comprehensive surveillance program. It is not intended to diagnose infection nor to guide or monitor treatment. Performed at [facility], [place] [address], [place], [place] [address]          Radiology Studies: Dg Chest Port 1 View  Result Date: 07/10/2019 CLINICAL DATA:  Status post placement of dialysis catheter. EXAM: PORTABLE CHEST 1 VIEW COMPARISON:  Two-view chest x-ray 08/28/2018. Fluoroscopic procedure images 07/05/19. FINDINGS: The heart is scratched at the heart size is exaggerated by low lung volumes. Increased  diffuse interstitial pattern is present. Asymmetric opacities are present at the right base. There is no pneumothorax. Right IJ dialysis catheter tip terminates at the right atrium. IMPRESSION: 1. Right IJ dialysis catheter tip terminates at the right atrium. 2. No pneumothorax. 3. Increased interstitial pattern compatible with edema. 4. Asymmetric airspace disease at the right base. While this likely represents atelectasis, infection is not excluded. Electronically Signed   By: San Morelle M.D.   On: 07/10/2019 09:51   Dg Fluoro Guide Cv Line-no Report  Result Date: 07/10/2019 Fluoroscopy was utilized by the requesting physician.  No radiographic interpretation.        Scheduled Meds: . amLODipine  10 mg Oral Daily  . atorvastatin  40 mg Oral q1800  . brimonidine  1 drop Left Eye BID  . carvedilol  25 mg Oral BID  . Chlorhexidine Gluconate Cloth  6 each Topical  Daily  . Chlorhexidine Gluconate Cloth  6 each Topical Q0600  . Chlorhexidine Gluconate Cloth  6 each Topical Q0600  . [START ON 07/11/2019] darbepoetin (ARANESP) injection - DIALYSIS  100 mcg Intravenous Q Tue-HD  . dorzolamide-timolol  1 drop Left Eye BID  . ferrous sulfate  325 mg Oral QODAY  . furosemide  80 mg Oral Daily  . hydrALAZINE  100 mg Oral TID  . insulin aspart  0-9 Units Subcutaneous Q4H  . isosorbide mononitrate  30 mg Oral Daily  . ketorolac  1 drop Right Eye QID  . ofloxacin  1 drop Right Eye TID   Continuous Infusions: . sodium chloride Stopped (07/10/19 0923)  . cefUROXime (ZINACEF)  IV    . heparin       LOS: 11 days    Time spent: 35 minutes.     Elmarie Shiley, MD [facility]   If 7PM-7AM, please contact night-coverage [email or web address] Password [facility] 07/10/2019, 3:44 PM

## 2019-07-11 ENCOUNTER — Encounter (HOSPITAL_COMMUNITY): Payer: Self-pay | Admitting: Vascular Surgery

## 2019-07-11 DIAGNOSIS — D631 Anemia in chronic kidney disease: Secondary | ICD-10-CM

## 2019-07-11 DIAGNOSIS — Z992 Dependence on renal dialysis: Secondary | ICD-10-CM

## 2019-07-11 DIAGNOSIS — N186 End stage renal disease: Secondary | ICD-10-CM

## 2019-07-11 HISTORY — DX: Dependence on renal dialysis: Z99.2

## 2019-07-11 LAB — RENAL FUNCTION PANEL
Albumin: 3.1 g/dL — ABNORMAL LOW (ref 3.5–5.0)
Anion gap: 17 — ABNORMAL HIGH (ref 5–15)
BUN: 43 mg/dL — ABNORMAL HIGH (ref 8–23)
CO2: 21 mmol/L — ABNORMAL LOW (ref 22–32)
Calcium: 9.3 mg/dL (ref 8.9–10.3)
Chloride: 94 mmol/L — ABNORMAL LOW (ref 98–111)
Creatinine, Ser: 4.87 mg/dL — ABNORMAL HIGH (ref 0.44–1.00)
GFR calc Af Amer: 10 mL/min — ABNORMAL LOW (ref >60)
GFR calc non Af Amer: 9 mL/min — ABNORMAL LOW (ref >60)
Glucose, Bld: 246 mg/dL — ABNORMAL HIGH (ref 70–99)
Phosphorus: 5.5 mg/dL — ABNORMAL HIGH (ref 2.5–4.6)
Potassium: 4.9 mmol/L (ref 3.5–5.1)
Sodium: 132 mmol/L — ABNORMAL LOW (ref 135–145)

## 2019-07-11 LAB — BASIC METABOLIC PANEL
Anion gap: 19 — ABNORMAL HIGH (ref 5–15)
Anion gap: 15 (ref 5–15)
Anion gap: 13 (ref 5–15)
BUN: 42 mg/dL — ABNORMAL HIGH (ref 8–23)
BUN: 49 mg/dL — ABNORMAL HIGH (ref 8–23)
BUN: 18 mg/dL (ref 8–23)
CO2: 18 mmol/L — ABNORMAL LOW (ref 22–32)
CO2: 22 mmol/L (ref 22–32)
CO2: 24 mmol/L (ref 22–32)
Calcium: 9.4 mg/dL (ref 8.9–10.3)
Calcium: 9.4 mg/dL (ref 8.9–10.3)
Calcium: 8.4 mg/dL — ABNORMAL LOW (ref 8.9–10.3)
Chloride: 94 mmol/L — ABNORMAL LOW (ref 98–111)
Chloride: 96 mmol/L — ABNORMAL LOW (ref 98–111)
Chloride: 97 mmol/L — ABNORMAL LOW (ref 98–111)
Creatinine, Ser: 4.74 mg/dL — ABNORMAL HIGH (ref 0.44–1.00)
Creatinine, Ser: 5.21 mg/dL — ABNORMAL HIGH (ref 0.44–1.00)
Creatinine, Ser: 2.41 mg/dL — ABNORMAL HIGH (ref 0.44–1.00)
GFR calc Af Amer: 11 mL/min — ABNORMAL LOW (ref >60)
GFR calc Af Amer: 9 mL/min — ABNORMAL LOW (ref >60)
GFR calc Af Amer: 24 mL/min — ABNORMAL LOW (ref >60)
GFR calc non Af Amer: 9 mL/min — ABNORMAL LOW (ref >60)
GFR calc non Af Amer: 8 mL/min — ABNORMAL LOW (ref >60)
GFR calc non Af Amer: 21 mL/min — ABNORMAL LOW (ref >60)
Glucose, Bld: 243 mg/dL — ABNORMAL HIGH (ref 70–99)
Glucose, Bld: 261 mg/dL — ABNORMAL HIGH (ref 70–99)
Glucose, Bld: 119 mg/dL — ABNORMAL HIGH (ref 70–99)
Potassium: 4.8 mmol/L (ref 3.5–5.1)
Potassium: 4.9 mmol/L (ref 3.5–5.1)
Potassium: 3.8 mmol/L (ref 3.5–5.1)
Sodium: 131 mmol/L — ABNORMAL LOW (ref 135–145)
Sodium: 133 mmol/L — ABNORMAL LOW (ref 135–145)
Sodium: 134 mmol/L — ABNORMAL LOW (ref 135–145)

## 2019-07-11 LAB — RETICULOCYTES
Immature Retic Fract: 16 % — ABNORMAL HIGH (ref 2.3–15.9)
RBC.: 3.22 MIL/uL — ABNORMAL LOW (ref 3.87–5.11)
Retic Count, Absolute: 80.8 10*3/uL (ref 19.0–186.0)
Retic Ct Pct: 2.5 % (ref 0.4–3.1)

## 2019-07-11 LAB — HEPARIN LEVEL (UNFRACTIONATED)
Heparin Unfractionated: 0.34 IU/mL (ref 0.30–0.70)
Heparin Unfractionated: 0.47 IU/mL (ref 0.30–0.70)

## 2019-07-11 LAB — PROTIME-INR
INR: 1.1 (ref 0.8–1.2)
Prothrombin Time: 14.1 seconds (ref 11.4–15.2)

## 2019-07-11 LAB — GLUCOSE, CAPILLARY
Glucose-Capillary: 112 mg/dL — ABNORMAL HIGH (ref 70–99)
Glucose-Capillary: 242 mg/dL — ABNORMAL HIGH (ref 70–99)
Glucose-Capillary: 242 mg/dL — ABNORMAL HIGH (ref 70–99)
Glucose-Capillary: 245 mg/dL — ABNORMAL HIGH (ref 70–99)
Glucose-Capillary: 249 mg/dL — ABNORMAL HIGH (ref 70–99)
Glucose-Capillary: 263 mg/dL — ABNORMAL HIGH (ref 70–99)

## 2019-07-11 LAB — IRON AND TIBC
Iron: 21 ug/dL — ABNORMAL LOW (ref 28–170)
Saturation Ratios: 5 % — ABNORMAL LOW (ref 10.4–31.8)
TIBC: 382 ug/dL (ref 250–450)
UIBC: 361 ug/dL

## 2019-07-11 LAB — TROPONIN I (HIGH SENSITIVITY)
Troponin I (High Sensitivity): 41 ng/L — ABNORMAL HIGH (ref <18)
Troponin I (High Sensitivity): 46 ng/L — ABNORMAL HIGH (ref <18)

## 2019-07-11 LAB — VITAMIN B12: Vitamin B-12: 1036 pg/mL — ABNORMAL HIGH (ref 180–914)

## 2019-07-11 LAB — BETA-HYDROXYBUTYRIC ACID: Beta-Hydroxybutyric Acid: 0.9 mmol/L — ABNORMAL HIGH (ref 0.05–0.27)

## 2019-07-11 LAB — FERRITIN: Ferritin: 46 ng/mL (ref 11–307)

## 2019-07-11 LAB — MAGNESIUM: Magnesium: 2.1 mg/dL (ref 1.7–2.4)

## 2019-07-11 LAB — FOLATE: Folate: 9.4 ng/mL (ref >5.9)

## 2019-07-11 MED ORDER — INSULIN ASPART 100 UNIT/ML ~~LOC~~ SOLN
0.0000 [IU] | Freq: Every day | SUBCUTANEOUS | Status: DC
  Administered 2019-07-12: 2 [IU] via SUBCUTANEOUS

## 2019-07-11 MED ORDER — DEXTROSE 50 % IV SOLN: 0.0000 mL | INTRAVENOUS | Status: DC | PRN

## 2019-07-11 MED ORDER — HEPARIN SODIUM (PORCINE) 1000 UNIT/ML IJ SOLN
INTRAMUSCULAR | Status: AC
  Filled 2019-07-11: qty 4

## 2019-07-11 MED ORDER — INSULIN ASPART 100 UNIT/ML ~~LOC~~ SOLN
0.0000 [IU] | Freq: Three times a day (TID) | SUBCUTANEOUS | Status: DC
  Administered 2019-07-11: 11:00:00 5 [IU] via SUBCUTANEOUS
  Administered 2019-07-12: 2 [IU] via SUBCUTANEOUS
  Administered 2019-07-12: 5 [IU] via SUBCUTANEOUS
  Administered 2019-07-12 – 2019-07-13 (×2): 3 [IU] via SUBCUTANEOUS

## 2019-07-11 MED ORDER — INSULIN GLARGINE 100 UNIT/ML ~~LOC~~ SOLN
4.0000 [IU] | Freq: Every day | SUBCUTANEOUS | Status: DC
  Administered 2019-07-11 – 2019-07-12 (×2): 4 [IU] via SUBCUTANEOUS
  Filled 2019-07-11 (×4): qty 0.04

## 2019-07-11 MED ORDER — INSULIN ASPART 100 UNIT/ML ~~LOC~~ SOLN: 0.0000 [IU] | Freq: Three times a day (TID) | SUBCUTANEOUS | Status: DC

## 2019-07-11 MED ORDER — INSULIN REGULAR(HUMAN) IN NACL 100-0.9 UT/100ML-% IV SOLN
INTRAVENOUS | Status: DC
  Filled 2019-07-11: qty 100

## 2019-07-11 MED ORDER — DARBEPOETIN ALFA 100 MCG/0.5ML IJ SOSY
PREFILLED_SYRINGE | INTRAMUSCULAR | Status: AC
  Administered 2019-07-11: 100 ug via INTRAVENOUS
  Filled 2019-07-11: qty 0.5

## 2019-07-11 MED ORDER — ALPRAZOLAM 0.5 MG PO TABS
ORAL_TABLET | ORAL | Status: AC
  Administered 2019-07-11: 0.25 mg via ORAL
  Filled 2019-07-11: qty 1

## 2019-07-11 MED ORDER — INSULIN GLARGINE 100 UNIT/ML ~~LOC~~ SOLN: 7.0000 [IU] | Freq: Every day | SUBCUTANEOUS | Status: DC

## 2019-07-11 MED ORDER — SODIUM CHLORIDE 0.9 % IV SOLN: 100.0000 mL | INTRAVENOUS | Status: DC | PRN

## 2019-07-11 MED ORDER — HEPARIN SODIUM (PORCINE) 1000 UNIT/ML IJ SOLN
INTRAMUSCULAR | Status: AC
  Administered 2019-07-11: 3400 [IU] via INTRAVENOUS_CENTRAL
  Filled 2019-07-11: qty 4

## 2019-07-11 MED ORDER — PENTAFLUOROPROP-TETRAFLUOROETH EX AERO: 1.0000 "application " | INHALATION_SPRAY | CUTANEOUS | Status: DC | PRN

## 2019-07-11 MED ORDER — HEPARIN SODIUM (PORCINE) 1000 UNIT/ML DIALYSIS
1000.0000 [IU] | INTRAMUSCULAR | Status: DC | PRN
  Administered 2019-07-11: 20:00:00 3400 [IU] via INTRAVENOUS_CENTRAL
  Filled 2019-07-11: qty 1

## 2019-07-11 MED ORDER — LIDOCAINE HCL (PF) 1 % IJ SOLN: 5.0000 mL | INTRAMUSCULAR | Status: DC | PRN

## 2019-07-11 MED ORDER — ALTEPLASE 2 MG IJ SOLR: 2.0000 mg | Freq: Once | INTRAMUSCULAR | Status: DC | PRN

## 2019-07-11 MED ORDER — LIDOCAINE-PRILOCAINE 2.5-2.5 % EX CREA: 1.0000 "application " | TOPICAL_CREAM | CUTANEOUS | Status: DC | PRN

## 2019-07-11 NOTE — Progress Notes (Signed)
Patient has been refusing meds and meals during shift.  Spouse at bedside since morning.  Patient presenting with acute confusion.  Patient left the floor at this time for dialysis. Samik Balkcom Loel Ro

## 2019-07-11 NOTE — Progress Notes (Signed)
   VASCULAR SURGERY ASSESSMENT & PLAN:   POD 1 -LEFT ULNAR CEPHALIC FISTULA: Her fistula has an excellent thrill.  She has a palpable radial pulse.  I have arranged follow-up in 6 weeks to check on the maturation of her fistula.  Vascular surgery will be available as needed.  SUBJECTIVE:   No change in mental status.  PHYSICAL EXAM:   Vitals:   07/10/19 2137 07/10/19 2209 07/11/19 0340 07/11/19 0905  BP: (!) 164/66 (!) 184/69 (!) 176/57 (!) 172/62  Pulse: 74  84 79  Resp:   20 20  Temp:   98.1 F (36.7 C) 98.3 F (36.8 C)  TempSrc:   Oral Axillary  SpO2:   96% 100%  Weight:   77.1 kg   Height:       Her incision looks fine. She has an excellent thrill in her left upper arm fistula. She has a palpable left radial pulse.  LABS:   Lab Results  Component Value Date   WBC 5.1 07/10/2019   HGB 7.5 (L) 07/10/2019   HCT 23.4 (L) 07/10/2019   MCV 74.8 (L) 07/10/2019   PLT 283 07/10/2019   Lab Results  Component Value Date   CREATININE 4.87 (H) 07/11/2019   Lab Results  Component Value Date   INR 1.1 07/11/2019   CBG (last 3)  Recent Labs    07/11/19 0020 07/11/19 0414 07/11/19 0850  GLUCAP 245* 249* 263*    PROBLEM LIST:    Active Problems:   Acute on chronic combined systolic and diastolic CHF (congestive heart failure) (HCC)   CKD (chronic kidney disease), stage V (HCC)   CURRENT MEDS:   . amLODipine  10 mg Oral Daily  . atorvastatin  40 mg Oral q1800  . brimonidine  1 drop Left Eye BID  . carvedilol  25 mg Oral BID  . Chlorhexidine Gluconate Cloth  6 each Topical Daily  . Chlorhexidine Gluconate Cloth  6 each Topical Q0600  . Chlorhexidine Gluconate Cloth  6 each Topical Q0600  . darbepoetin (ARANESP) injection - DIALYSIS  100 mcg Intravenous Q Tue-HD  . dorzolamide-timolol  1 drop Left Eye BID  . ferrous sulfate  325 mg Oral QODAY  . furosemide  80 mg Oral Daily  . hydrALAZINE  100 mg Oral TID  . isosorbide mononitrate  30 mg Oral Daily  .  ketorolac  1 drop Right Eye QID  . ofloxacin  1 drop Right Eye TID    [name] Office: [phone] 07/11/2019

## 2019-07-11 NOTE — Progress Notes (Signed)
ANTICOAGULATION CONSULT NOTE - Follow Up Consult  Pharmacy Consult for heparin Indication: atrial fibrillation  Labs: Recent Labs    07/08/19 0458  07/09/19 0448 07/10/19 0537 07/10/19 2310 07/11/19 0216  HGB 7.5*  --  7.6* 7.5*  --   --   HCT 23.6*  --  23.7* 23.4*  --   --   PLT 254  --  267 283  --   --   LABPROT 15.8*  --  15.2 14.2  --   --   INR 1.3*  --  1.2 1.1  --   --   HEPARINUNFRC 0.48  --  0.54 0.28*  --  0.34  CREATININE 4.30*   < > 4.93* 3.69* 4.74*  --   TROPONINIHS  --   --   --   --  41*  --    < > = values in this interval not displayed.    Assessment/Plan:  63yo female therapeutic on heparin after resuming. Will continue gtt at current rate and confirm stable with additional level.   Wynona Neat, PharmD, BCPS  07/11/2019,3:38 AM

## 2019-07-11 NOTE — Progress Notes (Signed)
OT Cancellation Note  Patient Details Name: Amber Young MRN: [id number] DOB: March 26, 1956   Cancelled Treatment:     Patient calling out "help me, please help me," confused this AM. Discussed with patient's RN and will re-attempt as appropriate.   [facility] OT office: [place] 07/11/2019, 10:09 AM

## 2019-07-11 NOTE — Progress Notes (Signed)
I was notified by Georga Hacking for a concern of neuro change in Amber Young. Upon arrival, she was alert, oriented to person and day of the week.  She would intermittently follow simple commands. She was complaining of pain but could never state where or describe her pain. Positive bruit and thrill to new L AV fistula. Some swelling proximal to incision and sore. CBG 233. She had no focal neuro deficits. Primary service notified and Hayden Pedro NP came to bedside. Oxycodone will be used for pain.

## 2019-07-11 NOTE — Progress Notes (Signed)
PROGRESS NOTE    Amber Young  [id number] DOB: 1956-01-29 DOA: 06/29/2019 PCP: Azzie Glatter, FNP   Brief Narrative: 63 year old with past medical history significant for diabetes, CVA 2016, hypertension, hyperlipidemia, coronary artery disease status post a stent in 2019, stage IV chronic kidney disease, chronic combined CHF and A. fib on Coumadin who was last admitted to [facility] service from 11/5-8 with DKA.  Patient was a direct admission by the cardiology service on 11/19 with worsening CHF, worsening renal function and volume overload.  Her renal function has not improved. She transition to end-stage renal disease and is started hemodialysis on 07/07/2019.  AV fistula with place on 07/10/2019.  TRH assumed care on 11/28. Patient also underwent tunneled hemodialysis catheter on 11/30.  Currently on heparin, Coumadin has been held for procedure today.     Assessment & Plan:   Active Problems:   Acute on chronic combined systolic and diastolic CHF (congestive heart failure) (HCC)   CKD (chronic kidney disease), stage V (HCC)    AMS after procedure due to metabolic encephalopathy (similar to prior hospitalization when found to be in DKA) -gap 19 overnight but now down to 15-- will hold on insulin gtt for now but may need if gap widens -resume lantus (4 units) -resume SSI -follow mentation  Acute on chronic systolic and diastolic heart failure exacerbation: Left ventricular ejection fraction this admission 35 to 40% up from 25 to 30% -She was started on hemodialysis for volume management  hypertension:  -Continue with amlodipine, carvedilol Imdur and hydralazine.   -Cardiology recommend to replace CCB with ARB if okay with nephrology  paroxysmal A. Fib: - Coumadin on hold- had procedure yesterday-- will resume coumadin tomorrow -Heparin to be resumed 8 hours after procedure.  AKI on chronic kidney disease has progressed to ESRD New on hemodialysis. Patient underwent  fistula and tunneled  hemodialysis catheter placement on 11/30. -started on HD 07/05/19 and had another session 11/27 and 11/29 -plan another session on 12/1 -nephrology consult appreciated  Anemia of CD -no sign of bleeding -defer to renal at HD for replacement   DVT prophylaxis: Heparin Code Status: Full code Family Communication: husband at bedside (spoke through nurse)  Disposition Plan: PT Eval  Consultants:   Nephrology  Vascular  Procedures:   HD catheter tunneled placement/fistula 11-30   Subjective: Per nursing has been altered since she came back from procedures-- prior was interactive and talkative- now combative at times   Objective: Vitals:   07/10/19 2137 07/10/19 2209 07/11/19 0340 07/11/19 0905  BP: (!) 164/66 (!) 184/69 (!) 176/57 (!) 172/62  Pulse: 74  84 79  Resp:   20 20  Temp:   98.1 F (36.7 C) 98.3 F (36.8 C)  TempSrc:   Oral Axillary  SpO2:   96% 100%  Weight:   77.1 kg   Height:        Intake/Output Summary (Last 24 hours) at 07/11/2019 1149 Last data filed at 07/11/2019 [id number] Gross per 24 hour  Intake 537.64 ml  Output 200 ml  Net 337.64 ml   Filed Weights   07/10/19 0245 07/10/19 0452 07/11/19 0340  Weight: 78.1 kg 77.3 kg 77.1 kg    Examination:  General exam: in bed, mumbles a few words Respiratory system: no increased work of breathing Cardiovascular system: rrr Gastrointestinal system: +BS, soft Central nervous system: Moves all 4 ext      Data Reviewed: I have personally reviewed following labs and imaging studies  CBC: Recent Labs  Lab 07/06/19 1156 07/07/19 0356 07/08/19 0458 07/09/19 0448 07/10/19 0537  WBC 6.5 5.4 6.2 5.7 5.1  NEUTROABS  --   --   --  3.8  --   HGB 7.8* 7.5* 7.5* 7.6* 7.5*  HCT 24.2* 23.7* 23.6* 23.7* 23.4*  MCV 76.1* 75.5* 75.2* 75.7* 74.8*  PLT 242 248 254 267 [id number]   Basic Metabolic Panel: Recent Labs  Lab 07/07/19 0356 07/08/19 0458  07/09/19 0448 07/10/19 0537 07/10/19  2310 07/11/19 0216 07/11/19 1006  NA 135 134*   < > 133* 136 131* 132* 133*  K 3.3* 3.7   < > 3.9 3.7 4.8 4.9 4.9  CL 95* 97*   < > 96* 98 94* 94* 96*  CO2 26 25   < > 24 25 18* 21* 22  GLUCOSE 93 125*   < > 126* 109* 243* 246* 261*  BUN 66* 42*   < > 51* 30* 42* 43* 49*  CREATININE 4.66* 4.30*   < > 4.93* 3.69* 4.74* 4.87* 5.21*  CALCIUM 8.9 8.7*   < > 8.8* 9.1 9.4 9.3 9.4  MG  --   --   --   --   --  2.1  --   --   PHOS 4.8* 4.0  --  5.1* 3.4  --  5.5*  --    < > = values in this interval not displayed.   GFR: Estimated Creatinine Clearance: 11.8 mL/min (A) (by C-G formula based on SCr of 5.21 mg/dL (H)). Liver Function Tests: Recent Labs  Lab 07/07/19 0356 07/08/19 0458 07/09/19 0448 07/10/19 0537 07/11/19 0216  ALBUMIN 2.9* 2.8* 2.8* 2.9* 3.1*   No results for input(s): LIPASE, AMYLASE in the last 168 hours. No results for input(s): AMMONIA in the last 168 hours. Coagulation Profile: Recent Labs  Lab 07/07/19 0356 07/08/19 0458 07/09/19 0448 07/10/19 0537 07/11/19 0216  INR 1.3* 1.3* 1.2 1.1 1.1   Cardiac Enzymes: No results for input(s): CKTOTAL, CKMB, CKMBINDEX, TROPONINI in the last 168 hours. BNP (last 3 results) No results for input(s): PROBNP in the last 8760 hours. HbA1C: No results for input(s): HGBA1C in the last 72 hours. CBG: Recent Labs  Lab 07/11/19 0020 07/11/19 0414 07/11/19 0850 07/11/19 0948 07/11/19 1105  GLUCAP 245* 249* 263* 242* 242*   Lipid Profile: No results for input(s): CHOL, HDL, LDLCALC, TRIG, CHOLHDL, LDLDIRECT in the last 72 hours. Thyroid Function Tests: No results for input(s): TSH, T4TOTAL, FREET4, T3FREE, THYROIDAB in the last 72 hours. Anemia Panel: Recent Labs    07/11/19 0216  VITAMINB12 1,036*  FOLATE 9.4  FERRITIN 46  TIBC 382  IRON 21*  RETICCTPCT 2.5   Sepsis Labs: No results for input(s): PROCALCITON, LATICACIDVEN in the last 168 hours.  Recent Results (from the past 240 hour(s))  Surgical pcr  screen     Status: None   Collection Time: 07/10/19  4:33 AM   Specimen: Nasal Mucosa; Nasal Swab  Result Value Ref Range Status   MRSA, PCR NEGATIVE NEGATIVE Final   Staphylococcus aureus NEGATIVE NEGATIVE Final    Comment: (NOTE) The Xpert SA Assay (FDA approved for NASAL specimens in patients 25 years of age and older), is one component of a comprehensive surveillance program. It is not intended to diagnose infection nor to guide or monitor treatment. Performed at [facility], [place] [address] [place], [place] [address]          Radiology Studies: Dg Chest [facility] 1 View  Result  Date: 07/10/2019 CLINICAL DATA:  Status post placement of dialysis catheter. EXAM: PORTABLE CHEST 1 VIEW COMPARISON:  Two-view chest x-ray 08/28/2018. Fluoroscopic procedure images 07/05/19. FINDINGS: The heart is scratched at the heart size is exaggerated by low lung volumes. Increased diffuse interstitial pattern is present. Asymmetric opacities are present at the right base. There is no pneumothorax. Right IJ dialysis catheter tip terminates at the right atrium. IMPRESSION: 1. Right IJ dialysis catheter tip terminates at the right atrium. 2. No pneumothorax. 3. Increased interstitial pattern compatible with edema. 4. Asymmetric airspace disease at the right base. While this likely represents atelectasis, infection is not excluded. Electronically Signed   By: San Morelle M.D.   On: 07/10/2019 09:51   Dg Fluoro Guide Cv Line-no Report  Result Date: 07/10/2019 Fluoroscopy was utilized by the requesting physician.  No radiographic interpretation.        Scheduled Meds: . amLODipine  10 mg Oral Daily  . atorvastatin  40 mg Oral q1800  . brimonidine  1 drop Left Eye BID  . carvedilol  25 mg Oral BID  . Chlorhexidine Gluconate Cloth  6 each Topical Daily  . Chlorhexidine Gluconate Cloth  6 each Topical Q0600  . Chlorhexidine Gluconate Cloth  6 each Topical Q0600  . darbepoetin (ARANESP)  injection - DIALYSIS  100 mcg Intravenous Q Tue-HD  . dorzolamide-timolol  1 drop Left Eye BID  . ferrous sulfate  325 mg Oral QODAY  . hydrALAZINE  100 mg Oral TID  . insulin aspart  0-15 Units Subcutaneous TID WC  . insulin aspart  0-5 Units Subcutaneous QHS  . insulin glargine  4 Units Subcutaneous Daily  . isosorbide mononitrate  30 mg Oral Daily  . ketorolac  1 drop Right Eye QID  . ofloxacin  1 drop Right Eye TID   Continuous Infusions: . sodium chloride Stopped (07/10/19 0923)  . heparin 1,000 Units/hr (07/10/19 1800)     LOS: 12 days    Time spent: 35 minutes.     Geradine Girt, DO [facility]   If 7PM-7AM, please contact night-coverage [email or web address] Password [facility] 07/11/2019, 11:49 AM

## 2019-07-11 NOTE — Progress Notes (Signed)
Both MD and rapid were paged regarding this change and believe her change in LOC to be related to anaesthesia administered earlier in the day.

## 2019-07-11 NOTE — Progress Notes (Signed)
Patient ID: Amber Young, female   DOB: 10-05-1955, 63 y.o.   MRN: [id number] S: was very confused this am. O:BP (!) 175/73 (BP Location: Left Arm)   Pulse 66   Temp 97.7 F (36.5 C) (Oral)   Resp 18   Ht 5\' 7"  (1.702 m)   Wt 77.1 kg   SpO2 100%   BMI 26.61 kg/m   Intake/Output Summary (Last 24 hours) at 07/11/2019 1305 Last data filed at 07/11/2019 0914 Gross per 24 hour  Intake 537.64 ml  Output 200 ml  Net 337.64 ml   Intake/Output: I/O last 3 completed shifts: In: 1377.6 [P.O.:690; I.V.:587.6; IV Piggyback:100] Out: 2125 [Urine:400; Other:1700; Blood:25]  Intake/Output this shift:  No intake/output data recorded. Weight change: -2.734 kg Gen: lethargic but confused CVS: no rub Resp: cta Abd: +BS, soft, NT/ND Ext: LUE AVF +T/B  Recent Labs  Lab 07/05/19 0821 07/06/19 0335 07/07/19 0356 07/08/19 0458 07/08/19 1747 07/09/19 0448 07/10/19 0537 07/10/19 2310 07/11/19 0216 07/11/19 1006  NA 132* 134* 135 134* 135 133* 136 131* 132* 133*  K 3.6 3.5 3.3* 3.7 3.9 3.9 3.7 4.8 4.9 4.9  CL 93* 95* 95* 97* 97* 96* 98 94* 94* 96*  CO2 25 26 26 25 24 24 25  18* 21* 22  GLUCOSE 109* 116* 93 125* 143* 126* 109* 243* 246* 261*  BUN 91* 57* 66* 42* 44* 51* 30* 42* 43* 49*  CREATININE 6.06* 4.45* 4.66* 4.30* 4.58* 4.93* 3.69* 4.74* 4.87* 5.21*  ALBUMIN 2.9* 2.8* 2.9* 2.8*  --  2.8* 2.9*  --  3.1*  --   CALCIUM 8.9 8.4* 8.9 8.7* 8.8* 8.8* 9.1 9.4 9.3 9.4  PHOS 4.5 3.6 4.8* 4.0  --  5.1* 3.4  --  5.5*  --    Liver Function Tests: Recent Labs  Lab 07/09/19 0448 07/10/19 0537 07/11/19 0216  ALBUMIN 2.8* 2.9* 3.1*   No results for input(s): LIPASE, AMYLASE in the last 168 hours. No results for input(s): AMMONIA in the last 168 hours. CBC: Recent Labs  Lab 07/06/19 1156 07/07/19 0356 07/08/19 0458 07/09/19 0448 07/10/19 0537  WBC 6.5 5.4 6.2 5.7 5.1  NEUTROABS  --   --   --  3.8  --   HGB 7.8* 7.5* 7.5* 7.6* 7.5*  HCT 24.2* 23.7* 23.6* 23.7* 23.4*  MCV 76.1*  75.5* 75.2* 75.7* 74.8*  PLT 242 248 254 267 283   Cardiac Enzymes: No results for input(s): CKTOTAL, CKMB, CKMBINDEX, TROPONINI in the last 168 hours. CBG: Recent Labs  Lab 07/11/19 0020 07/11/19 0414 07/11/19 0850 07/11/19 0948 07/11/19 1105  GLUCAP 245* 249* 263* 242* 242*    Iron Studies:  Recent Labs    07/11/19 0216  IRON 21*  TIBC 382  FERRITIN 46   Studies/Results: Dg Chest Port 1 View  Result Date: 07/10/2019 CLINICAL DATA:  Status post placement of dialysis catheter. EXAM: PORTABLE CHEST 1 VIEW COMPARISON:  Two-view chest x-ray 08/28/2018. Fluoroscopic procedure images 07/05/19. FINDINGS: The heart is scratched at the heart size is exaggerated by low lung volumes. Increased diffuse interstitial pattern is present. Asymmetric opacities are present at the right base. There is no pneumothorax. Right IJ dialysis catheter tip terminates at the right atrium. IMPRESSION: 1. Right IJ dialysis catheter tip terminates at the right atrium. 2. No pneumothorax. 3. Increased interstitial pattern compatible with edema. 4. Asymmetric airspace disease at the right base. While this likely represents atelectasis, infection is not excluded. Electronically Signed   By: Wynetta Fines.D.  On: 07/10/2019 09:51   Dg Fluoro Guide Cv Line-no Report  Result Date: 07/10/2019 Fluoroscopy was utilized by the requesting physician.  No radiographic interpretation.   Marland Kitchen amLODipine  10 mg Oral Daily  . atorvastatin  40 mg Oral q1800  . brimonidine  1 drop Left Eye BID  . carvedilol  25 mg Oral BID  . Chlorhexidine Gluconate Cloth  6 each Topical Daily  . Chlorhexidine Gluconate Cloth  6 each Topical Q0600  . Chlorhexidine Gluconate Cloth  6 each Topical Q0600  . darbepoetin (ARANESP) injection - DIALYSIS  100 mcg Intravenous Q Tue-HD  . dorzolamide-timolol  1 drop Left Eye BID  . ferrous sulfate  325 mg Oral QODAY  . hydrALAZINE  100 mg Oral TID  . insulin aspart  0-15 Units  Subcutaneous TID WC  . insulin aspart  0-5 Units Subcutaneous QHS  . insulin glargine  4 Units Subcutaneous Daily  . isosorbide mononitrate  30 mg Oral Daily  . ketorolac  1 drop Right Eye QID  . ofloxacin  1 drop Right Eye TID    BMET    Component Value Date/Time   NA 133 (L) 07/11/2019 1006   NA 140 04/25/2019 1202   K 4.9 07/11/2019 1006   CL 96 (L) 07/11/2019 1006   CO2 22 07/11/2019 1006   GLUCOSE 261 (H) 07/11/2019 1006   BUN 49 (H) 07/11/2019 1006   BUN 71 (H) 04/25/2019 1202   CREATININE 5.21 (H) 07/11/2019 1006   CREATININE 1.87 (H) 05/27/2018 1221   CALCIUM 9.4 07/11/2019 1006   GFRNONAA 8 (L) 07/11/2019 1006   GFRNONAA 31 (L) 12/13/2017 1049   GFRNONAA 43 (L) 07/09/2017 1047   GFRAA 9 (L) 07/11/2019 1006   GFRAA 36 (L) 12/13/2017 1049   GFRAA 50 (L) 07/09/2017 1047   CBC    Component Value Date/Time   WBC 5.1 07/10/2019 0537   RBC 3.22 (L) 07/11/2019 0216   RBC 3.13 (L) 07/10/2019 0537   HGB 7.5 (L) 07/10/2019 0537   HGB 8.4 (L) 01/24/2018 1048   HCT 23.4 (L) 07/10/2019 0537   HCT 27.2 (L) 01/24/2018 1048   PLT 283 07/10/2019 0537   PLT 356 01/24/2018 1048   MCV 74.8 (L) 07/10/2019 0537   MCV 78 (L) 01/24/2018 1048   MCH 24.0 (L) 07/10/2019 0537   MCHC 32.1 07/10/2019 0537   RDW 16.2 (H) 07/10/2019 0537   RDW 17.6 (H) 01/24/2018 1048   LYMPHSABS 0.9 07/09/2019 0448   LYMPHSABS 1.6 01/24/2018 1048   MONOABS 0.9 07/09/2019 0448   EOSABS 0.1 07/09/2019 0448   EOSABS 0.1 01/24/2018 1048   BASOSABS 0.0 07/09/2019 0448   BASOSABS 0.0 01/24/2018 1048    Assessment/Plan:  1. AKI/CKD stage 4 now progressed to ESRD- failed outpatient diuretics and started on HD 07/05/19 and had another session 11/27 and 11/29.  Plan for HD tomorrow. 2. AMS- started after surgery yesterday without significant improvement.  Will follow after HD today. 3. Acute on chronic chf- EF 25-30%.  UF as tolerated with HD 4. DM- per primary 5. A fib- on heparin gtt. Off coumadin  until AVF created 11/30. Resume per pharmacy 6. HTN- stable 7. Anemia of CKD- aranesp 40 mcg 11/23, for next dose today with HD 8. Vascular access- s/p LUE AVF creation and RIJ TDC 07/10/19 by Dr. Scot Dock 9. Disposition- will need to arrange for outpatient HD  Donetta Potts, MD [facility] [phone]

## 2019-07-11 NOTE — Care Management Important Message (Signed)
Important Message  Patient Details  Name: Amber Young MRN: [id number] Date of Birth: 1955-11-29   Medicare Important Message Given:  Yes     Shelda Altes 07/11/2019, 10:54 AM

## 2019-07-11 NOTE — Evaluation (Signed)
Occupational Therapy Evaluation Patient Details Name: Amber Young MRN: [id number] DOB: 09/19/1955 Today's Date: 07/11/2019    History of Present Illness Pt is a 63 y.o. female admitted 06/29/19 with worsening CHF and renal function. Pt transitioned to ESRD and started HD 11/27. S/p tunneled RIJ dialysis cath and LUE AV fistula placement 11/30. PMH includes DM, CVA, HTN, HLD, CKD IV, CHF, afib on Coumadin; of note, recent admission 11/5-11/8 with DKA.   Clinical Impression   Patient is a 63 year old female that lives with her spouse/children in a single level home with 4 steps to enter. Spouse reports when patient feels well she is independent with basic self care tasks, has family support for IADLs, and has been using the walker as needed since last hospitalization. Patient has been confused, restless, agitated today, calling out "help me" and crying. Patient's spouse attempting to console however patient appears quite restless. Patient has been having some confusion per PT notes however appears more confused today. Currently patient is total assist for self are due to AMS, not following directions/simple cues and has even been refusing eating/drinking today. Patient seated in chair upon arrival however sliding forward, requiring repositioning. Patient mod A x2 for safety/physical assist to come to standing position and requires tactile cues to flex at hips and sit back into chair.     Follow Up Recommendations  SNF;Supervision/Assistance - 24 hour;Other (comment)(if cognition improves could progress to [facility])    Equipment Recommendations  Other (comment)(defer to next venue)       Precautions / Restrictions Precautions Precautions: Fall Precaution Comments: RIJ tunneled cath Restrictions Weight Bearing Restrictions: No      Mobility Bed Mobility               General bed mobility comments: patient sitting in chair upon arrival  Transfers Overall transfer level: Needs  assistance Equipment used: 2 person hand held assist Transfers: Sit to/from Stand Sit to Stand: Mod assist;+2 physical assistance;+2 safety/equipment         General transfer comment: patient unable to assist by pushing with her upper extremities to come to standing position, tactile cues to bend at the hips when sitting back into the chair    Balance Overall balance assessment: Needs assistance Sitting-balance support: Feet supported;Bilateral upper extremity supported Sitting balance-Leahy Scale: Poor     Standing balance support: Bilateral upper extremity supported;During functional activity Standing balance-Leahy Scale: Poor Standing balance comment: requires assist x2 with static standing for safety                           ADL either performed or assessed with clinical judgement   ADL Overall ADL's : Needs assistance/impaired Eating/Feeding: Total assistance Eating/Feeding Details (indicate cue type and reason): pt refusing to eat/drink  Grooming: Total assistance   Upper Body Bathing: Total assistance   Lower Body Bathing: Total assistance   Upper Body Dressing : Total assistance   Lower Body Dressing: Total assistance   Toilet Transfer: Moderate assistance;+2 for physical assistance;+2 for safety/equipment Toilet Transfer Details (indicate cue type and reason): simulated with sit to stand from bedside chair, patient requiring increased assistance due to limited ability to follow directions Toileting- Clothing Manipulation and Hygiene: Total assistance       Functional mobility during ADLs: Moderate assistance General ADL Comments: patient currently total A for self care due to altered mental status; patient is unable to follow commands or initiate tasks even with husband present/encouragement.  Pertinent Vitals/Pain Pain Assessment: Faces Faces Pain Scale: No hurt Pain Descriptors / Indicators: Crying;Other (Comment)(difficult  to discern if in pain or d/t AMS)     Hand Dominance Right   Extremity/Trunk Assessment Upper Extremity Assessment Upper Extremity Assessment: Generalized weakness   Lower Extremity Assessment Lower Extremity Assessment: Defer to PT evaluation       Communication Communication Communication: Other (comment)(AMS/crying out saying "help me" unable communicatate needs)   Cognition Arousal/Alertness: (Awake/altered) Behavior During Therapy: Restless;Agitated Overall Cognitive Status: Impaired/Different from baseline Area of Impairment: Orientation;Attention;Following commands;Awareness;Safety/judgement                 Orientation Level: Disoriented to;Person;Place;Time;Situation(unable to answer orientation questions) Current Attention Level: Focused   Following Commands: Follows one step commands inconsistently Safety/Judgement: Decreased awareness of safety;Decreased awareness of deficits Awareness: Intellectual Problem Solving: Slow processing;Decreased initiation;Difficulty sequencing;Requires verbal cues;Requires tactile cues General Comments: patient calling out "help me" and crying, unable to redirect. husband attempting to console patient throughout eval   General Comments  patient's husband present throughout, vocalizes frustration regarding timeliness of patient receiving dialysis. RN made aware.             Home Living Family/patient expects to be discharged to:: Private residence Living Arrangements: Spouse/significant other;Children Available Help at Discharge: Family;Available 24 hours/day Type of Home: House Home Access: Stairs to enter [employer] of Steps: 4   Home Layout: One level               Home Equipment: Walker - 2 wheels   Additional Comments: patient is confused currently, info obtained from patient's spouse and PT eval.       Prior Functioning/Environment Level of Independence: Needs assistance  Gait / Transfers  Assistance Needed: Husband reports pt using RW PRN since recent admission ADL's / Homemaking Assistance Needed: States her daughter/husband assist with IADL's such as cooking, cleaning, medication management   Comments: husband states patient is normally independent with self care "unless she is sick"         OT Problem List: Decreased strength;Decreased range of motion;Impaired balance (sitting and/or standing);Decreased cognition;Decreased knowledge of use of DME or AE;Decreased safety awareness      OT Treatment/Interventions: Self-care/ADL training;Therapeutic exercise;DME and/or AE instruction;Therapeutic activities;Cognitive remediation/compensation;Patient/family education;Balance training    OT Goals(Current goals can be found in the care plan section) Acute Rehab OT Goals Patient Stated Goal: unable to state OT Goal Formulation: Patient unable to participate in goal setting Time For Goal Achievement: 07/25/19 Potential to Achieve Goals: Fair  OT Frequency: Min 2X/week    AM-PAC OT "6 Clicks" Daily Activity     Outcome Measure Help from another person eating meals?: Total Help from another person taking care of personal grooming?: Total Help from another person toileting, which includes using toliet, bedpan, or urinal?: Total Help from another person bathing (including washing, rinsing, drying)?: Total Help from another person to put on and taking off regular upper body clothing?: Total Help from another person to put on and taking off regular lower body clothing?: Total 6 Click Score: 6   End of Session Nurse Communication: Mobility status;Other (comment)(patient's concerns re: dialysis/pt's mentation. )  Activity Tolerance: Treatment limited secondary to agitation Patient left: in chair;with family/visitor present  OT Visit Diagnosis: Unsteadiness on feet (R26.81);Muscle weakness (generalized) (M62.81);Other symptoms and signs involving cognitive function                 Time: [id number] OT Time Calculation (min): 24 min Charges:  OT General  Charges $OT Visit: 1 Visit OT Evaluation $OT Eval Moderate Complexity: [place] OT OT office: [place] 07/11/2019, 1:30 PM

## 2019-07-11 NOTE — Progress Notes (Deleted)
Patient

## 2019-07-11 NOTE — Progress Notes (Signed)
ANTICOAGULATION CONSULT NOTE - Follow Up Consult  Pharmacy Consult for Heparin (Warfarin on hold) Indication: atrial fibrillation  Allergies  Allergen Reactions  . Ace Inhibitors Cough    Patient Measurements: Height: 5\' 7"  (170.2 cm) Weight: 169 lb 14.4 oz (77.1 kg) IBW/kg (Calculated) : 61.6 Heparin Dosing Weight: 77 kg  Vital Signs: Temp: 98.3 F (36.8 C) (12/01 0905) Temp Source: Axillary (12/01 0905) BP: 172/62 (12/01 0905) Pulse Rate: 79 (12/01 0905)  Labs: Recent Labs    07/09/19 0448 07/10/19 0537 07/10/19 2310 07/11/19 0216 07/11/19 1006  HGB 7.6* 7.5*  --   --   --   HCT 23.7* 23.4*  --   --   --   PLT 267 283  --   --   --   LABPROT 15.2 14.2  --  14.1  --   INR 1.2 1.1  --  1.1  --   HEPARINUNFRC 0.54 0.28*  --  0.34 0.47  CREATININE 4.93* 3.69* 4.74* 4.87* 5.21*  TROPONINIHS  --   --  41* 46*  --    Assessment:  63 yo female on warfarin at home for hx Afib (CHADS2VASc = 7). Pharmacy originally consulted bridge IV heparin to Coumadin given sub-therapeutic INR on admit and was at goal on heparin 950 units/hr. All anticoagulation held 11/22 and warfarin reversed with vitamin K and FFP 11/25 for urgent HD line placement 11/25. Heparin was restarted after this with level at goal within 6 hr, but patient had significant oozing from line site so heparin was discontinued again on 11/26.   No longer oozing 11/27, so pharmacy was re-consulted for heparin. Targeting lower end of therapeutic range given recent oozing/bleeding and line placement.  Warfarin held for permanent HD access placement on 11/30.   Heparin level remains therapeutic (0.47) on 1000 units/hr. INR 1.1  No line oozing/bleeding noted.  Goal of Therapy:  INR 2-3 Heparin level 0.3-0.5 units/ml Monitor platelets by anticoagulation protocol: Yes   Plan:   Continue heparin drip at 1000 units/hr.  Daily heparin level, PT/INR and CBC.  Noted plan to resume warfarin on 12/2.  Plavix stopped on  11/26; okay with cardiology per renal note 11/26.  Arty Baumgartner, [place] Pager: [phone] or phone: [phone] 07/11/2019,12:05 PM

## 2019-07-11 NOTE — Progress Notes (Signed)
Physical Therapy Treatment Patient Details Name: Amber Young MRN: [id number] DOB: 1956/04/22 Today's Date: 07/11/2019    History of Present Illness Pt is a 63 y.o. female admitted 06/29/19 with worsening CHF and renal function. Pt transitioned to ESRD and started HD 11/27. S/p tunneled RIJ dialysis cath and LUE AV fistula placement 11/30. PMH includes DM, CVA, HTN, HLD, CKD IV, CHF, afib on Coumadin; of note, recent admission 11/5-11/8 with DKA.   PT Comments    Pt with increased confusion this session compared to yesterday afternoon. Able to stand and take steps with RW and min-modA, but not consistently following commands, continues to cry for "Sam" and "help me!" Pt not answering questions or expressing needs beyond this. Husband present; pt preparing for transfer to HD. Hopeful pt's cognitive status will improve after this; if not, may require SNF-level therapies.    Follow Up Recommendations  SNF;Home health PT;Supervision/Assistance - 24 hour(pending cognitive status)     Equipment Recommendations  None recommended by PT    Recommendations for Other Services       Precautions / Restrictions Precautions Precautions: Fall;Other (comment) Precaution Comments: RIJ tunneled cath, LUE fistula    Mobility  Bed Mobility Overal bed mobility: Needs Assistance Bed Mobility: Sit to Supine;Rolling Rolling: Independent     Sit to supine: Max assist   General bed mobility comments: Pt requiring physical assist to cue return to supine, maxA to assist BLEs and control descent; repositioning self well once supine  Transfers Overall transfer level: Needs assistance Equipment used: Rolling walker (2 wheeled) Transfers: Sit to/from Stand Sit to Stand: Mod assist         General transfer comment: Pt initially reaching for RW but not standing; modA to initiate movement, then pt elevating trunk with BUE support on RW and minA  Ambulation/Gait Ambulation/Gait assistance: Min  assist Gait Distance (Feet): 3 Feet Assistive device: Rolling walker (2 wheeled) Gait Pattern/deviations: Step-to pattern Gait velocity: decreased Gait velocity interpretation: <1.31 ft/sec, indicative of household ambulator General Gait Details: Prolonged time to take steps from bed to recliner with RW, pt requiring repeated, max multimodal cues for sequencing; minA for balance and to navigate RW   Stairs             Wheelchair Mobility    Modified Rankin (Stroke Patients Only)       Balance Overall balance assessment: Needs assistance Sitting-balance support: Feet supported;No upper extremity supported Sitting balance-Leahy Scale: Fair     Standing balance support: Bilateral upper extremity supported;During functional activity Standing balance-Leahy Scale: Poor Standing balance comment: Able to static stand with BUE support without external assist                            Cognition Arousal/Alertness: Awake/alert(altered) Behavior During Therapy: Restless Overall Cognitive Status: Impaired/Different from baseline Area of Impairment: Orientation;Attention;Following commands;Awareness;Safety/judgement                 Orientation Level: Disoriented to;Person;Place;Time;Situation Current Attention Level: Focused   Following Commands: Follows one step commands inconsistently;Follows one step commands with increased time Safety/Judgement: Decreased awareness of safety;Decreased awareness of deficits Awareness: Intellectual Problem Solving: Slow processing;Decreased initiation;Difficulty sequencing;Requires verbal cues;Requires tactile cues General Comments: Pt calling out "help me" and asking for "Sam" (husband reports this is brother's name); when given instruction, will sometimes say "ok" and inconsistently follow simple commands. Not answering questions      Exercises      General Comments General  comments (skin integrity, edema, etc.): RN and  husband present. Pt preparing for transfer to HD      Pertinent Vitals/Pain Pain Assessment: Faces Faces Pain Scale: Hurts a little bit Pain Location: Unable to localize - unsure if crying due to pain or unrelated Pain Descriptors / Indicators: Crying;Other (Comment) Pain Intervention(s): Monitored during session;Repositioned    Home Living                      Prior Function            PT Goals (current goals can now be found in the care plan section) Acute Rehab PT Goals Patient Stated Goal: Home with husband PT Goal Formulation: With family Time For Goal Achievement: 07/25/19 Potential to Achieve Goals: Fair Progress towards PT goals: Not progressing toward goals - comment(Limited by cognition)    Frequency    Min 3X/week      PT Plan Discharge plan needs to be updated    Co-evaluation              AM-PAC PT "6 Clicks" Mobility   Outcome Measure  Help needed turning from your back to your side while in a flat bed without using bedrails?: A Lot Help needed moving from lying on your back to sitting on the side of a flat bed without using bedrails?: A Lot Help needed moving to and from a bed to a chair (including a wheelchair)?: A Lot Help needed standing up from a chair using your arms (e.g., wheelchair or bedside chair)?: A Lot Help needed to walk in hospital room?: A Little Help needed climbing 3-5 steps with a railing? : A Lot 6 Click Score: 13    End of Session   Activity Tolerance: Treatment limited secondary to medical complications (Comment) Patient left: in bed;with call bell/phone within reach;with family/visitor present;with nursing/sitter in room Nurse Communication: Mobility status PT Visit Diagnosis: Other abnormalities of gait and mobility (R26.89)     Time: [id number] PT Time Calculation (min) (ACUTE ONLY): 13 min  Charges:  $Therapeutic Activity: 8-22 mins                    Mabeline Caras, PT, DPT Acute Rehabilitation  Services  Pager [phone] Office Accident 07/11/2019, 5:02 PM

## 2019-07-12 DIAGNOSIS — E440 Moderate protein-calorie malnutrition: Secondary | ICD-10-CM | POA: Insufficient documentation

## 2019-07-12 DIAGNOSIS — D638 Anemia in other chronic diseases classified elsewhere: Secondary | ICD-10-CM

## 2019-07-12 DIAGNOSIS — E118 Type 2 diabetes mellitus with unspecified complications: Secondary | ICD-10-CM

## 2019-07-12 LAB — RENAL FUNCTION PANEL
Albumin: 2.9 g/dL — ABNORMAL LOW (ref 3.5–5.0)
Anion gap: 12 (ref 5–15)
BUN: 22 mg/dL (ref 8–23)
CO2: 24 mmol/L (ref 22–32)
Calcium: 8.5 mg/dL — ABNORMAL LOW (ref 8.9–10.3)
Chloride: 97 mmol/L — ABNORMAL LOW (ref 98–111)
Creatinine, Ser: 3.1 mg/dL — ABNORMAL HIGH (ref 0.44–1.00)
GFR calc Af Amer: 18 mL/min — ABNORMAL LOW (ref >60)
GFR calc non Af Amer: 15 mL/min — ABNORMAL LOW (ref >60)
Glucose, Bld: 128 mg/dL — ABNORMAL HIGH (ref 70–99)
Phosphorus: 3.4 mg/dL (ref 2.5–4.6)
Potassium: 4.3 mmol/L (ref 3.5–5.1)
Sodium: 133 mmol/L — ABNORMAL LOW (ref 135–145)

## 2019-07-12 LAB — PREPARE RBC (CROSSMATCH)

## 2019-07-12 LAB — CBC WITH DIFFERENTIAL/PLATELET
Abs Immature Granulocytes: 0.05 10*3/uL (ref 0.00–0.07)
Basophils Absolute: 0 10*3/uL (ref 0.0–0.1)
Basophils Relative: 0 %
Eosinophils Absolute: 0 10*3/uL (ref 0.0–0.5)
Eosinophils Relative: 0 %
HCT: 22.2 % — ABNORMAL LOW (ref 36.0–46.0)
Hemoglobin: 7.1 g/dL — ABNORMAL LOW (ref 12.0–15.0)
Immature Granulocytes: 1 %
Lymphocytes Relative: 13 %
Lymphs Abs: 1.2 10*3/uL (ref 0.7–4.0)
MCH: 24.2 pg — ABNORMAL LOW (ref 26.0–34.0)
MCHC: 32 g/dL (ref 30.0–36.0)
MCV: 75.8 fL — ABNORMAL LOW (ref 80.0–100.0)
Monocytes Absolute: 1.1 10*3/uL — ABNORMAL HIGH (ref 0.1–1.0)
Monocytes Relative: 12 %
Neutro Abs: 6.9 10*3/uL (ref 1.7–7.7)
Neutrophils Relative %: 74 %
Platelets: 275 10*3/uL (ref 150–400)
RBC: 2.93 MIL/uL — ABNORMAL LOW (ref 3.87–5.11)
RDW: 16.4 % — ABNORMAL HIGH (ref 11.5–15.5)
WBC: 9.2 10*3/uL (ref 4.0–10.5)
nRBC: 0 % (ref 0.0–0.2)

## 2019-07-12 LAB — GLUCOSE, CAPILLARY
Glucose-Capillary: 122 mg/dL — ABNORMAL HIGH (ref 70–99)
Glucose-Capillary: 126 mg/dL — ABNORMAL HIGH (ref 70–99)
Glucose-Capillary: 132 mg/dL — ABNORMAL HIGH (ref 70–99)
Glucose-Capillary: 159 mg/dL — ABNORMAL HIGH (ref 70–99)
Glucose-Capillary: 172 mg/dL — ABNORMAL HIGH (ref 70–99)
Glucose-Capillary: 214 mg/dL — ABNORMAL HIGH (ref 70–99)
Glucose-Capillary: 227 mg/dL — ABNORMAL HIGH (ref 70–99)

## 2019-07-12 LAB — PROTIME-INR
INR: 1.1 (ref 0.8–1.2)
Prothrombin Time: 14 seconds (ref 11.4–15.2)

## 2019-07-12 LAB — HEPARIN LEVEL (UNFRACTIONATED): Heparin Unfractionated: 0.45 IU/mL (ref 0.30–0.70)

## 2019-07-12 MED ORDER — HEPARIN (PORCINE) 25000 UT/250ML-% IV SOLN: 15.0000 [IU]/kg/h | INTRAVENOUS | Status: DC

## 2019-07-12 MED ORDER — RENA-VITE PO TABS
1.0000 | ORAL_TABLET | Freq: Every day | ORAL | Status: DC
  Administered 2019-07-12: 1 via ORAL
  Filled 2019-07-12: qty 1

## 2019-07-12 MED ORDER — WARFARIN SODIUM 7.5 MG PO TABS
7.5000 mg | ORAL_TABLET | Freq: Once | ORAL | Status: AC
  Administered 2019-07-12: 7.5 mg via ORAL
  Filled 2019-07-12: qty 1

## 2019-07-12 MED ORDER — WARFARIN - PHARMACIST DOSING INPATIENT: Freq: Every day | Status: DC

## 2019-07-12 MED ORDER — ENSURE ENLIVE PO LIQD: 237.0000 mL | Freq: Two times a day (BID) | ORAL | Status: DC

## 2019-07-12 MED ORDER — HEPARIN (PORCINE) 25000 UT/250ML-% IV SOLN
1000.0000 [IU]/h | INTRAVENOUS | Status: DC
  Administered 2019-07-12: 1000 [IU]/h via INTRAVENOUS
  Filled 2019-07-12: qty 250

## 2019-07-12 MED ORDER — SODIUM CHLORIDE 0.9% IV SOLUTION
Freq: Once | INTRAVENOUS | Status: AC
  Administered 2019-07-12: 15:00:00 via INTRAVENOUS

## 2019-07-12 NOTE — Progress Notes (Signed)
Blood transfusion started with no reaction noted.

## 2019-07-12 NOTE — Progress Notes (Signed)
Patient ID: Amber Young, female   DOB: 1955/11/24, 63 y.o.   MRN: [id number] S: more awake and alert this morning but did not remember dialysis last night O:BP (!) 147/56 (BP Location: Right Arm)   Pulse 62   Temp 98.6 F (37 C) (Oral)   Resp 17   Ht 5\' 7"  (1.702 m)   Wt 80 kg   SpO2 99%   BMI 27.63 kg/m   Intake/Output Summary (Last 24 hours) at 07/12/2019 1253 Last data filed at 07/12/2019 [id number] Gross per 24 hour  Intake 0 ml  Output 2200 ml  Net -2200 ml   Intake/Output: I/O last 3 completed shifts: In: 137.6 [P.O.:50; I.V.:87.6] Out: 2400 [Urine:400; Other:2000]  Intake/Output this shift:  No intake/output data recorded. Weight change: 1.934 kg Gen: slowed mentation but in NAD CVS: no rub Resp: cta Abd:+BS, soft, NT/ND Ext: trace pretibial edema, LUE AVF +T/B  Recent Labs  Lab 07/06/19 0335 07/07/19 0356 07/08/19 0458  07/09/19 0448 07/10/19 0537 07/10/19 2310 07/11/19 0216 07/11/19 1006 07/11/19 2124 07/12/19 0447  NA 134* 135 134*   < > 133* 136 131* 132* 133* 134* 133*  K 3.5 3.3* 3.7   < > 3.9 3.7 4.8 4.9 4.9 3.8 4.3  CL 95* 95* 97*   < > 96* 98 94* 94* 96* 97* 97*  CO2 26 26 25    < > 24 25 18* 21* 22 24 24   GLUCOSE 116* 93 125*   < > 126* 109* 243* 246* 261* 119* 128*  BUN 57* 66* 42*   < > 51* 30* 42* 43* 49* 18 22  CREATININE 4.45* 4.66* 4.30*   < > 4.93* 3.69* 4.74* 4.87* 5.21* 2.41* 3.10*  ALBUMIN 2.8* 2.9* 2.8*  --  2.8* 2.9*  --  3.1*  --   --  2.9*  CALCIUM 8.4* 8.9 8.7*   < > 8.8* 9.1 9.4 9.3 9.4 8.4* 8.5*  PHOS 3.6 4.8* 4.0  --  5.1* 3.4  --  5.5*  --   --  3.4   < > = values in this interval not displayed.   Liver Function Tests: Recent Labs  Lab 07/10/19 0537 07/11/19 0216 07/12/19 0447  ALBUMIN 2.9* 3.1* 2.9*   No results for input(s): LIPASE, AMYLASE in the last 168 hours. No results for input(s): AMMONIA in the last 168 hours. CBC: Recent Labs  Lab 07/07/19 0356 07/08/19 0458 07/09/19 0448 07/10/19 0537 07/12/19 0447   WBC 5.4 6.2 5.7 5.1 9.2  NEUTROABS  --   --  3.8  --  6.9  HGB 7.5* 7.5* 7.6* 7.5* 7.1*  HCT 23.7* 23.6* 23.7* 23.4* 22.2*  MCV 75.5* 75.2* 75.7* 74.8* 75.8*  PLT 248 254 267 283 275   Cardiac Enzymes: No results for input(s): CKTOTAL, CKMB, CKMBINDEX, TROPONINI in the last 168 hours. CBG: Recent Labs  Lab 07/11/19 2104 07/12/19 0028 07/12/19 0433 07/12/19 0635 07/12/19 1153  GLUCAP 112* 159* 126* 132* 172*    Iron Studies:  Recent Labs    07/11/19 0216  IRON 21*  TIBC 382  FERRITIN 46   Studies/Results: No results found. . sodium chloride   Intravenous Once  . amLODipine  10 mg Oral Daily  . atorvastatin  40 mg Oral q1800  . brimonidine  1 drop Left Eye BID  . carvedilol  25 mg Oral BID  . Chlorhexidine Gluconate Cloth  6 each Topical Daily  . Chlorhexidine Gluconate Cloth  6 each Topical Q0600  . Chlorhexidine Gluconate  Cloth  6 each Topical [id number]  . darbepoetin (ARANESP) injection - DIALYSIS  100 mcg Intravenous Q Tue-HD  . dorzolamide-timolol  1 drop Left Eye BID  . ferrous sulfate  325 mg Oral QODAY  . hydrALAZINE  100 mg Oral TID  . insulin aspart  0-15 Units Subcutaneous TID WC  . insulin aspart  0-5 Units Subcutaneous QHS  . insulin glargine  4 Units Subcutaneous Daily  . isosorbide mononitrate  30 mg Oral Daily  . ketorolac  1 drop Right Eye QID  . ofloxacin  1 drop Right Eye TID    BMET    Component Value Date/Time   NA 133 (L) 07/12/2019 0447   NA 140 04/25/2019 1202   K 4.3 07/12/2019 0447   CL 97 (L) 07/12/2019 0447   CO2 24 07/12/2019 0447   GLUCOSE 128 (H) 07/12/2019 0447   BUN 22 07/12/2019 0447   BUN 71 (H) 04/25/2019 1202   CREATININE 3.10 (H) 07/12/2019 0447   CREATININE 1.87 (H) 05/27/2018 1221   CALCIUM 8.5 (L) 07/12/2019 0447   GFRNONAA 15 (L) 07/12/2019 0447   GFRNONAA 31 (L) 12/13/2017 1049   GFRNONAA 43 (L) 07/09/2017 1047   GFRAA 18 (L) 07/12/2019 0447   GFRAA 36 (L) 12/13/2017 1049   GFRAA 50 (L) 07/09/2017 1047    CBC    Component Value Date/Time   WBC 9.2 07/12/2019 0447   RBC 2.93 (L) 07/12/2019 0447   HGB 7.1 (L) 07/12/2019 0447   HGB 8.4 (L) 01/24/2018 1048   HCT 22.2 (L) 07/12/2019 0447   HCT 27.2 (L) 01/24/2018 1048   PLT 275 07/12/2019 0447   PLT 356 01/24/2018 1048   MCV 75.8 (L) 07/12/2019 0447   MCV 78 (L) 01/24/2018 1048   MCH 24.2 (L) 07/12/2019 0447   MCHC 32.0 07/12/2019 0447   RDW 16.4 (H) 07/12/2019 0447   RDW 17.6 (H) 01/24/2018 1048   LYMPHSABS 1.2 07/12/2019 0447   LYMPHSABS 1.6 01/24/2018 1048   MONOABS 1.1 (H) 07/12/2019 0447   EOSABS 0.0 07/12/2019 0447   EOSABS 0.1 01/24/2018 1048   BASOSABS 0.0 07/12/2019 0447   BASOSABS 0.0 01/24/2018 1048    Assessment/Plan:  1. AKI/CKD stage 4 now progressed to ESRD- failed outpatient diuretics and started on HD 07/05/19 and had another session 11/27 and 11/29. tolerated HD well on 07/11/19 1. Will continue on TTS schedule for now. 2. Awaiting outpatient slot 2. AMS- started after surgery yesterday without significant improvement.  Improved this morning.  Likely related to anesthesia for AVF creation.   3. Acute on chronic chf- EF 25-30%. UF as tolerated with HD 4. DM- per primary 5. A fib- on heparin gtt. Off coumadin until AVF created 11/30. Resume per pharmacy 6. HTN- stable 7. Anemia of CKD- aranesp 40 mcg 11/23, for next dose today with HD 8. Vascular access- s/p LUE AVF creation and RIJ TDC 07/10/19 by Dr. Scot Dock 9. Disposition- will need to arrange for outpatient HD  Donetta Potts, MD [facility] [phone]

## 2019-07-12 NOTE — Plan of Care (Signed)

## 2019-07-12 NOTE — Progress Notes (Addendum)
ANTICOAGULATION CONSULT NOTE - Follow Up Consult  Pharmacy Consult for Heparin and Warfarin Indication: atrial fibrillation  Allergies  Allergen Reactions  . Ace Inhibitors Cough    Patient Measurements: Height: 5\' 7"  (170.2 cm) Weight: 176 lb 6.4 oz (80 kg) IBW/kg (Calculated) : 61.6 Heparin Dosing Weight: 77 kg  Vital Signs: Temp: 98.6 F (37 C) (12/02 1112) Temp Source: Oral (12/02 1112) BP: 147/56 (12/02 1112) Pulse Rate: 62 (12/02 1112)  Labs: Recent Labs    07/10/19 0537 07/10/19 2310 07/11/19 0216 07/11/19 1006 07/11/19 2124 07/12/19 0447  HGB 7.5*  --   --   --   --  7.1*  HCT 23.4*  --   --   --   --  22.2*  PLT 283  --   --   --   --  275  LABPROT 14.2  --  14.1  --   --  14.0  INR 1.1  --  1.1  --   --  1.1  HEPARINUNFRC 0.28*  --  0.34 0.47  --  0.45  CREATININE 3.69* 4.74* 4.87* 5.21* 2.41* 3.10*  TROPONINIHS  --  41* 46*  --   --   --    Assessment:  63 yo female on warfarin at home for hx Afib (CHADS2VASc = 7). Pharmacy originally consulted bridge IV heparin to Coumadin given sub-therapeutic INR on admit and was at goal on heparin 950 units/hr. All anticoagulation held 11/22 and warfarin reversed with vitamin K and FFP 11/25 for urgent HD line placement 11/25. Heparin was restarted after this with level at goal within 6 hr, but patient had significant oozing from line site so heparin was discontinued again on 11/26.   No longer oozing 11/27, so pharmacy was re-consulted for heparin. Targeting lower end of therapeutic range given recent oozing/bleeding and line placement.  Warfarin held for permanent HD access placement on 11/30.   Heparin level remains therapeutic (0.45) on 1000 units/hr. INR 1.1  Hgb trended down to 7.1 > transfusing today. No line oozing/bleeding reported.   Addendum: warfarin to resume today.    PTA regimen:  7.5 mg MWF, 5 mg TTSS.  Goal of Therapy:  INR 2-3 Heparin level 0.3-0.5 units/ml Monitor platelets by anticoagulation  protocol: Yes   Plan:   Continue heparin drip at 1000 units/hr.  Daily heparin level, PT/INR and CBC.  Noted plan to resume warfarin on 12/2. Will follow up.  Plavix stopped on 11/26; okay with cardiology per renal note 11/26.  Arty Baumgartner, [place] Pager: [phone] or phone: [phone] 07/12/2019,11:40 AM   Addendum: warfarin to resume today.    PTA regimen:  7.5 mg MWF, 5 mg TTSS,  Will resume with Warfarin 7.5 mg tonight.  Consuello Masse, RPh 07/12/2019 3:25 PM

## 2019-07-12 NOTE — Progress Notes (Addendum)
PROGRESS NOTE  Amber Young [id number] DOB: 11/11/1955 DOA: 06/29/2019 PCP: Amber Glatter, FNP  Brief History   Patient with h/o DM; CVA (2016); HTN; HLD; CAD s/p stent (2019); stage IV CKD; chronic combined CHF; and afib on Coumadin who was last admitted to Amber Young service from 11/5-8 with DKA.  She was admitted as a direct admission to cardiology on 11/19 with worsening CHF, renal function worsening, volume overload.  Her renal function has not improved and she transitioned to ESRD and started HD on 07/07/2019.  AV fistula to be placed Monday 07/10/2019.  Cardiology prefers to have Amber Young assume care since her volume status will now be optimized through HD.  Amber Young to assume care on 11/28.  Nephrology is consulted as is vascular surgery. The patient had a tunnelled catheter and permanent access placement on 07/10/2019. Coumadin is held and the patient is being bridged on heparin gtt for stroke prophylaxis pending procedure on Monday. Plavix has been discontinued. [facility] for resumption of plavix and coumadin. Pt had altered mental status following the procedure on 07/10/2019. This was found to be due to DKA. This has resolved. Will resume coumadin today. Management is as per pharmacy.  Consultants  . Vascular surgery . Interventional Radiology . Nephrology . Cardiology  Procedures  . Hemodialysis . Tunnelled catheter placement  Antibiotics   Anti-infectives (From admission, onward)   Start     Dose/Rate Route Frequency Ordered Stop   07/10/19 0600  cefUROXime (ZINACEF) 1.5 g in sodium chloride 0.9 % 100 mL IVPB     1.5 g 200 mL/hr over 30 Minutes Intravenous On call to O.R. 07/09/19 0731 07/11/19 0559   07/07/19 1020  vancomycin (VANCOCIN) 1-5 GM/200ML-% IVPB    Note to Pharmacy: Murriel Hopper   : cabinet override      07/07/19 1020 07/07/19 2229   07/05/19 1100  ceFAZolin (ANCEF) IVPB 2g/100 mL premix     2 g 200 mL/hr over 30 Minutes Intravenous To Radiology 07/05/19 0954  07/06/19 1100     Subjective  The patient is resting comfortably. No new complaints.   Objective   Vitals:  Vitals:   07/12/19 0354 07/12/19 1112  BP: (!) 169/69 (!) 147/56  Pulse: 79 62  Resp: 20 17  Temp:  98.6 F (37 C)  SpO2: 100% 99%   Exam:  Constitutional:  . The patient is awake, alert, and oriented x 3. No acute distress. Respiratory:  . No increased work of breathing. . No wheezes, rales, or rhonchi . No tactile fremitus Cardiovascular:  . Regular rate and rhythm . No murmurs, ectopy, or gallups. . No lateral PMI. No thrills. Abdomen:  . Abdomen is soft, non-tender, non-distended . No hernias, masses, or organomegaly . Normoactive bowel sounds.  Musculoskeletal:  . No cyanosis or clubbing . +2 pitting edema bilaterally. Skin:  . No rashes, lesions, ulcers . palpation of skin: no induration or nodules Neurologic:  . CN 2-12 intact . Sensation all 4 extremities intact Psychiatric:  . Mental status o Mood, affect appropriate o Orientation to person, place, time  . judgment and insight appear intact  I have personally reviewed the following:   Today's Data  . Vitals, BMP, CBC  Scheduled Meds: . sodium chloride   Intravenous Once  . amLODipine  10 mg Oral Daily  . atorvastatin  40 mg Oral q1800  . brimonidine  1 drop Left Eye BID  . carvedilol  25 mg Oral BID  . Chlorhexidine Gluconate Cloth  6  each Topical Daily  . Chlorhexidine Gluconate Cloth  6 each Topical Q0600  . Chlorhexidine Gluconate Cloth  6 each Topical Q0600  . darbepoetin (ARANESP) injection - DIALYSIS  100 mcg Intravenous Q Tue-HD  . dorzolamide-timolol  1 drop Left Eye BID  . ferrous sulfate  325 mg Oral QODAY  . hydrALAZINE  100 mg Oral TID  . insulin aspart  0-15 Units Subcutaneous TID WC  . insulin aspart  0-5 Units Subcutaneous QHS  . insulin glargine  4 Units Subcutaneous Daily  . isosorbide mononitrate  30 mg Oral Daily  . ketorolac  1 drop Right Eye QID  . ofloxacin   1 drop Right Eye TID   Continuous Infusions: . sodium chloride Stopped (07/10/19 0923)  . heparin 1,000 Units/hr (07/11/19 1613)    Active Problems:   Acute on chronic combined systolic and diastolic CHF (congestive heart failure) (HCC)   CKD (chronic kidney disease), stage V (Amber Young)   LOS: 13 days   A & P  Acute on chronic systolic and diastolic heart failure: LVEF this admission 35-40%, up from 25-30% 01/2019.  Prior to that it was 40-45% 07/2018.  She remains volume overloaded but much better. Volume management is now largely by HD and nephrology. She has been continued on carvedilol, hydralazine and Imdur. The recommendation from cardiology is to replace her CCB with ARB if okay with nephrology. Tunnelled catheter placed on 07/10/2019.  Essential hypertension: BP with improved control. Continue amlodipine, carvedilol, Imdur, and hydralazine. The recommendation from cardiology is to replace her CCB with ARB if okay with nephrology.  Acute on chronic renal failure: Started HD 11/25.  Fistula placement/tunnelled catheter on Monday. HD tomorrow.   Anemia: Multifactorial. Post op in setting of anemia of chronic renal disease. The patient will be transfused with 1 unit PRBC's.  Paroxysmal atrial fibrillation: Currently in sinus rhythm. Coumadin will be restarted and managed by pharmacy. Heparin is used to bridge. Continue carvedilol.   CAD s/p PCI: LAD PCI in 2019.  She is on clopidogrel and warfarin, but no ASA. Plavix and warfarain are held. Continue atorvastatin and beta blocker. Resume plavix when okay with vascular surgery/nephrology.  Prior CVA: OK to hold clopidogrel due to  bleeding and pending fistula placement on 07/10/2019.  I have seen and examined this patient myself. I have spent 36 minutes in her evaluation and care.  [name], DO [facility] Direct contact: see [email or web address]  7PM-7AM contact night coverage as above 07/12/2019, 2:36 PM  LOS: 9 days

## 2019-07-12 NOTE — Progress Notes (Addendum)
Initial Nutrition Assessment  DOCUMENTATION CODES:   Non-severe (moderate) malnutrition in context of chronic illness  INTERVENTION:  Ensure Enlive BID (each supplement provides 350 calories and 20 grams of protein)  Recommend liberalize diet to remove renal restriction and encourage PO intake  Recommend starting bowel regimen per MD  Provide rena-vite daily at bedtime  NUTRITION DIAGNOSIS:   Moderate Malnutrition related to chronic illness(ESRD) as evidenced by mild fat depletion, mild muscle depletion.  GOAL:   Patient will meet greater than or equal to 90% of their needs  MONITOR:   PO intake, Supplement acceptance, Weight trends, Skin, I & O's  REASON FOR ASSESSMENT:   Consult Diet education, Assessment of nutrition requirement/status, Poor PO  ASSESSMENT:   63 yo female presented on 06/29/2019 with SOB and CHF exacerbation, PMH including CKD IV, DM, PAD, HTN, GERD, DKA.  Pt now progressed to ESRD with plans to start on outpatient HD  S/p insertion of tunneled dialysis catheter (right neck and AV fistula creation (left arm upper)  Patient was lethargic and slightly confused at time of assessment. Reports having a poor appetite while in the hospital but that she "ate pretty good" PTA. States that she would typically eat only two meals per day but couldn't articulate which meals those were or what she would typically eat at  Those meals. Pt asked if she could have cabbage because she loves cabbage and was told that cabbage would be acceptable for her to eat on her diet.  Pt states that her UBW is 180# and says that she has noticed weight loss in her legs and feet, but could not articulate the time frame in which she noticed this weight loss. Per documented weight history, weights are trending downward with 9% wt loss in 6 months which is not significant for the time frame but concerning given her current diagnosis of ESRD and CHF. Unsure of EDW because of extent of edema  present.  Last documented BM of 11/29, pt could not remember when last BM was, and stated that it was not yesterday or today, RD recommending starting bowel regimen per MD.  Pt requested RD come back tomorrow when her husband is present to give diet education.  Labs reviewed include: CBG 126-172; sodium 133; chloride 97; creat 3.1; phos 3.4; potassium 4.3 Medications reviewed include: iron; insulin 0-15 units; lantus 4 units; heparin  NUTRITION - FOCUSED PHYSICAL EXAM:    Most Recent Value  Orbital Region  Mild depletion  Upper Arm Region  Moderate depletion  Thoracic and Lumbar Region  No depletion  Buccal Region  Mild depletion  Temple Region  Mild depletion  Clavicle Bone Region  Mild depletion  Clavicle and Acromion Bone Region  Mild depletion  Scapular Bone Region  No depletion  Dorsal Hand  Mild depletion  Patellar Region  Mild depletion  Anterior Thigh Region  Mild depletion  Posterior Calf Region  Mild depletion  Edema (RD Assessment)  Moderate  Hair  Reviewed  Eyes  Reviewed  Mouth  Reviewed  Skin  Reviewed  Nails  Reviewed       Diet Order:   Diet Order            Diet renal/carb modified with fluid restriction Diet-HS Snack? Nothing; Fluid restriction: 1200 mL Fluid; Room service appropriate? Yes; Fluid consistency: Thin  Diet effective now              EDUCATION NEEDS:   Education needs have been addressed  Skin:  Skin Assessment: Skin Integrity Issues: Skin Integrity Issues:: Incisions Incisions: left arm, right neck  Last BM:  11/29  Height:   Ht Readings from Last 1 Encounters:  06/29/19 5\' 7"  (1.702 m)    Weight:   Wt Readings from Last 1 Encounters:  07/12/19 80 kg    Ideal Body Weight:  61.3 kg  BMI:  Body mass index is 27.63 kg/m.  Estimated Nutritional Needs:   Kcal:  1800-2000  Protein:  95-105 grams  Fluid:  1035mL + urine output  Meda Klinefelter, Dietetic Intern

## 2019-07-12 NOTE — Progress Notes (Signed)
Renal Navigator spoke with patient's husband regarding need for OP HD treatment referral for ESRD given patient's ongoing confusion. Patient's husband appreciative of call and stated great concern for his wife. He states he will transport patient to OP HD once patient is discharged. Referral submitted to [facility] [place], clinic closest to patient's home. Renal Navigator will follow closely.  Alphonzo Cruise, [place] Renal Navigator [phone]

## 2019-07-13 DIAGNOSIS — E440 Moderate protein-calorie malnutrition: Secondary | ICD-10-CM

## 2019-07-13 DIAGNOSIS — E1122 Type 2 diabetes mellitus with diabetic chronic kidney disease: Secondary | ICD-10-CM | POA: Diagnosis not present

## 2019-07-13 DIAGNOSIS — Z992 Dependence on renal dialysis: Secondary | ICD-10-CM | POA: Diagnosis not present

## 2019-07-13 DIAGNOSIS — N186 End stage renal disease: Secondary | ICD-10-CM | POA: Diagnosis not present

## 2019-07-13 LAB — BPAM RBC
Blood Product Expiration Date: 202012242359
ISSUE DATE / TIME: 202012021456
Unit Type and Rh: 6200

## 2019-07-13 LAB — RENAL FUNCTION PANEL
Albumin: 3 g/dL — ABNORMAL LOW (ref 3.5–5.0)
Anion gap: 11 (ref 5–15)
BUN: 38 mg/dL — ABNORMAL HIGH (ref 8–23)
CO2: 26 mmol/L (ref 22–32)
Calcium: 8.7 mg/dL — ABNORMAL LOW (ref 8.9–10.3)
Chloride: 94 mmol/L — ABNORMAL LOW (ref 98–111)
Creatinine, Ser: 4.59 mg/dL — ABNORMAL HIGH (ref 0.44–1.00)
GFR calc Af Amer: 11 mL/min — ABNORMAL LOW (ref >60)
GFR calc non Af Amer: 9 mL/min — ABNORMAL LOW (ref >60)
Glucose, Bld: 166 mg/dL — ABNORMAL HIGH (ref 70–99)
Phosphorus: 4.4 mg/dL (ref 2.5–4.6)
Potassium: 3.9 mmol/L (ref 3.5–5.1)
Sodium: 131 mmol/L — ABNORMAL LOW (ref 135–145)

## 2019-07-13 LAB — CBC WITH DIFFERENTIAL/PLATELET
Abs Immature Granulocytes: 0.06 10*3/uL (ref 0.00–0.07)
Basophils Absolute: 0 10*3/uL (ref 0.0–0.1)
Basophils Relative: 0 %
Eosinophils Absolute: 0.1 10*3/uL (ref 0.0–0.5)
Eosinophils Relative: 1 %
HCT: 26.6 % — ABNORMAL LOW (ref 36.0–46.0)
Hemoglobin: 8.4 g/dL — ABNORMAL LOW (ref 12.0–15.0)
Immature Granulocytes: 1 %
Lymphocytes Relative: 18 %
Lymphs Abs: 1.7 10*3/uL (ref 0.7–4.0)
MCH: 24.9 pg — ABNORMAL LOW (ref 26.0–34.0)
MCHC: 31.6 g/dL (ref 30.0–36.0)
MCV: 78.9 fL — ABNORMAL LOW (ref 80.0–100.0)
Monocytes Absolute: 1 10*3/uL (ref 0.1–1.0)
Monocytes Relative: 11 %
Neutro Abs: 6.6 10*3/uL (ref 1.7–7.7)
Neutrophils Relative %: 69 %
Platelets: 309 10*3/uL (ref 150–400)
RBC: 3.37 MIL/uL — ABNORMAL LOW (ref 3.87–5.11)
RDW: 16.8 % — ABNORMAL HIGH (ref 11.5–15.5)
WBC: 9.4 10*3/uL (ref 4.0–10.5)
nRBC: 0.4 % — ABNORMAL HIGH (ref 0.0–0.2)

## 2019-07-13 LAB — TYPE AND SCREEN
ABO/RH(D): A POS
Antibody Screen: NEGATIVE
Unit division: 0

## 2019-07-13 LAB — PROTIME-INR
INR: 1.1 (ref 0.8–1.2)
Prothrombin Time: 14 seconds (ref 11.4–15.2)

## 2019-07-13 LAB — GLUCOSE, CAPILLARY
Glucose-Capillary: 224 mg/dL — ABNORMAL HIGH (ref 70–99)
Glucose-Capillary: 179 mg/dL — ABNORMAL HIGH (ref 70–99)
Glucose-Capillary: 168 mg/dL — ABNORMAL HIGH (ref 70–99)

## 2019-07-13 LAB — HEPARIN LEVEL (UNFRACTIONATED): Heparin Unfractionated: 0.34 IU/mL (ref 0.30–0.70)

## 2019-07-13 MED ORDER — HEPARIN SODIUM (PORCINE) 1000 UNIT/ML IJ SOLN
INTRAMUSCULAR | Status: AC
  Administered 2019-07-13: 3400 [IU]
  Filled 2019-07-13: qty 4

## 2019-07-13 MED ORDER — ENSURE ENLIVE PO LIQD: 237.0000 mL | Freq: Two times a day (BID) | ORAL | 12 refills | Status: AC

## 2019-07-13 MED ORDER — RENA-VITE PO TABS: 1.0000 | ORAL_TABLET | Freq: Every day | ORAL | 0 refills | Status: AC

## 2019-07-13 MED ORDER — ISOSORBIDE MONONITRATE ER 30 MG PO TB24: 30.0000 mg | ORAL_TABLET | Freq: Every day | ORAL | 0 refills | Status: AC

## 2019-07-13 MED ORDER — ALPRAZOLAM 0.25 MG PO TABS: 0.2500 mg | ORAL_TABLET | Freq: Two times a day (BID) | ORAL | 0 refills | Status: AC | PRN

## 2019-07-13 MED ORDER — HEPARIN SODIUM (PORCINE) 1000 UNIT/ML DIALYSIS: 20.0000 [IU]/kg | INTRAMUSCULAR | Status: DC | PRN

## 2019-07-13 MED ORDER — WARFARIN SODIUM 7.5 MG PO TABS
7.5000 mg | ORAL_TABLET | Freq: Once | ORAL | Status: DC
  Filled 2019-07-13: qty 1

## 2019-07-13 NOTE — Progress Notes (Signed)
PT Cancellation Note  Patient Details Name: Chanteal Deol MRN: [id number] DOB: Dec 31, 1955   Cancelled Treatment:    Reason Eval/Treat Not Completed: Patient at procedure or test/unavailable (HD). Will follow-up for PT treatment as schedule permits.  Mabeline Caras, PT, DPT Acute Rehabilitation Services  Pager [phone] Office [place] 07/13/2019, 8:02 AM

## 2019-07-13 NOTE — Progress Notes (Signed)
Physical Therapy Treatment Patient Details Name: Amber Young MRN: [id number] DOB: 03-Apr-1956 Today's Date: 07/13/2019    History of Present Illness Pt is a 63 y.o. female admitted 06/29/19 with worsening CHF and renal function. Pt transitioned to ESRD and started HD 11/27. S/p tunneled RIJ dialysis cath and LUE AV fistula placement 11/30. PMH includes DM, CVA, HTN, HLD, CKD IV, CHF, afib on Coumadin; of note, recent admission 11/5-11/8 with DKA.   PT Comments    Pt with significant improvement in cognition this session. Able to perform transfers and ambulate with RW at supervision-level; remains limited by decreased activity tolerance. Preparing for d/c home this afternoon; recommend continued HHPT services to maximize functional mobility and independence.    Follow Up Recommendations  Home health PT;Supervision for mobility/OOB     Equipment Recommendations  None recommended by PT    Recommendations for Other Services       Precautions / Restrictions Precautions Precautions: Fall;Other (comment) Precaution Comments: RIJ tunneled cath, LUE fistula Restrictions Weight Bearing Restrictions: No    Mobility  Bed Mobility               General bed mobility comments: Received sitting in recliner  Transfers Overall transfer level: Needs assistance Equipment used: Rolling walker (2 wheeled) Transfers: Sit to/from Stand Sit to Stand: Supervision         General transfer comment: Increased time and effort, 2x sit<>stand to RW from recliner, heavy reliance on BUE support, supervision for balance  Ambulation/Gait Ambulation/Gait assistance: Supervision Gait Distance (Feet): 200 Feet Assistive device: Rolling walker (2 wheeled) Gait Pattern/deviations: Step-through pattern;Decreased stride length Gait velocity: Decreased Gait velocity interpretation: 1.31 - 2.62 ft/sec, indicative of limited community ambulator General Gait Details: Slow, steady gait with RW and  supervision for safety; intermittent standing rest breaks secondary to c/o BLE fatigue   Stairs             Wheelchair Mobility    Modified Rankin (Stroke Patients Only)       Balance Overall balance assessment: Needs assistance Sitting-balance support: Feet supported;No upper extremity supported Sitting balance-Leahy Scale: Good       Standing balance-Leahy Scale: Fair Standing balance comment: Able to static stand without BUE support                            Cognition Arousal/Alertness: Awake/alert Behavior During Therapy: WFL for tasks assessed/performed;Flat affect Overall Cognitive Status: Impaired/Different from baseline Area of Impairment: Attention;Following commands;Awareness;Problem solving                   Current Attention Level: Selective   Following Commands: Follows one step commands consistently   Awareness: Emergent Problem Solving: Slow processing;Requires verbal cues General Comments: Cognition much improved since previous sessions. Following commands and interacting appropriately; some flat affect with slowed processing noted still      Exercises      General Comments        Pertinent Vitals/Pain Pain Assessment: Faces Faces Pain Scale: Hurts a little bit Pain Location: BUE Pain Descriptors / Indicators: Sore Pain Intervention(s): Monitored during session    Home Living                      Prior Function            PT Goals (current goals can now be found in the care plan section) Progress towards PT goals: Progressing toward goals  Frequency    Min 3X/week      PT Plan Discharge plan needs to be updated    Co-evaluation              AM-PAC PT "6 Clicks" Mobility   Outcome Measure  Help needed turning from your back to your side while in a flat bed without using bedrails?: A Little Help needed moving from lying on your back to sitting on the side of a flat bed without using  bedrails?: A Little Help needed moving to and from a bed to a chair (including a wheelchair)?: A Little Help needed standing up from a chair using your arms (e.g., wheelchair or bedside chair)?: None Help needed to walk in hospital room?: A Little Help needed climbing 3-5 steps with a railing? : A Little 6 Click Score: 19    End of Session   Activity Tolerance: Patient tolerated treatment well Patient left: in chair;with call bell/phone within reach Nurse Communication: Mobility status PT Visit Diagnosis: Other abnormalities of gait and mobility (R26.89)     Time: [id number] PT Time Calculation (min) (ACUTE ONLY): 20 min  Charges:  $Therapeutic Activity: 8-22 mins                    Mabeline Caras, PT, DPT Acute Rehabilitation Services  Pager [phone] Office [place] 07/13/2019, 2:29 PM

## 2019-07-13 NOTE — TOC Transition Note (Signed)
Transition of Care [facility]) - CM/SW Discharge Note   Patient Details  Name: Amber Young MRN: [id number] Date of Birth: July 25, 1956  [facility] CM/SW Contact:  [name], RN Phone Number: 07/13/2019, 2:51 PM   Clinical Narrative:    Patient is for dc today, NCM notified Cassie with [facility] and also notified MD to put orders in.   Final next level of care: Day Barriers to Discharge: No Barriers Identified   Patient Goals and CMS Choice Patient states their goals for this hospitalization and ongoing recovery are:: get well CMS Medicare.gov Compare Post Acute Care list provided to:: Patient Choice offered to / list presented to : Patient  Discharge Placement                       Discharge Plan and Services   Discharge Planning Services: CM Consult Post Acute Care Choice: Home Health, Resumption of Svcs/PTA Provider          DME Arranged: (NA)         HH Arranged: RN, PT, OT HH Agency: [facility] Date HH Agency Contacted: 07/13/19 Time [place]: [place] Representative spoke with at [place] ([place]) Interventions     Readmission Risk Interventions Readmission Risk Prevention Plan 07/03/2019  Transportation Screening Complete  Medication Review Press photographer) Complete  PCP or Specialist appointment within 3-5 days of discharge Complete  HRI or [place] Complete  SW Recovery Care/Counseling Consult Complete  [place] Not Applicable  Some recent data might be hidden

## 2019-07-13 NOTE — Progress Notes (Signed)
Renal Navigator notes discharge order and notified Nephrologist of plan for discharge today. Renal Navigator notified OP HD clinic of plan for patient to start in the clinic tomorrow, 07/14/19.  Alphonzo Cruise, [place] Renal Navigator [phone]

## 2019-07-13 NOTE — Procedures (Signed)
I was present at this dialysis session. I have reviewed the session itself and made appropriate changes.   Vital signs in last 24 hours:  Temp:  [97.5 F (36.4 C)-98.6 F (37 C)] 98.4 F (36.9 C) (12/03 0735) Pulse Rate:  [61-81] 62 (12/03 0830) Resp:  [12-18] 12 (12/03 0735) BP: (134-150)/(56-73) 150/73 (12/03 0830) SpO2:  [96 %-100 %] 100 % (12/03 0735) Weight:  [74.7 kg] 74.7 kg (12/03 0735) Weight change: -4.292 kg Filed Weights   07/12/19 0354 07/13/19 0413 07/13/19 0735  Weight: 80 kg 74.7 kg 74.7 kg    Recent Labs  Lab 07/12/19 0447  NA 133*  K 4.3  CL 97*  CO2 24  GLUCOSE 128*  BUN 22  CREATININE 3.10*  CALCIUM 8.5*  PHOS 3.4    Recent Labs  Lab 07/09/19 0448 07/10/19 0537 07/12/19 0447 07/13/19 0500  WBC 5.7 5.1 9.2 9.4  NEUTROABS 3.8  --  6.9 6.6  HGB 7.6* 7.5* 7.1* 8.4*  HCT 23.7* 23.4* 22.2* 26.6*  MCV 75.7* 74.8* 75.8* 78.9*  PLT 267 283 275 309    Scheduled Meds: . amLODipine  10 mg Oral Daily  . atorvastatin  40 mg Oral q1800  . brimonidine  1 drop Left Eye BID  . carvedilol  25 mg Oral BID  . Chlorhexidine Gluconate Cloth  6 each Topical Daily  . Chlorhexidine Gluconate Cloth  6 each Topical Q0600  . Chlorhexidine Gluconate Cloth  6 each Topical Q0600  . darbepoetin (ARANESP) injection - DIALYSIS  100 mcg Intravenous Q Tue-HD  . dorzolamide-timolol  1 drop Left Eye BID  . feeding supplement (ENSURE ENLIVE)  237 mL Oral BID BM  . ferrous sulfate  325 mg Oral QODAY  . hydrALAZINE  100 mg Oral TID  . insulin aspart  0-15 Units Subcutaneous TID WC  . insulin aspart  0-5 Units Subcutaneous QHS  . insulin glargine  4 Units Subcutaneous Daily  . isosorbide mononitrate  30 mg Oral Daily  . ketorolac  1 drop Right Eye QID  . multivitamin  1 tablet Oral QHS  . ofloxacin  1 drop Right Eye TID  . Warfarin - Pharmacist Dosing Inpatient   Does not apply q1800   Continuous Infusions: . sodium chloride Stopped (07/10/19 0923)  . heparin 1,000  Units/hr (07/12/19 1836)   PRN Meds:.sodium chloride, acetaminophen, ALPRAZolam, dextrose, heparin, nitroGLYCERIN, ondansetron (ZOFRAN) IV, oxyCODONE-acetaminophen, zolpidem    Assessment/Plan:  1. AKI/CKD stage 4 now progressed to ESRD- failed outpatient diuretics and started on HD 07/05/19 and had another session 11/27 and 11/29. tolerated HD well on 07/11/19 1. Will continue on TTS schedule for now. 2. Awaiting outpatient slot 2. AMS- started after surgery yesterday without significant improvement. More awake and alert this morning.  Likely related to anesthesia for AVF creation.   3. Acute on chronic chf- EF 25-30%. UF as tolerated with HD 4. DM- per primary 5. A fib- on heparin gtt. Off coumadin until AVF created11/30. Resume per pharmacy 6. HTN- stable 7. Anemia of CKD- aranesp 40 mcg 11/23, for next dose todaywith HD 8. Vascular access- s/p LUE AVF creation and RIJ TDC 07/10/19 by Dr. Scot Dock 9. Disposition- will need to arrange for outpatient HD  Donetta Potts,  MD 07/13/2019, 8:46 AM

## 2019-07-13 NOTE — Progress Notes (Signed)
Patient has been accepted at [facility] for OP HD treatment on a MWF schedule with a seat time of 1:20pm. She needs to arrive to her appointments at 1:00pm. On her first day of treatment, she needs to arrive at 12:15pm to complete intake paperwork. Navigator notified Nephrologist/Dr. Marval Regal of patient's schedule. Renal Navigator left message for patient's husband and met with patient at HD bedside with schedule letter.  Alphonzo Cruise, [place] Renal Navigator [phone]

## 2019-07-13 NOTE — Progress Notes (Signed)
ANTICOAGULATION CONSULT NOTE - Follow Up Consult  Pharmacy Consult for Heparin and Warfarin Indication: atrial fibrillation  Allergies  Allergen Reactions  . Ace Inhibitors Cough    Patient Measurements: Height: 5\' 7"  (170.2 cm) Weight: 164 lb 10.9 oz (74.7 kg) IBW/kg (Calculated) : 61.6 Heparin Dosing Weight: 77 kg  Vital Signs: Temp: 98.4 F (36.9 C) (12/03 0735) Temp Source: Oral (12/03 0735) BP: 168/78 (12/03 1030) Pulse Rate: 71 (12/03 1030)  Labs: Recent Labs    07/10/19 2310  07/11/19 0216 07/11/19 1006 07/11/19 2124 07/12/19 0447 07/13/19 0500 07/13/19 0850  HGB  --   --   --   --   --  7.1* 8.4*  --   HCT  --   --   --   --   --  22.2* 26.6*  --   PLT  --   --   --   --   --  275 309  --   LABPROT  --   --  14.1  --   --  14.0  --  14.0  INR  --   --  1.1  --   --  1.1  --  1.1  HEPARINUNFRC  --    < > 0.34 0.47  --  0.45  --  0.34  CREATININE 4.74*  --  4.87* 5.21* 2.41* 3.10* 4.59*  --   TROPONINIHS 41*  --  46*  --   --   --   --   --    < > = values in this interval not displayed.   Assessment:  63 yo female on warfarin at home for hx Afib (CHADS2VASc = 7). Pharmacy originally consulted bridge IV heparin to Coumadin given sub-therapeutic INR on admit and was at goal on heparin 950 units/hr. All anticoagulation held 11/22 and warfarin reversed with vitamin K and FFP 11/25 for urgent HD line placement 11/25. Heparin was restarted after this with level at goal within 6 hr, but patient had significant oozing from line site so heparin was discontinued again on 11/26.   No longer oozing 11/27, so pharmacy was re-consulted for heparin. Targeting lower end of therapeutic range given recent oozing/bleeding and line placement.  Warfarin held for permanent HD access placement on 11/30.  Heparin level remains therapeutic (0.34) on 1000 units/hr. INR 1.1 Hgb  8.4 > s/p transfusion. No line oozing/bleeding reported. Warfarin also is resuming, INR 1.1 after one dose    PTA regimen:  7.5 mg MWF, 5 mg TTSS.  Goal of Therapy:  INR 2-3 Heparin level 0.3-0.5 units/ml Monitor platelets by anticoagulation protocol: Yes   Plan:  Continue heparin drip at 1000 units/hr. Warfarin 7.5mg  PO x 1  Daily heparin level, PT/INR and CBC. Plavix stopped on 11/26; okay with cardiology per renal note 11/26.  Narcissa Melder A. Levada Dy, PharmD, BCPS, FNKF Clinical Pharmacist [place] Please utilize Amion for appropriate phone number to reach the unit pharmacist ([place])  07/13/2019 11:04 AM

## 2019-07-13 NOTE — Plan of Care (Signed)

## 2019-07-13 NOTE — Plan of Care (Signed)
Nutrition Education Note  RD was consulted for nutrition education regarding Renal Diet education for new HD patient. Pt was given "Phosphorus and your CKD Diet" and "Potassium in your CKD Diet" handouts. Pt was educated on a brief pathophysiology of CKD and why it is important to know how much sodium, potassium and phosphorus she is consuming. Pt was educated on protein intake for patients on HD and on how to take her renal multivitamin. Pt verbalized understanding. Teach back method utilized. Expect good compliance.  Body mass index is 25.79 kg/m.  Patient meets criteria for overweight status based on current BMI Current diet order is Heart healthy Pt. Is consuming approximately 75%  of meals at this time. Labs and medications reviewed. No further nutrition interventions warranted at this time. RD contact information provided. If additional nutrition issues arise, please re-consult RD

## 2019-07-14 DIAGNOSIS — D509 Iron deficiency anemia, unspecified: Secondary | ICD-10-CM | POA: Diagnosis not present

## 2019-07-14 DIAGNOSIS — D631 Anemia in chronic kidney disease: Secondary | ICD-10-CM | POA: Diagnosis not present

## 2019-07-14 DIAGNOSIS — T7840XA Allergy, unspecified, initial encounter: Secondary | ICD-10-CM | POA: Insufficient documentation

## 2019-07-14 DIAGNOSIS — D688 Other specified coagulation defects: Secondary | ICD-10-CM | POA: Insufficient documentation

## 2019-07-14 DIAGNOSIS — N186 End stage renal disease: Secondary | ICD-10-CM | POA: Insufficient documentation

## 2019-07-14 DIAGNOSIS — T829XXA Unspecified complication of cardiac and vascular prosthetic device, implant and graft, initial encounter: Secondary | ICD-10-CM | POA: Insufficient documentation

## 2019-07-14 DIAGNOSIS — E1129 Type 2 diabetes mellitus with other diabetic kidney complication: Secondary | ICD-10-CM | POA: Diagnosis not present

## 2019-07-14 DIAGNOSIS — Z23 Encounter for immunization: Secondary | ICD-10-CM | POA: Diagnosis not present

## 2019-07-14 DIAGNOSIS — Z992 Dependence on renal dialysis: Secondary | ICD-10-CM | POA: Diagnosis not present

## 2019-07-14 DIAGNOSIS — N2581 Secondary hyperparathyroidism of renal origin: Secondary | ICD-10-CM | POA: Diagnosis not present

## 2019-07-16 NOTE — Discharge Summary (Signed)
Physician Discharge Summary  Amber Young [id number] DOB: 08-05-1956 DOA: 06/29/2019  PCP: Azzie Glatter, FNP  Admit date: 06/29/2019 Discharge date: 07/16/2019  Recommendations for Outpatient Follow-up:  1. Follow up with PCP in 7-10 days. 2. PT/INR to be drawn on 07/17/2019 and reported to PCP. 3. Follow up with Nephrology as directed. 4. Keep dialysis appointment.  Follow-up Information    [facility] Follow up.   Specialty: Home Health Services Why: [facility], HHPT, [place] resume Contact information: [place] [id number] [phone]        Skeet Latch, MD Follow up.   Specialty: Cardiology Why: Our office will call you to schedule follow-up visit. Contact information: [address] [place] [address] [phone]        [facility] In 6 weeks.   Specialty: Vascular Surgery Why: Office will call you to arrange your appt (sent) Contact information: [address] [place] [phone]         Discharge Diagnoses: Principal diagnosis is #1 1. Acute on chronic systolic and diastolic heart failure 2. Essential hypertension 3. Acute on chronic renal failure 4. Anemia - multifactorial 5. Paroxysmal atrial fibrillation 6. CAD s/p PCI 7. Prior CVA  Discharge Condition: Fair  Disposition: Home  Diet recommendation: Heart healthy  Filed Weights   07/12/19 0354 07/13/19 0413 07/13/19 0735  Weight: 80 kg 74.7 kg 74.7 kg    History of present illness:   Amber Young is a 63 y.o. female with medical history significant of anemia, chronic combined systolic and diastolic congestive heart failure with EF 25-30% on echo done 01/2019, paroxysmal atrial fibrillation on Coumadin, CKD stage IV, CAD status post PCI, hypertension, hyperlipidemia, CVA, non-insulin-dependent diabetes mellitus, GERD presenting to the ED with generalized weakness.  Patient is not able  to contribute to the history.  She is oriented to self only.  Replying to every question by saying "no."  History provided by daughter at bedside.  Daughter states since Monday patient has been very lethargic and not eating.  She has not taken any of her medications since then.  She has not had any fevers, cough, shortness of breath, or complained of chest pain or any other pain.  States up until yesterday patient's mental status was okay but today she has been more confused.  She has not had any falls or head injury.  She takes Metformin for diabetes but has not taken it since Monday.  She was previously on glipizide but daughter states it was stopped during another hospitalization.  Daughter checks her blood glucose daily but has not checked it since Monday because the patient has not been eating.  Hospital Course:  Patient with h/o DM; CVA (2016); HTN; HLD; CAD s/p stent (2019); stage IV CKD; chronic combined CHF; and afib on Coumadin who was last admitted to [facility] service from 11/5-8 with DKA. She was admitted as a direct admission to cardiology on 11/19with worsening CHF, renal function worsening, volume overload. Her renal function has not improved and she transitioned to ESRD and started HD on 07/07/2019. AV fistula to beplaced Monday 07/10/2019. Cardiology prefers to have [facility] assume care since her volume status will now be optimized through HD.[facility] to assume care on 11/28.  Nephrology is consulted as is vascular surgery. The patient had a tunnelled catheter and permanent access placement on 07/10/2019. Coumadin is held and the patient is being bridged on heparin gtt for stroke prophylaxis pending procedure on Monday.  Plavix has been discontinued. [facility] for resumption of plavix and coumadin. Pt had altered mental status following the procedure on 07/10/2019. This was found to be due to DKA. This has resolved. Will resume coumadin today. Management is as per pharmacy.  The patient will be  discharged to home today in fair condition.  Today's assessment: S: The patient is resting comfortably. No new complaints. O: Vitals:  Vitals:   07/13/19 1118 07/13/19 1212  BP: (!) 168/41 (!) 168/58  Pulse: 66 85  Resp: 14 18  Temp: 98.6 F (37 C)   SpO2: 99% 100%   Constitutional:   The patient is awake, alert, and oriented x 3. No acute distress. Respiratory:   No increased work of breathing.  No wheezes, rales, or rhonchi  No tactile fremitus Cardiovascular:   Regular rate and rhythm  No murmurs, ectopy, or gallups.  No lateral PMI. No thrills. Abdomen:   Abdomen is soft, non-tender, non-distended  No hernias, masses, or organomegaly  Normoactive bowel sounds.  Musculoskeletal:   No cyanosis or clubbing  +2 pitting edema bilaterally. Skin:   No rashes, lesions, ulcers  palpation of skin: no induration or nodules Neurologic:   CN 2-12 intact  Sensation all 4 extremities intact Psychiatric:   Mental status ? Mood, affect appropriate ? Orientation to person, place, time   judgment and insight appear intact   Discharge Instructions  Discharge Instructions    (HEART FAILURE PATIENTS) Call MD:  Anytime you have any of the following symptoms: 1) 3 pound weight gain in 24 hours or 5 pounds in 1 week 2) shortness of breath, with or without a dry hacking cough 3) swelling in the hands, feet or stomach 4) if you have to sleep on extra pillows at night in order to breathe.   Complete by: As directed    Activity as tolerated - No restrictions   Complete by: As directed    Call MD for:  difficulty breathing, headache or visual disturbances   Complete by: As directed    Call MD for:  extreme fatigue   Complete by: As directed    Diet - low sodium heart healthy   Complete by: As directed    Discharge instructions   Complete by: As directed    Follow up with PCP in 7-10 days. PT/INR to be drawn on 07/17/2019 and reported to PCP. Follow up with  Nephrology as directed. Keep dialysis appointment.   Increase activity slowly   Complete by: As directed      Allergies as of 07/13/2019      Reactions   Ace Inhibitors Cough      Medication List    STOP taking these medications   furosemide 80 MG tablet Commonly known as: LASIX   isosorbide dinitrate 20 MG tablet Commonly known as: ISORDIL     TAKE these medications   acetaminophen 500 MG tablet Commonly known as: TYLENOL Take 2 tablets (1,000 mg total) by mouth every 6 (six) hours as needed for mild pain or headache.   ALPRAZolam 0.25 MG tablet Commonly known as: XANAX Take 1 tablet (0.25 mg total) by mouth 2 (two) times daily as needed for anxiety.   amLODipine 10 MG tablet Commonly known as: NORVASC Take 1 tablet (10 mg total) by mouth daily.   atorvastatin 40 MG tablet Commonly known as: LIPITOR Take 1 tablet (40 mg total) by mouth daily at 6 PM.   brimonidine 0.2 % ophthalmic solution Commonly known as: [place]  1 drop into the left eye 2 (two) times daily.   carvedilol 25 MG tablet Commonly known as: COREG Take 1 tablet (25 mg total) by mouth 2 (two) times daily.   dorzolamide-timolol 22.3-6.8 MG/ML ophthalmic solution Commonly known as: COSOPT Place 1 drop into the left eye 2 (two) times daily.   feeding supplement (ENSURE ENLIVE) Liqd Take 237 mLs by mouth 2 (two) times daily between meals.   ferrous sulfate 325 (65 FE) MG tablet Commonly known as: FerrouSul Take 1 tablet (325 mg total) by mouth every other day.   glipiZIDE 10 MG tablet Commonly known as: GLUCOTROL Take 1 tablet (10 mg total) by mouth 2 (two) times daily before a meal.   hydrALAZINE 100 MG tablet Commonly known as: APRESOLINE Take 1 tablet (100 mg total) by mouth 3 (three) times daily.   Insulin Glargine 100 UNIT/ML Solostar Pen Commonly known as: LANTUS Inject 4 Units into the skin daily.   isosorbide mononitrate 30 MG 24 hr tablet Commonly known as: IMDUR Take 1  tablet (30 mg total) by mouth daily.   ketorolac 0.4 % Soln Commonly known as: ACULAR Place 1 drop into the right eye 4 (four) times daily.   multivitamin Tabs tablet Take 1 tablet by mouth at bedtime.   nitroGLYCERIN 0.4 MG SL tablet Commonly known as: NITROSTAT Place 1 tablet (0.4 mg total) under the tongue every 5 (five) minutes x 3 doses as needed for chest pain.   ofloxacin 0.3 % ophthalmic solution Commonly known as: OCUFLOX Place 1 drop into the right eye 3 (three) times daily.   ticagrelor 90 MG Tabs tablet Commonly known as: BRILINTA Take 1 tablet (90 mg total) by mouth 2 (two) times daily.   TRUEplus Pen Needles 31G X 6 MM Misc Generic drug: Insulin Pen Needle USE AS DIRECTED   warfarin 5 MG tablet Commonly known as: COUMADIN Take as directed. If you are unsure how to take this medication, talk to your nurse or doctor. Original instructions: Take 5mg  daily except for 7.5mg  every Monday, Wednesday and Friday What changed:   how much to take  how to take this  when to take this      Allergies  Allergen Reactions   Ace Inhibitors Cough    The results of significant diagnostics from this hospitalization (including imaging, microbiology, ancillary and laboratory) are listed below for reference.    Significant Diagnostic Studies: Dg Chest 2 View  Result Date: 06/29/2019 CLINICAL DATA:  Shortness of breath EXAM: CHEST - 2 VIEW COMPARISON:  June 15, 2019 FINDINGS: No new consolidation or edema. No pleural effusion or pneumothorax. Stable mild cardiomegaly. Coronary artery stent is noted. No acute osseous abnormality. IMPRESSION: No acute process in the chest.  Stable mild cardiomegaly. Electronically Signed   By: Macy Mis M.D.   On: 06/29/2019 17:15   Mr Brain Wo Contrast  Addendum Date: 06/17/2019   ADDENDUM REPORT: 06/17/2019 18:38 ADDENDUM: Upon further review, there are few punctate foci of diffusion weighted hyperintensity within the anterior  right corona radiata, some of which demonstrate intermediate signal on the ADC map, and are suspicious for subacute ischemic change. Additionally, there is patchy restricted diffusion with the splenium of the corpus callosum. Although nonspecific, this may reflect a cytotoxic lesion of the corpus callosum. This has a broad differential of causes, one of which is diabetic ketoacidosis. Addended findings called by telephone at the time of interpretation on 06/17/2019 at 6:31 pm to provider Dr. Maylene Roes, who verbally acknowledged these  results. Electronically Signed   By: Kellie Simmering DO   On: 06/17/2019 18:38   Result Date: 06/17/2019 CLINICAL DATA:  Encephalopathy. Additional history provided: Altered mental status since Sunday. Found in diabetic ketoacidosis. EXAM: MRI HEAD WITHOUT CONTRAST TECHNIQUE: Multiplanar, multiecho pulse sequences of the brain and surrounding structures were obtained without intravenous contrast. COMPARISON:  Head CT 06/16/2019 FINDINGS: Multiple sequences are significantly motion degraded, limiting evaluation. Most notably, there is moderate motion degradation of the coronal diffusion-weighted imaging, severe motion degradation of the susceptibility weighted imaging, moderate motion degradation of the axial and sagittal T1 weighted imaging. Brain: There is no convincing evidence of acute infarct. No evidence of intracranial mass. No midline shift or extra-axial fluid collection. Severe motion degradation of the susceptibility weighted imaging limits evaluation for chronic intracranial blood products. Redemonstrated chronic lacunar infarct within the right thalamus. There is a background of moderate chronic small vessel ischemic disease. Mild generalized parenchymal atrophy. Vascular: Flow voids maintained within the proximal large arterial vessels. Skull and upper cervical spine: No focal marrow lesion identified on motion degraded imaging. Sinuses/Orbits: Visualized orbits demonstrate no  acute abnormality. Minimal ethmoid sinus mucosal thickening. No significant mastoid effusion. IMPRESSION: 1. Motion degraded and limited examination as described. 2. No evidence of acute intracranial abnormality. 3. Generalized parenchymal atrophy with moderate chronic small vessel ischemic disease. Redemonstrated chronic right thalamic lacunar infarct. Electronically Signed: By: Kellie Simmering DO On: 06/17/2019 12:59   Nm Hepatobiliary Liver Func  Result Date: 06/16/2019 CLINICAL DATA:  Incidentally noted cholelithiasis and gallbladder wall thickening on renal ultrasound. EXAM: NUCLEAR MEDICINE HEPATOBILIARY IMAGING TECHNIQUE: Sequential images of the abdomen were obtained out to 60 minutes following intravenous administration of radiopharmaceutical. RADIOPHARMACEUTICALS:  5.26 mCi Tc-71m  Choletec IV COMPARISON:  Same-day renal ultrasound FINDINGS: Prompt uptake and biliary excretion of activity by the liver is seen. Gallbladder activity is visualized, consistent with patency of cystic duct. Biliary activity passes into small bowel, consistent with patent common bile duct. Some reflux of tracer into the stomach is noted. IMPRESSION: No scintigraphic evidence of acute cholecystitis. Electronically Signed   By: Lovena Le M.D.   On: 06/16/2019 19:47   Ir Fluoro Guide Cv Line Right  Result Date: 07/05/2019 CLINICAL DATA:  Renal failure and need for hemodialysis catheter. EXAM: NON-TUNNELED CENTRAL VENOUS HEMODIALYSIS CATHETER PLACEMENT WITH ULTRASOUND AND FLUOROSCOPIC GUIDANCE FLUOROSCOPY TIME:  6 seconds.  0.2 mGy. PROCEDURE: The procedure, risks, benefits, and alternatives were explained to the patient. Questions regarding the procedure were encouraged and answered. The patient understands and consents to the procedure. A time-out was performed prior to initiating the procedure. The right neck and chest were prepped with chlorhexidine in a sterile fashion, and a sterile drape was applied covering the  operative field. Maximum barrier sterile technique with sterile gowns and gloves were used for the procedure. Local anesthesia was provided with 1% lidocaine. Ultrasound was used to confirm patency of the right internal jugular vein. After creating a small venotomy incision, a 21 gauge needle was advanced into the right internal jugular vein under direct, real-time ultrasound guidance. Ultrasound image documentation was performed. After securing guidewire access, the venotomy was dilated. A triple-lumen, 16 cm, 13 [place] Mah[place]kar catheter was advanced over the wire. Final catheter positioning was confirmed and documented with a fluoroscopic spot image. The catheter was aspirated, flushed with saline, and injected with appropriate volume heparin dwells. The catheter exit site was secured with 0-Prolene retention sutures. COMPLICATIONS: None.  No pneumothorax. FINDINGS: After catheter placement, the tip  lies at the cavoatrial junction. The catheter aspirates normally and is ready for immediate use. IMPRESSION: Placement of non-tunneled central venous hemodialysis catheter via the right internal jugular vein. The catheter tip lies at the cavoatrial junction. The catheter is ready for immediate use. Electronically Signed   By: Aletta Edouard M.D.   On: 07/05/2019 17:59   Ir US Guide Vasc Access Right  Result Date: 07/05/2019 CLINICAL DATA:  Renal failure and need for hemodialysis catheter. EXAM: NON-TUNNELED CENTRAL VENOUS HEMODIALYSIS CATHETER PLACEMENT WITH ULTRASOUND AND FLUOROSCOPIC GUIDANCE FLUOROSCOPY TIME:  6 seconds.  0.2 mGy. PROCEDURE: The procedure, risks, benefits, and alternatives were explained to the patient. Questions regarding the procedure were encouraged and answered. The patient understands and consents to the procedure. A time-out was performed prior to initiating the procedure. The right neck and chest were prepped with chlorhexidine in a sterile fashion, and a sterile drape was applied  covering the operative field. Maximum barrier sterile technique with sterile gowns and gloves were used for the procedure. Local anesthesia was provided with 1% lidocaine. Ultrasound was used to confirm patency of the right internal jugular vein. After creating a small venotomy incision, a 21 gauge needle was advanced into the right internal jugular vein under direct, real-time ultrasound guidance. Ultrasound image documentation was performed. After securing guidewire access, the venotomy was dilated. A triple-lumen, 16 cm, 13 [place] Mahurkar catheter was advanced over the wire. Final catheter positioning was confirmed and documented with a fluoroscopic spot image. The catheter was aspirated, flushed with saline, and injected with appropriate volume heparin dwells. The catheter exit site was secured with 0-Prolene retention sutures. COMPLICATIONS: None.  No pneumothorax. FINDINGS: After catheter placement, the tip lies at the cavoatrial junction. The catheter aspirates normally and is ready for immediate use. IMPRESSION: Placement of non-tunneled central venous hemodialysis catheter via the right internal jugular vein. The catheter tip lies at the cavoatrial junction. The catheter is ready for immediate use. Electronically Signed   By: Aletta Edouard M.D.   On: 07/05/2019 17:59   Dg Chest Port 1 View  Result Date: 07/10/2019 CLINICAL DATA:  Status post placement of dialysis catheter. EXAM: PORTABLE CHEST 1 VIEW COMPARISON:  Two-view chest x-ray 08/28/2018. Fluoroscopic procedure images 07/05/19. FINDINGS: The heart is scratched at the heart size is exaggerated by low lung volumes. Increased diffuse interstitial pattern is present. Asymmetric opacities are present at the right base. There is no pneumothorax. Right IJ dialysis catheter tip terminates at the right atrium. IMPRESSION: 1. Right IJ dialysis catheter tip terminates at the right atrium. 2. No pneumothorax. 3. Increased interstitial pattern compatible  with edema. 4. Asymmetric airspace disease at the right base. While this likely represents atelectasis, infection is not excluded. Electronically Signed   By: San Morelle M.D.   On: 07/10/2019 09:51   Dg Fluoro Guide Cv Line-no Report  Result Date: 07/10/2019 Fluoroscopy was utilized by the requesting physician.  No radiographic interpretation.   Vas [place] Upper Ext Vein Mapping (pre-op Avf)  Result Date: 07/08/2019 UPPER EXTREMITY VEIN MAPPING  Indications: Pre-access. History: CKD.  Comparison Study: No prior study. Performing Technologist: Maudry Mayhew MHA, RDMS, RVT, RDCS  Examination Guidelines: A complete evaluation includes B-mode imaging, spectral Doppler, color Doppler, and power Doppler as needed of all accessible portions of each vessel. Bilateral testing is considered an integral part of a complete examination. Limited examinations for reoccurring indications may be performed as noted. +-----------------+-------------+----------+---------+  Right Cephalic    Diameter (cm) Depth (cm) Findings   +-----------------+-------------+----------+---------+  Shoulder              0.15         0.59               +-----------------+-------------+----------+---------+  Prox upper arm        0.17         0.57               +-----------------+-------------+----------+---------+  Mid upper arm         0.22         0.62               +-----------------+-------------+----------+---------+  Dist upper arm        0.23         0.60               +-----------------+-------------+----------+---------+  Antecubital fossa     0.20         0.23    branching  +-----------------+-------------+----------+---------+  Prox forearm          0.17         0.51    branching  +-----------------+-------------+----------+---------+  Mid forearm           0.18         0.58               +-----------------+-------------+----------+---------+  Dist forearm          0.17         0.48                +-----------------+-------------+----------+---------+  Wrist                 0.11         0.17               +-----------------+-------------+----------+---------+ +-----------------+-------------+----------+--------------+  Right Basilic     Diameter (cm) Depth (cm)    Findings     +-----------------+-------------+----------+--------------+  Prox upper arm        0.30                                 +-----------------+-------------+----------+--------------+  Mid upper arm         0.28                                 +-----------------+-------------+----------+--------------+  Dist upper arm        0.34                                 +-----------------+-------------+----------+--------------+  Antecubital fossa     0.33                                 +-----------------+-------------+----------+--------------+  Prox forearm          0.27                                 +-----------------+-------------+----------+--------------+  Mid forearm           0.29                                 +-----------------+-------------+----------+--------------+  Distal forearm                             not visualized  +-----------------+-------------+----------+--------------+  Wrist                                      not visualized  +-----------------+-------------+----------+--------------+ +-----------------+-------------+----------+--------------+  Left Cephalic     Diameter (cm) Depth (cm)    Findings     +-----------------+-------------+----------+--------------+  Shoulder              0.16         0.63                    +-----------------+-------------+----------+--------------+  Prox upper arm        0.15         0.66                    +-----------------+-------------+----------+--------------+  Mid upper arm         0.19         0.52                    +-----------------+-------------+----------+--------------+  Dist upper arm        0.24         0.43                     +-----------------+-------------+----------+--------------+  Antecubital fossa     0.38         0.32                    +-----------------+-------------+----------+--------------+  Prox forearm          0.25         0.49      branching     +-----------------+-------------+----------+--------------+  Mid forearm           0.09         0.28      branching     +-----------------+-------------+----------+--------------+  Dist forearm          0.08         0.20                    +-----------------+-------------+----------+--------------+  Wrist                                      not visualized  +-----------------+-------------+----------+--------------+ +-----------------+-------------+----------+--------------+  Left Basilic      Diameter (cm) Depth (cm)    Findings     +-----------------+-------------+----------+--------------+  Mid upper arm         0.36                                 +-----------------+-------------+----------+--------------+  Dist upper arm        0.35                                 +-----------------+-------------+----------+--------------+  Antecubital fossa     0.23                   branching     +-----------------+-------------+----------+--------------+  Prox forearm          0.18                                 +-----------------+-------------+----------+--------------+  Mid forearm                                not visualized  +-----------------+-------------+----------+--------------+  Distal forearm                             not visualized  +-----------------+-------------+----------+--------------+  Wrist                                      not visualized  +-----------------+-------------+----------+--------------+ *See table(s) above for measurements and observations.  Diagnosing physician: Curt Jews MD Electronically signed by Curt Jews MD on 07/08/2019 at 9:23:21 AM.    Final     Microbiology: Recent Results (from the past 240 hour(s))  Surgical pcr screen     Status:  None   Collection Time: 07/10/19  4:33 AM   Specimen: Nasal Mucosa; Nasal Swab  Result Value Ref Range Status   MRSA, PCR NEGATIVE NEGATIVE Final   Staphylococcus aureus NEGATIVE NEGATIVE Final    Comment: (NOTE) The Xpert SA Assay (FDA approved for NASAL specimens in patients 66 years of age and older), is one component of a comprehensive surveillance program. It is not intended to diagnose infection nor to guide or monitor treatment. Performed at [facility], [place] [address] [place], [place] [address]      Labs: Basic Metabolic Panel: Recent Labs  Lab 07/10/19 0537 07/10/19 2310 07/11/19 0216 07/11/19 1006 07/11/19 2124 07/12/19 0447 07/13/19 0500  NA 136 131* 132* 133* 134* 133* 131*  K 3.7 4.8 4.9 4.9 3.8 4.3 3.9  CL 98 94* 94* 96* 97* 97* 94*  CO2 25 18* 21* 22 24 24 26   GLUCOSE 109* 243* 246* 261* 119* 128* 166*  BUN 30* 42* 43* 49* 18 22 38*  CREATININE 3.69* 4.74* 4.87* 5.21* 2.41* 3.10* 4.59*  CALCIUM 9.1 9.4 9.3 9.4 8.4* 8.5* 8.7*  MG  --  2.1  --   --   --   --   --   PHOS 3.4  --  5.5*  --   --  3.4 4.4   Liver Function Tests: Recent Labs  Lab 07/10/19 0537 07/11/19 0216 07/12/19 0447 07/13/19 0500  ALBUMIN 2.9* 3.1* 2.9* 3.0*   No results for input(s): LIPASE, AMYLASE in the last 168 hours. No results for input(s): AMMONIA in the last 168 hours. CBC: Recent Labs  Lab 07/10/19 0537 07/12/19 0447 07/13/19 0500  WBC 5.1 9.2 9.4  NEUTROABS  --  6.9 6.6  HGB 7.5* 7.1* 8.4*  HCT 23.4* 22.2* 26.6*  MCV 74.8* 75.8* 78.9*  PLT 283 275 309   Cardiac Enzymes: No results for input(s): CKTOTAL, CKMB, CKMBINDEX, TROPONINI in the last 168 hours. BNP: BNP (last 3 results) Recent Labs    07/20/18 1347 01/23/19 1715 06/29/19 2211  BNP 918.5* 2,666.4* 2,340.4*    ProBNP (last 3 results) No results for input(s): PROBNP in the last 8760 hours.  CBG: Recent Labs  Lab 07/12/19 1624 07/12/19 2055 07/13/19 0027 07/13/19 0402 07/13/19 [id number]  GLUCAP 214* 227* 224* 179* 168*    Active Problems:   Acute on chronic combined systolic and diastolic CHF (congestive heart failure) (HCC)   CKD (chronic kidney disease), stage V (HCC)   Malnutrition of moderate degree   Time coordinating discharge: 38 minute.  Signed:        Sigmond Patalano, DO [facility]  07/16/2019, 1:13 PM

## 2019-07-17 ENCOUNTER — Telehealth: Payer: Self-pay | Admitting: Cardiovascular Disease

## 2019-07-17 DIAGNOSIS — N186 End stage renal disease: Secondary | ICD-10-CM | POA: Diagnosis not present

## 2019-07-17 DIAGNOSIS — E1129 Type 2 diabetes mellitus with other diabetic kidney complication: Secondary | ICD-10-CM | POA: Diagnosis not present

## 2019-07-17 DIAGNOSIS — D631 Anemia in chronic kidney disease: Secondary | ICD-10-CM | POA: Diagnosis not present

## 2019-07-17 DIAGNOSIS — Z992 Dependence on renal dialysis: Secondary | ICD-10-CM | POA: Diagnosis not present

## 2019-07-17 DIAGNOSIS — N2581 Secondary hyperparathyroidism of renal origin: Secondary | ICD-10-CM | POA: Diagnosis not present

## 2019-07-17 DIAGNOSIS — D509 Iron deficiency anemia, unspecified: Secondary | ICD-10-CM | POA: Diagnosis not present

## 2019-07-17 MED FILL — RENA-VITE TABLET: 30 days supply | Qty: 30 | Fill #0

## 2019-07-17 NOTE — Telephone Encounter (Signed)
Called [facility] back. Did not see matolazone on the discharge papers or med list. Advised to not take medication if not in discharge instructions. Will route to Dr Oval Linsey for further review.

## 2019-07-17 NOTE — Telephone Encounter (Signed)
Follow the discharge meds.  No metolazone.  Her volume status will be managed with dialysis now instead of fluid pills.

## 2019-07-17 NOTE — Telephone Encounter (Signed)
Called [facility] back to give Dr Blenda Mounts advice. Left message.

## 2019-07-17 NOTE — Telephone Encounter (Signed)
Mo from [facility] would like orders to do an INR check for the patient while she is at the house

## 2019-07-17 NOTE — Telephone Encounter (Signed)
Pt c/o medication issue:  1. Name of Medication: metolazone (ZAROXOLYN) 2.5 MG tablet  2. How are you currently taking this medication (dosage and times per day)? Patient is currently not taking this medicine   3. Are you having a reaction (difficulty breathing--STAT)? no  4. What is your medication issue? Family wanted to make sure that the patient was in fact supposed to stop taking this medication and all diuretics.  The patient was recently discharged from the hospital. According to the daughter, the discharge paperwork stated that the patient needed to stop the lasix and it was just discussed that the patient stop the metolazone. The daughter said there were no documented instructions in the discharge papers to stop this medicine. The home Health Nurse just needs clarification on whether or not the patient should be taking this medication.  The family puts all of the patient's pills in a pill box and just needs clarification before filling the next round of pills

## 2019-07-17 NOTE — Telephone Encounter (Signed)
Left detailed message "verbal order " that INR may  be checked while at pt's home ./cy

## 2019-07-18 ENCOUNTER — Ambulatory Visit (INDEPENDENT_AMBULATORY_CARE_PROVIDER_SITE_OTHER): Payer: Medicare Other | Admitting: Pharmacist

## 2019-07-18 DIAGNOSIS — Z7901 Long term (current) use of anticoagulants: Secondary | ICD-10-CM | POA: Diagnosis not present

## 2019-07-18 DIAGNOSIS — I480 Paroxysmal atrial fibrillation: Secondary | ICD-10-CM

## 2019-07-18 LAB — POCT INR: INR: 2.9 (ref 2.0–3.0)

## 2019-07-19 DIAGNOSIS — N2581 Secondary hyperparathyroidism of renal origin: Secondary | ICD-10-CM | POA: Diagnosis not present

## 2019-07-19 DIAGNOSIS — Z992 Dependence on renal dialysis: Secondary | ICD-10-CM | POA: Diagnosis not present

## 2019-07-19 DIAGNOSIS — D631 Anemia in chronic kidney disease: Secondary | ICD-10-CM | POA: Diagnosis not present

## 2019-07-19 DIAGNOSIS — D509 Iron deficiency anemia, unspecified: Secondary | ICD-10-CM | POA: Diagnosis not present

## 2019-07-19 DIAGNOSIS — E1129 Type 2 diabetes mellitus with other diabetic kidney complication: Secondary | ICD-10-CM | POA: Diagnosis not present

## 2019-07-19 DIAGNOSIS — N186 End stage renal disease: Secondary | ICD-10-CM | POA: Diagnosis not present

## 2019-07-20 ENCOUNTER — Encounter: Payer: Self-pay | Admitting: Pharmacist

## 2019-07-20 ENCOUNTER — Telehealth: Payer: Self-pay | Admitting: Family Medicine

## 2019-07-20 DIAGNOSIS — Z794 Long term (current) use of insulin: Secondary | ICD-10-CM | POA: Diagnosis not present

## 2019-07-20 DIAGNOSIS — D631 Anemia in chronic kidney disease: Secondary | ICD-10-CM | POA: Insufficient documentation

## 2019-07-20 DIAGNOSIS — E1122 Type 2 diabetes mellitus with diabetic chronic kidney disease: Secondary | ICD-10-CM | POA: Diagnosis not present

## 2019-07-20 DIAGNOSIS — N184 Chronic kidney disease, stage 4 (severe): Secondary | ICD-10-CM | POA: Diagnosis not present

## 2019-07-20 DIAGNOSIS — I2510 Atherosclerotic heart disease of native coronary artery without angina pectoris: Secondary | ICD-10-CM | POA: Diagnosis not present

## 2019-07-20 DIAGNOSIS — I480 Paroxysmal atrial fibrillation: Secondary | ICD-10-CM

## 2019-07-20 DIAGNOSIS — M6281 Muscle weakness (generalized): Secondary | ICD-10-CM | POA: Diagnosis not present

## 2019-07-20 DIAGNOSIS — I5042 Chronic combined systolic (congestive) and diastolic (congestive) heart failure: Secondary | ICD-10-CM | POA: Diagnosis not present

## 2019-07-20 DIAGNOSIS — N2581 Secondary hyperparathyroidism of renal origin: Secondary | ICD-10-CM | POA: Insufficient documentation

## 2019-07-20 DIAGNOSIS — Z7901 Long term (current) use of anticoagulants: Secondary | ICD-10-CM | POA: Diagnosis not present

## 2019-07-20 DIAGNOSIS — G9341 Metabolic encephalopathy: Secondary | ICD-10-CM | POA: Diagnosis not present

## 2019-07-20 DIAGNOSIS — I130 Hypertensive heart and chronic kidney disease with heart failure and stage 1 through stage 4 chronic kidney disease, or unspecified chronic kidney disease: Secondary | ICD-10-CM | POA: Diagnosis not present

## 2019-07-20 DIAGNOSIS — E1110 Type 2 diabetes mellitus with ketoacidosis without coma: Secondary | ICD-10-CM | POA: Diagnosis not present

## 2019-07-20 DIAGNOSIS — Z8673 Personal history of transient ischemic attack (TIA), and cerebral infarction without residual deficits: Secondary | ICD-10-CM | POA: Diagnosis not present

## 2019-07-20 DIAGNOSIS — Z5181 Encounter for therapeutic drug level monitoring: Secondary | ICD-10-CM

## 2019-07-20 NOTE — Progress Notes (Signed)
This encounter was created in error - please disregard.

## 2019-07-21 DIAGNOSIS — Z992 Dependence on renal dialysis: Secondary | ICD-10-CM | POA: Diagnosis not present

## 2019-07-21 DIAGNOSIS — E1129 Type 2 diabetes mellitus with other diabetic kidney complication: Secondary | ICD-10-CM | POA: Diagnosis not present

## 2019-07-21 DIAGNOSIS — N186 End stage renal disease: Secondary | ICD-10-CM | POA: Diagnosis not present

## 2019-07-21 DIAGNOSIS — N2581 Secondary hyperparathyroidism of renal origin: Secondary | ICD-10-CM | POA: Diagnosis not present

## 2019-07-21 DIAGNOSIS — D509 Iron deficiency anemia, unspecified: Secondary | ICD-10-CM | POA: Diagnosis not present

## 2019-07-21 DIAGNOSIS — D631 Anemia in chronic kidney disease: Secondary | ICD-10-CM | POA: Diagnosis not present

## 2019-07-24 DIAGNOSIS — N186 End stage renal disease: Secondary | ICD-10-CM | POA: Diagnosis not present

## 2019-07-24 DIAGNOSIS — D509 Iron deficiency anemia, unspecified: Secondary | ICD-10-CM | POA: Diagnosis not present

## 2019-07-24 DIAGNOSIS — D631 Anemia in chronic kidney disease: Secondary | ICD-10-CM | POA: Diagnosis not present

## 2019-07-24 DIAGNOSIS — N2581 Secondary hyperparathyroidism of renal origin: Secondary | ICD-10-CM | POA: Diagnosis not present

## 2019-07-24 DIAGNOSIS — Z992 Dependence on renal dialysis: Secondary | ICD-10-CM | POA: Diagnosis not present

## 2019-07-24 DIAGNOSIS — E1129 Type 2 diabetes mellitus with other diabetic kidney complication: Secondary | ICD-10-CM | POA: Diagnosis not present

## 2019-07-25 ENCOUNTER — Ambulatory Visit (INDEPENDENT_AMBULATORY_CARE_PROVIDER_SITE_OTHER): Payer: Medicare Other | Admitting: Pharmacist Clinician (PhC)/ Clinical Pharmacy Specialist

## 2019-07-25 ENCOUNTER — Ambulatory Visit (INDEPENDENT_AMBULATORY_CARE_PROVIDER_SITE_OTHER): Payer: Medicare Other | Admitting: Cardiovascular Disease

## 2019-07-25 ENCOUNTER — Other Ambulatory Visit: Payer: Self-pay

## 2019-07-25 ENCOUNTER — Encounter: Payer: Self-pay | Admitting: Cardiovascular Disease

## 2019-07-25 VITALS — BP 170/78 | HR 59 | Temp 97.1°F | Ht 66.0 in | Wt 157.0 lb

## 2019-07-25 DIAGNOSIS — I480 Paroxysmal atrial fibrillation: Secondary | ICD-10-CM

## 2019-07-25 DIAGNOSIS — Z5181 Encounter for therapeutic drug level monitoring: Secondary | ICD-10-CM

## 2019-07-25 DIAGNOSIS — N186 End stage renal disease: Secondary | ICD-10-CM

## 2019-07-25 DIAGNOSIS — I2510 Atherosclerotic heart disease of native coronary artery without angina pectoris: Secondary | ICD-10-CM

## 2019-07-25 DIAGNOSIS — I5043 Acute on chronic combined systolic (congestive) and diastolic (congestive) heart failure: Secondary | ICD-10-CM

## 2019-07-25 LAB — POCT INR: INR: 2.7 (ref 2.0–3.0)

## 2019-07-25 MED ORDER — HYDRALAZINE HCL 50 MG PO TABS: 50.0000 mg | ORAL_TABLET | Freq: Two times a day (BID) | ORAL | 1 refills | Status: DC

## 2019-07-25 NOTE — Progress Notes (Signed)
Cardiology Office Note   Date:  07/25/2019   ID:  Amber Young, DOB 1956/04/25, MRN [id number]  PCP:  Amber Glatter, FNP  Cardiologist:   Skeet Latch, MD   No chief complaint on file.    History of Present Illness: Amber Young is a 63 y.o. female with CAD status post LAD PCI in 2017, RCA 2019, paroxysmal atrial fibrillation, chronic systolic and diastolic heart failure (LVEF 25-30%), hypertension, hyperlipidemia, prior stroke, and ESRD on HD here for follow up.  She initially established care 04/2019.  She was previously a patient of Dr. Bronson Young but moved to [place] from [place].  She was admitted to the hospital 01/2019 with acute heart failure exacerbation.  BNP was elevated to 2666 in the setting of hypertensive urgency due to medication noncompliance.  She had an echo at that time that revealed LVEF 25 to 30% which was reduced from 40 to 45% previously.  She was not started on an ARB/ARNI due to her chronic kidney disease.  Cardiac catheterization was not pursued due to her renal dysfunction and lack of chest pain.  At her first telemedicine visit se was volume overloaded and lasix and metolazone were increased.  She saw Amber Memos, NP later that week in the office and her edema and orthopnea were improving.  Lasix was reduced back to 80mg  bid with metolazone q3 days.  However her symptoms recurred 06/2019.  She was seen in the office 06/29/19 and was markedly volume overloaded despite increasing lasix and metolazone.  She was direct admitted to the hospital for diuresis.  During that hospitalization she was started on HD and had a fistula placed in the L upper extremity.  Since discharge Amber Young has been well. She goes to HD MWF and tolerates a full session.  She is unsure of what her BP has been running but doesn't think it has been low.  Amlodipine and hydralazine were both discontinued by her nephrologist.  She continues to have some LE edema that is  improving with each session.  She denies chest pain and her breathing has been stable.  She denies palpitations, lightheadedness or dizziness.    Past Medical History:  Diagnosis Date  . Anemia   . Atrial fibrillation ([place]) 06/17/2019  . Chronic combined systolic and diastolic CHF (congestive heart failure) ([place])    a. EF 40-45% in 2019 b. EF 25-30% by repeat echo in 01/2019  . CKD (chronic kidney disease), stage IV ([place])   . Coronary artery disease 08/2017   DES x 2 RCA  . DKA (diabetic ketoacidoses) ([place]) 06/16/2019  . GERD (gastroesophageal reflux disease)   . Heart murmur   . Hyperlipidemia   . Hypertension   . Proliferative diabetic retinopathy ([place])    left eye with vitreous hemorrhage and tractional retinal detachment  . Stroke ([place]) 09/2014   numbness left upper lip, finger tips on left hand; "resolved" (05/18/2016)  . Type II diabetes mellitus ([place])   . Wears glasses     Past Surgical History:  Procedure Laterality Date  . AV FISTULA PLACEMENT Left 07/10/2019   Procedure: ARTERIOVENOUS (AV) FISTULA CREATION;  Surgeon: Angelia Mould, MD;  Location: [facility];  Service: Vascular;  Laterality: Left;  . CARDIAC CATHETERIZATION N/A 05/18/2016   Procedure: Left Heart Cath and Coronary Angiography;  Surgeon: Jettie Booze, MD;  Location: [facility];  Service: Cardiovascular;  Laterality: N/A;  . CARDIAC CATHETERIZATION N/A 05/18/2016   Procedure: Coronary Stent Intervention;  Surgeon: Jettie Booze, MD;  Location: [facility];  Service: Cardiovascular;  Laterality: N/A;  . CATARACT EXTRACTION Right 2020  . COLONOSCOPY W/ BIOPSIES AND POLYPECTOMY    . CORONARY ANGIOPLASTY    . CORONARY ANGIOPLASTY WITH STENT PLACEMENT    . CORONARY STENT INTERVENTION N/A 09/07/2017   Procedure: CORONARY STENT INTERVENTION;  Surgeon: Leonie Man, MD;  Location: [facility];  Service: Cardiovascular;  Laterality: N/A;  . EYE SURGERY    . GAS INSERTION Left  01/13/2018   Procedure: INSERTION OF GAS;  Surgeon: Bernarda Caffey, MD;  Location: [facility];  Service: Ophthalmology;  Laterality: Left;  . INSERTION OF DIALYSIS CATHETER Right 07/10/2019   Procedure: INSERTION OF TUNNELED DIALYSIS CATHETER;  Surgeon: Angelia Mould, MD;  Location: [facility];  Service: Vascular;  Laterality: Right;  . IR FLUORO GUIDE CV LINE RIGHT  07/05/2019  . IR US GUIDE VASC ACCESS RIGHT  07/05/2019  . LEFT HEART CATH AND CORONARY ANGIOGRAPHY N/A 09/07/2017   Procedure: LEFT HEART CATH AND CORONARY ANGIOGRAPHY;  Surgeon: Leonie Man, MD;  Location: [facility];  Service: Cardiovascular;  Laterality: N/A;  . MEMBRANE PEEL Left 01/13/2018   Procedure: MEMBRANE PEEL LEFT EYE ;  Surgeon: Bernarda Caffey, MD;  Location: [facility];  Service: Ophthalmology;  Laterality: Left;  Marland Kitchen MULTIPLE TOOTH EXTRACTIONS    . PARS PLANA VITRECTOMY Left 01/13/2018   Procedure: PARS PLANA VITRECTOMY WITH 25 GAUGE LEFT EYE WITH ENDOLASER;  Surgeon: Bernarda Caffey, MD;  Location: [facility];  Service: Ophthalmology;  Laterality: Left;  . TUBAL LIGATION  1980  . VAGINAL HYSTERECTOMY  1999   "partial; fibroids"     Current Outpatient Medications  Medication Sig Dispense Refill  . acetaminophen (TYLENOL) 500 MG tablet Take 2 tablets (1,000 mg total) by mouth every 6 (six) hours as needed for mild pain or headache. 30 tablet 11  . ALPRAZolam (XANAX) 0.25 MG tablet Take 1 tablet (0.25 mg total) by mouth 2 (two) times daily as needed for anxiety. 20 tablet 0  . atorvastatin (LIPITOR) 40 MG tablet Take 1 tablet (40 mg total) by mouth daily at 6 PM. 90 tablet 1  . brimonidine (ALPHAGAN) 0.2 % ophthalmic solution Place 1 drop into the left eye 2 (two) times daily.    . carvedilol (COREG) 25 MG tablet Take 1 tablet (25 mg total) by mouth 2 (two) times daily. 180 tablet 1  . dorzolamide-timolol (COSOPT) 22.3-6.8 MG/ML ophthalmic solution Place 1 drop into the left eye 2 (two) times daily.    . feeding supplement,  ENSURE ENLIVE, (ENSURE ENLIVE) LIQD Take 237 mLs by mouth 2 (two) times daily between meals. 237 mL 12  . ferrous sulfate (FERROUSUL) 325 (65 FE) MG tablet Take 1 tablet (325 mg total) by mouth every other day. 90 tablet 1  . glipiZIDE (GLUCOTROL) 10 MG tablet Take 1 tablet (10 mg total) by mouth 2 (two) times daily before a meal. 180 tablet 1  . Insulin Glargine (LANTUS) 100 UNIT/ML Solostar Pen Inject 4 Units into the skin daily. 9 mL 1  . Insulin Pen Needle (TRUEPLUS PEN NEEDLES) 31G X 6 MM MISC USE AS DIRECTED 300 each 1  . isosorbide mononitrate (IMDUR) 30 MG 24 hr tablet Take 1 tablet (30 mg total) by mouth daily. 30 tablet 0  . ketorolac (ACULAR) 0.4 % SOLN Place 1 drop into the right eye 4 (four) times daily.    . multivitamin (RENA-VIT) TABS tablet  Take 1 tablet by mouth at bedtime. 30 tablet 0  . nitroGLYCERIN (NITROSTAT) 0.4 MG SL tablet Place 1 tablet (0.4 mg total) under the tongue every 5 (five) minutes x 3 doses as needed for chest pain. 90 tablet 1  . ofloxacin (OCUFLOX) 0.3 % ophthalmic solution Place 1 drop into the right eye 3 (three) times daily.    . ticagrelor (BRILINTA) 90 MG TABS tablet Take 1 tablet (90 mg total) by mouth 2 (two) times daily. 180 tablet 1  . warfarin (COUMADIN) 5 MG tablet Take 5mg  daily except for 7.5mg  every Monday, Wednesday and Friday (Patient taking differently: Take 5-7.5 mg by mouth See admin instructions. Take 5mg  daily except for 7.5mg  every Monday, Wednesday and Friday) 90 tablet 1   No current facility-administered medications for this visit.    Allergies:   Ace inhibitors    Social History:  The patient  reports that she has never smoked. She has never used smokeless tobacco. She reports that she does not drink alcohol or use drugs.   Family History:  The patient's family history includes COPD in her mother; Diabetes in her mother; Heart failure in her mother; Hypertension in her brother, mother, and sister.    ROS:  Please see the history  of present illness.   Otherwise, review of systems are positive for none.   All other systems are reviewed and negative.    PHYSICAL EXAM: VS:  BP (!) 170/78   Pulse (!) 59   Temp (!) 97.1 F (36.2 C)   Ht 5\' 6"  (1.676 m)   Wt 157 lb (71.2 kg)   SpO2 100%   BMI 25.34 kg/m  , BMI Body mass index is 25.34 kg/m. GENERAL:  Chronically ill-appearing HEENT: Pupils equal round and reactive, fundi not visualized, oral mucosa unremarkable NECK:  No jugular venous distention, waveform within normal limits, carotid upstroke brisk and symmetric, no bruits LUNGS:  Clear to auscultation bilaterally HEART:  RRR.  PMI not displaced or sustained,S1 and S2 within normal limits, no S3, no S4, no clicks, no rubs, no murmurs ABD:  Flat, positive bowel sounds normal in frequency in pitch, no bruits, no rebound, no guarding, no midline pulsatile mass, no hepatomegaly, no splenomegaly EXT:  2 plus pulses throughout, 1+ LE edema to upper tibia bilaterally, no cyanosis no clubbing SKIN:  No rashes no nodules NEURO:  Cranial nerves II through XII grossly intact, motor grossly intact throughout PSYCH:  Cognitively intact, oriented to person place and time   EKG:  EKG is not ordered today.   Recent Labs: 06/29/2019: ALT 31; B Natriuretic Peptide 2,340.4; TSH 1.162 07/10/2019: Magnesium 2.1 07/13/2019: BUN 38; Creatinine, Ser 4.59; Hemoglobin 8.4; Platelets 309; Potassium 3.9; Sodium 131    Lipid Panel    Component Value Date/Time   CHOL 217 (H) 01/28/2019 0536   TRIG 59 01/28/2019 0536   HDL 75 01/28/2019 0536   CHOLHDL 2.9 01/28/2019 0536   VLDL 12 01/28/2019 0536   LDLCALC 130 (H) 01/28/2019 0536      Wt Readings from Last 3 Encounters:  07/25/19 157 lb (71.2 kg)  07/13/19 164 lb 10.9 oz (74.7 kg)  06/29/19 183 lb 6.4 oz (83.2 kg)      ASSESSMENT AND PLAN:  # Chronic systolic and diastolic heart failure: # Essential hypertension: LVEF reduced to 20-25% from 40-45% 07/2018.  She has  ischemic cardiomyopathy and a history of poorly controlled BP.  She hasn't been on guideline directed therapy 2/2 CKD IV.  Se is now on HD and may be able to start an ARB if OK with her nephrologist.  She is unsure of their name but will call us with this information.  Continue carvedilol and Imdur.  BP poorly controlled initially and on repeat.  Resume hydralazine at 50mg  bid.  Unclear why it was stopped.  We will continue to hold amlodipine given her systolic dysfunction.  # CAD: She had two DESs placed in the RCA 08/2017 and one in the LAD in 2017.  No ischemic symptoms. Continue carvedilol, Isordil, and atorvastatin.  She has shortness of breath that may be partially due to ticagrelor.  Will reassess once she is euvolemic.  # ESRD: Now on HD MWF.  # PAF: Continue carvedilol and warfarin.  Currently in sinus rhythm.    Current medicines are reviewed at length with the patient today.  The patient does not have concerns regarding medicines.  The following changes have been made:  none  Labs/ tests ordered today include:  No orders of the defined types were placed in this encounter.    Disposition:   FU with Braley Luckenbaugh C. Oval Linsey, MD, [facility] in 2 months    Signed, Ermagene Saidi C. Oval Linsey, MD, [facility]  07/25/2019 11:32 AM    [facility]

## 2019-07-25 NOTE — Telephone Encounter (Signed)
Patient seen in office today. 

## 2019-07-25 NOTE — Telephone Encounter (Signed)
Over the past few days multiple call have been placed to [facility] [phone] for reply. No reply. Message left on Mrs. Diemer's voice mail to call back with any concerns.

## 2019-07-25 NOTE — Patient Instructions (Signed)
Medication Instructions:  START HYDRAZINE 50 MG TWICE A DAY   *If you need a refill on your cardiac medications before your next appointment, please call your pharmacy*  Lab Work: NONE   Testing/Procedures: NONE  Follow-Up: At [facility], you and your health needs are our priority.  As part of our continuing mission to provide you with exceptional heart care, we have created designated Provider Care Teams.  These Care Teams include your primary Cardiologist (physician) and Advanced Practice Providers (APPs -  Physician Assistants and Nurse Practitioners) who all work together to provide you with the care you need, when you need it.  Your next appointment:   2 month(s)  The format for your next appointment:   IN PERSON OR VIRTUAL   Provider:   You may see Skeet Latch, MD or one of the following Advanced Practice Providers on your designated Care Team:    Kerin Ransom, PA-C  [place], [place]  Coletta Memos, [place]   Other Instructions CALL THE OFFICE WITH THE NAME OF KIDNEY DOCTOR

## 2019-07-26 DIAGNOSIS — E1110 Type 2 diabetes mellitus with ketoacidosis without coma: Secondary | ICD-10-CM | POA: Diagnosis not present

## 2019-07-26 DIAGNOSIS — E1122 Type 2 diabetes mellitus with diabetic chronic kidney disease: Secondary | ICD-10-CM | POA: Diagnosis not present

## 2019-07-26 DIAGNOSIS — D631 Anemia in chronic kidney disease: Secondary | ICD-10-CM | POA: Diagnosis not present

## 2019-07-26 DIAGNOSIS — Z992 Dependence on renal dialysis: Secondary | ICD-10-CM | POA: Diagnosis not present

## 2019-07-26 DIAGNOSIS — N184 Chronic kidney disease, stage 4 (severe): Secondary | ICD-10-CM | POA: Diagnosis not present

## 2019-07-26 DIAGNOSIS — D509 Iron deficiency anemia, unspecified: Secondary | ICD-10-CM | POA: Diagnosis not present

## 2019-07-26 DIAGNOSIS — I5042 Chronic combined systolic (congestive) and diastolic (congestive) heart failure: Secondary | ICD-10-CM | POA: Diagnosis not present

## 2019-07-26 DIAGNOSIS — E1129 Type 2 diabetes mellitus with other diabetic kidney complication: Secondary | ICD-10-CM | POA: Diagnosis not present

## 2019-07-26 DIAGNOSIS — N2581 Secondary hyperparathyroidism of renal origin: Secondary | ICD-10-CM | POA: Diagnosis not present

## 2019-07-26 DIAGNOSIS — I130 Hypertensive heart and chronic kidney disease with heart failure and stage 1 through stage 4 chronic kidney disease, or unspecified chronic kidney disease: Secondary | ICD-10-CM | POA: Diagnosis not present

## 2019-07-26 DIAGNOSIS — Z794 Long term (current) use of insulin: Secondary | ICD-10-CM | POA: Diagnosis not present

## 2019-07-26 DIAGNOSIS — N186 End stage renal disease: Secondary | ICD-10-CM | POA: Diagnosis not present

## 2019-07-27 DIAGNOSIS — E1110 Type 2 diabetes mellitus with ketoacidosis without coma: Secondary | ICD-10-CM | POA: Diagnosis not present

## 2019-07-27 DIAGNOSIS — Z794 Long term (current) use of insulin: Secondary | ICD-10-CM | POA: Diagnosis not present

## 2019-07-27 DIAGNOSIS — I130 Hypertensive heart and chronic kidney disease with heart failure and stage 1 through stage 4 chronic kidney disease, or unspecified chronic kidney disease: Secondary | ICD-10-CM | POA: Diagnosis not present

## 2019-07-27 DIAGNOSIS — E1122 Type 2 diabetes mellitus with diabetic chronic kidney disease: Secondary | ICD-10-CM | POA: Diagnosis not present

## 2019-07-27 DIAGNOSIS — I5042 Chronic combined systolic (congestive) and diastolic (congestive) heart failure: Secondary | ICD-10-CM | POA: Diagnosis not present

## 2019-07-27 DIAGNOSIS — N184 Chronic kidney disease, stage 4 (severe): Secondary | ICD-10-CM | POA: Diagnosis not present

## 2019-07-28 DIAGNOSIS — Z992 Dependence on renal dialysis: Secondary | ICD-10-CM | POA: Diagnosis not present

## 2019-07-28 DIAGNOSIS — D509 Iron deficiency anemia, unspecified: Secondary | ICD-10-CM | POA: Diagnosis not present

## 2019-07-28 DIAGNOSIS — E1129 Type 2 diabetes mellitus with other diabetic kidney complication: Secondary | ICD-10-CM | POA: Diagnosis not present

## 2019-07-28 DIAGNOSIS — N2581 Secondary hyperparathyroidism of renal origin: Secondary | ICD-10-CM | POA: Diagnosis not present

## 2019-07-28 DIAGNOSIS — N186 End stage renal disease: Secondary | ICD-10-CM | POA: Diagnosis not present

## 2019-07-28 DIAGNOSIS — D631 Anemia in chronic kidney disease: Secondary | ICD-10-CM | POA: Diagnosis not present

## 2019-07-31 DIAGNOSIS — Z992 Dependence on renal dialysis: Secondary | ICD-10-CM | POA: Diagnosis not present

## 2019-07-31 DIAGNOSIS — N186 End stage renal disease: Secondary | ICD-10-CM | POA: Diagnosis not present

## 2019-07-31 DIAGNOSIS — N2581 Secondary hyperparathyroidism of renal origin: Secondary | ICD-10-CM | POA: Diagnosis not present

## 2019-07-31 DIAGNOSIS — D631 Anemia in chronic kidney disease: Secondary | ICD-10-CM | POA: Diagnosis not present

## 2019-07-31 DIAGNOSIS — D509 Iron deficiency anemia, unspecified: Secondary | ICD-10-CM | POA: Diagnosis not present

## 2019-07-31 DIAGNOSIS — E1129 Type 2 diabetes mellitus with other diabetic kidney complication: Secondary | ICD-10-CM | POA: Diagnosis not present

## 2019-08-01 DIAGNOSIS — N184 Chronic kidney disease, stage 4 (severe): Secondary | ICD-10-CM | POA: Diagnosis not present

## 2019-08-01 DIAGNOSIS — Z794 Long term (current) use of insulin: Secondary | ICD-10-CM | POA: Diagnosis not present

## 2019-08-01 DIAGNOSIS — I5042 Chronic combined systolic (congestive) and diastolic (congestive) heart failure: Secondary | ICD-10-CM | POA: Diagnosis not present

## 2019-08-01 DIAGNOSIS — E1110 Type 2 diabetes mellitus with ketoacidosis without coma: Secondary | ICD-10-CM | POA: Diagnosis not present

## 2019-08-01 DIAGNOSIS — I130 Hypertensive heart and chronic kidney disease with heart failure and stage 1 through stage 4 chronic kidney disease, or unspecified chronic kidney disease: Secondary | ICD-10-CM | POA: Diagnosis not present

## 2019-08-01 DIAGNOSIS — E1122 Type 2 diabetes mellitus with diabetic chronic kidney disease: Secondary | ICD-10-CM | POA: Diagnosis not present

## 2019-08-02 ENCOUNTER — Telehealth: Payer: Self-pay

## 2019-08-02 DIAGNOSIS — E1110 Type 2 diabetes mellitus with ketoacidosis without coma: Secondary | ICD-10-CM | POA: Diagnosis not present

## 2019-08-02 DIAGNOSIS — N2581 Secondary hyperparathyroidism of renal origin: Secondary | ICD-10-CM | POA: Diagnosis not present

## 2019-08-02 DIAGNOSIS — I5042 Chronic combined systolic (congestive) and diastolic (congestive) heart failure: Secondary | ICD-10-CM | POA: Diagnosis not present

## 2019-08-02 DIAGNOSIS — D509 Iron deficiency anemia, unspecified: Secondary | ICD-10-CM | POA: Diagnosis not present

## 2019-08-02 DIAGNOSIS — D631 Anemia in chronic kidney disease: Secondary | ICD-10-CM | POA: Diagnosis not present

## 2019-08-02 DIAGNOSIS — N184 Chronic kidney disease, stage 4 (severe): Secondary | ICD-10-CM | POA: Diagnosis not present

## 2019-08-02 DIAGNOSIS — Z992 Dependence on renal dialysis: Secondary | ICD-10-CM | POA: Diagnosis not present

## 2019-08-02 DIAGNOSIS — E1129 Type 2 diabetes mellitus with other diabetic kidney complication: Secondary | ICD-10-CM | POA: Diagnosis not present

## 2019-08-02 DIAGNOSIS — N186 End stage renal disease: Secondary | ICD-10-CM | POA: Diagnosis not present

## 2019-08-02 DIAGNOSIS — E1122 Type 2 diabetes mellitus with diabetic chronic kidney disease: Secondary | ICD-10-CM | POA: Diagnosis not present

## 2019-08-02 DIAGNOSIS — I130 Hypertensive heart and chronic kidney disease with heart failure and stage 1 through stage 4 chronic kidney disease, or unspecified chronic kidney disease: Secondary | ICD-10-CM | POA: Diagnosis not present

## 2019-08-02 DIAGNOSIS — Z794 Long term (current) use of insulin: Secondary | ICD-10-CM | POA: Diagnosis not present

## 2019-08-02 NOTE — Telephone Encounter (Signed)
OT Called:  Amber Young's BP is 182/82, Weight was 153 lbs yesterday. Today it is 156.9 lbs. Call back ph# [phone].  He also reported she missed her dose last night. But took dose at 8:00 AM today.  Per Kathe Becton: Check BP after lunch and call back with results. Increase in BP probably due to missed dosing last night.

## 2019-08-05 DIAGNOSIS — N2581 Secondary hyperparathyroidism of renal origin: Secondary | ICD-10-CM | POA: Diagnosis not present

## 2019-08-05 DIAGNOSIS — N186 End stage renal disease: Secondary | ICD-10-CM | POA: Diagnosis not present

## 2019-08-05 DIAGNOSIS — E1129 Type 2 diabetes mellitus with other diabetic kidney complication: Secondary | ICD-10-CM | POA: Diagnosis not present

## 2019-08-05 DIAGNOSIS — D509 Iron deficiency anemia, unspecified: Secondary | ICD-10-CM | POA: Diagnosis not present

## 2019-08-05 DIAGNOSIS — Z992 Dependence on renal dialysis: Secondary | ICD-10-CM | POA: Diagnosis not present

## 2019-08-05 DIAGNOSIS — D631 Anemia in chronic kidney disease: Secondary | ICD-10-CM | POA: Diagnosis not present

## 2019-08-06 ENCOUNTER — Telehealth: Payer: Self-pay | Admitting: Nurse Practitioner

## 2019-08-06 DIAGNOSIS — E1110 Type 2 diabetes mellitus with ketoacidosis without coma: Secondary | ICD-10-CM | POA: Diagnosis not present

## 2019-08-06 DIAGNOSIS — Z794 Long term (current) use of insulin: Secondary | ICD-10-CM | POA: Diagnosis not present

## 2019-08-06 DIAGNOSIS — E1122 Type 2 diabetes mellitus with diabetic chronic kidney disease: Secondary | ICD-10-CM | POA: Diagnosis not present

## 2019-08-06 DIAGNOSIS — I130 Hypertensive heart and chronic kidney disease with heart failure and stage 1 through stage 4 chronic kidney disease, or unspecified chronic kidney disease: Secondary | ICD-10-CM | POA: Diagnosis not present

## 2019-08-06 DIAGNOSIS — E1151 Type 2 diabetes mellitus with diabetic peripheral angiopathy without gangrene: Secondary | ICD-10-CM | POA: Insufficient documentation

## 2019-08-06 DIAGNOSIS — I5042 Chronic combined systolic (congestive) and diastolic (congestive) heart failure: Secondary | ICD-10-CM | POA: Diagnosis not present

## 2019-08-06 DIAGNOSIS — N184 Chronic kidney disease, stage 4 (severe): Secondary | ICD-10-CM | POA: Diagnosis not present

## 2019-08-06 NOTE — Telephone Encounter (Signed)
   I received a call from home health nurse related to Ms. Thaker's INR.  She apparently had an INR checked on Thursday that was 4.5.  Home health did not receive any instruction as to manage her Coumadin and Ms. Ellis has been taking her usual doses ever since.  Home health is not presently at her house.  I advised to have her hold her Coumadin with plan for repeat INR after dialysis tomorrow and then our pharmacists can provide further instruction.  Caller verbalized understanding and was grateful for the call back.  Murray Hodgkins, NP 08/06/2019, 2:35 PM

## 2019-08-07 ENCOUNTER — Telehealth: Payer: Self-pay

## 2019-08-07 DIAGNOSIS — N2581 Secondary hyperparathyroidism of renal origin: Secondary | ICD-10-CM | POA: Diagnosis not present

## 2019-08-07 DIAGNOSIS — E1129 Type 2 diabetes mellitus with other diabetic kidney complication: Secondary | ICD-10-CM | POA: Diagnosis not present

## 2019-08-07 DIAGNOSIS — D509 Iron deficiency anemia, unspecified: Secondary | ICD-10-CM | POA: Diagnosis not present

## 2019-08-07 DIAGNOSIS — D631 Anemia in chronic kidney disease: Secondary | ICD-10-CM | POA: Diagnosis not present

## 2019-08-07 DIAGNOSIS — Z992 Dependence on renal dialysis: Secondary | ICD-10-CM | POA: Diagnosis not present

## 2019-08-07 DIAGNOSIS — N186 End stage renal disease: Secondary | ICD-10-CM | POA: Diagnosis not present

## 2019-08-07 NOTE — Telephone Encounter (Signed)
Called [facility] to follow up on INR. They state she is scheduled to have INR checked tomorrow. Attempted to call patient to instruct her to hold coumadin again tonight. No answer-left voicemail to return call.

## 2019-08-07 NOTE — Telephone Encounter (Signed)
I called to check on Mrs. Amber Young as requested. Pre Mrs. & Mr Nolle she is doing okay today. Yesterday her blood pressure was high. However today its better. (Mr. Mullenax did not give any readings). They are advised to call back if needed.

## 2019-08-08 ENCOUNTER — Ambulatory Visit (INDEPENDENT_AMBULATORY_CARE_PROVIDER_SITE_OTHER): Payer: Medicare Other | Admitting: Pharmacist Clinician (PhC)/ Clinical Pharmacy Specialist

## 2019-08-08 DIAGNOSIS — I480 Paroxysmal atrial fibrillation: Secondary | ICD-10-CM | POA: Diagnosis not present

## 2019-08-08 DIAGNOSIS — I5042 Chronic combined systolic (congestive) and diastolic (congestive) heart failure: Secondary | ICD-10-CM | POA: Diagnosis not present

## 2019-08-08 DIAGNOSIS — N184 Chronic kidney disease, stage 4 (severe): Secondary | ICD-10-CM | POA: Diagnosis not present

## 2019-08-08 DIAGNOSIS — E1110 Type 2 diabetes mellitus with ketoacidosis without coma: Secondary | ICD-10-CM | POA: Diagnosis not present

## 2019-08-08 DIAGNOSIS — Z5181 Encounter for therapeutic drug level monitoring: Secondary | ICD-10-CM | POA: Diagnosis not present

## 2019-08-08 DIAGNOSIS — I130 Hypertensive heart and chronic kidney disease with heart failure and stage 1 through stage 4 chronic kidney disease, or unspecified chronic kidney disease: Secondary | ICD-10-CM | POA: Diagnosis not present

## 2019-08-08 DIAGNOSIS — E1122 Type 2 diabetes mellitus with diabetic chronic kidney disease: Secondary | ICD-10-CM | POA: Diagnosis not present

## 2019-08-08 DIAGNOSIS — Z794 Long term (current) use of insulin: Secondary | ICD-10-CM | POA: Diagnosis not present

## 2019-08-08 LAB — POCT INR: INR: 1.4 — AB (ref 2.0–3.0)

## 2019-08-09 ENCOUNTER — Other Ambulatory Visit: Payer: Self-pay | Admitting: Family Medicine

## 2019-08-09 DIAGNOSIS — D631 Anemia in chronic kidney disease: Secondary | ICD-10-CM | POA: Diagnosis not present

## 2019-08-09 DIAGNOSIS — N186 End stage renal disease: Secondary | ICD-10-CM | POA: Diagnosis not present

## 2019-08-09 DIAGNOSIS — D509 Iron deficiency anemia, unspecified: Secondary | ICD-10-CM | POA: Diagnosis not present

## 2019-08-09 DIAGNOSIS — E1129 Type 2 diabetes mellitus with other diabetic kidney complication: Secondary | ICD-10-CM | POA: Diagnosis not present

## 2019-08-09 DIAGNOSIS — N2581 Secondary hyperparathyroidism of renal origin: Secondary | ICD-10-CM | POA: Diagnosis not present

## 2019-08-09 DIAGNOSIS — Z992 Dependence on renal dialysis: Secondary | ICD-10-CM | POA: Diagnosis not present

## 2019-08-09 MED FILL — AMLODIPINE BESYLATE 10 MG T: 10 | 90 days supply | Qty: 90 | Fill #0 | Status: TO

## 2019-08-09 NOTE — Telephone Encounter (Signed)
This is not on current list and says it was stopped in 01/2019. Please advise if this refill should be authorized

## 2019-08-10 ENCOUNTER — Encounter: Payer: Self-pay | Admitting: Gastroenterology

## 2019-08-11 DIAGNOSIS — N186 End stage renal disease: Secondary | ICD-10-CM | POA: Diagnosis not present

## 2019-08-11 DIAGNOSIS — E1122 Type 2 diabetes mellitus with diabetic chronic kidney disease: Secondary | ICD-10-CM | POA: Diagnosis not present

## 2019-08-11 DIAGNOSIS — Z992 Dependence on renal dialysis: Secondary | ICD-10-CM | POA: Diagnosis not present

## 2019-08-12 DIAGNOSIS — E1129 Type 2 diabetes mellitus with other diabetic kidney complication: Secondary | ICD-10-CM | POA: Diagnosis not present

## 2019-08-12 DIAGNOSIS — N186 End stage renal disease: Secondary | ICD-10-CM | POA: Diagnosis not present

## 2019-08-12 DIAGNOSIS — Z992 Dependence on renal dialysis: Secondary | ICD-10-CM | POA: Diagnosis not present

## 2019-08-12 DIAGNOSIS — Z23 Encounter for immunization: Secondary | ICD-10-CM | POA: Diagnosis not present

## 2019-08-12 DIAGNOSIS — N2581 Secondary hyperparathyroidism of renal origin: Secondary | ICD-10-CM | POA: Diagnosis not present

## 2019-08-12 DIAGNOSIS — D509 Iron deficiency anemia, unspecified: Secondary | ICD-10-CM | POA: Diagnosis not present

## 2019-08-12 DIAGNOSIS — D631 Anemia in chronic kidney disease: Secondary | ICD-10-CM | POA: Diagnosis not present

## 2019-08-14 DIAGNOSIS — N186 End stage renal disease: Secondary | ICD-10-CM | POA: Diagnosis not present

## 2019-08-14 DIAGNOSIS — D509 Iron deficiency anemia, unspecified: Secondary | ICD-10-CM | POA: Diagnosis not present

## 2019-08-14 DIAGNOSIS — Z992 Dependence on renal dialysis: Secondary | ICD-10-CM | POA: Diagnosis not present

## 2019-08-14 DIAGNOSIS — N2581 Secondary hyperparathyroidism of renal origin: Secondary | ICD-10-CM | POA: Diagnosis not present

## 2019-08-14 DIAGNOSIS — E1129 Type 2 diabetes mellitus with other diabetic kidney complication: Secondary | ICD-10-CM | POA: Diagnosis not present

## 2019-08-14 DIAGNOSIS — D631 Anemia in chronic kidney disease: Secondary | ICD-10-CM | POA: Diagnosis not present

## 2019-08-15 ENCOUNTER — Other Ambulatory Visit: Payer: Self-pay

## 2019-08-15 DIAGNOSIS — E1122 Type 2 diabetes mellitus with diabetic chronic kidney disease: Secondary | ICD-10-CM | POA: Diagnosis not present

## 2019-08-15 DIAGNOSIS — N185 Chronic kidney disease, stage 5: Secondary | ICD-10-CM

## 2019-08-15 DIAGNOSIS — E1110 Type 2 diabetes mellitus with ketoacidosis without coma: Secondary | ICD-10-CM | POA: Diagnosis not present

## 2019-08-15 DIAGNOSIS — Z794 Long term (current) use of insulin: Secondary | ICD-10-CM | POA: Diagnosis not present

## 2019-08-15 DIAGNOSIS — I5042 Chronic combined systolic (congestive) and diastolic (congestive) heart failure: Secondary | ICD-10-CM | POA: Diagnosis not present

## 2019-08-15 DIAGNOSIS — I130 Hypertensive heart and chronic kidney disease with heart failure and stage 1 through stage 4 chronic kidney disease, or unspecified chronic kidney disease: Secondary | ICD-10-CM | POA: Diagnosis not present

## 2019-08-15 DIAGNOSIS — N184 Chronic kidney disease, stage 4 (severe): Secondary | ICD-10-CM | POA: Diagnosis not present

## 2019-08-16 ENCOUNTER — Ambulatory Visit (HOSPITAL_COMMUNITY)
Admission: RE | Admit: 2019-08-16 | Discharge: 2019-08-16 | Disposition: A | Discharge status: Home / Self Care | Payer: Medicare Other | Source: Ambulatory Visit | Attending: Vascular Surgery | Admitting: Vascular Surgery

## 2019-08-16 ENCOUNTER — Other Ambulatory Visit: Payer: Self-pay

## 2019-08-16 ENCOUNTER — Encounter: Payer: Self-pay | Admitting: Family Medicine

## 2019-08-16 ENCOUNTER — Ambulatory Visit (INDEPENDENT_AMBULATORY_CARE_PROVIDER_SITE_OTHER): Payer: Medicare Other | Admitting: Family Medicine

## 2019-08-16 ENCOUNTER — Ambulatory Visit (INDEPENDENT_AMBULATORY_CARE_PROVIDER_SITE_OTHER): Payer: Self-pay | Admitting: Physician Assistant

## 2019-08-16 VITALS — BP 185/67 | HR 67 | Temp 97.7°F | Resp 18 | Ht 66.0 in | Wt 158.0 lb

## 2019-08-16 VITALS — BP 158/50 | HR 77 | Temp 98.0°F | Ht 66.0 in | Wt 157.0 lb

## 2019-08-16 DIAGNOSIS — D631 Anemia in chronic kidney disease: Secondary | ICD-10-CM | POA: Diagnosis not present

## 2019-08-16 DIAGNOSIS — Z09 Encounter for follow-up examination after completed treatment for conditions other than malignant neoplasm: Secondary | ICD-10-CM

## 2019-08-16 DIAGNOSIS — E1159 Type 2 diabetes mellitus with other circulatory complications: Secondary | ICD-10-CM

## 2019-08-16 DIAGNOSIS — E1129 Type 2 diabetes mellitus with other diabetic kidney complication: Secondary | ICD-10-CM | POA: Diagnosis not present

## 2019-08-16 DIAGNOSIS — D509 Iron deficiency anemia, unspecified: Secondary | ICD-10-CM | POA: Diagnosis not present

## 2019-08-16 DIAGNOSIS — R42 Dizziness and giddiness: Secondary | ICD-10-CM

## 2019-08-16 DIAGNOSIS — N185 Chronic kidney disease, stage 5: Secondary | ICD-10-CM | POA: Diagnosis not present

## 2019-08-16 DIAGNOSIS — I509 Heart failure, unspecified: Secondary | ICD-10-CM

## 2019-08-16 DIAGNOSIS — Z992 Dependence on renal dialysis: Secondary | ICD-10-CM

## 2019-08-16 DIAGNOSIS — I10 Essential (primary) hypertension: Secondary | ICD-10-CM

## 2019-08-16 DIAGNOSIS — N184 Chronic kidney disease, stage 4 (severe): Secondary | ICD-10-CM

## 2019-08-16 DIAGNOSIS — N2581 Secondary hyperparathyroidism of renal origin: Secondary | ICD-10-CM | POA: Diagnosis not present

## 2019-08-16 DIAGNOSIS — N186 End stage renal disease: Secondary | ICD-10-CM | POA: Diagnosis not present

## 2019-08-16 DIAGNOSIS — H547 Unspecified visual loss: Secondary | ICD-10-CM

## 2019-08-16 DIAGNOSIS — H409 Unspecified glaucoma: Secondary | ICD-10-CM

## 2019-08-16 DIAGNOSIS — H269 Unspecified cataract: Secondary | ICD-10-CM

## 2019-08-16 NOTE — Progress Notes (Signed)
Patient [facility] Follow Up  Subjective:  Patient ID: Amber Young, female    DOB: 11/01/1955  Age: 64 y.o. MRN: [id number]  CC:  Chief Complaint  Patient presents with  . Follow-up    HTN    HPI Amber Young is a 64 year old female presents for Hospital Follow Up today.   Past Medical History:  Diagnosis Date  . Anemia   . Atrial fibrillation ([place]) 06/17/2019  . Chronic combined systolic and diastolic CHF (congestive heart failure) ([place])    a. EF 40-45% in 2019 b. EF 25-30% by repeat echo in 01/2019  . CKD (chronic kidney disease), stage IV ([place])   . Coronary artery disease 08/2017   DES x 2 RCA  . Dialysis patient ([place]) 07/2019  . DKA (diabetic ketoacidoses) ([place]) 06/16/2019  . GERD (gastroesophageal reflux disease)   . Heart murmur   . Hyperlipidemia   . Hypertension   . Proliferative diabetic retinopathy ([place])    left eye with vitreous hemorrhage and tractional retinal detachment  . Stroke ([place]) 09/2014   numbness left upper lip, finger tips on left hand; "resolved" (05/18/2016)  . Type II diabetes mellitus ([place])   . Wears glasses    Current Status: Since her last office visit, she has had a Hospital Admission from 06/29/2019-07/13/2019 for CKD. Today, she is doing fairly well with no complaints. She is accompanied today by her daughter. She is currently receiving dialysis treatments 3 times a week. She has c/o right shoulder pain, since she received recent injections. She continues to follow up with Cardiologist, with monthly appointments. She arrives today via rolling walker. Her most recent normal range of preprandial blood glucose levels have been between 100-115. She has seen low range of 58, which she ate a snack to increase blood glucose and high of 250 since her last office visit. 25 denies fatigue, frequent urination, blurred vision, excessive hunger, excessive thirst, weight gain, weight loss, and poor  wound healing. She continues to check her feet regularly. She denies visual changes, chest pain, cough, shortness of breath, heart palpitations, and falls. She has occasional headaches and dizziness with position changes. Denies severe headaches, confusion, seizures, double vision, and blurred vision, nausea and vomiting. She denies fevers, chills, fatigue, recent infections, weight loss, and night sweats. No reports of GI problems such as diarrhea, and constipation. She has no reports of blood in stools, dysuria and hematuria. Mild anxiety r/t her health condition. She denies suicidal ideations, homicidal ideations, or auditory hallucinations. She denies pain today.   Past Surgical History:  Procedure Laterality Date  . AV FISTULA PLACEMENT Left 07/10/2019   Procedure: ARTERIOVENOUS (AV) FISTULA CREATION;  Surgeon: Angelia Mould, MD;  Location: [facility];  Service: Vascular;  Laterality: Left;  . CARDIAC CATHETERIZATION N/A 05/18/2016   Procedure: Left Heart Cath and Coronary Angiography;  Surgeon: Jettie Booze, MD;  Location: [place];  Service: Cardiovascular;  Laterality: N/A;  . CARDIAC CATHETERIZATION N/A 05/18/2016   Procedure: Coronary Stent Intervention;  Surgeon: Jettie Booze, MD;  Location: [place];  Service: Cardiovascular;  Laterality: N/A;  . CATARACT EXTRACTION Right 2020  . COLONOSCOPY W/ BIOPSIES AND POLYPECTOMY    . CORONARY ANGIOPLASTY    . CORONARY ANGIOPLASTY WITH STENT PLACEMENT    . CORONARY STENT INTERVENTION N/A 09/07/2017   Procedure: CORONARY STENT INTERVENTION;  Surgeon: Leonie Man, MD;  Location: [place];  Service: Cardiovascular;  Laterality: N/A;  . EYE SURGERY    . GAS INSERTION Left 01/13/2018   Procedure: INSERTION OF GAS;  Surgeon: Bernarda Caffey, MD;  Location: [place] [facility];  Service: Ophthalmology;  Laterality: Left;  . INSERTION OF DIALYSIS CATHETER Right 07/10/2019   Procedure: INSERTION OF TUNNELED DIALYSIS  CATHETER;  Surgeon: Angelia Mould, MD;  Location: [facility];  Service: Vascular;  Laterality: Right;  . IR FLUORO GUIDE CV LINE RIGHT  07/05/2019  . IR US GUIDE VASC ACCESS RIGHT  07/05/2019  . LEFT HEART CATH AND CORONARY ANGIOGRAPHY N/A 09/07/2017   Procedure: LEFT HEART CATH AND CORONARY ANGIOGRAPHY;  Surgeon: Leonie Man, MD;  Location: [facility];  Service: Cardiovascular;  Laterality: N/A;  . MEMBRANE PEEL Left 01/13/2018   Procedure: MEMBRANE PEEL LEFT EYE ;  Surgeon: Bernarda Caffey, MD;  Location: [facility];  Service: Ophthalmology;  Laterality: Left;  Marland Kitchen MULTIPLE TOOTH EXTRACTIONS    . PARS PLANA VITRECTOMY Left 01/13/2018   Procedure: PARS PLANA VITRECTOMY WITH 25 GAUGE LEFT EYE WITH ENDOLASER;  Surgeon: Bernarda Caffey, MD;  Location: [place];  Service: Ophthalmology;  Laterality: Left;  . TUBAL LIGATION  1980  . VAGINAL HYSTERECTOMY  1999   "partial; fibroids"    Family History  Problem Relation Age of Onset  . Diabetes Mother   . Hypertension Mother   . COPD Mother   . Heart failure Mother   . Hypertension Sister   . Hypertension Brother     Social History   Socioeconomic History  . Marital status: Married    Spouse name: Not on file  . Number of children: Not on file  . Years of education: 30  . Highest education level: Not on file  Occupational History  . Occupation: hairdresser  Tobacco Use  . Smoking status: Never Smoker  . Smokeless tobacco: Never Used  Substance and Sexual Activity  . Alcohol use: No    Alcohol/week: 0.0 standard drinks  . Drug use: No  . Sexual activity: Yes  Other Topics Concern  . Not on file  Social History Narrative   Married, 2 children, hairdresser   Caffeine use- occasional tea or coffee   Right handed   Social Determinants of Health   Financial Resource Strain:   . Difficulty of Paying Living Expenses: Not on file  Food Insecurity:   . Worried About Charity fundraiser in the Last Year: Not on file  . Ran Out of  Food in the Last Year: Not on file  Transportation Needs:   . Lack of Transportation (Medical): Not on file  . Lack of Transportation (Non-Medical): Not on file  Physical Activity:   . Days of Exercise per Week: Not on file  . Minutes of Exercise per Session: Not on file  Stress:   . Feeling of Stress : Not on file  Social Connections:   . Frequency of Communication with Friends and Family: Not on file  . Frequency of Social Gatherings with Friends and Family: Not on file  . Attends Religious Services: Not on file  . Active Member of Clubs or Organizations: Not on file  . Attends Archivist Meetings: Not on file  . Marital Status: Not on file  Intimate Partner Violence:   . Fear of Current or Ex-Partner: Not on file  . Emotionally Abused: Not on file  . Physically Abused: Not on file  . Sexually Abused: Not on file  Outpatient Medications Prior to Visit  Medication Sig Dispense Refill  . acetaminophen (TYLENOL) 500 MG tablet Take 2 tablets (1,000 mg total) by mouth every 6 (six) hours as needed for mild pain or headache. 30 tablet 11  . amLODipine (NORVASC) 10 MG tablet TAKE 1 TABLET (10 MG TOTAL) BY MOUTH DAILY. 90 tablet 2  . atorvastatin (LIPITOR) 40 MG tablet Take 1 tablet (40 mg total) by mouth daily at 6 PM. 90 tablet 1  . brimonidine (ALPHAGAN) 0.2 % ophthalmic solution Place 1 drop into the left eye 2 (two) times daily.    . carvedilol (COREG) 25 MG tablet Take 1 tablet (25 mg total) by mouth 2 (two) times daily. 180 tablet 1  . dorzolamide-timolol (COSOPT) 22.3-6.8 MG/ML ophthalmic solution Place 1 drop into the left eye 2 (two) times daily.    . feeding supplement, ENSURE ENLIVE, (ENSURE ENLIVE) LIQD Take 237 mLs by mouth 2 (two) times daily between meals. 237 mL 12  . ferrous sulfate (FERROUSUL) 325 (65 FE) MG tablet Take 1 tablet (325 mg total) by mouth every other day. 90 tablet 1  . glipiZIDE (GLUCOTROL) 10 MG tablet Take 1 tablet (10 mg total) by mouth 2  (two) times daily before a meal. 180 tablet 1  . hydrALAZINE (APRESOLINE) 50 MG tablet Take 1 tablet (50 mg total) by mouth 2 (two) times daily. 180 tablet 1  . Insulin Glargine (LANTUS) 100 UNIT/ML Solostar Pen Inject 4 Units into the skin daily. 9 mL 1  . Insulin Pen Needle (TRUEPLUS PEN NEEDLES) 31G X 6 MM MISC USE AS DIRECTED 300 each 1  . ketorolac (ACULAR) 0.4 % SOLN Place 1 drop into the right eye 4 (four) times daily.    . multivitamin (RENA-VIT) TABS tablet Take 1 tablet by mouth at bedtime. 30 tablet 0  . nitroGLYCERIN (NITROSTAT) 0.4 MG SL tablet Place 1 tablet (0.4 mg total) under the tongue every 5 (five) minutes x 3 doses as needed for chest pain. 90 tablet 1  . ofloxacin (OCUFLOX) 0.3 % ophthalmic solution Place 1 drop into the right eye 3 (three) times daily.    . ticagrelor (BRILINTA) 90 MG TABS tablet Take 1 tablet (90 mg total) by mouth 2 (two) times daily. 180 tablet 1  . warfarin (COUMADIN) 5 MG tablet Take 5mg  daily except for 7.5mg  every Monday, Wednesday and Friday (Patient taking differently: Take 5-7.5 mg by mouth See admin instructions. Take 5mg  daily except for 7.5mg  every Monday, Wednesday and Friday) 90 tablet 1  . ALPRAZolam (XANAX) 0.25 MG tablet Take 1 tablet (0.25 mg total) by mouth 2 (two) times daily as needed for anxiety. (Patient not taking: Reported on 08/16/2019) 20 tablet 0  . isosorbide mononitrate (IMDUR) 30 MG 24 hr tablet Take 1 tablet (30 mg total) by mouth daily. (Patient not taking: Reported on 08/16/2019) 30 tablet 0   No facility-administered medications prior to visit.    Allergies  Allergen Reactions  . Ace Inhibitors Cough    ROS Review of Systems  Constitutional: Negative.        Walker   HENT: Negative.   Eyes: Negative.   Respiratory: Negative.   Cardiovascular: Negative.   Gastrointestinal: Negative.   Endocrine: Negative.   Genitourinary: Negative.   Musculoskeletal: Negative.   Skin: Negative.   Allergic/Immunologic: Negative.     Neurological: Positive for dizziness (occasional), weakness (generalized.) and headaches (occasional ).  Hematological: Negative.   Psychiatric/Behavioral: Negative.       Objective:  Physical Exam  Constitutional: She is oriented to person, place, and time. She appears well-developed and well-nourished.  HENT:  Head: Normocephalic and atraumatic.  Eyes: Conjunctivae are normal.  Cardiovascular: Normal rate, regular rhythm, normal heart sounds and intact distal pulses.  Pulmonary/Chest: Effort normal and breath sounds normal.  Abdominal: Soft. Bowel sounds are normal.  Musculoskeletal:        General: Normal range of motion.     Cervical back: Normal range of motion and neck supple.  Neurological: She is alert and oriented to person, place, and time. She has normal reflexes.  Skin: Skin is warm and dry.  Dialysis catheter, upper right chest  Psychiatric: She has a normal mood and affect. Her behavior is normal. Judgment and thought content normal.  Nursing note and vitals reviewed.     BP (!) 158/50   Pulse 77   Temp 98 F (36.7 C) (Oral)   Ht 5\' 6"  (1.676 m)   Wt 157 lb (71.2 kg)   BMI 25.34 kg/m  Wt Readings from Last 3 Encounters:  08/16/19 158 lb (71.7 kg)  08/16/19 157 lb (71.2 kg)  07/25/19 157 lb (71.2 kg)     Health Maintenance Due  Topic Date Due  . Hepatitis C Screening  [birth year]  . PNEUMOCOCCAL POLYSACCHARIDE VACCINE AGE 81-64 HIGH RISK  04/09/1958  . OPHTHALMOLOGY EXAM  04/09/1966  . PAP SMEAR-Modifier  04/09/1977  . INFLUENZA VACCINE  03/11/2019  . FOOT EXAM  03/31/2019    There are no preventive care reminders to display for this patient.  Lab Results  Component Value Date   TSH 1.162 06/29/2019   Lab Results  Component Value Date   WBC 9.4 07/13/2019   HGB 8.4 (L) 07/13/2019   HCT 26.6 (L) 07/13/2019   MCV 78.9 (L) 07/13/2019   PLT 309 07/13/2019   Lab Results  Component Value Date   NA 131 (L) 07/13/2019   K 3.9 07/13/2019    CO2 26 07/13/2019   GLUCOSE 166 (H) 07/13/2019   BUN 38 (H) 07/13/2019   CREATININE 4.59 (H) 07/13/2019   BILITOT 0.6 06/29/2019   ALKPHOS 104 06/29/2019   AST 20 06/29/2019   ALT 31 06/29/2019   PROT 6.3 (L) 06/29/2019   ALBUMIN 3.0 (L) 07/13/2019   CALCIUM 8.7 (L) 07/13/2019   ANIONGAP 11 07/13/2019   Lab Results  Component Value Date   CHOL 217 (H) 01/28/2019   Lab Results  Component Value Date   HDL 75 01/28/2019   Lab Results  Component Value Date   LDLCALC 130 (H) 01/28/2019   Lab Results  Component Value Date   TRIG 59 01/28/2019   Lab Results  Component Value Date   CHOLHDL 2.9 01/28/2019   Lab Results  Component Value Date   HGBA1C 8.9 (H) 06/29/2019      Assessment & Plan:   1. Type 2 diabetes mellitus with other circulatory complication, without long-term current use of insulin (HCC) She will continue medication as prescribed, to decrease foods/beverages high in sugars and carbs and follow Heart Healthy or DASH diet. Increase physical activity to at least 30 minutes cardio exercise daily.   2. CKD (chronic kidney disease), stage IV ([place])  3. Hemodialysis patient [facility]) Continue dialysis treatments as prescribed.   4. Has received maintenance renal dialysis treatments [facility]) Completed treatment today.   5. Congestive heart failure, unspecified HF chronicity, unspecified heart failure type ([place]) Stable. No signs or symptoms of respiratory distress noted or reported.  6. Dizziness Stable today.   7. Essential hypertension She will continue to take medications as prescribed, to decrease high sodium intake, excessive alcohol intake, increase potassium intake, smoking cessation, and increase physical activity of at least 30 minutes of cardio activity daily. She will continue to follow Heart Healthy or DASH diet.  8. Visual problems  9. Cataract of both eyes, unspecified cataract type Continue eye drops as prescribed.   10. Glaucoma of left  eye  11. Follow up She will follow up in 3 months.   No orders of the defined types were placed in this encounter.   No orders of the defined types were placed in this encounter.   Referral Orders  No referral(s) requested today    Kathe Becton,  MSN, FNP-BC [address] [place], [place] [address] [phone] fax  Problem List Items Addressed This Visit      Cardiovascular and Mediastinum   Essential hypertension     Genitourinary   CKD (chronic kidney disease), stage IV ([place])     Other   Cataract of both eyes   Dizziness   Visual problems    Other Visit Diagnoses    Type 2 diabetes mellitus with other circulatory complication, without long-term current use of insulin ([place])    -  Primary   Hemodialysis patient ([place])       Has received maintenance renal dialysis treatments ([place])       Congestive heart failure, unspecified HF chronicity, unspecified heart failure type ([place])       Follow up          No orders of the defined types were placed in this encounter.   Follow-up: Return in about 3 months (around 11/14/2019).    Azzie Glatter, FNP

## 2019-08-16 NOTE — Progress Notes (Signed)
POST OPERATIVE OFFICE NOTE    CC:  F/u for surgery  HPI:  This is a 64 y.o. female who is s/p [facility] placement and left ulnar cephalic AV fistula creation for AKI on CKD while in the hospital on 07/10/19 by Dr. Scot Dock.   She is here today for f/u duplex and examination.    She denies pain, loss of motor or loss of sensation.     Allergies  Allergen Reactions  . Ace Inhibitors Cough    Current Outpatient Medications  Medication Sig Dispense Refill  . acetaminophen (TYLENOL) 500 MG tablet Take 2 tablets (1,000 mg total) by mouth every 6 (six) hours as needed for mild pain or headache. 30 tablet 11  . ALPRAZolam (XANAX) 0.25 MG tablet Take 1 tablet (0.25 mg total) by mouth 2 (two) times daily as needed for anxiety. 20 tablet 0  . amLODipine (NORVASC) 10 MG tablet TAKE 1 TABLET (10 MG TOTAL) BY MOUTH DAILY. 90 tablet 2  . atorvastatin (LIPITOR) 40 MG tablet Take 1 tablet (40 mg total) by mouth daily at 6 PM. 90 tablet 1  . brimonidine (ALPHAGAN) 0.2 % ophthalmic solution Place 1 drop into the left eye 2 (two) times daily.    . carvedilol (COREG) 25 MG tablet Take 1 tablet (25 mg total) by mouth 2 (two) times daily. 180 tablet 1  . dorzolamide-timolol (COSOPT) 22.3-6.8 MG/ML ophthalmic solution Place 1 drop into the left eye 2 (two) times daily.    . feeding supplement, ENSURE ENLIVE, (ENSURE ENLIVE) LIQD Take 237 mLs by mouth 2 (two) times daily between meals. 237 mL 12  . ferrous sulfate (FERROUSUL) 325 (65 FE) MG tablet Take 1 tablet (325 mg total) by mouth every other day. 90 tablet 1  . glipiZIDE (GLUCOTROL) 10 MG tablet Take 1 tablet (10 mg total) by mouth 2 (two) times daily before a meal. 180 tablet 1  . hydrALAZINE (APRESOLINE) 50 MG tablet Take 1 tablet (50 mg total) by mouth 2 (two) times daily. 180 tablet 1  . Insulin Glargine (LANTUS) 100 UNIT/ML Solostar Pen Inject 4 Units into the skin daily. 9 mL 1  . Insulin Pen Needle (TRUEPLUS PEN NEEDLES) 31G X 6 MM MISC USE AS DIRECTED 300  each 1  . isosorbide mononitrate (IMDUR) 30 MG 24 hr tablet Take 1 tablet (30 mg total) by mouth daily. 30 tablet 0  . ketorolac (ACULAR) 0.4 % SOLN Place 1 drop into the right eye 4 (four) times daily.    . multivitamin (RENA-VIT) TABS tablet Take 1 tablet by mouth at bedtime. 30 tablet 0  . nitroGLYCERIN (NITROSTAT) 0.4 MG SL tablet Place 1 tablet (0.4 mg total) under the tongue every 5 (five) minutes x 3 doses as needed for chest pain. 90 tablet 1  . ofloxacin (OCUFLOX) 0.3 % ophthalmic solution Place 1 drop into the right eye 3 (three) times daily.    . ticagrelor (BRILINTA) 90 MG TABS tablet Take 1 tablet (90 mg total) by mouth 2 (two) times daily. 180 tablet 1  . warfarin (COUMADIN) 5 MG tablet Take 5mg  daily except for 7.5mg  every Monday, Wednesday and Friday (Patient taking differently: Take 5-7.5 mg by mouth See admin instructions. Take 5mg  daily except for 7.5mg  every Monday, Wednesday and Friday) 90 tablet 1   No current facility-administered medications for this visit.     ROS:  See HPI  Physical Exam:  DIALYSIS ACCESS  Reason for Exam: Routine follow up.  Access Site: Left Upper Extremity.  History: Left ulnar cephalic AVF [id number].  Performing Technologist: Ronal Fear RVS, RCS    Examination Guidelines: A complete evaluation includes B-mode imaging, spectral Doppler, color Doppler, and power Doppler as needed of all accessible portions of each vessel. Unilateral testing is considered an integral part of a complete examination. Limited examinations for reoccurring indications may be performed as noted.    Findings: +--------------------+----------+-----------------+--------+ AVF                 PSV (cm/s)Flow Vol (mL/min)Comments +--------------------+----------+-----------------+--------+ Native artery inflow   259           621                +--------------------+----------+-----------------+--------+ AVF Anastomosis        800                               +--------------------+----------+-----------------+--------+    +------------+----------+-------------+----------+--------+ OUTFLOW VEINPSV (cm/s)Diameter (cm)Depth (cm)Describe +------------+----------+-------------+----------+--------+ Shoulder       120        0.52        0.63            +------------+----------+-------------+----------+--------+ Prox UA        138        0.52        0.45            +------------+----------+-------------+----------+--------+ Mid UA         136        0.67        0.31            +------------+----------+-------------+----------+--------+ Dist UA        356        0.58        0.71            +------------+----------+-------------+----------+--------+ AC Fossa       889        0.21                        +------------+----------+-------------+----------+--------+       Summary: Patent arteriovenous fistula with mild wall thickening and increased velocities in the immediate cephalic outflow vein.  *See table(s) above for measurements and observations.   Diagnosing physician: [name] MD  Incision: Left antecubital fossa incision is healing without signs of infection.  There is a good thrill and bruit in the fistula.  The fistula is palpable near the [facility] fossa but is not easily palpated more proximally towards the shoulder. Extremities: 2+ radial pulse.  5/5 grip strength.  No edema Neuro: Alert and oriented x4   Assessment/Plan:  This is a 64 y.o. female who is s/p: Left ulnar cephalic AV fistula and placement of right IJ tunneled dialysis catheter by Dr. Scot Dock on July 10, 2019.  Reviewing her ultrasound and on physical exam, I am not confident that there is currently enough maturation of the fistula to access.  I discussed this with the patient.  Recommend reevaluation in 4 weeks with duplex   -   Risa Grill, PA-C [facility] [phone]  Clinic MD:  Scot Dock

## 2019-08-17 ENCOUNTER — Ambulatory Visit (INDEPENDENT_AMBULATORY_CARE_PROVIDER_SITE_OTHER): Payer: Medicare Other | Admitting: Pharmacist Clinician (PhC)/ Clinical Pharmacy Specialist

## 2019-08-17 ENCOUNTER — Encounter: Payer: Self-pay | Admitting: Family Medicine

## 2019-08-17 DIAGNOSIS — Z992 Dependence on renal dialysis: Secondary | ICD-10-CM | POA: Insufficient documentation

## 2019-08-17 DIAGNOSIS — I480 Paroxysmal atrial fibrillation: Secondary | ICD-10-CM | POA: Diagnosis not present

## 2019-08-17 DIAGNOSIS — E1122 Type 2 diabetes mellitus with diabetic chronic kidney disease: Secondary | ICD-10-CM | POA: Diagnosis not present

## 2019-08-17 DIAGNOSIS — I130 Hypertensive heart and chronic kidney disease with heart failure and stage 1 through stage 4 chronic kidney disease, or unspecified chronic kidney disease: Secondary | ICD-10-CM | POA: Diagnosis not present

## 2019-08-17 DIAGNOSIS — Z794 Long term (current) use of insulin: Secondary | ICD-10-CM | POA: Diagnosis not present

## 2019-08-17 DIAGNOSIS — E1110 Type 2 diabetes mellitus with ketoacidosis without coma: Secondary | ICD-10-CM | POA: Diagnosis not present

## 2019-08-17 DIAGNOSIS — Z5181 Encounter for therapeutic drug level monitoring: Secondary | ICD-10-CM

## 2019-08-17 DIAGNOSIS — N184 Chronic kidney disease, stage 4 (severe): Secondary | ICD-10-CM | POA: Diagnosis not present

## 2019-08-17 DIAGNOSIS — I5042 Chronic combined systolic (congestive) and diastolic (congestive) heart failure: Secondary | ICD-10-CM | POA: Diagnosis not present

## 2019-08-17 LAB — POCT INR: INR: 2.2 (ref 2.0–3.0)

## 2019-08-18 DIAGNOSIS — D631 Anemia in chronic kidney disease: Secondary | ICD-10-CM | POA: Diagnosis not present

## 2019-08-18 DIAGNOSIS — N186 End stage renal disease: Secondary | ICD-10-CM | POA: Diagnosis not present

## 2019-08-18 DIAGNOSIS — D509 Iron deficiency anemia, unspecified: Secondary | ICD-10-CM | POA: Diagnosis not present

## 2019-08-18 DIAGNOSIS — E1129 Type 2 diabetes mellitus with other diabetic kidney complication: Secondary | ICD-10-CM | POA: Diagnosis not present

## 2019-08-18 DIAGNOSIS — N2581 Secondary hyperparathyroidism of renal origin: Secondary | ICD-10-CM | POA: Diagnosis not present

## 2019-08-18 DIAGNOSIS — Z992 Dependence on renal dialysis: Secondary | ICD-10-CM | POA: Diagnosis not present

## 2019-08-21 ENCOUNTER — Other Ambulatory Visit: Payer: Self-pay | Admitting: *Deleted

## 2019-08-21 DIAGNOSIS — N2581 Secondary hyperparathyroidism of renal origin: Secondary | ICD-10-CM | POA: Diagnosis not present

## 2019-08-21 DIAGNOSIS — N186 End stage renal disease: Secondary | ICD-10-CM | POA: Diagnosis not present

## 2019-08-21 DIAGNOSIS — Z992 Dependence on renal dialysis: Secondary | ICD-10-CM | POA: Diagnosis not present

## 2019-08-21 DIAGNOSIS — D631 Anemia in chronic kidney disease: Secondary | ICD-10-CM | POA: Diagnosis not present

## 2019-08-21 DIAGNOSIS — E1129 Type 2 diabetes mellitus with other diabetic kidney complication: Secondary | ICD-10-CM | POA: Diagnosis not present

## 2019-08-21 DIAGNOSIS — D509 Iron deficiency anemia, unspecified: Secondary | ICD-10-CM | POA: Diagnosis not present

## 2019-08-21 DIAGNOSIS — N185 Chronic kidney disease, stage 5: Secondary | ICD-10-CM

## 2019-08-23 DIAGNOSIS — N186 End stage renal disease: Secondary | ICD-10-CM | POA: Diagnosis not present

## 2019-08-23 DIAGNOSIS — D631 Anemia in chronic kidney disease: Secondary | ICD-10-CM | POA: Diagnosis not present

## 2019-08-23 DIAGNOSIS — N2581 Secondary hyperparathyroidism of renal origin: Secondary | ICD-10-CM | POA: Diagnosis not present

## 2019-08-23 DIAGNOSIS — D509 Iron deficiency anemia, unspecified: Secondary | ICD-10-CM | POA: Diagnosis not present

## 2019-08-23 DIAGNOSIS — E1129 Type 2 diabetes mellitus with other diabetic kidney complication: Secondary | ICD-10-CM | POA: Diagnosis not present

## 2019-08-23 DIAGNOSIS — Z992 Dependence on renal dialysis: Secondary | ICD-10-CM | POA: Diagnosis not present

## 2019-08-25 DIAGNOSIS — Z992 Dependence on renal dialysis: Secondary | ICD-10-CM | POA: Diagnosis not present

## 2019-08-25 DIAGNOSIS — D509 Iron deficiency anemia, unspecified: Secondary | ICD-10-CM | POA: Diagnosis not present

## 2019-08-25 DIAGNOSIS — E1129 Type 2 diabetes mellitus with other diabetic kidney complication: Secondary | ICD-10-CM | POA: Diagnosis not present

## 2019-08-25 DIAGNOSIS — N2581 Secondary hyperparathyroidism of renal origin: Secondary | ICD-10-CM | POA: Diagnosis not present

## 2019-08-25 DIAGNOSIS — D631 Anemia in chronic kidney disease: Secondary | ICD-10-CM | POA: Diagnosis not present

## 2019-08-25 DIAGNOSIS — N186 End stage renal disease: Secondary | ICD-10-CM | POA: Diagnosis not present

## 2019-08-28 DIAGNOSIS — D509 Iron deficiency anemia, unspecified: Secondary | ICD-10-CM | POA: Diagnosis not present

## 2019-08-28 DIAGNOSIS — N2581 Secondary hyperparathyroidism of renal origin: Secondary | ICD-10-CM | POA: Diagnosis not present

## 2019-08-28 DIAGNOSIS — E1129 Type 2 diabetes mellitus with other diabetic kidney complication: Secondary | ICD-10-CM | POA: Diagnosis not present

## 2019-08-28 DIAGNOSIS — D631 Anemia in chronic kidney disease: Secondary | ICD-10-CM | POA: Diagnosis not present

## 2019-08-28 DIAGNOSIS — N186 End stage renal disease: Secondary | ICD-10-CM | POA: Diagnosis not present

## 2019-08-28 DIAGNOSIS — Z992 Dependence on renal dialysis: Secondary | ICD-10-CM | POA: Diagnosis not present

## 2019-08-30 DIAGNOSIS — N2581 Secondary hyperparathyroidism of renal origin: Secondary | ICD-10-CM | POA: Diagnosis not present

## 2019-08-30 DIAGNOSIS — N186 End stage renal disease: Secondary | ICD-10-CM | POA: Diagnosis not present

## 2019-08-30 DIAGNOSIS — D631 Anemia in chronic kidney disease: Secondary | ICD-10-CM | POA: Diagnosis not present

## 2019-08-30 DIAGNOSIS — D509 Iron deficiency anemia, unspecified: Secondary | ICD-10-CM | POA: Diagnosis not present

## 2019-08-30 DIAGNOSIS — Z992 Dependence on renal dialysis: Secondary | ICD-10-CM | POA: Diagnosis not present

## 2019-08-30 DIAGNOSIS — E1129 Type 2 diabetes mellitus with other diabetic kidney complication: Secondary | ICD-10-CM | POA: Diagnosis not present

## 2019-08-31 ENCOUNTER — Other Ambulatory Visit: Payer: Self-pay

## 2019-08-31 ENCOUNTER — Ambulatory Visit (INDEPENDENT_AMBULATORY_CARE_PROVIDER_SITE_OTHER): Payer: Medicare Other | Admitting: Pharmacist Clinician (PhC)/ Clinical Pharmacy Specialist

## 2019-08-31 DIAGNOSIS — I480 Paroxysmal atrial fibrillation: Secondary | ICD-10-CM | POA: Diagnosis not present

## 2019-08-31 DIAGNOSIS — Z5181 Encounter for therapeutic drug level monitoring: Secondary | ICD-10-CM

## 2019-08-31 LAB — POCT INR: INR: 2.3 (ref 2.0–3.0)

## 2019-09-01 DIAGNOSIS — E1129 Type 2 diabetes mellitus with other diabetic kidney complication: Secondary | ICD-10-CM | POA: Diagnosis not present

## 2019-09-01 DIAGNOSIS — N186 End stage renal disease: Secondary | ICD-10-CM | POA: Diagnosis not present

## 2019-09-01 DIAGNOSIS — N2581 Secondary hyperparathyroidism of renal origin: Secondary | ICD-10-CM | POA: Diagnosis not present

## 2019-09-01 DIAGNOSIS — Z992 Dependence on renal dialysis: Secondary | ICD-10-CM | POA: Diagnosis not present

## 2019-09-01 DIAGNOSIS — D631 Anemia in chronic kidney disease: Secondary | ICD-10-CM | POA: Diagnosis not present

## 2019-09-01 DIAGNOSIS — D509 Iron deficiency anemia, unspecified: Secondary | ICD-10-CM | POA: Diagnosis not present

## 2019-09-04 DIAGNOSIS — E1129 Type 2 diabetes mellitus with other diabetic kidney complication: Secondary | ICD-10-CM | POA: Diagnosis not present

## 2019-09-04 DIAGNOSIS — N2581 Secondary hyperparathyroidism of renal origin: Secondary | ICD-10-CM | POA: Diagnosis not present

## 2019-09-04 DIAGNOSIS — D631 Anemia in chronic kidney disease: Secondary | ICD-10-CM | POA: Diagnosis not present

## 2019-09-04 DIAGNOSIS — N186 End stage renal disease: Secondary | ICD-10-CM | POA: Diagnosis not present

## 2019-09-04 DIAGNOSIS — D509 Iron deficiency anemia, unspecified: Secondary | ICD-10-CM | POA: Diagnosis not present

## 2019-09-04 DIAGNOSIS — Z992 Dependence on renal dialysis: Secondary | ICD-10-CM | POA: Diagnosis not present

## 2019-09-06 DIAGNOSIS — D509 Iron deficiency anemia, unspecified: Secondary | ICD-10-CM | POA: Diagnosis not present

## 2019-09-06 DIAGNOSIS — D631 Anemia in chronic kidney disease: Secondary | ICD-10-CM | POA: Diagnosis not present

## 2019-09-06 DIAGNOSIS — Z992 Dependence on renal dialysis: Secondary | ICD-10-CM | POA: Diagnosis not present

## 2019-09-06 DIAGNOSIS — E1129 Type 2 diabetes mellitus with other diabetic kidney complication: Secondary | ICD-10-CM | POA: Diagnosis not present

## 2019-09-06 DIAGNOSIS — N186 End stage renal disease: Secondary | ICD-10-CM | POA: Diagnosis not present

## 2019-09-06 DIAGNOSIS — N2581 Secondary hyperparathyroidism of renal origin: Secondary | ICD-10-CM | POA: Diagnosis not present

## 2019-09-08 DIAGNOSIS — E1129 Type 2 diabetes mellitus with other diabetic kidney complication: Secondary | ICD-10-CM | POA: Diagnosis not present

## 2019-09-08 DIAGNOSIS — D509 Iron deficiency anemia, unspecified: Secondary | ICD-10-CM | POA: Diagnosis not present

## 2019-09-08 DIAGNOSIS — N186 End stage renal disease: Secondary | ICD-10-CM | POA: Diagnosis not present

## 2019-09-08 DIAGNOSIS — N2581 Secondary hyperparathyroidism of renal origin: Secondary | ICD-10-CM | POA: Diagnosis not present

## 2019-09-08 DIAGNOSIS — Z992 Dependence on renal dialysis: Secondary | ICD-10-CM | POA: Diagnosis not present

## 2019-09-08 DIAGNOSIS — D631 Anemia in chronic kidney disease: Secondary | ICD-10-CM | POA: Diagnosis not present

## 2019-09-11 DIAGNOSIS — Z992 Dependence on renal dialysis: Secondary | ICD-10-CM | POA: Diagnosis not present

## 2019-09-11 DIAGNOSIS — E1122 Type 2 diabetes mellitus with diabetic chronic kidney disease: Secondary | ICD-10-CM | POA: Diagnosis not present

## 2019-09-11 DIAGNOSIS — E1129 Type 2 diabetes mellitus with other diabetic kidney complication: Secondary | ICD-10-CM | POA: Diagnosis not present

## 2019-09-11 DIAGNOSIS — D631 Anemia in chronic kidney disease: Secondary | ICD-10-CM | POA: Diagnosis not present

## 2019-09-11 DIAGNOSIS — N186 End stage renal disease: Secondary | ICD-10-CM | POA: Diagnosis not present

## 2019-09-11 DIAGNOSIS — N2581 Secondary hyperparathyroidism of renal origin: Secondary | ICD-10-CM | POA: Diagnosis not present

## 2019-09-11 DIAGNOSIS — D509 Iron deficiency anemia, unspecified: Secondary | ICD-10-CM | POA: Diagnosis not present

## 2019-09-13 DIAGNOSIS — N186 End stage renal disease: Secondary | ICD-10-CM | POA: Diagnosis not present

## 2019-09-13 DIAGNOSIS — Z992 Dependence on renal dialysis: Secondary | ICD-10-CM | POA: Diagnosis not present

## 2019-09-13 DIAGNOSIS — E1129 Type 2 diabetes mellitus with other diabetic kidney complication: Secondary | ICD-10-CM | POA: Diagnosis not present

## 2019-09-13 DIAGNOSIS — N2581 Secondary hyperparathyroidism of renal origin: Secondary | ICD-10-CM | POA: Diagnosis not present

## 2019-09-13 DIAGNOSIS — D631 Anemia in chronic kidney disease: Secondary | ICD-10-CM | POA: Diagnosis not present

## 2019-09-13 DIAGNOSIS — D509 Iron deficiency anemia, unspecified: Secondary | ICD-10-CM | POA: Diagnosis not present

## 2019-09-15 DIAGNOSIS — E1129 Type 2 diabetes mellitus with other diabetic kidney complication: Secondary | ICD-10-CM | POA: Diagnosis not present

## 2019-09-15 DIAGNOSIS — Z992 Dependence on renal dialysis: Secondary | ICD-10-CM | POA: Diagnosis not present

## 2019-09-15 DIAGNOSIS — D509 Iron deficiency anemia, unspecified: Secondary | ICD-10-CM | POA: Diagnosis not present

## 2019-09-15 DIAGNOSIS — N186 End stage renal disease: Secondary | ICD-10-CM | POA: Diagnosis not present

## 2019-09-15 DIAGNOSIS — N2581 Secondary hyperparathyroidism of renal origin: Secondary | ICD-10-CM | POA: Diagnosis not present

## 2019-09-15 DIAGNOSIS — D631 Anemia in chronic kidney disease: Secondary | ICD-10-CM | POA: Diagnosis not present

## 2019-09-18 DIAGNOSIS — E1129 Type 2 diabetes mellitus with other diabetic kidney complication: Secondary | ICD-10-CM | POA: Diagnosis not present

## 2019-09-18 DIAGNOSIS — D509 Iron deficiency anemia, unspecified: Secondary | ICD-10-CM | POA: Diagnosis not present

## 2019-09-18 DIAGNOSIS — Z992 Dependence on renal dialysis: Secondary | ICD-10-CM | POA: Diagnosis not present

## 2019-09-18 DIAGNOSIS — D631 Anemia in chronic kidney disease: Secondary | ICD-10-CM | POA: Diagnosis not present

## 2019-09-18 DIAGNOSIS — N2581 Secondary hyperparathyroidism of renal origin: Secondary | ICD-10-CM | POA: Diagnosis not present

## 2019-09-18 DIAGNOSIS — N186 End stage renal disease: Secondary | ICD-10-CM | POA: Diagnosis not present

## 2019-09-19 ENCOUNTER — Telehealth: Payer: Self-pay

## 2019-09-19 ENCOUNTER — Telehealth: Payer: Medicare Other | Admitting: Cardiovascular Disease

## 2019-09-20 DIAGNOSIS — N186 End stage renal disease: Secondary | ICD-10-CM | POA: Diagnosis not present

## 2019-09-20 DIAGNOSIS — D631 Anemia in chronic kidney disease: Secondary | ICD-10-CM | POA: Diagnosis not present

## 2019-09-20 DIAGNOSIS — N2581 Secondary hyperparathyroidism of renal origin: Secondary | ICD-10-CM | POA: Diagnosis not present

## 2019-09-20 DIAGNOSIS — Z992 Dependence on renal dialysis: Secondary | ICD-10-CM | POA: Diagnosis not present

## 2019-09-20 DIAGNOSIS — E1129 Type 2 diabetes mellitus with other diabetic kidney complication: Secondary | ICD-10-CM | POA: Diagnosis not present

## 2019-09-20 DIAGNOSIS — D509 Iron deficiency anemia, unspecified: Secondary | ICD-10-CM | POA: Diagnosis not present

## 2019-09-21 ENCOUNTER — Ambulatory Visit (HOSPITAL_COMMUNITY)
Admission: RE | Admit: 2019-09-21 | Discharge: 2019-09-21 | Disposition: A | Discharge status: Home / Self Care | Payer: Medicare Other | Source: Ambulatory Visit | Attending: Vascular Surgery | Admitting: Vascular Surgery

## 2019-09-21 ENCOUNTER — Ambulatory Visit (INDEPENDENT_AMBULATORY_CARE_PROVIDER_SITE_OTHER): Payer: Medicare Other | Admitting: Pharmacist Clinician (PhC)/ Clinical Pharmacy Specialist

## 2019-09-21 ENCOUNTER — Ambulatory Visit (INDEPENDENT_AMBULATORY_CARE_PROVIDER_SITE_OTHER): Payer: Self-pay | Admitting: Physician Assistant

## 2019-09-21 ENCOUNTER — Other Ambulatory Visit: Payer: Self-pay

## 2019-09-21 VITALS — BP 182/70 | HR 65 | Temp 97.7°F | Resp 20 | Ht 66.0 in | Wt 163.0 lb

## 2019-09-21 DIAGNOSIS — N185 Chronic kidney disease, stage 5: Secondary | ICD-10-CM | POA: Diagnosis not present

## 2019-09-21 DIAGNOSIS — I480 Paroxysmal atrial fibrillation: Secondary | ICD-10-CM | POA: Diagnosis not present

## 2019-09-21 DIAGNOSIS — Z5181 Encounter for therapeutic drug level monitoring: Secondary | ICD-10-CM

## 2019-09-21 LAB — POCT INR: INR: 1.1 — AB (ref 2.0–3.0)

## 2019-09-21 NOTE — Progress Notes (Signed)
Postoperative Access Visit   History of Present Illness   Amber Young is a 64 y.o. year old female who presents for postoperative follow-up for: left ulnar cephalic arteriovenous fistula and TDC (Date: 07/10/19) by Dr. Scot Dock. She was last seen on 08/16/19 for follow up and duplex at which time it was felt that the fistula was not matured enough for access. She is here for re-evaluation.   The patient's wounds are well healed.  The patient notes no steal symptoms.  The patient is able to complete their activities of daily living. She denies any weakness, numbness, or tingling in left arm or hand. She does state her left finger tips at times get cold but it does not cause any pain  She is presently on both Brillinta and Coumadin for paroxysmal atrial fibrillation  Physical Examination   Vitals:   09/21/19 1234  BP: (!) 182/70  Pulse: 65  Resp: 20  Temp: 97.7 F (36.5 C)  SpO2: 98%  Weight: 163 lb (73.9 kg)  Height: 5\' 6"  (1.676 m)   Body mass index is 26.31 kg/m.  General: pleasant, well nourished, in no discomfort Lungs: clear to auscultation bilaterally, equal expansion Heart: regular rate and rhythm Abdomen: Soft, NT, normal bowel sounds Incision: Left arm AC Incision is well healed, 2+ radial pulse, hand grip is 5/5, sensation in digits is intact, palpable thrill, bruit can be auscultated. The fistula is palpable from [facility] over about 5 cm towards the shoulder then it is a little more deep and difficult to palpate. Left hand warm. Neuro: Alert and oriented   Findings:  +--------------------+----------+-----------------+--------+  AVF         PSV (cm/s)Flow Vol (mL/min)Comments  +--------------------+----------+-----------------+--------+  Native artery inflow  236      941          +--------------------+----------+-----------------+--------+  AVF Anastomosis     889                   +--------------------+----------+-----------------+--------+     +------------+----------+-------------+----------+--------+  OUTFLOW VEINPSV (cm/s)Diameter (cm)Depth (cm)Describe  +------------+----------+-------------+----------+--------+  Prox UA     130    0.46     0.66        +------------+----------+-------------+----------+--------+  Mid UA     125    0.59     0.38        +------------+----------+-------------+----------+--------+  Dist UA     331    0.83     0.24        +------------+----------+-------------+----------+--------+  AC Fossa    408    0.41     0.74        +------------+----------+-------------+----------+--------+    Summary:  Patent arteriovenous fistula with no visualized stenosis.       Medical Decision Making   Amber Young is a 64 y.o. year old female who presents s/p left ulnar cephalic av fistula on [id number] by Dr. Scot Dock. Her ultrasound today is essentially unchanged from her previous visit. Slight maturation of the av fistula. She is without signs or symptoms of steal syndrome.   The patient's access is ready to be attempted to be used. Her dialysis access duplex is borderline for adequate maturation for access and also clinically in the upper arm on exam it is superficial over enough length that access may be possible without requiring a superficialization. I have discussed this with the patient. I also discussed with her that if dialysis attempts to use her fistula and is unable to successfully dialyze her  then she will need a subsequent surgical revision   The patient's tunneled dialysis catheter can be removed when Nephrology is comfortable with the performance of the left av fistula. She will need schedule for removal of the [facility] at this time  The patient may follow up on a prn basis   Karoline Caldwell, PA-C [facility] [place] Office: [phone]  Clinic MD: Dr. Scot Dock

## 2019-09-21 NOTE — Patient Instructions (Signed)
Take 2 tablets for 2 days, then continue with 1 tablet daily except for 7.5mg  every Monday, Wednesday and Friday. Repeat INR in 2 weeks

## 2019-09-22 DIAGNOSIS — D509 Iron deficiency anemia, unspecified: Secondary | ICD-10-CM | POA: Diagnosis not present

## 2019-09-22 DIAGNOSIS — N2581 Secondary hyperparathyroidism of renal origin: Secondary | ICD-10-CM | POA: Diagnosis not present

## 2019-09-22 DIAGNOSIS — E1129 Type 2 diabetes mellitus with other diabetic kidney complication: Secondary | ICD-10-CM | POA: Diagnosis not present

## 2019-09-22 DIAGNOSIS — Z992 Dependence on renal dialysis: Secondary | ICD-10-CM | POA: Diagnosis not present

## 2019-09-22 DIAGNOSIS — N186 End stage renal disease: Secondary | ICD-10-CM | POA: Diagnosis not present

## 2019-09-22 DIAGNOSIS — D631 Anemia in chronic kidney disease: Secondary | ICD-10-CM | POA: Diagnosis not present

## 2019-09-25 DIAGNOSIS — Z992 Dependence on renal dialysis: Secondary | ICD-10-CM | POA: Diagnosis not present

## 2019-09-25 DIAGNOSIS — N186 End stage renal disease: Secondary | ICD-10-CM | POA: Diagnosis not present

## 2019-09-25 DIAGNOSIS — N2581 Secondary hyperparathyroidism of renal origin: Secondary | ICD-10-CM | POA: Diagnosis not present

## 2019-09-25 DIAGNOSIS — E1129 Type 2 diabetes mellitus with other diabetic kidney complication: Secondary | ICD-10-CM | POA: Diagnosis not present

## 2019-09-25 DIAGNOSIS — D509 Iron deficiency anemia, unspecified: Secondary | ICD-10-CM | POA: Diagnosis not present

## 2019-09-25 DIAGNOSIS — D631 Anemia in chronic kidney disease: Secondary | ICD-10-CM | POA: Diagnosis not present

## 2019-09-27 DIAGNOSIS — D631 Anemia in chronic kidney disease: Secondary | ICD-10-CM | POA: Diagnosis not present

## 2019-09-27 DIAGNOSIS — N2581 Secondary hyperparathyroidism of renal origin: Secondary | ICD-10-CM | POA: Diagnosis not present

## 2019-09-27 DIAGNOSIS — E1129 Type 2 diabetes mellitus with other diabetic kidney complication: Secondary | ICD-10-CM | POA: Diagnosis not present

## 2019-09-27 DIAGNOSIS — Z992 Dependence on renal dialysis: Secondary | ICD-10-CM | POA: Diagnosis not present

## 2019-09-27 DIAGNOSIS — D509 Iron deficiency anemia, unspecified: Secondary | ICD-10-CM | POA: Diagnosis not present

## 2019-09-27 DIAGNOSIS — N186 End stage renal disease: Secondary | ICD-10-CM | POA: Diagnosis not present

## 2019-09-27 NOTE — Telephone Encounter (Signed)
Made in error

## 2019-09-28 ENCOUNTER — Ambulatory Visit: Payer: Medicare Other | Admitting: Gastroenterology

## 2019-09-29 DIAGNOSIS — E1129 Type 2 diabetes mellitus with other diabetic kidney complication: Secondary | ICD-10-CM | POA: Diagnosis not present

## 2019-09-29 DIAGNOSIS — D631 Anemia in chronic kidney disease: Secondary | ICD-10-CM | POA: Diagnosis not present

## 2019-09-29 DIAGNOSIS — Z992 Dependence on renal dialysis: Secondary | ICD-10-CM | POA: Diagnosis not present

## 2019-09-29 DIAGNOSIS — N186 End stage renal disease: Secondary | ICD-10-CM | POA: Diagnosis not present

## 2019-09-29 DIAGNOSIS — N2581 Secondary hyperparathyroidism of renal origin: Secondary | ICD-10-CM | POA: Diagnosis not present

## 2019-09-29 DIAGNOSIS — D509 Iron deficiency anemia, unspecified: Secondary | ICD-10-CM | POA: Diagnosis not present

## 2019-10-02 DIAGNOSIS — N186 End stage renal disease: Secondary | ICD-10-CM | POA: Diagnosis not present

## 2019-10-02 DIAGNOSIS — D509 Iron deficiency anemia, unspecified: Secondary | ICD-10-CM | POA: Diagnosis not present

## 2019-10-02 DIAGNOSIS — Z992 Dependence on renal dialysis: Secondary | ICD-10-CM | POA: Diagnosis not present

## 2019-10-02 DIAGNOSIS — N2581 Secondary hyperparathyroidism of renal origin: Secondary | ICD-10-CM | POA: Diagnosis not present

## 2019-10-02 DIAGNOSIS — E1129 Type 2 diabetes mellitus with other diabetic kidney complication: Secondary | ICD-10-CM | POA: Diagnosis not present

## 2019-10-02 DIAGNOSIS — D631 Anemia in chronic kidney disease: Secondary | ICD-10-CM | POA: Diagnosis not present

## 2019-10-04 DIAGNOSIS — N2581 Secondary hyperparathyroidism of renal origin: Secondary | ICD-10-CM | POA: Diagnosis not present

## 2019-10-04 DIAGNOSIS — Z992 Dependence on renal dialysis: Secondary | ICD-10-CM | POA: Diagnosis not present

## 2019-10-04 DIAGNOSIS — D631 Anemia in chronic kidney disease: Secondary | ICD-10-CM | POA: Diagnosis not present

## 2019-10-04 DIAGNOSIS — E1129 Type 2 diabetes mellitus with other diabetic kidney complication: Secondary | ICD-10-CM | POA: Diagnosis not present

## 2019-10-04 DIAGNOSIS — D509 Iron deficiency anemia, unspecified: Secondary | ICD-10-CM | POA: Diagnosis not present

## 2019-10-04 DIAGNOSIS — N186 End stage renal disease: Secondary | ICD-10-CM | POA: Diagnosis not present

## 2019-10-05 ENCOUNTER — Other Ambulatory Visit: Payer: Self-pay

## 2019-10-05 ENCOUNTER — Ambulatory Visit (INDEPENDENT_AMBULATORY_CARE_PROVIDER_SITE_OTHER): Payer: Medicare Other | Admitting: Cardiovascular Disease

## 2019-10-05 ENCOUNTER — Encounter: Payer: Self-pay | Admitting: Cardiovascular Disease

## 2019-10-05 ENCOUNTER — Ambulatory Visit (INDEPENDENT_AMBULATORY_CARE_PROVIDER_SITE_OTHER): Payer: Medicare Other | Admitting: Pharmacist Clinician (PhC)/ Clinical Pharmacy Specialist

## 2019-10-05 VITALS — BP 134/62 | HR 61 | Ht 66.0 in | Wt 165.0 lb

## 2019-10-05 DIAGNOSIS — I480 Paroxysmal atrial fibrillation: Secondary | ICD-10-CM

## 2019-10-05 DIAGNOSIS — I5042 Chronic combined systolic (congestive) and diastolic (congestive) heart failure: Secondary | ICD-10-CM

## 2019-10-05 DIAGNOSIS — Z5181 Encounter for therapeutic drug level monitoring: Secondary | ICD-10-CM

## 2019-10-05 DIAGNOSIS — I10 Essential (primary) hypertension: Secondary | ICD-10-CM | POA: Diagnosis not present

## 2019-10-05 LAB — POCT INR: INR: 1.2 — AB (ref 2.0–3.0)

## 2019-10-05 NOTE — Progress Notes (Signed)
Cardiology Office Note   Date:  10/05/2019   ID:  Amber Young, DOB Sep 23, 1955, MRN [id number]  PCP:  Amber Glatter, FNP  Cardiologist:   Amber Latch, MD   No chief complaint on file.    History of Present Illness: Amber Young is a 64 y.o. female with CAD status post LAD PCI in 2017, RCA 2019, paroxysmal atrial fibrillation, chronic systolic and diastolic heart failure (LVEF 25-30% improved to 35 to 40%), hypertension, hyperlipidemia, prior stroke, and ESRD on HD here for follow up.  She initially established care 04/2019.  She was previously a patient of Dr. Bronson Young but moved to [place] from [place].  She was admitted to the hospital 01/2019 with acute heart failure exacerbation.  BNP was elevated to 2666 in the setting of hypertensive urgency due to medication noncompliance.  She had an echo at that time that revealed LVEF 25 to 30% which was reduced from 40 to 45% previously.  She was not started on an ARB/ARNI due to her chronic kidney disease.  Cardiac catheterization was not pursued due to her renal dysfunction and lack of chest pain.  At her first telemedicine visit se was volume overloaded and lasix and metolazone were increased.  She saw Amber Memos, NP later that week in the office and her edema and orthopnea were improving.  Lasix was reduced back to 80mg  bid with metolazone q3 days.  However her symptoms recurred 06/2019.  She was seen in the office 06/29/19 and was markedly volume overloaded despite increasing lasix and metolazone.  She was direct admitted to the hospital for diuresis.  During that hospitalization she was started on HD and had a fistula placed in the L upper extremity.  At her last complaint Amber Young was doing well. She goes to HD MWF and tolerates a full session.  Amlodipine and hydralazine were both discontinued by her nephrologist.  She continued to have lower extremity edema.  Hydralazine was restarted.  Since that time amlodipine  was also added.  She is unsure what her blood pressure has been running lately.  She notes that it dropped 1 time in hemodialysis a couple weeks ago.  Otherwise she has tolerated all her dialysis sessions and full.  She is still unsure of who her nephrologist is.  She notes that her breathing has been okay and she denies any chest pain.  She denies lower extremity edema, orthopnea, or PND.  Overall she is doing well.     Past Medical History:  Diagnosis Date  . Anemia   . Atrial fibrillation ([place]) 06/17/2019  . Cataract, bilateral   . Chronic combined systolic and diastolic CHF (congestive heart failure) ([place])    a. EF 40-45% in 2019 b. EF 25-30% by repeat echo in 01/2019  . CKD (chronic kidney disease), stage IV ([place])   . Coronary artery disease 08/2017   DES x 2 RCA  . Dialysis patient ([place]) 07/2019  . DKA (diabetic ketoacidoses) ([place]) 06/16/2019  . GERD (gastroesophageal reflux disease)   . Glaucoma, left eye   . Heart murmur   . Hyperlipidemia   . Hypertension   . Proliferative diabetic retinopathy ([place])    left eye with vitreous hemorrhage and tractional retinal detachment  . Stroke ([place]) 09/2014   numbness left upper lip, finger tips on left hand; "resolved" (05/18/2016)  . Type II diabetes mellitus ([place])   . Wears glasses     Past Surgical History:  Procedure Laterality Date  . AV FISTULA  PLACEMENT Left 07/10/2019   Procedure: ARTERIOVENOUS (AV) FISTULA CREATION;  Surgeon: Amber Mould, MD;  Location: [place];  Service: Vascular;  Laterality: Left;  . CARDIAC CATHETERIZATION N/A 05/18/2016   Procedure: Left Heart Cath and Coronary Angiography;  Surgeon: Amber Booze, MD;  Location: [place] [facility];  Service: Cardiovascular;  Laterality: N/A;  . CARDIAC CATHETERIZATION N/A 05/18/2016   Procedure: Coronary Stent Intervention;  Surgeon: Amber Booze, MD;  Location: [place] [facility];  Service: Cardiovascular;  Laterality: N/A;  . CATARACT EXTRACTION Right  2020  . COLONOSCOPY W/ BIOPSIES AND POLYPECTOMY    . CORONARY ANGIOPLASTY    . CORONARY ANGIOPLASTY WITH STENT PLACEMENT    . CORONARY STENT INTERVENTION N/A 09/07/2017   Procedure: CORONARY STENT INTERVENTION;  Surgeon: Amber Man, MD;  Location: [place] [facility];  Service: Cardiovascular;  Laterality: N/A;  . EYE SURGERY    . GAS INSERTION Left 01/13/2018   Procedure: INSERTION OF GAS;  Surgeon: Amber Caffey, MD;  Location: [place];  Service: Ophthalmology;  Laterality: Left;  . INSERTION OF DIALYSIS CATHETER Right 07/10/2019   Procedure: INSERTION OF TUNNELED DIALYSIS CATHETER;  Surgeon: Amber Mould, MD;  Location: [place];  Service: Vascular;  Laterality: Right;  . IR FLUORO GUIDE CV LINE RIGHT  07/05/2019  . IR US GUIDE VASC ACCESS RIGHT  07/05/2019  . LEFT HEART CATH AND CORONARY ANGIOGRAPHY N/A 09/07/2017   Procedure: LEFT HEART CATH AND CORONARY ANGIOGRAPHY;  Surgeon: Amber Man, MD;  Location: [place] [facility];  Service: Cardiovascular;  Laterality: N/A;  . MEMBRANE PEEL Left 01/13/2018   Procedure: MEMBRANE PEEL LEFT EYE ;  Surgeon: Amber Caffey, MD;  Location: [place];  Service: Ophthalmology;  Laterality: Left;  Marland Kitchen MULTIPLE TOOTH EXTRACTIONS    . PARS PLANA VITRECTOMY Left 01/13/2018   Procedure: PARS PLANA VITRECTOMY WITH 25 GAUGE LEFT EYE WITH ENDOLASER;  Surgeon: Amber Caffey, MD;  Location: [place];  Service: Ophthalmology;  Laterality: Left;  . TUBAL LIGATION  1980  . VAGINAL HYSTERECTOMY  1999   "partial; fibroids"     Current Outpatient Medications  Medication Sig Dispense Refill  . aspirin EC 81 MG tablet Take 81 mg by mouth daily.    Marland Kitchen acetaminophen (TYLENOL) 500 MG tablet Take 2 tablets (1,000 mg total) by mouth every 6 (six) hours as needed for mild pain or headache. 30 tablet 11  . ALPRAZolam (XANAX) 0.25 MG tablet Take 1 tablet (0.25 mg total) by mouth 2 (two) times daily as needed for anxiety. 20 tablet 0  . amLODipine (NORVASC) 10 MG tablet TAKE  1 TABLET (10 MG TOTAL) BY MOUTH DAILY. 90 tablet 2  . atorvastatin (LIPITOR) 40 MG tablet Take 1 tablet (40 mg total) by mouth daily at 6 PM. 90 tablet 1  . brimonidine (ALPHAGAN) 0.2 % ophthalmic solution Place 1 drop into the left eye 2 (two) times daily.    . carvedilol (COREG) 25 MG tablet Take 1 tablet (25 mg total) by mouth 2 (two) times daily. 180 tablet 1  . dorzolamide-timolol (COSOPT) 22.3-6.8 MG/ML ophthalmic solution Place 1 drop into the left eye 2 (two) times daily.    . feeding supplement, ENSURE ENLIVE, (ENSURE ENLIVE) LIQD Take 237 mLs by mouth 2 (two) times daily between meals. 237 mL 12  . ferric citrate (AURYXIA) 1 GM 210 MG(Fe) tablet Take by mouth.    . ferrous sulfate (FERROUSUL) 325 (65 FE) MG tablet Take 1 tablet (325  mg total) by mouth every other day. 90 tablet 1  . glipiZIDE (GLUCOTROL) 10 MG tablet Take 1 tablet (10 mg total) by mouth 2 (two) times daily before a meal. 180 tablet 1  . hydrALAZINE (APRESOLINE) 50 MG tablet Take 1 tablet (50 mg total) by mouth 2 (two) times daily. 180 tablet 1  . Insulin Glargine (LANTUS) 100 UNIT/ML Solostar Pen Inject 4 Units into the skin daily. 9 mL 1  . Insulin Pen Needle (TRUEPLUS PEN NEEDLES) 31G X 6 MM MISC USE AS DIRECTED 300 each 1  . isosorbide mononitrate (IMDUR) 30 MG 24 hr tablet Take 1 tablet (30 mg total) by mouth daily. 30 tablet 0  . ketorolac (ACULAR) 0.4 % SOLN Place 1 drop into the right eye 4 (four) times daily.    . multivitamin (RENA-VIT) TABS tablet Take 1 tablet by mouth at bedtime. 30 tablet 0  . nitroGLYCERIN (NITROSTAT) 0.4 MG SL tablet Place 1 tablet (0.4 mg total) under the tongue every 5 (five) minutes x 3 doses as needed for chest pain. 90 tablet 1  . ofloxacin (OCUFLOX) 0.3 % ophthalmic solution Place 1 drop into the right eye 3 (three) times daily.    Marland Kitchen warfarin (COUMADIN) 5 MG tablet Take 5mg  daily except for 7.5mg  every Monday, Wednesday and Friday (Patient taking differently: Take 5-7.5 mg by mouth See  admin instructions. Take 5mg  daily except for 7.5mg  every Monday, Wednesday and Friday) 90 tablet 1   No current facility-administered medications for this visit.    Allergies:   Ace inhibitors    Social History:  The patient  reports that she has never smoked. She has never used smokeless tobacco. She reports that she does not drink alcohol or use drugs.   Family History:  The patient's family history includes COPD in her mother; Diabetes in her mother; Heart failure in her mother; Hypertension in her brother, mother, and sister.    ROS:  Please see the history of present illness.   Otherwise, review of systems are positive for none.   All other systems are reviewed and negative.    PHYSICAL EXAM: VS:  BP 134/62   Pulse 61   Ht 5\' 6"  (1.676 m)   Wt 165 lb (74.8 kg)   SpO2 97%   BMI 26.63 kg/m  , BMI Body mass index is 26.63 kg/m. GENERAL:  Chronically ill-appearing HEENT: Pupils equal round and reactive, fundi not visualized, oral mucosa unremarkable NECK:  No jugular venous distention, waveform within normal limits, carotid upstroke brisk and symmetric, +L carotid bruit LUNGS:  Clear to auscultation bilaterally HEART:  RRR.  PMI not displaced or sustained,S1 and S2 within normal limits, + S3, no S4, no clicks, no rubs, no murmurs ABD:  Flat, positive bowel sounds normal in frequency in pitch, no bruits, no rebound, no guarding, no midline pulsatile mass, no hepatomegaly, no splenomegaly EXT:  2 plus pulses throughout, no LE edema, no cyanosis no clubbing SKIN:  No rashes no nodules NEURO:  Cranial nerves II through XII grossly intact, motor grossly intact throughout PSYCH:  Cognitively intact, oriented to person place and time   EKG:  EKG is not ordered today.   Recent Labs: 06/29/2019: ALT 31; B Natriuretic Peptide 2,340.4; TSH 1.162 07/10/2019: Magnesium 2.1 07/13/2019: BUN 38; Creatinine, Ser 4.59; Hemoglobin 8.4; Platelets 309; Potassium 3.9; Sodium 131    Lipid  Panel    Component Value Date/Time   CHOL 217 (H) 01/28/2019 0536   TRIG 59 01/28/2019 0536  HDL 75 01/28/2019 0536   CHOLHDL 2.9 01/28/2019 0536   VLDL 12 01/28/2019 0536   LDLCALC 130 (H) 01/28/2019 0536      Wt Readings from Last 3 Encounters:  10/05/19 165 lb (74.8 kg)  09/21/19 163 lb (73.9 kg)  08/16/19 158 lb (71.7 kg)      ASSESSMENT AND PLAN:  # Chronic systolic and diastolic heart failure: # Essential hypertension: LVEF  20-25% from 40-45% 07/2018.  She has ischemic cardiomyopathy and a history of poorly controlled BP.  She hasn't been on guideline directed therapy 2/2 CKD IV.  She is now on HD.  If her blood pressure remains poorly controlled in the future, would plan to add an ARB.  She is unsure of who her nephrologist is or where she dialyzes.  Continue amlodipine, carvedilol Ebony Hail, and isosorbide.  # PAF:   Currently in sinus rhythm.  She is on warfarin for anticoagulation.  INR is 1.2 and frequently subtherapeutic.  She seems to have very limited medical insight.  It is unclear what she is taking at home.  We will work with our Education officer, museum to help with medication assistance and adherence in the home.  Continue carvedilol and warfarin for now.  Consider Eliquis despite the fact that she is on HD.  # CAD: She had two DESs placed in the RCA 08/2017 and one in the LAD in 2017.  No ischemic symptoms. Continue carvedilol, Isordil, and atorvastatin.  Stop ticagrelor and start aspirin 81 mg daily.  # ESRD: Now on HD MWF.    Current medicines are reviewed at length with the patient today.  The patient does not have concerns regarding medicines.  The following changes have been made:  none  Labs/ tests ordered today include:  No orders of the defined types were placed in this encounter.    Disposition:   FU with Lekendrick Alpern C. Oval Linsey, MD, [facility] in 6 months    Signed, Luisdavid Hamblin C. Oval Linsey, MD, [facility]  10/05/2019 9:34 AM    [place]

## 2019-10-05 NOTE — Patient Instructions (Signed)
(  dialysis MWF).  Take 2 tablets today, then 1.5 tablets daily. Repeat INR in 2 weeks

## 2019-10-05 NOTE — Patient Instructions (Signed)
Medication Instructions:  STOP BRILINTA   START ASPIRIN 81 MG DAILY   *If you need a refill on your cardiac medications before your next appointment, please call your pharmacy*  Lab Work: NONE   Testing/Procedures: NONE   Follow-Up: At [facility], you and your health needs are our priority.  As part of our continuing mission to provide you with exceptional heart care, we have created designated Provider Care Teams.  These Care Teams include your primary Cardiologist (physician) and Advanced Practice Providers (APPs -  Physician Assistants and Nurse Practitioners) who all work together to provide you with the care you need, when you need it.  Your next appointment:   6 month(s)  You will receive a reminder letter in the mail two months in advance. If you don't receive a letter, please call our office to schedule the follow-up appointment.  The format for your next appointment:   In Person  Provider:   You may see Skeet Latch, MD or one of the following Advanced Practice Providers on your designated Care Team:    Kerin Ransom, PA-C  [place], [place]  Coletta Memos, [place]

## 2019-10-06 DIAGNOSIS — E1129 Type 2 diabetes mellitus with other diabetic kidney complication: Secondary | ICD-10-CM | POA: Diagnosis not present

## 2019-10-06 DIAGNOSIS — Z992 Dependence on renal dialysis: Secondary | ICD-10-CM | POA: Diagnosis not present

## 2019-10-06 DIAGNOSIS — D631 Anemia in chronic kidney disease: Secondary | ICD-10-CM | POA: Diagnosis not present

## 2019-10-06 DIAGNOSIS — N2581 Secondary hyperparathyroidism of renal origin: Secondary | ICD-10-CM | POA: Diagnosis not present

## 2019-10-06 DIAGNOSIS — D509 Iron deficiency anemia, unspecified: Secondary | ICD-10-CM | POA: Diagnosis not present

## 2019-10-06 DIAGNOSIS — N186 End stage renal disease: Secondary | ICD-10-CM | POA: Diagnosis not present

## 2019-10-09 DIAGNOSIS — D631 Anemia in chronic kidney disease: Secondary | ICD-10-CM | POA: Diagnosis not present

## 2019-10-09 DIAGNOSIS — E8770 Fluid overload, unspecified: Secondary | ICD-10-CM | POA: Diagnosis not present

## 2019-10-09 DIAGNOSIS — Z992 Dependence on renal dialysis: Secondary | ICD-10-CM | POA: Diagnosis not present

## 2019-10-09 DIAGNOSIS — E1122 Type 2 diabetes mellitus with diabetic chronic kidney disease: Secondary | ICD-10-CM | POA: Diagnosis not present

## 2019-10-09 DIAGNOSIS — D509 Iron deficiency anemia, unspecified: Secondary | ICD-10-CM | POA: Diagnosis not present

## 2019-10-09 DIAGNOSIS — Z23 Encounter for immunization: Secondary | ICD-10-CM | POA: Diagnosis not present

## 2019-10-09 DIAGNOSIS — N186 End stage renal disease: Secondary | ICD-10-CM | POA: Diagnosis not present

## 2019-10-09 DIAGNOSIS — E1129 Type 2 diabetes mellitus with other diabetic kidney complication: Secondary | ICD-10-CM | POA: Diagnosis not present

## 2019-10-09 DIAGNOSIS — N2581 Secondary hyperparathyroidism of renal origin: Secondary | ICD-10-CM | POA: Diagnosis not present

## 2019-10-11 DIAGNOSIS — N2581 Secondary hyperparathyroidism of renal origin: Secondary | ICD-10-CM | POA: Diagnosis not present

## 2019-10-11 DIAGNOSIS — Z992 Dependence on renal dialysis: Secondary | ICD-10-CM | POA: Diagnosis not present

## 2019-10-11 DIAGNOSIS — D631 Anemia in chronic kidney disease: Secondary | ICD-10-CM | POA: Diagnosis not present

## 2019-10-11 DIAGNOSIS — D509 Iron deficiency anemia, unspecified: Secondary | ICD-10-CM | POA: Diagnosis not present

## 2019-10-11 DIAGNOSIS — E1129 Type 2 diabetes mellitus with other diabetic kidney complication: Secondary | ICD-10-CM | POA: Diagnosis not present

## 2019-10-11 DIAGNOSIS — N186 End stage renal disease: Secondary | ICD-10-CM | POA: Diagnosis not present

## 2019-10-13 DIAGNOSIS — D509 Iron deficiency anemia, unspecified: Secondary | ICD-10-CM | POA: Diagnosis not present

## 2019-10-13 DIAGNOSIS — E1129 Type 2 diabetes mellitus with other diabetic kidney complication: Secondary | ICD-10-CM | POA: Diagnosis not present

## 2019-10-13 DIAGNOSIS — D631 Anemia in chronic kidney disease: Secondary | ICD-10-CM | POA: Diagnosis not present

## 2019-10-13 DIAGNOSIS — N2581 Secondary hyperparathyroidism of renal origin: Secondary | ICD-10-CM | POA: Diagnosis not present

## 2019-10-13 DIAGNOSIS — Z992 Dependence on renal dialysis: Secondary | ICD-10-CM | POA: Diagnosis not present

## 2019-10-13 DIAGNOSIS — N186 End stage renal disease: Secondary | ICD-10-CM | POA: Diagnosis not present

## 2019-10-16 DIAGNOSIS — N186 End stage renal disease: Secondary | ICD-10-CM | POA: Diagnosis not present

## 2019-10-16 DIAGNOSIS — D509 Iron deficiency anemia, unspecified: Secondary | ICD-10-CM | POA: Diagnosis not present

## 2019-10-16 DIAGNOSIS — E1129 Type 2 diabetes mellitus with other diabetic kidney complication: Secondary | ICD-10-CM | POA: Diagnosis not present

## 2019-10-16 DIAGNOSIS — Z992 Dependence on renal dialysis: Secondary | ICD-10-CM | POA: Diagnosis not present

## 2019-10-16 DIAGNOSIS — D631 Anemia in chronic kidney disease: Secondary | ICD-10-CM | POA: Diagnosis not present

## 2019-10-16 DIAGNOSIS — N2581 Secondary hyperparathyroidism of renal origin: Secondary | ICD-10-CM | POA: Diagnosis not present

## 2019-10-18 DIAGNOSIS — Z992 Dependence on renal dialysis: Secondary | ICD-10-CM | POA: Diagnosis not present

## 2019-10-18 DIAGNOSIS — E1129 Type 2 diabetes mellitus with other diabetic kidney complication: Secondary | ICD-10-CM | POA: Diagnosis not present

## 2019-10-18 DIAGNOSIS — D509 Iron deficiency anemia, unspecified: Secondary | ICD-10-CM | POA: Diagnosis not present

## 2019-10-18 DIAGNOSIS — N2581 Secondary hyperparathyroidism of renal origin: Secondary | ICD-10-CM | POA: Diagnosis not present

## 2019-10-18 DIAGNOSIS — D631 Anemia in chronic kidney disease: Secondary | ICD-10-CM | POA: Diagnosis not present

## 2019-10-18 DIAGNOSIS — N186 End stage renal disease: Secondary | ICD-10-CM | POA: Diagnosis not present

## 2019-10-18 MED FILL — ISOSORBIDE MN ER 30 MG TAB: 30 | 30 days supply | Qty: 30 | Fill #0

## 2019-10-19 ENCOUNTER — Ambulatory Visit: Payer: Medicare Other | Admitting: Gastroenterology

## 2019-10-19 MED FILL — WARFARIN SODIUM 5 MG TABLET: 5 | 90 days supply | Qty: 102 | Fill #0

## 2019-10-20 DIAGNOSIS — D631 Anemia in chronic kidney disease: Secondary | ICD-10-CM | POA: Diagnosis not present

## 2019-10-20 DIAGNOSIS — Z992 Dependence on renal dialysis: Secondary | ICD-10-CM | POA: Diagnosis not present

## 2019-10-20 DIAGNOSIS — N2581 Secondary hyperparathyroidism of renal origin: Secondary | ICD-10-CM | POA: Diagnosis not present

## 2019-10-20 DIAGNOSIS — N186 End stage renal disease: Secondary | ICD-10-CM | POA: Diagnosis not present

## 2019-10-20 DIAGNOSIS — D509 Iron deficiency anemia, unspecified: Secondary | ICD-10-CM | POA: Diagnosis not present

## 2019-10-20 DIAGNOSIS — E1129 Type 2 diabetes mellitus with other diabetic kidney complication: Secondary | ICD-10-CM | POA: Diagnosis not present

## 2019-10-23 DIAGNOSIS — D631 Anemia in chronic kidney disease: Secondary | ICD-10-CM | POA: Diagnosis not present

## 2019-10-23 DIAGNOSIS — N2581 Secondary hyperparathyroidism of renal origin: Secondary | ICD-10-CM | POA: Diagnosis not present

## 2019-10-23 DIAGNOSIS — D509 Iron deficiency anemia, unspecified: Secondary | ICD-10-CM | POA: Diagnosis not present

## 2019-10-23 DIAGNOSIS — Z992 Dependence on renal dialysis: Secondary | ICD-10-CM | POA: Diagnosis not present

## 2019-10-23 DIAGNOSIS — E1129 Type 2 diabetes mellitus with other diabetic kidney complication: Secondary | ICD-10-CM | POA: Diagnosis not present

## 2019-10-23 DIAGNOSIS — N186 End stage renal disease: Secondary | ICD-10-CM | POA: Diagnosis not present

## 2019-10-25 DIAGNOSIS — E1129 Type 2 diabetes mellitus with other diabetic kidney complication: Secondary | ICD-10-CM | POA: Diagnosis not present

## 2019-10-25 DIAGNOSIS — D631 Anemia in chronic kidney disease: Secondary | ICD-10-CM | POA: Diagnosis not present

## 2019-10-25 DIAGNOSIS — N2581 Secondary hyperparathyroidism of renal origin: Secondary | ICD-10-CM | POA: Diagnosis not present

## 2019-10-25 DIAGNOSIS — N186 End stage renal disease: Secondary | ICD-10-CM | POA: Diagnosis not present

## 2019-10-25 DIAGNOSIS — Z992 Dependence on renal dialysis: Secondary | ICD-10-CM | POA: Diagnosis not present

## 2019-10-25 DIAGNOSIS — D509 Iron deficiency anemia, unspecified: Secondary | ICD-10-CM | POA: Diagnosis not present

## 2019-10-25 MED FILL — glipiZIDE 10 MG TABS: 10 | 90 days supply | Qty: 180 | Fill #0

## 2019-10-25 MED FILL — ISOSORBIDE DN 20 MG TABLET: 20 | 90 days supply | Qty: 270 | Fill #0

## 2019-10-25 MED FILL — CARVEDILOL 25 MG TABLET: 25 | 30 days supply | Qty: 60 | Fill #0

## 2019-10-25 MED FILL — ATORVASTATIN CALCIUM 40 MG: 40 | 90 days supply | Qty: 90 | Fill #0

## 2019-10-25 MED FILL — hydrALAZINE HCL 10 MG TABS: 10 | 30 days supply | Qty: 90 | Fill #0

## 2019-10-27 DIAGNOSIS — Z992 Dependence on renal dialysis: Secondary | ICD-10-CM | POA: Diagnosis not present

## 2019-10-27 DIAGNOSIS — N2581 Secondary hyperparathyroidism of renal origin: Secondary | ICD-10-CM | POA: Diagnosis not present

## 2019-10-27 DIAGNOSIS — E1129 Type 2 diabetes mellitus with other diabetic kidney complication: Secondary | ICD-10-CM | POA: Diagnosis not present

## 2019-10-27 DIAGNOSIS — D509 Iron deficiency anemia, unspecified: Secondary | ICD-10-CM | POA: Diagnosis not present

## 2019-10-27 DIAGNOSIS — D631 Anemia in chronic kidney disease: Secondary | ICD-10-CM | POA: Diagnosis not present

## 2019-10-27 DIAGNOSIS — E8770 Fluid overload, unspecified: Secondary | ICD-10-CM | POA: Insufficient documentation

## 2019-10-27 DIAGNOSIS — N186 End stage renal disease: Secondary | ICD-10-CM | POA: Diagnosis not present

## 2019-10-28 DIAGNOSIS — E1129 Type 2 diabetes mellitus with other diabetic kidney complication: Secondary | ICD-10-CM | POA: Diagnosis not present

## 2019-10-28 DIAGNOSIS — D509 Iron deficiency anemia, unspecified: Secondary | ICD-10-CM | POA: Diagnosis not present

## 2019-10-28 DIAGNOSIS — N2581 Secondary hyperparathyroidism of renal origin: Secondary | ICD-10-CM | POA: Diagnosis not present

## 2019-10-28 DIAGNOSIS — N186 End stage renal disease: Secondary | ICD-10-CM | POA: Diagnosis not present

## 2019-10-28 DIAGNOSIS — D631 Anemia in chronic kidney disease: Secondary | ICD-10-CM | POA: Diagnosis not present

## 2019-10-28 DIAGNOSIS — Z992 Dependence on renal dialysis: Secondary | ICD-10-CM | POA: Diagnosis not present

## 2019-10-30 DIAGNOSIS — Z992 Dependence on renal dialysis: Secondary | ICD-10-CM | POA: Diagnosis not present

## 2019-10-30 DIAGNOSIS — N186 End stage renal disease: Secondary | ICD-10-CM | POA: Diagnosis not present

## 2019-10-30 DIAGNOSIS — E1129 Type 2 diabetes mellitus with other diabetic kidney complication: Secondary | ICD-10-CM | POA: Diagnosis not present

## 2019-10-30 DIAGNOSIS — D509 Iron deficiency anemia, unspecified: Secondary | ICD-10-CM | POA: Diagnosis not present

## 2019-10-30 DIAGNOSIS — D631 Anemia in chronic kidney disease: Secondary | ICD-10-CM | POA: Diagnosis not present

## 2019-10-30 DIAGNOSIS — N2581 Secondary hyperparathyroidism of renal origin: Secondary | ICD-10-CM | POA: Diagnosis not present

## 2019-11-01 DIAGNOSIS — E1129 Type 2 diabetes mellitus with other diabetic kidney complication: Secondary | ICD-10-CM | POA: Diagnosis not present

## 2019-11-01 DIAGNOSIS — D509 Iron deficiency anemia, unspecified: Secondary | ICD-10-CM | POA: Diagnosis not present

## 2019-11-01 DIAGNOSIS — N186 End stage renal disease: Secondary | ICD-10-CM | POA: Diagnosis not present

## 2019-11-01 DIAGNOSIS — N2581 Secondary hyperparathyroidism of renal origin: Secondary | ICD-10-CM | POA: Diagnosis not present

## 2019-11-01 DIAGNOSIS — D631 Anemia in chronic kidney disease: Secondary | ICD-10-CM | POA: Diagnosis not present

## 2019-11-01 DIAGNOSIS — Z992 Dependence on renal dialysis: Secondary | ICD-10-CM | POA: Diagnosis not present

## 2019-11-03 DIAGNOSIS — E1129 Type 2 diabetes mellitus with other diabetic kidney complication: Secondary | ICD-10-CM | POA: Diagnosis not present

## 2019-11-03 DIAGNOSIS — D631 Anemia in chronic kidney disease: Secondary | ICD-10-CM | POA: Diagnosis not present

## 2019-11-03 DIAGNOSIS — D509 Iron deficiency anemia, unspecified: Secondary | ICD-10-CM | POA: Diagnosis not present

## 2019-11-03 DIAGNOSIS — Z992 Dependence on renal dialysis: Secondary | ICD-10-CM | POA: Diagnosis not present

## 2019-11-03 DIAGNOSIS — N2581 Secondary hyperparathyroidism of renal origin: Secondary | ICD-10-CM | POA: Diagnosis not present

## 2019-11-03 DIAGNOSIS — N186 End stage renal disease: Secondary | ICD-10-CM | POA: Diagnosis not present

## 2019-11-06 DIAGNOSIS — D631 Anemia in chronic kidney disease: Secondary | ICD-10-CM | POA: Diagnosis not present

## 2019-11-06 DIAGNOSIS — N186 End stage renal disease: Secondary | ICD-10-CM | POA: Diagnosis not present

## 2019-11-06 DIAGNOSIS — N2581 Secondary hyperparathyroidism of renal origin: Secondary | ICD-10-CM | POA: Diagnosis not present

## 2019-11-06 DIAGNOSIS — Z992 Dependence on renal dialysis: Secondary | ICD-10-CM | POA: Diagnosis not present

## 2019-11-06 DIAGNOSIS — E1129 Type 2 diabetes mellitus with other diabetic kidney complication: Secondary | ICD-10-CM | POA: Diagnosis not present

## 2019-11-06 DIAGNOSIS — D509 Iron deficiency anemia, unspecified: Secondary | ICD-10-CM | POA: Diagnosis not present

## 2019-11-08 DIAGNOSIS — N186 End stage renal disease: Secondary | ICD-10-CM | POA: Diagnosis not present

## 2019-11-08 DIAGNOSIS — Z992 Dependence on renal dialysis: Secondary | ICD-10-CM | POA: Diagnosis not present

## 2019-11-08 DIAGNOSIS — D631 Anemia in chronic kidney disease: Secondary | ICD-10-CM | POA: Diagnosis not present

## 2019-11-08 DIAGNOSIS — E1129 Type 2 diabetes mellitus with other diabetic kidney complication: Secondary | ICD-10-CM | POA: Diagnosis not present

## 2019-11-08 DIAGNOSIS — N2581 Secondary hyperparathyroidism of renal origin: Secondary | ICD-10-CM | POA: Diagnosis not present

## 2019-11-08 DIAGNOSIS — D509 Iron deficiency anemia, unspecified: Secondary | ICD-10-CM | POA: Diagnosis not present

## 2019-11-09 DIAGNOSIS — E1122 Type 2 diabetes mellitus with diabetic chronic kidney disease: Secondary | ICD-10-CM | POA: Diagnosis not present

## 2019-11-09 DIAGNOSIS — N186 End stage renal disease: Secondary | ICD-10-CM | POA: Diagnosis not present

## 2019-11-09 DIAGNOSIS — Z992 Dependence on renal dialysis: Secondary | ICD-10-CM | POA: Diagnosis not present

## 2019-11-10 DIAGNOSIS — N186 End stage renal disease: Secondary | ICD-10-CM | POA: Diagnosis not present

## 2019-11-10 DIAGNOSIS — Z23 Encounter for immunization: Secondary | ICD-10-CM | POA: Diagnosis not present

## 2019-11-10 DIAGNOSIS — E1129 Type 2 diabetes mellitus with other diabetic kidney complication: Secondary | ICD-10-CM | POA: Diagnosis not present

## 2019-11-10 DIAGNOSIS — Z992 Dependence on renal dialysis: Secondary | ICD-10-CM | POA: Diagnosis not present

## 2019-11-10 DIAGNOSIS — D509 Iron deficiency anemia, unspecified: Secondary | ICD-10-CM | POA: Diagnosis not present

## 2019-11-10 DIAGNOSIS — D631 Anemia in chronic kidney disease: Secondary | ICD-10-CM | POA: Diagnosis not present

## 2019-11-10 DIAGNOSIS — N2581 Secondary hyperparathyroidism of renal origin: Secondary | ICD-10-CM | POA: Diagnosis not present

## 2019-11-13 DIAGNOSIS — D631 Anemia in chronic kidney disease: Secondary | ICD-10-CM | POA: Diagnosis not present

## 2019-11-13 DIAGNOSIS — N2581 Secondary hyperparathyroidism of renal origin: Secondary | ICD-10-CM | POA: Diagnosis not present

## 2019-11-13 DIAGNOSIS — D509 Iron deficiency anemia, unspecified: Secondary | ICD-10-CM | POA: Diagnosis not present

## 2019-11-13 DIAGNOSIS — N186 End stage renal disease: Secondary | ICD-10-CM | POA: Diagnosis not present

## 2019-11-13 DIAGNOSIS — Z992 Dependence on renal dialysis: Secondary | ICD-10-CM | POA: Diagnosis not present

## 2019-11-13 DIAGNOSIS — E1129 Type 2 diabetes mellitus with other diabetic kidney complication: Secondary | ICD-10-CM | POA: Diagnosis not present

## 2019-11-14 ENCOUNTER — Ambulatory Visit: Payer: Medicare Other | Admitting: Family Medicine

## 2019-11-15 DIAGNOSIS — Z992 Dependence on renal dialysis: Secondary | ICD-10-CM | POA: Diagnosis not present

## 2019-11-15 DIAGNOSIS — N2581 Secondary hyperparathyroidism of renal origin: Secondary | ICD-10-CM | POA: Diagnosis not present

## 2019-11-15 DIAGNOSIS — E1129 Type 2 diabetes mellitus with other diabetic kidney complication: Secondary | ICD-10-CM | POA: Diagnosis not present

## 2019-11-15 DIAGNOSIS — D509 Iron deficiency anemia, unspecified: Secondary | ICD-10-CM | POA: Diagnosis not present

## 2019-11-15 DIAGNOSIS — N186 End stage renal disease: Secondary | ICD-10-CM | POA: Diagnosis not present

## 2019-11-15 DIAGNOSIS — D631 Anemia in chronic kidney disease: Secondary | ICD-10-CM | POA: Diagnosis not present

## 2019-11-17 DIAGNOSIS — N186 End stage renal disease: Secondary | ICD-10-CM | POA: Diagnosis not present

## 2019-11-17 DIAGNOSIS — E1129 Type 2 diabetes mellitus with other diabetic kidney complication: Secondary | ICD-10-CM | POA: Diagnosis not present

## 2019-11-17 DIAGNOSIS — D631 Anemia in chronic kidney disease: Secondary | ICD-10-CM | POA: Diagnosis not present

## 2019-11-17 DIAGNOSIS — N2581 Secondary hyperparathyroidism of renal origin: Secondary | ICD-10-CM | POA: Diagnosis not present

## 2019-11-17 DIAGNOSIS — D509 Iron deficiency anemia, unspecified: Secondary | ICD-10-CM | POA: Diagnosis not present

## 2019-11-17 DIAGNOSIS — Z992 Dependence on renal dialysis: Secondary | ICD-10-CM | POA: Diagnosis not present

## 2019-11-20 DIAGNOSIS — Z992 Dependence on renal dialysis: Secondary | ICD-10-CM | POA: Diagnosis not present

## 2019-11-20 DIAGNOSIS — D631 Anemia in chronic kidney disease: Secondary | ICD-10-CM | POA: Diagnosis not present

## 2019-11-20 DIAGNOSIS — N186 End stage renal disease: Secondary | ICD-10-CM | POA: Diagnosis not present

## 2019-11-20 DIAGNOSIS — E1129 Type 2 diabetes mellitus with other diabetic kidney complication: Secondary | ICD-10-CM | POA: Diagnosis not present

## 2019-11-20 DIAGNOSIS — N2581 Secondary hyperparathyroidism of renal origin: Secondary | ICD-10-CM | POA: Diagnosis not present

## 2019-11-20 DIAGNOSIS — D509 Iron deficiency anemia, unspecified: Secondary | ICD-10-CM | POA: Diagnosis not present

## 2019-11-22 DIAGNOSIS — E1129 Type 2 diabetes mellitus with other diabetic kidney complication: Secondary | ICD-10-CM | POA: Diagnosis not present

## 2019-11-22 DIAGNOSIS — D631 Anemia in chronic kidney disease: Secondary | ICD-10-CM | POA: Diagnosis not present

## 2019-11-22 DIAGNOSIS — N186 End stage renal disease: Secondary | ICD-10-CM | POA: Diagnosis not present

## 2019-11-22 DIAGNOSIS — D509 Iron deficiency anemia, unspecified: Secondary | ICD-10-CM | POA: Diagnosis not present

## 2019-11-22 DIAGNOSIS — N2581 Secondary hyperparathyroidism of renal origin: Secondary | ICD-10-CM | POA: Diagnosis not present

## 2019-11-22 DIAGNOSIS — Z992 Dependence on renal dialysis: Secondary | ICD-10-CM | POA: Diagnosis not present

## 2019-11-24 DIAGNOSIS — N2581 Secondary hyperparathyroidism of renal origin: Secondary | ICD-10-CM | POA: Diagnosis not present

## 2019-11-24 DIAGNOSIS — N186 End stage renal disease: Secondary | ICD-10-CM | POA: Diagnosis not present

## 2019-11-24 DIAGNOSIS — D509 Iron deficiency anemia, unspecified: Secondary | ICD-10-CM | POA: Diagnosis not present

## 2019-11-24 DIAGNOSIS — Z992 Dependence on renal dialysis: Secondary | ICD-10-CM | POA: Diagnosis not present

## 2019-11-24 DIAGNOSIS — D631 Anemia in chronic kidney disease: Secondary | ICD-10-CM | POA: Diagnosis not present

## 2019-11-24 DIAGNOSIS — E1129 Type 2 diabetes mellitus with other diabetic kidney complication: Secondary | ICD-10-CM | POA: Diagnosis not present

## 2019-11-27 DIAGNOSIS — N186 End stage renal disease: Secondary | ICD-10-CM | POA: Diagnosis not present

## 2019-11-27 DIAGNOSIS — E1129 Type 2 diabetes mellitus with other diabetic kidney complication: Secondary | ICD-10-CM | POA: Diagnosis not present

## 2019-11-27 DIAGNOSIS — D509 Iron deficiency anemia, unspecified: Secondary | ICD-10-CM | POA: Diagnosis not present

## 2019-11-27 DIAGNOSIS — N2581 Secondary hyperparathyroidism of renal origin: Secondary | ICD-10-CM | POA: Diagnosis not present

## 2019-11-27 DIAGNOSIS — D631 Anemia in chronic kidney disease: Secondary | ICD-10-CM | POA: Diagnosis not present

## 2019-11-27 DIAGNOSIS — Z992 Dependence on renal dialysis: Secondary | ICD-10-CM | POA: Diagnosis not present

## 2019-11-29 DIAGNOSIS — D631 Anemia in chronic kidney disease: Secondary | ICD-10-CM | POA: Diagnosis not present

## 2019-11-29 DIAGNOSIS — N186 End stage renal disease: Secondary | ICD-10-CM | POA: Diagnosis not present

## 2019-11-29 DIAGNOSIS — N2581 Secondary hyperparathyroidism of renal origin: Secondary | ICD-10-CM | POA: Diagnosis not present

## 2019-11-29 DIAGNOSIS — D509 Iron deficiency anemia, unspecified: Secondary | ICD-10-CM | POA: Diagnosis not present

## 2019-11-29 DIAGNOSIS — Z992 Dependence on renal dialysis: Secondary | ICD-10-CM | POA: Diagnosis not present

## 2019-11-29 DIAGNOSIS — E1129 Type 2 diabetes mellitus with other diabetic kidney complication: Secondary | ICD-10-CM | POA: Diagnosis not present

## 2019-12-01 DIAGNOSIS — N186 End stage renal disease: Secondary | ICD-10-CM | POA: Diagnosis not present

## 2019-12-01 DIAGNOSIS — N2581 Secondary hyperparathyroidism of renal origin: Secondary | ICD-10-CM | POA: Diagnosis not present

## 2019-12-01 DIAGNOSIS — Z992 Dependence on renal dialysis: Secondary | ICD-10-CM | POA: Diagnosis not present

## 2019-12-01 DIAGNOSIS — D509 Iron deficiency anemia, unspecified: Secondary | ICD-10-CM | POA: Diagnosis not present

## 2019-12-01 DIAGNOSIS — D631 Anemia in chronic kidney disease: Secondary | ICD-10-CM | POA: Diagnosis not present

## 2019-12-01 DIAGNOSIS — E1129 Type 2 diabetes mellitus with other diabetic kidney complication: Secondary | ICD-10-CM | POA: Diagnosis not present

## 2019-12-04 DIAGNOSIS — N2581 Secondary hyperparathyroidism of renal origin: Secondary | ICD-10-CM | POA: Diagnosis not present

## 2019-12-04 DIAGNOSIS — E1129 Type 2 diabetes mellitus with other diabetic kidney complication: Secondary | ICD-10-CM | POA: Diagnosis not present

## 2019-12-04 DIAGNOSIS — D509 Iron deficiency anemia, unspecified: Secondary | ICD-10-CM | POA: Diagnosis not present

## 2019-12-04 DIAGNOSIS — D631 Anemia in chronic kidney disease: Secondary | ICD-10-CM | POA: Diagnosis not present

## 2019-12-04 DIAGNOSIS — N186 End stage renal disease: Secondary | ICD-10-CM | POA: Diagnosis not present

## 2019-12-04 DIAGNOSIS — Z992 Dependence on renal dialysis: Secondary | ICD-10-CM | POA: Diagnosis not present

## 2019-12-06 DIAGNOSIS — Z992 Dependence on renal dialysis: Secondary | ICD-10-CM | POA: Diagnosis not present

## 2019-12-06 DIAGNOSIS — D631 Anemia in chronic kidney disease: Secondary | ICD-10-CM | POA: Diagnosis not present

## 2019-12-06 DIAGNOSIS — N2581 Secondary hyperparathyroidism of renal origin: Secondary | ICD-10-CM | POA: Diagnosis not present

## 2019-12-06 DIAGNOSIS — D509 Iron deficiency anemia, unspecified: Secondary | ICD-10-CM | POA: Diagnosis not present

## 2019-12-06 DIAGNOSIS — N186 End stage renal disease: Secondary | ICD-10-CM | POA: Diagnosis not present

## 2019-12-06 DIAGNOSIS — E1129 Type 2 diabetes mellitus with other diabetic kidney complication: Secondary | ICD-10-CM | POA: Diagnosis not present

## 2019-12-08 DIAGNOSIS — E1129 Type 2 diabetes mellitus with other diabetic kidney complication: Secondary | ICD-10-CM | POA: Diagnosis not present

## 2019-12-08 DIAGNOSIS — D631 Anemia in chronic kidney disease: Secondary | ICD-10-CM | POA: Diagnosis not present

## 2019-12-08 DIAGNOSIS — N2581 Secondary hyperparathyroidism of renal origin: Secondary | ICD-10-CM | POA: Diagnosis not present

## 2019-12-08 DIAGNOSIS — D509 Iron deficiency anemia, unspecified: Secondary | ICD-10-CM | POA: Diagnosis not present

## 2019-12-08 DIAGNOSIS — Z992 Dependence on renal dialysis: Secondary | ICD-10-CM | POA: Diagnosis not present

## 2019-12-08 DIAGNOSIS — N186 End stage renal disease: Secondary | ICD-10-CM | POA: Diagnosis not present

## 2019-12-09 DIAGNOSIS — E1122 Type 2 diabetes mellitus with diabetic chronic kidney disease: Secondary | ICD-10-CM | POA: Diagnosis not present

## 2019-12-09 DIAGNOSIS — Z992 Dependence on renal dialysis: Secondary | ICD-10-CM | POA: Diagnosis not present

## 2019-12-09 DIAGNOSIS — N186 End stage renal disease: Secondary | ICD-10-CM | POA: Diagnosis not present

## 2019-12-11 DIAGNOSIS — N2581 Secondary hyperparathyroidism of renal origin: Secondary | ICD-10-CM | POA: Diagnosis not present

## 2019-12-11 DIAGNOSIS — D631 Anemia in chronic kidney disease: Secondary | ICD-10-CM | POA: Diagnosis not present

## 2019-12-11 DIAGNOSIS — D509 Iron deficiency anemia, unspecified: Secondary | ICD-10-CM | POA: Diagnosis not present

## 2019-12-11 DIAGNOSIS — Z992 Dependence on renal dialysis: Secondary | ICD-10-CM | POA: Diagnosis not present

## 2019-12-11 DIAGNOSIS — E1129 Type 2 diabetes mellitus with other diabetic kidney complication: Secondary | ICD-10-CM | POA: Diagnosis not present

## 2019-12-11 DIAGNOSIS — N186 End stage renal disease: Secondary | ICD-10-CM | POA: Diagnosis not present

## 2019-12-13 DIAGNOSIS — N186 End stage renal disease: Secondary | ICD-10-CM | POA: Diagnosis not present

## 2019-12-13 DIAGNOSIS — D631 Anemia in chronic kidney disease: Secondary | ICD-10-CM | POA: Diagnosis not present

## 2019-12-13 DIAGNOSIS — N2581 Secondary hyperparathyroidism of renal origin: Secondary | ICD-10-CM | POA: Diagnosis not present

## 2019-12-13 DIAGNOSIS — D509 Iron deficiency anemia, unspecified: Secondary | ICD-10-CM | POA: Diagnosis not present

## 2019-12-13 DIAGNOSIS — E1129 Type 2 diabetes mellitus with other diabetic kidney complication: Secondary | ICD-10-CM | POA: Diagnosis not present

## 2019-12-13 DIAGNOSIS — Z992 Dependence on renal dialysis: Secondary | ICD-10-CM | POA: Diagnosis not present

## 2019-12-15 DIAGNOSIS — N2581 Secondary hyperparathyroidism of renal origin: Secondary | ICD-10-CM | POA: Diagnosis not present

## 2019-12-15 DIAGNOSIS — D509 Iron deficiency anemia, unspecified: Secondary | ICD-10-CM | POA: Diagnosis not present

## 2019-12-15 DIAGNOSIS — D631 Anemia in chronic kidney disease: Secondary | ICD-10-CM | POA: Diagnosis not present

## 2019-12-15 DIAGNOSIS — N186 End stage renal disease: Secondary | ICD-10-CM | POA: Diagnosis not present

## 2019-12-15 DIAGNOSIS — E1129 Type 2 diabetes mellitus with other diabetic kidney complication: Secondary | ICD-10-CM | POA: Diagnosis not present

## 2019-12-15 DIAGNOSIS — Z992 Dependence on renal dialysis: Secondary | ICD-10-CM | POA: Diagnosis not present

## 2019-12-18 DIAGNOSIS — N186 End stage renal disease: Secondary | ICD-10-CM | POA: Diagnosis not present

## 2019-12-18 DIAGNOSIS — Z992 Dependence on renal dialysis: Secondary | ICD-10-CM | POA: Diagnosis not present

## 2019-12-18 DIAGNOSIS — D631 Anemia in chronic kidney disease: Secondary | ICD-10-CM | POA: Diagnosis not present

## 2019-12-18 DIAGNOSIS — D509 Iron deficiency anemia, unspecified: Secondary | ICD-10-CM | POA: Diagnosis not present

## 2019-12-18 DIAGNOSIS — E1129 Type 2 diabetes mellitus with other diabetic kidney complication: Secondary | ICD-10-CM | POA: Diagnosis not present

## 2019-12-18 DIAGNOSIS — N2581 Secondary hyperparathyroidism of renal origin: Secondary | ICD-10-CM | POA: Diagnosis not present

## 2019-12-20 DIAGNOSIS — Z992 Dependence on renal dialysis: Secondary | ICD-10-CM | POA: Diagnosis not present

## 2019-12-20 DIAGNOSIS — D631 Anemia in chronic kidney disease: Secondary | ICD-10-CM | POA: Diagnosis not present

## 2019-12-20 DIAGNOSIS — E1129 Type 2 diabetes mellitus with other diabetic kidney complication: Secondary | ICD-10-CM | POA: Diagnosis not present

## 2019-12-20 DIAGNOSIS — N186 End stage renal disease: Secondary | ICD-10-CM | POA: Diagnosis not present

## 2019-12-20 DIAGNOSIS — N2581 Secondary hyperparathyroidism of renal origin: Secondary | ICD-10-CM | POA: Diagnosis not present

## 2019-12-20 DIAGNOSIS — D509 Iron deficiency anemia, unspecified: Secondary | ICD-10-CM | POA: Diagnosis not present

## 2019-12-22 DIAGNOSIS — E1129 Type 2 diabetes mellitus with other diabetic kidney complication: Secondary | ICD-10-CM | POA: Diagnosis not present

## 2019-12-22 DIAGNOSIS — D631 Anemia in chronic kidney disease: Secondary | ICD-10-CM | POA: Diagnosis not present

## 2019-12-22 DIAGNOSIS — N2581 Secondary hyperparathyroidism of renal origin: Secondary | ICD-10-CM | POA: Diagnosis not present

## 2019-12-22 DIAGNOSIS — N186 End stage renal disease: Secondary | ICD-10-CM | POA: Diagnosis not present

## 2019-12-22 DIAGNOSIS — D509 Iron deficiency anemia, unspecified: Secondary | ICD-10-CM | POA: Diagnosis not present

## 2019-12-22 DIAGNOSIS — Z992 Dependence on renal dialysis: Secondary | ICD-10-CM | POA: Diagnosis not present

## 2019-12-25 DIAGNOSIS — D631 Anemia in chronic kidney disease: Secondary | ICD-10-CM | POA: Diagnosis not present

## 2019-12-25 DIAGNOSIS — D509 Iron deficiency anemia, unspecified: Secondary | ICD-10-CM | POA: Diagnosis not present

## 2019-12-25 DIAGNOSIS — E1129 Type 2 diabetes mellitus with other diabetic kidney complication: Secondary | ICD-10-CM | POA: Diagnosis not present

## 2019-12-25 DIAGNOSIS — N186 End stage renal disease: Secondary | ICD-10-CM | POA: Diagnosis not present

## 2019-12-25 DIAGNOSIS — N2581 Secondary hyperparathyroidism of renal origin: Secondary | ICD-10-CM | POA: Diagnosis not present

## 2019-12-25 DIAGNOSIS — Z992 Dependence on renal dialysis: Secondary | ICD-10-CM | POA: Diagnosis not present

## 2019-12-27 DIAGNOSIS — N186 End stage renal disease: Secondary | ICD-10-CM | POA: Diagnosis not present

## 2019-12-27 DIAGNOSIS — N2581 Secondary hyperparathyroidism of renal origin: Secondary | ICD-10-CM | POA: Diagnosis not present

## 2019-12-27 DIAGNOSIS — E1129 Type 2 diabetes mellitus with other diabetic kidney complication: Secondary | ICD-10-CM | POA: Diagnosis not present

## 2019-12-27 DIAGNOSIS — D509 Iron deficiency anemia, unspecified: Secondary | ICD-10-CM | POA: Diagnosis not present

## 2019-12-27 DIAGNOSIS — D631 Anemia in chronic kidney disease: Secondary | ICD-10-CM | POA: Diagnosis not present

## 2019-12-27 DIAGNOSIS — Z992 Dependence on renal dialysis: Secondary | ICD-10-CM | POA: Diagnosis not present

## 2019-12-27 MED FILL — AMLODIPINE BESYLATE 10 MG T: 10 | 90 days supply | Qty: 90 | Fill #1 | Status: TO

## 2019-12-27 MED FILL — CARVEDILOL 25 MG TABLET: 25 | 30 days supply | Qty: 60 | Fill #1

## 2019-12-27 MED FILL — hydrALAZINE HCL 10 MG TABS: 10 | 30 days supply | Qty: 90 | Fill #1

## 2019-12-28 MED FILL — AURYXIA 210 MG TABLET: 1 GM | 22 days supply | Qty: 200 | Fill #0

## 2019-12-29 DIAGNOSIS — N186 End stage renal disease: Secondary | ICD-10-CM | POA: Diagnosis not present

## 2019-12-29 DIAGNOSIS — E1129 Type 2 diabetes mellitus with other diabetic kidney complication: Secondary | ICD-10-CM | POA: Diagnosis not present

## 2019-12-29 DIAGNOSIS — D631 Anemia in chronic kidney disease: Secondary | ICD-10-CM | POA: Diagnosis not present

## 2019-12-29 DIAGNOSIS — Z992 Dependence on renal dialysis: Secondary | ICD-10-CM | POA: Diagnosis not present

## 2019-12-29 DIAGNOSIS — D509 Iron deficiency anemia, unspecified: Secondary | ICD-10-CM | POA: Diagnosis not present

## 2019-12-29 DIAGNOSIS — N2581 Secondary hyperparathyroidism of renal origin: Secondary | ICD-10-CM | POA: Diagnosis not present

## 2020-01-01 DIAGNOSIS — N2581 Secondary hyperparathyroidism of renal origin: Secondary | ICD-10-CM | POA: Diagnosis not present

## 2020-01-01 DIAGNOSIS — E1129 Type 2 diabetes mellitus with other diabetic kidney complication: Secondary | ICD-10-CM | POA: Diagnosis not present

## 2020-01-01 DIAGNOSIS — D509 Iron deficiency anemia, unspecified: Secondary | ICD-10-CM | POA: Diagnosis not present

## 2020-01-01 DIAGNOSIS — N186 End stage renal disease: Secondary | ICD-10-CM | POA: Diagnosis not present

## 2020-01-01 DIAGNOSIS — Z992 Dependence on renal dialysis: Secondary | ICD-10-CM | POA: Diagnosis not present

## 2020-01-01 DIAGNOSIS — D631 Anemia in chronic kidney disease: Secondary | ICD-10-CM | POA: Diagnosis not present

## 2020-01-03 DIAGNOSIS — N2581 Secondary hyperparathyroidism of renal origin: Secondary | ICD-10-CM | POA: Diagnosis not present

## 2020-01-03 DIAGNOSIS — Z992 Dependence on renal dialysis: Secondary | ICD-10-CM | POA: Diagnosis not present

## 2020-01-03 DIAGNOSIS — D631 Anemia in chronic kidney disease: Secondary | ICD-10-CM | POA: Diagnosis not present

## 2020-01-03 DIAGNOSIS — N186 End stage renal disease: Secondary | ICD-10-CM | POA: Diagnosis not present

## 2020-01-03 DIAGNOSIS — E1129 Type 2 diabetes mellitus with other diabetic kidney complication: Secondary | ICD-10-CM | POA: Diagnosis not present

## 2020-01-03 DIAGNOSIS — D509 Iron deficiency anemia, unspecified: Secondary | ICD-10-CM | POA: Diagnosis not present

## 2020-01-05 DIAGNOSIS — E1129 Type 2 diabetes mellitus with other diabetic kidney complication: Secondary | ICD-10-CM | POA: Diagnosis not present

## 2020-01-05 DIAGNOSIS — D631 Anemia in chronic kidney disease: Secondary | ICD-10-CM | POA: Diagnosis not present

## 2020-01-05 DIAGNOSIS — D509 Iron deficiency anemia, unspecified: Secondary | ICD-10-CM | POA: Diagnosis not present

## 2020-01-05 DIAGNOSIS — Z992 Dependence on renal dialysis: Secondary | ICD-10-CM | POA: Diagnosis not present

## 2020-01-05 DIAGNOSIS — N186 End stage renal disease: Secondary | ICD-10-CM | POA: Diagnosis not present

## 2020-01-05 DIAGNOSIS — N2581 Secondary hyperparathyroidism of renal origin: Secondary | ICD-10-CM | POA: Diagnosis not present

## 2020-01-08 DIAGNOSIS — Z992 Dependence on renal dialysis: Secondary | ICD-10-CM | POA: Diagnosis not present

## 2020-01-08 DIAGNOSIS — D509 Iron deficiency anemia, unspecified: Secondary | ICD-10-CM | POA: Diagnosis not present

## 2020-01-08 DIAGNOSIS — N2581 Secondary hyperparathyroidism of renal origin: Secondary | ICD-10-CM | POA: Diagnosis not present

## 2020-01-08 DIAGNOSIS — N186 End stage renal disease: Secondary | ICD-10-CM | POA: Diagnosis not present

## 2020-01-08 DIAGNOSIS — E1129 Type 2 diabetes mellitus with other diabetic kidney complication: Secondary | ICD-10-CM | POA: Diagnosis not present

## 2020-01-08 DIAGNOSIS — D631 Anemia in chronic kidney disease: Secondary | ICD-10-CM | POA: Diagnosis not present

## 2020-01-11 DIAGNOSIS — I871 Compression of vein: Secondary | ICD-10-CM | POA: Diagnosis not present

## 2020-01-11 DIAGNOSIS — Z992 Dependence on renal dialysis: Secondary | ICD-10-CM | POA: Diagnosis not present

## 2020-01-11 DIAGNOSIS — N186 End stage renal disease: Secondary | ICD-10-CM | POA: Diagnosis not present

## 2020-01-12 DIAGNOSIS — Z4802 Encounter for removal of sutures: Secondary | ICD-10-CM | POA: Insufficient documentation

## 2020-02-05 MED FILL — CARVEDILOL 25 MG TABLET: 25 | 30 days supply | Qty: 60 | Fill #2

## 2020-02-08 DIAGNOSIS — E1122 Type 2 diabetes mellitus with diabetic chronic kidney disease: Secondary | ICD-10-CM | POA: Diagnosis not present

## 2020-02-08 DIAGNOSIS — Z992 Dependence on renal dialysis: Secondary | ICD-10-CM | POA: Diagnosis not present

## 2020-02-08 DIAGNOSIS — N186 End stage renal disease: Secondary | ICD-10-CM | POA: Diagnosis not present

## 2020-02-09 DIAGNOSIS — Z992 Dependence on renal dialysis: Secondary | ICD-10-CM | POA: Diagnosis not present

## 2020-02-09 DIAGNOSIS — E1129 Type 2 diabetes mellitus with other diabetic kidney complication: Secondary | ICD-10-CM | POA: Diagnosis not present

## 2020-02-09 DIAGNOSIS — N2581 Secondary hyperparathyroidism of renal origin: Secondary | ICD-10-CM | POA: Diagnosis not present

## 2020-02-09 DIAGNOSIS — N186 End stage renal disease: Secondary | ICD-10-CM | POA: Diagnosis not present

## 2020-02-12 DIAGNOSIS — N2581 Secondary hyperparathyroidism of renal origin: Secondary | ICD-10-CM | POA: Diagnosis not present

## 2020-02-12 DIAGNOSIS — E1129 Type 2 diabetes mellitus with other diabetic kidney complication: Secondary | ICD-10-CM | POA: Diagnosis not present

## 2020-02-12 DIAGNOSIS — N186 End stage renal disease: Secondary | ICD-10-CM | POA: Diagnosis not present

## 2020-02-12 DIAGNOSIS — Z992 Dependence on renal dialysis: Secondary | ICD-10-CM | POA: Diagnosis not present

## 2020-02-14 DIAGNOSIS — Z992 Dependence on renal dialysis: Secondary | ICD-10-CM | POA: Diagnosis not present

## 2020-02-14 DIAGNOSIS — N2581 Secondary hyperparathyroidism of renal origin: Secondary | ICD-10-CM | POA: Diagnosis not present

## 2020-02-14 DIAGNOSIS — N186 End stage renal disease: Secondary | ICD-10-CM | POA: Diagnosis not present

## 2020-02-14 DIAGNOSIS — E1129 Type 2 diabetes mellitus with other diabetic kidney complication: Secondary | ICD-10-CM | POA: Diagnosis not present

## 2020-02-16 DIAGNOSIS — N2581 Secondary hyperparathyroidism of renal origin: Secondary | ICD-10-CM | POA: Diagnosis not present

## 2020-02-16 DIAGNOSIS — Z992 Dependence on renal dialysis: Secondary | ICD-10-CM | POA: Diagnosis not present

## 2020-02-16 DIAGNOSIS — N186 End stage renal disease: Secondary | ICD-10-CM | POA: Diagnosis not present

## 2020-02-16 DIAGNOSIS — E1129 Type 2 diabetes mellitus with other diabetic kidney complication: Secondary | ICD-10-CM | POA: Diagnosis not present

## 2020-02-19 DIAGNOSIS — E1129 Type 2 diabetes mellitus with other diabetic kidney complication: Secondary | ICD-10-CM | POA: Diagnosis not present

## 2020-02-19 DIAGNOSIS — N186 End stage renal disease: Secondary | ICD-10-CM | POA: Diagnosis not present

## 2020-02-19 DIAGNOSIS — N2581 Secondary hyperparathyroidism of renal origin: Secondary | ICD-10-CM | POA: Diagnosis not present

## 2020-02-19 DIAGNOSIS — Z992 Dependence on renal dialysis: Secondary | ICD-10-CM | POA: Diagnosis not present

## 2020-02-21 DIAGNOSIS — E1129 Type 2 diabetes mellitus with other diabetic kidney complication: Secondary | ICD-10-CM | POA: Diagnosis not present

## 2020-02-21 DIAGNOSIS — N2581 Secondary hyperparathyroidism of renal origin: Secondary | ICD-10-CM | POA: Diagnosis not present

## 2020-02-21 DIAGNOSIS — N186 End stage renal disease: Secondary | ICD-10-CM | POA: Diagnosis not present

## 2020-02-21 DIAGNOSIS — Z992 Dependence on renal dialysis: Secondary | ICD-10-CM | POA: Diagnosis not present

## 2020-02-23 DIAGNOSIS — N186 End stage renal disease: Secondary | ICD-10-CM | POA: Diagnosis not present

## 2020-02-23 DIAGNOSIS — Z992 Dependence on renal dialysis: Secondary | ICD-10-CM | POA: Diagnosis not present

## 2020-02-23 DIAGNOSIS — N2581 Secondary hyperparathyroidism of renal origin: Secondary | ICD-10-CM | POA: Diagnosis not present

## 2020-02-23 DIAGNOSIS — E1129 Type 2 diabetes mellitus with other diabetic kidney complication: Secondary | ICD-10-CM | POA: Diagnosis not present

## 2020-02-26 DIAGNOSIS — Z992 Dependence on renal dialysis: Secondary | ICD-10-CM | POA: Diagnosis not present

## 2020-02-26 DIAGNOSIS — E1129 Type 2 diabetes mellitus with other diabetic kidney complication: Secondary | ICD-10-CM | POA: Diagnosis not present

## 2020-02-26 DIAGNOSIS — N186 End stage renal disease: Secondary | ICD-10-CM | POA: Diagnosis not present

## 2020-02-26 DIAGNOSIS — N2581 Secondary hyperparathyroidism of renal origin: Secondary | ICD-10-CM | POA: Diagnosis not present

## 2020-02-28 DIAGNOSIS — Z992 Dependence on renal dialysis: Secondary | ICD-10-CM | POA: Diagnosis not present

## 2020-02-28 DIAGNOSIS — N186 End stage renal disease: Secondary | ICD-10-CM | POA: Diagnosis not present

## 2020-02-28 DIAGNOSIS — N2581 Secondary hyperparathyroidism of renal origin: Secondary | ICD-10-CM | POA: Diagnosis not present

## 2020-02-28 DIAGNOSIS — E1129 Type 2 diabetes mellitus with other diabetic kidney complication: Secondary | ICD-10-CM | POA: Diagnosis not present

## 2020-03-01 DIAGNOSIS — Z992 Dependence on renal dialysis: Secondary | ICD-10-CM | POA: Diagnosis not present

## 2020-03-01 DIAGNOSIS — E1129 Type 2 diabetes mellitus with other diabetic kidney complication: Secondary | ICD-10-CM | POA: Diagnosis not present

## 2020-03-01 DIAGNOSIS — N2581 Secondary hyperparathyroidism of renal origin: Secondary | ICD-10-CM | POA: Diagnosis not present

## 2020-03-01 DIAGNOSIS — N186 End stage renal disease: Secondary | ICD-10-CM | POA: Diagnosis not present

## 2020-03-04 DIAGNOSIS — Z992 Dependence on renal dialysis: Secondary | ICD-10-CM | POA: Diagnosis not present

## 2020-03-04 DIAGNOSIS — N186 End stage renal disease: Secondary | ICD-10-CM | POA: Diagnosis not present

## 2020-03-04 DIAGNOSIS — N2581 Secondary hyperparathyroidism of renal origin: Secondary | ICD-10-CM | POA: Diagnosis not present

## 2020-03-04 DIAGNOSIS — E1129 Type 2 diabetes mellitus with other diabetic kidney complication: Secondary | ICD-10-CM | POA: Diagnosis not present

## 2020-03-06 DIAGNOSIS — Z992 Dependence on renal dialysis: Secondary | ICD-10-CM | POA: Diagnosis not present

## 2020-03-06 DIAGNOSIS — N2581 Secondary hyperparathyroidism of renal origin: Secondary | ICD-10-CM | POA: Diagnosis not present

## 2020-03-06 DIAGNOSIS — N186 End stage renal disease: Secondary | ICD-10-CM | POA: Diagnosis not present

## 2020-03-06 DIAGNOSIS — E1129 Type 2 diabetes mellitus with other diabetic kidney complication: Secondary | ICD-10-CM | POA: Diagnosis not present

## 2020-03-08 DIAGNOSIS — N2581 Secondary hyperparathyroidism of renal origin: Secondary | ICD-10-CM | POA: Diagnosis not present

## 2020-03-08 DIAGNOSIS — N186 End stage renal disease: Secondary | ICD-10-CM | POA: Diagnosis not present

## 2020-03-08 DIAGNOSIS — E1129 Type 2 diabetes mellitus with other diabetic kidney complication: Secondary | ICD-10-CM | POA: Diagnosis not present

## 2020-03-08 DIAGNOSIS — Z992 Dependence on renal dialysis: Secondary | ICD-10-CM | POA: Diagnosis not present

## 2020-03-10 DIAGNOSIS — N186 End stage renal disease: Secondary | ICD-10-CM | POA: Diagnosis not present

## 2020-03-10 DIAGNOSIS — Z992 Dependence on renal dialysis: Secondary | ICD-10-CM | POA: Diagnosis not present

## 2020-03-10 DIAGNOSIS — E1122 Type 2 diabetes mellitus with diabetic chronic kidney disease: Secondary | ICD-10-CM | POA: Diagnosis not present

## 2020-03-11 DIAGNOSIS — Z992 Dependence on renal dialysis: Secondary | ICD-10-CM | POA: Diagnosis not present

## 2020-03-11 DIAGNOSIS — E1129 Type 2 diabetes mellitus with other diabetic kidney complication: Secondary | ICD-10-CM | POA: Diagnosis not present

## 2020-03-11 DIAGNOSIS — N186 End stage renal disease: Secondary | ICD-10-CM | POA: Diagnosis not present

## 2020-03-11 DIAGNOSIS — D631 Anemia in chronic kidney disease: Secondary | ICD-10-CM | POA: Diagnosis not present

## 2020-03-11 DIAGNOSIS — N2581 Secondary hyperparathyroidism of renal origin: Secondary | ICD-10-CM | POA: Diagnosis not present

## 2020-03-13 DIAGNOSIS — Z992 Dependence on renal dialysis: Secondary | ICD-10-CM | POA: Diagnosis not present

## 2020-03-13 DIAGNOSIS — R0602 Shortness of breath: Secondary | ICD-10-CM | POA: Insufficient documentation

## 2020-03-13 DIAGNOSIS — N2581 Secondary hyperparathyroidism of renal origin: Secondary | ICD-10-CM | POA: Diagnosis not present

## 2020-03-13 DIAGNOSIS — N186 End stage renal disease: Secondary | ICD-10-CM | POA: Diagnosis not present

## 2020-03-13 DIAGNOSIS — D631 Anemia in chronic kidney disease: Secondary | ICD-10-CM | POA: Diagnosis not present

## 2020-03-13 DIAGNOSIS — E1129 Type 2 diabetes mellitus with other diabetic kidney complication: Secondary | ICD-10-CM | POA: Diagnosis not present

## 2020-03-15 DIAGNOSIS — Z992 Dependence on renal dialysis: Secondary | ICD-10-CM | POA: Diagnosis not present

## 2020-03-15 DIAGNOSIS — E1129 Type 2 diabetes mellitus with other diabetic kidney complication: Secondary | ICD-10-CM | POA: Diagnosis not present

## 2020-03-15 DIAGNOSIS — D631 Anemia in chronic kidney disease: Secondary | ICD-10-CM | POA: Diagnosis not present

## 2020-03-15 DIAGNOSIS — N2581 Secondary hyperparathyroidism of renal origin: Secondary | ICD-10-CM | POA: Diagnosis not present

## 2020-03-15 DIAGNOSIS — N186 End stage renal disease: Secondary | ICD-10-CM | POA: Diagnosis not present

## 2020-03-15 MED FILL — glipiZIDE 10 MG TABS: 10 | 30 days supply | Qty: 60 | Fill #1

## 2020-03-15 MED FILL — hydrALAZINE HCL 10 MG TABS: 10 | 30 days supply | Qty: 90 | Fill #2

## 2020-03-15 MED FILL — WARFARIN SODIUM 5 MG TABLET: 5 | 68 days supply | Qty: 78 | Fill #1

## 2020-03-18 MED FILL — CARVEDILOL 25 MG TABLET: 25 | 30 days supply | Qty: 60 | Fill #2

## 2020-03-20 DIAGNOSIS — N2581 Secondary hyperparathyroidism of renal origin: Secondary | ICD-10-CM | POA: Diagnosis not present

## 2020-03-20 DIAGNOSIS — E1129 Type 2 diabetes mellitus with other diabetic kidney complication: Secondary | ICD-10-CM | POA: Diagnosis not present

## 2020-03-20 DIAGNOSIS — N186 End stage renal disease: Secondary | ICD-10-CM | POA: Diagnosis not present

## 2020-03-20 DIAGNOSIS — Z992 Dependence on renal dialysis: Secondary | ICD-10-CM | POA: Diagnosis not present

## 2020-03-20 DIAGNOSIS — D631 Anemia in chronic kidney disease: Secondary | ICD-10-CM | POA: Diagnosis not present

## 2020-03-21 ENCOUNTER — Telehealth: Payer: Self-pay

## 2020-03-21 NOTE — Telephone Encounter (Signed)
lmom for overdue inr 

## 2020-03-22 DIAGNOSIS — E1129 Type 2 diabetes mellitus with other diabetic kidney complication: Secondary | ICD-10-CM | POA: Diagnosis not present

## 2020-03-22 DIAGNOSIS — N2581 Secondary hyperparathyroidism of renal origin: Secondary | ICD-10-CM | POA: Diagnosis not present

## 2020-03-22 DIAGNOSIS — D631 Anemia in chronic kidney disease: Secondary | ICD-10-CM | POA: Diagnosis not present

## 2020-03-22 DIAGNOSIS — N186 End stage renal disease: Secondary | ICD-10-CM | POA: Diagnosis not present

## 2020-03-22 DIAGNOSIS — Z992 Dependence on renal dialysis: Secondary | ICD-10-CM | POA: Diagnosis not present

## 2020-03-25 DIAGNOSIS — Z992 Dependence on renal dialysis: Secondary | ICD-10-CM | POA: Diagnosis not present

## 2020-03-25 DIAGNOSIS — N186 End stage renal disease: Secondary | ICD-10-CM | POA: Diagnosis not present

## 2020-03-25 DIAGNOSIS — D631 Anemia in chronic kidney disease: Secondary | ICD-10-CM | POA: Diagnosis not present

## 2020-03-25 DIAGNOSIS — E1129 Type 2 diabetes mellitus with other diabetic kidney complication: Secondary | ICD-10-CM | POA: Diagnosis not present

## 2020-03-25 DIAGNOSIS — N2581 Secondary hyperparathyroidism of renal origin: Secondary | ICD-10-CM | POA: Diagnosis not present

## 2020-03-27 ENCOUNTER — Telehealth: Payer: Self-pay | Admitting: Cardiovascular Disease

## 2020-03-27 DIAGNOSIS — E1129 Type 2 diabetes mellitus with other diabetic kidney complication: Secondary | ICD-10-CM | POA: Diagnosis not present

## 2020-03-27 DIAGNOSIS — Z992 Dependence on renal dialysis: Secondary | ICD-10-CM | POA: Diagnosis not present

## 2020-03-27 DIAGNOSIS — N186 End stage renal disease: Secondary | ICD-10-CM | POA: Diagnosis not present

## 2020-03-27 DIAGNOSIS — N2581 Secondary hyperparathyroidism of renal origin: Secondary | ICD-10-CM | POA: Diagnosis not present

## 2020-03-27 DIAGNOSIS — D631 Anemia in chronic kidney disease: Secondary | ICD-10-CM | POA: Diagnosis not present

## 2020-03-27 MED FILL — glipiZIDE 10 MG TABS: 10 | 30 days supply | Qty: 60 | Fill #1

## 2020-03-27 MED FILL — CARVEDILOL 25 MG TABLET: 25 | 30 days supply | Qty: 60 | Fill #2

## 2020-03-27 MED FILL — WARFARIN SODIUM 5 MG TABLET: 5 | 68 days supply | Qty: 78 | Fill #1

## 2020-03-27 NOTE — Telephone Encounter (Signed)
Pt c/o medication issue:  1. Name of Medication:   hydrALAZINE (APRESOLINE) 50 MG tablet    2. How are you currently taking this medication (dosage and times per day)? unsure  3. Are you having a reaction (difficulty breathing--STAT)? no  4. What is your medication issue? The [facility]ist with [facility] is confused on the instructions for this medication. She stated that they have a prescription for 100mg  and 50mg . The 50mg  shows expired on Epic. Please advise.

## 2020-03-27 NOTE — Telephone Encounter (Signed)
Attempted twice to speak with pharmacy and automated message states no one available and disconnects phone call ./cy

## 2020-03-29 DIAGNOSIS — D631 Anemia in chronic kidney disease: Secondary | ICD-10-CM | POA: Diagnosis not present

## 2020-03-29 DIAGNOSIS — E1129 Type 2 diabetes mellitus with other diabetic kidney complication: Secondary | ICD-10-CM | POA: Diagnosis not present

## 2020-03-29 DIAGNOSIS — Z992 Dependence on renal dialysis: Secondary | ICD-10-CM | POA: Diagnosis not present

## 2020-03-29 DIAGNOSIS — N2581 Secondary hyperparathyroidism of renal origin: Secondary | ICD-10-CM | POA: Diagnosis not present

## 2020-03-29 DIAGNOSIS — N186 End stage renal disease: Secondary | ICD-10-CM | POA: Diagnosis not present

## 2020-04-01 DIAGNOSIS — Z992 Dependence on renal dialysis: Secondary | ICD-10-CM | POA: Diagnosis not present

## 2020-04-01 DIAGNOSIS — N186 End stage renal disease: Secondary | ICD-10-CM | POA: Diagnosis not present

## 2020-04-01 DIAGNOSIS — N2581 Secondary hyperparathyroidism of renal origin: Secondary | ICD-10-CM | POA: Diagnosis not present

## 2020-04-01 DIAGNOSIS — E1129 Type 2 diabetes mellitus with other diabetic kidney complication: Secondary | ICD-10-CM | POA: Diagnosis not present

## 2020-04-01 DIAGNOSIS — D631 Anemia in chronic kidney disease: Secondary | ICD-10-CM | POA: Diagnosis not present

## 2020-04-01 NOTE — Telephone Encounter (Signed)
Last office note states Hydralazine 50 mg bid Pharmacy notified .Adonis Housekeeper

## 2020-04-03 DIAGNOSIS — Z992 Dependence on renal dialysis: Secondary | ICD-10-CM | POA: Diagnosis not present

## 2020-04-03 DIAGNOSIS — D631 Anemia in chronic kidney disease: Secondary | ICD-10-CM | POA: Diagnosis not present

## 2020-04-03 DIAGNOSIS — N186 End stage renal disease: Secondary | ICD-10-CM | POA: Diagnosis not present

## 2020-04-03 DIAGNOSIS — E1129 Type 2 diabetes mellitus with other diabetic kidney complication: Secondary | ICD-10-CM | POA: Diagnosis not present

## 2020-04-03 DIAGNOSIS — N2581 Secondary hyperparathyroidism of renal origin: Secondary | ICD-10-CM | POA: Diagnosis not present

## 2020-04-05 DIAGNOSIS — D631 Anemia in chronic kidney disease: Secondary | ICD-10-CM | POA: Diagnosis not present

## 2020-04-05 DIAGNOSIS — N186 End stage renal disease: Secondary | ICD-10-CM | POA: Diagnosis not present

## 2020-04-05 DIAGNOSIS — E1129 Type 2 diabetes mellitus with other diabetic kidney complication: Secondary | ICD-10-CM | POA: Diagnosis not present

## 2020-04-05 DIAGNOSIS — N2581 Secondary hyperparathyroidism of renal origin: Secondary | ICD-10-CM | POA: Diagnosis not present

## 2020-04-05 DIAGNOSIS — Z992 Dependence on renal dialysis: Secondary | ICD-10-CM | POA: Diagnosis not present

## 2020-04-08 DIAGNOSIS — D631 Anemia in chronic kidney disease: Secondary | ICD-10-CM | POA: Diagnosis not present

## 2020-04-08 DIAGNOSIS — N2581 Secondary hyperparathyroidism of renal origin: Secondary | ICD-10-CM | POA: Diagnosis not present

## 2020-04-08 DIAGNOSIS — E1129 Type 2 diabetes mellitus with other diabetic kidney complication: Secondary | ICD-10-CM | POA: Diagnosis not present

## 2020-04-08 DIAGNOSIS — Z992 Dependence on renal dialysis: Secondary | ICD-10-CM | POA: Diagnosis not present

## 2020-04-08 DIAGNOSIS — N186 End stage renal disease: Secondary | ICD-10-CM | POA: Diagnosis not present

## 2020-04-10 DIAGNOSIS — N186 End stage renal disease: Secondary | ICD-10-CM | POA: Diagnosis not present

## 2020-04-10 DIAGNOSIS — N2581 Secondary hyperparathyroidism of renal origin: Secondary | ICD-10-CM | POA: Diagnosis not present

## 2020-04-10 DIAGNOSIS — Z992 Dependence on renal dialysis: Secondary | ICD-10-CM | POA: Diagnosis not present

## 2020-04-10 DIAGNOSIS — D631 Anemia in chronic kidney disease: Secondary | ICD-10-CM | POA: Diagnosis not present

## 2020-04-10 DIAGNOSIS — E1122 Type 2 diabetes mellitus with diabetic chronic kidney disease: Secondary | ICD-10-CM | POA: Diagnosis not present

## 2020-04-10 DIAGNOSIS — E1129 Type 2 diabetes mellitus with other diabetic kidney complication: Secondary | ICD-10-CM | POA: Diagnosis not present

## 2020-04-12 DIAGNOSIS — E1129 Type 2 diabetes mellitus with other diabetic kidney complication: Secondary | ICD-10-CM | POA: Diagnosis not present

## 2020-04-12 DIAGNOSIS — Z992 Dependence on renal dialysis: Secondary | ICD-10-CM | POA: Diagnosis not present

## 2020-04-12 DIAGNOSIS — N186 End stage renal disease: Secondary | ICD-10-CM | POA: Diagnosis not present

## 2020-04-12 DIAGNOSIS — D631 Anemia in chronic kidney disease: Secondary | ICD-10-CM | POA: Diagnosis not present

## 2020-04-12 DIAGNOSIS — N2581 Secondary hyperparathyroidism of renal origin: Secondary | ICD-10-CM | POA: Diagnosis not present

## 2020-04-15 DIAGNOSIS — E1129 Type 2 diabetes mellitus with other diabetic kidney complication: Secondary | ICD-10-CM | POA: Diagnosis not present

## 2020-04-15 DIAGNOSIS — N2581 Secondary hyperparathyroidism of renal origin: Secondary | ICD-10-CM | POA: Diagnosis not present

## 2020-04-15 DIAGNOSIS — D631 Anemia in chronic kidney disease: Secondary | ICD-10-CM | POA: Diagnosis not present

## 2020-04-15 DIAGNOSIS — N186 End stage renal disease: Secondary | ICD-10-CM | POA: Diagnosis not present

## 2020-04-15 DIAGNOSIS — Z992 Dependence on renal dialysis: Secondary | ICD-10-CM | POA: Diagnosis not present

## 2020-04-17 DIAGNOSIS — N2581 Secondary hyperparathyroidism of renal origin: Secondary | ICD-10-CM | POA: Diagnosis not present

## 2020-04-17 DIAGNOSIS — E1129 Type 2 diabetes mellitus with other diabetic kidney complication: Secondary | ICD-10-CM | POA: Diagnosis not present

## 2020-04-17 DIAGNOSIS — D631 Anemia in chronic kidney disease: Secondary | ICD-10-CM | POA: Diagnosis not present

## 2020-04-17 DIAGNOSIS — Z992 Dependence on renal dialysis: Secondary | ICD-10-CM | POA: Diagnosis not present

## 2020-04-17 DIAGNOSIS — N186 End stage renal disease: Secondary | ICD-10-CM | POA: Diagnosis not present

## 2020-04-19 DIAGNOSIS — N186 End stage renal disease: Secondary | ICD-10-CM | POA: Diagnosis not present

## 2020-04-19 DIAGNOSIS — E1129 Type 2 diabetes mellitus with other diabetic kidney complication: Secondary | ICD-10-CM | POA: Diagnosis not present

## 2020-04-19 DIAGNOSIS — N2581 Secondary hyperparathyroidism of renal origin: Secondary | ICD-10-CM | POA: Diagnosis not present

## 2020-04-19 DIAGNOSIS — Z992 Dependence on renal dialysis: Secondary | ICD-10-CM | POA: Diagnosis not present

## 2020-04-19 DIAGNOSIS — D631 Anemia in chronic kidney disease: Secondary | ICD-10-CM | POA: Diagnosis not present

## 2020-04-22 DIAGNOSIS — E1129 Type 2 diabetes mellitus with other diabetic kidney complication: Secondary | ICD-10-CM | POA: Diagnosis not present

## 2020-04-22 DIAGNOSIS — N2581 Secondary hyperparathyroidism of renal origin: Secondary | ICD-10-CM | POA: Diagnosis not present

## 2020-04-22 DIAGNOSIS — Z992 Dependence on renal dialysis: Secondary | ICD-10-CM | POA: Diagnosis not present

## 2020-04-22 DIAGNOSIS — N186 End stage renal disease: Secondary | ICD-10-CM | POA: Diagnosis not present

## 2020-04-22 DIAGNOSIS — D631 Anemia in chronic kidney disease: Secondary | ICD-10-CM | POA: Diagnosis not present

## 2020-04-24 DIAGNOSIS — Z992 Dependence on renal dialysis: Secondary | ICD-10-CM | POA: Diagnosis not present

## 2020-04-24 DIAGNOSIS — D631 Anemia in chronic kidney disease: Secondary | ICD-10-CM | POA: Diagnosis not present

## 2020-04-24 DIAGNOSIS — E1129 Type 2 diabetes mellitus with other diabetic kidney complication: Secondary | ICD-10-CM | POA: Diagnosis not present

## 2020-04-24 DIAGNOSIS — N2581 Secondary hyperparathyroidism of renal origin: Secondary | ICD-10-CM | POA: Diagnosis not present

## 2020-04-24 DIAGNOSIS — N186 End stage renal disease: Secondary | ICD-10-CM | POA: Diagnosis not present

## 2020-04-26 DIAGNOSIS — E1129 Type 2 diabetes mellitus with other diabetic kidney complication: Secondary | ICD-10-CM | POA: Diagnosis not present

## 2020-04-26 DIAGNOSIS — Z992 Dependence on renal dialysis: Secondary | ICD-10-CM | POA: Diagnosis not present

## 2020-04-26 DIAGNOSIS — D631 Anemia in chronic kidney disease: Secondary | ICD-10-CM | POA: Diagnosis not present

## 2020-04-26 DIAGNOSIS — N186 End stage renal disease: Secondary | ICD-10-CM | POA: Diagnosis not present

## 2020-04-26 DIAGNOSIS — N2581 Secondary hyperparathyroidism of renal origin: Secondary | ICD-10-CM | POA: Diagnosis not present

## 2020-04-29 DIAGNOSIS — N186 End stage renal disease: Secondary | ICD-10-CM | POA: Diagnosis not present

## 2020-04-29 DIAGNOSIS — N2581 Secondary hyperparathyroidism of renal origin: Secondary | ICD-10-CM | POA: Diagnosis not present

## 2020-04-29 DIAGNOSIS — E1129 Type 2 diabetes mellitus with other diabetic kidney complication: Secondary | ICD-10-CM | POA: Diagnosis not present

## 2020-04-29 DIAGNOSIS — D631 Anemia in chronic kidney disease: Secondary | ICD-10-CM | POA: Diagnosis not present

## 2020-04-29 DIAGNOSIS — Z992 Dependence on renal dialysis: Secondary | ICD-10-CM | POA: Diagnosis not present

## 2020-05-01 DIAGNOSIS — N2581 Secondary hyperparathyroidism of renal origin: Secondary | ICD-10-CM | POA: Diagnosis not present

## 2020-05-01 DIAGNOSIS — Z992 Dependence on renal dialysis: Secondary | ICD-10-CM | POA: Diagnosis not present

## 2020-05-01 DIAGNOSIS — N186 End stage renal disease: Secondary | ICD-10-CM | POA: Diagnosis not present

## 2020-05-01 DIAGNOSIS — D631 Anemia in chronic kidney disease: Secondary | ICD-10-CM | POA: Diagnosis not present

## 2020-05-01 DIAGNOSIS — E1129 Type 2 diabetes mellitus with other diabetic kidney complication: Secondary | ICD-10-CM | POA: Diagnosis not present

## 2020-05-03 DIAGNOSIS — Z992 Dependence on renal dialysis: Secondary | ICD-10-CM | POA: Diagnosis not present

## 2020-05-03 DIAGNOSIS — E1129 Type 2 diabetes mellitus with other diabetic kidney complication: Secondary | ICD-10-CM | POA: Diagnosis not present

## 2020-05-03 DIAGNOSIS — D631 Anemia in chronic kidney disease: Secondary | ICD-10-CM | POA: Diagnosis not present

## 2020-05-03 DIAGNOSIS — N186 End stage renal disease: Secondary | ICD-10-CM | POA: Diagnosis not present

## 2020-05-03 DIAGNOSIS — N2581 Secondary hyperparathyroidism of renal origin: Secondary | ICD-10-CM | POA: Diagnosis not present

## 2020-05-06 DIAGNOSIS — Z992 Dependence on renal dialysis: Secondary | ICD-10-CM | POA: Diagnosis not present

## 2020-05-06 DIAGNOSIS — N2581 Secondary hyperparathyroidism of renal origin: Secondary | ICD-10-CM | POA: Diagnosis not present

## 2020-05-06 DIAGNOSIS — D631 Anemia in chronic kidney disease: Secondary | ICD-10-CM | POA: Diagnosis not present

## 2020-05-06 DIAGNOSIS — N186 End stage renal disease: Secondary | ICD-10-CM | POA: Diagnosis not present

## 2020-05-06 DIAGNOSIS — E1129 Type 2 diabetes mellitus with other diabetic kidney complication: Secondary | ICD-10-CM | POA: Diagnosis not present

## 2020-05-08 DIAGNOSIS — E1129 Type 2 diabetes mellitus with other diabetic kidney complication: Secondary | ICD-10-CM | POA: Diagnosis not present

## 2020-05-08 DIAGNOSIS — N186 End stage renal disease: Secondary | ICD-10-CM | POA: Diagnosis not present

## 2020-05-08 DIAGNOSIS — N2581 Secondary hyperparathyroidism of renal origin: Secondary | ICD-10-CM | POA: Diagnosis not present

## 2020-05-08 DIAGNOSIS — Z992 Dependence on renal dialysis: Secondary | ICD-10-CM | POA: Diagnosis not present

## 2020-05-08 DIAGNOSIS — D631 Anemia in chronic kidney disease: Secondary | ICD-10-CM | POA: Diagnosis not present

## 2020-05-09 ENCOUNTER — Ambulatory Visit: Payer: Medicare Other | Admitting: Cardiovascular Disease

## 2020-05-09 NOTE — Progress Notes (Deleted)
Cardiology Office Note   Date:  05/09/2020   ID:  Aseel Truxillo, DOB August 04, 1956, MRN [id number]  PCP:  Azzie Glatter, FNP  Cardiologist:   Skeet Latch, MD   No chief complaint on file.    History of Present Illness: Amber Young is a 64 y.o. female with CAD status post LAD PCI in 2017, RCA 2019, paroxysmal atrial fibrillation, chronic systolic and diastolic heart failure (LVEF 25-30% improved to 35 to 40%), hypertension, hyperlipidemia, prior stroke, and ESRD on HD here for follow up.  She initially established care 04/2019.  She was previously a patient of Dr. Bronson Ing but moved to [place] from [place].  She was admitted to the hospital 01/2019 with acute heart failure exacerbation.  BNP was elevated to 2666 in the setting of hypertensive urgency due to medication noncompliance.  She had an echo at that time that revealed LVEF 25 to 30% which was reduced from 40 to 45% previously.  She was not started on an ARB/ARNI due to her chronic kidney disease.  Cardiac catheterization was not pursued due to her renal dysfunction and lack of chest pain.  At her first telemedicine visit se was volume overloaded and lasix and metolazone were increased.  She saw Coletta Memos, NP later that week in the office and her edema and orthopnea were improving.  Lasix was reduced back to 80mg  bid with metolazone q3 days.  However her symptoms recurred 06/2019.  She was seen in the office 06/29/19 and was markedly volume overloaded despite increasing lasix and metolazone.  She was direct admitted to the hospital for diuresis.  During that hospitalization she was started on HD and had a fistula placed in the L upper extremity.  Echo at that time revealed LVEF 35 to 40% with grade 2 diastolic dysfunction.  Amber Young goes to HD MWF and tolerates a full session.  Amlodipine and hydralazine were both discontinued by her nephrologist and then restarted.  Past Medical History:  Diagnosis Date  .  Anemia   . Atrial fibrillation ([place]) 06/17/2019  . Cataract, bilateral   . Chronic combined systolic and diastolic CHF (congestive heart failure) ([place])    a. EF 40-45% in 2019 b. EF 25-30% by repeat echo in 01/2019  . CKD (chronic kidney disease), stage IV ([place])   . Coronary artery disease 08/2017   DES x 2 RCA  . Dialysis patient ([place]) 07/2019  . DKA (diabetic ketoacidoses) ([place]) 06/16/2019  . GERD (gastroesophageal reflux disease)   . Glaucoma, left eye   . Heart murmur   . Hyperlipidemia   . Hypertension   . Proliferative diabetic retinopathy ([place])    left eye with vitreous hemorrhage and tractional retinal detachment  . Stroke ([place]) 09/2014   numbness left upper lip, finger tips on left hand; "resolved" (05/18/2016)  . Type II diabetes mellitus ([place])   . Wears glasses     Past Surgical History:  Procedure Laterality Date  . AV FISTULA PLACEMENT Left 07/10/2019   Procedure: ARTERIOVENOUS (AV) FISTULA CREATION;  Surgeon: Angelia Mould, MD;  Location: [facility];  Service: Vascular;  Laterality: Left;  . CARDIAC CATHETERIZATION N/A 05/18/2016   Procedure: Left Heart Cath and Coronary Angiography;  Surgeon: Jettie Booze, MD;  Location: [facility];  Service: Cardiovascular;  Laterality: N/A;  . CARDIAC CATHETERIZATION N/A 05/18/2016   Procedure: Coronary Stent Intervention;  Surgeon: Jettie Booze, MD;  Location: [facility];  Service: Cardiovascular;  Laterality: N/A;  .  CATARACT EXTRACTION Right 2020  . COLONOSCOPY W/ BIOPSIES AND POLYPECTOMY    . CORONARY ANGIOPLASTY    . CORONARY ANGIOPLASTY WITH STENT PLACEMENT    . CORONARY STENT INTERVENTION N/A 09/07/2017   Procedure: CORONARY STENT INTERVENTION;  Surgeon: Leonie Man, MD;  Location: [facility];  Service: Cardiovascular;  Laterality: N/A;  . EYE SURGERY    . GAS INSERTION Left 01/13/2018   Procedure: INSERTION OF GAS;  Surgeon: Bernarda Caffey, MD;  Location: [facility];  Service:  Ophthalmology;  Laterality: Left;  . INSERTION OF DIALYSIS CATHETER Right 07/10/2019   Procedure: INSERTION OF TUNNELED DIALYSIS CATHETER;  Surgeon: Angelia Mould, MD;  Location: [facility];  Service: Vascular;  Laterality: Right;  . IR FLUORO GUIDE CV LINE RIGHT  07/05/2019  . IR US GUIDE VASC ACCESS RIGHT  07/05/2019  . LEFT HEART CATH AND CORONARY ANGIOGRAPHY N/A 09/07/2017   Procedure: LEFT HEART CATH AND CORONARY ANGIOGRAPHY;  Surgeon: Leonie Man, MD;  Location: [facility];  Service: Cardiovascular;  Laterality: N/A;  . MEMBRANE PEEL Left 01/13/2018   Procedure: MEMBRANE PEEL LEFT EYE ;  Surgeon: Bernarda Caffey, MD;  Location: [facility];  Service: Ophthalmology;  Laterality: Left;  Marland Kitchen MULTIPLE TOOTH EXTRACTIONS    . PARS PLANA VITRECTOMY Left 01/13/2018   Procedure: PARS PLANA VITRECTOMY WITH 25 GAUGE LEFT EYE WITH ENDOLASER;  Surgeon: Bernarda Caffey, MD;  Location: [facility];  Service: Ophthalmology;  Laterality: Left;  . TUBAL LIGATION  1980  . VAGINAL HYSTERECTOMY  1999   "partial; fibroids"     Current Outpatient Medications  Medication Sig Dispense Refill  . acetaminophen (TYLENOL) 500 MG tablet Take 2 tablets (1,000 mg total) by mouth every 6 (six) hours as needed for mild pain or headache. 30 tablet 11  . ALPRAZolam (XANAX) 0.25 MG tablet Take 1 tablet (0.25 mg total) by mouth 2 (two) times daily as needed for anxiety. 20 tablet 0  . amLODipine (NORVASC) 10 MG tablet TAKE 1 TABLET (10 MG TOTAL) BY MOUTH DAILY. 90 tablet 2  . aspirin EC 81 MG tablet Take 81 mg by mouth daily.    Marland Kitchen atorvastatin (LIPITOR) 40 MG tablet Take 1 tablet (40 mg total) by mouth daily at 6 PM. 90 tablet 1  . brimonidine (ALPHAGAN) 0.2 % ophthalmic solution Place 1 drop into the left eye 2 (two) times daily.    . carvedilol (COREG) 25 MG tablet Take 1 tablet (25 mg total) by mouth 2 (two) times daily. 180 tablet 1  . dorzolamide-timolol (COSOPT) 22.3-6.8 MG/ML ophthalmic solution Place 1 drop into the  left eye 2 (two) times daily.    . feeding supplement, ENSURE ENLIVE, (ENSURE ENLIVE) LIQD Take 237 mLs by mouth 2 (two) times daily between meals. 237 mL 12  . ferric citrate (AURYXIA) 1 GM 210 MG(Fe) tablet Take by mouth.    . ferrous sulfate (FERROUSUL) 325 (65 FE) MG tablet Take 1 tablet (325 mg total) by mouth every other day. 90 tablet 1  . glipiZIDE (GLUCOTROL) 10 MG tablet Take 1 tablet (10 mg total) by mouth 2 (two) times daily before a meal. 180 tablet 1  . hydrALAZINE (APRESOLINE) 50 MG tablet Take 1 tablet (50 mg total) by mouth 2 (two) times daily. 180 tablet 1  . Insulin Glargine (LANTUS) 100 UNIT/ML Solostar Pen Inject 4 Units into the skin daily. 9 mL 1  . Insulin Pen Needle (TRUEPLUS PEN NEEDLES) 31G X 6 MM MISC USE AS  DIRECTED 300 each 1  . isosorbide mononitrate (IMDUR) 30 MG 24 hr tablet Take 1 tablet (30 mg total) by mouth daily. 30 tablet 0  . ketorolac (ACULAR) 0.4 % SOLN Place 1 drop into the right eye 4 (four) times daily.    . multivitamin (RENA-VIT) TABS tablet Take 1 tablet by mouth at bedtime. 30 tablet 0  . nitroGLYCERIN (NITROSTAT) 0.4 MG SL tablet Place 1 tablet (0.4 mg total) under the tongue every 5 (five) minutes x 3 doses as needed for chest pain. 90 tablet 1  . ofloxacin (OCUFLOX) 0.3 % ophthalmic solution Place 1 drop into the right eye 3 (three) times daily.    Marland Kitchen warfarin (COUMADIN) 5 MG tablet Take 5mg  daily except for 7.5mg  every Monday, Wednesday and Friday (Patient taking differently: Take 5-7.5 mg by mouth See admin instructions. Take 5mg  daily except for 7.5mg  every Monday, Wednesday and Friday) 90 tablet 1   No current facility-administered medications for this visit.    Allergies:   Ace inhibitors    Social History:  The patient  reports that she has never smoked. She has never used smokeless tobacco. She reports that she does not drink alcohol and does not use drugs.   Family History:  The patient's family history includes COPD in her mother;  Diabetes in her mother; Heart failure in her mother; Hypertension in her brother, mother, and sister.    ROS:  Please see the history of present illness.   Otherwise, review of systems are positive for none.   All other systems are reviewed and negative.    PHYSICAL EXAM: VS:  There were no vitals taken for this visit. , BMI There is no height or weight on file to calculate BMI. GENERAL:  Chronically ill-appearing HEENT: Pupils equal round and reactive, fundi not visualized, oral mucosa unremarkable NECK:  No jugular venous distention, waveform within normal limits, carotid upstroke brisk and symmetric, +L carotid bruit LUNGS:  Clear to auscultation bilaterally HEART:  RRR.  PMI not displaced or sustained,S1 and S2 within normal limits, + S3, no S4, no clicks, no rubs, no murmurs ABD:  Flat, positive bowel sounds normal in frequency in pitch, no bruits, no rebound, no guarding, no midline pulsatile mass, no hepatomegaly, no splenomegaly EXT:  2 plus pulses throughout, no LE edema, no cyanosis no clubbing SKIN:  No rashes no nodules NEURO:  Cranial nerves II through XII grossly intact, motor grossly intact throughout PSYCH:  Cognitively intact, oriented to person place and time   EKG:  EKG is not ordered today.   Recent Labs: 06/29/2019: ALT 31; B Natriuretic Peptide 2,340.4; TSH 1.162 07/10/2019: Magnesium 2.1 07/13/2019: BUN 38; Creatinine, Ser 4.59; Hemoglobin 8.4; Platelets 309; Potassium 3.9; Sodium 131    Lipid Panel    Component Value Date/Time   CHOL 217 (H) 01/28/2019 0536   TRIG 59 01/28/2019 0536   HDL 75 01/28/2019 0536   CHOLHDL 2.9 01/28/2019 0536   VLDL 12 01/28/2019 0536   LDLCALC 130 (H) 01/28/2019 0536      Wt Readings from Last 3 Encounters:  10/05/19 165 lb (74.8 kg)  09/21/19 163 lb (73.9 kg)  08/16/19 158 lb (71.7 kg)      ASSESSMENT AND PLAN:  # Chronic systolic and diastolic heart failure: # Essential hypertension: LVEF  20-25% from 40-45%  07/2018.  She has ischemic cardiomyopathy and a history of poorly controlled BP.  She hasn't been on guideline directed therapy 2/2 CKD IV.  She is now  on HD.  If her blood pressure remains poorly controlled in the future, would plan to add an ARB.  She is unsure of who her nephrologist is or where she dialyzes.  Continue amlodipine, carvedilol,  and isosorbide.  # PAF:   Currently in sinus rhythm.  She is on warfarin for anticoagulation.  INR is 1.2 and frequently subtherapeutic.  She seems to have very limited medical insight.  It is unclear what she is taking at home.  We will work with our Education officer, museum to help with medication assistance and adherence in the home.  Continue carvedilol and warfarin for now.  Consider Eliquis despite the fact that she is on HD.  # CAD: She had two DESs placed in the RCA 08/2017 and one in the LAD in 2017.  No ischemic symptoms. Continue carvedilol, Isordil, and atorvastatin.  Stop ticagrelor and start aspirin 81 mg daily.  # ESRD: Now on HD MWF.    Current medicines are reviewed at length with the patient today.  The patient does not have concerns regarding medicines.  The following changes have been made:  none  Labs/ tests ordered today include:  No orders of the defined types were placed in this encounter.    Disposition:   FU with Yaretzi Ernandez C. Oval Linsey, MD, [facility] in 6 months    Signed, Abdulwahab Demelo C. Oval Linsey, MD, [facility]  05/09/2020 9:43 AM    [place]

## 2020-05-10 DIAGNOSIS — N186 End stage renal disease: Secondary | ICD-10-CM | POA: Diagnosis not present

## 2020-05-10 DIAGNOSIS — Z992 Dependence on renal dialysis: Secondary | ICD-10-CM | POA: Diagnosis not present

## 2020-05-10 DIAGNOSIS — E1122 Type 2 diabetes mellitus with diabetic chronic kidney disease: Secondary | ICD-10-CM | POA: Diagnosis not present

## 2020-05-14 ENCOUNTER — Telehealth: Payer: Self-pay

## 2020-05-14 NOTE — Telephone Encounter (Signed)
lmom for overdue inr 

## 2020-05-22 DIAGNOSIS — E1129 Type 2 diabetes mellitus with other diabetic kidney complication: Secondary | ICD-10-CM | POA: Diagnosis not present

## 2020-06-03 ENCOUNTER — Other Ambulatory Visit: Payer: Self-pay | Admitting: Family Medicine

## 2020-06-03 DIAGNOSIS — E1159 Type 2 diabetes mellitus with other circulatory complications: Secondary | ICD-10-CM

## 2020-06-03 MED FILL — AMLODIPINE BESYLATE 10 MG T: 10 | 90 days supply | Qty: 90 | Fill #2 | Status: TO

## 2020-06-04 DIAGNOSIS — I871 Compression of vein: Secondary | ICD-10-CM | POA: Diagnosis not present

## 2020-06-04 DIAGNOSIS — Z992 Dependence on renal dialysis: Secondary | ICD-10-CM | POA: Diagnosis not present

## 2020-06-04 DIAGNOSIS — N186 End stage renal disease: Secondary | ICD-10-CM | POA: Diagnosis not present

## 2020-06-05 MED FILL — hydrALAZINE HCL 50 MG TABS: 50 | 90 days supply | Qty: 180 | Fill #0

## 2020-06-06 DIAGNOSIS — I871 Compression of vein: Secondary | ICD-10-CM | POA: Diagnosis not present

## 2020-06-06 DIAGNOSIS — Z992 Dependence on renal dialysis: Secondary | ICD-10-CM | POA: Diagnosis not present

## 2020-06-06 DIAGNOSIS — I771 Stricture of artery: Secondary | ICD-10-CM | POA: Diagnosis not present

## 2020-06-06 DIAGNOSIS — N186 End stage renal disease: Secondary | ICD-10-CM | POA: Diagnosis not present

## 2020-06-24 ENCOUNTER — Other Ambulatory Visit: Payer: Self-pay | Admitting: Family Medicine

## 2020-06-24 DIAGNOSIS — E1159 Type 2 diabetes mellitus with other circulatory complications: Secondary | ICD-10-CM

## 2020-06-28 ENCOUNTER — Ambulatory Visit (INDEPENDENT_AMBULATORY_CARE_PROVIDER_SITE_OTHER): Payer: Medicare Other | Admitting: Family Medicine

## 2020-06-28 ENCOUNTER — Other Ambulatory Visit: Payer: Self-pay

## 2020-06-28 ENCOUNTER — Encounter: Payer: Self-pay | Admitting: Family Medicine

## 2020-06-28 VITALS — BP 168/88 | HR 98 | Temp 97.0°F | Ht 67.0 in | Wt 183.2 lb

## 2020-06-28 DIAGNOSIS — R42 Dizziness and giddiness: Secondary | ICD-10-CM | POA: Diagnosis not present

## 2020-06-28 DIAGNOSIS — H5442A5 Blindness left eye category 5, normal vision right eye: Secondary | ICD-10-CM

## 2020-06-28 DIAGNOSIS — N184 Chronic kidney disease, stage 4 (severe): Secondary | ICD-10-CM | POA: Diagnosis not present

## 2020-06-28 DIAGNOSIS — H5462 Unqualified visual loss, left eye, normal vision right eye: Secondary | ICD-10-CM

## 2020-06-28 DIAGNOSIS — H547 Unspecified visual loss: Secondary | ICD-10-CM

## 2020-06-28 DIAGNOSIS — I509 Heart failure, unspecified: Secondary | ICD-10-CM

## 2020-06-28 DIAGNOSIS — H409 Unspecified glaucoma: Secondary | ICD-10-CM

## 2020-06-28 DIAGNOSIS — I10 Essential (primary) hypertension: Secondary | ICD-10-CM

## 2020-06-28 DIAGNOSIS — H548 Legal blindness, as defined in USA: Secondary | ICD-10-CM

## 2020-06-28 DIAGNOSIS — Z992 Dependence on renal dialysis: Secondary | ICD-10-CM

## 2020-06-28 DIAGNOSIS — E1159 Type 2 diabetes mellitus with other circulatory complications: Secondary | ICD-10-CM | POA: Diagnosis not present

## 2020-06-28 DIAGNOSIS — Z09 Encounter for follow-up examination after completed treatment for conditions other than malignant neoplasm: Secondary | ICD-10-CM

## 2020-06-28 LAB — POCT GLYCOSYLATED HEMOGLOBIN (HGB A1C)
HbA1c POC (<> result, manual entry): 8.5 % (ref 4.0–5.6)
HbA1c, POC (controlled diabetic range): 8.5 % — AB (ref 0.0–7.0)
HbA1c, POC (prediabetic range): 8.5 % — AB (ref 5.7–6.4)
Hemoglobin A1C: 8.5 % — AB (ref 4.0–5.6)

## 2020-06-28 LAB — GLUCOSE, POCT (MANUAL RESULT ENTRY): POC Glucose: 279 mg/dl — AB (ref 70–99)

## 2020-06-28 NOTE — Progress Notes (Signed)
Patient [facility]    Established Patient Office Visit  Subjective:  Patient ID: Amber Young, female    DOB: 12-08-1955  Age: 64 y.o. MRN: [id number]  CC:  Chief Complaint  Patient presents with  . Follow-up    HPI Amber Young is a 64 year old female who presents for Follow Up today.     Patient Active Problem List   Diagnosis Date Noted  . Hemodialysis patient ([place]) 08/17/2019  . Malnutrition of moderate degree 07/12/2019  . CKD (chronic kidney disease), stage V ([place])   . Chronic combined systolic and diastolic heart failure ([place]) 06/29/2019  . DKA (diabetic ketoacidoses) 06/16/2019  . AKI (acute kidney injury) ([place]) 06/16/2019  . Acute metabolic encephalopathy 01/31/7627  . Acute encephalopathy 06/16/2019  . Hemoglobin A1C greater than 9%, indicating poor diabetic control 02/06/2019  . Glaucoma of left eye 01/23/2019  . Diabetic retinopathy of both eyes with macular edema associated with type 2 diabetes mellitus ([place]) 01/23/2019  . Cataract of both eyes 01/23/2019  . Intractable headache 01/23/2019  . Dizziness 01/23/2019  . Type 2 diabetes mellitus without complication, with long-term current use of insulin ([place]) 12/01/2018  . Left breast mass 12/01/2018  . Edema, peripheral 12/01/2018  . Visual problems 12/01/2018  . Recent weight loss 12/01/2018  . Essential hypertension   . Coronary artery disease involving native coronary artery of native heart with unstable angina pectoris ([place])   . Chronic anticoagulation 01/06/2017  . Breast abscess 12/20/2016  . Encounter for therapeutic drug monitoring 05/29/2016  . CAD S/P PCI mLAD DES (Promus 3.0 x 24 -- 3.25 mm) 05/19/2016  . PAF (paroxysmal atrial fibrillation) ([place]) 05/19/2016  . Cardiomyopathy, ischemic     Class: Diagnosis of  . Microcytic anemia 04/26/2016  . Acute on chronic combined systolic (congestive) and diastolic (congestive) heart failure ([place]) 11/14/2015   . Hypokalemia 11/14/2015  . Uncontrolled hypertension 09/30/2014  . Uncontrolled diabetes mellitus ([place]) 09/30/2014  . History of stroke 09/30/2014   Current Status: Since her last office visit, she has recently began dialysis treatments, per Dr. Jimmy Footman on Mondays, Wednesdays, and Fridays. Today, she is doing well during her dialysis treatments with no complaints. She denies visual changes, chest pain, cough, shortness of breath, heart palpitations, and falls. She has occasional headaches and dizziness with position changes. Denies severe headaches, confusion, seizures, double vision, and blurred vision, nausea and vomiting. She has not been monitoring her blood glucose levels regularly at home, but gets readings during her dialysis treatments. She denies fatigue, frequent urination, blurred vision, excessive hunger, excessive thirst, weight gain, weight loss, and poor wound healing. She continues to check her feet regularly. She denies fevers, chills, fatigue, recent infections, weight loss, and night sweats. Denies GI problems such as diarrhea, and constipation. She has no reports of blood in stools, dysuria and hematuria. No depression or anxiety reported today. She is taking all medications as prescribed. She denies pain today.   Past Medical History:  Diagnosis Date  . Anemia   . Atrial fibrillation ([place]) 06/17/2019  . Cataract, bilateral   . Chronic combined systolic and diastolic CHF (congestive heart failure) ([place])    a. EF 40-45% in 2019 b. EF 25-30% by repeat echo in 01/2019  . CKD (chronic kidney disease), stage IV ([place])   . Coronary artery disease 08/2017   DES x 2 RCA  . Dialysis patient ([place]) 07/2019  . DKA (diabetic ketoacidoses) 06/16/2019  . GERD (gastroesophageal reflux  disease)   . Glaucoma, left eye   . Heart murmur   . Hyperlipidemia   . Hypertension   . Proliferative diabetic retinopathy ([place])    left eye with vitreous hemorrhage and tractional retinal detachment    . Stroke ([place]) 09/2014   numbness left upper lip, finger tips on left hand; "resolved" (05/18/2016)  . Type II diabetes mellitus ([place])   . Wears glasses     Past Surgical History:  Procedure Laterality Date  . AV FISTULA PLACEMENT Left 07/10/2019   Procedure: ARTERIOVENOUS (AV) FISTULA CREATION;  Surgeon: Angelia Mould, MD;  Location: [facility];  Service: Vascular;  Laterality: Left;  . CARDIAC CATHETERIZATION N/A 05/18/2016   Procedure: Left Heart Cath and Coronary Angiography;  Surgeon: Jettie Booze, MD;  Location: [facility];  Service: Cardiovascular;  Laterality: N/A;  . CARDIAC CATHETERIZATION N/A 05/18/2016   Procedure: Coronary Stent Intervention;  Surgeon: Jettie Booze, MD;  Location: [facility];  Service: Cardiovascular;  Laterality: N/A;  . CATARACT EXTRACTION Right 2020  . COLONOSCOPY W/ BIOPSIES AND POLYPECTOMY    . CORONARY ANGIOPLASTY    . CORONARY ANGIOPLASTY WITH STENT PLACEMENT    . CORONARY STENT INTERVENTION N/A 09/07/2017   Procedure: CORONARY STENT INTERVENTION;  Surgeon: Leonie Man, MD;  Location: [facility];  Service: Cardiovascular;  Laterality: N/A;  . EYE SURGERY    . GAS INSERTION Left 01/13/2018   Procedure: INSERTION OF GAS;  Surgeon: Bernarda Caffey, MD;  Location: [facility];  Service: Ophthalmology;  Laterality: Left;  . INSERTION OF DIALYSIS CATHETER Right 07/10/2019   Procedure: INSERTION OF TUNNELED DIALYSIS CATHETER;  Surgeon: Angelia Mould, MD;  Location: [facility];  Service: Vascular;  Laterality: Right;  . IR FLUORO GUIDE CV LINE RIGHT  07/05/2019  . IR US GUIDE VASC ACCESS RIGHT  07/05/2019  . LEFT HEART CATH AND CORONARY ANGIOGRAPHY N/A 09/07/2017   Procedure: LEFT HEART CATH AND CORONARY ANGIOGRAPHY;  Surgeon: Leonie Man, MD;  Location: [facility];  Service: Cardiovascular;  Laterality: N/A;  . MEMBRANE PEEL Left 01/13/2018   Procedure: MEMBRANE PEEL LEFT EYE ;  Surgeon: Bernarda Caffey, MD;   Location: [facility];  Service: Ophthalmology;  Laterality: Left;  Marland Kitchen MULTIPLE TOOTH EXTRACTIONS    . PARS PLANA VITRECTOMY Left 01/13/2018   Procedure: PARS PLANA VITRECTOMY WITH 25 GAUGE LEFT EYE WITH ENDOLASER;  Surgeon: Bernarda Caffey, MD;  Location: [facility];  Service: Ophthalmology;  Laterality: Left;  . TUBAL LIGATION  1980  . VAGINAL HYSTERECTOMY  1999   "partial; fibroids"    Family History  Problem Relation Age of Onset  . Diabetes Mother   . Hypertension Mother   . COPD Mother   . Heart failure Mother   . Hypertension Sister   . Hypertension Brother     Social History   Socioeconomic History  . Marital status: Married    Spouse name: Not on file  . Number of children: Not on file  . Years of education: 25  . Highest education level: Not on file  Occupational History  . Occupation: hairdresser  Tobacco Use  . Smoking status: Never Smoker  . Smokeless tobacco: Never Used  Vaping Use  . Vaping Use: Never used  Substance and Sexual Activity  . Alcohol use: No    Alcohol/week: 0.0 standard drinks  . Drug use: No  . Sexual activity: Yes  Other Topics Concern  . Not on file  Social History Narrative   Married, 2 children, hairdresser   Caffeine use- occasional tea or coffee   Right handed   Social Determinants of Health   Financial Resource Strain:   . Difficulty of Paying Living Expenses: Not on file  Food Insecurity:   . Worried About Charity fundraiser in the Last Year: Not on file  . Ran Out of Food in the Last Year: Not on file  Transportation Needs:   . Lack of Transportation (Medical): Not on file  . Lack of Transportation (Non-Medical): Not on file  Physical Activity:   . Days of Exercise per Week: Not on file  . Minutes of Exercise per Session: Not on file  Stress:   . Feeling of Stress : Not on file  Social Connections:   . Frequency of Communication with Friends and Family: Not on file  . Frequency of Social Gatherings with Friends and Family: Not on  file  . Attends Religious Services: Not on file  . Active Member of Clubs or Organizations: Not on file  . Attends Archivist Meetings: Not on file  . Marital Status: Not on file  Intimate Partner Violence:   . Fear of Current or Ex-Partner: Not on file  . Emotionally Abused: Not on file  . Physically Abused: Not on file  . Sexually Abused: Not on file    Outpatient Medications Prior to Visit  Medication Sig Dispense Refill  . amLODipine (NORVASC) 10 MG tablet TAKE 1 TABLET (10 MG TOTAL) BY MOUTH DAILY. 90 tablet 2  . aspirin EC 81 MG tablet Take 81 mg by mouth daily.    Marland Kitchen atorvastatin (LIPITOR) 40 MG tablet Take 1 tablet (40 mg total) by mouth daily at 6 PM. 90 tablet 1  . brimonidine (ALPHAGAN) 0.2 % ophthalmic solution Place 1 drop into the left eye 2 (two) times daily.    . carvedilol (COREG) 25 MG tablet Take 1 tablet (25 mg total) by mouth 2 (two) times daily. 180 tablet 1  . dorzolamide-timolol (COSOPT) 22.3-6.8 MG/ML ophthalmic solution Place 1 drop into the left eye 2 (two) times daily.    . feeding supplement, ENSURE ENLIVE, (ENSURE ENLIVE) LIQD Take 237 mLs by mouth 2 (two) times daily between meals. 237 mL 12  . ferric citrate (AURYXIA) 1 GM 210 MG(Fe) tablet Take by mouth.    . ferrous sulfate (FERROUSUL) 325 (65 FE) MG tablet Take 1 tablet (325 mg total) by mouth every other day. 90 tablet 1  . glipiZIDE (GLUCOTROL) 10 MG tablet Take 1 tablet (10 mg total) by mouth 2 (two) times daily before a meal. 180 tablet 1  . hydrALAZINE (APRESOLINE) 50 MG tablet Take 1 tablet (50 mg total) by mouth 2 (two) times daily. 180 tablet 1  . Insulin Pen Needle (TRUEPLUS PEN NEEDLES) 31G X 6 MM MISC USE AS DIRECTED 300 each 1  . acetaminophen (TYLENOL) 500 MG tablet Take 2 tablets (1,000 mg total) by mouth every 6 (six) hours as needed for mild pain or headache. 30 tablet 11  . ALPRAZolam (XANAX) 0.25 MG tablet Take 1 tablet (0.25 mg total) by mouth 2 (two) times daily as needed for  anxiety. (Patient not taking: Reported on 06/28/2020) 20 tablet 0  . Insulin Glargine (LANTUS) 100 UNIT/ML Solostar Pen Inject 4 Units into the skin daily. (Patient not taking: Reported on 06/28/2020) 9 mL 1  . isosorbide mononitrate (IMDUR) 30 MG 24 hr tablet Take 1 tablet (30 mg total) by  mouth daily. 30 tablet 0  . ketorolac (ACULAR) 0.4 % SOLN Place 1 drop into the right eye 4 (four) times daily.    . multivitamin (RENA-VIT) TABS tablet Take 1 tablet by mouth at bedtime. 30 tablet 0  . nitroGLYCERIN (NITROSTAT) 0.4 MG SL tablet Place 1 tablet (0.4 mg total) under the tongue every 5 (five) minutes x 3 doses as needed for chest pain. 90 tablet 1  . ofloxacin (OCUFLOX) 0.3 % ophthalmic solution Place 1 drop into the right eye 3 (three) times daily.    Marland Kitchen warfarin (COUMADIN) 5 MG tablet Take 5mg  daily except for 7.5mg  every Monday, Wednesday and Friday (Patient taking differently: Take 5-7.5 mg by mouth See admin instructions. Take 5mg  daily except for 7.5mg  every Monday, Wednesday and Friday) 90 tablet 1   No facility-administered medications prior to visit.    Allergies  Allergen Reactions  . Ace Inhibitors Cough    ROS Review of Systems  Constitutional: Negative.   HENT: Negative.   Eyes: Positive for visual disturbance (glacoma; left blindness).  Respiratory: Negative.   Cardiovascular: Negative.   Gastrointestinal: Negative.   Endocrine: Negative.   Genitourinary:       Dialysis   Musculoskeletal: Positive for arthralgias (generalized).  Skin: Negative.   Allergic/Immunologic: Negative.   Neurological: Positive for dizziness (occasional ), weakness and headaches (occasional ).  Hematological: Negative.   Psychiatric/Behavioral: Negative.    Objective:    Physical Exam Vitals and nursing note reviewed.  Constitutional:      Appearance: Normal appearance.  HENT:     Head: Normocephalic and atraumatic.     Nose: Nose normal.     Mouth/Throat:     Mouth: Mucous membranes  are moist.     Pharynx: Oropharynx is clear.  Cardiovascular:     Rate and Rhythm: Normal rate and regular rhythm.     Pulses: Normal pulses.     Heart sounds: Normal heart sounds.  Pulmonary:     Effort: Pulmonary effort is normal.     Breath sounds: Normal breath sounds.  Abdominal:     General: Bowel sounds are normal.     Palpations: Abdomen is soft.  Musculoskeletal:        General: Normal range of motion.     Cervical back: Normal range of motion and neck supple.  Skin:    General: Skin is warm and dry.  Neurological:     General: No focal deficit present.     Mental Status: She is alert and oriented to person, place, and time.     Motor: Weakness (occasional ) present.     Gait: Gait abnormal.  Psychiatric:        Mood and Affect: Mood normal.        Behavior: Behavior normal.        Thought Content: Thought content normal.        Judgment: Judgment normal.    BP (!) 168/88 (BP Location: Right Arm, Patient Position: Sitting, Cuff Size: Normal)   Pulse 98   Temp (!) 97 F (36.1 C)   Ht 5\' 7"  (1.702 m)   Wt 183 lb 3.2 oz (83.1 kg)   SpO2 99%   BMI 28.69 kg/m  Wt Readings from Last 3 Encounters:  06/28/20 183 lb 3.2 oz (83.1 kg)  10/05/19 165 lb (74.8 kg)  09/21/19 163 lb (73.9 kg)    Health Maintenance Due  Topic Date Due  . Hepatitis C Screening  Never done  .  OPHTHALMOLOGY EXAM  Never done  . COVID-19 Vaccine (1) Never done  . PAP SMEAR-Modifier  Never done  . FOOT EXAM  03/31/2019  . URINE MICROALBUMIN  05/19/2019  . MAMMOGRAM  01/05/2020    There are no preventive care reminders to display for this patient.  Lab Results  Component Value Date   TSH 1.162 06/29/2019   Lab Results  Component Value Date   WBC 9.4 07/13/2019   HGB 8.4 (L) 07/13/2019   HCT 26.6 (L) 07/13/2019   MCV 78.9 (L) 07/13/2019   PLT 309 07/13/2019   Lab Results  Component Value Date   NA 131 (L) 07/13/2019   K 3.9 07/13/2019   CO2 26 07/13/2019   GLUCOSE 166 (H)  07/13/2019   BUN 38 (H) 07/13/2019   CREATININE 4.59 (H) 07/13/2019   BILITOT 0.6 06/29/2019   ALKPHOS 104 06/29/2019   AST 20 06/29/2019   ALT 31 06/29/2019   PROT 6.3 (L) 06/29/2019   ALBUMIN 3.0 (L) 07/13/2019   CALCIUM 8.7 (L) 07/13/2019   ANIONGAP 11 07/13/2019   Lab Results  Component Value Date   CHOL 217 (H) 01/28/2019   Lab Results  Component Value Date   HDL 75 01/28/2019   Lab Results  Component Value Date   LDLCALC 130 (H) 01/28/2019   Lab Results  Component Value Date   TRIG 59 01/28/2019   Lab Results  Component Value Date   CHOLHDL 2.9 01/28/2019   Lab Results  Component Value Date   HGBA1C 8.5 (A) 06/28/2020   HGBA1C 8.5 06/28/2020   HGBA1C 8.5 (A) 06/28/2020   HGBA1C 8.5 (A) 06/28/2020      Assessment & Plan:   1. Type 2 diabetes mellitus with other circulatory complication, without long-term current use of insulin (HCC) She will continue medication as prescribed, to decrease foods/beverages high in sugars and carbs and follow Heart Healthy or DASH diet. Increase physical activity to at least 30 minutes cardio exercise daily.  - Urinalysis Dipstick - POC Glucose (CBG) - POC HgB A1c  2. Dizziness Stable.   3. Has received maintenance renal dialysis treatments ([place]) Treatments Mondays, Wednesday, and Fridays. Tolerating treatments well.   4. CKD (chronic kidney disease), stage IV ([place])  5. Congestive heart failure, unspecified HF chronicity, unspecified heart failure type ([place])  6. Hemodialysis patient ([place])  7. Essential hypertension She will continue to take medications as prescribed, to decrease high sodium intake, excessive alcohol intake, increase potassium intake, smoking cessation, and increase physical activity of at least 30 minutes of cardio activity daily. She will continue to follow Heart Healthy or DASH diet.  8. Visual problems Bilateral glaucoma.   9. Glaucoma of left eye, unspecified glaucoma type  10. Legally blind  in left eye, as defined in [place]  11. Complete blindness of left eye  12. Follow up She will follow up in 6 months.   No orders of the defined types were placed in this encounter.   Orders Placed This Encounter  Procedures  . Urinalysis Dipstick  . POC Glucose (CBG)  . POC HgB A1c    Referral Orders  No referral(s) requested today    Kathe Becton,  MSN, FNP-BC [facility] [address] [place], [place] [id number] [phone] fax  Problem List Items Addressed This Visit      Cardiovascular and Mediastinum   Essential hypertension     Other   Dizziness  Glaucoma of left eye   Hemodialysis patient ([place])   Visual problems    Other Visit Diagnoses    Type 2 diabetes mellitus with other circulatory complication, without long-term current use of insulin ([place])    -  Primary   Relevant Orders   Urinalysis Dipstick   POC Glucose (CBG) (Completed)   POC HgB A1c (Completed)   Has received maintenance renal dialysis treatments ([place])       CKD (chronic kidney disease), stage IV (HCC)       Congestive heart failure, unspecified HF chronicity, unspecified heart failure type ([place])       Legally blind in left eye, as defined in [place]       Complete blindness of left eye       Follow up          No orders of the defined types were placed in this encounter.   Follow-up: No follow-ups on file.    Azzie Glatter, FNP

## 2020-07-01 ENCOUNTER — Encounter: Payer: Self-pay | Admitting: Family Medicine

## 2020-07-23 ENCOUNTER — Other Ambulatory Visit: Payer: Self-pay | Admitting: Family Medicine

## 2020-07-23 MED FILL — ATORVASTATIN CALCIUM 40 MG: 40 | 90 days supply | Qty: 90 | Fill #0

## 2020-07-23 MED FILL — glipiZIDE 10 MG TABS: 10 | 90 days supply | Qty: 180 | Fill #0

## 2020-07-23 MED FILL — CARVEDILOL 25 MG TABLET: 25 | 90 days supply | Qty: 180 | Fill #0

## 2020-09-02 ENCOUNTER — Other Ambulatory Visit: Payer: Self-pay | Admitting: Family Medicine

## 2020-09-02 MED FILL — glipiZIDE 10 MG TABS: 10 | 90 days supply | Qty: 180 | Fill #0

## 2020-09-02 MED FILL — ATORVASTATIN CALCIUM 40 MG: 40 | 90 days supply | Qty: 90 | Fill #0

## 2020-09-02 MED FILL — CARVEDILOL 25 MG TABLET: 25 | 90 days supply | Qty: 180 | Fill #0

## 2020-09-02 NOTE — Telephone Encounter (Signed)
Please see refill request.

## 2020-09-03 ENCOUNTER — Other Ambulatory Visit: Payer: Self-pay | Admitting: Family Medicine

## 2020-09-03 MED FILL — WARFARIN SODIUM 5 MG TABLET: 5 | 68 days supply | Qty: 78 | Fill #0

## 2020-09-07 ENCOUNTER — Other Ambulatory Visit: Payer: Self-pay

## 2020-09-07 ENCOUNTER — Emergency Department (HOSPITAL_COMMUNITY)
Admission: EM | Admit: 2020-09-07 | Discharge: 2020-09-08 | Disposition: A | Discharge status: Home / Self Care | Payer: Medicare Other | Attending: Emergency Medicine | Admitting: Emergency Medicine

## 2020-09-07 ENCOUNTER — Emergency Department (HOSPITAL_COMMUNITY): Payer: Medicare Other

## 2020-09-07 ENCOUNTER — Other Ambulatory Visit: Payer: Self-pay | Admitting: Emergency Medicine

## 2020-09-07 ENCOUNTER — Encounter (HOSPITAL_COMMUNITY): Payer: Self-pay | Admitting: Emergency Medicine

## 2020-09-07 DIAGNOSIS — I5043 Acute on chronic combined systolic (congestive) and diastolic (congestive) heart failure: Secondary | ICD-10-CM | POA: Insufficient documentation

## 2020-09-07 DIAGNOSIS — I25110 Atherosclerotic heart disease of native coronary artery with unstable angina pectoris: Secondary | ICD-10-CM | POA: Insufficient documentation

## 2020-09-07 DIAGNOSIS — K219 Gastro-esophageal reflux disease without esophagitis: Secondary | ICD-10-CM | POA: Diagnosis not present

## 2020-09-07 DIAGNOSIS — N186 End stage renal disease: Secondary | ICD-10-CM | POA: Diagnosis not present

## 2020-09-07 DIAGNOSIS — R1030 Lower abdominal pain, unspecified: Secondary | ICD-10-CM | POA: Diagnosis present

## 2020-09-07 DIAGNOSIS — R1084 Generalized abdominal pain: Secondary | ICD-10-CM

## 2020-09-07 DIAGNOSIS — Z992 Dependence on renal dialysis: Secondary | ICD-10-CM | POA: Diagnosis not present

## 2020-09-07 DIAGNOSIS — R112 Nausea with vomiting, unspecified: Secondary | ICD-10-CM | POA: Diagnosis not present

## 2020-09-07 DIAGNOSIS — Z7901 Long term (current) use of anticoagulants: Secondary | ICD-10-CM | POA: Insufficient documentation

## 2020-09-07 DIAGNOSIS — Z79899 Other long term (current) drug therapy: Secondary | ICD-10-CM | POA: Insufficient documentation

## 2020-09-07 DIAGNOSIS — Z794 Long term (current) use of insulin: Secondary | ICD-10-CM | POA: Insufficient documentation

## 2020-09-07 DIAGNOSIS — Z8673 Personal history of transient ischemic attack (TIA), and cerebral infarction without residual deficits: Secondary | ICD-10-CM | POA: Insufficient documentation

## 2020-09-07 DIAGNOSIS — Z20822 Contact with and (suspected) exposure to covid-19: Secondary | ICD-10-CM | POA: Insufficient documentation

## 2020-09-07 DIAGNOSIS — E1110 Type 2 diabetes mellitus with ketoacidosis without coma: Secondary | ICD-10-CM | POA: Diagnosis not present

## 2020-09-07 DIAGNOSIS — Z951 Presence of aortocoronary bypass graft: Secondary | ICD-10-CM | POA: Insufficient documentation

## 2020-09-07 DIAGNOSIS — I132 Hypertensive heart and chronic kidney disease with heart failure and with stage 5 chronic kidney disease, or end stage renal disease: Secondary | ICD-10-CM | POA: Diagnosis not present

## 2020-09-07 DIAGNOSIS — Z7982 Long term (current) use of aspirin: Secondary | ICD-10-CM | POA: Diagnosis not present

## 2020-09-07 DIAGNOSIS — E1122 Type 2 diabetes mellitus with diabetic chronic kidney disease: Secondary | ICD-10-CM | POA: Insufficient documentation

## 2020-09-07 DIAGNOSIS — Z7984 Long term (current) use of oral hypoglycemic drugs: Secondary | ICD-10-CM | POA: Insufficient documentation

## 2020-09-07 LAB — COMPREHENSIVE METABOLIC PANEL WITH GFR
ALT: 19 U/L (ref 0–44)
AST: 26 U/L (ref 15–41)
Albumin: 3.5 g/dL (ref 3.5–5.0)
Alkaline Phosphatase: 90 U/L (ref 38–126)
Anion gap: 15 (ref 5–15)
BUN: 24 mg/dL — ABNORMAL HIGH (ref 8–23)
CO2: 25 mmol/L (ref 22–32)
Calcium: 9.4 mg/dL (ref 8.9–10.3)
Chloride: 96 mmol/L — ABNORMAL LOW (ref 98–111)
Creatinine, Ser: 4.53 mg/dL — ABNORMAL HIGH (ref 0.44–1.00)
GFR, Estimated: 10 mL/min — ABNORMAL LOW
Glucose, Bld: 253 mg/dL — ABNORMAL HIGH (ref 70–99)
Potassium: 3.6 mmol/L (ref 3.5–5.1)
Sodium: 136 mmol/L (ref 135–145)
Total Bilirubin: 0.8 mg/dL (ref 0.3–1.2)
Total Protein: 7 g/dL (ref 6.5–8.1)

## 2020-09-07 LAB — CBC
HCT: 39.7 % (ref 36.0–46.0)
Hemoglobin: 12.1 g/dL (ref 12.0–15.0)
MCH: 24.7 pg — ABNORMAL LOW (ref 26.0–34.0)
MCHC: 30.5 g/dL (ref 30.0–36.0)
MCV: 81 fL (ref 80.0–100.0)
Platelets: 263 10*3/uL (ref 150–400)
RBC: 4.9 MIL/uL (ref 3.87–5.11)
RDW: 18.7 % — ABNORMAL HIGH (ref 11.5–15.5)
WBC: 4 10*3/uL (ref 4.0–10.5)
nRBC: 0 % (ref 0.0–0.2)

## 2020-09-07 LAB — PROTIME-INR
INR: 1 (ref 0.8–1.2)
Prothrombin Time: 13.1 seconds (ref 11.4–15.2)

## 2020-09-07 LAB — POC SARS CORONAVIRUS 2 AG -  ED: SARS Coronavirus 2 Ag: NEGATIVE

## 2020-09-07 LAB — LIPASE, BLOOD: Lipase: 24 U/L (ref 11–51)

## 2020-09-07 MED ORDER — HYDRALAZINE HCL 25 MG PO TABS
50.0000 mg | ORAL_TABLET | Freq: Two times a day (BID) | ORAL | Status: DC
Start: 2020-09-07 — End: 2020-09-08
  Administered 2020-09-07: 50 mg via ORAL
  Filled 2020-09-07: qty 2

## 2020-09-07 MED ORDER — FENTANYL CITRATE (PF) 100 MCG/2ML IJ SOLN
50.0000 ug | INTRAMUSCULAR | Status: DC | PRN
  Administered 2020-09-07: 50 ug via INTRAVENOUS
  Filled 2020-09-07: qty 2

## 2020-09-07 MED ORDER — ONDANSETRON 4 MG PO TBDP: 4.0000 mg | ORAL_TABLET | Freq: Three times a day (TID) | ORAL | 0 refills | Status: AC | PRN

## 2020-09-07 MED ORDER — ONDANSETRON HCL 4 MG/2ML IJ SOLN
4.0000 mg | Freq: Once | INTRAMUSCULAR | Status: AC
  Administered 2020-09-07: 4 mg via INTRAVENOUS
  Filled 2020-09-07: qty 2

## 2020-09-07 MED ORDER — CARVEDILOL 12.5 MG PO TABS
25.0000 mg | ORAL_TABLET | Freq: Two times a day (BID) | ORAL | Status: DC
  Administered 2020-09-07: 25 mg via ORAL
  Filled 2020-09-07: qty 2

## 2020-09-07 MED ORDER — AMLODIPINE BESYLATE 5 MG PO TABS
10.0000 mg | ORAL_TABLET | Freq: Every day | ORAL | Status: DC
  Administered 2020-09-07: 10 mg via ORAL
  Filled 2020-09-07: qty 2

## 2020-09-07 MED ORDER — SODIUM CHLORIDE 0.9 % IV SOLN: INTRAVENOUS | Status: DC

## 2020-09-07 MED ORDER — FENTANYL CITRATE (PF) 100 MCG/2ML IJ SOLN: 50.0000 ug | Freq: Once | INTRAMUSCULAR | Status: DC

## 2020-09-07 NOTE — ED Triage Notes (Signed)
C/o lower abd pain x vomiting x 2-3 days.

## 2020-09-07 NOTE — ED Notes (Signed)
Patient transported to CT 

## 2020-09-07 NOTE — ED Provider Notes (Signed)
[facility] EMERGENCY DEPARTMENT Provider Note   CSN: [id number] Arrival date & time: 09/07/20  1438     History Chief Complaint  Patient presents with  . Abdominal Pain    Amber Young is a 65 y.o. female.  HPI   This patient is a 65 year old female, she has multiple medical problems including a history of end-stage renal disease, she has been on dialysis for approximately 1 year according to the husband who is the primary caregiver.  She has a history of a prior stroke, paroxysmal atrial fibrillation, chronic kidney disease stage IV currently on dialysis, congestive heart failure which is combined with diastolic and systolic.  She also has hypertension which is poorly controlled and according to the husband she has some intermittent issues with her cognition.  He reports that over the last several days she has been complaining of nausea, she has had some vomiting, going on for 2 or 3 days and is now starting to complain of some lower abdominal pain.  When I asked the patient if she hurts she says no, when I asked the husband he states she has had intermittent pain she has been complaining about.  There has been no fevers chills coughing or shortness of breath according to the husband.  There has been no swelling or rashes.  She missed dialysis 1 day this week but made up for it by going on both Thursday and Friday.  Level 5 caveat applies secondary to the difficulty of the patient giving history  Past Medical History:  Diagnosis Date  . Anemia   . Atrial fibrillation ([place]) 06/17/2019  . Cataract, bilateral   . Chronic combined systolic and diastolic CHF (congestive heart failure) ([place])    a. EF 40-45% in 2019 b. EF 25-30% by repeat echo in 01/2019  . CKD (chronic kidney disease), stage IV ([place])   . Coronary artery disease 08/2017   DES x 2 RCA  . Dialysis patient ([place]) 07/2019  . DKA (diabetic ketoacidoses) 06/16/2019  . GERD (gastroesophageal reflux disease)    . Glaucoma, left eye   . Heart murmur   . Hyperlipidemia   . Hypertension   . Proliferative diabetic retinopathy ([place])    left eye with vitreous hemorrhage and tractional retinal detachment  . Stroke ([place]) 09/2014   numbness left upper lip, finger tips on left hand; "resolved" (05/18/2016)  . Type II diabetes mellitus ([place])   . Wears glasses     Patient Active Problem List   Diagnosis Date Noted  . Hemodialysis patient ([place]) 08/17/2019  . Malnutrition of moderate degree 07/12/2019  . CKD (chronic kidney disease), stage V ([place])   . Chronic combined systolic and diastolic heart failure ([place]) 06/29/2019  . DKA (diabetic ketoacidoses) 06/16/2019  . AKI (acute kidney injury) ([place]) 06/16/2019  . Acute metabolic encephalopathy [id number]  . Acute encephalopathy 06/16/2019  . Hemoglobin A1C greater than 9%, indicating poor diabetic control 02/06/2019  . Glaucoma of left eye 01/23/2019  . Diabetic retinopathy of both eyes with macular edema associated with type 2 diabetes mellitus ([place]) 01/23/2019  . Cataract of both eyes 01/23/2019  . Intractable headache 01/23/2019  . Dizziness 01/23/2019  . Type 2 diabetes mellitus without complication, with long-term current use of insulin ([place]) 12/01/2018  . Left breast mass 12/01/2018  . Edema, peripheral 12/01/2018  . Visual problems 12/01/2018  . Recent weight loss 12/01/2018  . Essential hypertension   . Coronary artery disease involving native coronary artery of native heart  with unstable angina pectoris ([place])   . Chronic anticoagulation 01/06/2017  . Breast abscess 12/20/2016  . Encounter for therapeutic drug monitoring 05/29/2016  . CAD S/P PCI mLAD DES (Promus 3.0 x 24 -- 3.25 mm) 05/19/2016  . PAF (paroxysmal atrial fibrillation) ([place]) 05/19/2016  . Cardiomyopathy, ischemic     Class: Diagnosis of  . Microcytic anemia 04/26/2016  . Acute on chronic combined systolic (congestive) and diastolic (congestive) heart failure ([place])  11/14/2015  . Hypokalemia 11/14/2015  . Uncontrolled hypertension 09/30/2014  . Uncontrolled diabetes mellitus ([place]) 09/30/2014  . History of stroke 09/30/2014    Past Surgical History:  Procedure Laterality Date  . AV FISTULA PLACEMENT Left 07/10/2019   Procedure: ARTERIOVENOUS (AV) FISTULA CREATION;  Surgeon: Angelia Mould, MD;  Location: [facility];  Service: Vascular;  Laterality: Left;  . CARDIAC CATHETERIZATION N/A 05/18/2016   Procedure: Left Heart Cath and Coronary Angiography;  Surgeon: Jettie Booze, MD;  Location: [facility];  Service: Cardiovascular;  Laterality: N/A;  . CARDIAC CATHETERIZATION N/A 05/18/2016   Procedure: Coronary Stent Intervention;  Surgeon: Jettie Booze, MD;  Location: [facility];  Service: Cardiovascular;  Laterality: N/A;  . CATARACT EXTRACTION Right 2020  . COLONOSCOPY W/ BIOPSIES AND POLYPECTOMY    . CORONARY ANGIOPLASTY    . CORONARY ANGIOPLASTY WITH STENT PLACEMENT    . CORONARY STENT INTERVENTION N/A 09/07/2017   Procedure: CORONARY STENT INTERVENTION;  Surgeon: Leonie Man, MD;  Location: [facility];  Service: Cardiovascular;  Laterality: N/A;  . EYE SURGERY    . GAS INSERTION Left 01/13/2018   Procedure: INSERTION OF GAS;  Surgeon: Bernarda Caffey, MD;  Location: [facility];  Service: Ophthalmology;  Laterality: Left;  . INSERTION OF DIALYSIS CATHETER Right 07/10/2019   Procedure: INSERTION OF TUNNELED DIALYSIS CATHETER;  Surgeon: Angelia Mould, MD;  Location: [facility];  Service: Vascular;  Laterality: Right;  . IR FLUORO GUIDE CV LINE RIGHT  07/05/2019  . IR US GUIDE VASC ACCESS RIGHT  07/05/2019  . LEFT HEART CATH AND CORONARY ANGIOGRAPHY N/A 09/07/2017   Procedure: LEFT HEART CATH AND CORONARY ANGIOGRAPHY;  Surgeon: Leonie Man, MD;  Location: [facility];  Service: Cardiovascular;  Laterality: N/A;  . MEMBRANE PEEL Left 01/13/2018   Procedure: MEMBRANE PEEL LEFT EYE ;  Surgeon: Bernarda Caffey,  MD;  Location: [facility];  Service: Ophthalmology;  Laterality: Left;  Marland Kitchen MULTIPLE TOOTH EXTRACTIONS    . PARS PLANA VITRECTOMY Left 01/13/2018   Procedure: PARS PLANA VITRECTOMY WITH 25 GAUGE LEFT EYE WITH ENDOLASER;  Surgeon: Bernarda Caffey, MD;  Location: [facility];  Service: Ophthalmology;  Laterality: Left;  . TUBAL LIGATION  1980  . VAGINAL HYSTERECTOMY  1999   "partial; fibroids"     OB History    Gravida  2   Para      Term      Preterm      AB      Living  2     SAB      IAB      Ectopic      Multiple      Live Births              Family History  Problem Relation Age of Onset  . Diabetes Mother   . Hypertension Mother   . COPD Mother   . Heart failure Mother   . Hypertension Sister   . Hypertension Brother  Social History   Tobacco Use  . Smoking status: Never Smoker  . Smokeless tobacco: Never Used  Vaping Use  . Vaping Use: Never used  Substance Use Topics  . Alcohol use: No    Alcohol/week: 0.0 standard drinks  . Drug use: No    Home Medications Prior to Admission medications   Medication Sig Start Date End Date Taking? Authorizing Provider  ondansetron (ZOFRAN ODT) 4 MG disintegrating tablet Take 1 tablet (4 mg total) by mouth every 8 (eight) hours as needed for nausea or vomiting. 09/07/20  Yes Noemi Chapel, MD  acetaminophen (TYLENOL) 500 MG tablet Take 2 tablets (1,000 mg total) by mouth every 6 (six) hours as needed for mild pain or headache. 07/10/19   Azzie Glatter, FNP  ALPRAZolam Duanne Moron) 0.25 MG tablet Take 1 tablet (0.25 mg total) by mouth 2 (two) times daily as needed for anxiety. Patient not taking: Reported on 06/28/2020 07/13/19   Swayze, Ava, DO  amLODipine (NORVASC) 10 MG tablet TAKE 1 TABLET (10 MG TOTAL) BY MOUTH DAILY. 08/09/19   Azzie Glatter, FNP  aspirin EC 81 MG tablet Take 81 mg by mouth daily.    [provider]  atorvastatin (LIPITOR) 40 MG tablet TAKE 1 TABLET BY MOUTH DAILY AT 6PM 07/23/20   Azzie Glatter, FNP  brimonidine (ALPHAGAN) 0.2 % ophthalmic solution Place 1 drop into the left eye 2 (two) times daily. 04/28/19   [provider]  carvedilol (COREG) 25 MG tablet TAKE 1 TABLET BY MOUTH TWO TIMES DAILY 07/23/20   Azzie Glatter, FNP  dorzolamide-timolol (COSOPT) 22.3-6.8 MG/ML ophthalmic solution Place 1 drop into the left eye 2 (two) times daily. 04/28/19   [provider]  feeding supplement, ENSURE ENLIVE, (ENSURE ENLIVE) LIQD Take 237 mLs by mouth 2 (two) times daily between meals. 07/14/19   Swayze, Ava, DO  ferric citrate (AURYXIA) 1 GM 210 MG(Fe) tablet Take by mouth. 09/01/19   [provider]  ferrous sulfate (FERROUSUL) 325 (65 FE) MG tablet Take 1 tablet (325 mg total) by mouth every other day. 06/27/19   Azzie Glatter, FNP  glipiZIDE (GLUCOTROL) 10 MG tablet TAKE 1 TABLET BY MOUTH TWO TIMES DAILY BEFORE MEALS 07/23/20   Azzie Glatter, FNP  hydrALAZINE (APRESOLINE) 50 MG tablet Take 1 tablet (50 mg total) by mouth 2 (two) times daily. 07/25/19 06/28/20  Skeet Latch, MD  Insulin Glargine (LANTUS) 100 UNIT/ML Solostar Pen Inject 4 Units into the skin daily. Patient not taking: Reported on 06/28/2020 06/27/19   Azzie Glatter, FNP  Insulin Pen Needle (TRUEPLUS PEN NEEDLES) 31G X 6 MM MISC USE AS DIRECTED 06/27/19   Azzie Glatter, FNP  isosorbide mononitrate (IMDUR) 30 MG 24 hr tablet Take 1 tablet (30 mg total) by mouth daily. 07/14/19   Swayze, Ava, DO  ketorolac (ACULAR) 0.4 % SOLN Place 1 drop into the right eye 4 (four) times daily. 04/28/19   [provider]  multivitamin (RENA-VIT) TABS tablet Take 1 tablet by mouth at bedtime. 07/13/19   Swayze, Ava, DO  nitroGLYCERIN (NITROSTAT) 0.4 MG SL tablet Place 1 tablet (0.4 mg total) under the tongue every 5 (five) minutes x 3 doses as needed for chest pain. 06/27/19   Azzie Glatter, FNP  ofloxacin (OCUFLOX) 0.3 % ophthalmic solution Place 1 drop into the right eye 3  (three) times daily. 04/28/19   [provider]  warfarin (COUMADIN) 5 MG tablet TAKE 1 TABLET BY MOUTH  4 TIMES A WEEK ON TUESDAY, THURSDAY, SATURDAY, AND SUNDAY. (TAKE 7.'5MG'$  ON MONDAY, WEDNESDAY, AND FRIDAY.) 09/03/20   Azzie Glatter, FNP    Allergies    Ace inhibitors  Review of Systems   Review of Systems  Unable to perform ROS: Mental status change    Physical Exam Updated Vital Signs BP (!) 171/75   Pulse 72   Temp 98.2 F (36.8 C)   Resp (!) 24   SpO2 100%   Physical Exam Vitals and nursing note reviewed.  Constitutional:      General: She is not in acute distress.    Appearance: She is well-developed and well-nourished.  HENT:     Head: Normocephalic and atraumatic.     Mouth/Throat:     Mouth: Oropharynx is clear and moist.     Pharynx: No oropharyngeal exudate.  Eyes:     General: No scleral icterus.       Right eye: No discharge.        Left eye: No discharge.     Extraocular Movements: EOM normal.     Conjunctiva/sclera: Conjunctivae normal.     Pupils: Pupils are equal, round, and reactive to light.  Neck:     Thyroid: No thyromegaly.     Vascular: No JVD.  Cardiovascular:     Rate and Rhythm: Normal rate and regular rhythm.     Pulses: Intact distal pulses.     Heart sounds: Normal heart sounds. No murmur heard. No friction rub. No gallop.      Comments: Occasional ectopy present, normal pulses at the left wrist, normal thrill in the fistula of the left upper extremity Pulmonary:     Effort: Pulmonary effort is normal. No respiratory distress.     Breath sounds: Normal breath sounds. No wheezing or rales.  Abdominal:     General: Bowel sounds are normal. There is no distension.     Palpations: Abdomen is soft. There is no mass.     Tenderness: There is abdominal tenderness.     Comments: Diffusely very soft abdomen but tenderness to the lower abdomen, no guarding, no masses  Musculoskeletal:        General: No tenderness or edema. Normal  range of motion.     Cervical back: Normal range of motion and neck supple.  Lymphadenopathy:     Cervical: No cervical adenopathy.  Skin:    General: Skin is warm and dry.     Findings: No erythema or rash.  Neurological:     Mental Status: She is alert.     Coordination: Coordination normal.     Comments: The patient moans from time to time, she will not answer questions directly, she is able to follow some simple commands like moving arms opening eyes, she will not answer questions with words  Psychiatric:        Mood and Affect: Mood and affect normal.     ED Results / Procedures / Treatments   Labs (all labs ordered are listed, but only abnormal results are displayed) Labs Reviewed  COMPREHENSIVE METABOLIC PANEL - Abnormal; Notable for the following components:      Result Value   Chloride 96 (*)    Glucose, Bld 253 (*)    BUN 24 (*)    Creatinine, Ser 4.53 (*)    GFR, Estimated 10 (*)    All other components within normal limits  CBC - Abnormal; Notable for the following components:   MCH 24.7 (*)  RDW 18.7 (*)    All other components within normal limits  LIPASE, BLOOD  PROTIME-INR  URINALYSIS, ROUTINE W REFLEX MICROSCOPIC  POC SARS CORONAVIRUS 2 AG -  ED    EKG EKG Interpretation  Date/Time:  Saturday September 07 2020 17:39:31 EST Ventricular Rate:  95 PR Interval:    QRS Duration: 92 QT Interval:  414 QTC Calculation: 521 R Axis:   39 Text Interpretation: Sinus rhythm Atrial premature complexes Probable left atrial enlargement LVH with secondary repolarization abnormality Anterior Q waves, possibly due to LVH Prolonged QT interval Since last tracing qt prolonged and ST segments slightly abnormal diffusely Confirmed by Noemi Chapel [phone]) on 09/07/2020 5:50:22 PM   Radiology CT ABDOMEN PELVIS WO CONTRAST  Result Date: 09/07/2020 CLINICAL DATA:  Lower abdominal pain and vomiting for the past 3 days. EXAM: CT ABDOMEN AND PELVIS WITHOUT CONTRAST TECHNIQUE:  Multidetector CT imaging of the abdomen and pelvis was performed following the standard protocol without IV contrast. COMPARISON:  None. FINDINGS: Lower chest: Enlarged heart. Small pericardial effusion with a maximum thickness of 1.3 cm. Mild patchy interstitial opacity at both lung bases with a ground-glass pattern. No pleural fluid. Hepatobiliary: Sludge and multiple small dependent gallstones in the gallbladder. No single stone is identifiable to measure. No gallbladder wall thickening or pericholecystic fluid. 1.0 cm low density mass in the liver at the junction of the caudate lobe with the remainder of the liver. This is best defined on coronal image number 44 series 6. Pancreas: Unremarkable. No pancreatic ductal dilatation or surrounding inflammatory changes. Spleen: Normal in size without focal abnormality. Adrenals/Urinary Tract: Upper pole parapelvic cyst in the right kidney. Unremarkable adrenal glands, left kidney, ureters and urinary bladder. Stomach/Bowel: Small hiatal hernia. Appendix appears normal. No evidence of bowel wall thickening, distention, or inflammatory changes. Vascular/Lymphatic: Extensive atheromatous arterial calcifications without aneurysm. No enlarged lymph nodes. Reproductive: Status post hysterectomy. No adnexal masses. Other: No abdominal wall hernia or abnormality. No abdominopelvic ascites. Musculoskeletal: L5-S1 facet degenerative changes. IMPRESSION: 1. No acute abnormality. 2. Sludge and multiple small dependent gallstones in the gallbladder without evidence of cholecystitis. 3. 1.0 cm low density mass in the liver at the junction of the caudate lobe with the remainder of the liver. This most likely represents a small cyst. A small hemangioma or other mass are less likely. This could be further evaluated with a limited right upper quadrant abdomen ultrasound. 4. Small hiatal hernia. 5. Cardiomegaly and small pericardial effusion. 6. Mild patchy interstitial opacity at both  lung bases with a ground-glass pattern. This could be due to interstitial pulmonary edema, pneumonitis or chronic interstitial lung disease. Electronically Signed   By: Claudie Revering M.D.   On: 09/07/2020 18:46   DG Chest Port 1 View  Result Date: 09/07/2020 CLINICAL DATA:  Shortness of breath EXAM: PORTABLE CHEST 1 VIEW COMPARISON:  July 10, 2019 FINDINGS: The heart size is enlarged. Aortic calcifications are noted. There is no pneumothorax or large pleural effusion. No focal infiltrate. IMPRESSION: Cardiomegaly without edema or pleural effusion. Electronically Signed   By: Constance Holster M.D.   On: 09/07/2020 20:43    Procedures Procedures   Medications Ordered in ED Medications  0.9 %  sodium chloride infusion ( Intravenous New Bag/Given 09/07/20 1857)  amLODipine (NORVASC) tablet 10 mg (10 mg Oral Given 09/07/20 1903)  carvedilol (COREG) tablet 25 mg (25 mg Oral Given 09/07/20 1929)  hydrALAZINE (APRESOLINE) tablet 50 mg (50 mg Oral Given 09/07/20 1903)  fentaNYL (SUBLIMAZE) injection 50  mcg (50 mcg Intravenous Given 09/07/20 1903)  ondansetron (ZOFRAN) injection 4 mg (4 mg Intravenous Given 09/07/20 1903)    ED Course  I have reviewed the triage vital signs and the nursing notes.  Pertinent labs & imaging results that were available during my care of the patient were reviewed by me and considered in my medical decision making (see chart for details).    MDM Rules/Calculators/A&P                          The patient's laboratory work-up shows a creatinine of 4.5, this is chronic, BUN of 24 glucose of 250 and a normal CO2 at 25.  Her CBC shows no leukocytosis, no anemia and normal platelets.  The EKG has some abnormalities, mostly QT prolongation though with her irregular rhythm it does not appear to be that prolonged.  The PR interval is normal, the QRS is slightly widened in a nonspecific intraventricular conduction delay.  There is some poor R wave progression, there is no ST  elevation but some nonspecific ST depression.  When I compare this to old EKGs it is relatively unchanged   CT negative, Pt improved - meds given for nausea Pt has less abd pain - agreeable for d/c.   Final Clinical Impression(s) / ED Diagnoses Final diagnoses:  Generalized abdominal pain  Nausea and vomiting, intractability of vomiting not specified, unspecified vomiting type    Rx / DC Orders ED Discharge Orders         Ordered    ondansetron (ZOFRAN ODT) 4 MG disintegrating tablet  Every 8 hours PRN        09/07/20 2314           Noemi Chapel, MD 09/07/20 2318

## 2020-09-07 NOTE — Discharge Instructions (Signed)
Zofran as needed for nausea YOur CT scan of the abdomen showed no acute findings Your covid test is negative ER for worsening symptoms.

## 2020-09-08 NOTE — ED Notes (Signed)
Called [facility] to take patient home

## 2020-10-21 ENCOUNTER — Other Ambulatory Visit: Payer: Self-pay | Admitting: Cardiovascular Disease

## 2020-11-13 ENCOUNTER — Ambulatory Visit: Payer: Medicare Other | Admitting: Family Medicine

## 2020-11-26 ENCOUNTER — Other Ambulatory Visit (HOSPITAL_COMMUNITY): Payer: Self-pay

## 2020-11-26 MED ORDER — DOXYCYCLINE HYCLATE 100 MG PO CAPS
100.0000 mg | ORAL_CAPSULE | Freq: Two times a day (BID) | ORAL | 0 refills | Status: AC
  Filled 2020-11-26: qty 14, 7d supply, fill #0

## 2020-12-04 ENCOUNTER — Other Ambulatory Visit (HOSPITAL_COMMUNITY): Payer: Self-pay

## 2020-12-04 IMAGING — [facility] DIGITAL DIAGNOSTIC UNILATERAL LEFT MAMMOGRAM WITH TOMO AND CAD
6 series · 6 of 18 positions shown · non-contrast
Comparison: Previous exam(s).

CLINICAL DATA: 62-year-old patient with palpable lump in the
inferior left breast. The patient has not been able to inspect the
area visually. She does have a history of cutaneous boils per her
report and shows me scars at the site of two prior boils on the
chest and upper abdomen.

EXAM:
DIGITAL DIAGNOSTIC LEFT MAMMOGRAM WITH CAD AND TOMO
ULTRASOUND LEFT BREAST

[L TAN synth-2D]
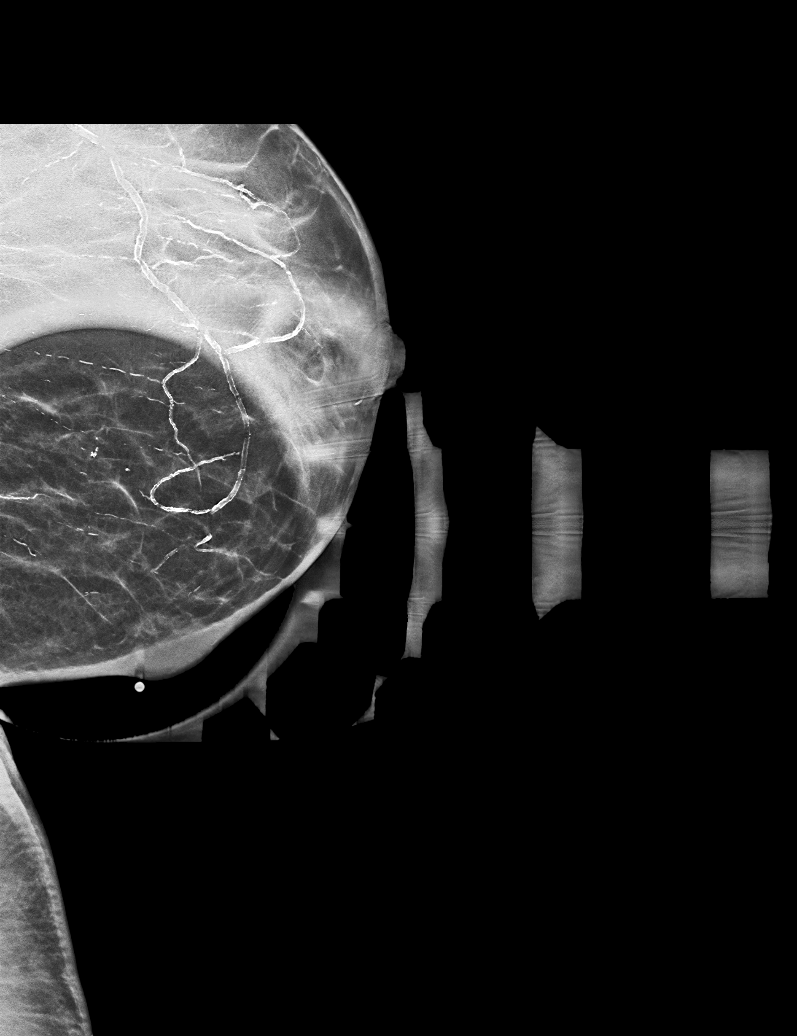

[L MLO synth-2D]
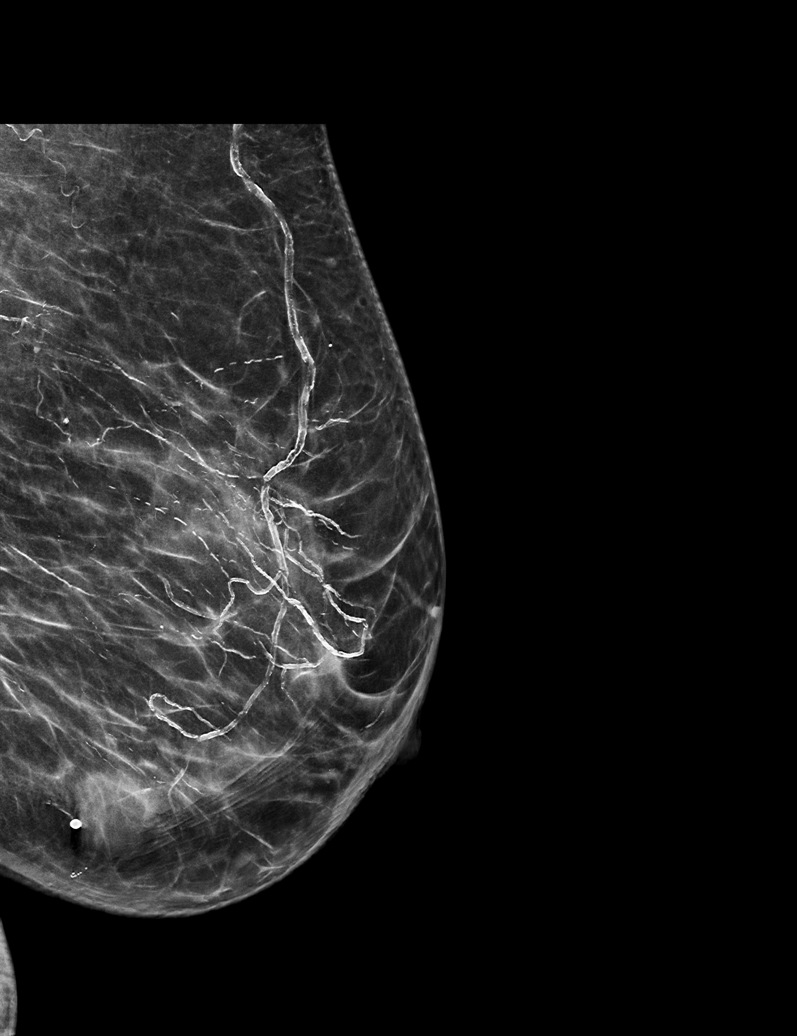

[L CC synth-2D]
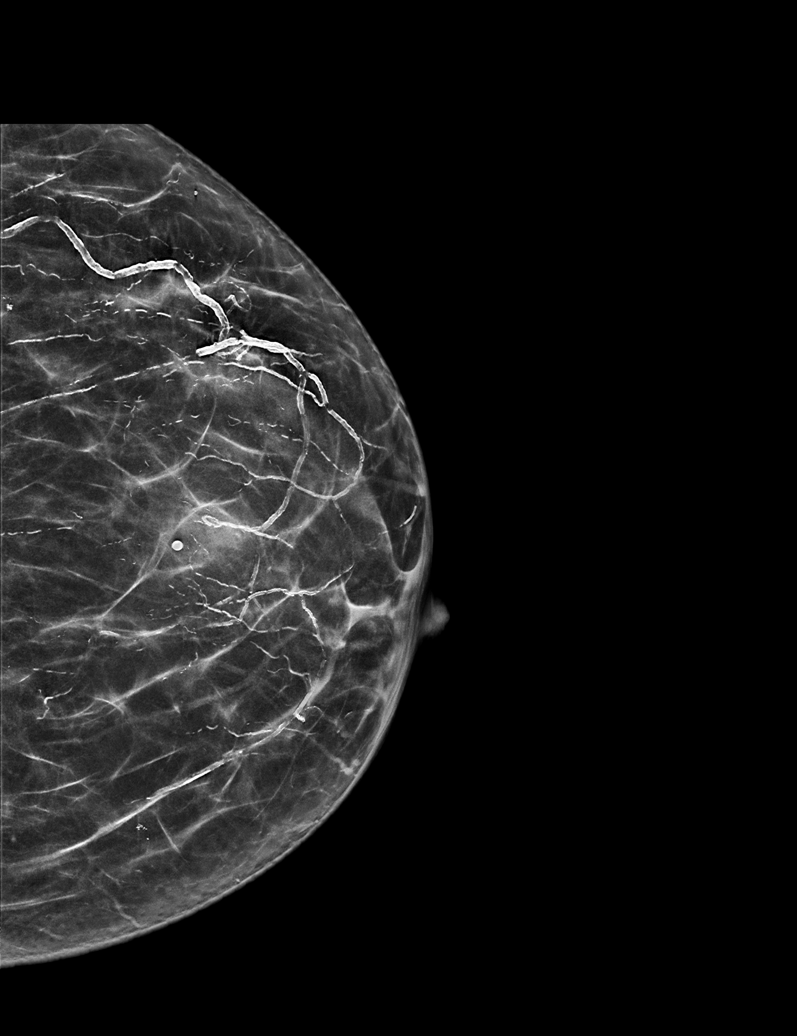

[L MLO tomo · tomo slice 31/62.0]
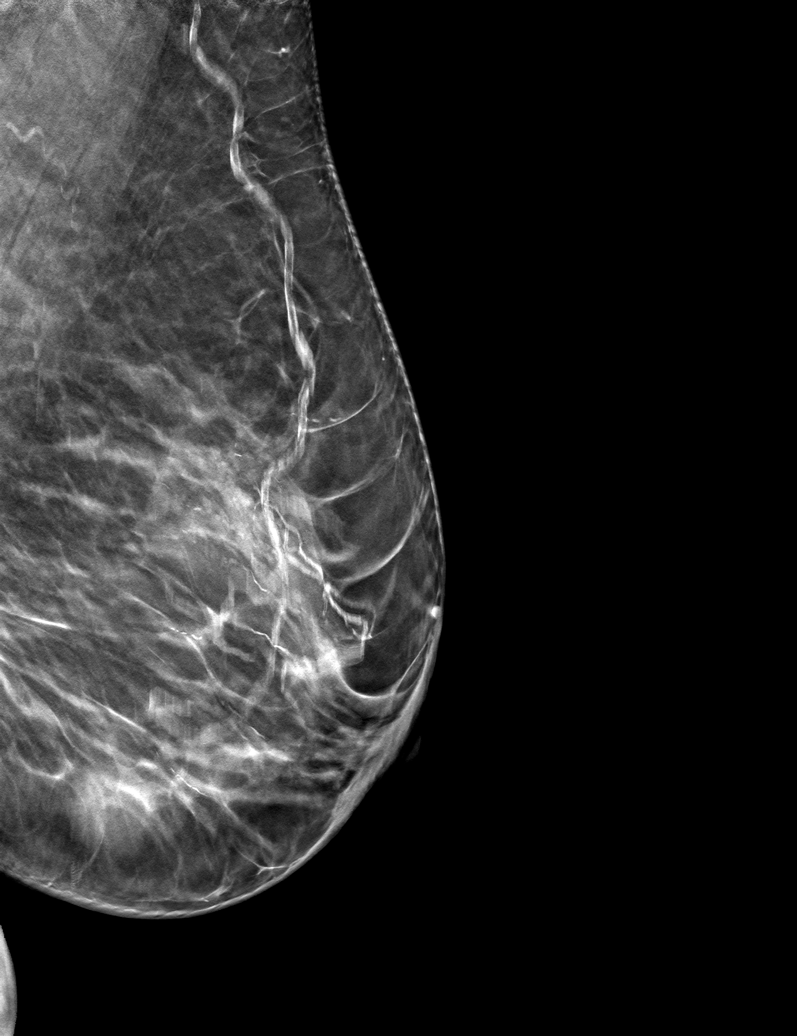

[L TAN tomo · tomo slice 22/43.0]
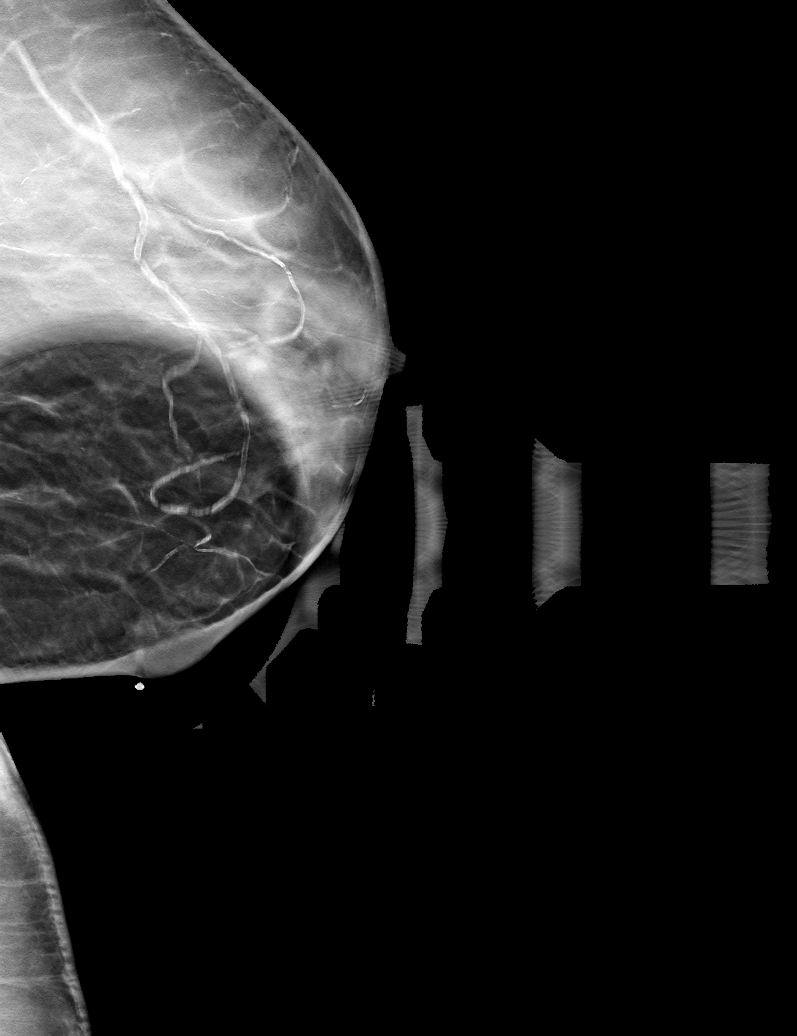

[L CC tomo · tomo slice 27/54.0]
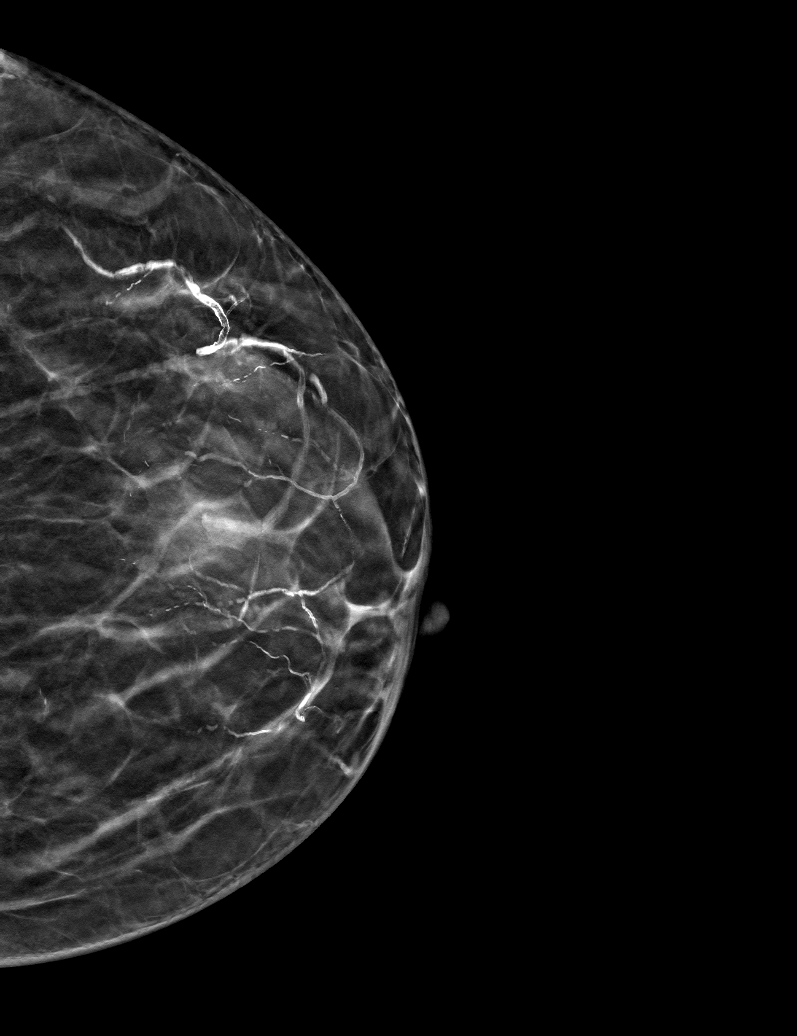

[6 of 18 positions shown; findings below may reference images not displayed]

ACR Breast Density Category b: There are scattered areas of
fibroglandular density.
FINDINGS: Heavy atherosclerotic vascular calcifications. There is focal skin
thickening in the inferior left breast at the site of the palpable
lump, well seen on a spot tangential view. Otherwise, the breast is
negative. No suspicious microcalcification or architectural
distortion.

Mammographic images were processed with CAD.

On physical exam, there is a visible protrusion of the skin surface
of the 5 o'clock region of the left breast approximately 4 cm from
the nipple. A single drop of white fluid rests on the palpable lump
when I saw the patient.

Targeted ultrasound is performed, showing a complex fluid collection
centered within the dermis at 5 o'clock position 4 cm from nipple,
measuring 1.8 x 2.0 x 1.2 cm, with some peripheral vascularity
suggesting inflammation. There is a hypoechoic tract extending
toward the skin surface from the fluid collection.
IMPRESSION: The visible and palpable inferior left breast mass has imaging
findings most consistent with a prominent sebaceous cyst or
epidermal inclusion cyst. A small amount of white fluid is present
on the skin surface.

No evidence of malignancy in the left breast.

Heavy atherosclerotic vascular calcifications.

RECOMMENDATION:
Clinical follow-up for prominent sebaceous cyst.

Annual screening mammography is recommended in December 2018.

I have discussed the findings and recommendations with the patient.
Results were also provided in writing at the conclusion of the
visit. If applicable, a reminder letter will be sent to the patient
regarding the next appointment.

BI-RADS CATEGORY  2: Benign.

## 2020-12-24 ENCOUNTER — Ambulatory Visit: Payer: Medicare Other | Admitting: Family Medicine

## 2021-01-17 ENCOUNTER — Telehealth: Payer: Self-pay | Admitting: Nurse Practitioner

## 2021-01-17 NOTE — Telephone Encounter (Signed)
Pt was called  to schedule her AWV no VM was left no one ever answered

## 2021-02-03 MED FILL — Warfarin Sodium Tab 5 MG: ORAL | 44 days supply | Qty: 78 | Fill #0 | Status: AC

## 2021-02-03 MED FILL — Hydralazine HCl Tab 50 MG: ORAL | 30 days supply | Qty: 60 | Fill #0 | Status: AC

## 2021-02-03 MED FILL — Atorvastatin Calcium Tab 40 MG (Base Equivalent): ORAL | 90 days supply | Qty: 90 | Fill #0 | Status: AC

## 2021-02-04 ENCOUNTER — Other Ambulatory Visit: Payer: Self-pay

## 2021-02-07 ENCOUNTER — Other Ambulatory Visit: Payer: Self-pay

## 2021-02-20 ENCOUNTER — Other Ambulatory Visit: Payer: Self-pay

## 2021-02-20 DIAGNOSIS — N185 Chronic kidney disease, stage 5: Secondary | ICD-10-CM

## 2021-02-27 ENCOUNTER — Encounter (HOSPITAL_COMMUNITY): Payer: Medicare Other

## 2021-02-27 ENCOUNTER — Ambulatory Visit: Payer: Medicare Other | Admitting: Vascular Surgery

## 2021-02-27 ENCOUNTER — Other Ambulatory Visit (HOSPITAL_COMMUNITY): Payer: Medicare Other

## 2021-03-20 ENCOUNTER — Ambulatory Visit (INDEPENDENT_AMBULATORY_CARE_PROVIDER_SITE_OTHER)
Admission: RE | Admit: 2021-03-20 | Discharge: 2021-03-20 | Disposition: A | Discharge status: Home / Self Care | Payer: Medicare Other | Source: Ambulatory Visit | Attending: Vascular Surgery | Admitting: Vascular Surgery

## 2021-03-20 ENCOUNTER — Encounter: Payer: Self-pay | Admitting: Vascular Surgery

## 2021-03-20 ENCOUNTER — Other Ambulatory Visit: Payer: Self-pay

## 2021-03-20 ENCOUNTER — Ambulatory Visit (HOSPITAL_COMMUNITY)
Admission: RE | Admit: 2021-03-20 | Discharge: 2021-03-20 | Disposition: A | Discharge status: Home / Self Care | Payer: Medicare Other | Source: Ambulatory Visit | Attending: Vascular Surgery | Admitting: Vascular Surgery

## 2021-03-20 ENCOUNTER — Ambulatory Visit (INDEPENDENT_AMBULATORY_CARE_PROVIDER_SITE_OTHER): Payer: Medicare Other | Admitting: Vascular Surgery

## 2021-03-20 VITALS — BP 182/85 | HR 71 | Temp 97.7°F | Resp 20 | Ht 67.0 in | Wt 183.0 lb

## 2021-03-20 DIAGNOSIS — Z992 Dependence on renal dialysis: Secondary | ICD-10-CM

## 2021-03-20 DIAGNOSIS — N185 Chronic kidney disease, stage 5: Secondary | ICD-10-CM

## 2021-03-20 DIAGNOSIS — N186 End stage renal disease: Secondary | ICD-10-CM | POA: Diagnosis not present

## 2021-03-20 NOTE — Progress Notes (Signed)
Patient name: Amber Young MRN: [id number] DOB: 20-Sep-1955 Sex: female   HPI: Amber Young is a 65 y.o. female, referred for placement of long-term hemodialysis access.  She previously had a left upper arm AV fistula which is now thrombosed.  Her previous fistula was placed in 2020 by Dr. Scot Dock.  She is on warfarin for atrial fibrillation.  She currently has a left side dialysis catheter.  Her hemodialysis day is Monday Wednesday Friday.  She is left-handed and would prefer to keep the access in her left arm.  As far she knows she has no numbness or tingling in her left hand with her prior access.  Other medical problems include congestive failure, coronary artery disease, hyperlipidemia, hypertension, diabetes, diabetic retinopathy all of which have been stable.  Past Medical History:  Diagnosis Date   Anemia    Atrial fibrillation ([place]) 06/17/2019   Cataract, bilateral    Chronic combined systolic and diastolic CHF (congestive heart failure) ([place])    a. EF 40-45% in 2019 b. EF 25-30% by repeat echo in 01/2019   CKD (chronic kidney disease), stage IV (HCC)    Coronary artery disease 08/2017   DES x 2 RCA   Dialysis patient ([place]) 07/2019   DKA (diabetic ketoacidoses) 06/16/2019   GERD (gastroesophageal reflux disease)    Glaucoma, left eye    Heart murmur    Hyperlipidemia    Hypertension    Proliferative diabetic retinopathy ([place])    left eye with vitreous hemorrhage and tractional retinal detachment   Stroke ([place]) 09/2014   numbness left upper lip, finger tips on left hand; "resolved" (05/18/2016)   Type II diabetes mellitus ([place])    Wears glasses    Past Surgical History:  Procedure Laterality Date   AV FISTULA PLACEMENT Left 07/10/2019   Procedure: ARTERIOVENOUS (AV) FISTULA CREATION;  Surgeon: Angelia Mould, MD;  Location: [place];  Service: Vascular;  Laterality: Left;   CARDIAC CATHETERIZATION N/A 05/18/2016   Procedure: Left Heart Cath and Coronary  Angiography;  Surgeon: Jettie Booze, MD;  Location: [place] [facility];  Service: Cardiovascular;  Laterality: N/A;   CARDIAC CATHETERIZATION N/A 05/18/2016   Procedure: Coronary Stent Intervention;  Surgeon: Jettie Booze, MD;  Location: [place] [facility];  Service: Cardiovascular;  Laterality: N/A;   CATARACT EXTRACTION Right 2020   COLONOSCOPY W/ BIOPSIES AND POLYPECTOMY     CORONARY ANGIOPLASTY     CORONARY ANGIOPLASTY WITH STENT PLACEMENT     CORONARY STENT INTERVENTION N/A 09/07/2017   Procedure: CORONARY STENT INTERVENTION;  Surgeon: Leonie Man, MD;  Location: [place] [facility];  Service: Cardiovascular;  Laterality: N/A;   EYE SURGERY     GAS INSERTION Left 01/13/2018   Procedure: INSERTION OF GAS;  Surgeon: Bernarda Caffey, MD;  Location: [place];  Service: Ophthalmology;  Laterality: Left;   INSERTION OF DIALYSIS CATHETER Right 07/10/2019   Procedure: INSERTION OF TUNNELED DIALYSIS CATHETER;  Surgeon: Angelia Mould, MD;  Location: [facility] OR;  Service: Vascular;  Laterality: Right;   IR FLUORO GUIDE CV LINE RIGHT  07/05/2019   IR US GUIDE VASC ACCESS RIGHT  07/05/2019   LEFT HEART CATH AND CORONARY ANGIOGRAPHY N/A 09/07/2017   Procedure: LEFT HEART CATH AND CORONARY ANGIOGRAPHY;  Surgeon: Leonie Man, MD;  Location: [place] [facility];  Service: Cardiovascular;  Laterality: N/A;   MEMBRANE PEEL Left 01/13/2018   Procedure: MEMBRANE PEEL LEFT EYE ;  Surgeon: Bernarda Caffey, MD;  Location: [facility]  OR;  Service: Ophthalmology;  Laterality: Left;   MULTIPLE TOOTH EXTRACTIONS     PARS PLANA VITRECTOMY Left 01/13/2018   Procedure: PARS PLANA VITRECTOMY WITH 25 GAUGE LEFT EYE WITH ENDOLASER;  Surgeon: Bernarda Caffey, MD;  Location: [place];  Service: Ophthalmology;  Laterality: Left;   [place]   "partial; fibroids"    Family History  Problem Relation Age of Onset   Diabetes Mother    Hypertension Mother    COPD Mother    Heart  failure Mother    Hypertension Sister    Hypertension Brother     SOCIAL HISTORY: Social History   Socioeconomic History   Marital status: Married    Spouse name: Not on file   Number of children: Not on file   Years of education: 16   Highest education level: Not on file  Occupational History   Occupation: hairdresser  Tobacco Use   Smoking status: Never   Smokeless tobacco: Never  Vaping Use   Vaping Use: Never used  Substance and Sexual Activity   Alcohol use: No    Alcohol/week: 0.0 standard drinks   Drug use: No   Sexual activity: Yes  Other Topics Concern   Not on file  Social History Narrative   Married, 2 children, hairdresser   Caffeine use- occasional tea or coffee   Right handed   Social Determinants of Health   Financial Resource Strain: Not on file  Food Insecurity: Not on file  Transportation Needs: Not on file  Physical Activity: Not on file  Stress: Not on file  Social Connections: Not on file  Intimate Partner Violence: Not on file    Allergies  Allergen Reactions   Ace Inhibitors Cough    Current Outpatient Medications  Medication Sig Dispense Refill   acetaminophen (TYLENOL) 500 MG tablet Take 2 tablets (1,000 mg total) by mouth every 6 (six) hours as needed for mild pain or headache. 30 tablet 11   ALPRAZolam (XANAX) 0.25 MG tablet Take 1 tablet (0.25 mg total) by mouth 2 (two) times daily as needed for anxiety. 20 tablet 0   amLODipine (NORVASC) 10 MG tablet TAKE 1 TABLET (10 MG TOTAL) BY MOUTH DAILY. 90 tablet 2   aspirin EC 81 MG tablet Take 81 mg by mouth daily.     atorvastatin (LIPITOR) 40 MG tablet TAKE 1 TABLET BY MOUTH DAILY AT 6PM 90 tablet 1   brimonidine (ALPHAGAN) 0.2 % ophthalmic solution Place 1 drop into the left eye 2 (two) times daily.     carvedilol (COREG) 25 MG tablet TAKE 1 TABLET BY MOUTH TWO TIMES DAILY 60 tablet 3   dorzolamide-timolol (COSOPT) 22.3-6.8 MG/ML ophthalmic solution Place 1 drop into the left eye 2  (two) times daily.     feeding supplement, ENSURE ENLIVE, (ENSURE ENLIVE) LIQD Take 237 mLs by mouth 2 (two) times daily between meals. 237 mL 12   ferric citrate (AURYXIA) 1 GM 210 MG(Fe) tablet Take by mouth.     ferrous sulfate (FERROUSUL) 325 (65 FE) MG tablet Take 1 tablet (325 mg total) by mouth every other day. 90 tablet 1   glipiZIDE (GLUCOTROL) 10 MG tablet TAKE 1 TABLET BY MOUTH TWO TIMES DAILY BEFORE MEALS 60 tablet 3   hydrALAZINE (APRESOLINE) 50 MG tablet TAKE 1 TABLET (50 MG TOTAL) BY MOUTH 2 (TWO) TIMES DAILY. PATIENT NEEDS APPOINTMENT FOR ANY FUTURE REFILLS. 60 tablet 1   Insulin Glargine (LANTUS) 100 UNIT/ML  Solostar Pen Inject 4 Units into the skin daily. 9 mL 1   Insulin Pen Needle (TRUEPLUS PEN NEEDLES) 31G X 6 MM MISC USE AS DIRECTED 300 each 1   isosorbide mononitrate (IMDUR) 30 MG 24 hr tablet Take 1 tablet (30 mg total) by mouth daily. 30 tablet 0   ketorolac (ACULAR) 0.4 % SOLN Place 1 drop into the right eye 4 (four) times daily.     multivitamin (RENA-VIT) TABS tablet Take 1 tablet by mouth at bedtime. 30 tablet 0   nitroGLYCERIN (NITROSTAT) 0.4 MG SL tablet Place 1 tablet (0.4 mg total) under the tongue every 5 (five) minutes x 3 doses as needed for chest pain. 90 tablet 1   ofloxacin (OCUFLOX) 0.3 % ophthalmic solution Place 1 drop into the right eye 3 (three) times daily.     ondansetron (ZOFRAN ODT) 4 MG disintegrating tablet Take 1 tablet (4 mg total) by mouth every 8 (eight) hours as needed for nausea or vomiting. 10 tablet 0   ondansetron (ZOFRAN-ODT) 4 MG disintegrating tablet TAKE 1 TABLET (4 MG TOTAL) BY MOUTH EVERY 8 (EIGHT) HOURS AS NEEDED FOR NAUSEA OR VOMITING. 10 tablet 0   warfarin (COUMADIN) 5 MG tablet TAKE 1 TABLET BY MOUTH 4 TIMES A WEEK ON TUESDAY, THURSDAY, SATURDAY, AND SUNDAY. (TAKE 7.'5MG'$  ON MONDAY, WEDNESDAY, AND FRIDAY.) 78 tablet 1   No current facility-administered medications for this visit.    ROS:   General:  No weight loss, Fever,  chills  HEENT: No recent headaches, no nasal bleeding, no visual changes, no sore throat  Neurologic: No dizziness, blackouts, seizures. No recent symptoms of stroke or mini- stroke. No recent episodes of slurred speech, or temporary blindness.  Cardiac: No recent episodes of chest pain/pressure, no shortness of breath at rest.  No shortness of breath with exertion.  Denies history of atrial fibrillation or irregular heartbeat  Vascular: No history of rest pain in feet.  No history of claudication.  No history of non-healing ulcer, No history of DVT   Pulmonary: No home oxygen, no productive cough, no hemoptysis,  No asthma or wheezing  Musculoskeletal:  '[ ]'$  Arthritis, '[ ]'$  Low back pain,  '[ ]'$  Joint pain  Hematologic:No history of hypercoagulable state.  No history of easy bleeding.  No history of anemia  Gastrointestinal: No hematochezia or melena,  No gastroesophageal reflux, no trouble swallowing  Urinary: '[X]'$  chronic Kidney disease, '[X]'$  on HD - '[X]'$  MWF or '[ ]'$  TTHS, '[ ]'$  Burning with urination, '[ ]'$  Frequent urination, '[ ]'$  Difficulty urinating;   Skin: No rashes  Psychological: No history of anxiety,  No history of depression   Physical Examination  Vitals:   03/20/21 1442  BP: (!) 182/85  Pulse: 71  Resp: 20  Temp: 97.7 F (36.5 C)  SpO2: 97%  Weight: 183 lb (83 kg)  Height: '5\' 7"'$  (1.702 m)    Body mass index is 28.66 kg/m.  General:  Alert and oriented, no acute distress HEENT: Normal Neck: No JVD Pulmonary: Clear to auscultation bilaterally left side HD cath Cardiac: Regular Rate and Rhythm  Skin: No rash Extremity Pulses:  2+ radial, brachial  pulses bilaterally Musculoskeletal: No deformity or edema  Neurologic: Upper and lower extremity motor 5/5 and symmetric  DATA:  Patient had a vein mapping ultrasound today as well as upper extremity arterial duplex exam.  Vein mapping ultrasound showed less than 3 mm vein bilaterally.  She had a 3 mm left brachial  artery a  4 mm right brachial artery no obvious arterial obstruction  ASSESSMENT: Needs long-term hemodialysis access.  She would prefer to stay in the left arm.  We will get her scheduled in the near future on a Tuesday or Thursday for a left upper arm AV graft.  Risk benefits possible complications and procedure details include but not limited to bleeding infection graft thrombosis ischemic steal were all discussed with the patient today.  She understands agrees to proceed.  We will stop her warfarin 3 days prior to procedure.   PLAN: See above   Ruta Hinds, MD [facility] [place] Office: [phone]

## 2021-03-20 NOTE — H&P (View-Only) (Signed)
Patient name: Amber Young MRN: [id number] DOB: [birth year] Sex: female   HPI: Amber Young is a 65 y.o. female, referred for placement of long-term hemodialysis access.  She previously had a left upper arm AV fistula which is now thrombosed.  Her previous fistula was placed in 2020 by Dr. Scot Dock.  She is on warfarin for atrial fibrillation.  She currently has a left side dialysis catheter.  Her hemodialysis day is Monday Wednesday Friday.  She is left-handed and would prefer to keep the access in her left arm.  As far she knows she has no numbness or tingling in her left hand with her prior access.  Other medical problems include congestive failure, coronary artery disease, hyperlipidemia, hypertension, diabetes, diabetic retinopathy all of which have been stable.  Past Medical History:  Diagnosis Date   Anemia    Atrial fibrillation ([place]) 06/17/2019   Cataract, bilateral    Chronic combined systolic and diastolic CHF (congestive heart failure) ([place])    a. EF 40-45% in 2019 b. EF 25-30% by repeat echo in 01/2019   CKD (chronic kidney disease), stage IV (HCC)    Coronary artery disease 08/2017   DES x 2 RCA   Dialysis patient ([place]) 07/2019   DKA (diabetic ketoacidoses) 06/16/2019   GERD (gastroesophageal reflux disease)    Glaucoma, left eye    Heart murmur    Hyperlipidemia    Hypertension    Proliferative diabetic retinopathy ([place])    left eye with vitreous hemorrhage and tractional retinal detachment   Stroke ([place]) 09/2014   numbness left upper lip, finger tips on left hand; "resolved" (05/18/2016)   Type II diabetes mellitus ([place])    Wears glasses    Past Surgical History:  Procedure Laterality Date   AV FISTULA PLACEMENT Left 07/10/2019   Procedure: ARTERIOVENOUS (AV) FISTULA CREATION;  Surgeon: Angelia Mould, MD;  Location: [place];  Service: Vascular;  Laterality: Left;   CARDIAC CATHETERIZATION N/A 05/18/2016   Procedure: Left Heart Cath and Coronary  Angiography;  Surgeon: Jettie Booze, MD;  Location: [place] [facility];  Service: Cardiovascular;  Laterality: N/A;   CARDIAC CATHETERIZATION N/A 05/18/2016   Procedure: Coronary Stent Intervention;  Surgeon: Jettie Booze, MD;  Location: [place] [facility];  Service: Cardiovascular;  Laterality: N/A;   CATARACT EXTRACTION Right 2020   COLONOSCOPY W/ BIOPSIES AND POLYPECTOMY     CORONARY ANGIOPLASTY     CORONARY ANGIOPLASTY WITH STENT PLACEMENT     CORONARY STENT INTERVENTION N/A 09/07/2017   Procedure: CORONARY STENT INTERVENTION;  Surgeon: Leonie Man, MD;  Location: [place] [facility];  Service: Cardiovascular;  Laterality: N/A;   EYE SURGERY     GAS INSERTION Left 01/13/2018   Procedure: INSERTION OF GAS;  Surgeon: Bernarda Caffey, MD;  Location: [place];  Service: Ophthalmology;  Laterality: Left;   INSERTION OF DIALYSIS CATHETER Right 07/10/2019   Procedure: INSERTION OF TUNNELED DIALYSIS CATHETER;  Surgeon: Angelia Mould, MD;  Location: [facility] OR;  Service: Vascular;  Laterality: Right;   IR FLUORO GUIDE CV LINE RIGHT  07/05/2019   IR US GUIDE VASC ACCESS RIGHT  07/05/2019   LEFT HEART CATH AND CORONARY ANGIOGRAPHY N/A 09/07/2017   Procedure: LEFT HEART CATH AND CORONARY ANGIOGRAPHY;  Surgeon: Leonie Man, MD;  Location: [place] [facility];  Service: Cardiovascular;  Laterality: N/A;   MEMBRANE PEEL Left 01/13/2018   Procedure: MEMBRANE PEEL LEFT EYE ;  Surgeon: Bernarda Caffey, MD;  Location: [facility]  OR;  Service: Ophthalmology;  Laterality: Left;   MULTIPLE TOOTH EXTRACTIONS     PARS PLANA VITRECTOMY Left 01/13/2018   Procedure: PARS PLANA VITRECTOMY WITH 25 GAUGE LEFT EYE WITH ENDOLASER;  Surgeon: Bernarda Caffey, MD;  Location: [place];  Service: Ophthalmology;  Laterality: Left;   [place]   "partial; fibroids"    Family History  Problem Relation Age of Onset   Diabetes Mother    Hypertension Mother    COPD Mother    Heart  failure Mother    Hypertension Sister    Hypertension Brother     SOCIAL HISTORY: Social History   Socioeconomic History   Marital status: Married    Spouse name: Not on file   Number of children: Not on file   Years of education: 16   Highest education level: Not on file  Occupational History   Occupation: hairdresser  Tobacco Use   Smoking status: Never   Smokeless tobacco: Never  Vaping Use   Vaping Use: Never used  Substance and Sexual Activity   Alcohol use: No    Alcohol/week: 0.0 standard drinks   Drug use: No   Sexual activity: Yes  Other Topics Concern   Not on file  Social History Narrative   Married, 2 children, hairdresser   Caffeine use- occasional tea or coffee   Right handed   Social Determinants of Health   Financial Resource Strain: Not on file  Food Insecurity: Not on file  Transportation Needs: Not on file  Physical Activity: Not on file  Stress: Not on file  Social Connections: Not on file  Intimate Partner Violence: Not on file    Allergies  Allergen Reactions   Ace Inhibitors Cough    Current Outpatient Medications  Medication Sig Dispense Refill   acetaminophen (TYLENOL) 500 MG tablet Take 2 tablets (1,000 mg total) by mouth every 6 (six) hours as needed for mild pain or headache. 30 tablet 11   ALPRAZolam (XANAX) 0.25 MG tablet Take 1 tablet (0.25 mg total) by mouth 2 (two) times daily as needed for anxiety. 20 tablet 0   amLODipine (NORVASC) 10 MG tablet TAKE 1 TABLET (10 MG TOTAL) BY MOUTH DAILY. 90 tablet 2   aspirin EC 81 MG tablet Take 81 mg by mouth daily.     atorvastatin (LIPITOR) 40 MG tablet TAKE 1 TABLET BY MOUTH DAILY AT 6PM 90 tablet 1   brimonidine (ALPHAGAN) 0.2 % ophthalmic solution Place 1 drop into the left eye 2 (two) times daily.     carvedilol (COREG) 25 MG tablet TAKE 1 TABLET BY MOUTH TWO TIMES DAILY 60 tablet 3   dorzolamide-timolol (COSOPT) 22.3-6.8 MG/ML ophthalmic solution Place 1 drop into the left eye 2  (two) times daily.     feeding supplement, ENSURE ENLIVE, (ENSURE ENLIVE) LIQD Take 237 mLs by mouth 2 (two) times daily between meals. 237 mL 12   ferric citrate (AURYXIA) 1 GM 210 MG(Fe) tablet Take by mouth.     ferrous sulfate (FERROUSUL) 325 (65 FE) MG tablet Take 1 tablet (325 mg total) by mouth every other day. 90 tablet 1   glipiZIDE (GLUCOTROL) 10 MG tablet TAKE 1 TABLET BY MOUTH TWO TIMES DAILY BEFORE MEALS 60 tablet 3   hydrALAZINE (APRESOLINE) 50 MG tablet TAKE 1 TABLET (50 MG TOTAL) BY MOUTH 2 (TWO) TIMES DAILY. PATIENT NEEDS APPOINTMENT FOR ANY FUTURE REFILLS. 60 tablet 1   Insulin Glargine (LANTUS) 100 UNIT/ML  Solostar Pen Inject 4 Units into the skin daily. 9 mL 1   Insulin Pen Needle (TRUEPLUS PEN NEEDLES) 31G X 6 MM MISC USE AS DIRECTED 300 each 1   isosorbide mononitrate (IMDUR) 30 MG 24 hr tablet Take 1 tablet (30 mg total) by mouth daily. 30 tablet 0   ketorolac (ACULAR) 0.4 % SOLN Place 1 drop into the right eye 4 (four) times daily.     multivitamin (RENA-VIT) TABS tablet Take 1 tablet by mouth at bedtime. 30 tablet 0   nitroGLYCERIN (NITROSTAT) 0.4 MG SL tablet Place 1 tablet (0.4 mg total) under the tongue every 5 (five) minutes x 3 doses as needed for chest pain. 90 tablet 1   ofloxacin (OCUFLOX) 0.3 % ophthalmic solution Place 1 drop into the right eye 3 (three) times daily.     ondansetron (ZOFRAN ODT) 4 MG disintegrating tablet Take 1 tablet (4 mg total) by mouth every 8 (eight) hours as needed for nausea or vomiting. 10 tablet 0   ondansetron (ZOFRAN-ODT) 4 MG disintegrating tablet TAKE 1 TABLET (4 MG TOTAL) BY MOUTH EVERY 8 (EIGHT) HOURS AS NEEDED FOR NAUSEA OR VOMITING. 10 tablet 0   warfarin (COUMADIN) 5 MG tablet TAKE 1 TABLET BY MOUTH 4 TIMES A WEEK ON TUESDAY, THURSDAY, SATURDAY, AND SUNDAY. (TAKE 7.'5MG'$  ON MONDAY, WEDNESDAY, AND FRIDAY.) 78 tablet 1   No current facility-administered medications for this visit.    ROS:   General:  No weight loss, Fever,  chills  HEENT: No recent headaches, no nasal bleeding, no visual changes, no sore throat  Neurologic: No dizziness, blackouts, seizures. No recent symptoms of stroke or mini- stroke. No recent episodes of slurred speech, or temporary blindness.  Cardiac: No recent episodes of chest pain/pressure, no shortness of breath at rest.  No shortness of breath with exertion.  Denies history of atrial fibrillation or irregular heartbeat  Vascular: No history of rest pain in feet.  No history of claudication.  No history of non-healing ulcer, No history of DVT   Pulmonary: No home oxygen, no productive cough, no hemoptysis,  No asthma or wheezing  Musculoskeletal:  '[ ]'$  Arthritis, '[ ]'$  Low back pain,  '[ ]'$  Joint pain  Hematologic:No history of hypercoagulable state.  No history of easy bleeding.  No history of anemia  Gastrointestinal: No hematochezia or melena,  No gastroesophageal reflux, no trouble swallowing  Urinary: '[X]'$  chronic Kidney disease, '[X]'$  on HD - '[X]'$  MWF or '[ ]'$  TTHS, '[ ]'$  Burning with urination, '[ ]'$  Frequent urination, '[ ]'$  Difficulty urinating;   Skin: No rashes  Psychological: No history of anxiety,  No history of depression   Physical Examination  Vitals:   03/20/21 1442  BP: (!) 182/85  Pulse: 71  Resp: 20  Temp: 97.7 F (36.5 C)  SpO2: 97%  Weight: 183 lb (83 kg)  Height: '5\' 7"'$  (1.702 m)    Body mass index is 28.66 kg/m.  General:  Alert and oriented, no acute distress HEENT: Normal Neck: No JVD Pulmonary: Clear to auscultation bilaterally left side HD cath Cardiac: Regular Rate and Rhythm  Skin: No rash Extremity Pulses:  2+ radial, brachial  pulses bilaterally Musculoskeletal: No deformity or edema  Neurologic: Upper and lower extremity motor 5/5 and symmetric  DATA:  Patient had a vein mapping ultrasound today as well as upper extremity arterial duplex exam.  Vein mapping ultrasound showed less than 3 mm vein bilaterally.  She had a 3 mm left brachial  artery a  4 mm right brachial artery no obvious arterial obstruction  ASSESSMENT: Needs long-term hemodialysis access.  She would prefer to stay in the left arm.  We will get her scheduled in the near future on a Tuesday or Thursday for a left upper arm AV graft.  Risk benefits possible complications and procedure details include but not limited to bleeding infection graft thrombosis ischemic steal were all discussed with the patient today.  She understands agrees to proceed.  We will stop her warfarin 3 days prior to procedure.   PLAN: See above   Ruta Hinds, MD [facility] [place] Office: [phone]

## 2021-03-27 ENCOUNTER — Other Ambulatory Visit: Payer: Self-pay

## 2021-04-01 NOTE — Progress Notes (Signed)
I left a voice message on Amber Young's preferred number.  I told patient why I was calling and that a nurse,from [phone] number will be calling tomorrow.  I instructed patient that if someone had not reached her before evening meal -to not take Glipizide at evening meal.

## 2021-04-02 NOTE — Progress Notes (Signed)
Anesthesia Chart Review: SAME DAY WORK-UP  Case: [id number] Date/Time: 04/03/21 0715   Procedure: INSERTION OF LEFT UPPER ARM ARTERIOVENOUS (AV) GORE-TEX GRAFT (Left)   Anesthesia type: Choice   Pre-op diagnosis: ESRD   Location: [facility] OR ROOM 11 / [facility]   Surgeons: Serafina Mitchell, MD       DISCUSSION: Patient is a 65 year old female scheduled for the above procedure. She undergoes HD MWF. Her LUE AVF from 2020 is thrombosed, and she is currently using a left sided dialysis catheter.   History includes never smoker, HTN, HLD, CAD (s/p DES mLAD 05/18/16; overlapping DES x2 ostial & proximal RCA 09/07/17), murmur, atrial fibrillation (post DES 05/18/16), chronic combined systolic and diastolic CHF (diagnosed 10/863), DM2 (retinopathy, nephropathy; DKA 2020), CVA (09/2014), ESRD (left ulnar-cephalic AVF 78/46/96), GERD, anemia, glaucoma (left; legally blind left eye).  Last visit with cardiologist 10/05/19. See PROVIDERS below for summary. She in on amlodipine, ASA, Lipitor, Coreg, hydralazine, warfarin, Imdur for cardiac meds. She also has Nitro as needed prescribed.   Per [facility], Last warfarin dose for surgery 03/31/21.   Patient undergoes HD 3x/week. Currently, staff have not yet been able to reach patient to complete pre-operative phone call. Reviewed available information with anesthesiologist Oleta Mouse, MD. Clinical correlation on the day of surgery.    VS:  BP Readings from Last 3 Encounters:  03/20/21 (!) 182/85  09/08/20 (!) 179/87  06/28/20 (!) 168/88   Pulse Readings from Last 3 Encounters:  03/20/21 71  09/08/20 61  06/28/20 98     PROVIDERS: Kathe Becton, NP is PCP. Last visit 06/28/20.  Skeet Latch, MD is cardiologist. Last visit 10/05/19. Overall she was doing well, volume status improved since starting HD 06/2019.  Continue amlodipine, carvedilol, isosorbide for HTN. May add ARB later if needed. Continue warfarin for PAF, although notes INR frequently  subtherapeutic and she "seems to have very limited medical insight.  It is unclear what she is taking at home." She was going their CSW help with medical assistance and adherence in the home. Six month follow-up planned.   LABS: For ISTAT8 on arrival. A1c 8.5% 06/28/20. H/H 12/39.7 on 09/07/20.     IMAGES: 1V PCXR 09/07/20: FINDINGS: The heart size is enlarged. Aortic calcifications are noted. There is no pneumothorax or large pleural effusion. No focal infiltrate. IMPRESSION: Cardiomegaly without edema or pleural effusion.  CT Abd/pelvis 09/07/20: IMPRESSION: 1. No acute abnormality. 2. Sludge and multiple small dependent gallstones in the gallbladder without evidence of cholecystitis. 3. 1.0 cm low density mass in the liver at the junction of the caudate lobe with the remainder of the liver. This most likely represents a small cyst. A small hemangioma or other mass are less likely. This could be further evaluated with a limited right upper quadrant abdomen ultrasound. 4. Small hiatal hernia. 5. Cardiomegaly and small pericardial effusion. 6. Mild patchy interstitial opacity at both lung bases with a ground-glass pattern. This could be due to interstitial pulmonary edema, pneumonitis or chronic interstitial lung disease.  EKG: 09/07/20: Sinus rhythm Atrial premature complexes Probable left atrial enlargement LVH with secondary repolarization abnormality Anterior Q waves, possibly due to LVH Prolonged QT interval Since last tracing qt prolonged and ST segments slightly abnormal diffusely Confirmed by Noemi Chapel [phone]) on 09/07/2020 5:50:22 PM Also confirmed by Noemi Chapel [phone]), editor Holland, LaVerne [phone]) on 09/08/2020 8:45:21 AM   CV: Echo 07/01/19: IMPRESSIONS   1. Left ventricular ejection fraction, by visual estimation, is 35 to  40%. The  left ventricle has moderately decreased function. There is mildly  increased left ventricular hypertrophy.   2. Left  ventricular diastolic parameters are consistent with Grade II  diastolic dysfunction (pseudonormalization).   3. Mildly dilated left ventricular internal cavity size.   4. Diffuse hypokinesis worse in septum.   5. Global right ventricle has normal systolic function.The right  ventricular size is normal. No increase in right ventricular wall  thickness.   6. Left atrial size was moderately dilated.   7. Right atrial size was normal.   8. The mitral valve is normal in structure. Mild mitral valve  regurgitation.   9. The tricuspid valve is normal in structure. Tricuspid valve  regurgitation mild-moderate.  10. The aortic valve is tricuspid. Aortic valve regurgitation is not  visualized. Mild aortic valve sclerosis without stenosis.  11. The pulmonic valve was grossly normal. Pulmonic valve regurgitation is  mild.  12. Moderately elevated pulmonary artery systolic pressure.  (Comparison 01/24/19: LVEF 25-30%, diffuse LV hypokinesis with akinesis of hte mid septum, mild LVH, moderate diastolic dysfunction, biatrial enlargement, mild RVE with mildly reduced RVSF, RVSP moderately elevated with estimated pressure of 43.3 mmHg, mild MAC, mild MR, moderate TR; 07/21/18: LVEF 40-45%, severe anteroseptal apical and inferoapical akinesis, moderate MR, mild RV dysfunction)   Cardiac Cath 07/08/18: Prox RCA lesion is 75% stenosed. A drug-eluting stent was successfully placed using a STENT PROMUS PREM MR 2.75X16. Post intervention, there is a 5% residual stenosis. Ost RCA lesion is 85% stenosed. A drug-eluting stent was successfully placed using a STENT PROMUS PREM MR 3.0X12. Overlaps Stent 1 proximally. Post intervention, there is a 0% residual stenosis. Mid RCA-1 lesion is 30% stenosed. Mid RCA-2 lesion is 25% stenosed. Ost 1st Diag lesion is 30% stenosed. Ost 2nd Diag to 2nd Diag lesion is 80% stenosed. Stable. Prox LAD lesion is 40% stenosed. Previously placed Mid LAD Promus drug eluting stent is  widely patent. There is mild left ventricular systolic dysfunction. The left ventricular ejection fraction is 45-50% by visual estimate. LV end diastolic pressure is mildly elevated.   Severe Ostial & proximal RCA stenosis (progression of disease from Oct 2017).  Successful PCI using 2 overlapping DES.  Difficult, ostial lesion PCI requiring multiple balloons, buddy wire & extra support wire.   Plan:  Overnight monitoring in the posterior unit (6C) TR band removal per protocol Continue DAPT -at least 1 more year\ Continue aggressive risk factor modification.  Past Medical History:  Diagnosis Date   Anemia    Atrial fibrillation ([place]) 06/17/2019   Cataract, bilateral    Chronic combined systolic and diastolic CHF (congestive heart failure) ([place])    a. EF 40-45% in 2019 b. EF 25-30% by repeat echo in 01/2019   CKD (chronic kidney disease), stage IV (HCC)    Coronary artery disease 08/2017   DES x 2 RCA   Dialysis patient ([place]) 07/2019   DKA (diabetic ketoacidoses) 06/16/2019   GERD (gastroesophageal reflux disease)    Glaucoma, left eye    Heart murmur    Hyperlipidemia    Hypertension    Proliferative diabetic retinopathy ([place])    left eye with vitreous hemorrhage and tractional retinal detachment   Stroke ([place]) 09/2014   numbness left upper lip, finger tips on left hand; "resolved" (05/18/2016)   Type II diabetes mellitus ([place])    Wears glasses     Past Surgical History:  Procedure Laterality Date   AV FISTULA PLACEMENT Left 07/10/2019   Procedure: ARTERIOVENOUS (AV) FISTULA CREATION;  Surgeon: Angelia Mould, MD;  Location: [facility];  Service: Vascular;  Laterality: Left;   CARDIAC CATHETERIZATION N/A 05/18/2016   Procedure: Left Heart Cath and Coronary Angiography;  Surgeon: Jettie Booze, MD;  Location: [facility];  Service: Cardiovascular;  Laterality: N/A;   CARDIAC CATHETERIZATION N/A 05/18/2016   Procedure: Coronary Stent Intervention;  Surgeon:  Jettie Booze, MD;  Location: [facility];  Service: Cardiovascular;  Laterality: N/A;   CATARACT EXTRACTION Right 2020   COLONOSCOPY W/ BIOPSIES AND POLYPECTOMY     CORONARY ANGIOPLASTY     CORONARY ANGIOPLASTY WITH STENT PLACEMENT     CORONARY STENT INTERVENTION N/A 09/07/2017   Procedure: CORONARY STENT INTERVENTION;  Surgeon: Leonie Man, MD;  Location: [facility];  Service: Cardiovascular;  Laterality: N/A;   EYE SURGERY     GAS INSERTION Left 01/13/2018   Procedure: INSERTION OF GAS;  Surgeon: Bernarda Caffey, MD;  Location: [facility];  Service: Ophthalmology;  Laterality: Left;   INSERTION OF DIALYSIS CATHETER Right 07/10/2019   Procedure: INSERTION OF TUNNELED DIALYSIS CATHETER;  Surgeon: Angelia Mould, MD;  Location: [facility];  Service: Vascular;  Laterality: Right;   IR FLUORO GUIDE CV LINE RIGHT  07/05/2019   IR US GUIDE VASC ACCESS RIGHT  07/05/2019   LEFT HEART CATH AND CORONARY ANGIOGRAPHY N/A 09/07/2017   Procedure: LEFT HEART CATH AND CORONARY ANGIOGRAPHY;  Surgeon: Leonie Man, MD;  Location: [facility];  Service: Cardiovascular;  Laterality: N/A;   MEMBRANE PEEL Left 01/13/2018   Procedure: MEMBRANE PEEL LEFT EYE ;  Surgeon: Bernarda Caffey, MD;  Location: [facility];  Service: Ophthalmology;  Laterality: Left;   MULTIPLE TOOTH EXTRACTIONS     PARS PLANA VITRECTOMY Left 01/13/2018   Procedure: PARS PLANA VITRECTOMY WITH 25 GAUGE LEFT EYE WITH ENDOLASER;  Surgeon: Bernarda Caffey, MD;  Location: [facility];  Service: Ophthalmology;  Laterality: Left;   [place]   "partial; fibroids"    MEDICATIONS: No current facility-administered medications for this encounter.    acetaminophen (TYLENOL) 500 MG tablet   amLODipine (NORVASC) 10 MG tablet   aspirin EC 81 MG tablet   atorvastatin (LIPITOR) 40 MG tablet   carvedilol (COREG) 25 MG tablet   ferric citrate (AURYXIA) 1 GM 210 MG(Fe) tablet   ferrous sulfate  (FERROUSUL) 325 (65 FE) MG tablet   glipiZIDE (GLUCOTROL) 10 MG tablet   hydrALAZINE (APRESOLINE) 50 MG tablet   Insulin Glargine (LANTUS) 100 UNIT/ML Solostar Pen   isosorbide mononitrate (IMDUR) 30 MG 24 hr tablet   multivitamin (RENA-VIT) TABS tablet   nitroGLYCERIN (NITROSTAT) 0.4 MG SL tablet   warfarin (COUMADIN) 5 MG tablet   ALPRAZolam (XANAX) 0.25 MG tablet   feeding supplement, ENSURE ENLIVE, (ENSURE ENLIVE) LIQD   Insulin Pen Needle (TRUEPLUS PEN NEEDLES) 31G X 6 MM MISC   ondansetron (ZOFRAN ODT) 4 MG disintegrating tablet   ondansetron (ZOFRAN-ODT) 4 MG disintegrating tablet    Myra Gianotti, PA-C Surgical Short Stay/Anesthesiology [facility] Phone [phone] [facility] Phone [phone] 04/02/2021 4:35 PM

## 2021-04-02 NOTE — Progress Notes (Signed)
DUE TO COVID-19 ONLY ONE VISITOR IS ALLOWED TO COME WITH YOU AND STAY IN THE WAITING ROOM ONLY DURING PRE OP AND PROCEDURE DAY OF SURGERY.   Cardiologist - Dr Skeet Latch Internal Med - Kathe Becton, FNP  Chest x-ray - 09/07/20 EKG - 09/07/20 Stress Test - 05/20/16 ECHO - 07/01/19 Cardiac Cath - 07/08/18  Sleep Study -  n/a CPAP - none  Fasting Blood Sugar - unknown Checks Blood Sugar "only when needed"  Do not take glipizide tonight and on the morning of surgery (Thursday)            THE MORNING OF SURGERY, take 2 units of lantus insulin (if needed).  If your CBG is greater than 220 mg/dL, you may take  of your sliding scale (correction) dose of insulin.  If your blood sugar is less than 70 mg/dL, you will need to treat for low blood sugar: Treat a low blood sugar (less than 70 mg/dL) with  cup of clear juice (cranberry or apple), 4 glucose tablets, OR glucose gel. Recheck blood sugar in 15 minutes after treatment (to make sure it is greater than 70 mg/dL). If your blood sugar is not greater than 70 mg/dL on recheck, call [phone] for further instructions.  Blood Thinner Instructions:  Last dose of warfarin was on 03/31/21  Aspirin Instructions: Follow your surgeon's instructions on when to stop aspirin prior to surgery,  If no instructions were given by your surgeon then you will need to call the office for those instructions.  Anesthesia review: Yes  STOP now taking any Aspirin (unless otherwise instructed by your surgeon), Aleve, Naproxen, Ibuprofen, Motrin, Advil, Goody's, BC's, all herbal medications, fish oil, and all vitamins.   Coronavirus Screening Covid test n/a Ambulatory Surgery  Do you have any of the following symptoms:  Cough yes/no: No Fever (>100.48F)  yes/no: No Runny nose yes/no: No Sore throat yes/no: No Difficulty breathing/shortness of breath  yes/no: No  Have you traveled in the last 14 days and where? yes/no: No  Husband verbalized  understanding of instructions that were given via phone.

## 2021-04-02 NOTE — Anesthesia Preprocedure Evaluation (Addendum)
Anesthesia Evaluation  Patient identified by MRN, date of birth, ID band Patient awake    Reviewed: Allergy & Precautions, NPO status , Patient's Chart, lab work & pertinent test results, reviewed documented beta blocker date and time   History of Anesthesia Complications Negative for: history of anesthetic complications  Airway Mallampati: III  TM Distance: >3 FB Neck ROM: Full    Dental  (+) Teeth Intact, Dental Advisory Given,    Pulmonary neg shortness of breath, neg sleep apnea, neg COPD, neg recent URI, neg PE   breath sounds clear to auscultation       Cardiovascular hypertension, Pt. on medications and Pt. on home beta blockers + CAD, + Cardiac Stents, + Peripheral Vascular Disease and +CHF  + dysrhythmias Atrial Fibrillation  Rhythm:Irregular  1. Left ventricular ejection fraction, by visual estimation, is 35 to  40%. The left ventricle has moderately decreased function. There is mildly  increased left ventricular hypertrophy.  2. Left ventricular diastolic parameters are consistent with Grade II  diastolic dysfunction (pseudonormalization).  3. Mildly dilated left ventricular internal cavity size.  4. Diffuse hypokinesis worse in septum.  5. Global right ventricle has normal systolic function.The right  ventricular size is normal. No increase in right ventricular wall  thickness.  6. Left atrial size was moderately dilated.  7. Right atrial size was normal.  8. The mitral valve is normal in structure. Mild mitral valve  regurgitation.  9. The tricuspid valve is normal in structure. Tricuspid valve  regurgitation mild-moderate.  10. The aortic valve is tricuspid. Aortic valve regurgitation is not  visualized. Mild aortic valve sclerosis without stenosis.  11. The pulmonic valve was grossly normal. Pulmonic valve regurgitation is  mild.  12. Moderately elevated pulmonary artery systolic pressure.   ? Prox RCA  lesion is 75% stenosed. ? A drug-eluting stent was successfully placed using a STENT PROMUS PREM MR 2.75X16. ? Post intervention, there is a 5% residual stenosis. ? Ost RCA lesion is 85% stenosed. ? A drug-eluting stent was successfully placed using a STENT PROMUS PREM MR 3.0X12. Overlaps Stent 1 proximally. ? Post intervention, there is a 0% residual stenosis. ? Mid RCA-1 lesion is 30% stenosed. Mid RCA-2 lesion is 25% stenosed. ? Ost 1st Diag lesion is 30% stenosed. Ost 2nd Diag to 2nd Diag lesion is 80% stenosed. Stable. ? Prox LAD lesion is 40% stenosed. Previously placed Mid LAD Promus drug eluting stent is widely patent. ? There is mild left ventricular systolic dysfunction. The left ventricular ejection fraction is 45-50% by visual estimate. ? LV end diastolic pressure is mildly elevated.   Severe Ostial & proximal RCA stenosis (progression of disease from Oct 2017).  Successful PCI using 2 overlapping DES.  Difficult, ostial lesion PCI requiring multiple balloons, buddy wire & extra support wire.  Plan:  ? Overnight monitoring in the posterior unit (6C) ? TR band removal per protocol ? Continue DAPT -at least 1 more year\ ? Continue aggressive risk factor modification.    Neuro/Psych  Headaches, CVA negative psych ROS   GI/Hepatic GERD  ,  Endo/Other  diabetesLab Results      Component                Value               Date                      HGBA1C  8.5 (A)             06/28/2020                HGBA1C                   8.5                 06/28/2020                HGBA1C                   8.5 (A)             06/28/2020                HGBA1C                   8.5 (A)             06/28/2020             Renal/GU ESRF and DialysisRenal diseaseLab Results      Component                Value               Date                      CREATININE               4.50 (H)            04/03/2021           Lab Results      Component                Value                Date                      K                        3.6                 04/03/2021            Last HD 8/24     Musculoskeletal   Abdominal   Peds  Hematology negative hematology ROS (+) Lab Results      Component                Value               Date                      WBC                      4.0                 09/07/2020                HGB                      14.3                04/03/2021                HCT  42.0                04/03/2021                MCV                      81.0                09/07/2020                PLT                      263                 09/07/2020              Anesthesia Other Findings   Reproductive/Obstetrics                            Anesthesia Physical Anesthesia Plan  ASA: 3  Anesthesia Plan: General   Post-op Pain Management:    Induction: Intravenous  PONV Risk Score and Plan: 3 and Ondansetron and Dexamethasone  Airway Management Planned: LMA  Additional Equipment: None  Intra-op Plan:   Post-operative Plan: Extubation in OR  Informed Consent: I have reviewed the patients History and Physical, chart, labs and discussed the procedure including the risks, benefits and alternatives for the proposed anesthesia with the patient or authorized representative who has indicated his/her understanding and acceptance.     Dental advisory given  Plan Discussed with: CRNA and Anesthesiologist  Anesthesia Plan Comments: (PAT note written 04/02/2021 by Myra Gianotti, PA-C. )       Anesthesia Quick Evaluation

## 2021-04-03 ENCOUNTER — Other Ambulatory Visit: Payer: Self-pay

## 2021-04-03 ENCOUNTER — Other Ambulatory Visit: Payer: Self-pay | Admitting: Physician Assistant

## 2021-04-03 ENCOUNTER — Encounter (HOSPITAL_COMMUNITY)
Admission: RE | Disposition: A | Discharge status: Home / Self Care | Payer: Self-pay | Source: Home / Self Care | Attending: Surgery

## 2021-04-03 ENCOUNTER — Ambulatory Visit (HOSPITAL_COMMUNITY): Payer: Medicare Other | Admitting: Vascular Surgery

## 2021-04-03 ENCOUNTER — Encounter (HOSPITAL_COMMUNITY): Payer: Self-pay | Admitting: Surgery

## 2021-04-03 ENCOUNTER — Ambulatory Visit (HOSPITAL_COMMUNITY)
Admission: RE | Admit: 2021-04-03 | Discharge: 2021-04-03 | Disposition: A | Discharge status: Home / Self Care | Payer: Medicare Other | Attending: Surgery | Admitting: Surgery

## 2021-04-03 ENCOUNTER — Other Ambulatory Visit: Payer: Self-pay | Admitting: Vascular Surgery

## 2021-04-03 DIAGNOSIS — Z794 Long term (current) use of insulin: Secondary | ICD-10-CM | POA: Diagnosis not present

## 2021-04-03 DIAGNOSIS — Z7982 Long term (current) use of aspirin: Secondary | ICD-10-CM | POA: Insufficient documentation

## 2021-04-03 DIAGNOSIS — Z992 Dependence on renal dialysis: Secondary | ICD-10-CM | POA: Diagnosis not present

## 2021-04-03 DIAGNOSIS — T82868A Thrombosis of vascular prosthetic devices, implants and grafts, initial encounter: Secondary | ICD-10-CM | POA: Insufficient documentation

## 2021-04-03 DIAGNOSIS — Z955 Presence of coronary angioplasty implant and graft: Secondary | ICD-10-CM | POA: Insufficient documentation

## 2021-04-03 DIAGNOSIS — Z7984 Long term (current) use of oral hypoglycemic drugs: Secondary | ICD-10-CM | POA: Diagnosis not present

## 2021-04-03 DIAGNOSIS — Z825 Family history of asthma and other chronic lower respiratory diseases: Secondary | ICD-10-CM | POA: Diagnosis not present

## 2021-04-03 DIAGNOSIS — Z8249 Family history of ischemic heart disease and other diseases of the circulatory system: Secondary | ICD-10-CM | POA: Insufficient documentation

## 2021-04-03 DIAGNOSIS — I5042 Chronic combined systolic (congestive) and diastolic (congestive) heart failure: Secondary | ICD-10-CM | POA: Diagnosis not present

## 2021-04-03 DIAGNOSIS — I132 Hypertensive heart and chronic kidney disease with heart failure and with stage 5 chronic kidney disease, or end stage renal disease: Secondary | ICD-10-CM | POA: Diagnosis not present

## 2021-04-03 DIAGNOSIS — N186 End stage renal disease: Secondary | ICD-10-CM

## 2021-04-03 DIAGNOSIS — Z7901 Long term (current) use of anticoagulants: Secondary | ICD-10-CM | POA: Diagnosis not present

## 2021-04-03 DIAGNOSIS — Z833 Family history of diabetes mellitus: Secondary | ICD-10-CM | POA: Insufficient documentation

## 2021-04-03 DIAGNOSIS — Z79899 Other long term (current) drug therapy: Secondary | ICD-10-CM | POA: Diagnosis not present

## 2021-04-03 DIAGNOSIS — E1122 Type 2 diabetes mellitus with diabetic chronic kidney disease: Secondary | ICD-10-CM | POA: Insufficient documentation

## 2021-04-03 DIAGNOSIS — X58XXXA Exposure to other specified factors, initial encounter: Secondary | ICD-10-CM | POA: Diagnosis not present

## 2021-04-03 HISTORY — PX: AV FISTULA PLACEMENT: SHX1204

## 2021-04-03 LAB — GLUCOSE, CAPILLARY
Glucose-Capillary: 159 mg/dL — ABNORMAL HIGH (ref 70–99)
Glucose-Capillary: 158 mg/dL — ABNORMAL HIGH (ref 70–99)

## 2021-04-03 LAB — POCT I-STAT, CHEM 8
BUN: 28 mg/dL — ABNORMAL HIGH (ref 8–23)
Calcium, Ion: 1.04 mmol/L — ABNORMAL LOW (ref 1.15–1.40)
Chloride: 98 mmol/L (ref 98–111)
Creatinine, Ser: 4.5 mg/dL — ABNORMAL HIGH (ref 0.44–1.00)
Glucose, Bld: 155 mg/dL — ABNORMAL HIGH (ref 70–99)
HCT: 42 % (ref 36.0–46.0)
Hemoglobin: 14.3 g/dL (ref 12.0–15.0)
Potassium: 3.6 mmol/L (ref 3.5–5.1)
Sodium: 138 mmol/L (ref 135–145)
TCO2: 31 mmol/L (ref 22–32)

## 2021-04-03 SURGERY — INSERTION OF ARTERIOVENOUS (AV) GORE-TEX GRAFT ARM
Anesthesia: General | Site: Arm Upper | Laterality: Left

## 2021-04-03 MED ORDER — CHLORHEXIDINE GLUCONATE 4 % EX LIQD: 60.0000 mL | Freq: Once | CUTANEOUS | Status: DC

## 2021-04-03 MED ORDER — FENTANYL CITRATE (PF) 250 MCG/5ML IJ SOLN
INTRAMUSCULAR | Status: AC
  Filled 2021-04-03: qty 5

## 2021-04-03 MED ORDER — ESMOLOL HCL 100 MG/10ML IV SOLN
INTRAVENOUS | Status: DC | PRN
  Administered 2021-04-03 (×2): 20 mg via INTRAVENOUS

## 2021-04-03 MED ORDER — CEFAZOLIN SODIUM-DEXTROSE 2-4 GM/100ML-% IV SOLN
2.0000 g | INTRAVENOUS | Status: AC
  Administered 2021-04-03: 2 g via INTRAVENOUS
  Filled 2021-04-03: qty 100

## 2021-04-03 MED ORDER — BUPIVACAINE HCL (PF) 0.5 % IJ SOLN
INTRAMUSCULAR | Status: DC | PRN
  Administered 2021-04-03: 30 mL

## 2021-04-03 MED ORDER — FENTANYL CITRATE (PF) 100 MCG/2ML IJ SOLN: 25.0000 ug | INTRAMUSCULAR | Status: DC | PRN

## 2021-04-03 MED ORDER — PROPOFOL 10 MG/ML IV BOLUS
INTRAVENOUS | Status: AC
  Filled 2021-04-03: qty 20

## 2021-04-03 MED ORDER — BUPIVACAINE LIPOSOME 1.3 % IJ SUSP
INTRAMUSCULAR | Status: AC
  Filled 2021-04-03: qty 20

## 2021-04-03 MED ORDER — OXYCODONE HCL 5 MG PO TABS
5.0000 mg | ORAL_TABLET | Freq: Three times a day (TID) | ORAL | 0 refills | Status: DC | PRN
  Filled 2021-04-03: qty 6, 2d supply, fill #0

## 2021-04-03 MED ORDER — LIDOCAINE-EPINEPHRINE 1 %-1:100000 IJ SOLN
INTRAMUSCULAR | Status: AC
  Filled 2021-04-03: qty 1

## 2021-04-03 MED ORDER — OXYCODONE HCL 5 MG/5ML PO SOLN: 5.0000 mg | Freq: Once | ORAL | Status: DC | PRN

## 2021-04-03 MED ORDER — PROTAMINE SULFATE 10 MG/ML IV SOLN
INTRAVENOUS | Status: AC
  Filled 2021-04-03: qty 25

## 2021-04-03 MED ORDER — ACETAMINOPHEN 10 MG/ML IV SOLN
1000.0000 mg | Freq: Once | INTRAVENOUS | Status: DC | PRN
  Administered 2021-04-03: 1000 mg via INTRAVENOUS

## 2021-04-03 MED ORDER — ACETAMINOPHEN 10 MG/ML IV SOLN
INTRAVENOUS | Status: AC
  Filled 2021-04-03: qty 100

## 2021-04-03 MED ORDER — LIDOCAINE-EPINEPHRINE (PF) 1 %-1:200000 IJ SOLN
INTRAMUSCULAR | Status: AC
  Filled 2021-04-03: qty 30

## 2021-04-03 MED ORDER — ACETAMINOPHEN 160 MG/5ML PO SOLN: 1000.0000 mg | Freq: Once | ORAL | Status: DC | PRN

## 2021-04-03 MED ORDER — CARVEDILOL 12.5 MG PO TABS
ORAL_TABLET | ORAL | Status: AC
  Filled 2021-04-03: qty 2

## 2021-04-03 MED ORDER — ONDANSETRON HCL 4 MG/2ML IJ SOLN
INTRAMUSCULAR | Status: DC | PRN
  Administered 2021-04-03: 4 mg via INTRAVENOUS

## 2021-04-03 MED ORDER — PROTAMINE SULFATE 10 MG/ML IV SOLN
INTRAVENOUS | Status: DC | PRN
  Administered 2021-04-03: 25 mg via INTRAVENOUS

## 2021-04-03 MED ORDER — HEPARIN SODIUM (PORCINE) 1000 UNIT/ML IJ SOLN
INTRAMUSCULAR | Status: AC
  Filled 2021-04-03: qty 1

## 2021-04-03 MED ORDER — HEPARIN 6000 UNIT IRRIGATION SOLUTION
Status: AC
  Filled 2021-04-03: qty 500

## 2021-04-03 MED ORDER — ONDANSETRON HCL 4 MG/2ML IJ SOLN
INTRAMUSCULAR | Status: AC
  Filled 2021-04-03: qty 2

## 2021-04-03 MED ORDER — SODIUM CHLORIDE 0.9 % IV SOLN: INTRAVENOUS | Status: DC

## 2021-04-03 MED ORDER — BUPIVACAINE HCL (PF) 0.5 % IJ SOLN
INTRAMUSCULAR | Status: AC
  Filled 2021-04-03: qty 30

## 2021-04-03 MED ORDER — LIDOCAINE HCL (CARDIAC) PF 100 MG/5ML IV SOSY
PREFILLED_SYRINGE | INTRAVENOUS | Status: DC | PRN
  Administered 2021-04-03: 60 mg via INTRAVENOUS

## 2021-04-03 MED ORDER — CHLORHEXIDINE GLUCONATE 0.12 % MT SOLN
15.0000 mL | Freq: Once | OROMUCOSAL | Status: AC
  Administered 2021-04-03: 15 mL via OROMUCOSAL
  Filled 2021-04-03: qty 15

## 2021-04-03 MED ORDER — ACETAMINOPHEN 500 MG PO TABS: 1000.0000 mg | ORAL_TABLET | Freq: Once | ORAL | Status: DC | PRN

## 2021-04-03 MED ORDER — HEMOSTATIC AGENTS (NO CHARGE) OPTIME
TOPICAL | Status: DC | PRN
  Administered 2021-04-03: 1 via TOPICAL

## 2021-04-03 MED ORDER — HEPARIN 6000 UNIT IRRIGATION SOLUTION
Status: DC | PRN
  Administered 2021-04-03: 1

## 2021-04-03 MED ORDER — HEPARIN SODIUM (PORCINE) 1000 UNIT/ML IJ SOLN
INTRAMUSCULAR | Status: DC | PRN
  Administered 2021-04-03: 3000 [IU] via INTRAVENOUS

## 2021-04-03 MED ORDER — FENTANYL CITRATE (PF) 100 MCG/2ML IJ SOLN
INTRAMUSCULAR | Status: DC | PRN
  Administered 2021-04-03 (×2): 100 ug via INTRAVENOUS

## 2021-04-03 MED ORDER — ORAL CARE MOUTH RINSE: 15.0000 mL | Freq: Once | OROMUCOSAL | Status: AC

## 2021-04-03 MED ORDER — PROPOFOL 10 MG/ML IV BOLUS
INTRAVENOUS | Status: DC | PRN
  Administered 2021-04-03: 110 mg via INTRAVENOUS

## 2021-04-03 MED ORDER — MIDAZOLAM HCL 2 MG/2ML IJ SOLN
INTRAMUSCULAR | Status: AC
  Filled 2021-04-03: qty 2

## 2021-04-03 MED ORDER — BUPIVACAINE LIPOSOME 1.3 % IJ SUSP
INTRAMUSCULAR | Status: DC | PRN
  Administered 2021-04-03: 20 mL

## 2021-04-03 MED ORDER — CARVEDILOL 25 MG PO TABS
25.0000 mg | ORAL_TABLET | Freq: Once | ORAL | Status: AC
  Administered 2021-04-03: 25 mg via ORAL
  Filled 2021-04-03: qty 1

## 2021-04-03 MED ORDER — OXYCODONE HCL 5 MG PO TABS
5.0000 mg | ORAL_TABLET | Freq: Once | ORAL | Status: DC | PRN
Start: 2021-04-03 — End: 2021-04-03

## 2021-04-03 MED ORDER — ESMOLOL HCL 100 MG/10ML IV SOLN
INTRAVENOUS | Status: AC
  Filled 2021-04-03: qty 10

## 2021-04-03 MED ORDER — OXYCODONE-ACETAMINOPHEN 5-325 MG PO TABS: 1.0000 | ORAL_TABLET | Freq: Four times a day (QID) | ORAL | 0 refills | Status: AC | PRN

## 2021-04-03 MED ORDER — 0.9 % SODIUM CHLORIDE (POUR BTL) OPTIME
TOPICAL | Status: DC | PRN
  Administered 2021-04-03: 1000 mL

## 2021-04-03 SURGICAL SUPPLY — 40 items
ARMBAND PINK RESTRICT EXTREMIT (MISCELLANEOUS) ×4 IMPLANT
BAG COUNTER SPONGE SURGICOUNT (BAG) ×2 IMPLANT
BNDG ELASTIC 3X5.8 VLCR STR LF (GAUZE/BANDAGES/DRESSINGS) ×2 IMPLANT
BNDG ELASTIC 4X5.8 VLCR STR LF (GAUZE/BANDAGES/DRESSINGS) ×2 IMPLANT
CANISTER SUCT 3000ML PPV (MISCELLANEOUS) ×2 IMPLANT
CLIP VESOCCLUDE MED 6/CT (CLIP) ×2 IMPLANT
CLIP VESOCCLUDE SM WIDE 6/CT (CLIP) ×2 IMPLANT
COVER PROBE W GEL 5X96 (DRAPES) ×2 IMPLANT
DERMABOND ADVANCED (GAUZE/BANDAGES/DRESSINGS) ×1
DERMABOND ADVANCED .7 DNX12 (GAUZE/BANDAGES/DRESSINGS) ×1 IMPLANT
ELECT REM PT RETURN 9FT ADLT (ELECTROSURGICAL) ×2
ELECTRODE REM PT RTRN 9FT ADLT (ELECTROSURGICAL) ×1 IMPLANT
GAUZE SPONGE 4X4 12PLY STRL LF (GAUZE/BANDAGES/DRESSINGS) ×2 IMPLANT
GLOVE SRG 8 PF TXTR STRL LF DI (GLOVE) ×2 IMPLANT
GLOVE SURG NEOP MICRO LF SZ6.5 (GLOVE) ×2 IMPLANT
GLOVE SURG POLYISO LF SZ7.5 (GLOVE) ×2 IMPLANT
GLOVE SURG UNDER POLY LF SZ8 (GLOVE) ×2
GOWN STRL REUS W/ TWL LRG LVL3 (GOWN DISPOSABLE) ×2 IMPLANT
GOWN STRL REUS W/ TWL XL LVL3 (GOWN DISPOSABLE) ×2 IMPLANT
GOWN STRL REUS W/TWL LRG LVL3 (GOWN DISPOSABLE) ×2
GOWN STRL REUS W/TWL XL LVL3 (GOWN DISPOSABLE) ×2
GRAFT GORETEX STRT 4-7X45 (Vascular Products) ×2 IMPLANT
HEMOSTAT SNOW SURGICEL 2X4 (HEMOSTASIS) IMPLANT
KIT BASIN OR (CUSTOM PROCEDURE TRAY) ×2 IMPLANT
KIT TURNOVER KIT B (KITS) ×2 IMPLANT
NEEDLE 18GX1X1/2 (RX/OR ONLY) (NEEDLE) ×2 IMPLANT
NS IRRIG 1000ML POUR BTL (IV SOLUTION) ×2 IMPLANT
PACK CV ACCESS (CUSTOM PROCEDURE TRAY) ×2 IMPLANT
PAD ARMBOARD 7.5X6 YLW CONV (MISCELLANEOUS) ×4 IMPLANT
SPONGE T-LAP 18X18 ~~LOC~~+RFID (SPONGE) ×2 IMPLANT
SURGIFLO W/THROMBIN 8M KIT (HEMOSTASIS) ×2 IMPLANT
SUT PROLENE 6 0 BV (SUTURE) ×6 IMPLANT
SUT VIC AB 3-0 SH 27 (SUTURE) ×2
SUT VIC AB 3-0 SH 27X BRD (SUTURE) ×2 IMPLANT
SUT VICRYL 4-0 PS2 18IN ABS (SUTURE) IMPLANT
SYR 30ML LL (SYRINGE) ×2 IMPLANT
SYR TOOMEY 50ML (SYRINGE) IMPLANT
TOWEL GREEN STERILE (TOWEL DISPOSABLE) ×2 IMPLANT
UNDERPAD 30X36 HEAVY ABSORB (UNDERPADS AND DIAPERS) ×2 IMPLANT
WATER STERILE IRR 1000ML POUR (IV SOLUTION) ×2 IMPLANT

## 2021-04-03 NOTE — Op Note (Signed)
    Patient name: Amber Young MRN: [id number] DOB: [birth year] Sex: female  04/03/2021 Pre-operative Diagnosis: ESRD Post-operative diagnosis:  Same Surgeon:  Annamarie Major Procedure:   Left upper arm AVGG (4x7) Anesthesia:  General Blood Loss:   Minimal Specimens:  none  Findings:  end to end venous anastamosis  Indications: This is a 65 year old female who comes in today for new dialysis access.  Procedure:  The patient was identified in the holding area and taken to [facility]  The patient was then placed supine on the table. general anesthesia was administered.  The patient was prepped and draped in the usual sterile fashion.  A time out was called and antibiotics were administered.  Ultrasound was used to evaluate the basilic vein which was too small to use for a fistula.  I therefore plan for a left upper arm graft.  A longitudinal incision was made just above the antecubital crease.  Through this incision the brachial artery was dissected free.  This was a 3 mm disease-free artery.  It was encircled proximally and distally with Vesseloops.  Next a longitudinal incision was made up near the axilla.  Through this incision the brachial vein was dissected out.  This is about a 6 mm vein.  I then created a tunnel between the 2 incisions using a horseshoe tunneler.  Exparel was used for local anesthesia within the tunnel and in the incisions.  A 4 x 7 stretch Gore-Tex graft was brought through the tunnel.  Next, 25 mg of heparin was administered.  After the heparin circulated, the brachial artery was occluded with vascular clamps.  A #11 blade was used to make an arteriotomy which was extended longitudinally with Potts scissors.  The 4 mm and of the graft was beveled to fit the size the arteriotomy, and a running anastomosis was created with 6-0 Prolene.  Prior to completion the appropriate flushing maneuvers were performed and the anastomosis was completed.  There was excellent pulsatile flow  through the graft.  Next, the brachial vein was occluded proximally with the baby Gregory clamps and ligated distally with a 2-0 silk tie.  A end to end anastomosis was then performed with running 6-0 Prolene.  Prior to completion the appropriate flushing maneuvers were performed the anastomosis was completed.  Hand-held Doppler was used to evaluate the signal in the basilic vein which was brisk as well as the radial and ulnar Doppler signals which were biphasic.  25 mg of protamine was used to reverse the heparin.  The wound was irrigated.  The incisions were closed with 2 layers of 3-0 Vicryl followed by Dermabond.  There were no immediate complications.   Disposition: To PACU stable.   Theotis Burrow, M.D., [facility] [place] Office: [phone] Pager:  [phone]

## 2021-04-03 NOTE — Transfer of Care (Signed)
Immediate Anesthesia Transfer of Care Note  Patient: Amber Young  Procedure(s) Performed: INSERTION OF LEFT UPPER ARM ARTERIOVENOUS (AV) GORE-TEX GRAFT (Left: Arm Upper)  Patient Location: PACU  Anesthesia Type:General  Level of Consciousness: awake, alert , oriented and patient cooperative  Airway & Oxygen Therapy: Patient Spontanous Breathing  Post-op Assessment: Report given to RN, Post -op Vital signs reviewed and stable and Patient moving all extremities X 4  Post vital signs: Reviewed and stable  Last Vitals:  Vitals Value Taken Time  BP 157/85 04/03/21 0951  Temp 36.2 C 04/03/21 0951  Pulse 72 04/03/21 0956  Resp 17 04/03/21 0956  SpO2 96 % 04/03/21 0956  Vitals shown include unvalidated device data.  Last Pain:  Vitals:   04/03/21 0951  TempSrc:   PainSc: Asleep         Complications: No notable events documented.

## 2021-04-03 NOTE — Anesthesia Procedure Notes (Signed)
Procedure Name: LMA Insertion Date/Time: 04/03/2021 7:50 AM Performed by: Jonna Munro, CRNA Pre-anesthesia Checklist: Patient identified, Emergency Drugs available, Suction available, Patient being monitored and Timeout performed Patient Re-evaluated:Patient Re-evaluated prior to induction Oxygen Delivery Method: Circle system utilized Preoxygenation: Pre-oxygenation with 100% oxygen Induction Type: IV induction LMA: LMA inserted LMA Size: 4.0 Number of attempts: 1 Placement Confirmation: positive ETCO2 and breath sounds checked- equal and bilateral Tube secured with: Tape Dental Injury: Teeth and Oropharynx as per pre-operative assessment

## 2021-04-03 NOTE — Interval H&P Note (Signed)
History and Physical Interval Note:  04/03/2021 7:31 AM  [name]  has presented today for surgery, with the diagnosis of End Stage Renal Disease.  The various methods of treatment have been discussed with the patient and family. After consideration of risks, benefits and other options for treatment, the patient has consented to  Procedure(s): INSERTION OF LEFT UPPER ARM ARTERIOVENOUS (AV) GORE-TEX GRAFT (Left) as a surgical intervention.  The patient's history has been reviewed, patient examined, no change in status, stable for surgery.  I have reviewed the patient's chart and labs.  Questions were answered to the patient's satisfaction.     Annamarie Major

## 2021-04-04 ENCOUNTER — Encounter (HOSPITAL_COMMUNITY): Payer: Self-pay | Admitting: Surgery

## 2021-04-07 ENCOUNTER — Encounter (HOSPITAL_COMMUNITY): Payer: Self-pay

## 2021-04-07 ENCOUNTER — Other Ambulatory Visit: Payer: Self-pay

## 2021-04-07 ENCOUNTER — Other Ambulatory Visit (HOSPITAL_COMMUNITY): Payer: Medicare Other

## 2021-04-07 ENCOUNTER — Emergency Department (HOSPITAL_COMMUNITY): Payer: Medicare Other

## 2021-04-07 ENCOUNTER — Inpatient Hospital Stay (HOSPITAL_COMMUNITY): Payer: Medicare Other

## 2021-04-07 DIAGNOSIS — Z992 Dependence on renal dialysis: Secondary | ICD-10-CM

## 2021-04-07 DIAGNOSIS — Z9911 Dependence on respirator [ventilator] status: Secondary | ICD-10-CM | POA: Diagnosis not present

## 2021-04-07 DIAGNOSIS — E872 Acidosis: Secondary | ICD-10-CM | POA: Diagnosis present

## 2021-04-07 DIAGNOSIS — R680 Hypothermia, not associated with low environmental temperature: Secondary | ICD-10-CM | POA: Diagnosis not present

## 2021-04-07 DIAGNOSIS — I63422 Cerebral infarction due to embolism of left anterior cerebral artery: Secondary | ICD-10-CM | POA: Diagnosis not present

## 2021-04-07 DIAGNOSIS — Z7982 Long term (current) use of aspirin: Secondary | ICD-10-CM

## 2021-04-07 DIAGNOSIS — Z20822 Contact with and (suspected) exposure to covid-19: Secondary | ICD-10-CM | POA: Diagnosis present

## 2021-04-07 DIAGNOSIS — I132 Hypertensive heart and chronic kidney disease with heart failure and with stage 5 chronic kidney disease, or end stage renal disease: Secondary | ICD-10-CM | POA: Diagnosis present

## 2021-04-07 DIAGNOSIS — G8191 Hemiplegia, unspecified affecting right dominant side: Secondary | ICD-10-CM | POA: Diagnosis not present

## 2021-04-07 DIAGNOSIS — I10 Essential (primary) hypertension: Secondary | ICD-10-CM | POA: Diagnosis not present

## 2021-04-07 DIAGNOSIS — A419 Sepsis, unspecified organism: Secondary | ICD-10-CM | POA: Diagnosis not present

## 2021-04-07 DIAGNOSIS — I2510 Atherosclerotic heart disease of native coronary artery without angina pectoris: Secondary | ICD-10-CM

## 2021-04-07 DIAGNOSIS — R069 Unspecified abnormalities of breathing: Secondary | ICD-10-CM

## 2021-04-07 DIAGNOSIS — D631 Anemia in chronic kidney disease: Secondary | ICD-10-CM | POA: Diagnosis present

## 2021-04-07 DIAGNOSIS — E113592 Type 2 diabetes mellitus with proliferative diabetic retinopathy without macular edema, left eye: Secondary | ICD-10-CM | POA: Diagnosis present

## 2021-04-07 DIAGNOSIS — N2581 Secondary hyperparathyroidism of renal origin: Secondary | ICD-10-CM | POA: Diagnosis present

## 2021-04-07 DIAGNOSIS — K7200 Acute and subacute hepatic failure without coma: Secondary | ICD-10-CM | POA: Diagnosis not present

## 2021-04-07 DIAGNOSIS — R569 Unspecified convulsions: Secondary | ICD-10-CM | POA: Diagnosis not present

## 2021-04-07 DIAGNOSIS — Z79899 Other long term (current) drug therapy: Secondary | ICD-10-CM

## 2021-04-07 DIAGNOSIS — I214 Non-ST elevation (NSTEMI) myocardial infarction: Principal | ICD-10-CM

## 2021-04-07 DIAGNOSIS — R778 Other specified abnormalities of plasma proteins: Secondary | ICD-10-CM | POA: Diagnosis not present

## 2021-04-07 DIAGNOSIS — Z8249 Family history of ischemic heart disease and other diseases of the circulatory system: Secondary | ICD-10-CM

## 2021-04-07 DIAGNOSIS — N186 End stage renal disease: Secondary | ICD-10-CM | POA: Diagnosis present

## 2021-04-07 DIAGNOSIS — Z66 Do not resuscitate: Secondary | ICD-10-CM | POA: Diagnosis not present

## 2021-04-07 DIAGNOSIS — I63442 Cerebral infarction due to embolism of left cerebellar artery: Secondary | ICD-10-CM | POA: Diagnosis not present

## 2021-04-07 DIAGNOSIS — E875 Hyperkalemia: Secondary | ICD-10-CM

## 2021-04-07 DIAGNOSIS — Z4659 Encounter for fitting and adjustment of other gastrointestinal appliance and device: Secondary | ICD-10-CM

## 2021-04-07 DIAGNOSIS — I5042 Chronic combined systolic (congestive) and diastolic (congestive) heart failure: Secondary | ICD-10-CM | POA: Diagnosis present

## 2021-04-07 DIAGNOSIS — R531 Weakness: Secondary | ICD-10-CM

## 2021-04-07 DIAGNOSIS — Z9861 Coronary angioplasty status: Secondary | ICD-10-CM

## 2021-04-07 DIAGNOSIS — R570 Cardiogenic shock: Secondary | ICD-10-CM | POA: Diagnosis not present

## 2021-04-07 DIAGNOSIS — I959 Hypotension, unspecified: Secondary | ICD-10-CM | POA: Diagnosis present

## 2021-04-07 DIAGNOSIS — J9690 Respiratory failure, unspecified, unspecified whether with hypoxia or hypercapnia: Secondary | ICD-10-CM

## 2021-04-07 DIAGNOSIS — R7989 Other specified abnormal findings of blood chemistry: Secondary | ICD-10-CM

## 2021-04-07 DIAGNOSIS — I472 Ventricular tachycardia: Secondary | ICD-10-CM | POA: Diagnosis not present

## 2021-04-07 DIAGNOSIS — R197 Diarrhea, unspecified: Secondary | ICD-10-CM | POA: Diagnosis not present

## 2021-04-07 DIAGNOSIS — Z955 Presence of coronary angioplasty implant and graft: Secondary | ICD-10-CM

## 2021-04-07 DIAGNOSIS — Z515 Encounter for palliative care: Secondary | ICD-10-CM | POA: Diagnosis not present

## 2021-04-07 DIAGNOSIS — I6340 Cerebral infarction due to embolism of unspecified cerebral artery: Secondary | ICD-10-CM | POA: Diagnosis not present

## 2021-04-07 DIAGNOSIS — G9349 Other encephalopathy: Secondary | ICD-10-CM | POA: Diagnosis not present

## 2021-04-07 DIAGNOSIS — E1122 Type 2 diabetes mellitus with diabetic chronic kidney disease: Secondary | ICD-10-CM | POA: Diagnosis present

## 2021-04-07 DIAGNOSIS — I255 Ischemic cardiomyopathy: Secondary | ICD-10-CM | POA: Diagnosis not present

## 2021-04-07 DIAGNOSIS — E119 Type 2 diabetes mellitus without complications: Secondary | ICD-10-CM | POA: Diagnosis not present

## 2021-04-07 DIAGNOSIS — I6310 Cerebral infarction due to embolism of unspecified precerebral artery: Secondary | ICD-10-CM | POA: Diagnosis not present

## 2021-04-07 DIAGNOSIS — R579 Shock, unspecified: Secondary | ICD-10-CM

## 2021-04-07 DIAGNOSIS — M898X9 Other specified disorders of bone, unspecified site: Secondary | ICD-10-CM | POA: Diagnosis present

## 2021-04-07 DIAGNOSIS — Z7984 Long term (current) use of oral hypoglycemic drugs: Secondary | ICD-10-CM

## 2021-04-07 DIAGNOSIS — Z825 Family history of asthma and other chronic lower respiratory diseases: Secondary | ICD-10-CM

## 2021-04-07 DIAGNOSIS — J9601 Acute respiratory failure with hypoxia: Secondary | ICD-10-CM | POA: Diagnosis not present

## 2021-04-07 DIAGNOSIS — Z794 Long term (current) use of insulin: Secondary | ICD-10-CM

## 2021-04-07 DIAGNOSIS — E785 Hyperlipidemia, unspecified: Secondary | ICD-10-CM | POA: Diagnosis present

## 2021-04-07 DIAGNOSIS — Z5329 Procedure and treatment not carried out because of patient's decision for other reasons: Secondary | ICD-10-CM | POA: Diagnosis not present

## 2021-04-07 DIAGNOSIS — I4819 Other persistent atrial fibrillation: Secondary | ICD-10-CM | POA: Diagnosis present

## 2021-04-07 DIAGNOSIS — R7401 Elevation of levels of liver transaminase levels: Secondary | ICD-10-CM

## 2021-04-07 DIAGNOSIS — Z7901 Long term (current) use of anticoagulants: Secondary | ICD-10-CM

## 2021-04-07 DIAGNOSIS — R1310 Dysphagia, unspecified: Secondary | ICD-10-CM | POA: Diagnosis not present

## 2021-04-07 DIAGNOSIS — Z833 Family history of diabetes mellitus: Secondary | ICD-10-CM

## 2021-04-07 DIAGNOSIS — D72829 Elevated white blood cell count, unspecified: Secondary | ICD-10-CM | POA: Diagnosis not present

## 2021-04-07 DIAGNOSIS — R4182 Altered mental status, unspecified: Secondary | ICD-10-CM | POA: Diagnosis not present

## 2021-04-07 DIAGNOSIS — R6521 Severe sepsis with septic shock: Secondary | ICD-10-CM | POA: Diagnosis not present

## 2021-04-07 LAB — CBC
HCT: 36.9 % (ref 36.0–46.0)
Hemoglobin: 11.2 g/dL — ABNORMAL LOW (ref 12.0–15.0)
MCH: 27.1 pg (ref 26.0–34.0)
MCHC: 30.4 g/dL (ref 30.0–36.0)
MCV: 89.3 fL (ref 80.0–100.0)
Platelets: 328 10*3/uL (ref 150–400)
RBC: 4.13 MIL/uL (ref 3.87–5.11)
RDW: 16.7 % — ABNORMAL HIGH (ref 11.5–15.5)
WBC: 8.3 10*3/uL (ref 4.0–10.5)
nRBC: 0.2 % (ref 0.0–0.2)

## 2021-04-07 LAB — TROPONIN I (HIGH SENSITIVITY)
Troponin I (High Sensitivity): 1320 ng/L (ref <18)
Troponin I (High Sensitivity): 1605 ng/L (ref <18)
Troponin I (High Sensitivity): 2745 ng/L (ref <18)
Troponin I (High Sensitivity): 5564 ng/L (ref <18)

## 2021-04-07 LAB — CBG MONITORING, ED
Glucose-Capillary: 109 mg/dL — ABNORMAL HIGH (ref 70–99)
Glucose-Capillary: 85 mg/dL (ref 70–99)

## 2021-04-07 LAB — RESP PANEL BY RT-PCR (FLU A&B, COVID) ARPGX2
Influenza A by PCR: NEGATIVE
Influenza B by PCR: NEGATIVE
SARS Coronavirus 2 by RT PCR: NEGATIVE

## 2021-04-07 LAB — HEPARIN LEVEL (UNFRACTIONATED): Heparin Unfractionated: 0.49 IU/mL (ref 0.30–0.70)

## 2021-04-07 LAB — PROTIME-INR
INR: 1.2 (ref 0.8–1.2)
Prothrombin Time: 15.2 seconds (ref 11.4–15.2)

## 2021-04-07 LAB — LIPASE, BLOOD: Lipase: 26 U/L (ref 11–51)

## 2021-04-07 LAB — LACTIC ACID, PLASMA
Lactic Acid, Venous: 3.1 mmol/L (ref 0.5–1.9)
Lactic Acid, Venous: 2.9 mmol/L (ref 0.5–1.9)

## 2021-04-07 MED ORDER — ASPIRIN 325 MG PO TABS: 325.0000 mg | ORAL_TABLET | Freq: Every day | ORAL | Status: DC

## 2021-04-07 MED ORDER — SODIUM CHLORIDE 0.9 % IV SOLN: INTRAVENOUS | Status: DC

## 2021-04-07 MED ORDER — NITROGLYCERIN 2 % TD OINT
0.5000 [in_us] | TOPICAL_OINTMENT | Freq: Once | TRANSDERMAL | Status: AC
  Administered 2021-04-07: 0.5 [in_us] via TOPICAL
  Filled 2021-04-07: qty 1

## 2021-04-07 MED ORDER — ASPIRIN 81 MG PO CHEW
81.0000 mg | CHEWABLE_TABLET | ORAL | Status: AC
Start: 2021-04-08 — End: 2021-04-08
  Administered 2021-04-08: 81 mg via ORAL
  Filled 2021-04-07: qty 1

## 2021-04-07 MED ORDER — SODIUM CHLORIDE 0.9 % IV SOLN: 250.0000 mL | INTRAVENOUS | Status: DC | PRN

## 2021-04-07 MED ORDER — OXYCODONE HCL 5 MG PO TABS
5.0000 mg | ORAL_TABLET | ORAL | Status: DC | PRN
  Administered 2021-04-08: 5 mg via ORAL
  Filled 2021-04-07: qty 1

## 2021-04-07 MED ORDER — ASPIRIN EC 81 MG PO TBEC: 81.0000 mg | DELAYED_RELEASE_TABLET | Freq: Every day | ORAL | Status: DC

## 2021-04-07 MED ORDER — SODIUM CHLORIDE 0.9% FLUSH
3.0000 mL | Freq: Two times a day (BID) | INTRAVENOUS | Status: DC
Start: 2021-04-07 — End: 2021-04-15
  Administered 2021-04-07 – 2021-04-14 (×9): 3 mL via INTRAVENOUS

## 2021-04-07 MED ORDER — HYDRALAZINE HCL 25 MG PO TABS
25.0000 mg | ORAL_TABLET | Freq: Three times a day (TID) | ORAL | Status: DC
  Administered 2021-04-07 – 2021-04-08 (×3): 25 mg via ORAL
  Filled 2021-04-07 (×3): qty 1

## 2021-04-07 MED ORDER — ISOSORBIDE MONONITRATE ER 30 MG PO TB24
30.0000 mg | ORAL_TABLET | Freq: Every day | ORAL | Status: DC
  Administered 2021-04-07: 30 mg via ORAL
  Filled 2021-04-07: qty 1

## 2021-04-07 MED ORDER — NALOXONE HCL 0.4 MG/ML IJ SOLN
0.2000 mg | Freq: Once | INTRAMUSCULAR | Status: AC
  Administered 2021-04-07: 0.2 mg via INTRAVENOUS
  Filled 2021-04-07: qty 1

## 2021-04-07 MED ORDER — ONDANSETRON HCL 4 MG PO TABS: 4.0000 mg | ORAL_TABLET | Freq: Four times a day (QID) | ORAL | Status: DC | PRN

## 2021-04-07 MED ORDER — SODIUM ZIRCONIUM CYCLOSILICATE 10 G PO PACK
10.0000 g | PACK | Freq: Three times a day (TID) | ORAL | Status: DC
  Administered 2021-04-07 (×3): 10 g via ORAL
  Filled 2021-04-07 (×6): qty 1

## 2021-04-07 MED ORDER — INSULIN ASPART 100 UNIT/ML IJ SOLN: 0.0000 [IU] | Freq: Every day | INTRAMUSCULAR | Status: DC

## 2021-04-07 MED ORDER — ACETAMINOPHEN 650 MG RE SUPP: 650.0000 mg | Freq: Four times a day (QID) | RECTAL | Status: DC | PRN

## 2021-04-07 MED ORDER — OXYCODONE HCL 5 MG PO TABS
5.0000 mg | ORAL_TABLET | ORAL | Status: DC | PRN
Start: 2021-04-07 — End: 2021-04-07

## 2021-04-07 MED ORDER — SODIUM CHLORIDE 0.9% FLUSH: 3.0000 mL | INTRAVENOUS | Status: DC | PRN

## 2021-04-07 MED ORDER — HEPARIN BOLUS VIA INFUSION
4000.0000 [IU] | Freq: Once | INTRAVENOUS | Status: AC
  Administered 2021-04-07: 4000 [IU] via INTRAVENOUS
  Filled 2021-04-07: qty 4000

## 2021-04-07 MED ORDER — HEPARIN (PORCINE) 25000 UT/250ML-% IV SOLN
1150.0000 [IU]/h | INTRAVENOUS | Status: DC
  Administered 2021-04-07 – 2021-04-08 (×2): 1000 [IU]/h via INTRAVENOUS
  Filled 2021-04-07 (×2): qty 250

## 2021-04-07 MED ORDER — CARVEDILOL 25 MG PO TABS
25.0000 mg | ORAL_TABLET | Freq: Two times a day (BID) | ORAL | Status: DC
  Administered 2021-04-07 (×2): 25 mg via ORAL
  Filled 2021-04-07 (×2): qty 8

## 2021-04-07 MED ORDER — ONDANSETRON HCL 4 MG/2ML IJ SOLN: 4.0000 mg | Freq: Four times a day (QID) | INTRAMUSCULAR | Status: DC | PRN

## 2021-04-07 MED ORDER — ASPIRIN 81 MG PO CHEW
324.0000 mg | CHEWABLE_TABLET | Freq: Once | ORAL | Status: AC
  Administered 2021-04-07: 324 mg via ORAL
  Filled 2021-04-07: qty 4

## 2021-04-07 MED ORDER — INSULIN ASPART 100 UNIT/ML IJ SOLN
0.0000 [IU] | Freq: Three times a day (TID) | INTRAMUSCULAR | Status: DC
  Administered 2021-04-10 (×2): 1 [IU] via SUBCUTANEOUS

## 2021-04-07 MED ORDER — ATORVASTATIN CALCIUM 80 MG PO TABS
80.0000 mg | ORAL_TABLET | Freq: Every day | ORAL | Status: DC
  Administered 2021-04-07: 80 mg via ORAL
  Filled 2021-04-07: qty 2

## 2021-04-07 MED ORDER — ACETAMINOPHEN 325 MG PO TABS: 650.0000 mg | ORAL_TABLET | Freq: Four times a day (QID) | ORAL | Status: DC | PRN

## 2021-04-07 NOTE — Assessment & Plan Note (Signed)
Pt given 1 dose of lokelma. Nephrology has been consulted for HD.

## 2021-04-07 NOTE — Assessment & Plan Note (Signed)
Last known EF 35% in 2020. Check echo.

## 2021-04-07 NOTE — Assessment & Plan Note (Signed)
Continue aspirin and statin.  Continue Coreg.

## 2021-04-07 NOTE — Subjective & Objective (Signed)
CC: "feeling bad" HPI: 65 year old African-American female with a history of type 2 diabetes, coronary disease with a prior history of drug-eluting stent, end-stage renal disease on hemodialysis, history of ischemic cardiomyopathy last reported ejection fraction of 35% in November 2020, presented to the ER today with complaints of weakness, decreased appetite and "feeling bad".  Patient underwent AV graft placement on 04/03/2021.  I did call the patient's daughter [name] who states the patient did have surgery on Thursday.  Patient has not eaten anything all weekend.  Patient's been taking oxycodone for pain.  Patient missed her dialysis session today.  Family has been giving the patient water and Pedialyte to drink due to the patient not having any appetite.  Daughter states that when the patient was taking oxycodone for the left arm pain, she has been quite sleepy.  In the ER, patient noted to be afebrile.  Her EKG was concerning for ST segment depression in inferior lateral leads specifically 2, 3, aVF, V4 V5 V6.  Patient's initial troponin was elevated at 1320.  Her second troponin I drawn approximately 2 hours later was increasing at 1605.  Cardiology, nephrology have both been consulted.  Due to the patient's elevated troponin and need for further medical evaluation, [facility] hospitalist consulted for admission.  Patient is unable to give any meaningful history or review of systems.  All she can state is "I am feeling bad.  Please help me."  There is no family at bedside.

## 2021-04-07 NOTE — ED Provider Notes (Signed)
[facility] EMERGENCY DEPARTMENT Provider Note   CSN: [id number] Arrival date & time: 04/09/2021  0732     History No chief complaint on file.   Amber Young is a 64 y.o. female.  65 year old female with prior medical history as detailed below presents for evaluation.  Patient complains of generalized fatigue.  She reports that she missed dialysis today.  She is unable to provide exact details of her last dialysis session.  Patient's symptoms appear to a started 2 days prior.  Patient is status post recent placement of AV graft in the left upper extremity on August 25.  Prior documentation suggest that the patient is a dialysis patient on Monday, Wednesday, and Friday.  She denies recent fever.  She denies chest pain.  She denies shortness of breath.  The history is provided by the patient.  Illness Location:  Weakness, fatigue Severity:  Mild Onset quality:  Gradual Duration:  2 days Timing:  Unable to specify Progression:  Unable to specify Chronicity:  New     Past Medical History:  Diagnosis Date   Anemia    Atrial fibrillation ([place]) 06/17/2019   Cataract, bilateral    Chronic combined systolic and diastolic CHF (congestive heart failure) ([place])    a. EF 40-45% in 2019 b. EF 25-30% by repeat echo in 01/2019   CKD (chronic kidney disease), stage IV ([place])    Coronary artery disease 08/2017   DES x 2 RCA   Dialysis patient ([place]) 07/2019   DKA (diabetic ketoacidoses) 06/16/2019   GERD (gastroesophageal reflux disease)    Glaucoma, left eye    Heart murmur    Hyperlipidemia    Hypertension    Proliferative diabetic retinopathy ([place])    left eye with vitreous hemorrhage and tractional retinal detachment   Stroke ([place]) 09/2014   numbness left upper lip, finger tips on left hand; "resolved" (05/18/2016)   Type II diabetes mellitus ([place])    Wears glasses     Patient Active Problem List   Diagnosis Date Noted   Shortness of breath 03/13/2020    Encounter for removal of sutures 01/12/2020   Fluid overload, unspecified 10/27/2019   Hemodialysis patient ([place]) 08/17/2019   Type 2 diabetes mellitus with diabetic peripheral angiopathy without gangrene ([place]) 08/06/2019   Anemia in chronic kidney disease 07/20/2019   Secondary hyperparathyroidism of renal origin ([place]) 07/20/2019   Allergy, unspecified, initial encounter [id number]   Complication of vascular dialysis catheter 07/14/2019   End stage renal disease ([place]) 07/14/2019   Iron deficiency anemia, unspecified 07/14/2019   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) 123456   Acute metabolic encephalopathy 123456   Acute encephalopathy 06/16/2019   Hemoglobin A1C greater than 9%, indicating poor diabetic control 02/06/2019   Glaucoma of left eye 01/23/2019   Diabetic retinopathy of both eyes with macular edema associated with type 2 diabetes mellitus ([place]) 01/23/2019   Cataract of both eyes 01/23/2019   Intractable headache 01/23/2019   Dizziness 01/23/2019   Type 2 diabetes mellitus without complication, with long-term current use of insulin ([place]) 12/01/2018   Left breast mass 12/01/2018   Edema, peripheral 12/01/2018   Visual problems 12/01/2018   Recent weight loss 12/01/2018   Essential hypertension    Coronary artery disease  involving native coronary artery of native heart with unstable angina pectoris ([place])    Chronic anticoagulation 01/06/2017   Breast abscess 12/20/2016   Encounter for therapeutic drug monitoring 05/29/2016   CAD S/P PCI mLAD DES (Promus 3.0 x 24 -- 3.25 mm) 05/19/2016   PAF (paroxysmal atrial fibrillation) ([place]) 05/19/2016   Cardiomyopathy, ischemic     Class: Diagnosis of   Microcytic anemia  04/26/2016   Acute on chronic combined systolic (congestive) and diastolic (congestive) heart failure ([place]) 11/14/2015   Hypokalemia 11/14/2015   Uncontrolled hypertension 09/30/2014   Uncontrolled diabetes mellitus ([place]) 09/30/2014   History of stroke 09/30/2014    Past Surgical History:  Procedure Laterality Date   AV FISTULA PLACEMENT Left 07/10/2019   Procedure: ARTERIOVENOUS (AV) FISTULA CREATION;  Surgeon: Angelia Mould, MD;  Location: [facility];  Service: Vascular;  Laterality: Left;   AV FISTULA PLACEMENT Left 04/03/2021   Procedure: INSERTION OF LEFT UPPER ARM ARTERIOVENOUS (AV) GORE-TEX GRAFT;  Surgeon: Serafina Mitchell, MD;  Location: [facility];  Service: Vascular;  Laterality: Left;   CARDIAC CATHETERIZATION N/A 05/18/2016   Procedure: Left Heart Cath and Coronary Angiography;  Surgeon: Jettie Booze, MD;  Location: [facility];  Service: Cardiovascular;  Laterality: N/A;   CARDIAC CATHETERIZATION N/A 05/18/2016   Procedure: Coronary Stent Intervention;  Surgeon: Jettie Booze, MD;  Location: [facility];  Service: Cardiovascular;  Laterality: N/A;   CATARACT EXTRACTION Right 2020   COLONOSCOPY W/ BIOPSIES AND POLYPECTOMY     CORONARY ANGIOPLASTY     CORONARY ANGIOPLASTY WITH STENT PLACEMENT     CORONARY STENT INTERVENTION N/A 09/07/2017   Procedure: CORONARY STENT INTERVENTION;  Surgeon: Leonie Man, MD;  Location: [facility];  Service: Cardiovascular;  Laterality: N/A;   EYE SURGERY     GAS INSERTION Left 01/13/2018   Procedure: INSERTION OF GAS;  Surgeon: Bernarda Caffey, MD;  Location: [facility];  Service: Ophthalmology;  Laterality: Left;   INSERTION OF DIALYSIS CATHETER Right 07/10/2019   Procedure: INSERTION OF TUNNELED DIALYSIS CATHETER;  Surgeon: Angelia Mould, MD;  Location: [facility];  Service: Vascular;  Laterality: Right;   IR FLUORO GUIDE CV LINE RIGHT  07/05/2019   IR US GUIDE VASC ACCESS RIGHT  07/05/2019   LEFT HEART CATH  AND CORONARY ANGIOGRAPHY N/A 09/07/2017   Procedure: LEFT HEART CATH AND CORONARY ANGIOGRAPHY;  Surgeon: Leonie Man, MD;  Location: [facility];  Service: Cardiovascular;  Laterality: N/A;   MEMBRANE PEEL Left 01/13/2018   Procedure: MEMBRANE PEEL LEFT EYE ;  Surgeon: Bernarda Caffey, MD;  Location: [facility];  Service: Ophthalmology;  Laterality: Left;   MULTIPLE TOOTH EXTRACTIONS     PARS PLANA VITRECTOMY Left 01/13/2018   Procedure: PARS PLANA VITRECTOMY WITH 25 GAUGE LEFT EYE WITH ENDOLASER;  Surgeon: Bernarda Caffey, MD;  Location: [facility];  Service: Ophthalmology;  Laterality: Left;   [place]   "partial; fibroids"     OB History     Gravida  2   Para      Term      Preterm      AB      Living  2      SAB      IAB      Ectopic      Multiple      Live Births  Family History  Problem Relation Age of Onset   Diabetes Mother    Hypertension Mother    COPD Mother    Heart failure Mother    Hypertension Sister    Hypertension Brother     Social History   Tobacco Use   Smoking status: Never   Smokeless tobacco: Never  Vaping Use   Vaping Use: Never used  Substance Use Topics   Alcohol use: No    Alcohol/week: 0.0 standard drinks   Drug use: No    Home Medications Prior to Admission medications   Medication Sig Start Date End Date Taking? Authorizing Provider  oxyCODONE-acetaminophen (PERCOCET) 5-325 MG tablet Take 1 tablet by mouth every 6 (six) hours as needed for severe pain. 04/03/21 04/03/22  Dagoberto Ligas, PA-C  acetaminophen (TYLENOL) 500 MG tablet Take 2 tablets (1,000 mg total) by mouth every 6 (six) hours as needed for mild pain or headache. 07/10/19   Azzie Glatter, FNP  ALPRAZolam Duanne Moron) 0.25 MG tablet Take 1 tablet (0.25 mg total) by mouth 2 (two) times daily as needed for anxiety. Patient not taking: No sig reported 07/13/19   Swayze, Ava, DO  amLODipine (NORVASC) 10 MG tablet  TAKE 1 TABLET (10 MG TOTAL) BY MOUTH DAILY. 08/09/19   Azzie Glatter, FNP  aspirin EC 81 MG tablet Take 81 mg by mouth daily.    [provider]  atorvastatin (LIPITOR) 40 MG tablet TAKE 1 TABLET BY MOUTH DAILY AT 6PM Patient taking differently: Take 40 mg by mouth daily. 07/23/20 07/23/21  Azzie Glatter, FNP  carvedilol (COREG) 25 MG tablet TAKE 1 TABLET BY MOUTH TWO TIMES DAILY Patient taking differently: Take 25 mg by mouth 2 (two) times daily. 07/23/20 07/23/21  Azzie Glatter, FNP  feeding supplement, ENSURE ENLIVE, (ENSURE ENLIVE) LIQD Take 237 mLs by mouth 2 (two) times daily between meals. Patient not taking: No sig reported 07/14/19   Swayze, Ava, DO  ferric citrate (AURYXIA) 1 GM 210 MG(Fe) tablet Take 210 mg by mouth 3 (three) times daily with meals. 09/01/19   [provider]  ferrous sulfate (FERROUSUL) 325 (65 FE) MG tablet Take 1 tablet (325 mg total) by mouth every other day. Patient taking differently: Take 325 mg by mouth daily. 06/27/19   Azzie Glatter, FNP  glipiZIDE (GLUCOTROL) 10 MG tablet TAKE 1 TABLET BY MOUTH TWO TIMES DAILY BEFORE MEALS Patient taking differently: Take 10 mg by mouth 2 (two) times daily before a meal. 07/23/20 07/23/21  Azzie Glatter, FNP  hydrALAZINE (APRESOLINE) 50 MG tablet TAKE 1 TABLET (50 MG TOTAL) BY MOUTH 2 (TWO) TIMES DAILY. PATIENT NEEDS APPOINTMENT FOR ANY FUTURE REFILLS. Patient taking differently: Take 50 mg by mouth in the morning and at bedtime. 10/21/20 10/21/21  Skeet Latch, MD  Insulin Glargine (LANTUS) 100 UNIT/ML Solostar Pen Inject 4 Units into the skin daily. Patient taking differently: Inject 4 Units into the skin daily as needed. In morning if needed 06/27/19   Azzie Glatter, FNP  Insulin Pen Needle (TRUEPLUS PEN NEEDLES) 31G X 6 MM MISC USE AS DIRECTED 06/27/19   Azzie Glatter, FNP  isosorbide mononitrate (IMDUR) 30 MG 24 hr tablet Take 1 tablet (30 mg total) by mouth daily. 07/14/19    Swayze, Ava, DO  multivitamin (RENA-VIT) TABS tablet Take 1 tablet by mouth at bedtime. 07/13/19   Swayze, Ava, DO  nitroGLYCERIN (NITROSTAT) 0.4 MG SL tablet Place 1 tablet (0.4 mg total) under the tongue every  5 (five) minutes x 3 doses as needed for chest pain. 06/27/19   Azzie Glatter, FNP  ondansetron (ZOFRAN ODT) 4 MG disintegrating tablet Take 1 tablet (4 mg total) by mouth every 8 (eight) hours as needed for nausea or vomiting. Patient not taking: No sig reported 09/07/20   Noemi Chapel, MD  ondansetron (ZOFRAN-ODT) 4 MG disintegrating tablet TAKE 1 TABLET (4 MG TOTAL) BY MOUTH EVERY 8 (EIGHT) HOURS AS NEEDED FOR NAUSEA OR VOMITING. Patient not taking: No sig reported 09/07/20 09/07/21  Domenic Moras, PA-C  warfarin (COUMADIN) 5 MG tablet TAKE 1 TABLET BY MOUTH 4 TIMES A WEEK ON TUESDAY, THURSDAY, SATURDAY, AND SUNDAY. (TAKE 7.'5MG'$  ON MONDAY, WEDNESDAY, AND FRIDAY.) Patient taking differently: Take 5-7.5 mg by mouth See admin instructions. Mon Wed Fri, 7.5 mg, all other days 5 mg 09/03/20 09/03/21  Azzie Glatter, FNP    Allergies    Ace inhibitors  Review of Systems   Review of Systems  All other systems reviewed and are negative.  Physical Exam Updated Vital Signs BP (!) 155/86   Pulse 92   Temp 97.9 F (36.6 C) (Oral)   Resp 16   SpO2 100%   Physical Exam Vitals and nursing note reviewed.  Constitutional:      General: She is not in acute distress.    Appearance: Normal appearance. She is well-developed.  HENT:     Head: Normocephalic and atraumatic.  Eyes:     Conjunctiva/sclera: Conjunctivae normal.     Pupils: Pupils are equal, round, and reactive to light.  Cardiovascular:     Rate and Rhythm: Normal rate and regular rhythm.     Heart sounds: Normal heart sounds.  Pulmonary:     Effort: Pulmonary effort is normal. No respiratory distress.     Breath sounds: Normal breath sounds.  Abdominal:     General: There is no distension.     Palpations: Abdomen is  soft.     Tenderness: There is no abdominal tenderness.  Musculoskeletal:        General: No deformity. Normal range of motion.     Cervical back: Normal range of motion and neck supple.     Comments: Left upper extremity with recent AV graft placement.  See photos below.  Patient with dopplerable radial pulse in the distal left upper extremity.  Skin:    General: Skin is warm and dry.  Neurological:     General: No focal deficit present.     Mental Status: She is alert and oriented to person, place, and time.          ED Results / Procedures / Treatments   Labs (all labs ordered are listed, but only abnormal results are displayed) Labs Reviewed  CBC - Abnormal; Notable for the following components:      Result Value   Hemoglobin 11.2 (*)    RDW 16.7 (*)    All other components within normal limits  RESP PANEL BY RT-PCR (FLU A&B, COVID) [id number]  CULTURE, BLOOD (ROUTINE X 2)  CULTURE, BLOOD (ROUTINE X 2)  BASIC METABOLIC PANEL  LIPASE, BLOOD  URINALYSIS, ROUTINE W REFLEX MICROSCOPIC  PROTIME-INR  LACTIC ACID, PLASMA  LACTIC ACID, PLASMA  TROPONIN I (HIGH SENSITIVITY)    EKG EKG Interpretation  Date/Time:  Monday April 07 2021 08:10:09 EDT Ventricular Rate:  91 PR Interval:  174 QRS Duration: 88 QT Interval:  388 QTC Calculation: 477 R Axis:   5 Text Interpretation: Normal sinus rhythm Possible  Left atrial enlargement Left ventricular hypertrophy with repolarization abnormality ( Cornell product ) Cannot rule out Septal infarct , age undetermined Abnormal ECG Confirmed by Dene Gentry [phone]) on 03/28/2021 9:17:40 AM  Radiology DG Chest 2 View  Result Date: 03/21/2021 CLINICAL DATA:  Weakness, decreased appetite, chest pain EXAM: CHEST - 2 VIEW COMPARISON:  Chest radiograph 09/07/2020 FINDINGS: A left-sided dialysis catheter is in place terminating over the right atrium. The heart is mildly enlarged. The mediastinal contours are within normal limits. Lung  volumes are low. There is no focal consolidation or pulmonary edema. There is no pleural effusion or pneumothorax. The bones are unremarkable. A left axillary vascular stent is noted. IMPRESSION: Mild cardiomegaly and low lung volumes. Otherwise, no radiographic evidence of acute cardiopulmonary process. Electronically Signed   By: Valetta Mole M.D.   On: 04/03/2021 08:34    Procedures Procedures   Medications Ordered in ED Medications - No data to display  ED Course  I have reviewed the triage vital signs and the nursing notes.  Pertinent labs & imaging results that were available during my care of the patient were reviewed by me and considered in my medical decision making (see chart for details).    MDM Rules/Calculators/A&P                           CRITICAL CARE Performed by: Valarie Merino   Total critical care time: 30 minutes  Critical care time was exclusive of separately billable procedures and treating other patients.  Critical care was necessary to treat or prevent imminent or life-threatening deterioration.  Critical care was time spent personally by me on the following activities: development of treatment plan with patient and/or surrogate as well as nursing, discussions with consultants, evaluation of patient's response to treatment, examination of patient, obtaining history from patient or surrogate, ordering and performing treatments and interventions, ordering and review of laboratory studies, ordering and review of radiographic studies, pulse oximetry and re-evaluation of patient's condition.    MDM  MSE complete  Amber Young was evaluated in Emergency Department on 03/31/2021 for the symptoms described in the history of present illness. She was evaluated in the context of the global COVID-19 pandemic, which necessitated consideration that the patient might be at risk for infection with the SARS-CoV-2 virus that causes COVID-19. Institutional protocols and  algorithms that pertain to the evaluation of patients at risk for COVID-19 are in a state of rapid change based on information released by regulatory bodies including the CDC and federal and state organizations. These policies and algorithms were followed during the patient's care in the ED.  [place] with [facility] is aware of elevated troponin and cardiology will consult.   38 Dr. Justin Mend, Nephrology, aware of case. Agrees with [place]. Will consult.  Patient presented for evaluation of reported weakness times 2 days.  Patient without complaint of chest pain.  Initial EKG was concerning.  This was reviewed by Dr. Almyra Free ([place]) and also discussed with cardiology - at 0830.  Initial troponin is elevated. Patient with known ESRD - Cr today 8.12.  Potassium is also elevated at 6.  Both cardiology and nephrology are aware of patient's case.  Both will consult.  Hospitalist service is aware of patient and will evaluate for admission.  Vascular service is also aware of patient's case and need for admission.  Heparin drip and aspirin administered in the ED.     Final Clinical Impression(s) /  ED Diagnoses Final diagnoses:  Weakness  Elevated troponin  ESRD (end stage renal disease) ([place])    Rx / DC Orders ED Discharge Orders     None        Valarie Merino, MD 04/06/2021 1205

## 2021-04-07 NOTE — Assessment & Plan Note (Signed)
+   Scale insulin.  Keep patient NPO.

## 2021-04-07 NOTE — Assessment & Plan Note (Signed)
Stable for now.  Patient will continue Nitropaste and Coreg.

## 2021-04-07 NOTE — Assessment & Plan Note (Signed)
Admit to cardiac telemetry.  Cardiology has been consulted.  Patient started on a heparin drip.  Patient was given 325 mg of aspirin in the ER.  We will check lipid panel.  Monitor serial troponins.  Order echocardiogram.  Patient may need invasive ischemic evaluation.  Keep the patient n.p.o. for now.  Continue high-dose statin.  Verified patient's full CODE STATUS with the patient and her daughter Ms. Adah Perl.

## 2021-04-07 NOTE — Consult Note (Signed)
Cardiology Consultation:   Patient ID: Amber Young MRN: [id number]; DOB: 12/05/1955  Admit date: 04/03/2021 Date of Consult: 03/21/2021  PCP:  Merryl Hacker No   [facility] Providers Cardiologist:  Skeet Latch, MD   {   Patient Profile:   Amber Young is a 65 y.o. female with a hx of CAD s/p LAD PCI in 2017 and overlapping DES to RCA in 2019, paroxysmal atrial fibrillation, chronic combined CHF, hypertension, hyperlipidemia, prior stroke and end-stage renal disease on hemodialysis who is being seen 04/09/2021 for the evaluation of non-STEMI at the request of Dr. Bridgett Larsson.  Last cardiac catheterization in January 2019 showing severe ostial and proximal RCA stenosis s/p difficult but successful overlapping DES.  Medical therapy for nonobstructive LAD and diagonal disease.  See cath report below.    Last echocardiogram November 2020 with LV function of 35 to [id number], grade 2 diastolic dysfunction.    Last seen by Dr. Oval Linsey February 2021.  History of Present Illness:   Ms. Jenkins underwent left upper arm AV graft placement on August 25.  Since then she was complaining of left arm pain and getting oxycodone.  She had normal dialysis on Friday.  However, since then she has progressively got weaker with no appetite.  Last meal Friday evening.  Patient lives with daughter.  Family was pushing with fluids due to lack of appetite.  Mostly drowsy and sleepy.  Last night patient had chest discomfort and shortness of breath.  She could not lay down flat.  Slept on recliner.  Given change in her status family brought her to ER for evaluation.  During my evaluation patient is very lethargic.  Barely involving any conversation.  History provided by daughter at bedside and reviewing chart.  At the end of my visit, she was  only able to tell me that she is having chest pain.  Lactic acid 3.1 High sensitive troponin 1320>>1605> another level in progress Potassium 6 Creatinine 8.12 Hemoglobin  11.2 Respiratory panel negative for influenza and COVID Chest x-ray without acute cardiopulmonary disease   Past Medical History:  Diagnosis Date   Anemia    Atrial fibrillation ([place]) 06/17/2019   Cataract, bilateral    Chronic combined systolic and diastolic CHF (congestive heart failure) ([place])    a. EF 40-45% in 2019 b. EF 25-30% by repeat echo in 01/2019   CKD (chronic kidney disease), stage IV (HCC)    Coronary artery disease 08/2017   DES x 2 RCA   Dialysis patient ([place]) 07/2019   DKA (diabetic ketoacidoses) 06/16/2019   GERD (gastroesophageal reflux disease)    Glaucoma, left eye    Heart murmur    Hyperlipidemia    Hypertension    Proliferative diabetic retinopathy ([place])    left eye with vitreous hemorrhage and tractional retinal detachment   Stroke ([place]) 09/2014   numbness left upper lip, finger tips on left hand; "resolved" (05/18/2016)   Type II diabetes mellitus ([place])    Wears glasses     Past Surgical History:  Procedure Laterality Date   AV FISTULA PLACEMENT Left 07/10/2019   Procedure: ARTERIOVENOUS (AV) FISTULA CREATION;  Surgeon: Angelia Mould, MD;  Location: [facility] OR;  Service: Vascular;  Laterality: Left;   AV FISTULA PLACEMENT Left 04/03/2021   Procedure: INSERTION OF LEFT UPPER ARM ARTERIOVENOUS (AV) GORE-TEX GRAFT;  Surgeon: Serafina Mitchell, MD;  Location: [place];  Service: Vascular;  Laterality: Left;   CARDIAC CATHETERIZATION N/A 05/18/2016   Procedure: Left Heart Cath and Coronary Angiography;  Surgeon: Jettie Booze, MD;  Location: [place] [facility] LAB;  Service: Cardiovascular;  Laterality: N/A;   CARDIAC CATHETERIZATION N/A 05/18/2016   Procedure: Coronary Stent Intervention;  Surgeon: Jettie Booze, MD;  Location: [place] [facility];  Service: Cardiovascular;  Laterality: N/A;   CATARACT EXTRACTION Right 2020   COLONOSCOPY W/ BIOPSIES AND POLYPECTOMY     CORONARY ANGIOPLASTY     CORONARY ANGIOPLASTY WITH STENT PLACEMENT     CORONARY  STENT INTERVENTION N/A 09/07/2017   Procedure: CORONARY STENT INTERVENTION;  Surgeon: Leonie Man, MD;  Location: [place] [facility];  Service: Cardiovascular;  Laterality: N/A;   EYE SURGERY     GAS INSERTION Left 01/13/2018   Procedure: INSERTION OF GAS;  Surgeon: Bernarda Caffey, MD;  Location: [facility];  Service: Ophthalmology;  Laterality: Left;   INSERTION OF DIALYSIS CATHETER Right 07/10/2019   Procedure: INSERTION OF TUNNELED DIALYSIS CATHETER;  Surgeon: Angelia Mould, MD;  Location: [facility] OR;  Service: Vascular;  Laterality: Right;   IR FLUORO GUIDE CV LINE RIGHT  07/05/2019   IR US GUIDE VASC ACCESS RIGHT  07/05/2019   LEFT HEART CATH AND CORONARY ANGIOGRAPHY N/A 09/07/2017   Procedure: LEFT HEART CATH AND CORONARY ANGIOGRAPHY;  Surgeon: Leonie Man, MD;  Location: [place] [facility];  Service: Cardiovascular;  Laterality: N/A;   MEMBRANE PEEL Left 01/13/2018   Procedure: MEMBRANE PEEL LEFT EYE ;  Surgeon: Bernarda Caffey, MD;  Location: [facility];  Service: Ophthalmology;  Laterality: Left;   MULTIPLE TOOTH EXTRACTIONS     PARS PLANA VITRECTOMY Left 01/13/2018   Procedure: PARS PLANA VITRECTOMY WITH 25 GAUGE LEFT EYE WITH ENDOLASER;  Surgeon: Bernarda Caffey, MD;  Location: [facility];  Service: Ophthalmology;  Laterality: Left;   [place]   "partial; fibroids"       Inpatient Medications: Scheduled Meds:  [START ON 03/15/2021] aspirin  325 mg Oral Daily   atorvastatin  80 mg Oral Daily   carvedilol  25 mg Oral BID   insulin aspart  0-5 Units Subcutaneous QHS   insulin aspart  0-6 Units Subcutaneous TID WC   sodium zirconium cyclosilicate  10 g Oral TID   Continuous Infusions:  heparin 1,000 Units/hr (03/31/2021 1107)   PRN Meds:   Allergies:    Allergies  Allergen Reactions   Ace Inhibitors Cough    Social History:   Social History   Socioeconomic History   Marital status: Married    Spouse name: Not on file   Number of  children: Not on file   Years of education: 16   Highest education level: Not on file  Occupational History   Occupation: hairdresser  Tobacco Use   Smoking status: Never   Smokeless tobacco: Never  Vaping Use   Vaping Use: Never used  Substance and Sexual Activity   Alcohol use: No    Alcohol/week: 0.0 standard drinks   Drug use: No   Sexual activity: Yes  Other Topics Concern   Not on file  Social History Narrative   Married, 2 children, hairdresser   Caffeine use- occasional tea or coffee   Right handed   Social Determinants of Health   Financial Resource Strain: Not on file  Food Insecurity: Not on file  Transportation Needs: Not on file  Physical Activity: Not on file  Stress: Not on file  Social Connections: Not on file  Intimate Partner Violence: Not on file  Family History:   Family History  Problem Relation Age of Onset   Diabetes Mother    Hypertension Mother    COPD Mother    Heart failure Mother    Hypertension Sister    Hypertension Brother      ROS:  Please see the history of present illness.  All other ROS reviewed and negative.     Physical Exam/Data:   Vitals:   03/15/2021 1345 03/27/2021 1400 03/23/2021 1430 03/18/2021 1500  BP: (!) 153/88 (!) 156/80 (!) 154/80 (!) 159/83  Pulse: 81 81 81 82  Resp: 16 (!) 31 16 (!) 21  Temp:      TempSrc:      SpO2: 100% 96% 98% 98%   No intake or output data in the 24 hours ending 04/04/2021 1541 Last 3 Weights 04/03/2021 03/20/2021 06/28/2020  Weight (lbs) 183 lb 183 lb 183 lb 3.2 oz  Weight (kg) 83.008 kg 83.008 kg 83.099 kg     There is no height or weight on file to calculate BMI.  General: Ill-appearing female in no acute distress HEENT: normal Lymph: no adenopathy Neck: + JVD Endocrine:  No thryomegaly Vascular: No carotid bruits; FA pulses 2+ bilaterally without bruits  Cardiac:  normal S1, S2; RRR; no murmur  Lungs: Unable to auscultate properly as patient is barely following commands abd: soft,  nontender, no hepatomegaly  Ext: Trace edema Musculoskeletal:  No deformities Skin: warm and dry  Neuro:  no focal abnormalities noted, very lethargic, barely following any commands, opens eyes with painful stimuli Psych: Drowsy  EKG:  The EKG was personally reviewed and demonstrates: Sinus rhythm, LVH, inferior lateral ST depression Telemetry:  Telemetry was personally reviewed and demonstrates: Sinus rhythm  Relevant CV Studies:  Echo 06/2019  1. Left ventricular ejection fraction, by visual estimation, is 35 to  40%. The left ventricle has moderately decreased function. There is mildly  increased left ventricular hypertrophy.   2. Left ventricular diastolic parameters are consistent with Grade II  diastolic dysfunction (pseudonormalization).   3. Mildly dilated left ventricular internal cavity size.   4. Diffuse hypokinesis worse in septum.   5. Global right ventricle has normal systolic function.The right  ventricular size is normal. No increase in right ventricular wall  thickness.   6. Left atrial size was moderately dilated.   7. Right atrial size was normal.   8. The mitral valve is normal in structure. Mild mitral valve  regurgitation.   9. The tricuspid valve is normal in structure. Tricuspid valve  regurgitation mild-moderate.  10. The aortic valve is tricuspid. Aortic valve regurgitation is not  visualized. Mild aortic valve sclerosis without stenosis.  11. The pulmonic valve was grossly normal. Pulmonic valve regurgitation is  mild.  12. Moderately elevated pulmonary artery systolic pressure.   CORONARY STENT INTERVENTION  08/2017  LEFT HEART CATH AND CORONARY ANGIOGRAPHY   Conclusion    Prox RCA lesion is 75% stenosed. A drug-eluting stent was successfully placed using a STENT PROMUS PREM MR 2.75X16. Post intervention, there is a 5% residual stenosis. Ost RCA lesion is 85% stenosed. A drug-eluting stent was successfully placed using a STENT PROMUS PREM MR  3.0X12. Overlaps Stent 1 proximally. Post intervention, there is a 0% residual stenosis. Mid RCA-1 lesion is 30% stenosed. Mid RCA-2 lesion is 25% stenosed. Ost 1st Diag lesion is 30% stenosed. Ost 2nd Diag to 2nd Diag lesion is 80% stenosed. Stable. Prox LAD lesion is 40% stenosed. Previously placed Mid LAD Promus drug eluting  stent is widely patent. There is mild left ventricular systolic dysfunction. The left ventricular ejection fraction is 45-50% by visual estimate. LV end diastolic pressure is mildly elevated.   Severe Ostial & proximal RCA stenosis (progression of disease from Oct 2017).  Successful PCI using 2 overlapping DES.  Difficult, ostial lesion PCI requiring multiple balloons, buddy wire & extra support wire.   Plan:  Overnight monitoring in the posterior unit (6C) TR band removal per protocol Continue DAPT -at least 1 more year\ Continue aggressive risk factor modification.   Expect discharge tomorrow. Diagnostic Dominance: Right Intervention    Laboratory Data:  High Sensitivity Troponin:   Recent Labs  Lab 03/19/2021 0806 03/12/2021 0930  TROPONINIHS 1,320* 1,605*     Chemistry Recent Labs  Lab 04/03/21 0641 03/10/2021 0806  NA 138 137  K 3.6 6.0*  CL 98 98  CO2  --  17*  GLUCOSE 155* 73  BUN 28* 57*  CREATININE 4.50* 8.12*  CALCIUM  --  9.7  GFRNONAA  --  5*  ANIONGAP  --  22*     Hematology Recent Labs  Lab 04/03/21 0641 03/11/2021 0806  WBC  --  8.3  RBC  --  4.13  HGB 14.3 11.2*  HCT 42.0 36.9  MCV  --  89.3  MCH  --  27.1  MCHC  --  30.4  RDW  --  16.7*  PLT  --  328    Radiology/Studies:  DG Chest 2 View  Result Date: 04/09/2021 CLINICAL DATA:  Weakness, decreased appetite, chest pain EXAM: CHEST - 2 VIEW COMPARISON:  Chest radiograph 09/07/2020 FINDINGS: A left-sided dialysis catheter is in place terminating over the right atrium. The heart is mildly enlarged. The mediastinal contours are within normal limits. Lung volumes are  low. There is no focal consolidation or pulmonary edema. There is no pleural effusion or pneumothorax. The bones are unremarkable. A left axillary vascular stent is noted. IMPRESSION: Mild cardiomegaly and low lung volumes. Otherwise, no radiographic evidence of acute cardiopulmonary process. Electronically Signed   By: Valetta Mole M.D.   On: 04/04/2021 08:34     Assessment and Plan:   Non-STEMI with hx CAD s/p LAD PCI in 2019 & overlapping DES to RCA in 2019 -Patient is very drowsy and barely following commands.  However she was able to tell me that she is having chest pain. High sensitive troponin 1320>>1605> another level in progress.  EKG seems more pronounced ST depression in lateral leads. -Last cardiac catheterization in 2019 as above -Continue IV heparin -Resume home aspirin, Imdur carvedilol and statin - Likely need ischemic evaluation this admission. Get echo.   2.  Chronic combined CHF -Chest x-ray without acute cardiopulmonary disease - Last echocardiogram November 2020 with LV function of 35 to [id number], grade 2 diastolic dysfunction.   -Volume Managed by dialysis -Continue carvedilol and Imdur - Add home hydralazine as blood pressure allows - Not on ACE/ARB or Enteresto due to ESRD  3. ESRD on HD (MWF) - Due to dialysis today   4. PAF -Maintaining sinus rhythm -On warfarin for anticoagulation>> hold>> on heparin - INR 1.2  5. AMS - ? Etiology - Lactic acid 3.1  Risk Assessment/Risk Scores:  [id number]}   TIMI Risk Score for Unstable Angina or Non-ST Elevation MI:   The patient's TIMI risk score is 6, which indicates a 41% risk of all cause mortality, new or recurrent myocardial infarction or need for urgent revascularization in the next 14 days.  New York Heart Association (NYHA) Functional Class NYHA Class II  CHA2DS2-VASc Score = 7  This indicates a 11.2% annual risk of stroke. The patient's score is based upon: CHF History: Yes HTN History: Yes Diabetes History:  Yes Stroke History: Yes Vascular Disease History: Yes Age Score: 0 Gender Score: 1    For questions or updates, please contact [place] Please consult [email or web address] for contact info under    Jarrett Soho, PA  03/16/2021 3:41 PM

## 2021-04-07 NOTE — Assessment & Plan Note (Signed)
Dr. Justin Mend with nephrology is aware of the patient's need for admission.  Patient has missed dialysis today.  Daughter states the patient did have dialysis on Friday.

## 2021-04-07 NOTE — Plan of Care (Signed)
Met with pt's husband and pt at bedside. Pt's mentation has been waxing and waning most of the day.  Per pt's husband(Leon), pt's mentation normally waxes and wanes throughout the day. This is normal for the patient. Husband states has only taken oxycodone sparingly. Pt still complaining of pain everywhere. Pt cannot localized her pain which the husband states is normal for her.  Pt still having LLQ tenderness. Will proceed with CT abd/pelvis to evaluate for intra-abd process.  Procalcitonin pending.

## 2021-04-07 NOTE — ED Triage Notes (Signed)
Patient here with complaint of weakness and decreased appetite x 2 days. Was due for dialysis today and felt too bad to go. Patient alert but appears sleepy.

## 2021-04-07 NOTE — ED Provider Notes (Signed)
   While the patient was in the waiting room, I was brought the patient's EKG.  EKG appeared concerning for diffuse ST depressions with elevations in aVR and V1.  However upon review of prior EKGs she had very similar patterns in the past very close to her baseline EKG pattern although perhaps worse today.    The patient otherwise does not have a complaint of chest pain but complaining of generalized fatigue and decreased appetite.  EKG discussed with on-call STEMI physician.  No activation, will pursue medical management.   Luna Fuse, MD 03/14/2021 [phone]

## 2021-04-07 NOTE — ED Notes (Signed)
Pt confused. While being medicated she had to be redirected several times saying "I dont want water" when it was meds I was trying to give her. Pt very sleepy on exam

## 2021-04-07 NOTE — ED Notes (Signed)
DO updated RN that pt not candidate to go to cath lab at the time d/t being too sleepy. Pt will be given narcan as ordered to see if neuro status improves. Will continue to monitor pt

## 2021-04-07 NOTE — Plan of Care (Signed)
I have reviewed CT abd/pelvis myself, still waiting for official report. I do not see any free air, or evidence of bowel obstruction.  I gave a phone update to the patient's husband Amber Young. Pt remains on IV heparin. Waiting for bed to become available.

## 2021-04-07 NOTE — Progress Notes (Signed)
ANTICOAGULATION CONSULT NOTE  Pharmacy Consult for heparin Indication: chest pain/ACS  Allergies  Allergen Reactions   Ace Inhibitors Cough    Patient Measurements:   Heparin Dosing Weight: 79 kg   Vital Signs: Temp: 97.9 F (36.6 C) (08/29 2117) Temp Source: Oral (08/29 2117) BP: 115/58 (08/29 2117) Pulse Rate: 55 (08/29 2117)  Labs: Recent Labs    04/06/2021 0806 03/26/2021 0930 03/15/2021 1442 03/11/2021 2035  HGB 11.2*  --   --   --   HCT 36.9  --   --   --   PLT 328  --   --   --   LABPROT  --  15.2  --   --   INR  --  1.2  --   --   HEPARINUNFRC  --   --   --  0.49  CREATININE 8.12*  --   --   --   TROPONINIHS 1,320* 1,605* 2,745*  --      Estimated Creatinine Clearance: 7.8 mL/min (A) (by C-G formula based on SCr of 8.12 mg/dL (H)).   Assessment: 79 YOF who presents with weakness and decreased appetite. Troponin elevated concerning for NSTEMI. Pharmacy consulted to start IV heparin. Of note, patient is on warfarin at home for h/o Afib.   INR on admission is 1.2. Hgb mildly low. Plt wnl. ESRD on HD   8/29 pm: heparin level is therapeutic at 0.49 on 1000 units/hr. No bleeding noted. Possible cath tomorrow.  Goal of Therapy:  Heparin level 0.3-0.7 units/ml Monitor platelets by anticoagulation protocol: Yes   Plan:  -Continue heparin infusion at 1000 units/hr -Heparin level in am -Monitor daily heparin level, CBC and s/s of bleeding   Thank you for involving pharmacy in this patient's care.  Renold Genta, PharmD, BCPS Clinical Pharmacist Clinical phone for 04/03/2021 until 10p is [phone] 03/24/2021 9:29 PM  **Pharmacist phone directory can be found on [email or web address] listed under [place]**

## 2021-04-07 NOTE — Consult Note (Signed)
[facility] Renal Consultation Note  Indication for Consultation:  Management of ESRD/hemodialysis; anemia, hypertension/volume and secondary hyperparathyroidism  HPI: Amber Young is a 65 y.o. 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  Patient recently had right upper arm AV Gore-Tex graft inserted 04/03/21, as an outpatient had EDW decreased 0.5 kg secondary to weight loss.  She has been compliant with HD except today missed it she was feeling " bad" ,fatigued/generalized weakness and came to the ER for evaluation.  Seen in ER with Daughter giving  most of history. She is afebrile.  EKG concerning for ST depression inferior leads initial troponin 1320-second elevated more, found to have potassium 6.0 Family was giving her Pedialyte because she was not eating or drinking much recently.  We are consulted for ESRD /HD support as discussed with Dr. Justin Mend hold HD today and setting of ongoing non-STEMI /treating hyperkalemia with Lokelma.  Chest x-ray shows no acute volume overload and 100 percent O2 sat on room air and on presentation.      Past Medical History:  Diagnosis Date   Anemia    Atrial fibrillation ([place]) 06/17/2019   Cataract, bilateral    Chronic combined systolic and diastolic CHF (congestive heart failure) ([place])    a. EF 40-45% in 2019 b. EF 25-30% by repeat echo in 01/2019   CKD (chronic kidney disease), stage IV (HCC)    Coronary artery disease 08/2017   DES x 2 RCA   Dialysis patient ([place]) 07/2019   DKA (diabetic ketoacidoses) 06/16/2019   GERD (gastroesophageal reflux disease)    Glaucoma, left eye    Heart murmur    Hyperlipidemia    Hypertension    Proliferative diabetic retinopathy ([place])    left eye with vitreous hemorrhage and tractional retinal detachment    Stroke ([place]) 09/2014   numbness left upper lip, finger tips on left hand; "resolved" (05/18/2016)   Type II diabetes mellitus ([place])    Wears glasses     Past Surgical History:  Procedure Laterality Date   AV FISTULA PLACEMENT Left 07/10/2019   Procedure: ARTERIOVENOUS (AV) FISTULA CREATION;  Surgeon: Angelia Mould, MD;  Location: [place];  Service: Vascular;  Laterality: Left;   AV FISTULA PLACEMENT Left 04/03/2021   Procedure: INSERTION OF LEFT UPPER ARM ARTERIOVENOUS (AV) GORE-TEX GRAFT;  Surgeon: Serafina Mitchell, MD;  Location: [place];  Service: Vascular;  Laterality: Left;   CARDIAC CATHETERIZATION N/A 05/18/2016   Procedure: Left Heart Cath and Coronary Angiography;  Surgeon: Jettie Booze, MD;  Location: [place] [facility];  Service: Cardiovascular;  Laterality: N/A;   CARDIAC CATHETERIZATION N/A 05/18/2016   Procedure: Coronary Stent Intervention;  Surgeon: Jettie Booze, MD;  Location: [place] [facility];  Service: Cardiovascular;  Laterality: N/A;   CATARACT EXTRACTION Right 2020   COLONOSCOPY W/ BIOPSIES AND POLYPECTOMY     CORONARY ANGIOPLASTY     CORONARY ANGIOPLASTY WITH STENT PLACEMENT     CORONARY STENT INTERVENTION N/A 09/07/2017   Procedure: CORONARY STENT INTERVENTION;  Surgeon: Leonie Man, MD;  Location: [place] [facility];  Service: Cardiovascular;  Laterality: N/A;   EYE SURGERY     GAS INSERTION Left 01/13/2018   Procedure: INSERTION OF GAS;  Surgeon: Bernarda Caffey, MD;  Location: [facility];  Service: Ophthalmology;  Laterality:  Left;   INSERTION OF DIALYSIS CATHETER Right 07/10/2019   Procedure: INSERTION OF TUNNELED DIALYSIS CATHETER;  Surgeon: Angelia Mould, MD;  Location: [facility] OR;  Service: Vascular;  Laterality: Right;   IR FLUORO GUIDE CV LINE RIGHT  07/05/2019   IR US GUIDE VASC ACCESS RIGHT  07/05/2019   LEFT HEART CATH AND CORONARY ANGIOGRAPHY N/A 09/07/2017   Procedure: LEFT HEART CATH AND CORONARY ANGIOGRAPHY;  Surgeon: Leonie Man, MD;  Location: [place] [facility];  Service: Cardiovascular;  Laterality: N/A;   MEMBRANE PEEL Left 01/13/2018   Procedure: MEMBRANE PEEL LEFT EYE ;  Surgeon: Bernarda Caffey, MD;  Location: [place];  Service: Ophthalmology;  Laterality: Left;   MULTIPLE TOOTH EXTRACTIONS     PARS PLANA VITRECTOMY Left 01/13/2018   Procedure: PARS PLANA VITRECTOMY WITH 25 GAUGE LEFT EYE WITH ENDOLASER;  Surgeon: Bernarda Caffey, MD;  Location: [place];  Service: Ophthalmology;  Laterality: Left;   [place]   "partial; fibroids"      Family History  Problem Relation Age of Onset   Diabetes Mother    Hypertension Mother    COPD Mother    Heart failure Mother    Hypertension Sister    Hypertension Brother       reports that she has never smoked. She has never used smokeless tobacco. She reports that she does not drink alcohol and does not use drugs.   Allergies  Allergen Reactions   Ace Inhibitors Cough    Prior to Admission medications   Medication Sig Start Date End Date Taking? Authorizing Provider  oxyCODONE-acetaminophen (PERCOCET) 5-325 MG tablet Take 1 tablet by mouth every 6 (six) hours as needed for severe pain. 04/03/21 04/03/22  Dagoberto Ligas, PA-C  acetaminophen (TYLENOL) 500 MG tablet Take 2 tablets (1,000 mg total) by mouth every 6 (six) hours as needed for mild pain or headache. 07/10/19   Azzie Glatter, FNP  ALPRAZolam Duanne Moron) 0.25 MG tablet Take 1 tablet (0.25 mg total) by mouth 2 (two) times daily as needed for anxiety. Patient not taking: No sig reported 07/13/19   Swayze, Ava, DO  amLODipine (NORVASC) 10 MG tablet TAKE 1 TABLET (10 MG TOTAL) BY MOUTH DAILY. 08/09/19   Azzie Glatter, FNP  aspirin EC 81 MG tablet Take 81 mg by mouth daily.    [provider]  atorvastatin (LIPITOR) 40 MG tablet TAKE 1 TABLET BY MOUTH DAILY AT 6PM Patient taking differently: Take 40 mg by mouth daily. 07/23/20 07/23/21  Azzie Glatter, FNP   carvedilol (COREG) 25 MG tablet TAKE 1 TABLET BY MOUTH TWO TIMES DAILY Patient taking differently: Take 25 mg by mouth 2 (two) times daily. 07/23/20 07/23/21  Azzie Glatter, FNP  feeding supplement, ENSURE ENLIVE, (ENSURE ENLIVE) LIQD Take 237 mLs by mouth 2 (two) times daily between meals. Patient not taking: No sig reported 07/14/19   Swayze, Ava, DO  ferric citrate (AURYXIA) 1 GM 210 MG(Fe) tablet Take 210 mg by mouth 3 (three) times daily with meals. 09/01/19   [provider]  ferrous sulfate (FERROUSUL) 325 (65 FE) MG tablet Take 1 tablet (325 mg total) by mouth every other day. Patient taking differently: Take 325 mg by mouth daily. 06/27/19   Azzie Glatter, FNP  glipiZIDE (GLUCOTROL) 10 MG tablet TAKE 1 TABLET BY MOUTH TWO TIMES DAILY BEFORE MEALS Patient taking differently: Take 10 mg by mouth 2 (two) times daily before a  meal. 07/23/20 07/23/21  Azzie Glatter, FNP  hydrALAZINE (APRESOLINE) 50 MG tablet TAKE 1 TABLET (50 MG TOTAL) BY MOUTH 2 (TWO) TIMES DAILY. PATIENT NEEDS APPOINTMENT FOR ANY FUTURE REFILLS. Patient taking differently: Take 50 mg by mouth in the morning and at bedtime. 10/21/20 10/21/21  Skeet Latch, MD  Insulin Glargine (LANTUS) 100 UNIT/ML Solostar Pen Inject 4 Units into the skin daily. Patient taking differently: Inject 4 Units into the skin daily as needed. In morning if needed 06/27/19   Azzie Glatter, FNP  Insulin Pen Needle (TRUEPLUS PEN NEEDLES) 31G X 6 MM MISC USE AS DIRECTED 06/27/19   Azzie Glatter, FNP  isosorbide mononitrate (IMDUR) 30 MG 24 hr tablet Take 1 tablet (30 mg total) by mouth daily. 07/14/19   Swayze, Ava, DO  multivitamin (RENA-VIT) TABS tablet Take 1 tablet by mouth at bedtime. 07/13/19   Swayze, Ava, DO  nitroGLYCERIN (NITROSTAT) 0.4 MG SL tablet Place 1 tablet (0.4 mg total) under the tongue every 5 (five) minutes x 3 doses as needed for chest pain. 06/27/19   Azzie Glatter, FNP  ondansetron (ZOFRAN ODT) 4 MG  disintegrating tablet Take 1 tablet (4 mg total) by mouth every 8 (eight) hours as needed for nausea or vomiting. Patient not taking: No sig reported 09/07/20   Noemi Chapel, MD  ondansetron (ZOFRAN-ODT) 4 MG disintegrating tablet TAKE 1 TABLET (4 MG TOTAL) BY MOUTH EVERY 8 (EIGHT) HOURS AS NEEDED FOR NAUSEA OR VOMITING. Patient not taking: No sig reported 09/07/20 09/07/21  Domenic Moras, PA-C  warfarin (COUMADIN) 5 MG tablet TAKE 1 TABLET BY MOUTH 4 TIMES A WEEK ON TUESDAY, THURSDAY, SATURDAY, AND SUNDAY. (TAKE 7.'5MG'$  ON MONDAY, WEDNESDAY, AND FRIDAY.) Patient taking differently: Take 5-7.5 mg by mouth See admin instructions. Mon Wed Fri, 7.5 mg, all other days 5 mg 09/03/20 09/03/21  Azzie Glatter, FNP      Results for orders placed or performed during the hospital encounter of 03/13/2021 (from the past 48 hour(s))  Basic metabolic panel     Status: Abnormal   Collection Time: 03/20/2021  8:06 AM  Result Value Ref Range   Sodium 137 135 - 145 mmol/L   Potassium 6.0 (H) 3.5 - 5.1 mmol/L   Chloride 98 98 - 111 mmol/L   CO2 17 (L) 22 - 32 mmol/L   Glucose, Bld 73 70 - 99 mg/dL    Comment: Glucose reference range applies only to samples taken after fasting for at least 8 hours.   BUN 57 (H) 8 - 23 mg/dL   Creatinine, Ser 8.12 (H) 0.44 - 1.00 mg/dL   Calcium 9.7 8.9 - 10.3 mg/dL   GFR, Estimated 5 (L) >60 mL/min    Comment: (NOTE) Calculated using the CKD-EPI Creatinine Equation (2021)    Anion gap 22 (H) 5 - 15    Comment: Performed at [facility] [address], [place], [place] [address]  CBC     Status: Abnormal   Collection Time: 03/30/2021  8:06 AM  Result Value Ref Range   WBC 8.3 4.0 - 10.5 K/uL   RBC 4.13 3.87 - 5.11 MIL/uL   Hemoglobin 11.2 (L) 12.0 - 15.0 g/dL   HCT 36.9 36.0 - 46.0 %   MCV 89.3 80.0 - 100.0 fL   MCH 27.1 26.0 - 34.0 pg   MCHC 30.4 30.0 - 36.0 g/dL   RDW 16.7 (H) 11.5 - 15.5 %   Platelets 328 150 - 400 K/uL  nRBC 0.2 0.0 - 0.2 %    Comment: Performed  at [facility], [place] [address], [place], [place] [address]  Troponin I (High Sensitivity)     Status: Abnormal   Collection Time: 04/03/2021  8:06 AM  Result Value Ref Range   Troponin I (High Sensitivity) 1,320 (HH) <18 ng/L    Comment: CRITICAL RESULT CALLED TO, READ BACK BY AND VERIFIED WITH: B BECK RN BY SSTEPHENS [phone] [id number] (NOTE) Elevated high sensitivity troponin I (hsTnI) values and significant  changes across serial measurements may suggest ACS but many other  chronic and acute conditions are known to elevate hsTnI results.  Refer to the Links section for chest pain algorithms and additional  guidance. Performed at [facility], [place] [address], [place], [place] [address]   Lipase, blood     Status: None   Collection Time: 03/16/2021  8:06 AM  Result Value Ref Range   Lipase 26 11 - 51 U/L    Comment: Performed at [facility] [address], [place], [place] [address]  Protime-INR     Status: None   Collection Time: 03/22/2021  9:30 AM  Result Value Ref Range   Prothrombin Time 15.2 11.4 - 15.2 seconds   INR 1.2 0.8 - 1.2    Comment: (NOTE) INR goal varies based on device and disease states. Performed at [facility], [place] [address] [place], [place] [address]   Lactic acid, plasma     Status: Abnormal   Collection Time: 03/18/2021  9:30 AM  Result Value Ref Range   Lactic Acid, Venous 3.1 (HH) 0.5 - 1.9 mmol/L    Comment: CRITICAL RESULT CALLED TO, READ BACK BY AND VERIFIED WITH: B BECK RN [phone] BY A BENNETT Performed at [facility], [place] [address], [place], [place] [address]   Troponin I (High Sensitivity)     Status: Abnormal   Collection Time: 04/05/2021  9:30 AM  Result Value Ref Range   Troponin I (High Sensitivity) 1,605 (HH) <18 ng/L    Comment: CRITICAL VALUE NOTED.  VALUE IS CONSISTENT WITH PREVIOUSLY REPORTED AND CALLED VALUE. (NOTE) Elevated high sensitivity troponin I (hsTnI) values and significant  changes across serial  measurements may suggest ACS but many other  chronic and acute conditions are known to elevate hsTnI results.  Refer to the Links section for chest pain algorithms and additional  guidance. Performed at [facility], [place] [address] [place], [place] [address]   Resp Panel by RT-PCR (Flu A&B, Covid) Nasopharyngeal Swab     Status: None   Collection Time: 03/15/2021 10:04 AM   Specimen: Nasopharyngeal Swab; Nasopharyngeal(NP) swabs in vial transport medium  Result Value Ref Range   SARS Coronavirus 2 by RT PCR NEGATIVE NEGATIVE    Comment: (NOTE) SARS-CoV-2 target nucleic acids are NOT DETECTED.  The SARS-CoV-2 RNA is generally detectable in upper respiratory specimens during the acute phase of infection. The lowest concentration of SARS-CoV-2 viral copies this assay can detect is 138 copies/mL. A negative result does not preclude SARS-Cov-2 infection and should not be used as the sole basis for treatment or other patient management decisions. A negative result may occur with  improper specimen collection/handling, submission of specimen other than nasopharyngeal swab, presence of viral mutation(s) within the areas targeted by this assay, and inadequate number of viral copies(<138 copies/mL). A negative result must be combined with clinical observations, patient history, and epidemiological information. The expected  result is Negative.  Fact Sheet for Patients:  EntrepreneurPulse.com.au  Fact Sheet for Healthcare Providers:  IncredibleEmployment.be  This test is no t yet approved or cleared by the [place] FDA and  has been authorized for detection and/or diagnosis of SARS-CoV-2 by FDA under an Emergency Use Authorization (EUA). This EUA will remain  in effect (meaning this test can be used) for the duration of the COVID-19 declaration under Section 564(b)(1) of the Act, 21 U.S.C.section 360bbb-3(b)(1), unless the authorization is  terminated  or revoked sooner.       Influenza A by PCR NEGATIVE NEGATIVE   Influenza B by PCR NEGATIVE NEGATIVE    Comment: (NOTE) The Xpert Xpress SARS-CoV-2/FLU/RSV plus assay is intended as an aid in the diagnosis of influenza from Nasopharyngeal swab specimens and should not be used as a sole basis for treatment. Nasal washings and aspirates are unacceptable for Xpert Xpress SARS-CoV-2/FLU/RSV testing.  Fact Sheet for Patients: EntrepreneurPulse.com.au  Fact Sheet for Healthcare Providers: IncredibleEmployment.be  This test is not yet approved or cleared by the [place] FDA and has been authorized for detection and/or diagnosis of SARS-CoV-2 by FDA under an Emergency Use Authorization (EUA). This EUA will remain in effect (meaning this test can be used) for the duration of the COVID-19 declaration under Section 564(b)(1) of the Act, 21 U.S.C. section 360bbb-3(b)(1), unless the authorization is terminated or revoked.  Performed at [facility], [place] [address], [place], [place] [address]      ROS: See HPI  Physical Exam: Vitals:   03/11/2021 1145 03/10/2021 1200  BP: (!) 155/91 (!) 152/70  Pulse: 91 83  Resp: 19 18  Temp:    SpO2: 99% 100%     General: Alert , soft spoken, Elderly Female NAD ,  HEENT: [place], eomi, [place], Not icteric,MMM Neck: no jvd Heart: RRR, no MRG Lungs: CTA, nonlabored on room air Abdomen: NABS, Soft, NT, ND Extremities: nop pedal edema Skin: No overt rash, warm dry ,no pedla ulcers Neuro: Flat Affect, Moves all extrem to requests, no overt focal deficits noted for her  Dialysis Access: R IJ P Cath, LUA AVGG Pos. Bruit , min swelling (post op 8/25)  Dialysis Orders: [facility] MWF 4HR 15MIN, 3K, 2.ca , EDW 70.0 kg just lowered half kilogram  Right IJ PC ,LUA AVGG inserted 7/22/220 Mircera 60 MCG on 03/19/21 2000 units heparin heparin Calcitriol 1.75 MCG    Assessment/Plan Non-STEMI= plan per  admit/cardiology consulted ESRD -HD MWF hold HD today in the setting of ongoing non-STEMI, treat mild hyperkalemia with Lokelma no excess volume Hypertension/volume  -chest x-ray no excess volume, BP 155/91 on outpatient BP meds amlodipine, Coreg and hydralazine continue // may be able to taper down with volume UF on hemo follow trend Mild hyperkalemia= K6.0 as noted above treatment with Lokelma/family was giving Pedialyte also follow-up K.trend was on 3K bath as an outpatient History of ischemic cardiomyopathy EF 35% in 2020= team checking echo cardiology following Anemia of ESRD-Hgb 11.2 no ESA as a needs now  Metabolic bone disease -try on hemodialysis binders when taking p.o.'s with meals Type 2 diabetes mellitus= per admit team Nutrition -when eating carb modified/renal with fluid restriction  Ernest Haber, PA-C [place] [phone] 03/14/2021, 1:27 PM

## 2021-04-07 NOTE — H&P (Addendum)
History and Physical    Amber Young [id number] DOB: 02-16-1956 DOA: 03/12/2021  PCP: Pcp, No   Patient coming from: Home  I have personally briefly reviewed patient's old medical records in [place]  CC: "feeling bad" HPI: 65 year old African-American female with a history of type 2 diabetes, coronary disease with a prior history of drug-eluting stent, end-stage renal disease on hemodialysis, history of ischemic cardiomyopathy last reported ejection fraction of 35% in November 2020, presented to the ER today with complaints of weakness, decreased appetite and "feeling bad".  Patient underwent AV graft placement on 04/03/2021.  I did call the patient's daughter Amber Young who states the patient did have surgery on Thursday.  Patient has not eaten anything all weekend.  Patient's been taking oxycodone for pain.  Patient missed her dialysis session today.  Family has been giving the patient water and Pedialyte to drink due to the patient not having any appetite.  Daughter states that when the patient was taking oxycodone for the left arm pain, she has been quite sleepy.  In the ER, patient noted to be afebrile.  Her EKG was concerning for ST segment depression in inferior lateral leads specifically 2, 3, aVF, V4 V5 V6.  Patient's initial troponin was elevated at 1320.  Her second troponin I drawn approximately 2 hours later was increasing at 1605.  Cardiology, nephrology have both been consulted.  Due to the patient's elevated troponin and need for further medical evaluation, [facility] hospitalist consulted for admission.  Patient is unable to give any meaningful history or review of systems.  All she can state is "I am feeling bad.  Please help me."  There is no family at bedside.   ED Course: given 324 mg ASA, started on IV heparin gtts. 1/2 in NTG paste placed on her.  Review of Systems:  Review of Systems  Unable to perform ROS: Medical condition  Pt unable to give  meaningful history or ROS. Pt just keeps stating "I feel bad. Help me".  Past Medical History:  Diagnosis Date   Anemia    Atrial fibrillation ([place]) 06/17/2019   Cataract, bilateral    Chronic combined systolic and diastolic CHF (congestive heart failure) ([place])    a. EF 40-45% in 2019 b. EF 25-30% by repeat echo in 01/2019   CKD (chronic kidney disease), stage IV (HCC)    Coronary artery disease 08/2017   DES x 2 RCA   Dialysis patient ([place]) 07/2019   DKA (diabetic ketoacidoses) 06/16/2019   GERD (gastroesophageal reflux disease)    Glaucoma, left eye    Heart murmur    Hyperlipidemia    Hypertension    Proliferative diabetic retinopathy ([place])    left eye with vitreous hemorrhage and tractional retinal detachment   Stroke ([place]) 09/2014   numbness left upper lip, finger tips on left hand; "resolved" (05/18/2016)   Type II diabetes mellitus ([place])    Wears glasses     Past Surgical History:  Procedure Laterality Date   AV FISTULA PLACEMENT Left 07/10/2019   Procedure: ARTERIOVENOUS (AV) FISTULA CREATION;  Surgeon: Angelia Mould, MD;  Location: [place];  Service: Vascular;  Laterality: Left;   AV FISTULA PLACEMENT Left 04/03/2021   Procedure: INSERTION OF LEFT UPPER ARM ARTERIOVENOUS (AV) GORE-TEX GRAFT;  Surgeon: Serafina Mitchell, MD;  Location: [place];  Service: Vascular;  Laterality: Left;   CARDIAC CATHETERIZATION N/A 05/18/2016   Procedure: Left Heart Cath and Coronary Angiography;  Surgeon: Jettie Booze, MD;  Location: [place] [facility];  Service: Cardiovascular;  Laterality: N/A;   CARDIAC CATHETERIZATION N/A 05/18/2016   Procedure: Coronary Stent Intervention;  Surgeon: Jettie Booze, MD;  Location: [facility] [place] [facility];  Service: Cardiovascular;  Laterality: N/A;   CATARACT EXTRACTION Right 2020   COLONOSCOPY W/ BIOPSIES AND POLYPECTOMY     CORONARY ANGIOPLASTY     CORONARY ANGIOPLASTY WITH STENT PLACEMENT     CORONARY STENT INTERVENTION N/A 09/07/2017    Procedure: CORONARY STENT INTERVENTION;  Surgeon: Leonie Man, MD;  Location: [facility] [facility];  Service: Cardiovascular;  Laterality: N/A;   EYE SURGERY     GAS INSERTION Left 01/13/2018   Procedure: INSERTION OF GAS;  Surgeon: Bernarda Caffey, MD;  Location: [facility];  Service: Ophthalmology;  Laterality: Left;   INSERTION OF DIALYSIS CATHETER Right 07/10/2019   Procedure: INSERTION OF TUNNELED DIALYSIS CATHETER;  Surgeon: Angelia Mould, MD;  Location: [facility] OR;  Service: Vascular;  Laterality: Right;   IR FLUORO GUIDE CV LINE RIGHT  07/05/2019   IR US GUIDE VASC ACCESS RIGHT  07/05/2019   LEFT HEART CATH AND CORONARY ANGIOGRAPHY N/A 09/07/2017   Procedure: LEFT HEART CATH AND CORONARY ANGIOGRAPHY;  Surgeon: Leonie Man, MD;  Location: [place] [facility];  Service: Cardiovascular;  Laterality: N/A;   MEMBRANE PEEL Left 01/13/2018   Procedure: MEMBRANE PEEL LEFT EYE ;  Surgeon: Bernarda Caffey, MD;  Location: [place];  Service: Ophthalmology;  Laterality: Left;   MULTIPLE TOOTH EXTRACTIONS     PARS PLANA VITRECTOMY Left 01/13/2018   Procedure: PARS PLANA VITRECTOMY WITH 25 GAUGE LEFT EYE WITH ENDOLASER;  Surgeon: Bernarda Caffey, MD;  Location: [place];  Service: Ophthalmology;  Laterality: Left;   [place]   "partial; fibroids"     reports that she has never smoked. She has never used smokeless tobacco. She reports that she does not drink alcohol and does not use drugs.  Allergies  Allergen Reactions   Ace Inhibitors Cough    Family History  Problem Relation Age of Onset   Diabetes Mother    Hypertension Mother    COPD Mother    Heart failure Mother    Hypertension Sister    Hypertension Brother     Prior to Admission medications   Medication Sig Start Date End Date Taking? Authorizing Provider  oxyCODONE-acetaminophen (PERCOCET) 5-325 MG tablet Take 1 tablet by mouth every 6 (six) hours as needed for severe pain. 04/03/21 04/03/22   Dagoberto Ligas, PA-C  acetaminophen (TYLENOL) 500 MG tablet Take 2 tablets (1,000 mg total) by mouth every 6 (six) hours as needed for mild pain or headache. 07/10/19   Azzie Glatter, FNP  ALPRAZolam Duanne Moron) 0.25 MG tablet Take 1 tablet (0.25 mg total) by mouth 2 (two) times daily as needed for anxiety. Patient not taking: No sig reported 07/13/19   Swayze, Ava, DO  amLODipine (NORVASC) 10 MG tablet TAKE 1 TABLET (10 MG TOTAL) BY MOUTH DAILY. 08/09/19   Azzie Glatter, FNP  aspirin EC 81 MG tablet Take 81 mg by mouth daily.    [provider]  atorvastatin (LIPITOR) 40 MG tablet TAKE 1 TABLET BY MOUTH DAILY AT 6PM Patient taking differently: Take 40 mg by mouth daily. 07/23/20 07/23/21  Azzie Glatter, FNP  carvedilol (COREG) 25 MG tablet TAKE 1 TABLET BY MOUTH TWO TIMES DAILY Patient taking differently: Take 25 mg by mouth 2 (two) times daily.  07/23/20 07/23/21  Azzie Glatter, FNP  feeding supplement, ENSURE ENLIVE, (ENSURE ENLIVE) LIQD Take 237 mLs by mouth 2 (two) times daily between meals. Patient not taking: No sig reported 07/14/19   Swayze, Ava, DO  ferric citrate (AURYXIA) 1 GM 210 MG(Fe) tablet Take 210 mg by mouth 3 (three) times daily with meals. 09/01/19   [provider]  ferrous sulfate (FERROUSUL) 325 (65 FE) MG tablet Take 1 tablet (325 mg total) by mouth every other day. Patient taking differently: Take 325 mg by mouth daily. 06/27/19   Azzie Glatter, FNP  glipiZIDE (GLUCOTROL) 10 MG tablet TAKE 1 TABLET BY MOUTH TWO TIMES DAILY BEFORE MEALS Patient taking differently: Take 10 mg by mouth 2 (two) times daily before a meal. 07/23/20 07/23/21  Azzie Glatter, FNP  hydrALAZINE (APRESOLINE) 50 MG tablet TAKE 1 TABLET (50 MG TOTAL) BY MOUTH 2 (TWO) TIMES DAILY. PATIENT NEEDS APPOINTMENT FOR ANY FUTURE REFILLS. Patient taking differently: Take 50 mg by mouth in the morning and at bedtime. 10/21/20 10/21/21  Skeet Latch, MD  Insulin Glargine  (LANTUS) 100 UNIT/ML Solostar Pen Inject 4 Units into the skin daily. Patient taking differently: Inject 4 Units into the skin daily as needed. In morning if needed 06/27/19   Azzie Glatter, FNP  Insulin Pen Needle (TRUEPLUS PEN NEEDLES) 31G X 6 MM MISC USE AS DIRECTED 06/27/19   Azzie Glatter, FNP  isosorbide mononitrate (IMDUR) 30 MG 24 hr tablet Take 1 tablet (30 mg total) by mouth daily. 07/14/19   Swayze, Ava, DO  multivitamin (RENA-VIT) TABS tablet Take 1 tablet by mouth at bedtime. 07/13/19   Swayze, Ava, DO  nitroGLYCERIN (NITROSTAT) 0.4 MG SL tablet Place 1 tablet (0.4 mg total) under the tongue every 5 (five) minutes x 3 doses as needed for chest pain. 06/27/19   Azzie Glatter, FNP  ondansetron (ZOFRAN ODT) 4 MG disintegrating tablet Take 1 tablet (4 mg total) by mouth every 8 (eight) hours as needed for nausea or vomiting. Patient not taking: No sig reported 09/07/20   Noemi Chapel, MD  ondansetron (ZOFRAN-ODT) 4 MG disintegrating tablet TAKE 1 TABLET (4 MG TOTAL) BY MOUTH EVERY 8 (EIGHT) HOURS AS NEEDED FOR NAUSEA OR VOMITING. Patient not taking: No sig reported 09/07/20 09/07/21  Domenic Moras, PA-C  warfarin (COUMADIN) 5 MG tablet TAKE 1 TABLET BY MOUTH 4 TIMES A WEEK ON TUESDAY, THURSDAY, SATURDAY, AND SUNDAY. (TAKE 7.'5MG'$  ON MONDAY, WEDNESDAY, AND FRIDAY.) Patient taking differently: Take 5-7.5 mg by mouth See admin instructions. Mon Wed Fri, 7.5 mg, all other days 5 mg 09/03/20 09/03/21  Azzie Glatter, FNP    Physical Exam: Vitals:   03/14/2021 1115 03/14/2021 1130 03/23/2021 1145 03/18/2021 1200  BP: (!) 142/82 (!) 147/85 (!) 155/91 (!) 152/70  Pulse: 89 88 91 83  Resp: 16 (!) '25 19 18  '$ Temp:      TempSrc:      SpO2: 99% 99% 99% 100%    Physical Exam Vitals and nursing note reviewed.  Constitutional:      General: She is not in acute distress.    Appearance: She is ill-appearing. She is not toxic-appearing.  HENT:     Head: Normocephalic and atraumatic.     Nose: Nose  normal. No rhinorrhea.  Cardiovascular:     Rate and Rhythm: Normal rate and regular rhythm.  Pulmonary:     Effort: Pulmonary effort is normal. No respiratory distress.     Breath sounds: No wheezing  or rales.  Abdominal:     General: Abdomen is flat.     Tenderness: There is abdominal tenderness in the left lower quadrant.  Musculoskeletal:     Right lower leg: No edema.     Left lower leg: No edema.  Skin:    Comments: Lower extremities cool. Palpable DP pulses bilaterally  +thrill in left UE. +audible bruit in left Left UE  +left ant chest wall permcath in place  Neurological:     Mental Status: She is oriented to person, place, and time.     Labs on Admission: I have personally reviewed following labs and imaging studies  CBC: Recent Labs  Lab 04/03/21 0641 04/03/2021 0806  WBC  --  8.3  HGB 14.3 11.2*  HCT 42.0 36.9  MCV  --  89.3  PLT  --  [id number]   Basic Metabolic Panel: Recent Labs  Lab 04/03/21 0641 03/23/2021 0806  NA 138 137  K 3.6 6.0*  CL 98 98  CO2  --  17*  GLUCOSE 155* 73  BUN 28* 57*  CREATININE 4.50* 8.12*  CALCIUM  --  9.7   GFR: Estimated Creatinine Clearance: 7.8 mL/min (A) (by C-G formula based on SCr of 8.12 mg/dL (H)). Liver Function Tests: No results for input(s): AST, ALT, ALKPHOS, BILITOT, PROT, ALBUMIN in the last 168 hours. Recent Labs  Lab 04/05/2021 0806  LIPASE 26   No results for input(s): AMMONIA in the last 168 hours. Coagulation Profile: Recent Labs  Lab 03/25/2021 0930  INR 1.2   Cardiac Enzymes: No results for input(s): CKTOTAL, CKMB, CKMBINDEX, TROPONINI in the last 168 hours. BNP (last 3 results) No results for input(s): PROBNP in the last 8760 hours. HbA1C: No results for input(s): HGBA1C in the last 72 hours. CBG: Recent Labs  Lab 04/03/21 0557 04/03/21 0952  GLUCAP 159* 158*   Lipid Profile: No results for input(s): CHOL, HDL, LDLCALC, TRIG, CHOLHDL, LDLDIRECT in the last 72 hours. Thyroid Function  Tests: No results for input(s): TSH, T4TOTAL, FREET4, T3FREE, THYROIDAB in the last 72 hours. Anemia Panel: No results for input(s): VITAMINB12, FOLATE, FERRITIN, TIBC, IRON, RETICCTPCT in the last 72 hours. Urine analysis:    Component Value Date/Time   COLORURINE YELLOW 07/21/2018 1303   APPEARANCEUR HAZY (A) 07/21/2018 1303   LABSPEC 1.013 07/21/2018 1303   PHURINE 6.0 07/21/2018 1303   GLUCOSEU 150 (A) 07/21/2018 1303   HGBUR SMALL (A) 07/21/2018 1303   BILIRUBINUR Negative 05/16/2019 1114   KETONESUR negative 01/23/2019 [place] 07/21/2018 1303   PROTEINUR Positive (A) 05/16/2019 1114   PROTEINUR >=300 (A) 07/21/2018 1303   UROBILINOGEN 0.2 05/16/2019 1114   UROBILINOGEN 0.2 10/25/2017 0910   NITRITE Negative 05/16/2019 1114   NITRITE NEGATIVE 07/21/2018 1303   LEUKOCYTESUR Negative 05/16/2019 1114    Radiological Exams on Admission: I have personally reviewed images DG Chest 2 View  Result Date: 04/06/2021 CLINICAL DATA:  Weakness, decreased appetite, chest pain EXAM: CHEST - 2 VIEW COMPARISON:  Chest radiograph 09/07/2020 FINDINGS: A left-sided dialysis catheter is in place terminating over the right atrium. The heart is mildly enlarged. The mediastinal contours are within normal limits. Lung volumes are low. There is no focal consolidation or pulmonary edema. There is no pleural effusion or pneumothorax. The bones are unremarkable. A left axillary vascular stent is noted. IMPRESSION: Mild cardiomegaly and low lung volumes. Otherwise, no radiographic evidence of acute cardiopulmonary process. Electronically Signed   By: Court Joy.D.  On: 03/16/2021 08:34    EKG: I have personally reviewed EKG: notable ST depression in II, III, AVF, V4-6    Assessment/Plan Principal Problem:   NSTEMI (non-ST elevated myocardial infarction) ([place]) Active Problems:   CAD S/P PCI mLAD DES (Promus 3.0 x 24 -- 3.25 mm)   ESRD on dialysis ([place])   Hyperkalemia   Essential  hypertension   Type 2 diabetes mellitus without complication, with long-term current use of insulin (HCC)   Cardiomyopathy, ischemic    NSTEMI (non-ST elevated myocardial infarction) ([place]) Admit to cardiac telemetry.  Cardiology has been consulted.  Patient started on a heparin drip.  Patient was given 325 mg of aspirin in the ER.  We will check lipid panel.  Monitor serial troponins.  Order echocardiogram.  Patient may need invasive ischemic evaluation.  Keep the patient n.p.o. for now.  Continue high-dose statin.  Verified patient's full CODE STATUS with the patient and her daughter Ms. Adah Perl.  CAD S/P PCI mLAD DES (Promus 3.0 x 24 -- 3.25 mm) Continue aspirin and statin.  Continue Coreg.  ESRD on dialysis [facility]) Dr. Justin Mend with nephrology is aware of the patient's need for admission.  Patient has missed dialysis today.  Daughter states the patient did have dialysis on Friday.  Essential hypertension Stable for now.  Patient will continue Nitropaste and Coreg.  Type 2 diabetes mellitus without complication, with long-term current use of insulin (HCC) + Scale insulin.  Keep patient NPO.  Cardiomyopathy, ischemic Last known EF 35% in 2020. Check echo.  Hyperkalemia Pt given 1 dose of lokelma. Nephrology has been consulted for HD.  DVT prophylaxis: IV heparin gtts Code Status: Full Code Family Communication: called and discussed pt's admission to hospital with her dtr Amber Young  Disposition Plan: return to home with dtr and pt's husband  Consults called: EDP has consulted cardiology and nephrology  Admission status: Inpatient, Telemetry bed   Kristopher Oppenheim, DO [facility] 04/01/2021, 12:39 PM

## 2021-04-07 NOTE — Progress Notes (Signed)
ANTICOAGULATION CONSULT NOTE - Initial Consult  Pharmacy Consult for heparin Indication: chest pain/ACS  Allergies  Allergen Reactions   Ace Inhibitors Cough    Patient Measurements:   Heparin Dosing Weight: 79 kg   Vital Signs: Temp: 97.9 F (36.6 C) (08/29 0804) Temp Source: Oral (08/29 0804) BP: 153/82 (08/29 0949) Pulse Rate: 84 (08/29 0949)  Labs: Recent Labs    03/22/2021 0806  HGB 11.2*  HCT 36.9  PLT 328  CREATININE 8.12*  TROPONINIHS 1,320*    Estimated Creatinine Clearance: 7.8 mL/min (A) (by C-G formula based on SCr of 8.12 mg/dL (H)).   Medical History: Past Medical History:  Diagnosis Date   Anemia    Atrial fibrillation ([place]) 06/17/2019   Cataract, bilateral    Chronic combined systolic and diastolic CHF (congestive heart failure) ([place])    a. EF 40-45% in 2019 b. EF 25-30% by repeat echo in 01/2019   CKD (chronic kidney disease), stage IV (HCC)    Coronary artery disease 08/2017   DES x 2 RCA   Dialysis patient ([place]) 07/2019   DKA (diabetic ketoacidoses) 06/16/2019   GERD (gastroesophageal reflux disease)    Glaucoma, left eye    Heart murmur    Hyperlipidemia    Hypertension    Proliferative diabetic retinopathy ([place])    left eye with vitreous hemorrhage and tractional retinal detachment   Stroke ([place]) 09/2014   numbness left upper lip, finger tips on left hand; "resolved" (05/18/2016)   Type II diabetes mellitus (HCC)    Wears glasses     Medications:  (Not in a hospital admission)   Assessment: 40 YOF who presents with weakness and decreased appetite. Troponin elevated concerning for NSTEMI. Pharmacy consulted to start IV heparin. Of note, patient is on warfarin at home for h/o Afib.   INR on admission is 1.2. Hgb mildly low. Plt wnl. ESRD on HD   Goal of Therapy:  Heparin level 0.3-0.7 units/ml Monitor platelets by anticoagulation protocol: Yes   Plan:  -Heparin 4000 units IV bolus followed by heparin infusion at 1000  units/hr -F/u 6 hr HL -Monitor daily HL, CBC and s/s of bleeding   Albertina Parr, PharmD., BCPS, BCCCP Clinical Pharmacist Please refer to [facility] for unit-specific pharmacist

## 2021-04-07 NOTE — Progress Notes (Signed)
    Patient here with NSTEMI has expected postoperative hematoma from recent left arm AV graft.  There is a palpable thrill.  Vascular surgery will be available as needed.   Myshawn Chiriboga C. Donzetta Matters, MD

## 2021-04-08 ENCOUNTER — Inpatient Hospital Stay (HOSPITAL_COMMUNITY): Payer: Medicare Other

## 2021-04-08 ENCOUNTER — Encounter (HOSPITAL_COMMUNITY): Payer: Self-pay | Admitting: Internal Medicine

## 2021-04-08 ENCOUNTER — Encounter (HOSPITAL_COMMUNITY): Admission: EM | Disposition: E | Payer: Self-pay | Source: Home / Self Care | Attending: Student

## 2021-04-08 ENCOUNTER — Other Ambulatory Visit (HOSPITAL_COMMUNITY): Payer: Medicare Other

## 2021-04-08 DIAGNOSIS — R531 Weakness: Secondary | ICD-10-CM

## 2021-04-08 DIAGNOSIS — R778 Other specified abnormalities of plasma proteins: Secondary | ICD-10-CM

## 2021-04-08 DIAGNOSIS — Z794 Long term (current) use of insulin: Secondary | ICD-10-CM

## 2021-04-08 DIAGNOSIS — R7989 Other specified abnormal findings of blood chemistry: Secondary | ICD-10-CM

## 2021-04-08 DIAGNOSIS — E119 Type 2 diabetes mellitus without complications: Secondary | ICD-10-CM

## 2021-04-08 DIAGNOSIS — E875 Hyperkalemia: Secondary | ICD-10-CM

## 2021-04-08 DIAGNOSIS — I214 Non-ST elevation (NSTEMI) myocardial infarction: Secondary | ICD-10-CM | POA: Diagnosis not present

## 2021-04-08 HISTORY — PX: LEFT HEART CATH AND CORONARY ANGIOGRAPHY: CATH118249

## 2021-04-08 LAB — CBC WITH DIFFERENTIAL/PLATELET
Abs Immature Granulocytes: 0.07 10*3/uL (ref 0.00–0.07)
Basophils Absolute: 0 10*3/uL (ref 0.0–0.1)
Basophils Relative: 1 %
Eosinophils Absolute: 0 10*3/uL (ref 0.0–0.5)
Eosinophils Relative: 0 %
HCT: 30 % — ABNORMAL LOW (ref 36.0–46.0)
Hemoglobin: 9.6 g/dL — ABNORMAL LOW (ref 12.0–15.0)
Immature Granulocytes: 1 %
Lymphocytes Relative: 16 %
Lymphs Abs: 1.4 10*3/uL (ref 0.7–4.0)
MCH: 27.1 pg (ref 26.0–34.0)
MCHC: 32 g/dL (ref 30.0–36.0)
MCV: 84.7 fL (ref 80.0–100.0)
Monocytes Absolute: 0.7 10*3/uL (ref 0.1–1.0)
Monocytes Relative: 9 %
Neutro Abs: 6.3 10*3/uL (ref 1.7–7.7)
Neutrophils Relative %: 73 %
Platelets: 256 10*3/uL (ref 150–400)
RBC: 3.54 MIL/uL — ABNORMAL LOW (ref 3.87–5.11)
RDW: 16.4 % — ABNORMAL HIGH (ref 11.5–15.5)
WBC: 8.5 10*3/uL (ref 4.0–10.5)
nRBC: 0.5 % — ABNORMAL HIGH (ref 0.0–0.2)

## 2021-04-08 LAB — HEMOGLOBIN A1C
Hgb A1c MFr Bld: 7 % — ABNORMAL HIGH (ref 4.8–5.6)
Mean Plasma Glucose: 154.2 mg/dL

## 2021-04-08 LAB — GLUCOSE, CAPILLARY
Glucose-Capillary: 75 mg/dL (ref 70–99)
Glucose-Capillary: 103 mg/dL — ABNORMAL HIGH (ref 70–99)

## 2021-04-08 LAB — BASIC METABOLIC PANEL
Anion gap: 22 — ABNORMAL HIGH (ref 5–15)
BUN: 57 mg/dL — ABNORMAL HIGH (ref 8–23)
CO2: 17 mmol/L — ABNORMAL LOW (ref 22–32)
Calcium: 9.7 mg/dL (ref 8.9–10.3)
Chloride: 98 mmol/L (ref 98–111)
Creatinine, Ser: 8.12 mg/dL — ABNORMAL HIGH (ref 0.44–1.00)
GFR, Estimated: 5 mL/min — ABNORMAL LOW (ref >60)
Glucose, Bld: 73 mg/dL (ref 70–99)
Potassium: 6 mmol/L — ABNORMAL HIGH (ref 3.5–5.1)
Sodium: 137 mmol/L (ref 135–145)

## 2021-04-08 LAB — PROCALCITONIN
Procalcitonin: 4.3 ng/mL
Procalcitonin: 4.29 ng/mL

## 2021-04-08 LAB — COMPREHENSIVE METABOLIC PANEL
ALT: 212 U/L — ABNORMAL HIGH (ref 0–44)
AST: 1077 U/L — ABNORMAL HIGH (ref 15–41)
Albumin: 3 g/dL — ABNORMAL LOW (ref 3.5–5.0)
Alkaline Phosphatase: 73 U/L (ref 38–126)
Anion gap: 22 — ABNORMAL HIGH (ref 5–15)
BUN: 78 mg/dL — ABNORMAL HIGH (ref 8–23)
CO2: 18 mmol/L — ABNORMAL LOW (ref 22–32)
Calcium: 9.1 mg/dL (ref 8.9–10.3)
Chloride: 96 mmol/L — ABNORMAL LOW (ref 98–111)
Creatinine, Ser: 9.62 mg/dL — ABNORMAL HIGH (ref 0.44–1.00)
GFR, Estimated: 4 mL/min — ABNORMAL LOW (ref >60)
Glucose, Bld: 85 mg/dL (ref 70–99)
Potassium: 5.7 mmol/L — ABNORMAL HIGH (ref 3.5–5.1)
Sodium: 136 mmol/L (ref 135–145)
Total Bilirubin: 1.3 mg/dL — ABNORMAL HIGH (ref 0.3–1.2)
Total Protein: 6.2 g/dL — ABNORMAL LOW (ref 6.5–8.1)

## 2021-04-08 LAB — HEPATITIS PANEL, ACUTE
HCV Ab: NONREACTIVE
Hep A IgM: NONREACTIVE
Hep B C IgM: NONREACTIVE
Hepatitis B Surface Ag: NONREACTIVE

## 2021-04-08 LAB — AMMONIA: Ammonia: 68 umol/L — ABNORMAL HIGH (ref 9–35)

## 2021-04-08 LAB — HEPARIN LEVEL (UNFRACTIONATED): Heparin Unfractionated: 0.52 IU/mL (ref 0.30–0.70)

## 2021-04-08 LAB — TSH
TSH: 0.576 u[IU]/mL (ref 0.350–4.500)
TSH: 0.656 u[IU]/mL (ref 0.350–4.500)

## 2021-04-08 LAB — VITAMIN B12: Vitamin B-12: 3934 pg/mL — ABNORMAL HIGH (ref 180–914)

## 2021-04-08 LAB — LIPID PANEL
Cholesterol: 165 mg/dL (ref 0–200)
HDL: 63 mg/dL (ref >40)
LDL Cholesterol: 90 mg/dL (ref 0–99)
Total CHOL/HDL Ratio: 2.6 RATIO
Triglycerides: 62 mg/dL (ref <150)
VLDL: 12 mg/dL (ref 0–40)

## 2021-04-08 LAB — HIV ANTIBODY (ROUTINE TESTING W REFLEX): HIV Screen 4th Generation wRfx: NONREACTIVE

## 2021-04-08 LAB — PROTIME-INR
INR: 1.9 — ABNORMAL HIGH (ref 0.8–1.2)
Prothrombin Time: 21.9 seconds — ABNORMAL HIGH (ref 11.4–15.2)

## 2021-04-08 LAB — TROPONIN I (HIGH SENSITIVITY): Troponin I (High Sensitivity): 14294 ng/L (ref <18)

## 2021-04-08 SURGERY — LEFT HEART CATH AND CORONARY ANGIOGRAPHY
Anesthesia: LOCAL

## 2021-04-08 MED ORDER — SODIUM CHLORIDE 0.9 % IV BOLUS
500.0000 mL | Freq: Once | INTRAVENOUS | Status: AC
  Administered 2021-04-08: 500 mL via INTRAVENOUS

## 2021-04-08 MED ORDER — AMIODARONE HCL IN DEXTROSE 360-4.14 MG/200ML-% IV SOLN
30.0000 mg/h | INTRAVENOUS | Status: DC
  Administered 2021-04-09 (×2): 30 mg/h via INTRAVENOUS
  Filled 2021-04-08: qty 200

## 2021-04-08 MED ORDER — AMIODARONE HCL IN DEXTROSE 360-4.14 MG/200ML-% IV SOLN
60.0000 mg/h | INTRAVENOUS | Status: DC
  Administered 2021-04-08: 60 mg/h via INTRAVENOUS
  Filled 2021-04-08: qty 200

## 2021-04-08 MED ORDER — LIDOCAINE HCL (PF) 1 % IJ SOLN
INTRAMUSCULAR | Status: AC
  Filled 2021-04-08: qty 30

## 2021-04-08 MED ORDER — HEPARIN SODIUM (PORCINE) 1000 UNIT/ML IJ SOLN
INTRAMUSCULAR | Status: AC
  Administered 2021-04-08: 4200 [IU]
  Filled 2021-04-08: qty 5

## 2021-04-08 MED ORDER — CHLORHEXIDINE GLUCONATE CLOTH 2 % EX PADS
6.0000 | MEDICATED_PAD | Freq: Every day | CUTANEOUS | Status: DC
  Administered 2021-04-08 – 2021-04-14 (×6): 6 via TOPICAL

## 2021-04-08 MED ORDER — HEPARIN SODIUM (PORCINE) 1000 UNIT/ML IJ SOLN: 4200.0000 [IU] | Freq: Once | INTRAMUSCULAR | Status: AC

## 2021-04-08 MED ORDER — VERAPAMIL HCL 2.5 MG/ML IV SOLN
INTRAVENOUS | Status: AC
  Filled 2021-04-08: qty 2

## 2021-04-08 MED ORDER — HEPARIN (PORCINE) IN NACL 1000-0.9 UT/500ML-% IV SOLN
INTRAVENOUS | Status: AC
  Filled 2021-04-08: qty 1000

## 2021-04-08 MED ORDER — HEPARIN SODIUM (PORCINE) 1000 UNIT/ML IJ SOLN: 4200.0000 [IU] | Freq: Once | INTRAMUSCULAR | Status: DC

## 2021-04-08 MED ORDER — HEPARIN SODIUM (PORCINE) 1000 UNIT/ML IJ SOLN
INTRAMUSCULAR | Status: AC
  Filled 2021-04-08: qty 1

## 2021-04-08 SURGICAL SUPPLY — 7 items
KIT HEART LEFT (KITS) ×1 IMPLANT
PACK CARDIAC CATHETERIZATION (CUSTOM PROCEDURE TRAY) ×1 IMPLANT
SHEATH PINNACLE 5F 10CM (SHEATH) IMPLANT
SHEATH PROBE COVER 6X72 (BAG) IMPLANT
TRANSDUCER W/STOPCOCK (MISCELLANEOUS) ×1 IMPLANT
TUBING CIL FLEX 10 FLL-RA (TUBING) ×1 IMPLANT
WIRE EMERALD 3MM-J .035X150CM (WIRE) IMPLANT

## 2021-04-08 NOTE — Progress Notes (Signed)
Attempted to in and out cath patient to obtain UA, no urine, only white milky pus obtained in the catheter. Pt bladder scanned and 24 mL in bladder.

## 2021-04-08 NOTE — Progress Notes (Signed)
Progress Note  Patient Name: Amber Young Date of Encounter: 03/22/2021  [facility] Cardiologist: Skeet Latch, MD   Subjective    Feeling well. No chest pain or dyspnea.   Inpatient Medications    Scheduled Meds:  [START ON 04/09/2021] aspirin EC  81 mg Oral Daily   atorvastatin  80 mg Oral Daily   carvedilol  25 mg Oral BID   hydrALAZINE  25 mg Oral Q8H   insulin aspart  0-5 Units Subcutaneous QHS   insulin aspart  0-6 Units Subcutaneous TID WC   isosorbide mononitrate  30 mg Oral Daily   sodium chloride flush  3 mL Intravenous Q12H   sodium zirconium cyclosilicate  10 g Oral TID   Continuous Infusions:  sodium chloride     sodium chloride 10 mL/hr at 03/29/2021 0607   heparin 1,000 Units/hr (03/18/2021 1107)   PRN Meds: sodium chloride, acetaminophen **OR** acetaminophen, ondansetron **OR** ondansetron (ZOFRAN) IV, oxyCODONE, sodium chloride flush   Vital Signs    Vitals:   03/14/2021 2117 03/24/2021 0146 03/18/2021 0300 03/20/2021 0545  BP: (!) 115/58 (!) 152/64 (!) 162/70 (!) 158/64  Pulse: (!) 55 (!) 55 (!) 53 (!) 58  Resp: 16 16 (!) 21 (!) 22  Temp: 97.9 F (36.6 C) 98.2 F (36.8 C)    TempSrc: Oral Oral    SpO2: 98% 96% 98% 96%   No intake or output data in the 24 hours ending 03/26/2021 0839 Last 3 Weights 04/03/2021 03/20/2021 06/28/2020  Weight (lbs) 183 lb 183 lb 183 lb 3.2 oz  Weight (kg) 83.008 kg 83.008 kg 83.099 kg      Telemetry    SR - Personally Reviewed  ECG    SR, ST depression in inferior lateral leads  - Personally Reviewed  Physical Exam   GEN: No acute distress.   Neck: No JVD Cardiac: RRR, no murmurs, rubs, or gallops.  Respiratory: Clear to auscultation bilaterally. GI: Soft, nontender, non-distended  MS: Trace edema; No deformity. L upper extremity fistula Neuro:  Nonfocal  Psych: Normal affect   Labs    High Sensitivity Troponin:   Recent Labs  Lab 03/27/2021 0806 03/13/2021 0930 03/18/2021 1442 03/29/2021 1830  03/13/2021 0454  TROPONINIHS 1,320* 1,605* 2,745* 5,564* 14,294*      Chemistry Recent Labs  Lab 04/03/21 0641 03/11/2021 0806 03/18/2021 0454  NA 138 137 136  K 3.6 6.0* 5.7*  CL 98 98 96*  CO2  --  17* 18*  GLUCOSE 155* 73 85  BUN 28* 57* 78*  CREATININE 4.50* 8.12* 9.62*  CALCIUM  --  9.7 9.1  PROT  --   --  6.2*  ALBUMIN  --   --  3.0*  AST  --   --  1,077*  ALT  --   --  212*  ALKPHOS  --   --  73  BILITOT  --   --  1.3*  GFRNONAA  --  5* 4*  ANIONGAP  --  22* 22*     Hematology Recent Labs  Lab 04/03/21 0641 03/11/2021 0806 04/06/2021 0454  WBC  --  8.3 8.5  RBC  --  4.13 3.54*  HGB 14.3 11.2* 9.6*  HCT 42.0 36.9 30.0*  MCV  --  89.3 84.7  MCH  --  27.1 27.1  MCHC  --  30.4 32.0  RDW  --  16.7* 16.4*  PLT  --  328 256     Radiology    CT ABDOMEN PELVIS  WO CONTRAST  Result Date: 03/12/2021 CLINICAL DATA:  Left lower quadrant pain. EXAM: CT ABDOMEN AND PELVIS WITHOUT CONTRAST TECHNIQUE: Multidetector CT imaging of the abdomen and pelvis was performed following the standard protocol without IV contrast. COMPARISON:  Abdomen pelvis 09/07/2020, ultrasound renal 06/16/2019. FINDINGS: Lower chest: Bronchial wall thickening and patchy airspace opacity at the left lung again noted. Left base atelectasis. Findings suggestive of anemia. Enlarged left ventricle. Partially visualized central venous catheter terminating within the right atrium tiny hiatal hernia. Hepatobiliary: Similar-appearing nonspecific 0.8 cm hypodensity (3:23). Layering hyperdensity within the gallbladder lumen consistent with tiny gallstones. No gallbladder wall thickening or pericholecystic fluid. No biliary dilatation. Pancreas: No focal lesion. Normal pancreatic contour. No surrounding inflammatory changes. No main pancreatic ductal dilatation. Spleen: Normal in size without focal abnormality. Adrenals/Urinary Tract: No adrenal nodule bilaterally. No nephrolithiasis and no hydronephrosis. There is a  similar-appearing 2.4 x 1.6 x 3.5 cm No ureterolithiasis or hydroureter. The urinary bladder is unremarkable. Stomach/Bowel: Stomach is within normal limits. No evidence of bowel wall thickening or dilatation. Appendix appears normal. Vascular/Lymphatic: No abdominal aorta or iliac aneurysm. Severe atherosclerotic plaque of the aorta and its branches. No abdominal, pelvic, or inguinal lymphadenopathy. Reproductive: Status post hysterectomy. No adnexal masses. Other: No intraperitoneal free fluid. No intraperitoneal free gas. No organized fluid collection. Musculoskeletal: No abdominal wall hernia or abnormality. No suspicious lytic or blastic osseous lesions. No acute displaced fracture. IMPRESSION: 1. Cholelithiasis with no CT evidence of acute cholecystitis or choledocholithiasis. 2. Similar-appearing right parapelvic cyst. 3. No acute abnormality with limited evaluation on this noncontrast study. Electronically Signed   By: Iven Finn M.D.   On: 03/11/2021 19:08   DG Chest 2 View  Result Date: 03/12/2021 CLINICAL DATA:  Weakness, decreased appetite, chest pain EXAM: CHEST - 2 VIEW COMPARISON:  Chest radiograph 09/07/2020 FINDINGS: A left-sided dialysis catheter is in place terminating over the right atrium. The heart is mildly enlarged. The mediastinal contours are within normal limits. Lung volumes are low. There is no focal consolidation or pulmonary edema. There is no pleural effusion or pneumothorax. The bones are unremarkable. A left axillary vascular stent is noted. IMPRESSION: Mild cardiomegaly and low lung volumes. Otherwise, no radiographic evidence of acute cardiopulmonary process. Electronically Signed   By: Valetta Mole M.D.   On: 04/05/2021 08:34   CT HEAD WO CONTRAST (5MM)  Result Date: 03/23/2021 CLINICAL DATA:  Mental status change. EXAM: CT HEAD WITHOUT CONTRAST TECHNIQUE: Contiguous axial images were obtained from the base of the skull through the vertex without intravenous  contrast. COMPARISON:  None. FINDINGS: Brain: No acute intracranial hemorrhage. No focal mass lesion. No CT evidence of acute infarction. No midline shift or mass effect. No hydrocephalus. Basilar cisterns are patent. Small lacunar infarction in the RIGHT basal ganglia (image 14/5. No change from prior. There are periventricular and subcortical white matter hypodensities. Generalized cortical atrophy. Vascular: No hyperdense vessel or unexpected calcification. Skull: Normal. Negative for fracture or focal lesion. Sinuses/Orbits: Paranasal sinuses and mastoid air cells are clear. Orbits are clear. Other: None. IMPRESSION: 1. No acute intracranial findings. 2. Atrophy and chronic white matter microvascular disease Electronically Signed   By: Suzy Bouchard M.D.   On: 04/06/2021 19:32    Cardiac Studies   Pending cath and echo  Patient Profile     65 y.o. female with a hx of CAD s/p LAD PCI in 2017 and overlapping DES to RCA in 2019, paroxysmal atrial fibrillation, chronic combined CHF, hypertension, hyperlipidemia, prior stroke and  end-stage renal disease on hemodialysis seen for NSTEMI.   Assessment & Plan    NSTEMI - Hs-troponin > 14000. EKG with interior lateral ST depression. Currently denies chest pain. Can't remember weekend event/symptoms. She is now back to her baseline.  - Plan for cardiac cath lateral today once treated for hyperkalemia (sent message to Dr. Justin Mend). - Continue ASA, Lipitor, Coreg, imdur and hydralazine - Continue heparin   2.  Chronic combined CHF - Chest x-ray without acute cardiopulmonary disease - Last echocardiogram November 2020 with LV function of 35 to [id number], grade 2 diastolic dysfunction.   -Volume Managed by dialysis -Continue carvedilol, hydralazine and Imdur - Not on ACE/ARB or Enteresto due to ESRD   3. ESRD on HD (MWF) - Last dialysis on Friday. She need cath and dialysis today. Also needs to correct hyperkalemia. Sent secure message to Dr. Justin Mend and Dr.  Audie Box to coordinate.    4. PAF -Maintaining sinus rhythm -On warfarin for anticoagulation>> hold>> on heparin - INR 1.2   5. AMS - ? Etiology - Lactic acid 3.1>>2.9 - Procalcitonin 4.3 - CT of head and abdomen without acute findings - Seems patient back to her baseline  6. Elevated LFTS - ? Due to infract - CT of abdomen pelvis without acute findings    For questions or updates, please contact [place] Please consult [email or web address] for contact info under        SignedLeanor Kail, PA  03/12/2021, 8:39 AM

## 2021-04-08 NOTE — ED Notes (Signed)
Remains in dialysis

## 2021-04-08 NOTE — Anesthesia Postprocedure Evaluation (Signed)
Anesthesia Post Note  Patient: Amber Young  Procedure(s) Performed: INSERTION OF LEFT UPPER ARM ARTERIOVENOUS (AV) GORE-TEX GRAFT (Left: Arm Upper)     Patient location during evaluation: PACU Anesthesia Type: General Level of consciousness: awake and alert Pain management: pain level controlled Vital Signs Assessment: post-procedure vital signs reviewed and stable Respiratory status: spontaneous breathing, nonlabored ventilation, respiratory function stable and patient connected to nasal cannula oxygen Cardiovascular status: blood pressure returned to baseline and stable Postop Assessment: no apparent nausea or vomiting Anesthetic complications: no   No notable events documented.  Last Vitals:  Vitals:   04/03/21 1021 04/03/21 1036  BP: (!) 168/88 (!) 165/85  Pulse: 72 72  Resp: 13 19  Temp:  36.8 C  SpO2: 97% 93%    Last Pain:  Vitals:   04/03/21 1036  TempSrc:   PainSc: 0-No pain                 Candon Caras

## 2021-04-08 NOTE — Interval H&P Note (Signed)
Cath Lab Visit (complete for each Cath Lab visit)  Clinical Evaluation Leading to the Procedure:   ACS: Yes.    Non-ACS:    Anginal Classification: No Symptoms  Anti-ischemic medical therapy: Minimal Therapy (1 class of medications)  Non-Invasive Test Results: No non-invasive testing performed  Prior CABG: No previous CABG      History and Physical Interval Note:  04/09/2021 1:17 PM  Amber Young  has presented today for surgery, with the diagnosis of nstemi.  The various methods of treatment have been discussed with the patient and family. After consideration of risks, benefits and other options for treatment, the patient has consented to  Procedure(s): LEFT HEART CATH AND CORONARY ANGIOGRAPHY (N/A) as a surgical intervention.  The patient's history has been reviewed, patient examined, no change in status, stable for surgery.  I have reviewed the patient's chart and labs.  Questions were answered to the patient's satisfaction.     Belva Crome III

## 2021-04-08 NOTE — Progress Notes (Signed)
Pt converted to SR but with her vomiting prefers to continue with amiodarone orders as written.

## 2021-04-08 NOTE — Progress Notes (Signed)
   03/25/2021 1546  Assess: MEWS Score  Temp (!) 97.5 F (36.4 C)  BP 105/79  Pulse Rate (!) 127  ECG Heart Rate (!) 127  Resp 13  Level of Consciousness Alert  SpO2 100 %  O2 Device Room Air  Assess: MEWS Score  MEWS Temp 0  MEWS Systolic 0  MEWS Pulse 2  MEWS RR 1  MEWS LOC 0  MEWS Score 3  MEWS Score Color Yellow  Assess: if the MEWS score is Yellow or Red  Were vital signs taken at a resting state? Yes  Focused Assessment No change from prior assessment  Early Detection of Sepsis Score *See Row Information* Low  MEWS guidelines implemented *See Row Information* Yes  Treat  MEWS Interventions Escalated (See documentation below)  Pain Scale 0-10  Pain Score 0  Patients Stated Pain Goal 0  Take Vital Signs  Increase Vital Sign Frequency  Yellow: Q 2hr X 2 then Q 4hr X 2, if remains yellow, continue Q 4hrs  Escalate  MEWS: Escalate Yellow: discuss with charge nurse/RN and consider discussing with provider and RRT  Notify: Charge Nurse/RN  Name of Charge Nurse/RN Notified Kerrie Buffalo RN  Date Charge Nurse/RN Notified 04/05/2021  Time Charge Nurse/RN Notified 1546  Document  Patient Outcome Other (Comment) (new admission to department)  Progress note created (see row info) Yes

## 2021-04-08 NOTE — H&P (View-Only) (Signed)
Progress Note  Patient Name: Amber Young Date of Encounter: 04/05/2021  [facility] Cardiologist: Skeet Latch, MD   Subjective    Feeling well. No chest pain or dyspnea.   Inpatient Medications    Scheduled Meds:  [START ON 04/09/2021] aspirin EC  81 mg Oral Daily   atorvastatin  80 mg Oral Daily   carvedilol  25 mg Oral BID   hydrALAZINE  25 mg Oral Q8H   insulin aspart  0-5 Units Subcutaneous QHS   insulin aspart  0-6 Units Subcutaneous TID WC   isosorbide mononitrate  30 mg Oral Daily   sodium chloride flush  3 mL Intravenous Q12H   sodium zirconium cyclosilicate  10 g Oral TID   Continuous Infusions:  sodium chloride     sodium chloride 10 mL/hr at 03/21/2021 0607   heparin 1,000 Units/hr (03/13/2021 1107)   PRN Meds: sodium chloride, acetaminophen **OR** acetaminophen, ondansetron **OR** ondansetron (ZOFRAN) IV, oxyCODONE, sodium chloride flush   Vital Signs    Vitals:   03/30/2021 2117 03/14/2021 0146 03/25/2021 0300 04/06/2021 0545  BP: (!) 115/58 (!) 152/64 (!) 162/70 (!) 158/64  Pulse: (!) 55 (!) 55 (!) 53 (!) 58  Resp: 16 16 (!) 21 (!) 22  Temp: 97.9 F (36.6 C) 98.2 F (36.8 C)    TempSrc: Oral Oral    SpO2: 98% 96% 98% 96%   No intake or output data in the 24 hours ending 03/28/2021 0839 Last 3 Weights 04/03/2021 03/20/2021 06/28/2020  Weight (lbs) 183 lb 183 lb 183 lb 3.2 oz  Weight (kg) 83.008 kg 83.008 kg 83.099 kg      Telemetry    SR - Personally Reviewed  ECG    SR, ST depression in inferior lateral leads  - Personally Reviewed  Physical Exam   GEN: No acute distress.   Neck: No JVD Cardiac: RRR, no murmurs, rubs, or gallops.  Respiratory: Clear to auscultation bilaterally. GI: Soft, nontender, non-distended  MS: Trace edema; No deformity. L upper extremity fistula Neuro:  Nonfocal  Psych: Normal affect   Labs    High Sensitivity Troponin:   Recent Labs  Lab 03/16/2021 0806 03/19/2021 0930 04/01/2021 1442 03/18/2021 1830  03/13/2021 0454  TROPONINIHS 1,320* 1,605* 2,745* 5,564* 14,294*      Chemistry Recent Labs  Lab 04/03/21 0641 03/14/2021 0806 03/15/2021 0454  NA 138 137 136  K 3.6 6.0* 5.7*  CL 98 98 96*  CO2  --  17* 18*  GLUCOSE 155* 73 85  BUN 28* 57* 78*  CREATININE 4.50* 8.12* 9.62*  CALCIUM  --  9.7 9.1  PROT  --   --  6.2*  ALBUMIN  --   --  3.0*  AST  --   --  1,077*  ALT  --   --  212*  ALKPHOS  --   --  73  BILITOT  --   --  1.3*  GFRNONAA  --  5* 4*  ANIONGAP  --  22* 22*     Hematology Recent Labs  Lab 04/03/21 0641 03/17/2021 0806 03/13/2021 0454  WBC  --  8.3 8.5  RBC  --  4.13 3.54*  HGB 14.3 11.2* 9.6*  HCT 42.0 36.9 30.0*  MCV  --  89.3 84.7  MCH  --  27.1 27.1  MCHC  --  30.4 32.0  RDW  --  16.7* 16.4*  PLT  --  328 256     Radiology    CT ABDOMEN PELVIS  WO CONTRAST  Result Date: 03/29/2021 CLINICAL DATA:  Left lower quadrant pain. EXAM: CT ABDOMEN AND PELVIS WITHOUT CONTRAST TECHNIQUE: Multidetector CT imaging of the abdomen and pelvis was performed following the standard protocol without IV contrast. COMPARISON:  Abdomen pelvis 09/07/2020, ultrasound renal 06/16/2019. FINDINGS: Lower chest: Bronchial wall thickening and patchy airspace opacity at the left lung again noted. Left base atelectasis. Findings suggestive of anemia. Enlarged left ventricle. Partially visualized central venous catheter terminating within the right atrium tiny hiatal hernia. Hepatobiliary: Similar-appearing nonspecific 0.8 cm hypodensity (3:23). Layering hyperdensity within the gallbladder lumen consistent with tiny gallstones. No gallbladder wall thickening or pericholecystic fluid. No biliary dilatation. Pancreas: No focal lesion. Normal pancreatic contour. No surrounding inflammatory changes. No main pancreatic ductal dilatation. Spleen: Normal in size without focal abnormality. Adrenals/Urinary Tract: No adrenal nodule bilaterally. No nephrolithiasis and no hydronephrosis. There is a  similar-appearing 2.4 x 1.6 x 3.5 cm No ureterolithiasis or hydroureter. The urinary bladder is unremarkable. Stomach/Bowel: Stomach is within normal limits. No evidence of bowel wall thickening or dilatation. Appendix appears normal. Vascular/Lymphatic: No abdominal aorta or iliac aneurysm. Severe atherosclerotic plaque of the aorta and its branches. No abdominal, pelvic, or inguinal lymphadenopathy. Reproductive: Status post hysterectomy. No adnexal masses. Other: No intraperitoneal free fluid. No intraperitoneal free gas. No organized fluid collection. Musculoskeletal: No abdominal wall hernia or abnormality. No suspicious lytic or blastic osseous lesions. No acute displaced fracture. IMPRESSION: 1. Cholelithiasis with no CT evidence of acute cholecystitis or choledocholithiasis. 2. Similar-appearing right parapelvic cyst. 3. No acute abnormality with limited evaluation on this noncontrast study. Electronically Signed   By: Iven Finn M.D.   On: 03/26/2021 19:08   DG Chest 2 View  Result Date: 03/22/2021 CLINICAL DATA:  Weakness, decreased appetite, chest pain EXAM: CHEST - 2 VIEW COMPARISON:  Chest radiograph 09/07/2020 FINDINGS: A left-sided dialysis catheter is in place terminating over the right atrium. The heart is mildly enlarged. The mediastinal contours are within normal limits. Lung volumes are low. There is no focal consolidation or pulmonary edema. There is no pleural effusion or pneumothorax. The bones are unremarkable. A left axillary vascular stent is noted. IMPRESSION: Mild cardiomegaly and low lung volumes. Otherwise, no radiographic evidence of acute cardiopulmonary process. Electronically Signed   By: Valetta Mole M.D.   On: 03/12/2021 08:34   CT HEAD WO CONTRAST (5MM)  Result Date: 04/01/2021 CLINICAL DATA:  Mental status change. EXAM: CT HEAD WITHOUT CONTRAST TECHNIQUE: Contiguous axial images were obtained from the base of the skull through the vertex without intravenous  contrast. COMPARISON:  None. FINDINGS: Brain: No acute intracranial hemorrhage. No focal mass lesion. No CT evidence of acute infarction. No midline shift or mass effect. No hydrocephalus. Basilar cisterns are patent. Small lacunar infarction in the RIGHT basal ganglia (image 14/5. No change from prior. There are periventricular and subcortical white matter hypodensities. Generalized cortical atrophy. Vascular: No hyperdense vessel or unexpected calcification. Skull: Normal. Negative for fracture or focal lesion. Sinuses/Orbits: Paranasal sinuses and mastoid air cells are clear. Orbits are clear. Other: None. IMPRESSION: 1. No acute intracranial findings. 2. Atrophy and chronic white matter microvascular disease Electronically Signed   By: Suzy Bouchard M.D.   On: 04/04/2021 19:32    Cardiac Studies   Pending cath and echo  Patient Profile     65 y.o. female with a hx of CAD s/p LAD PCI in 2017 and overlapping DES to RCA in 2019, paroxysmal atrial fibrillation, chronic combined CHF, hypertension, hyperlipidemia, prior stroke and  end-stage renal disease on hemodialysis seen for NSTEMI.   Assessment & Plan    NSTEMI - Hs-troponin > 14000. EKG with interior lateral ST depression. Currently denies chest pain. Can't remember weekend event/symptoms. She is now back to her baseline.  - Plan for cardiac cath lateral today once treated for hyperkalemia (sent message to Dr. Justin Mend). - Continue ASA, Lipitor, Coreg, imdur and hydralazine - Continue heparin   2.  Chronic combined CHF - Chest x-ray without acute cardiopulmonary disease - Last echocardiogram November 2020 with LV function of 35 to [id number], grade 2 diastolic dysfunction.   -Volume Managed by dialysis -Continue carvedilol, hydralazine and Imdur - Not on ACE/ARB or Enteresto due to ESRD   3. ESRD on HD (MWF) - Last dialysis on Friday. She need cath and dialysis today. Also needs to correct hyperkalemia. Sent secure message to Dr. Justin Mend and Dr.  Audie Box to coordinate.    4. PAF -Maintaining sinus rhythm -On warfarin for anticoagulation>> hold>> on heparin - INR 1.2   5. AMS - ? Etiology - Lactic acid 3.1>>2.9 - Procalcitonin 4.3 - CT of head and abdomen without acute findings - Seems patient back to her baseline  6. Elevated LFTS - ? Due to infract - CT of abdomen pelvis without acute findings    For questions or updates, please contact [place] Please consult [email or web address] for contact info under        SignedLeanor Kail, PA  03/22/2021, 8:39 AM

## 2021-04-08 NOTE — ED Notes (Signed)
Procedural consent at bedside.

## 2021-04-08 NOTE — Progress Notes (Signed)
Called to assess Mrs. Amber Young.  Heart catheterization canceled due to altered mental status.  She was unable to lie flat and follow commands for the procedure.  This was canceled as we did not feel she would tolerate a femoral left heart cath.  She was brought up to 6 E. and noted to be in atrial fibrillation with RVR.  We have started amiodarone drip.  She will remain on heparin for non-STEMI now atrial fibrillation.  She is now complaining of pain everywhere and quite drowsy and lethargic.  Her mental status had cleared up this morning.  It appears she has taken a decline.  She reports pain everywhere.  After discussing this with her husband he reports that she has had waxing and waning in her mental status for the past 4 months.  Apparently this has not been found to have an identifiable etiology.  She does have a very elevated procalcitonin.  Blood cultures are negative.  She does have an acute liver injury without identifiable pathology now.  Hospital medicine has ordered a right upper quadrant ultrasound as well as hepatitis panel.  Thyroid studies are normal.  Really unclear what is caused her mental status.  Her non-STEMI is not the culprit here.  I believe this is secondary.  For now we will have to improve her mental status before she undergo a left heart catheterization.  At this time she is not a candidate for invasive procedures.  We will continue with medical management.  Her husband was updated at the bedside.  [place] T. Audie Box, MD, [place]  [address] [place], [place] [address] [phone]  5:43 PM

## 2021-04-08 NOTE — ED Notes (Signed)
Transporting pt to hemodialysis bay 2.

## 2021-04-08 NOTE — Progress Notes (Signed)
  Amiodarone Drug - Drug Interaction Consult Note  Recommendations: Warfarin and glipizide from PTA on hold - will need to follow BG more closely if glipizide resumed and may need weekly dose reduction of warfarin when resumed.  Amiodarone is metabolized by the cytochrome P450 system and therefore has the potential to cause many drug interactions. Amiodarone has an average plasma half-life of 50 days (range 20 to 100 days).   There is potential for drug interactions to occur several weeks or months after stopping treatment and the onset of drug interactions may be slow after initiating amiodarone.   '[]'$  Statins: Increased risk of myopathy. Simvastatin- restrict dose to '20mg'$  daily. Other statins: counsel patients to report any muscle pain or weakness immediately.  '[x]'$  Anticoagulants: Amiodarone can increase anticoagulant effect. Consider warfarin dose reduction. Patients should be monitored closely and the dose of anticoagulant altered accordingly, remembering that amiodarone levels take several weeks to stabilize.  '[]'$  Antiepileptics: Amiodarone can increase plasma concentration of phenytoin, the dose should be reduced. Note that small changes in phenytoin dose can result in large changes in levels. Monitor patient and counsel on signs of toxicity.  '[x]'$  Beta blockers: increased risk of bradycardia, AV block and myocardial depression. Sotalol - avoid concomitant use.  '[]'$   Calcium channel blockers (diltiazem and verapamil): increased risk of bradycardia, AV block and myocardial depression.  '[]'$   Cyclosporine: Amiodarone increases levels of cyclosporine. Reduced dose of cyclosporine is recommended.  '[]'$  Digoxin dose should be halved when amiodarone is started.  '[]'$  Diuretics: increased risk of cardiotoxicity if hypokalemia occurs.  '[x]'$  Oral hypoglycemic agents (glyburide, glipizide, glimepiride): increased risk of hypoglycemia. Patient's glucose levels should be monitored closely when initiating  amiodarone therapy.   '[]'$  Drugs that prolong the QT interval:  Torsades de pointes risk may be increased with concurrent use - avoid if possible.  Monitor QTc, also keep magnesium/potassium WNL if concurrent therapy can't be avoided.  Antibiotics: e.g. fluoroquinolones, erythromycin.  Antiarrhythmics: e.g. quinidine, procainamide, disopyramide, sotalol.  Antipsychotics: e.g. phenothiazines, haloperidol.   Lithium, tricyclic antidepressants, and methadone.   Thank you for involving pharmacy in this patient's care.  Renold Genta, PharmD, BCPS Clinical Pharmacist Clinical phone for 03/22/2021 until 10p is [phone] 03/31/2021 5:37 PM  **Pharmacist phone directory can be found on [email or web address] listed under [email or web address]**

## 2021-04-08 NOTE — Progress Notes (Signed)
[facility] ROUNDING NOTE   Subjective:   Interval History: This is a 65 year old lady end-stage renal disease secondary to diabetes.  She has a history of atrial fibrillation and congestive heart failure with systolic dysfunction ejection fraction 25 to 30%.  She has a history of coronary artery disease and status post PTCA.  She is now admitted with an NSTEMI.  Her usual dialysis days are Monday Wednesday Friday.  However we will plan dialysis for 03/25/2021.     She has a postop hematoma from recent left AV graft placement.  04/03/2021  Blood pressure 180/83 pulse 73 temperature 98.2 O2 sats 96% room air  Sodium 136 potassium 5.7 chloride 96 CO2 18 BUN 78 creatinine 9.6 glucose 154 AST 1000 ALT 212 troponin 14,294.  Hemoglobin 9.6  Dialysis is planned 04/05/2021 she is off schedule she is usually Monday Wednesday Friday patient.  She was admitted with NSTEMI and decision was made to hold off on dialysis until seen by cardiology.    Objective:  Vital signs in last 24 hours:  Temp:  [97.8 F (36.6 C)-98.2 F (36.8 C)] 98.2 F (36.8 C) (08/30 0146) Pulse Rate:  [53-94] 58 (08/30 0545) Resp:  [13-31] 22 (08/30 0545) BP: (115-162)/(58-91) 158/64 (08/30 0545) SpO2:  [96 %-100 %] 96 % (08/30 0545)  Weight change:  There were no vitals filed for this visit.  Intake/Output: No intake/output data recorded.   Intake/Output this shift:  No intake/output data recorded.  CVS- RRR RS- CTA ABD- BS present soft non-distended EXT- no edema   Basic Metabolic Panel: Recent Labs  Lab 04/03/21 0641 03/23/2021 0806 03/30/2021 0454  NA 138 137 136  K 3.6 6.0* 5.7*  CL 98 98 96*  CO2  --  17* 18*  GLUCOSE 155* 73 85  BUN 28* 57* 78*  CREATININE 4.50* 8.12* 9.62*  CALCIUM  --  9.7 9.1    Liver Function Tests: Recent Labs  Lab 03/20/2021 0454  AST 1,077*  ALT 212*  ALKPHOS 73  BILITOT 1.3*  PROT 6.2*  ALBUMIN 3.0*   Recent Labs  Lab 03/18/2021 0806  LIPASE 26   No  results for input(s): AMMONIA in the last 168 hours.  CBC: Recent Labs  Lab 04/03/21 0641 04/06/2021 0806 03/25/2021 0454  WBC  --  8.3 8.5  NEUTROABS  --   --  6.3  HGB 14.3 11.2* 9.6*  HCT 42.0 36.9 30.0*  MCV  --  89.3 84.7  PLT  --  328 256    Cardiac Enzymes: No results for input(s): CKTOTAL, CKMB, CKMBINDEX, TROPONINI in the last 168 hours.  BNP: Invalid input(s): POCBNP  CBG: Recent Labs  Lab 04/03/21 0557 04/03/21 0952 03/14/2021 1746 03/13/2021 2306  GLUCAP 159* 158* 109* 85    Microbiology: Results for orders placed or performed during the hospital encounter of 03/29/2021  Resp Panel by RT-PCR (Flu A&B, Covid) Nasopharyngeal Swab     Status: None   Collection Time: 03/14/2021 10:04 AM   Specimen: Nasopharyngeal Swab; Nasopharyngeal(NP) swabs in vial transport medium  Result Value Ref Range Status   SARS Coronavirus 2 by RT PCR NEGATIVE NEGATIVE Final    Comment: (NOTE) SARS-CoV-2 target nucleic acids are NOT DETECTED.  The SARS-CoV-2 RNA is generally detectable in upper respiratory specimens during the acute phase of infection. The lowest concentration of SARS-CoV-2 viral copies this assay can detect is 138 copies/mL. A negative result does not preclude SARS-Cov-2 infection and should not be used as the sole basis  for treatment or other patient management decisions. A negative result may occur with  improper specimen collection/handling, submission of specimen other than nasopharyngeal swab, presence of viral mutation(s) within the areas targeted by this assay, and inadequate number of viral copies(<138 copies/mL). A negative result must be combined with clinical observations, patient history, and epidemiological information. The expected result is Negative.  Fact Sheet for Patients:  EntrepreneurPulse.com.au  Fact Sheet for Healthcare Providers:  IncredibleEmployment.be  This test is no t yet approved or cleared by the  [place] FDA and  has been authorized for detection and/or diagnosis of SARS-CoV-2 by FDA under an Emergency Use Authorization (EUA). This EUA will remain  in effect (meaning this test can be used) for the duration of the COVID-19 declaration under Section 564(b)(1) of the Act, 21 U.S.C.section 360bbb-3(b)(1), unless the authorization is terminated  or revoked sooner.       Influenza A by PCR NEGATIVE NEGATIVE Final   Influenza B by PCR NEGATIVE NEGATIVE Final    Comment: (NOTE) The Xpert Xpress SARS-CoV-2/FLU/RSV plus assay is intended as an aid in the diagnosis of influenza from Nasopharyngeal swab specimens and should not be used as a sole basis for treatment. Nasal washings and aspirates are unacceptable for Xpert Xpress SARS-CoV-2/FLU/RSV testing.  Fact Sheet for Patients: EntrepreneurPulse.com.au  Fact Sheet for Healthcare Providers: IncredibleEmployment.be  This test is not yet approved or cleared by the [place] FDA and has been authorized for detection and/or diagnosis of SARS-CoV-2 by FDA under an Emergency Use Authorization (EUA). This EUA will remain in effect (meaning this test can be used) for the duration of the COVID-19 declaration under Section 564(b)(1) of the Act, 21 U.S.C. section 360bbb-3(b)(1), unless the authorization is terminated or revoked.  Performed at [facility], [place] [address], [place], [place] [address]     Coagulation Studies: Recent Labs    04/01/2021 0930  LABPROT 15.2  INR 1.2    Urinalysis: No results for input(s): COLORURINE, LABSPEC, PHURINE, GLUCOSEU, HGBUR, BILIRUBINUR, KETONESUR, PROTEINUR, UROBILINOGEN, NITRITE, LEUKOCYTESUR in the last 72 hours.  Invalid input(s): APPERANCEUR    Imaging: CT ABDOMEN PELVIS WO CONTRAST  Result Date: 03/18/2021 CLINICAL DATA:  Left lower quadrant pain. EXAM: CT ABDOMEN AND PELVIS WITHOUT CONTRAST TECHNIQUE: Multidetector CT imaging of the  abdomen and pelvis was performed following the standard protocol without IV contrast. COMPARISON:  Abdomen pelvis 09/07/2020, ultrasound renal 06/16/2019. FINDINGS: Lower chest: Bronchial wall thickening and patchy airspace opacity at the left lung again noted. Left base atelectasis. Findings suggestive of anemia. Enlarged left ventricle. Partially visualized central venous catheter terminating within the right atrium tiny hiatal hernia. Hepatobiliary: Similar-appearing nonspecific 0.8 cm hypodensity (3:23). Layering hyperdensity within the gallbladder lumen consistent with tiny gallstones. No gallbladder wall thickening or pericholecystic fluid. No biliary dilatation. Pancreas: No focal lesion. Normal pancreatic contour. No surrounding inflammatory changes. No main pancreatic ductal dilatation. Spleen: Normal in size without focal abnormality. Adrenals/Urinary Tract: No adrenal nodule bilaterally. No nephrolithiasis and no hydronephrosis. There is a similar-appearing 2.4 x 1.6 x 3.5 cm No ureterolithiasis or hydroureter. The urinary bladder is unremarkable. Stomach/Bowel: Stomach is within normal limits. No evidence of bowel wall thickening or dilatation. Appendix appears normal. Vascular/Lymphatic: No abdominal aorta or iliac aneurysm. Severe atherosclerotic plaque of the aorta and its branches. No abdominal, pelvic, or inguinal lymphadenopathy. Reproductive: Status post hysterectomy. No adnexal masses. Other: No intraperitoneal free fluid. No intraperitoneal free gas. No organized fluid collection. Musculoskeletal: No abdominal wall hernia or abnormality. No  suspicious lytic or blastic osseous lesions. No acute displaced fracture. IMPRESSION: 1. Cholelithiasis with no CT evidence of acute cholecystitis or choledocholithiasis. 2. Similar-appearing right parapelvic cyst. 3. No acute abnormality with limited evaluation on this noncontrast study. Electronically Signed   By: Iven Finn M.D.   On: 03/12/2021 19:08    DG Chest 2 View  Result Date: 04/03/2021 CLINICAL DATA:  Weakness, decreased appetite, chest pain EXAM: CHEST - 2 VIEW COMPARISON:  Chest radiograph 09/07/2020 FINDINGS: A left-sided dialysis catheter is in place terminating over the right atrium. The heart is mildly enlarged. The mediastinal contours are within normal limits. Lung volumes are low. There is no focal consolidation or pulmonary edema. There is no pleural effusion or pneumothorax. The bones are unremarkable. A left axillary vascular stent is noted. IMPRESSION: Mild cardiomegaly and low lung volumes. Otherwise, no radiographic evidence of acute cardiopulmonary process. Electronically Signed   By: Valetta Mole M.D.   On: 03/28/2021 08:34   CT HEAD WO CONTRAST (5MM)  Result Date: 03/20/2021 CLINICAL DATA:  Mental status change. EXAM: CT HEAD WITHOUT CONTRAST TECHNIQUE: Contiguous axial images were obtained from the base of the skull through the vertex without intravenous contrast. COMPARISON:  None. FINDINGS: Brain: No acute intracranial hemorrhage. No focal mass lesion. No CT evidence of acute infarction. No midline shift or mass effect. No hydrocephalus. Basilar cisterns are patent. Small lacunar infarction in the RIGHT basal ganglia (image 14/5. No change from prior. There are periventricular and subcortical white matter hypodensities. Generalized cortical atrophy. Vascular: No hyperdense vessel or unexpected calcification. Skull: Normal. Negative for fracture or focal lesion. Sinuses/Orbits: Paranasal sinuses and mastoid air cells are clear. Orbits are clear. Other: None. IMPRESSION: 1. No acute intracranial findings. 2. Atrophy and chronic white matter microvascular disease Electronically Signed   By: Suzy Bouchard M.D.   On: 03/26/2021 19:32     Medications:    sodium chloride     sodium chloride 10 mL/hr at 03/27/2021 [id number]   heparin 1,000 Units/hr (03/20/2021 1107)    [START ON 04/09/2021] aspirin EC  81 mg Oral Daily    atorvastatin  80 mg Oral Daily   carvedilol  25 mg Oral BID   hydrALAZINE  25 mg Oral Q8H   insulin aspart  0-5 Units Subcutaneous QHS   insulin aspart  0-6 Units Subcutaneous TID WC   isosorbide mononitrate  30 mg Oral Daily   sodium chloride flush  3 mL Intravenous Q12H   sodium zirconium cyclosilicate  10 g Oral TID   sodium chloride, acetaminophen **OR** acetaminophen, ondansetron **OR** ondansetron (ZOFRAN) IV, oxyCODONE, sodium chloride flush  Assessment/ Plan:  Dialysis Orders: [facility] MWF 4HR 15MIN, 3K, 2.ca , EDW 70.0 kg just lowered half kilogram  Right IJ PC ,LUA AVGG inserted 7/22/220 Mircera 60 MCG on 03/19/21 2000 units heparin heparin Calcitriol 1.75 MCG   Assessment/Plan Non-STEMI= plan per admit/cardiology consulted.    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

## 2021-04-08 NOTE — Progress Notes (Signed)
Cardiac Cath:  The patient was brought to Cath Lab 3 after completing dialysis.  Upon entering the room she is repeating the statement that she is cold, having severe arm pain, and that she wants to set up.  She is oriented.  We spoke with the patient's husband, Marise Kosman.  He has given additional consent for the patient to undergo coronary angiography.  Before moving the patient from the stretcher to the Cath Lab table, she asked [place] not to perform the procedure today.  She wants to sit up.  She is moaning and quite uncomfortable.  We decided against performing the procedure since it would be from femoral approach.  In her current state I do not believe she would be able to tolerate a 2 to 4-hour bedrest after the procedure.  She would be at increased risk of having bleeding.  Spoke with both Mr. Fuge and Dr. Audie Box.  The procedure will be canceled for today and done later in this hospital stay when she is more cooperative and comfortable.

## 2021-04-08 NOTE — Progress Notes (Signed)
ANTICOAGULATION CONSULT NOTE  Pharmacy Consult for heparin Indication: chest pain/ACS  Allergies  Allergen Reactions   Ace Inhibitors Cough    Patient Measurements:   Heparin Dosing Weight: 79 kg   Vital Signs: Temp: 98.2 F (36.8 C) (08/30 0146) Temp Source: Oral (08/30 0146) BP: 158/64 (08/30 0545) Pulse Rate: 58 (08/30 0545)  Labs: Recent Labs    04/06/2021 0806 03/31/2021 0930 03/23/2021 1442 03/28/2021 1830 03/21/2021 2035 04/09/2021 0454  HGB 11.2*  --   --   --   --  9.6*  HCT 36.9  --   --   --   --  30.0*  PLT 328  --   --   --   --  256  LABPROT  --  15.2  --   --   --   --   INR  --  1.2  --   --   --   --   HEPARINUNFRC  --   --   --   --  0.49 0.52  CREATININE 8.12*  --   --   --   --  9.62*  TROPONINIHS 1,320* 1,605* 2,745* 5,564*  --  14,294*     Estimated Creatinine Clearance: 6.5 mL/min (A) (by C-G formula based on SCr of 9.62 mg/dL (H)).   Assessment: 78 YOF who presents with weakness and decreased appetite. Troponin elevated concerning for NSTEMI. Pharmacy consulted to start IV heparin. Of note, patient is on warfarin at home for h/o Afib.   Heparin level this AM remains therapeutic on 1000 units/hr. H/H down, Plt wnl. Patient reports no s/s of bleeding. Planning LHC this AM.   Goal of Therapy:  Heparin level 0.3-0.7 units/ml Monitor platelets by anticoagulation protocol: Yes   Plan:  -Continue heparin infusion at 1000 units/hr -F/u plans for [facility] s/p LHC   Thank you for involving pharmacy in this patient's care.  Albertina Parr, PharmD., BCPS, BCCCP Clinical Pharmacist Please refer to [facility] for unit-specific pharmacist

## 2021-04-08 NOTE — Progress Notes (Signed)
   04/02/2021 1320  Vitals  Temp 98.1 F (36.7 C)  Temp Source Oral  BP 129/63  BP Location Right Arm  BP Method Automatic  Patient Position (if appropriate) Lying  Pulse Rate (!) 115  Pulse Rate Source Monitor  ECG Heart Rate (!) 116  Resp 16  Oxygen Therapy  SpO2 98 %  O2 Device Room Air  Dialysis Weight  Weight  (unable to obtain)  Post-Hemodialysis Assessment  Rinseback Volume (mL) 250 mL  KECN 237 V  Dialyzer Clearance Lightly streaked  Duration of HD Treatment -hour(s) 3.5 hour(s)  Hemodialysis Intake (mL) 500 mL  UF Total -Machine (mL) 1520 mL  Net UF (mL) 1020 mL  Tolerated HD Treatment Yes (see progress noted)  Post-Hemodialysis Comments tx complete-pt stable  Pt noted to be in Afib with heart rate running120s-130s. David-PA and Dr. Louanne Belton notified via secure chat. Per David-PA, keep even and continue to dialyze blood. Pt asymptomatic. Pt c/o pain to surgical site on left arm at end of tx, given prn oxy, reported to primary nurse for followup effectiveness.

## 2021-04-08 NOTE — Progress Notes (Addendum)
PROGRESS NOTE  Dinasia Shroff [id number] DOB: 16-Jul-1956 DOA: 04/06/2021 PCP: Pcp, No   LOS: 1 day   Brief narrative:  65 year old African-American female with history of type 2 diabetes, coronary disease with a prior history of drug-eluting stent, end-stage renal disease on hemodialysis, history of ischemic cardiomyopathy last reported ejection fraction of 35% in November 2020, presented to the ER with weakness, decreased appetite and "feeling bad".  Patient underwent AV graft placement on 04/03/2021.  Patient did not have significantly low appetite and was drinking some fluids.  In the ED patient was noted to be afebrile.  EKG showed ST segment depression in the inferior leads and troponin was elevated at initially at 1320 with up going to trend.  Cardiology and nephrology was consulted and patient was admitted hospital for further evaluation and treatment..  In the ED, patient was given aspirin heparin drip and nitroglycerin paste.  Assessment/Plan:  Principal Problem:   NSTEMI (non-ST elevated myocardial infarction) ([place]) Active Problems:   Cardiomyopathy, ischemic   CAD S/P PCI mLAD DES (Promus 3.0 x 24 -- 3.25 mm)   Essential hypertension   Type 2 diabetes mellitus without complication, with long-term current use of insulin (HCC)   ESRD on dialysis ([facility])   Hyperkalemia  NSTEMI (non-ST elevated myocardial infarction)  Patient presenting with ST segment depression and elevated troponins.  Cardiology has been consulted.  On heparin drip.  Continue statins.  Continue Coreg, Imdur with holding parameters.  Check echocardiogram.  Attempted Cardiac catheterization has been canceled due to patient's confusion.  Mild confusion, mild agitation likely metabolic encephalopathy.  Unclear etiology at this time.  Spoke with the patient's daughter who lives with her at home.  Does not look like any obvious source of infection so far but will get a urinalysis as well.  New medication was use of  Percocet, last dose of Percocet was on 8/28.  Patient however was taking multiple doses of Tylenol at home.  There is no mention of alcohol ingestion or illicit drug abuse in the past.  Patient does have mild memory issues at baseline but otherwise oriented as per the daughter but husband had stated cardiology team that she had some confusion issues for few months.  We will continue to monitor liver function test.  Check INR as well.  We will discontinue oxycodone that the patient was taking at home.  Patient has received hemodialysis after admission.  Cardiac catheterization was canceled due to confusion disorientation and patient's refusal today.  We will check TSH, vitamin B12 levels as well.  Patient might need MRI if she continues to be confused.  Will benefit from neurology evaluation if remains encephalopathic.  Also get a urinalysis since the patient still makes urine.  History of CAD S/P PCI mLAD DES (Promus 3.0 x 24 -- 3.25 mm) Continue aspirin and statin, Coreg.  Elevated LFT.  CT scan with cholelithiasis.  No fever or leukocytosis.  Will get ultrasound of the right upper quadrant, acute hepatitis panel.  Avoid hepatotoxic medications.   ESRD on dialysis  Missed dialysis on Monday, went to dialysis on Friday.  Nephrology has been consulted for hemodialysis.   Essential hypertension Continue Nitropaste and Coreg.  We will change Coreg with holding parameters.   Type 2 diabetes mellitus without complication, with long-term current use of insulin  Continue sliding scale insulin Accu-Cheks, diabetic diet   Cardiomyopathy, ischemic Last known EF 35% in 2020.  Repeat echocardiogram pending at this time.   Hyperkalemia Nephrology has been consulted  for hemodialysis.  Received 1 dose of Lokelma for potassium of 5.7.  Atrial fibrillation with RVR with mild hypotension.  Due to underlying ischemia and low EF in the past, spoke with cardiology about it.  Will however give 500 mL of normal  saline bolus x1 since patient just had hemodialysis and is blood pressure is the lower side of normal.  DVT prophylaxis: SCDs Start: 03/10/2021 1530, heparin drip   Code Status: Full code  Family Communication: I had a prolonged discussion with the patient's daughter on the phone and updated her about the clinical condition of the patient.  I also obtained information about the patient from her.  Patient lives with her daughter at home.  Status is: Inpatient  Remains inpatient appropriate because:IV treatments appropriate due to intensity of illness or inability to take PO and Inpatient level of care appropriate due to severity of illness  Dispo: The patient is from: Home              Anticipated d/c is to: Home              Patient currently is not medically stable to d/c.   Difficult to place patient No   Consultants: Cardiology Nephrology  Procedures: Hemodialysis pending  Anti-infectives:  None  Anti-infectives (From admission, onward)    None      Subjective: Today, patient was seen and examined at bedside.  Patient states that she does not feel well . Denies overt pain in the chest.  Denies any nausea vomiting but feels little sleepy.  Patient was confused and disoriented during cardiac catheterization and cath was discontinued  Objective: Vitals:   03/12/2021 1230 03/29/2021 1300  BP: 123/61 137/83  Pulse: 100 (!) 111  Resp:    Temp:    SpO2:     No intake or output data in the 24 hours ending 03/28/2021 1333 Filed Weights   There is no height or weight on file to calculate BMI.   Physical Exam: GENERAL: Seen prior to cardiac catheterization.  Patient is alert awake on verbal command but appears to be mildly somnolent.  Not in obvious distress. HENT: No scleral pallor or icterus. Pupils equally reactive to light. Oral mucosa is moist.  Left chest wall hemodialysis catheter in place. NECK: is supple, no gross swelling noted. CHEST: Clear to auscultation. No  crackles or wheezes.  Diminished breath sounds bilaterally. CVS: S1 and S2 heard, no murmur. Regular rate and rhythm.  ABDOMEN: Soft, non-tender, bowel sounds are present. EXTREMITIES: Bilateral lower extremity edema. CNS: Cranial nerves are intact. No focal motor deficits.  Mildly somnolent SKIN: warm and dry without rashes.  Data Review: I have personally reviewed the following laboratory data and studies,  CBC: Recent Labs  Lab 04/03/21 0641 03/26/2021 0806 03/10/2021 0454  WBC  --  8.3 8.5  NEUTROABS  --   --  6.3  HGB 14.3 11.2* 9.6*  HCT 42.0 36.9 30.0*  MCV  --  89.3 84.7  PLT  --  328 [id number]   Basic Metabolic Panel: Recent Labs  Lab 04/03/21 0641 03/10/2021 0806 03/24/2021 0454  NA 138 137 136  K 3.6 6.0* 5.7*  CL 98 98 96*  CO2  --  17* 18*  GLUCOSE 155* 73 85  BUN 28* 57* 78*  CREATININE 4.50* 8.12* 9.62*  CALCIUM  --  9.7 9.1   Liver Function Tests: Recent Labs  Lab 03/25/2021 0454  AST 1,077*  ALT 212*  ALKPHOS 73  BILITOT  1.3*  PROT 6.2*  ALBUMIN 3.0*   Recent Labs  Lab 03/17/2021 0806  LIPASE 26   No results for input(s): AMMONIA in the last 168 hours. Cardiac Enzymes: No results for input(s): CKTOTAL, CKMB, CKMBINDEX, TROPONINI in the last 168 hours. BNP (last 3 results) No results for input(s): BNP in the last 8760 hours.  ProBNP (last 3 results) No results for input(s): PROBNP in the last 8760 hours.  CBG: Recent Labs  Lab 04/03/21 0557 04/03/21 0952 03/20/2021 1746 03/26/2021 2306  GLUCAP 159* 158* 109* 85   Recent Results (from the past 240 hour(s))  Culture, blood (routine x 2)     Status: None (Preliminary result)   Collection Time: 03/10/2021  9:22 AM   Specimen: BLOOD RIGHT FOREARM  Result Value Ref Range Status   Specimen Description BLOOD RIGHT FOREARM  Final   Special Requests   Final    BOTTLES DRAWN AEROBIC AND ANAEROBIC Blood Culture results may not be optimal due to an inadequate volume of blood received in culture bottles    Culture   Final    NO GROWTH < 24 HOURS Performed at [facility], [place] [address] [place], [place] [address]    Report Status PENDING  Incomplete  Culture, blood (routine x 2)     Status: None (Preliminary result)   Collection Time: 03/13/2021  9:30 AM   Specimen: BLOOD RIGHT ARM  Result Value Ref Range Status   Specimen Description BLOOD RIGHT ARM  Final   Special Requests   Final    BOTTLES DRAWN AEROBIC AND ANAEROBIC Blood Culture adequate volume   Culture   Final    NO GROWTH < 24 HOURS Performed at [facility], [place] [address] [place], [place] [address]    Report Status PENDING  Incomplete  Resp Panel by RT-PCR (Flu A&B, Covid) Nasopharyngeal Swab     Status: None   Collection Time: 03/20/2021 10:04 AM   Specimen: Nasopharyngeal Swab; Nasopharyngeal(NP) swabs in vial transport medium  Result Value Ref Range Status   SARS Coronavirus 2 by RT PCR NEGATIVE NEGATIVE Final    Comment: (NOTE) SARS-CoV-2 target nucleic acids are NOT DETECTED.  The SARS-CoV-2 RNA is generally detectable in upper respiratory specimens during the acute phase of infection. The lowest concentration of SARS-CoV-2 viral copies this assay can detect is 138 copies/mL. A negative result does not preclude SARS-Cov-2 infection and should not be used as the sole basis for treatment or other patient management decisions. A negative result may occur with  improper specimen collection/handling, submission of specimen other than nasopharyngeal swab, presence of viral mutation(s) within the areas targeted by this assay, and inadequate number of viral copies(<138 copies/mL). A negative result must be combined with clinical observations, patient history, and epidemiological information. The expected result is Negative.  Fact Sheet for Patients:  EntrepreneurPulse.com.au  Fact Sheet for Healthcare Providers:  IncredibleEmployment.be  This test is no t yet approved or  cleared by the [place] FDA and  has been authorized for detection and/or diagnosis of SARS-CoV-2 by FDA under an Emergency Use Authorization (EUA). This EUA will remain  in effect (meaning this test can be used) for the duration of the COVID-19 declaration under Section 564(b)(1) of the Act, 21 U.S.C.section 360bbb-3(b)(1), unless the authorization is terminated  or revoked sooner.       Influenza A by PCR NEGATIVE NEGATIVE Final   Influenza B by PCR NEGATIVE NEGATIVE Final    Comment: (NOTE) The  Xpert Xpress SARS-CoV-2/FLU/RSV plus assay is intended as an aid in the diagnosis of influenza from Nasopharyngeal swab specimens and should not be used as a sole basis for treatment. Nasal washings and aspirates are unacceptable for Xpert Xpress SARS-CoV-2/FLU/RSV testing.  Fact Sheet for Patients: EntrepreneurPulse.com.au  Fact Sheet for Healthcare Providers: IncredibleEmployment.be  This test is not yet approved or cleared by the [place] FDA and has been authorized for detection and/or diagnosis of SARS-CoV-2 by FDA under an Emergency Use Authorization (EUA). This EUA will remain in effect (meaning this test can be used) for the duration of the COVID-19 declaration under Section 564(b)(1) of the Act, 21 U.S.C. section 360bbb-3(b)(1), unless the authorization is terminated or revoked.  Performed at [facility], [place] [address], [place], [place] [address]      Studies: CT ABDOMEN PELVIS WO CONTRAST  Result Date: 03/31/2021 CLINICAL DATA:  Left lower quadrant pain. EXAM: CT ABDOMEN AND PELVIS WITHOUT CONTRAST TECHNIQUE: Multidetector CT imaging of the abdomen and pelvis was performed following the standard protocol without IV contrast. COMPARISON:  Abdomen pelvis 09/07/2020, ultrasound renal 06/16/2019. FINDINGS: Lower chest: Bronchial wall thickening and patchy airspace opacity at the left lung again noted. Left base atelectasis.  Findings suggestive of anemia. Enlarged left ventricle. Partially visualized central venous catheter terminating within the right atrium tiny hiatal hernia. Hepatobiliary: Similar-appearing nonspecific 0.8 cm hypodensity (3:23). Layering hyperdensity within the gallbladder lumen consistent with tiny gallstones. No gallbladder wall thickening or pericholecystic fluid. No biliary dilatation. Pancreas: No focal lesion. Normal pancreatic contour. No surrounding inflammatory changes. No main pancreatic ductal dilatation. Spleen: Normal in size without focal abnormality. Adrenals/Urinary Tract: No adrenal nodule bilaterally. No nephrolithiasis and no hydronephrosis. There is a similar-appearing 2.4 x 1.6 x 3.5 cm No ureterolithiasis or hydroureter. The urinary bladder is unremarkable. Stomach/Bowel: Stomach is within normal limits. No evidence of bowel wall thickening or dilatation. Appendix appears normal. Vascular/Lymphatic: No abdominal aorta or iliac aneurysm. Severe atherosclerotic plaque of the aorta and its branches. No abdominal, pelvic, or inguinal lymphadenopathy. Reproductive: Status post hysterectomy. No adnexal masses. Other: No intraperitoneal free fluid. No intraperitoneal free gas. No organized fluid collection. Musculoskeletal: No abdominal wall hernia or abnormality. No suspicious lytic or blastic osseous lesions. No acute displaced fracture. IMPRESSION: 1. Cholelithiasis with no CT evidence of acute cholecystitis or choledocholithiasis. 2. Similar-appearing right parapelvic cyst. 3. No acute abnormality with limited evaluation on this noncontrast study. Electronically Signed   By: Iven Finn M.D.   On: 03/21/2021 19:08   DG Chest 2 View  Result Date: 03/27/2021 CLINICAL DATA:  Weakness, decreased appetite, chest pain EXAM: CHEST - 2 VIEW COMPARISON:  Chest radiograph 09/07/2020 FINDINGS: A left-sided dialysis catheter is in place terminating over the right atrium. The heart is mildly enlarged.  The mediastinal contours are within normal limits. Lung volumes are low. There is no focal consolidation or pulmonary edema. There is no pleural effusion or pneumothorax. The bones are unremarkable. A left axillary vascular stent is noted. IMPRESSION: Mild cardiomegaly and low lung volumes. Otherwise, no radiographic evidence of acute cardiopulmonary process. Electronically Signed   By: Valetta Mole M.D.   On: 04/06/2021 08:34   CT HEAD WO CONTRAST (5MM)  Result Date: 03/27/2021 CLINICAL DATA:  Mental status change. EXAM: CT HEAD WITHOUT CONTRAST TECHNIQUE: Contiguous axial images were obtained from the base of the skull through the vertex without intravenous contrast. COMPARISON:  None. FINDINGS: Brain: No acute intracranial hemorrhage. No focal mass lesion. No CT evidence of  acute infarction. No midline shift or mass effect. No hydrocephalus. Basilar cisterns are patent. Small lacunar infarction in the RIGHT basal ganglia (image 14/5. No change from prior. There are periventricular and subcortical white matter hypodensities. Generalized cortical atrophy. Vascular: No hyperdense vessel or unexpected calcification. Skull: Normal. Negative for fracture or focal lesion. Sinuses/Orbits: Paranasal sinuses and mastoid air cells are clear. Orbits are clear. Other: None. IMPRESSION: 1. No acute intracranial findings. 2. Atrophy and chronic white matter microvascular disease Electronically Signed   By: Suzy Bouchard M.D.   On: 03/17/2021 19:32      Flora Lipps, MD  [facility] 03/14/2021  If 7PM-7AM, please contact night-coverage

## 2021-04-08 NOTE — ED Notes (Signed)
Pt moved from chair to bed with help of husband. Introduced self to patient.

## 2021-04-09 ENCOUNTER — Inpatient Hospital Stay (HOSPITAL_COMMUNITY): Payer: Medicare Other

## 2021-04-09 ENCOUNTER — Inpatient Hospital Stay (HOSPITAL_COMMUNITY): Payer: Medicare Other | Admitting: Certified Registered Nurse Anesthetist

## 2021-04-09 ENCOUNTER — Encounter (HOSPITAL_COMMUNITY): Payer: Medicare Other

## 2021-04-09 DIAGNOSIS — I214 Non-ST elevation (NSTEMI) myocardial infarction: Secondary | ICD-10-CM

## 2021-04-09 DIAGNOSIS — R569 Unspecified convulsions: Secondary | ICD-10-CM

## 2021-04-09 DIAGNOSIS — I6310 Cerebral infarction due to embolism of unspecified precerebral artery: Secondary | ICD-10-CM

## 2021-04-09 LAB — COMPREHENSIVE METABOLIC PANEL
ALT: 372 U/L — ABNORMAL HIGH (ref 0–44)
AST: 1363 U/L — ABNORMAL HIGH (ref 15–41)
Albumin: 3.2 g/dL — ABNORMAL LOW (ref 3.5–5.0)
Alkaline Phosphatase: 115 U/L (ref 38–126)
Anion gap: 22 — ABNORMAL HIGH (ref 5–15)
BUN: 34 mg/dL — ABNORMAL HIGH (ref 8–23)
CO2: 17 mmol/L — ABNORMAL LOW (ref 22–32)
Calcium: 9 mg/dL (ref 8.9–10.3)
Chloride: 98 mmol/L (ref 98–111)
Creatinine, Ser: 5.43 mg/dL — ABNORMAL HIGH (ref 0.44–1.00)
GFR, Estimated: 8 mL/min — ABNORMAL LOW (ref >60)
Glucose, Bld: 105 mg/dL — ABNORMAL HIGH (ref 70–99)
Potassium: 5.5 mmol/L — ABNORMAL HIGH (ref 3.5–5.1)
Sodium: 137 mmol/L (ref 135–145)
Total Bilirubin: 1.5 mg/dL — ABNORMAL HIGH (ref 0.3–1.2)
Total Protein: 6.7 g/dL (ref 6.5–8.1)

## 2021-04-09 LAB — POCT I-STAT EG7
Acid-base deficit: 9 mmol/L — ABNORMAL HIGH (ref 0.0–2.0)
Bicarbonate: 15 mmol/L — ABNORMAL LOW (ref 20.0–28.0)
Calcium, Ion: 1.02 mmol/L — ABNORMAL LOW (ref 1.15–1.40)
HCT: 31 % — ABNORMAL LOW (ref 36.0–46.0)
Hemoglobin: 10.5 g/dL — ABNORMAL LOW (ref 12.0–15.0)
O2 Saturation: 98 %
Patient temperature: 98.5
Potassium: 6 mmol/L — ABNORMAL HIGH (ref 3.5–5.1)
Sodium: 132 mmol/L — ABNORMAL LOW (ref 135–145)
TCO2: 16 mmol/L — ABNORMAL LOW (ref 22–32)
pCO2, Ven: 26.2 mmHg — ABNORMAL LOW (ref 44.0–60.0)
pH, Ven: 7.366 (ref 7.250–7.430)
pO2, Ven: 97 mmHg — ABNORMAL HIGH (ref 32.0–45.0)

## 2021-04-09 LAB — CBC
HCT: 34.5 % — ABNORMAL LOW (ref 36.0–46.0)
Hemoglobin: 11.2 g/dL — ABNORMAL LOW (ref 12.0–15.0)
MCH: 27.1 pg (ref 26.0–34.0)
MCHC: 32.5 g/dL (ref 30.0–36.0)
MCV: 83.5 fL (ref 80.0–100.0)
Platelets: 297 10*3/uL (ref 150–400)
RBC: 4.13 MIL/uL (ref 3.87–5.11)
RDW: 16.9 % — ABNORMAL HIGH (ref 11.5–15.5)
WBC: 12.2 10*3/uL — ABNORMAL HIGH (ref 4.0–10.5)
nRBC: 4.5 % — ABNORMAL HIGH (ref 0.0–0.2)

## 2021-04-09 LAB — RENAL FUNCTION PANEL
Albumin: 3.2 g/dL — ABNORMAL LOW (ref 3.5–5.0)
Anion gap: 24 — ABNORMAL HIGH (ref 5–15)
BUN: 47 mg/dL — ABNORMAL HIGH (ref 8–23)
CO2: 17 mmol/L — ABNORMAL LOW (ref 22–32)
Calcium: 9.2 mg/dL (ref 8.9–10.3)
Chloride: 95 mmol/L — ABNORMAL LOW (ref 98–111)
Creatinine, Ser: 6.22 mg/dL — ABNORMAL HIGH (ref 0.44–1.00)
GFR, Estimated: 7 mL/min — ABNORMAL LOW (ref >60)
Glucose, Bld: 158 mg/dL — ABNORMAL HIGH (ref 70–99)
Phosphorus: 9.4 mg/dL — ABNORMAL HIGH (ref 2.5–4.6)
Potassium: 6 mmol/L — ABNORMAL HIGH (ref 3.5–5.1)
Sodium: 136 mmol/L (ref 135–145)

## 2021-04-09 LAB — GLUCOSE, CAPILLARY
Glucose-Capillary: 143 mg/dL — ABNORMAL HIGH (ref 70–99)
Glucose-Capillary: 41 mg/dL — CL (ref 70–99)
Glucose-Capillary: 142 mg/dL — ABNORMAL HIGH (ref 70–99)
Glucose-Capillary: 141 mg/dL — ABNORMAL HIGH (ref 70–99)
Glucose-Capillary: 165 mg/dL — ABNORMAL HIGH (ref 70–99)
Glucose-Capillary: 149 mg/dL — ABNORMAL HIGH (ref 70–99)
Glucose-Capillary: 120 mg/dL — ABNORMAL HIGH (ref 70–99)

## 2021-04-09 LAB — PROTIME-INR
INR: 2.5 — ABNORMAL HIGH (ref 0.8–1.2)
Prothrombin Time: 27 seconds — ABNORMAL HIGH (ref 11.4–15.2)

## 2021-04-09 LAB — ECHOCARDIOGRAM COMPLETE
Area-P 1/2: 3.31 cm2
Calc EF: 36.4 %
Height: 67 in
S' Lateral: 4.57 cm
Single Plane A2C EF: 33.2 %
Single Plane A4C EF: 39.4 %
Weight: 2493.84 oz

## 2021-04-09 LAB — C-REACTIVE PROTEIN: CRP: 11.4 mg/dL — ABNORMAL HIGH (ref <1.0)

## 2021-04-09 LAB — HEPARIN LEVEL (UNFRACTIONATED): Heparin Unfractionated: 0.21 IU/mL — ABNORMAL LOW (ref 0.30–0.70)

## 2021-04-09 LAB — PROCALCITONIN: Procalcitonin: 3.85 ng/mL

## 2021-04-09 LAB — SEDIMENTATION RATE: Sed Rate: 18 mm/hr (ref 0–22)

## 2021-04-09 LAB — PATHOLOGIST SMEAR REVIEW

## 2021-04-09 LAB — MRSA NEXT GEN BY PCR, NASAL: MRSA by PCR Next Gen: NOT DETECTED

## 2021-04-09 LAB — MAGNESIUM: Magnesium: 2.3 mg/dL (ref 1.7–2.4)

## 2021-04-09 MED ORDER — ORAL CARE MOUTH RINSE
15.0000 mL | OROMUCOSAL | Status: DC
  Administered 2021-04-10 – 2021-04-15 (×49): 15 mL via OROMUCOSAL

## 2021-04-09 MED ORDER — VITAL 1.5 CAL PO LIQD
1000.0000 mL | ORAL | Status: DC
  Administered 2021-04-09 – 2021-04-14 (×5): 1000 mL

## 2021-04-09 MED ORDER — POLYETHYLENE GLYCOL 3350 17 G PO PACK
17.0000 g | PACK | Freq: Every day | ORAL | Status: DC
  Administered 2021-04-13 – 2021-04-14 (×2): 17 g
  Filled 2021-04-09: qty 1

## 2021-04-09 MED ORDER — HEPARIN (PORCINE) 25000 UT/250ML-% IV SOLN
1250.0000 [IU]/h | INTRAVENOUS | Status: DC
  Administered 2021-04-09: 1150 [IU]/h via INTRAVENOUS
  Filled 2021-04-09: qty 250

## 2021-04-09 MED ORDER — LORAZEPAM 2 MG/ML IJ SOLN
INTRAMUSCULAR | Status: AC
  Administered 2021-04-09: 1 mg
  Filled 2021-04-09: qty 1

## 2021-04-09 MED ORDER — B COMPLEX-C PO TABS: 1.0000 | ORAL_TABLET | Freq: Every day | ORAL | Status: DC

## 2021-04-09 MED ORDER — FENTANYL CITRATE (PF) 100 MCG/2ML IJ SOLN
50.0000 ug | INTRAMUSCULAR | Status: DC | PRN
  Administered 2021-04-12 – 2021-04-14 (×2): 50 ug via INTRAVENOUS
  Filled 2021-04-09 (×2): qty 2

## 2021-04-09 MED ORDER — SODIUM CHLORIDE 0.9 % FOR CRRT: INTRAVENOUS_CENTRAL | Status: DC | PRN

## 2021-04-09 MED ORDER — ALTEPLASE 2 MG IJ SOLR
2.0000 mg | Freq: Once | INTRAMUSCULAR | Status: AC | PRN
  Administered 2021-04-09: 2 mg

## 2021-04-09 MED ORDER — PROSOURCE TF PO LIQD
45.0000 mL | Freq: Three times a day (TID) | ORAL | Status: DC
  Administered 2021-04-09 – 2021-04-14 (×16): 45 mL
  Filled 2021-04-09 (×17): qty 45

## 2021-04-09 MED ORDER — ALTEPLASE 2 MG IJ SOLR
2.0000 mg | Freq: Once | INTRAMUSCULAR | Status: DC | PRN
  Administered 2021-04-09: 2 mg
  Filled 2021-04-09 (×2): qty 2

## 2021-04-09 MED ORDER — PROPOFOL 10 MG/ML IV BOLUS
INTRAVENOUS | Status: DC | PRN
  Administered 2021-04-09: 30 mg via INTRAVENOUS

## 2021-04-09 MED ORDER — PERFLUTREN LIPID MICROSPHERE
1.0000 mL | INTRAVENOUS | Status: AC | PRN
  Administered 2021-04-09: 2 mL via INTRAVENOUS
  Filled 2021-04-09: qty 10

## 2021-04-09 MED ORDER — PRISMASOL BGK 0/2.5 32-2.5 MEQ/L EC SOLN
Status: DC
  Filled 2021-04-09 (×5): qty 5000

## 2021-04-09 MED ORDER — HEPARIN SODIUM (PORCINE) 1000 UNIT/ML DIALYSIS
1000.0000 [IU] | INTRAMUSCULAR | Status: DC | PRN
  Filled 2021-04-09 (×2): qty 6

## 2021-04-09 MED ORDER — MIDAZOLAM HCL 2 MG/2ML IJ SOLN: 2.0000 mg | INTRAMUSCULAR | Status: DC | PRN

## 2021-04-09 MED ORDER — DEXTROSE 50 % IV SOLN
INTRAVENOUS | Status: AC
  Administered 2021-04-09: 50 mL
  Filled 2021-04-09: qty 50

## 2021-04-09 MED ORDER — PRISMASOL BGK 0/2.5 32-2.5 MEQ/L EC SOLN
Status: DC
  Filled 2021-04-09 (×19): qty 5000

## 2021-04-09 MED ORDER — FENTANYL CITRATE (PF) 100 MCG/2ML IJ SOLN
50.0000 ug | INTRAMUSCULAR | Status: DC | PRN
  Administered 2021-04-12: 100 ug via INTRAVENOUS
  Filled 2021-04-09: qty 2

## 2021-04-09 MED ORDER — CHLORHEXIDINE GLUCONATE 0.12% ORAL RINSE (MEDLINE KIT)
15.0000 mL | Freq: Two times a day (BID) | OROMUCOSAL | Status: DC
  Administered 2021-04-10 – 2021-04-14 (×10): 15 mL via OROMUCOSAL

## 2021-04-09 MED ORDER — DOCUSATE SODIUM 50 MG/5ML PO LIQD
100.0000 mg | Freq: Two times a day (BID) | ORAL | Status: DC
Start: 2021-04-09 — End: 2021-04-15
  Administered 2021-04-09 – 2021-04-14 (×5): 100 mg
  Filled 2021-04-09 (×5): qty 10

## 2021-04-09 MED ORDER — SUCCINYLCHOLINE CHLORIDE 200 MG/10ML IV SOSY
PREFILLED_SYRINGE | INTRAVENOUS | Status: DC | PRN
  Administered 2021-04-09: 100 mg via INTRAVENOUS

## 2021-04-09 NOTE — Procedures (Signed)
Cortrak  Person Inserting Tube:  Krisalyn Yankowski, Creola Corn, RD Tube Type:  Cortrak - 43 inches Tube Size:  10 Tube Location:  Left nare Initial Placement:  Stomach Secured by: Bridle Technique Used to Measure Tube Placement:  Marking at nare/corner of mouth Cortrak Secured At:  60 cm Cortrak Tube Team Note:  Consult received to place a Cortrak feeding tube.   X-ray is required, abdominal x-ray has been ordered by the Cortrak team. Please confirm tube placement before using the Cortrak tube.   If the tube becomes dislodged please keep the tube and contact the Cortrak team at [email or web address] (password [email or web address]) for replacement.  If after hours and replacement cannot be delayed, place a NG tube and confirm placement with an abdominal x-ray.    Larkin Ina, MS, RD, LDN (she/her/hers) RD pager number and weekend/on-call pager number located in [place].

## 2021-04-09 NOTE — Procedures (Addendum)
Patient Name: Amber Young  MRN: [id number]  Epilepsy Attending: Lora Havens  Referring Physician/Provider: Dr Su Monks Date: 04/09/2021 Duration: 23.17 mins  Patient history: 65yo F with altered mental status with questionable left arm seizure activity. EEG to evaluate for seizure  Level of alertness: Awake  AEDs during EEG study: None  Technical aspects: This EEG study was done with scalp electrodes positioned according to the 10-20 International system of electrode placement. Electrical activity was acquired at a sampling rate of '500Hz'$  and reviewed with a high frequency filter of '70Hz'$  and a low frequency filter of '1Hz'$ . EEG data were recorded continuously and digitally stored.   Description: EEG showed continuous generalized 3 to 6 Hz theta-delta slowing, at times with triphasic morphology.  Hyperventilation and photic stimulation were not performed.     ABNORMALITY - Continuous slow, generalized  IMPRESSION: This study is suggestive of moderate diffuse encephalopathy, nonspecific etiology. No seizures or epileptiform discharges were seen throughout the recording.  Polina Burmaster [name]

## 2021-04-09 NOTE — Anesthesia Procedure Notes (Signed)
Procedure Name: Intubation Date/Time: 04/09/2021 8:32 AM Performed by: Janace Litten, CRNA Pre-anesthesia Checklist: Patient identified, Emergency Drugs available, Suction available and Patient being monitored Patient Re-evaluated:Patient Re-evaluated prior to induction Oxygen Delivery Method: Circle System Utilized Preoxygenation: Pre-oxygenation with 100% oxygen Induction Type: IV induction, Rapid sequence and Cricoid Pressure applied Laryngoscope Size: Mac and 4 Grade View: Grade II Tube type: Oral Tube size: 7.0 mm Number of attempts: 1 Airway Equipment and Method: Stylet and Oral airway Placement Confirmation: ETT inserted through vocal cords under direct vision, positive ETCO2 and breath sounds checked- equal and bilateral Secured at: 21 cm Tube secured with: Tape Dental Injury: Teeth and Oropharynx as per pre-operative assessment

## 2021-04-09 NOTE — Progress Notes (Signed)
ANTICOAGULATION CONSULT NOTE  Pharmacy Consult for heparin Indication: chest pain/ACS and atrial fibrillation  Allergies  Allergen Reactions   Ace Inhibitors Cough    Patient Measurements: Weight: 69.5 kg (153 lb 3.5 oz) Heparin Dosing Weight: 79 kg   Vital Signs: Temp: 97.6 F (36.4 C) (08/31 0405) Temp Source: Axillary (08/31 0405) BP: 120/59 (08/31 0405) Pulse Rate: 58 (08/31 0405)  Labs: Recent Labs    03/26/2021 0806 04/06/2021 0930 04/04/2021 1442 04/02/2021 1830 04/01/2021 2035 03/19/2021 0454 03/24/2021 1809 04/09/21 0248  HGB 11.2*  --   --   --   --  9.6*  --  11.2*  HCT 36.9  --   --   --   --  30.0*  --  34.5*  PLT 328  --   --   --   --  256  --  297  LABPROT  --  15.2  --   --   --   --  21.9*  --   INR  --  1.2  --   --   --   --  1.9*  --   HEPARINUNFRC  --   --   --   --  0.49 0.52  --  0.21*  CREATININE 8.12*  --   --   --   --  9.62*  --  5.43*  TROPONINIHS 1,320* 1,605* 2,745* 5,564*  --  14,294*  --   --      Estimated Creatinine Clearance: 10 mL/min (A) (by C-G formula based on SCr of 5.43 mg/dL (H)).   Assessment: 65 y.o. female admitted with NSTEMI  and h/o Afib for heparin  Goal of Therapy:  Heparin level 0.3-0.7 units/ml Monitor platelets by anticoagulation protocol: Yes   Plan:  Increase Heparin  1150 units/hr Check heparin level in 8 hours.   Phillis Knack, PharmD, BCPS

## 2021-04-09 NOTE — Progress Notes (Signed)
  Chaplain followed up with Ms Erber's daughter and granddaughter at her bedside. Pt's daughter reports it's her understanding that this is just something "that happens sometimes" and is hopeful that they will get some answers for her mother. She expressed gratitude for the support and states she has no needs at this time.  Chaplain also consulted with pt, RN, Lilia Pro. Pt's spouse is appropriately distressed after witnessing Ms Tieszen's event this morning, but he is not present at the bedside at this team. Chaplain encouraged RN to place another consult if he returns this evening and would benefit from additional support. Chaplain will also request follow up with day chaplain tomorrow.  Please page as further needs arise.  Donald Prose. Elyn Peers, M.Div. [facility] Chaplain Pager [phone] Office [phone]     04/09/21 1435  Clinical Encounter Type  Visited With Patient and family together  Visit Type Follow-up;Psychological support;Spiritual support  Referral From Chaplain  Stress Factors  Family Stress Factors Loss of control;Health changes

## 2021-04-09 NOTE — Consult Note (Addendum)
NAME:  Amber Young, MRN:  [id number], DOB:  10/12/1955, LOS: 2 ADMISSION DATE:  03/23/2021, CONSULTATION DATE: 04/09/2021 REFERRING MD: [facility] hospitalist, CHIEF COMPLAINT: Acute respiratory failure and altered mental status in the setting of benign elevated ST MI with troponins of 14,000 and in a patient with end-stage renal disease.  History of Present Illness:  Amber Young is a 65 year old female her of health issues significant for but not limited to atrial fibrillation combined systolic and diastolic heart failure with last 2D showing EF of 40 to 45% in 2019.  She has also end-stage renal disease and has a tunneled catheter and a new AV graft placed on 825 left forearm.  She had the graft placed on 825 and return to [facility] on 03/28/2021 with general malaise not feeling well left-sided chest pain troponin greater than 14,000.  She was scheduled for cardiac catheterization on 04/03/2021 but patient was unable to complete at a time due to pain and discomfort and refusing the procedure. 04/09/2021 she has been transferred to the intensive care unit full mechanical ventilatory support anticoagulation is on hold neurology will be consulted and continue current level of intensive care treatment.  Pertinent  Medical History   Past Medical History:  Diagnosis Date   Anemia    Atrial fibrillation ([place]) 06/17/2019   Cataract, bilateral    Chronic combined systolic and diastolic CHF (congestive heart failure) ([place])    a. EF 40-45% in 2019 b. EF 25-30% by repeat echo in 01/2019   CKD (chronic kidney disease), stage IV (HCC)    Coronary artery disease 08/2017   DES x 2 RCA   Dialysis patient ([place]) 07/2019   DKA (diabetic ketoacidoses) 06/16/2019   GERD (gastroesophageal reflux disease)    Glaucoma, left eye    Heart murmur    Hyperlipidemia    Hypertension    Proliferative diabetic retinopathy ([place])    left eye with vitreous hemorrhage and tractional retinal detachment   Stroke ([place])  09/2014   numbness left upper lip, finger tips on left hand; "resolved" (05/18/2016)   Type II diabetes mellitus ([place])    Wears glasses      Significant Hospital Events: Including procedures, antibiotic start and stop dates in addition to other pertinent events   04/09/2021 intubated on the floor for anesthesia  Interim History / Subjective:  65 year old female who was transferred to intensive care unit from the floor with decreased level of consciousness, atrial fibrillation/bradycardia and required intubation on the floor.  Objective   Blood pressure 120/62, pulse (!) 46, temperature 98.5 F (36.9 C), temperature source Axillary, resp. rate (!) 21, height '5\' 7"'$  (1.702 m), weight 70.7 kg, SpO2 100 %.    Vent Mode: PRVC FiO2 (%):  [100 %] 100 % Set Rate:  [14 bmp] 14 bmp Vt Set:  [490 mL] 490 mL PEEP:  [5 cmH20] 5 cmH20 Plateau Pressure:  [20 cmH20] 20 cmH20   Intake/Output Summary (Last 24 hours) at 04/09/2021 [id number] Last data filed at 04/09/2021 0751 Gross per 24 hour  Intake 1699.38 ml  Output 1020 ml  Net 679.38 ml   Filed Weights   04/09/21 0405 04/09/21 0903  Weight: 69.5 kg 70.7 kg    Examination: General: Well-nourished well-developed female who is poorly responsive at this time moving only her left arm spontaneously HENT: Tube is in place.  JVD or lymphadenopathy is appreciated Lungs: Decreased breath sounds throughout Cardiovascular: Sounds are regular irregular Abdomen: Soft nontender positive bowel Extremities: Warm and dry without  edema left upper extremity with new AV graft in place positive pulse Neuro: Poorly responsive.  Not following commands.  Reactive to light.  Moves left arm spontaneously.  Does not withdraw consistently from any extremities. GU: Anuretic  Resolved Hospital Problem list     Assessment & Plan:  Ventilator dependent respiratory failure in the setting of altered mental status, bradycardia, metabolic disarray, non-ST elevation MI with  troponins 14,000 questionable seizure activity left arm.  Admit to the intensive care unit Mechanical ventilator settings as needed Chest x-ray to confirm tube placement Arterial blood gas  Altered mental status with questionable left arm seizure activity. CT head is pending EEG been ordered Consider neuro consult  Non-ST elevation MI troponin is noted to be 14,000.  Unable do cardiac cath due to altered mental status and metabolic disarray patient refused procedure ,Atrial fib.  History of congestive heart failure Cardiologist input is greatly appreciated She was unable to complete cardiac cath 04/09/2021 due to altered mental status. Resume heparin once CT of the head has been completed 2d echo Holding amiodarone at this time  Diabetes mellitus CBG (last 3)  Recent Labs    04/09/21 0752 04/09/21 0813 04/09/21 0857  GLUCAP 41* 143* 142*  Note she had a CBG this a.m. of 41 on 04/09/2021 requiring D50 Sliding scale insulin  End-stage renal disease Tunneled HD catheter is in place New left AV graft placed on 04/03/2021 Recent Labs  Lab 03/23/2021 0454 04/09/21 0248 04/09/21 0930  K 5.7* 5.5* 6.0*   May need dialysis today 04/09/2021 Nephrology is following CRRT she has tunneled cath  Elevated liver enzyme Monitor Abdominal CT 03/10/2021 with mild cholelithiasis      Best Practice (right click and "Reselect all SmartList Selections" daily)   Diet/type: NPO DVT prophylaxis: systemic heparin GI prophylaxis: PPI Lines: Central line Foley:  Yes, and it is still needed Code Status:  full code Last date of multidisciplinary goals of care discussion [TBD]  Labs   CBC: Recent Labs  Lab 04/03/21 0641 03/24/2021 0806 03/10/2021 0454 04/09/21 0248  WBC  --  8.3 8.5 12.2*  NEUTROABS  --   --  6.3  --   HGB 14.3 11.2* 9.6* 11.2*  HCT 42.0 36.9 30.0* 34.5*  MCV  --  89.3 84.7 83.5  PLT  --  328 256 [id number]    Basic Metabolic Panel: Recent Labs  Lab 04/03/21 0641  04/01/2021 0806 03/29/2021 0454 04/09/21 0248  NA 138 137 136 137  K 3.6 6.0* 5.7* 5.5*  CL 98 98 96* 98  CO2  --  17* 18* 17*  GLUCOSE 155* 73 85 105*  BUN 28* 57* 78* 34*  CREATININE 4.50* 8.12* 9.62* 5.43*  CALCIUM  --  9.7 9.1 9.0  MG  --   --   --  2.3   GFR: Estimated Creatinine Clearance: 10 mL/min (A) (by C-G formula based on SCr of 5.43 mg/dL (H)). Recent Labs  Lab 04/03/2021 0806 03/25/2021 0930 03/21/2021 1442 03/26/2021 0454 04/09/21 0248  PROCALCITON  --   --   --  4.29  4.30 3.85  WBC 8.3  --   --  8.5 12.2*  LATICACIDVEN  --  3.1* 2.9*  --   --     Liver Function Tests: Recent Labs  Lab 04/02/2021 0454 04/09/21 0248  AST 1,077* 1,363*  ALT 212* 372*  ALKPHOS 73 115  BILITOT 1.3* 1.5*  PROT 6.2* 6.7  ALBUMIN 3.0* 3.2*   Recent Labs  Lab  04/09/2021 0806  LIPASE 26   Recent Labs  Lab 03/13/2021 1552  AMMONIA 68*    ABG    Component Value Date/Time   HCO3 18.2 (L) 06/16/2019 0027   TCO2 31 04/03/2021 0641   ACIDBASEDEF 7.0 (H) 06/16/2019 0027   O2SAT 57.0 06/16/2019 0027     Coagulation Profile: Recent Labs  Lab 03/17/2021 0930 04/03/2021 1809  INR 1.2 1.9*    Cardiac Enzymes: No results for input(s): CKTOTAL, CKMB, CKMBINDEX, TROPONINI in the last 168 hours.  HbA1C: Hemoglobin A1C  Date/Time Value Ref Range Status  06/28/2020 10:13 AM 8.5 (A) 4.0 - 5.6 % Final   HbA1c, POC (prediabetic range)  Date/Time Value Ref Range Status  06/28/2020 10:13 AM 8.5 (A) 5.7 - 6.4 % Final   HbA1c, POC (controlled diabetic range)  Date/Time Value Ref Range Status  06/28/2020 10:13 AM 8.5 (A) 0.0 - 7.0 % Final   HbA1c POC (<> result, manual entry)  Date/Time Value Ref Range Status  06/28/2020 10:13 AM 8.5 4.0 - 5.6 % Final   Hgb A1c MFr Bld  Date/Time Value Ref Range Status  03/29/2021 04:54 AM 7.0 (H) 4.8 - 5.6 % Final    Comment:    (NOTE) Pre diabetes:          5.7%-6.4%  Diabetes:              >6.4%  Glycemic control for   <7.0% adults with  diabetes   06/29/2019 10:11 PM 8.9 (H) 4.8 - 5.6 % Final    Comment:    (NOTE) Pre diabetes:          5.7%-6.4% Diabetes:              >6.4% Glycemic control for   <7.0% adults with diabetes     CBG: Recent Labs  Lab 03/29/2021 1518 03/19/2021 2118 04/09/21 0752 04/09/21 0813 04/09/21 0857  GLUCAP 75 103* 41* 143* 142*    Review of Systems:   NA  Past Medical History:  She,  has a past medical history of Anemia, Atrial fibrillation ([place]) (06/17/2019), Cataract, bilateral, Chronic combined systolic and diastolic CHF (congestive heart failure) ([place]), CKD (chronic kidney disease), stage IV ([place]), Coronary artery disease (08/2017), Dialysis patient ([place]) (07/2019), DKA (diabetic ketoacidoses) (06/16/2019), GERD (gastroesophageal reflux disease), Glaucoma, left eye, Heart murmur, Hyperlipidemia, Hypertension, Proliferative diabetic retinopathy ([place]), Stroke ([place]) (09/2014), Type II diabetes mellitus ([place]), and Wears glasses.   Surgical History:   Past Surgical History:  Procedure Laterality Date   AV FISTULA PLACEMENT Left 07/10/2019   Procedure: ARTERIOVENOUS (AV) FISTULA CREATION;  Surgeon: Angelia Mould, MD;  Location: [place];  Service: Vascular;  Laterality: Left;   AV FISTULA PLACEMENT Left 04/03/2021   Procedure: INSERTION OF LEFT UPPER ARM ARTERIOVENOUS (AV) GORE-TEX GRAFT;  Surgeon: Serafina Mitchell, MD;  Location: [facility];  Service: Vascular;  Laterality: Left;   CARDIAC CATHETERIZATION N/A 05/18/2016   Procedure: Left Heart Cath and Coronary Angiography;  Surgeon: Jettie Booze, MD;  Location: [facility];  Service: Cardiovascular;  Laterality: N/A;   CARDIAC CATHETERIZATION N/A 05/18/2016   Procedure: Coronary Stent Intervention;  Surgeon: Jettie Booze, MD;  Location: [place] [facility];  Service: Cardiovascular;  Laterality: N/A;   CATARACT EXTRACTION Right 2020   COLONOSCOPY W/ BIOPSIES AND POLYPECTOMY     CORONARY ANGIOPLASTY     CORONARY  ANGIOPLASTY WITH STENT PLACEMENT     CORONARY STENT INTERVENTION N/A 09/07/2017   Procedure: CORONARY STENT  INTERVENTION;  Surgeon: Leonie Man, MD;  Location: [place] [facility];  Service: Cardiovascular;  Laterality: N/A;   EYE SURGERY     GAS INSERTION Left 01/13/2018   Procedure: INSERTION OF GAS;  Surgeon: Bernarda Caffey, MD;  Location: [facility];  Service: Ophthalmology;  Laterality: Left;   INSERTION OF DIALYSIS CATHETER Right 07/10/2019   Procedure: INSERTION OF TUNNELED DIALYSIS CATHETER;  Surgeon: Angelia Mould, MD;  Location: [facility] OR;  Service: Vascular;  Laterality: Right;   IR FLUORO GUIDE CV LINE RIGHT  07/05/2019   IR US GUIDE VASC ACCESS RIGHT  07/05/2019   LEFT HEART CATH AND CORONARY ANGIOGRAPHY N/A 09/07/2017   Procedure: LEFT HEART CATH AND CORONARY ANGIOGRAPHY;  Surgeon: Leonie Man, MD;  Location: [place] [facility];  Service: Cardiovascular;  Laterality: N/A;   LEFT HEART CATH AND CORONARY ANGIOGRAPHY N/A 03/13/2021   Procedure: LEFT HEART CATH AND CORONARY ANGIOGRAPHY;  Surgeon: Belva Crome, MD;  Location: [place];  Service: Cardiovascular;  Laterality: N/A;  Canceled   MEMBRANE PEEL Left 01/13/2018   Procedure: MEMBRANE PEEL LEFT EYE ;  Surgeon: Bernarda Caffey, MD;  Location: [facility];  Service: Ophthalmology;  Laterality: Left;   MULTIPLE TOOTH EXTRACTIONS     PARS PLANA VITRECTOMY Left 01/13/2018   Procedure: PARS PLANA VITRECTOMY WITH 25 GAUGE LEFT EYE WITH ENDOLASER;  Surgeon: Bernarda Caffey, MD;  Location: [facility];  Service: Ophthalmology;  Laterality: Left;   [place]   "partial; fibroids"     Social History:   reports that she has never smoked. She has never used smokeless tobacco. She reports that she does not drink alcohol and does not use drugs.   Family History:  Her family history includes COPD in her mother; Diabetes in her mother; Heart failure in her mother; Hypertension in her brother, mother, and  sister.   Allergies Allergies  Allergen Reactions   Ace Inhibitors Cough     Home Medications  Prior to Admission medications   Medication Sig Start Date End Date Taking? Authorizing Provider  oxyCODONE-acetaminophen (PERCOCET) 5-325 MG tablet Take 1 tablet by mouth every 6 (six) hours as needed for severe pain. 04/03/21 04/03/22  Dagoberto Ligas, PA-C  acetaminophen (TYLENOL) 500 MG tablet Take 2 tablets (1,000 mg total) by mouth every 6 (six) hours as needed for mild pain or headache. 07/10/19   Azzie Glatter, FNP  ALPRAZolam Duanne Moron) 0.25 MG tablet Take 1 tablet (0.25 mg total) by mouth 2 (two) times daily as needed for anxiety. Patient not taking: No sig reported 07/13/19   Swayze, Ava, DO  amLODipine (NORVASC) 10 MG tablet TAKE 1 TABLET (10 MG TOTAL) BY MOUTH DAILY. 08/09/19   Azzie Glatter, FNP  aspirin EC 81 MG tablet Take 81 mg by mouth daily.    [provider]  atorvastatin (LIPITOR) 40 MG tablet TAKE 1 TABLET BY MOUTH DAILY AT 6PM Patient taking differently: Take 40 mg by mouth daily. 07/23/20 07/23/21  Azzie Glatter, FNP  carvedilol (COREG) 25 MG tablet TAKE 1 TABLET BY MOUTH TWO TIMES DAILY Patient taking differently: Take 25 mg by mouth 2 (two) times daily. 07/23/20 07/23/21  Azzie Glatter, FNP  feeding supplement, ENSURE ENLIVE, (ENSURE ENLIVE) LIQD Take 237 mLs by mouth 2 (two) times daily between meals. Patient not taking: No sig reported 07/14/19   Swayze, Ava, DO  ferric citrate (AURYXIA) 1 GM 210 MG(Fe) tablet Take  210 mg by mouth 3 (three) times daily with meals. 09/01/19   [provider]  ferrous sulfate (FERROUSUL) 325 (65 FE) MG tablet Take 1 tablet (325 mg total) by mouth every other day. Patient taking differently: Take 325 mg by mouth daily. 06/27/19   Azzie Glatter, FNP  glipiZIDE (GLUCOTROL) 10 MG tablet TAKE 1 TABLET BY MOUTH TWO TIMES DAILY BEFORE MEALS Patient taking differently: Take 10 mg by mouth 2 (two) times daily  before a meal. 07/23/20 07/23/21  Azzie Glatter, FNP  hydrALAZINE (APRESOLINE) 50 MG tablet TAKE 1 TABLET (50 MG TOTAL) BY MOUTH 2 (TWO) TIMES DAILY. PATIENT NEEDS APPOINTMENT FOR ANY FUTURE REFILLS. Patient taking differently: Take 50 mg by mouth in the morning and at bedtime. 10/21/20 10/21/21  Skeet Latch, MD  Insulin Glargine (LANTUS) 100 UNIT/ML Solostar Pen Inject 4 Units into the skin daily. Patient taking differently: Inject 4 Units into the skin daily as needed. In morning if needed 06/27/19   Azzie Glatter, FNP  Insulin Pen Needle (TRUEPLUS PEN NEEDLES) 31G X 6 MM MISC USE AS DIRECTED 06/27/19   Azzie Glatter, FNP  isosorbide mononitrate (IMDUR) 30 MG 24 hr tablet Take 1 tablet (30 mg total) by mouth daily. 07/14/19   Swayze, Ava, DO  multivitamin (RENA-VIT) TABS tablet Take 1 tablet by mouth at bedtime. 07/13/19   Swayze, Ava, DO  nitroGLYCERIN (NITROSTAT) 0.4 MG SL tablet Place 1 tablet (0.4 mg total) under the tongue every 5 (five) minutes x 3 doses as needed for chest pain. 06/27/19   Azzie Glatter, FNP  ondansetron (ZOFRAN ODT) 4 MG disintegrating tablet Take 1 tablet (4 mg total) by mouth every 8 (eight) hours as needed for nausea or vomiting. Patient not taking: No sig reported 09/07/20   Noemi Chapel, MD  ondansetron (ZOFRAN-ODT) 4 MG disintegrating tablet TAKE 1 TABLET (4 MG TOTAL) BY MOUTH EVERY 8 (EIGHT) HOURS AS NEEDED FOR NAUSEA OR VOMITING. Patient not taking: No sig reported 09/07/20 09/07/21  Domenic Moras, PA-C  warfarin (COUMADIN) 5 MG tablet TAKE 1 TABLET BY MOUTH 4 TIMES A WEEK ON TUESDAY, THURSDAY, SATURDAY, AND SUNDAY. (TAKE 7.'5MG'$  ON MONDAY, WEDNESDAY, AND FRIDAY.) Patient taking differently: Take 5-7.5 mg by mouth See admin instructions. Mon Wed Fri, 7.5 mg, all other days 5 mg 09/03/20 09/03/21  Azzie Glatter, FNP     Critical care time: 68     Richardson Landry Zadkiel Dragan ACNP Acute Care Nurse Practitioner [place] Please consult  Amion 04/09/2021, 9:27 AM

## 2021-04-09 NOTE — Progress Notes (Signed)
eLink Physician-Brief Progress Note Patient Name: Amber Young DOB: February 06, 1956 MRN: [id number]   Date of Service  04/09/2021  HPI/Events of Note  Patient with clotted dialysis catheter.  eICU Interventions  CathFlo ordered.        [name] Scotty Pinder 04/09/2021, 9:16 PM

## 2021-04-09 NOTE — Progress Notes (Signed)
LTM EEG setup at bedside. No skin breakdown noted. [facility] called. Marker button pushed.

## 2021-04-09 NOTE — Significant Event (Signed)
Rapid Response Event Note   Reason for Call :  Decreased mental status  Initial Focused Assessment:  Patient is obtunded.  She will flinch with her left face to deep painful stimuli.  Noted rhythmic movement of her left shoulder.   She did reach her left arm up with deep oral suction.  No movement noted on her right side.  Lung sounds decreased bases Heart tones regular. BP 122/66  SB 47  RR 28  O2 sat 94-98% on RA  CBG 41    Dr Broadus John at bedside   Interventions:  1 amp D50 given IV    repeat CBG 143 & 142 1 mg Ativan given IV for focal seizure activity  0811  Code Blue called for poor respiratory effort and unresponsiveness.  No Loss of pulse. Anesthesia intubated patient  Heparin stopped  Stat head CT done  Transported to 3M13 CCM at bedside to assess patient  Plan of Care:    Event Summary:   MD Notified: Dr Broadus John Call Time: 0741 Arrival Time: [id number] End Time: 0900  Raliegh Ip, RN

## 2021-04-09 NOTE — Progress Notes (Signed)
   04/09/21 [id number]  Clinical Encounter Type  Visited With Patient not available  Visit Type Initial;Code  Referral From Nurse  Consult/Referral To Chaplain   Chaplain responded to Code Blue. No support currently needed. Passed onto day chaplain for follow-up.   This note was prepared by Chaplain Resident, Dante Gang, MDiv. Chaplain remains available as needed through the on-call pager: [phone].

## 2021-04-09 NOTE — Progress Notes (Signed)
Amber Young   Subjective:   Interval History: This is a 65 year old lady end-stage renal disease secondary to diabetes.  She has a history of atrial fibrillation and congestive heart failure with systolic dysfunction ejection fraction 25 to 30%.  She has a history of coronary artery disease and status post PTCA.  She is now admitted with an NSTEMI.  Her usual dialysis days are Monday Wednesday Friday.     She has a postop hematoma from recent left AV graft placement.  04/03/2021.  Patient underwent dialysis 03/26/2021.  He did not receive a cardiac catheterization due to pain and discomfort refusing procedure.  She developed acute dyspnea 04/09/2021 and is being transferred to the intensive care unit for mechanical ventilation.  Appreciate assistance from critical care.  Blood pressure 120/80 pulse 55 temperature 98 O2 sats 100% mechanical ventilator 60%  Sodium 132 potassium 6 chloride 98 CO2 17 BUN 34 creatinine 5 calcium 9 hemoglobin 10.5  Will initiate patient on CRRT       Objective:  Vital signs in last 24 hours:  Temp:  [97.5 F (36.4 C)-98.5 F (36.9 C)] 98.5 F (36.9 C) (08/31 0903) Pulse Rate:  [43-127] 46 (08/31 1000) Resp:  [0-28] 18 (08/31 1000) BP: (96-165)/(37-83) 103/67 (08/31 1000) SpO2:  [94 %-100 %] 100 % (08/31 1000) FiO2 (%):  [60 %-100 %] 60 % (08/31 0936) Weight:  [69.5 kg-70.7 kg] 70.7 kg (08/31 0903)  Weight change:  Filed Weights   04/09/21 0405 04/09/21 0903  Weight: 69.5 kg 70.7 kg    Intake/Output: I/O last 3 completed shifts: In: 1685.2 [I.V.:685.2; IV Piggyback:1000] Out: [id number] [Other:1020]   Intake/Output this shift:  Total I/O In: 14.2 [I.V.:14.2] Out: -   Patient intubated CVS- RRR mechanically supported breath sounds RS- CTA ABD- BS present hypoactive bowel sounds EXT- no edema     Basic Metabolic Panel: Recent Labs  Lab 04/03/21 0641 04/04/2021 0806 03/15/2021 0454 04/09/21 0248 04/09/21 0930  NA 138  137 136 137 132*  K 3.6 6.0* 5.7* 5.5* 6.0*  CL 98 98 96* 98  --   CO2  --  17* 18* 17*  --   GLUCOSE 155* 73 85 105*  --   BUN 28* 57* 78* 34*  --   CREATININE 4.50* 8.12* 9.62* 5.43*  --   CALCIUM  --  9.7 9.1 9.0  --   MG  --   --   --  2.3  --      Liver Function Tests: Recent Labs  Lab 03/10/2021 0454 04/09/21 0248  AST 1,077* 1,363*  ALT 212* 372*  ALKPHOS 73 115  BILITOT 1.3* 1.5*  PROT 6.2* 6.7  ALBUMIN 3.0* 3.2*    Recent Labs  Lab 04/01/2021 0806  LIPASE 26    Recent Labs  Lab 03/21/2021 1552  AMMONIA 68*    CBC: Recent Labs  Lab 04/03/21 0641 04/09/2021 0806 03/19/2021 0454 04/09/21 0248 04/09/21 0930  WBC  --  8.3 8.5 12.2*  --   NEUTROABS  --   --  6.3  --   --   HGB 14.3 11.2* 9.6* 11.2* 10.5*  HCT 42.0 36.9 30.0* 34.5* 31.0*  MCV  --  89.3 84.7 83.5  --   PLT  --  328 256 297  --      Cardiac Enzymes: No results for input(s): CKTOTAL, CKMB, CKMBINDEX, TROPONINI in the last 168 hours.  BNP: Invalid input(s): POCBNP  CBG: Recent Labs  Lab 03/20/2021 1518  03/24/2021 2118 04/09/21 0752 04/09/21 0813 04/09/21 0857  GLUCAP 75 103* 41* 143* 142*     Microbiology: Results for orders placed or performed during the hospital encounter of 03/16/2021  Culture, blood (routine x 2)     Status: None (Preliminary result)   Collection Time: 03/29/2021  9:22 AM   Specimen: BLOOD RIGHT FOREARM  Result Value Ref Range Status   Specimen Description BLOOD RIGHT FOREARM  Final   Special Requests   Final    BOTTLES DRAWN AEROBIC AND ANAEROBIC Blood Culture results may not be optimal due to an inadequate volume of blood received in culture bottles   Culture   Final    NO GROWTH < 24 HOURS Performed at [facility], [place] [address] [place], [place] [address]    Report Status PENDING  Incomplete  Culture, blood (routine x 2)     Status: None (Preliminary result)   Collection Time: 03/31/2021  9:30 AM   Specimen: BLOOD RIGHT ARM  Result Value Ref Range  Status   Specimen Description BLOOD RIGHT ARM  Final   Special Requests   Final    BOTTLES DRAWN AEROBIC AND ANAEROBIC Blood Culture adequate volume   Culture   Final    NO GROWTH < 24 HOURS Performed at [facility], [place] [address], [place], [place] [address]    Report Status PENDING  Incomplete  Resp Panel by RT-PCR (Flu A&B, Covid) Nasopharyngeal Swab     Status: None   Collection Time: 04/01/2021 10:04 AM   Specimen: Nasopharyngeal Swab; Nasopharyngeal(NP) swabs in vial transport medium  Result Value Ref Range Status   SARS Coronavirus 2 by RT PCR NEGATIVE NEGATIVE Final    Comment: (Young) SARS-CoV-2 target nucleic acids are NOT DETECTED.  The SARS-CoV-2 RNA is generally detectable in upper respiratory specimens during the acute phase of infection. The lowest concentration of SARS-CoV-2 viral copies this assay can detect is 138 copies/mL. A negative result does not preclude SARS-Cov-2 infection and should not be used as the sole basis for treatment or other patient management decisions. A negative result may occur with  improper specimen collection/handling, submission of specimen other than nasopharyngeal swab, presence of viral mutation(s) within the areas targeted by this assay, and inadequate number of viral copies(<138 copies/mL). A negative result must be combined with clinical observations, patient history, and epidemiological information. The expected result is Negative.  Fact Sheet for Patients:  EntrepreneurPulse.com.au  Fact Sheet for Healthcare Providers:  IncredibleEmployment.be  This test is no t yet approved or cleared by the [place] FDA and  has been authorized for detection and/or diagnosis of SARS-CoV-2 by FDA under an Emergency Use Authorization (EUA). This EUA will remain  in effect (meaning this test can be used) for the duration of the COVID-19 declaration under Section 564(b)(1) of the Act,  21 U.S.C.section 360bbb-3(b)(1), unless the authorization is terminated  or revoked sooner.       Influenza A by PCR NEGATIVE NEGATIVE Final   Influenza B by PCR NEGATIVE NEGATIVE Final    Comment: (Young) The Xpert Xpress SARS-CoV-2/FLU/RSV plus assay is intended as an aid in the diagnosis of influenza from Nasopharyngeal swab specimens and should not be used as a sole basis for treatment. Nasal washings and aspirates are unacceptable for Xpert Xpress SARS-CoV-2/FLU/RSV testing.  Fact Sheet for Patients: EntrepreneurPulse.com.au  Fact Sheet for Healthcare Providers: IncredibleEmployment.be  This test is not yet approved or cleared by the [place] FDA and has been  authorized for detection and/or diagnosis of SARS-CoV-2 by FDA under an Emergency Use Authorization (EUA). This EUA will remain in effect (meaning this test can be used) for the duration of the COVID-19 declaration under Section 564(b)(1) of the Act, 21 U.S.C. section 360bbb-3(b)(1), unless the authorization is terminated or revoked.  Performed at [facility], [place] [address], [place], [place] [address]     Coagulation Studies: Recent Labs    03/17/2021 0930 03/18/2021 1809  LABPROT 15.2 21.9*  INR 1.2 1.9*     Urinalysis: No results for input(s): COLORURINE, LABSPEC, PHURINE, GLUCOSEU, HGBUR, BILIRUBINUR, KETONESUR, PROTEINUR, UROBILINOGEN, NITRITE, LEUKOCYTESUR in the last 72 hours.  Invalid input(s): APPERANCEUR    Imaging: CT ABDOMEN PELVIS WO CONTRAST  Result Date: 03/13/2021 CLINICAL DATA:  Left lower quadrant pain. EXAM: CT ABDOMEN AND PELVIS WITHOUT CONTRAST TECHNIQUE: Multidetector CT imaging of the abdomen and pelvis was performed following the standard protocol without IV contrast. COMPARISON:  Abdomen pelvis 09/07/2020, ultrasound renal 06/16/2019. FINDINGS: Lower chest: Bronchial wall thickening and patchy airspace opacity at the left lung again  noted. Left base atelectasis. Findings suggestive of anemia. Enlarged left ventricle. Partially visualized central venous catheter terminating within the right atrium tiny hiatal hernia. Hepatobiliary: Similar-appearing nonspecific 0.8 cm hypodensity (3:23). Layering hyperdensity within the gallbladder lumen consistent with tiny gallstones. No gallbladder wall thickening or pericholecystic fluid. No biliary dilatation. Pancreas: No focal lesion. Normal pancreatic contour. No surrounding inflammatory changes. No main pancreatic ductal dilatation. Spleen: Normal in size without focal abnormality. Adrenals/Urinary Tract: No adrenal nodule bilaterally. No nephrolithiasis and no hydronephrosis. There is a similar-appearing 2.4 x 1.6 x 3.5 cm No ureterolithiasis or hydroureter. The urinary bladder is unremarkable. Stomach/Bowel: Stomach is within normal limits. No evidence of bowel wall thickening or dilatation. Appendix appears normal. Vascular/Lymphatic: No abdominal aorta or iliac aneurysm. Severe atherosclerotic plaque of the aorta and its branches. No abdominal, pelvic, or inguinal lymphadenopathy. Reproductive: Status post hysterectomy. No adnexal masses. Other: No intraperitoneal free fluid. No intraperitoneal free gas. No organized fluid collection. Musculoskeletal: No abdominal wall hernia or abnormality. No suspicious lytic or blastic osseous lesions. No acute displaced fracture. IMPRESSION: 1. Cholelithiasis with no CT evidence of acute cholecystitis or choledocholithiasis. 2. Similar-appearing right parapelvic cyst. 3. No acute abnormality with limited evaluation on this noncontrast study. Electronically Signed   By: Iven Finn M.D.   On: 03/18/2021 19:08   CT HEAD WO CONTRAST (5MM)  Result Date: 04/09/2021 CLINICAL DATA:  Mental status change, unknown cause. Intubated and unresponsive. EXAM: CT HEAD WITHOUT CONTRAST TECHNIQUE: Contiguous axial images were obtained from the base of the skull through  the vertex without intravenous contrast. COMPARISON:  Prior head CT examinations 03/21/2021 and earlier. Brain MRI 06/17/2019. FINDINGS: Brain: Mild generalized cerebral and cerebellar atrophy. There are small infarcts within the bilateral cerebellar hemispheres which are new as compared to the brain MRI of 06/17/2019, but otherwise age-indeterminate. Redemonstrated chronic lacunar infarcts versus prominent perivascular spaces within the bilateral basal ganglia (series 2, image 16). In retrospect, these infarcts were present on the head CT head 03/17/2021. Redemonstrated chronic lacunar infarct within the right thalamus. Moderate patchy and ill-defined hypoattenuation within the cerebral white matter, nonspecific but compatible with chronic small vessel ischemic disease. There is no acute intracranial hemorrhage. No demarcated cortical infarct. No extra-axial fluid collection. No evidence of an intracranial mass. No midline shift. Vascular: No hyperdense vessel.  Atherosclerotic calcifications. Skull: Normal. Negative for fracture or focal lesion. Sinuses/Orbits: Visualized orbits show no acute finding. No  significant paranasal sinus disease at the imaged levels. IMPRESSION: Small infarcts within the bilateral cerebellar hemispheres are new as compared to the brain MRI of 06/17/2019, but otherwise age-indeterminate. Consider a brain MRI for further evaluation. Redemonstrated chronic lacunar infarcts versus prominent perivascular spaces within bilateral basal ganglia. Redemonstrated chronic lacunar infarct within the right thalamus. Moderate chronic small vessel ischemic changes within cerebral white matter. Mild generalized parenchymal atrophy. Electronically Signed   By: Kellie Simmering D.O.   On: 04/09/2021 08:50   CT HEAD WO CONTRAST (5MM)  Result Date: 03/14/2021 CLINICAL DATA:  Mental status change. EXAM: CT HEAD WITHOUT CONTRAST TECHNIQUE: Contiguous axial images were obtained from the base of the skull  through the vertex without intravenous contrast. COMPARISON:  None. FINDINGS: Brain: No acute intracranial hemorrhage. No focal mass lesion. No CT evidence of acute infarction. No midline shift or mass effect. No hydrocephalus. Basilar cisterns are patent. Small lacunar infarction in the RIGHT basal ganglia (image 14/5. No change from prior. There are periventricular and subcortical white matter hypodensities. Generalized cortical atrophy. Vascular: No hyperdense vessel or unexpected calcification. Skull: Normal. Negative for fracture or focal lesion. Sinuses/Orbits: Paranasal sinuses and mastoid air cells are clear. Orbits are clear. Other: None. IMPRESSION: 1. No acute intracranial findings. 2. Atrophy and chronic white matter microvascular disease Electronically Signed   By: Suzy Bouchard M.D.   On: 04/09/2021 19:32   CARDIAC CATHETERIZATION  Result Date: 03/23/2021 Procedure was canceled at the patient's request   US ABDOMEN LIMITED RUQ (LIVER/GB)  Result Date: 03/13/2021 CLINICAL DATA:  Elevated liver function tests. EXAM: ULTRASOUND ABDOMEN LIMITED RIGHT UPPER QUADRANT COMPARISON:  April 07, 2021. FINDINGS: Gallbladder: Sludge and cholelithiasis is noted. No gallbladder wall thickening or pericholecystic fluid is noted. No sonographic Murphy's sign is noted. Common bile duct: Diameter: 4 mm which is within normal limits. Liver: No focal lesion identified. Within normal limits in parenchymal echogenicity. Portal vein is patent on color Doppler imaging with normal direction of blood flow towards the liver. Other: Right renal cyst is noted. IMPRESSION: Sludge and cholelithiasis is noted. No other significant abnormality seen in the right upper quadrant of the abdomen. Electronically Signed   By: Marijo Conception M.D.   On: 03/25/2021 19:57     Medications:    heparin 1,150 Units/hr (04/09/21 0751)    aspirin EC  81 mg Oral Daily   Chlorhexidine Gluconate Cloth  6 each Topical Daily   docusate   100 mg Per Tube BID   insulin aspart  0-5 Units Subcutaneous QHS   insulin aspart  0-6 Units Subcutaneous TID WC   polyethylene glycol  17 g Per Tube Daily   sodium chloride flush  3 mL Intravenous Q12H   sodium zirconium cyclosilicate  10 g Oral TID   fentaNYL (SUBLIMAZE) injection, fentaNYL (SUBLIMAZE) injection, midazolam, midazolam  Assessment/ Plan:  Dialysis Orders: [facility] MWF 4HR 15MIN, 3K, 2.ca , EDW 70.0 kg just lowered half kilogram  Right IJ PC ,LUA AVGG inserted 7/22/220 Mircera 60 MCG on 03/19/21 2000 units heparin heparin Calcitriol 1.75 MCG   Assessment/Plan Non-STEMI= plan per admit/cardiology consulted.  She was scheduled for cardiac catheterization but was having chest pain and refused. ESRD -HD MWF patient underwent dialysis 03/21/2021.  Will need to continue dialysis 04/09/2021 may be a good idea to place patient on CRRT for volume removal. Hypertension/volume  -we will start CRRT for volume removal. 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

## 2021-04-09 NOTE — Progress Notes (Signed)
STAT EEG completed; results pending. Dr Yadav notified. 

## 2021-04-09 NOTE — Progress Notes (Signed)
Hypoglycemic Event  CBG: 41   Treatment: D50 50 mL (25 gm)  Symptoms: non-responsive  Follow-up CBG: [id number] CBG Result:143  Possible Reasons for Event: Other: posible seizure activity  Comments/MD notified:Dr. Broadus John at bedside.     Myrtis Hopping

## 2021-04-09 NOTE — Consult Note (Signed)
Neurology Consultation  Reason for Consult: Concern for seizure, CTH with small infarcts within Amber bilateral cerebellar hemispheres  Referring Physician: S. Minor, NP  CC: Right-sided weakness on examination   History is obtained from: Bedside RN, chart review, unable to obtain from Young due to Young's condition- intubated   HPI: Amber Young is a 65 y.o. female with a medical history significant for atrial fibrillation, chronic combined systolic and diastolic CHF with EF of [id number], ESRD on HD M,W,F, CAD s/p PCI to LAD in 2017, RCA 2019, hyperlipidemia, essential hypertension, stroke without residual deficits, type 2 diabetes mellitus, and a recent AV graft placement of Amber left arm on 8/25, who presented to Amber ED 03/25/2021 for evaluation of 2 days of malaise, weakness, fatigue, and decreased appetite and was found to have an NSTEMI. She was admitted and placed on a heparin drip and initially was noted to be confused and her husband reported a waxing and waning mental status for 4 months and her cardiac catheterization was cancelled due to her altered mental status. Per bedside RN, Young's husband noted that at around 5-6 pm last night Amber Young's eyes rolled back and leftward and had decreased verbal responsiveness at that time. Around 5AM Amber morning, he noted that she was still altered and she developed dyspnea requiring intubation for airway protection. While in Amber ICU, she was noted to have left arm twitching concerning for seizure activity. A CTH was obtained with evidence of small infarcts within Amber bilateral cerebellar hemispheres new from MRI brain 2020 and neurology was consulted for further evaluation.   Prior to admission Modified Rankin Scale: 0-Completely asymptomatic and back to baseline post- stroke  ROS: Unable to obtain due to altered mental status, Young intubated in Amber ICU.  Past Medical History:  Diagnosis Date   Anemia    Atrial fibrillation ([place])  06/17/2019   Cataract, bilateral    Chronic combined systolic and diastolic CHF (congestive heart failure) ([place])    a. EF 40-45% in 2019 b. EF 25-30% by repeat echo in 01/2019   CKD (chronic kidney disease), stage IV (HCC)    Coronary artery disease 08/2017   DES x 2 RCA   Dialysis [place] ([place]) 07/2019   DKA (diabetic ketoacidoses) 06/16/2019   GERD (gastroesophageal reflux disease)    Glaucoma, left eye    Heart murmur    Hyperlipidemia    Hypertension    Proliferative diabetic retinopathy ([place])    left eye with vitreous hemorrhage and tractional retinal detachment   Stroke ([place]) 09/2014   numbness left upper lip, finger tips on left hand; "resolved" (05/18/2016)   Type II diabetes mellitus ([place])    Wears glasses    Past Surgical History:  Procedure Laterality Date   AV FISTULA PLACEMENT Left 07/10/2019   Procedure: ARTERIOVENOUS (AV) FISTULA CREATION;  Surgeon: Angelia Mould, MD;  Location: [place];  Service: Vascular;  Laterality: Left;   AV FISTULA PLACEMENT Left 04/03/2021   Procedure: INSERTION OF LEFT UPPER ARM ARTERIOVENOUS (AV) GORE-TEX GRAFT;  Surgeon: Serafina Mitchell, MD;  Location: [place];  Service: Vascular;  Laterality: Left;   CARDIAC CATHETERIZATION N/A 05/18/2016   Procedure: Left Heart Cath and Coronary Angiography;  Surgeon: Jettie Booze, MD;  Location: [place] [facility];  Service: Cardiovascular;  Laterality: N/A;   CARDIAC CATHETERIZATION N/A 05/18/2016   Procedure: Coronary Stent Intervention;  Surgeon: Jettie Booze, MD;  Location: [place] [facility];  Service: Cardiovascular;  Laterality: N/A;  CATARACT EXTRACTION Right 2020   COLONOSCOPY W/ BIOPSIES AND POLYPECTOMY     CORONARY ANGIOPLASTY     CORONARY ANGIOPLASTY WITH STENT PLACEMENT     CORONARY STENT INTERVENTION N/A 09/07/2017   Procedure: CORONARY STENT INTERVENTION;  Surgeon: Leonie Man, MD;  Location: [place] [facility];  Service: Cardiovascular;  Laterality: N/A;   EYE  SURGERY     GAS INSERTION Left 01/13/2018   Procedure: INSERTION OF GAS;  Surgeon: Bernarda Caffey, MD;  Location: [place];  Service: Ophthalmology;  Laterality: Left;   INSERTION OF DIALYSIS CATHETER Right 07/10/2019   Procedure: INSERTION OF TUNNELED DIALYSIS CATHETER;  Surgeon: Angelia Mould, MD;  Location: [facility];  Service: Vascular;  Laterality: Right;   IR FLUORO GUIDE CV LINE RIGHT  07/05/2019   IR US GUIDE VASC ACCESS RIGHT  07/05/2019   LEFT HEART CATH AND CORONARY ANGIOGRAPHY N/A 09/07/2017   Procedure: LEFT HEART CATH AND CORONARY ANGIOGRAPHY;  Surgeon: Leonie Man, MD;  Location: [place] [facility];  Service: Cardiovascular;  Laterality: N/A;   LEFT HEART CATH AND CORONARY ANGIOGRAPHY N/A 04/05/2021   Procedure: LEFT HEART CATH AND CORONARY ANGIOGRAPHY;  Surgeon: Belva Crome, MD;  Location: [place] [facility];  Service: Cardiovascular;  Laterality: N/A;  Canceled   MEMBRANE PEEL Left 01/13/2018   Procedure: MEMBRANE PEEL LEFT EYE ;  Surgeon: Bernarda Caffey, MD;  Location: [place];  Service: Ophthalmology;  Laterality: Left;   MULTIPLE TOOTH EXTRACTIONS     PARS PLANA VITRECTOMY Left 01/13/2018   Procedure: PARS PLANA VITRECTOMY WITH 25 GAUGE LEFT EYE WITH ENDOLASER;  Surgeon: Bernarda Caffey, MD;  Location: [place];  Service: Ophthalmology;  Laterality: Left;   [place]   "partial; fibroids"   Family History  Problem Relation Age of Onset   Diabetes Mother    Hypertension Mother    COPD Mother    Heart failure Mother    Hypertension Sister    Hypertension Brother    Social History:   reports that she has never smoked. She has never used smokeless tobacco. She reports that she does not drink alcohol and does not use drugs.  Medications  Current Facility-Administered Medications:    aspirin EC tablet 81 mg, 81 mg, Oral, Daily, Jerilynn Birkenhead, RPH   B-complex with vitamin C tablet 1 tablet, 1 tablet, Oral, Daily, Meier, Hortencia Conradi, MD   Chlorhexidine Gluconate Cloth 2 % PADS 6 each, 6 each, Topical, Daily, Kristopher Oppenheim, DO, 6 each at 04/03/2021 2126   docusate (COLACE) 50 MG/5ML liquid 100 mg, 100 mg, Per Tube, BID, Minor, Grace Bushy, NP   feeding supplement (PROSource TF) liquid 45 mL, 45 mL, Per Tube, TID, Verlee Monte Hortencia Conradi, MD   feeding supplement (VITAL 1.5 CAL) liquid 1,000 mL, 1,000 mL, Per Tube, Continuous, Meier, Hortencia Conradi, MD   fentaNYL (SUBLIMAZE) injection 50 mcg, 50 mcg, Intravenous, Q15 min PRN, Minor, Grace Bushy, NP   fentaNYL (SUBLIMAZE) injection 50-200 mcg, 50-200 mcg, Intravenous, Q30 min PRN, Minor, Grace Bushy, NP   heparin injection 1,000-6,000 Units, 1,000-6,000 Units, CRRT, PRN, [place] Oh, MD   insulin aspart (novoLOG) injection 0-5 Units, 0-5 Units, Subcutaneous, QHS, Bridgett Larsson, Eric, DO   insulin aspart (novoLOG) injection 0-6 Units, 0-6 Units, Subcutaneous, TID WC, Kristopher Oppenheim, DO   midazolam (VERSED) injection 2 mg, 2 mg, Intravenous, Q15 min PRN, Minor, Grace Bushy, NP   midazolam (VERSED) injection 2 mg, 2  mg, Intravenous, Q2H PRN, Minor, Grace Bushy, NP   perflutren lipid microspheres (DEFINITY) IV suspension, 1-10 mL, Intravenous, PRN, O'Neal, Cassie Freer, MD, 2 mL at 04/09/21 1522   polyethylene glycol (MIRALAX / GLYCOLAX) packet 17 g, 17 g, Per Tube, Daily, Minor, Grace Bushy, NP   prismasol BGK 2/2.5 dialysis solution, , CRRT, Continuous, [place] Oh, MD   prismasol BGK 2/2.5 replacement solution, , CRRT, Continuous, Edrick Oh, MD   prismasol BGK 2/2.5 replacement solution, , CRRT, Continuous, Edrick Oh, MD   sodium chloride 0.9 % primer fluid for CRRT, , CRRT, PRN, [place]rick Oh, MD   sodium chloride flush (NS) 0.9 % injection 3 mL, 3 mL, Intravenous, Q12H, Bhagat, Bhavinkumar, PA, 3 mL at 03/22/2021 2333  Exam: Current vital signs: BP (!) 142/57 (BP Location: Right Arm)   Pulse (!) 50   Temp 97.8 F (36.6 C) (Axillary)   Resp 18   Ht '5\' 7"'$  (1.702 m)   Wt 70.7 kg   SpO2 100%   BMI  24.41 kg/m  Vital signs in last 24 hours: Temp:  [97.6 F (36.4 C)-98.5 F (36.9 C)] 97.8 F (36.6 C) (08/31 1533) Pulse Rate:  [43-82] 50 (08/31 1508) Resp:  [0-38] 18 (08/31 1600) BP: (96-142)/(37-82) 142/57 (08/31 1600) SpO2:  [94 %-100 %] 100 % (08/31 1600) FiO2 (%):  [40 %-100 %] 40 % (08/31 1600) Weight:  [69.5 kg-70.7 kg] 70.7 kg (08/31 0903)  GENERAL: Intubated, not on sedation in Amber ICU Psych: Unable to assess due to Young's condition Head: Normocephalic and atraumatic, without obvious abnormality EENT: Normal conjunctivae, dry mucous membranes, ETT secured at right mouth. Scant secretions with inline suctioning. Young has spontaneous respirations over set ventilator rate.  LUNGS: ETT with respirations supported by mechanical ventilator, SpO2 100% on cardiac monitor.  CV: Irregular rate and rhythm on cardiac monitor ABDOMEN: Soft, non-distended Extremities: warm, well perfused, without obvious deformity  NEURO:  Mental Status:  Intubated and sedated, opens eyes spontaneously but does not fixate or track examiner.  She does not follow commands. No neglect is noted.  Cranial Nerves:  II: PERRL 3 mm/brisk.  III, IV, VI: Oculocephalic reflex is intact V: Corneal reflexes are intact. Blinks to threat throughout.  VII: Unable to assess facial asymmetry 2/2 ETT VIII: Unable to assess due to Young condition IX, X: Cough and gag reflexes are intact XI: Head is grossly midline XII: Unable to assess due to Young condition Motor: Right upper and lower extremities are without spontaneous movement or withdraw to noxious stimuli Left upper extremity withdraws to noxious stimuli, left lower extremity with extension movement with application of noxious stimuli and does not withdraw.  Left wrist with rhythmic twitching with stimulation and intermittently at rest.  Tone is normal. Bulk is normal.  Sensation: Young withdraws left upper and lower extremities with application  of noxious stimuli. Right hemibody without response to noxious stimuli but with application of noxious stimuli on Amber right, Amber corresponding left extremity reacts.  Coordination: Unable to assess due to Young's condition DTRs: 2+ and symmetric biceps and brachioradialis Gait: Deferred  1a Level of Conscious.: 1 1b LOC Questions: 2 1c LOC Commands: 2 2 Best Gaze: 0 3 Visual: 0 4 Facial Palsy: 0 5a Motor Arm - left: 2 5b Motor Arm - Right: 4 6a Motor Leg - Left: 3 6b Motor Leg - Right: 4 7 Limb Ataxia: 0 8 Sensory: 0 9 Best Language: 3 10 Dysarthria: Unable to assess 2/2 intubation 11 Extinct. and Inatten.: 0  TOTAL: 21  Labs I have reviewed labs in epic and Amber results pertinent to Amber consultation are: CBC    Component Value Date/Time   WBC 12.2 (H) 04/09/2021 0248   RBC 4.13 04/09/2021 0248   HGB 10.5 (L) 04/09/2021 0930   HGB 8.4 (L) 01/24/2018 1048   HCT 31.0 (L) 04/09/2021 0930   HCT 27.2 (L) 01/24/2018 1048   PLT 297 04/09/2021 0248   PLT 356 01/24/2018 1048   MCV 83.5 04/09/2021 0248   MCV 78 (L) 01/24/2018 1048   MCH 27.1 04/09/2021 0248   MCHC 32.5 04/09/2021 0248   RDW 16.9 (H) 04/09/2021 0248   RDW 17.6 (H) 01/24/2018 1048   LYMPHSABS 1.4 03/10/2021 0454   LYMPHSABS 1.6 01/24/2018 1048   MONOABS 0.7 03/13/2021 0454   EOSABS 0.0 03/30/2021 0454   EOSABS 0.1 01/24/2018 1048   BASOSABS 0.0 03/29/2021 0454   BASOSABS 0.0 01/24/2018 1048   CMP     Component Value Date/Time   NA 136 04/09/2021 1450   NA 140 04/25/2019 1202   K 6.0 (H) 04/09/2021 1450   CL 95 (L) 04/09/2021 1450   CO2 17 (L) 04/09/2021 1450   GLUCOSE 158 (H) 04/09/2021 1450   BUN 47 (H) 04/09/2021 1450   BUN 71 (H) 04/25/2019 1202   CREATININE 6.22 (H) 04/09/2021 1450   CREATININE 1.87 (H) 05/27/2018 1221   CALCIUM 9.2 04/09/2021 1450   PROT 6.7 04/09/2021 0248   PROT 6.4 01/24/2018 1048   ALBUMIN 3.2 (L) 04/09/2021 1450   ALBUMIN 3.1 (L) 01/24/2018 1048   AST 1,363 (H)  04/09/2021 0248   AST 15 12/13/2017 1049   ALT 372 (H) 04/09/2021 0248   ALT 9 12/13/2017 1049   ALKPHOS 115 04/09/2021 0248   BILITOT 1.5 (H) 04/09/2021 0248   BILITOT <0.2 01/24/2018 1048   BILITOT 0.2 12/13/2017 1049   GFRNONAA 7 (L) 04/09/2021 1450   GFRNONAA 31 (L) 12/13/2017 1049   GFRNONAA 43 (L) 07/09/2017 1047   GFRAA 11 (L) 07/13/2019 0500   GFRAA 36 (L) 12/13/2017 1049   GFRAA 50 (L) 07/09/2017 1047   Lipid Panel     Component Value Date/Time   CHOL 165 03/18/2021 0454   TRIG 62 03/27/2021 0454   HDL 63 03/27/2021 0454   CHOLHDL 2.6 04/06/2021 0454   VLDL 12 04/09/2021 0454   LDLCALC 90 04/06/2021 0454   Lab Results  Component Value Date   HGBA1C 7.0 (H) 03/12/2021   Imaging I have reviewed Amber images obtained:  CT-scan of Amber brain 04/09/2021: Small infarcts within Amber bilateral cerebellar hemispheres are new as compared to Amber brain MRI of 06/17/2019, but otherwise age-indeterminate. Consider a brain MRI for further evaluation. Redemonstrated chronic lacunar infarcts versus prominent perivascular spaces within bilateral basal ganglia. Redemonstrated chronic lacunar infarct within Amber right thalamus. Moderate chronic small vessel ischemic changes within cerebral white matter. Mild generalized parenchymal atrophy.  MRI brain wo 04/09/2021: 1. Multiple small acute/subacute infarcts in Amber left ACA and left PICA territories, suggesting embolic events. 2. Moderate chronic microvascular ischemic changes of Amber white matter and parenchymal.  EEG 04/09/2021: "Amber study is suggestive of moderate diffuse encephalopathy, nonspecific etiology. No seizures or epileptiform discharges were seen throughout Amber recording."  Echocardiogram 04/09/2021: 1. Left ventricular ejection fraction, by estimation, is 25 to 30%. Amber left ventricle has severely decreased function. Amber left ventricle demonstrates global hypokinesis. There is moderate left ventricular hypertrophy. Left  ventricular diastolic function could not be evaluated. Elevated left  ventricular end-diastolic pressure. There is akinesis of Amber left ventricular, mid-apical septal wall.   2. Right ventricular systolic function is mildly reduced. Amber right ventricular size is normal.   3. Left atrial size was moderately dilated.   4. Right atrial size was mildly dilated.   5. Amber mitral valve is abnormal. Mild mitral valve regurgitation. No evidence of mitral stenosis. Moderate to severe mitral annular calcification.   6. Amber aortic valve is grossly normal. There is mild calcification of Amber aortic valve. There is mild thickening of Amber aortic valve. Aortic valve regurgitation is not visualized. Mild to moderate aortic valve sclerosis/calcification is present, without any evidence of aortic stenosis.   Assessment: 65 y.o. female who presented to Amber ED for evaluation of malaise, weakness, and decreased appetite who was found to have an NSTEMI and to be confused and drowsy. Her cardiac catheterization was cancelled in Amber setting of Young's AMS. Last night, Young's husband noted that at around [place], she rolled her eyes back and to Amber left and had subsequent decreased verbalization that persisted throughout Amber night into Amber morning. She experienced dyspnea Amber morning requiring intubation. A CTH was obtained revealing small infarcts within Amber bilateral cerebellar hemispheres. She was also seen to have left hand rhythmic twitching concerning for seizure activity.  - Examination reveals Young with right hemiparesis, Young does not fixate or track examiner, does not follow commands, and has intermittent left hand rhythmic twitching, worse with stimulation.  - CTH Amber morning revealed small infarcts within Amber bilateral cerebellar hemispheres. MRI brain was complete with evidence of multiple small acute/subacute infarcts in Amber left ACA and left PICA territories suggesting embolic events.  - Routine EEG was  completed today with evidence of moderate diffuse encephalopathy with nonspecific etiology but without seizures or epileptiform discharges. Focal motor seizure involving only Amber distal LUE as described may not be easily picked up on scalp EEG. - Young's husband notes waxing and waning confusion for approximately 4 months.  - Presentation is likely due to acute ischemic stroke with acute/subacute infarcts. Encephalopathy differentials include stroke with or without superimposing toxic-metabolic or infectious etiologies versus delirium. Left wrist rhythmic twitching is concerning for a focal motor seizure that may not be visualized on EEG, will obtain LTM EEG monitoring for further evaluation.   Recommendations: - LDL elevated above goal of < 70; continue statin therapy - MRA of Amber head and neck without contrast due to Young with ESRD  - Frequent neuro checks - Prophylactic therapy- okay to continue ASA and heparin per cardiology; though there is a risk of hemorrhagic conversion that is increased with Amber use of heparin, Amber benefits of heparin therapy in Amber Young with a simultaneous NSTEMI likely outweigh Amber risks.  - Risk factor modification - Telemetry monitoring - PT consult, OT consult, Speech consult - Stroke team to follow - LTM EEG for spell characterization  Pt seen by NP/Neuro and later by MD. Note/plan to be edited by MD as needed.  Anibal Henderson, AGAC-NP [facility] Pager: [phone]  Neurology Attending Attestation   I examined Amber Young and discussed plan with Ms. Toberman NP. Above note has been edited by me to reflect my findings and recommendations.   Amber Young is critically ill and at significant risk of neurological worsening, death and care requires constant monitoring of vital signs, hemodynamics,respiratory and cardiac monitoring, neurological assessment, discussion with family, other specialists and medical decision making of high  complexity. I spent 55 minutes of neurocritical care  time  in Amber care of  Amber Young. Amber was time spent independent of any time provided by nurse practitioner or PA.  Su Monks, MD [facility] [phone]  If 7pm- 7am, please page neurology on call as listed in [place].   Su Monks, MD [facility] [phone]   If 7pm- 7am, please page neurology on call as listed in [place].

## 2021-04-09 NOTE — Progress Notes (Addendum)
Further workup by critical care underway for today's events. Dr. O'Neal feels like her cardiac issues are driven by underlying medical issues needing further evaluation. Per his report, if primary teams feel they need to hold heparin, Dr. O'Neal states OK to do so from cardiac standpoint. He recommends obtaining ESR and CRP as part of workup, ? vasculitis process. Per nurse, patient is hard stick and having to have labs consolidated to AM draw - will see if we can add these onto blood already drawn, but if not, Dr. O'Neal is OK with obtaining tomorrow. Cardiology will continue to follow. Have signed out to on call APP to f/u echo.    

## 2021-04-09 NOTE — Progress Notes (Addendum)
Cross cover note: received page that patient is on amiodarone drip at '30mg'$ /hr, currently maintaining HR high 40s/low 50s (sinus - converted yesterday per notes). BP stable. Per nurse, mentation remains similar to that overnight - has had AMS this admission. Given bradycardia, will stop amiodarone for now, also asked nurse to hold on giving carvedilol this AM until patient seen in rounds. Will also obtain f/u EKG. Addendum: f/u EKG shows SB 56bpm, voltage criteria for LVH, diffuse STTW changes and prolonged QTc 571m - STTW changes similar to yesterday but QT prolonged.

## 2021-04-09 NOTE — Progress Notes (Signed)
Pt transported to MRI and back to 46M 13 on full vent support. No complications noted.

## 2021-04-09 NOTE — Progress Notes (Addendum)
Initial Nutrition Assessment  DOCUMENTATION CODES:   Not applicable  INTERVENTION:   Follow potassium trend  Tube feeding:  -Vital 1.5 @ 20 ml/hr via Cortrak -Increase by 10 ml Q6 hours t o goal rate of 50 ml/hr (1200 ml) -ProSource TF 45 ml TID -Add B complex with Vitamin C to account for losses with CRRT  Provides: 1920 kcals, 114 grams protein, 917 ml free water.   NUTRITION DIAGNOSIS:   Increased nutrient needs related to acute illness as evidenced by estimated needs.  GOAL:   Patient will meet greater than or equal to 90% of their needs  MONITOR:   Vent status, TF tolerance, Weight trends, Labs, Diet advancement, I & O's  REASON FOR ASSESSMENT:   Ventilator, Consult Assessment of nutrition requirement/status  ASSESSMENT:   Patient with PMH significant for CHF, ESRD on HD, CAD s/p DESx2 RCA, GERD, HLD, HTN, stroke, and DM. Presents this admission with NSTEMI.   Had poor respiratory effort and unresponsiveness this am. Intubated for airway protection. Potassium increased. Expect to see improvement upon initiation of CRRT. Titrate tube feeding to goal slowly via Cortrak. Follow electrolyte trends.   Of note patient had stable appetite PTA. Denies any dry weight loss.   EDW: 70 kg Current weight: 70.7 kg   Last HD net UF: 1020 ml   Medications: colace, SS novolog, miralax Labs: Na 132 (L) K 6.0 (H) LFTs elevated CBG 41-143  NUTRITION - FOCUSED PHYSICAL EXAM:  Flowsheet Row Most Recent Value  Orbital Region No depletion  Upper Arm Region No depletion  Thoracic and Lumbar Region No depletion  Buccal Region No depletion  Temple Region No depletion  Clavicle Bone Region Mild depletion  Clavicle and Acromion Bone Region Mild depletion  Scapular Bone Region Unable to assess  Dorsal Hand No depletion  Patellar Region No depletion  Anterior Thigh Region Mild depletion  Posterior Calf Region Mild depletion  Edema (RD Assessment) None  Hair Reviewed  Eyes  Reviewed  Mouth Reviewed  Skin Reviewed  Nails Reviewed      Diet Order:   Diet Order             Diet heart healthy/carb modified Room service appropriate? Yes; Fluid consistency: Thin  Diet effective now                   EDUCATION NEEDS:   Not appropriate for education at this time  Skin:  Skin Assessment: Skin Integrity Issues: Skin Integrity Issues:: Incisions Incisions: arm  Last BM:  PTA  Height:   Ht Readings from Last 1 Encounters:  04/09/21 '5\' 7"'$  (1.702 m)    Weight:   Wt Readings from Last 1 Encounters:  04/09/21 70.7 kg    BMI:  Body mass index is 24.41 kg/m.  Estimated Nutritional Needs:   Kcal:  1750-1950 kcal  Protein:  110-125 grams  Fluid:  1000 ml + UOP (liberalize wih CRRT)  Brittie Whisnant MS, RD, LDN, CNSC Clinical Nutrition Pager listed in [place]

## 2021-04-09 NOTE — Progress Notes (Signed)
Patient non-responsive to painful stimuli at this time.  Breathing noted to be rapid at times in 30s than decreasing to 20s.  Rapid response and DrBroadus John notified and at bedside.  Given 1 mg ativan IV per Dr. Broadus John. STAT CT ordered.

## 2021-04-09 NOTE — Progress Notes (Signed)
  Echocardiogram 2D Echocardiogram has been performed.  Bobbye Charleston 04/09/2021, 3:23 PM

## 2021-04-09 NOTE — Progress Notes (Signed)
Pt transported to CT and back to 109M 13 on full vent support. No complications noted.

## 2021-04-09 NOTE — Progress Notes (Addendum)
Progress Note  Patient Name: Amber Young Date of Encounter: 04/09/2021  Primary Cardiologist: Skeet Latch, MD  Subjective   Had decreased responsiveness this AM, unresponsive to painful stimuli with elevated RR. Rapid response called. Seizure like activity noted -> received Ativan with resolution. Required intubation for decreased responsiveness.   HR upper 30s-50s this AM so amiodarone discontinued. BP remained [id number] systolic + per logs during event.  Inpatient Medications    Scheduled Meds:  aspirin EC  81 mg Oral Daily   Chlorhexidine Gluconate Cloth  6 each Topical Daily   hydrALAZINE  25 mg Oral Q8H   insulin aspart  0-5 Units Subcutaneous QHS   insulin aspart  0-6 Units Subcutaneous TID WC   isosorbide mononitrate  30 mg Oral Daily   sodium chloride flush  3 mL Intravenous Q12H   sodium zirconium cyclosilicate  10 g Oral TID   Continuous Infusions:  heparin 1,150 Units/hr (04/09/21 0751)   PRN Meds: ondansetron **OR** ondansetron (ZOFRAN) IV   Vital Signs    Vitals:   04/09/21 0615 04/09/21 0715 04/09/21 0716 04/09/21 0743  BP: (!) 113/59 (!) 111/53  (!) 108/56  Pulse: (!) 54 (!) 46  (!) 46  Resp: 20 (!) 0  (!) 28  Temp:      TempSrc:      SpO2: 100% 96%  97%  Weight:      Height:   '5\' 7"'$  (1.702 m)     Intake/Output Summary (Last 24 hours) at 04/09/2021 0821 Last data filed at 04/09/2021 0751 Gross per 24 hour  Intake 1699.38 ml  Output 1020 ml  Net 679.38 ml   Last 3 Weights 04/09/2021 04/06/2021 03/11/2021  Weight (lbs) 153 lb 3.5 oz (No Data) (No Data)  Weight (kg) 69.5 kg (No Data) (No Data)     Telemetry    SB upper 30s-50s - Personally Reviewed  ECG    EKG shows SB 56bpm, voltage criteria for LVH, diffuse STTW changes and prolonged QTc 586m - STTW changes similar to yesterday but QT prolonged. - Personally Reviewed  Physical Exam   Brief exam given acutely moved to CT GEN: No acute distress, lying flat, poor inspiratory effort   HEENT: Normocephalic, atraumatic, sclera non-icteric. Neck: No masses Cardiac: Bradycardic, regular, low 50s, no murmurs, rubs, or gallops.  Respiratory: poor inspiratory effort but breathing, no wheezing, rales or rhonchi GI: Soft, nontender, non-distended, BS +x 4. MS: no acute deformity. Extremities: No clubbing or cyanosis. No edema. Distal pedal pulses are 2+ and equal bilaterally. Neuro:  AAOx3. Follows commands. Psych:  unable to assess  Labs    High Sensitivity Troponin:   Recent Labs  Lab 03/12/2021 0806 03/22/2021 0930 04/01/2021 1442 03/14/2021 1830 03/27/2021 0454  TROPONINIHS 1,320* 1,605* 2,745* 5,564* 14,294*      Cardiac EnzymesNo results for input(s): TROPONINI in the last 168 hours. No results for input(s): TROPIPOC in the last 168 hours.   Chemistry Recent Labs  Lab 03/15/2021 0806 03/13/2021 0454 04/09/21 0248  NA 137 136 137  K 6.0* 5.7* 5.5*  CL 98 96* 98  CO2 17* 18* 17*  GLUCOSE 73 85 105*  BUN 57* 78* 34*  CREATININE 8.12* 9.62* 5.43*  CALCIUM 9.7 9.1 9.0  PROT  --  6.2* 6.7  ALBUMIN  --  3.0* 3.2*  AST  --  1,077* 1,363*  ALT  --  212* 372*  ALKPHOS  --  73 115  BILITOT  --  1.3* 1.5*  GFRNONAA 5* 4*  8*  ANIONGAP 22* 22* 22*     Hematology Recent Labs  Lab 03/19/2021 0806 03/25/2021 0454 04/09/21 0248  WBC 8.3 8.5 12.2*  RBC 4.13 3.54* 4.13  HGB 11.2* 9.6* 11.2*  HCT 36.9 30.0* 34.5*  MCV 89.3 84.7 83.5  MCH 27.1 27.1 27.1  MCHC 30.4 32.0 32.5  RDW 16.7* 16.4* 16.9*  PLT 328 256 297    BNPNo results for input(s): BNP, PROBNP in the last 168 hours.   DDimer No results for input(s): DDIMER in the last 168 hours.   Radiology    CT ABDOMEN PELVIS WO CONTRAST  Result Date: 04/02/2021 CLINICAL DATA:  Left lower quadrant pain. EXAM: CT ABDOMEN AND PELVIS WITHOUT CONTRAST TECHNIQUE: Multidetector CT imaging of the abdomen and pelvis was performed following the standard protocol without IV contrast. COMPARISON:  Abdomen pelvis 09/07/2020,  ultrasound renal 06/16/2019. FINDINGS: Lower chest: Bronchial wall thickening and patchy airspace opacity at the left lung again noted. Left base atelectasis. Findings suggestive of anemia. Enlarged left ventricle. Partially visualized central venous catheter terminating within the right atrium tiny hiatal hernia. Hepatobiliary: Similar-appearing nonspecific 0.8 cm hypodensity (3:23). Layering hyperdensity within the gallbladder lumen consistent with tiny gallstones. No gallbladder wall thickening or pericholecystic fluid. No biliary dilatation. Pancreas: No focal lesion. Normal pancreatic contour. No surrounding inflammatory changes. No main pancreatic ductal dilatation. Spleen: Normal in size without focal abnormality. Adrenals/Urinary Tract: No adrenal nodule bilaterally. No nephrolithiasis and no hydronephrosis. There is a similar-appearing 2.4 x 1.6 x 3.5 cm No ureterolithiasis or hydroureter. The urinary bladder is unremarkable. Stomach/Bowel: Stomach is within normal limits. No evidence of bowel wall thickening or dilatation. Appendix appears normal. Vascular/Lymphatic: No abdominal aorta or iliac aneurysm. Severe atherosclerotic plaque of the aorta and its branches. No abdominal, pelvic, or inguinal lymphadenopathy. Reproductive: Status post hysterectomy. No adnexal masses. Other: No intraperitoneal free fluid. No intraperitoneal free gas. No organized fluid collection. Musculoskeletal: No abdominal wall hernia or abnormality. No suspicious lytic or blastic osseous lesions. No acute displaced fracture. IMPRESSION: 1. Cholelithiasis with no CT evidence of acute cholecystitis or choledocholithiasis. 2. Similar-appearing right parapelvic cyst. 3. No acute abnormality with limited evaluation on this noncontrast study. Electronically Signed   By: Iven Finn M.D.   On: 03/28/2021 19:08   DG Chest 2 View  Result Date: 03/22/2021 CLINICAL DATA:  Weakness, decreased appetite, chest pain EXAM: CHEST - 2 VIEW  COMPARISON:  Chest radiograph 09/07/2020 FINDINGS: A left-sided dialysis catheter is in place terminating over the right atrium. The heart is mildly enlarged. The mediastinal contours are within normal limits. Lung volumes are low. There is no focal consolidation or pulmonary edema. There is no pleural effusion or pneumothorax. The bones are unremarkable. A left axillary vascular stent is noted. IMPRESSION: Mild cardiomegaly and low lung volumes. Otherwise, no radiographic evidence of acute cardiopulmonary process. Electronically Signed   By: Valetta Mole M.D.   On: 04/03/2021 08:34   CT HEAD WO CONTRAST (5MM)  Result Date: 04/04/2021 CLINICAL DATA:  Mental status change. EXAM: CT HEAD WITHOUT CONTRAST TECHNIQUE: Contiguous axial images were obtained from the base of the skull through the vertex without intravenous contrast. COMPARISON:  None. FINDINGS: Brain: No acute intracranial hemorrhage. No focal mass lesion. No CT evidence of acute infarction. No midline shift or mass effect. No hydrocephalus. Basilar cisterns are patent. Small lacunar infarction in the RIGHT basal ganglia (image 14/5. No change from prior. There are periventricular and subcortical white matter hypodensities. Generalized cortical atrophy. Vascular: No hyperdense vessel  or unexpected calcification. Skull: Normal. Negative for fracture or focal lesion. Sinuses/Orbits: Paranasal sinuses and mastoid air cells are clear. Orbits are clear. Other: None. IMPRESSION: 1. No acute intracranial findings. 2. Atrophy and chronic white matter microvascular disease Electronically Signed   By: Suzy Bouchard M.D.   On: 03/13/2021 19:32   CARDIAC CATHETERIZATION  Result Date: 03/27/2021 Procedure was canceled at the patient's request   US ABDOMEN LIMITED RUQ (LIVER/GB)  Result Date: 03/21/2021 CLINICAL DATA:  Elevated liver function tests. EXAM: ULTRASOUND ABDOMEN LIMITED RIGHT UPPER QUADRANT COMPARISON:  April 07, 2021. FINDINGS: Gallbladder:  Sludge and cholelithiasis is noted. No gallbladder wall thickening or pericholecystic fluid is noted. No sonographic Murphy's sign is noted. Common bile duct: Diameter: 4 mm which is within normal limits. Liver: No focal lesion identified. Within normal limits in parenchymal echogenicity. Portal vein is patent on color Doppler imaging with normal direction of blood flow towards the liver. Other: Right renal cyst is noted. IMPRESSION: Sludge and cholelithiasis is noted. No other significant abnormality seen in the right upper quadrant of the abdomen. Electronically Signed   By: Marijo Conception M.D.   On: 03/26/2021 19:57    Cardiac Studies   Cath 03/28/2021 The patient refused the procedure at this time.  She complained of pain all over following dialysis.  The pain was particularly intense in the right and left arm.  She continued to repeat that she "wants to get up".  She expressed that she would not be able to lie down for the requisite 2 to 4 hours post procedure as a femoral approach is mandated given her renal failure.  She ultimately decided against having the procedure today.  Poke with the patient's husband and Dr. Audie Box before sending her to 6 E. There was no blood loss for this procedure.   During this procedure no sedation was administered.  2D Echo pending  Patient Profile     65 y.o. female with CAD S/p LAD PCI in 2017 and overlapping DES in 2019, PAF, chronic combined CHF, HTN, HLD, prior stroke, ESRD presented to the hospital 03/23/2021 with complaints of weakness, decreased appetite and feeling poorly. She underwent AV Graft placement 04/03/21. She had missed HD the day of admission due to feeling bad. She was also noted to be sleepy with altered mental status. This has waxed and waned during admission. Other findings notable for elevated procalcitonin, transaminitis, hyperkalemia. UA attempted 8/30 with milky white pus in catheter. Troponin elevated to 14k, therefore cardiology following  for NSTEMI. Cath attempted but aborted due to AMS, inability to comply with instructions. Also developed AF RVR during admission. On 8/31 became further unresponsive and developed seizurelike behavior requiring ativan. She had bradycardia in the upper 30s-low 40s, requiring cessation of amiodarone and holding of carvedilol.  Assessment & Plan   1. Constellation of symptoms including diffuse myalgias, altered mental status/acute encephalopathy, transaminitis, elevated procalcitonin, then seizure like activity on 8/31 - symptoms concerning for infective process - further w/u per IM - consider neurology eval  2. NSTEMI, prior hx of CAD - suspect this is a secondary process - NSTEMI would not specifically be driving the concern for infection or acute AMS - cath deferred due to AMS/inability to comply with directions - 2D echo pending - will discuss heparin rx with MD along with other current medical therapy ordered - ASA, Imdur - hold carvedilol due to softer BP/ sinus bradycardia - no statin due to elevated LFTS  3. Chronic combined CHF -  volume managed by HD - hold Imdur/hydralazine since unable to take orals at present time - 2D echo pending  4. Paroxysmal atrial fib with recurrence while admitted, along with sinus bradycardia this admission - on warfarin prior to admission with subtherapeutic INR on admission, currently on heparin - converted back to NSR/SB on IV amiodarone - stopped this AM due to bradycardia in the upper 30s/low 40s - hold carvedilol as well due to bradycardia  5. Transaminitis - per medical team  6. ESRD on HD with persistent hyperkalemia - per nephrology  7. Prolonged QTc - follow while inpatient, avoid QT prolonging agents - d/c zofran  For questions or updates, please contact [place] Please consult [email or web address] for contact info under Cardiology/STEMI.  Signed, Charlie Pitter, PA-C 04/09/2021, 8:21 AM

## 2021-04-10 ENCOUNTER — Inpatient Hospital Stay (HOSPITAL_COMMUNITY): Payer: Medicare Other

## 2021-04-10 DIAGNOSIS — R6521 Severe sepsis with septic shock: Secondary | ICD-10-CM

## 2021-04-10 DIAGNOSIS — I4819 Other persistent atrial fibrillation: Secondary | ICD-10-CM

## 2021-04-10 DIAGNOSIS — A419 Sepsis, unspecified organism: Secondary | ICD-10-CM

## 2021-04-10 DIAGNOSIS — R4182 Altered mental status, unspecified: Secondary | ICD-10-CM

## 2021-04-10 DIAGNOSIS — I214 Non-ST elevation (NSTEMI) myocardial infarction: Secondary | ICD-10-CM | POA: Diagnosis not present

## 2021-04-10 LAB — COOXEMETRY PANEL
Carboxyhemoglobin: 0.8 % (ref 0.5–1.5)
Methemoglobin: 1.1 % (ref 0.0–1.5)
O2 Saturation: 64.2 %
Total hemoglobin: 10 g/dL — ABNORMAL LOW (ref 12.0–16.0)

## 2021-04-10 LAB — RENAL FUNCTION PANEL
Albumin: 2.9 g/dL — ABNORMAL LOW (ref 3.5–5.0)
Albumin: 2.4 g/dL — ABNORMAL LOW (ref 3.5–5.0)
Anion gap: 16 — ABNORMAL HIGH (ref 5–15)
Anion gap: 16 — ABNORMAL HIGH (ref 5–15)
BUN: 39 mg/dL — ABNORMAL HIGH (ref 8–23)
BUN: 21 mg/dL (ref 8–23)
CO2: 21 mmol/L — ABNORMAL LOW (ref 22–32)
CO2: 22 mmol/L (ref 22–32)
Calcium: 8.8 mg/dL — ABNORMAL LOW (ref 8.9–10.3)
Calcium: 8.4 mg/dL — ABNORMAL LOW (ref 8.9–10.3)
Chloride: 100 mmol/L (ref 98–111)
Chloride: 100 mmol/L (ref 98–111)
Creatinine, Ser: 4.72 mg/dL — ABNORMAL HIGH (ref 0.44–1.00)
Creatinine, Ser: 2.24 mg/dL — ABNORMAL HIGH (ref 0.44–1.00)
GFR, Estimated: 10 mL/min — ABNORMAL LOW (ref >60)
GFR, Estimated: 24 mL/min — ABNORMAL LOW (ref >60)
Glucose, Bld: 99 mg/dL (ref 70–99)
Glucose, Bld: 228 mg/dL — ABNORMAL HIGH (ref 70–99)
Phosphorus: 5.2 mg/dL — ABNORMAL HIGH (ref 2.5–4.6)
Phosphorus: 3.1 mg/dL (ref 2.5–4.6)
Potassium: 4 mmol/L (ref 3.5–5.1)
Potassium: 3.3 mmol/L — ABNORMAL LOW (ref 3.5–5.1)
Sodium: 137 mmol/L (ref 135–145)
Sodium: 138 mmol/L (ref 135–145)

## 2021-04-10 LAB — PROTIME-INR
INR: 3.1 — ABNORMAL HIGH (ref 0.8–1.2)
INR: 3.2 — ABNORMAL HIGH (ref 0.8–1.2)
Prothrombin Time: 31.5 seconds — ABNORMAL HIGH (ref 11.4–15.2)
Prothrombin Time: 32.8 seconds — ABNORMAL HIGH (ref 11.4–15.2)

## 2021-04-10 LAB — CBC WITH DIFFERENTIAL/PLATELET
Abs Immature Granulocytes: 0.26 10*3/uL — ABNORMAL HIGH (ref 0.00–0.07)
Basophils Absolute: 0 10*3/uL (ref 0.0–0.1)
Basophils Relative: 0 %
Eosinophils Absolute: 0 10*3/uL (ref 0.0–0.5)
Eosinophils Relative: 0 %
HCT: 29.9 % — ABNORMAL LOW (ref 36.0–46.0)
Hemoglobin: 10 g/dL — ABNORMAL LOW (ref 12.0–15.0)
Immature Granulocytes: 1 %
Lymphocytes Relative: 5 %
Lymphs Abs: 0.9 10*3/uL (ref 0.7–4.0)
MCH: 27.5 pg (ref 26.0–34.0)
MCHC: 33.4 g/dL (ref 30.0–36.0)
MCV: 82.4 fL (ref 80.0–100.0)
Monocytes Absolute: 0.6 10*3/uL (ref 0.1–1.0)
Monocytes Relative: 3 %
Neutro Abs: 16.5 10*3/uL — ABNORMAL HIGH (ref 1.7–7.7)
Neutrophils Relative %: 91 %
Platelets: 246 10*3/uL (ref 150–400)
RBC: 3.63 MIL/uL — ABNORMAL LOW (ref 3.87–5.11)
RDW: 16.7 % — ABNORMAL HIGH (ref 11.5–15.5)
WBC: 18.3 10*3/uL — ABNORMAL HIGH (ref 4.0–10.5)
nRBC: 3.9 % — ABNORMAL HIGH (ref 0.0–0.2)

## 2021-04-10 LAB — HEPARIN LEVEL (UNFRACTIONATED)
Heparin Unfractionated: <0.1 IU/mL — ABNORMAL LOW (ref 0.30–0.70)
Heparin Unfractionated: 0.11 IU/mL — ABNORMAL LOW (ref 0.30–0.70)
Heparin Unfractionated: 0.11 IU/mL — ABNORMAL LOW (ref 0.30–0.70)

## 2021-04-10 LAB — BLOOD GAS, VENOUS
Acid-base deficit: 3.5 mmol/L — ABNORMAL HIGH (ref 0.0–2.0)
Bicarbonate: 20.3 mmol/L (ref 20.0–28.0)
Drawn by: 39542
FIO2: 40
O2 Saturation: 81.3 %
Patient temperature: 37
pCO2, Ven: 32.2 mmHg — ABNORMAL LOW (ref 44.0–60.0)
pH, Ven: 7.415 (ref 7.250–7.430)
pO2, Ven: 52.5 mmHg — ABNORMAL HIGH (ref 32.0–45.0)

## 2021-04-10 LAB — GLOBAL TEG PANEL
CFF Max Amplitude: 24.3 mm (ref 15–32)
CK with Heparinase (R): 7.2 min (ref 4.3–8.3)
Citrated Functional Fibrinogen: 443.4 mg/dL (ref 278–581)
Citrated Kaolin (K): 1.1 min (ref 0.8–2.1)
Citrated Kaolin (MA): 65.2 mm (ref 52–69)
Citrated Kaolin (R): 9 min (ref 4.6–9.1)
Citrated Kaolin Angle: 73.5 deg (ref 63–78)
Citrated Rapid TEG (MA): 66.4 mm (ref 52–70)

## 2021-04-10 LAB — HEPATIC FUNCTION PANEL
ALT: 2575 U/L — ABNORMAL HIGH (ref 0–44)
AST: 9649 U/L — ABNORMAL HIGH (ref 15–41)
Albumin: 3 g/dL — ABNORMAL LOW (ref 3.5–5.0)
Alkaline Phosphatase: 119 U/L (ref 38–126)
Bilirubin, Direct: 0.6 mg/dL — ABNORMAL HIGH (ref 0.0–0.2)
Indirect Bilirubin: 1.6 mg/dL — ABNORMAL HIGH (ref 0.3–0.9)
Total Bilirubin: 2.2 mg/dL — ABNORMAL HIGH (ref 0.3–1.2)
Total Protein: 6.2 g/dL — ABNORMAL LOW (ref 6.5–8.1)

## 2021-04-10 LAB — GLUCOSE, CAPILLARY
Glucose-Capillary: 133 mg/dL — ABNORMAL HIGH (ref 70–99)
Glucose-Capillary: 154 mg/dL — ABNORMAL HIGH (ref 70–99)
Glucose-Capillary: 195 mg/dL — ABNORMAL HIGH (ref 70–99)
Glucose-Capillary: 211 mg/dL — ABNORMAL HIGH (ref 70–99)
Glucose-Capillary: 276 mg/dL — ABNORMAL HIGH (ref 70–99)
Glucose-Capillary: 298 mg/dL — ABNORMAL HIGH (ref 70–99)
Glucose-Capillary: 92 mg/dL (ref 70–99)

## 2021-04-10 LAB — PHOSPHORUS: Phosphorus: 5.2 mg/dL — ABNORMAL HIGH (ref 2.5–4.6)

## 2021-04-10 LAB — ACETAMINOPHEN LEVEL: Acetaminophen (Tylenol), Serum: <10 ug/mL — ABNORMAL LOW (ref 10–30)

## 2021-04-10 LAB — MAGNESIUM: Magnesium: 2.6 mg/dL — ABNORMAL HIGH (ref 1.7–2.4)

## 2021-04-10 LAB — BETA-HYDROXYBUTYRIC ACID: Beta-Hydroxybutyric Acid: 0.87 mmol/L — ABNORMAL HIGH (ref 0.05–0.27)

## 2021-04-10 LAB — AMMONIA: Ammonia: 47 umol/L — ABNORMAL HIGH (ref 9–35)

## 2021-04-10 MED ORDER — ASPIRIN 81 MG PO CHEW: 81.0000 mg | CHEWABLE_TABLET | Freq: Every day | ORAL | Status: DC

## 2021-04-10 MED ORDER — PIPERACILLIN-TAZOBACTAM 3.375 G IVPB 30 MIN
3.3750 g | Freq: Four times a day (QID) | INTRAVENOUS | Status: DC
  Administered 2021-04-10 – 2021-04-11 (×4): 3.375 g via INTRAVENOUS
  Filled 2021-04-10 (×8): qty 50

## 2021-04-10 MED ORDER — DEXTROSE 5 % IV SOLN
6.2500 mg/kg/h | INTRAVENOUS | Status: AC
  Administered 2021-04-11 – 2021-04-12 (×2): 6.25 mg/kg/h via INTRAVENOUS
  Filled 2021-04-10 (×3): qty 120

## 2021-04-10 MED ORDER — SODIUM CHLORIDE 0.9 % IV SOLN
750.0000 mg | Freq: Two times a day (BID) | INTRAVENOUS | Status: DC
  Administered 2021-04-11 – 2021-04-15 (×9): 750 mg via INTRAVENOUS
  Filled 2021-04-10 (×10): qty 7.5

## 2021-04-10 MED ORDER — ACETYLCYSTEINE LOAD VIA INFUSION
150.0000 mg/kg | Freq: Once | INTRAVENOUS | Status: AC
  Administered 2021-04-10: 10305 mg via INTRAVENOUS
  Filled 2021-04-10: qty 258

## 2021-04-10 MED ORDER — NOREPINEPHRINE 4 MG/250ML-% IV SOLN: 0.0000 ug/min | INTRAVENOUS | Status: DC

## 2021-04-10 MED ORDER — NOREPINEPHRINE 16 MG/250ML-% IV SOLN
0.0000 ug/min | INTRAVENOUS | Status: DC
  Administered 2021-04-11: 10 ug/min via INTRAVENOUS
  Administered 2021-04-11: 15 ug/min via INTRAVENOUS
  Administered 2021-04-12: 18 ug/min via INTRAVENOUS
  Administered 2021-04-13: 19 ug/min via INTRAVENOUS
  Administered 2021-04-14: 23 ug/min via INTRAVENOUS
  Administered 2021-04-14: 55 ug/min via INTRAVENOUS
  Administered 2021-04-15: 40 ug/min via INTRAVENOUS
  Filled 2021-04-10 (×7): qty 250

## 2021-04-10 MED ORDER — NOREPINEPHRINE 4 MG/250ML-% IV SOLN
INTRAVENOUS | Status: AC
  Administered 2021-04-10: 10 ug/min via INTRAVENOUS
  Filled 2021-04-10: qty 250

## 2021-04-10 MED ORDER — DEXTROSE 5 % IV SOLN
12.5000 mg/kg/h | INTRAVENOUS | Status: AC
  Administered 2021-04-10: 12.5 mg/kg/h via INTRAVENOUS
  Filled 2021-04-10: qty 120

## 2021-04-10 MED ORDER — LEVETIRACETAM IN NACL 1500 MG/100ML IV SOLN
1500.0000 mg | Freq: Once | INTRAVENOUS | Status: AC
  Administered 2021-04-10: 1500 mg via INTRAVENOUS
  Filled 2021-04-10: qty 100

## 2021-04-10 MED ORDER — VANCOMYCIN HCL 750 MG/150ML IV SOLN
750.0000 mg | INTRAVENOUS | Status: DC
  Filled 2021-04-10: qty 150

## 2021-04-10 MED ORDER — HEPARIN SODIUM (PORCINE) 5000 UNIT/ML IJ SOLN
5000.0000 [IU] | Freq: Three times a day (TID) | INTRAMUSCULAR | Status: DC
  Administered 2021-04-10 – 2021-04-11 (×4): 5000 [IU] via SUBCUTANEOUS
  Filled 2021-04-10 (×4): qty 1

## 2021-04-10 MED ORDER — VITAMIN K1 10 MG/ML IJ SOLN
5.0000 mg | Freq: Once | INTRAVENOUS | Status: AC
  Administered 2021-04-10: 5 mg via INTRAVENOUS
  Filled 2021-04-10: qty 0.5

## 2021-04-10 MED ORDER — SODIUM CHLORIDE 0.9 % IV SOLN
750.0000 mg | Freq: Two times a day (BID) | INTRAVENOUS | Status: DC
  Filled 2021-04-10: qty 7.5

## 2021-04-10 MED ORDER — STROKE: EARLY STAGES OF RECOVERY BOOK
Freq: Once | Status: AC
  Filled 2021-04-10: qty 1

## 2021-04-10 MED ORDER — B COMPLEX-C PO TABS
1.0000 | ORAL_TABLET | Freq: Every day | ORAL | Status: DC
  Administered 2021-04-10 – 2021-04-14 (×5): 1
  Filled 2021-04-10 (×5): qty 1

## 2021-04-10 MED ORDER — HEPARIN SODIUM (PORCINE) 1000 UNIT/ML DIALYSIS
1000.0000 [IU] | INTRAMUSCULAR | Status: DC | PRN
Start: 2021-04-10 — End: 2021-04-15
  Administered 2021-04-11 – 2021-04-14 (×2): 4200 [IU] via INTRAVENOUS_CENTRAL
  Administered 2021-04-14: 4000 [IU] via INTRAVENOUS_CENTRAL
  Filled 2021-04-10: qty 6
  Filled 2021-04-10: qty 3

## 2021-04-10 MED ORDER — PRISMASOL BGK 4/2.5 32-4-2.5 MEQ/L REPLACEMENT SOLN
Status: DC
  Filled 2021-04-10 (×13): qty 5000

## 2021-04-10 MED ORDER — VANCOMYCIN HCL 1250 MG/250ML IV SOLN
1250.0000 mg | Freq: Once | INTRAVENOUS | Status: AC
  Administered 2021-04-10: 1250 mg via INTRAVENOUS
  Filled 2021-04-10: qty 250

## 2021-04-10 MED ORDER — INSULIN ASPART 100 UNIT/ML IJ SOLN
0.0000 [IU] | INTRAMUSCULAR | Status: DC
  Administered 2021-04-11: 2 [IU] via SUBCUTANEOUS
  Administered 2021-04-11: 1 [IU] via SUBCUTANEOUS
  Administered 2021-04-11: 3 [IU] via SUBCUTANEOUS

## 2021-04-10 MED ORDER — PRISMASOL BGK 4/2.5 32-4-2.5 MEQ/L EC SOLN
Status: DC
Start: 2021-04-10 — End: 2021-04-15
  Filled 2021-04-10 (×53): qty 5000

## 2021-04-10 MED ORDER — FAMOTIDINE 20 MG PO TABS
10.0000 mg | ORAL_TABLET | Freq: Every day | ORAL | Status: DC
  Administered 2021-04-10 – 2021-04-14 (×5): 10 mg
  Filled 2021-04-10 (×5): qty 1

## 2021-04-10 NOTE — Progress Notes (Signed)
ANTICOAGULATION CONSULT NOTE - Follow Up Consult  Pharmacy Consult for heparin Indication:  Afib/NSTEMI in setting of acute ischemic stroke with acute/subacute infarcts  Labs: Recent Labs    04/03/2021 0806 03/25/2021 0930 04/01/2021 1442 03/21/2021 1830 04/06/2021 2035 04/06/2021 0454 03/18/2021 1809 04/09/21 0248 04/09/21 0930 04/09/21 1450 04/10/21 0140  HGB 11.2*  --   --   --   --  9.6*  --  11.2* 10.5*  --   --   HCT 36.9  --   --   --   --  30.0*  --  34.5* 31.0*  --   --   PLT 328  --   --   --   --  256  --  297  --   --   --   LABPROT  --  15.2  --   --   --   --  21.9*  --   --  27.0*  --   INR  --  1.2  --   --   --   --  1.9*  --   --  2.5*  --   HEPARINUNFRC  --   --   --   --    < > 0.52  --  0.21*  --   --  <0.10*  CREATININE 8.12*  --   --   --   --  9.62*  --  5.43*  --  6.22*  --   TROPONINIHS 1,320* 1,605* 2,745* 5,564*  --  14,294*  --   --   --   --   --    < > = values in this interval not displayed.    Assessment: 65yo female subtherapeutic on heparin after resumed once [facility] revealed no bleeding; no infusion issues or signs of bleeding per RN.  Goal of Therapy:  Heparin level 0.3-0.5 units/ml   Plan:  Will increase heparin infusion conservatively to 1250 units/hr and check level in 8 hours.    Wynona Neat, PharmD, BCPS  04/10/2021,2:45 AM

## 2021-04-10 NOTE — Progress Notes (Signed)
Pharmacy Antibiotic Note  Amber Young is a 65 y.o. female admitted on 03/18/2021.  Pharmacy has been consulted for vancomycin and zosyn dosing. Patient is ESRD on HD - now currently on CRRT.  Of note, antibiotics ordered after confirmation of blood cultures obtained.   Plan: Vancomycin '1250mg'$  x1 loading dose followed by vancomycin '750mg'$  IV q24h (~'10mg'$ /kg) Zosyn 3.375g IV q6h  Monitor blood cultures, CRRT tolerability, and clinical progression  Height: '5\' 7"'$  (170.2 cm) Weight: 68.7 kg (151 lb 7.3 oz) IBW/kg (Calculated) : 61.6  Temp (24hrs), Avg:96.6 F (35.9 C), Min:91.5 F (33.1 C), Max:98.5 F (36.9 C)  Recent Labs  Lab 03/19/2021 0806 03/18/2021 0930 03/23/2021 1442 03/12/2021 0454 04/09/21 0248 04/09/21 1450 04/10/21 0500 04/10/21 0755  WBC 8.3  --   --  8.5 12.2*  --  18.3*  --   CREATININE 8.12*  --   --  9.62* 5.43* 6.22* 4.72*  --   LATICACIDVEN  --  3.1* 2.9*  --   --   --   --  3.5*    Estimated Creatinine Clearance: 11.6 mL/min (A) (by C-G formula based on SCr of 4.72 mg/dL (H)).    Allergies  Allergen Reactions   Ace Inhibitors Cough    Antimicrobials this admission: Vancomycin 9/1 >>  Zosyn 9/1 >>  Dose adjustments this admission: N/a  Microbiology results: 8/28 bcx: ngtd  9/1 bcx: collected  Thank you for allowing pharmacy to be a part of this patient's care.  Cristela Felt, PharmD, BCPS Clinical Pharmacist 04/10/2021 3:07 PM

## 2021-04-10 NOTE — Progress Notes (Addendum)
PCCM Interval Progress Note  Appreciate IR help placing central line in setting of her central venous stenosis. GI now following for acute liver failure. Cultured and started on vancomycin and zosyn.   Assessment: # Acute liver failure: ?shock liver but never on pressors, ?infarction although US doppler wnl # Relative hypotension: legs are lukewarm. probably multifactorial - heart failure, liver failure, still a little lower suspicion for sepsis. Her bedside LVOT VTI was only 7.3cm. Unfortunately co-ox may be a little artificially high in setting of her LUE fistula. # Lactic acidosis: to large extent likely from poor clearance  # NSTEMI # ?seizure activity # Embolic CVAs   Plan: - can trend co-ox but does have LUE fistula and apparently venous blood on this side looks pretty bright compared to blood drawn from RUE - check vbg - ok with vitamin K, check TEG prior to restarting heparin gtt   - keppra  - NAC  - f/u ammonia, if elevated although evidence is limited for its use in setting of ALF would still probably start lactulose given her profound encephalopathy and heightened susceptibility to seizure - tentative 48h trial of vanc/zosyn, follow cultures and clinical response  This patient is critically ill with multiple organ system failure; which, requires frequent high complexity decision making, assessment, support, evaluation, and titration of therapies. This was completed through the application of advanced monitoring technologies and extensive interpretation of multiple databases. During this encounter critical care time was devoted to patient care services described in this note for 36 minutes.  [place]

## 2021-04-10 NOTE — Progress Notes (Signed)
Assisted tele visit to patient with family member.  Brent Noto R, RN  

## 2021-04-10 NOTE — Progress Notes (Signed)
K 3.3 on PM CRRT labs Changed to all 4K bath  Madelon Lips MD  [facility] Pgr [phone]. [phone]

## 2021-04-10 NOTE — Consult Note (Addendum)
Referring Provider:  PCCM Primary Care Physician:  Pcp, No Primary Gastroenterologist:  Dr. Loletha Carrow  Reason for Consultation:  Elevated LFTs, concern for acute liver failure  HPI: Amber Young is a 65 y.o. female with history of ESRD on hemodialysis, CAD/PCI's (LAD in 2017/RCA in 2019), paroxysmal atrial fibrillation on Coumadin, systolic heart failure with EF of 25-30%, hypertension, stroke who was admitted on 03/25/2021 with lethargy and confusion and found to have elevated troponin concerning for non-STEMI.  Was going to have a cath and then had mental status change.  Course complicated by acute stroke and deterioration in mental status on 04/09/2021 requiring intubation and transfer to the ICU.  There is concern for some seizure activity as well.  GI is being asked to evaluate because of significantly elevated/rapidly increasing LFTs and elevated INR, concern for acute liver failure.  AST is 9649, ALT 2575, ALP 119, total bili 2.2.  INR is 3.1.  Acute viral hepatitis panel is negative.  LFTs were normal 7 months ago.    Past Medical History:  Diagnosis Date   Anemia    Atrial fibrillation ([place]) 06/17/2019   Cataract, bilateral    Chronic combined systolic and diastolic CHF (congestive heart failure) ([place])    a. EF 40-45% in 2019 b. EF 25-30% by repeat echo in 01/2019   CKD (chronic kidney disease), stage IV (HCC)    Coronary artery disease 08/2017   DES x 2 RCA   Dialysis patient ([place]) 07/2019   DKA (diabetic ketoacidoses) 06/16/2019   GERD (gastroesophageal reflux disease)    Glaucoma, left eye    Heart murmur    Hyperlipidemia    Hypertension    Proliferative diabetic retinopathy ([place])    left eye with vitreous hemorrhage and tractional retinal detachment   Stroke ([place]) 09/2014   numbness left upper lip, finger tips on left hand; "resolved" (05/18/2016)   Type II diabetes mellitus ([place])    Wears glasses     Past Surgical History:  Procedure Laterality Date   AV FISTULA  PLACEMENT Left 07/10/2019   Procedure: ARTERIOVENOUS (AV) FISTULA CREATION;  Surgeon: Angelia Mould, MD;  Location: [facility];  Service: Vascular;  Laterality: Left;   AV FISTULA PLACEMENT Left 04/03/2021   Procedure: INSERTION OF LEFT UPPER ARM ARTERIOVENOUS (AV) GORE-TEX GRAFT;  Surgeon: Serafina Mitchell, MD;  Location: [facility];  Service: Vascular;  Laterality: Left;   CARDIAC CATHETERIZATION N/A 05/18/2016   Procedure: Left Heart Cath and Coronary Angiography;  Surgeon: Jettie Booze, MD;  Location: [facility];  Service: Cardiovascular;  Laterality: N/A;   CARDIAC CATHETERIZATION N/A 05/18/2016   Procedure: Coronary Stent Intervention;  Surgeon: Jettie Booze, MD;  Location: [facility];  Service: Cardiovascular;  Laterality: N/A;   CATARACT EXTRACTION Right 2020   COLONOSCOPY W/ BIOPSIES AND POLYPECTOMY     CORONARY ANGIOPLASTY     CORONARY ANGIOPLASTY WITH STENT PLACEMENT     CORONARY STENT INTERVENTION N/A 09/07/2017   Procedure: CORONARY STENT INTERVENTION;  Surgeon: Leonie Man, MD;  Location: [facility];  Service: Cardiovascular;  Laterality: N/A;   EYE SURGERY     GAS INSERTION Left 01/13/2018   Procedure: INSERTION OF GAS;  Surgeon: Bernarda Caffey, MD;  Location: [facility];  Service: Ophthalmology;  Laterality: Left;   INSERTION OF DIALYSIS CATHETER Right 07/10/2019   Procedure: INSERTION OF TUNNELED DIALYSIS CATHETER;  Surgeon: Angelia Mould, MD;  Location: [facility];  Service: Vascular;  Laterality: Right;  IR FLUORO GUIDE CV LINE RIGHT  07/05/2019   IR US GUIDE VASC ACCESS RIGHT  07/05/2019   LEFT HEART CATH AND CORONARY ANGIOGRAPHY N/A 09/07/2017   Procedure: LEFT HEART CATH AND CORONARY ANGIOGRAPHY;  Surgeon: Leonie Man, MD;  Location: [facility];  Service: Cardiovascular;  Laterality: N/A;   LEFT HEART CATH AND CORONARY ANGIOGRAPHY N/A 03/18/2021   Procedure: LEFT HEART CATH AND CORONARY ANGIOGRAPHY;  Surgeon: Belva Crome,  MD;  Location: [facility];  Service: Cardiovascular;  Laterality: N/A;  Canceled   MEMBRANE PEEL Left 01/13/2018   Procedure: MEMBRANE PEEL LEFT EYE ;  Surgeon: Bernarda Caffey, MD;  Location: [facility];  Service: Ophthalmology;  Laterality: Left;   MULTIPLE TOOTH EXTRACTIONS     PARS PLANA VITRECTOMY Left 01/13/2018   Procedure: PARS PLANA VITRECTOMY WITH 25 GAUGE LEFT EYE WITH ENDOLASER;  Surgeon: Bernarda Caffey, MD;  Location: [facility];  Service: Ophthalmology;  Laterality: Left;   [place]   "partial; fibroids"    Prior to Admission medications   Medication Sig Start Date End Date Taking? Authorizing Provider  oxyCODONE-acetaminophen (PERCOCET) 5-325 MG tablet Take 1 tablet by mouth every 6 (six) hours as needed for severe pain. 04/03/21 04/03/22  Dagoberto Ligas, PA-C  acetaminophen (TYLENOL) 500 MG tablet Take 2 tablets (1,000 mg total) by mouth every 6 (six) hours as needed for mild pain or headache. 07/10/19   Azzie Glatter, FNP  ALPRAZolam Duanne Moron) 0.25 MG tablet Take 1 tablet (0.25 mg total) by mouth 2 (two) times daily as needed for anxiety. Patient not taking: No sig reported 07/13/19   Swayze, Ava, DO  amLODipine (NORVASC) 10 MG tablet TAKE 1 TABLET (10 MG TOTAL) BY MOUTH DAILY. 08/09/19   Azzie Glatter, FNP  aspirin EC 81 MG tablet Take 81 mg by mouth daily.    [provider]  atorvastatin (LIPITOR) 40 MG tablet TAKE 1 TABLET BY MOUTH DAILY AT 6PM Patient taking differently: Take 40 mg by mouth daily. 07/23/20 07/23/21  Azzie Glatter, FNP  carvedilol (COREG) 25 MG tablet TAKE 1 TABLET BY MOUTH TWO TIMES DAILY Patient taking differently: Take 25 mg by mouth 2 (two) times daily. 07/23/20 07/23/21  Azzie Glatter, FNP  feeding supplement, ENSURE ENLIVE, (ENSURE ENLIVE) LIQD Take 237 mLs by mouth 2 (two) times daily between meals. Patient not taking: No sig reported 07/14/19   Swayze, Ava, DO  ferric citrate (AURYXIA) 1 GM 210  MG(Fe) tablet Take 210 mg by mouth 3 (three) times daily with meals. 09/01/19   [provider]  ferrous sulfate (FERROUSUL) 325 (65 FE) MG tablet Take 1 tablet (325 mg total) by mouth every other day. Patient taking differently: Take 325 mg by mouth daily. 06/27/19   Azzie Glatter, FNP  glipiZIDE (GLUCOTROL) 10 MG tablet TAKE 1 TABLET BY MOUTH TWO TIMES DAILY BEFORE MEALS Patient taking differently: Take 10 mg by mouth 2 (two) times daily before a meal. 07/23/20 07/23/21  Azzie Glatter, FNP  hydrALAZINE (APRESOLINE) 50 MG tablet TAKE 1 TABLET (50 MG TOTAL) BY MOUTH 2 (TWO) TIMES DAILY. PATIENT NEEDS APPOINTMENT FOR ANY FUTURE REFILLS. Patient taking differently: Take 50 mg by mouth in the morning and at bedtime. 10/21/20 10/21/21  Skeet Latch, MD  Insulin Glargine (LANTUS) 100 UNIT/ML Solostar Pen Inject 4 Units into the skin daily. Patient taking differently: Inject 4 Units into the skin daily as needed. In  morning if needed 06/27/19   Azzie Glatter, FNP  Insulin Pen Needle (TRUEPLUS PEN NEEDLES) 31G X 6 MM MISC USE AS DIRECTED 06/27/19   Azzie Glatter, FNP  isosorbide mononitrate (IMDUR) 30 MG 24 hr tablet Take 1 tablet (30 mg total) by mouth daily. 07/14/19   Swayze, Ava, DO  multivitamin (RENA-VIT) TABS tablet Take 1 tablet by mouth at bedtime. 07/13/19   Swayze, Ava, DO  nitroGLYCERIN (NITROSTAT) 0.4 MG SL tablet Place 1 tablet (0.4 mg total) under the tongue every 5 (five) minutes x 3 doses as needed for chest pain. 06/27/19   Azzie Glatter, FNP  ondansetron (ZOFRAN ODT) 4 MG disintegrating tablet Take 1 tablet (4 mg total) by mouth every 8 (eight) hours as needed for nausea or vomiting. Patient not taking: No sig reported 09/07/20   Noemi Chapel, MD  ondansetron (ZOFRAN-ODT) 4 MG disintegrating tablet TAKE 1 TABLET (4 MG TOTAL) BY MOUTH EVERY 8 (EIGHT) HOURS AS NEEDED FOR NAUSEA OR VOMITING. Patient not taking: No sig reported 09/07/20 09/07/21  Domenic Moras, PA-C   warfarin (COUMADIN) 5 MG tablet TAKE 1 TABLET BY MOUTH 4 TIMES A WEEK ON TUESDAY, THURSDAY, SATURDAY, AND SUNDAY. (TAKE 7.5MG ON MONDAY, WEDNESDAY, AND FRIDAY.) Patient taking differently: Take 5-7.5 mg by mouth See admin instructions. Mon Wed Fri, 7.5 mg, all other days 5 mg 09/03/20 09/03/21  Azzie Glatter, FNP    Current Facility-Administered Medications  Medication Dose Route Frequency Provider Last Rate Last Admin    stroke: mapping our early stages of recovery book   Does not apply Once Maryjane Hurter, MD       alteplase (CATHFLO ACTIVASE) injection 2 mg  2 mg Intracatheter Once PRN Frederik Pear, MD   2 mg at 04/09/21 2200   B-complex with vitamin C tablet 1 tablet  1 tablet Per Tube Daily Maryjane Hurter, MD       chlorhexidine gluconate (MEDLINE KIT) (PERIDEX) 0.12 % solution 15 mL  15 mL Mouth Rinse BID Collene Gobble, MD   15 mL at 04/10/21 0724   Chlorhexidine Gluconate Cloth 2 % PADS 6 each  6 each Topical Daily Kristopher Oppenheim, DO   6 each at 04/10/21 1012   docusate (COLACE) 50 MG/5ML liquid 100 mg  100 mg Per Tube BID Minor, Grace Bushy, NP   100 mg at 04/09/21 2248   famotidine (PEPCID) tablet 10 mg  10 mg Per Tube Daily Maryjane Hurter, MD       feeding supplement (PROSource TF) liquid 45 mL  45 mL Per Tube TID Maryjane Hurter, MD   45 mL at 04/09/21 2249   feeding supplement (VITAL 1.5 CAL) liquid 1,000 mL  1,000 mL Per Tube Continuous Maryjane Hurter, MD 40 mL/hr at 04/10/21 0800 1,000 mL at 04/10/21 0800   fentaNYL (SUBLIMAZE) injection 50 mcg  50 mcg Intravenous Q15 min PRN Minor, Grace Bushy, NP       fentaNYL (SUBLIMAZE) injection 50-200 mcg  50-200 mcg Intravenous Q30 min PRN Minor, Grace Bushy, NP       heparin injection 1,000-6,000 Units  1,000-6,000 Units CRRT PRN Edrick Oh, MD       heparin injection 5,000 Units  5,000 Units Subcutaneous Q8H Maryjane Hurter, MD       insulin aspart (novoLOG) injection 0-5 Units  0-5 Units Subcutaneous QHS Kristopher Oppenheim, DO       insulin aspart (novoLOG) injection 0-6 Units  0-6 Units  Subcutaneous TID WC Kristopher Oppenheim, DO       levETIRAcetam (KEPPRA) IVPB 1500 mg/ 100 mL premix  1,500 mg Intravenous Once Dennison Mascot, PA-C       Followed by   levETIRAcetam (KEPPRA) 750 mg in sodium chloride 0.9 % 100 mL IVPB  750 mg Intravenous Q12H Dennison Mascot, PA-C       MEDLINE mouth rinse  15 mL Mouth Rinse 10 times per day Collene Gobble, MD   15 mL at 04/10/21 1041   midazolam (VERSED) injection 2 mg  2 mg Intravenous Q15 min PRN Minor, Grace Bushy, NP       midazolam (VERSED) injection 2 mg  2 mg Intravenous Q2H PRN Minor, Grace Bushy, NP       polyethylene glycol (MIRALAX / GLYCOLAX) packet 17 g  17 g Per Tube Daily Minor, Grace Bushy, NP       prismasol BGK 2/2.5 dialysis solution   CRRT Continuous Edrick Oh, MD 2,000 mL/hr at 04/10/21 1012 New Bag at 04/10/21 1012   prismasol BGK 2/2.5 replacement solution   CRRT Continuous Edrick Oh, MD 500 mL/hr at 04/10/21 0725 New Bag at 04/10/21 0725   prismasol BGK 2/2.5 replacement solution   CRRT Continuous Edrick Oh, MD 500 mL/hr at 04/10/21 1041 New Bag at 04/10/21 1041   sodium chloride 0.9 % primer fluid for CRRT   CRRT PRN Edrick Oh, MD       sodium chloride flush (NS) 0.9 % injection 3 mL  3 mL Intravenous Q12H Bhagat, Bhavinkumar, PA   3 mL at 04/05/2021 2333    Allergies as of 03/30/2021 - Review Complete 03/30/2021  Allergen Reaction Noted   Ace inhibitors Cough 12/11/2015    Family History  Problem Relation Age of Onset   Diabetes Mother    Hypertension Mother    COPD Mother    Heart failure Mother    Hypertension Sister    Hypertension Brother     Social History   Socioeconomic History   Marital status: Married    Spouse name: Not on file   Number of children: Not on file   Years of education: 16   Highest education level: Not on file  Occupational History   Occupation: hairdresser  Tobacco Use   Smoking status: Never    Smokeless tobacco: Never  Vaping Use   Vaping Use: Never used  Substance and Sexual Activity   Alcohol use: No    Alcohol/week: 0.0 standard drinks   Drug use: No   Sexual activity: Yes  Other Topics Concern   Not on file  Social History Narrative   Married, 2 children, hairdresser   Caffeine use- occasional tea or coffee   Right handed   Social Determinants of Health   Financial Resource Strain: Not on file  Food Insecurity: Not on file  Transportation Needs: Not on file  Physical Activity: Not on file  Stress: Not on file  Social Connections: Not on file  Intimate Partner Violence: Not on file    Review of Systems: ROS is O/W negative except as mentioned in HPI.  Physical Exam: Vital signs in last 24 hours: Temp:  [97.5 F (36.4 C)-98.5 F (36.9 C)] 97.5 F (36.4 C) (09/01 0700) Pulse Rate:  [44-123] 90 (09/01 1045) Resp:  [16-19] 19 (09/01 1045) BP: (73-149)/(47-71) 79/57 (09/01 1045) SpO2:  [94 %-100 %] 100 % (09/01 1045) FiO2 (%):  [40 %] 40 % (09/01 0818) Weight:  [68.7 kg] 68.7  kg (09/01 0500) Last BM Date:  (Pt unable to state) General:  Intubated, sedated.  Head:  Normocephalic and atraumatic. Eyes:  Sclera clear, no icterus.  Conjunctiva pink. Ears:  Normal auditory acuity. Mouth:  No deformity or lesions.   Lungs:  Mechanical breath sounds noted. Heart:  Irregularly irregular. Abdomen:  Soft, non-distended.  BS present.  Non-tender.  Msk:  Symmetrical without gross deformities. Pulses:  Normal pulses noted. Extremities:  Without clubbing or edema. Neurologic:  Intubated, sedated. Skin:  Intact without significant lesions or rashes.  Intake/Output from previous day: 08/31 0701 - 09/01 0700 In: 538.2 [I.V.:198.2; NG/GT:340] Out: 866  Intake/Output this shift: Total I/O In: 141.3 [I.V.:31.3; NG/GT:110] Out: 208 [Other:207; Stool:1]  Lab Results: Recent Labs    03/13/2021 0454 04/09/21 0248 04/09/21 0930 04/10/21 0500  WBC 8.5 12.2*  --   18.3*  HGB 9.6* 11.2* 10.5* 10.0*  HCT 30.0* 34.5* 31.0* 29.9*  PLT 256 297  --  246   BMET Recent Labs    04/09/21 0248 04/09/21 0930 04/09/21 1450 04/10/21 0500  NA 137 132* 136 137  K 5.5* 6.0* 6.0* 4.0  CL 98  --  95* 100  CO2 17*  --  17* 21*  GLUCOSE 105*  --  158* 99  BUN 34*  --  47* 39*  CREATININE 5.43*  --  6.22* 4.72*  CALCIUM 9.0  --  9.2 8.8*   LFT Recent Labs    04/10/21 0500  PROT 6.2*  ALBUMIN 3.0*  2.9*  AST 9,649*  ALT 2,575*  ALKPHOS 119  BILITOT 2.2*  BILIDIR 0.6*  IBILI 1.6*   PT/INR Recent Labs    04/09/21 1450 04/10/21 0536  LABPROT 27.0* 31.5*  INR 2.5* 3.1*   Hepatitis Panel Recent Labs    03/23/2021 1552  HEPBSAG NON REACTIVE  HCVAB NON REACTIVE  HEPAIGM NON REACTIVE  HEPBIGM NON REACTIVE    Studies/Results: CT HEAD WO CONTRAST (5MM)  Result Date: 04/09/2021 CLINICAL DATA:  Mental status change, unknown cause. Intubated and unresponsive. EXAM: CT HEAD WITHOUT CONTRAST TECHNIQUE: Contiguous axial images were obtained from the base of the skull through the vertex without intravenous contrast. COMPARISON:  Prior head CT examinations 03/22/2021 and earlier. Brain MRI 06/17/2019. FINDINGS: Brain: Mild generalized cerebral and cerebellar atrophy. There are small infarcts within the bilateral cerebellar hemispheres which are new as compared to the brain MRI of 06/17/2019, but otherwise age-indeterminate. Redemonstrated chronic lacunar infarcts versus prominent perivascular spaces within the bilateral basal ganglia (series 2, image 16). In retrospect, these infarcts were present on the head CT head 04/06/2021. Redemonstrated chronic lacunar infarct within the right thalamus. Moderate patchy and ill-defined hypoattenuation within the cerebral white matter, nonspecific but compatible with chronic small vessel ischemic disease. There is no acute intracranial hemorrhage. No demarcated cortical infarct. No extra-axial fluid collection. No evidence  of an intracranial mass. No midline shift. Vascular: No hyperdense vessel.  Atherosclerotic calcifications. Skull: Normal. Negative for fracture or focal lesion. Sinuses/Orbits: Visualized orbits show no acute finding. No significant paranasal sinus disease at the imaged levels. IMPRESSION: Small infarcts within the bilateral cerebellar hemispheres are new as compared to the brain MRI of 06/17/2019, but otherwise age-indeterminate. Consider a brain MRI for further evaluation. Redemonstrated chronic lacunar infarcts versus prominent perivascular spaces within bilateral basal ganglia. Redemonstrated chronic lacunar infarct within the right thalamus. Moderate chronic small vessel ischemic changes within cerebral white matter. Mild generalized parenchymal atrophy. Electronically Signed   By: Kellie Simmering D.O.  On: 04/09/2021 08:50   MR BRAIN WO CONTRAST  Result Date: 04/09/2021 CLINICAL DATA:  Mental status change, unknown cause. EXAM: MRI HEAD WITHOUT CONTRAST TECHNIQUE: Multiplanar, multiecho pulse sequences of the brain and surrounding structures were obtained without intravenous contrast. COMPARISON:  Head CT April 09, 2021 FINDINGS: The study is partially degraded by motion. Brain: A 9 mm focus of restricted diffusion is seen in the left cerebellar hemisphere. Scattered foci of restricted diffusion are also seen within the deep white matter of the left frontal lobe, as well as within the medial left frontal parietal cortex. No hemorrhage, hydrocephalus, extra-axial collection or mass lesion. Remote lacunar infarcts in the bilateral cerebellar hemispheres and right thalamus. Scattered and confluent foci of T2 hyperintensity are seen within the white matter of the cerebral hemispheres, nonspecific, most likely related to chronic small vessel ischemia. Moderate parenchymal volume loss. Focus of susceptibility artifact in the left amygdala suggesting hemosiderin deposit. Vascular: Normal flow voids. Skull and  upper cervical spine: Normal marrow signal. Sinuses/Orbits: Negative. Other: None. IMPRESSION: 1. Multiple small acute/subacute infarcts in the left ACA and left PICA territories, suggesting embolic events. 2. Moderate chronic microvascular ischemic changes of the white matter and parenchymal. Electronically Signed   By: Pedro Earls M.D.   On: 04/09/2021 17:04   CARDIAC CATHETERIZATION  Result Date: 03/23/2021 Procedure was canceled at the patient's request   DG Chest Port 1 View  Result Date: 04/10/2021 CLINICAL DATA:  Abnormal respiration EXAM: PORTABLE CHEST 1 VIEW COMPARISON:  Chest x-ray dated April 09, 2021 FINDINGS: Slight interval retraction of ET tube with tip positioned approximately 3.5 cm from the carina. Unchanged position of left central venous line. Enteric feeding tube partially seen coursing below the diaphragm. Cardiac and mediastinal contours are unchanged. Lungs are clear. No large pleural effusion or evidence of pneumothorax. IMPRESSION: Lungs are clear. Electronically Signed   By: Yetta Glassman M.D.   On: 04/10/2021 10:46   DG CHEST PORT 1 VIEW  Result Date: 04/09/2021 CLINICAL DATA:  History of heart failure, intubated EXAM: PORTABLE CHEST 1 VIEW COMPARISON:  Chest radiograph 03/22/2021 FINDINGS: The endotracheal tube terminates approximately 1.7 cm from the carina. The left dialysis catheter terminates in the right atrium. Defibrillator pads and additional leads overlie the patient. The cardiomediastinal silhouette is stable, with unchanged cardiomegaly. There is no focal consolidation or pulmonary edema. There is no pleural effusion or pneumothorax. The bones are stable. IMPRESSION: 1. Endotracheal tube terminates approximately 1.7 cm from the carina. 2. Unchanged cardiomegaly. 3. No radiographic evidence of acute cardiopulmonary process. Electronically Signed   By: Valetta Mole M.D.   On: 04/09/2021 10:31   DG Abd Portable 1V  Result Date:  04/09/2021 CLINICAL DATA:  Feeding tube placement EXAM: PORTABLE ABDOMEN - 1 VIEW COMPARISON:  CT scan 03/30/2021 FINDINGS: Feeding tube extends into the stomach and terminates in the gastric body. There is some residual contrast medium in the transverse colon. Extensive atherosclerotic vascular calcifications noted. IMPRESSION: 1. The feeding tube tip is in the stomach body. 2. Atherosclerosis. Electronically Signed   By: Van Clines M.D.   On: 04/09/2021 18:23   EEG adult  Result Date: 04/09/2021 Lora Havens, MD     04/09/2021 11:32 AM Patient Name: Amber Young MRN: [id number] Epilepsy Attending: Lora Havens Referring Physician/Provider: Dr Su Monks Date: 04/09/2021 Duration: 23.17 mins Patient history: 65yo F with altered mental status with questionable left arm seizure activity. EEG to evaluate for seizure Level of alertness: Awake AEDs  during EEG study: None Technical aspects: This EEG study was done with scalp electrodes positioned according to the 10-20 International system of electrode placement. Electrical activity was acquired at a sampling rate of _0  and reviewed with a high frequency filter of _1  and a low frequency filter of _2 . EEG data were recorded continuously and digitally stored. Description: EEG showed continuous generalized 3 to 6 Hz theta-delta slowing, at times with triphasic morphology.  Hyperventilation and photic stimulation were not performed.   ABNORMALITY - Continuous slow, generalized IMPRESSION: This study is suggestive of moderate diffuse encephalopathy, nonspecific etiology. No seizures or epileptiform discharges were seen throughout the recording. Priyanka [name]   Overnight EEG with video  Result Date: 04/10/2021 Lora Havens, MD     04/10/2021  9:40 AM Patient Name: Amber Young MRN: [id number] Epilepsy Attending: Lora Havens Referring Physician/Provider: Payton Doughty, NP Duration: 04/09/2021 1925 to 04/10/2021 0930  Patient history:  65yo F with altered mental status with questionable left arm seizure activity. EEG to evaluate for seizure  Level of alertness: Awake, asleep  AEDs during EEG study: None  Technical aspects: This EEG study was done with scalp electrodes positioned according to the 10-20 International system of electrode placement. Electrical activity was acquired at a sampling rate of _3  and reviewed with a high frequency filter of _4  and a low frequency filter of _5 . EEG data were recorded continuously and digitally stored.  Description: EEG showed continuous generalized 3 to 6 Hz theta-delta slowing.  Generalized periodic discharges with triphasic morphology at 1 to 1.5 Hz were also noted, predominantly when patient was awake/stimulated.  Hyperventilation and photic stimulation were not performed.    ABNORMALITY - Continuous slow, generalized - Periodic discharges with triphasic morphology, generalized  IMPRESSION: This study showed generalized periodic discharges with triphasic morphology which is on the ictal-interictal continuum.  However, the morphology, frequency and reactivity to stimulation is more commonly seen due to toxic-metabolic causes.  There is also moderate diffuse encephalopathy, nonspecific etiology. No seizures were seen throughout the recording.  Lora Havens   ECHOCARDIOGRAM COMPLETE  Result Date: 04/09/2021    ECHOCARDIOGRAM REPORT   Patient Name:   Amber Young Date of Exam: 04/09/2021 Medical Rec #:  [id number]        Height:       67.0 in Accession #:    [id number]       Weight:       155.9 lb Date of Birth:  [birth year]        BSA:          1.819 m Patient Age:    18 years         BP:           103/67 mmHg Patient Gender: F                HR:           49 bpm. Exam Location:  Inpatient Procedure: 2D Echo, Cardiac Doppler, Color Doppler and Intracardiac            Opacification Agent Indications:    122-I22.9 Subsequent ST elevation (STEM) and non-ST elevation                 (NSTEMI)  myocardial infarction  History:        Patient has prior history of Echocardiogram examinations, most                 recent 07/01/2019. Cardiomyopathy, Previous Myocardial  Infarction, Acute MI and CAD, Abnormal ECG, Stroke,                 Arrythmias:Atrial Fibrillation,                 Signs/Symptoms:Dizziness/Lightheadedness and Dyspnea; Risk                 Factors:Hypertension. ESRD.  Sonographer:    Roseanna Rainbow RDCS Referring Phys: [id number] [place]  1. Left ventricular ejection fraction, by estimation, is 25 to 30%. The left ventricle has severely decreased function. The left ventricle demonstrates global hypokinesis. There is moderate left ventricular hypertrophy. Left ventricular diastolic function could not be evaluated. Elevated left ventricular end-diastolic pressure. There is akinesis of the left ventricular, mid-apical septal wall.  2. Right ventricular systolic function is mildly reduced. The right ventricular size is normal.  3. Left atrial size was moderately dilated.  4. Right atrial size was mildly dilated.  5. The mitral valve is abnormal. Mild mitral valve regurgitation. No evidence of mitral stenosis. Moderate to severe mitral annular calcification.  6. The aortic valve is grossly normal. There is mild calcification of the aortic valve. There is mild thickening of the aortic valve. Aortic valve regurgitation is not visualized. Mild to moderate aortic valve sclerosis/calcification is present, without  any evidence of aortic stenosis. Comparison(s): Prior images reviewed side by side. Changes from prior study are noted. Conclusion(s)/Recommendation(s): Rhythm changed between sinus bradycardia and rate controlled atrial fibrillation during study. Overall, compared to prior the pattern of wall motion abnormalities is similar, but they are more pronounced on current study,  with slight decrease in EF from prior. Findings communicated with NP Reino Bellis.  FINDINGS  Left Ventricle: Global hypokinesis with focal akinesis of the mid to distal anterior and inferior septum. Left ventricular ejection fraction, by estimation, is 25 to 30%. The left ventricle has severely decreased function. The left ventricle demonstrates global hypokinesis. The left ventricular internal cavity size was normal in size. There is moderate left ventricular hypertrophy. Left ventricular diastolic function could not be evaluated due to atrial fibrillation. Left ventricular diastolic function could not be evaluated. Elevated left ventricular end-diastolic pressure. Right Ventricle: The right ventricular size is normal. Right vetricular wall thickness was not well visualized. Right ventricular systolic function is mildly reduced. Left [facility]: Left atrial size was moderately dilated. Right [facility]: Right atrial size was mildly dilated. Pericardium: There is no evidence of pericardial effusion. Mitral Valve: The mitral valve is abnormal. There is mild thickening of the mitral valve leaflet(s). There is mild calcification of the mitral valve leaflet(s). Moderate to severe mitral annular calcification. Mild mitral valve regurgitation. No evidence  of mitral valve stenosis. Tricuspid Valve: The tricuspid valve is normal in structure. Tricuspid valve regurgitation is trivial. No evidence of tricuspid stenosis. Aortic Valve: The aortic valve is grossly normal. There is mild calcification of the aortic valve. There is mild thickening of the aortic valve. There is moderate aortic valve annular calcification. Aortic valve regurgitation is not visualized. Mild to moderate aortic valve sclerosis/calcification is present, without any evidence of aortic stenosis. Pulmonic Valve: The pulmonic valve was not well visualized. Pulmonic valve regurgitation is not visualized. No evidence of pulmonic stenosis. Aorta: The aortic root, ascending aorta and aortic arch are all structurally normal, with no evidence of  dilitation or obstruction. Venous: IVC assessment for right atrial pressure unable to be performed due to mechanical ventilation. IAS/Shunts: The interatrial septum appears to be lipomatous. The atrial septum  is grossly normal.  LEFT VENTRICLE PLAX 2D LVIDd:         4.80 cm      Diastology LVIDs:         4.57 cm      LV e' medial:    2.13 cm/s LV PW:         1.80 cm      LV E/e' medial:  46.1 LV IVS:        1.20 cm      LV e' lateral:   2.13 cm/s LVOT diam:     1.90 cm      LV E/e' lateral: 46.1 LV SV:         40 LV SV Index:   22 LVOT Area:     2.84 cm  LV Volumes (MOD) LV vol d, MOD A2C: 97.9 ml LV vol d, MOD A4C: 115.3 ml LV vol s, MOD A2C: 65.4 ml LV vol s, MOD A4C: 69.9 ml LV SV MOD A2C:     32.5 ml LV SV MOD A4C:     115.3 ml LV SV MOD BP:      39.0 ml RIGHT VENTRICLE            IVC RV S prime:     5.70 cm/s  IVC diam: 1.90 cm TAPSE (M-mode): 1.5 cm LEFT ATRIUM             Index       RIGHT ATRIUM           Index LA diam:        3.40 cm 1.87 cm/m  RA Area:     15.70 cm LA Vol (A2C):   69.9 ml 38.43 ml/m RA Volume:   44.20 ml  24.30 ml/m LA Vol (A4C):   44.6 ml 24.52 ml/m LA Biplane Vol: 56.4 ml 31.01 ml/m  AORTIC VALVE LVOT Vmax:   70.70 cm/s LVOT Vmean:  39.200 cm/s LVOT VTI:    0.140 m  AORTA Ao Root diam: 3.10 cm Ao Asc diam:  3.00 cm MITRAL VALVE MV Area (PHT): 3.31 cm    SHUNTS MV Decel Time: 229 msec    Systemic VTI:  0.14 m MV E velocity: 98.10 cm/s  Systemic Diam: 1.90 cm Buford Dresser MD Electronically signed by Buford Dresser MD Signature Date/Time: 04/09/2021/6:24:41 PM    Final    US LIVER DOPPLER  Result Date: 04/10/2021 CLINICAL DATA:  Transaminitis EXAM: DUPLEX ULTRASOUND OF LIVER TECHNIQUE: Color and duplex Doppler ultrasound was performed to evaluate the hepatic in-flow and out-flow vessels. COMPARISON:  CT 03/25/2021 FINDINGS: Liver: Normal parenchymal echogenicity. Normal hepatic contour without nodularity. No focal lesion, mass or intrahepatic biliary ductal  dilatation. Main Portal Vein size: 0.9 cm Portal Vein Velocities (all hepatopetal): Main Prox:  20 cm/sec Main Mid: 14 cm/sec Main Dist:  11 cm/sec Right: 9 cm/sec Left: 9 cm/sec Hepatic Vein Velocities (all hepatofugal): Right:  9 cm/sec Middle:  10 cm/sec Left:  15 cm/sec IVC: Present and patent with normal respiratory phasicity. Hepatic Artery Velocity:  106 cm/sec Splenic Vein Velocity:  12 cm/sec Spleen: 8.8 cm x 3.9 cm x 3.5 cm with a total volume of 62 cm^3 (411 cm^3 is upper limit normal) Portal Vein Occlusion/Thrombus: No Splenic Vein Occlusion/Thrombus: No Ascites: None Varices: None IMPRESSION: Unremarkable hepatic vascular Doppler evaluation. Electronically Signed   By: Lucrezia Europe M.D.   On: 04/10/2021 08:12   US ABDOMEN LIMITED RUQ (LIVER/GB)  Result Date: 04/05/2021 CLINICAL DATA:  Elevated liver function tests. EXAM: ULTRASOUND ABDOMEN LIMITED RIGHT UPPER QUADRANT COMPARISON:  April 07, 2021. FINDINGS: Gallbladder: Sludge and cholelithiasis is noted. No gallbladder wall thickening or pericholecystic fluid is noted. No sonographic Murphy's sign is noted. Common bile duct: Diameter: 4 mm which is within normal limits. Liver: No focal lesion identified. Within normal limits in parenchymal echogenicity. Portal vein is patent on color Doppler imaging with normal direction of blood flow towards the liver. Other: Right renal cyst is noted. IMPRESSION: Sludge and cholelithiasis is noted. No other significant abnormality seen in the right upper quadrant of the abdomen. Electronically Signed   By: Marijo Conception M.D.   On: 03/13/2021 19:57    IMPRESSION:  *Significantly elevated LFTs with AST of 9649 and ALT of 2575.  Total bili 2.2 and ALP normal.  Concern for acute liver failure with INR increasing at 3.1 today.  LFTs were normal 7 months ago.  Ultrasound with doppler is fine.  Has had low Bps and had a NSTEMI.  I think that this is likely shock liver/ischemic hepatitis.  Other things to consider  with transaminases this high is viral hepatitis, autoimmune hepatitis, and medications. *Acute Stroke- brain MRI with multiple acute and subacute infarcts in the left ACA area as well as left PICA territories.  This suggest embolic events. *NSTEMI:  Did not get to have cath because of change in mental status related to stroke. *Atrial fibrillation with RVR:  On heparin here, was on coumadin at home. 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 -Will check acute viral hepatitis studies and an ANA. -May need pressure support. -Limit all hepatotoxic meds. -Trend LFTs and INR.  Laban Emperor. Zehr  04/10/2021, 11:12 AM  ________________________________________________________________________  Velora Heckler GI MD note:  I personally examined the patient, reviewed the data and agree with the assessment and plan described above.  I evaluated her in the ICU and spoke with her husband in her room as well. I think most likely her hepatitis is due to an ischemic hit which does not have to be too severe to cause 'shock liver' picture in some patients. She was taking about 2grams of tylenol daily for a few days prior to admission. That is usually not nearly a high enough dose to cause tylenol related hepatitis. There does not appear to be any other meds that could have caused this either.  I think a course of NAS certainly has very little downside and I spoke with pharmacist about ordering it.  We are adding CMV, EBV, ANA testing as well.  Acute Hep A, B, C have been ruled out by viral panel already.  Would cover with broad spectrum abx for now as well.  Will follow along.  Owens Loffler, MD [facility] Pager [phone]

## 2021-04-10 NOTE — Progress Notes (Signed)
LTM maint complete - no skin breakdown  Maintenance C3 T7 EEG ECG  Leads repositioned [facility] monitored, Event button test confirmed by [facility].

## 2021-04-10 NOTE — Procedures (Signed)
Interventional Radiology Procedure Note  Procedure: Central venous catheter placement  Findings: Please refer to procedural dictation for full description. Bedside right IJ triple lumen central venous catheter placed with ultrasound guidance.  15 cm mark at skin entry site.  Sutured in place.   Complications: None immediate  Estimated Blood Loss: < 5 mL  Recommendations: Follow up post-placement CXR.   Ruthann Cancer, MD Pager: [phone]

## 2021-04-10 NOTE — Progress Notes (Signed)
[facility] ROUNDING NOTE   Subjective:   Interval History: This is a 65 year old lady end-stage renal disease secondary to diabetes.  She has a history of atrial fibrillation and congestive heart failure with systolic dysfunction ejection fraction 25 to 30%.  She has a history of coronary artery disease and status post PTCA.  She is now admitted with an NSTEMI.  Her usual dialysis days are Monday Wednesday Friday.     She has a postop hematoma from recent left AV graft placement.  04/03/2021.  Patient underwent dialysis 04/01/2021.  He did not receive a cardiac catheterization due to pain and discomfort refusing procedure.  She developed acute dyspnea 04/09/2021 and was transferred to the intensive care unit for mechanical ventilation.  Appreciate assistance from critical care.  She started CRRT 06/09/2021  Blood pressure 88/65 pulse 123 temperature 97.5 O2 sats 90% FiO2 40%  Sodium 137 potassium 4 chloride 100 CO2 21 BUN 39 creatinine 4.72 glucose 99 calcium 8.8 phosphorus 4.2 magnesium 2.6 hemoglobin 10         Objective:  Vital signs in last 24 hours:  Temp:  [97.5 F (36.4 C)-98.5 F (36.9 C)] 97.5 F (36.4 C) (09/01 0700) Pulse Rate:  [43-123] 123 (09/01 0818) Resp:  [12-21] 18 (09/01 0818) BP: (88-142)/(47-70) 88/65 (09/01 0818) SpO2:  [100 %] 100 % (09/01 0818) FiO2 (%):  [40 %-100 %] 40 % (09/01 0818) Weight:  [68.7 kg-70.7 kg] 68.7 kg (09/01 0500)  Weight change: 1.2 kg Filed Weights   04/09/21 0405 04/09/21 0903 04/10/21 0500  Weight: 69.5 kg 70.7 kg 68.7 kg    Intake/Output: I/O last 3 completed shifts: In: 2223.4 [I.V.:883.4; NG/GT:340; IV Piggyback:1000] Out: 866 [Other:866]   Intake/Output this shift:  No intake/output data recorded.  Patient intubated CVS- RRR mechanically supported breath sounds RS- CTA ABD- BS present hypoactive bowel sounds EXT- no edema     Basic Metabolic Panel: Recent Labs  Lab 03/24/2021 0806 03/25/2021 0454  04/09/21 0248 04/09/21 0930 04/09/21 1450 04/10/21 0500  NA 137 136 137 132* 136 137  K 6.0* 5.7* 5.5* 6.0* 6.0* 4.0  CL 98 96* 98  --  95* 100  CO2 17* 18* 17*  --  17* 21*  GLUCOSE 73 85 105*  --  158* 99  BUN 57* 78* 34*  --  47* 39*  CREATININE 8.12* 9.62* 5.43*  --  6.22* 4.72*  CALCIUM 9.7 9.1 9.0  --  9.2 8.8*  MG  --   --  2.3  --   --  2.6*  PHOS  --   --   --   --  9.4* 5.2*  5.2*     Liver Function Tests: Recent Labs  Lab 04/02/2021 0454 04/09/21 0248 04/09/21 1450 04/10/21 0500  AST 1,077* 1,363*  --   --   ALT 212* 372*  --   --   ALKPHOS 73 115  --   --   BILITOT 1.3* 1.5*  --   --   PROT 6.2* 6.7  --   --   ALBUMIN 3.0* 3.2* 3.2* 2.9*    Recent Labs  Lab 03/17/2021 0806  LIPASE 26    Recent Labs  Lab 03/12/2021 1552  AMMONIA 68*     CBC: Recent Labs  Lab 03/18/2021 0806 03/16/2021 0454 04/09/21 0248 04/09/21 0930 04/10/21 0500  WBC 8.3 8.5 12.2*  --  18.3*  NEUTROABS  --  6.3  --   --  16.5*  HGB 11.2* 9.6*  11.2* 10.5* 10.0*  HCT 36.9 30.0* 34.5* 31.0* 29.9*  MCV 89.3 84.7 83.5  --  82.4  PLT 328 256 297  --  246     Cardiac Enzymes: No results for input(s): CKTOTAL, CKMB, CKMBINDEX, TROPONINI in the last 168 hours.  BNP: Invalid input(s): POCBNP  CBG: Recent Labs  Lab 04/09/21 1530 04/09/21 1948 04/09/21 2317 04/10/21 0340 04/10/21 0731  GLUCAP 165* 149* 120* 58 133*     Microbiology: Results for orders placed or performed during the hospital encounter of 04/05/2021  Culture, blood (routine x 2)     Status: None (Preliminary result)   Collection Time: 04/09/2021  9:22 AM   Specimen: BLOOD RIGHT FOREARM  Result Value Ref Range Status   Specimen Description BLOOD RIGHT FOREARM  Final   Special Requests   Final    BOTTLES DRAWN AEROBIC AND ANAEROBIC Blood Culture results may not be optimal due to an inadequate volume of blood received in culture bottles   Culture   Final    NO GROWTH 3 DAYS Performed at [facility], [place] [address], [place], [place] [address]    Report Status PENDING  Incomplete  Culture, blood (routine x 2)     Status: None (Preliminary result)   Collection Time: 04/06/2021  9:30 AM   Specimen: BLOOD RIGHT ARM  Result Value Ref Range Status   Specimen Description BLOOD RIGHT ARM  Final   Special Requests   Final    BOTTLES DRAWN AEROBIC AND ANAEROBIC Blood Culture adequate volume   Culture   Final    NO GROWTH 3 DAYS Performed at [facility], [place] [address] [place], [place] [address]    Report Status PENDING  Incomplete  Resp Panel by RT-PCR (Flu A&B, Covid) Nasopharyngeal Swab     Status: None   Collection Time: 03/20/2021 10:04 AM   Specimen: Nasopharyngeal Swab; Nasopharyngeal(NP) swabs in vial transport medium  Result Value Ref Range Status   SARS Coronavirus 2 by RT PCR NEGATIVE NEGATIVE Final    Comment: (NOTE) SARS-CoV-2 target nucleic acids are NOT DETECTED.  The SARS-CoV-2 RNA is generally detectable in upper respiratory specimens during the acute phase of infection. The lowest concentration of SARS-CoV-2 viral copies this assay can detect is 138 copies/mL. A negative result does not preclude SARS-Cov-2 infection and should not be used as the sole basis for treatment or other patient management decisions. A negative result may occur with  improper specimen collection/handling, submission of specimen other than nasopharyngeal swab, presence of viral mutation(s) within the areas targeted by this assay, and inadequate number of viral copies(<138 copies/mL). A negative result must be combined with clinical observations, patient history, and epidemiological information. The expected result is Negative.  Fact Sheet for Patients:  EntrepreneurPulse.com.au  Fact Sheet for Healthcare Providers:  IncredibleEmployment.be  This test is no t yet approved or cleared by the [place] FDA and  has been authorized for detection and/or  diagnosis of SARS-CoV-2 by FDA under an Emergency Use Authorization (EUA). This EUA will remain  in effect (meaning this test can be used) for the duration of the COVID-19 declaration under Section 564(b)(1) of the Act, 21 U.S.C.section 360bbb-3(b)(1), unless the authorization is terminated  or revoked sooner.       Influenza A by PCR NEGATIVE NEGATIVE Final   Influenza B by PCR NEGATIVE NEGATIVE Final    Comment: (NOTE) The Xpert Xpress SARS-CoV-2/FLU/RSV plus assay is intended as an aid in the diagnosis  of influenza from Nasopharyngeal swab specimens and should not be used as a sole basis for treatment. Nasal washings and aspirates are unacceptable for Xpert Xpress SARS-CoV-2/FLU/RSV testing.  Fact Sheet for Patients: EntrepreneurPulse.com.au  Fact Sheet for Healthcare Providers: IncredibleEmployment.be  This test is not yet approved or cleared by the [place] FDA and has been authorized for detection and/or diagnosis of SARS-CoV-2 by FDA under an Emergency Use Authorization (EUA). This EUA will remain in effect (meaning this test can be used) for the duration of the COVID-19 declaration under Section 564(b)(1) of the Act, 21 U.S.C. section 360bbb-3(b)(1), unless the authorization is terminated or revoked.  Performed at [facility], [place] [address], [place], [place] [address]   MRSA Next Gen by PCR, Nasal     Status: None   Collection Time: 04/09/21  3:25 PM   Specimen: Nasal Mucosa; Nasal Swab  Result Value Ref Range Status   MRSA by PCR Next Gen NOT DETECTED NOT DETECTED Final    Comment: (NOTE) The GeneXpert MRSA Assay (FDA approved for NASAL specimens only), is one component of a comprehensive MRSA colonization surveillance program. It is not intended to diagnose MRSA infection nor to guide or monitor treatment for MRSA infections. Test performance is not FDA approved in patients less than 65 years old. Performed at  [facility], [place] [address], [place],  [address]     Coagulation Studies: Recent Labs    03/14/2021 0930 03/23/2021 1809 04/09/21 1450  LABPROT 15.2 21.9* 27.0*  INR 1.2 1.9* 2.5*     Urinalysis: No results for input(s): COLORURINE, LABSPEC, PHURINE, GLUCOSEU, HGBUR, BILIRUBINUR, KETONESUR, PROTEINUR, UROBILINOGEN, NITRITE, LEUKOCYTESUR in the last 72 hours.  Invalid input(s): APPERANCEUR    Imaging: CT HEAD WO CONTRAST (5MM)  Result Date: 04/09/2021 CLINICAL DATA:  Mental status change, unknown cause. Intubated and unresponsive. EXAM: CT HEAD WITHOUT CONTRAST TECHNIQUE: Contiguous axial images were obtained from the base of the skull through the vertex without intravenous contrast. COMPARISON:  Prior head CT examinations 04/06/2021 and earlier. Brain MRI 06/17/2019. FINDINGS: Brain: Mild generalized cerebral and cerebellar atrophy. There are small infarcts within the bilateral cerebellar hemispheres which are new as compared to the brain MRI of 06/17/2019, but otherwise age-indeterminate. Redemonstrated chronic lacunar infarcts versus prominent perivascular spaces within the bilateral basal ganglia (series 2, image 16). In retrospect, these infarcts were present on the head CT head 04/06/2021. Redemonstrated chronic lacunar infarct within the right thalamus. Moderate patchy and ill-defined hypoattenuation within the cerebral white matter, nonspecific but compatible with chronic small vessel ischemic disease. There is no acute intracranial hemorrhage. No demarcated cortical infarct. No extra-axial fluid collection. No evidence of an intracranial mass. No midline shift. Vascular: No hyperdense vessel.  Atherosclerotic calcifications. Skull: Normal. Negative for fracture or focal lesion. Sinuses/Orbits: Visualized orbits show no acute finding. No significant paranasal sinus disease at the imaged levels. IMPRESSION: Small infarcts within the bilateral cerebellar hemispheres are new as  compared to the brain MRI of 06/17/2019, but otherwise age-indeterminate. Consider a brain MRI for further evaluation. Redemonstrated chronic lacunar infarcts versus prominent perivascular spaces within bilateral basal ganglia. Redemonstrated chronic lacunar infarct within the right thalamus. Moderate chronic small vessel ischemic changes within cerebral white matter. Mild generalized parenchymal atrophy. Electronically Signed   By: Kellie Simmering D.O.   On: 04/09/2021 08:50   MR BRAIN WO CONTRAST  Result Date: 04/09/2021 CLINICAL DATA:  Mental status change, unknown cause. EXAM: MRI HEAD WITHOUT CONTRAST TECHNIQUE: Multiplanar, multiecho pulse sequences  of the brain and surrounding structures were obtained without intravenous contrast. COMPARISON:  Head CT April 09, 2021 FINDINGS: The study is partially degraded by motion. Brain: A 9 mm focus of restricted diffusion is seen in the left cerebellar hemisphere. Scattered foci of restricted diffusion are also seen within the deep white matter of the left frontal lobe, as well as within the medial left frontal parietal cortex. No hemorrhage, hydrocephalus, extra-axial collection or mass lesion. Remote lacunar infarcts in the bilateral cerebellar hemispheres and right thalamus. Scattered and confluent foci of T2 hyperintensity are seen within the white matter of the cerebral hemispheres, nonspecific, most likely related to chronic small vessel ischemia. Moderate parenchymal volume loss. Focus of susceptibility artifact in the left amygdala suggesting hemosiderin deposit. Vascular: Normal flow voids. Skull and upper cervical spine: Normal marrow signal. Sinuses/Orbits: Negative. Other: None. IMPRESSION: 1. Multiple small acute/subacute infarcts in the left ACA and left PICA territories, suggesting embolic events. 2. Moderate chronic microvascular ischemic changes of the white matter and parenchymal. Electronically Signed   By: Pedro Earls M.D.   On:  04/09/2021 17:04   CARDIAC CATHETERIZATION  Result Date: 03/25/2021 Procedure was canceled at the patient's request   DG CHEST PORT 1 VIEW  Result Date: 04/09/2021 CLINICAL DATA:  History of heart failure, intubated EXAM: PORTABLE CHEST 1 VIEW COMPARISON:  Chest radiograph 03/23/2021 FINDINGS: The endotracheal tube terminates approximately 1.7 cm from the carina. The left dialysis catheter terminates in the right atrium. Defibrillator pads and additional leads overlie the patient. The cardiomediastinal silhouette is stable, with unchanged cardiomegaly. There is no focal consolidation or pulmonary edema. There is no pleural effusion or pneumothorax. The bones are stable. IMPRESSION: 1. Endotracheal tube terminates approximately 1.7 cm from the carina. 2. Unchanged cardiomegaly. 3. No radiographic evidence of acute cardiopulmonary process. Electronically Signed   By: Valetta Mole M.D.   On: 04/09/2021 10:31   DG Abd Portable 1V  Result Date: 04/09/2021 CLINICAL DATA:  Feeding tube placement EXAM: PORTABLE ABDOMEN - 1 VIEW COMPARISON:  CT scan 03/23/2021 FINDINGS: Feeding tube extends into the stomach and terminates in the gastric body. There is some residual contrast medium in the transverse colon. Extensive atherosclerotic vascular calcifications noted. IMPRESSION: 1. The feeding tube tip is in the stomach body. 2. Atherosclerosis. Electronically Signed   By: Van Clines M.D.   On: 04/09/2021 18:23   EEG adult  Result Date: 04/09/2021 Lora Havens, MD     04/09/2021 11:32 AM Patient Name: Nyala Kirchner MRN: [id number] Epilepsy Attending: Lora Havens Referring Physician/Provider: Dr Su Monks Date: 04/09/2021 Duration: 23.17 mins Patient history: 65yo F with altered mental status with questionable left arm seizure activity. EEG to evaluate for seizure Level of alertness: Awake AEDs during EEG study: None Technical aspects: This EEG study was done with scalp electrodes positioned  according to the 10-20 International system of electrode placement. Electrical activity was acquired at a sampling rate of '500Hz'  and reviewed with a high frequency filter of '70Hz'  and a low frequency filter of '1Hz' . EEG data were recorded continuously and digitally stored. Description: EEG showed continuous generalized 3 to 6 Hz theta-delta slowing, at times with triphasic morphology.  Hyperventilation and photic stimulation were not performed.   ABNORMALITY - Continuous slow, generalized IMPRESSION: This study is suggestive of moderate diffuse encephalopathy, nonspecific etiology. No seizures or epileptiform discharges were seen throughout the recording. Lora Havens   ECHOCARDIOGRAM COMPLETE  Result Date: 04/09/2021    ECHOCARDIOGRAM REPORT  Patient Name:   VALEDA CORZINE Date of Exam: 04/09/2021 Medical Rec #:  [id number]        Height:       67.0 in Accession #:    [id number]       Weight:       155.9 lb Date of Birth:  Sep 08, 1955        BSA:          1.819 m Patient Age:    9 years         BP:           103/67 mmHg Patient Gender: F                HR:           49 bpm. Exam Location:  Inpatient Procedure: 2D Echo, Cardiac Doppler, Color Doppler and Intracardiac            Opacification Agent Indications:    122-I22.9 Subsequent ST elevation (STEM) and non-ST elevation                 (NSTEMI) myocardial infarction  History:        Patient has prior history of Echocardiogram examinations, most                 recent 07/01/2019. Cardiomyopathy, Previous Myocardial                 Infarction, Acute MI and CAD, Abnormal ECG, Stroke,                 Arrythmias:Atrial Fibrillation,                 Signs/Symptoms:Dizziness/Lightheadedness and Dyspnea; Risk                 Factors:Hypertension. ESRD.  Sonographer:    Roseanna Rainbow RDCS Referring Phys: [id number] [place]  1. Left ventricular ejection fraction, by estimation, is 25 to 30%. The left ventricle has severely decreased function. The  left ventricle demonstrates global hypokinesis. There is moderate left ventricular hypertrophy. Left ventricular diastolic function could not be evaluated. Elevated left ventricular end-diastolic pressure. There is akinesis of the left ventricular, mid-apical septal wall.  2. Right ventricular systolic function is mildly reduced. The right ventricular size is normal.  3. Left atrial size was moderately dilated.  4. Right atrial size was mildly dilated.  5. The mitral valve is abnormal. Mild mitral valve regurgitation. No evidence of mitral stenosis. Moderate to severe mitral annular calcification.  6. The aortic valve is grossly normal. There is mild calcification of the aortic valve. There is mild thickening of the aortic valve. Aortic valve regurgitation is not visualized. Mild to moderate aortic valve sclerosis/calcification is present, without  any evidence of aortic stenosis. Comparison(s): Prior images reviewed side by side. Changes from prior study are noted. Conclusion(s)/Recommendation(s): Rhythm changed between sinus bradycardia and rate controlled atrial fibrillation during study. Overall, compared to prior the pattern of wall motion abnormalities is similar, but they are more pronounced on current study,  with slight decrease in EF from prior. Findings communicated with NP Reino Bellis. FINDINGS  Left Ventricle: Global hypokinesis with focal akinesis of the mid to distal anterior and inferior septum. Left ventricular ejection fraction, by estimation, is 25 to 30%. The left ventricle has severely decreased function. The left ventricle demonstrates global hypokinesis. The left ventricular internal cavity size was normal in size. There is moderate left ventricular hypertrophy. Left ventricular diastolic function could  not be evaluated due to atrial fibrillation. Left ventricular diastolic function could not be evaluated. Elevated left ventricular end-diastolic pressure. Right Ventricle: The right  ventricular size is normal. Right vetricular wall thickness was not well visualized. Right ventricular systolic function is mildly reduced. Left Atrium: Left atrial size was moderately dilated. Right Atrium: Right atrial size was mildly dilated. Pericardium: There is no evidence of pericardial effusion. Mitral Valve: The mitral valve is abnormal. There is mild thickening of the mitral valve leaflet(s). There is mild calcification of the mitral valve leaflet(s). Moderate to severe mitral annular calcification. Mild mitral valve regurgitation. No evidence  of mitral valve stenosis. Tricuspid Valve: The tricuspid valve is normal in structure. Tricuspid valve regurgitation is trivial. No evidence of tricuspid stenosis. Aortic Valve: The aortic valve is grossly normal. There is mild calcification of the aortic valve. There is mild thickening of the aortic valve. There is moderate aortic valve annular calcification. Aortic valve regurgitation is not visualized. Mild to moderate aortic valve sclerosis/calcification is present, without any evidence of aortic stenosis. Pulmonic Valve: The pulmonic valve was not well visualized. Pulmonic valve regurgitation is not visualized. No evidence of pulmonic stenosis. Aorta: The aortic root, ascending aorta and aortic arch are all structurally normal, with no evidence of dilitation or obstruction. Venous: IVC assessment for right atrial pressure unable to be performed due to mechanical ventilation. IAS/Shunts: The interatrial septum appears to be lipomatous. The atrial septum is grossly normal.  LEFT VENTRICLE PLAX 2D LVIDd:         4.80 cm      Diastology LVIDs:         4.57 cm      LV e' medial:    2.13 cm/s LV PW:         1.80 cm      LV E/e' medial:  46.1 LV IVS:        1.20 cm      LV e' lateral:   2.13 cm/s LVOT diam:     1.90 cm      LV E/e' lateral: 46.1 LV SV:         40 LV SV Index:   22 LVOT Area:     2.84 cm  LV Volumes (MOD) LV vol d, MOD A2C: 97.9 ml LV vol d, MOD A4C:  115.3 ml LV vol s, MOD A2C: 65.4 ml LV vol s, MOD A4C: 69.9 ml LV SV MOD A2C:     32.5 ml LV SV MOD A4C:     115.3 ml LV SV MOD BP:      39.0 ml RIGHT VENTRICLE            IVC RV S prime:     5.70 cm/s  IVC diam: 1.90 cm TAPSE (M-mode): 1.5 cm LEFT ATRIUM             Index       RIGHT ATRIUM           Index LA diam:        3.40 cm 1.87 cm/m  RA Area:     15.70 cm LA Vol (A2C):   69.9 ml 38.43 ml/m RA Volume:   44.20 ml  24.30 ml/m LA Vol (A4C):   44.6 ml 24.52 ml/m LA Biplane Vol: 56.4 ml 31.01 ml/m  AORTIC VALVE LVOT Vmax:   70.70 cm/s LVOT Vmean:  39.200 cm/s LVOT VTI:    0.140 m  AORTA Ao Root diam: 3.10 cm Ao Asc diam:  3.00 cm MITRAL VALVE MV Area (PHT): 3.31 cm    SHUNTS MV Decel Time: 229 msec    Systemic VTI:  0.14 m MV E velocity: 98.10 cm/s  Systemic Diam: 1.90 cm Buford Dresser MD Electronically signed by Buford Dresser MD Signature Date/Time: 04/09/2021/6:24:41 PM    Final    US LIVER DOPPLER  Result Date: 04/10/2021 CLINICAL DATA:  Transaminitis EXAM: DUPLEX ULTRASOUND OF LIVER TECHNIQUE: Color and duplex Doppler ultrasound was performed to evaluate the hepatic in-flow and out-flow vessels. COMPARISON:  CT 04/06/2021 FINDINGS: Liver: Normal parenchymal echogenicity. Normal hepatic contour without nodularity. No focal lesion, mass or intrahepatic biliary ductal dilatation. Main Portal Vein size: 0.9 cm Portal Vein Velocities (all hepatopetal): Main Prox:  20 cm/sec Main Mid: 14 cm/sec Main Dist:  11 cm/sec Right: 9 cm/sec Left: 9 cm/sec Hepatic Vein Velocities (all hepatofugal): Right:  9 cm/sec Middle:  10 cm/sec Left:  15 cm/sec IVC: Present and patent with normal respiratory phasicity. Hepatic Artery Velocity:  106 cm/sec Splenic Vein Velocity:  12 cm/sec Spleen: 8.8 cm x 3.9 cm x 3.5 cm with a total volume of 62 cm^3 (411 cm^3 is upper limit normal) Portal Vein Occlusion/Thrombus: No Splenic Vein Occlusion/Thrombus: No Ascites: None Varices: None IMPRESSION: Unremarkable  hepatic vascular Doppler evaluation. Electronically Signed   By: Lucrezia Europe M.D.   On: 04/10/2021 08:12   US ABDOMEN LIMITED RUQ (LIVER/GB)  Result Date: 03/16/2021 CLINICAL DATA:  Elevated liver function tests. EXAM: ULTRASOUND ABDOMEN LIMITED RIGHT UPPER QUADRANT COMPARISON:  April 07, 2021. FINDINGS: Gallbladder: Sludge and cholelithiasis is noted. No gallbladder wall thickening or pericholecystic fluid is noted. No sonographic Murphy's sign is noted. Common bile duct: Diameter: 4 mm which is within normal limits. Liver: No focal lesion identified. Within normal limits in parenchymal echogenicity. Portal vein is patent on color Doppler imaging with normal direction of blood flow towards the liver. Other: Right renal cyst is noted. IMPRESSION: Sludge and cholelithiasis is noted. No other significant abnormality seen in the right upper quadrant of the abdomen. Electronically Signed   By: Marijo Conception M.D.   On: 03/22/2021 19:57     Medications:    feeding supplement (VITAL 1.5 CAL) 1,000 mL (04/09/21 2000)   heparin 1,250 Units/hr (04/10/21 0700)   prismasol BGK 2/2.5 dialysis solution 2,000 mL/hr at 04/10/21 0726   prismasol BGK 2/2.5 replacement solution 500 mL/hr at 04/10/21 0725   prismasol BGK 2/2.5 replacement solution 500 mL/hr at 04/09/21 1903    aspirin  81 mg Per Tube Daily   B-complex with vitamin C  1 tablet Per Tube Daily   chlorhexidine gluconate (MEDLINE KIT)  15 mL Mouth Rinse BID   Chlorhexidine Gluconate Cloth  6 each Topical Daily   docusate  100 mg Per Tube BID   feeding supplement (PROSource TF)  45 mL Per Tube TID   insulin aspart  0-5 Units Subcutaneous QHS   insulin aspart  0-6 Units Subcutaneous TID WC   mouth rinse  15 mL Mouth Rinse 10 times per day   polyethylene glycol  17 g Per Tube Daily   sodium chloride flush  3 mL Intravenous Q12H   alteplase, fentaNYL (SUBLIMAZE) injection, fentaNYL (SUBLIMAZE) injection, heparin, midazolam, midazolam, sodium  chloride  Assessment/ Plan:  Dialysis Orders: [facility] MWF 4HR 15MIN, 3K, 2.ca , EDW 70.0 kg just lowered half kilogram  Right IJ PC ,LUA AVGG inserted 7/22/220 Mircera 60 MCG on 03/19/21 2000 units heparin heparin Calcitriol 1.75 MCG  Assessment/Plan Non-STEMI= plan per admit/cardiology consulted.  She was scheduled for cardiac catheterization but was having chest pain and refused. AMS   MRI ordered EEG pending neurology following ESRD -HD MWF patient underwent dialysis 03/26/2021.  Patient initiated CRRT 04/09/2021 Hypertension/volume  -we will start CRRT for volume removal. 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

## 2021-04-10 NOTE — Progress Notes (Addendum)
STROKE TEAM PROGRESS NOTE   INTERVAL HISTORY Her daughter and husband are at the bedside.  She remains intubated, and off sedation and very minimally responsive.  EEG shows no seizure activity but does reveal periodic discharges on the ictal/interictal spectrum.  No definite witnessed seizure activity.  She is currently getting hemodialysis.  She is   hypotensive.  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 Vitals:   04/10/21 1030 04/10/21 1045 04/10/21 1130 04/10/21 1132  BP: (!) 80/70 (!) 79/57 (!) 87/70   Pulse: 97 90    Resp: '18 19 18   '$ Temp:    (!) 91.5 F (33.1 C)  TempSrc:    Axillary  SpO2: 100% 100%    Weight:      Height:       CBC:  Recent Labs  Lab 03/22/2021 0454 04/09/21 0248 04/09/21 0930 04/10/21 0500  WBC 8.5 12.2*  --  18.3*  NEUTROABS 6.3  --   --  16.5*  HGB 9.6* 11.2* 10.5* 10.0*  HCT 30.0* 34.5* 31.0* 29.9*  MCV 84.7 83.5  --  82.4  PLT 256 297  --  0000000   Basic Metabolic Panel:  Recent Labs  Lab 04/09/21 0248 04/09/21 0930 04/09/21 1450 04/10/21 0500  NA 137   < > 136 137  K 5.5*   < > 6.0* 4.0  CL 98  --  95* 100  CO2 17*  --  17* 21*  GLUCOSE 105*  --  158* 99  BUN 34*  --  47* 39*  CREATININE 5.43*  --  6.22* 4.72*  CALCIUM 9.0  --  9.2 8.8*  MG 2.3  --   --  2.6*  PHOS  --   --  9.4* 5.2*  5.2*   < > = values in this interval not displayed.    Lipid Panel:  Recent Labs  Lab 04/06/2021 0454  CHOL 165  TRIG 62  HDL 63  CHOLHDL 2.6  VLDL 12  LDLCALC 90    HgbA1c:  Recent Labs  Lab 03/21/2021 0454  HGBA1C 7.0*   Urine Drug Screen: No results for input(s): LABOPIA, COCAINSCRNUR, LABBENZ, AMPHETMU, THCU, LABBARB in the last 168 hours.  Alcohol Level No results for input(s): ETH in the last 168 hours.  IMAGING past 24 hours MR BRAIN WO CONTRAST  Result Date: 04/09/2021 CLINICAL DATA:  Mental status change, unknown cause. EXAM: MRI HEAD WITHOUT CONTRAST TECHNIQUE: Multiplanar, multiecho pulse sequences of the brain and  surrounding structures were obtained without intravenous contrast. COMPARISON:  Head CT April 09, 2021 FINDINGS: The study is partially degraded by motion. Brain: A 9 mm focus of restricted diffusion is seen in the left cerebellar hemisphere. Scattered foci of restricted diffusion are also seen within the deep white matter of the left frontal lobe, as well as within the medial left frontal parietal cortex. No hemorrhage, hydrocephalus, extra-axial collection or mass lesion. Remote lacunar infarcts in the bilateral cerebellar hemispheres and right thalamus. Scattered and confluent foci of T2 hyperintensity are seen within the white matter of the cerebral hemispheres, nonspecific, most likely related to chronic small vessel ischemia. Moderate parenchymal volume loss. Focus of susceptibility artifact in the left amygdala suggesting hemosiderin deposit. Vascular: Normal flow voids. Skull and upper cervical spine: Normal marrow signal. Sinuses/Orbits: Negative. Other: None. IMPRESSION: 1. Multiple small acute/subacute infarcts in the left ACA and left PICA territories, suggesting embolic events. 2. Moderate chronic microvascular ischemic changes of the white matter and parenchymal. Electronically  Signed   By: Pedro Earls M.D.   On: 04/09/2021 17:04   DG Chest Port 1 View  Result Date: 04/10/2021 CLINICAL DATA:  Abnormal respiration EXAM: PORTABLE CHEST 1 VIEW COMPARISON:  Chest x-ray dated April 09, 2021 FINDINGS: Slight interval retraction of ET tube with tip positioned approximately 3.5 cm from the carina. Unchanged position of left central venous line. Enteric feeding tube partially seen coursing below the diaphragm. Cardiac and mediastinal contours are unchanged. Lungs are clear. No large pleural effusion or evidence of pneumothorax. IMPRESSION: Lungs are clear. Electronically Signed   By: Yetta Glassman M.D.   On: 04/10/2021 10:46   DG Abd Portable 1V  Result Date: 04/09/2021 CLINICAL  DATA:  Feeding tube placement EXAM: PORTABLE ABDOMEN - 1 VIEW COMPARISON:  CT scan 03/28/2021 FINDINGS: Feeding tube extends into the stomach and terminates in the gastric body. There is some residual contrast medium in the transverse colon. Extensive atherosclerotic vascular calcifications noted. IMPRESSION: 1. The feeding tube tip is in the stomach body. 2. Atherosclerosis. Electronically Signed   By: Van Clines M.D.   On: 04/09/2021 18:23   Overnight EEG with video  Result Date: 04/10/2021 Lora Havens, MD     04/10/2021  9:40 AM Patient Name: Amber Young MRN: [id number] Epilepsy Attending: Lora Havens Referring Physician/Provider: Payton Doughty, NP Duration: 04/09/2021 1925 to 04/10/2021 0930  Patient history: 65yo F with altered mental status with questionable left arm seizure activity. EEG to evaluate for seizure  Level of alertness: Awake, asleep  AEDs during EEG study: None  Technical aspects: This EEG study was done with scalp electrodes positioned according to the 10-20 International system of electrode placement. Electrical activity was acquired at a sampling rate of '500Hz'$  and reviewed with a high frequency filter of '70Hz'$  and a low frequency filter of '1Hz'$ . EEG data were recorded continuously and digitally stored.  Description: EEG showed continuous generalized 3 to 6 Hz theta-delta slowing.  Generalized periodic discharges with triphasic morphology at 1 to 1.5 Hz were also noted, predominantly when patient was awake/stimulated.  Hyperventilation and photic stimulation were not performed.    ABNORMALITY - Continuous slow, generalized - Periodic discharges with triphasic morphology, generalized  IMPRESSION: This study showed generalized periodic discharges with triphasic morphology which is on the ictal-interictal continuum.  However, the morphology, frequency and reactivity to stimulation is more commonly seen due to toxic-metabolic causes.  There is also moderate diffuse  encephalopathy, nonspecific etiology. No seizures were seen throughout the recording.  Lora Havens   ECHOCARDIOGRAM COMPLETE  Result Date: 04/09/2021    ECHOCARDIOGRAM REPORT   Patient Name:   Amber Young Date of Exam: 04/09/2021 Medical Rec #:  [id number]        Height:       67.0 in Accession #:    [id number]       Weight:       155.9 lb Date of Birth:  09/06/1955        BSA:          1.819 m Patient Age:    59 years         BP:           103/67 mmHg Patient Gender: F                HR:           49 bpm. Exam Location:  Inpatient Procedure: 2D Echo, Cardiac Doppler, Color Doppler and Intracardiac  Opacification Agent Indications:    122-I22.9 Subsequent ST elevation (STEM) and non-ST elevation                 (NSTEMI) myocardial infarction  History:        Patient has prior history of Echocardiogram examinations, most                 recent 07/01/2019. Cardiomyopathy, Previous Myocardial                 Infarction, Acute MI and CAD, Abnormal ECG, Stroke,                 Arrythmias:Atrial Fibrillation,                 Signs/Symptoms:Dizziness/Lightheadedness and Dyspnea; Risk                 Factors:Hypertension. ESRD.  Sonographer:    Roseanna Rainbow RDCS Referring Phys: [id number] [place]  1. Left ventricular ejection fraction, by estimation, is 25 to 30%. The left ventricle has severely decreased function. The left ventricle demonstrates global hypokinesis. There is moderate left ventricular hypertrophy. Left ventricular diastolic function could not be evaluated. Elevated left ventricular end-diastolic pressure. There is akinesis of the left ventricular, mid-apical septal wall.  2. Right ventricular systolic function is mildly reduced. The right ventricular size is normal.  3. Left atrial size was moderately dilated.  4. Right atrial size was mildly dilated.  5. The mitral valve is abnormal. Mild mitral valve regurgitation. No evidence of mitral stenosis. Moderate to severe  mitral annular calcification.  6. The aortic valve is grossly normal. There is mild calcification of the aortic valve. There is mild thickening of the aortic valve. Aortic valve regurgitation is not visualized. Mild to moderate aortic valve sclerosis/calcification is present, without  any evidence of aortic stenosis. Comparison(s): Prior images reviewed side by side. Changes from prior study are noted. Conclusion(s)/Recommendation(s): Rhythm changed between sinus bradycardia and rate controlled atrial fibrillation during study. Overall, compared to prior the pattern of wall motion abnormalities is similar, but they are more pronounced on current study,  with slight decrease in EF from prior. Findings communicated with NP Reino Bellis. FINDINGS  Left Ventricle: Global hypokinesis with focal akinesis of the mid to distal anterior and inferior septum. Left ventricular ejection fraction, by estimation, is 25 to 30%. The left ventricle has severely decreased function. The left ventricle demonstrates global hypokinesis. The left ventricular internal cavity size was normal in size. There is moderate left ventricular hypertrophy. Left ventricular diastolic function could not be evaluated due to atrial fibrillation. Left ventricular diastolic function could not be evaluated. Elevated left ventricular end-diastolic pressure. Right Ventricle: The right ventricular size is normal. Right vetricular wall thickness was not well visualized. Right ventricular systolic function is mildly reduced. Left Atrium: Left atrial size was moderately dilated. Right Atrium: Right atrial size was mildly dilated. Pericardium: There is no evidence of pericardial effusion. Mitral Valve: The mitral valve is abnormal. There is mild thickening of the mitral valve leaflet(s). There is mild calcification of the mitral valve leaflet(s). Moderate to severe mitral annular calcification. Mild mitral valve regurgitation. No evidence  of mitral valve  stenosis. Tricuspid Valve: The tricuspid valve is normal in structure. Tricuspid valve regurgitation is trivial. No evidence of tricuspid stenosis. Aortic Valve: The aortic valve is grossly normal. There is mild calcification of the aortic valve. There is mild thickening of the aortic valve. There is moderate aortic valve annular  calcification. Aortic valve regurgitation is not visualized. Mild to moderate aortic valve sclerosis/calcification is present, without any evidence of aortic stenosis. Pulmonic Valve: The pulmonic valve was not well visualized. Pulmonic valve regurgitation is not visualized. No evidence of pulmonic stenosis. Aorta: The aortic root, ascending aorta and aortic arch are all structurally normal, with no evidence of dilitation or obstruction. Venous: IVC assessment for right atrial pressure unable to be performed due to mechanical ventilation. IAS/Shunts: The interatrial septum appears to be lipomatous. The atrial septum is grossly normal.  LEFT VENTRICLE PLAX 2D LVIDd:         4.80 cm      Diastology LVIDs:         4.57 cm      LV e' medial:    2.13 cm/s LV PW:         1.80 cm      LV E/e' medial:  46.1 LV IVS:        1.20 cm      LV e' lateral:   2.13 cm/s LVOT diam:     1.90 cm      LV E/e' lateral: 46.1 LV SV:         40 LV SV Index:   22 LVOT Area:     2.84 cm  LV Volumes (MOD) LV vol d, MOD A2C: 97.9 ml LV vol d, MOD A4C: 115.3 ml LV vol s, MOD A2C: 65.4 ml LV vol s, MOD A4C: 69.9 ml LV SV MOD A2C:     32.5 ml LV SV MOD A4C:     115.3 ml LV SV MOD BP:      39.0 ml RIGHT VENTRICLE            IVC RV S prime:     5.70 cm/s  IVC diam: 1.90 cm TAPSE (M-mode): 1.5 cm LEFT ATRIUM             Index       RIGHT ATRIUM           Index LA diam:        3.40 cm 1.87 cm/m  RA Area:     15.70 cm LA Vol (A2C):   69.9 ml 38.43 ml/m RA Volume:   44.20 ml  24.30 ml/m LA Vol (A4C):   44.6 ml 24.52 ml/m LA Biplane Vol: 56.4 ml 31.01 ml/m  AORTIC VALVE LVOT Vmax:   70.70 cm/s LVOT Vmean:  39.200 cm/s  LVOT VTI:    0.140 m  AORTA Ao Root diam: 3.10 cm Ao Asc diam:  3.00 cm MITRAL VALVE MV Area (PHT): 3.31 cm    SHUNTS MV Decel Time: 229 msec    Systemic VTI:  0.14 m MV E velocity: 98.10 cm/s  Systemic Diam: 1.90 cm Buford Dresser MD Electronically signed by Buford Dresser MD Signature Date/Time: 04/09/2021/6:24:41 PM    Final    US LIVER DOPPLER  Result Date: 04/10/2021 CLINICAL DATA:  Transaminitis EXAM: DUPLEX ULTRASOUND OF LIVER TECHNIQUE: Color and duplex Doppler ultrasound was performed to evaluate the hepatic in-flow and out-flow vessels. COMPARISON:  CT 03/17/2021 FINDINGS: Liver: Normal parenchymal echogenicity. Normal hepatic contour without nodularity. No focal lesion, mass or intrahepatic biliary ductal dilatation. Main Portal Vein size: 0.9 cm Portal Vein Velocities (all hepatopetal): Main Prox:  20 cm/sec Main Mid: 14 cm/sec Main Dist:  11 cm/sec Right: 9 cm/sec Left: 9 cm/sec Hepatic Vein Velocities (all hepatofugal): Right:  9 cm/sec Middle:  10 cm/sec Left:  15 cm/sec  IVC: Present and patent with normal respiratory phasicity. Hepatic Artery Velocity:  106 cm/sec Splenic Vein Velocity:  12 cm/sec Spleen: 8.8 cm x 3.9 cm x 3.5 cm with a total volume of 62 cm^3 (411 cm^3 is upper limit normal) Portal Vein Occlusion/Thrombus: No Splenic Vein Occlusion/Thrombus: No Ascites: None Varices: None IMPRESSION: Unremarkable hepatic vascular Doppler evaluation. Electronically Signed   By: Lucrezia Europe M.D.   On: 04/10/2021 08:12    PHYSICAL EXAM  Mental Status:  Intubated and sedated, middle-aged African-American lady opens eyes spontaneously but does not fixate or track examiner.  She does not follow commands.  She opens eyes partially to sternal rub but will not follow gaze. No neglect is noted.  Cranial Nerves:  II: PERRL 3 mm/brisk.  III, IV, VI: Oculocephalic reflex is intact V: Corneal reflexes are intact. weakly blinks to threat.  Bilaterally VII: Unable to assess facial  asymmetry 2/2 ETT VIII: Unable to assess due to patient condition IX, X: Cough and gag reflexes are intact XI: Head is grossly midline XII: Unable to assess due to patient condition Motor: Right upper and lower extremities are without spontaneous movement or withdraw to noxious stimuli Left upper extremity withdraws to noxious stimuli, and occasionally moves spontaneously nonpurposefully. left lower extremity with extension movement with application of noxious stimuli and does not withdraw.    Tone is normal. Bulk is normal.  Sensation: Patient withdraws left upper and lower extremities with application of noxious stimuli. Right hemibody without response to noxious stimuli but with application of noxious stimuli on the right, the corresponding left extremity reacts.  Coordination: Unable to assess due to patient's condition Gait: Deferred  ASSESSMENT/PLAN Ms. Tajanee Brice is a 65 y.o. female with history of  atrial fibrillation on coumadin, chronic combined systolic and diastolic CHF with EF of [id number], ESRD on HD M,W,F, CAD s/p PCI to LAD in 2017, RCA 2019, hyperlipidemia, essential hypertension, stroke without residual deficits, type 2 diabetes mellitus, and a recent AV graft placement of the left arm on 8/25, who presented to the ED 03/10/2021 for evaluation of 2 days of malaise, weakness, fatigue, and decreased appetite and was found to have an NSTEMI.   She was to have a cardiac cath and anticoagulation was discontinued and she was placed on heparin drip. The cath did not happen because developed significantly altered mentation. She was seen staring into space and had left arm twitching concerning for seizure activity. CTH showed age indeterminate small infarcts at bilateral cerebellar hemispheres, as well as chronic infarcts at bilateral basal ganglia, right thalamus, and small vessel changes. MRI brain done shows an acute left cerebellar stroke, as well as remote strokes at bilateral  cerebellar hemispheres and right thalamus, suggestive of embolic eents affecting the left ACA and left PICA territories.   EEG today shows periodic discharges on the ictal -interictal continuum, as well as moderate diffuse encephalopathy, but no seizures seen.   Given eeg findings patient was loaded with keppra 1.5gm and started on keppra '750mg'$  BID. She is on a heparin drip, and given her NSTEMI, we agree to continued use but are aware of risk for bleeding.    Stroke :  bilateral   infarct embolic secondary to atrial fibrillation, MI, and off anticoagulation for recent surgery  CT head: Small infarcts within the bilateral cerebellar hemispheres are new as compared to the brain MRI of 06/17/2019, but otherwise age-indeterminate. Consider a brain MRI for further evaluation. Redemonstrated chronic lacunar infarcts versus prominent perivascular spaces within bilateral  basal ganglia. Redemonstrated chronic lacunar infarct within the right thalamus. Moderate chronic small vessel ischemic changes within cerebral white matter. Mild generalized parenchymal atrophy.  MRI :  1. Multiple small acute/subacute infarcts in the left ACA and left PICA territories, suggesting embolic events. 2. Moderate chronic microvascular ischemic changes of the white matter and parenchymal. MRA HEAD AND NECK  pending 2D Echo  LVEF 25-30%, LA dilated, lipotamous interatrial septum.    LDL 90 HgbA1c 7.0 VTE prophylaxis - SCD    There are no active orders of the following types: Diet, Nourishments.   warfarin daily prior to admission, now on heparin IV.   Therapy recommendations:    Disposition:  unclear  Hypertension Home meds:  amlodipine, coreg, hydralazine, Imdur,  Unstable Permissive hypertension (OK if < 220/120) but gradually normalize in 5-7 days Long-term BP goal normotensive  Hyperlipidemia Home meds:  lipitor, not resumed in hospital d/t liver failure LDL 90, goal < 70   Diabetes type II  Controlled Home meds:  glucotrol, insulin HgbA1c 7.0, goal < 7.0 CBGs Recent Labs    04/10/21 0340 04/10/21 0731 04/10/21 1128  GLUCAP 92 133* 154*    SSI  Other Stroke Risk Factors  Advanced Age >/= 55   Hx stroke/TIA  Family hx stroke    Coronary artery disease  Congestive heart failure    Hospital day # 3 I have personally obtained history,examined this patient, reviewed notes, independently viewed imaging studies, participated in medical decision making and plan of care.ROS completed by me personally and pertinent positives fully documented  I have made any additions or clarifications directly to the above note. Agree with note above.  Patient is critically ill with acute myocardial infarction, renal failure, hepatic encephalopathy and hypotension.  MRI shows right ACA and left cerebellar embolic infarcts likely while being off anticoagulation for elective surgery having atrial fibrillation, recent MI as well.  EEG shows periodic discharges and patient's family has described staring episodes raising concern for seizures hence we will start [place] for seizures and continue long-term EEG monitoring.  Recommend IV heparin drip stroke protocol despite INR being elevated which is likely falsely elevated due to hepatic dysfunction.  Long discussion with patient's husband and daughter and answered questions.  Long discussion with Dr.Meier and answered questions This patient is critically ill and at significant risk of neurological worsening, death and care requires constant monitoring of vital signs, hemodynamics,respiratory and cardiac monitoring, extensive review of multiple databases, frequent neurological assessment, discussion with family, other specialists and medical decision making of high complexity.I have made any additions or clarifications directly to the above note.This critical care time does not reflect procedure time, or teaching time or supervisory time of PA/NP/Med Resident etc  but could involve care discussion time.  I spent 30 minutes of neurocritical care time  in the care of  this patient.     Antony Contras, MD Medical Director [employer] Pager: [phone] 04/10/2021 1:42 PM     To contact Stroke Continuity provider, please refer to [email or web address] After hours, contact General Neurology

## 2021-04-10 NOTE — Procedures (Addendum)
Patient Name: Amber Young  MRN: [id number]  Epilepsy Attending: Lora Havens  Referring Physician/Provider: Payton Doughty, NP Duration: 04/09/2021 1925 to 04/10/2021 1925   Patient history: 65yo F with altered mental status with questionable left arm seizure activity. EEG to evaluate for seizure   Level of alertness: Awake, asleep   AEDs during EEG study: None   Technical aspects: This EEG study was done with scalp electrodes positioned according to the 10-20 International system of electrode placement. Electrical activity was acquired at a sampling rate of '500Hz'$  and reviewed with a high frequency filter of '70Hz'$  and a low frequency filter of '1Hz'$ . EEG data were recorded continuously and digitally stored.    Description: EEG showed continuous generalized 3 to 6 Hz theta-delta slowing.  Generalized periodic discharges with triphasic morphology at 1 to 1.5 Hz were also noted, predominantly when patient was awake/stimulated.  Hyperventilation and photic stimulation were not performed.      ABNORMALITY - Continuous slow, generalized - Periodic discharges with triphasic morphology, generalized   IMPRESSION: This study showed generalized periodic discharges with triphasic morphology which is on the ictal-interictal continuum.  However, the morphology, frequency and reactivity to stimulation is more commonly seen due to toxic-metabolic causes.  There is also moderate diffuse encephalopathy, nonspecific etiology. No seizures were seen throughout the recording.   [name] [name]

## 2021-04-10 NOTE — Consult Note (Addendum)
NAME:  Amber Young, MRN:  [id number], DOB:  [birth year], LOS: 3 ADMISSION DATE:  03/12/2021, CONSULTATION DATE: 04/09/2021 REFERRING MD: [facility] hospital[facility], CHIEF COMPLAINT: Acute respiratory failure and altered mental status in the setting of benign elevated ST MI with troponins of 14,000 and in a patient with end-stage renal disease.  History of Present Illness:  Amber Young is a 65 year old female her of health issues significant for but not limited to atrial fibrillation combined systolic and diastolic heart failure with last 2D showing EF of 40 to 45% in 2019.  She has also end-stage renal disease and has a tunneled catheter and a new AV graft placed on 825 left forearm.  She had the graft placed on 825 and return to [facility] on 03/26/2021 with general malaise not feeling well left-sided chest pain troponin greater than 14,000.  She was scheduled for cardiac catheterization on 04/02/2021 but patient was unable to complete at a time due to pain and discomfort and refusing the procedure. 04/09/2021 she has been transferred to the intensive care unit full mechanical ventilatory support anticoagulation is on hold neurology will be consulted and continue current level of intensive care treatment.  Pertinent  Medical History   Past Medical History:  Diagnosis Date   Anemia    Atrial fibrillation ([place]) 06/17/2019   Cataract, bilateral    Chronic combined systolic and diastolic CHF (congestive heart failure) ([place])    a. EF 40-45% in 2019 b. EF 25-30% by repeat echo in 01/2019   CKD (chronic kidney disease), stage IV ([facility])    Coronary artery disease 08/2017   DES x 2 RCA   Dialysis patient ([place]) 07/2019   DKA (diabetic ketoacidoses) 06/16/2019   GERD (gastroesophageal reflux disease)    Glaucoma, left eye    Heart murmur    Hyperlipidemia    Hypertension    Proliferative diabetic retinopathy ([place])    left eye with vitreous hemorrhage and tractional retinal detachment   Stroke ([place])  09/2014   numbness left upper lip, finger tips on left hand; "resolved" (05/18/2016)   Type II diabetes mellitus ([place])    Wears glasses      Significant Hospital Events: Including procedures, antibiotic start and stop dates in addition to other pertinent events   04/09/2021 intubated on the floor for anesthesia  Interim History / Subjective:   MRI brain with multiple small infarcts in left ACA, PICA territories suggestive of embolic events. Started on CRRT.   Objective   Blood pressure (!) 138/55, pulse (!) 48, temperature (!) 97.5 F (36.4 C), temperature source Oral, resp. rate 18, height '5\' 7"'$  (1.702 m), weight 68.7 kg, SpO2 100 %.    Vent Mode: PRVC FiO2 (%):  [40 %-100 %] 40 % Set Rate:  [14 bmp-18 bmp] 18 bmp Vt Set:  [490 mL] 490 mL PEEP:  [5 cmH20] 5 cmH20 Plateau Pressure:  [17 cmH20-20 cmH20] 19 cmH20   Intake/Output Summary (Last 24 hours) at 04/10/2021 0727 Last data filed at 04/10/2021 0700 Gross per 24 hour  Intake 538.16 ml  Output 866 ml  Net -327.84 ml   Filed Weights   04/09/21 0405 04/09/21 0903 04/10/21 0500  Weight: 69.5 kg 70.7 kg 68.7 kg    Examination: General appearance: 65 y.o., female, intubated, sedated Eyes: anicteric sclerae, moist conjunctivae; no lid-lag HENT: NCAT; dry MM Neck: Trachea midline; no lymphadenopathy, no JVD Lungs: mechanical breath sounds, no crackles, no wheeze, with normal respiratory effort CV: RRR, no MRGs  Abdomen: Soft, non-tender;  non-distended, BS present, ?mild RUQ pain Extremities: traceedema, radial and DP pulses present bilaterally  Skin: Normal temperature, turgor and texture; no rash Neuro: opens eyes and seems to track to me when I'm on her left, does not follow commands, withdraws LUE to pain.   WBC 18 CRP 11 Sed rate wnl   MRI brain with multiple small infarcts in left ACA, PICA territories suggestive of embolic events  CXR reviewed by me and was unremarkable   Resolved Hospital Problem list      Assessment & Plan:  Ventilator dependent respiratory failure in the setting of altered mental status, bradycardia, metabolic disarray, non-ST elevation MI with troponins 14,000 questionable seizure activity left arm. Full vent support Keep sedation light  Altered mental status with questionable left arm seizure activity. New strokes but I'm unsure if this fully explains her acute on chronic neurologic deterioration. Also has recently has worsening AGMA that seems out of proportion to her mildly elevated BUN. Developing leukocytosis which raises possibility of infectious process but BCx negative, CXR clear, she has no concerning secretions, she has been normothermic and she is anuric at baseline and I'm not sure I'd rely on the results of a UA/UCx in that setting. MRA head  cEEG been ordered Neuro following  Non-ST elevation MI troponin is noted to be 14,000.  Unable do cardiac cath due to altered mental status and metabolic disarray patient refused procedure ,Atrial fib.  History of congestive heart failure Cardiology following, cath on hold pending further workup of encephalopathy   Diabetes mellitus Sliding scale insulin  End-stage renal disease Tunneled HD catheter is in place New left AV graft placed on 04/03/2021 Nephrology is following CRRT she has tunneled cath, pull UF  Elevated liver enzymes Monitor. INR elevated, ammonia elevated, ?developing acute liver failure.  F/u US doppler Involve GI if persistent/worsening transaminitis and doppler unrevealing   Leukocytosis ?stress response.  ctm    Best Practice (right click and "Reselect all SmartList Selections" daily)   Diet/type: NPO DVT prophylaxis: systemic heparin GI prophylaxis: PPI Lines: Central line Foley:  Yes, and it is still needed Code Status:  full code Last date of multidisciplinary goals of care discussion [TBD]   Critical care time: 40 minutes    This patient is critically ill with multiple  organ system failure; which, requires frequent high complexity decision making, assessment, support, evaluation, and titration of therapies. This was completed through the application of advanced monitoring technologies and extensive interpretation of multiple databases. During this encounter critical care time was devoted to patient care services described in this note for 40 minutes.   Fredirick Maudlin Pulmonary/Critical Care  04/10/2021, 7:27 AM

## 2021-04-10 NOTE — Progress Notes (Signed)
Progress Note  Patient Name: Kasumi Ditullio Date of Encounter: 04/10/2021  Primary Cardiologist: Skeet Latch, MD  Subjective   Remains intubated, sedated. Remains critically ill. Was sinus brady in the 40s-50s majority of yesterday; this AM reverted to rapid atrial fib in the 100s-120s, currently around 110bpm. Lactate high. WBC rising. Continuous EEG underway.  Inpatient Medications    Scheduled Meds:  aspirin  81 mg Per Tube Daily   B-complex with vitamin C  1 tablet Per Tube Daily   chlorhexidine gluconate (MEDLINE KIT)  15 mL Mouth Rinse BID   Chlorhexidine Gluconate Cloth  6 each Topical Daily   docusate  100 mg Per Tube BID   famotidine  10 mg Per Tube Daily   feeding supplement (PROSource TF)  45 mL Per Tube TID   insulin aspart  0-5 Units Subcutaneous QHS   insulin aspart  0-6 Units Subcutaneous TID WC   mouth rinse  15 mL Mouth Rinse 10 times per day   polyethylene glycol  17 g Per Tube Daily   sodium chloride flush  3 mL Intravenous Q12H   Continuous Infusions:  feeding supplement (VITAL 1.5 CAL) 1,000 mL (04/09/21 2000)   prismasol BGK 2/2.5 dialysis solution 2,000 mL/hr at 04/10/21 0726   prismasol BGK 2/2.5 replacement solution 500 mL/hr at 04/10/21 0725   prismasol BGK 2/2.5 replacement solution 500 mL/hr at 04/09/21 1903   PRN Meds: alteplase, fentaNYL (SUBLIMAZE) injection, fentaNYL (SUBLIMAZE) injection, heparin, midazolam, midazolam, sodium chloride   Vital Signs    Vitals:   04/10/21 0500 04/10/21 0600 04/10/21 0700 04/10/21 0818  BP: 126/70 (!) 126/51 (!) 138/55 (!) 88/65  Pulse: (!) 48 (!) 47 (!) 48 (!) 123  Resp: '18 18 18 18  ' Temp:   (!) 97.5 F (36.4 C)   TempSrc:   Oral   SpO2: 100% 100% 100% 100%  Weight: 68.7 kg     Height:        Intake/Output Summary (Last 24 hours) at 04/10/2021 0903 Last data filed at 04/10/2021 0858 Gross per 24 hour  Intake 523.98 ml  Output 867 ml  Net -343.02 ml   Last 3 Weights 04/10/2021 04/09/2021  04/09/2021  Weight (lbs) 151 lb 7.3 oz 155 lb 13.8 oz 153 lb 3.5 oz  Weight (kg) 68.7 kg 70.7 kg 69.5 kg     Telemetry / EKG    SB -> afib RVR - Personally Reviewed  EKG shows SB 48bpm LVH changes with diffuse TW changes (diffuse TW changes noted before as well), QTc remains elevated 510m   Physical Exam   GEN: No acute distress. On vent HEENT: Normocephalic, atraumatic, sclera non-icteric. Neck: No JVD or bruits. Cardiac: Irregularly irregular, rate elevated no murmurs, rubs, or gallops.  Respiratory: Coarse mechanical BS, no wheezing or rhonchi GI: Soft, nontender, non-distended, BS +x 4. MS: no acute deformity. Extremities: No clubbing or cyanosis. No edema. Distal pedal pulses are 2+ and equal bilaterally. Neuro:  Sedated, occasionally makes spontaneous movements turning head Psych:  unable to assess  Labs    High Sensitivity Troponin:   Recent Labs  Lab 03/15/2021 0806 04/04/2021 0930 03/25/2021 1442 03/14/2021 1830 03/21/2021 0454  TROPONINIHS 1,320* 1,605* 2,745* 5,564* 14,294*      Cardiac EnzymesNo results for input(s): TROPONINI in the last 168 hours. No results for input(s): TROPIPOC in the last 168 hours.   Chemistry Recent Labs  Lab 03/17/2021 0454 04/09/21 0248 04/09/21 0930 04/09/21 1450 04/10/21 0500  NA 136 137 132* 136 137  K 5.7* 5.5* 6.0* 6.0* 4.0  CL 96* 98  --  95* 100  CO2 18* 17*  --  17* 21*  GLUCOSE 85 105*  --  158* 99  BUN 78* 34*  --  47* 39*  CREATININE 9.62* 5.43*  --  6.22* 4.72*  CALCIUM 9.1 9.0  --  9.2 8.8*  PROT 6.2* 6.7  --   --  6.2*  ALBUMIN 3.0* 3.2*  --  3.2* 3.0*  2.9*  AST 1,077* 1,363*  --   --  PENDING  ALT 212* 372*  --   --  PENDING  ALKPHOS 73 115  --   --  119  BILITOT 1.3* 1.5*  --   --  2.2*  GFRNONAA 4* 8*  --  7* 10*  ANIONGAP 22* 22*  --  24* 16*     Hematology Recent Labs  Lab 04/02/2021 0454 04/09/21 0248 04/09/21 0930 04/10/21 0500  WBC 8.5 12.2*  --  18.3*  RBC 3.54* 4.13  --  3.63*  HGB 9.6* 11.2*  10.5* 10.0*  HCT 30.0* 34.5* 31.0* 29.9*  MCV 84.7 83.5  --  82.4  MCH 27.1 27.1  --  27.5  MCHC 32.0 32.5  --  33.4  RDW 16.4* 16.9*  --  16.7*  PLT 256 297  --  246    BNPNo results for input(s): BNP, PROBNP in the last 168 hours.   DDimer No results for input(s): DDIMER in the last 168 hours.   Radiology    CT HEAD WO CONTRAST (5MM)  Result Date: 04/09/2021 CLINICAL DATA:  Mental status change, unknown cause. Intubated and unresponsive. EXAM: CT HEAD WITHOUT CONTRAST TECHNIQUE: Contiguous axial images were obtained from the base of the skull through the vertex without intravenous contrast. COMPARISON:  Prior head CT examinations 04/04/2021 and earlier. Brain MRI 06/17/2019. FINDINGS: Brain: Mild generalized cerebral and cerebellar atrophy. There are small infarcts within the bilateral cerebellar hemispheres which are new as compared to the brain MRI of 06/17/2019, but otherwise age-indeterminate. Redemonstrated chronic lacunar infarcts versus prominent perivascular spaces within the bilateral basal ganglia (series 2, image 16). In retrospect, these infarcts were present on the head CT head 04/02/2021. Redemonstrated chronic lacunar infarct within the right thalamus. Moderate patchy and ill-defined hypoattenuation within the cerebral white matter, nonspecific but compatible with chronic small vessel ischemic disease. There is no acute intracranial hemorrhage. No demarcated cortical infarct. No extra-axial fluid collection. No evidence of an intracranial mass. No midline shift. Vascular: No hyperdense vessel.  Atherosclerotic calcifications. Skull: Normal. Negative for fracture or focal lesion. Sinuses/Orbits: Visualized orbits show no acute finding. No significant paranasal sinus disease at the imaged levels. IMPRESSION: Small infarcts within the bilateral cerebellar hemispheres are new as compared to the brain MRI of 06/17/2019, but otherwise age-indeterminate. Consider a brain MRI for further  evaluation. Redemonstrated chronic lacunar infarcts versus prominent perivascular spaces within bilateral basal ganglia. Redemonstrated chronic lacunar infarct within the right thalamus. Moderate chronic small vessel ischemic changes within cerebral white matter. Mild generalized parenchymal atrophy. Electronically Signed   By: Kellie Simmering D.O.   On: 04/09/2021 08:50   MR BRAIN WO CONTRAST  Result Date: 04/09/2021 CLINICAL DATA:  Mental status change, unknown cause. EXAM: MRI HEAD WITHOUT CONTRAST TECHNIQUE: Multiplanar, multiecho pulse sequences of the brain and surrounding structures were obtained without intravenous contrast. COMPARISON:  Head CT April 09, 2021 FINDINGS: The study is partially degraded by motion. Brain: A 9 mm focus of restricted diffusion is seen  in the left cerebellar hemisphere. Scattered foci of restricted diffusion are also seen within the deep white matter of the left frontal lobe, as well as within the medial left frontal parietal cortex. No hemorrhage, hydrocephalus, extra-axial collection or mass lesion. Remote lacunar infarcts in the bilateral cerebellar hemispheres and right thalamus. Scattered and confluent foci of T2 hyperintensity are seen within the white matter of the cerebral hemispheres, nonspecific, most likely related to chronic small vessel ischemia. Moderate parenchymal volume loss. Focus of susceptibility artifact in the left amygdala suggesting hemosiderin deposit. Vascular: Normal flow voids. Skull and upper cervical spine: Normal marrow signal. Sinuses/Orbits: Negative. Other: None. IMPRESSION: 1. Multiple small acute/subacute infarcts in the left ACA and left PICA territories, suggesting embolic events. 2. Moderate chronic microvascular ischemic changes of the white matter and parenchymal. Electronically Signed   By: Pedro Earls M.D.   On: 04/09/2021 17:04   CARDIAC CATHETERIZATION  Result Date: 03/26/2021 Procedure was canceled at the  patient's request   DG CHEST PORT 1 VIEW  Result Date: 04/09/2021 CLINICAL DATA:  History of heart failure, intubated EXAM: PORTABLE CHEST 1 VIEW COMPARISON:  Chest radiograph 04/03/2021 FINDINGS: The endotracheal tube terminates approximately 1.7 cm from the carina. The left dialysis catheter terminates in the right atrium. Defibrillator pads and additional leads overlie the patient. The cardiomediastinal silhouette is stable, with unchanged cardiomegaly. There is no focal consolidation or pulmonary edema. There is no pleural effusion or pneumothorax. The bones are stable. IMPRESSION: 1. Endotracheal tube terminates approximately 1.7 cm from the carina. 2. Unchanged cardiomegaly. 3. No radiographic evidence of acute cardiopulmonary process. Electronically Signed   By: Valetta Mole M.D.   On: 04/09/2021 10:31   DG Abd Portable 1V  Result Date: 04/09/2021 CLINICAL DATA:  Feeding tube placement EXAM: PORTABLE ABDOMEN - 1 VIEW COMPARISON:  CT scan 03/16/2021 FINDINGS: Feeding tube extends into the stomach and terminates in the gastric body. There is some residual contrast medium in the transverse colon. Extensive atherosclerotic vascular calcifications noted. IMPRESSION: 1. The feeding tube tip is in the stomach body. 2. Atherosclerosis. Electronically Signed   By: Van Clines M.D.   On: 04/09/2021 18:23   EEG adult  Result Date: 04/09/2021 Lora Havens, MD     04/09/2021 11:32 AM Patient Name: Asyria Kolander MRN: [id number] Epilepsy Attending: Lora Havens Referring Physician/Provider: Dr Su Monks Date: 04/09/2021 Duration: 23.17 mins Patient history: 65yo F with altered mental status with questionable left arm seizure activity. EEG to evaluate for seizure Level of alertness: Awake AEDs during EEG study: None Technical aspects: This EEG study was done with scalp electrodes positioned according to the 10-20 International system of electrode placement. Electrical activity was acquired at  a sampling rate of '500Hz'  and reviewed with a high frequency filter of '70Hz'  and a low frequency filter of '1Hz' . EEG data were recorded continuously and digitally stored. Description: EEG showed continuous generalized 3 to 6 Hz theta-delta slowing, at times with triphasic morphology.  Hyperventilation and photic stimulation were not performed.   ABNORMALITY - Continuous slow, generalized IMPRESSION: This study is suggestive of moderate diffuse encephalopathy, nonspecific etiology. No seizures or epileptiform discharges were seen throughout the recording. Lora Havens   ECHOCARDIOGRAM COMPLETE  Result Date: 04/09/2021    ECHOCARDIOGRAM REPORT   Patient Name:   LOUANN HOPSON Date of Exam: 04/09/2021 Medical Rec #:  [id number]        Height:       67.0 in Accession #:  [id number]       Weight:       155.9 lb Date of Birth:  Nov 28, 1955        BSA:          1.819 m Patient Age:    27 years         BP:           103/67 mmHg Patient Gender: F                HR:           49 bpm. Exam Location:  Inpatient Procedure: 2D Echo, Cardiac Doppler, Color Doppler and Intracardiac            Opacification Agent Indications:    122-I22.9 Subsequent ST elevation (STEM) and non-ST elevation                 (NSTEMI) myocardial infarction  History:        Patient has prior history of Echocardiogram examinations, most                 recent 07/01/2019. Cardiomyopathy, Previous Myocardial                 Infarction, Acute MI and CAD, Abnormal ECG, Stroke,                 Arrythmias:Atrial Fibrillation,                 Signs/Symptoms:Dizziness/Lightheadedness and Dyspnea; Risk                 Factors:Hypertension. ESRD.  Sonographer:    Roseanna Rainbow RDCS Referring Phys: [id number] [place]  1. Left ventricular ejection fraction, by estimation, is 25 to 30%. The left ventricle has severely decreased function. The left ventricle demonstrates global hypokinesis. There is moderate left ventricular hypertrophy. Left  ventricular diastolic function could not be evaluated. Elevated left ventricular end-diastolic pressure. There is akinesis of the left ventricular, mid-apical septal wall.  2. Right ventricular systolic function is mildly reduced. The right ventricular size is normal.  3. Left atrial size was moderately dilated.  4. Right atrial size was mildly dilated.  5. The mitral valve is abnormal. Mild mitral valve regurgitation. No evidence of mitral stenosis. Moderate to severe mitral annular calcification.  6. The aortic valve is grossly normal. There is mild calcification of the aortic valve. There is mild thickening of the aortic valve. Aortic valve regurgitation is not visualized. Mild to moderate aortic valve sclerosis/calcification is present, without  any evidence of aortic stenosis. Comparison(s): Prior images reviewed side by side. Changes from prior study are noted. Conclusion(s)/Recommendation(s): Rhythm changed between sinus bradycardia and rate controlled atrial fibrillation during study. Overall, compared to prior the pattern of wall motion abnormalities is similar, but they are more pronounced on current study,  with slight decrease in EF from prior. Findings communicated with NP Reino Bellis. FINDINGS  Left Ventricle: Global hypokinesis with focal akinesis of the mid to distal anterior and inferior septum. Left ventricular ejection fraction, by estimation, is 25 to 30%. The left ventricle has severely decreased function. The left ventricle demonstrates global hypokinesis. The left ventricular internal cavity size was normal in size. There is moderate left ventricular hypertrophy. Left ventricular diastolic function could not be evaluated due to atrial fibrillation. Left ventricular diastolic function could not be evaluated. Elevated left ventricular end-diastolic pressure. Right Ventricle: The right ventricular size is normal. Right vetricular wall thickness was not well visualized.  Right ventricular  systolic function is mildly reduced. Left Atrium: Left atrial size was moderately dilated. Right Atrium: Right atrial size was mildly dilated. Pericardium: There is no evidence of pericardial effusion. Mitral Valve: The mitral valve is abnormal. There is mild thickening of the mitral valve leaflet(s). There is mild calcification of the mitral valve leaflet(s). Moderate to severe mitral annular calcification. Mild mitral valve regurgitation. No evidence  of mitral valve stenosis. Tricuspid Valve: The tricuspid valve is normal in structure. Tricuspid valve regurgitation is trivial. No evidence of tricuspid stenosis. Aortic Valve: The aortic valve is grossly normal. There is mild calcification of the aortic valve. There is mild thickening of the aortic valve. There is moderate aortic valve annular calcification. Aortic valve regurgitation is not visualized. Mild to moderate aortic valve sclerosis/calcification is present, without any evidence of aortic stenosis. Pulmonic Valve: The pulmonic valve was not well visualized. Pulmonic valve regurgitation is not visualized. No evidence of pulmonic stenosis. Aorta: The aortic root, ascending aorta and aortic arch are all structurally normal, with no evidence of dilitation or obstruction. Venous: IVC assessment for right atrial pressure unable to be performed due to mechanical ventilation. IAS/Shunts: The interatrial septum appears to be lipomatous. The atrial septum is grossly normal.  LEFT VENTRICLE PLAX 2D LVIDd:         4.80 cm      Diastology LVIDs:         4.57 cm      LV e' medial:    2.13 cm/s LV PW:         1.80 cm      LV E/e' medial:  46.1 LV IVS:        1.20 cm      LV e' lateral:   2.13 cm/s LVOT diam:     1.90 cm      LV E/e' lateral: 46.1 LV SV:         40 LV SV Index:   22 LVOT Area:     2.84 cm  LV Volumes (MOD) LV vol d, MOD A2C: 97.9 ml LV vol d, MOD A4C: 115.3 ml LV vol s, MOD A2C: 65.4 ml LV vol s, MOD A4C: 69.9 ml LV SV MOD A2C:     32.5 ml LV SV MOD  A4C:     115.3 ml LV SV MOD BP:      39.0 ml RIGHT VENTRICLE            IVC RV S prime:     5.70 cm/s  IVC diam: 1.90 cm TAPSE (M-mode): 1.5 cm LEFT ATRIUM             Index       RIGHT ATRIUM           Index LA diam:        3.40 cm 1.87 cm/m  RA Area:     15.70 cm LA Vol (A2C):   69.9 ml 38.43 ml/m RA Volume:   44.20 ml  24.30 ml/m LA Vol (A4C):   44.6 ml 24.52 ml/m LA Biplane Vol: 56.4 ml 31.01 ml/m  AORTIC VALVE LVOT Vmax:   70.70 cm/s LVOT Vmean:  39.200 cm/s LVOT VTI:    0.140 m  AORTA Ao Root diam: 3.10 cm Ao Asc diam:  3.00 cm MITRAL VALVE MV Area (PHT): 3.31 cm    SHUNTS MV Decel Time: 229 msec    Systemic VTI:  0.14 m MV E velocity: 98.10 cm/s  Systemic Diam: 1.90 cm  Buford Dresser MD Electronically signed by Buford Dresser MD Signature Date/Time: 04/09/2021/6:24:41 PM    Final    US LIVER DOPPLER  Result Date: 04/10/2021 CLINICAL DATA:  Transaminitis EXAM: DUPLEX ULTRASOUND OF LIVER TECHNIQUE: Color and duplex Doppler ultrasound was performed to evaluate the hepatic in-flow and out-flow vessels. COMPARISON:  CT 04/02/2021 FINDINGS: Liver: Normal parenchymal echogenicity. Normal hepatic contour without nodularity. No focal lesion, mass or intrahepatic biliary ductal dilatation. Main Portal Vein size: 0.9 cm Portal Vein Velocities (all hepatopetal): Main Prox:  20 cm/sec Main Mid: 14 cm/sec Main Dist:  11 cm/sec Right: 9 cm/sec Left: 9 cm/sec Hepatic Vein Velocities (all hepatofugal): Right:  9 cm/sec Middle:  10 cm/sec Left:  15 cm/sec IVC: Present and patent with normal respiratory phasicity. Hepatic Artery Velocity:  106 cm/sec Splenic Vein Velocity:  12 cm/sec Spleen: 8.8 cm x 3.9 cm x 3.5 cm with a total volume of 62 cm^3 (411 cm^3 is upper limit normal) Portal Vein Occlusion/Thrombus: No Splenic Vein Occlusion/Thrombus: No Ascites: None Varices: None IMPRESSION: Unremarkable hepatic vascular Doppler evaluation. Electronically Signed   By: Lucrezia Europe M.D.   On: 04/10/2021 08:12    US ABDOMEN LIMITED RUQ (LIVER/GB)  Result Date: 03/28/2021 CLINICAL DATA:  Elevated liver function tests. EXAM: ULTRASOUND ABDOMEN LIMITED RIGHT UPPER QUADRANT COMPARISON:  April 07, 2021. FINDINGS: Gallbladder: Sludge and cholelithiasis is noted. No gallbladder wall thickening or pericholecystic fluid is noted. No sonographic Murphy's sign is noted. Common bile duct: Diameter: 4 mm which is within normal limits. Liver: No focal lesion identified. Within normal limits in parenchymal echogenicity. Portal vein is patent on color Doppler imaging with normal direction of blood flow towards the liver. Other: Right renal cyst is noted. IMPRESSION: Sludge and cholelithiasis is noted. No other significant abnormality seen in the right upper quadrant of the abdomen. Electronically Signed   By: Marijo Conception M.D.   On: 03/16/2021 19:57    Cardiac Studies   2D echo 04/09/21  1. Left ventricular ejection fraction, by estimation, is 25 to 30%. The  left ventricle has severely decreased function. The left ventricle  demonstrates global hypokinesis. There is moderate left ventricular  hypertrophy. Left ventricular diastolic  function could not be evaluated. Elevated left ventricular end-diastolic  pressure. There is akinesis of the left ventricular, mid-apical septal  wall.   2. Right ventricular systolic function is mildly reduced. The right  ventricular size is normal.   3. Left atrial size was moderately dilated.   4. Right atrial size was mildly dilated.   5. The mitral valve is abnormal. Mild mitral valve regurgitation. No  evidence of mitral stenosis. Moderate to severe mitral annular  calcification.   6. The aortic valve is grossly normal. There is mild calcification of the  aortic valve. There is mild thickening of the aortic valve. Aortic valve  regurgitation is not visualized. Mild to moderate aortic valve  sclerosis/calcification is present, without   any evidence of aortic stenosis.    Comparison(s): Prior images reviewed side by side. Changes from prior  study are noted.   Conclusion(s)/Recommendation(s): Rhythm changed between sinus bradycardia  and rate controlled atrial fibrillation during study. Overall, compared to  prior the pattern of wall motion abnormalities is similar, but they are  more pronounced on current study,   with slight decrease in EF from prior. Findings communicated with NP  Reino Bellis.   Patient Profile     65 y.o. female with CAD S/p LAD PCI in 2017 and overlapping  DES in 2019, PAF, chronic combined CHF, HTN, HLD, prior stroke, ESRD presented to the hospital 03/29/2021 with complaints of weakness, decreased appetite and feeling poorly. She had recently undergone AV Graft placement 04/03/21. She had missed HD the day of admission due to feeling bad. She was also noted to be sleepy with altered mental status. INR was subtherapeutic on admission. This has waxed and waned during admission. Other findings notable for significantly elevated procalcitonin, transaminitis, hyperkalemia. UA attempted 8/30 with milky white pus in catheter. She was found to have troponin elevated to 14k, therefore cardiology following for NSTEMI. Cath attempted but aborted due to AMS and inability to comply with instructions. Also developed PAF RVR during admission.  Per husband patient has had 4 months of waxing/waning mental status but this has been acutely worse. Yesterday she developed decreased responsiveness and seizurelike behavior in her left arm. She also had bradycardia in the upper 30s-low 40s requiring cessation of amiodarone and carvedilol. MRI brain showed Multiple small acute/subacute infarcts in the left ACA and left PICA territories, suggesting embolic events   Assessment & Plan    1. Constellation of symptoms including diffuse myalgias, altered mental status/acute encephalopathy superimposed on months of waxing/waning mental status, transaminitis, elevated  procalcitonin, then worsening decompensation with seizure like activity on 8/31 - some features concerning for infective process, no clear source identified yet - initial blood cultures negative, we will repeat today - nurse note 8/30 stated when I/O catheter was obtained, no urine, only milky white pus obtained in the catheter - brought to PCCM APP attention yesterday to ensure critical care aware - procalcitonin peak 4.30, last assessed 8/31 at 3.85  - suggest further infection workup at this time as she seems to be developing sepsis-like physiology with tachycardia, hypotension, elevated lactate - Dr. Audie Box plans to discuss with PCCM, may need pressor support   2. NSTEMI, prior hx of CAD - suspect this is a secondary process - NSTEMI would not specifically be the only factor driving the concern for infection or encephalopathy, but certainly confounding her admission and increasing prognostic concern - cath was deferred 83/0/22 due to AMS/inability to comply with directions - 2D echo shows slight decline in LVEF from prior but per reader, not significantly changed in terms of WMA - per neuro, OK to continue ASA/heparin - hold BB, nitrate due to hypotension (also bradycardia) - avoid statin due to transaminitis   3. Chronic combined CHF - volume managed by HD - 2D echo EF 25-30%, global HK with akinesis of the left ventricular, mid-apical septal wall, mildly reduced RVF, mild MR -> Overall, compared to prior the pattern of wall motion abnormalities is similar, but they are  more pronounced on current study with slight decrease in EF from prior - cannot pursue GDMT due to hypotension   4. Paroxysmal atrial fib with recurrence while admitted, along with sinus bradycardia this admission - on warfarin prior to admission with subtherapeutic INR on admission (?contribution to strokes), has been on IV heparin per pharmacy - continues to vacillate between SB and AF with mild RVR - avoid  amiodarone/BB at this time due to bradycardia; tachycardia is likely physiologic in context of underlying medical illness   5. Transaminitis - per medical team, may need GI/ID w/u given concern for infection issues - LFTs pending  6. New acute/subacute stroke - per neurology   7. ESRD on HD with persistent hyperkalemia - per nephrology   8. Prolonged QTc - QTc remains 555m this AM -  follow while inpatient, avoid QT prolonging agents - lyte mgmt per nephrology - d/c zofran  For questions or updates, please contact [facility] Please consult [email or web address] for contact info under Cardiology/STEMI.  Signed, Charlie Pitter, PA-C 04/10/2021, 9:03 AM

## 2021-04-10 DEATH — deceased

## 2021-04-11 ENCOUNTER — Inpatient Hospital Stay (HOSPITAL_COMMUNITY): Payer: Medicare Other

## 2021-04-11 DIAGNOSIS — I214 Non-ST elevation (NSTEMI) myocardial infarction: Secondary | ICD-10-CM | POA: Diagnosis not present

## 2021-04-11 LAB — CBC
HCT: 32.1 % — ABNORMAL LOW (ref 36.0–46.0)
Hemoglobin: 10.5 g/dL — ABNORMAL LOW (ref 12.0–15.0)
MCH: 27.8 pg (ref 26.0–34.0)
MCHC: 32.7 g/dL (ref 30.0–36.0)
MCV: 84.9 fL (ref 80.0–100.0)
Platelets: 250 10*3/uL (ref 150–400)
RBC: 3.78 MIL/uL — ABNORMAL LOW (ref 3.87–5.11)
RDW: 17.4 % — ABNORMAL HIGH (ref 11.5–15.5)
WBC: 20 10*3/uL — ABNORMAL HIGH (ref 4.0–10.5)
nRBC: 14.4 % — ABNORMAL HIGH (ref 0.0–0.2)

## 2021-04-11 LAB — MAGNESIUM: Magnesium: 2.6 mg/dL — ABNORMAL HIGH (ref 1.7–2.4)

## 2021-04-11 LAB — GLUCOSE, CAPILLARY
Glucose-Capillary: 175 mg/dL — ABNORMAL HIGH (ref 70–99)
Glucose-Capillary: 226 mg/dL — ABNORMAL HIGH (ref 70–99)
Glucose-Capillary: 210 mg/dL — ABNORMAL HIGH (ref 70–99)
Glucose-Capillary: 236 mg/dL — ABNORMAL HIGH (ref 70–99)
Glucose-Capillary: 211 mg/dL — ABNORMAL HIGH (ref 70–99)
Glucose-Capillary: 195 mg/dL — ABNORMAL HIGH (ref 70–99)

## 2021-04-11 LAB — PROTIME-INR
INR: 3.1 — ABNORMAL HIGH (ref 0.8–1.2)
Prothrombin Time: 32 seconds — ABNORMAL HIGH (ref 11.4–15.2)

## 2021-04-11 LAB — RENAL FUNCTION PANEL
Albumin: 2.4 g/dL — ABNORMAL LOW (ref 3.5–5.0)
Anion gap: 12 (ref 5–15)
BUN: 17 mg/dL (ref 8–23)
CO2: 21 mmol/L — ABNORMAL LOW (ref 22–32)
Calcium: 8.7 mg/dL — ABNORMAL LOW (ref 8.9–10.3)
Chloride: 103 mmol/L (ref 98–111)
Creatinine, Ser: 1.49 mg/dL — ABNORMAL HIGH (ref 0.44–1.00)
GFR, Estimated: 39 mL/min — ABNORMAL LOW (ref >60)
Glucose, Bld: 223 mg/dL — ABNORMAL HIGH (ref 70–99)
Phosphorus: 2.2 mg/dL — ABNORMAL LOW (ref 2.5–4.6)
Potassium: 3.7 mmol/L (ref 3.5–5.1)
Sodium: 136 mmol/L (ref 135–145)

## 2021-04-11 LAB — BLOOD CULTURE ID PANEL (REFLEXED) - BCID2

## 2021-04-11 LAB — GLOBAL TEG PANEL
CFF Max Amplitude: 25.2 mm (ref 15–32)
CK with Heparinase (R): 7.3 min (ref 4.3–8.3)
Citrated Functional Fibrinogen: 459.9 mg/dL (ref 278–581)
Citrated Kaolin (K): 1.1 min (ref 0.8–2.1)
Citrated Kaolin (MA): 66.7 mm (ref 52–69)
Citrated Kaolin (R): 6.7 min (ref 4.6–9.1)
Citrated Kaolin Angle: 74.5 deg (ref 63–78)
Citrated Rapid TEG (MA): 66.9 mm (ref 52–70)

## 2021-04-11 LAB — COMPREHENSIVE METABOLIC PANEL
ALT: 1923 U/L — ABNORMAL HIGH (ref 0–44)
AST: 4440 U/L — ABNORMAL HIGH (ref 15–41)
Albumin: 2.5 g/dL — ABNORMAL LOW (ref 3.5–5.0)
Alkaline Phosphatase: 151 U/L — ABNORMAL HIGH (ref 38–126)
Anion gap: 12 (ref 5–15)
BUN: 17 mg/dL (ref 8–23)
CO2: 21 mmol/L — ABNORMAL LOW (ref 22–32)
Calcium: 8.7 mg/dL — ABNORMAL LOW (ref 8.9–10.3)
Chloride: 100 mmol/L (ref 98–111)
Creatinine, Ser: 1.68 mg/dL — ABNORMAL HIGH (ref 0.44–1.00)
GFR, Estimated: 34 mL/min — ABNORMAL LOW (ref >60)
Glucose, Bld: 223 mg/dL — ABNORMAL HIGH (ref 70–99)
Potassium: 3.8 mmol/L (ref 3.5–5.1)
Sodium: 133 mmol/L — ABNORMAL LOW (ref 135–145)
Total Bilirubin: 2.3 mg/dL — ABNORMAL HIGH (ref 0.3–1.2)
Total Protein: 6.1 g/dL — ABNORMAL LOW (ref 6.5–8.1)

## 2021-04-11 LAB — LACTIC ACID, PLASMA
Lactic Acid, Venous: 3.5 mmol/L (ref 0.5–1.9)
Lactic Acid, Venous: 3.7 mmol/L (ref 0.5–1.9)

## 2021-04-11 LAB — FIBRINOGEN: Fibrinogen: 273 mg/dL (ref 210–475)

## 2021-04-11 LAB — ANA W/REFLEX IF POSITIVE: Anti Nuclear Antibody (ANA): NEGATIVE

## 2021-04-11 LAB — COOXEMETRY PANEL
Carboxyhemoglobin: 1.1 % (ref 0.5–1.5)
Methemoglobin: 1.1 % (ref 0.0–1.5)
O2 Saturation: 56.3 %
Total hemoglobin: 10.4 g/dL — ABNORMAL LOW (ref 12.0–16.0)

## 2021-04-11 LAB — CORTISOL-AM, BLOOD: Cortisol - AM: 28.1 ug/dL — ABNORMAL HIGH (ref 6.7–22.6)

## 2021-04-11 LAB — CMV IGM: CMV IgM: <30 AU/mL (ref 0.0–29.9)

## 2021-04-11 LAB — EPSTEIN-BARR VIRUS VCA, IGM: EBV VCA IgM: <36 U/mL (ref 0.0–35.9)

## 2021-04-11 MED ORDER — HEPARIN (PORCINE) 25000 UT/250ML-% IV SOLN
750.0000 [IU]/h | INTRAVENOUS | Status: DC
  Administered 2021-04-11: 500 [IU]/h via INTRAVENOUS
  Administered 2021-04-13: 1000 [IU]/h via INTRAVENOUS
  Administered 2021-04-14: 750 [IU]/h via INTRAVENOUS
  Filled 2021-04-11 (×3): qty 250

## 2021-04-11 MED ORDER — SODIUM CHLORIDE 0.9% FLUSH
10.0000 mL | Freq: Two times a day (BID) | INTRAVENOUS | Status: DC
  Administered 2021-04-11 – 2021-04-14 (×5): 10 mL

## 2021-04-11 MED ORDER — INSULIN ASPART 100 UNIT/ML IJ SOLN
0.0000 [IU] | INTRAMUSCULAR | Status: DC
Start: 2021-04-11 — End: 2021-04-15
  Administered 2021-04-11: 3 [IU] via SUBCUTANEOUS
  Administered 2021-04-11 (×2): 5 [IU] via SUBCUTANEOUS
  Administered 2021-04-11: 3 [IU] via SUBCUTANEOUS
  Administered 2021-04-12 (×4): 5 [IU] via SUBCUTANEOUS
  Administered 2021-04-12: 8 [IU] via SUBCUTANEOUS
  Administered 2021-04-12: 5 [IU] via SUBCUTANEOUS
  Administered 2021-04-13: 2 [IU] via SUBCUTANEOUS
  Administered 2021-04-13: 3 [IU] via SUBCUTANEOUS
  Administered 2021-04-13 (×2): 8 [IU] via SUBCUTANEOUS
  Administered 2021-04-13: 3 [IU] via SUBCUTANEOUS
  Administered 2021-04-13 – 2021-04-14 (×2): 5 [IU] via SUBCUTANEOUS
  Administered 2021-04-14: 2 [IU] via SUBCUTANEOUS

## 2021-04-11 MED ORDER — SODIUM CHLORIDE 0.9% FLUSH: 10.0000 mL | INTRAVENOUS | Status: DC | PRN

## 2021-04-11 MED ORDER — SODIUM CHLORIDE 0.9 % IV SOLN
2.0000 g | Freq: Every day | INTRAVENOUS | Status: DC
  Administered 2021-04-11 – 2021-04-13 (×3): 2 g via INTRAVENOUS
  Filled 2021-04-11 (×3): qty 20

## 2021-04-11 MED ORDER — LACTULOSE 10 GM/15ML PO SOLN
10.0000 g | Freq: Three times a day (TID) | ORAL | Status: DC
  Administered 2021-04-11 (×3): 10 g
  Filled 2021-04-11 (×3): qty 15

## 2021-04-11 MED ORDER — VITAMIN K1 10 MG/ML IJ SOLN
5.0000 mg | INTRAMUSCULAR | Status: DC
  Filled 2021-04-11: qty 0.5

## 2021-04-11 MED ORDER — VITAMIN K1 10 MG/ML IJ SOLN
10.0000 mg | INTRAVENOUS | Status: DC
  Filled 2021-04-11: qty 1

## 2021-04-11 MED ORDER — IOHEXOL 350 MG/ML SOLN
75.0000 mL | Freq: Once | INTRAVENOUS | Status: AC | PRN
  Administered 2021-04-11: 75 mL via INTRAVENOUS

## 2021-04-11 NOTE — Progress Notes (Addendum)
STROKE TEAM PROGRESS NOTE   INTERVAL HISTORY Her husband is at the bedside.  She remains intubated, and off sedation she remain minimally responsive.  She remains on vasopressors for hemodynamic support.  EEG today shows moderate diffuse encephalopathy secondary to toxic metabolic causes but she has no epileptiform activity seen. She is currently getting hemodialysis.  MR angiogram of the brain is pending Vitals:   04/11/21 0945 04/11/21 1000 04/11/21 1030 04/11/21 1045  BP: (!) 89/65 (!) 73/54 (!) 87/33 99/61  Pulse: (!) 55 (!) 54 (!) 48 (!) 51  Resp: (!) 22 (!) 21 20 (!) 21  Temp:      TempSrc:      SpO2: 100% 100% 100% 100%  Weight:      Height:       CBC:  Recent Labs  Lab 03/15/2021 0454 04/09/21 0248 04/10/21 0500 04/11/21 0430  WBC 8.5   < > 18.3* 20.0*  NEUTROABS 6.3  --  16.5*  --   HGB 9.6*   < > 10.0* 10.5*  HCT 30.0*   < > 29.9* 32.1*  MCV 84.7   < > 82.4 84.9  PLT 256   < > 246 250   < > = values in this interval not displayed.    Basic Metabolic Panel:  Recent Labs  Lab 04/10/21 0500 04/10/21 1830 04/11/21 0430  NA 137 138 133*  K 4.0 3.3* 3.8  CL 100 100 100  CO2 21* 22 21*  GLUCOSE 99 228* 223*  BUN 39* 21 17  CREATININE 4.72* 2.24* 1.68*  CALCIUM 8.8* 8.4* 8.7*  MG 2.6*  --  2.6*  PHOS 5.2*  5.2* 3.1  --      Lipid Panel:  Recent Labs  Lab 03/26/2021 0454  CHOL 165  TRIG 62  HDL 63  CHOLHDL 2.6  VLDL 12  LDLCALC 90     HgbA1c:  Recent Labs  Lab 04/03/2021 0454  HGBA1C 7.0*    Urine Drug Screen: No results for input(s): LABOPIA, COCAINSCRNUR, LABBENZ, AMPHETMU, THCU, LABBARB in the last 168 hours.  Alcohol Level No results for input(s): ETH in the last 168 hours.  IMAGING past 24 hours DG CHEST PORT 1 VIEW  Result Date: 04/10/2021 CLINICAL DATA:  65 year old female status post central line placement. EXAM: PORTABLE CHEST - 1 VIEW COMPARISON:  Earlier the same day FINDINGS: The mediastinal contours are within normal limits. No  cardiomegaly. Interval insertion of right internal jugular central venous catheter with the catheter tip in the superior vena cava. Unchanged position of the indwelling left internal jugular tunneled hemodialysis catheter with the tip in the right atrium, endotracheal tube with the tip in the midthoracic trachea, and enteric feeding tube courses off the inferior aspect of this image. The lungs are clear bilaterally without evidence of focal consolidation, pleural effusion, or pneumothorax. No acute osseous abnormality. IMPRESSION: No pneumothorax after placement of right internal jugular central line which appears appropriately positioned. Ruthann Cancer, MD [facility] Electronically Signed   By: Ruthann Cancer M.D.   On: 04/10/2021 14:59    PHYSICAL EXAM  Mental Status:  Intubated and unresponsive, middle-aged African-American lady opens eyes spontaneously but does not fixate or track examiner.  She does not follow commands.  She opens eyes partially to sternal rub but will not follow gaze.  Moves left upper and lower extremity semipurposeful due to pain but no movement on the right side. No neglect is noted.   Cranial Nerves:  II: PERRL 3 mm/brisk.  III, IV, VI: Oculocephalic reflex is intact V: Corneal reflexes are intact. no blinks to threat Bilaterally VII: Unable to assess facial asymmetry 2/2 ETT VIII: Unable to assess due to patient condition IX, X: Cough and gag reflexes are intact XI: Head is grossly midline XII: Unable to assess due to patient condition  Motor: Right upper and lower extremities are without spontaneous movement or withdraw to noxious stimuli Left upper extremity weakly withdraws to noxious stimuli, but does not move spontaneously as on last evaluation. left lower extremity with extension movement with application of noxious stimuli and does not withdraw right side to pain.    Tone is normal. Bulk is normal.    Sensation: Patient withdraws left upper and lower extremities with application of noxious stimuli. Right hemibody without response to noxious stimuli but with application of noxious stimuli on the right, the corresponding left extremity reacts.  Coordination: Unable to assess due to patient's condition Gait: Deferred  ASSESSMENT/PLAN Ms. Celese Underkoffler is a 65 y.o. female with history of  atrial fibrillation on coumadin, chronic combined systolic and diastolic CHF with EF of [id number], ESRD on HD M,W,F, CAD s/p PCI to LAD in 2017, RCA 2019, hyperlipidemia, essential hypertension, stroke without residual deficits, type 2 diabetes mellitus, and a recent AV graft placement of the left arm on 8/25, who presented to the ED 03/14/2021 for evaluation of 2 days of malaise, weakness, fatigue, and decreased appetite and was found to have an NSTEMI.   She was to have a cardiac cath and anticoagulation was discontinued and she was placed on heparin drip. The cath did not happen because developed significantly altered mentation. She was seen staring into space and had left arm twitching concerning for seizure activity. CTH showed age indeterminate small infarcts at bilateral cerebellar hemispheres, as well as chronic infarcts at bilateral basal ganglia, right thalamus, and small vessel changes. MRI brain done shows an acute left cerebellar stroke, as well as remote strokes at bilateral cerebellar hemispheres and right thalamus, suggestive of embolic eents affecting the left ACA and left PICA territories.   EEG today shows periodic discharges on the ictal -interictal continuum, as well as moderate diffuse encephalopathy, but no seizures seen.   Given eeg findings patient was loaded with keppra 1.5gm and started on keppra '750mg'$  BID on 9/1. She is on a heparin drip, and given her NSTEMI, we agree to continued use but are aware of risk for bleeding.   PLAN: repeat brain CT today along with CTA head and neck to assess  for new strokes and lesions. Would not recommend continued use of vitamin K at this time, as patient had infarcts when off anticoagulation; INR is most likely falsely elevated d/t liver disease. Will follow.    Stroke :  bilateral   infarct embolic secondary to atrial fibrillation, MI, and off anticoagulation for recent surgery  CT head: Small infarcts within the bilateral cerebellar hemispheres are new as compared to the brain MRI of 06/17/2019, but otherwise age-indeterminate. Consider a brain MRI for further evaluation. Redemonstrated chronic lacunar infarcts versus prominent perivascular spaces within bilateral basal ganglia. Redemonstrated chronic lacunar infarct within the right thalamus. Moderate chronic small vessel ischemic changes within cerebral white matter. Mild generalized parenchymal atrophy.  MRI :  1. Multiple small acute/subacute infarcts in the left ACA and left PICA territories, suggesting embolic events. 2. Moderate chronic microvascular ischemic changes of the white matter and parenchymal. MRA HEAD AND NECK  pending 2D Echo  LVEF 25-30%, LA dilated, lipotamous interatrial septum.    LDL 90 HgbA1c 7.0 VTE prophylaxis - SCD    There are no active orders of the following types: Diet, Nourishments.   warfarin daily prior to admission, now on heparin IV.   Therapy recommendations:    Disposition:  unclear  Hypertension Home meds:  amlodipine, coreg, hydralazine, Imdur,  Unstable Permissive hypertension (OK if < 220/120) but gradually normalize in 5-7 days Long-term BP goal normotensive  Hyperlipidemia Home meds:  lipitor, not resumed in hospital d/t liver failure LDL 90, goal < 70   Diabetes type II Controlled Home meds:  glucotrol, insulin HgbA1c 7.0, goal < 7.0 CBGs Recent Labs    04/10/21 2335 04/11/21 0317 04/11/21 0744  GLUCAP 276* 175* 226*     SSI  Other Stroke Risk Factors  Advanced Age >/= 72   Hx stroke/TIA  Family hx stroke     Coronary artery disease  Congestive heart failure    Hospital day # 4   Patient remains critically ill with acute myocardial infarction, renal failure, hepatic encephalopathy and hypotension.   Patient's liver function and coagulation profile appear to be worsening.  Discontinue MR angiogram and instead do CT angiogram of the brain and neck as it will save time and be quicker.  Continue Keppra for seizures and discontinue long-term EEG monitoring.  Recommend low-dose IV heparin drip stroke protocol despite INR being elevated which is likely falsely elevated due to hepatic dysfunction.  Long discussion with patient's husband and   answered questions.  Long discussion with Dr. Ander Slade critical care medicine and answered questions This patient is critically ill and at significant risk of neurological worsening, death and care requires constant monitoring of vital signs, hemodynamics,respiratory and cardiac monitoring, extensive review of multiple databases, frequent neurological assessment, discussion with family, other specialists and medical decision making of high complexity.I have made any additions or clarifications directly to the above note.This critical care time does not reflect procedure time, or teaching time or supervisory time of PA/NP/Med Resident etc but could involve care discussion time.  I spent 30 minutes of neurocritical care time  in the care of  this patient.     Antony Contras, MD Medical Director [place] Pager: [phone] 04/11/2021 11:04 AM     To contact Stroke Continuity provider, please refer to [email or web address] After hours, contact General Neurology

## 2021-04-11 NOTE — Progress Notes (Addendum)
ANTICOAGULATION CONSULT NOTE  Pharmacy Consult for heparin Indication: chest pain/ACS/stroke   Allergies  Allergen Reactions   Ace Inhibitors Cough    Patient Measurements: Height: '5\' 7"'$  (170.2 cm) Weight: 68.3 kg (150 lb 9.2 oz) IBW/kg (Calculated) : 61.6 Heparin Dosing Weight: 79 kg   Vital Signs: Temp: 97.2 F (36.2 C) (09/02 1945) Temp Source: Axillary (09/02 1945) BP: 128/58 (09/02 1915) Pulse Rate: 61 (09/02 1915)  Labs: Recent Labs    04/09/21 0248 04/09/21 0930 04/09/21 1450 04/10/21 0140 04/10/21 0500 04/10/21 0536 04/10/21 1830 04/10/21 2240 04/11/21 0430 04/11/21 1632  HGB 11.2* 10.5*  --   --  10.0*  --   --   --  10.5*  --   HCT 34.5* 31.0*  --   --  29.9*  --   --   --  32.1*  --   PLT 297  --   --   --  246  --   --   --  250  --   LABPROT  --   --    < >  --   --  31.5*  --  32.8* 32.0*  --   INR  --   --    < >  --   --  3.1*  --  3.2* 3.1*  --   HEPARINUNFRC 0.21*  --   --  <0.10* 0.11* 0.11*  --   --   --   --   CREATININE 5.43*  --    < >  --  4.72*  --  2.24*  --  1.68* 1.49*   < > = values in this interval not displayed.     Estimated Creatinine Clearance: 36.6 mL/min (A) (by C-G formula based on SCr of 1.49 mg/dL (H)).   Assessment: 32 YOF who presents with weakness and decreased appetite. Troponin elevated (peaked at 14,294) concerning for NSTEMI. Of note, patient is on warfarin at home for h/o Afib. 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 INR elevated at 3.1 but thought secondary to elevated liver function (s/p vitamin K on 9/1). Hgb 10.5, plt 250. TEG normal x2 yesterday.  Discussed with team - plan for fixed rate heparin infusion overnight.   Goal of Therapy:  Heparin level 0.3-0.7 units/ml Monitor platelets by anticoagulation protocol: Yes    Plan:  -Start heparin infusion at 500 units/hr -Will order heparin level in 8 hours  -No plans for titration unless elevated until AM rounds -Monitor daily HL, CBC, and for s/sx of bleeding   Thank you for involving pharmacy in this patient's care.  Antonietta Jewel, PharmD, [facility] Clinical Pharmacist  Phone: [phone] 04/11/2021 8:13 PM  Please check AMION for all [facility] phone numbers After 10:00 PM, call [place] [phone]

## 2021-04-11 NOTE — Progress Notes (Signed)
Pt transported to CT and back to 86M 13 on full vent support. No complications noted.

## 2021-04-11 NOTE — Progress Notes (Signed)
LTM discontinued for MRA; no skin breakdown was seen. [facility] notified.

## 2021-04-11 NOTE — Progress Notes (Signed)
Progress Note  Patient Name: Amber Young Date of Encounter: 04/11/2021  Primary Cardiologist: Skeet Latch, MD  Subjective   Remains intubated, sedated.  Inpatient Medications    Scheduled Meds:  B-complex with vitamin C  1 tablet Per Tube Daily   chlorhexidine gluconate (MEDLINE KIT)  15 mL Mouth Rinse BID   Chlorhexidine Gluconate Cloth  6 each Topical Daily   docusate  100 mg Per Tube BID   famotidine  10 mg Per Tube Daily   feeding supplement (PROSource TF)  45 mL Per Tube TID   heparin injection (subcutaneous)  5,000 Units Subcutaneous Q8H   insulin aspart  0-6 Units Subcutaneous Q4H   mouth rinse  15 mL Mouth Rinse 10 times per day   polyethylene glycol  17 g Per Tube Daily   sodium chloride flush  10-40 mL Intracatheter Q12H   sodium chloride flush  3 mL Intravenous Q12H   Continuous Infusions:   prismasol BGK 4/2.5 500 mL/hr at 04/11/21 0747    prismasol BGK 4/2.5 500 mL/hr at 04/11/21 0746   acetylcysteine 6.25 mg/kg/hr (04/11/21 0700)   feeding supplement (VITAL 1.5 CAL) 50 mL/hr at 04/10/21 1700   levETIRAcetam Stopped (04/11/21 0035)   norepinephrine (LEVOPHED) Adult infusion 12 mcg/min (04/11/21 0700)   piperacillin-tazobactam Stopped (04/11/21 0653)   prismasol BGK 4/2.5 2,000 mL/hr at 04/11/21 0747   vancomycin     PRN Meds: alteplase, fentaNYL (SUBLIMAZE) injection, fentaNYL (SUBLIMAZE) injection, heparin, midazolam, midazolam, sodium chloride, sodium chloride flush   Vital Signs    Vitals:   04/11/21 0630 04/11/21 0645 04/11/21 0700 04/11/21 0746  BP: (!) 85/52 (!) 82/53 (!) 99/40   Pulse: (!) 52 (!) 52 (!) 53   Resp: 20 19 (!) 21   Temp:    97.6 F (36.4 C)  TempSrc:    Axillary  SpO2: 100% 100% 100%   Weight:      Height:        Intake/Output Summary (Last 24 hours) at 04/11/2021 0825 Last data filed at 04/11/2021 0700 Gross per 24 hour  Intake 2506.59 ml  Output 1980 ml  Net 526.59 ml   Last 3 Weights 04/11/2021 04/10/2021  04/09/2021  Weight (lbs) 150 lb 9.2 oz 151 lb 7.3 oz 155 lb 13.8 oz  Weight (kg) 68.3 kg 68.7 kg 70.7 kg     Telemetry    SB earlier overnight, now NSR, QTc 476m - Personally Reviewed  Physical Exam   GEN: No acute distress. On vent HEENT: Normocephalic, atraumatic, sclera non-icteric. Neck: No JVD  Cardiac: RRR no murmurs, rubs, or gallops.  Respiratory: Clear to auscultation bilaterally. Breathing is unlabored. GI: Soft, nontender, non-distended, BS +x 4. MS: no deformity. Extremities: No clubbing or cyanosis. No edema. Distal pedal pulses are 2+ and equal bilaterally. Neuro:  Sedated, occaionally makes spontaneous movements turning head Psych:  Unable to assess, not agitated  Labs    High Sensitivity Troponin:   Recent Labs  Lab 03/16/2021 0806 03/20/2021 0930 03/11/2021 1442 03/19/2021 1830 04/03/2021 0454  TROPONINIHS 1,320* 1,605* 2,745* 5,564* 14,294*      Cardiac EnzymesNo results for input(s): TROPONINI in the last 168 hours. No results for input(s): TROPIPOC in the last 168 hours.   Chemistry Recent Labs  Lab 04/09/21 0248 04/09/21 0930 04/10/21 0500 04/10/21 1830 04/11/21 0430  NA 137   < > 137 138 133*  K 5.5*   < > 4.0 3.3* 3.8  CL 98   < > 100 100 100  CO2 17*   < > 21* 22 21*  GLUCOSE 105*   < > 99 228* 223*  BUN 34*   < > 39* 21 17  CREATININE 5.43*   < > 4.72* 2.24* 1.68*  CALCIUM 9.0   < > 8.8* 8.4* 8.7*  PROT 6.7  --  6.2*  --  6.1*  ALBUMIN 3.2*   < > 3.0*  2.9* 2.4* 2.5*  AST 1,363*  --  9,649*  --  4,440*  ALT 372*  --  2,575*  --  1,923*  ALKPHOS 115  --  119  --  151*  BILITOT 1.5*  --  2.2*  --  2.3*  GFRNONAA 8*   < > 10* 24* 34*  ANIONGAP 22*   < > 16* 16* 12   < > = values in this interval not displayed.     Hematology Recent Labs  Lab 04/09/21 0248 04/09/21 0930 04/10/21 0500 04/11/21 0430  WBC 12.2*  --  18.3* 20.0*  RBC 4.13  --  3.63* 3.78*  HGB 11.2* 10.5* 10.0* 10.5*  HCT 34.5* 31.0* 29.9* 32.1*  MCV 83.5  --  82.4  84.9  MCH 27.1  --  27.5 27.8  MCHC 32.5  --  33.4 32.7  RDW 16.9*  --  16.7* 17.4*  PLT 297  --  246 250    BNPNo results for input(s): BNP, PROBNP in the last 168 hours.   DDimer No results for input(s): DDIMER in the last 168 hours.   Radiology    CT HEAD WO CONTRAST (5MM)  Result Date: 04/09/2021 CLINICAL DATA:  Mental status change, unknown cause. Intubated and unresponsive. EXAM: CT HEAD WITHOUT CONTRAST TECHNIQUE: Contiguous axial images were obtained from the base of the skull through the vertex without intravenous contrast. COMPARISON:  Prior head CT examinations 03/16/2021 and earlier. Brain MRI 06/17/2019. FINDINGS: Brain: Mild generalized cerebral and cerebellar atrophy. There are small infarcts within the bilateral cerebellar hemispheres which are new as compared to the brain MRI of 06/17/2019, but otherwise age-indeterminate. Redemonstrated chronic lacunar infarcts versus prominent perivascular spaces within the bilateral basal ganglia (series 2, image 16). In retrospect, these infarcts were present on the head CT head 03/14/2021. Redemonstrated chronic lacunar infarct within the right thalamus. Moderate patchy and ill-defined hypoattenuation within the cerebral white matter, nonspecific but compatible with chronic small vessel ischemic disease. There is no acute intracranial hemorrhage. No demarcated cortical infarct. No extra-axial fluid collection. No evidence of an intracranial mass. No midline shift. Vascular: No hyperdense vessel.  Atherosclerotic calcifications. Skull: Normal. Negative for fracture or focal lesion. Sinuses/Orbits: Visualized orbits show no acute finding. No significant paranasal sinus disease at the imaged levels. IMPRESSION: Small infarcts within the bilateral cerebellar hemispheres are new as compared to the brain MRI of 06/17/2019, but otherwise age-indeterminate. Consider a brain MRI for further evaluation. Redemonstrated chronic lacunar infarcts versus  prominent perivascular spaces within bilateral basal ganglia. Redemonstrated chronic lacunar infarct within the right thalamus. Moderate chronic small vessel ischemic changes within cerebral white matter. Mild generalized parenchymal atrophy. Electronically Signed   By: Kellie Simmering D.O.   On: 04/09/2021 08:50   MR BRAIN WO CONTRAST  Result Date: 04/09/2021 CLINICAL DATA:  Mental status change, unknown cause. EXAM: MRI HEAD WITHOUT CONTRAST TECHNIQUE: Multiplanar, multiecho pulse sequences of the brain and surrounding structures were obtained without intravenous contrast. COMPARISON:  Head CT April 09, 2021 FINDINGS: The study is partially degraded by motion. Brain: A 9 mm focus of restricted  diffusion is seen in the left cerebellar hemisphere. Scattered foci of restricted diffusion are also seen within the deep white matter of the left frontal lobe, as well as within the medial left frontal parietal cortex. No hemorrhage, hydrocephalus, extra-axial collection or mass lesion. Remote lacunar infarcts in the bilateral cerebellar hemispheres and right thalamus. Scattered and confluent foci of T2 hyperintensity are seen within the white matter of the cerebral hemispheres, nonspecific, most likely related to chronic small vessel ischemia. Moderate parenchymal volume loss. Focus of susceptibility artifact in the left amygdala suggesting hemosiderin deposit. Vascular: Normal flow voids. Skull and upper cervical spine: Normal marrow signal. Sinuses/Orbits: Negative. Other: None. IMPRESSION: 1. Multiple small acute/subacute infarcts in the left ACA and left PICA territories, suggesting embolic events. 2. Moderate chronic microvascular ischemic changes of the white matter and parenchymal. Electronically Signed   By: Pedro Earls M.D.   On: 04/09/2021 17:04   DG CHEST PORT 1 VIEW  Result Date: 04/10/2021 CLINICAL DATA:  65 year old female status post central line placement. EXAM: PORTABLE CHEST - 1  VIEW COMPARISON:  Earlier the same day FINDINGS: The mediastinal contours are within normal limits. No cardiomegaly. Interval insertion of right internal jugular central venous catheter with the catheter tip in the superior vena cava. Unchanged position of the indwelling left internal jugular tunneled hemodialysis catheter with the tip in the right atrium, endotracheal tube with the tip in the midthoracic trachea, and enteric feeding tube courses off the inferior aspect of this image. The lungs are clear bilaterally without evidence of focal consolidation, pleural effusion, or pneumothorax. No acute osseous abnormality. IMPRESSION: No pneumothorax after placement of right internal jugular central line which appears appropriately positioned. Ruthann Cancer, MD [facility] Radiology Electronically Signed   By: Ruthann Cancer M.D.   On: 04/10/2021 14:59   DG Chest Port 1 View  Result Date: 04/10/2021 CLINICAL DATA:  Abnormal respiration EXAM: PORTABLE CHEST 1 VIEW COMPARISON:  Chest x-ray dated April 09, 2021 FINDINGS: Slight interval retraction of ET tube with tip positioned approximately 3.5 cm from the carina. Unchanged position of left central venous line. Enteric feeding tube partially seen coursing below the diaphragm. Cardiac and mediastinal contours are unchanged. Lungs are clear. No large pleural effusion or evidence of pneumothorax. IMPRESSION: Lungs are clear. Electronically Signed   By: Yetta Glassman M.D.   On: 04/10/2021 10:46   DG CHEST PORT 1 VIEW  Result Date: 04/09/2021 CLINICAL DATA:  History of heart failure, intubated EXAM: PORTABLE CHEST 1 VIEW COMPARISON:  Chest radiograph 03/17/2021 FINDINGS: The endotracheal tube terminates approximately 1.7 cm from the carina. The left dialysis catheter terminates in the right atrium. Defibrillator pads and additional leads overlie the patient. The cardiomediastinal silhouette is stable, with unchanged  cardiomegaly. There is no focal consolidation or pulmonary edema. There is no pleural effusion or pneumothorax. The bones are stable. IMPRESSION: 1. Endotracheal tube terminates approximately 1.7 cm from the carina. 2. Unchanged cardiomegaly. 3. No radiographic evidence of acute cardiopulmonary process. Electronically Signed   By: Valetta Mole M.D.   On: 04/09/2021 10:31   DG Abd Portable 1V  Result Date: 04/09/2021 CLINICAL DATA:  Feeding tube placement EXAM: PORTABLE ABDOMEN - 1 VIEW COMPARISON:  CT scan 03/15/2021 FINDINGS: Feeding tube extends into the stomach and terminates in the gastric body. There is some residual contrast medium in the transverse colon. Extensive atherosclerotic vascular calcifications noted. IMPRESSION: 1. The feeding tube tip is in the stomach body. 2. Atherosclerosis.  Electronically Signed   By: Van Clines M.D.   On: 04/09/2021 18:23   EEG adult  Result Date: 04/09/2021 Lora Havens, MD     04/09/2021 11:32 AM Patient Name: Amber Young MRN: [id number] Epilepsy Attending: Lora Havens Referring Physician/Provider: Dr Su Monks Date: 04/09/2021 Duration: 23.17 mins Patient history: 65yo F with altered mental status with questionable left arm seizure activity. EEG to evaluate for seizure Level of alertness: Awake AEDs during EEG study: None Technical aspects: This EEG study was done with scalp electrodes positioned according to the 10-20 International system of electrode placement. Electrical activity was acquired at a sampling rate of _0  and reviewed with a high frequency filter of _1  and a low frequency filter of _2 . EEG data were recorded continuously and digitally stored. Description: EEG showed continuous generalized 3 to 6 Hz theta-delta slowing, at times with triphasic morphology.  Hyperventilation and photic stimulation were not performed.   ABNORMALITY - Continuous slow, generalized IMPRESSION: This study is suggestive of moderate diffuse  encephalopathy, nonspecific etiology. No seizures or epileptiform discharges were seen throughout the recording. Priyanka [name]   Overnight EEG with video  Result Date: 04/10/2021 Lora Havens, MD     04/10/2021  9:40 AM Patient Name: Amber Young MRN: [id number] Epilepsy Attending: Lora Havens Referring Physician/Provider: Payton Doughty, NP Duration: 04/09/2021 1925 to 04/10/2021 0930  Patient history: 65yo F with altered mental status with questionable left arm seizure activity. EEG to evaluate for seizure  Level of alertness: Awake, asleep  AEDs during EEG study: None  Technical aspects: This EEG study was done with scalp electrodes positioned according to the 10-20 International system of electrode placement. Electrical activity was acquired at a sampling rate of _3  and reviewed with a high frequency filter of _4  and a low frequency filter of _5 . EEG data were recorded continuously and digitally stored.  Description: EEG showed continuous generalized 3 to 6 Hz theta-delta slowing.  Generalized periodic discharges with triphasic morphology at 1 to 1.5 Hz were also noted, predominantly when patient was awake/stimulated.  Hyperventilation and photic stimulation were not performed.    ABNORMALITY - Continuous slow, generalized - Periodic discharges with triphasic morphology, generalized  IMPRESSION: This study showed generalized periodic discharges with triphasic morphology which is on the ictal-interictal continuum.  However, the morphology, frequency and reactivity to stimulation is more commonly seen due to toxic-metabolic causes.  There is also moderate diffuse encephalopathy, nonspecific etiology. No seizures were seen throughout the recording.  Lora Havens   ECHOCARDIOGRAM COMPLETE  Result Date: 04/09/2021    ECHOCARDIOGRAM REPORT   Patient Name:   Amber Young Date of Exam: 04/09/2021 Medical Rec #:  [id number]        Height:       67.0 in Accession #:    [id number]       Weight:        155.9 lb Date of Birth:  [birth year]        BSA:          1.819 m Patient Age:    65 years         BP:           103/67 mmHg Patient Gender: F                HR:           49 bpm. Exam Location:  Inpatient Procedure: 2D Echo, Cardiac Doppler, Color Doppler and Intracardiac  Opacification Agent Indications:    122-I22.9 Subsequent ST elevation (STEM) and non-ST elevation                 (NSTEMI) myocardial infarction  History:        Patient has prior history of Echocardiogram examinations, most                 recent 07/01/2019. Cardiomyopathy, Previous Myocardial                 Infarction, Acute MI and CAD, Abnormal ECG, Stroke,                 Arrythmias:Atrial Fibrillation,                 Signs/Symptoms:Dizziness/Lightheadedness and Dyspnea; Risk                 Factors:Hypertension. ESRD.  Sonographer:    Roseanna Rainbow RDCS Referring Phys: [id number] [place]  1. Left ventricular ejection fraction, by estimation, is 25 to 30%. The left ventricle has severely decreased function. The left ventricle demonstrates global hypokinesis. There is moderate left ventricular hypertrophy. Left ventricular diastolic function could not be evaluated. Elevated left ventricular end-diastolic pressure. There is akinesis of the left ventricular, mid-apical septal wall.  2. Right ventricular systolic function is mildly reduced. The right ventricular size is normal.  3. Left atrial size was moderately dilated.  4. Right atrial size was mildly dilated.  5. The mitral valve is abnormal. Mild mitral valve regurgitation. No evidence of mitral stenosis. Moderate to severe mitral annular calcification.  6. The aortic valve is grossly normal. There is mild calcification of the aortic valve. There is mild thickening of the aortic valve. Aortic valve regurgitation is not visualized. Mild to moderate aortic valve sclerosis/calcification is present, without  any evidence of aortic stenosis. Comparison(s): Prior  images reviewed side by side. Changes from prior study are noted. Conclusion(s)/Recommendation(s): Rhythm changed between sinus bradycardia and rate controlled atrial fibrillation during study. Overall, compared to prior the pattern of wall motion abnormalities is similar, but they are more pronounced on current study,  with slight decrease in EF from prior. Findings communicated with NP Reino Bellis. FINDINGS  Left Ventricle: Global hypokinesis with focal akinesis of the mid to distal anterior and inferior septum. Left ventricular ejection fraction, by estimation, is 25 to 30%. The left ventricle has severely decreased function. The left ventricle demonstrates global hypokinesis. The left ventricular internal cavity size was normal in size. There is moderate left ventricular hypertrophy. Left ventricular diastolic function could not be evaluated due to atrial fibrillation. Left ventricular diastolic function could not be evaluated. Elevated left ventricular end-diastolic pressure. Right Ventricle: The right ventricular size is normal. Right vetricular wall thickness was not well visualized. Right ventricular systolic function is mildly reduced. Left Atrium: Left atrial size was moderately dilated. Right Atrium: Right atrial size was mildly dilated. Pericardium: There is no evidence of pericardial effusion. Mitral Valve: The mitral valve is abnormal. There is mild thickening of the mitral valve leaflet(s). There is mild calcification of the mitral valve leaflet(s). Moderate to severe mitral annular calcification. Mild mitral valve regurgitation. No evidence  of mitral valve stenosis. Tricuspid Valve: The tricuspid valve is normal in structure. Tricuspid valve regurgitation is trivial. No evidence of tricuspid stenosis. Aortic Valve: The aortic valve is grossly normal. There is mild calcification of the aortic valve. There is mild thickening of the aortic valve. There is moderate aortic valve annular  calcification. Aortic valve regurgitation is not visualized. Mild to moderate aortic valve sclerosis/calcification is present, without any evidence of aortic stenosis. Pulmonic Valve: The pulmonic valve was not well visualized. Pulmonic valve regurgitation is not visualized. No evidence of pulmonic stenosis. Aorta: The aortic root, ascending aorta and aortic arch are all structurally normal, with no evidence of dilitation or obstruction. Venous: IVC assessment for right atrial pressure unable to be performed due to mechanical ventilation. IAS/Shunts: The interatrial septum appears to be lipomatous. The atrial septum is grossly normal.  LEFT VENTRICLE PLAX 2D LVIDd:         4.80 cm      Diastology LVIDs:         4.57 cm      LV e' medial:    2.13 cm/s LV PW:         1.80 cm      LV E/e' medial:  46.1 LV IVS:        1.20 cm      LV e' lateral:   2.13 cm/s LVOT diam:     1.90 cm      LV E/e' lateral: 46.1 LV SV:         40 LV SV Index:   22 LVOT Area:     2.84 cm  LV Volumes (MOD) LV vol d, MOD A2C: 97.9 ml LV vol d, MOD A4C: 115.3 ml LV vol s, MOD A2C: 65.4 ml LV vol s, MOD A4C: 69.9 ml LV SV MOD A2C:     32.5 ml LV SV MOD A4C:     115.3 ml LV SV MOD BP:      39.0 ml RIGHT VENTRICLE            IVC RV S prime:     5.70 cm/s  IVC diam: 1.90 cm TAPSE (M-mode): 1.5 cm LEFT ATRIUM             Index       RIGHT ATRIUM           Index LA diam:        3.40 cm 1.87 cm/m  RA Area:     15.70 cm LA Vol (A2C):   69.9 ml 38.43 ml/m RA Volume:   44.20 ml  24.30 ml/m LA Vol (A4C):   44.6 ml 24.52 ml/m LA Biplane Vol: 56.4 ml 31.01 ml/m  AORTIC VALVE LVOT Vmax:   70.70 cm/s LVOT Vmean:  39.200 cm/s LVOT VTI:    0.140 m  AORTA Ao Root diam: 3.10 cm Ao Asc diam:  3.00 cm MITRAL VALVE MV Area (PHT): 3.31 cm    SHUNTS MV Decel Time: 229 msec    Systemic VTI:  0.14 m MV E velocity: 98.10 cm/s  Systemic Diam: 1.90 cm Buford Dresser MD Electronically signed by Buford Dresser MD Signature Date/Time: 04/09/2021/6:24:41 PM     Final    US LIVER DOPPLER  Result Date: 04/10/2021 CLINICAL DATA:  Transaminitis EXAM: DUPLEX ULTRASOUND OF LIVER TECHNIQUE: Color and duplex Doppler ultrasound was performed to evaluate the hepatic in-flow and out-flow vessels. COMPARISON:  CT 03/25/2021 FINDINGS: Liver: Normal parenchymal echogenicity. Normal hepatic contour without nodularity. No focal lesion, mass or intrahepatic biliary ductal dilatation. Main Portal Vein size: 0.9 cm Portal Vein Velocities (all hepatopetal): Main Prox:  20 cm/sec Main Mid: 14 cm/sec Main Dist:  11 cm/sec Right: 9 cm/sec Left: 9 cm/sec Hepatic Vein Velocities (all hepatofugal): Right:  9 cm/sec Middle:  10 cm/sec Left:  15 cm/sec  IVC: Present and patent with normal respiratory phasicity. Hepatic Artery Velocity:  106 cm/sec Splenic Vein Velocity:  12 cm/sec Spleen: 8.8 cm x 3.9 cm x 3.5 cm with a total volume of 62 cm^3 (411 cm^3 is upper limit normal) Portal Vein Occlusion/Thrombus: No Splenic Vein Occlusion/Thrombus: No Ascites: None Varices: None IMPRESSION: Unremarkable hepatic vascular Doppler evaluation. Electronically Signed   By: Lucrezia Europe M.D.   On: 04/10/2021 08:12    Cardiac Studies  2D echo 04/09/21  1. Left ventricular ejection fraction, by estimation, is 25 to 30%. The  left ventricle has severely decreased function. The left ventricle  demonstrates global hypokinesis. There is moderate left ventricular  hypertrophy. Left ventricular diastolic  function could not be evaluated. Elevated left ventricular end-diastolic  pressure. There is akinesis of the left ventricular, mid-apical septal  wall.   2. Right ventricular systolic function is mildly reduced. The right  ventricular size is normal.   3. Left atrial size was moderately dilated.   4. Right atrial size was mildly dilated.   5. The mitral valve is abnormal. Mild mitral valve regurgitation. No  evidence of mitral stenosis. Moderate to severe mitral annular  calcification.   6. The aortic  valve is grossly normal. There is mild calcification of the  aortic valve. There is mild thickening of the aortic valve. Aortic valve  regurgitation is not visualized. Mild to moderate aortic valve  sclerosis/calcification is present, without   any evidence of aortic stenosis.   Comparison(s): Prior images reviewed side by side. Changes from prior  study are noted.   Conclusion(s)/Recommendation(s): Rhythm changed between sinus bradycardia  and rate controlled atrial fibrillation during study. Overall, compared to  prior the pattern of wall motion abnormalities is similar, but they are  more pronounced on current study,   with slight decrease in EF from prior. Findings communicated with NP  Reino Bellis.   Patient Profile     65 y.o. female with CAD S/p LAD PCI in 2017 and overlapping DES in 2019, PAF, chronic combined CHF, HTN, HLD, prior stroke, ESRD presented to the hospital 03/23/2021 with complaints of weakness, decreased appetite and feeling poorly. She had recently undergone AV Graft placement 04/03/21. She had missed HD the day of admission due to feeling bad. She was also noted to be sleepy with altered mental status. INR was subtherapeutic on admission. This has waxed and waned during admission. Other findings notable for significantly elevated procalcitonin, transaminitis (present on admission), hyperkalemia. UA attempted 8/30 with milky white pus in catheter. She was found to have troponin elevated to 14k, therefore cardiology following for NSTEMI. Cath attempted but aborted due to AMS and inability to comply with instructions. Also developed PAF RVR during admission.   Per husband patient has had 4 months of waxing/waning mental status but this has been acutely worse. On 8/31, she developed decreased responsiveness and seizurelike behavior in her left arm. She also had bradycardia in the upper 30s-low 40s requiring cessation of amiodarone and carvedilol. MRI brain showed Multiple  small acute/subacute infarcts in the left ACA and left PICA territories, suggesting embolic events. Liver function continued to worsen with hypercoagulopathy, shock requiring pressor support, lactic acidosis.    Assessment & Plan   1. Constellation of symptoms including diffuse myalgias, altered mental status/acute encephalopathy superimposed on months of waxing/waning mental status, acute liver failure, elevated procalcitonin, then worsening decompensation with seizure like activity on 8/31 / VDRF - some features concerning for infective process, no clear source identified yet -  f/u blood cx pending - started on empiric abx yesterday, also norepinephrine, acetylcysteine - GI now involved for liver disease, coagulopathy - further infection management per primary team  2. NSTEMI, prior hx of CAD - suspect this is a secondary process - NSTEMI would not specifically be the only factor driving the concern for infection or encephalopathy, but certainly confounding her admission and increasing prognostic concern - cath was deferred 83/0/22 due to AMS/inability to comply with directions - 2D echo this admission shows slight decline in LVEF from prior but per reader, not significantly changed in terms of WMA - ASA discontinued 9/1 in context of liver failure - completed 48hr of full dose heparin for NSTEMI, also now on hold from coagulopathy standpoint - hold BB, nitrate due to hypotension/episodic bradycardia - avoid statin due to transaminitis   3. Chronic combined CHF - volume managed by HD - 2D echo EF 25-30%, global HK with akinesis of the left ventricular, mid-apical septal wall, mildly reduced RVF, mild MR -> Overall, compared to prior the pattern of wall motion abnormalities is similar, but they are  more pronounced on current study with slight decrease in EF from prior - cannot pursue GDMT due to hypotension   4. Paroxysmal atrial fib with recurrence while admitted, along with sinus  bradycardia this admission - on warfarin prior to admission with subtherapeutic INR on admission (?contribution to strokes) - heparin on hold due to coagulopathy - has had episodes of SB and AF with mild RVR - avoid amiodarone/BB at this time due to bradycardia - maintaining NSR with normal rates this AM   5. Transaminitis - per medical team, may need GI/ID w/u given concern for infection issues - LFTs pending   6. New acute/subacute stroke - per neurology   7. ESRD on HD with persistent hyperkalemia - per nephrology   8. Prolonged QTc - QTc improved to 433m on telemetry - follow while inpatient, avoid QT prolonging agents - lyte mgmt per nephrology   For questions or updates, please contact [email or web address] consult [email or web address] for contact info under Cardiology/STEMI.  Signed, DCharlie Pitter PA-C 04/11/2021, 8:25 AM

## 2021-04-11 NOTE — Progress Notes (Signed)
[facility] ROUNDING NOTE   Subjective:   Interval History: This is a 65 year old lady end-stage renal disease secondary to diabetes.  She has a history of atrial fibrillation and congestive heart failure with systolic dysfunction ejection fraction 25 to 30%.  She has a history of coronary artery disease and status post PTCA.  She is now admitted with an NSTEMI.  Her usual dialysis days are Monday Wednesday Friday.     She has a postop hematoma from recent left AV graft placement.  04/03/2021.  Patient underwent dialysis 03/27/2021.  He did not receive a cardiac catheterization due to pain and discomfort refusing procedure.  She developed acute dyspnea 04/09/2021 and was transferred to the intensive care unit for mechanical ventilation.  Appreciate assistance from critical care.  She started CRRT 06/09/2021. 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  Blood pressure 111/85 pulse 50 temperature 97.6 O2 sats 100% FiO2 40%  Sodium 133 potassium 3.8 chloride 100 CO2 21 BUN 17 creatinine 1.68 glucose 223 calcium 8.7 phosphorus 3.1   IV norepinephrine      Objective:  Vital signs in last 24 hours:  Temp:  [91.5 F (33.1 C)-97.6 F (36.4 C)] 97.6 F (36.4 C) (09/02 0746) Pulse Rate:  [43-123] 53 (09/02 0700) Resp:  [16-27] 21 (09/02 0700) BP: (65-150)/(40-80) 99/40 (09/02 0700) SpO2:  [85 %-100 %] 100 % (09/02 0700) FiO2 (%):  [40 %] 40 % (09/02 0321) Weight:  [68.3 kg] 68.3 kg (09/02 0442)  Weight change: -2.4 kg Filed Weights   04/09/21 0903 04/10/21 0500 04/11/21 0442  Weight: 70.7 kg 68.7 kg 68.3 kg    Intake/Output: I/O last 3 completed shifts: In: 3030.6 [I.V.:885.1; NG/GT:1495; IV Piggyback:650.5] Out: 2987 [Other:2986; Stool:1]   Intake/Output this shift:  No intake/output data recorded.  Patient intubated CVS- RRR mechanically supported breath sounds RS- CTA ABD- BS present hypoactive bowel sounds EXT- no edema     Basic Metabolic  Panel: Recent Labs  Lab 04/09/21 0248 04/09/21 0930 04/09/21 1450 04/10/21 0500 04/10/21 1830 04/11/21 0430  NA 137 132* 136 137 138 133*  K 5.5* 6.0* 6.0* 4.0 3.3* 3.8  CL 98  --  95* 100 100 100  CO2 17*  --  17* 21* 22 21*  GLUCOSE 105*  --  158* 99 228* 223*  BUN 34*  --  47* 39* 21 17  CREATININE 5.43*  --  6.22* 4.72* 2.24* 1.68*  CALCIUM 9.0  --  9.2 8.8* 8.4* 8.7*  MG 2.3  --   --  2.6*  --  2.6*  PHOS  --   --  9.4* 5.2*  5.2* 3.1  --      Liver Function Tests: Recent Labs  Lab 03/14/2021 0454 04/09/21 0248 04/09/21 1450 04/10/21 0500 04/10/21 1830 04/11/21 0430  AST 1,077* 1,363*  --  9,649*  --  4,440*  ALT 212* 372*  --  2,575*  --  1,923*  ALKPHOS 73 115  --  119  --  151*  BILITOT 1.3* 1.5*  --  2.2*  --  2.3*  PROT 6.2* 6.7  --  6.2*  --  6.1*  ALBUMIN 3.0* 3.2* 3.2* 3.0*  2.9* 2.4* 2.5*    Recent Labs  Lab 04/05/2021 0806  LIPASE 26    Recent Labs  Lab 03/28/2021 1552 04/10/21 1442  AMMONIA 68* 47*     CBC: Recent Labs  Lab 04/09/2021 0806 03/16/2021 0454 04/09/21 0248 04/09/21 0930 04/10/21 0500 04/11/21 0430  WBC 8.3 8.5 12.2*  --  18.3* 20.0*  NEUTROABS  --  6.3  --   --  16.5*  --   HGB 11.2* 9.6* 11.2* 10.5* 10.0* 10.5*  HCT 36.9 30.0* 34.5* 31.0* 29.9* 32.1*  MCV 89.3 84.7 83.5  --  82.4 84.9  PLT 328 256 297  --  246 250     Cardiac Enzymes: No results for input(s): CKTOTAL, CKMB, CKMBINDEX, TROPONINI in the last 168 hours.  BNP: Invalid input(s): POCBNP  CBG: Recent Labs  Lab 04/10/21 1947 04/10/21 2049 04/10/21 2335 04/11/21 0317 04/11/21 0744  GLUCAP 211* 298* 276* 175* 62*     Microbiology: Results for orders placed or performed during the hospital encounter of 04/03/2021  Culture, blood (routine x 2)     Status: None (Preliminary result)   Collection Time: 03/31/2021  9:22 AM   Specimen: BLOOD RIGHT FOREARM  Result Value Ref Range Status   Specimen Description BLOOD RIGHT FOREARM  Final   Special Requests    Final    BOTTLES DRAWN AEROBIC AND ANAEROBIC Blood Culture results may not be optimal due to an inadequate volume of blood received in culture bottles   Culture   Final    NO GROWTH 3 DAYS Performed at [facility], [place] [address], [place], [place] [address]    Report Status PENDING  Incomplete  Culture, blood (routine x 2)     Status: None (Preliminary result)   Collection Time: 03/17/2021  9:30 AM   Specimen: BLOOD RIGHT ARM  Result Value Ref Range Status   Specimen Description BLOOD RIGHT ARM  Final   Special Requests   Final    BOTTLES DRAWN AEROBIC AND ANAEROBIC Blood Culture adequate volume   Culture   Final    NO GROWTH 3 DAYS Performed at [facility], [place] [address], [place], [place] [address]    Report Status PENDING  Incomplete  Resp Panel by RT-PCR (Flu A&B, Covid) Nasopharyngeal Swab     Status: None   Collection Time: 03/20/2021 10:04 AM   Specimen: Nasopharyngeal Swab; Nasopharyngeal(NP) swabs in vial transport medium  Result Value Ref Range Status   SARS Coronavirus 2 by RT PCR NEGATIVE NEGATIVE Final    Comment: (NOTE) SARS-CoV-2 target nucleic acids are NOT DETECTED.  The SARS-CoV-2 RNA is generally detectable in upper respiratory specimens during the acute phase of infection. The lowest concentration of SARS-CoV-2 viral copies this assay can detect is 138 copies/mL. A negative result does not preclude SARS-Cov-2 infection and should not be used as the sole basis for treatment or other patient management decisions. A negative result may occur with  improper specimen collection/handling, submission of specimen other than nasopharyngeal swab, presence of viral mutation(s) within the areas targeted by this assay, and inadequate number of viral copies(<138 copies/mL). A negative result must be combined with clinical observations, patient history, and epidemiological information. The expected result is Negative.  Fact Sheet for Patients:   EntrepreneurPulse.com.au  Fact Sheet for Healthcare Providers:  IncredibleEmployment.be  This test is no t yet approved or cleared by the [place] FDA and  has been authorized for detection and/or diagnosis of SARS-CoV-2 by FDA under an Emergency Use Authorization (EUA). This EUA will remain  in effect (meaning this test can be used) for the duration of the COVID-19 declaration under Section 564(b)(1) of the Act, 21 U.S.C.section 360bbb-3(b)(1), unless the authorization is terminated  or revoked sooner.       Influenza A  by PCR NEGATIVE NEGATIVE Final   Influenza B by PCR NEGATIVE NEGATIVE Final    Comment: (NOTE) The Xpert Xpress SARS-CoV-2/FLU/RSV plus assay is intended as an aid in the diagnosis of influenza from Nasopharyngeal swab specimens and should not be used as a sole basis for treatment. Nasal washings and aspirates are unacceptable for Xpert Xpress SARS-CoV-2/FLU/RSV testing.  Fact Sheet for Patients: EntrepreneurPulse.com.au  Fact Sheet for Healthcare Providers: IncredibleEmployment.be  This test is not yet approved or cleared by the [place] FDA and has been authorized for detection and/or diagnosis of SARS-CoV-2 by FDA under an Emergency Use Authorization (EUA). This EUA will remain in effect (meaning this test can be used) for the duration of the COVID-19 declaration under Section 564(b)(1) of the Act, 21 U.S.C. section 360bbb-3(b)(1), unless the authorization is terminated or revoked.  Performed at [facility], [place] [address], [place], [place] [address]   MRSA Next Gen by PCR, Nasal     Status: None   Collection Time: 04/09/21  3:25 PM   Specimen: Nasal Mucosa; Nasal Swab  Result Value Ref Range Status   MRSA by PCR Next Gen NOT DETECTED NOT DETECTED Final    Comment: (NOTE) The GeneXpert MRSA Assay (FDA approved for NASAL specimens only), is one component of a  comprehensive MRSA colonization surveillance program. It is not intended to diagnose MRSA infection nor to guide or monitor treatment for MRSA infections. Test performance is not FDA approved in patients less than 64 years old. Performed at [facility], [place] [address], [place], [place] [address]     Coagulation Studies: Recent Labs    03/14/2021 1809 04/09/21 1450 04/10/21 0536 04/10/21 2240 04/11/21 0430  LABPROT 21.9* 27.0* 31.5* 32.8* 32.0*  INR 1.9* 2.5* 3.1* 3.2* 3.1*     Urinalysis: No results for input(s): COLORURINE, LABSPEC, PHURINE, GLUCOSEU, HGBUR, BILIRUBINUR, KETONESUR, PROTEINUR, UROBILINOGEN, NITRITE, LEUKOCYTESUR in the last 72 hours.  Invalid input(s): APPERANCEUR    Imaging: CT HEAD WO CONTRAST (5MM)  Result Date: 04/09/2021 CLINICAL DATA:  Mental status change, unknown cause. Intubated and unresponsive. EXAM: CT HEAD WITHOUT CONTRAST TECHNIQUE: Contiguous axial images were obtained from the base of the skull through the vertex without intravenous contrast. COMPARISON:  Prior head CT examinations 03/14/2021 and earlier. Brain MRI 06/17/2019. FINDINGS: Brain: Mild generalized cerebral and cerebellar atrophy. There are small infarcts within the bilateral cerebellar hemispheres which are new as compared to the brain MRI of 06/17/2019, but otherwise age-indeterminate. Redemonstrated chronic lacunar infarcts versus prominent perivascular spaces within the bilateral basal ganglia (series 2, image 16). In retrospect, these infarcts were present on the head CT head 04/02/2021. Redemonstrated chronic lacunar infarct within the right thalamus. Moderate patchy and ill-defined hypoattenuation within the cerebral white matter, nonspecific but compatible with chronic small vessel ischemic disease. There is no acute intracranial hemorrhage. No demarcated cortical infarct. No extra-axial fluid collection. No evidence of an intracranial mass. No midline shift. Vascular: No  hyperdense vessel.  Atherosclerotic calcifications. Skull: Normal. Negative for fracture or focal lesion. Sinuses/Orbits: Visualized orbits show no acute finding. No significant paranasal sinus disease at the imaged levels. IMPRESSION: Small infarcts within the bilateral cerebellar hemispheres are new as compared to the brain MRI of 06/17/2019, but otherwise age-indeterminate. Consider a brain MRI for further evaluation. Redemonstrated chronic lacunar infarcts versus prominent perivascular spaces within bilateral basal ganglia. Redemonstrated chronic lacunar infarct within the right thalamus. Moderate chronic small vessel ischemic changes within cerebral white matter. Mild generalized parenchymal atrophy. Electronically  Signed   By: Kellie Simmering D.O.   On: 04/09/2021 08:50   MR BRAIN WO CONTRAST  Result Date: 04/09/2021 CLINICAL DATA:  Mental status change, unknown cause. EXAM: MRI HEAD WITHOUT CONTRAST TECHNIQUE: Multiplanar, multiecho pulse sequences of the brain and surrounding structures were obtained without intravenous contrast. COMPARISON:  Head CT April 09, 2021 FINDINGS: The study is partially degraded by motion. Brain: A 9 mm focus of restricted diffusion is seen in the left cerebellar hemisphere. Scattered foci of restricted diffusion are also seen within the deep white matter of the left frontal lobe, as well as within the medial left frontal parietal cortex. No hemorrhage, hydrocephalus, extra-axial collection or mass lesion. Remote lacunar infarcts in the bilateral cerebellar hemispheres and right thalamus. Scattered and confluent foci of T2 hyperintensity are seen within the white matter of the cerebral hemispheres, nonspecific, most likely related to chronic small vessel ischemia. Moderate parenchymal volume loss. Focus of susceptibility artifact in the left amygdala suggesting hemosiderin deposit. Vascular: Normal flow voids. Skull and upper cervical spine: Normal marrow signal. Sinuses/Orbits:  Negative. Other: None. IMPRESSION: 1. Multiple small acute/subacute infarcts in the left ACA and left PICA territories, suggesting embolic events. 2. Moderate chronic microvascular ischemic changes of the white matter and parenchymal. Electronically Signed   By: Pedro Earls M.D.   On: 04/09/2021 17:04   DG CHEST PORT 1 VIEW  Result Date: 04/10/2021 CLINICAL DATA:  65 year old female status post central line placement. EXAM: PORTABLE CHEST - 1 VIEW COMPARISON:  Earlier the same day FINDINGS: The mediastinal contours are within normal limits. No cardiomegaly. Interval insertion of right internal jugular central venous catheter with the catheter tip in the superior vena cava. Unchanged position of the indwelling left internal jugular tunneled hemodialysis catheter with the tip in the right atrium, endotracheal tube with the tip in the midthoracic trachea, and enteric feeding tube courses off the inferior aspect of this image. The lungs are clear bilaterally without evidence of focal consolidation, pleural effusion, or pneumothorax. No acute osseous abnormality. IMPRESSION: No pneumothorax after placement of right internal jugular central line which appears appropriately positioned. Ruthann Cancer, MD [facility] Electronically Signed   By: Ruthann Cancer M.D.   On: 04/10/2021 14:59   DG Chest Port 1 View  Result Date: 04/10/2021 CLINICAL DATA:  Abnormal respiration EXAM: PORTABLE CHEST 1 VIEW COMPARISON:  Chest x-ray dated April 09, 2021 FINDINGS: Slight interval retraction of ET tube with tip positioned approximately 3.5 cm from the carina. Unchanged position of left central venous line. Enteric feeding tube partially seen coursing below the diaphragm. Cardiac and mediastinal contours are unchanged. Lungs are clear. No large pleural effusion or evidence of pneumothorax. IMPRESSION: Lungs are clear. Electronically Signed   By: Yetta Glassman M.D.   On: 04/10/2021 10:46   DG CHEST PORT 1 VIEW  Result Date: 04/09/2021 CLINICAL DATA:  History of heart failure, intubated EXAM: PORTABLE CHEST 1 VIEW COMPARISON:  Chest radiograph 04/04/2021 FINDINGS: The endotracheal tube terminates approximately 1.7 cm from the carina. The left dialysis catheter terminates in the right atrium. Defibrillator pads and additional leads overlie the patient. The cardiomediastinal silhouette is stable, with unchanged cardiomegaly. There is no focal consolidation or pulmonary edema. There is no pleural effusion or pneumothorax. The bones are stable. IMPRESSION: 1. Endotracheal tube terminates approximately 1.7 cm from the carina. 2. Unchanged cardiomegaly. 3. No radiographic evidence of acute cardiopulmonary process. Electronically Signed   By: Valetta Mole  M.D.   On: 04/09/2021 10:31   DG Abd Portable 1V  Result Date: 04/09/2021 CLINICAL DATA:  Feeding tube placement EXAM: PORTABLE ABDOMEN - 1 VIEW COMPARISON:  CT scan 03/21/2021 FINDINGS: Feeding tube extends into the stomach and terminates in the gastric body. There is some residual contrast medium in the transverse colon. Extensive atherosclerotic vascular calcifications noted. IMPRESSION: 1. The feeding tube tip is in the stomach body. 2. Atherosclerosis. Electronically Signed   By: Van Clines M.D.   On: 04/09/2021 18:23   EEG adult  Result Date: 04/09/2021 Lora Havens, MD     04/09/2021 11:32 AM Patient Name: Denese Mentink MRN: [id number] Epilepsy Attending: Lora Havens Referring Physician/Provider: Dr Su Monks Date: 04/09/2021 Duration: 23.17 mins Patient history: 65yo F with altered mental status with questionable left arm seizure activity. EEG to evaluate for seizure Level of alertness: Awake AEDs during EEG study: None Technical aspects: This EEG study was done with scalp electrodes positioned according to the 10-20 International system of electrode placement. Electrical  activity was acquired at a sampling rate of _0  and reviewed with a high frequency filter of _1  and a low frequency filter of _2 . EEG data were recorded continuously and digitally stored. Description: EEG showed continuous generalized 3 to 6 Hz theta-delta slowing, at times with triphasic morphology.  Hyperventilation and photic stimulation were not performed.   ABNORMALITY - Continuous slow, generalized IMPRESSION: This study is suggestive of moderate diffuse encephalopathy, nonspecific etiology. No seizures or epileptiform discharges were seen throughout the recording. Priyanka [name]   Overnight EEG with video  Result Date: 04/10/2021 Lora Havens, MD     04/10/2021  9:40 AM Patient Name: Georgi Tuel MRN: [id number] Epilepsy Attending: Lora Havens Referring Physician/Provider: Payton Doughty, NP Duration: 04/09/2021 1925 to 04/10/2021 0930  Patient history: 65yo F with altered mental status with questionable left arm seizure activity. EEG to evaluate for seizure  Level of alertness: Awake, asleep  AEDs during EEG study: None  Technical aspects: This EEG study was done with scalp electrodes positioned according to the 10-20 International system of electrode placement. Electrical activity was acquired at a sampling rate of _3  and reviewed with a high frequency filter of _4  and a low frequency filter of _5 . EEG data were recorded continuously and digitally stored.  Description: EEG showed continuous generalized 3 to 6 Hz theta-delta slowing.  Generalized periodic discharges with triphasic morphology at 1 to 1.5 Hz were also noted, predominantly when patient was awake/stimulated.  Hyperventilation and photic stimulation were not performed.    ABNORMALITY - Continuous slow, generalized - Periodic discharges with triphasic morphology, generalized  IMPRESSION: This study showed generalized periodic discharges with triphasic morphology which is on the ictal-interictal continuum.  However, the  morphology, frequency and reactivity to stimulation is more commonly seen due to toxic-metabolic causes.  There is also moderate diffuse encephalopathy, nonspecific etiology. No seizures were seen throughout the recording.  Lora Havens   ECHOCARDIOGRAM COMPLETE  Result Date: 04/09/2021    ECHOCARDIOGRAM REPORT   Patient Name:   CANDELA KRUL Date of Exam: 04/09/2021 Medical Rec #:  [id number]        Height:       67.0 in Accession #:    [id number]       Weight:       155.9 lb Date of Birth:  Oct 22, 1955        BSA:          1.819 m Patient Age:  65 years         BP:           103/67 mmHg Patient Gender: F                HR:           49 bpm. Exam Location:  Inpatient Procedure: 2D Echo, Cardiac Doppler, Color Doppler and Intracardiac            Opacification Agent Indications:    122-I22.9 Subsequent ST elevation (STEM) and non-ST elevation                 (NSTEMI) myocardial infarction  History:        Patient has prior history of Echocardiogram examinations, most                 recent 07/01/2019. Cardiomyopathy, Previous Myocardial                 Infarction, Acute MI and CAD, Abnormal ECG, Stroke,                 Arrythmias:Atrial Fibrillation,                 Signs/Symptoms:Dizziness/Lightheadedness and Dyspnea; Risk                 Factors:Hypertension. ESRD.  Sonographer:    Roseanna Rainbow RDCS Referring Phys: [id number] [place]  1. Left ventricular ejection fraction, by estimation, is 25 to 30%. The left ventricle has severely decreased function. The left ventricle demonstrates global hypokinesis. There is moderate left ventricular hypertrophy. Left ventricular diastolic function could not be evaluated. Elevated left ventricular end-diastolic pressure. There is akinesis of the left ventricular, mid-apical septal wall.  2. Right ventricular systolic function is mildly reduced. The right ventricular size is normal.  3. Left atrial size was moderately dilated.  4. Right atrial size  was mildly dilated.  5. The mitral valve is abnormal. Mild mitral valve regurgitation. No evidence of mitral stenosis. Moderate to severe mitral annular calcification.  6. The aortic valve is grossly normal. There is mild calcification of the aortic valve. There is mild thickening of the aortic valve. Aortic valve regurgitation is not visualized. Mild to moderate aortic valve sclerosis/calcification is present, without  any evidence of aortic stenosis. Comparison(s): Prior images reviewed side by side. Changes from prior study are noted. Conclusion(s)/Recommendation(s): Rhythm changed between sinus bradycardia and rate controlled atrial fibrillation during study. Overall, compared to prior the pattern of wall motion abnormalities is similar, but they are more pronounced on current study,  with slight decrease in EF from prior. Findings communicated with NP Reino Bellis. FINDINGS  Left Ventricle: Global hypokinesis with focal akinesis of the mid to distal anterior and inferior septum. Left ventricular ejection fraction, by estimation, is 25 to 30%. The left ventricle has severely decreased function. The left ventricle demonstrates global hypokinesis. The left ventricular internal cavity size was normal in size. There is moderate left ventricular hypertrophy. Left ventricular diastolic function could not be evaluated due to atrial fibrillation. Left ventricular diastolic function could not be evaluated. Elevated left ventricular end-diastolic pressure. Right Ventricle: The right ventricular size is normal. Right vetricular wall thickness was not well visualized. Right ventricular systolic function is mildly reduced. Left Atrium: Left atrial size was moderately dilated. Right Atrium: Right atrial size was mildly dilated. Pericardium: There is no evidence of pericardial effusion. Mitral Valve: The mitral valve is abnormal. There is mild thickening of the mitral  valve leaflet(s). There is mild calcification of the  mitral valve leaflet(s). Moderate to severe mitral annular calcification. Mild mitral valve regurgitation. No evidence  of mitral valve stenosis. Tricuspid Valve: The tricuspid valve is normal in structure. Tricuspid valve regurgitation is trivial. No evidence of tricuspid stenosis. Aortic Valve: The aortic valve is grossly normal. There is mild calcification of the aortic valve. There is mild thickening of the aortic valve. There is moderate aortic valve annular calcification. Aortic valve regurgitation is not visualized. Mild to moderate aortic valve sclerosis/calcification is present, without any evidence of aortic stenosis. Pulmonic Valve: The pulmonic valve was not well visualized. Pulmonic valve regurgitation is not visualized. No evidence of pulmonic stenosis. Aorta: The aortic root, ascending aorta and aortic arch are all structurally normal, with no evidence of dilitation or obstruction. Venous: IVC assessment for right atrial pressure unable to be performed due to mechanical ventilation. IAS/Shunts: The interatrial septum appears to be lipomatous. The atrial septum is grossly normal.  LEFT VENTRICLE PLAX 2D LVIDd:         4.80 cm      Diastology LVIDs:         4.57 cm      LV e' medial:    2.13 cm/s LV PW:         1.80 cm      LV E/e' medial:  46.1 LV IVS:        1.20 cm      LV e' lateral:   2.13 cm/s LVOT diam:     1.90 cm      LV E/e' lateral: 46.1 LV SV:         40 LV SV Index:   22 LVOT Area:     2.84 cm  LV Volumes (MOD) LV vol d, MOD A2C: 97.9 ml LV vol d, MOD A4C: 115.3 ml LV vol s, MOD A2C: 65.4 ml LV vol s, MOD A4C: 69.9 ml LV SV MOD A2C:     32.5 ml LV SV MOD A4C:     115.3 ml LV SV MOD BP:      39.0 ml RIGHT VENTRICLE            IVC RV S prime:     5.70 cm/s  IVC diam: 1.90 cm TAPSE (M-mode): 1.5 cm LEFT ATRIUM             Index       RIGHT ATRIUM           Index LA diam:        3.40 cm 1.87 cm/m  RA Area:     15.70 cm LA Vol (A2C):   69.9 ml 38.43 ml/m RA Volume:   44.20 ml  24.30 ml/m LA  Vol (A4C):   44.6 ml 24.52 ml/m LA Biplane Vol: 56.4 ml 31.01 ml/m  AORTIC VALVE LVOT Vmax:   70.70 cm/s LVOT Vmean:  39.200 cm/s LVOT VTI:    0.140 m  AORTA Ao Root diam: 3.10 cm Ao Asc diam:  3.00 cm MITRAL VALVE MV Area (PHT): 3.31 cm    SHUNTS MV Decel Time: 229 msec    Systemic VTI:  0.14 m MV E velocity: 98.10 cm/s  Systemic Diam: 1.90 cm Buford Dresser MD Electronically signed by Buford Dresser MD Signature Date/Time: 04/09/2021/6:24:41 PM    Final    US LIVER DOPPLER  Result Date: 04/10/2021 CLINICAL DATA:  Transaminitis EXAM: DUPLEX ULTRASOUND OF LIVER TECHNIQUE: Color and duplex Doppler ultrasound was performed to  evaluate the hepatic in-flow and out-flow vessels. COMPARISON:  CT 03/22/2021 FINDINGS: Liver: Normal parenchymal echogenicity. Normal hepatic contour without nodularity. No focal lesion, mass or intrahepatic biliary ductal dilatation. Main Portal Vein size: 0.9 cm Portal Vein Velocities (all hepatopetal): Main Prox:  20 cm/sec Main Mid: 14 cm/sec Main Dist:  11 cm/sec Right: 9 cm/sec Left: 9 cm/sec Hepatic Vein Velocities (all hepatofugal): Right:  9 cm/sec Middle:  10 cm/sec Left:  15 cm/sec IVC: Present and patent with normal respiratory phasicity. Hepatic Artery Velocity:  106 cm/sec Splenic Vein Velocity:  12 cm/sec Spleen: 8.8 cm x 3.9 cm x 3.5 cm with a total volume of 62 cm^3 (411 cm^3 is upper limit normal) Portal Vein Occlusion/Thrombus: No Splenic Vein Occlusion/Thrombus: No Ascites: None Varices: None IMPRESSION: Unremarkable hepatic vascular Doppler evaluation. Electronically Signed   By: Lucrezia Europe M.D.   On: 04/10/2021 08:12     Medications:     prismasol BGK 4/2.5 500 mL/hr at 04/11/21 0747    prismasol BGK 4/2.5 500 mL/hr at 04/11/21 0746   acetylcysteine 6.25 mg/kg/hr (04/11/21 0700)   feeding supplement (VITAL 1.5 CAL) 50 mL/hr at 04/10/21 1700   levETIRAcetam Stopped (04/11/21 0035)   norepinephrine (LEVOPHED) Adult infusion 12 mcg/min (04/11/21  0700)   piperacillin-tazobactam Stopped (04/11/21 0653)   prismasol BGK 4/2.5 2,000 mL/hr at 04/11/21 0747   vancomycin      B-complex with vitamin C  1 tablet Per Tube Daily   chlorhexidine gluconate (MEDLINE KIT)  15 mL Mouth Rinse BID   Chlorhexidine Gluconate Cloth  6 each Topical Daily   docusate  100 mg Per Tube BID   famotidine  10 mg Per Tube Daily   feeding supplement (PROSource TF)  45 mL Per Tube TID   heparin injection (subcutaneous)  5,000 Units Subcutaneous Q8H   insulin aspart  0-6 Units Subcutaneous Q4H   mouth rinse  15 mL Mouth Rinse 10 times per day   polyethylene glycol  17 g Per Tube Daily   sodium chloride flush  10-40 mL Intracatheter Q12H   sodium chloride flush  3 mL Intravenous Q12H   alteplase, fentaNYL (SUBLIMAZE) injection, fentaNYL (SUBLIMAZE) injection, heparin, midazolam, midazolam, sodium chloride, sodium chloride flush  Assessment/ Plan:  Dialysis Orders: [facility] MWF 4HR 15MIN, 3K, 2.ca , EDW 70.0 kg just lowered half kilogram  Right IJ PC ,LUA AVGG inserted 7/22/220 Mircera 60 MCG on 03/19/21 2000 units heparin heparin Calcitriol 1.75 MCG   Assessment/Plan Non-STEMI= plan per admit/cardiology consulted.  She was scheduled for cardiac catheterization but was having chest pain and refused. 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 ESRD -HD MWF patient underwent dialysis 04/02/2021.  Patient initiated CRRT 04/09/2021 Hypertension/volume  -continue CRRT Mild hyperkalemia= improved with CRRT History of ischemic cardiomyopathy EF 35% in 2020 Anemia of ESRD-hemoglobin stable at this point Metabolic bone disease -try on hemodialysis binders when taking p.o.'s with meals Type 2 diabetes mellitus= per admit team Nutrition -when eating carb modified/renal with fluid restriction      LOS: [place] _0 _1 :58 AM

## 2021-04-11 NOTE — Progress Notes (Addendum)
[name] Progress Note Patient Name: Amber Young DOB: 1955-08-31 MRN: [id number]   Date of Service  04/11/2021  HPI/Events of Note  CTA findings reviewed.  eICU Interventions  Pharmacy has been given okay to start fixed dose Heparin gtt as previously instructed, I have placed a page to  neurology on call to update them and have them review the CTA images regarding next steps. ADDENDUM Dr. Lorrin Goodell of neurology called me back and I updated him and requested that he review the CTA images.        [name] [name] 04/11/2021, 8:40 PM

## 2021-04-11 NOTE — Progress Notes (Signed)
[facility] Progress Note Patient Name: Amber Young DOB: [birth year] MRN: [id number]   Date of Service  04/11/2021  HPI/Events of Note  Repeat INR is 3.2.  eICU Interventions  Will defer anticoagulation pending re-evaluation by neurology in the A.M.        Amber Young Amber Young 04/11/2021, 1:00 AM

## 2021-04-11 NOTE — Procedures (Addendum)
Patient Name: Faviana Sandvick  MRN: [id number]  Epilepsy Attending: Lora Havens  Referring Physician/Provider: Payton Doughty, NP Duration: 04/10/2021 1925 to 04/11/2021 [id number]   Patient history: 65yo F with altered mental status with questionable left arm seizure activity. EEG to evaluate for seizure   Level of alertness: Awake, asleep   AEDs during EEG study: None   Technical aspects: This EEG study was done with scalp electrodes positioned according to the 10-20 International system of electrode placement. Electrical activity was acquired at a sampling rate of '500Hz'$  and reviewed with a high frequency filter of '70Hz'$  and a low frequency filter of '1Hz'$ . EEG data were recorded continuously and digitally stored.    Description: EEG showed continuous generalized 3 to 6 Hz theta-delta slowing, at times with triphasic morphology. Hyperventilation and photic stimulation were not performed.      ABNORMALITY - Continuous slow, generalized   IMPRESSION: This study is suggestive of moderate diffuse encephalopathy, nonspecific etiology but could be secondary to toxic-metabolic causes. No seizures were seen throughout the recording.   Celise Bazar [name]

## 2021-04-11 NOTE — Progress Notes (Signed)
[facility] Progress Note    Since last GI note: Remains intubated. She is on pressor now.  Objective: Vital signs in last 24 hours: Temp:  [91.5 F (33.1 C)-97.6 F (36.4 C)] 97.6 F (36.4 C) (09/02 0746) Pulse Rate:  [43-109] 53 (09/02 0700) Resp:  [16-27] 21 (09/02 0700) BP: (65-150)/(40-80) 99/40 (09/02 0700) SpO2:  [85 %-100 %] 100 % (09/02 0700) FiO2 (%):  [40 %] 40 % (09/02 0321) Weight:  [68.3 kg] 68.3 kg (09/02 0442) Last BM Date: 04/10/21 General: intubated Heart: regular rate and rythm Abdomen: soft non-distended, normal bowel sounds  Lab Results: Recent Labs    04/09/21 0248 04/09/21 0930 04/10/21 0500 04/11/21 0430  WBC 12.2*  --  18.3* 20.0*  HGB 11.2* 10.5* 10.0* 10.5*  PLT 297  --  246 250  MCV 83.5  --  82.4 84.9   Recent Labs    04/10/21 0500 04/10/21 1830 04/11/21 0430  NA 137 138 133*  K 4.0 3.3* 3.8  CL 100 100 100  CO2 21* 22 21*  GLUCOSE 99 228* 223*  BUN 39* 21 17  CREATININE 4.72* 2.24* 1.68*  CALCIUM 8.8* 8.4* 8.7*   Recent Labs    04/09/21 0248 04/09/21 1450 04/10/21 0500 04/10/21 1830 04/11/21 0430  PROT 6.7  --  6.2*  --  6.1*  ALBUMIN 3.2*   < > 3.0*  2.9* 2.4* 2.5*  AST 1,363*  --  9,649*  --  4,440*  ALT 372*  --  2,575*  --  1,923*  ALKPHOS 115  --  119  --  151*  BILITOT 1.5*  --  2.2*  --  2.3*  BILIDIR  --   --  0.6*  --   --   IBILI  --   --  1.6*  --   --    < > = values in this interval not displayed.   Recent Labs    04/10/21 2240 04/11/21 0430  INR 3.2* 3.1*    Medications: Scheduled Meds:  B-complex with vitamin C  1 tablet Per Tube Daily   chlorhexidine gluconate (MEDLINE KIT)  15 mL Mouth Rinse BID   Chlorhexidine Gluconate Cloth  6 each Topical Daily   docusate  100 mg Per Tube BID   famotidine  10 mg Per Tube Daily   feeding supplement (PROSource TF)  45 mL Per Tube TID   heparin injection (subcutaneous)  5,000 Units Subcutaneous Q8H   insulin aspart  0-6 Units Subcutaneous  Q4H   mouth rinse  15 mL Mouth Rinse 10 times per day   polyethylene glycol  17 g Per Tube Daily   sodium chloride flush  10-40 mL Intracatheter Q12H   sodium chloride flush  3 mL Intravenous Q12H   Continuous Infusions:   prismasol BGK 4/2.5 500 mL/hr at 04/11/21 0747    prismasol BGK 4/2.5 500 mL/hr at 04/11/21 0746   acetylcysteine 6.25 mg/kg/hr (04/11/21 0700)   feeding supplement (VITAL 1.5 CAL) 50 mL/hr at 04/10/21 1700   levETIRAcetam Stopped (04/11/21 0035)   norepinephrine (LEVOPHED) Adult infusion 12 mcg/min (04/11/21 0700)   piperacillin-tazobactam Stopped (04/11/21 0653)   prismasol BGK 4/2.5 2,000 mL/hr at 04/11/21 0747   vancomycin     PRN Meds:.alteplase, fentaNYL (SUBLIMAZE) injection, fentaNYL (SUBLIMAZE) injection, heparin, midazolam, midazolam, sodium chloride, sodium chloride flush  Assessment/Plan: 65 y.o. female with acute hepatitis that is most likely due to an ischemic process (shock liver)  No biliary obstruction on imaging.  CMV, EBV,  ANA all still pending but acute hep viral panel for hep A/B/C was negative.  Vit 91m given yesterday and she will get another dose today and tomorrow to try to reverse her elevated INR as much as possible. NAS protocol started yesterday. This may help, data is limited, and is very unlikely to make matters worse.  LFTs are overall much improved, this points rather strongly toward an ischemic hepatitis. Likely her bili will temporarily rise over the next few days which is a common trend with shock liver.  Should continue full supportive measures.  Noted that she is now on pressors.   DMilus Banister MD  04/11/2021, 8:24 AM [facility] Pager ([phone]

## 2021-04-11 NOTE — Progress Notes (Signed)
PHARMACY - PHYSICIAN COMMUNICATION CRITICAL VALUE ALERT - BLOOD CULTURE IDENTIFICATION (BCID)  Amber Young is an 65 y.o. female who presented to [facility] on 04/04/2021 with NSTEMI.  Currently on full vent support.  History includes atrial fibrillation, systolic and diastolic heart failure, end-stage renal disease.  CVA, possible seizures, hepatic dysfunction.  Recent MRI showed multiple embolic events, she is on CRRT with fluid Gross of 50 cc an hour, on pressors for hypotension.    Assessment:   Afebrile, WBC elevated at 20.   1 out of 8 blood cultures positive for streptococcus (negative for S.pneumoniae, S.agalactiae, and S. pyogenes)  Name of physician (or Provider) ContactedAnder Slade  Current antibiotics: vancomycin and Zosyn  Changes to prescribed antibiotics recommended:  Discontinue vancomycin and Zosyn.   Rocephin 2 g IV every 24 hours accepted by provider.  Results for orders placed or performed during the hospital encounter of 03/16/2021  Blood Culture ID Panel (Reflexed) (Collected: 04/10/2021  2:45 PM)  Result Value Ref Range   Enterococcus faecalis NOT DETECTED NOT DETECTED   Enterococcus Faecium NOT DETECTED NOT DETECTED   Listeria monocytogenes NOT DETECTED NOT DETECTED   Staphylococcus species NOT DETECTED NOT DETECTED   Staphylococcus aureus (BCID) NOT DETECTED NOT DETECTED   Staphylococcus epidermidis NOT DETECTED NOT DETECTED   Staphylococcus lugdunensis NOT DETECTED NOT DETECTED   Streptococcus species DETECTED (A) NOT DETECTED   Streptococcus agalactiae NOT DETECTED NOT DETECTED   Streptococcus pneumoniae NOT DETECTED NOT DETECTED   Streptococcus pyogenes NOT DETECTED NOT DETECTED   A.calcoaceticus-baumannii NOT DETECTED NOT DETECTED   Bacteroides fragilis NOT DETECTED NOT DETECTED   Enterobacterales NOT DETECTED NOT DETECTED   Enterobacter cloacae complex NOT DETECTED NOT DETECTED   Escherichia coli NOT DETECTED NOT DETECTED   Klebsiella aerogenes NOT  DETECTED NOT DETECTED   Klebsiella oxytoca NOT DETECTED NOT DETECTED   Klebsiella pneumoniae NOT DETECTED NOT DETECTED   Proteus species NOT DETECTED NOT DETECTED   Salmonella species NOT DETECTED NOT DETECTED   Serratia marcescens NOT DETECTED NOT DETECTED   Haemophilus influenzae NOT DETECTED NOT DETECTED   Neisseria meningitidis NOT DETECTED NOT DETECTED   Pseudomonas aeruginosa NOT DETECTED NOT DETECTED   Stenotrophomonas maltophilia NOT DETECTED NOT DETECTED   Candida albicans NOT DETECTED NOT DETECTED   Candida auris NOT DETECTED NOT DETECTED   Candida glabrata NOT DETECTED NOT DETECTED   Candida krusei NOT DETECTED NOT DETECTED   Candida parapsilosis NOT DETECTED NOT DETECTED   Candida tropicalis NOT DETECTED NOT DETECTED   Cryptococcus neoformans/gattii NOT DETECTED NOT DETECTED    Suzzanne Cloud, PharmD, BCPS Clinical Pharmacist [place] Please utilize Amion for appropriate phone number to reach the unit pharmacist 04/11/2021 3:44 PM

## 2021-04-11 NOTE — Progress Notes (Signed)
Brief Neuro Update:  Reviewed CT Angio head and neck which is notable for multifocal multivessel extra and inctracranial stenosis. In addition, there is a rounded filling defect in the left paraclinoid ICA could represent atherosclerotic plaque and/or intraluminal thrombus. Likely from her Afibb vs potential thrombus on top of an unstable or ruptured plaque. She is on Anticoagulation with Heparin gtt which I think should be sufficient.  Discussed with Dr. Lucile Shutters.  [place] Pager Number [phone]

## 2021-04-11 NOTE — Progress Notes (Signed)
Inpatient Diabetes Program Recommendations  AACE/ADA: New Consensus Statement on Inpatient Glycemic Control (2015)  Target Ranges:  Prepandial:   less than 140 mg/dL      Peak postprandial:   less than 180 mg/dL (1-2 hours)      Critically ill patients:  140 - 180 mg/dL   Lab Results  Component Value Date   GLUCAP 226 (H) 04/11/2021   HGBA1C 7.0 (H) 03/11/2021    Review of Glycemic Control Results for ALYRICA, SEMON (MRN [id number]) as of 04/11/2021 09:39  Ref. Range 04/10/2021 07:31 04/10/2021 11:28 04/10/2021 15:08 04/10/2021 19:47 04/10/2021 20:49 04/10/2021 23:35 04/11/2021 03:17 04/11/2021 07:44  Glucose-Capillary Latest Ref Range: 70 - 99 mg/dL 133 (H) 154 (H) 195 (H)  Tube feeds at goal rate at 1700 211 (H) 298 (H) 276 (H)  Novolog 3 units 175 (H)  Novolog 1 unit 226 (H)  Novolog 2 units   Diabetes history: DM 2 Outpatient Diabetes medications: Glipizide 10 mg bid, lantus 4 units prn Current orders for Inpatient glycemic control:  Novolog 0-6 units Q4 hours  Inpatient Diabetes Program Recommendations:    - consider Novolog 1-2 units Q4 hours tube feed coverage  Thanks,  Tama Headings RN, MSN, BC-ADM Inpatient Diabetes Coordinator Team Pager [phone] (8a-5p)

## 2021-04-11 NOTE — Consult Note (Addendum)
NAME:  Amber Young, MRN:  [id number], DOB:  June 16, 1956, LOS: 4 ADMISSION DATE:  03/10/2021, CONSULTATION DATE: 04/09/2021 REFERRING MD: [facility] hospitalist, CHIEF COMPLAINT: Acute respiratory failure and altered mental status in the setting of benign elevated ST MI with troponins of 14,000 and in a patient with end-stage renal disease.  History of Present Illness:  Amber Young is a 65 year old female her of health issues significant for but not limited to atrial fibrillation combined systolic and diastolic heart failure with last 2D showing EF of 40 to 45% in 2019.  She has also end-stage renal disease and has a tunneled catheter and a new AV graft placed on 8/25 left forearm.  She had the graft placed on 8/25 and return to [facility] on 04/02/2021 with general malaise not feeling well left-sided chest pain troponin greater than 14,000.  She was scheduled for cardiac catheterization on 03/19/2021 but patient was unable to complete at a time due to pain and discomfort and refusing the procedure. 04/09/2021 she has been transferred to the intensive care unit full mechanical ventilatory support anticoagulation is on hold neurology will be consulted and continue current level of intensive care treatment.   Pertinent  Medical History   Past Medical History:  Diagnosis Date   Anemia    Atrial fibrillation ([place]) 06/17/2019   Cataract, bilateral    Chronic combined systolic and diastolic CHF (congestive heart failure) ([place])    a. EF 40-45% in 2019 b. EF 25-30% by repeat echo in 01/2019   CKD (chronic kidney disease), stage IV (HCC)    Coronary artery disease 08/2017   DES x 2 RCA   Dialysis patient ([place]) 07/2019   DKA (diabetic ketoacidoses) 06/16/2019   GERD (gastroesophageal reflux disease)    Glaucoma, left eye    Heart murmur    Hyperlipidemia    Hypertension    Proliferative diabetic retinopathy ([place])    left eye with vitreous hemorrhage and tractional retinal detachment   Stroke  ([place]) 09/2014   numbness left upper lip, finger tips on left hand; "resolved" (05/18/2016)   Type II diabetes mellitus ([place])    Wears glasses      Significant Hospital Events: Including procedures, antibiotic start and stop dates in addition to other pertinent events   04/09/2021 intubated on the floor for anesthesia  Interim History / Subjective:   MRI brain with multiple small infarcts in left ACA, PICA territories suggestive of embolic events. Started on CRRT 04/09/2021. Now pulling 50 cc per hour Now on Levophed for hypotension , started 9/1 at 1800. Currently on 10 mcg/min Co-ox is 56%  Also on Acetylcysteine for hepatic protection   Labs reviewed 9/2 Na 133 K 3.8, CO2 21, Creatinine 1.68, Mag 2.6, Alk Phos 151. AST 4,440, ( 9649), ALT 1923 ((2575) WBC 20, HGB 10.5, platelets 250, INR 3.1, TEG values are all WNL, will wait for CTA brain  to determine when to resume anticoagulation If ok to resume, will start at a low set rate of 500 units hour Continuous EEG has been discontinued to take patient for MRA today.  CVVHD continues pulling 500 cc per hour CK 7.3 on 9.1 Sed rate WNL on 8/31  Objective   Blood pressure (!) 99/40, pulse (!) 53, temperature 97.6 F (36.4 C), temperature source Axillary, resp. rate (!) 21, height '5\' 7"'  (1.702 m), weight 68.3 kg, SpO2 100 %.    Vent Mode: PRVC FiO2 (%):  [40 %] 40 % Set Rate:  [18 bmp] 18 bmp  Vt Set:  [490 mL] 490 mL PEEP:  [5 cmH20] 5 cmH20 Plateau Pressure:  [17 cmH20-20 cmH20] 20 cmH20   Intake/Output Summary (Last 24 hours) at 04/11/2021 0849 Last data filed at 04/11/2021 0700 Gross per 24 hour  Intake 2506.59 ml  Output 1980 ml  Net 526.59 ml   Filed Weights   04/09/21 0903 04/10/21 0500 04/11/21 0442  Weight: 70.7 kg 68.7 kg 68.3 kg    Examination: General appearance: 65 y.o., female, intubated, no sedation , in NAD Eyes: anicteric sclerae, moist conjunctivae; no lid-lag, partially open HENT: NCAT; dry MMm Cor Track  feeding tube Neck: Trachea midline; no lymphadenopathy, no JVD Lungs: Bilateral chest excursion, mechanical breath sounds, Clear to auscultation , no crackles, no wheeze, with normal respiratory effort CV: RRR, no MRGs  Abdomen: Soft, non-tender; non-distended, BS present,Body mass index is 23.58 kg/m.  Extremities: trace edema, radial and DP pulses present bilaterally , no obvious deformities Skin: Normal temperature, turgor and texture; no rash, currently on [place] [employer] Neuro: opens eyes and ?  Tracking when standing on her left , does not follow commands, withdraws LUE to pain. Single tear as her husband speaks with her    9/1 MRI brain >> shows an acute left cerebellar stroke, as well as remote strokes at bilateral cerebellar hemispheres and right thalamus, suggestive of embolic eents affecting the left ACA and left PICA territories EEG 04/10/2021>> This study is suggestive of moderate diffuse encephalopathy, nonspecific etiology but could be secondary to toxic-metabolic causes. No seizures were seen throughout the recording.   2D echo 04/09/21  1. Left ventricular ejection fraction, by estimation, is 25 to 30%. The  left ventricle has severely decreased function. The left ventricle  demonstrates global hypokinesis. There is moderate left ventricular  hypertrophy. Left ventricular diastolic  function could not be evaluated. Elevated left ventricular end-diastolic  pressure. There is akinesis of the left ventricular, mid-apical septal  wall.   2. Right ventricular systolic function is mildly reduced. The right  ventricular size is normal.   3. Left atrial size was moderately dilated.   4. Right atrial size was mildly dilated.   5. The mitral valve is abnormal. Mild mitral valve regurgitation. No  evidence of mitral stenosis. Moderate to severe mitral annular  calcification.   6. The aortic valve is grossly normal. There is mild calcification of the  aortic valve. There is mild  thickening of the aortic valve. Aortic valve  regurgitation is not visualized. Mild to moderate aortic valve  sclerosis/calcification is present, without   any evidence of aortic stenosis.   Comparison(s): Prior images reviewed side by side. Changes from prior  study are noted.   Conclusion(s)/Recommendation(s): Rhythm changed between sinus bradycardia  and rate controlled atrial fibrillation during study. Overall, compared to  prior the pattern of wall motion abnormalities is similar, but they are  more pronounced on current study,   with slight decrease in EF from prior.  No CXR imaging 9/2 to review   Resolved Hospital Problem list     Assessment & Plan:  Ventilator dependent respiratory failure in the setting of altered mental status, bradycardia, metabolic disarray, non-ST elevation MI with troponins 14,000 questionable seizure activity left arm. Full vent support No sedation for neuro prognostication  ABG prn CXR prn Wean FiO2 and PEEP as able  Neuro status currently barrier to weaning  Altered mental status with questionable left arm seizure activity. Stroke :  bilateral   infarct embolic secondary to atrial fibrillation, MI,  and off anticoagulation for recent surgery  New strokes but I'm unsure if this fully explains her acute on chronic neurologic deterioration. Also has recently has worsening AGMA that seems out of proportion to her mildly elevated BUN. Developing leukocytosis which raises possibility of infectious process but BCx negative, CXR clear, she has no concerning secretions, she has been normothermic and she is anuric at baseline and I'm not sure I'd rely on the results of a UA/UCx in that setting. CTA Brain 9/2 CT Head without contrast 9/2 CTA Neck 9/2 EEG results as noted above Appreciate Neuro assist Continue Keppra per neuro Monitor for seizures CT Brain 9/2>> once reviewed will determine anticoagulation or not Will start Lactulose for elevated  ammonia  Non-ST elevation MI troponin is noted to be 14,000.  Unable do cardiac cath due to altered mental status and metabolic disarray patient refused procedure ,Atrial fib.  History of congestive heart failure Co-ox is 56% on 9/2 2D Echo  LVEF 25-30%, LA dilated, lipotamous interatrial septum Cardiology following, cath on hold pending further workup of encephalopathy Trend troponin EKG prn Trend Co-ox Continue Levophed for low Co-ox Trend Lactate   Diabetes mellitus CBG's are elevated 04/2021 Sliding scale insulin Will change to moderate scale   End-stage renal disease Tunneled HD catheter is in place New left AV graft placed on 04/03/2021 Nephrology is following CRRT she has tunneled cath, pull UF Trend BMET Replete electrolytes as needed   Elevated liver enzymes, remain elevated but are down trending Elevated INR 3.1 on 9/2 Monitor. INR elevated, ammonia elevated, suspect shock liver 2/2 ischemic insult F/u US doppler Appreciate  GI assist if persistent/worsening transaminitis and doppler unrevealing  Received Vit K 5 mg 9/1 will not repeat Trend LFT's and bili Continue Acetylcysteine through 04/13/2021, can reassess ad infuse for an additional day  if LFT's remain elevated and INR remains elevated Trend PT/ INR  Leukocytosis ?stress response.  Follow cultures Trend fever curve and WBC Culture as is clinically indicated  Will trend PCT 9/2     Best Practice (right click and "Reselect all SmartList Selections" daily)   Diet/type: Tube Feeds DVT prophylaxis: Prophylactic heparin GI prophylaxis: PPI Lines: Central line Foley:  Yes, and it is still needed Code Status:  full code Last date of multidisciplinary goals of care discussion [ Updated husband at bedside 04/11/2021   Critical care time: 50 minutes    This patient is critically ill with multiple organ system failure; which, requires frequent high complexity decision making, assessment, support,  evaluation, and titration of therapies. This was completed through the application of advanced monitoring technologies and extensive interpretation of multiple databases. During this encounter critical care time was devoted to patient care services described in this note for 50 minutes.   Magdalen Spatz, MSN, AGACNP-BC [place] for personal pager PCCM on call pager [phone]  04/11/2021, 8:49 AM

## 2021-04-12 ENCOUNTER — Inpatient Hospital Stay (HOSPITAL_COMMUNITY): Payer: Medicare Other

## 2021-04-12 DIAGNOSIS — I214 Non-ST elevation (NSTEMI) myocardial infarction: Secondary | ICD-10-CM | POA: Diagnosis not present

## 2021-04-12 DIAGNOSIS — I6310 Cerebral infarction due to embolism of unspecified precerebral artery: Secondary | ICD-10-CM

## 2021-04-12 LAB — POCT I-STAT 7, (LYTES, BLD GAS, ICA,H+H)
Acid-base deficit: 1 mmol/L (ref 0.0–2.0)
Bicarbonate: 22.7 mmol/L (ref 20.0–28.0)
Calcium, Ion: 1.16 mmol/L (ref 1.15–1.40)
HCT: 36 % (ref 36.0–46.0)
Hemoglobin: 12.2 g/dL (ref 12.0–15.0)
O2 Saturation: 99 %
Patient temperature: 97.7
Potassium: 3.9 mmol/L (ref 3.5–5.1)
Sodium: 139 mmol/L (ref 135–145)
TCO2: 24 mmol/L (ref 22–32)
pCO2 arterial: 32.5 mmHg (ref 32.0–48.0)
pH, Arterial: 7.45 (ref 7.350–7.450)
pO2, Arterial: 109 mmHg — ABNORMAL HIGH (ref 83.0–108.0)

## 2021-04-12 LAB — CBC WITH DIFFERENTIAL/PLATELET
Abs Immature Granulocytes: 0 10*3/uL (ref 0.00–0.07)
Basophils Absolute: 0 10*3/uL (ref 0.0–0.1)
Basophils Relative: 0 %
Eosinophils Absolute: 0 10*3/uL (ref 0.0–0.5)
Eosinophils Relative: 0 %
HCT: 32.5 % — ABNORMAL LOW (ref 36.0–46.0)
Hemoglobin: 10.6 g/dL — ABNORMAL LOW (ref 12.0–15.0)
Lymphocytes Relative: 5 %
Lymphs Abs: 1.3 10*3/uL (ref 0.7–4.0)
MCH: 27.4 pg (ref 26.0–34.0)
MCHC: 32.6 g/dL (ref 30.0–36.0)
MCV: 84 fL (ref 80.0–100.0)
Monocytes Absolute: 0.8 10*3/uL (ref 0.1–1.0)
Monocytes Relative: 3 %
Neutro Abs: 24.7 10*3/uL — ABNORMAL HIGH (ref 1.7–7.7)
Neutrophils Relative %: 92 %
Platelets: 245 10*3/uL (ref 150–400)
RBC: 3.87 MIL/uL (ref 3.87–5.11)
RDW: 17.7 % — ABNORMAL HIGH (ref 11.5–15.5)
WBC: 26.8 10*3/uL — ABNORMAL HIGH (ref 4.0–10.5)
nRBC: 15.6 % — ABNORMAL HIGH (ref 0.0–0.2)
nRBC: 39 /100 WBC — ABNORMAL HIGH

## 2021-04-12 LAB — RENAL FUNCTION PANEL
Albumin: 2.4 g/dL — ABNORMAL LOW (ref 3.5–5.0)
Anion gap: 10 (ref 5–15)
BUN: 12 mg/dL (ref 8–23)
CO2: 23 mmol/L (ref 22–32)
Calcium: 8.9 mg/dL (ref 8.9–10.3)
Chloride: 103 mmol/L (ref 98–111)
Creatinine, Ser: 1.02 mg/dL — ABNORMAL HIGH (ref 0.44–1.00)
GFR, Estimated: >60 mL/min (ref >60)
Glucose, Bld: 277 mg/dL — ABNORMAL HIGH (ref 70–99)
Phosphorus: 1.5 mg/dL — ABNORMAL LOW (ref 2.5–4.6)
Potassium: 4.3 mmol/L (ref 3.5–5.1)
Sodium: 136 mmol/L (ref 135–145)

## 2021-04-12 LAB — COMPREHENSIVE METABOLIC PANEL
ALT: 1440 U/L — ABNORMAL HIGH (ref 0–44)
AST: 2240 U/L — ABNORMAL HIGH (ref 15–41)
Albumin: 2.4 g/dL — ABNORMAL LOW (ref 3.5–5.0)
Alkaline Phosphatase: 203 U/L — ABNORMAL HIGH (ref 38–126)
Anion gap: 10 (ref 5–15)
BUN: 14 mg/dL (ref 8–23)
CO2: 23 mmol/L (ref 22–32)
Calcium: 8.8 mg/dL — ABNORMAL LOW (ref 8.9–10.3)
Chloride: 104 mmol/L (ref 98–111)
Creatinine, Ser: 1.23 mg/dL — ABNORMAL HIGH (ref 0.44–1.00)
GFR, Estimated: 49 mL/min — ABNORMAL LOW (ref >60)
Glucose, Bld: 233 mg/dL — ABNORMAL HIGH (ref 70–99)
Potassium: 3.9 mmol/L (ref 3.5–5.1)
Sodium: 137 mmol/L (ref 135–145)
Total Bilirubin: 2 mg/dL — ABNORMAL HIGH (ref 0.3–1.2)
Total Protein: 5.9 g/dL — ABNORMAL LOW (ref 6.5–8.1)

## 2021-04-12 LAB — GLUCOSE, CAPILLARY
Glucose-Capillary: 224 mg/dL — ABNORMAL HIGH (ref 70–99)
Glucose-Capillary: 223 mg/dL — ABNORMAL HIGH (ref 70–99)
Glucose-Capillary: 248 mg/dL — ABNORMAL HIGH (ref 70–99)
Glucose-Capillary: 241 mg/dL — ABNORMAL HIGH (ref 70–99)
Glucose-Capillary: 261 mg/dL — ABNORMAL HIGH (ref 70–99)
Glucose-Capillary: 202 mg/dL — ABNORMAL HIGH (ref 70–99)

## 2021-04-12 LAB — HEPARIN LEVEL (UNFRACTIONATED)
Heparin Unfractionated: <0.1 IU/mL — ABNORMAL LOW (ref 0.30–0.70)
Heparin Unfractionated: 0.36 IU/mL (ref 0.30–0.70)
Heparin Unfractionated: 0.5 IU/mL (ref 0.30–0.70)

## 2021-04-12 LAB — CULTURE, BLOOD (ROUTINE X 2)
Culture: NO GROWTH
Culture: NO GROWTH
Special Requests: ADEQUATE

## 2021-04-12 LAB — COOXEMETRY PANEL
Carboxyhemoglobin: 0.9 % (ref 0.5–1.5)
Methemoglobin: 1.2 % (ref 0.0–1.5)
O2 Saturation: 65.6 %
Total hemoglobin: 11 g/dL — ABNORMAL LOW (ref 12.0–16.0)

## 2021-04-12 LAB — MAGNESIUM: Magnesium: 2.6 mg/dL — ABNORMAL HIGH (ref 1.7–2.4)

## 2021-04-12 LAB — LACTIC ACID, PLASMA: Lactic Acid, Venous: 3.2 mmol/L (ref 0.5–1.9)

## 2021-04-12 LAB — PROTIME-INR
INR: 2.5 — ABNORMAL HIGH (ref 0.8–1.2)
Prothrombin Time: 26.9 seconds — ABNORMAL HIGH (ref 11.4–15.2)

## 2021-04-12 LAB — AMMONIA: Ammonia: 26 umol/L (ref 9–35)

## 2021-04-12 LAB — PROCALCITONIN: Procalcitonin: 3.86 ng/mL

## 2021-04-12 MED ORDER — VASOPRESSIN 20 UNITS/100 ML INFUSION FOR SHOCK
0.0000 [IU]/min | INTRAVENOUS | Status: DC
Start: 2021-04-12 — End: 2021-04-15
  Administered 2021-04-12 – 2021-04-14 (×7): 0.03 [IU]/min via INTRAVENOUS
  Filled 2021-04-12 (×7): qty 100

## 2021-04-12 MED ORDER — SODIUM CHLORIDE 0.9 % IV BOLUS
250.0000 mL | Freq: Once | INTRAVENOUS | Status: AC
  Administered 2021-04-12: 250 mL via INTRAVENOUS

## 2021-04-12 MED ORDER — AMIODARONE IV BOLUS ONLY 150 MG/100ML: 150.0000 mg | Freq: Once | INTRAVENOUS | Status: DC

## 2021-04-12 MED ORDER — AMIODARONE IV BOLUS ONLY 150 MG/100ML
150.0000 mg | Freq: Once | INTRAVENOUS | Status: AC
  Administered 2021-04-12: 150 mg via INTRAVENOUS
  Filled 2021-04-12: qty 100

## 2021-04-12 MED ORDER — AMIODARONE HCL IN DEXTROSE 360-4.14 MG/200ML-% IV SOLN
INTRAVENOUS | Status: AC
  Filled 2021-04-12: qty 200

## 2021-04-12 MED ORDER — WHITE PETROLATUM EX OINT
TOPICAL_OINTMENT | CUTANEOUS | Status: AC
  Administered 2021-04-12: 1
  Filled 2021-04-12: qty 28.35

## 2021-04-12 MED ORDER — INSULIN GLARGINE-YFGN 100 UNIT/ML ~~LOC~~ SOLN
10.0000 [IU] | Freq: Every day | SUBCUTANEOUS | Status: DC
  Administered 2021-04-12 – 2021-04-14 (×3): 10 [IU] via SUBCUTANEOUS
  Filled 2021-04-12 (×4): qty 0.1

## 2021-04-12 NOTE — Progress Notes (Signed)
ANTICOAGULATION CONSULT NOTE  Pharmacy Consult for heparin Indication: chest pain/ACS/stroke   Allergies  Allergen Reactions   Ace Inhibitors Cough    Patient Measurements: Height: '5\' 7"'$  (170.2 cm) Weight: 68.1 kg (150 lb 2.1 oz) IBW/kg (Calculated) : 61.6 Heparin Dosing Weight: 68 kg   Vital Signs: Temp: 97.7 F (36.5 C) (09/03 0344) Temp Source: Axillary (09/03 0344) BP: 132/119 (09/03 1400) Pulse Rate: 93 (09/03 1400)  Labs: Recent Labs    04/10/21 0500 04/10/21 0500 04/10/21 0536 04/10/21 1830 04/10/21 2240 04/11/21 0430 04/11/21 1632 04/12/21 0353 04/12/21 0500 04/12/21 1419  HGB 10.0*  --   --   --   --  10.5*  --  10.6* 12.2  --   HCT 29.9*  --   --   --   --  32.1*  --  32.5* 36.0  --   PLT 246  --   --   --   --  250  --  245  --   --   LABPROT  --    < > 31.5*  --  32.8* 32.0*  --  26.9*  --   --   INR  --    < > 3.1*  --  3.2* 3.1*  --  2.5*  --   --   HEPARINUNFRC 0.11*  --  0.11*  --   --   --   --  <0.10*  --  0.36  CREATININE 4.72*  --   --    < >  --  1.68* 1.49* 1.23*  --   --    < > = values in this interval not displayed.    Estimated Creatinine Clearance: 44.3 mL/min (A) (by C-G formula based on SCr of 1.23 mg/dL (H)).   Assessment: 36 YOF who presents with weakness and decreased appetite. Troponin elevated (peaked at 14,294) concerning for NSTEMI. Of note, patient is on warfarin at home for h/o Afib. 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 INR elevated at 3.1 but thought secondary to elevated liver function (s/p vitamin K on 9/1). Hgb 10.5, plt 250. TEG normal x2 yesterday.  INR trending down (likely not reflective of hemorrhagic risk given liver dysfunction). TEG normal x2. CBC stable. No overt bleeding noted. Discussed with CCM this AM - will increase back to  stroke dose heparin.   PM follow-up: Repeat HL 0.36 on current rate.   Goal of Therapy:  Heparin level 0.3 to 0.5 units/ml Monitor platelets by anticoagulation protocol: Yes   Plan:  -Continue heparin infusion to 1050 units/hr.  -Recheck heparin level in ~6 hours  -Monitor daily HL, CBC, and for s/sx of bleeding   Thank you for involving pharmacy in this patient's care.  Sloan Leiter, PharmD, BCPS, BCCCP Clinical Pharmacist Please refer to [facility] for [place] numbers 04/12/2021 3:19 PM

## 2021-04-12 NOTE — Progress Notes (Signed)
ANTICOAGULATION CONSULT NOTE  Pharmacy Consult for heparin Indication: chest pain/ACS/stroke   Allergies  Allergen Reactions   Ace Inhibitors Cough    Patient Measurements: Height: '5\' 7"'$  (170.2 cm) Weight: 68.1 kg (150 lb 2.1 oz) IBW/kg (Calculated) : 61.6 Heparin Dosing Weight: 68 kg   Vital Signs: Temp: 94.5 F (34.7 C) (09/03 2000) Temp Source: Rectal (09/03 2000) BP: 96/66 (09/03 2230) Pulse Rate: 48 (09/03 2230)  Labs: Recent Labs    04/10/21 0500 04/10/21 0536 04/10/21 2240 04/11/21 0430 04/11/21 1632 04/12/21 0353 04/12/21 0500 04/12/21 1419 04/12/21 1527 04/12/21 2123  HGB 10.0*  --   --  10.5*  --  10.6* 12.2  --   --   --   HCT 29.9*  --   --  32.1*  --  32.5* 36.0  --   --   --   PLT 246  --   --  250  --  245  --   --   --   --   LABPROT  --    < > 32.8* 32.0*  --  26.9*  --   --   --   --   INR  --    < > 3.2* 3.1*  --  2.5*  --   --   --   --   HEPARINUNFRC 0.11*   < >  --   --   --  <0.10*  --  0.36  --  0.50  CREATININE 4.72*   < >  --  1.68* 1.49* 1.23*  --   --  1.02*  --    < > = values in this interval not displayed.     Estimated Creatinine Clearance: 53.5 mL/min (A) (by C-G formula based on SCr of 1.02 mg/dL (H)).   Assessment: 43 YOF who presents with weakness and decreased appetite. Troponin elevated (peaked at 14,294) concerning for NSTEMI. Of note, patient is on warfarin at home for h/o Afib. 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 INR elevated at 3.1 but thought secondary to elevated liver function (s/p vitamin K on 9/1). Hgb 10.5, plt 250. TEG normal x2 yesterday.  INR trending down (likely not reflective of hemorrhagic risk given liver dysfunction). TEG normal x2. CBC stable. No overt bleeding noted. Discussed with CCM this AM - will increase back to  stroke dose heparin.   Heparin drip rate 1050 uts/hr with heparin level 0.5 at top of goal To prevent accumulation decrease drip slightly   Goal of Therapy:  Heparin level 0.3 to 0.5 units/ml Monitor platelets by anticoagulation protocol: Yes   Plan:  Decrease heparin infusion to 1000 units/hr.  -Monitor daily HL, CBC, and for s/sx of bleeding    Bonnita Nasuti Pharm.D. CPP, BCPS Clinical Pharmacist [phone] 04/12/2021 11:07 PM

## 2021-04-12 NOTE — Progress Notes (Signed)
Called to evaluate patient with A. fib with RVR  Due to bradycardia this morning Severe cardiomyopathy Prolonged QT-stable from previous  Requiring pressors Levophed which was being weaned overnight but had to be increased this afternoon, vasopressin was discontinued this morning but has to be reinitiated.  Patient was just restarted on anticoagulation 9/2 for multiple strokes, monitoring closely  A. fib with RVR Embolic strokes Cardiogenic shock Non-ST elevation MI End-stage renal disease on CRRT  Will give 150 mg of amiodarone Continue pressors  Will request for cardiology input

## 2021-04-12 NOTE — Progress Notes (Addendum)
NAME:  Amber Young, MRN:  [id number], DOB:  [birth year], LOS: 5 ADMISSION DATE:  04/05/2021, CONSULTATION DATE:  04/09/21 REFERRING MD: [facility] hospitalist, CHIEF COMPLAINT:  Acute respiratory failure and altered mental status in the setting of benign elevated ST MI with troponins of 14,000 and in a patient with end-stage renal disease.   History of Present Illness:  Amber Young is a 65 year old female her of health issues significant for but not limited to atrial fibrillation combined systolic and diastolic heart failure with last 2D showing EF of 40 to 45% in 2019.  She has also end-stage renal disease and has a tunneled catheter and a new AV graft placed on 8/25 left forearm.  She had the graft placed on 8/25 and return to [facility] on 04/01/2021 with general malaise not feeling well left-sided chest pain troponin greater than 14,000.  She was scheduled for cardiac catheterization on 04/04/2021 but patient was unable to complete at a time due to pain and discomfort and refusing the procedure. 04/09/2021 she has been transferred to the intensive care unit full mechanical ventilatory support anticoagulation is on hold neurology will be consulted and continue current level of intensive care treatment.  Pertinent  Medical History   Past Medical History:  Diagnosis Date   Anemia    Atrial fibrillation ([place]) 06/17/2019   Cataract, bilateral    Chronic combined systolic and diastolic CHF (congestive heart failure) ([place])    a. EF 40-45% in 2019 b. EF 25-30% by repeat echo in 01/2019   CKD (chronic kidney disease), stage IV (HCC)    Coronary artery disease 08/2017   DES x 2 RCA   Dialysis patient ([place]) 07/2019   DKA (diabetic ketoacidoses) 06/16/2019   GERD (gastroesophageal reflux disease)    Glaucoma, left eye    Heart murmur    Hyperlipidemia    Hypertension    Proliferative diabetic retinopathy ([place])    left eye with vitreous hemorrhage and tractional retinal detachment   Stroke  ([place]) 09/2014   numbness left upper lip, finger tips on left hand; "resolved" (05/18/2016)   Type II diabetes mellitus ([place])    Wears glasses      Significant Hospital Events: Including procedures, antibiotic start and stop dates in addition to other pertinent events   8/31 intubated on the floor 8/31 started on CRRT  MRI brain with multiple small infarcts in left ACA, PICA territories suggestive of embolic events. 9/1 Levophed for hypotension 9/2 CTA head, multivessel narrowing, started on low-dose anticoagulation, continuous EEG did not show seizures-discontinued 9/2   Interim History / Subjective:  Stable overnight  Objective   Blood pressure (!) 142/64, pulse (!) 53, temperature 97.7 F (36.5 C), temperature source Axillary, resp. rate 18, height '5\' 7"'$  (1.702 m), weight 68.1 kg, SpO2 100 %.    Vent Mode: PRVC FiO2 (%):  [40 %] 40 % Set Rate:  [18 bmp] 18 bmp Vt Set:  [490 mL] 490 mL PEEP:  [5 cmH20] 5 cmH20 Plateau Pressure:  [15 cmH20-24 cmH20] 15 cmH20   Intake/Output Summary (Last 24 hours) at 04/12/2021 0800 Last data filed at 04/12/2021 0700 Gross per 24 hour  Intake 2119.93 ml  Output 2000 ml  Net 119.93 ml   Filed Weights   04/10/21 0500 04/11/21 0442 04/12/21 0425  Weight: 68.7 kg 68.3 kg 68.1 kg    Examination: General: On no sedation, appears comfortable HENT: Moist oral mucosa, endotracheal tube in place Lungs: Clear breath sounds Cardiovascular: S1-S2 appreciated, Abdomen: Bowel sounds  appreciated Extremities: No clubbing, no edema Neuro: Will open eyes with name calling/interaction, does not follow command, does not track GU: On ultrafiltration   04/09/21-2D echo  1. Left ventricular ejection fraction, by estimation, is 25 to 30%. The  left ventricle has severely decreased function. The left ventricle  demonstrates global hypokinesis. There is moderate left ventricular  hypertrophy. Left ventricular diastolic  function could not be evaluated.  Elevated left ventricular end-diastolic  pressure. There is akinesis of the left ventricular, mid-apical septal  wall.   2. Right ventricular systolic function is mildly reduced. The right  ventricular size is normal.   3. Left atrial size was moderately dilated.   4. Right atrial size was mildly dilated.   5. The mitral valve is abnormal. Mild mitral valve regurgitation. No  evidence of mitral stenosis. Moderate to severe mitral annular  calcification.   6. The aortic valve is grossly normal. There is mild calcification of the  aortic valve. There is mild thickening of the aortic valve. Aortic valve  regurgitation is not visualized. Mild to moderate aortic valve  sclerosis/calcification is present, without   any evidence of aortic stenosis.  9/1 MRI brain >> shows an acute left cerebellar stroke, as well as remote strokes at bilateral cerebellar hemispheres and right thalamus, suggestive of embolic eents affecting the left ACA and left PICA territories 04/10/2021 EEG>> This study is suggestive of moderate diffuse encephalopathy, nonspecific etiology but could be secondary to toxic-metabolic causes. No seizures were seen throughout the recording. 9/2 CT headCTA head:  1. Occlusion of the left A1 ACA and multifocal severe stenosis of the left A2 and A3 ACA. Severe stenosis of the right A3 ACA. 2. Moderate right and severe left paraclinoid ICA stenosis. Rounded filling defect in the left paraclinoid ICA could represent atherosclerotic plaque and/or intraluminal thrombus. 3. Multifocal severe bilateral P2 PCA stenosis. 4. Multifocal severe stenosis of a left M2 MCA branch and multifocal mild to moderate right M2 MCA stenosis. 5. Moderate stenosis of bilateral proximal intradural vertebral arteries.  CTA neck:  1. Approximately 60-70% stenosis of the left common carotid artery. 2. Limited evaluation of the proximal vertebral arteries with likely severe left vertebral artery origin  stenosis. 3. Small and irregular internal carotid arteries with long/smooth segment narrowing and luminal diameter of only a few mm bilaterally at C1-C2.  CT head:  1. Slightly increased edema associated with known acute infarcts in the left cerebellum and high parasagittal left parietal lobe. No progressive mass effect. 2. Moderate chronic microvascular ischemic disease and mild to moderate atrophy.   9/3 chest x-ray-within normal limits, reviewed by myself  ABG with mild respiratory alkalosis  Resolved Hospital Problem list     Assessment & Plan:  Ventilator dependent respiratory failure in the setting of altered mental status, bradycardia, metabolic disarray, ST elevation MI with troponins 14,000 , Questionable seizure activity -Continue full ventilator support -No sedation, has been compliant with vent -Neuro status currently a barrier to weaning  Altered mental status with questionable left arm seizure activity CVA-multiple infarcts, embolic secondary to atrial fibrillation, myocardial infarction Was off anticoagulation for recent surgery -Appreciate neuro follow-up -Anticoagulation for CVA -Continue Keppra  Elevated liver enzymes-improving AST/ALT Elevated ammonia Elevated lactate from poor clearance -Will follow ammonia levels, discontinue lactulose today secondary to significant diarrhea -ammonia level normalized -Continue N-acetylcysteine until 9/4  Non-ST elevation myocardial infarction with troponins in the 14,000 Unable to go to cardiac catheterization Atrial fibrillation History of congestive heart failure Co-oximetry  is 65  from 5 Started on vasopressin in addition to Levophed last evening, being weaned, will stop vasopressin  Diabetes mellitus -Sliding scale insulin -Enteral feed coverage  End-stage renal disease -CRRT -Trend electrolytes and replete as needed  Leukocytosis No fever -Was on piperacillin/vancomycin -Switched to Rocephin 9/2,  significance of 1 in 8 culture with Streptococcus is unclear at present-likely contaminant -Antibiotics being continued empirically because of patient's leukocytosis -Procalcitonin is elevated -Chest x-ray shows no infiltrate  Prognosis is guarded with extensive vascular pathology, multiple strokes, background of end-stage renal disease, new myocardial infarction, atrial fibrillation  Best Practice (right click and "Reselect all SmartList Selections" daily)   Diet/type: tubefeeds DVT prophylaxis: systemic heparin GI prophylaxis: H2B Lines: Central line Foley:  Yes, and it is still needed Code Status:  full code Last date of multidisciplinary goals of care discussion [9/3-updated spouse at bedside ]  Labs   CBC: Recent Labs  Lab 04/05/2021 0454 04/09/21 0248 04/09/21 0930 04/10/21 0500 04/11/21 0430 04/12/21 0353 04/12/21 0500  WBC 8.5 12.2*  --  18.3* 20.0* 26.8*  --   NEUTROABS 6.3  --   --  16.5*  --  24.7*  --   HGB 9.6* 11.2* 10.5* 10.0* 10.5* 10.6* 12.2  HCT 30.0* 34.5* 31.0* 29.9* 32.1* 32.5* 36.0  MCV 84.7 83.5  --  82.4 84.9 84.0  --   PLT 256 297  --  246 250 245  --     Basic Metabolic Panel: Recent Labs  Lab 04/09/21 0248 04/09/21 0930 04/09/21 1450 04/10/21 0500 04/10/21 1830 04/11/21 0430 04/11/21 1632 04/12/21 0353 04/12/21 0500  NA 137   < > 136 137 138 133* 136 137 139  K 5.5*   < > 6.0* 4.0 3.3* 3.8 3.7 3.9 3.9  CL 98  --  95* 100 100 100 103 104  --   CO2 17*  --  17* 21* 22 21* 21* 23  --   GLUCOSE 105*  --  158* 99 228* 223* 223* 233*  --   BUN 34*  --  47* 39* '21 17 17 14  '$ --   CREATININE 5.43*  --  6.22* 4.72* 2.24* 1.68* 1.49* 1.23*  --   CALCIUM 9.0  --  9.2 8.8* 8.4* 8.7* 8.7* 8.8*  --   MG 2.3  --   --  2.6*  --  2.6*  --  2.6*  --   PHOS  --   --  9.4* 5.2*  5.2* 3.1  --  2.2*  --   --    < > = values in this interval not displayed.   GFR: Estimated Creatinine Clearance: 44.3 mL/min (A) (by C-G formula based on SCr of 1.23 mg/dL  (H)). Recent Labs  Lab 03/10/2021 1442 03/18/2021 0454 04/09/21 0248 04/10/21 0500 04/10/21 0755 04/10/21 2255 04/11/21 0430 04/12/21 0350 04/12/21 0353  PROCALCITON  --  4.29  4.30 3.85  --   --   --   --   --  3.86  WBC  --  8.5 12.2* 18.3*  --   --  20.0*  --  26.8*  LATICACIDVEN 2.9*  --   --   --  3.5* 3.7*  --  3.2*  --     Liver Function Tests: Recent Labs  Lab 04/05/2021 0454 04/09/21 0248 04/09/21 1450 04/10/21 0500 04/10/21 1830 04/11/21 0430 04/11/21 1632 04/12/21 0353  AST 1,077* 1,363*  --  9,649*  --  4,440*  --  2,240*  ALT 212* 372*  --  2,575*  --  1,923*  --  1,440*  ALKPHOS 73 115  --  119  --  151*  --  203*  BILITOT 1.3* 1.5*  --  2.2*  --  2.3*  --  2.0*  PROT 6.2* 6.7  --  6.2*  --  6.1*  --  5.9*  ALBUMIN 3.0* 3.2*   < > 3.0*  2.9* 2.4* 2.5* 2.4* 2.4*   < > = values in this interval not displayed.   Recent Labs  Lab 04/04/2021 0806  LIPASE 26   Recent Labs  Lab 03/17/2021 1552 04/10/21 1442 04/12/21 0350  AMMONIA 68* 47* 26    ABG    Component Value Date/Time   PHART 7.450 04/12/2021 0500   PCO2ART 32.5 04/12/2021 0500   PO2ART 109 (H) 04/12/2021 0500   HCO3 22.7 04/12/2021 0500   TCO2 24 04/12/2021 0500   ACIDBASEDEF 1.0 04/12/2021 0500   O2SAT 99.0 04/12/2021 0500     Coagulation Profile: Recent Labs  Lab 04/09/21 1450 04/10/21 0536 04/10/21 2240 04/11/21 0430 04/12/21 0353  INR 2.5* 3.1* 3.2* 3.1* 2.5*    Cardiac Enzymes: No results for input(s): CKTOTAL, CKMB, CKMBINDEX, TROPONINI in the last 168 hours.  HbA1C: Hemoglobin A1C  Date/Time Value Ref Range Status  06/28/2020 10:13 AM 8.5 (A) 4.0 - 5.6 % Final   HbA1c, POC (prediabetic range)  Date/Time Value Ref Range Status  06/28/2020 10:13 AM 8.5 (A) 5.7 - 6.4 % Final   HbA1c, POC (controlled diabetic range)  Date/Time Value Ref Range Status  06/28/2020 10:13 AM 8.5 (A) 0.0 - 7.0 % Final   HbA1c POC (<> result, manual entry)  Date/Time Value Ref Range Status   06/28/2020 10:13 AM 8.5 4.0 - 5.6 % Final   Hgb A1c MFr Bld  Date/Time Value Ref Range Status  03/12/2021 04:54 AM 7.0 (H) 4.8 - 5.6 % Final    Comment:    (NOTE) Pre diabetes:          5.7%-6.4%  Diabetes:              >6.4%  Glycemic control for   <7.0% adults with diabetes   06/29/2019 10:11 PM 8.9 (H) 4.8 - 5.6 % Final    Comment:    (NOTE) Pre diabetes:          5.7%-6.4% Diabetes:              >6.4% Glycemic control for   <7.0% adults with diabetes     CBG: Recent Labs  Lab 04/11/21 1542 04/11/21 1943 04/11/21 2312 04/12/21 0344 04/12/21 0740  GLUCAP 236* 211* 195* 224* 223*    Review of Systems:   Not interactive  Past Medical History:  She,  has a past medical history of Anemia, Atrial fibrillation ([place]) (06/17/2019), Cataract, bilateral, Chronic combined systolic and diastolic CHF (congestive heart failure) ([place]), CKD (chronic kidney disease), stage IV ([place]), Coronary artery disease (08/2017), Dialysis patient ([place]) (07/2019), DKA (diabetic ketoacidoses) (06/16/2019), GERD (gastroesophageal reflux disease), Glaucoma, left eye, Heart murmur, Hyperlipidemia, Hypertension, Proliferative diabetic retinopathy ([place]), Stroke ([place]) (09/2014), Type II diabetes mellitus ([place]), and Wears glasses.   Surgical History:   Past Surgical History:  Procedure Laterality Date   AV FISTULA PLACEMENT Left 07/10/2019   Procedure: ARTERIOVENOUS (AV) FISTULA CREATION;  Surgeon: Angelia Mould, MD;  Location: [facility];  Service: Vascular;  Laterality: Left;   AV FISTULA PLACEMENT Left 04/03/2021   Procedure: INSERTION OF LEFT UPPER ARM ARTERIOVENOUS (AV) GORE-TEX GRAFT;  Surgeon: Serafina Mitchell, MD;  Location: [facility];  Service: Vascular;  Laterality: Left;   CARDIAC CATHETERIZATION N/A 05/18/2016   Procedure: Left Heart Cath and Coronary Angiography;  Surgeon: Jettie Booze, MD;  Location: [facility];  Service: Cardiovascular;  Laterality: N/A;   CARDIAC  CATHETERIZATION N/A 05/18/2016   Procedure: Coronary Stent Intervention;  Surgeon: Jettie Booze, MD;  Location: [place];  Service: Cardiovascular;  Laterality: N/A;   CATARACT EXTRACTION Right 2020   COLONOSCOPY W/ BIOPSIES AND POLYPECTOMY     CORONARY ANGIOPLASTY     CORONARY ANGIOPLASTY WITH STENT PLACEMENT     CORONARY STENT INTERVENTION N/A 09/07/2017   Procedure: CORONARY STENT INTERVENTION;  Surgeon: Leonie Man, MD;  Location: [facility];  Service: Cardiovascular;  Laterality: N/A;   EYE SURGERY     GAS INSERTION Left 01/13/2018   Procedure: INSERTION OF GAS;  Surgeon: Bernarda Caffey, MD;  Location: [facility];  Service: Ophthalmology;  Laterality: Left;   INSERTION OF DIALYSIS CATHETER Right 07/10/2019   Procedure: INSERTION OF TUNNELED DIALYSIS CATHETER;  Surgeon: Angelia Mould, MD;  [facility];  Service: Vascular;  Laterality: Right;   IR FLUORO GUIDE CV LINE RIGHT  07/05/2019   IR US GUIDE VASC ACCESS RIGHT  07/05/2019   LEFT HEART CATH AND CORONARY ANGIOGRAPHY N/A 09/07/2017   Procedure: LEFT HEART CATH AND CORONARY ANGIOGRAPHY;  Surgeon: Leonie Man, MD;  Location: [facility];  Service: Cardiovascular;  Laterality: N/A;   LEFT HEART CATH AND CORONARY ANGIOGRAPHY N/A 03/12/2021   Procedure: LEFT HEART CATH AND CORONARY ANGIOGRAPHY;  Surgeon: Belva Crome, MD;  Location: [place] [facility];  Service: Cardiovascular;  Laterality: N/A;  Canceled   MEMBRANE PEEL Left 01/13/2018   Procedure: MEMBRANE PEEL LEFT EYE ;  Surgeon: Bernarda Caffey, MD;  Location: [place];  Service: Ophthalmology;  Laterality: Left;   MULTIPLE TOOTH EXTRACTIONS     PARS PLANA VITRECTOMY Left 01/13/2018   Procedure: PARS PLANA VITRECTOMY WITH 25 GAUGE LEFT EYE WITH ENDOLASER;  Surgeon: Bernarda Caffey, MD;  Location: [place];  Service: Ophthalmology;  Laterality: Left;   [place]   "partial; fibroids"     Social History:    reports that she has never smoked. She has never used smokeless tobacco. She reports that she does not drink alcohol and does not use drugs.   Family History:  Her family history includes COPD in her mother; Diabetes in her mother; Heart failure in her mother; Hypertension in her brother, mother, and sister.   Allergies Allergies  Allergen Reactions   Ace Inhibitors Cough    Critical care time: 61    The patient is critically ill with multiple organ systems failure and requires high complexity decision making for assessment and support, frequent evaluation and titration of therapies, application of advanced monitoring technologies and extensive interpretation of multiple databases. Critical Care Time devoted to patient care services described in this note independent of APP/resident time (if applicable)  is 40 minutes.   Sherrilyn Rist MD [place] [facility] Personal pager: See Amion If unanswered, please page CCM On-call: [phone]

## 2021-04-12 NOTE — Progress Notes (Addendum)
[facility] Progress Note Patient Name: Amber Young DOB: 1955-08-18 MRN: [id number]   Date of Service  04/12/2021  HPI/Events of Note  Patient now in sinus bradycardia but her blood pressure is soft on 25 mcg of Norepinephrine gtt.  eICU Interventions  Vasopressin gtt ordered targeting MAP of >  65 mmHg.        Amber Young 04/12/2021, 2:56 AM

## 2021-04-12 NOTE — Progress Notes (Addendum)
ANTICOAGULATION CONSULT NOTE  Pharmacy Consult for heparin Indication: chest pain/ACS/stroke   Allergies  Allergen Reactions   Ace Inhibitors Cough    Patient Measurements: Height: '5\' 7"'$  (170.2 cm) Weight: 68.1 kg (150 lb 2.1 oz) IBW/kg (Calculated) : 61.6 Heparin Dosing Weight: 68 kg   Vital Signs: Temp: 97.7 F (36.5 C) (09/03 0344) Temp Source: Axillary (09/03 0344) BP: 142/64 (09/03 0700) Pulse Rate: 59 (09/03 0700)  Labs: Recent Labs    04/10/21 0500 04/10/21 0536 04/10/21 1830 04/10/21 2240 04/11/21 0430 04/11/21 1632 04/12/21 0353 04/12/21 0500  HGB 10.0*  --   --   --  10.5*  --  10.6* 12.2  HCT 29.9*  --   --   --  32.1*  --  32.5* 36.0  PLT 246  --   --   --  250  --  245  --   LABPROT  --  31.5*  --  32.8* 32.0*  --  26.9*  --   INR  --  3.1*  --  3.2* 3.1*  --  2.5*  --   HEPARINUNFRC 0.11* 0.11*  --   --   --   --  <0.10*  --   CREATININE 4.72*  --    < >  --  1.68* 1.49* 1.23*  --    < > = values in this interval not displayed.    Estimated Creatinine Clearance: 44.3 mL/min (A) (by C-G formula based on SCr of 1.23 mg/dL (H)).   Assessment: 29 YOF who presents with weakness and decreased appetite. Troponin elevated (peaked at 14,294) concerning for NSTEMI. Of note, patient is on warfarin at home for h/o Afib. 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 INR elevated at 3.1 but thought secondary to elevated liver function (s/p vitamin K on 9/1). Hgb 10.5, plt 250. TEG normal x2 yesterday.  INR trending down (likely not reflective of hemorrhagic risk given liver dysfunction). TEG normal x2. CBC stable. No overt bleeding noted. Discussed with CCM this AM - will increase back to stroke dose heparin.   Goal of Therapy:  Heparin level 0.3 to 0.5 units/ml Monitor platelets  by anticoagulation protocol: Yes   Plan:  -Increase heparin infusion to 1050 units/hr.  -Will order heparin level in ~6-8 hours  -Monitor daily HL, CBC, and for s/sx of bleeding   Thank you for involving pharmacy in this patient's care.  Sloan Leiter, PharmD, BCPS, BCCCP Clinical Pharmacist Please refer to [facility] for [place] numbers 04/12/2021 7:31 AM

## 2021-04-12 NOTE — Progress Notes (Signed)
[facility] ROUNDING NOTE   Subjective:   Interval History: This is a 65 year old lady end-stage renal disease secondary to diabetes.  She has a history of atrial fibrillation and congestive heart failure with systolic dysfunction ejection fraction 25 to 30%.  She has a history of coronary artery disease and status post PTCA.  She is now admitted with an NSTEMI.  Her usual dialysis days are Monday Wednesday Friday.     She has a postop hematoma from recent left AV graft placement.  04/03/2021.  Patient underwent dialysis 03/16/2021.  He did not receive a cardiac catheterization due to pain and discomfort refusing procedure.  She developed acute dyspnea 04/09/2021 and was transferred to the intensive care unit for mechanical ventilation.  Appreciate assistance from critical care.  She started CRRT 04/09/2021. 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  CT angio head and neck notable for multifocal multivessel extra and intracranial stenosis.  There is an additional round filling defect in the left paraclinoid ICA.  She continues on IV anticoagulation  Blood pressure 107/79 pulse 84 temperature 97.7 O2 sats 100% FiO2 40%   Sodium 133 potassium 3.8 chloride 100 CO2 21 BUN 17 creatinine 1.68 glucose 223 calcium 8.7 phosphorus 3.1   IV norepinephrine IV vasopressin IV heparin      Objective:  Vital signs in last 24 hours:  Temp:  [94.9 F (34.9 C)-97.8 F (36.6 C)] 97.7 F (36.5 C) (09/03 0344) Pulse Rate:  [48-173] 54 (09/03 0800) Resp:  [14-30] 19 (09/03 0800) BP: (39-199)/(22-104) 151/52 (09/03 0800) SpO2:  [50 %-100 %] 100 % (09/03 0800) FiO2 (%):  [40 %] 40 % (09/03 0800) Weight:  [68.1 kg] 68.1 kg (09/03 0425)  Weight change: -0.2 kg Filed Weights   04/10/21 0500 04/11/21 0442 04/12/21 0425  Weight: 68.7 kg 68.3 kg 68.1 kg    Intake/Output: I/O last 3 completed shifts: In: 3481.3 [I.V.:1064.6; NG/GT:1769.2; IV Piggyback:647.5] Out: 3928 [Emesis/NG  output:60; Other:3868]   Intake/Output this shift:  Total I/O In: 106.6 [I.V.:56.6; NG/GT:50] Out: 40 [Other:40]  Patient intubated CVS- RRR mechanically supported breath sounds RS- CTA ABD- BS present hypoactive bowel sounds EXT- no edema     Basic Metabolic Panel: Recent Labs  Lab 04/09/21 0248 04/09/21 0930 04/09/21 1450 04/10/21 0500 04/10/21 1830 04/11/21 0430 04/11/21 1632 04/12/21 0353 04/12/21 0500  NA 137   < > 136 137 138 133* 136 137 139  K 5.5*   < > 6.0* 4.0 3.3* 3.8 3.7 3.9 3.9  CL 98  --  95* 100 100 100 103 104  --   CO2 17*  --  17* 21* 22 21* 21* 23  --   GLUCOSE 105*  --  158* 99 228* 223* 223* 233*  --   BUN 34*  --  47* 39* _0 --   CREATININE 5.43*  --  6.22* 4.72* 2.24* 1.68* 1.49* 1.23*  --   CALCIUM 9.0  --  9.2 8.8* 8.4* 8.7* 8.7* 8.8*  --   MG 2.3  --   --  2.6*  --  2.6*  --  2.6*  --   PHOS  --   --  9.4* 5.2*  5.2* 3.1  --  2.2*  --   --    < > = values in this interval not displayed.     Liver Function Tests: Recent Labs  Lab 03/21/2021 0454 04/09/21 0248 04/09/21 1450 04/10/21 0500 04/10/21 1830 04/11/21 0430 04/11/21  1632 04/12/21 0353  AST 1,077* 1,363*  --  9,649*  --  4,440*  --  2,240*  ALT 212* 372*  --  2,575*  --  1,923*  --  1,440*  ALKPHOS 73 115  --  119  --  151*  --  203*  BILITOT 1.3* 1.5*  --  2.2*  --  2.3*  --  2.0*  PROT 6.2* 6.7  --  6.2*  --  6.1*  --  5.9*  ALBUMIN 3.0* 3.2*   < > 3.0*  2.9* 2.4* 2.5* 2.4* 2.4*   < > = values in this interval not displayed.    Recent Labs  Lab 03/27/2021 0806  LIPASE 26    Recent Labs  Lab 04/05/2021 1552 04/10/21 1442 04/12/21 0350  AMMONIA 68* 47* 26     CBC: Recent Labs  Lab 03/11/2021 0454 04/09/21 0248 04/09/21 0930 04/10/21 0500 04/11/21 0430 04/12/21 0353 04/12/21 0500  WBC 8.5 12.2*  --  18.3* 20.0* 26.8*  --   NEUTROABS 6.3  --   --  16.5*  --  24.7*  --   HGB 9.6* 11.2* 10.5* 10.0* 10.5* 10.6* 12.2  HCT 30.0* 34.5* 31.0* 29.9*  32.1* 32.5* 36.0  MCV 84.7 83.5  --  82.4 84.9 84.0  --   PLT 256 297  --  246 250 245  --      Cardiac Enzymes: No results for input(s): CKTOTAL, CKMB, CKMBINDEX, TROPONINI in the last 168 hours.  BNP: Invalid input(s): POCBNP  CBG: Recent Labs  Lab 04/11/21 1542 04/11/21 1943 04/11/21 2312 04/12/21 0344 04/12/21 0740  GLUCAP 236* 211* 195* 224* 54*     Microbiology: Results for orders placed or performed during the hospital encounter of 03/18/2021  Culture, blood (routine x 2)     Status: None   Collection Time: 04/06/2021  9:22 AM   Specimen: BLOOD RIGHT FOREARM  Result Value Ref Range Status   Specimen Description BLOOD RIGHT FOREARM  Final   Special Requests   Final    BOTTLES DRAWN AEROBIC AND ANAEROBIC Blood Culture results may not be optimal due to an inadequate volume of blood received in culture bottles   Culture   Final    NO GROWTH 5 DAYS Performed at [facility], [place] [address], [place], [place] [address]    Report Status 04/12/2021 FINAL  Final  Culture, blood (routine x 2)     Status: None   Collection Time: 03/23/2021  9:30 AM   Specimen: BLOOD RIGHT ARM  Result Value Ref Range Status   Specimen Description BLOOD RIGHT ARM  Final   Special Requests   Final    BOTTLES DRAWN AEROBIC AND ANAEROBIC Blood Culture adequate volume   Culture   Final    NO GROWTH 5 DAYS Performed at [facility], [place] [address] [place], [place] [address]    Report Status 04/12/2021 FINAL  Final  Resp Panel by RT-PCR (Flu A&B, Covid) Nasopharyngeal Swab     Status: None   Collection Time: 03/20/2021 10:04 AM   Specimen: Nasopharyngeal Swab; Nasopharyngeal(NP) swabs in vial transport medium  Result Value Ref Range Status   SARS Coronavirus 2 by RT PCR NEGATIVE NEGATIVE Final    Comment: (NOTE) SARS-CoV-2 target nucleic acids are NOT DETECTED.  The SARS-CoV-2 RNA is generally detectable in upper respiratory specimens during the acute phase of infection. The  lowest concentration of SARS-CoV-2 viral copies this assay can detect is 138 copies/mL.  A negative result does not preclude SARS-Cov-2 infection and should not be used as the sole basis for treatment or other patient management decisions. A negative result may occur with  improper specimen collection/handling, submission of specimen other than nasopharyngeal swab, presence of viral mutation(s) within the areas targeted by this assay, and inadequate number of viral copies(<138 copies/mL). A negative result must be combined with clinical observations, patient history, and epidemiological information. The expected result is Negative.  Fact Sheet for Patients:  EntrepreneurPulse.com.au  Fact Sheet for Healthcare Providers:  IncredibleEmployment.be  This test is no t yet approved or cleared by the [place] FDA and  has been authorized for detection and/or diagnosis of SARS-CoV-2 by FDA under an Emergency Use Authorization (EUA). This EUA will remain  in effect (meaning this test can be used) for the duration of the COVID-19 declaration under Section 564(b)(1) of the Act, 21 U.S.C.section 360bbb-3(b)(1), unless the authorization is terminated  or revoked sooner.       Influenza A by PCR NEGATIVE NEGATIVE Final   Influenza B by PCR NEGATIVE NEGATIVE Final    Comment: (NOTE) The Xpert Xpress SARS-CoV-2/FLU/RSV plus assay is intended as an aid in the diagnosis of influenza from Nasopharyngeal swab specimens and should not be used as a sole basis for treatment. Nasal washings and aspirates are unacceptable for Xpert Xpress SARS-CoV-2/FLU/RSV testing.  Fact Sheet for Patients: EntrepreneurPulse.com.au  Fact Sheet for Healthcare Providers: IncredibleEmployment.be  This test is not yet approved or cleared by the [place] FDA and has been authorized for detection and/or diagnosis of SARS-CoV-2 by FDA under  an Emergency Use Authorization (EUA). This EUA will remain in effect (meaning this test can be used) for the duration of the COVID-19 declaration under Section 564(b)(1) of the Act, 21 U.S.C. section 360bbb-3(b)(1), unless the authorization is terminated or revoked.  Performed at [facility], [place] [address], [place], [place] [address]   MRSA Next Gen by PCR, Nasal     Status: None   Collection Time: 04/09/21  3:25 PM   Specimen: Nasal Mucosa; Nasal Swab  Result Value Ref Range Status   MRSA by PCR Next Gen NOT DETECTED NOT DETECTED Final    Comment: (NOTE) The GeneXpert MRSA Assay (FDA approved for NASAL specimens only), is one component of a comprehensive MRSA colonization surveillance program. It is not intended to diagnose MRSA infection nor to guide or monitor treatment for MRSA infections. Test performance is not FDA approved in patients less than 62 years old. Performed at [facility], [place] [address], [place], [place] [address]   Culture, blood (Routine X 2) w Reflex to ID Panel     Status: None (Preliminary result)   Collection Time: 04/10/21  2:45 PM   Specimen: BLOOD  Result Value Ref Range Status   Specimen Description BLOOD SITE NOT SPECIFIED  Final   Special Requests   Final    BOTTLES DRAWN AEROBIC AND ANAEROBIC Blood Culture adequate volume   Culture  Setup Time   Final    GRAM POSITIVE COCCI IN CHAINS AEROBIC BOTTLE ONLY CRITICAL RESULT CALLED TO, READ BACK BY AND VERIFIED WITH: PHARMD M.JAMES AT 3220 ON 04/11/2021 BY T.SAAD. Performed at [facility], [place] [address], [place], [place] [address]    Culture [facility] POSITIVE COCCI  Final   Report Status PENDING  Incomplete  Blood Culture ID Panel (Reflexed)     Status: Abnormal   Collection Time: 04/10/21  2:45 PM  Result Value Ref Range Status   Enterococcus faecalis NOT DETECTED NOT DETECTED Final   Enterococcus Faecium NOT DETECTED NOT DETECTED Final   Listeria monocytogenes NOT DETECTED NOT  DETECTED Final   Staphylococcus species NOT DETECTED NOT DETECTED Final   Staphylococcus aureus (BCID) NOT DETECTED NOT DETECTED Final   Staphylococcus epidermidis NOT DETECTED NOT DETECTED Final   Staphylococcus lugdunensis NOT DETECTED NOT DETECTED Final   Streptococcus species DETECTED (A) NOT DETECTED Final    Comment: Not Enterococcus species, Streptococcus agalactiae, Streptococcus pyogenes, or Streptococcus pneumoniae. CRITICAL RESULT CALLED TO, READ BACK BY AND VERIFIED WITH: PHARMD M.JAMES AT 1454 ON 04/11/2021 BY T.SAAD.    Streptococcus agalactiae NOT DETECTED NOT DETECTED Final   Streptococcus pneumoniae NOT DETECTED NOT DETECTED Final   Streptococcus pyogenes NOT DETECTED NOT DETECTED Final   A.calcoaceticus-baumannii NOT DETECTED NOT DETECTED Final   Bacteroides fragilis NOT DETECTED NOT DETECTED Final   Enterobacterales NOT DETECTED NOT DETECTED Final   Enterobacter cloacae complex NOT DETECTED NOT DETECTED Final   Escherichia coli NOT DETECTED NOT DETECTED Final   Klebsiella aerogenes NOT DETECTED NOT DETECTED Final   Klebsiella oxytoca NOT DETECTED NOT DETECTED Final   Klebsiella pneumoniae NOT DETECTED NOT DETECTED Final   Proteus species NOT DETECTED NOT DETECTED Final   Salmonella species NOT DETECTED NOT DETECTED Final   Serratia marcescens NOT DETECTED NOT DETECTED Final   Haemophilus influenzae NOT DETECTED NOT DETECTED Final   Neisseria meningitidis NOT DETECTED NOT DETECTED Final   Pseudomonas aeruginosa NOT DETECTED NOT DETECTED Final   Stenotrophomonas maltophilia NOT DETECTED NOT DETECTED Final   Candida albicans NOT DETECTED NOT DETECTED Final   Candida auris NOT DETECTED NOT DETECTED Final   Candida glabrata NOT DETECTED NOT DETECTED Final   Candida krusei NOT DETECTED NOT DETECTED Final   Candida parapsilosis NOT DETECTED NOT DETECTED Final   Candida tropicalis NOT DETECTED NOT DETECTED Final   Cryptococcus neoformans/gattii NOT DETECTED NOT DETECTED  Final    Comment: Performed at [facility], [address], [place], [place] [address]  Culture, blood (Routine X 2) w Reflex to ID Panel     Status: None (Preliminary result)   Collection Time: 04/10/21  4:35 PM   Specimen: BLOOD RIGHT WRIST  Result Value Ref Range Status   Specimen Description BLOOD RIGHT WRIST  Final   Special Requests   Final    BOTTLES DRAWN AEROBIC AND ANAEROBIC Blood Culture adequate volume   Culture   Final    NO GROWTH 2 DAYS Performed at [facility], [place] [address], [place], [place] [address]    Report Status PENDING  Incomplete    Coagulation Studies: Recent Labs    04/09/21 1450 04/10/21 0536 04/10/21 2240 04/11/21 0430 04/12/21 0353  LABPROT 27.0* 31.5* 32.8* 32.0* 26.9*  INR 2.5* 3.1* 3.2* 3.1* 2.5*     Urinalysis: No results for input(s): COLORURINE, LABSPEC, PHURINE, GLUCOSEU, HGBUR, BILIRUBINUR, KETONESUR, PROTEINUR, UROBILINOGEN, NITRITE, LEUKOCYTESUR in the last 72 hours.  Invalid input(s): APPERANCEUR    Imaging: CT ANGIO HEAD NECK W WO CM  Result Date: 04/11/2021 CLINICAL DATA:  Stroke suspected (Ped 0-18y) EXAM: CT ANGIOGRAPHY HEAD AND NECK TECHNIQUE: Multidetector CT imaging of the head and neck was performed using the standard protocol during bolus administration of intravenous contrast. Multiplanar CT image reconstructions and MIPs were obtained to evaluate the vascular anatomy. Carotid stenosis measurements (when applicable) are obtained utilizing NASCET criteria, using the distal internal carotid diameter as the denominator. CONTRAST:  50m OMNIPAQUE IOHEXOL 350 MG/ML SOLN COMPARISON:  CT Head and MRI 04/09/2021 FINDINGS: CT HEAD FINDINGS Brain: Slightly increased edema associated with known acute infarcts in the left cerebellum and high parasagittal left parietal lobe. No progressive mass effect. No acute hemorrhage. No evidence of new/interval acute large vascular territory infarct. Remote infarcts versus dilated  perivascular spaces in bilateral inferior basal ganglia, similar. Additional patchy white matter hypoattenuation, nonspecific but compatible with chronic microvascular ischemic disease. Mild to moderate generalized atrophy with ex vacuo ventricular dilation. No hydrocephalus. No mass lesion. No extra-axial fluid collection. Vascular: See below Skull: No acute fracture. Sinuses: Clear sinuses. Orbits: No acute finding. Review of the MIP images confirms the above findings CTA NECK FINDINGS Aortic arch: Atherosclerosis.  Great vessel origins are patent. Right carotid system: Patent common carotid artery without significant stenosis. Atherosclerosis at the carotid bifurcation without greater than 50% stenosis. Long segment narrowing of bilateral mid to distal internal carotid artery with luminal diameter of only 1-2 mm at the C1-C2 level. Left carotid system: Mixed calcific and noncalcific atherosclerosis of the common carotid artery with r up to 60-70% stenosis. Atherosclerosis at the carotid bifurcation without greater than 50% stenosis. Long segment narrowing of bilateral mid to distal internal carotid artery with luminal diameter of only 1-2 mm at the C1-C2 level. Vertebral arteries: Limited evaluation of the proximal vertebral arteries due to streak artifact from dense contrast in veins. Likely severe stenosis of the left vertebral artery origin. Both vertebral arteries are patent more distally. Skeleton: Other neck: Upper chest: Biapical pleuroparenchymal scarring. Otherwise, lung apices are clear. Review of the MIP images confirms the above findings CTA HEAD FINDINGS Anterior circulation: Moderate atherosclerosis of bilateral internal carotid arteries. Moderate narrowing of the right paraclinoid ICA. Severe narrowing of the left paraclinoid ICA. Rounded filling defect within the left paraclinoid ICA, which could represent atherosclerotic plaque or intraluminal thrombus. Bilateral MCAs are patent. Mild stenosis of  the right M1 MCA. Multifocal severe stenosis of a left M2 MCA branch. Multifocal moderate stenosis of right M2 MCA branches. Occlusion of the left A1 ACA and multifocal severe stenosis of the left A2 ACA. Severe stenosis of the right A3 ACA. Posterior circulation: Moderate stenosis of bilateral proximal intradural vertebral arteries. Patent basilar artery. Patent bilateral posterior cerebral arteries. Multifocal severe P2 PCA stenosis bilaterally. Venous sinuses: Poorly evaluated due to arterial timing. Review of the MIP images confirms the above findings IMPRESSION: CTA head: 1. Occlusion of the left A1 ACA and multifocal severe stenosis of the left A2 and A3 ACA. Severe stenosis of the right A3 ACA. 2. Moderate right and severe left paraclinoid ICA stenosis. Rounded filling defect in the left paraclinoid ICA could represent atherosclerotic plaque and/or intraluminal thrombus. 3. Multifocal severe bilateral P2 PCA stenosis. 4. Multifocal severe stenosis of a left M2 MCA branch and multifocal mild to moderate right M2 MCA stenosis. 5. Moderate stenosis of bilateral proximal intradural vertebral arteries. CTA neck: 1. Approximately 60-70% stenosis of the left common carotid artery. 2. Limited evaluation of the proximal vertebral arteries with likely severe left vertebral artery origin stenosis. 3. Small and irregular internal carotid arteries with long/smooth segment narrowing and luminal diameter of only a few mm bilaterally at C1-C2. CT head: 1. Slightly increased edema associated with known acute infarcts in the left cerebellum and high parasagittal left parietal lobe. No progressive mass effect. 2. Moderate chronic microvascular ischemic disease and mild to moderate atrophy. Findings discussed with Dr. OLucile Shuttersvia telephone at 7:24 p.m. Electronically Signed  By: Margaretha Sheffield M.D.   On: 04/11/2021 19:31   DG CHEST PORT 1 VIEW  Result Date: 04/12/2021 CLINICAL DATA:  Respiratory failure EXAM: PORTABLE CHEST  1 VIEW COMPARISON:  04/10/2021 chest radiograph. FINDINGS: Endotracheal tube tip is 5.0 cm above the carina. Enteric tube enters stomach with the tip not seen on this image. Stable right internal jugular and left internal jugular central venous catheters. Stable cardiomediastinal silhouette with mild cardiomegaly. No pneumothorax. No pleural effusion. Lungs appear clear, with no acute consolidative airspace disease and no pulmonary edema. Stable left axillary vascular stent. IMPRESSION: Well-positioned support structures. Stable mild cardiomegaly without pulmonary edema. No active pulmonary disease. Electronically Signed   By: Ilona Sorrel M.D.   On: 04/12/2021 06:29   DG CHEST PORT 1 VIEW  Result Date: 04/10/2021 CLINICAL DATA:  65 year old female status post central line placement. EXAM: PORTABLE CHEST - 1 VIEW COMPARISON:  Earlier the same day FINDINGS: The mediastinal contours are within normal limits. No cardiomegaly. Interval insertion of right internal jugular central venous catheter with the catheter tip in the superior vena cava. Unchanged position of the indwelling left internal jugular tunneled hemodialysis catheter with the tip in the right atrium, endotracheal tube with the tip in the midthoracic trachea, and enteric feeding tube courses off the inferior aspect of this image. The lungs are clear bilaterally without evidence of focal consolidation, pleural effusion, or pneumothorax. No acute osseous abnormality. IMPRESSION: No pneumothorax after placement of right internal jugular central line which appears appropriately positioned. Ruthann Cancer, MD [facility] Electronically Signed   By: Ruthann Cancer M.D.   On: 04/10/2021 14:59   Overnight EEG with video  Result Date: 04/10/2021 Lora Havens, MD     04/11/2021  8:41 AM Patient Name: Gilberta Peeters MRN: [id number] Epilepsy Attending: Lora Havens Referring Physician/Provider:  Payton Doughty, NP Duration: 04/09/2021 1925 to 04/10/2021 1925  Patient history: 65yo F with altered mental status with questionable left arm seizure activity. EEG to evaluate for seizure  Level of alertness: Awake, asleep  AEDs during EEG study: None  Technical aspects: This EEG study was done with scalp electrodes positioned according to the 10-20 International system of electrode placement. Electrical activity was acquired at a sampling rate of _0  and reviewed with a high frequency filter of _1  and a low frequency filter of _2 . EEG data were recorded continuously and digitally stored.  Description: EEG showed continuous generalized 3 to 6 Hz theta-delta slowing.  Generalized periodic discharges with triphasic morphology at 1 to 1.5 Hz were also noted, predominantly when patient was awake/stimulated.  Hyperventilation and photic stimulation were not performed.    ABNORMALITY - Continuous slow, generalized - Periodic discharges with triphasic morphology, generalized  IMPRESSION: This study showed generalized periodic discharges with triphasic morphology which is on the ictal-interictal continuum.  However, the morphology, frequency and reactivity to stimulation is more commonly seen due to toxic-metabolic causes.  There is also moderate diffuse encephalopathy, nonspecific etiology. No seizures were seen throughout the recording.  Priyanka [name]     Medications:     prismasol BGK 4/2.5 500 mL/hr at 04/12/21 0720    prismasol BGK 4/2.5 500 mL/hr at 04/12/21 0725   acetylcysteine 6.25 mg/kg/hr (04/12/21 0800)   cefTRIAXone (ROCEPHIN)  IV Stopped (04/11/21 1815)   feeding supplement (VITAL 1.5 CAL) 50 mL/hr at 04/11/21 2100   heparin 1,050 Units/hr (04/12/21 0800)   levETIRAcetam 750 mg (04/11/21 2328)   norepinephrine (LEVOPHED) Adult  infusion 4 mcg/min (04/12/21 0800)   prismasol BGK 4/2.5 2,000 mL/hr at 04/12/21 0656   vasopressin 0.03 Units/min (04/12/21 0800)    B-complex with vitamin C  1  tablet Per Tube Daily   chlorhexidine gluconate (MEDLINE KIT)  15 mL Mouth Rinse BID   Chlorhexidine Gluconate Cloth  6 each Topical Daily   docusate  100 mg Per Tube BID   famotidine  10 mg Per Tube Daily   feeding supplement (PROSource TF)  45 mL Per Tube TID   insulin aspart  0-15 Units Subcutaneous Q4H   insulin glargine-yfgn  10 Units Subcutaneous Daily   mouth rinse  15 mL Mouth Rinse 10 times per day   polyethylene glycol  17 g Per Tube Daily   sodium chloride flush  10-40 mL Intracatheter Q12H   sodium chloride flush  3 mL Intravenous Q12H   alteplase, fentaNYL (SUBLIMAZE) injection, fentaNYL (SUBLIMAZE) injection, heparin, midazolam, midazolam, sodium chloride, sodium chloride flush  Assessment/ Plan:  Dialysis Orders: [facility] MWF 4HR 15MIN, 3K, 2.ca , EDW 70.0 kg just lowered half kilogram  Right IJ PC ,LUA AVGG inserted 7/22/220 Mircera 60 MCG on 03/19/21 2000 units heparin heparin Calcitriol 1.75 MCG   Assessment/Plan Non-STEMI= plan per admit/cardiology consulted.  She was scheduled for cardiac catheterization but was having chest pain and refused. 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  CT angio shows multiple areas of plaque.  Continues on IV anticoagulation ESRD -HD MWF patient underwent dialysis 03/19/2021.  Patient initiated CRRT 04/09/2021 Hypertension/volume  -continue CRRT Mild hyperkalemia= improved with CRRT History of ischemic cardiomyopathy EF 35% in 2020 Anemia of ESRD-hemoglobin stable at this point Metabolic bone disease -try on hemodialysis binders when taking p.o.'s with meals Type 2 diabetes mellitus= per admit team Nutrition -when eating carb modified/renal with fluid restriction      LOS: [place] _0 _1 :50 AM

## 2021-04-12 NOTE — Progress Notes (Addendum)
STROKE TEAM PROGRESS NOTE   INTERVAL HISTORY Her daughter is at the bedside. Pt still intubated, minimally responsive. On 2 pressors BP stable. Overnight CTA head and neck showed possible left ICA siphon thrombus vs. Soft plaque. Continued on heparin IV and keppra. Encourage family to discuss about [place].    Vitals:   04/12/21 1200 04/12/21 1300 04/12/21 1400 04/12/21 1435  BP: 93/69 104/63 (!) 132/119   Pulse: (!) 154 (!) 59 93   Resp: '17 18 18   '$ Temp:      TempSrc:      SpO2: 100% 100% 100% 100%  Weight:      Height:       CBC:  Recent Labs  Lab 04/10/21 0500 04/11/21 0430 04/12/21 0353 04/12/21 0500  WBC 18.3* 20.0* 26.8*  --   NEUTROABS 16.5*  --  24.7*  --   HGB 10.0* 10.5* 10.6* 12.2  HCT 29.9* 32.1* 32.5* 36.0  MCV 82.4 84.9 84.0  --   PLT 246 250 245  --    Basic Metabolic Panel:  Recent Labs  Lab 04/10/21 1830 04/11/21 0430 04/11/21 1632 04/12/21 0353 04/12/21 0500  NA 138 133* 136 137 139  K 3.3* 3.8 3.7 3.9 3.9  CL 100 100 103 104  --   CO2 22 21* 21* 23  --   GLUCOSE 228* 223* 223* 233*  --   BUN '21 17 17 14  '$ --   CREATININE 2.24* 1.68* 1.49* 1.23*  --   CALCIUM 8.4* 8.7* 8.7* 8.8*  --   MG  --  2.6*  --  2.6*  --   PHOS 3.1  --  2.2*  --   --     Lipid Panel:  Recent Labs  Lab 03/12/2021 0454  CHOL 165  TRIG 62  HDL 63  CHOLHDL 2.6  VLDL 12  LDLCALC 90    HgbA1c:  Recent Labs  Lab 03/17/2021 0454  HGBA1C 7.0*   Urine Drug Screen: No results for input(s): LABOPIA, COCAINSCRNUR, LABBENZ, AMPHETMU, THCU, LABBARB in the last 168 hours.  Alcohol Level No results for input(s): ETH in the last 168 hours.  IMAGING past 24 hours DG CHEST PORT 1 VIEW  Result Date: 04/12/2021 CLINICAL DATA:  Respiratory failure EXAM: PORTABLE CHEST 1 VIEW COMPARISON:  04/10/2021 chest radiograph. FINDINGS: Endotracheal tube tip is 5.0 cm above the carina. Enteric tube enters stomach with the tip not seen on this image. Stable right internal jugular and left internal  jugular central venous catheters. Stable cardiomediastinal silhouette with mild cardiomegaly. No pneumothorax. No pleural effusion. Lungs appear clear, with no acute consolidative airspace disease and no pulmonary edema. Stable left axillary vascular stent. IMPRESSION: Well-positioned support structures. Stable mild cardiomegaly without pulmonary edema. No active pulmonary disease. Electronically Signed   By: Ilona Sorrel M.D.   On: 04/12/2021 06:29    PHYSICAL EXAM  Temp:  [97.2 F (36.2 C)-97.8 F (36.6 C)] 97.7 F (36.5 C) (09/03 0344) Pulse Rate:  [53-173] 93 (09/03 1400) Resp:  [14-30] 18 (09/03 1400) BP: (39-199)/(22-119) 132/119 (09/03 1400) SpO2:  [50 %-100 %] 100 % (09/03 1435) FiO2 (%):  [40 %] 40 % (09/03 1435) Weight:  [68.1 kg] 68.1 kg (09/03 0425)  General - Well nourished, well developed, intubated off sedation.  Ophthalmologic - fundi not visualized due to noncooperation.  Cardiovascular - irregularly irregular heart rate and rhythm.  Neuro - intubated off sedation, eyes closed but open with pain stimulation, not following commands. With forced eye  opening, eyes in mid position, not blinking to visual threat, doll's eyes present but sluggish, not tracking, pupil bilaterally pinpoint, no obvious pupillary reflexes. Corneal reflex absent bilaterally, gag and cough weakly present. Breathing over the vent.  Facial symmetry not able to test due to ET tube.  Tongue protrusion not cooperative. On pain stimulation, no movement of extremities except RUE flicker withdraw. DTR diminished and no babinski. Sensation, coordination and gait not tested.    ASSESSMENT/PLAN [name] is a 65 y.o. female with history of atrial fibrillation on coumadin, chronic combined systolic and diastolic CHF with EF of 123XX123, ESRD on HD M,W,F, CAD s/p PCI to LAD in 2017, RCA 2019, hyperlipidemia, essential hypertension, stroke without residual deficits, type 2 diabetes mellitus, and a recent AV  graft placement of the left arm on 8/25, who presented to the ED 03/25/2021 for evaluation of 2 days of malaise, weakness, fatigue, and decreased appetite and was found to have an NSTEMI.   She was to have a cardiac cath and anticoagulation was discontinued and she was placed on heparin drip. The cath did not happen because developed significantly altered mentation. She was seen staring into space and had left arm twitching concerning for seizure activity.   Stroke:  left ACA scattered and left cerebellum infarcts, embolic pattern, secondary to AF with subtherapeutic INR/on and off AC vs. NSTEMI vs. cardiomyopathy CT head: Small infarcts within the bilateral cerebellar hemispheres are new as compared to the brain MRI of 06/17/2019, but otherwise age-indeterminate. Re-demonstrated chronic lacunar infarcts within bilateral basal ganglia. Re-demonstrated chronic lacunar infarct within the right thalamus.  MRI : Multiple small acute/subacute infarcts in the left ACA and left PICA territories, suggesting embolic events.  CTA Head - Occlusion of the left A1 ACA and multifocal severe stenosis of the left A2 and A3 ACA. Severe stenosis of the right A3 ACA. Moderate right and severe left paraclinoid ICA stenosis.  Rounded filling defect in the left paraclinoid ICA could represent atherosclerotic plaque and/or intraluminal thrombus. Multifocal severe bilateral P2 PCA stenosis. Multifocal severe stenosis of a left M2 MCA branch and multifocal mild to moderate right M2 MCA stenosis. Moderate stenosis of bilateral proximal intradural vertebral arteries.  CTA Neck - Approximately 60-70% stenosis of the left common carotid artery.  2D Echo - LVEF 25-30%, LA dilated, lipotamous interatrial septum.  LDL 90 HgbA1c 7.0 VTE prophylaxis - Heparin IV warfarin and ASA 81 daily prior to admission, now on heparin IV.   Therapy recommendations: pending Disposition: Poor prognosis, encouraged family for [place] discussion  Chronic  A. Fib On Coumadin prior to admission Initial INR 1.2, subtherapeutic On and off Coumadin for surgery/procedure Now on heparin IV Cardiology on board  Seizure staring into space and had left arm twitching EEG periodic discharges on the ictal -interictal continuum, as well as moderate diffuse encephalopathy, but no seizures seen.  loaded with keppra 1.5gm and started on keppra '750mg'$  BID on 9/1.  NSTEMI Cardiomyopathy CHF 2D echo showed EF 25 to 30% Cardiology on board On heparin IV  Acute liver failure Significantly elevated AST/ALT INR 1.2->...->2.5 - most likely falsely elevated d/t liver disease. Monitor AST/ALT  Leukocytosis WBC 20.0-> 26.8 1/2 positive blood culture On Rocephin CCM on board  Respiratory failure Intubated on ventilation Not a candidate for extubation due to mental status CCM on board Encourage family to discuss about [place]  History of hypertension Hypotension Home meds:  amlodipine, coreg, hydralazine, Imdur,  Unstable On Levophed and vasopressin Long-term BP goal normotensive  Hyperlipidemia Home meds:  lipitor 40 LDL 90, goal < 70 Statin not resumed in hospital d/t liver failure May consider statin once liver function normalizes  Diabetes type II Controlled Home meds:  glucotrol, insulin HgbA1c 7.0, goal < 7.0 Hyperglycemia CBGs SSI On insulin  Dysphagia N.p.o. Until breathing @ 50cc  Other Stroke Risk Factors  Advanced Age >/= 15   Hx stroke/TIA  Family hx stroke    Coronary artery disease ESRD on HD / CRRT, Cre 1.23  Other Active Problems    Hospital day # 5  This patient is critically ill due to stroke, seizure, non-STEMI, liver failure, cardiomyopathy, leukocytosis, and respite failure and at significant risk of neurological worsening, death form recurrent stroke, status epilepticus, heart failure, liver failure, renal failure, sepsis. This patient's care requires constant monitoring of vital signs, hemodynamics,  respiratory and cardiac monitoring, review of multiple databases, neurological assessment, discussion with family, other specialists and medical decision making of high complexity. I spent 45 minutes of neurocritical care time in the care of this patient. I had long discussion with daughter at bedside, updated pt current condition, treatment plan and potential prognosis, and answered all the questions.  She expressed understanding and appreciation.   Rosalin Hawking, MD PhD Stroke Neurology 04/12/2021 7:48 PM    To contact Stroke Continuity provider, please refer to [email or web address] After hours, contact General Neurology

## 2021-04-12 NOTE — Progress Notes (Signed)
I was asked to assess the patient for recommendations given ongoing shock and atrial fibrillation. Has been having fluctuating heart rhythm from sinus bradycardia to atrial fib/flutter with RVR. Has known cardiomyopathy with EF 25-30% on recent echo, however shock likely mixed. Given recent elevated lactate and vasoactive medication requirements, unclear if afib is result of shock or worsening hemodynamics.  Agree that trial of 150 mg bolus of amiodarone is reasonable. She converted to sinus bradycardia when arrived, however tachycardia could be driven by worsening shock.  Appears volume down on exam with low CVP. Recommend trial of fluid bolus as well as limiting UF volume removal with CRRT.   Cardiology will continue to follow.  Billey Chang, MD Cardiology

## 2021-04-13 ENCOUNTER — Inpatient Hospital Stay (HOSPITAL_COMMUNITY): Payer: Medicare Other

## 2021-04-13 DIAGNOSIS — I214 Non-ST elevation (NSTEMI) myocardial infarction: Secondary | ICD-10-CM | POA: Diagnosis not present

## 2021-04-13 LAB — COMPREHENSIVE METABOLIC PANEL
ALT: 1305 U/L — ABNORMAL HIGH (ref 0–44)
AST: 1304 U/L — ABNORMAL HIGH (ref 15–41)
Albumin: 2.4 g/dL — ABNORMAL LOW (ref 3.5–5.0)
Alkaline Phosphatase: 216 U/L — ABNORMAL HIGH (ref 38–126)
Anion gap: 12 (ref 5–15)
BUN: 12 mg/dL (ref 8–23)
CO2: 20 mmol/L — ABNORMAL LOW (ref 22–32)
Calcium: 9 mg/dL (ref 8.9–10.3)
Chloride: 104 mmol/L (ref 98–111)
Creatinine, Ser: 0.94 mg/dL (ref 0.44–1.00)
GFR, Estimated: >60 mL/min (ref >60)
Glucose, Bld: 190 mg/dL — ABNORMAL HIGH (ref 70–99)
Potassium: 4.5 mmol/L (ref 3.5–5.1)
Sodium: 136 mmol/L (ref 135–145)
Total Bilirubin: 1.8 mg/dL — ABNORMAL HIGH (ref 0.3–1.2)
Total Protein: 6.3 g/dL — ABNORMAL LOW (ref 6.5–8.1)

## 2021-04-13 LAB — COOXEMETRY PANEL
Carboxyhemoglobin: 0.7 % (ref 0.5–1.5)
Methemoglobin: 1.2 % (ref 0.0–1.5)
O2 Saturation: 43.8 %
Total hemoglobin: 11.4 g/dL — ABNORMAL LOW (ref 12.0–16.0)

## 2021-04-13 LAB — HEPARIN LEVEL (UNFRACTIONATED)
Heparin Unfractionated: 0.63 IU/mL (ref 0.30–0.70)
Heparin Unfractionated: 0.62 IU/mL (ref 0.30–0.70)
Heparin Unfractionated: <0.1 IU/mL — ABNORMAL LOW (ref 0.30–0.70)
Heparin Unfractionated: 0.49 IU/mL (ref 0.30–0.70)

## 2021-04-13 LAB — MAGNESIUM: Magnesium: 2.6 mg/dL — ABNORMAL HIGH (ref 1.7–2.4)

## 2021-04-13 LAB — PROTIME-INR
INR: 2.3 — ABNORMAL HIGH (ref 0.8–1.2)
Prothrombin Time: 25.6 seconds — ABNORMAL HIGH (ref 11.4–15.2)

## 2021-04-13 LAB — CBC
HCT: 34.6 % — ABNORMAL LOW (ref 36.0–46.0)
Hemoglobin: 11.2 g/dL — ABNORMAL LOW (ref 12.0–15.0)
MCH: 27.5 pg (ref 26.0–34.0)
MCHC: 32.4 g/dL (ref 30.0–36.0)
MCV: 85 fL (ref 80.0–100.0)
Platelets: 220 10*3/uL (ref 150–400)
RBC: 4.07 MIL/uL (ref 3.87–5.11)
RDW: 18.7 % — ABNORMAL HIGH (ref 11.5–15.5)
WBC: 31.5 10*3/uL — ABNORMAL HIGH (ref 4.0–10.5)
nRBC: 34.1 % — ABNORMAL HIGH (ref 0.0–0.2)

## 2021-04-13 LAB — RENAL FUNCTION PANEL
Albumin: 2.4 g/dL — ABNORMAL LOW (ref 3.5–5.0)
Anion gap: 11 (ref 5–15)
BUN: 16 mg/dL (ref 8–23)
CO2: 22 mmol/L (ref 22–32)
Calcium: 8.9 mg/dL (ref 8.9–10.3)
Chloride: 101 mmol/L (ref 98–111)
Creatinine, Ser: 0.98 mg/dL (ref 0.44–1.00)
GFR, Estimated: >60 mL/min (ref >60)
Glucose, Bld: 371 mg/dL — ABNORMAL HIGH (ref 70–99)
Phosphorus: 1.6 mg/dL — ABNORMAL LOW (ref 2.5–4.6)
Potassium: 4.8 mmol/L (ref 3.5–5.1)
Sodium: 134 mmol/L — ABNORMAL LOW (ref 135–145)

## 2021-04-13 LAB — CULTURE, BLOOD (ROUTINE X 2): Special Requests: ADEQUATE

## 2021-04-13 LAB — GLUCOSE, CAPILLARY
Glucose-Capillary: 157 mg/dL — ABNORMAL HIGH (ref 70–99)
Glucose-Capillary: 147 mg/dL — ABNORMAL HIGH (ref 70–99)
Glucose-Capillary: 187 mg/dL — ABNORMAL HIGH (ref 70–99)
Glucose-Capillary: 288 mg/dL — ABNORMAL HIGH (ref 70–99)
Glucose-Capillary: 229 mg/dL — ABNORMAL HIGH (ref 70–99)
Glucose-Capillary: 269 mg/dL — ABNORMAL HIGH (ref 70–99)

## 2021-04-13 LAB — PROCALCITONIN: Procalcitonin: 3.38 ng/mL

## 2021-04-13 MED ORDER — SODIUM CHLORIDE 0.9 % IV BOLUS
250.0000 mL | Freq: Once | INTRAVENOUS | Status: AC
  Administered 2021-04-13: 250 mL via INTRAVENOUS

## 2021-04-13 NOTE — Progress Notes (Signed)
STROKE TEAM PROGRESS NOTE   INTERVAL HISTORY RN is at the bedside. Pt still intubated, minimally responsive. Eyes half way open, not following commands. On heparin IV and 2 pressors. Vitals stable. Family still not decided on further care plan.     Vitals:   04/13/21 1000 04/13/21 1015 04/13/21 1030 04/13/21 1045  BP: 130/63 (!) 100/58 (!) 149/68 120/60  Pulse: (!) 52 (!) 52 (!) 50 (!) 53  Resp: 19 16 (!) 22 18  Temp:      TempSrc:      SpO2: 100% 100% 100% 100%  Weight:      Height:       CBC:  Recent Labs  Lab 04/10/21 0500 04/11/21 0430 04/12/21 0353 04/12/21 0500 04/13/21 0500  WBC 18.3*   < > 26.8*  --  31.5*  NEUTROABS 16.5*  --  24.7*  --   --   HGB 10.0*   < > 10.6* 12.2 11.2*  HCT 29.9*   < > 32.5* 36.0 34.6*  MCV 82.4   < > 84.0  --  85.0  PLT 246   < > 245  --  220   < > = values in this interval not displayed.   Basic Metabolic Panel:  Recent Labs  Lab 04/11/21 1632 04/12/21 0353 04/12/21 0500 04/12/21 1527 04/13/21 0500  NA 136 137   < > 136 136  K 3.7 3.9   < > 4.3 4.5  CL 103 104  --  103 104  CO2 21* 23  --  23 20*  GLUCOSE 223* 233*  --  277* 190*  BUN 17 14  --  12 12  CREATININE 1.49* 1.23*  --  1.02* 0.94  CALCIUM 8.7* 8.8*  --  8.9 9.0  MG  --  2.6*  --   --  2.6*  PHOS 2.2*  --   --  1.5*  --    < > = values in this interval not displayed.    Lipid Panel:  Recent Labs  Lab 04/01/2021 0454  CHOL 165  TRIG 62  HDL 63  CHOLHDL 2.6  VLDL 12  LDLCALC 90    HgbA1c:  Recent Labs  Lab 03/13/2021 0454  HGBA1C 7.0*   Urine Drug Screen: No results for input(s): LABOPIA, COCAINSCRNUR, LABBENZ, AMPHETMU, THCU, LABBARB in the last 168 hours.  Alcohol Level No results for input(s): ETH in the last 168 hours.  IMAGING past 24 hours DG Chest Port 1 View  Result Date: 04/13/2021 CLINICAL DATA:  Respiratory failure EXAM: PORTABLE CHEST 1 VIEW COMPARISON:  Chest radiograph from one day prior. FINDINGS: Endotracheal tube tip is 4.3 cm above the  carina. Insert stat right internal jugular central venous catheter terminates in the middle third of the SVC. Left internal jugular central venous catheter terminates at the cavoatrial junction. Stable cardiomediastinal silhouette with top-normal heart size. No pneumothorax. No pleural effusion. Lungs appear clear, with no acute consolidative airspace disease and no pulmonary edema. IMPRESSION: 1. Well-positioned support structures. No pneumothorax. 2. No active cardiopulmonary disease. Electronically Signed   By: Ilona Sorrel M.D.   On: 04/13/2021 07:37    PHYSICAL EXAM  Temp:  [93.7 F (34.3 C)-96.4 F (35.8 C)] 96.4 F (35.8 C) (09/04 0800) Pulse Rate:  [47-154] 53 (09/04 1045) Resp:  [16-29] 18 (09/04 1045) BP: (69-149)/(40-119) 120/60 (09/04 1045) SpO2:  [91 %-100 %] 100 % (09/04 1045) FiO2 (%):  [40 %] 40 % (09/04 0800) Weight:  [67.8 kg] 67.8  kg (09/04 0500)  General - Well nourished, well developed, intubated not on sedation.  Ophthalmologic - fundi not visualized due to noncooperation.  Cardiovascular - regular rhythm and rate, not on afib today  Neuro - intubated not on sedation, eyes half way open spontaneously, not following commands. With forced eye opening, eyes in mid position, not blinking to visual threat, doll's eyes present but sluggish, not tracking, pupil bilaterally 57m, no obvious pupillary reflexes. Corneal reflex present bilaterally, gag and cough weakly present. Breathing over the vent.  Facial symmetry not able to test due to ET tube.  Tongue protrusion not cooperative. On pain stimulation, no movement of extremities except RUE flicker withdraw. DTR diminished and no babinski. Sensation, coordination and gait not tested.    ASSESSMENT/PLAN Ms. CMaiysha Fehleris a 65y.o. female with history of atrial fibrillation on coumadin, chronic combined systolic and diastolic CHF with EF of 2123XX123 ESRD on HD M,W,F, CAD s/p PCI to LAD in 2017, RCA 2019, hyperlipidemia,  essential hypertension, stroke without residual deficits, type 2 diabetes mellitus, and a recent AV graft placement of the left arm on 8/25, who presented to the ED 04/09/2021 for evaluation of 2 days of malaise, weakness, fatigue, and decreased appetite and was found to have an NSTEMI.   She was to have a cardiac cath and anticoagulation was discontinued and she was placed on heparin drip. The cath did not happen because developed significantly altered mentation. She was seen staring into space and had left arm twitching concerning for seizure activity.   Stroke:  left ACA scattered and left cerebellum infarcts, embolic pattern, secondary to AF with subtherapeutic INR/on and off AC vs. NSTEMI vs. cardiomyopathy CT head: Small infarcts within the bilateral cerebellar hemispheres are new as compared to the brain MRI of 06/17/2019, but otherwise age-indeterminate. Re-demonstrated chronic lacunar infarcts within bilateral basal ganglia. Re-demonstrated chronic lacunar infarct within the right thalamus.  MRI : Multiple small acute/subacute infarcts in the left ACA and left PICA territories, suggesting embolic events.  CTA Head - Occlusion of the left A1 ACA and multifocal severe stenosis of the left A2 and A3 ACA. Severe stenosis of the right A3 ACA. Moderate right and severe left paraclinoid ICA stenosis.  Rounded filling defect in the left paraclinoid ICA could represent atherosclerotic plaque and/or intraluminal thrombus. Multifocal severe bilateral P2 PCA stenosis. Multifocal severe stenosis of a left M2 MCA branch and multifocal mild to moderate right M2 MCA stenosis. Moderate stenosis of bilateral proximal intradural vertebral arteries.  CTA Neck - Approximately 60-70% stenosis of the left common carotid artery.  2D Echo - LVEF 25-30%, LA dilated, lipotamous interatrial septum.  LDL 90 HgbA1c 7.0 VTE prophylaxis - Heparin IV warfarin and ASA 81 daily prior to admission, now on heparin IV.   Therapy  recommendations: pending Disposition: Poor prognosis, per Dr. OAnder Slade family has not made decision yet  Chronic A. Fib On Coumadin prior to admission Initial INR 1.2, subtherapeutic On and off Coumadin for surgery/procedure Now on heparin IV Cardiology on board  Seizure staring into space and had left arm twitching EEG periodic discharges on the ictal -interictal continuum, as well as moderate diffuse encephalopathy, but no seizures seen.  loaded with keppra 1.5gm and continued on keppra '750mg'$  BID.  NSTEMI Cardiomyopathy CHF 2D echo showed EF 25 to 30% Cardiology on board On heparin IV  Acute liver failure Significantly elevated AST/ALT on presentation AST/ALT steady trending down INR 1.2->...->2.5->2.3 - most likely falsely elevated d/t liver disease. Monitor AST/ALT  Leukocytosis WBC 20.0-> 26.8->31.5 1/2 positive blood culture On Rocephin CCM on board  Respiratory failure Intubated on ventilation Not a candidate for extubation due to mental status CCM on board Encourage family to discuss about [place]  History of hypertension Hypotension Home meds:  amlodipine, coreg, hydralazine, Imdur  Unstable On Levophed and vasopressin Long-term BP goal normotensive  Hyperlipidemia Home meds:  lipitor 40 LDL 90, goal < 70 Statin not resumed in hospital d/t liver failure May consider statin once liver function normalizes  Diabetes type II Controlled Home meds:  glucotrol, insulin HgbA1c 7.0, goal < 7.0 Hyperglycemia CBGs SSI On insulin  Dysphagia N.p.o. Until breathing @ 50cc  Other Stroke Risk Factors  Advanced Age >/= 60   Hx stroke/TIA  Family hx stroke    Coronary artery disease ESRD on HD / CRRT, Cre 1.23->0.94  Other Active Problems    Hospital day # 6  This patient is critically ill due to stroke, seizure, NSTEMI, liver failure, CHF, leukocytosis, ESRD on HD with AKI, respiratory failure on vent, dysphagia on TF and at significant risk of  neurological worsening, death form recurrent stroke, sepsis, heart failure, cardiac arrest, renal failure, status epilepticus. This patient's care requires constant monitoring of vital signs, hemodynamics, respiratory and cardiac monitoring, review of multiple databases, neurological assessment, discussion with family, other specialists and medical decision making of high complexity. I spent 35 minutes of neurocritical care time in the care of this patient. I discussed with Dr. Ander Slade CCM   Rosalin Hawking, MD PhD Stroke Neurology 04/13/2021 11:29 AM    To contact Stroke Continuity provider, please refer to [email or web address] After hours, contact General Neurology

## 2021-04-13 NOTE — Plan of Care (Signed)
  Problem: Clinical Measurements: Goal: Will remain free from infection Outcome: Progressing Goal: Diagnostic test results will improve Outcome: Progressing Goal: Respiratory complications will improve Outcome: Progressing Goal: Cardiovascular complication will be avoided Outcome: Progressing   Problem: Activity: Goal: Risk for activity intolerance will decrease Outcome: Progressing   Problem: Nutrition: Goal: Adequate nutrition will be maintained Outcome: Progressing   Problem: Coping: Goal: Level of anxiety will decrease Outcome: Progressing   Problem: Elimination: Goal: Will not experience complications related to bowel motility Outcome: Progressing Goal: Will not experience complications related to urinary retention Outcome: Progressing   Problem: Pain Managment: Goal: General experience of comfort will improve Outcome: Progressing   Problem: Safety: Goal: Ability to remain free from injury will improve Outcome: Progressing   Problem: Skin Integrity: Goal: Risk for impaired skin integrity will decrease Outcome: Progressing   Problem: Education: Goal: Knowledge of disease or condition will improve Outcome: Progressing Goal: Knowledge of secondary prevention will improve Outcome: Progressing Goal: Knowledge of patient specific risk factors addressed and post discharge goals established will improve Outcome: Progressing   Problem: Health Behavior/Discharge Planning: Goal: Ability to manage health-related needs will improve Outcome: Progressing   Problem: Nutrition: Goal: Risk of aspiration will decrease Outcome: Progressing Goal: Dietary intake will improve Outcome: Progressing   Problem: Ischemic Stroke/TIA Tissue Perfusion: Goal: Complications of ischemic stroke/TIA will be minimized Outcome: Progressing   Problem: Activity: Goal: Ability to tolerate increased activity will improve Outcome: Progressing

## 2021-04-13 NOTE — Progress Notes (Addendum)
NAME:  Amber Young, MRN:  [id number], DOB:  1955/11/14, LOS: 6 ADMISSION DATE:  04/09/2021, CONSULTATION DATE:  04/09/21 REFERRING MD: [facility] hospital[facility], CHIEF COMPLAINT:  Acute respiratory failure and altered mental status in the setting of benign elevated ST MI with troponins of 14,000 and in a patient with end-stage renal disease.   History of Present Illness:  Amber Young is a 65 year old female her of health issues significant for but not limited to atrial fibrillation combined systolic and diastolic heart failure with last 2D showing EF of 40 to 45% in 2019.  She has also end-stage renal disease and has a tunneled catheter and a new AV graft placed on 8/25 left forearm.  She had the graft placed on 8/25 and return to [facility] on 03/12/2021 with general malaise not feeling well left-sided chest pain troponin greater than 14,000.  She was scheduled for cardiac catheterization on 03/24/2021 but patient was unable to complete at a time due to pain and discomfort and refusing the procedure. 04/09/2021 she has been transferred to the intensive care unit full mechanical ventilatory support anticoagulation is on hold neurology will be consulted and continue current level of intensive care treatment.  Pertinent  Medical History   Past Medical History:  Diagnosis Date   Anemia    Atrial fibrillation ([place]) 06/17/2019   Cataract, bilateral    Chronic combined systolic and diastolic CHF (congestive heart failure) ([place])    a. EF 40-45% in 2019 b. EF 25-30% by repeat echo in 01/2019   CKD (chronic kidney disease), stage IV (HCC)    Coronary artery disease 08/2017   DES x 2 RCA   Dialysis patient ([place]) 07/2019   DKA (diabetic ketoacidoses) 06/16/2019   GERD (gastroesophageal reflux disease)    Glaucoma, left eye    Heart murmur    Hyperlipidemia    Hypertension    Proliferative diabetic retinopathy ([place])    left eye with vitreous hemorrhage and tractional retinal detachment   Stroke  ([place]) 09/2014   numbness left upper lip, finger tips on left hand; "resolved" (05/18/2016)   Type II diabetes mellitus ([place])    Wears glasses      Significant Hospital Events: Including procedures, antibiotic start and stop dates in addition to other pertinent events   8/31 intubated on the floor 8/31 started on CRRT  MRI brain with multiple small infarcts in left ACA, PICA territories suggestive of embolic events. 9/1 Levophed for hypotension 9/2 CTA head, multivessel narrowing, started on low-dose anticoagulation, continuous EEG did not show seizures-discontinued 9/2   Interim History / Subjective:  Stable overnight No overnight events In sinus rhythm Objective   Blood pressure 120/60, pulse (!) 53, temperature (!) 96.4 F (35.8 C), temperature source Rectal, resp. rate 18, height '5\' 7"'$  (1.702 m), weight 67.8 kg, SpO2 100 %. CVP:  [6 mmHg-16 mmHg] 6 mmHg  Vent Mode: PRVC FiO2 (%):  [40 %] 40 % Set Rate:  [18 bmp] 18 bmp Vt Set:  [490 mL] 490 mL PEEP:  [5 cmH20] 5 cmH20 Plateau Pressure:  [20 cmH20-26 cmH20] 23 cmH20   Intake/Output Summary (Last 24 hours) at 04/13/2021 1102 Last data filed at 04/13/2021 1100 Gross per 24 hour  Intake 2756.77 ml  Output 3673 ml  Net -916.23 ml   Filed Weights   04/11/21 0442 04/12/21 0425 04/13/21 0500  Weight: 68.3 kg 68.1 kg 67.8 kg    Examination: General: No sedation, comfortable HENT: Moist oral mucosa, endotracheal tube in place Lungs: Clear  breath sounds Cardiovascular: S1-S2 appreciated, Abdomen: Bowel sounds appreciated Extremities: No clubbing, no edema Neuro: Not opening eyes to name calling, not following commands GU: On ultrafiltration   04/09/21-2D echo  1. Left ventricular ejection fraction, by estimation, is 25 to 30%. The  left ventricle has severely decreased function. The left ventricle  demonstrates global hypokinesis. There is moderate left ventricular  hypertrophy. Left ventricular diastolic  function could  not be evaluated. Elevated left ventricular end-diastolic  pressure. There is akinesis of the left ventricular, mid-apical septal  wall.   2. Right ventricular systolic function is mildly reduced. The right  ventricular size is normal.   3. Left atrial size was moderately dilated.   4. Right atrial size was mildly dilated.   5. The mitral valve is abnormal. Mild mitral valve regurgitation. No  evidence of mitral stenosis. Moderate to severe mitral annular  calcification.   6. The aortic valve is grossly normal. There is mild calcification of the  aortic valve. There is mild thickening of the aortic valve. Aortic valve  regurgitation is not visualized. Mild to moderate aortic valve  sclerosis/calcification is present, without   any evidence of aortic stenosis.  9/1 MRI brain >> shows an acute left cerebellar stroke, as well as remote strokes at bilateral cerebellar hemispheres and right thalamus, suggestive of embolic eents affecting the left ACA and left PICA territories 04/10/2021 EEG>> This study is suggestive of moderate diffuse encephalopathy, nonspecific etiology but could be secondary to toxic-metabolic causes. No seizures were seen throughout the recording. 9/2 CT headCTA head:  1. Occlusion of the left A1 ACA and multifocal severe stenosis of the left A2 and A3 ACA. Severe stenosis of the right A3 ACA. 2. Moderate right and severe left paraclinoid ICA stenosis. Rounded filling defect in the left paraclinoid ICA could represent atherosclerotic plaque and/or intraluminal thrombus. 3. Multifocal severe bilateral P2 PCA stenosis. 4. Multifocal severe stenosis of a left M2 MCA branch and multifocal mild to moderate right M2 MCA stenosis. 5. Moderate stenosis of bilateral proximal intradural vertebral arteries.  CTA neck:  1. Approximately 60-70% stenosis of the left common carotid artery. 2. Limited evaluation of the proximal vertebral arteries with likely severe left vertebral artery  origin stenosis. 3. Small and irregular internal carotid arteries with long/smooth segment narrowing and luminal diameter of only a few mm bilaterally at C1-C2.  CT head:  1. Slightly increased edema associated with known acute infarcts in the left cerebellum and high parasagittal left parietal lobe. No progressive mass effect. 2. Moderate chronic microvascular ischemic disease and mild to moderate atrophy.   9/3 chest x-ray-within normal limits, reviewed by myself  ABG with mild respiratory alkalosis Leukocytosis  Resolved Hospital Problem list     Assessment & Plan:  Ventilator dependent respiratory failure in the setting of altered mental status, bradycardia, metabolic disarray, ST elevation MI with troponins in the 14,000, questionable seizure activity -Continue full vent support -Neuro status currently barrier to weaning -Has not been on scheduled sedation, has not needed any  Altered mental status with questionable left arm seizure activity Multiple embolic infarcts secondary to atrial fibrillation Myocardial infarction -Continue anticoagulation for CVA -Continue Keppra  Elevated liver enzymes Elevated ammonia Elevated lactate from poor clearance -Appears to be improving -Discontinue N-acetylcysteine today 9/4  Leukocytosis 1 in 8 blood culture  positive On Rocephin-from 9/2 Was on piperacillin/vancomycin prior -Will order new cultures -Continue current antibiotic coverage -Chest x-ray with no infiltrate  Non-ST elevation MI with troponins in the 14,000  Unable to go to cardiac evaluation Atrial fibrillation History of congestive heart failure -On vasopressin and Levophed  Diabetes -Sliding scale insulin -Enteral tube feed coverage  Diarrhea -place flexi seal  End-stage renal disease on CRRT  Prognosis is guarded with extensive vascular pathology, multiple strokes, background of end-stage renal disease, new myocardial infarction, atrial  fibrillation  Goals of care discussion with spouse at bedside today 9/4 -We will continue full scope of care at present  Best Practice (right click and "Reselect all SmartList Selections" daily)   Diet/type: tubefeeds DVT prophylaxis: systemic heparin GI prophylaxis: H2B Lines: Central line Foley:  Yes, and it is still needed Code Status:  full code Last date of multidisciplinary goals of care discussion [9/3-updated spouse at bedside ]  Labs   CBC: Recent Labs  Lab 03/26/2021 0454 04/09/21 0248 04/09/21 0930 04/10/21 0500 04/11/21 0430 04/12/21 0353 04/12/21 0500 04/13/21 0500  WBC 8.5 12.2*  --  18.3* 20.0* 26.8*  --  31.5*  NEUTROABS 6.3  --   --  16.5*  --  24.7*  --   --   HGB 9.6* 11.2*   < > 10.0* 10.5* 10.6* 12.2 11.2*  HCT 30.0* 34.5*   < > 29.9* 32.1* 32.5* 36.0 34.6*  MCV 84.7 83.5  --  82.4 84.9 84.0  --  85.0  PLT 256 297  --  246 250 245  --  220   < > = values in this interval not displayed.    Basic Metabolic Panel: Recent Labs  Lab 04/09/21 0248 04/09/21 0930 04/09/21 1450 04/10/21 0500 04/10/21 1830 04/11/21 0430 04/11/21 1632 04/12/21 0353 04/12/21 0500 04/12/21 1527 04/13/21 0500  NA 137   < > 136 137 138 133* 136 137 139 136 136  K 5.5*   < > 6.0* 4.0 3.3* 3.8 3.7 3.9 3.9 4.3 4.5  CL 98  --  95* 100 100 100 103 104  --  103 104  CO2 17*  --  17* 21* 22 21* 21* 23  --  23 20*  GLUCOSE 105*  --  158* 99 228* 223* 223* 233*  --  277* 190*  BUN 34*  --  47* 39* '21 17 17 14  '$ --  12 12  CREATININE 5.43*  --  6.22* 4.72* 2.24* 1.68* 1.49* 1.23*  --  1.02* 0.94  CALCIUM 9.0  --  9.2 8.8* 8.4* 8.7* 8.7* 8.8*  --  8.9 9.0  MG 2.3  --   --  2.6*  --  2.6*  --  2.6*  --   --  2.6*  PHOS  --   --  9.4* 5.2*  5.2* 3.1  --  2.2*  --   --  1.5*  --    < > = values in this interval not displayed.   GFR: Estimated Creatinine Clearance: 58 mL/min (by C-G formula based on SCr of 0.94 mg/dL). Recent Labs  Lab 03/10/2021 1442 03/30/2021 0454 04/09/21 0248  04/10/21 0500 04/10/21 0755 04/10/21 2255 04/11/21 0430 04/12/21 0350 04/12/21 0353 04/13/21 0500  PROCALCITON  --  4.29  4.30 3.85  --   --   --   --   --  3.86 3.38  WBC  --  8.5 12.2* 18.3*  --   --  20.0*  --  26.8* 31.5*  LATICACIDVEN 2.9*  --   --   --  3.5* 3.7*  --  3.2*  --   --     Liver Function Tests:  Recent Labs  Lab 04/09/21 0248 04/09/21 1450 04/10/21 0500 04/10/21 1830 04/11/21 0430 04/11/21 1632 04/12/21 0353 04/12/21 1527 04/13/21 0500  AST 1,363*  --  9,649*  --  4,440*  --  2,240*  --  1,304*  ALT 372*  --  2,575*  --  1,923*  --  1,440*  --  1,305*  ALKPHOS 115  --  119  --  151*  --  203*  --  216*  BILITOT 1.5*  --  2.2*  --  2.3*  --  2.0*  --  1.8*  PROT 6.7  --  6.2*  --  6.1*  --  5.9*  --  6.3*  ALBUMIN 3.2*   < > 3.0*  2.9*   < > 2.5* 2.4* 2.4* 2.4* 2.4*   < > = values in this interval not displayed.   Recent Labs  Lab 04/06/2021 0806  LIPASE 26   Recent Labs  Lab 03/12/2021 1552 04/10/21 1442 04/12/21 0350  AMMONIA 68* 47* 26    ABG    Component Value Date/Time   PHART 7.450 04/12/2021 0500   PCO2ART 32.5 04/12/2021 0500   PO2ART 109 (H) 04/12/2021 0500   HCO3 22.7 04/12/2021 0500   TCO2 24 04/12/2021 0500   ACIDBASEDEF 1.0 04/12/2021 0500   O2SAT 43.8 04/13/2021 0530     Coagulation Profile: Recent Labs  Lab 04/10/21 0536 04/10/21 2240 04/11/21 0430 04/12/21 0353 04/13/21 0500  INR 3.1* 3.2* 3.1* 2.5* 2.3*    Cardiac Enzymes: No results for input(s): CKTOTAL, CKMB, CKMBINDEX, TROPONINI in the last 168 hours.  HbA1C: Hemoglobin A1C  Date/Time Value Ref Range Status  06/28/2020 10:13 AM 8.5 (A) 4.0 - 5.6 % Final   HbA1c, POC (prediabetic range)  Date/Time Value Ref Range Status  06/28/2020 10:13 AM 8.5 (A) 5.7 - 6.4 % Final   HbA1c, POC (controlled diabetic range)  Date/Time Value Ref Range Status  06/28/2020 10:13 AM 8.5 (A) 0.0 - 7.0 % Final   HbA1c POC (<> result, manual entry)  Date/Time Value Ref  Range Status  06/28/2020 10:13 AM 8.5 4.0 - 5.6 % Final   Hgb A1c MFr Bld  Date/Time Value Ref Range Status  03/27/2021 04:54 AM 7.0 (H) 4.8 - 5.6 % Final    Comment:    (NOTE) Pre diabetes:          5.7%-6.4%  Diabetes:              >6.4%  Glycemic control for   <7.0% adults with diabetes   06/29/2019 10:11 PM 8.9 (H) 4.8 - 5.6 % Final    Comment:    (NOTE) Pre diabetes:          5.7%-6.4% Diabetes:              >6.4% Glycemic control for   <7.0% adults with diabetes     CBG: Recent Labs  Lab 04/12/21 1524 04/12/21 1910 04/12/21 2322 04/13/21 0338 04/13/21 0729  GLUCAP 241* 261* 202* 157* 147*    Review of Systems:   Not interactive  Past Medical History:  She,  has a past medical history of Anemia, Atrial fibrillation ([place]) (06/17/2019), Cataract, bilateral, Chronic combined systolic and diastolic CHF (congestive heart failure) ([place]), CKD (chronic kidney disease), stage IV ([place]), Coronary artery disease (08/2017), Dialysis patient ([place]) (07/2019), DKA (diabetic ketoacidoses) (06/16/2019), GERD (gastroesophageal reflux disease), Glaucoma, left eye, Heart murmur, Hyperlipidemia, Hypertension, Proliferative diabetic retinopathy ([place]), Stroke ([place]) (09/2014), Type II diabetes mellitus ([place]), and  Wears glasses.   Surgical History:   Past Surgical History:  Procedure Laterality Date   AV FISTULA PLACEMENT Left 07/10/2019   Procedure: ARTERIOVENOUS (AV) FISTULA CREATION;  Surgeon: Angelia Mould, MD;  Location: [facility];  Service: Vascular;  Laterality: Left;   AV FISTULA PLACEMENT Left 04/03/2021   Procedure: INSERTION OF LEFT UPPER ARM ARTERIOVENOUS (AV) GORE-TEX GRAFT;  Surgeon: Serafina Mitchell, MD;  Location: [facility];  Service: Vascular;  Laterality: Left;   CARDIAC CATHETERIZATION N/A 05/18/2016   Procedure: Left Heart Cath and Coronary Angiography;  Surgeon: Jettie Booze, MD;  Location: [place] [facility];  Service: Cardiovascular;  Laterality: N/A;    CARDIAC CATHETERIZATION N/A 05/18/2016   Procedure: Coronary Stent Intervention;  Surgeon: Jettie Booze, MD;  Location: [place] [facility];  Service: Cardiovascular;  Laterality: N/A;   CATARACT EXTRACTION Right 2020   COLONOSCOPY W/ BIOPSIES AND POLYPECTOMY     CORONARY ANGIOPLASTY     CORONARY ANGIOPLASTY WITH STENT PLACEMENT     CORONARY STENT INTERVENTION N/A 09/07/2017   Procedure: CORONARY STENT INTERVENTION;  Surgeon: Leonie Man, MD;  Location: [facility];  Service: Cardiovascular;  Laterality: N/A;   EYE SURGERY     GAS INSERTION Left 01/13/2018   Procedure: INSERTION OF GAS;  Surgeon: Bernarda Caffey, MD;  Location: [facility];  Service: Ophthalmology;  Laterality: Left;   INSERTION OF DIALYSIS CATHETER Right 07/10/2019   Procedure: INSERTION OF TUNNELED DIALYSIS CATHETER;  Surgeon: Angelia Mould, MD;  [facility];  Service: Vascular;  Laterality: Right;   IR FLUORO GUIDE CV LINE RIGHT  07/05/2019   IR US GUIDE VASC ACCESS RIGHT  07/05/2019   LEFT HEART CATH AND CORONARY ANGIOGRAPHY N/A 09/07/2017   Procedure: LEFT HEART CATH AND CORONARY ANGIOGRAPHY;  Surgeon: Leonie Man, MD;  Location: [facility];  Service: Cardiovascular;  Laterality: N/A;   LEFT HEART CATH AND CORONARY ANGIOGRAPHY N/A 03/22/2021   Procedure: LEFT HEART CATH AND CORONARY ANGIOGRAPHY;  Surgeon: Belva Crome, MD;  Location: [place] [facility];  Service: Cardiovascular;  Laterality: N/A;  Canceled   MEMBRANE PEEL Left 01/13/2018   Procedure: MEMBRANE PEEL LEFT EYE ;  Surgeon: Bernarda Caffey, MD;  Location: [facility];  Service: Ophthalmology;  Laterality: Left;   MULTIPLE TOOTH EXTRACTIONS     PARS PLANA VITRECTOMY Left 01/13/2018   Procedure: PARS PLANA VITRECTOMY WITH 25 GAUGE LEFT EYE WITH ENDOLASER;  Surgeon: Bernarda Caffey, MD;  Location: [facility];  Service: Ophthalmology;  Laterality: Left;   [place]   "partial; fibroids"     Social History:    reports that she has never smoked. She has never used smokeless tobacco. She reports that she does not drink alcohol and does not use drugs.   Family History:  Her family history includes COPD in her mother; Diabetes in her mother; Heart failure in her mother; Hypertension in her brother, mother, and sister.   Allergies Allergies  Allergen Reactions   Ace Inhibitors Cough    The patient is critically ill with multiple organ systems failure and requires high complexity decision making for assessment and support, frequent evaluation and titration of therapies, application of advanced monitoring technologies and extensive interpretation of multiple databases. Critical Care Time devoted to patient care services described in this note independent of APP/resident time (if applicable)  is 35 minutes.   Sherrilyn Rist MD [facility] Personal  pager: See Shea Evans If unanswered, please page CCM On-call: [phone]

## 2021-04-13 NOTE — Progress Notes (Signed)
[facility] ROUNDING NOTE   Subjective:   Interval History: This is a 65-year-old lady end-stage renal disease secondary to diabetes.  She has a history of atrial fibrillation and congestive heart failure with systolic dysfunction ejection fraction 25 to 30%.  She has a history of coronary artery disease and status post PTCA.  She is now admitted with an NSTEMI.  Her usual dialysis days are Monday Wednesday Friday.     She has a postop hematoma from recent left AV graft placement.  04/03/2021.  Patient underwent dialysis 04/06/2021.  He did not receive a cardiac catheterization due to pain and discomfort refusing procedure.  She developed acute dyspnea 04/09/2021 and was transferred to the intensive care unit for mechanical ventilation.  Appreciate assistance from critical care.  She started CRRT 04/09/2021. 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  CT angio head and neck notable for multifocal multivessel extra and intracranial stenosis.  There is an additional round filling defect in the left paraclinoid ICA.  She continues on IV anticoagulation  Blood pressure 113/72 pulse 55 temperature 95.9 O2 sats 90% FiO2 40%   Sodium 136 potassium 4.5 chloride 104 CO2 20 BUN 612 creatinine 0.94 glucose 190 albumin 2.4   IV norepinephrine IV vasopressin IV heparin      Objective:  Vital signs in last 24 hours:  Temp:  [93.7 F (34.3 C)-95.9 F (35.5 C)] 95.9 F (35.5 C) (09/04 0626) Pulse Rate:  [47-154] 55 (09/04 0733) Resp:  [16-29] 27 (09/04 0733) BP: (69-162)/(40-140) 113/72 (09/04 0530) SpO2:  [91 %-100 %] 100 % (09/04 0733) FiO2 (%):  [40 %] 40 % (09/04 0733) Weight:  [67.8 kg] 67.8 kg (09/04 0500)  Weight change: -0.3 kg Filed Weights   04/11/21 0442 04/12/21 0425 04/13/21 0500  Weight: 68.3 kg 68.1 kg 67.8 kg    Intake/Output: I/O last 3 completed shifts: In: 3806.8 [I.V.:1470.8; NG/GT:1924.2; IV Piggyback:411.8] Out: 5318 [Other:5318]    Intake/Output this shift:  No intake/output data recorded.  Patient intubated CVS- RRR mechanically supported breath sounds RS- CTA ABD- BS present hypoactive bowel sounds EXT- no edema     Basic Metabolic Panel: Recent Labs  Lab 04/09/21 0248 04/09/21 0930 04/09/21 1450 04/10/21 0500 04/10/21 1830 04/11/21 0430 04/11/21 1632 04/12/21 0353 04/12/21 0500 04/12/21 1527 04/13/21 0500  NA 137   < > 136 137 138 133* 136 137 139 136 136  K 5.5*   < > 6.0* 4.0 3.3* 3.8 3.7 3.9 3.9 4.3 4.5  CL 98  --  95* 100 100 100 103 104  --  103 104  CO2 17*  --  17* 21* 22 21* 21* 23  --  23 20*  GLUCOSE 105*  --  158* 99 228* 223* 223* 233*  --  277* 190*  BUN 34*  --  47* 39* 21 17 17 14  --  12 12  CREATININE 5.43*  --  6.22* 4.72* 2.24* 1.68* 1.49* 1.23*  --  1.02* 0.94  CALCIUM 9.0  --  9.2 8.8* 8.4* 8.7* 8.7* 8.8*  --  8.9 9.0  MG 2.3  --   --  2.6*  --  2.6*  --  2.6*  --   --  2.6*  PHOS  --   --  9.4* 5.2*  5.2* 3.1  --  2.2*  --   --  1.5*  --    < > = values in this interval not displayed.     Liver   Function Tests: Recent Labs  Lab 04/09/21 0248 04/09/21 1450 04/10/21 0500 04/10/21 1830 04/11/21 0430 04/11/21 1632 04/12/21 0353 04/12/21 1527 04/13/21 0500  AST 1,363*  --  9,649*  --  4,440*  --  2,240*  --  1,304*  ALT 372*  --  2,575*  --  1,923*  --  1,440*  --  1,305*  ALKPHOS 115  --  119  --  151*  --  203*  --  216*  BILITOT 1.5*  --  2.2*  --  2.3*  --  2.0*  --  1.8*  PROT 6.7  --  6.2*  --  6.1*  --  5.9*  --  6.3*  ALBUMIN 3.2*   < > 3.0*  2.9*   < > 2.5* 2.4* 2.4* 2.4* 2.4*   < > = values in this interval not displayed.    Recent Labs  Lab 03/13/2021 0806  LIPASE 26    Recent Labs  Lab 03/31/2021 1552 04/10/21 1442 04/12/21 0350  AMMONIA 68* 47* 26     CBC: Recent Labs  Lab 03/31/2021 0454 04/09/21 0248 04/09/21 0930 04/10/21 0500 04/11/21 0430 04/12/21 0353 04/12/21 0500 04/13/21 0500  WBC 8.5 12.2*  --  18.3* 20.0* 26.8*  --   31.5*  NEUTROABS 6.3  --   --  16.5*  --  24.7*  --   --   HGB 9.6* 11.2*   < > 10.0* 10.5* 10.6* 12.2 11.2*  HCT 30.0* 34.5*   < > 29.9* 32.1* 32.5* 36.0 34.6*  MCV 84.7 83.5  --  82.4 84.9 84.0  --  85.0  PLT 256 297  --  246 250 245  --  220   < > = values in this interval not displayed.     Cardiac Enzymes: No results for input(s): CKTOTAL, CKMB, CKMBINDEX, TROPONINI in the last 168 hours.  BNP: Invalid input(s): POCBNP  CBG: Recent Labs  Lab 04/12/21 1524 04/12/21 1910 04/12/21 2322 04/13/21 0338 04/13/21 0729  GLUCAP 241* 261* 202* 157* 147*     Microbiology: Results for orders placed or performed during the hospital encounter of 04/05/2021  Culture, blood (routine x 2)     Status: None   Collection Time: 04/02/2021  9:22 AM   Specimen: BLOOD RIGHT FOREARM  Result Value Ref Range Status   Specimen Description BLOOD RIGHT FOREARM  Final   Special Requests   Final    BOTTLES DRAWN AEROBIC AND ANAEROBIC Blood Culture results may not be optimal due to an inadequate volume of blood received in culture bottles   Culture   Final    NO GROWTH 5 DAYS Performed at [facility], [place] [address] [place], [place] [address]    Report Status 04/12/2021 FINAL  Final  Culture, blood (routine x 2)     Status: None   Collection Time: 03/28/2021  9:30 AM   Specimen: BLOOD RIGHT ARM  Result Value Ref Range Status   Specimen Description BLOOD RIGHT ARM  Final   Special Requests   Final    BOTTLES DRAWN AEROBIC AND ANAEROBIC Blood Culture adequate volume   Culture   Final    NO GROWTH 5 DAYS Performed at [facility], [place] [address], [place], [place] [address]    Report Status 04/12/2021 FINAL  Final  Resp Panel by RT-PCR (Flu A&B, Covid) Nasopharyngeal Swab     Status: None   Collection Time: 03/11/2021 10:04 AM   Specimen: Nasopharyngeal Swab;  Nasopharyngeal(NP) swabs in vial transport medium  Result Value Ref Range Status   SARS Coronavirus 2 by RT PCR NEGATIVE  NEGATIVE Final    Comment: (NOTE) SARS-CoV-2 target nucleic acids are NOT DETECTED.  The SARS-CoV-2 RNA is generally detectable in upper respiratory specimens during the acute phase of infection. The lowest concentration of SARS-CoV-2 viral copies this assay can detect is 138 copies/mL. A negative result does not preclude SARS-Cov-2 infection and should not be used as the sole basis for treatment or other patient management decisions. A negative result may occur with  improper specimen collection/handling, submission of specimen other than nasopharyngeal swab, presence of viral mutation(s) within the areas targeted by this assay, and inadequate number of viral copies(<138 copies/mL). A negative result must be combined with clinical observations, patient history, and epidemiological information. The expected result is Negative.  Fact Sheet for Patients:  EntrepreneurPulse.com.au  Fact Sheet for Healthcare Providers:  IncredibleEmployment.be  This test is no t yet approved or cleared by the [place] FDA and  has been authorized for detection and/or diagnosis of SARS-CoV-2 by FDA under an Emergency Use Authorization (EUA). This EUA will remain  in effect (meaning this test can be used) for the duration of the COVID-19 declaration under Section 564(b)(1) of the Act, 21 U.S.C.section 360bbb-3(b)(1), unless the authorization is terminated  or revoked sooner.       Influenza A by PCR NEGATIVE NEGATIVE Final   Influenza B by PCR NEGATIVE NEGATIVE Final    Comment: (NOTE) The Xpert Xpress SARS-CoV-2/FLU/RSV plus assay is intended as an aid in the diagnosis of influenza from Nasopharyngeal swab specimens and should not be used as a sole basis for treatment. Nasal washings and aspirates are unacceptable for Xpert Xpress SARS-CoV-2/FLU/RSV testing.  Fact Sheet for Patients: EntrepreneurPulse.com.au  Fact Sheet for Healthcare  Providers: IncredibleEmployment.be  This test is not yet approved or cleared by the [place] FDA and has been authorized for detection and/or diagnosis of SARS-CoV-2 by FDA under an Emergency Use Authorization (EUA). This EUA will remain in effect (meaning this test can be used) for the duration of the COVID-19 declaration under Section 564(b)(1) of the Act, 21 U.S.C. section 360bbb-3(b)(1), unless the authorization is terminated or revoked.  Performed at [facility], [place] [address], [place], [place] [address]   MRSA Next Gen by PCR, Nasal     Status: None   Collection Time: 04/09/21  3:25 PM   Specimen: Nasal Mucosa; Nasal Swab  Result Value Ref Range Status   MRSA by PCR Next Gen NOT DETECTED NOT DETECTED Final    Comment: (NOTE) The GeneXpert MRSA Assay (FDA approved for NASAL specimens only), is one component of a comprehensive MRSA colonization surveillance program. It is not intended to diagnose MRSA infection nor to guide or monitor treatment for MRSA infections. Test performance is not FDA approved in patients less than 54 years old. Performed at [facility], [place] [address], [place], [place] [address]   Culture, blood (Routine X 2) w Reflex to ID Panel     Status: Abnormal (Preliminary result)   Collection Time: 04/10/21  2:45 PM   Specimen: BLOOD  Result Value Ref Range Status   Specimen Description BLOOD SITE NOT SPECIFIED  Final   Special Requests   Final    BOTTLES DRAWN AEROBIC AND ANAEROBIC Blood Culture adequate volume   Culture  Setup Time   Final    GRAM POSITIVE COCCI IN CHAINS AEROBIC BOTTLE ONLY CRITICAL  RESULT CALLED TO, READ BACK BY AND VERIFIED WITH: PHARMD M.JAMES AT 1454 ON 04/11/2021 BY T.SAAD.    Culture (A)  Final    STREPTOCOCCUS MITIS/ORALIS THE SIGNIFICANCE OF ISOLATING THIS ORGANISM FROM A SINGLE SET OF BLOOD CULTURES WHEN MULTIPLE SETS ARE DRAWN IS UNCERTAIN. PLEASE NOTIFY THE MICROBIOLOGY DEPARTMENT  WITHIN ONE WEEK IF SPECIATION AND SENSITIVITIES ARE REQUIRED. Performed at [facility], [address] [place], [place] [address]    Report Status PENDING  Incomplete  Blood Culture ID Panel (Reflexed)     Status: Abnormal   Collection Time: 04/10/21  2:45 PM  Result Value Ref Range Status   Enterococcus faecalis NOT DETECTED NOT DETECTED Final   Enterococcus Faecium NOT DETECTED NOT DETECTED Final   Listeria monocytogenes NOT DETECTED NOT DETECTED Final   Staphylococcus species NOT DETECTED NOT DETECTED Final   Staphylococcus aureus (BCID) NOT DETECTED NOT DETECTED Final   Staphylococcus epidermidis NOT DETECTED NOT DETECTED Final   Staphylococcus lugdunensis NOT DETECTED NOT DETECTED Final   Streptococcus species DETECTED (A) NOT DETECTED Final    Comment: Not Enterococcus species, Streptococcus agalactiae, Streptococcus pyogenes, or Streptococcus pneumoniae. CRITICAL RESULT CALLED TO, READ BACK BY AND VERIFIED WITH: PHARMD M.JAMES AT 1454 ON 04/11/2021 BY T.SAAD.    Streptococcus agalactiae NOT DETECTED NOT DETECTED Final   Streptococcus pneumoniae NOT DETECTED NOT DETECTED Final   Streptococcus pyogenes NOT DETECTED NOT DETECTED Final   A.calcoaceticus-baumannii NOT DETECTED NOT DETECTED Final   Bacteroides fragilis NOT DETECTED NOT DETECTED Final   Enterobacterales NOT DETECTED NOT DETECTED Final   Enterobacter cloacae complex NOT DETECTED NOT DETECTED Final   Escherichia coli NOT DETECTED NOT DETECTED Final   Klebsiella aerogenes NOT DETECTED NOT DETECTED Final   Klebsiella oxytoca NOT DETECTED NOT DETECTED Final   Klebsiella pneumoniae NOT DETECTED NOT DETECTED Final   Proteus species NOT DETECTED NOT DETECTED Final   Salmonella species NOT DETECTED NOT DETECTED Final   Serratia marcescens NOT DETECTED NOT DETECTED Final   Haemophilus influenzae NOT DETECTED NOT DETECTED Final   Neisseria meningitidis NOT DETECTED NOT DETECTED Final   Pseudomonas aeruginosa NOT  DETECTED NOT DETECTED Final   Stenotrophomonas maltophilia NOT DETECTED NOT DETECTED Final   Candida albicans NOT DETECTED NOT DETECTED Final   Candida auris NOT DETECTED NOT DETECTED Final   Candida glabrata NOT DETECTED NOT DETECTED Final   Candida krusei NOT DETECTED NOT DETECTED Final   Candida parapsilosis NOT DETECTED NOT DETECTED Final   Candida tropicalis NOT DETECTED NOT DETECTED Final   Cryptococcus neoformans/gattii NOT DETECTED NOT DETECTED Final    Comment: Performed at [facility], [address] [place], [place] [address]  Culture, blood (Routine X 2) w Reflex to ID Panel     Status: None (Preliminary result)   Collection Time: 04/10/21  4:35 PM   Specimen: BLOOD RIGHT WRIST  Result Value Ref Range Status   Specimen Description BLOOD RIGHT WRIST  Final   Special Requests   Final    BOTTLES DRAWN AEROBIC AND ANAEROBIC Blood Culture adequate volume   Culture   Final    NO GROWTH 2 DAYS Performed at [facility], [address] [place],  [address]    Report Status PENDING  Incomplete    Coagulation Studies: Recent Labs    04/10/21 2240 04/11/21 0430 04/12/21 0353 04/13/21 0500  LABPROT 32.8* 32.0* 26.9* 25.6*  INR 3.2* 3.1* 2.5* 2.3*     Urinalysis: No results for input(s): COLORURINE,   LABSPEC, PHURINE, GLUCOSEU, HGBUR, BILIRUBINUR, KETONESUR, PROTEINUR, UROBILINOGEN, NITRITE, LEUKOCYTESUR in the last 72 hours.  Invalid input(s): APPERANCEUR    Imaging: CT ANGIO HEAD NECK W WO CM  Result Date: 04/11/2021 CLINICAL DATA:  Stroke suspected (Ped 0-18y) EXAM: CT ANGIOGRAPHY HEAD AND NECK TECHNIQUE: Multidetector CT imaging of the head and neck was performed using the standard protocol during bolus administration of intravenous contrast. Multiplanar CT image reconstructions and MIPs were obtained to evaluate the vascular anatomy. Carotid stenosis measurements (when applicable) are obtained utilizing NASCET criteria, using the distal internal  carotid diameter as the denominator. CONTRAST:  75mL OMNIPAQUE IOHEXOL 350 MG/ML SOLN COMPARISON:  CT Head and MRI 04/09/2021 FINDINGS: CT HEAD FINDINGS Brain: Slightly increased edema associated with known acute infarcts in the left cerebellum and high parasagittal left parietal lobe. No progressive mass effect. No acute hemorrhage. No evidence of new/interval acute large vascular territory infarct. Remote infarcts versus dilated perivascular spaces in bilateral inferior basal ganglia, similar. Additional patchy white matter hypoattenuation, nonspecific but compatible with chronic microvascular ischemic disease. Mild to moderate generalized atrophy with ex vacuo ventricular dilation. No hydrocephalus. No mass lesion. No extra-axial fluid collection. Vascular: See below Skull: No acute fracture. Sinuses: Clear sinuses. Orbits: No acute finding. Review of the MIP images confirms the above findings CTA NECK FINDINGS Aortic arch: Atherosclerosis.  Great vessel origins are patent. Right carotid system: Patent common carotid artery without significant stenosis. Atherosclerosis at the carotid bifurcation without greater than 50% stenosis. Long segment narrowing of bilateral mid to distal internal carotid artery with luminal diameter of only 1-2 mm at the C1-C2 level. Left carotid system: Mixed calcific and noncalcific atherosclerosis of the common carotid artery with r up to 60-70% stenosis. Atherosclerosis at the carotid bifurcation without greater than 50% stenosis. Long segment narrowing of bilateral mid to distal internal carotid artery with luminal diameter of only 1-2 mm at the C1-C2 level. Vertebral arteries: Limited evaluation of the proximal vertebral arteries due to streak artifact from dense contrast in veins. Likely severe stenosis of the left vertebral artery origin. Both vertebral arteries are patent more distally. Skeleton: Other neck: Upper chest: Biapical pleuroparenchymal scarring. Otherwise, lung apices  are clear. Review of the MIP images confirms the above findings CTA HEAD FINDINGS Anterior circulation: Moderate atherosclerosis of bilateral internal carotid arteries. Moderate narrowing of the right paraclinoid ICA. Severe narrowing of the left paraclinoid ICA. Rounded filling defect within the left paraclinoid ICA, which could represent atherosclerotic plaque or intraluminal thrombus. Bilateral MCAs are patent. Mild stenosis of the right M1 MCA. Multifocal severe stenosis of a left M2 MCA branch. Multifocal moderate stenosis of right M2 MCA branches. Occlusion of the left A1 ACA and multifocal severe stenosis of the left A2 ACA. Severe stenosis of the right A3 ACA. Posterior circulation: Moderate stenosis of bilateral proximal intradural vertebral arteries. Patent basilar artery. Patent bilateral posterior cerebral arteries. Multifocal severe P2 PCA stenosis bilaterally. Venous sinuses: Poorly evaluated due to arterial timing. Review of the MIP images confirms the above findings IMPRESSION: CTA head: 1. Occlusion of the left A1 ACA and multifocal severe stenosis of the left A2 and A3 ACA. Severe stenosis of the right A3 ACA. 2. Moderate right and severe left paraclinoid ICA stenosis. Rounded filling defect in the left paraclinoid ICA could represent atherosclerotic plaque and/or intraluminal thrombus. 3. Multifocal severe bilateral P2 PCA stenosis. 4. Multifocal severe stenosis of a left M2 MCA branch and multifocal mild to moderate right M2 MCA stenosis. 5. Moderate stenosis of bilateral   proximal intradural vertebral arteries. CTA neck: 1. Approximately 60-70% stenosis of the left common carotid artery. 2. Limited evaluation of the proximal vertebral arteries with likely severe left vertebral artery origin stenosis. 3. Small and irregular internal carotid arteries with long/smooth segment narrowing and luminal diameter of only a few mm bilaterally at C1-C2. CT head: 1. Slightly increased edema associated with  known acute infarcts in the left cerebellum and high parasagittal left parietal lobe. No progressive mass effect. 2. Moderate chronic microvascular ischemic disease and mild to moderate atrophy. Findings discussed with Dr. Ogan via telephone at 7:24 p.m. Electronically Signed   By: Frederick S Jones M.D.   On: 04/11/2021 19:31   DG Chest Port 1 View  Result Date: 04/13/2021 CLINICAL DATA:  Respiratory failure EXAM: PORTABLE CHEST 1 VIEW COMPARISON:  Chest radiograph from one day prior. FINDINGS: Endotracheal tube tip is 4.3 cm above the carina. Insert stat right internal jugular central venous catheter terminates in the middle third of the SVC. Left internal jugular central venous catheter terminates at the cavoatrial junction. Stable cardiomediastinal silhouette with top-normal heart size. No pneumothorax. No pleural effusion. Lungs appear clear, with no acute consolidative airspace disease and no pulmonary edema. IMPRESSION: 1. Well-positioned support structures. No pneumothorax. 2. No active cardiopulmonary disease. Electronically Signed   By: Jason A Poff M.D.   On: 04/13/2021 07:37   DG CHEST PORT 1 VIEW  Result Date: 04/12/2021 CLINICAL DATA:  Respiratory failure EXAM: PORTABLE CHEST 1 VIEW COMPARISON:  04/10/2021 chest radiograph. FINDINGS: Endotracheal tube tip is 5.0 cm above the carina. Enteric tube enters stomach with the tip not seen on this image. Stable right internal jugular and left internal jugular central venous catheters. Stable cardiomediastinal silhouette with mild cardiomegaly. No pneumothorax. No pleural effusion. Lungs appear clear, with no acute consolidative airspace disease and no pulmonary edema. Stable left axillary vascular stent. IMPRESSION: Well-positioned support structures. Stable mild cardiomegaly without pulmonary edema. No active pulmonary disease. Electronically Signed   By: Jason A Poff M.D.   On: 04/12/2021 06:29     Medications:     prismasol BGK 4/2.5 500 mL/hr at  04/13/21 0429    prismasol BGK 4/2.5 500 mL/hr at 04/13/21 0415   acetylcysteine 6.25 mg/kg/hr (04/13/21 0700)   cefTRIAXone (ROCEPHIN)  IV Stopped (04/12/21 1657)   feeding supplement (VITAL 1.5 CAL) 50 mL/hr at 04/11/21 2100   heparin 900 Units/hr (04/13/21 0700)   levETIRAcetam Stopped (04/12/21 2349)   norepinephrine (LEVOPHED) Adult infusion 15 mcg/min (04/13/21 0700)   prismasol BGK 4/2.5 2,000 mL/hr at 04/13/21 0601   vasopressin 0.03 Units/min (04/13/21 0700)    B-complex with vitamin C  1 tablet Per Tube Daily   chlorhexidine gluconate (MEDLINE KIT)  15 mL Mouth Rinse BID   Chlorhexidine Gluconate Cloth  6 each Topical Daily   docusate  100 mg Per Tube BID   famotidine  10 mg Per Tube Daily   feeding supplement (PROSource TF)  45 mL Per Tube TID   insulin aspart  0-15 Units Subcutaneous Q4H   insulin glargine-yfgn  10 Units Subcutaneous Daily   mouth rinse  15 mL Mouth Rinse 10 times per day   polyethylene glycol  17 g Per Tube Daily   sodium chloride flush  10-40 mL Intracatheter Q12H   sodium chloride flush  3 mL Intravenous Q12H   alteplase, fentaNYL (SUBLIMAZE) injection, fentaNYL (SUBLIMAZE) injection, heparin, midazolam, midazolam, sodium chloride, sodium chloride flush  Assessment/ Plan:  Dialysis Orders: [facility] MWF 4HR 15MIN, 3K,   2.ca , EDW 70.0 kg just lowered half kilogram  Right IJ PC ,LUA AVGG inserted 7/22/220 Mircera 60 MCG on 03/19/21 2000 units heparin heparin Calcitriol 1.75 MCG   Assessment/Plan Non-STEMI= plan per admit/cardiology consulted.  She was scheduled for cardiac catheterization but was having chest pain and refused. 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  CT angio shows multiple areas of plaque.  Continues on IV anticoagulation ESRD -HD MWF patient underwent dialysis 03/16/2021.  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

## 2021-04-13 NOTE — Progress Notes (Signed)
ANTICOAGULATION CONSULT NOTE  Pharmacy Consult for heparin Indication: chest pain/ACS/stroke   Allergies  Allergen Reactions   Ace Inhibitors Cough    Patient Measurements: Height: '5\' 7"'$  (170.2 cm) Weight: 68.1 kg (150 lb 2.1 oz) IBW/kg (Calculated) : 61.6 Heparin Dosing Weight: 68 kg   Vital Signs: Temp: 94.3 F (34.6 C) (09/03 2322) Temp Source: Axillary (09/03 2322) BP: 113/72 (09/04 0530) Pulse Rate: 52 (09/04 0530)  Labs: Recent Labs    04/11/21 0430 04/11/21 0430 04/11/21 1632 04/12/21 0353 04/12/21 0500 04/12/21 1419 04/12/21 1527 04/12/21 2123 04/13/21 0500  HGB 10.5*  --   --  10.6* 12.2  --   --   --  11.2*  HCT 32.1*  --   --  32.5* 36.0  --   --   --  34.6*  PLT 250  --   --  245  --   --   --   --  220  LABPROT 32.0*  --   --  26.9*  --   --   --   --  25.6*  INR 3.1*  --   --  2.5*  --   --   --   --  2.3*  HEPARINUNFRC  --    < >  --  <0.10*  --  0.36  --  0.50 0.63  CREATININE 1.68*  --  1.49* 1.23*  --   --  1.02*  --   --    < > = values in this interval not displayed.     Estimated Creatinine Clearance: 53.5 mL/min (A) (by C-G formula based on SCr of 1.02 mg/dL (H)).   Assessment: 70 YOF who presents with weakness and decreased appetite. Troponin elevated (peaked at 14,294) concerning for NSTEMI. Of note, patient is on warfarin at home for h/o Afib. 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 INR trending down (likely not reflective of hemorrhagic risk given liver dysfunction).   Heparin level 0.63 (supratherapeutic) for lower goal on 1000 units/hr. No bleeding noted.   Goal of Therapy:  Heparin level 0.3 to 0.5 units/ml Monitor platelets by anticoagulation protocol: Yes   Plan:  Decrease heparin infusion to 90 units/hr.  F/u 8 hr heparin level  Sherlon Handing, PharmD, BCPS Please see amion for complete clinical pharmacist phone list 04/13/2021 6:11 AM

## 2021-04-13 NOTE — Progress Notes (Signed)
ANTICOAGULATION CONSULT NOTE  Pharmacy Consult for heparin Indication: chest pain/ACS/stroke   Allergies  Allergen Reactions   Ace Inhibitors Cough    Patient Measurements: Height: '5\' 7"'$  (170.2 cm) Weight: 67.8 kg (149 lb 7.6 oz) IBW/kg (Calculated) : 61.6 Heparin Dosing Weight: 68 kg   Vital Signs: Temp: 96.6 F (35.9 C) (09/04 1911) Temp Source: Axillary (09/04 1911) BP: 130/81 (09/04 2245) Pulse Rate: 87 (09/04 2245)  Labs: Recent Labs    04/11/21 0430 04/11/21 1632 04/12/21 0353 04/12/21 0500 04/12/21 1419 04/12/21 1527 04/12/21 2123 04/13/21 0500 04/13/21 1344 04/13/21 1559 04/13/21 2136 04/13/21 2231  HGB 10.5*  --  10.6* 12.2  --   --   --  11.2*  --   --   --   --   HCT 32.1*  --  32.5* 36.0  --   --   --  34.6*  --   --   --   --   PLT 250  --  245  --   --   --   --  220  --   --   --   --   LABPROT 32.0*  --  26.9*  --   --   --   --  25.6*  --   --   --   --   INR 3.1*  --  2.5*  --   --   --   --  2.3*  --   --   --   --   HEPARINUNFRC  --   --  <0.10*  --    < >  --    < > 0.63 0.62  --  <0.10* 0.49  CREATININE 1.68*   < > 1.23*  --   --  1.02*  --  0.94  --  0.98  --   --    < > = values in this interval not displayed.     Estimated Creatinine Clearance: 55.7 mL/min (by C-G formula based on SCr of 0.98 mg/dL).   Assessment: 29 YOF who presents with weakness and decreased appetite. Troponin elevated (peaked at 14,294) concerning for NSTEMI. Of note, patient is on warfarin at home for h/o Afib. 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 INR trending down (likely not reflective of hemorrhagic risk given liver dysfunction).   Heparin level undetectable but upon recheck is 0.49 (therapeutic) so first draw must have been error. No bleeding noted.  Goal of Therapy:   Heparin level 0.3 to 0.5 units/ml Monitor platelets by anticoagulation protocol: Yes   Plan:  Continue heparin infusion at 800 units/hr.  Daily heparin level and CBC  Sherlon Handing, PharmD, BCPS Please see amion for complete clinical pharmacist phone list 04/13/2021 11:24 PM

## 2021-04-13 NOTE — Progress Notes (Addendum)
ANTICOAGULATION CONSULT NOTE  Pharmacy Consult for heparin Indication: chest pain/ACS/stroke   Allergies  Allergen Reactions   Ace Inhibitors Cough    Patient Measurements: Height: '5\' 7"'$  (170.2 cm) Weight: 67.8 kg (149 lb 7.6 oz) IBW/kg (Calculated) : 61.6 Heparin Dosing Weight: 68 kg   Vital Signs: Temp: 96.9 F (36.1 C) (09/04 1154) Temp Source: Axillary (09/04 1154) BP: 131/72 (09/04 1400) Pulse Rate: 60 (09/04 1400)  Labs: Recent Labs    04/11/21 0430 04/11/21 1632 04/12/21 0353 04/12/21 0500 04/12/21 1419 04/12/21 1527 04/12/21 2123 04/13/21 0500 04/13/21 1344  HGB 10.5*  --  10.6* 12.2  --   --   --  11.2*  --   HCT 32.1*  --  32.5* 36.0  --   --   --  34.6*  --   PLT 250  --  245  --   --   --   --  220  --   LABPROT 32.0*  --  26.9*  --   --   --   --  25.6*  --   INR 3.1*  --  2.5*  --   --   --   --  2.3*  --   HEPARINUNFRC  --   --  <0.10*  --    < >  --  0.50 0.63 0.62  CREATININE 1.68*   < > 1.23*  --   --  1.02*  --  0.94  --    < > = values in this interval not displayed.    Estimated Creatinine Clearance: 58 mL/min (by C-G formula based on SCr of 0.94 mg/dL).   Assessment: 22 YOF who presents with weakness and decreased appetite. Troponin elevated (peaked at 14,294) concerning for NSTEMI. Of note, patient is on warfarin at home for h/o Afib. 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 INR trending down (likely not reflective of hemorrhagic risk given liver dysfunction).   Heparin level remains supra-therapeutic at 0.62 for lower goal on 900 units/hr. No bleeding noted.   Goal of Therapy:  Heparin level 0.3 to 0.5 units/ml Monitor platelets by anticoagulation protocol: Yes   Plan:  Decrease heparin infusion to 800 units/hr.  F/u 6 hr heparin level  Sloan Leiter, PharmD, BCPS, BCCCP Clinical Pharmacist Please refer to [facility] for [place] numbers 04/13/2021 2:25 PM

## 2021-04-14 ENCOUNTER — Inpatient Hospital Stay (HOSPITAL_COMMUNITY): Payer: Medicare Other

## 2021-04-14 DIAGNOSIS — I6340 Cerebral infarction due to embolism of unspecified cerebral artery: Secondary | ICD-10-CM

## 2021-04-14 DIAGNOSIS — I214 Non-ST elevation (NSTEMI) myocardial infarction: Secondary | ICD-10-CM | POA: Diagnosis not present

## 2021-04-14 LAB — COMPREHENSIVE METABOLIC PANEL
ALT: 1055 U/L — ABNORMAL HIGH (ref 0–44)
ALT: 761 U/L — ABNORMAL HIGH (ref 0–44)
AST: 878 U/L — ABNORMAL HIGH (ref 15–41)
AST: 838 U/L — ABNORMAL HIGH (ref 15–41)
Albumin: 2.4 g/dL — ABNORMAL LOW (ref 3.5–5.0)
Albumin: 1.9 g/dL — ABNORMAL LOW (ref 3.5–5.0)
Alkaline Phosphatase: 242 U/L — ABNORMAL HIGH (ref 38–126)
Alkaline Phosphatase: 223 U/L — ABNORMAL HIGH (ref 38–126)
Anion gap: 13 (ref 5–15)
Anion gap: 23 — ABNORMAL HIGH (ref 5–15)
BUN: 16 mg/dL (ref 8–23)
BUN: 18 mg/dL (ref 8–23)
CO2: 20 mmol/L — ABNORMAL LOW (ref 22–32)
CO2: 25 mmol/L (ref 22–32)
Calcium: 9.3 mg/dL (ref 8.9–10.3)
Calcium: 7.9 mg/dL — ABNORMAL LOW (ref 8.9–10.3)
Chloride: 101 mmol/L (ref 98–111)
Chloride: 99 mmol/L (ref 98–111)
Creatinine, Ser: 1.02 mg/dL — ABNORMAL HIGH (ref 0.44–1.00)
Creatinine, Ser: 1.21 mg/dL — ABNORMAL HIGH (ref 0.44–1.00)
GFR, Estimated: >60 mL/min (ref >60)
GFR, Estimated: 50 mL/min — ABNORMAL LOW (ref >60)
Glucose, Bld: 240 mg/dL — ABNORMAL HIGH (ref 70–99)
Glucose, Bld: 122 mg/dL — ABNORMAL HIGH (ref 70–99)
Potassium: 4.9 mmol/L (ref 3.5–5.1)
Potassium: 6.1 mmol/L — ABNORMAL HIGH (ref 3.5–5.1)
Sodium: 134 mmol/L — ABNORMAL LOW (ref 135–145)
Sodium: 147 mmol/L — ABNORMAL HIGH (ref 135–145)
Total Bilirubin: 2.1 mg/dL — ABNORMAL HIGH (ref 0.3–1.2)
Total Bilirubin: 1.8 mg/dL — ABNORMAL HIGH (ref 0.3–1.2)
Total Protein: 6.4 g/dL — ABNORMAL LOW (ref 6.5–8.1)
Total Protein: 5.1 g/dL — ABNORMAL LOW (ref 6.5–8.1)

## 2021-04-14 LAB — POCT I-STAT EG7
Acid-Base Excess: 4 mmol/L — ABNORMAL HIGH (ref 0.0–2.0)
Bicarbonate: 29.3 mmol/L — ABNORMAL HIGH (ref 20.0–28.0)
Calcium, Ion: 0.88 mmol/L — CL (ref 1.15–1.40)
HCT: 26 % — ABNORMAL LOW (ref 36.0–46.0)
Hemoglobin: 8.8 g/dL — ABNORMAL LOW (ref 12.0–15.0)
O2 Saturation: 57 %
Patient temperature: 95.6
Potassium: 4.9 mmol/L (ref 3.5–5.1)
Sodium: 149 mmol/L — ABNORMAL HIGH (ref 135–145)
TCO2: 31 mmol/L (ref 22–32)
pCO2, Ven: 42 mmHg — ABNORMAL LOW (ref 44.0–60.0)
pH, Ven: 7.444 — ABNORMAL HIGH (ref 7.250–7.430)
pO2, Ven: 26 mmHg — CL (ref 32.0–45.0)

## 2021-04-14 LAB — GLUCOSE, CAPILLARY
Glucose-Capillary: 231 mg/dL — ABNORMAL HIGH (ref 70–99)
Glucose-Capillary: 94 mg/dL (ref 70–99)
Glucose-Capillary: 75 mg/dL (ref 70–99)
Glucose-Capillary: 119 mg/dL — ABNORMAL HIGH (ref 70–99)

## 2021-04-14 LAB — RENAL FUNCTION PANEL
Albumin: 2.4 g/dL — ABNORMAL LOW (ref 3.5–5.0)
Anion gap: 15 (ref 5–15)
BUN: 20 mg/dL (ref 8–23)
CO2: 17 mmol/L — ABNORMAL LOW (ref 22–32)
Calcium: 9 mg/dL (ref 8.9–10.3)
Chloride: 102 mmol/L (ref 98–111)
Creatinine, Ser: 1.19 mg/dL — ABNORMAL HIGH (ref 0.44–1.00)
GFR, Estimated: 51 mL/min — ABNORMAL LOW (ref >60)
Glucose, Bld: 150 mg/dL — ABNORMAL HIGH (ref 70–99)
Phosphorus: 4 mg/dL (ref 2.5–4.6)
Potassium: 5.3 mmol/L — ABNORMAL HIGH (ref 3.5–5.1)
Sodium: 134 mmol/L — ABNORMAL LOW (ref 135–145)

## 2021-04-14 LAB — CBC WITH DIFFERENTIAL/PLATELET
Abs Immature Granulocytes: 10.86 10*3/uL — ABNORMAL HIGH (ref 0.00–0.07)
Basophils Absolute: 1.9 10*3/uL — ABNORMAL HIGH (ref 0.0–0.1)
Basophils Relative: 4 %
Eosinophils Absolute: 0.2 10*3/uL (ref 0.0–0.5)
Eosinophils Relative: 0 %
HCT: 31.5 % — ABNORMAL LOW (ref 36.0–46.0)
Hemoglobin: 10 g/dL — ABNORMAL LOW (ref 12.0–15.0)
Immature Granulocytes: 24 %
Lymphocytes Relative: 16 %
Lymphs Abs: 7.1 10*3/uL — ABNORMAL HIGH (ref 0.7–4.0)
MCH: 27.3 pg (ref 26.0–34.0)
MCHC: 31.7 g/dL (ref 30.0–36.0)
MCV: 86.1 fL (ref 80.0–100.0)
Monocytes Absolute: 5 10*3/uL — ABNORMAL HIGH (ref 0.1–1.0)
Monocytes Relative: 11 %
Neutro Abs: 19.7 10*3/uL — ABNORMAL HIGH (ref 1.7–7.7)
Neutrophils Relative %: 45 %
Platelets: 159 10*3/uL (ref 150–400)
RBC: 3.66 MIL/uL — ABNORMAL LOW (ref 3.87–5.11)
RDW: 19.1 % — ABNORMAL HIGH (ref 11.5–15.5)
WBC: 44.7 10*3/uL — ABNORMAL HIGH (ref 4.0–10.5)
nRBC: 63.9 % — ABNORMAL HIGH (ref 0.0–0.2)

## 2021-04-14 LAB — PHOSPHORUS: Phosphorus: 5 mg/dL — ABNORMAL HIGH (ref 2.5–4.6)

## 2021-04-14 LAB — CBC
HCT: 35.7 % — ABNORMAL LOW (ref 36.0–46.0)
Hemoglobin: 11.6 g/dL — ABNORMAL LOW (ref 12.0–15.0)
MCH: 27.7 pg (ref 26.0–34.0)
MCHC: 32.5 g/dL (ref 30.0–36.0)
MCV: 85.2 fL (ref 80.0–100.0)
Platelets: 174 10*3/uL (ref 150–400)
RBC: 4.19 MIL/uL (ref 3.87–5.11)
RDW: 19.8 % — ABNORMAL HIGH (ref 11.5–15.5)
WBC: 30.8 10*3/uL — ABNORMAL HIGH (ref 4.0–10.5)
nRBC: 47.1 % — ABNORMAL HIGH (ref 0.0–0.2)

## 2021-04-14 LAB — COOXEMETRY PANEL
Carboxyhemoglobin: 0.7 % (ref 0.5–1.5)
Methemoglobin: 1.1 % (ref 0.0–1.5)
O2 Saturation: 47.2 %
Total hemoglobin: 11.6 g/dL — ABNORMAL LOW (ref 12.0–16.0)

## 2021-04-14 LAB — HEPARIN LEVEL (UNFRACTIONATED): Heparin Unfractionated: 0.48 IU/mL (ref 0.30–0.70)

## 2021-04-14 LAB — MAGNESIUM
Magnesium: 2.8 mg/dL — ABNORMAL HIGH (ref 1.7–2.4)
Magnesium: 2.7 mg/dL — ABNORMAL HIGH (ref 1.7–2.4)

## 2021-04-14 MED ORDER — SODIUM CHLORIDE 0.9 % IV SOLN
2.0000 g | Freq: Two times a day (BID) | INTRAVENOUS | Status: DC
  Administered 2021-04-14: 2 g via INTRAVENOUS
  Filled 2021-04-14: qty 2

## 2021-04-14 MED ORDER — ATROPINE SULFATE 1 MG/10ML IJ SOSY: 0.4000 mg | PREFILLED_SYRINGE | INTRAMUSCULAR | Status: DC | PRN

## 2021-04-14 MED ORDER — SODIUM CHLORIDE 0.9 % IV SOLN
1.0000 g | Freq: Once | INTRAVENOUS | Status: AC
  Administered 2021-04-15: 1 g via INTRAVENOUS
  Filled 2021-04-14: qty 10

## 2021-04-14 MED ORDER — SODIUM ZIRCONIUM CYCLOSILICATE 10 G PO PACK
10.0000 g | PACK | Freq: Once | ORAL | Status: AC
  Administered 2021-04-14: 10 g
  Filled 2021-04-14: qty 1

## 2021-04-14 MED ORDER — SODIUM CHLORIDE 0.9 % IV SOLN
2.0000 g | Freq: Three times a day (TID) | INTRAVENOUS | Status: DC
  Administered 2021-04-14 – 2021-04-15 (×2): 2 g via INTRAVENOUS
  Filled 2021-04-14 (×3): qty 2

## 2021-04-14 MED ORDER — SODIUM BICARBONATE 8.4 % IV SOLN
50.0000 meq | Freq: Once | INTRAVENOUS | Status: AC
  Administered 2021-04-14: 50 meq via INTRAVENOUS
  Filled 2021-04-14: qty 50

## 2021-04-14 MED ORDER — DOPAMINE-DEXTROSE 3.2-5 MG/ML-% IV SOLN
0.0000 ug/kg/min | INTRAVENOUS | Status: DC
  Administered 2021-04-14: 2 ug/kg/min via INTRAVENOUS

## 2021-04-14 NOTE — Progress Notes (Addendum)
   04/14/21 2233  Clinical Encounter Type  Visited With Patient not available  Visit Type Code  Referral From Nurse  Consult/Referral To Chaplain   Chaplain responded. The patient is being attended to by the medical team. There are no support person currently here. Chaplain remains available for follow-up spiritual/emotional support as needed. This note was prepared by Jeanine Luz, M.Div..  For questions please contact by phone [phone].

## 2021-04-14 NOTE — Progress Notes (Signed)
STROKE TEAM PROGRESS NOTE   INTERVAL HISTORY RN is at the bedside. Pt still intubated, less responsive than yesterday, less corneal reflexes.  Continue to have hypotension and hypothermia.  Still on heparin IV and 2 pressors.  Recommend palliative care consult.   Vitals:   04/14/21 0930 04/14/21 0945 04/14/21 1000 04/14/21 1015  BP: 115/60 (!) 86/64 (!) 88/69 97/75  Pulse: (!) 48 (!) 50 (!) 56 (!) 52  Resp: (!) 23 (!) 26 (!) 24 (!) 25  Temp:      TempSrc:      SpO2: 98% 98% 99% 99%  Weight:      Height:       CBC:  Recent Labs  Lab 04/10/21 0500 04/11/21 0430 04/12/21 0353 04/12/21 0500 04/13/21 0500 04/14/21 0318  WBC 18.3*   < > 26.8*  --  31.5* 30.8*  NEUTROABS 16.5*  --  24.7*  --   --   --   HGB 10.0*   < > 10.6*   < > 11.2* 11.6*  HCT 29.9*   < > 32.5*   < > 34.6* 35.7*  MCV 82.4   < > 84.0  --  85.0 85.2  PLT 246   < > 245  --  220 174   < > = values in this interval not displayed.   Basic Metabolic Panel:  Recent Labs  Lab 04/12/21 1527 04/13/21 0500 04/13/21 1559 04/14/21 0318  NA 136 136 134* 134*  K 4.3 4.5 4.8 4.9  CL 103 104 101 101  CO2 23 20* 22 20*  GLUCOSE 277* 190* 371* 240*  BUN '12 12 16 16  '$ CREATININE 1.02* 0.94 0.98 1.02*  CALCIUM 8.9 9.0 8.9 9.3  MG  --  2.6*  --  2.8*  PHOS 1.5*  --  1.6*  --     Lipid Panel:  Recent Labs  Lab 03/31/2021 0454  CHOL 165  TRIG 62  HDL 63  CHOLHDL 2.6  VLDL 12  LDLCALC 90    HgbA1c:  Recent Labs  Lab 03/28/2021 0454  HGBA1C 7.0*   Urine Drug Screen: No results for input(s): LABOPIA, COCAINSCRNUR, LABBENZ, AMPHETMU, THCU, LABBARB in the last 168 hours.  Alcohol Level No results for input(s): ETH in the last 168 hours.  IMAGING past 24 hours No results found.  PHYSICAL EXAM  Temp:  [94.5 F (34.7 C)-98 F (36.7 C)] 95.2 F (35.1 C) (09/05 0728) Pulse Rate:  [38-103] 52 (09/05 1015) Resp:  [13-30] 25 (09/05 1015) BP: (73-179)/(50-87) 97/75 (09/05 1015) SpO2:  [94 %-100 %] 99 % (09/05  1015) FiO2 (%):  [40 %] 40 % (09/05 0800) Weight:  [66.1 kg] 66.1 kg (09/05 0500)  General - Well nourished, well developed, intubated not on sedation.  Ophthalmologic - fundi not visualized due to noncooperation.  Cardiovascular - regular rhythm and rate, not on afib today  Neuro - intubated not on sedation, eyes closed, not open on voice, not following commands. With forced eye opening, eyes in mid position, not blinking to visual threat, doll's eyes absent, not tracking, pupil right 3->72m, left 2->253m right pupillary reflex present, left pupillary reflex lost. Corneal reflex absent bilaterally, gag and cough weakly present. Breathing over the vent.  Facial symmetry not able to test due to ET tube.  Tongue protrusion not cooperative. On pain stimulation, no movement of extremities except RUE flicker withdraw. DTR diminished and no babinski. Sensation, coordination and gait not tested.    ASSESSMENT/PLAN Amber Young  a 65 y.o. female with history of atrial fibrillation on coumadin, chronic combined systolic and diastolic CHF with EF of [id number], ESRD on HD M,W,F, CAD s/p PCI to LAD in 2017, RCA 2019, hyperlipidemia, essential hypertension, stroke without residual deficits, type 2 diabetes mellitus, and a recent AV graft placement of the left arm on 8/25, who presented to the ED 04/03/2021 for evaluation of 2 days of malaise, weakness, fatigue, and decreased appetite and was found to have an NSTEMI.   She was to have a cardiac cath and anticoagulation was discontinued and she was placed on heparin drip. The cath did not happen because developed significantly altered mentation. She was seen staring into space and had left arm twitching concerning for seizure activity.   Stroke:  left ACA scattered and left cerebellum infarcts, embolic pattern, secondary to AF with subtherapeutic INR/on and off AC vs. NSTEMI vs. cardiomyopathy CT head: Small infarcts within the bilateral cerebellar  hemispheres are new as compared to the brain MRI of 06/17/2019, but otherwise age-indeterminate. Re-demonstrated chronic lacunar infarcts within bilateral basal ganglia. Re-demonstrated chronic lacunar infarct within the right thalamus.  MRI : Multiple small acute/subacute infarcts in the left ACA and left PICA territories, suggesting embolic events.  CTA Head - Occlusion of the left A1 ACA and multifocal severe stenosis of the left A2 and A3 ACA. Severe stenosis of the right A3 ACA. Moderate right and severe left paraclinoid ICA stenosis.  Rounded filling defect in the left paraclinoid ICA could represent atherosclerotic plaque and/or intraluminal thrombus. Multifocal severe bilateral P2 PCA stenosis. Multifocal severe stenosis of a left M2 MCA branch and multifocal mild to moderate right M2 MCA stenosis. Moderate stenosis of bilateral proximal intradural vertebral arteries.  CTA Neck - Approximately 60-70% stenosis of the left common carotid artery.  2D Echo - LVEF 25-30%, LA dilated, lipotamous interatrial septum.  LDL 90 HgbA1c 7.0 VTE prophylaxis - Heparin IV warfarin and ASA 81 daily prior to admission, now on heparin IV.   Therapy recommendations: pending Disposition: Poor prognosis, recommend palliative care consult for [place] discussion  Chronic A. Fib On Coumadin prior to admission Initial INR 1.2, subtherapeutic On and off Coumadin for surgery/procedure Now on heparin IV Cardiology on board  Seizure staring into space and had left arm twitching EEG periodic discharges on the ictal -interictal continuum, as well as moderate diffuse encephalopathy, but no seizures seen.  loaded with keppra 1.5gm and continued on keppra '750mg'$  BID.  NSTEMI Cardiomyopathy CHF 2D echo showed EF 25 to 30% Cardiology on board On heparin IV  Shock liver Significantly elevated AST/ALT on presentation AST/ALT steady trending down INR 1.2->...->2.5->2.3 - most likely falsely elevated d/t liver  disease. Monitor AST/ALT  Leukocytosis WBC 20.0-> 26.8->31.5->30.8 1/2 positive blood culture On Rocephin->cefepime CCM on board  Respiratory failure Intubated on ventilation Not a candidate for extubation due to mental status CCM on board Recommend palliative care consult for [place] discussion given poor prognosis  History of hypertension Hypotension Home meds:  amlodipine, coreg, hydralazine, Imdur  Unstable On Levophed and vasopressin Long-term BP goal normotensive  Hyperlipidemia Home meds:  lipitor 40 LDL 90, goal < 70 Statin not resumed in hospital d/t liver failure May consider statin once liver function normalizes  Diabetes type II Controlled Home meds:  glucotrol, insulin HgbA1c 7.0, goal < 7.0 Hyperglycemia CBGs SSI On insulin  Dysphagia N.p.o. TF @ 50cc  Other Stroke Risk Factors  Advanced Age >/= 34   Hx stroke/TIA  Family hx stroke  Coronary artery disease ESRD on HD / CRRT, Cre 1.23->0.94->1.02  Other Active Problems Hypothermia - on warming blanket   Hospital day # 7  This patient is critically ill due to stroke, seizure, non-STEMI, shock liver, CHF, ESRD on CRRT, respiratory failure and at significant risk of neurological worsening, death form recurrent stroke, status epilepticus, renal failure, heart failure, sepsis. This patient's care requires constant monitoring of vital signs, hemodynamics, respiratory and cardiac monitoring, review of multiple databases, neurological assessment, discussion with family, other specialists and medical decision making of high complexity. I spent 75mnutes of neurocritical care time in the care of this patient.  JRosalin Hawking MD PhD Stroke Neurology 04/14/2021 10:51 AM    To contact Stroke Continuity provider, please refer to [email or web address] After hours, contact General Neurology

## 2021-04-14 NOTE — Progress Notes (Signed)
2245 Patient brady down to 36. Atropin IV given per order, unresolved. Code blue called overhead. ACLS initiated. See code blue documentation.   Mick Sell RN

## 2021-04-14 NOTE — Progress Notes (Addendum)
[facility] ROUNDING NOTE   Subjective:   Interval History: This is a 65 year old lady end-stage renal disease secondary to diabetes.  She has a history of atrial fibrillation and congestive heart failure with systolic dysfunction ejection fraction 25 to 30%.  She has a history of coronary artery disease and status post PTCA.  She is now admitted with an NSTEMI.  Her usual dialysis days are Monday Wednesday Friday.     She has a postop hematoma from recent left AV graft placement.  04/03/2021.  Patient underwent dialysis 03/19/2021.  He did not receive a cardiac catheterization due to pain and discomfort refusing procedure.  She developed acute dyspnea 04/09/2021 and was transferred to the intensive care unit for mechanical ventilation.  She started CRRT 04/09/2021. MRI scan showed left ACA and left PICA embolic infarcts.  CT angio head and neck showed for multifocal multivessel extra and intracranial stenosis.  Blood pressure 113/72 pulse 55 temperature 95.9 O2 sats 90% FiO2 40%  On vent sedated, no hx obatined  IV levo  IV vasopressin      Objective:  Vital signs in last 24 hours:  Temp:  [94.5 F (34.7 C)-98 F (36.7 C)] 96.2 F (35.7 C) (09/05 1119) Pulse Rate:  [38-103] 56 (09/05 1245) Resp:  [15-30] 20 (09/05 1300) BP: (73-179)/(50-108) 128/108 (09/05 1245) SpO2:  [94 %-100 %] 99 % (09/05 1245) FiO2 (%):  [40 %] 40 % (09/05 1200) Weight:  [66.1 kg] 66.1 kg (09/05 0500)  Weight change: -1.7 kg Filed Weights   04/12/21 0425 04/13/21 0500 04/14/21 0500  Weight: 68.1 kg 67.8 kg 66.1 kg    Intake/Output: I/O last 3 completed shifts: In: 4035.2 [I.V.:1363.9; Other:250; NG/GT:1960; IV Piggyback:461.4] Out: 6712 [Emesis/NG output:90; WPYKD:9833]   Intake/Output this shift:  Total I/O In: 719.9 [I.V.:211.3; NG/GT:300; IV Piggyback:208.6] Out: 825 [Other:785]  Patient intubated CVS- RRR mechanically supported breath sounds RS- CTA ABD- BS present hypoactive  bowel sounds EXT- no edema    OP HD: [facility] MWF  4h 27mn   3K/ 2.ca bath  70kg  RIJ TDC/ LUA AVG 02/28/21  Hep 2000  - mircera 60 ug on 8/10  - calcitriol 1.75 ug tiw   Assessment/Plan Shock - mixed cardiogenic/ hepatic/ septic. Per CCM/ cardiology is getting on pressor support, poor candidate for inotropes. May be heading towards hospice care per cardiology.  AMS - off and on for 4 months, +liver failure. MRI scan here showed left ACA and left PICA embolic infarcts.  CT angio shows multiple areas of plaque.  Continues on IV anticoagulation. Shock contributing ESRD -HD MWF. Had iHD on 03/19/2021.  CRRT started 8/31  Non-STEMI - not primary issue per cards. 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 - binders if off vent and eating Type 2 diabetes mellitus= per admit team Nutrition -when eating carb modified/renal with fluid restriction    RKelly Splinter MD 04/14/2021, 1:25 PM     Basic Metabolic Panel: Recent Labs  Lab 04/10/21 0500 04/10/21 1830 04/11/21 0430 04/11/21 1632 04/12/21 0353 04/12/21 0500 04/12/21 1527 04/13/21 0500 04/13/21 1559 04/14/21 0318  NA 137 138 133* 136 137 139 136 136 134* 134*  K 4.0 3.3* 3.8 3.7 3.9 3.9 4.3 4.5 4.8 4.9  CL 100 100 100 103 104  --  103 104 101 101  CO2 21* 22 21* 21* 23  --  23 20* 22 20*  GLUCOSE 99 228* 223* 223* 233*  --  277* 190* 371* 240*  BUN 39* _0 --  _1 CREATININE 4.72* 2.24* 1.68* 1.49* 1.23*  --  1.02* 0.94 0.98 1.02*  CALCIUM 8.8* 8.4* 8.7* 8.7* 8.8*  --  8.9 9.0 8.9 9.3  MG 2.6*  --  2.6*  --  2.6*  --   --  2.6*  --  2.8*  PHOS 5.2*  5.2* 3.1  --  2.2*  --   --  1.5*  --  1.6*  --      Liver Function Tests: Recent Labs  Lab 04/10/21 0500 04/10/21 1830 04/11/21 0430 04/11/21 1632 04/12/21 0353 04/12/21 1527 04/13/21 0500 04/13/21 1559 04/14/21 0318  AST 9,649*  --  4,440*   --  2,240*  --  1,304*  --  878*  ALT 2,575*  --  1,923*  --  1,440*  --  1,305*  --  1,055*  ALKPHOS 119  --  151*  --  203*  --  216*  --  242*  BILITOT 2.2*  --  2.3*  --  2.0*  --  1.8*  --  2.1*  PROT 6.2*  --  6.1*  --  5.9*  --  6.3*  --  6.4*  ALBUMIN 3.0*  2.9*   < > 2.5*   < > 2.4* 2.4* 2.4* 2.4* 2.4*   < > = values in this interval not displayed.    No results for input(s): LIPASE, AMYLASE in the last 168 hours.  Recent Labs  Lab 03/19/2021 1552 04/10/21 1442 04/12/21 0350  AMMONIA 68* 47* 26     CBC: Recent Labs  Lab 04/03/2021 0454 04/09/21 0248 04/10/21 0500 04/11/21 0430 04/12/21 0353 04/12/21 0500 04/13/21 0500 04/14/21 0318  WBC 8.5   < > 18.3* 20.0* 26.8*  --  31.5* 30.8*  NEUTROABS 6.3  --  16.5*  --  24.7*  --   --   --   HGB 9.6*   < > 10.0* 10.5* 10.6* 12.2 11.2* 11.6*  HCT 30.0*   < > 29.9* 32.1* 32.5* 36.0 34.6* 35.7*  MCV 84.7   < > 82.4 84.9 84.0  --  85.0 85.2  PLT 256   < > 246 250 245  --  220 174   < > = values in this interval not displayed.    Medications:     prismasol BGK 4/2.5 500 mL/hr at 04/14/21 1104    prismasol BGK 4/2.5 500 mL/hr at 04/14/21 1104   ceFEPime (MAXIPIME) IV     feeding supplement (VITAL 1.5 CAL) 1,000 mL (04/14/21 0600)   heparin 750 Units/hr (04/14/21 1300)   levETIRAcetam Stopped (04/14/21 1114)   norepinephrine (LEVOPHED) Adult infusion 20 mcg/min (04/14/21 1300)   prismasol BGK 4/2.5 2,000 mL/hr at 04/14/21 1259   vasopressin 0.03 Units/min (04/14/21 1300)    B-complex with vitamin C  1 tablet Per Tube Daily   chlorhexidine gluconate (MEDLINE KIT)  15 mL Mouth Rinse BID   Chlorhexidine Gluconate Cloth  6 each Topical Daily   docusate  100 mg Per Tube BID   famotidine  10 mg Per Tube Daily   feeding supplement (PROSource TF)  45 mL Per Tube TID   insulin aspart  0-15 Units Subcutaneous Q4H   insulin glargine-yfgn  10 Units Subcutaneous Daily   mouth rinse  15 mL Mouth Rinse 10 times per day    polyethylene glycol  17 g Per Tube Daily   sodium chloride flush  10-40 mL Intracatheter Q12H   sodium chloride flush  3 mL Intravenous Q12H   alteplase, fentaNYL (SUBLIMAZE) injection, fentaNYL (SUBLIMAZE) injection, heparin, midazolam, midazolam, sodium chloride, sodium chloride flush

## 2021-04-14 NOTE — Plan of Care (Signed)
Problem: Education: Goal: Knowledge of General Education information will improve Description: Including pain rating scale, medication(s)/side effects and non-pharmacologic comfort measures 04/14/2021 0926 by Luan Moore, RN Outcome: Not Progressing 04/14/2021 0926 by Luan Moore, RN Outcome: Not Progressing   Problem: Health Behavior/Discharge Planning: Goal: Ability to manage health-related needs will improve 04/14/2021 0926 by Luan Moore, RN Outcome: Not Progressing 04/14/2021 0926 by Luan Moore, RN Outcome: Not Progressing   Problem: Clinical Measurements: Goal: Ability to maintain clinical measurements within normal limits will improve 04/14/2021 0926 by Luan Moore, RN Outcome: Not Progressing 04/14/2021 0926 by Luan Moore, RN Outcome: Not Progressing Goal: Will remain free from infection 04/14/2021 0926 by Luan Moore, RN Outcome: Not Progressing 04/14/2021 0926 by Luan Moore, RN Outcome: Not Progressing Goal: Diagnostic test results will improve 04/14/2021 0926 by Luan Moore, RN Outcome: Not Progressing 04/14/2021 0926 by Luan Moore, RN Outcome: Not Progressing Goal: Respiratory complications will improve 04/14/2021 0926 by Luan Moore, RN Outcome: Not Progressing 04/14/2021 0926 by Luan Moore, RN Outcome: Not Progressing Goal: Cardiovascular complication will be avoided 04/14/2021 0926 by Luan Moore, RN Outcome: Not Progressing 04/14/2021 0926 by Luan Moore, RN Outcome: Not Progressing   Problem: Activity: Goal: Risk for activity intolerance will decrease 04/14/2021 0926 by Luan Moore, RN Outcome: Not Progressing 04/14/2021 0926 by Luan Moore, RN Outcome: Not Progressing   Problem: Nutrition: Goal: Adequate nutrition will be maintained 04/14/2021 0926 by Luan Moore, RN Outcome: Not Progressing 04/14/2021 0926 by Luan Moore, RN Outcome: Not  Progressing   Problem: Coping: Goal: Level of anxiety will decrease 04/14/2021 0926 by Luan Moore, RN Outcome: Not Progressing 04/14/2021 0926 by Luan Moore, RN Outcome: Not Progressing   Problem: Elimination: Goal: Will not experience complications related to bowel motility 04/14/2021 0926 by Luan Moore, RN Outcome: Not Progressing 04/14/2021 0926 by Luan Moore, RN Outcome: Not Progressing Goal: Will not experience complications related to urinary retention 04/14/2021 0926 by Luan Moore, RN Outcome: Not Progressing 04/14/2021 0926 by Luan Moore, RN Outcome: Not Progressing   Problem: Pain Managment: Goal: General experience of comfort will improve 04/14/2021 0926 by Luan Moore, RN Outcome: Not Progressing 04/14/2021 0926 by Luan Moore, RN Outcome: Not Progressing   Problem: Safety: Goal: Ability to remain free from injury will improve 04/14/2021 0926 by Luan Moore, RN Outcome: Not Progressing 04/14/2021 0926 by Luan Moore, RN Outcome: Not Progressing   Problem: Skin Integrity: Goal: Risk for impaired skin integrity will decrease 04/14/2021 0926 by Luan Moore, RN Outcome: Not Progressing 04/14/2021 0926 by Luan Moore, RN Outcome: Not Progressing   Problem: Education: Goal: Knowledge of disease or condition will improve 04/14/2021 0926 by Luan Moore, RN Outcome: Not Progressing 04/14/2021 0926 by Luan Moore, RN Outcome: Not Progressing Goal: Knowledge of secondary prevention will improve 04/14/2021 0926 by Luan Moore, RN Outcome: Not Progressing 04/14/2021 0926 by Luan Moore, RN Outcome: Not Progressing Goal: Knowledge of patient specific risk factors addressed and post discharge goals established will improve 04/14/2021 0926 by Luan Moore, RN Outcome: Not Progressing 04/14/2021 0926 by Luan Moore, RN Outcome: Not Progressing   Problem: Coping: Goal:  Will verbalize positive feelings about self 04/14/2021 0926 by Luan Moore, RN Outcome: Not Progressing 04/14/2021 0926 by Luan Moore, RN Outcome: Not Progressing Goal: Will identify appropriate support needs 04/14/2021 [phone]  by Luan Moore, RN Outcome: Not Progressing 04/14/2021 0926 by Luan Moore, RN Outcome: Not Progressing   Problem: Health Behavior/Discharge Planning: Goal: Ability to manage health-related needs will improve 04/14/2021 0926 by Luan Moore, RN Outcome: Not Progressing 04/14/2021 0926 by Luan Moore, RN Outcome: Not Progressing   Problem: Self-Care: Goal: Ability to participate in self-care as condition permits will improve 04/14/2021 0926 by Luan Moore, RN Outcome: Not Progressing 04/14/2021 0926 by Luan Moore, RN Outcome: Not Progressing Goal: Verbalization of feelings and concerns over difficulty with self-care will improve 04/14/2021 0926 by Luan Moore, RN Outcome: Not Progressing 04/14/2021 0926 by Luan Moore, RN Outcome: Not Progressing Goal: Ability to communicate needs accurately will improve 04/14/2021 0926 by Luan Moore, RN Outcome: Not Progressing 04/14/2021 0926 by Luan Moore, RN Outcome: Not Progressing   Problem: Nutrition: Goal: Risk of aspiration will decrease 04/14/2021 0926 by Luan Moore, RN Outcome: Not Progressing 04/14/2021 0926 by Luan Moore, RN Outcome: Not Progressing Goal: Dietary intake will improve 04/14/2021 0926 by Luan Moore, RN Outcome: Not Progressing 04/14/2021 0926 by Luan Moore, RN Outcome: Not Progressing   Problem: Ischemic Stroke/TIA Tissue Perfusion: Goal: Complications of ischemic stroke/TIA will be minimized 04/14/2021 0926 by Luan Moore, RN Outcome: Not Progressing 04/14/2021 0926 by Luan Moore, RN Outcome: Not Progressing   Problem: Activity: Goal: Ability to tolerate increased activity will  improve 04/14/2021 0926 by Luan Moore, RN Outcome: Not Progressing 04/14/2021 0926 by Luan Moore, RN Outcome: Not Progressing   Problem: Respiratory: Goal: Ability to maintain a clear airway and adequate ventilation will improve 04/14/2021 0926 by Luan Moore, RN Outcome: Not Progressing 04/14/2021 0926 by Luan Moore, RN Outcome: Not Progressing   Problem: Role Relationship: Goal: Method of communication will improve 04/14/2021 0926 by Luan Moore, RN Outcome: Not Progressing 04/14/2021 0926 by Luan Moore, RN Outcome: Not Progressing

## 2021-04-14 NOTE — Progress Notes (Addendum)
While in patient's room speaking with husband, patient noted to become suddenly hypotensive and bradycardic to  HR 29. MD paged. 1 amp epinephrine given, code blue activated and  CPR initiated. See code report for remaining actions.

## 2021-04-14 NOTE — Progress Notes (Signed)
Called emergently to see patient  She had a bradycardic episode and went pulseless  CPR initiated received epinephrine and did go into V. Tach Was shocked  Resuscitated for 11 minutes before she achieved ROSC  Labs ordered  Informed spouse  Patient remains a full code at present

## 2021-04-14 NOTE — Progress Notes (Signed)
Cardiology Progress Note  Patient ID: Amber Young MRN: [id number] DOB: [birth year] Date of Encounter: 04/14/2021  Primary Cardiologist: Skeet Latch, MD  Subjective   Chief Complaint: None/intubated.   HPI: Intubated, not following commands.  Remains in A. fib with RVR.  Co. oximetry suggests mixed cardiogenic and septic shock.  ROS:  All other ROS reviewed and negative. Pertinent positives noted in the HPI.     Inpatient Medications  Scheduled Meds:  B-complex with vitamin C  1 tablet Per Tube Daily   chlorhexidine gluconate (MEDLINE KIT)  15 mL Mouth Rinse BID   Chlorhexidine Gluconate Cloth  6 each Topical Daily   docusate  100 mg Per Tube BID   famotidine  10 mg Per Tube Daily   feeding supplement (PROSource TF)  45 mL Per Tube TID   insulin aspart  0-15 Units Subcutaneous Q4H   insulin glargine-yfgn  10 Units Subcutaneous Daily   mouth rinse  15 mL Mouth Rinse 10 times per day   polyethylene glycol  17 g Per Tube Daily   sodium chloride flush  10-40 mL Intracatheter Q12H   sodium chloride flush  3 mL Intravenous Q12H   Continuous Infusions:   prismasol BGK 4/2.5 500 mL/hr at 04/14/21 0102    prismasol BGK 4/2.5 500 mL/hr at 04/14/21 0102   cefTRIAXone (ROCEPHIN)  IV Stopped (04/13/21 1628)   feeding supplement (VITAL 1.5 CAL) 1,000 mL (04/14/21 0600)   heparin 800 Units/hr (04/14/21 0800)   levETIRAcetam Stopped (04/14/21 0017)   norepinephrine (LEVOPHED) Adult infusion 17 mcg/min (04/14/21 0800)   prismasol BGK 4/2.5 2,000 mL/hr at 04/14/21 0735   vasopressin 0.03 Units/min (04/14/21 0800)   PRN Meds: alteplase, fentaNYL (SUBLIMAZE) injection, fentaNYL (SUBLIMAZE) injection, heparin, midazolam, midazolam, sodium chloride, sodium chloride flush   Vital Signs   Vitals:   04/14/21 0728 04/14/21 0730 04/14/21 0745 04/14/21 0747  BP:  111/73 104/72   Pulse:  (!) 56 (!) 53 81  Resp:  (!) 27 (!) 24 (!) 28  Temp: (!) 95.2 F (35.1 C)     TempSrc: Rectal      SpO2:  99% 98% 94%  Weight:      Height:        Intake/Output Summary (Last 24 hours) at 04/14/2021 0806 Last data filed at 04/14/2021 0800 Gross per 24 hour  Intake 2656.37 ml  Output 3346 ml  Net -689.63 ml   Last 3 Weights 04/14/2021 04/13/2021 04/12/2021  Weight (lbs) 145 lb 11.6 oz 149 lb 7.6 oz 150 lb 2.1 oz  Weight (kg) 66.1 kg 67.8 kg 68.1 kg      Telemetry  Overnight telemetry shows atrial fibrillation heart rate 80s, which I personally reviewed.    Physical Exam   Vitals:   04/14/21 0728 04/14/21 0730 04/14/21 0745 04/14/21 0747  BP:  111/73 104/72   Pulse:  (!) 56 (!) 53 81  Resp:  (!) 27 (!) 24 (!) 28  Temp: (!) 95.2 F (35.1 C)     TempSrc: Rectal     SpO2:  99% 98% 94%  Weight:      Height:        Intake/Output Summary (Last 24 hours) at 04/14/2021 0806 Last data filed at 04/14/2021 0800 Gross per 24 hour  Intake 2656.37 ml  Output 3346 ml  Net -689.63 ml    Last 3 Weights 04/14/2021 04/13/2021 04/12/2021  Weight (lbs) 145 lb 11.6 oz 149 lb 7.6 oz 150 lb 2.1 oz  Weight (kg) 66.1 kg  67.8 kg 68.1 kg    Body mass index is 22.82 kg/m.  General: Intubated Head: Atraumatic, normal size  Eyes: No scleral icterus Neck: Supple, no JVD Endocrine: No thryomegaly Cardiac: Normal S1, S2; irregular rhythm, no murmurs Lungs: Diminished breath sounds bilaterally Abd: Soft, nontender, no hepatomegaly  Ext: No edema, pulses 2+ Musculoskeletal: No deformities Skin: Warm and dry, no rashes   Neuro: Intubated on the vent, not following commands  Labs  High Sensitivity Troponin:   Recent Labs  Lab 03/20/2021 0806 03/20/2021 0930 03/27/2021 1442 04/06/2021 1830 03/23/2021 0454  TROPONINIHS 1,320* 1,605* 2,745* 5,564* 14,294*     Cardiac EnzymesNo results for input(s): TROPONINI in the last 168 hours. No results for input(s): TROPIPOC in the last 168 hours.  Chemistry Recent Labs  Lab 04/12/21 0353 04/12/21 0500 04/13/21 0500 04/13/21 1559 04/14/21 0318  NA 137   < > 136  134* 134*  K 3.9   < > 4.5 4.8 4.9  CL 104   < > 104 101 101  CO2 23   < > 20* 22 20*  GLUCOSE 233*   < > 190* 371* 240*  BUN 14   < > '12 16 16  ' CREATININE 1.23*   < > 0.94 0.98 1.02*  CALCIUM 8.8*   < > 9.0 8.9 9.3  PROT 5.9*  --  6.3*  --  6.4*  ALBUMIN 2.4*   < > 2.4* 2.4* 2.4*  AST 2,240*  --  1,304*  --  878*  ALT 1,440*  --  1,305*  --  1,055*  ALKPHOS 203*  --  216*  --  242*  BILITOT 2.0*  --  1.8*  --  2.1*  GFRNONAA 49*   < > >60 >60 >60  ANIONGAP 10   < > '12 11 13   ' < > = values in this interval not displayed.    Hematology Recent Labs  Lab 04/12/21 0353 04/12/21 0500 04/13/21 0500 04/14/21 0318  WBC 26.8*  --  31.5* 30.8*  RBC 3.87  --  4.07 4.19  HGB 10.6* 12.2 11.2* 11.6*  HCT 32.5* 36.0 34.6* 35.7*  MCV 84.0  --  85.0 85.2  MCH 27.4  --  27.5 27.7  MCHC 32.6  --  32.4 32.5  RDW 17.7*  --  18.7* 19.8*  PLT 245  --  220 174   BNPNo results for input(s): BNP, PROBNP in the last 168 hours.  DDimer No results for input(s): DDIMER in the last 168 hours.   Radiology  DG Chest Port 1 View  Result Date: 04/13/2021 CLINICAL DATA:  Respiratory failure EXAM: PORTABLE CHEST 1 VIEW COMPARISON:  Chest radiograph from one day prior. FINDINGS: Endotracheal tube tip is 4.3 cm above the carina. Insert stat right internal jugular central venous catheter terminates in the middle third of the SVC. Left internal jugular central venous catheter terminates at the cavoatrial junction. Stable cardiomediastinal silhouette with top-normal heart size. No pneumothorax. No pleural effusion. Lungs appear clear, with no acute consolidative airspace disease and no pulmonary edema. IMPRESSION: 1. Well-positioned support structures. No pneumothorax. 2. No active cardiopulmonary disease. Electronically Signed   By: Ilona Sorrel M.D.   On: 04/13/2021 07:37    Cardiac Studies  TTE 04/09/2021  1. Left ventricular ejection fraction, by estimation, is 25 to 30%. The  left ventricle has severely  decreased function. The left ventricle  demonstrates global hypokinesis. There is moderate left ventricular  hypertrophy. Left ventricular diastolic  function could not  be evaluated. Elevated left ventricular end-diastolic  pressure. There is akinesis of the left ventricular, mid-apical septal  wall.   2. Right ventricular systolic function is mildly reduced. The right  ventricular size is normal.   3. Left atrial size was moderately dilated.   4. Right atrial size was mildly dilated.   5. The mitral valve is abnormal. Mild mitral valve regurgitation. No  evidence of mitral stenosis. Moderate to severe mitral annular  calcification.   6. The aortic valve is grossly normal. There is mild calcification of the  aortic valve. There is mild thickening of the aortic valve. Aortic valve  regurgitation is not visualized. Mild to moderate aortic valve  sclerosis/calcification is present, without   any evidence of aortic stenosis.   Patient Profile  65 year old female with history of ESRD on hemodialysis, CAD/PCI's (LAD in 2017/RCA in 2019), paroxysmal atrial fibrillation on Coumadin, systolic heart failure, hypertension, stroke who was admitted on 03/30/2021 with lethargy and confusion and found to have elevated troponin concerning for non-STEMI.  Course complicated by acute stroke and deterioration in mental status on 04/09/2021 requiring intubation and transfer to the ICU.  ICU course has been complicated by persistent shock (mixed cardiogenic/vasovagal from hepatic failure/sepsis) and lack of improvement in neurological status.  Assessment & Plan   #Hepatic encephalopathy #Acute liver failure #Mixed shock (cardiogenic/hepatic/septic) -Admitted with waxing and waning in mental status for 4 months.  Was in liver failure.  Also had non-STEMI. -Was taken to Cath Lab but cannot participate with cath.  Non-STEMI does not explain her altered mental status. -She remains on pressors in the ICU.  Her  overall condition is not improving.  Mixed venous suggest this is a mixed picture.  I suspect this is mixed hepatic and cardiogenic shock with likely sepsis element given elevated procalcitonin.  She is not a candidate for mechanical cardiovascular support.  For now would recommend just continue with pressors.  Inotropes may not be the best option for her at this time.  I do fear she is heading towards likely hospice care.  Her condition is not improving and her shock is remaining persistent.  Again very limited options from a cardiovascular standpoint. -Overall it appears her non-STEMI was a secondary process.  #Non-STEMI -Likely secondary to hepatic failure and chronically ill state.  Also with known congestive heart failure. -Has completed 48 hours of heparin but remains on heparin drip due to A. fib and strokes. -No statin in the setting of liver failure. -Hypotensive on pressors.  No beta-blockers.  #Acute stroke -Subtherapeutic INR on arrival.  Was on and off Coumadin for potential heart cath. -Appears to be having poor neurological status.  #Systolic heart failure, EF 25 to 30% -EF unchanged.  Suspect her shock state is just being worsened by her congestive heart failure which is chronic. -Poor prognosis.  Not a candidate for advanced heart failure therapies.  She is in multiorgan system failure.  #Atrial fibrillation -Back in atrial fibrillation.  Driven by critical illness. -Hold any AV nodal agents.  Would be very cautious with amiodarone given liver failure. -On heparin due to stroke this admission.   For questions or updates, please contact [place] Please consult [email or web address] for contact info under   Time Spent with Patient: I have spent a total of 35 minutes with patient reviewing hospital notes, telemetry, EKGs, labs and examining the patient as well as establishing an assessment and plan that was discussed with the patient.  > 50% of time was spent  in direct patient  care.    Signed, Addison Naegeli. Audie Box, MD, [place]  04/14/2021 8:06 AM

## 2021-04-14 NOTE — Progress Notes (Signed)
NAME:  Amber Young, MRN:  [id number], DOB:  06/16/1956, LOS: 7 ADMISSION DATE:  03/30/2021, CONSULTATION DATE:  04/09/21 REFERRING MD: [facility] hospitalist, CHIEF COMPLAINT:  Acute respiratory failure and altered mental status in the setting of benign elevated ST MI with troponins of 14,000 and in a patient with end-stage renal disease.   History of Present Illness:  Amber Young is a 65 year old female her of health issues significant for but not limited to atrial fibrillation combined systolic and diastolic heart failure with last 2D showing EF of 40 to 45% in 2019.  She has also end-stage renal disease and has a tunneled catheter and a new AV graft placed on 8/25 left forearm.  She had the graft placed on 8/25 and return to [facility] on 03/25/2021 with general malaise not feeling well left-sided chest pain troponin greater than 14,000.  She was scheduled for cardiac catheterization on 04/06/2021 but patient was unable to complete at a time due to pain and discomfort and refusing the procedure. 04/09/2021 she has been transferred to the intensive care unit full mechanical ventilatory support anticoagulation is on hold neurology will be consulted and continue current level of intensive care treatment.  Pertinent  Medical History   Past Medical History:  Diagnosis Date   Anemia    Atrial fibrillation ([place]) 06/17/2019   Cataract, bilateral    Chronic combined systolic and diastolic CHF (congestive heart failure) ([place])    a. EF 40-45% in 2019 b. EF 25-30% by repeat echo in 01/2019   CKD (chronic kidney disease), stage IV (HCC)    Coronary artery disease 08/2017   DES x 2 RCA   Dialysis patient ([place]) 07/2019   DKA (diabetic ketoacidoses) 06/16/2019   GERD (gastroesophageal reflux disease)    Glaucoma, left eye    Heart murmur    Hyperlipidemia    Hypertension    Proliferative diabetic retinopathy ([place])    left eye with vitreous hemorrhage and tractional retinal detachment   Stroke  ([place]) 09/2014   numbness left upper lip, finger tips on left hand; "resolved" (05/18/2016)   Type II diabetes mellitus ([place])    Wears glasses      Significant Hospital Events: Including procedures, antibiotic start and stop dates in addition to other pertinent events   8/31 intubated on the floor 8/31 started on CRRT  MRI brain with multiple small infarcts in left ACA, PICA territories suggestive of embolic events. 9/1 Levophed for hypotension 9/2 CTA head, multivessel narrowing, started on low-dose anticoagulation, continuous EEG did not show seizures-discontinued 9/2 Loss of brainstem reflexes-pupillary, corneals, cough, gag, initiating a few breaths   Interim History / Subjective:  Loss of brainstem reflexes  Objective   Blood pressure 99/67, pulse (!) 55, temperature (!) 95.2 F (35.1 C), temperature source Rectal, resp. rate (!) 27, height '5\' 7"'$  (1.702 m), weight 66.1 kg, SpO2 95 %. CVP:  [5 mmHg] 5 mmHg  Vent Mode: PRVC FiO2 (%):  [40 %] 40 % Set Rate:  [18 bmp] 18 bmp Vt Set:  [490 mL] 490 mL PEEP:  [5 cmH20] 5 cmH20 Plateau Pressure:  [17 cmH20-21 cmH20] 21 cmH20   Intake/Output Summary (Last 24 hours) at 04/14/2021 0920 Last data filed at 04/14/2021 0900 Gross per 24 hour  Intake 2616.98 ml  Output 3480 ml  Net -863.02 ml   Filed Weights   04/12/21 0425 04/13/21 0500 04/14/21 0500  Weight: 68.1 kg 67.8 kg 66.1 kg    Examination: General: No sedation, comfortable HENT: Moist  oral mucosa, endotracheal tube in place Lungs: Clear breath sounds Cardiovascular: S1-S2 appreciated, Abdomen: Bowel sounds appreciated Extremities: No clubbing, no edema Neuro: Loss of brainstem reflexes GU: On ultrafiltration   04/09/21-2D echo  1. Left ventricular ejection fraction, by estimation, is 25 to 30%. The  left ventricle has severely decreased function. The left ventricle  demonstrates global hypokinesis. There is moderate left ventricular  hypertrophy. Left ventricular  diastolic  function could not be evaluated. Elevated left ventricular end-diastolic  pressure. There is akinesis of the left ventricular, mid-apical septal  wall.   2. Right ventricular systolic function is mildly reduced. The right  ventricular size is normal.   3. Left atrial size was moderately dilated.   4. Right atrial size was mildly dilated.   5. The mitral valve is abnormal. Mild mitral valve regurgitation. No  evidence of mitral stenosis. Moderate to severe mitral annular  calcification.   6. The aortic valve is grossly normal. There is mild calcification of the  aortic valve. There is mild thickening of the aortic valve. Aortic valve  regurgitation is not visualized. Mild to moderate aortic valve  sclerosis/calcification is present, without   any evidence of aortic stenosis.  9/1 MRI brain >> shows an acute left cerebellar stroke, as well as remote strokes at bilateral cerebellar hemispheres and right thalamus, suggestive of embolic eents affecting the left ACA and left PICA territories 04/10/2021 EEG>> This study is suggestive of moderate diffuse encephalopathy, nonspecific etiology but could be secondary to toxic-metabolic causes. No seizures were seen throughout the recording. 9/2 CT headCTA head:  1. Occlusion of the left A1 ACA and multifocal severe stenosis of the left A2 and A3 ACA. Severe stenosis of the right A3 ACA. 2. Moderate right and severe left paraclinoid ICA stenosis. Rounded filling defect in the left paraclinoid ICA could represent atherosclerotic plaque and/or intraluminal thrombus. 3. Multifocal severe bilateral P2 PCA stenosis. 4. Multifocal severe stenosis of a left M2 MCA branch and multifocal mild to moderate right M2 MCA stenosis. 5. Moderate stenosis of bilateral proximal intradural vertebral arteries.  CTA neck:  1. Approximately 60-70% stenosis of the left common carotid artery. 2. Limited evaluation of the proximal vertebral arteries with  likely severe left vertebral artery origin stenosis. 3. Small and irregular internal carotid arteries with long/smooth segment narrowing and luminal diameter of only a few mm bilaterally at C1-C2.  CT head:  1. Slightly increased edema associated with known acute infarcts in the left cerebellum and high parasagittal left parietal lobe. No progressive mass effect. 2. Moderate chronic microvascular ischemic disease and mild to moderate atrophy.   9/3 chest x-ray-within normal limits, reviewed by myself  ABG with mild respiratory alkalosis Leukocytosis  Resolved Hospital Problem list     Assessment & Plan:  Change in mental status -Loss of brainstem reflexes -This was discussed with patient's spouse and one of her daughters  Ventilator dependent respiratory failure in setting of altered mental status MI with troponins in the 14,000 Questionable seizure activity -Continue ventilator support -Worsening neuro status will barrier to weaning -Has not needed any sedation  Altered mental status with questionable left arm seizure activity Multiple embolic infarcts secondary to atrial fibrillation Myocardial infarction -On anticoagulation -Continue Keppra  Elevated liver enzymes Elevated lactate from poor clearance -N-acetylcysteine completed 9/4 -Levels improving  Leukocytosis 1 unit blood culture positive -On Rocephin from 9/2-9/5 -Switched to cefepime -Follow blood cultures from 04/13/2021 -Chest x-ray shows no infiltrate  Atrial fibrillation History of congestive heart failure Non-ST  elevation MI with troponins in the 14,000 -Remains on vasopressin and Levophed  Diabetes -Sliding scale insulin -Enteral tube coverage  Diarrhea Flexi-Seal in place  Prognosis is poor with worsening neuro findings, extensive vascular pathology on CT Multiple strokes, background end-stage renal disease, new myocardial infarction, atrial fibrillation  Goals of care discussion with  spouse at bedside today 9/5 -We will continue full scope of care at present  Best Practice (right click and "Reselect all SmartList Selections" daily)   Diet/type: tubefeeds DVT prophylaxis: systemic heparin GI prophylaxis: H2B Lines: Central line Foley:  Yes, and it is still needed Code Status:  full code Last date of multidisciplinary goals of care discussion [9/3-updated spouse at bedside ]  Labs   CBC: Recent Labs  Lab 03/15/2021 0454 04/09/21 0248 04/10/21 0500 04/11/21 0430 04/12/21 0353 04/12/21 0500 04/13/21 0500 04/14/21 0318  WBC 8.5   < > 18.3* 20.0* 26.8*  --  31.5* 30.8*  NEUTROABS 6.3  --  16.5*  --  24.7*  --   --   --   HGB 9.6*   < > 10.0* 10.5* 10.6* 12.2 11.2* 11.6*  HCT 30.0*   < > 29.9* 32.1* 32.5* 36.0 34.6* 35.7*  MCV 84.7   < > 82.4 84.9 84.0  --  85.0 85.2  PLT 256   < > 246 250 245  --  220 174   < > = values in this interval not displayed.    Basic Metabolic Panel: Recent Labs  Lab 04/10/21 0500 04/10/21 1830 04/11/21 0430 04/11/21 1632 04/12/21 0353 04/12/21 0500 04/12/21 1527 04/13/21 0500 04/13/21 1559 04/14/21 0318  NA 137 138 133* 136 137 139 136 136 134* 134*  K 4.0 3.3* 3.8 3.7 3.9 3.9 4.3 4.5 4.8 4.9  CL 100 100 100 103 104  --  103 104 101 101  CO2 21* 22 21* 21* 23  --  23 20* 22 20*  GLUCOSE 99 228* 223* 223* 233*  --  277* 190* 371* 240*  BUN 39* '21 17 17 14  '$ --  '12 12 16 16  '$ CREATININE 4.72* 2.24* 1.68* 1.49* 1.23*  --  1.02* 0.94 0.98 1.02*  CALCIUM 8.8* 8.4* 8.7* 8.7* 8.8*  --  8.9 9.0 8.9 9.3  MG 2.6*  --  2.6*  --  2.6*  --   --  2.6*  --  2.8*  PHOS 5.2*  5.2* 3.1  --  2.2*  --   --  1.5*  --  1.6*  --    GFR: Estimated Creatinine Clearance: 53.5 mL/min (A) (by C-G formula based on SCr of 1.02 mg/dL (H)). Recent Labs  Lab 04/06/2021 1442 04/06/2021 0454 03/28/2021 0454 04/09/21 0248 04/10/21 0500 04/10/21 0755 04/10/21 2255 04/11/21 0430 04/12/21 0350 04/12/21 0353 04/13/21 0500 04/14/21 0318  PROCALCITON   --  4.29  4.30  --  3.85  --   --   --   --   --  3.86 3.38  --   WBC  --  8.5   < > 12.2*   < >  --   --  20.0*  --  26.8* 31.5* 30.8*  LATICACIDVEN 2.9*  --   --   --   --  3.5* 3.7*  --  3.2*  --   --   --    < > = values in this interval not displayed.    Liver Function Tests: Recent Labs  Lab 04/10/21 0500 04/10/21 1830 04/11/21 0430 04/11/21 1632 04/12/21  [id number] 04/12/21 1527 04/13/21 0500 04/13/21 1559 04/14/21 0318  AST 9,649*  --  4,440*  --  2,240*  --  1,304*  --  878*  ALT 2,575*  --  1,923*  --  1,440*  --  1,305*  --  1,055*  ALKPHOS 119  --  151*  --  203*  --  216*  --  242*  BILITOT 2.2*  --  2.3*  --  2.0*  --  1.8*  --  2.1*  PROT 6.2*  --  6.1*  --  5.9*  --  6.3*  --  6.4*  ALBUMIN 3.0*  2.9*   < > 2.5*   < > 2.4* 2.4* 2.4* 2.4* 2.4*   < > = values in this interval not displayed.   No results for input(s): LIPASE, AMYLASE in the last 168 hours.  Recent Labs  Lab 04/02/2021 1552 04/10/21 1442 04/12/21 0350  AMMONIA 68* 47* 26    ABG    Component Value Date/Time   PHART 7.450 04/12/2021 0500   PCO2ART 32.5 04/12/2021 0500   PO2ART 109 (H) 04/12/2021 0500   HCO3 22.7 04/12/2021 0500   TCO2 24 04/12/2021 0500   ACIDBASEDEF 1.0 04/12/2021 0500   O2SAT 47.2 04/14/2021 0318     Coagulation Profile: Recent Labs  Lab 04/10/21 0536 04/10/21 2240 04/11/21 0430 04/12/21 0353 04/13/21 0500  INR 3.1* 3.2* 3.1* 2.5* 2.3*    Cardiac Enzymes: No results for input(s): CKTOTAL, CKMB, CKMBINDEX, TROPONINI in the last 168 hours.  HbA1C: Hemoglobin A1C  Date/Time Value Ref Range Status  06/28/2020 10:13 AM 8.5 (A) 4.0 - 5.6 % Final   HbA1c, POC (prediabetic range)  Date/Time Value Ref Range Status  06/28/2020 10:13 AM 8.5 (A) 5.7 - 6.4 % Final   HbA1c, POC (controlled diabetic range)  Date/Time Value Ref Range Status  06/28/2020 10:13 AM 8.5 (A) 0.0 - 7.0 % Final   HbA1c POC (<> result, manual entry)  Date/Time Value Ref Range Status   06/28/2020 10:13 AM 8.5 4.0 - 5.6 % Final   Hgb A1c MFr Bld  Date/Time Value Ref Range Status  04/06/2021 04:54 AM 7.0 (H) 4.8 - 5.6 % Final    Comment:    (NOTE) Pre diabetes:          5.7%-6.4%  Diabetes:              >6.4%  Glycemic control for   <7.0% adults with diabetes   06/29/2019 10:11 PM 8.9 (H) 4.8 - 5.6 % Final    Comment:    (NOTE) Pre diabetes:          5.7%-6.4% Diabetes:              >6.4% Glycemic control for   <7.0% adults with diabetes     CBG: Recent Labs  Lab 04/13/21 1510 04/13/21 1911 04/13/21 2309 04/14/21 0315 04/14/21 0722  GLUCAP 288* 229* 269* 231* 94    Review of Systems:   Not interactive  Past Medical History:  She,  has a past medical history of Anemia, Atrial fibrillation ([place]) (06/17/2019), Cataract, bilateral, Chronic combined systolic and diastolic CHF (congestive heart failure) ([place]), CKD (chronic kidney disease), stage IV ([place]), Coronary artery disease (08/2017), Dialysis patient ([place]) (07/2019), DKA (diabetic ketoacidoses) (06/16/2019), GERD (gastroesophageal reflux disease), Glaucoma, left eye, Heart murmur, Hyperlipidemia, Hypertension, Proliferative diabetic retinopathy ([place]), Stroke ([place]) (09/2014), Type II diabetes mellitus ([place]), and Wears glasses.   Surgical History:   Past Surgical History:  Procedure Laterality Date   AV FISTULA PLACEMENT Left 07/10/2019   Procedure: ARTERIOVENOUS (AV) FISTULA CREATION;  Surgeon: Angelia Mould, MD;  Location: [place];  Service: Vascular;  Laterality: Left;   AV FISTULA PLACEMENT Left 04/03/2021   Procedure: INSERTION OF LEFT UPPER ARM ARTERIOVENOUS (AV) GORE-TEX GRAFT;  Surgeon: Serafina Mitchell, MD;  Location: [place];  Service: Vascular;  Laterality: Left;   CARDIAC CATHETERIZATION N/A 05/18/2016   Procedure: Left Heart Cath and Coronary Angiography;  Surgeon: Jettie Booze, MD;  Location: [place] [facility];  Service: Cardiovascular;  Laterality: N/A;   CARDIAC  CATHETERIZATION N/A 05/18/2016   Procedure: Coronary Stent Intervention;  Surgeon: Jettie Booze, MD;  Location: [place] [facility];  Service: Cardiovascular;  Laterality: N/A;   CATARACT EXTRACTION Right 2020   COLONOSCOPY W/ BIOPSIES AND POLYPECTOMY     CORONARY ANGIOPLASTY     CORONARY ANGIOPLASTY WITH STENT PLACEMENT     CORONARY STENT INTERVENTION N/A 09/07/2017   Procedure: CORONARY STENT INTERVENTION;  Surgeon: Leonie Man, MD;  Location: [place] [facility];  Service: Cardiovascular;  Laterality: N/A;   EYE SURGERY     GAS INSERTION Left 01/13/2018   Procedure: INSERTION OF GAS;  Surgeon: Bernarda Caffey, MD;  Location: [place];  Service: Ophthalmology;  Laterality: Left;   INSERTION OF DIALYSIS CATHETER Right 07/10/2019   Procedure: INSERTION OF TUNNELED DIALYSIS CATHETER;  Surgeon: Angelia Mould, MD;  [facility];  Service: Vascular;  Laterality: Right;   IR FLUORO GUIDE CV LINE RIGHT  07/05/2019   IR US GUIDE VASC ACCESS RIGHT  07/05/2019   LEFT HEART CATH AND CORONARY ANGIOGRAPHY N/A 09/07/2017   Procedure: LEFT HEART CATH AND CORONARY ANGIOGRAPHY;  Surgeon: Leonie Man, MD;  Location: [place] [facility];  Service: Cardiovascular;  Laterality: N/A;   LEFT HEART CATH AND CORONARY ANGIOGRAPHY N/A 03/31/2021   Procedure: LEFT HEART CATH AND CORONARY ANGIOGRAPHY;  Surgeon: Belva Crome, MD;  Location: [place] [facility];  Service: Cardiovascular;  Laterality: N/A;  Canceled   MEMBRANE PEEL Left 01/13/2018   Procedure: MEMBRANE PEEL LEFT EYE ;  Surgeon: Bernarda Caffey, MD;  Location: [place];  Service: Ophthalmology;  Laterality: Left;   MULTIPLE TOOTH EXTRACTIONS     PARS PLANA VITRECTOMY Left 01/13/2018   Procedure: PARS PLANA VITRECTOMY WITH 25 GAUGE LEFT EYE WITH ENDOLASER;  Surgeon: Bernarda Caffey, MD;  Location: [place];  Service: Ophthalmology;  Laterality: Left;   [place]   "partial; fibroids"     Social History:    reports that she has never smoked. She has never used smokeless tobacco. She reports that she does not drink alcohol and does not use drugs.   Family History:  Her family history includes COPD in her mother; Diabetes in her mother; Heart failure in her mother; Hypertension in her brother, mother, and sister.   Allergies Allergies  Allergen Reactions   Ace Inhibitors Cough     The patient is critically ill with multiple organ systems failure and requires high complexity decision making for assessment and support, frequent evaluation and titration of therapies, application of advanced monitoring technologies and extensive interpretation of multiple databases. Critical Care Time devoted to patient care services described in this note independent of APP/resident time (if applicable)  is 40 minutes.   Sherrilyn Rist MD [facility] Personal pager: See Amion If unanswered, please page CCM On-call: [phone]

## 2021-04-14 NOTE — Progress Notes (Signed)
Consulted palliative care to see in AM

## 2021-04-14 NOTE — Progress Notes (Signed)
[place] [facility] Progress Note Patient Name: Amber Young DOB: 06-08-1956 MRN: [id number]   Date of Service  04/14/2021  HPI/Events of Note  Bedside RN reports onset of lack of corneal reflex in LEFT eye.  This is an isolated change.  Other neuro findings have been the same  eICU Interventions  No new interventions ordered.  Change is isolated and subtle     Intervention Category Intermediate Interventions: Other: Change in physical exam finding  Tilden Dome 04/14/2021, 5:35 AM

## 2021-04-14 NOTE — Progress Notes (Signed)
   04/14/21 1835  Clinical Encounter Type  Visited With Family;Patient not available  Visit Type Code  Referral From Nurse  Consult/Referral To Chaplain   Chaplain responded. The medical team was attending to the patient. This chaplain entered room 1 as the MD provided a medical update to the patient's husband Milbert Coulter). This chaplain provided emotional support to Mr. Milbert Coulter as he expressed that he was not pleased with his wife's medical care on 55M. This chaplain provided spiritual support and active listening as he spoke about his faith, trusting Gaylene Brooks (God) and having peace if Gaylene Brooks decides it's time for his wife to transition to heaven.  Mr. Milbert Coulter appeared to be hesitant about visiting his wife after the code and being invited to return to the patient's room by two nurses. This note was prepared by Jeanine Luz, M.Div..  For questions please contact by phone [phone].

## 2021-04-14 NOTE — Progress Notes (Signed)
PATIENT NAME: Dalzell RECORD NUMBER: [id number] Birthday: [birth year]  Age: 65 y.o. Admit Date: 04/02/2021  Provider: Ander Slade  Indication: PEA arrest  Technical Description:  CPR performance duration: 11 minutes Was defibrillation or cardioversion used ? defibrillation Was external pacer placed ? yes Was patient intubated pre/post CPR ? preno Was transvenous pacer placed ? no  Medications Administered Include      Yes/no Amiodarone x  Atropin   Calcium   Epinephrine x  Lidocaine   Magnesium   Norepinephrine   Phenylephrine   Sodium bicarbonate x  Vasopression    Evaluation Final Status - Was patient successfully resuscitated ? yes If successfully resuscitated - what is current rhythm ? sinus If successfully resuscitated - what is current hemodynamic status ? stable  Miscellaneous Information Multiple embolic strokes, loss of brainstem reflexes  Sherleen Pangborn A Aminata [place] 9/5/20228:36 PM

## 2021-04-14 NOTE — Progress Notes (Signed)
ANTICOAGULATION CONSULT NOTE  Pharmacy Consult for heparin Indication: chest pain/ACS/stroke   Allergies  Allergen Reactions   Ace Inhibitors Cough    Patient Measurements: Height: '5\' 7"'$  (170.2 cm) Weight: 66.1 kg (145 lb 11.6 oz) IBW/kg (Calculated) : 61.6 Heparin Dosing Weight: 68 kg   Vital Signs: Temp: 94.5 F (34.7 C) (09/05 0315) Temp Source: Axillary (09/05 0315) BP: 80/66 (09/05 0700) Pulse Rate: 50 (09/05 0700)  Labs: Recent Labs    04/12/21 0353 04/12/21 0500 04/12/21 1419 04/13/21 0500 04/13/21 1344 04/13/21 1559 04/13/21 2136 04/13/21 2231 04/14/21 0318  HGB 10.6* 12.2  --  11.2*  --   --   --   --  11.6*  HCT 32.5* 36.0  --  34.6*  --   --   --   --  35.7*  PLT 245  --   --  220  --   --   --   --  174  LABPROT 26.9*  --   --  25.6*  --   --   --   --   --   INR 2.5*  --   --  2.3*  --   --   --   --   --   HEPARINUNFRC <0.10*  --    < > 0.63   < >  --  <0.10* 0.49 0.48  CREATININE 1.23*  --    < > 0.94  --  0.98  --   --  1.02*   < > = values in this interval not displayed.    Estimated Creatinine Clearance: 53.5 mL/min (A) (by C-G formula based on SCr of 1.02 mg/dL (H)).   Assessment: 34 YOF who presents with weakness and decreased appetite. Troponin elevated (peaked at 14,294) concerning for NSTEMI. Of note, patient is on warfarin at home for h/o Afib. 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 INR trending down (likely not reflective of hemorrhagic risk given liver dysfunction).   Heparin level remains therapeutic at 0.48 on current rate of 800 units/hr. No bleeding noted. Neuro status change overnight and going for CT scan today. Discussed with Dr. Ander Slade regarding continuation of heparin -- continuing for now.   Goal of Therapy:  Heparin level 0.3 to 0.5  units/ml Monitor platelets by anticoagulation protocol: Yes   Plan:  Decrease heparin infusion at 750 units/hr to keep at low goal Daily heparin level and CBC Follow-up head CT  Sloan Leiter, PharmD, BCPS, BCCCP Clinical Pharmacist Please refer to [facility] for [place] numbers 04/14/2021 7:17 AM

## 2021-04-14 NOTE — Code Documentation (Signed)
  Patient Name: Amber Young   MRN: [id number]   Date of Birth/ Sex: [birth year] , female      Admission Date: 03/15/2021  Attending Provider: Laurin Coder, MD  Primary Diagnosis: NSTEMI (non-ST elevated myocardial infarction) [facility])    Indication: Pt was in her usual state of health until this PM, when she was noted to be in PEA. Code blue was subsequently called. At the time of arrival on scene, ACLS protocol was underway.   Technical Description:  - CPR performance duration:  11 minutes  - Was defibrillation or cardioversion used? No   - Was external pacer placed? No  - Was patient intubated pre/post CPR? Yes   Medications Administered: Y = Yes; Blank = No Amiodarone    Atropine    Calcium    Epinephrine  x  Lidocaine    Magnesium    Norepinephrine    Phenylephrine    Sodium bicarbonate  x  Vasopressin     Post CPR evaluation:  - Final Status - Was patient successfully resuscitated ? Yes - What is current rhythm? Sinus tachycardia - What is current hemodynamic status? stable  Miscellaneous Information:  - Labs sent, including: N/A  - Primary team notified?  Yes  - Family Notified? No  - Additional notes/ transfer status: Patient already in the ICU, prior code at [place], [place], MD  04/14/2021, 10:54 PM

## 2021-04-14 NOTE — Plan of Care (Signed)
PCCM interval progress note:   Pt had PEA arrest again overnight, code blue called and CPR initiated, underwent several rounds of ACLS with three epi's, 4 amps bicarb, and shocked for V-fib with return of pulses.  I spoke with patient's husband and daughter via phone and updated them, conveyed I suspect patient will code again.   Pt's husband and daughter agreed that repeated chest compressions are not likely to to benefit.  They agreed to DNR status, we will continue supportive care.   Amber Carpen Mikahla Wisor, PA-C

## 2021-04-14 NOTE — Progress Notes (Signed)
Noted to be going bradycardic again  Will give 0.4 atropine for bradycardia less than 40  Dopamine may also need to be initiated if needed  Prognosis is poor

## 2021-04-14 NOTE — Progress Notes (Signed)
Pharmacy Antibiotic Note  Amber Young is a 65 y.o. female admitted on 04/09/2021 with sepsis.  Pharmacy has been consulted for Cefepime dosing. Patient has been on Ceftriaxone for Strep mitis in blood. Clinical status worsening with increased leukocytosis, increased vasopressor needs. Broadening antibiotic coverage for now. Cultures sent 9/4 - ngtd reported. Remains on CRRT - running well with no issues. Effluent rate high at 45.    Plan: Discontinue Ceftriaxone.  Start Cefepime 2g IV every 8 hours while on current rates of CRRT. Monitor clinical status, culture results, and ability to re-narrow antibiotics.  Monitor CRRT and nephrology plans.   Height: '5\' 7"'$  (170.2 cm) Weight: 66.1 kg (145 lb 11.6 oz) IBW/kg (Calculated) : 61.6  Temp (24hrs), Avg:96.6 F (35.9 C), Min:94.5 F (34.7 C), Max:98 F (36.7 C)  Recent Labs  Lab 04/02/2021 1442 03/15/2021 0454 04/10/21 0500 04/10/21 0755 04/10/21 1830 04/10/21 2255 04/11/21 0430 04/11/21 1632 04/12/21 0350 04/12/21 0353 04/12/21 1527 04/13/21 0500 04/13/21 1559 04/14/21 0318  WBC  --    < > 18.3*  --   --   --  20.0*  --   --  26.8*  --  31.5*  --  30.8*  CREATININE  --    < > 4.72*  --    < >  --  1.68*   < >  --  1.23* 1.02* 0.94 0.98 1.02*  LATICACIDVEN 2.9*  --   --  3.5*  --  3.7*  --   --  3.2*  --   --   --   --   --    < > = values in this interval not displayed.    Estimated Creatinine Clearance: 53.5 mL/min (A) (by C-G formula based on SCr of 1.02 mg/dL (H)).    Allergies  Allergen Reactions   Ace Inhibitors Cough    Antimicrobials this admission: Vanc 9/1 >>9/2 Zosyn 9/1 >>9/2 Ceftriaxone 9/2 >> 9/5 Cefepime 9/5 >>  Dose adjustments this admission:   Microbiology results: 8/29 BCx >> 4/4 ngtd 9/1 BCx >> 1/4 Strep mitis/oralis 9/4 BCx >>  Thank you for allowing pharmacy to be a part of this patient's care.  Sloan Leiter, PharmD, BCPS, BCCCP Clinical Pharmacist Please refer to [facility] for [facility] numbers 04/14/2021 10:02 AM

## 2021-04-14 NOTE — Progress Notes (Signed)
Confirmed access with team. Fran Lowes, RN VAST

## 2021-04-15 LAB — CULTURE, BLOOD (ROUTINE X 2)
Culture: NO GROWTH
Special Requests: ADEQUATE

## 2021-04-15 LAB — GLUCOSE, CAPILLARY
Glucose-Capillary: 194 mg/dL — ABNORMAL HIGH (ref 70–99)
Glucose-Capillary: 69 mg/dL — ABNORMAL LOW (ref 70–99)

## 2021-04-15 LAB — CBC
HCT: 35.5 % — ABNORMAL LOW (ref 36.0–46.0)
Hemoglobin: 11.5 g/dL — ABNORMAL LOW (ref 12.0–15.0)
MCH: 27.5 pg (ref 26.0–34.0)
MCHC: 32.4 g/dL (ref 30.0–36.0)
MCV: 84.9 fL (ref 80.0–100.0)
Platelets: 184 10*3/uL (ref 150–400)
RBC: 4.18 MIL/uL (ref 3.87–5.11)
RDW: 20 % — ABNORMAL HIGH (ref 11.5–15.5)
WBC: 37.7 10*3/uL — ABNORMAL HIGH (ref 4.0–10.5)
nRBC: 95.5 % — ABNORMAL HIGH (ref 0.0–0.2)

## 2021-04-15 LAB — COMPREHENSIVE METABOLIC PANEL
ALT: 923 U/L — ABNORMAL HIGH (ref 0–44)
AST: 1659 U/L — ABNORMAL HIGH (ref 15–41)
Albumin: 2.1 g/dL — ABNORMAL LOW (ref 3.5–5.0)
Alkaline Phosphatase: 252 U/L — ABNORMAL HIGH (ref 38–126)
Anion gap: 21 — ABNORMAL HIGH (ref 5–15)
BUN: 41 mg/dL — ABNORMAL HIGH (ref 8–23)
CO2: 21 mmol/L — ABNORMAL LOW (ref 22–32)
Calcium: 9.3 mg/dL (ref 8.9–10.3)
Chloride: 102 mmol/L (ref 98–111)
Creatinine, Ser: 2.23 mg/dL — ABNORMAL HIGH (ref 0.44–1.00)
GFR, Estimated: 24 mL/min — ABNORMAL LOW (ref >60)
Glucose, Bld: 72 mg/dL (ref 70–99)
Potassium: 5.3 mmol/L — ABNORMAL HIGH (ref 3.5–5.1)
Sodium: 144 mmol/L (ref 135–145)
Total Bilirubin: 3.2 mg/dL — ABNORMAL HIGH (ref 0.3–1.2)
Total Protein: 5.8 g/dL — ABNORMAL LOW (ref 6.5–8.1)

## 2021-04-15 LAB — COOXEMETRY PANEL
Carboxyhemoglobin: 1.2 % (ref 0.5–1.5)
Methemoglobin: 1.2 % (ref 0.0–1.5)
O2 Saturation: 47.5 %
Total hemoglobin: 12 g/dL (ref 12.0–16.0)

## 2021-04-15 LAB — MAGNESIUM: Magnesium: 3.9 mg/dL — ABNORMAL HIGH (ref 1.7–2.4)

## 2021-04-15 LAB — HEPARIN LEVEL (UNFRACTIONATED): Heparin Unfractionated: 0.25 IU/mL — ABNORMAL LOW (ref 0.30–0.70)

## 2021-04-15 MED ORDER — DEXTROSE 50 % IV SOLN
1.0000 | INTRAVENOUS | Status: DC | PRN
  Administered 2021-04-15: 50 mL via INTRAVENOUS

## 2021-04-15 MED FILL — Medication: Qty: 1 | Status: AC

## 2021-04-16 LAB — PATHOLOGIST SMEAR REVIEW

## 2021-04-16 LAB — CALCIUM, IONIZED: Calcium, Ionized, Serum: 3.8 mg/dL — ABNORMAL LOW (ref 4.5–5.6)

## 2021-04-18 LAB — CULTURE, BLOOD (ROUTINE X 2)
Culture: NO GROWTH
Culture: NO GROWTH

## 2021-04-21 MED FILL — Medication: Qty: 1 | Status: AC

## 2021-05-10 NOTE — Progress Notes (Signed)
ANTICOAGULATION CONSULT NOTE  Pharmacy Consult for heparin Indication: chest pain/ACS/stroke   Allergies  Allergen Reactions   Ace Inhibitors Cough    Patient Measurements: Height: '5\' 7"'$  (170.2 cm) Weight: 66.6 kg (146 lb 13.2 oz) IBW/kg (Calculated) : 61.6 Heparin Dosing Weight: 68 kg   Vital Signs: Temp: 95.2 F (35.1 C) (09/05 2000) Temp Source: Axillary (09/05 2000) BP: 144/68 (09/06 0245) Pulse Rate: 73 (09/05 1715)  Labs: Recent Labs    04/13/21 0500 04/13/21 1344 04/13/21 2231 04/14/21 0318 04/14/21 1627 04/14/21 1852 04/14/21 2254 May 05, 2021 0330  HGB 11.2*  --   --  11.6*  --  10.0* 8.8* 11.5*  HCT 34.6*  --   --  35.7*  --  31.5* 26.0* 35.5*  PLT 220  --   --  174  --  159  --  184  LABPROT 25.6*  --   --   --   --   --   --   --   INR 2.3*  --   --   --   --   --   --   --   HEPARINUNFRC 0.63   < > 0.49 0.48  --   --   --  0.25*  CREATININE 0.94   < >  --  1.02* 1.19* 1.21*  --   --    < > = values in this interval not displayed.     Estimated Creatinine Clearance: 45.1 mL/min (A) (by C-G formula based on SCr of 1.21 mg/dL (H)).   Assessment: 56 YOF who presents with weakness and decreased appetite. Troponin elevated (peaked at 14,294) concerning for NSTEMI. Of note, patient is on warfarin at home for h/o Afib. 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 INR trending down (likely not reflective of hemorrhagic risk given liver dysfunction).   Heparin level down to subtherapeutic (0.25) on gtt at 750 units/hr. No issues with line or bleeding reported per RN. Confirmed that heparin was not stopped during code. CT 9/6 yesterday afternoon showed progression of stroke but no evidence of hemorrhagic conversion.  Goal of Therapy:  Heparin level 0.3 to 0.5 units/ml Monitor platelets  by anticoagulation protocol: Yes   Plan:  Increase heparin infusion slightly to 800 units/hr  F/u plans of care   Sherlon Handing, PharmD, BCPS Please see amion for complete clinical pharmacist phone list 05/05/21 4:09 AM

## 2021-05-10 NOTE — Progress Notes (Signed)
[phone] Attempted to call spouse again, no response. Charge RN aware.   Sheppard Penton RN

## 2021-05-10 NOTE — Death Summary Note (Signed)
DEATH SUMMARY   Patient Details  Name: Amber Young MRN: [id number] DOB: August 21, 1955  Admission/Discharge Information   Admit Date:  04/19/21  Date of Death: Date of Death: 04/27/2021  Time of Death: Time of Death: 0541  Length of Stay: 8  Referring Physician: Pcp, No   Reason(s) for Hospitalization  Patient was admitted with complaints of weakness, decreased appetite  Diagnoses  Preliminary cause of death:  Non-ST elevation myocardial infarction Cerebrovascular accident Secondary Diagnoses (including complications and co-morbidities):  Principal Problem:   NSTEMI (non-ST elevated myocardial infarction) ([place]) Active Problems:   Cardiomyopathy, ischemic   CAD S/P PCI mLAD DES (Promus 3.0 x 24 -- 3.25 mm)   Essential hypertension   Type 2 diabetes mellitus without complication, with long-term current use of insulin ([place])   ESRD on dialysis ([place])   Hyperkalemia   Cerebrovascular accident (CVA) due to embolism of precerebral artery ([place]) Atrial fibrillation Diastolic heart failure Acute liver injury  Brief Hospital Course (including significant findings, care, treatment, and services provided and events leading to death)  Amber Young is a 65 y.o. year old female who brought to the hospital for complaints of weakness, decreased appetite. Waxing and waning mental status with decreased oral intake. Noted to have elevated cardiac enzymes and diagnosed with non-ST elevation myocardial infarction.  Patient was scheduled for cardiac catheterization but was unable to go through the procedure, she was uncomfortable and not able to cooperate for the procedure.  Patient decompensated with worsening mental status with inability to protect airway, required intubation and mechanical ventilation and subsequently transferred to the intensive care unit.  CT scan of the head did reveal multiple embolic strokes.  Markedly elevated liver enzymes suggestive of hepatic congestion versus  hepatitis. CRRT started 8/31, MRI of the brain with multiple small infarcts in the left ACA, PICA territories suggestive of an embolic stroke Requiring pressors for blood pressure support Neurology consultation, cardiology consultation.  Started on antiepileptics Anticoagulation resumed following reviewing CT head that did not reveal hemorrhagic conversion of strokes. Patient remained in atrial fibrillation, continued on CRRT, antibiotics for leukocytosis-cultures did reveal 1 out of 8 cultures with Streptococcus-likely contaminant, antibiotics continued because of persistent leukocytosis. Patient did not require significant sedation for vent tolerance. Patient's neurological status remained static. Progression of illness with increasing pressor requirement. On 9 /5 patient noted to have loss of brainstem reflexes-discussed this with spouse CT scan ordered for follow-up showing extension of infarcts.  Patient sustained bradycardia leading to cardiorespiratory arrest, achieved ROSC after about 11 minutes Did have another cardiorespiratory arrest on following this, further discussion with the family regarding resuscitative efforts.  Transition to DNR status  Patient finally succumbed to her illness at 3 on 04-19-21  Cause of death: Non-ST elevation MI Multiple embolic strokes secondary to atrial fibrillation     Pertinent Labs and Studies  Significant Diagnostic Studies CT ABDOMEN PELVIS WO CONTRAST  Result Date: 04/19/2021 CLINICAL DATA:  Left lower quadrant pain. EXAM: CT ABDOMEN AND PELVIS WITHOUT CONTRAST TECHNIQUE: Multidetector CT imaging of the abdomen and pelvis was performed following the standard protocol without IV contrast. COMPARISON:  Abdomen pelvis 09/07/2020, ultrasound renal 06/16/2019. FINDINGS: Lower chest: Bronchial wall thickening and patchy airspace opacity at the left lung again noted. Left base atelectasis. Findings suggestive of anemia. Enlarged left ventricle.  Partially visualized central venous catheter terminating within the right atrium tiny hiatal hernia. Hepatobiliary: Similar-appearing nonspecific 0.8 cm hypodensity (3:23). Layering hyperdensity within the gallbladder lumen consistent with tiny gallstones. No gallbladder wall thickening  or pericholecystic fluid. No biliary dilatation. Pancreas: No focal lesion. Normal pancreatic contour. No surrounding inflammatory changes. No main pancreatic ductal dilatation. Spleen: Normal in size without focal abnormality. Adrenals/Urinary Tract: No adrenal nodule bilaterally. No nephrolithiasis and no hydronephrosis. There is a similar-appearing 2.4 x 1.6 x 3.5 cm No ureterolithiasis or hydroureter. The urinary bladder is unremarkable. Stomach/Bowel: Stomach is within normal limits. No evidence of bowel wall thickening or dilatation. Appendix appears normal. Vascular/Lymphatic: No abdominal aorta or iliac aneurysm. Severe atherosclerotic plaque of the aorta and its branches. No abdominal, pelvic, or inguinal lymphadenopathy. Reproductive: Status post hysterectomy. No adnexal masses. Other: No intraperitoneal free fluid. No intraperitoneal free gas. No organized fluid collection. Musculoskeletal: No abdominal wall hernia or abnormality. No suspicious lytic or blastic osseous lesions. No acute displaced fracture. IMPRESSION: 1. Cholelithiasis with no CT evidence of acute cholecystitis or choledocholithiasis. 2. Similar-appearing right parapelvic cyst. 3. No acute abnormality with limited evaluation on this noncontrast study. Electronically Signed   By: Iven Finn M.D.   On: 04/03/2021 19:08   CT ANGIO HEAD NECK W WO CM  Result Date: 04/11/2021 CLINICAL DATA:  Stroke suspected (Ped 0-18y) EXAM: CT ANGIOGRAPHY HEAD AND NECK TECHNIQUE: Multidetector CT imaging of the head and neck was performed using the standard protocol during bolus administration of intravenous contrast. Multiplanar CT image reconstructions and MIPs were  obtained to evaluate the vascular anatomy. Carotid stenosis measurements (when applicable) are obtained utilizing NASCET criteria, using the distal internal carotid diameter as the denominator. CONTRAST:  62m OMNIPAQUE IOHEXOL 350 MG/ML SOLN COMPARISON:  CT Head and MRI 04/09/2021 FINDINGS: CT HEAD FINDINGS Brain: Slightly increased edema associated with known acute infarcts in the left cerebellum and high parasagittal left parietal lobe. No progressive mass effect. No acute hemorrhage. No evidence of new/interval acute large vascular territory infarct. Remote infarcts versus dilated perivascular spaces in bilateral inferior basal ganglia, similar. Additional patchy white matter hypoattenuation, nonspecific but compatible with chronic microvascular ischemic disease. Mild to moderate generalized atrophy with ex vacuo ventricular dilation. No hydrocephalus. No mass lesion. No extra-axial fluid collection. Vascular: See below Skull: No acute fracture. Sinuses: Clear sinuses. Orbits: No acute finding. Review of the MIP images confirms the above findings CTA NECK FINDINGS Aortic arch: Atherosclerosis.  Great vessel origins are patent. Right carotid system: Patent common carotid artery without significant stenosis. Atherosclerosis at the carotid bifurcation without greater than 50% stenosis. Long segment narrowing of bilateral mid to distal internal carotid artery with luminal diameter of only 1-2 mm at the C1-C2 level. Left carotid system: Mixed calcific and noncalcific atherosclerosis of the common carotid artery with r up to 60-70% stenosis. Atherosclerosis at the carotid bifurcation without greater than 50% stenosis. Long segment narrowing of bilateral mid to distal internal carotid artery with luminal diameter of only 1-2 mm at the C1-C2 level. Vertebral arteries: Limited evaluation of the proximal vertebral arteries due to streak artifact from dense contrast in veins. Likely severe stenosis of the left vertebral  artery origin. Both vertebral arteries are patent more distally. Skeleton: Other neck: Upper chest: Biapical pleuroparenchymal scarring. Otherwise, lung apices are clear. Review of the MIP images confirms the above findings CTA HEAD FINDINGS Anterior circulation: Moderate atherosclerosis of bilateral internal carotid arteries. Moderate narrowing of the right paraclinoid ICA. Severe narrowing of the left paraclinoid ICA. Rounded filling defect within the left paraclinoid ICA, which could represent atherosclerotic plaque or intraluminal thrombus. Bilateral MCAs are patent. Mild stenosis of the right M1 MCA. Multifocal severe stenosis of a left  M2 MCA branch. Multifocal moderate stenosis of right M2 MCA branches. Occlusion of the left A1 ACA and multifocal severe stenosis of the left A2 ACA. Severe stenosis of the right A3 ACA. Posterior circulation: Moderate stenosis of bilateral proximal intradural vertebral arteries. Patent basilar artery. Patent bilateral posterior cerebral arteries. Multifocal severe P2 PCA stenosis bilaterally. Venous sinuses: Poorly evaluated due to arterial timing. Review of the MIP images confirms the above findings IMPRESSION: CTA head: 1. Occlusion of the left A1 ACA and multifocal severe stenosis of the left A2 and A3 ACA. Severe stenosis of the right A3 ACA. 2. Moderate right and severe left paraclinoid ICA stenosis. Rounded filling defect in the left paraclinoid ICA could represent atherosclerotic plaque and/or intraluminal thrombus. 3. Multifocal severe bilateral P2 PCA stenosis. 4. Multifocal severe stenosis of a left M2 MCA branch and multifocal mild to moderate right M2 MCA stenosis. 5. Moderate stenosis of bilateral proximal intradural vertebral arteries. CTA neck: 1. Approximately 60-70% stenosis of the left common carotid artery. 2. Limited evaluation of the proximal vertebral arteries with likely severe left vertebral artery origin stenosis. 3. Small and irregular internal carotid  arteries with long/smooth segment narrowing and luminal diameter of only a few mm bilaterally at C1-C2. CT head: 1. Slightly increased edema associated with known acute infarcts in the left cerebellum and high parasagittal left parietal lobe. No progressive mass effect. 2. Moderate chronic microvascular ischemic disease and mild to moderate atrophy. Findings discussed with Dr. Lucile Shutters via telephone at 7:24 p.m. Electronically Signed   By: Margaretha Sheffield M.D.   On: 04/11/2021 19:31   DG Chest 2 View  Result Date: 03/26/2021 CLINICAL DATA:  Weakness, decreased appetite, chest pain EXAM: CHEST - 2 VIEW COMPARISON:  Chest radiograph 09/07/2020 FINDINGS: A left-sided dialysis catheter is in place terminating over the right atrium. The heart is mildly enlarged. The mediastinal contours are within normal limits. Lung volumes are low. There is no focal consolidation or pulmonary edema. There is no pleural effusion or pneumothorax. The bones are unremarkable. A left axillary vascular stent is noted. IMPRESSION: Mild cardiomegaly and low lung volumes. Otherwise, no radiographic evidence of acute cardiopulmonary process. Electronically Signed   By: Valetta Mole M.D.   On: 03/16/2021 08:34   CT HEAD WO CONTRAST (5MM)  Result Date: 04/14/2021 CLINICAL DATA:  Stroke, follow-up; loss of brainstem functions, ? worsening edema. EXAM: CT HEAD WITHOUT CONTRAST TECHNIQUE: Contiguous axial images were obtained from the base of the skull through the vertex without intravenous contrast. COMPARISON:  Noncontrast head CT and CT angiogram head/neck 04/11/2021. brain MRI 04/09/2021. FINDINGS: Brain: Generalized cerebral and cerebellar atrophy. An acute/early subacute left ACA territory infarct has significantly progressed as compared to the head CT of 04/11/2021, now large and involving the medial left frontal and parietal lobes, and also likely portions of the corpus callosum on the left. No significant mass effect at this time. No  evidence of hemorrhagic conversion. Stable background supratentorial chronic small-vessel ischemic disease within the cerebral white matter. Unchanged small acute infarct within the left cerebellar hemisphere. Redemonstrated small chronic infarct within the right cerebellar hemisphere. No extra-axial fluid collection. No evidence of an intracranial mass. No midline shift. Vascular: No hyperdense vessel. Skull: No calvarial fracture or focal suspicious osseous lesion. Sinuses/Orbits: Visualized orbits show no acute finding. No significant paranasal sinus disease at the imaged levels. Other: Partially visualized nasoenteric tube. IMPRESSION: An acute/early subacute left ACA territory cortical/subcortical infarct has significantly progressed as compared to the head CT of 04/11/2021,  now large and involving the medial left frontal and parietal lobes, and also likely portions of the corpus callosum on the left. No significant mass effect at this time. No evidence of hemorrhagic conversion. Stable background supratentorial chronic small-vessel ischemic disease. Unchanged small acute infarct in the left cerebellar hemisphere. Redemonstrated small chronic infarct within the right cerebellar hemisphere. Generalized cerebral and cerebellar atrophy. Electronically Signed   By: Kellie Simmering D.O.   On: 04/14/2021 16:35   CT HEAD WO CONTRAST (5MM)  Result Date: 04/09/2021 CLINICAL DATA:  Mental status change, unknown cause. Intubated and unresponsive. EXAM: CT HEAD WITHOUT CONTRAST TECHNIQUE: Contiguous axial images were obtained from the base of the skull through the vertex without intravenous contrast. COMPARISON:  Prior head CT examinations 03/16/2021 and earlier. Brain MRI 06/17/2019. FINDINGS: Brain: Mild generalized cerebral and cerebellar atrophy. There are small infarcts within the bilateral cerebellar hemispheres which are new as compared to the brain MRI of 06/17/2019, but otherwise age-indeterminate. Redemonstrated  chronic lacunar infarcts versus prominent perivascular spaces within the bilateral basal ganglia (series 2, image 16). In retrospect, these infarcts were present on the head CT head 04/09/2021. Redemonstrated chronic lacunar infarct within the right thalamus. Moderate patchy and ill-defined hypoattenuation within the cerebral white matter, nonspecific but compatible with chronic small vessel ischemic disease. There is no acute intracranial hemorrhage. No demarcated cortical infarct. No extra-axial fluid collection. No evidence of an intracranial mass. No midline shift. Vascular: No hyperdense vessel.  Atherosclerotic calcifications. Skull: Normal. Negative for fracture or focal lesion. Sinuses/Orbits: Visualized orbits show no acute finding. No significant paranasal sinus disease at the imaged levels. IMPRESSION: Small infarcts within the bilateral cerebellar hemispheres are new as compared to the brain MRI of 06/17/2019, but otherwise age-indeterminate. Consider a brain MRI for further evaluation. Redemonstrated chronic lacunar infarcts versus prominent perivascular spaces within bilateral basal ganglia. Redemonstrated chronic lacunar infarct within the right thalamus. Moderate chronic small vessel ischemic changes within cerebral white matter. Mild generalized parenchymal atrophy. Electronically Signed   By: Kellie Simmering D.O.   On: 04/09/2021 08:50   CT HEAD WO CONTRAST (5MM)  Result Date: 03/25/2021 CLINICAL DATA:  Mental status change. EXAM: CT HEAD WITHOUT CONTRAST TECHNIQUE: Contiguous axial images were obtained from the base of the skull through the vertex without intravenous contrast. COMPARISON:  None. FINDINGS: Brain: No acute intracranial hemorrhage. No focal mass lesion. No CT evidence of acute infarction. No midline shift or mass effect. No hydrocephalus. Basilar cisterns are patent. Small lacunar infarction in the RIGHT basal ganglia (image 14/5. No change from prior. There are periventricular and  subcortical white matter hypodensities. Generalized cortical atrophy. Vascular: No hyperdense vessel or unexpected calcification. Skull: Normal. Negative for fracture or focal lesion. Sinuses/Orbits: Paranasal sinuses and mastoid air cells are clear. Orbits are clear. Other: None. IMPRESSION: 1. No acute intracranial findings. 2. Atrophy and chronic white matter microvascular disease Electronically Signed   By: Suzy Bouchard M.D.   On: 03/29/2021 19:32   MR BRAIN WO CONTRAST  Result Date: 04/09/2021 CLINICAL DATA:  Mental status change, unknown cause. EXAM: MRI HEAD WITHOUT CONTRAST TECHNIQUE: Multiplanar, multiecho pulse sequences of the brain and surrounding structures were obtained without intravenous contrast. COMPARISON:  Head CT April 09, 2021 FINDINGS: The study is partially degraded by motion. Brain: A 9 mm focus of restricted diffusion is seen in the left cerebellar hemisphere. Scattered foci of restricted diffusion are also seen within the deep white matter of the left frontal lobe, as well as within the medial  left frontal parietal cortex. No hemorrhage, hydrocephalus, extra-axial collection or mass lesion. Remote lacunar infarcts in the bilateral cerebellar hemispheres and right thalamus. Scattered and confluent foci of T2 hyperintensity are seen within the white matter of the cerebral hemispheres, nonspecific, most likely related to chronic small vessel ischemia. Moderate parenchymal volume loss. Focus of susceptibility artifact in the left amygdala suggesting hemosiderin deposit. Vascular: Normal flow voids. Skull and upper cervical spine: Normal marrow signal. Sinuses/Orbits: Negative. Other: None. IMPRESSION: 1. Multiple small acute/subacute infarcts in the left ACA and left PICA territories, suggesting embolic events. 2. Moderate chronic microvascular ischemic changes of the white matter and parenchymal. Electronically Signed   By: Pedro Earls M.D.   On: 04/09/2021 17:04    CARDIAC CATHETERIZATION  Result Date: 03/19/2021 Procedure was canceled at the patient's request   DG Chest [facility] 1 View  Result Date: 04/13/2021 CLINICAL DATA:  Respiratory failure EXAM: PORTABLE CHEST 1 VIEW COMPARISON:  Chest radiograph from one day prior. FINDINGS: Endotracheal tube tip is 4.3 cm above the carina. Insert stat right internal jugular central venous catheter terminates in the middle third of the SVC. Left internal jugular central venous catheter terminates at the cavoatrial junction. Stable cardiomediastinal silhouette with top-normal heart size. No pneumothorax. No pleural effusion. Lungs appear clear, with no acute consolidative airspace disease and no pulmonary edema. IMPRESSION: 1. Well-positioned support structures. No pneumothorax. 2. No active cardiopulmonary disease. Electronically Signed   By: Ilona Sorrel M.D.   On: 04/13/2021 07:37   DG CHEST PORT 1 VIEW  Result Date: 04/12/2021 CLINICAL DATA:  Respiratory failure EXAM: PORTABLE CHEST 1 VIEW COMPARISON:  04/10/2021 chest radiograph. FINDINGS: Endotracheal tube tip is 5.0 cm above the carina. Enteric tube enters stomach with the tip not seen on this image. Stable right internal jugular and left internal jugular central venous catheters. Stable cardiomediastinal silhouette with mild cardiomegaly. No pneumothorax. No pleural effusion. Lungs appear clear, with no acute consolidative airspace disease and no pulmonary edema. Stable left axillary vascular stent. IMPRESSION: Well-positioned support structures. Stable mild cardiomegaly without pulmonary edema. No active pulmonary disease. Electronically Signed   By: Ilona Sorrel M.D.   On: 04/12/2021 06:29   DG CHEST PORT 1 VIEW  Result Date: 04/10/2021 CLINICAL DATA:  65 year old female status post central line placement. EXAM: PORTABLE CHEST - 1 VIEW COMPARISON:  Earlier the same day FINDINGS: The mediastinal contours are within normal limits. No cardiomegaly. Interval insertion  of right internal jugular central venous catheter with the catheter tip in the superior vena cava. Unchanged position of the indwelling left internal jugular tunneled hemodialysis catheter with the tip in the right atrium, endotracheal tube with the tip in the midthoracic trachea, and enteric feeding tube courses off the inferior aspect of this image. The lungs are clear bilaterally without evidence of focal consolidation, pleural effusion, or pneumothorax. No acute osseous abnormality. IMPRESSION: No pneumothorax after placement of right internal jugular central line which appears appropriately positioned. Ruthann Cancer, MD [facility] Radiology Electronically Signed   By: Ruthann Cancer M.D.   On: 04/10/2021 14:59   DG Chest Port 1 View  Result Date: 04/10/2021 CLINICAL DATA:  Abnormal respiration EXAM: PORTABLE CHEST 1 VIEW COMPARISON:  Chest x-ray dated April 09, 2021 FINDINGS: Slight interval retraction of ET tube with tip positioned approximately 3.5 cm from the carina. Unchanged position of left central venous line. Enteric feeding tube partially seen coursing below the diaphragm. Cardiac and mediastinal contours are unchanged. Lungs  are clear. No large pleural effusion or evidence of pneumothorax. IMPRESSION: Lungs are clear. Electronically Signed   By: Yetta Glassman M.D.   On: 04/10/2021 10:46   DG CHEST PORT 1 VIEW  Result Date: 04/09/2021 CLINICAL DATA:  History of heart failure, intubated EXAM: PORTABLE CHEST 1 VIEW COMPARISON:  Chest radiograph 03/22/2021 FINDINGS: The endotracheal tube terminates approximately 1.7 cm from the carina. The left dialysis catheter terminates in the right atrium. Defibrillator pads and additional leads overlie the patient. The cardiomediastinal silhouette is stable, with unchanged cardiomegaly. There is no focal consolidation or pulmonary edema. There is no pleural effusion or pneumothorax. The bones are stable.  IMPRESSION: 1. Endotracheal tube terminates approximately 1.7 cm from the carina. 2. Unchanged cardiomegaly. 3. No radiographic evidence of acute cardiopulmonary process. Electronically Signed   By: Valetta Mole M.D.   On: 04/09/2021 10:31   DG Abd Portable 1V  Result Date: 04/09/2021 CLINICAL DATA:  Feeding tube placement EXAM: PORTABLE ABDOMEN - 1 VIEW COMPARISON:  CT scan 03/21/2021 FINDINGS: Feeding tube extends into the stomach and terminates in the gastric body. There is some residual contrast medium in the transverse colon. Extensive atherosclerotic vascular calcifications noted. IMPRESSION: 1. The feeding tube tip is in the stomach body. 2. Atherosclerosis. Electronically Signed   By: Van Clines M.D.   On: 04/09/2021 18:23   EEG adult  Result Date: 04/09/2021 Lora Havens, MD     04/09/2021 11:32 AM Patient Name: Pranati Clerkin MRN: [id number] Epilepsy Attending: Lora Havens Referring Physician/Provider: Dr Su Monks Date: 04/09/2021 Duration: 23.17 mins Patient history: 65yo F with altered mental status with questionable left arm seizure activity. EEG to evaluate for seizure Level of alertness: Awake AEDs during EEG study: None Technical aspects: This EEG study was done with scalp electrodes positioned according to the 10-20 International system of electrode placement. Electrical activity was acquired at a sampling rate of '500Hz'$  and reviewed with a high frequency filter of '70Hz'$  and a low frequency filter of '1Hz'$ . EEG data were recorded continuously and digitally stored. Description: EEG showed continuous generalized 3 to 6 Hz theta-delta slowing, at times with triphasic morphology.  Hyperventilation and photic stimulation were not performed.   ABNORMALITY - Continuous slow, generalized IMPRESSION: This study is suggestive of moderate diffuse encephalopathy, nonspecific etiology. No seizures or epileptiform discharges were seen throughout the recording. Priyanka [name]    Overnight EEG with video  Result Date: 04/10/2021 Lora Havens, MD     04/11/2021  8:41 AM Patient Name: Dalari Neumann MRN: [id number] Epilepsy Attending: Lora Havens Referring Physician/Provider: Payton Doughty, NP Duration: 04/09/2021 1925 to 04/10/2021 1925  Patient history: 65yo F with altered mental status with questionable left arm seizure activity. EEG to evaluate for seizure  Level of alertness: Awake, asleep  AEDs during EEG study: None  Technical aspects: This EEG study was done with scalp electrodes positioned according to the 10-20 International system of electrode placement. Electrical activity was acquired at a sampling rate of '500Hz'$  and reviewed with a high frequency filter of '70Hz'$  and a low frequency filter of '1Hz'$ . EEG data were recorded continuously and digitally stored.  Description: EEG showed continuous generalized 3 to 6 Hz theta-delta slowing.  Generalized periodic discharges with triphasic morphology at 1 to 1.5 Hz were also noted, predominantly when patient was awake/stimulated.  Hyperventilation and photic stimulation were not performed.    ABNORMALITY - Continuous slow, generalized - Periodic discharges with triphasic morphology, generalized  IMPRESSION: This study showed  generalized periodic discharges with triphasic morphology which is on the ictal-interictal continuum.  However, the morphology, frequency and reactivity to stimulation is more commonly seen due to toxic-metabolic causes.  There is also moderate diffuse encephalopathy, nonspecific etiology. No seizures were seen throughout the recording.  Lora Havens   ECHOCARDIOGRAM COMPLETE  Result Date: 04/09/2021    ECHOCARDIOGRAM REPORT   Patient Name:   MYSTIC RODD Date of Exam: 04/09/2021 Medical Rec #:  [id number]        Height:       67.0 in Accession #:    [id number]       Weight:       155.9 lb Date of Birth:  September 20, 1955        BSA:          1.819 m Patient Age:    86 years         BP:           103/67 mmHg  Patient Gender: F                HR:           49 bpm. Exam Location:  Inpatient Procedure: 2D Echo, Cardiac Doppler, Color Doppler and Intracardiac            Opacification Agent Indications:    122-I22.9 Subsequent ST elevation (STEM) and non-ST elevation                 (NSTEMI) myocardial infarction  History:        Patient has prior history of Echocardiogram examinations, most                 recent 07/01/2019. Cardiomyopathy, Previous Myocardial                 Infarction, Acute MI and CAD, Abnormal ECG, Stroke,                 Arrythmias:Atrial Fibrillation,                 Signs/Symptoms:Dizziness/Lightheadedness and Dyspnea; Risk                 Factors:Hypertension. ESRD.  Sonographer:    Roseanna Rainbow RDCS Referring Phys: [id number] [place]  1. Left ventricular ejection fraction, by estimation, is 25 to 30%. The left ventricle has severely decreased function. The left ventricle demonstrates global hypokinesis. There is moderate left ventricular hypertrophy. Left ventricular diastolic function could not be evaluated. Elevated left ventricular end-diastolic pressure. There is akinesis of the left ventricular, mid-apical septal wall.  2. Right ventricular systolic function is mildly reduced. The right ventricular size is normal.  3. Left atrial size was moderately dilated.  4. Right atrial size was mildly dilated.  5. The mitral valve is abnormal. Mild mitral valve regurgitation. No evidence of mitral stenosis. Moderate to severe mitral annular calcification.  6. The aortic valve is grossly normal. There is mild calcification of the aortic valve. There is mild thickening of the aortic valve. Aortic valve regurgitation is not visualized. Mild to moderate aortic valve sclerosis/calcification is present, without  any evidence of aortic stenosis. Comparison(s): Prior images reviewed side by side. Changes from prior study are noted. Conclusion(s)/Recommendation(s): Rhythm changed between sinus  bradycardia and rate controlled atrial fibrillation during study. Overall, compared to prior the pattern of wall motion abnormalities is similar, but they are more pronounced on current study,  with slight decrease in EF from prior. Findings communicated with  NP Reino Bellis. FINDINGS  Left Ventricle: Global hypokinesis with focal akinesis of the mid to distal anterior and inferior septum. Left ventricular ejection fraction, by estimation, is 25 to 30%. The left ventricle has severely decreased function. The left ventricle demonstrates global hypokinesis. The left ventricular internal cavity size was normal in size. There is moderate left ventricular hypertrophy. Left ventricular diastolic function could not be evaluated due to atrial fibrillation. Left ventricular diastolic function could not be evaluated. Elevated left ventricular end-diastolic pressure. Right Ventricle: The right ventricular size is normal. Right vetricular wall thickness was not well visualized. Right ventricular systolic function is mildly reduced. Left Atrium: Left atrial size was moderately dilated. Right Atrium: Right atrial size was mildly dilated. Pericardium: There is no evidence of pericardial effusion. Mitral Valve: The mitral valve is abnormal. There is mild thickening of the mitral valve leaflet(s). There is mild calcification of the mitral valve leaflet(s). Moderate to severe mitral annular calcification. Mild mitral valve regurgitation. No evidence  of mitral valve stenosis. Tricuspid Valve: The tricuspid valve is normal in structure. Tricuspid valve regurgitation is trivial. No evidence of tricuspid stenosis. Aortic Valve: The aortic valve is grossly normal. There is mild calcification of the aortic valve. There is mild thickening of the aortic valve. There is moderate aortic valve annular calcification. Aortic valve regurgitation is not visualized. Mild to moderate aortic valve sclerosis/calcification is present, without any  evidence of aortic stenosis. Pulmonic Valve: The pulmonic valve was not well visualized. Pulmonic valve regurgitation is not visualized. No evidence of pulmonic stenosis. Aorta: The aortic root, ascending aorta and aortic arch are all structurally normal, with no evidence of dilitation or obstruction. Venous: IVC assessment for right atrial pressure unable to be performed due to mechanical ventilation. IAS/Shunts: The interatrial septum appears to be lipomatous. The atrial septum is grossly normal.  LEFT VENTRICLE PLAX 2D LVIDd:         4.80 cm      Diastology LVIDs:         4.57 cm      LV e' medial:    2.13 cm/s LV PW:         1.80 cm      LV E/e' medial:  46.1 LV IVS:        1.20 cm      LV e' lateral:   2.13 cm/s LVOT diam:     1.90 cm      LV E/e' lateral: 46.1 LV SV:         40 LV SV Index:   22 LVOT Area:     2.84 cm  LV Volumes (MOD) LV vol d, MOD A2C: 97.9 ml LV vol d, MOD A4C: 115.3 ml LV vol s, MOD A2C: 65.4 ml LV vol s, MOD A4C: 69.9 ml LV SV MOD A2C:     32.5 ml LV SV MOD A4C:     115.3 ml LV SV MOD BP:      39.0 ml RIGHT VENTRICLE            IVC RV S prime:     5.70 cm/s  IVC diam: 1.90 cm TAPSE (M-mode): 1.5 cm LEFT ATRIUM             Index       RIGHT ATRIUM           Index LA diam:        3.40 cm 1.87 cm/m  RA Area:     15.70 cm LA Vol (  A2C):   69.9 ml 38.43 ml/m RA Volume:   44.20 ml  24.30 ml/m LA Vol (A4C):   44.6 ml 24.52 ml/m LA Biplane Vol: 56.4 ml 31.01 ml/m  AORTIC VALVE LVOT Vmax:   70.70 cm/s LVOT Vmean:  39.200 cm/s LVOT VTI:    0.140 m  AORTA Ao Root diam: 3.10 cm Ao Asc diam:  3.00 cm MITRAL VALVE MV Area (PHT): 3.31 cm    SHUNTS MV Decel Time: 229 msec    Systemic VTI:  0.14 m MV E velocity: 98.10 cm/s  Systemic Diam: 1.90 cm Buford Dresser MD Electronically signed by Buford Dresser MD Signature Date/Time: 04/09/2021/6:24:41 PM    Final    US LIVER DOPPLER  Result Date: 04/10/2021 CLINICAL DATA:  Transaminitis EXAM: DUPLEX ULTRASOUND OF LIVER TECHNIQUE: Color  and duplex Doppler ultrasound was performed to evaluate the hepatic in-flow and out-flow vessels. COMPARISON:  CT 03/31/2021 FINDINGS: Liver: Normal parenchymal echogenicity. Normal hepatic contour without nodularity. No focal lesion, mass or intrahepatic biliary ductal dilatation. Main Portal Vein size: 0.9 cm Portal Vein Velocities (all hepatopetal): Main Prox:  20 cm/sec Main Mid: 14 cm/sec Main Dist:  11 cm/sec Right: 9 cm/sec Left: 9 cm/sec Hepatic Vein Velocities (all hepatofugal): Right:  9 cm/sec Middle:  10 cm/sec Left:  15 cm/sec IVC: Present and patent with normal respiratory phasicity. Hepatic Artery Velocity:  106 cm/sec Splenic Vein Velocity:  12 cm/sec Spleen: 8.8 cm x 3.9 cm x 3.5 cm with a total volume of 62 cm^3 (411 cm^3 is upper limit normal) Portal Vein Occlusion/Thrombus: No Splenic Vein Occlusion/Thrombus: No Ascites: None Varices: None IMPRESSION: Unremarkable hepatic vascular Doppler evaluation. Electronically Signed   By: Lucrezia Europe M.D.   On: 04/10/2021 08:12   US ABDOMEN LIMITED RUQ (LIVER/GB)  Result Date: 04/06/2021 CLINICAL DATA:  Elevated liver function tests. EXAM: ULTRASOUND ABDOMEN LIMITED RIGHT UPPER QUADRANT COMPARISON:  April 07, 2021. FINDINGS: Gallbladder: Sludge and cholelithiasis is noted. No gallbladder wall thickening or pericholecystic fluid is noted. No sonographic Murphy's sign is noted. Common bile duct: Diameter: 4 mm which is within normal limits. Liver: No focal lesion identified. Within normal limits in parenchymal echogenicity. Portal vein is patent on color Doppler imaging with normal direction of blood flow towards the liver. Other: Right renal cyst is noted. IMPRESSION: Sludge and cholelithiasis is noted. No other significant abnormality seen in the right upper quadrant of the abdomen. Electronically Signed   By: Marijo Conception M.D.   On: 03/15/2021 19:57    Microbiology Recent Results (from the past 240 hour(s))  Culture, blood (Routine X 2) w Reflex  to ID Panel     Status: None   Collection Time: 04/13/21 11:29 AM   Specimen: BLOOD  Result Value Ref Range Status   Specimen Description BLOOD BLOOD RIGHT HAND  Final   Special Requests   Final    AEROBIC BOTTLE ONLY Blood Culture results may not be optimal due to an inadequate volume of blood received in culture bottles   Culture   Final    NO GROWTH 5 DAYS Performed at [facility], [place] [address], [place], [place] [address]    Report Status 04/18/2021 FINAL  Final  Culture, blood (Routine X 2) w Reflex to ID Panel     Status: None   Collection Time: 04/13/21 11:47 AM   Specimen: BLOOD  Result Value Ref Range Status   Specimen Description BLOOD BLOOD RIGHT WRIST  Final   Special Requests  Final    AEROBIC BOTTLE ONLY Blood Culture results may not be optimal due to an inadequate volume of blood received in culture bottles   Culture   Final    NO GROWTH 5 DAYS Performed at [facility], [place] [address], [place], [place] [address]    Report Status 04/18/2021 FINAL  Final    Lab Basic Metabolic Panel: Recent Labs  Lab 04/14/21 1627 04/14/21 1852 04/14/21 2254 2021/04/23 0330  NA 134* 147* 149* 144  K 5.3* 6.1* 4.9 5.3*  CL 102 99  --  102  CO2 17* 25  --  21*  GLUCOSE 150* 122*  --  72  BUN 20 18  --  41*  CREATININE 1.19* 1.21*  --  2.23*  CALCIUM 9.0 7.9*  --  9.3  MG  --  2.7*  --  3.9*  PHOS 4.0 5.0*  --   --    Liver Function Tests: Recent Labs  Lab 04/14/21 1627 04/14/21 1852 04-23-2021 0330  AST  --  838* 1,659*  ALT  --  761* 923*  ALKPHOS  --  223* 252*  BILITOT  --  1.8* 3.2*  PROT  --  5.1* 5.8*  ALBUMIN 2.4* 1.9* 2.1*   No results for input(s): LIPASE, AMYLASE in the last 168 hours. No results for input(s): AMMONIA in the last 168 hours. CBC: Recent Labs  Lab 04/14/21 1852 04/14/21 2254 04/23/2021 0330  WBC 44.7*  --  37.7*  NEUTROABS 19.7*  --   --   HGB 10.0* 8.8* 11.5*  HCT 31.5* 26.0* 35.5*  MCV 86.1  --  84.9  PLT 159  --   184   Cardiac Enzymes: No results for input(s): CKTOTAL, CKMB, CKMBINDEX, TROPONINI in the last 168 hours. Sepsis Labs: Recent Labs  Lab 04/14/21 1852 04/23/2021 0330  WBC 44.7* 37.7*    Procedures/Operations     Simonne Boulos A Benzion Mesta 04/21/2021, 8:58 AM

## 2021-05-10 NOTE — Progress Notes (Signed)
Patient cardiac arrested this shift and was made a DNR. She remained on triple pressors, intubated and ventilated. Patient again became bradycardic at 0540. She had no palpable pulse, then without cardiac rhythm on the monitor, asystole, pupillary reflexes and breath sounds. Time of death 34. Elink, CDS notified. Awaiting call back from next of kin Kyasha Wissel, husband.   Sheppard Penton RN

## 2021-05-10 NOTE — Progress Notes (Signed)
CDS contacted.  Also attempted to call pts husband Marsi Bobinski [phone] without a response. Left a general message, awaiting call back.    Sheppard Penton RN

## 2021-05-10 DEATH — deceased

## 2021-05-12 ENCOUNTER — Telehealth: Payer: Self-pay | Admitting: Pulmonary Disease

## 2021-05-12 NOTE — Telephone Encounter (Signed)
Patient's husband, Scottlynn Nusser, dropped off Proof of Death form to [place] office for [place] life ins.  I called him and he said he has already spoken to Dr. Loanne Drilling about signing this form.  He provided the policy# [id number] and claim# [id number].  Per Mr. Fry the Nurse, learning disability in [place] would not sign the form because they do not have the appropriate license. I prepared the form and sent it back for Dr. Cordelia Pen signature.  Mr. Swatsworth will come pick it up when it is completed. Ph# [phone].

## 2021-05-13 NOTE — Telephone Encounter (Signed)
I do not recall this conversation and there is no documentation that I spoke with family. On review of records, I was not personally involved in patient's care.  Based on medical records, the [facility] Pulmonary team cared for patient in ICU. Was able to verify date/time and cause of death. Proof of Death form signed and ready for pick-up.

## 2021-05-13 NOTE — Telephone Encounter (Signed)
Since Dr. Loanne Drilling did not actually talk to this patient's family, I reprinted the Proof of Death form for Dr. Judson Roch signature.  He saw the patient in the hospital and completed the death summary.  He signed the form and Maryann Conners witnessed it.  I called and left VM for patient's husband, Cyriah Weinand, to pick the form up in the [place] office
